# Patient Record
Sex: Male | Born: 1960 | Race: Black or African American | Hispanic: No | Smoking: Former smoker
Health system: Southern US, Community
[De-identification: ages and names within clinical notes are randomized; demographics above are authoritative.]

## PROBLEM LIST (undated history)

## (undated) DIAGNOSIS — Z803 Family history of malignant neoplasm of breast: Secondary | ICD-10-CM

## (undated) DIAGNOSIS — I219 Acute myocardial infarction, unspecified: Secondary | ICD-10-CM

## (undated) DIAGNOSIS — D649 Anemia, unspecified: Secondary | ICD-10-CM

## (undated) DIAGNOSIS — I10 Essential (primary) hypertension: Secondary | ICD-10-CM

## (undated) DIAGNOSIS — C61 Malignant neoplasm of prostate: Secondary | ICD-10-CM

## (undated) DIAGNOSIS — E119 Type 2 diabetes mellitus without complications: Secondary | ICD-10-CM

## (undated) HISTORY — PX: DENTAL SURGERY: SHX609

## (undated) HISTORY — DX: Malignant neoplasm of prostate: C61

## (undated) HISTORY — DX: Family history of malignant neoplasm of breast: Z80.3

---

## 1985-05-02 DIAGNOSIS — E119 Type 2 diabetes mellitus without complications: Secondary | ICD-10-CM

## 1985-05-02 HISTORY — DX: Type 2 diabetes mellitus without complications: E11.9

## 2009-04-19 ENCOUNTER — Emergency Department (HOSPITAL_COMMUNITY): Admission: EM | Admit: 2009-04-19 | Discharge: 2009-04-19 | Payer: Self-pay | Admitting: Emergency Medicine

## 2010-03-30 ENCOUNTER — Emergency Department (HOSPITAL_COMMUNITY): Admission: EM | Admit: 2010-03-30 | Discharge: 2010-03-31 | Payer: Self-pay | Admitting: Emergency Medicine

## 2010-07-14 LAB — POCT I-STAT, CHEM 8
Calcium, Ion: 1.14 mmol/L (ref 1.12–1.32)
Creatinine, Ser: 1.3 mg/dL (ref 0.4–1.5)
Glucose, Bld: 139 mg/dL — ABNORMAL HIGH (ref 70–99)
Hemoglobin: 15.6 g/dL (ref 13.0–17.0)
TCO2: 27 mmol/L (ref 0–100)

## 2020-05-03 ENCOUNTER — Emergency Department (HOSPITAL_COMMUNITY)
Admission: EM | Admit: 2020-05-03 | Discharge: 2020-05-03 | Disposition: A | Discharge status: Home / Self Care | Payer: BC Managed Care – PPO | Attending: Emergency Medicine | Admitting: Emergency Medicine

## 2020-05-03 ENCOUNTER — Other Ambulatory Visit: Payer: Self-pay

## 2020-05-03 ENCOUNTER — Encounter (HOSPITAL_COMMUNITY): Payer: Self-pay | Admitting: Emergency Medicine

## 2020-05-03 DIAGNOSIS — R109 Unspecified abdominal pain: Secondary | ICD-10-CM | POA: Diagnosis not present

## 2020-05-03 DIAGNOSIS — Z20822 Contact with and (suspected) exposure to covid-19: Secondary | ICD-10-CM | POA: Diagnosis not present

## 2020-05-03 DIAGNOSIS — I10 Essential (primary) hypertension: Secondary | ICD-10-CM | POA: Insufficient documentation

## 2020-05-03 DIAGNOSIS — R112 Nausea with vomiting, unspecified: Secondary | ICD-10-CM | POA: Diagnosis not present

## 2020-05-03 DIAGNOSIS — R739 Hyperglycemia, unspecified: Secondary | ICD-10-CM | POA: Insufficient documentation

## 2020-05-03 DIAGNOSIS — R000 Tachycardia, unspecified: Secondary | ICD-10-CM | POA: Diagnosis not present

## 2020-05-03 HISTORY — DX: Essential (primary) hypertension: I10

## 2020-05-03 LAB — URINALYSIS, ROUTINE W REFLEX MICROSCOPIC
Bacteria, UA: NONE SEEN
Bilirubin Urine: NEGATIVE
Glucose, UA: >=500 mg/dL — AB
Hgb urine dipstick: NEGATIVE
Ketones, ur: 80 mg/dL — AB
Leukocytes,Ua: NEGATIVE
Nitrite: NEGATIVE
Protein, ur: NEGATIVE mg/dL
Specific Gravity, Urine: 1.022 (ref 1.005–1.030)
pH: 6 (ref 5.0–8.0)

## 2020-05-03 LAB — COMPREHENSIVE METABOLIC PANEL
ALT: 21 U/L (ref 0–44)
AST: 15 U/L (ref 15–41)
Albumin: 3.1 g/dL — ABNORMAL LOW (ref 3.5–5.0)
Alkaline Phosphatase: 35 U/L — ABNORMAL LOW (ref 38–126)
Anion gap: 10 (ref 5–15)
BUN: 10 mg/dL (ref 6–20)
CO2: 24 mmol/L (ref 22–32)
Calcium: 8.8 mg/dL — ABNORMAL LOW (ref 8.9–10.3)
Chloride: 97 mmol/L — ABNORMAL LOW (ref 98–111)
Creatinine, Ser: 0.86 mg/dL (ref 0.61–1.24)
GFR, Estimated: >60 mL/min (ref >60)
Glucose, Bld: 261 mg/dL — ABNORMAL HIGH (ref 70–99)
Potassium: 4.3 mmol/L (ref 3.5–5.1)
Sodium: 131 mmol/L — ABNORMAL LOW (ref 135–145)
Total Bilirubin: 0.6 mg/dL (ref 0.3–1.2)
Total Protein: 7.4 g/dL (ref 6.5–8.1)

## 2020-05-03 LAB — CBC
HCT: 38.8 % — ABNORMAL LOW (ref 39.0–52.0)
Hemoglobin: 12.9 g/dL — ABNORMAL LOW (ref 13.0–17.0)
MCH: 28.5 pg (ref 26.0–34.0)
MCHC: 33.2 g/dL (ref 30.0–36.0)
MCV: 85.8 fL (ref 80.0–100.0)
Platelets: 434 10*3/uL — ABNORMAL HIGH (ref 150–400)
RBC: 4.52 MIL/uL (ref 4.22–5.81)
RDW: 12.2 % (ref 11.5–15.5)
WBC: 14.1 10*3/uL — ABNORMAL HIGH (ref 4.0–10.5)
nRBC: 0 % (ref 0.0–0.2)

## 2020-05-03 LAB — LIPASE, BLOOD: Lipase: 18 U/L (ref 11–51)

## 2020-05-03 LAB — POC SARS CORONAVIRUS 2 AG -  ED: SARS Coronavirus 2 Ag: NEGATIVE

## 2020-05-03 MED ORDER — PROMETHAZINE HCL 25 MG/ML IJ SOLN
25.0000 mg | Freq: Once | INTRAMUSCULAR | Status: AC
  Administered 2020-05-03: 25 mg via INTRAVENOUS
  Filled 2020-05-03: qty 1

## 2020-05-03 MED ORDER — ONDANSETRON 8 MG PO TBDP: 8.0000 mg | ORAL_TABLET | Freq: Three times a day (TID) | ORAL | 0 refills | Status: DC | PRN

## 2020-05-03 MED ORDER — ONDANSETRON 4 MG PO TBDP
4.0000 mg | ORAL_TABLET | Freq: Once | ORAL | Status: AC | PRN
  Administered 2020-05-03: 4 mg via ORAL

## 2020-05-03 MED ORDER — SODIUM CHLORIDE 0.9 % IV BOLUS
2000.0000 mL | Freq: Once | INTRAVENOUS | Status: AC
  Administered 2020-05-03: 2000 mL via INTRAVENOUS

## 2020-05-03 MED ORDER — METFORMIN HCL 500 MG PO TABS: 500.0000 mg | ORAL_TABLET | Freq: Two times a day (BID) | ORAL | 0 refills | Status: DC

## 2020-05-03 NOTE — ED Provider Notes (Signed)
[facility]-EMERGENCY DEPT Provider Note   CSN: [id number] Arrival date & time: 05/03/20  1125     History Chief Complaint  Patient presents with  . Abdominal Pain  . Emesis    Derek Thompson is a 60 y.o. male.  HPI 60 year old male presents today complaining of nausea and vomiting.  He states he has vomited multiple times since Friday evening.  Every time he attempts to eat he is vomiting.  He denies any dark emesis or bright red blood.  Patient states that on Tuesday and Wednesday he had some nasal congestion, cough, and scratchy throat.  He did not have a fever.  He felt better on Thursday that he continued to feel fatigued on Thursday and Friday.  Vomiting began Friday evening.  He denies any abdominal pain, fever, or diarrhea.  He has not had similar symptoms in the past.  He has not taken any interventions.  Denies any base at worst is eating.  He has not drank alcohol for the past 7 years.    Past Medical History:  Diagnosis Date  . Hypertension     There are no problems to display for this patient.   History reviewed. No pertinent surgical history.     History reviewed. No pertinent family history.  Social History   Tobacco Use  . Smoking status: Never Smoker  . Smokeless tobacco: Never Used  Substance Use Topics  . Alcohol use: Not Currently  . Drug use: Not Currently    Home Medications Prior to Admission medications   Not on File    Allergies    Patient has no known allergies.  Review of Systems   Review of Systems  All other systems reviewed and are negative.   Physical Exam Updated Vital Signs BP (!) 166/99 (BP Location: Left Arm)   Pulse (!) 115   Temp 99.2 F (37.3 C) (Oral)   Resp 16   Ht 1.829 m (6')   Wt 86.2 kg   SpO2 93%   BMI 25.77 kg/m   Physical Exam Constitutional:      Appearance: He is well-developed.  HENT:     Head: Normocephalic and atraumatic.     Mouth/Throat:     Mouth: Mucous membranes are  moist.     Pharynx: Oropharynx is clear.  Eyes:     Extraocular Movements: Extraocular movements intact.  Cardiovascular:     Rate and Rhythm: Regular rhythm. Tachycardia present.     Heart sounds: Normal heart sounds.  Pulmonary:     Effort: Pulmonary effort is normal.  Abdominal:     General: Abdomen is flat. Bowel sounds are normal.     Palpations: Abdomen is soft.     Tenderness: There is no abdominal tenderness.  Skin:    General: Skin is warm and dry.     Capillary Refill: Capillary refill takes less than 2 seconds.  Neurological:     General: No focal deficit present.     Mental Status: He is alert.  Psychiatric:        Mood and Affect: Mood normal.     ED Results / Procedures / Treatments   Labs (all labs ordered are listed, but only abnormal results are displayed) Labs Reviewed  COMPREHENSIVE METABOLIC PANEL - Abnormal; Notable for the following components:      Result Value   Sodium 131 (*)    Chloride 97 (*)    Glucose, Bld 261 (*)    Calcium 8.8 (*)  Albumin 3.1 (*)    Alkaline Phosphatase 35 (*)    All other components within normal limits  CBC - Abnormal; Notable for the following components:   WBC 14.1 (*)    Hemoglobin 12.9 (*)    HCT 38.8 (*)    Platelets 434 (*)    All other components within normal limits  URINALYSIS, ROUTINE W REFLEX MICROSCOPIC - Abnormal; Notable for the following components:   Glucose, UA >=500 (*)    Ketones, ur 80 (*)    All other components within normal limits  LIPASE, BLOOD  POC SARS CORONAVIRUS 2 AG -  ED    EKG None  Radiology No results found.  Procedures Procedures (including critical care time)  Medications Ordered in ED Medications  sodium chloride 0.9 % bolus 2,000 mL (has no administration in time range)  promethazine (PHENERGAN) injection 25 mg (has no administration in time range)  ondansetron (ZOFRAN-ODT) disintegrating tablet 4 mg (4 mg Oral Given 05/03/20 1139)    ED Course  I have reviewed  the triage vital signs and the nursing notes.  Pertinent labs & imaging results that were available during my care of the patient were reviewed by me and considered in my medical decision making (see chart for details).    MDM Rules/Calculators/A&P                          59 yo male with nausea and vomiting presents with tachycardia but no other s/s on exam.  Abdomen soft and nttp.  Labs essentially normal except hyperglycemia  Patient reports he was on oral meds for t2dm and hypertension but has not taken them for some time.  Will d/c with rx for antiemetics, metformin and advised re f/u for nausea and vomiting if any worsening, dm and hypertension.  HR down to 110.  Patient tolerating po.  Discussed return precautions and need for follow-up and patient voiced understanding Final Clinical Impression(s) / ED Diagnoses Final diagnoses:  Nausea and vomiting, intractability of vomiting not specified, unspecified vomiting type  Hyperglycemia  Hypertension, unspecified type    Rx / DC Orders ED Discharge Orders    None       Pattricia Boss, MD 05/03/20 2208

## 2020-05-03 NOTE — ED Triage Notes (Signed)
Patient states he has had abdominal pain, fatigue and emesis x2-3 days. Denies body aches, or fevers. Endorses small loose stools, but decreased oral intake.

## 2020-05-22 ENCOUNTER — Other Ambulatory Visit: Payer: Self-pay

## 2020-05-22 ENCOUNTER — Emergency Department (HOSPITAL_BASED_OUTPATIENT_CLINIC_OR_DEPARTMENT_OTHER): Payer: BC Managed Care – PPO

## 2020-05-22 ENCOUNTER — Encounter (HOSPITAL_BASED_OUTPATIENT_CLINIC_OR_DEPARTMENT_OTHER): Payer: Self-pay

## 2020-05-22 ENCOUNTER — Inpatient Hospital Stay (HOSPITAL_BASED_OUTPATIENT_CLINIC_OR_DEPARTMENT_OTHER)
Admission: EM | Admit: 2020-05-22 | Discharge: 2020-05-26 | DRG: 286 | Disposition: A | Discharge status: Home / Self Care | Payer: BC Managed Care – PPO | Attending: Internal Medicine | Admitting: Internal Medicine

## 2020-05-22 DIAGNOSIS — J90 Pleural effusion, not elsewhere classified: Secondary | ICD-10-CM

## 2020-05-22 DIAGNOSIS — I471 Supraventricular tachycardia: Secondary | ICD-10-CM | POA: Diagnosis present

## 2020-05-22 DIAGNOSIS — R0600 Dyspnea, unspecified: Principal | ICD-10-CM

## 2020-05-22 DIAGNOSIS — I5041 Acute combined systolic (congestive) and diastolic (congestive) heart failure: Secondary | ICD-10-CM | POA: Diagnosis present

## 2020-05-22 DIAGNOSIS — E1159 Type 2 diabetes mellitus with other circulatory complications: Secondary | ICD-10-CM | POA: Diagnosis not present

## 2020-05-22 DIAGNOSIS — Z23 Encounter for immunization: Secondary | ICD-10-CM | POA: Diagnosis not present

## 2020-05-22 DIAGNOSIS — I501 Left ventricular failure: Secondary | ICD-10-CM | POA: Diagnosis present

## 2020-05-22 DIAGNOSIS — I110 Hypertensive heart disease with heart failure: Secondary | ICD-10-CM | POA: Diagnosis present

## 2020-05-22 DIAGNOSIS — R0609 Other forms of dyspnea: Secondary | ICD-10-CM

## 2020-05-22 DIAGNOSIS — I5023 Acute on chronic systolic (congestive) heart failure: Secondary | ICD-10-CM

## 2020-05-22 DIAGNOSIS — Z7984 Long term (current) use of oral hypoglycemic drugs: Secondary | ICD-10-CM

## 2020-05-22 DIAGNOSIS — R0602 Shortness of breath: Secondary | ICD-10-CM

## 2020-05-22 DIAGNOSIS — F1021 Alcohol dependence, in remission: Secondary | ICD-10-CM | POA: Diagnosis present

## 2020-05-22 DIAGNOSIS — E441 Mild protein-calorie malnutrition: Secondary | ICD-10-CM | POA: Diagnosis present

## 2020-05-22 DIAGNOSIS — I5021 Acute systolic (congestive) heart failure: Secondary | ICD-10-CM | POA: Diagnosis not present

## 2020-05-22 DIAGNOSIS — J9601 Acute respiratory failure with hypoxia: Secondary | ICD-10-CM | POA: Diagnosis present

## 2020-05-22 DIAGNOSIS — I509 Heart failure, unspecified: Secondary | ICD-10-CM

## 2020-05-22 DIAGNOSIS — I5022 Chronic systolic (congestive) heart failure: Secondary | ICD-10-CM

## 2020-05-22 DIAGNOSIS — I447 Left bundle-branch block, unspecified: Secondary | ICD-10-CM | POA: Diagnosis present

## 2020-05-22 DIAGNOSIS — Z20822 Contact with and (suspected) exposure to covid-19: Secondary | ICD-10-CM | POA: Diagnosis present

## 2020-05-22 DIAGNOSIS — Z87891 Personal history of nicotine dependence: Secondary | ICD-10-CM | POA: Diagnosis not present

## 2020-05-22 DIAGNOSIS — I2510 Atherosclerotic heart disease of native coronary artery without angina pectoris: Secondary | ICD-10-CM | POA: Diagnosis present

## 2020-05-22 DIAGNOSIS — I5042 Chronic combined systolic (congestive) and diastolic (congestive) heart failure: Secondary | ICD-10-CM

## 2020-05-22 DIAGNOSIS — E861 Hypovolemia: Secondary | ICD-10-CM | POA: Diagnosis not present

## 2020-05-22 DIAGNOSIS — E871 Hypo-osmolality and hyponatremia: Secondary | ICD-10-CM | POA: Diagnosis not present

## 2020-05-22 DIAGNOSIS — E1165 Type 2 diabetes mellitus with hyperglycemia: Secondary | ICD-10-CM

## 2020-05-22 DIAGNOSIS — E1100 Type 2 diabetes mellitus with hyperosmolarity without nonketotic hyperglycemic-hyperosmolar coma (NKHHC): Secondary | ICD-10-CM

## 2020-05-22 DIAGNOSIS — Z6824 Body mass index (BMI) 24.0-24.9, adult: Secondary | ICD-10-CM | POA: Diagnosis not present

## 2020-05-22 DIAGNOSIS — Z800 Family history of malignant neoplasm of digestive organs: Secondary | ICD-10-CM | POA: Diagnosis not present

## 2020-05-22 DIAGNOSIS — I428 Other cardiomyopathies: Secondary | ICD-10-CM | POA: Diagnosis not present

## 2020-05-22 DIAGNOSIS — I5031 Acute diastolic (congestive) heart failure: Secondary | ICD-10-CM

## 2020-05-22 DIAGNOSIS — I10 Essential (primary) hypertension: Secondary | ICD-10-CM | POA: Diagnosis not present

## 2020-05-22 DIAGNOSIS — D638 Anemia in other chronic diseases classified elsewhere: Secondary | ICD-10-CM | POA: Diagnosis present

## 2020-05-22 DIAGNOSIS — I5020 Unspecified systolic (congestive) heart failure: Secondary | ICD-10-CM | POA: Diagnosis not present

## 2020-05-22 DIAGNOSIS — E876 Hypokalemia: Secondary | ICD-10-CM | POA: Diagnosis not present

## 2020-05-22 DIAGNOSIS — E118 Type 2 diabetes mellitus with unspecified complications: Secondary | ICD-10-CM | POA: Diagnosis not present

## 2020-05-22 DIAGNOSIS — R55 Syncope and collapse: Secondary | ICD-10-CM | POA: Diagnosis not present

## 2020-05-22 DIAGNOSIS — I472 Ventricular tachycardia: Secondary | ICD-10-CM | POA: Diagnosis not present

## 2020-05-22 DIAGNOSIS — I420 Dilated cardiomyopathy: Secondary | ICD-10-CM | POA: Diagnosis not present

## 2020-05-22 DIAGNOSIS — R0902 Hypoxemia: Secondary | ICD-10-CM

## 2020-05-22 DIAGNOSIS — E119 Type 2 diabetes mellitus without complications: Secondary | ICD-10-CM | POA: Diagnosis present

## 2020-05-22 DIAGNOSIS — E785 Hyperlipidemia, unspecified: Secondary | ICD-10-CM | POA: Diagnosis not present

## 2020-05-22 DIAGNOSIS — Z803 Family history of malignant neoplasm of breast: Secondary | ICD-10-CM

## 2020-05-22 DIAGNOSIS — I152 Hypertension secondary to endocrine disorders: Secondary | ICD-10-CM | POA: Diagnosis not present

## 2020-05-22 DIAGNOSIS — E1169 Type 2 diabetes mellitus with other specified complication: Secondary | ICD-10-CM | POA: Diagnosis not present

## 2020-05-22 HISTORY — DX: Type 2 diabetes mellitus without complications: E11.9

## 2020-05-22 LAB — COMPREHENSIVE METABOLIC PANEL
ALT: 17 U/L (ref 0–44)
AST: 14 U/L — ABNORMAL LOW (ref 15–41)
Albumin: 3.1 g/dL — ABNORMAL LOW (ref 3.5–5.0)
Alkaline Phosphatase: 34 U/L — ABNORMAL LOW (ref 38–126)
Anion gap: 10 (ref 5–15)
BUN: 13 mg/dL (ref 6–20)
CO2: 25 mmol/L (ref 22–32)
Calcium: 9.1 mg/dL (ref 8.9–10.3)
Chloride: 99 mmol/L (ref 98–111)
Creatinine, Ser: 0.84 mg/dL (ref 0.61–1.24)
GFR, Estimated: >60 mL/min (ref >60)
Glucose, Bld: 169 mg/dL — ABNORMAL HIGH (ref 70–99)
Potassium: 4.3 mmol/L (ref 3.5–5.1)
Sodium: 134 mmol/L — ABNORMAL LOW (ref 135–145)
Total Bilirubin: 0.1 mg/dL — ABNORMAL LOW (ref 0.3–1.2)
Total Protein: 7.2 g/dL (ref 6.5–8.1)

## 2020-05-22 LAB — D-DIMER, QUANTITATIVE: D-Dimer, Quant: 0.86 ug/mL-FEU — ABNORMAL HIGH (ref 0.00–0.50)

## 2020-05-22 LAB — CBC WITH DIFFERENTIAL/PLATELET
Abs Immature Granulocytes: 0.03 10*3/uL (ref 0.00–0.07)
Basophils Absolute: 0 10*3/uL (ref 0.0–0.1)
Basophils Relative: 0 %
Eosinophils Absolute: 0.1 10*3/uL (ref 0.0–0.5)
Eosinophils Relative: 1 %
HCT: 35.8 % — ABNORMAL LOW (ref 39.0–52.0)
Hemoglobin: 11.6 g/dL — ABNORMAL LOW (ref 13.0–17.0)
Immature Granulocytes: 0 %
Lymphocytes Relative: 26 %
Lymphs Abs: 2.5 10*3/uL (ref 0.7–4.0)
MCH: 28.7 pg (ref 26.0–34.0)
MCHC: 32.4 g/dL (ref 30.0–36.0)
MCV: 88.6 fL (ref 80.0–100.0)
Monocytes Absolute: 0.7 10*3/uL (ref 0.1–1.0)
Monocytes Relative: 7 %
Neutro Abs: 6.3 10*3/uL (ref 1.7–7.7)
Neutrophils Relative %: 66 %
Platelets: 355 10*3/uL (ref 150–400)
RBC: 4.04 MIL/uL — ABNORMAL LOW (ref 4.22–5.81)
RDW: 12.7 % (ref 11.5–15.5)
WBC: 9.6 10*3/uL (ref 4.0–10.5)
nRBC: 0 % (ref 0.0–0.2)

## 2020-05-22 LAB — GLUCOSE, CAPILLARY
Glucose-Capillary: 152 mg/dL — ABNORMAL HIGH (ref 70–99)
Glucose-Capillary: 139 mg/dL — ABNORMAL HIGH (ref 70–99)

## 2020-05-22 LAB — TROPONIN I (HIGH SENSITIVITY)
Troponin I (High Sensitivity): 12 ng/L (ref <18)
Troponin I (High Sensitivity): 12 ng/L (ref <18)

## 2020-05-22 LAB — BRAIN NATRIURETIC PEPTIDE: B Natriuretic Peptide: 636.6 pg/mL — ABNORMAL HIGH (ref 0.0–100.0)

## 2020-05-22 LAB — SARS CORONAVIRUS 2 BY RT PCR (HOSPITAL ORDER, PERFORMED IN ~~LOC~~ HOSPITAL LAB): SARS Coronavirus 2: NEGATIVE

## 2020-05-22 LAB — HIV ANTIBODY (ROUTINE TESTING W REFLEX): HIV Screen 4th Generation wRfx: NONREACTIVE

## 2020-05-22 MED ORDER — ACETAMINOPHEN 325 MG PO TABS: 650.0000 mg | ORAL_TABLET | Freq: Four times a day (QID) | ORAL | Status: DC | PRN

## 2020-05-22 MED ORDER — ONDANSETRON HCL 4 MG PO TABS: 4.0000 mg | ORAL_TABLET | Freq: Four times a day (QID) | ORAL | Status: DC | PRN

## 2020-05-22 MED ORDER — FUROSEMIDE 10 MG/ML IJ SOLN
40.0000 mg | Freq: Once | INTRAMUSCULAR | Status: AC
  Administered 2020-05-22: 40 mg via INTRAVENOUS
  Filled 2020-05-22: qty 4

## 2020-05-22 MED ORDER — INSULIN ASPART 100 UNIT/ML ~~LOC~~ SOLN
0.0000 [IU] | Freq: Every day | SUBCUTANEOUS | Status: DC
  Administered 2020-05-23: 2 [IU] via SUBCUTANEOUS

## 2020-05-22 MED ORDER — MORPHINE SULFATE (PF) 2 MG/ML IV SOLN: 2.0000 mg | INTRAVENOUS | Status: DC | PRN

## 2020-05-22 MED ORDER — ONDANSETRON HCL 4 MG/2ML IJ SOLN: 4.0000 mg | Freq: Four times a day (QID) | INTRAMUSCULAR | Status: DC | PRN

## 2020-05-22 MED ORDER — ACETAMINOPHEN 650 MG RE SUPP: 650.0000 mg | Freq: Four times a day (QID) | RECTAL | Status: DC | PRN

## 2020-05-22 MED ORDER — OXYCODONE HCL 5 MG PO TABS: 5.0000 mg | ORAL_TABLET | ORAL | Status: DC | PRN

## 2020-05-22 MED ORDER — HEPARIN SODIUM (PORCINE) 5000 UNIT/ML IJ SOLN
5000.0000 [IU] | Freq: Three times a day (TID) | INTRAMUSCULAR | Status: DC
  Administered 2020-05-22 – 2020-05-25 (×8): 5000 [IU] via SUBCUTANEOUS
  Filled 2020-05-22 (×8): qty 1

## 2020-05-22 MED ORDER — FUROSEMIDE 10 MG/ML IJ SOLN: 40.0000 mg | Freq: Two times a day (BID) | INTRAMUSCULAR | Status: DC

## 2020-05-22 MED ORDER — NITROGLYCERIN 2 % TD OINT
1.0000 [in_us] | TOPICAL_OINTMENT | Freq: Four times a day (QID) | TRANSDERMAL | Status: DC
  Administered 2020-05-23 – 2020-05-26 (×14): 1 [in_us] via TOPICAL
  Filled 2020-05-22 (×2): qty 30

## 2020-05-22 MED ORDER — IOHEXOL 350 MG/ML SOLN
100.0000 mL | Freq: Once | INTRAVENOUS | Status: AC | PRN
  Administered 2020-05-22: 100 mL via INTRAVENOUS

## 2020-05-22 MED ORDER — INSULIN ASPART 100 UNIT/ML ~~LOC~~ SOLN
0.0000 [IU] | Freq: Three times a day (TID) | SUBCUTANEOUS | Status: DC
  Administered 2020-05-23: 13:00:00 5 [IU] via SUBCUTANEOUS
  Administered 2020-05-24: 3 [IU] via SUBCUTANEOUS
  Administered 2020-05-24 (×2): 2 [IU] via SUBCUTANEOUS
  Administered 2020-05-25: 5 [IU] via SUBCUTANEOUS
  Administered 2020-05-25 – 2020-05-26 (×3): 3 [IU] via SUBCUTANEOUS
  Administered 2020-05-26: 5 [IU] via SUBCUTANEOUS

## 2020-05-22 MED ORDER — POLYETHYLENE GLYCOL 3350 17 G PO PACK: 17.0000 g | PACK | Freq: Every day | ORAL | Status: DC | PRN

## 2020-05-22 MED ORDER — NITROGLYCERIN 2 % TD OINT
1.0000 [in_us] | TOPICAL_OINTMENT | Freq: Once | TRANSDERMAL | Status: AC
  Administered 2020-05-22: 1 [in_us] via TOPICAL
  Filled 2020-05-22: qty 1

## 2020-05-22 NOTE — ED Triage Notes (Signed)
Pt reports shortness of breath x1 week. Pt states around christmas he mixed chemicals while cleaning and after inhaling the chemicals he went to the ER. Pt states then he developed shortness of breath and cough. Pt states he had an xray yesterday and his pcp said he had covid PNA. Pt 93% ambulatory to ER room. Pt noted to be 94-96% while resting in bed. Pt reports syncopal episode this past week and had a fall.

## 2020-05-22 NOTE — ED Notes (Signed)
Upon returning from CT pt had increased SOB with O2% 88, dropping lower when coughing. Pt placed on 2L [place], RT and MD made aware.

## 2020-05-22 NOTE — Consult Note (Signed)
Cardiology Consultation:   Patient ID: Derek Thompson MRN: [id number]; DOB: Feb 10, 1961  Admit date: 05/22/2020 Date of Consult: 05/22/2020  Primary Care Provider: Patient, No Pcp Per [place] Cardiologist: No primary care provider on file. new Dr. [facility] Electrophysiologist:  None    Patient Profile:   Derek Thompson is a 60 y.o. male with a hx of DM and HTN but off mds for some time who is being seen today for the evaluation of CHF at the request of Dr. Jamse Arn.  History of Present Illness:   Derek Thompson with HTN and DM but off meds for some time was seen in ER 05/03/20 for abd pain and N/V.  Glucose was up -261and given metformin along with antiemetics. Pulse was 115 BP 169/99.    Today he presented to med center [facility] with SOB for 1 week, he thought related to chemicals he had mixed after Christmas. After exposed to chemicals he coughed a lot.   He also had syncopal episode this past week. He got out of truck to get gas and at end of truck went out - just for seconds but was on ground.  Not dizzy   His PCP did CXR yesterday and told pt he had COVID PNA.  His COVID test here is neg.   WBC 9.6, Hgb 11.6 plts 355.   BNP 636  Hs troponin 12 and 12 DDimer 0.86 Na 134, K+ 4.3 glucose 169 BUN 13 Cr 9.84    EKG:  The EKG was personally reviewed and demonstrates:  ST at 119 with LBBB no old EKGs to compare.  Telemetry:  Telemetry was personally reviewed and demonstrates:  ST  No CXR but CT angio of chest without PE, bilateral large pleural effusion not felt to be COVID PNA , + coronary artery calcification and aortic atherosclerotic calcification.  He has rec' lasix 40 mg IV once and NTG paste applied, on BiPAP with diuresing but on arrival here BiPAp weaned off.  On 5L [place] sp02 90% now in ST.  He is neg 1200.   BP on arrival was 154/94 Resp 27 T 99  Now down to 143/88 resp 18 to 29  On 02 at 5 L now. Can speak   Past Medical History:  Diagnosis Date   Diabetes  mellitus without complication ([place])    Hypertension     History reviewed. No pertinent surgical history.   Home Medications:  Prior to Admission medications   Medication Sig Start Date End Date Taking? Authorizing Provider  metFORMIN (GLUCOPHAGE) 500 MG tablet Take 1 tablet (500 mg total) by mouth 2 (two) times daily with a meal. 05/03/20   Pattricia Boss, MD  ondansetron (ZOFRAN ODT) 8 MG disintegrating tablet Take 1 tablet (8 mg total) by mouth every 8 (eight) hours as needed for nausea or vomiting. 05/03/20   Pattricia Boss, MD    Inpatient Medications: Scheduled Meds:  Continuous Infusions:  PRN Meds:   Allergies:   No Known Allergies  Social History:   Social History   Socioeconomic History   Marital status: Divorced    Spouse name: Not on file   Number of children: Not on file   Years of education: Not on file   Highest education level: Not on file  Occupational History   Not on file  Tobacco Use   Smoking status: Former Smoker    Packs/day: 1.00    Years: 43.00    Pack years: 43.00    Quit date:  05/22/2017    Years since quitting: 3.0   Smokeless tobacco: Never Used  Substance and Sexual Activity   Alcohol use: Not Currently    Comment: last drink 2015   Drug use: Not Currently   Sexual activity: Not on file  Other Topics Concern   Not on file  Social History Narrative   Not on file   Social Determinants of Health   Financial Resource Strain: Not on file  Food Insecurity: Not on file  Transportation Needs: Not on file  Physical Activity: Not on file  Stress: Not on file  Social Connections: Not on file  Intimate Partner Violence: Not on file    Family History:    Family History  Problem Relation Age of Onset   Cancer Mother    Cancer Father      ROS:  Please see the history of present illness.  General:no colds or fevers, no weight changes Skin:no rashes or ulcers HEENT:no blurred vision, no congestion CV:see HPI PUL:see  HPI GI:no diarrhea constipation or melena, no indigestion GU:no hematuria, no dysuria MS:no joint pain, no claudication Neuro:no syncope, no lightheadedness Endo:+ diabetes, no thyroid disease  All other ROS reviewed and negative.     Physical Exam/Data:   Vitals:   05/22/20 1630 05/22/20 1700 05/22/20 1730 05/22/20 1736  BP: 132/77 (!) 139/94 (!) 134/109 (!) 135/93  Pulse: (!) 116 (!) 111 (!) 122 (!) 114  Resp: (!) 22  (!) 31 16  Temp:    98.6 F (37 C)  TempSrc:    Axillary  SpO2: 97% 98% 100% 99%  Weight:      Height:        Intake/Output Summary (Last 24 hours) at 05/22/2020 1850 Last data filed at 05/22/2020 1653 Gross per 24 hour  Intake --  Output 800 ml  Net -800 ml   Last 3 Weights 05/22/2020 05/03/2020  Weight (lbs) 180 lb 190 lb  Weight (kg) 81.647 kg 86.183 kg     Body mass index is 24.41 kg/m.  General:  Well nourished, well developed, in no acute distress mild SOB  HEENT: normal Lymph: no adenopathy Neck: no JVD Endocrine:  No thryomegaly Vascular: No carotid bruits; FA pulses 2+ bilaterally without bruits  Cardiac:  normal S1, S2; RRR; no murmur gallup rub or click Lungs:  diminished bottom third of both bases  to auscultation bilaterally, no wheezing, rhonchi few rales  Abd: soft, nontender, no hepatomegaly  Ext: no edema Musculoskeletal:  No deformities, BUE and BLE strength normal and equal Skin: warm and dry  Neuro:  Alert and oriented X 3 MAE follows commands, answers qustions approp, no focal abnormalities noted Psych:  Normal affect     Relevant CV Studies: none  Laboratory Data:  High Sensitivity Troponin:   Recent Labs  Lab 05/22/20 1208 05/22/20 1426  TROPONINIHS 12 12     Chemistry Recent Labs  Lab 05/22/20 1208  NA 134*  K 4.3  CL 99  CO2 25  GLUCOSE 169*  BUN 13  CREATININE 0.84  CALCIUM 9.1  GFRNONAA >60  ANIONGAP 10    Recent Labs  Lab 05/22/20 1208  PROT 7.2  ALBUMIN 3.1*  AST 14*  ALT 17  ALKPHOS 34*   BILITOT 0.1*   Hematology Recent Labs  Lab 05/22/20 1129  WBC 9.6  RBC 4.04*  HGB 11.6*  HCT 35.8*  MCV 88.6  MCH 28.7  MCHC 32.4  RDW 12.7  PLT 355   BNP Recent Labs  Lab 05/22/20 1129  BNP 636.6*    DDimer  Recent Labs  Lab 05/22/20 1208  DDIMER 0.86*     Radiology/Studies:  CT Angio Chest PE W/Cm &/Or Wo Cm  Result Date: 05/22/2020 CLINICAL DATA:  NEG COVID test today  D Dimer 0.86  Per chart: Pt reports shortness of breath x1 week. Pt states around christmas he mixed chemicals while cleaning. Chemical exposure EXAM: CT ANGIOGRAPHY CHEST WITH CONTRAST TECHNIQUE: Multidetector CT imaging of the chest was performed using the standard protocol during bolus administration of intravenous contrast. Multiplanar CT image reconstructions and MIPs were obtained to evaluate the vascular anatomy. CONTRAST:  121mL OMNIPAQUE IOHEXOL 350 MG/ML SOLN COMPARISON:  None. FINDINGS: Cardiovascular: No filling defects within the pulmonary arteries to suggest acute pulmonary embolism. No pericardial effusion. Coronary artery calcification and aortic atherosclerotic calcification. Mediastinum/Nodes: There are calcified mediastinal lymph nodes. No lymphadenopathy Borderline enlarged paratracheal lymph nodes. Lungs/Pleura: Diffuse perihilar airspace disease with confluent ground-glass densities. Large bilateral layering pleural effusions. Upper Abdomen: Limited view of the liver, kidneys, pancreas are unremarkable. Normal adrenal glands. Musculoskeletal: No aggressive osseous lesion. Review of the MIP images confirms the above findings. IMPRESSION: 1. No evidence acute pulmonary embolism. 2. Diffuse severe perihilar airspace disease. Bilateral large pleural effusion. Typically COVID pneumonia does not have associated pleural effusions. Additionally COVID pneumonia is typically peripherally distributed. This central airspace disease is more favored flash pulmonary edema, inhalation toxicity (given  history), or atypical pneumonia. 3. Calcified mediastinal lymph nodes consistent with prior granulomatous disease. 4. Aortic Atherosclerosis (ICD10-I70.0). Electronically Signed   By: Suzy Bouchard M.D.   On: 05/22/2020 14:50     Assessment and Plan:   1. Acute CHF with large pl effusions. Not felt to be COVID PNA - neg COVID test--? Viral component with N/V/D 05/03/20 and treated in EF HR was 111 at that time. He mixed some chemicals around Christmas.  Continue to IV diuresis 40 mg BID and may need thoracentesis  Keep K+ 4.0 or greater Mg 2.0 and will need Echo. No mention of cardiomegaly on CT of chest.  Does mention CAD.   With HTN and DM he is at high risk for CAD.  2. HTN needs improved control with tachycardia BB would be nice but with CHF would begin with ARB  3. DM  Per IM had been off metformin for some time. 4. D-dimer elevated but neg CTA of chest for PE   Risk Assessment/Risk Scores:        New York Heart Association (NYHA) Functional Class NYHA Class III        For questions or updates, please contact [facility] Please consult [email or web address] for contact info under    Signed, Cecilie Kicks, NP  05/22/2020 6:50 PM

## 2020-05-22 NOTE — ED Notes (Signed)
Trop delayed r/t original draw time

## 2020-05-22 NOTE — ED Notes (Signed)
Pt given TV dinner, crackers, and peanut butter, with drink.

## 2020-05-22 NOTE — Plan of Care (Signed)
Patient excepted for progressive bed from [place].  60 year old man with increasing shortness of breath x1 week.  Work-up suggestive of new onset decompensated heart failure.  No evidence of ischemia by EKG or troponins.  Patient placed on BiPAP while being diuresed.  Discussed with cardiology, they requested [facility] hospitalist to admit while they will follow.

## 2020-05-22 NOTE — ED Provider Notes (Addendum)
[facility] EMERGENCY DEPARTMENT Provider Note   CSN: [id number] Arrival date & time: 05/22/20  4098     History Chief Complaint  Patient presents with  . Shortness of Breath    Derek Thompson is a 60 y.o. male.  Patient reports that he has been experiencing increasing shortness of breath associated with dry cough for close to 1 month. He reports that he feels more SOB with exertion. 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  He states that since then he started to feel increasing fatigue, dry cough, nausea with emesis and shortness of breath.  He denies presence of chest pain or dyspnea.  Patient reports no abdominal pain but did have a period of increased urinary and bowel movement frequency.  He denies any fevers or chills but does report a few episodes of night sweats in the past month.  He reports that he has been vaccinated for COVID-19 and received his booster about 2 weeks ago.  Patient reports that he was feeling well when he had his booster vaccine.  He is unaware of any sick contacts.  Patient states that he was recently diagnosed with diabetes and had a recent negative COVID test on 05/04/2020. Reports that his father passed away from metastatic pancreatic cancer.  His mother had some 3 episodes of breast cancer.  In regards to his personal medical history, he denies any prior diagnoses of COPD or heart disease nor any hypertension or renal disease. 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  Patient is a former smoker and reports smoking 1 to 1.5 packs/day since age 56 up until age 89 (64.5 pack years).        Past Medical History:  Diagnosis Date  . Diabetes mellitus without complication ([place])   . Hypertension      History reviewed. No pertinent surgical  history.     Family History  Problem Relation Age of Onset  . Cancer Mother   . Cancer Father     Social History   Tobacco Use  . Smoking status: Former Smoker    Packs/day: 1.00    Years: 43.00    Pack years: 43.00    Quit date: 05/22/2017    Years since quitting: 3.0  . Smokeless tobacco: Never Used  Substance Use Topics  . Alcohol use: Not Currently    Comment: last drink 2015  . Drug use: Not Currently    Home Medications Prior to Admission medications   Medication Sig Start Date End Date Taking? Authorizing Provider  metFORMIN (GLUCOPHAGE) 500 MG tablet Take 1 tablet (500 mg total) by mouth 2 (two) times daily with a meal. 05/03/20   Pattricia Boss, MD  ondansetron (ZOFRAN ODT) 8 MG disintegrating tablet Take 1 tablet (8 mg total) by mouth every 8 (eight) hours as needed for nausea or vomiting. 05/03/20   Pattricia Boss, MD    Allergies    Patient has no known allergies.  Review of Systems   Review of Systems  Constitutional: Positive for appetite change and fatigue.  HENT: Negative for congestion, sinus pressure, sore throat and trouble swallowing.   Respiratory: Positive for cough and shortness of breath. Negative for choking, chest tightness and wheezing.   Cardiovascular: Negative for chest pain and palpitations.  Gastrointestinal: Positive for nausea and vomiting.  Negative for abdominal pain and diarrhea.  Genitourinary: Negative for frequency, hematuria and urgency.  Musculoskeletal: Negative for myalgias.  Neurological: Negative for headaches.    Physical Exam Updated Vital Signs BP (!) 165/102   Pulse (!) 117   Temp 99 F (37.2 C) (Oral)   Resp 17   Ht 6' (1.829 m)   Wt 81.6 kg   SpO2 94%   BMI 24.41 kg/m   Physical Exam Constitutional:      General: He is not in acute distress.    Appearance: He is normal weight. He is not ill-appearing, toxic-appearing or diaphoretic.  HENT:     Mouth/Throat:     Mouth: Mucous membranes are moist.      Pharynx: No oropharyngeal exudate.  Cardiovascular:     Rate and Rhythm: Regular rhythm. Tachycardia present.     Pulses: Normal pulses.     Heart sounds: No friction rub. Gallop present.   Pulmonary:     Effort: Pulmonary effort is normal. No accessory muscle usage or respiratory distress.     Breath sounds: No stridor. Examination of the right-lower field reveals decreased breath sounds. Examination of the left-lower field reveals decreased breath sounds. Decreased breath sounds present. No wheezing, rhonchi or rales.     Comments: Breathing comfortably on RA Chest:     Chest wall: No tenderness.  Abdominal:     General: Bowel sounds are normal.     Palpations: Abdomen is soft.     Tenderness: There is no abdominal tenderness.  Musculoskeletal:     Right lower leg: No tenderness. Edema present.     Left lower leg: No tenderness. Edema present.  Skin:    General: Skin is warm and dry.  Neurological:     Mental Status: He is alert and oriented to person, place, and time.     Motor: No weakness.     ED Results / Procedures / Treatments   Labs (all labs ordered are listed, but only abnormal results are displayed) Labs Reviewed  CBC WITH DIFFERENTIAL/PLATELET - Abnormal; Notable for the following components:      Result Value   RBC 4.04 (*)    Hemoglobin 11.6 (*)    HCT 35.8 (*)    All other components within normal limits  BRAIN NATRIURETIC PEPTIDE - Abnormal; Notable for the following components:   B Natriuretic Peptide 636.6 (*)    All other components within normal limits  COMPREHENSIVE METABOLIC PANEL - Abnormal; Notable for the following components:   Sodium 134 (*)    Glucose, Bld 169 (*)    Albumin 3.1 (*)    AST 14 (*)    Alkaline Phosphatase 34 (*)    Total Bilirubin 0.1 (*)    All other components within normal limits  D-DIMER, QUANTITATIVE (NOT AT [facility]) - Abnormal; Notable for the following components:   D-Dimer, Quant 0.86 (*)    All other components within  normal limits  SARS CORONAVIRUS 2 BY RT PCR (HOSPITAL ORDER, [facility])  TROPONIN I (HIGH SENSITIVITY)  TROPONIN I (HIGH SENSITIVITY)    EKG EKG Interpretation  Date/Time:  Friday May 22 2020 10:16:05 EST Ventricular Rate:  111 PR Interval:    QRS Duration: 134 QT Interval:  349 QTC Calculation: 475 R Axis:   69 Text Interpretation: Sinus tachycardia Probable left atrial enlargement Left bundle branch block Confirmed by Elnora Morrison [phone]) on 05/22/2020 11:20:48 AM  Radiology CT Angio Chest PE W/Cm &/Or Wo Cm  Result Date: 05/22/2020 CLINICAL DATA:  NEG COVID test today  D Dimer 0.86  Per chart: Pt reports shortness of breath x1 week. Pt states around christmas he mixed chemicals while cleaning. Chemical exposure EXAM: CT ANGIOGRAPHY CHEST WITH CONTRAST TECHNIQUE: Multidetector CT imaging of the chest was performed using the standard protocol during bolus administration of intravenous contrast. Multiplanar CT image reconstructions and MIPs were obtained to evaluate the vascular anatomy. CONTRAST:  170mL OMNIPAQUE IOHEXOL 350 MG/ML SOLN COMPARISON:  None. FINDINGS: Cardiovascular: No filling defects within the pulmonary arteries to suggest acute pulmonary embolism. No pericardial effusion. Coronary artery calcification and aortic atherosclerotic calcification. Mediastinum/Nodes: There are calcified mediastinal lymph nodes. No lymphadenopathy Borderline enlarged paratracheal lymph nodes. Lungs/Pleura: Diffuse perihilar airspace disease with confluent ground-glass densities. Large bilateral layering pleural effusions. Upper Abdomen: Limited view of the liver, kidneys, pancreas are unremarkable. Normal adrenal glands. Musculoskeletal: No aggressive osseous lesion. Review of the MIP images confirms the above findings. IMPRESSION: 1. No evidence acute pulmonary embolism. 2. Diffuse severe perihilar airspace disease. Bilateral large pleural effusion. Typically COVID  pneumonia does not have associated pleural effusions. Additionally COVID pneumonia is typically peripherally distributed. This central airspace disease is more favored flash pulmonary edema, inhalation toxicity (given history), or atypical pneumonia. 3. Calcified mediastinal lymph nodes consistent with prior granulomatous disease. 4. Aortic Atherosclerosis (ICD10-I70.0). Electronically Signed   By: Suzy Bouchard M.D.   On: 05/22/2020 14:50    Procedures Procedures (including critical care time)  Medications Ordered in ED Medications  nitroGLYCERIN (NITROGLYN) 2 % ointment 1 inch (has no administration in time range)  iohexol (OMNIPAQUE) 350 MG/ML injection 100 mL (100 mLs Intravenous Contrast Given 05/22/20 1404)  furosemide (LASIX) injection 40 mg (40 mg Intravenous Given 05/22/20 1513)    ED Course  I have reviewed the triage vital signs and the nursing notes.  Pertinent labs & imaging results that were available during my care of the patient were reviewed by me and considered in my medical decision making (see chart for details).  Clinical Course as of 05/22/20 1543  Fri May 22, 2020  1142 Patient evaluated with EKG showing LVH, LBBB and tachycardia. Persistent tachycardia, oxygen saturation 93-94% at rest. Hypertensive, improved from systolic 409 to 735. D-dimer, CMP, BNP, troponin and CBC ordered. COVID test ordered.  [MS]  3299 CBC with Differential(!) No elevated WBC, slightly low Hgb 11.6 [MS]  1215 Troponin I (High Sensitivity) [MS]  1215 Elevated d-dimer 0.86 with glucose 169,BNP elevated 636. COVID pending, initial trop 12  [MS]  1345 Discussed elevated d-dimer with patient and patient is agreeable to proceeding with CTA PE. Patient ok to eat and drink  [MS]  1505 SARS Coronavirus 2 by RT PCR (hospital order, performed in [place] hospital lab) Nasopharyngeal Nasopharyngeal Swab [MS]  1522 Discussed plan for admission due to acute hypoxic respiratory failure and new lung  findings on CT with bilateral pleural effusions and oxygen requirement, patient more labored breathing on exam, ordered nitroglycern paste and IV lasix 40 [MS]    Clinical Course User Index [MS] Simmons-Robinson, Ronne Stefanski, MD   MDM Rules/Calculators/A&P                          Lavaris Soward is a 60 y.o. male presenting with SOB for ~3 weeks with hx of newly diagnosed DM on metformin. Patient has EKG consistent with sinus tachycardia with likely left bundle branch block and LVH. Patient is hypertensive to 154/94, tachycardic(110) and tachypneic (  24). Recent outpatient CXR was completed and patient was informed that he had XR findings consistent with COVID PNA and was recommended to be tested in the ED by his PCP. Upon my initial evaluation, Derek Thompson is saturating 93-94% on RA and breathing comfortably, however, reports having SOB with exertion as well as dry cough. Most likely on differential is COVID infection, so patient is agreeable with testing today. Given hx of HTN and EKG findings in setting of new onset dry cough and SOB, patient may have undiagnosed HF so will check BNP as well as troponin in case patient is actively experiencing ischemia that is contributing to his symptoms. Due to tachycardia and SOB, will check D-dimer level as patient may have PE and if COVID +, this would raise concern for hypercoagulability.    BNP elevated at 636, D-dimer elevated 0.86, initial troponin level 12, COVID test negative. CTA completed due to elevated d-dimer and negative for acute PE, however notable for bilateral pleural effusions thought to be results of flash pulmonary edema from potential inhalation exposure or atypical PNA, results also also remarkable for granulomatous disease in perihilar space noted to be severe with borderline enlarged paratracheal lymph nodes.   Patient observed to have hypoxia after CT study down to 80s, repeat EKG was similar to initial presentation EKG. Tachycardia persist with  max 121. Will plan to start IV lasix, consult cardiology and consult  for admission to inpatient hospitialist service for diuresis and monitoring respiratory status for acute hypoxic respiratory failure likely secondary to new onset heart failure.  Discussed case with Dr. Gasper Sells from [facility] who has agreed to add patient to consult list to be seen for echocardiogram and further cardiovascular evaluation once admitted to hospitalist service.   Final Clinical Impression(s) / ED Diagnoses Final diagnoses:  Dyspnea on exertion  Shortness of breath  Pleural effusion, bilateral  Hypoxia    Rx / DC Orders ED Discharge Orders    None       Eulis Foster, MD 05/22/20 1533    Eulis Foster, MD 05/22/20 [id number]    Elnora Morrison, MD 05/22/20 1558

## 2020-05-22 NOTE — ED Notes (Signed)
Patient transported to CT 

## 2020-05-22 NOTE — H&P (Signed)
TRH H&P    Patient Demographics:    Derek Thompson, is a 60 y.o. male  MRN: [id number]  DOB - October 13, 1960  Admit Date - 05/22/2020  Referring MD/NP/PA: [facility]  Outpatient Primary MD for the patient is Patient, No Pcp Per  Patient coming from: Home  Chief complaint-dyspnea   HPI:    Derek Thompson  is a 60 y.o. male, with history of diabetes mellitus type 2, presents to the ED with a chief complaint of dyspnea.  Patient has had increasing shortness of breath for 1 week.  The shortness of breath has been worse with exertion, better with rest, and progressively worsening.  Patient reports orthopnea claiming that he wakes up in the night coughing if he lays flat and he has been having to sleep sitting up.  Patient does not admit to any recent weight gain, or peripheral edema.  Patient denies any chest pain, palpitations.  He does report that he recently had some indigestion, nausea, vomiting for which he was seen in the ER 2 weeks ago.  At that time he was diagnosed with diabetes and started on metformin.  Patient reports that he had also been told that he was diabetic about 20 years ago, but he lost weight and his glucose was diet controlled.  Patient denies any fevers, sick contacts.  He does report that he had an episode of dyspnea and coughing in December when he was mixing cleaning products together and the gas reaction caused him to become very short of breath and coughing.  It sounds like that head started to resolve when his new shortness of breath/cough/orthopnea started again.  Patient reports that he has had 1 syncopal episode and one near syncopal episode.  One was while walking from the parking lot to the building.  He felt like he was going to pass out, had to sit down and take a deep breath and then felt better.  The second was getting out of his truck to pump gas and he passed out next to the gas pump.  He reports  he was only out for a few seconds and a bystander said that they thought he just slipped and fell, but he reports he had passed out.  Patient is a former smoker he quit 3 years ago.  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  He does not use illicit drugs.  Patient is vaccinated for COVID with booster.  Patient is full code.  In the ED Patient has been tachycardic, blood pressures have been stable, his oxygen saturation dropped down to 87% and normalized with 2 L nasal cannula but he was then put on BiPAP for comfort.  He was given 40 mg of Lasix.  Upon arrival to [facility] he was weaned off BiPAP and feels comfortable on the 5 L nasal cannula. CT angio as discussed below showed pulmonary edema and pleural effusions. Cardiology was consulted from the ED, they have seen patient at bedside already and plan for echo in the a.m.     Review  of systems:    In addition to the HPI above,  No Fever-chills, No Headache, No changes with Vision or hearing, No problems swallowing food or Liquids, Denies chest pain palpitations, admits to cough and shortness of breath No Abdominal pain, No Nausea or Vomiting, bowel movements are regular, No Blood in stool or Urine, No dysuria, No new skin rashes or bruises, No new joints pains-aches,  No new weakness, tingling, numbness in any extremity, No recent weight gain or loss, Admits to polyphasia, polyuria at the beginning of January No significant Mental Stressors.  All other systems reviewed and are negative.    Past History of the following :    Past Medical History:  Diagnosis Date   Diabetes mellitus without complication ([place])    Hypertension       History reviewed. No pertinent surgical history.    Social History:      Social History   Tobacco Use   Smoking status: Former Smoker    Packs/day: 1.00    Years: 43.00    Pack years: 43.00    Quit date: 05/22/2017    Years since quitting: 3.0   Smokeless tobacco: Never  Used  Substance Use Topics   Alcohol use: Not Currently    Comment: last drink 2015       Family History :     Family History  Problem Relation Age of Onset   Cancer Mother    Cancer Father    Reviewed   Home Medications:   Prior to Admission medications   Medication Sig Start Date End Date Taking? Authorizing Provider  metFORMIN (GLUCOPHAGE) 500 MG tablet Take 1 tablet (500 mg total) by mouth 2 (two) times daily with a meal. 05/03/20   Pattricia Boss, MD  ondansetron (ZOFRAN ODT) 8 MG disintegrating tablet Take 1 tablet (8 mg total) by mouth every 8 (eight) hours as needed for nausea or vomiting. 05/03/20   Pattricia Boss, MD     Allergies:    No Known Allergies   Physical Exam:   Vitals  Blood pressure (!) 143/88, pulse (!) 111, temperature 98.4 F (36.9 C), temperature source Oral, resp. rate 18, height 6' (1.829 m), weight 81.6 kg, SpO2 100 %.  1.  General: Patient is lying supine in bed with head of bed elevated, no acute distress  2. Psychiatric: Mood and behavior normal, poor insight to healthcare, pleasant and cooperative with exam  3. Neurologic: Face is symmetric, speech and language are normal, moves all 4 extremities voluntarily  4. HEENMT:  Head is atraumatic, normocephalic, pupils reactive to light, neck is supple, trachea is midline, mucous membranes are moist  5. Respiratory : Lungs are diminished in the lower lung fields, no crackles, rhonchi, wheezes, no cyanosis  6. Cardiovascular : Heart rate is tachycardic, rhythm is regular, no murmurs rubs or gallops, no peripheral edema, peripheral pulses palpated  7. Gastrointestinal:  Abdomen is soft, nondistended, nontender to palpation, no palpable masses  8. Skin:  Skin is warm dry and intact without acute lesion on limited skin exam  9.Musculoskeletal:  No acute deformity, peripheral pulses palpated, no peripheral edema    Data Review:    CBC Recent Labs  Lab 05/22/20 1129  WBC 9.6  HGB  11.6*  HCT 35.8*  PLT 355  MCV 88.6  MCH 28.7  MCHC 32.4  RDW 12.7  LYMPHSABS 2.5  MONOABS 0.7  EOSABS 0.1  BASOSABS 0.0   ------------------------------------------------------------------------------------------------------------------  Results for orders placed or performed during the  hospital encounter of 05/22/20 (from the past 48 hour(s))  CBC with Differential     Status: Abnormal   Collection Time: 05/22/20 11:29 AM  Result Value Ref Range   WBC 9.6 4.0 - 10.5 K/uL   RBC 4.04 (L) 4.22 - 5.81 MIL/uL   Hemoglobin 11.6 (L) 13.0 - 17.0 g/dL   HCT 44.9 (L) 67.5 - 91.6 %   MCV 88.6 80.0 - 100.0 fL   MCH 28.7 26.0 - 34.0 pg   MCHC 32.4 30.0 - 36.0 g/dL   RDW 38.4 66.5 - 99.3 %   Platelets 355 150 - 400 K/uL   nRBC 0.0 0.0 - 0.2 %   Neutrophils Relative % 66 %   Neutro Abs 6.3 1.7 - 7.7 K/uL   Lymphocytes Relative 26 %   Lymphs Abs 2.5 0.7 - 4.0 K/uL   Monocytes Relative 7 %   Monocytes Absolute 0.7 0.1 - 1.0 K/uL   Eosinophils Relative 1 %   Eosinophils Absolute 0.1 0.0 - 0.5 K/uL   Basophils Relative 0 %   Basophils Absolute 0.0 0.0 - 0.1 K/uL   Immature Granulocytes 0 %   Abs Immature Granulocytes 0.03 0.00 - 0.07 K/uL    Comment: Performed at [facility], [address] [place], [name] [address]  Brain natriuretic peptide     Status: Abnormal   Collection Time: 05/22/20 11:29 AM  Result Value Ref Range   B Natriuretic Peptide 636.6 (H) 0.0 - 100.0 pg/mL    Comment: Performed at [facility], [address] [place], [name] [address]  SARS Coronavirus 2 by RT PCR (hospital order, performed in [facility] hospital lab) Nasopharyngeal Nasopharyngeal Swab     Status: None   Collection Time: 05/22/20 11:29 AM   Specimen: Nasopharyngeal Swab  Result Value Ref Range   SARS Coronavirus 2 NEGATIVE NEGATIVE    Comment: (NOTE) SARS-CoV-2 target nucleic acids are NOT DETECTED.  The SARS-CoV-2 RNA is generally detectable in upper and  lower respiratory specimens during the acute phase of infection. The lowest concentration of SARS-CoV-2 viral copies this assay can detect is 250 copies / mL. A negative result does not preclude SARS-CoV-2 infection and should not be used as the sole basis for treatment or other patient management decisions.  A negative result may occur with improper specimen collection / handling, submission of specimen other than nasopharyngeal swab, presence of viral mutation(s) within the areas targeted by this assay, and inadequate number of viral copies (<250 copies / mL). A negative result must be combined with clinical observations, patient history, and epidemiological information.  Fact Sheet for Patients:   BoilerBrush.com.cy  Fact Sheet for Healthcare Providers: https://pope.com/  This test is not yet approved or  cleared by the [place] FDA and has been authorized for detection and/or diagnosis of SARS-CoV-2 by FDA under an Emergency Use Authorization (EUA).  This EUA will remain in effect (meaning this test can be used) for the duration of the COVID-19 declaration under Section 564(b)(1) of the Act, 21 U.S.C. section 360bbb-3(b)(1), unless the authorization is terminated or revoked sooner.  Performed at [facility], [address] [place], [name] [address]   Comprehensive metabolic panel     Status: Abnormal   Collection Time: 05/22/20 12:08 PM  Result Value Ref Range   Sodium 134 (L) 135 - 145 mmol/L   Potassium 4.3 3.5 - 5.1 mmol/L   Chloride 99 98 - 111 mmol/L  CO2 25 22 - 32 mmol/L   Glucose, Bld 169 (H) 70 - 99 mg/dL    Comment: Glucose reference range applies only to samples taken after fasting for at least 8 hours.   BUN 13 6 - 20 mg/dL   Creatinine, Ser 0.84 0.61 - 1.24 mg/dL   Calcium 9.1 8.9 - 10.3 mg/dL   Total Protein 7.2 6.5 - 8.1 g/dL   Albumin 3.1 (L) 3.5 - 5.0 g/dL   AST 14 (L) 15 - 41 U/L   ALT  17 0 - 44 U/L   Alkaline Phosphatase 34 (L) 38 - 126 U/L   Total Bilirubin 0.1 (L) 0.3 - 1.2 mg/dL   GFR, Estimated >60 >60 mL/min    Comment: (NOTE) Calculated using the CKD-EPI Creatinine Equation (2021)    Anion gap 10 5 - 15    Comment: Performed at [facility], [address] [place], [place] [address]  D-dimer, quantitative (not at [facility])     Status: Abnormal   Collection Time: 05/22/20 12:08 PM  Result Value Ref Range   D-Dimer, Quant 0.86 (H) 0.00 - 0.50 ug/mL-FEU    Comment: (NOTE) At the manufacturer cut-off value of 0.5 g/mL FEU, this assay has a negative predictive value of 95-100%.This assay is intended for use in conjunction with a clinical pretest probability (PTP) assessment model to exclude pulmonary embolism (PE) and deep venous thrombosis (DVT) in outpatients suspected of PE or DVT. Results should be correlated with clinical presentation. Performed at [facility], [address] [place], [place] [address]   Troponin I (High Sensitivity)     Status: None   Collection Time: 05/22/20 12:08 PM  Result Value Ref Range   Troponin I (High Sensitivity) 12 <18 ng/L    Comment: (NOTE) Elevated high sensitivity troponin I (hsTnI) values and significant  changes across serial measurements may suggest ACS but many other  chronic and acute conditions are known to elevate hsTnI results.  Refer to the "Links" section for chest pain algorithms and additional  guidance. Performed at [facility], [address] [place], [place] [address]   Troponin I (High Sensitivity)     Status: None   Collection Time: 05/22/20  2:26 PM  Result Value Ref Range   Troponin I (High Sensitivity) 12 <18 ng/L    Comment: (NOTE) Elevated high sensitivity troponin I (hsTnI) values and significant  changes across serial measurements may suggest ACS but many other  chronic and acute conditions are known to elevate hsTnI results.  Refer to the "Links" section  for chest pain algorithms and additional  guidance. Performed at [facility], [address] [place], [place] [address]   Glucose, capillary     Status: Abnormal   Collection Time: 05/22/20  6:55 PM  Result Value Ref Range   Glucose-Capillary 152 (H) 70 - 99 mg/dL    Comment: Glucose reference range applies only to samples taken after fasting for at least 8 hours.    Chemistries  Recent Labs  Lab 05/22/20 1208  NA 134*  K 4.3  CL 99  CO2 25  GLUCOSE 169*  BUN 13  CREATININE 0.84  CALCIUM 9.1  AST 14*  ALT 17  ALKPHOS 34*  BILITOT 0.1*   ------------------------------------------------------------------------------------------------------------------  ------------------------------------------------------------------------------------------------------------------ GFR: Estimated Creatinine Clearance: 102.6 mL/min (by C-G formula based on SCr of 0.84 mg/dL). Liver Function Tests: Recent Labs  Lab 05/22/20 1208  AST 14*  ALT  17  ALKPHOS 34*  BILITOT 0.1*  PROT 7.2  ALBUMIN 3.1*   No results for input(s): LIPASE, AMYLASE in the last 168 hours. No results for input(s): AMMONIA in the last 168 hours. Coagulation Profile: No results for input(s): INR, PROTIME in the last 168 hours. Cardiac Enzymes: No results for input(s): CKTOTAL, CKMB, CKMBINDEX, TROPONINI in the last 168 hours. BNP (last 3 results) No results for input(s): PROBNP in the last 8760 hours. HbA1C: No results for input(s): HGBA1C in the last 72 hours. CBG: Recent Labs  Lab 05/22/20 1855  GLUCAP 152*   Lipid Profile: No results for input(s): CHOL, HDL, LDLCALC, TRIG, CHOLHDL, LDLDIRECT in the last 72 hours. Thyroid Function Tests: No results for input(s): TSH, T4TOTAL, FREET4, T3FREE, THYROIDAB in the last 72 hours. Anemia Panel: No results for input(s): VITAMINB12, FOLATE, FERRITIN, TIBC, IRON, RETICCTPCT in the last 72  hours.  --------------------------------------------------------------------------------------------------------------- Urine analysis:    Component Value Date/Time   COLORURINE YELLOW 05/03/2020 1303   APPEARANCEUR CLEAR 05/03/2020 1303   LABSPEC 1.022 05/03/2020 1303   PHURINE 6.0 05/03/2020 1303   GLUCOSEU >=500 (A) 05/03/2020 1303   HGBUR NEGATIVE 05/03/2020 1303   BILIRUBINUR NEGATIVE 05/03/2020 1303   KETONESUR 80 (A) 05/03/2020 1303   PROTEINUR NEGATIVE 05/03/2020 1303   NITRITE NEGATIVE 05/03/2020 1303   LEUKOCYTESUR NEGATIVE 05/03/2020 1303      Imaging Results:    CT Angio Chest PE W/Cm &/Or Wo Cm  Result Date: 05/22/2020 CLINICAL DATA:  NEG COVID test today  D Dimer 0.86  Per chart: Pt reports shortness of breath x1 week. Pt states around christmas he mixed chemicals while cleaning. Chemical exposure EXAM: CT ANGIOGRAPHY CHEST WITH CONTRAST TECHNIQUE: Multidetector CT imaging of the chest was performed using the standard protocol during bolus administration of intravenous contrast. Multiplanar CT image reconstructions and MIPs were obtained to evaluate the vascular anatomy. CONTRAST:  148mL OMNIPAQUE IOHEXOL 350 MG/ML SOLN COMPARISON:  None. FINDINGS: Cardiovascular: No filling defects within the pulmonary arteries to suggest acute pulmonary embolism. No pericardial effusion. Coronary artery calcification and aortic atherosclerotic calcification. Mediastinum/Nodes: There are calcified mediastinal lymph nodes. No lymphadenopathy Borderline enlarged paratracheal lymph nodes. Lungs/Pleura: Diffuse perihilar airspace disease with confluent ground-glass densities. Large bilateral layering pleural effusions. Upper Abdomen: Limited view of the liver, kidneys, pancreas are unremarkable. Normal adrenal glands. Musculoskeletal: No aggressive osseous lesion. Review of the MIP images confirms the above findings. IMPRESSION: 1. No evidence acute pulmonary embolism. 2. Diffuse severe perihilar  airspace disease. Bilateral large pleural effusion. Typically COVID pneumonia does not have associated pleural effusions. Additionally COVID pneumonia is typically peripherally distributed. This central airspace disease is more favored flash pulmonary edema, inhalation toxicity (given history), or atypical pneumonia. 3. Calcified mediastinal lymph nodes consistent with prior granulomatous disease. 4. Aortic Atherosclerosis (ICD10-I70.0). Electronically Signed   By: Suzy Bouchard M.D.   On: 05/22/2020 14:50       Assessment & Plan:    Active Problems:   Pulmonary edema cardiac cause ([place])   1. Acute respiratory failure with hypoxia 1. Most likely 2/2 to new onset CHF 2. CT angio chest shows no evidence of acute pulmonary embolism.  Diffuse severe perihilar airspace disease, bilateral large pleural effusions favoring pulmonary edema 3. Troponins flat at 12, 12 4. Initial EKG shows sinus tachycardia with a rate of 116, abnormal EKG, repeat pending 5. Patient had oxygen saturations down to 87%, normalized on 2 L nasal cannula, but patient was placed on BiPAP for comfort.  Upon arrival  to hospital, patient has been weaned off of BiPAP and on 5 L nasal cannula, continue to wean off oxygen as tolerated 6. COVID-negative 7. Monitor on telemetry 2. New onset CHF 1. Cardiology consulted, and notified of patient's arrival-appreciate recommendations 2. Troponins flat at 12 and 12 3. EKG as above, repeat pending 4. CT angio shows pulmonary edema favoring CHF, and bilateral pleural effusions 5. BNP is elevated at 636 6. 40 mg Lasix IV given in the ED, will continue 40 mg Lasix IV twice daily until there is clinical improvement 7. Monitor strict I's and O's 8. Low-sodium diet with fluid restriction 9. Daily weights 10. Echo pending 11. TSH pending 12. Continue to monitor 3. Mild protein cal malnutrition 1. Encourage nutrient dense food choices 2. Holding off on protein shakes at this time as it  would add more fluid in a patient who is on fluid restrictions 3. Continue to monitor 4. DMII 1. Hold metformin 2. Sliding scale coverage 3. Hemoglobin A1c in the a.m. 4. Continue to monitor    DVT Prophylaxis-   Heparin- SCDs   AM Labs Ordered, also please review Full Orders  Family Communication: No family at bedside  Code Status: Full  Admission status: Inpatient :The appropriate admission status for this patient is INPATIENT. Inpatient status is judged to be reasonable and necessary in order to provide the required intensity of service to ensure the patient's safety. The patient's presenting symptoms, physical exam findings, and initial radiographic and laboratory data in the context of their chronic comorbidities is felt to place them at high risk for further clinical deterioration. Furthermore, it is not anticipated that the patient will be medically stable for discharge from the hospital within 2 midnights of admission. The following factors support the admission status of inpatient.     The patient's presenting symptoms include Dyspnea The worrisome physical exam findings include Hypoxia The initial radiographic and laboratory data are worrisome because of pulm edema, elevated BNP The chronic co-morbidities include DMII.       * I certify that at the point of admission it is my clinical judgment that the patient will require inpatient hospital care spanning beyond 2 midnights from the point of admission due to high intensity of service, high risk for further deterioration and high frequency of surveillance required.*  Time spent in minutes : [place]

## 2020-05-22 NOTE — Progress Notes (Signed)
Pt arrived to rm 21 from [facility] via [facility]. Pt was on BiPAP. RT was able to wean pt off from BiPAP. Pt is on 5L O2 currently with o2 sat>90%. Pt refused SOB. CHG wipe and focus assessment done. BG Checked. Elevated HR (120s) will initiated yellow MEWs protocol. Page MD. Awaiting for MD to assess and put the order in. Pt appear pleasant. Call bell within reach. Passed on the night shift nurse to continue to monitor the pt.   Lavenia Atlas, RN

## 2020-05-22 NOTE — ED Notes (Signed)
Pt presents with sob x 1 week. Pt states he had cxr yesterday; results stated covid pneumonia. Pt breathing evenly, no distress noted at this time; O2 sat 93% RA. Pt reports syncopal episode 3 days ago - states he walked around his truck and "woke up on the ground." Pt denies dizziness, lightheadedness today, states he sometimes feels lightheaded if he "moves too fast" or "talks too much." Pt alert & oriented, NAD noted.

## 2020-05-23 ENCOUNTER — Inpatient Hospital Stay (HOSPITAL_COMMUNITY): Payer: BC Managed Care – PPO

## 2020-05-23 DIAGNOSIS — I472 Ventricular tachycardia: Secondary | ICD-10-CM

## 2020-05-23 DIAGNOSIS — I10 Essential (primary) hypertension: Secondary | ICD-10-CM

## 2020-05-23 DIAGNOSIS — I5020 Unspecified systolic (congestive) heart failure: Secondary | ICD-10-CM

## 2020-05-23 DIAGNOSIS — I471 Supraventricular tachycardia: Secondary | ICD-10-CM

## 2020-05-23 DIAGNOSIS — I5021 Acute systolic (congestive) heart failure: Secondary | ICD-10-CM

## 2020-05-23 DIAGNOSIS — E119 Type 2 diabetes mellitus without complications: Secondary | ICD-10-CM

## 2020-05-23 LAB — GLUCOSE, CAPILLARY
Glucose-Capillary: 132 mg/dL — ABNORMAL HIGH (ref 70–99)
Glucose-Capillary: 204 mg/dL — ABNORMAL HIGH (ref 70–99)
Glucose-Capillary: 160 mg/dL — ABNORMAL HIGH (ref 70–99)
Glucose-Capillary: 205 mg/dL — ABNORMAL HIGH (ref 70–99)

## 2020-05-23 LAB — COMPREHENSIVE METABOLIC PANEL
ALT: 16 U/L (ref 0–44)
ALT: 14 U/L (ref 0–44)
AST: 13 U/L — ABNORMAL LOW (ref 15–41)
AST: 12 U/L — ABNORMAL LOW (ref 15–41)
Albumin: 2.5 g/dL — ABNORMAL LOW (ref 3.5–5.0)
Albumin: 2.4 g/dL — ABNORMAL LOW (ref 3.5–5.0)
Alkaline Phosphatase: 28 U/L — ABNORMAL LOW (ref 38–126)
Alkaline Phosphatase: 30 U/L — ABNORMAL LOW (ref 38–126)
Anion gap: 10 (ref 5–15)
Anion gap: 10 (ref 5–15)
BUN: 10 mg/dL (ref 6–20)
BUN: 11 mg/dL (ref 6–20)
CO2: 25 mmol/L (ref 22–32)
CO2: 24 mmol/L (ref 22–32)
Calcium: 8.5 mg/dL — ABNORMAL LOW (ref 8.9–10.3)
Calcium: 8.4 mg/dL — ABNORMAL LOW (ref 8.9–10.3)
Chloride: 100 mmol/L (ref 98–111)
Chloride: 97 mmol/L — ABNORMAL LOW (ref 98–111)
Creatinine, Ser: 0.83 mg/dL (ref 0.61–1.24)
Creatinine, Ser: 0.89 mg/dL (ref 0.61–1.24)
GFR, Estimated: >60 mL/min (ref >60)
GFR, Estimated: >60 mL/min (ref >60)
Glucose, Bld: 156 mg/dL — ABNORMAL HIGH (ref 70–99)
Glucose, Bld: 195 mg/dL — ABNORMAL HIGH (ref 70–99)
Potassium: 4 mmol/L (ref 3.5–5.1)
Potassium: 4.1 mmol/L (ref 3.5–5.1)
Sodium: 135 mmol/L (ref 135–145)
Sodium: 131 mmol/L — ABNORMAL LOW (ref 135–145)
Total Bilirubin: 0.7 mg/dL (ref 0.3–1.2)
Total Bilirubin: 0.4 mg/dL (ref 0.3–1.2)
Total Protein: 6.1 g/dL — ABNORMAL LOW (ref 6.5–8.1)
Total Protein: 6 g/dL — ABNORMAL LOW (ref 6.5–8.1)

## 2020-05-23 LAB — CBC
HCT: 34.3 % — ABNORMAL LOW (ref 39.0–52.0)
HCT: 32.7 % — ABNORMAL LOW (ref 39.0–52.0)
Hemoglobin: 11.1 g/dL — ABNORMAL LOW (ref 13.0–17.0)
Hemoglobin: 10.9 g/dL — ABNORMAL LOW (ref 13.0–17.0)
MCH: 28.8 pg (ref 26.0–34.0)
MCH: 29.4 pg (ref 26.0–34.0)
MCHC: 32.4 g/dL (ref 30.0–36.0)
MCHC: 33.3 g/dL (ref 30.0–36.0)
MCV: 88.9 fL (ref 80.0–100.0)
MCV: 88.1 fL (ref 80.0–100.0)
Platelets: 291 10*3/uL (ref 150–400)
Platelets: 342 10*3/uL (ref 150–400)
RBC: 3.86 MIL/uL — ABNORMAL LOW (ref 4.22–5.81)
RBC: 3.71 MIL/uL — ABNORMAL LOW (ref 4.22–5.81)
RDW: 12.6 % (ref 11.5–15.5)
RDW: 12.7 % (ref 11.5–15.5)
WBC: 10.1 10*3/uL (ref 4.0–10.5)
WBC: 9.3 10*3/uL (ref 4.0–10.5)
nRBC: 0 % (ref 0.0–0.2)
nRBC: 0 % (ref 0.0–0.2)

## 2020-05-23 LAB — ECHOCARDIOGRAM COMPLETE
Area-P 1/2: 3.85 cm2
Calc EF: 34 %
Height: 72 in
S' Lateral: 4.4 cm
Single Plane A2C EF: 38.8 %
Single Plane A4C EF: 32.6 %
Weight: 2680 oz

## 2020-05-23 LAB — HEMOGLOBIN A1C
Hgb A1c MFr Bld: 10.1 % — ABNORMAL HIGH (ref 4.8–5.6)
Mean Plasma Glucose: 243.17 mg/dL

## 2020-05-23 LAB — MAGNESIUM: Magnesium: 1.7 mg/dL (ref 1.7–2.4)

## 2020-05-23 LAB — T4, FREE: Free T4: 1.19 ng/dL — ABNORMAL HIGH (ref 0.61–1.12)

## 2020-05-23 LAB — TSH: TSH: 0.632 u[IU]/mL (ref 0.350–4.500)

## 2020-05-23 MED ORDER — METOPROLOL TARTRATE 25 MG PO TABS
25.0000 mg | ORAL_TABLET | Freq: Two times a day (BID) | ORAL | Status: DC
  Administered 2020-05-23 – 2020-05-25 (×6): 25 mg via ORAL
  Filled 2020-05-23 (×6): qty 1

## 2020-05-23 MED ORDER — INFLUENZA VAC SPLIT QUAD 0.5 ML IM SUSY
0.5000 mL | PREFILLED_SYRINGE | INTRAMUSCULAR | Status: AC
  Administered 2020-05-24: 0.5 mL via INTRAMUSCULAR
  Filled 2020-05-23: qty 0.5

## 2020-05-23 MED ORDER — METOPROLOL TARTRATE 5 MG/5ML IV SOLN
INTRAVENOUS | Status: AC
  Filled 2020-05-23: qty 5

## 2020-05-23 MED ORDER — MAGNESIUM SULFATE 2 GM/50ML IV SOLN
2.0000 g | Freq: Once | INTRAVENOUS | Status: AC
  Administered 2020-05-23: 2 g via INTRAVENOUS
  Filled 2020-05-23: qty 50

## 2020-05-23 MED ORDER — FUROSEMIDE 10 MG/ML IJ SOLN
10.0000 mg/h | INTRAVENOUS | Status: DC
  Filled 2020-05-23: qty 20

## 2020-05-23 MED ORDER — FUROSEMIDE 10 MG/ML IJ SOLN
80.0000 mg | Freq: Two times a day (BID) | INTRAMUSCULAR | Status: DC
  Administered 2020-05-23: 80 mg via INTRAVENOUS
  Filled 2020-05-23: qty 8

## 2020-05-23 MED ORDER — DAPAGLIFLOZIN PROPANEDIOL 10 MG PO TABS
10.0000 mg | ORAL_TABLET | Freq: Every day | ORAL | Status: DC
  Administered 2020-05-23 – 2020-05-26 (×4): 10 mg via ORAL
  Filled 2020-05-23 (×4): qty 1

## 2020-05-23 MED ORDER — METOPROLOL TARTRATE 5 MG/5ML IV SOLN
5.0000 mg | Freq: Once | INTRAVENOUS | Status: AC
  Administered 2020-05-23: 5 mg via INTRAVENOUS

## 2020-05-23 NOTE — Progress Notes (Signed)
Mobility Specialist - Progress Note   05/23/20 1421  Mobility  Activity Contraindicated/medical hold   Instructed by RN to hold mobility today as his HR was sustained in the 160s earlier.   Pricilla Handler Mobility Specialist Mobility Specialist Phone: [phone]

## 2020-05-23 NOTE — Progress Notes (Signed)
PROGRESS NOTE    Derek Thompson  [id number] DOB: 1960/07/16 DOA: 05/22/2020 PCP: Patient, No Pcp Per  Status is: Inpatient  Remains inpatient appropriate because:Ongoing evaluation for newly diagnosed heart failure with reduced ejection fraction.   Dispo:  Patient From: Home  Planned Disposition: To be determined  Expected discharge date: 05/27/2020  Medically stable for discharge: No       Brief Narrative:  60 year old gentleman with history significant for type 2 diabetes who presented to progressive dyspnea for 1 week.  Symptoms were most consistent with heart failure including orthopnea.  In addition to dyspnea the patient had had a syncopal episode for the last week with his symptoms.  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  In the ER the patient was noted to be hypoxic and placed on 2 L nasal cannula then BiPAP for comfort.  CT angio was obtained which did not show any pulmonary embolism but did show pulmonary edema and effusions.  Cardiology was consulted.  1/22-echocardiogram returned demonstrating a marked reduction in ejection fraction of 25% With akinesis and hypokinesis throughout, supraventricular tachycardia on monitor.  Assessment & Plan:   Active Problems:   Pulmonary edema cardiac cause (HCC)   Acute hypoxic respiratory failure due to exacerbation of newly diagnosed acute heart failure with reduced ejection fraction, most suspicious for ischemic disease given his underlying diabetes and risk factors.  Also could consider alcohol induced cardiomyopathy with this to be less likely given he reports he stopped alcohol use.  [place] cardiology care and consultation. -IV Lasix 80 mg twice daily, strict I's and O's -Continue telemetry -Appreciate cardiology consultation and care -Start low-dose beta-blocker as below given his SVT. -SGLT2 inhibitor for both heart failure and diabetes we will monitor kidney function  closely -Replete electrolytes magnesium greater than 2 potassium greater than 4.  Wide-complex tachycardia has baseline left bundle, appreciate cardiology care and consultation continue diuresis and start low-dose beta-blocker.  Syncope this is likely due to low output in setting of new heart failure and possibly an episode of arrhythmia.  May benefit from loop recorder or other monitor on discharge.  Mental status is appropriate and neurologic exam normal if this were to change would recommend head imaging is not on admission.  Type 2 diabetes sliding scale insulin and SGLT2 inhibitor.  Monitor CBGs. Given DM and aortic atherosclerosis on imaging would benefit from statin   Hyponatremia likely due to hypovolemia, continue to monitor  Normocytic anemia suspect due to chronic disease add on ferritin.  DVT prophylaxis: Heparin  Code Status: FULL  Family Communication: Updated patient at bedside Disposition Plan: Pending Cardiology evaluation--likely cath this admission     Consultants:   Cardiolgoy   Procedures:   Echocardiogram  Antimicrobials:   none    Subjective: Patient has no complaints.  He denies chest pain, dyspnea, headache.  He reports he has been up and walking and feeling well.  Denies any presyncopal episodes.  Objective: Vitals:   05/23/20 0115 05/23/20 0200 05/23/20 0300 05/23/20 0400  BP: 123/83 127/83 (!) 131/92   Pulse: (!) 103 (!) 104 (!) 104   Resp: 20 (!) 25 15   Temp: 98.8 F (37.1 C) 98.9 F (37.2 C) 98.9 F (37.2 C)   TempSrc: Oral Oral Oral   SpO2: 97% 97% 98%   Weight:    76 kg  Height:        Intake/Output Summary (Last 24 hours) at 05/23/2020 [id number] Last data filed at  05/22/2020 1900 Gross per 24 hour  Intake -  Output 1200 ml  Net -1200 ml   Filed Weights   05/22/20 1006 05/23/20 0400  Weight: 81.6 kg 76 kg    Examination: Alert and appropriate oriented to person place and date talkative in room. Dentition is  moderate Tachycardic rate with excellent pulses unable to appreciate murmurs given rate at this time.  Lungs are clear 1+ pedal edema. No rashes or ulcers noted.     Data Reviewed: I have personally reviewed following labs and imaging studies  CBC: Recent Labs  Lab 05/22/20 1129 05/23/20 0155  WBC 9.6 10.1  NEUTROABS 6.3  --   HGB 11.6* 11.1*  HCT 35.8* 34.3*  MCV 88.6 88.9  PLT 355 371   Basic Metabolic Panel: Recent Labs  Lab 05/22/20 1208 05/23/20 0155  NA 134* 135  K 4.3 4.0  CL 99 100  CO2 25 25  GLUCOSE 169* 156*  BUN 13 10  CREATININE 0.84 0.83  CALCIUM 9.1 8.5*  MG  --  1.7   GFR: Estimated Creatinine Clearance: 101.7 mL/min (by C-G formula based on SCr of 0.83 mg/dL). Liver Function Tests: Recent Labs  Lab 05/22/20 1208 05/23/20 0155  AST 14* 13*  ALT 17 16  ALKPHOS 34* 28*  BILITOT 0.1* 0.7  PROT 7.2 6.1*  ALBUMIN 3.1* 2.5*   No results for input(s): LIPASE, AMYLASE in the last 168 hours. No results for input(s): AMMONIA in the last 168 hours. Coagulation Profile: No results for input(s): INR, PROTIME in the last 168 hours. Cardiac Enzymes: No results for input(s): CKTOTAL, CKMB, CKMBINDEX, TROPONINI in the last 168 hours. BNP (last 3 results) No results for input(s): PROBNP in the last 8760 hours. HbA1C: Recent Labs    05/23/20 0155  HGBA1C 10.1*   CBG: Recent Labs  Lab 05/22/20 1855 05/22/20 2113 05/23/20 0619  GLUCAP 152* 139* 132*   Lipid Profile: No results for input(s): CHOL, HDL, LDLCALC, TRIG, CHOLHDL, LDLDIRECT in the last 72 hours. Thyroid Function Tests: Recent Labs    05/23/20 0155  TSH 0.632  FREET4 1.19*   Anemia Panel: No results for input(s): VITAMINB12, FOLATE, FERRITIN, TIBC, IRON, RETICCTPCT in the last 72 hours. Sepsis Labs: No results for input(s): PROCALCITON, LATICACIDVEN in the last 168 hours.  Recent Results (from the past 240 hour(s))  SARS Coronavirus 2 by RT PCR (hospital order, performed in  [facility] hospital lab) Nasopharyngeal Nasopharyngeal Swab     Status: None   Collection Time: 05/22/20 11:29 AM   Specimen: Nasopharyngeal Swab  Result Value Ref Range Status   SARS Coronavirus 2 NEGATIVE NEGATIVE Final    Comment: (NOTE) SARS-CoV-2 target nucleic acids are NOT DETECTED.  The SARS-CoV-2 RNA is generally detectable in upper and lower respiratory specimens during the acute phase of infection. The lowest concentration of SARS-CoV-2 viral copies this assay can detect is 250 copies / mL. A negative result does not preclude SARS-CoV-2 infection and should not be used as the sole basis for treatment or other patient management decisions.  A negative result may occur with improper specimen collection / handling, submission of specimen other than nasopharyngeal swab, presence of viral mutation(s) within the areas targeted by this assay, and inadequate number of viral copies (<250 copies / mL). A negative result must be combined with clinical observations, patient history, and epidemiological information.  Fact Sheet for Patients:   StrictlyIdeas.no  Fact Sheet for Healthcare Providers: BankingDealers.co.za  This test is not yet approved  or  cleared by the [place] and has been authorized for detection and/or diagnosis of SARS-CoV-2 by FDA under an Emergency Use Authorization (EUA).  This EUA will remain in effect (meaning this test can be used) for the duration of the COVID-19 declaration under Section 564(b)(1) of the Act, 21 U.S.C. section 360bbb-3(b)(1), unless the authorization is terminated or revoked sooner.  Performed at [facility], [address] [place], [place] [address]          Radiology Studies: DG Chest 2 View  Result Date: 05/23/2020 CLINICAL DATA:  Acute diastolic CHF.  Shortness of breath EXAM: CHEST - 2 VIEW COMPARISON:  Chest CT from yesterday FINDINGS: Perihilar  infiltrates and layering pleural effusions better seen on prior chest CT. Similar degree of generalized interstitial coarsening. Calcified subcarinal lymph node. No visible pneumothorax. Stable heart size. IMPRESSION: Unchanged airspace disease. Pleural effusions which were layering and largely occult by radiography. Electronically Signed   By: Monte Fantasia M.D.   On: 05/23/2020 05:56   CT Angio Chest PE W/Cm &/Or Wo Cm  Result Date: 05/22/2020 CLINICAL DATA:  NEG COVID test today  D Dimer 0.86  Per chart: Pt reports shortness of breath x1 week. Pt states around christmas he mixed chemicals while cleaning. Chemical exposure EXAM: CT ANGIOGRAPHY CHEST WITH CONTRAST TECHNIQUE: Multidetector CT imaging of the chest was performed using the standard protocol during bolus administration of intravenous contrast. Multiplanar CT image reconstructions and MIPs were obtained to evaluate the vascular anatomy. CONTRAST:  139mL OMNIPAQUE IOHEXOL 350 MG/ML SOLN COMPARISON:  None. FINDINGS: Cardiovascular: No filling defects within the pulmonary arteries to suggest acute pulmonary embolism. No pericardial effusion. Coronary artery calcification and aortic atherosclerotic calcification. Mediastinum/Nodes: There are calcified mediastinal lymph nodes. No lymphadenopathy Borderline enlarged paratracheal lymph nodes. Lungs/Pleura: Diffuse perihilar airspace disease with confluent ground-glass densities. Large bilateral layering pleural effusions. Upper Abdomen: Limited view of the liver, kidneys, pancreas are unremarkable. Normal adrenal glands. Musculoskeletal: No aggressive osseous lesion. Review of the MIP images confirms the above findings. IMPRESSION: 1. No evidence acute pulmonary embolism. 2. Diffuse severe perihilar airspace disease. Bilateral large pleural effusion. Typically COVID pneumonia does not have associated pleural effusions. Additionally COVID pneumonia is typically peripherally distributed. This central  airspace disease is more favored flash pulmonary edema, inhalation toxicity (given history), or atypical pneumonia. 3. Calcified mediastinal lymph nodes consistent with prior granulomatous disease. 4. Aortic Atherosclerosis (ICD10-I70.0). Electronically Signed   By: Suzy Bouchard M.D.   On: 05/22/2020 14:50        Scheduled Meds: . furosemide  40 mg Intravenous BID  . heparin  5,000 Units Subcutaneous Q8H  . [START ON 05/24/2020] influenza vac split quadrivalent PF  0.5 mL Intramuscular Tomorrow-1000  . insulin aspart  0-15 Units Subcutaneous TID WC  . insulin aspart  0-5 Units Subcutaneous QHS  . nitroGLYCERIN  1 inch Topical Q6H   Continuous Infusions: . magnesium sulfate bolus IVPB       LOS: 1 day    Time spent: >35 minutes     Dorris Singh, MD   [facility] Pager [phone]  If 7PM-7AM, please contact night-coverage [email or web address] Password [facility] 05/23/2020, 7:34 AM

## 2020-05-23 NOTE — Progress Notes (Signed)
  Echocardiogram 2D Echocardiogram has been performed.  Jennette Dubin 05/23/2020, 10:06 AM

## 2020-05-23 NOTE — Plan of Care (Signed)

## 2020-05-23 NOTE — Progress Notes (Signed)
TR[facility] Brief Note   Paged by nurse for HR of 160s that has been persistent after echocardiogram.  Patient seen immediately after [place].  Patient denies any symptoms.  Has been walking to the bathroom.  Denies dizziness, chest pain, dyspnea.  He reports he feels overall fine. EKG obtained does show wide-complex regular tachycardia, suspicious for SVT or sinus tach. Will obtain CBC and BMP now.  Discussed with cardiology who agrees with metoprolol once.  We will continue to monitor for recurrence.  On reevaluation this p.m. heart rate improved to low 100s.  [place] cardiology care and consultation full note to follow.

## 2020-05-23 NOTE — Progress Notes (Signed)
Progress Note  Patient Name: Derek Thompson Date of Encounter: 05/23/2020  Primary Cardiologist: No primary care provider on file.   Subjective   60 yo M with DM with HTN who had NYE time Nausea and Diaphoresis.  Saw Ed after that time (05/03/20) with relatively benign work up.  Has noted progress SOB, PND, Orthopnea. Since.    Overnight and sudden onset transition to new SVT.  With this had no symptoms. On examining patient and prior to intervention spontaneously converted to SR.  Echo performed while in SR was notable for new WMA and EF ~ 25%.  Patient notes that he feels better.  No palpitations during SVT.  No CP.  Thinks that these chemicals may have started everything.  Inpatient Medications    Scheduled Meds: . furosemide  80 mg Intravenous BID  . heparin  5,000 Units Subcutaneous Q8H  . [START ON 05/24/2020] influenza vac split quadrivalent PF  0.5 mL Intramuscular Tomorrow-1000  . insulin aspart  0-15 Units Subcutaneous TID WC  . insulin aspart  0-5 Units Subcutaneous QHS  . metoprolol tartrate      . metoprolol tartrate  25 mg Oral BID  . nitroGLYCERIN  1 inch Topical Q6H   Continuous Infusions: . magnesium sulfate bolus IVPB 2 g (05/23/20 1033)   PRN Meds: acetaminophen **OR** acetaminophen, morphine injection, ondansetron **OR** ondansetron (ZOFRAN) IV, oxyCODONE, polyethylene glycol   Vital Signs    Vitals:   05/23/20 0300 05/23/20 0400 05/23/20 0900 05/23/20 1013  BP: (!) 131/92  131/85 132/90  Pulse: (!) 104  (!) 103 (!) 160  Resp: 15  (!) 21 (!) 26  Temp: 98.9 F (37.2 C)  98.1 F (36.7 C) 99.1 F (37.3 C)  TempSrc: Oral   Oral  SpO2: 98%  98% 95%  Weight:  76 kg    Height:        Intake/Output Summary (Last 24 hours) at 05/23/2020 1128 Last data filed at 05/22/2020 1900 Gross per 24 hour  Intake -  Output 1200 ml  Net -1200 ml   Filed Weights   05/22/20 1006 05/23/20 0400  Weight: 81.6 kg 76 kg    Telemetry    SR-> SVT-> SR presetnyl -  Personally Reviewed  ECG    WCT with LBBB Morphology rate 157 - Personally Reviewed  Physical Exam   GEN: No acute distress.   Neck:  JVD to mid neck Cardiac: RRR, no murmurs, rubs, or gallops.  Respiratory: Coarse breath sounds in all lung fields, good air movement GI: Soft, nontender, non-distended  MS: No edema; No deformity. Neuro:  Nonfocal  Psych: Normal affect   Labs    Chemistry Recent Labs  Lab 05/22/20 1208 05/23/20 0155  NA 134* 135  K 4.3 4.0  CL 99 100  CO2 25 25  GLUCOSE 169* 156*  BUN 13 10  CREATININE 0.84 0.83  CALCIUM 9.1 8.5*  PROT 7.2 6.1*  ALBUMIN 3.1* 2.5*  AST 14* 13*  ALT 17 16  ALKPHOS 34* 28*  BILITOT 0.1* 0.7  GFRNONAA >60 >60  ANIONGAP 10 10     Hematology Recent Labs  Lab 05/22/20 1129 05/23/20 0155  WBC 9.6 10.1  RBC 4.04* 3.86*  HGB 11.6* 11.1*  HCT 35.8* 34.3*  MCV 88.6 88.9  MCH 28.7 28.8  MCHC 32.4 32.4  RDW 12.7 12.6  PLT 355 291    Cardiac EnzymesNo results for input(s): TROPONINI in the last 168 hours. No results for input(s): TROPIPOC in the  last 168 hours.   BNP Recent Labs  Lab 05/22/20 1129  BNP 636.6*     DDimer  Recent Labs  Lab 05/22/20 1208  DDIMER 0.86*     Radiology    DG Chest 2 View  Result Date: 05/23/2020 CLINICAL DATA:  Acute diastolic CHF.  Shortness of breath EXAM: CHEST - 2 VIEW COMPARISON:  Chest CT from yesterday FINDINGS: Perihilar infiltrates and layering pleural effusions better seen on prior chest CT. Similar degree of generalized interstitial coarsening. Calcified subcarinal lymph node. No visible pneumothorax. Stable heart size. IMPRESSION: Unchanged airspace disease. Pleural effusions which were layering and largely occult by radiography. Electronically Signed   By: Monte Fantasia M.D.   On: 05/23/2020 05:56   CT Angio Chest PE W/Cm &/Or Wo Cm  Result Date: 05/22/2020 CLINICAL DATA:  NEG COVID test today  D Dimer 0.86  Per chart: Pt reports shortness of breath x1 week. Pt  states around christmas he mixed chemicals while cleaning. Chemical exposure EXAM: CT ANGIOGRAPHY CHEST WITH CONTRAST TECHNIQUE: Multidetector CT imaging of the chest was performed using the standard protocol during bolus administration of intravenous contrast. Multiplanar CT image reconstructions and MIPs were obtained to evaluate the vascular anatomy. CONTRAST:  147mL OMNIPAQUE IOHEXOL 350 MG/ML SOLN COMPARISON:  None. FINDINGS: Cardiovascular: No filling defects within the pulmonary arteries to suggest acute pulmonary embolism. No pericardial effusion. Coronary artery calcification and aortic atherosclerotic calcification. Mediastinum/Nodes: There are calcified mediastinal lymph nodes. No lymphadenopathy Borderline enlarged paratracheal lymph nodes. Lungs/Pleura: Diffuse perihilar airspace disease with confluent ground-glass densities. Large bilateral layering pleural effusions. Upper Abdomen: Limited view of the liver, kidneys, pancreas are unremarkable. Normal adrenal glands. Musculoskeletal: No aggressive osseous lesion. Review of the MIP images confirms the above findings. IMPRESSION: 1. No evidence acute pulmonary embolism. 2. Diffuse severe perihilar airspace disease. Bilateral large pleural effusion. Typically COVID pneumonia does not have associated pleural effusions. Additionally COVID pneumonia is typically peripherally distributed. This central airspace disease is more favored flash pulmonary edema, inhalation toxicity (given history), or atypical pneumonia. 3. Calcified mediastinal lymph nodes consistent with prior granulomatous disease. 4. Aortic Atherosclerosis (ICD10-I70.0). Electronically Signed   By: Suzy Bouchard M.D.   On: 05/22/2020 14:50   ECHOCARDIOGRAM COMPLETE  Result Date: 05/23/2020    ECHOCARDIOGRAM REPORT   Patient Name:   Derek Thompson Date of Exam: 05/23/2020 Medical Rec #:  [id number]     Height:       72.0 in Accession #:    [id number]    Weight:       167.5 lb Date of Birth:   Dec 20, 1960     BSA:          1.976 m Patient Age:    30 years      BP:           131/92 mmHg Patient Gender: M             HR:           100 bpm. Exam Location:  Inpatient Procedure: 2D Echo Indications:    Congestive Heart Failure I50.9  History:        Patient has no prior history of Echocardiogram examinations.                 Risk Factors:Hypertension and Diabetes.  Sonographer:    Mikki Santee RDCS (AE) Referring Phys: [id number] ASIA B [place]  1. Left ventricular ejection fraction, by estimation, is 25 to 30%. The left ventricle has severely decreased  function. The left ventricle demonstrates regional wall motion abnormalities (see scoring diagram/findings for description). Left ventricular diastolic parameters are indeterminate. Elevated left ventricular end-diastolic pressure. There is akinesis of the left ventricular, basal-mid inferoseptal wall and anteroseptal wall. There is akinesis of the left ventricular, apical septal wall. There is akinesis of the left ventricular, entire inferior wall. There is severe hypokinesis of the left ventricular, basal-mid inferolateral wall.  2. Right ventricular systolic function is normal. The right ventricular size is normal. Tricuspid regurgitation signal is inadequate for assessing PA pressure.  3. The mitral valve is degenerative. Mild mitral valve regurgitation. No evidence of mitral stenosis.  4. The aortic valve has an indeterminant number of cusps. Aortic valve regurgitation is not visualized. No aortic stenosis is present.  5. The inferior vena cava is normal in size with greater than 50% respiratory variability, suggesting right atrial pressure of 3 mmHg. FINDINGS  Left Ventricle: Left ventricular ejection fraction, by estimation, is 25 to 30%. The left ventricle has severely decreased function. The left ventricle demonstrates regional wall motion abnormalities. Severe hypokinesis of the left ventricular, basal-mid inferolateral wall. The  left ventricular internal cavity size was normal in size. There is no left ventricular hypertrophy. Abnormal (paradoxical) septal motion, consistent with left bundle branch block. Left ventricular diastolic parameters are indeterminate. Elevated left ventricular end-diastolic pressure. Right Ventricle: The right ventricular size is normal. No increase in right ventricular wall thickness. Right ventricular systolic function is normal. Tricuspid regurgitation signal is inadequate for assessing PA pressure. Left Atrium: Left atrial size was normal in size. Right Atrium: Right atrial size was normal in size. Pericardium: There is no evidence of pericardial effusion. Mitral Valve: The mitral valve is degenerative in appearance. There is mild calcification of the anterior mitral valve leaflet(s). Mild mitral valve regurgitation. No evidence of mitral valve stenosis. Tricuspid Valve: The tricuspid valve is normal in structure. Tricuspid valve regurgitation is not demonstrated. No evidence of tricuspid stenosis. Aortic Valve: The aortic valve has an indeterminant number of cusps. Aortic valve regurgitation is not visualized. No aortic stenosis is present. Pulmonic Valve: The pulmonic valve was normal in structure. Pulmonic valve regurgitation is not visualized. No evidence of pulmonic stenosis. Aorta: The aortic root is normal in size and structure. Venous: The inferior vena cava is normal in size with greater than 50% respiratory variability, suggesting right atrial pressure of 3 mmHg. IAS/Shunts: No atrial level shunt detected by color flow Doppler.  LEFT VENTRICLE PLAX 2D LVIDd:         5.20 cm      Diastology LVIDs:         4.40 cm      LV e' medial:    7.29 cm/s LV PW:         1.10 cm      LV E/e' medial:  15.4 LV IVS:        0.90 cm      LV e' lateral:   10.60 cm/s LVOT diam:     2.00 cm      LV E/e' lateral: 10.6 LV SV:         58 LV SV Index:   29 LVOT Area:     3.14 cm  LV Volumes (MOD) LV vol d, MOD A2C: 132.0 ml  LV vol d, MOD A4C: 136.0 ml LV vol s, MOD A2C: 80.8 ml LV vol s, MOD A4C: 91.6 ml LV SV MOD A2C:     51.2 ml LV SV MOD A4C:  136.0 ml LV SV MOD BP:      45.1 ml RIGHT VENTRICLE RV S prime:     9.79 cm/s TAPSE (M-mode): 1.6 cm LEFT ATRIUM             Index       RIGHT ATRIUM           Index LA diam:        4.20 cm 2.13 cm/m  RA Area:     10.60 cm LA Vol (A2C):   48.5 ml 24.55 ml/m RA Volume:   21.60 ml  10.93 ml/m LA Vol (A4C):   57.3 ml 29.00 ml/m LA Biplane Vol: 54.1 ml 27.38 ml/m  AORTIC VALVE LVOT Vmax:   84.20 cm/s LVOT Vmean:  57.000 cm/s LVOT VTI:    0.184 m  AORTA Ao Root diam: 2.90 cm MITRAL VALVE MV Area (PHT): 3.85 cm     SHUNTS MV Decel Time: 197 msec     Systemic VTI:  0.18 m MV E velocity: 112.00 cm/s  Systemic Diam: 2.00 cm MV A velocity: 30.00 cm/s MV E/A ratio:  3.73 Fransico Him MD Electronically signed by Fransico Him MD Signature Date/Time: 05/23/2020/10:39:38 AM    Final     Cardiac Studies   New WMAs on Echo with new EF  Patient Profile     60 y.o. male new HF  Assessment & Plan    Wide complex Tachycardia - SVT with abberancy favored given LBBB on admission and with no HD significant of tachycardia - will start metoprolol 25 mg PO BID given balance of this and HF  Heart Failure reduced Ejection Fraction  DM with HTN - NYHA class III, Stage C, hypervolemic, etiology from possible completed MI (LAD territory) - Diuretic regimen: lasix 80 mg IV BID - Strict I/Os, daily weights, and fluid restriction of < 2 L  - Replace electrolytes PRN and keep K>4 and Mg>2. - low dose BB started given balance with SVT;  - when euvolemic would be appropriate Entresto start - aldactone if persistent hypokalemia - SGLT2i start for 05/24/20 10 - when nearing euvolemia would favor CMR to see if LAD territory is viable (would need to be able to breath hold); may get LHC through this admission    For questions or updates, please contact [facility] Please consult [email or web address]  for contact info under Cardiology/STEMI.      Signed, Werner Lean, MD  05/23/2020, 11:28 AM

## 2020-05-24 LAB — BASIC METABOLIC PANEL
Anion gap: 10 (ref 5–15)
BUN: 14 mg/dL (ref 6–20)
CO2: 26 mmol/L (ref 22–32)
Calcium: 8.5 mg/dL — ABNORMAL LOW (ref 8.9–10.3)
Chloride: 98 mmol/L (ref 98–111)
Creatinine, Ser: 1.04 mg/dL (ref 0.61–1.24)
GFR, Estimated: >60 mL/min (ref >60)
Glucose, Bld: 128 mg/dL — ABNORMAL HIGH (ref 70–99)
Potassium: 3.9 mmol/L (ref 3.5–5.1)
Sodium: 134 mmol/L — ABNORMAL LOW (ref 135–145)

## 2020-05-24 LAB — CBC
HCT: 35.8 % — ABNORMAL LOW (ref 39.0–52.0)
Hemoglobin: 11.3 g/dL — ABNORMAL LOW (ref 13.0–17.0)
MCH: 28 pg (ref 26.0–34.0)
MCHC: 31.6 g/dL (ref 30.0–36.0)
MCV: 88.6 fL (ref 80.0–100.0)
Platelets: 342 10*3/uL (ref 150–400)
RBC: 4.04 MIL/uL — ABNORMAL LOW (ref 4.22–5.81)
RDW: 12.3 % (ref 11.5–15.5)
WBC: 8.9 10*3/uL (ref 4.0–10.5)
nRBC: 0 % (ref 0.0–0.2)

## 2020-05-24 LAB — LIPID PANEL
Cholesterol: 147 mg/dL (ref 0–200)
HDL: 31 mg/dL — ABNORMAL LOW (ref >40)
LDL Cholesterol: 97 mg/dL (ref 0–99)
Total CHOL/HDL Ratio: 4.7 RATIO
Triglycerides: 94 mg/dL (ref <150)
VLDL: 19 mg/dL (ref 0–40)

## 2020-05-24 LAB — GLUCOSE, CAPILLARY
Glucose-Capillary: 126 mg/dL — ABNORMAL HIGH (ref 70–99)
Glucose-Capillary: 191 mg/dL — ABNORMAL HIGH (ref 70–99)
Glucose-Capillary: 138 mg/dL — ABNORMAL HIGH (ref 70–99)
Glucose-Capillary: 191 mg/dL — ABNORMAL HIGH (ref 70–99)

## 2020-05-24 LAB — FERRITIN: Ferritin: 123 ng/mL (ref 24–336)

## 2020-05-24 MED ORDER — FUROSEMIDE 10 MG/ML IJ SOLN
100.0000 mg | Freq: Two times a day (BID) | INTRAVENOUS | Status: DC
  Administered 2020-05-24 – 2020-05-25 (×3): 100 mg via INTRAVENOUS
  Filled 2020-05-24 (×5): qty 10

## 2020-05-24 MED ORDER — FUROSEMIDE 10 MG/ML IJ SOLN: 100.0000 mg | Freq: Two times a day (BID) | INTRAVENOUS | Status: DC

## 2020-05-24 NOTE — Progress Notes (Signed)
PROGRESS NOTE    Derek Thompson  ELF:[id number]  DOB: [birth year]  DOA: 05/22/2020 PCP: Patient, No Pcp Per Outpatient Specialists:   Hospital course:  60 year old gentleman with history significant for type 2 diabetes who presented to progressive dyspnea for 1 week.  Symptoms were most consistent with heart failure including orthopnea.  In addition to dyspnea the patient had had a syncopal episode for the last week with his symptoms.  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  In the ER the patient was noted to be hypoxic and placed on 2 L nasal cannula then BiPAP for comfort.  CT angio was obtained which did not show any pulmonary embolism but did show pulmonary edema and effusions.  Cardiology was consulted.  1/22-echocardiogram returned demonstrating a marked reduction in ejection fraction of 25% With akinesis and hypokinesis throughout, supraventricular tachycardia on monitor.   Subjective:  Patient states that he feels "wonderful".  Has no complaints   Objective: Vitals:   05/24/20 0600 05/24/20 0700 05/24/20 1113 05/24/20 1629  BP: 119/81 126/90 107/69 106/70  Pulse: 82 87 88   Resp: 16 20 16 20   Temp:  98.3 F (36.8 C) 98.6 F (37 C) 98.7 F (37.1 C)  TempSrc:  Oral Oral Oral  SpO2: 99% 99% 95% 95%  Weight:      Height:        Intake/Output Summary (Last 24 hours) at 05/24/2020 1648 Last data filed at 05/24/2020 1503 Gross per 24 hour  Intake 540 ml  Output 4200 ml  Net -3660 ml   Filed Weights   05/22/20 1006 05/23/20 0400 05/24/20 0254  Weight: 81.6 kg 76 kg 75.5 kg     Exam:  General: Patient sitting up and washing himself Listening to music appears to be in good spirits.    Eyes: sclera anicteric, CVS: S1-S2, regular  Respiratory:  decreased air entry bilaterally secondary to decreased inspiratory effort, rales at bases  GI: NABS, soft, NT  LE: No edema.  Neuro: A/O x 3, Moving all extremities equally with  normal strength, CN 3-12 intact, grossly nonfocal.  Psych: patient is logical and coherent, judgement and insight appear normal, mood and affect appropriate to situation.   Assessment & Plan:  60 year old man is admitted with new onset systolic heart failure with akinesis and hypokinesis throughout and marked reduction in EF to 25% found to have wide-complex tachycardia likely SVT.  Acute systolic heart failure Ischemic versus alcoholic Patient has diuresed 4 L overnight Appreciate ongoing cardiology consultation Lasix has been increased to 100 mg twice daily with strict I's and O's Continue metoprolol 25 twice daily [place] has been started They will start Entresto when he is euvolemic Consider Aldactone if persistent hypokalemia Work-up of possible CAD is being considered either as an inpatient or outpatient  Wide-complex tachycardia Thought to be SVT with aberrancy Continue metoprolol 25 twice daily per cardiology Patient presently in sinus rhythm  Syncope Thought to be secondary low output and/or possible tachycardia May need loop monitor as an outpatient  DM2 Blood sugars under excellent control on present regimen with SSI and Farxiga   DVT prophylaxis: Heparin Code Status: Full Family Communication: None Disposition Plan:   Patient is from: Home  Anticipated Discharge Location: Home  Barriers to Discharge: Needs further diuresis and possible cardiac cath this admission  Is patient medically stable for Discharge: No   Consultants:  Cardiology  Procedures:  None  Antimicrobials:  None   Data Reviewed:  Basic Metabolic Panel: Recent Labs  Lab 05/22/20 1208 05/23/20 0155 05/23/20 1200 05/24/20 0144  NA 134* 135 131* 134*  K 4.3 4.0 4.1 3.9  CL 99 100 97* 98  CO2 25 25 24 26   GLUCOSE 169* 156* 195* 128*  BUN 13 10 11 14   CREATININE 0.84 0.83 0.89 1.04  CALCIUM 9.1 8.5* 8.4* 8.5*  MG  --  1.7  --   --    Liver Function Tests: Recent Labs   Lab 05/22/20 1208 05/23/20 0155 05/23/20 1200  AST 14* 13* 12*  ALT 17 16 14   ALKPHOS 34* 28* 30*  BILITOT 0.1* 0.7 0.4  PROT 7.2 6.1* 6.0*  ALBUMIN 3.1* 2.5* 2.4*   No results for input(s): LIPASE, AMYLASE in the last 168 hours. No results for input(s): AMMONIA in the last 168 hours. CBC: Recent Labs  Lab 05/22/20 1129 05/23/20 0155 05/23/20 1200 05/24/20 0144  WBC 9.6 10.1 9.3 8.9  NEUTROABS 6.3  --   --   --   HGB 11.6* 11.1* 10.9* 11.3*  HCT 35.8* 34.3* 32.7* 35.8*  MCV 88.6 88.9 88.1 88.6  PLT 355 291 342 342   Cardiac Enzymes: No results for input(s): CKTOTAL, CKMB, CKMBINDEX, TROPONINI in the last 168 hours. BNP (last 3 results) No results for input(s): PROBNP in the last 8760 hours. CBG: Recent Labs  Lab 05/23/20 1139 05/23/20 1650 05/23/20 2131 05/24/20 0554 05/24/20 1110  GLUCAP 204* 160* 205* 126* 191*    Recent Results (from the past 240 hour(s))  SARS Coronavirus 2 by RT PCR (hospital order, performed in [facility] hospital lab) Nasopharyngeal Nasopharyngeal Swab     Status: None   Collection Time: 05/22/20 11:29 AM   Specimen: Nasopharyngeal Swab  Result Value Ref Range Status   SARS Coronavirus 2 NEGATIVE NEGATIVE Final    Comment: (NOTE) SARS-CoV-2 target nucleic acids are NOT DETECTED.  The SARS-CoV-2 RNA is generally detectable in upper and lower respiratory specimens during the acute phase of infection. The lowest concentration of SARS-CoV-2 viral copies this assay can detect is 250 copies / mL. A negative result does not preclude SARS-CoV-2 infection and should not be used as the sole basis for treatment or other patient management decisions.  A negative result may occur with improper specimen collection / handling, submission of specimen other than nasopharyngeal swab, presence of viral mutation(s) within the areas targeted by this assay, and inadequate number of viral copies (<250 copies / mL). A negative result must be combined  with clinical observations, patient history, and epidemiological information.  Fact Sheet for Patients:   StrictlyIdeas.no  Fact Sheet for Healthcare Providers: BankingDealers.co.za  This test is not yet approved or  cleared by the [place] FDA and has been authorized for detection and/or diagnosis of SARS-CoV-2 by FDA under an Emergency Use Authorization (EUA).  This EUA will remain in effect (meaning this test can be used) for the duration of the COVID-19 declaration under Section 564(b)(1) of the Act, 21 U.S.C. section 360bbb-3(b)(1), unless the authorization is terminated or revoked sooner.  Performed at [facility], [address], [place], [place] [address]       Studies: DG Chest 2 View  Result Date: 05/23/2020 CLINICAL DATA:  Acute diastolic CHF.  Shortness of breath EXAM: CHEST - 2 VIEW COMPARISON:  Chest CT from yesterday FINDINGS: Perihilar infiltrates and layering pleural effusions better seen on prior chest CT. Similar degree of generalized interstitial coarsening. Calcified subcarinal lymph node. No visible  pneumothorax. Stable heart size. IMPRESSION: Unchanged airspace disease. Pleural effusions which were layering and largely occult by radiography. Electronically Signed   By: Monte Fantasia M.D.   On: 05/23/2020 05:56   ECHOCARDIOGRAM COMPLETE  Result Date: 05/23/2020    ECHOCARDIOGRAM REPORT   Patient Name:   Derek Thompson Date of Exam: 05/23/2020 Medical Rec #:  [id number]     Height:       72.0 in Accession #:    [id number]    Weight:       167.5 lb Date of Birth:  Sep 11, 1960     BSA:          1.976 m Patient Age:    74 years      BP:           131/92 mmHg Patient Gender: M             HR:           100 bpm. Exam Location:  Inpatient Procedure: 2D Echo Indications:    Congestive Heart Failure I50.9  History:        Patient has no prior history of Echocardiogram examinations.                 Risk  Factors:Hypertension and Diabetes.  Sonographer:    Mikki Santee RDCS (AE) Referring Phys: [id number] ASIA B [place]  1. Left ventricular ejection fraction, by estimation, is 25 to 30%. The left ventricle has severely decreased function. The left ventricle demonstrates regional wall motion abnormalities (see scoring diagram/findings for description). Left ventricular diastolic parameters are indeterminate. Elevated left ventricular end-diastolic pressure. There is akinesis of the left ventricular, basal-mid inferoseptal wall and anteroseptal wall. There is akinesis of the left ventricular, apical septal wall. There is akinesis of the left ventricular, entire inferior wall. There is severe hypokinesis of the left ventricular, basal-mid inferolateral wall.  2. Right ventricular systolic function is normal. The right ventricular size is normal. Tricuspid regurgitation signal is inadequate for assessing PA pressure.  3. The mitral valve is degenerative. Mild mitral valve regurgitation. No evidence of mitral stenosis.  4. The aortic valve has an indeterminant number of cusps. Aortic valve regurgitation is not visualized. No aortic stenosis is present.  5. The inferior vena cava is normal in size with greater than 50% respiratory variability, suggesting right atrial pressure of 3 mmHg. FINDINGS  Left Ventricle: Left ventricular ejection fraction, by estimation, is 25 to 30%. The left ventricle has severely decreased function. The left ventricle demonstrates regional wall motion abnormalities. Severe hypokinesis of the left ventricular, basal-mid inferolateral wall. The left ventricular internal cavity size was normal in size. There is no left ventricular hypertrophy. Abnormal (paradoxical) septal motion, consistent with left bundle branch block. Left ventricular diastolic parameters are indeterminate. Elevated left ventricular end-diastolic pressure. Right Ventricle: The right ventricular size is  normal. No increase in right ventricular wall thickness. Right ventricular systolic function is normal. Tricuspid regurgitation signal is inadequate for assessing PA pressure. Left Atrium: Left atrial size was normal in size. Right Atrium: Right atrial size was normal in size. Pericardium: There is no evidence of pericardial effusion. Mitral Valve: The mitral valve is degenerative in appearance. There is mild calcification of the anterior mitral valve leaflet(s). Mild mitral valve regurgitation. No evidence of mitral valve stenosis. Tricuspid Valve: The tricuspid valve is normal in structure. Tricuspid valve regurgitation is not demonstrated. No evidence of tricuspid stenosis. Aortic Valve: The aortic valve has an indeterminant number of cusps. Aortic  valve regurgitation is not visualized. No aortic stenosis is present. Pulmonic Valve: The pulmonic valve was normal in structure. Pulmonic valve regurgitation is not visualized. No evidence of pulmonic stenosis. Aorta: The aortic root is normal in size and structure. Venous: The inferior vena cava is normal in size with greater than 50% respiratory variability, suggesting right atrial pressure of 3 mmHg. IAS/Shunts: No atrial level shunt detected by color flow Doppler.  LEFT VENTRICLE PLAX 2D LVIDd:         5.20 cm      Diastology LVIDs:         4.40 cm      LV e' medial:    7.29 cm/s LV PW:         1.10 cm      LV E/e' medial:  15.4 LV IVS:        0.90 cm      LV e' lateral:   10.60 cm/s LVOT diam:     2.00 cm      LV E/e' lateral: 10.6 LV SV:         58 LV SV Index:   29 LVOT Area:     3.14 cm  LV Volumes (MOD) LV vol d, MOD A2C: 132.0 ml LV vol d, MOD A4C: 136.0 ml LV vol s, MOD A2C: 80.8 ml LV vol s, MOD A4C: 91.6 ml LV SV MOD A2C:     51.2 ml LV SV MOD A4C:     136.0 ml LV SV MOD BP:      45.1 ml RIGHT VENTRICLE RV S prime:     9.79 cm/s TAPSE (M-mode): 1.6 cm LEFT ATRIUM             Index       RIGHT ATRIUM           Index LA diam:        4.20 cm 2.13 cm/m  RA  Area:     10.60 cm LA Vol (A2C):   48.5 ml 24.55 ml/m RA Volume:   21.60 ml  10.93 ml/m LA Vol (A4C):   57.3 ml 29.00 ml/m LA Biplane Vol: 54.1 ml 27.38 ml/m  AORTIC VALVE LVOT Vmax:   84.20 cm/s LVOT Vmean:  57.000 cm/s LVOT VTI:    0.184 m  AORTA Ao Root diam: 2.90 cm MITRAL VALVE MV Area (PHT): 3.85 cm     SHUNTS MV Decel Time: 197 msec     Systemic VTI:  0.18 m MV E velocity: 112.00 cm/s  Systemic Diam: 2.00 cm MV A velocity: 30.00 cm/s MV E/A ratio:  3.73 Fransico Him MD Electronically signed by Fransico Him MD Signature Date/Time: 05/23/2020/10:39:38 AM    Final      Scheduled Meds: . dapagliflozin propanediol  10 mg Oral Daily  . heparin  5,000 Units Subcutaneous Q8H  . insulin aspart  0-15 Units Subcutaneous TID WC  . insulin aspart  0-5 Units Subcutaneous QHS  . metoprolol tartrate  25 mg Oral BID  . nitroGLYCERIN  1 inch Topical Q6H   Continuous Infusions: . furosemide 100 mg (05/24/20 1205)    Active Problems:   Pulmonary edema cardiac cause ([place])     [place] Derek Jack, [facility]  If 7PM-7AM, please contact night-coverage [email or web address] Password [facility] 05/24/2020, 4:48 PM    LOS: 2 days

## 2020-05-24 NOTE — Progress Notes (Signed)
Mobility Specialist - Progress Note   05/24/20 1340  Mobility  Activity Ambulated in hall  Level of Assistance Standby assist, set-up cues, supervision of patient - no hands on  Assistive Device  (IV Stand)  Distance Ambulated (ft) 430 ft  Mobility Response Tolerated well  Mobility performed by Mobility specialist  $Mobility charge 1 Mobility    Pre-mobility, 3L O2: 86 HR, 96% SpO2 During mobility, 3L O2: 98 HR, 100% SpO2 Post-mobility, 2L O2: 81 HR, 100 % SpO2  Pt asx throughout ambulation. Pt back in bed after walk and O2 dec to 2L.   Pricilla Handler Mobility Specialist Mobility Specialist Phone: [phone]

## 2020-05-24 NOTE — Progress Notes (Signed)
Progress Note  Patient Name: Derek Thompson Date of Encounter: 05/24/2020  Primary Cardiologist: Hilty- New   Subjective   60 yo M with DM with HTN who had NYE time Nausea and Diaphoresis.  Saw Ed after that time (05/03/20) with relatively benign work up.  Has noted progress SOB, PND, Orthopnea. Since.  05/23/20 AM  new SVT w aberrancy.  With this had no symptoms. Spontaneously converted to SR.  Echo performed while in SR was notable for new WMA and EF ~ 25%.  Breathing is much improved, no chest pain.  O2 has decreased down to 3 L without hypoxia.  Inpatient Medications    Scheduled Meds: . dapagliflozin propanediol  10 mg Oral Daily  . furosemide  80 mg Intravenous BID  . heparin  5,000 Units Subcutaneous Q8H  . influenza vac split quadrivalent PF  0.5 mL Intramuscular Tomorrow-1000  . insulin aspart  0-15 Units Subcutaneous TID WC  . insulin aspart  0-5 Units Subcutaneous QHS  . metoprolol tartrate  25 mg Oral BID  . nitroGLYCERIN  1 inch Topical Q6H   Continuous Infusions:   PRN Meds: acetaminophen **OR** acetaminophen, morphine injection, ondansetron **OR** ondansetron (ZOFRAN) IV, oxyCODONE, polyethylene glycol   Vital Signs    Vitals:   05/24/20 0000 05/24/20 0254 05/24/20 0600 05/24/20 0700  BP: 106/79 111/70 119/81 126/90  Pulse: 85 92 82 87  Resp: 16 18 16 20   Temp:  98.7 F (37.1 C)  98.3 F (36.8 C)  TempSrc:  Oral  Oral  SpO2: 98% 97% 99% 99%  Weight:  75.5 kg    Height:        Intake/Output Summary (Last 24 hours) at 05/24/2020 0825 Last data filed at 05/24/2020 0254 Gross per 24 hour  Intake 600 ml  Output 4200 ml  Net -3600 ml   Filed Weights   05/22/20 1006 05/23/20 0400 05/24/20 0254  Weight: 81.6 kg 76 kg 75.5 kg    Telemetry    One spike of SVT with aberrancy 2015 with quick resolution - Personally Reviewed  ECG    No new- Personally Reviewed  Physical Exam   GEN: No acute distress.   Neck:  JVD to mid neck Cardiac: RRR, no  murmurs, rubs, or gallops.  Respiratory: Coarse breath sounds in all lung fields, good air movement GI: Soft, nontender, non-distended  MS: No edema; No deformity. Neuro:  Nonfocal  Psych: Normal affect   Labs    Chemistry Recent Labs  Lab 05/22/20 1208 05/23/20 0155 05/23/20 1200 05/24/20 0144  NA 134* 135 131* 134*  K 4.3 4.0 4.1 3.9  CL 99 100 97* 98  CO2 25 25 24 26   GLUCOSE 169* 156* 195* 128*  BUN 13 10 11 14   CREATININE 0.84 0.83 0.89 1.04  CALCIUM 9.1 8.5* 8.4* 8.5*  PROT 7.2 6.1* 6.0*  --   ALBUMIN 3.1* 2.5* 2.4*  --   AST 14* 13* 12*  --   ALT 17 16 14   --   ALKPHOS 34* 28* 30*  --   BILITOT 0.1* 0.7 0.4  --   GFRNONAA >60 >60 >60 >60  ANIONGAP 10 10 10 10      Hematology Recent Labs  Lab 05/23/20 0155 05/23/20 1200 05/24/20 0144  WBC 10.1 9.3 8.9  RBC 3.86* 3.71* 4.04*  HGB 11.1* 10.9* 11.3*  HCT 34.3* 32.7* 35.8*  MCV 88.9 88.1 88.6  MCH 28.8 29.4 28.0  MCHC 32.4 33.3 31.6  RDW 12.6 12.7 12.3  PLT 291 342 342    Cardiac EnzymesNo results for input(s): TROPONINI in the last 168 hours. No results for input(s): TROPIPOC in the last 168 hours.   BNP Recent Labs  Lab 05/22/20 1129  BNP 636.6*     DDimer  Recent Labs  Lab 05/22/20 1208  DDIMER 0.86*     Radiology    DG Chest 2 View  Result Date: 05/23/2020 CLINICAL DATA:  Acute diastolic CHF.  Shortness of breath EXAM: CHEST - 2 VIEW COMPARISON:  Chest CT from yesterday FINDINGS: Perihilar infiltrates and layering pleural effusions better seen on prior chest CT. Similar degree of generalized interstitial coarsening. Calcified subcarinal lymph node. No visible pneumothorax. Stable heart size. IMPRESSION: Unchanged airspace disease. Pleural effusions which were layering and largely occult by radiography. Electronically Signed   By: Monte Fantasia M.D.   On: 05/23/2020 05:56   CT Angio Chest PE W/Cm &/Or Wo Cm  Result Date: 05/22/2020 CLINICAL DATA:  NEG COVID test today  D Dimer 0.86  Per  chart: Pt reports shortness of breath x1 week. Pt states around christmas he mixed chemicals while cleaning. Chemical exposure EXAM: CT ANGIOGRAPHY CHEST WITH CONTRAST TECHNIQUE: Multidetector CT imaging of the chest was performed using the standard protocol during bolus administration of intravenous contrast. Multiplanar CT image reconstructions and MIPs were obtained to evaluate the vascular anatomy. CONTRAST:  144mL OMNIPAQUE IOHEXOL 350 MG/ML SOLN COMPARISON:  None. FINDINGS: Cardiovascular: No filling defects within the pulmonary arteries to suggest acute pulmonary embolism. No pericardial effusion. Coronary artery calcification and aortic atherosclerotic calcification. Mediastinum/Nodes: There are calcified mediastinal lymph nodes. No lymphadenopathy Borderline enlarged paratracheal lymph nodes. Lungs/Pleura: Diffuse perihilar airspace disease with confluent ground-glass densities. Large bilateral layering pleural effusions. Upper Abdomen: Limited view of the liver, kidneys, pancreas are unremarkable. Normal adrenal glands. Musculoskeletal: No aggressive osseous lesion. Review of the MIP images confirms the above findings. IMPRESSION: 1. No evidence acute pulmonary embolism. 2. Diffuse severe perihilar airspace disease. Bilateral large pleural effusion. Typically COVID pneumonia does not have associated pleural effusions. Additionally COVID pneumonia is typically peripherally distributed. This central airspace disease is more favored flash pulmonary edema, inhalation toxicity (given history), or atypical pneumonia. 3. Calcified mediastinal lymph nodes consistent with prior granulomatous disease. 4. Aortic Atherosclerosis (ICD10-I70.0). Electronically Signed   By: Suzy Bouchard M.D.   On: 05/22/2020 14:50   ECHOCARDIOGRAM COMPLETE  Result Date: 05/23/2020    ECHOCARDIOGRAM REPORT   Patient Name:   Derek Thompson Date of Exam: 05/23/2020 Medical Rec #:  [id number]     Height:       72.0 in Accession #:     [id number]    Weight:       167.5 lb Date of Birth:  1960/06/17     BSA:          1.976 m Patient Age:    70 years      BP:           131/92 mmHg Patient Gender: M             HR:           100 bpm. Exam Location:  Inpatient Procedure: 2D Echo Indications:    Congestive Heart Failure I50.9  History:        Patient has no prior history of Echocardiogram examinations.                 Risk Factors:Hypertension and Diabetes.  Sonographer:    Mikki Santee RDCS (AE)  Referring Phys: [id number] ASIA B [place]  1. Left ventricular ejection fraction, by estimation, is 25 to 30%. The left ventricle has severely decreased function. The left ventricle demonstrates regional wall motion abnormalities (see scoring diagram/findings for description). Left ventricular diastolic parameters are indeterminate. Elevated left ventricular end-diastolic pressure. There is akinesis of the left ventricular, basal-mid inferoseptal wall and anteroseptal wall. There is akinesis of the left ventricular, apical septal wall. There is akinesis of the left ventricular, entire inferior wall. There is severe hypokinesis of the left ventricular, basal-mid inferolateral wall.  2. Right ventricular systolic function is normal. The right ventricular size is normal. Tricuspid regurgitation signal is inadequate for assessing PA pressure.  3. The mitral valve is degenerative. Mild mitral valve regurgitation. No evidence of mitral stenosis.  4. The aortic valve has an indeterminant number of cusps. Aortic valve regurgitation is not visualized. No aortic stenosis is present.  5. The inferior vena cava is normal in size with greater than 50% respiratory variability, suggesting right atrial pressure of 3 mmHg. FINDINGS  Left Ventricle: Left ventricular ejection fraction, by estimation, is 25 to 30%. The left ventricle has severely decreased function. The left ventricle demonstrates regional wall motion abnormalities. Severe hypokinesis of the  left ventricular, basal-mid inferolateral wall. The left ventricular internal cavity size was normal in size. There is no left ventricular hypertrophy. Abnormal (paradoxical) septal motion, consistent with left bundle branch block. Left ventricular diastolic parameters are indeterminate. Elevated left ventricular end-diastolic pressure. Right Ventricle: The right ventricular size is normal. No increase in right ventricular wall thickness. Right ventricular systolic function is normal. Tricuspid regurgitation signal is inadequate for assessing PA pressure. Left Atrium: Left atrial size was normal in size. Right Atrium: Right atrial size was normal in size. Pericardium: There is no evidence of pericardial effusion. Mitral Valve: The mitral valve is degenerative in appearance. There is mild calcification of the anterior mitral valve leaflet(s). Mild mitral valve regurgitation. No evidence of mitral valve stenosis. Tricuspid Valve: The tricuspid valve is normal in structure. Tricuspid valve regurgitation is not demonstrated. No evidence of tricuspid stenosis. Aortic Valve: The aortic valve has an indeterminant number of cusps. Aortic valve regurgitation is not visualized. No aortic stenosis is present. Pulmonic Valve: The pulmonic valve was normal in structure. Pulmonic valve regurgitation is not visualized. No evidence of pulmonic stenosis. Aorta: The aortic root is normal in size and structure. Venous: The inferior vena cava is normal in size with greater than 50% respiratory variability, suggesting right atrial pressure of 3 mmHg. IAS/Shunts: No atrial level shunt detected by color flow Doppler.  LEFT VENTRICLE PLAX 2D LVIDd:         5.20 cm      Diastology LVIDs:         4.40 cm      LV e' medial:    7.29 cm/s LV PW:         1.10 cm      LV E/e' medial:  15.4 LV IVS:        0.90 cm      LV e' lateral:   10.60 cm/s LVOT diam:     2.00 cm      LV E/e' lateral: 10.6 LV SV:         58 LV SV Index:   29 LVOT Area:      3.14 cm  LV Volumes (MOD) LV vol d, MOD A2C: 132.0 ml LV vol d, MOD A4C: 136.0 ml LV vol s, MOD A2C:  80.8 ml LV vol s, MOD A4C: 91.6 ml LV SV MOD A2C:     51.2 ml LV SV MOD A4C:     136.0 ml LV SV MOD BP:      45.1 ml RIGHT VENTRICLE RV S prime:     9.79 cm/s TAPSE (M-mode): 1.6 cm LEFT ATRIUM             Index       RIGHT ATRIUM           Index LA diam:        4.20 cm 2.13 cm/m  RA Area:     10.60 cm LA Vol (A2C):   48.5 ml 24.55 ml/m RA Volume:   21.60 ml  10.93 ml/m LA Vol (A4C):   57.3 ml 29.00 ml/m LA Biplane Vol: 54.1 ml 27.38 ml/m  AORTIC VALVE LVOT Vmax:   84.20 cm/s LVOT Vmean:  57.000 cm/s LVOT VTI:    0.184 m  AORTA Ao Root diam: 2.90 cm MITRAL VALVE MV Area (PHT): 3.85 cm     SHUNTS MV Decel Time: 197 msec     Systemic VTI:  0.18 m MV E velocity: 112.00 cm/s  Systemic Diam: 2.00 cm MV A velocity: 30.00 cm/s MV E/A ratio:  3.73 Fransico Him MD Electronically signed by Fransico Him MD Signature Date/Time: 05/23/2020/10:39:38 AM    Final     Cardiac Studies   New WMAs on Echo with new EF  Patient Profile     60 y.o. male new HF  Assessment & Plan    Wide complex Tachycardia - SVT with abberancy favored given LBBB on admission and with no HD significant of tachycardia - On metoprolol 25 mg PO BID given balance of this and HF  Heart Failure reduced Ejection Fraction  DM with HTN - NYHA class III, Stage C, hypervolemic, etiology from possible completed MI (LAD territory) - Diuretic regimen: increase to lasix 100 mg IV BI; - 3.6 L negative on 80 mg IV BID - Strict I/Os, daily weights, and fluid restriction of < 2 L  - Replace electrolytes PRN and keep K>4 and Mg>2. - low dose BB started given balance with SVT;  - when euvolemic would be appropriate Entresto start - aldactone if persistent hypokalemia - discussed with patient; unclear if cost will be an issue for SGLT2i; will defer for today and re-assess peridischarge - when nearing euvolemia would favor CMR to see if LAD  territory is viable (would need to be able to breath hold); may get LHC through this admission   Discussed with patient who had no further questions  For questions or updates, please contact [facility] Please consult [email or web address] for contact info under Cardiology/STEMI.      Signed, Werner Lean, MD  05/24/2020, 8:25 AM

## 2020-05-25 ENCOUNTER — Encounter (HOSPITAL_COMMUNITY)
Admission: EM | Disposition: A | Discharge status: Home / Self Care | Payer: Self-pay | Source: Home / Self Care | Attending: Internal Medicine

## 2020-05-25 ENCOUNTER — Encounter (HOSPITAL_COMMUNITY): Payer: Self-pay | Admitting: Cardiology

## 2020-05-25 DIAGNOSIS — I5021 Acute systolic (congestive) heart failure: Secondary | ICD-10-CM

## 2020-05-25 DIAGNOSIS — I5042 Chronic combined systolic (congestive) and diastolic (congestive) heart failure: Secondary | ICD-10-CM

## 2020-05-25 DIAGNOSIS — E876 Hypokalemia: Secondary | ICD-10-CM

## 2020-05-25 DIAGNOSIS — I420 Dilated cardiomyopathy: Secondary | ICD-10-CM

## 2020-05-25 DIAGNOSIS — I5023 Acute on chronic systolic (congestive) heart failure: Secondary | ICD-10-CM

## 2020-05-25 DIAGNOSIS — E118 Type 2 diabetes mellitus with unspecified complications: Secondary | ICD-10-CM

## 2020-05-25 DIAGNOSIS — R55 Syncope and collapse: Secondary | ICD-10-CM

## 2020-05-25 DIAGNOSIS — I5022 Chronic systolic (congestive) heart failure: Secondary | ICD-10-CM

## 2020-05-25 HISTORY — PX: RIGHT/LEFT HEART CATH AND CORONARY ANGIOGRAPHY: CATH118266

## 2020-05-25 LAB — MAGNESIUM: Magnesium: 2.1 mg/dL (ref 1.7–2.4)

## 2020-05-25 LAB — BASIC METABOLIC PANEL
Anion gap: 11 (ref 5–15)
BUN: 16 mg/dL (ref 6–20)
CO2: 31 mmol/L (ref 22–32)
Calcium: 8.7 mg/dL — ABNORMAL LOW (ref 8.9–10.3)
Chloride: 91 mmol/L — ABNORMAL LOW (ref 98–111)
Creatinine, Ser: 1.25 mg/dL — ABNORMAL HIGH (ref 0.61–1.24)
GFR, Estimated: >60 mL/min (ref >60)
Glucose, Bld: 178 mg/dL — ABNORMAL HIGH (ref 70–99)
Potassium: 4.1 mmol/L (ref 3.5–5.1)
Sodium: 133 mmol/L — ABNORMAL LOW (ref 135–145)

## 2020-05-25 LAB — GLUCOSE, CAPILLARY
Glucose-Capillary: 163 mg/dL — ABNORMAL HIGH (ref 70–99)
Glucose-Capillary: 235 mg/dL — ABNORMAL HIGH (ref 70–99)
Glucose-Capillary: 191 mg/dL — ABNORMAL HIGH (ref 70–99)
Glucose-Capillary: 195 mg/dL — ABNORMAL HIGH (ref 70–99)

## 2020-05-25 SURGERY — RIGHT/LEFT HEART CATH AND CORONARY ANGIOGRAPHY
Anesthesia: LOCAL

## 2020-05-25 MED ORDER — HEPARIN (PORCINE) IN NACL 1000-0.9 UT/500ML-% IV SOLN
INTRAVENOUS | Status: DC | PRN
  Administered 2020-05-25 (×2): 500 mL

## 2020-05-25 MED ORDER — HEPARIN (PORCINE) IN NACL 1000-0.9 UT/500ML-% IV SOLN
INTRAVENOUS | Status: AC
  Filled 2020-05-25: qty 1000

## 2020-05-25 MED ORDER — VERAPAMIL HCL 2.5 MG/ML IV SOLN
INTRAVENOUS | Status: AC
  Filled 2020-05-25: qty 2

## 2020-05-25 MED ORDER — LIDOCAINE HCL (PF) 1 % IJ SOLN
INTRAMUSCULAR | Status: DC | PRN
  Administered 2020-05-25: 2 mL

## 2020-05-25 MED ORDER — HEPARIN SODIUM (PORCINE) 1000 UNIT/ML IJ SOLN
INTRAMUSCULAR | Status: DC | PRN
  Administered 2020-05-25: 4000 [IU] via INTRAVENOUS

## 2020-05-25 MED ORDER — SODIUM CHLORIDE 0.9 % IV SOLN: INTRAVENOUS | Status: DC

## 2020-05-25 MED ORDER — SODIUM CHLORIDE 0.9% FLUSH
3.0000 mL | Freq: Two times a day (BID) | INTRAVENOUS | Status: DC
  Administered 2020-05-25 – 2020-05-26 (×2): 3 mL via INTRAVENOUS

## 2020-05-25 MED ORDER — SODIUM CHLORIDE 0.9% FLUSH: 3.0000 mL | INTRAVENOUS | Status: DC | PRN

## 2020-05-25 MED ORDER — SODIUM CHLORIDE 0.9 % IV SOLN: 250.0000 mL | INTRAVENOUS | Status: DC | PRN

## 2020-05-25 MED ORDER — LIDOCAINE HCL (PF) 1 % IJ SOLN
INTRAMUSCULAR | Status: AC
  Filled 2020-05-25: qty 30

## 2020-05-25 MED ORDER — IOHEXOL 350 MG/ML SOLN
INTRAVENOUS | Status: DC | PRN
  Administered 2020-05-25: 50 mL

## 2020-05-25 MED ORDER — SPIRONOLACTONE 12.5 MG HALF TABLET
12.5000 mg | ORAL_TABLET | Freq: Every day | ORAL | Status: DC
  Administered 2020-05-26: 12.5 mg via ORAL
  Filled 2020-05-25: qty 1

## 2020-05-25 MED ORDER — SODIUM CHLORIDE 0.9% FLUSH
3.0000 mL | Freq: Two times a day (BID) | INTRAVENOUS | Status: DC
  Administered 2020-05-25: 3 mL via INTRAVENOUS

## 2020-05-25 MED ORDER — FUROSEMIDE 40 MG PO TABS: 40.0000 mg | ORAL_TABLET | Freq: Every day | ORAL | Status: DC

## 2020-05-25 MED ORDER — HEPARIN SODIUM (PORCINE) 1000 UNIT/ML IJ SOLN
INTRAMUSCULAR | Status: AC
  Filled 2020-05-25: qty 1

## 2020-05-25 MED ORDER — ENOXAPARIN SODIUM 40 MG/0.4ML ~~LOC~~ SOLN
40.0000 mg | SUBCUTANEOUS | Status: DC
  Administered 2020-05-26: 40 mg via SUBCUTANEOUS
  Filled 2020-05-25: qty 0.4

## 2020-05-25 MED ORDER — SODIUM CHLORIDE 0.9 % WEIGHT BASED INFUSION
1.0000 mL/kg/h | INTRAVENOUS | Status: AC
  Administered 2020-05-25: 1 mL/kg/h via INTRAVENOUS

## 2020-05-25 MED ORDER — VERAPAMIL HCL 2.5 MG/ML IV SOLN
INTRAVENOUS | Status: DC | PRN
  Administered 2020-05-25: 10 mL via INTRA_ARTERIAL

## 2020-05-25 SURGICAL SUPPLY — 11 items

## 2020-05-25 NOTE — Progress Notes (Signed)
Pt received from cath lab. TR band level 0. VSS. Will continue to monitor.  Clyde Canterbury, RN

## 2020-05-25 NOTE — TOC Benefit Eligibility Note (Signed)
Transition of Care [facility]) Benefit Eligibility Note    Patient Details  Name: Derek Thompson MRN: [id number] Date of Birth: 02-27-1961   Medication/Dose: Wilder Glade 10mg . daily 30 day supply  Covered?: Yes  Tier:  (?)  Prescription Coverage [facility]  Spoke with Person/Company/Phone Number:: Angela L. W/[facility] [phone]  Co-Pay: $132.58  Prior Approval: No  Deductible: Unmet       [name] Phone Number: 05/25/2020, 10:58 AM

## 2020-05-25 NOTE — Interval H&P Note (Signed)
History and Physical Interval Note:  05/25/2020 1:11 PM  Derek Thompson  has presented today for surgery, with the diagnosis of chf - cp.  The various methods of treatment have been discussed with the patient and family. After consideration of risks, benefits and other options for treatment, the patient has consented to  Procedure(s): RIGHT/LEFT HEART CATH AND CORONARY ANGIOGRAPHY (N/A) as a surgical intervention.  The patient's history has been reviewed, patient examined, no change in status, stable for surgery.  I have reviewed the patient's chart and labs.  Questions were answered to the patient's satisfaction.   Cath Lab Visit (complete for each Cath Lab visit)  Clinical Evaluation Leading to the Procedure:   ACS: No.  Non-ACS:    Anginal Classification: CCS I  Anti-ischemic medical therapy: Maximal Therapy (2 or more classes of medications)  Non-Invasive Test Results: No non-invasive testing performed  Prior CABG: No previous CABG        Collier Salina [facility] 05/25/2020 1:11 PM

## 2020-05-25 NOTE — Progress Notes (Signed)
PROGRESS NOTE  Macky Galik WUJ:[id number] DOB: [birth year] DOA: 05/22/2020 PCP: Patient, No Pcp Per  Brief History   60 year old gentleman with history significant for type 2 diabetes who presented to progressive dyspnea for 1 week. Symptoms were most consistent with heart failure including orthopnea. In addition to dyspnea the patient had had a syncopal episode for the last week with his symptoms. 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 In the ER the patient was noted to be hypoxic and placed on 2 L nasal cannulathen BiPAP for comfort. CT angio was obtained which did not show any pulmonary embolism but did show pulmonary edema and effusions. Cardiology was consulted.  1/22-echocardiogram returneddemonstrating a marked reduction in ejection fraction of 25% With akinesis and hypokinesis throughout, supraventricular tachycardia on monitor.  Cardiology plans to perform LHC later today.  Consultants  . Cardiology  Procedures  . None  Antibiotics   Anti-infectives (From admission, onward)   None    .  Subjective  The patient is resting quietly. No new complaints.   Objective   Vitals:  Vitals:   05/25/20 1421 05/25/20 1609  BP: (!) 143/95 109/70  Pulse: 87 84  Resp: 17 16  Temp: 98.3 F (36.8 C) 99.6 F (37.6 C)  SpO2: 98% 95%    Exam:  Constitutional:  . The patient is awake, alert, and oriented x 3. No acute distress. Respiratory:  . No increased work of breathing. . No wheezes, rales, or rhonchi . No tactile fremitus Cardiovascular:  . Regular rate and rhythm . No murmurs, ectopy, or gallups. . No lateral PMI. No thrills. Abdomen:  . Abdomen is soft, non-tender, non-distended . No hernias, masses, or organomegaly . Normoactive bowel sounds.  Musculoskeletal:  . No cyanosis, clubbing, or edema Skin:  . No rashes, lesions, ulcers . palpation of skin: no induration or nodules Neurologic:  . CN 2-12  intact . Sensation all 4 extremities intact Psychiatric:  . Mental status o Mood, affect appropriate o Orientation to person, place, time  . judgment and insight appear intact   I have personally reviewed the following:   Today's Data  . Vitals, BMP  Imaging  . CTA chest . CXR  Cardiology Data  . EKG . Echocardiogram  Scheduled Meds: . dapagliflozin propanediol  10 mg Oral Daily  . [START ON 05/26/2020] enoxaparin (LOVENOX) injection  40 mg Subcutaneous Q24H  . insulin aspart  0-15 Units Subcutaneous TID WC  . insulin aspart  0-5 Units Subcutaneous QHS  . metoprolol tartrate  25 mg Oral BID  . nitroGLYCERIN  1 inch Topical Q6H  . [START ON 05/26/2020] sodium chloride flush  3 mL Intravenous Q12H  . [START ON 05/26/2020] spironolactone  12.5 mg Oral Daily   Continuous Infusions: . [START ON 05/26/2020] sodium chloride    . sodium chloride 1 mL/kg/hr (05/25/20 1426)    Active Problems:   Pulmonary edema cardiac cause (HCC)   Acute systolic CHF (congestive heart failure), NYHA class 3 (HCC)   LOS: 3 days   A & P  59 year old man is admitted with new onset systolic heart failure with akinesis and hypokinesis throughout and marked reduction in EF to 25% found to have wide-complex tachycardia likely SVT.  Acute systolic heart failure: Ischemic versus alcoholic. Cardiology has been consulted. The patient currently has a 4.2 liter negative fluid balance. Plan is for cardiology to take the patient for LHC later today. Lasix has been increased to 100 mg twice daily  with strict I's and O's. Continue metoprolol 25 twice daily. Wilder Glade has been started. They will start Entresto when he is euvolemic. Consider Aldactone if persistent hypokalemia.  Wide-complex tachycardia: Cardiology consulted. The patient will undergo LHC today. Thought to be SVT with aberrancy. Continue metoprolol 25 twice daily per cardiology. Patient presently in sinus rhythm.  Syncope: Thought to be secondary  low output and/or possible tachycardia. May need loop monitor as an outpatient.  DM2: Blood sugars under excellent control on present regimen with SSI and Farxiga.  I have seen and examined this patient myself. I have spent 34 minutes in his evaluation and care.  DVT prophylaxis: Heparin Code Status: Full Family Communication: None Disposition Plan:   Patient is from: Home  Anticipated Discharge Location: Home  Barriers to Discharge: Needs further diuresis and possible cardiac cath this admission  Is patient medically stable for Discharge: No  Foye Damron, DO [facility] Direct contact: see [email or web address]  7PM-7AM contact night coverage as above 05/25/2020, 4:48 PM  LOS: 3 days

## 2020-05-25 NOTE — Progress Notes (Signed)
Mobility Specialist - Progress Note   05/25/20 1134  Mobility  Activity Ambulated in hall  Level of Assistance Standby assist, set-up cues, supervision of patient - no hands on  Assistive Device None  Distance Ambulated (ft) 430 ft  Mobility Response Tolerated well  Mobility performed by Mobility specialist  $Mobility charge 1 Mobility   Pre-mobility, 2L O2: 80 HR, 100% SpO2 During mobility, 1L O2: 90 HR, 97% SpO2 Post-mobility, RA: 88 HR, 95% SpO2  Pt asx throughout ambulation. His SpO2 was 98% while ambulating on 2L, so he was dec. to 1L, then to RA after ambulation. Pt on BSC after walk, RN in room.   Pricilla Handler Mobility Specialist Mobility Specialist Phone: [phone]

## 2020-05-25 NOTE — H&P (View-Only) (Signed)
Progress Note  Patient Name: Derek Thompson Date of Encounter: 05/25/2020  [facility] Cardiologist: No primary care provider on file.   Subjective   Denies any CP, breathing ok.   Inpatient Medications    Scheduled Meds: . dapagliflozin propanediol  10 mg Oral Daily  . heparin  5,000 Units Subcutaneous Q8H  . insulin aspart  0-15 Units Subcutaneous TID WC  . insulin aspart  0-5 Units Subcutaneous QHS  . metoprolol tartrate  25 mg Oral BID  . nitroGLYCERIN  1 inch Topical Q6H   Continuous Infusions: . furosemide 100 mg (05/25/20 0756)   PRN Meds: acetaminophen **OR** acetaminophen, morphine injection, ondansetron **OR** ondansetron (ZOFRAN) IV, oxyCODONE, polyethylene glycol   Vital Signs    Vitals:   05/24/20 2000 05/25/20 0004 05/25/20 0539 05/25/20 0752  BP: 122/85 109/77 116/73 106/71  Pulse: 92 82 81 85  Resp: 20 20 20 19   Temp:  98.8 F (37.1 C) 98 F (36.7 C) (!) 97.5 F (36.4 C)  TempSrc:  Oral Oral Oral  SpO2: 93% 95% 97% 100%  Weight:   72.5 kg   Height:        Intake/Output Summary (Last 24 hours) at 05/25/2020 0820 Last data filed at 05/25/2020 0753 Gross per 24 hour  Intake 900 ml  Output -  Net 900 ml   Last 3 Weights 05/25/2020 05/24/2020 05/23/2020  Weight (lbs) 159 lb 14.4 oz 166 lb 8 oz 167 lb 8 oz  Weight (kg) 72.53 kg 75.524 kg 75.978 kg      Telemetry    NSR with LBBB, no recurrent SVT- Personally Reviewed  ECG    Wide complex tachycardia on EKG 1/22 - Personally Reviewed  Physical Exam   GEN: No acute distress.   Neck: No JVD Cardiac: RRR, no murmurs, rubs, or gallops.  Respiratory: Clear to auscultation bilaterally. GI: Soft, nontender, non-distended  MS: No edema; No deformity. Neuro:  Nonfocal  Psych: Normal affect   Labs    High Sensitivity Troponin:   Recent Labs  Lab 05/22/20 1208 05/22/20 1426  TROPONINIHS 12 12      Chemistry Recent Labs  Lab 05/22/20 1208 05/23/20 0155 05/23/20 1200 05/24/20 0144  05/25/20 0145  NA 134* 135 131* 134* 133*  K 4.3 4.0 4.1 3.9 4.1  CL 99 100 97* 98 91*  CO2 25 25 24 26 31   GLUCOSE 169* 156* 195* 128* 178*  BUN 13 10 11 14 16   CREATININE 0.84 0.83 0.89 1.04 1.25*  CALCIUM 9.1 8.5* 8.4* 8.5* 8.7*  PROT 7.2 6.1* 6.0*  --   --   ALBUMIN 3.1* 2.5* 2.4*  --   --   AST 14* 13* 12*  --   --   ALT 17 16 14   --   --   ALKPHOS 34* 28* 30*  --   --   BILITOT 0.1* 0.7 0.4  --   --   GFRNONAA >60 >60 >60 >60 >60  ANIONGAP 10 10 10 10 11      Hematology Recent Labs  Lab 05/23/20 0155 05/23/20 1200 05/24/20 0144  WBC 10.1 9.3 8.9  RBC 3.86* 3.71* 4.04*  HGB 11.1* 10.9* 11.3*  HCT 34.3* 32.7* 35.8*  MCV 88.9 88.1 88.6  MCH 28.8 29.4 28.0  MCHC 32.4 33.3 31.6  RDW 12.6 12.7 12.3  PLT 291 342 342    BNP Recent Labs  Lab 05/22/20 1129  BNP 636.6*     DDimer  Recent Labs  Lab 05/22/20  [place] 0.86*     Radiology    ECHOCARDIOGRAM COMPLETE  Result Date: 05/23/2020    ECHOCARDIOGRAM REPORT   Patient Name:   OMAR GAYDEN Date of Exam: 05/23/2020 Medical Rec #:  [id number]     Height:       72.0 in Accession #:    [id number]    Weight:       167.5 lb Date of Birth:  [birth year]     BSA:          1.976 m Patient Age:    60 years      BP:           131/92 mmHg Patient Gender: M             HR:           100 bpm. Exam Location:  Inpatient Procedure: 2D Echo Indications:    Congestive Heart Failure I50.9  History:        Patient has no prior history of Echocardiogram examinations.                 Risk Factors:Hypertension and Diabetes.  Sonographer:    Mikki Santee RDCS (AE) Referring Phys: [id number] ASIA B [place]  1. Left ventricular ejection fraction, by estimation, is 25 to 30%. The left ventricle has severely decreased function. The left ventricle demonstrates regional wall motion abnormalities (see scoring diagram/findings for description). Left ventricular diastolic parameters are indeterminate. Elevated left ventricular  end-diastolic pressure. There is akinesis of the left ventricular, basal-mid inferoseptal wall and anteroseptal wall. There is akinesis of the left ventricular, apical septal wall. There is akinesis of the left ventricular, entire inferior wall. There is severe hypokinesis of the left ventricular, basal-mid inferolateral wall.  2. Right ventricular systolic function is normal. The right ventricular size is normal. Tricuspid regurgitation signal is inadequate for assessing PA pressure.  3. The mitral valve is degenerative. Mild mitral valve regurgitation. No evidence of mitral stenosis.  4. The aortic valve has an indeterminant number of cusps. Aortic valve regurgitation is not visualized. No aortic stenosis is present.  5. The inferior vena cava is normal in size with greater than 50% respiratory variability, suggesting right atrial pressure of 3 mmHg. FINDINGS  Left Ventricle: Left ventricular ejection fraction, by estimation, is 25 to 30%. The left ventricle has severely decreased function. The left ventricle demonstrates regional wall motion abnormalities. Severe hypokinesis of the left ventricular, basal-mid inferolateral wall. The left ventricular internal cavity size was normal in size. There is no left ventricular hypertrophy. Abnormal (paradoxical) septal motion, consistent with left bundle branch block. Left ventricular diastolic parameters are indeterminate. Elevated left ventricular end-diastolic pressure. Right Ventricle: The right ventricular size is normal. No increase in right ventricular wall thickness. Right ventricular systolic function is normal. Tricuspid regurgitation signal is inadequate for assessing PA pressure. Left Atrium: Left atrial size was normal in size. Right Atrium: Right atrial size was normal in size. Pericardium: There is no evidence of pericardial effusion. Mitral Valve: The mitral valve is degenerative in appearance. There is mild calcification of the anterior mitral valve  leaflet(s). Mild mitral valve regurgitation. No evidence of mitral valve stenosis. Tricuspid Valve: The tricuspid valve is normal in structure. Tricuspid valve regurgitation is not demonstrated. No evidence of tricuspid stenosis. Aortic Valve: The aortic valve has an indeterminant number of cusps. Aortic valve regurgitation is not visualized. No aortic stenosis is present. Pulmonic Valve: The pulmonic valve was normal in structure. Pulmonic valve regurgitation  is not visualized. No evidence of pulmonic stenosis. Aorta: The aortic root is normal in size and structure. Venous: The inferior vena cava is normal in size with greater than 50% respiratory variability, suggesting right atrial pressure of 3 mmHg. IAS/Shunts: No atrial level shunt detected by color flow Doppler.  LEFT VENTRICLE PLAX 2D LVIDd:         5.20 cm      Diastology LVIDs:         4.40 cm      LV e' medial:    7.29 cm/s LV PW:         1.10 cm      LV E/e' medial:  15.4 LV IVS:        0.90 cm      LV e' lateral:   10.60 cm/s LVOT diam:     2.00 cm      LV E/e' lateral: 10.6 LV SV:         58 LV SV Index:   29 LVOT Area:     3.14 cm  LV Volumes (MOD) LV vol d, MOD A2C: 132.0 ml LV vol d, MOD A4C: 136.0 ml LV vol s, MOD A2C: 80.8 ml LV vol s, MOD A4C: 91.6 ml LV SV MOD A2C:     51.2 ml LV SV MOD A4C:     136.0 ml LV SV MOD BP:      45.1 ml RIGHT VENTRICLE RV S prime:     9.79 cm/s TAPSE (M-mode): 1.6 cm LEFT ATRIUM             Index       RIGHT ATRIUM           Index LA diam:        4.20 cm 2.13 cm/m  RA Area:     10.60 cm LA Vol (A2C):   48.5 ml 24.55 ml/m RA Volume:   21.60 ml  10.93 ml/m LA Vol (A4C):   57.3 ml 29.00 ml/m LA Biplane Vol: 54.1 ml 27.38 ml/m  AORTIC VALVE LVOT Vmax:   84.20 cm/s LVOT Vmean:  57.000 cm/s LVOT VTI:    0.184 m  AORTA Ao Root diam: 2.90 cm MITRAL VALVE MV Area (PHT): 3.85 cm     SHUNTS MV Decel Time: 197 msec     Systemic VTI:  0.18 m MV E velocity: 112.00 cm/s  Systemic Diam: 2.00 cm MV A velocity: 30.00 cm/s MV  E/A ratio:  3.73 Fransico Him MD Electronically signed by Fransico Him MD Signature Date/Time: 05/23/2020/10:39:38 AM    Final     Cardiac Studies   Echo 05/23/2020 IMPRESSIONS    1. Left ventricular ejection fraction, by estimation, is 25 to 30%. The  left ventricle has severely decreased function. The left ventricle  demonstrates regional wall motion abnormalities (see scoring  diagram/findings for description). Left ventricular  diastolic parameters are indeterminate. Elevated left ventricular  end-diastolic pressure. There is akinesis of the left ventricular,  basal-mid inferoseptal wall and anteroseptal wall. There is akinesis of  the left ventricular, apical septal wall. There  is akinesis of the left ventricular, entire inferior wall. There is severe  hypokinesis of the left ventricular, basal-mid inferolateral wall.  2. Right ventricular systolic function is normal. The right ventricular  size is normal. Tricuspid regurgitation signal is inadequate for assessing  PA pressure.  3. The mitral valve is degenerative. Mild mitral valve regurgitation. No  evidence of mitral stenosis.  4. The aortic valve has  an indeterminant number of cusps. Aortic valve  regurgitation is not visualized. No aortic stenosis is present.  5. The inferior vena cava is normal in size with greater than 50%  respiratory variability, suggesting right atrial pressure of 3 mmHg.   Patient Profile     60 y.o. male with PMH of HTN and DM II but off meds for some time presented with new CHF. Patient was seen earlier this month for abdominal pain and N/V. He was tachycardic and hypertensive at the time. He presented to ED again on 05/22/2020 with increasing dyspnea. Noted to have low EF with wall motion abnormality. Hospital course complicated by wide complex tachycardia felt to be SVT with aberrancy  Assessment & Plan    1. Acute systolic heart failure  - Echo 05/23/2020 with EF 25-30%, akinesis of LV  basal-mid inferoseptal wall and apical septal wall, hypokinesis of LV basal mid inferolateral wall, mild MR  - continue metoprolol tartrate and farxiga, will convert metoprolol tartrate to metoprolol succinate prior to D/C. BP soft, unable to add ACEI or ARB  - will discuss with MD to consider L&RHC. Denies any recent chest pain, however significant wall motion abnormality seen on echo. Made NPO  - will attempt at ARB once he is off nitro patch  - euvolemic on exam, Cr trending up slightly, will switch IV lasix to 40mg  daily of PO lasix and add low dose spironolactone.   2. Bilateral pleural effusion  - although CTA of chest suggested typical COVID pneumonia with severe perihilar airspace disease, his COVID test was negative  - appears to have resolved on exam  3. SVT with aberrancy: no recurrence on metoprolol  4. Wall motion abnormality on Echo: see #1  5. HTN  6. DM II: started on metformin earlier this morning, added farxiga given CHF  7. Elevated d-dimer: CTA negative for PE      For questions or updates, please contact [place] Please consult [email or web address] for contact info under        Signed, Almyra Deforest, [place]  05/25/2020, 8:20 AM    I have personally seen and examined this patient. I agree with the assessment and plan as outlined above.  Agree with current medication regimen.  He will need a right and left heart cath to assess pressures and exclude CAD. We will make him NPO now and plan cardiac cath later today.   Lauree Chandler 05/25/2020 9:36 AM

## 2020-05-25 NOTE — Progress Notes (Addendum)
Progress Note  Patient Name: Derek Thompson Date of Encounter: 05/25/2020  [facility] Cardiologist: No primary care provider on file.   Subjective   Denies any CP, breathing ok.   Inpatient Medications    Scheduled Meds: . dapagliflozin propanediol  10 mg Oral Daily  . heparin  5,000 Units Subcutaneous Q8H  . insulin aspart  0-15 Units Subcutaneous TID WC  . insulin aspart  0-5 Units Subcutaneous QHS  . metoprolol tartrate  25 mg Oral BID  . nitroGLYCERIN  1 inch Topical Q6H   Continuous Infusions: . furosemide 100 mg (05/25/20 0756)   PRN Meds: acetaminophen **OR** acetaminophen, morphine injection, ondansetron **OR** ondansetron (ZOFRAN) IV, oxyCODONE, polyethylene glycol   Vital Signs    Vitals:   05/24/20 2000 05/25/20 0004 05/25/20 0539 05/25/20 0752  BP: 122/85 109/77 116/73 106/71  Pulse: 92 82 81 85  Resp: 20 20 20 19   Temp:  98.8 F (37.1 C) 98 F (36.7 C) (!) 97.5 F (36.4 C)  TempSrc:  Oral Oral Oral  SpO2: 93% 95% 97% 100%  Weight:   72.5 kg   Height:        Intake/Output Summary (Last 24 hours) at 05/25/2020 0820 Last data filed at 05/25/2020 0753 Gross per 24 hour  Intake 900 ml  Output --  Net 900 ml   Last 3 Weights 05/25/2020 05/24/2020 05/23/2020  Weight (lbs) 159 lb 14.4 oz 166 lb 8 oz 167 lb 8 oz  Weight (kg) 72.53 kg 75.524 kg 75.978 kg      Telemetry    NSR with LBBB, no recurrent SVT- Personally Reviewed  ECG    Wide complex tachycardia on EKG 1/22 - Personally Reviewed  Physical Exam   GEN: No acute distress.   Neck: No JVD Cardiac: RRR, no murmurs, rubs, or gallops.  Respiratory: Clear to auscultation bilaterally. GI: Soft, nontender, non-distended  MS: No edema; No deformity. Neuro:  Nonfocal  Psych: Normal affect   Labs    High Sensitivity Troponin:   Recent Labs  Lab 05/22/20 1208 05/22/20 1426  TROPONINIHS 12 12      Chemistry Recent Labs  Lab 05/22/20 1208 05/23/20 0155 05/23/20 1200  05/24/20 0144 05/25/20 0145  NA 134* 135 131* 134* 133*  K 4.3 4.0 4.1 3.9 4.1  CL 99 100 97* 98 91*  CO2 25 25 24 26 31   GLUCOSE 169* 156* 195* 128* 178*  BUN 13 10 11 14 16   CREATININE 0.84 0.83 0.89 1.04 1.25*  CALCIUM 9.1 8.5* 8.4* 8.5* 8.7*  PROT 7.2 6.1* 6.0*  --   --   ALBUMIN 3.1* 2.5* 2.4*  --   --   AST 14* 13* 12*  --   --   ALT 17 16 14   --   --   ALKPHOS 34* 28* 30*  --   --   BILITOT 0.1* 0.7 0.4  --   --   GFRNONAA >60 >60 >60 >60 >60  ANIONGAP 10 10 10 10 11      Hematology Recent Labs  Lab 05/23/20 0155 05/23/20 1200 05/24/20 0144  WBC 10.1 9.3 8.9  RBC 3.86* 3.71* 4.04*  HGB 11.1* 10.9* 11.3*  HCT 34.3* 32.7* 35.8*  MCV 88.9 88.1 88.6  MCH 28.8 29.4 28.0  MCHC 32.4 33.3 31.6  RDW 12.6 12.7 12.3  PLT 291 342 342    BNP Recent Labs  Lab 05/22/20 1129  BNP 636.6*     DDimer  Recent Labs  Lab 05/22/20  [place] 0.86*     Radiology    ECHOCARDIOGRAM COMPLETE  Result Date: 05/23/2020    ECHOCARDIOGRAM REPORT   Patient Name:   Derek Thompson Date of Exam: 05/23/2020 Medical Rec #:  [id number]     Height:       72.0 in Accession #:    [id number]    Weight:       167.5 lb Date of Birth:  1960-07-10     BSA:          1.976 m Patient Age:    60 years      BP:           131/92 mmHg Patient Gender: M             HR:           100 bpm. Exam Location:  Inpatient Procedure: 2D Echo Indications:    Congestive Heart Failure I50.9  History:        Patient has no prior history of Echocardiogram examinations.                 Risk Factors:Hypertension and Diabetes.  Sonographer:    Mikki Santee RDCS (AE) Referring Phys: [id number] ASIA B [place]  1. Left ventricular ejection fraction, by estimation, is 25 to 30%. The left ventricle has severely decreased function. The left ventricle demonstrates regional wall motion abnormalities (see scoring diagram/findings for description). Left ventricular diastolic parameters are indeterminate. Elevated left  ventricular end-diastolic pressure. There is akinesis of the left ventricular, basal-mid inferoseptal wall and anteroseptal wall. There is akinesis of the left ventricular, apical septal wall. There is akinesis of the left ventricular, entire inferior wall. There is severe hypokinesis of the left ventricular, basal-mid inferolateral wall.  2. Right ventricular systolic function is normal. The right ventricular size is normal. Tricuspid regurgitation signal is inadequate for assessing PA pressure.  3. The mitral valve is degenerative. Mild mitral valve regurgitation. No evidence of mitral stenosis.  4. The aortic valve has an indeterminant number of cusps. Aortic valve regurgitation is not visualized. No aortic stenosis is present.  5. The inferior vena cava is normal in size with greater than 50% respiratory variability, suggesting right atrial pressure of 3 mmHg. FINDINGS  Left Ventricle: Left ventricular ejection fraction, by estimation, is 25 to 30%. The left ventricle has severely decreased function. The left ventricle demonstrates regional wall motion abnormalities. Severe hypokinesis of the left ventricular, basal-mid inferolateral wall. The left ventricular internal cavity size was normal in size. There is no left ventricular hypertrophy. Abnormal (paradoxical) septal motion, consistent with left bundle branch block. Left ventricular diastolic parameters are indeterminate. Elevated left ventricular end-diastolic pressure. Right Ventricle: The right ventricular size is normal. No increase in right ventricular wall thickness. Right ventricular systolic function is normal. Tricuspid regurgitation signal is inadequate for assessing PA pressure. Left Atrium: Left atrial size was normal in size. Right Atrium: Right atrial size was normal in size. Pericardium: There is no evidence of pericardial effusion. Mitral Valve: The mitral valve is degenerative in appearance. There is mild calcification of the anterior mitral  valve leaflet(s). Mild mitral valve regurgitation. No evidence of mitral valve stenosis. Tricuspid Valve: The tricuspid valve is normal in structure. Tricuspid valve regurgitation is not demonstrated. No evidence of tricuspid stenosis. Aortic Valve: The aortic valve has an indeterminant number of cusps. Aortic valve regurgitation is not visualized. No aortic stenosis is present. Pulmonic Valve: The pulmonic valve was normal in structure. Pulmonic valve regurgitation  is not visualized. No evidence of pulmonic stenosis. Aorta: The aortic root is normal in size and structure. Venous: The inferior vena cava is normal in size with greater than 50% respiratory variability, suggesting right atrial pressure of 3 mmHg. IAS/Shunts: No atrial level shunt detected by color flow Doppler.  LEFT VENTRICLE PLAX 2D LVIDd:         5.20 cm      Diastology LVIDs:         4.40 cm      LV e' medial:    7.29 cm/s LV PW:         1.10 cm      LV E/e' medial:  15.4 LV IVS:        0.90 cm      LV e' lateral:   10.60 cm/s LVOT diam:     2.00 cm      LV E/e' lateral: 10.6 LV SV:         58 LV SV Index:   29 LVOT Area:     3.14 cm  LV Volumes (MOD) LV vol d, MOD A2C: 132.0 ml LV vol d, MOD A4C: 136.0 ml LV vol s, MOD A2C: 80.8 ml LV vol s, MOD A4C: 91.6 ml LV SV MOD A2C:     51.2 ml LV SV MOD A4C:     136.0 ml LV SV MOD BP:      45.1 ml RIGHT VENTRICLE RV S prime:     9.79 cm/s TAPSE (M-mode): 1.6 cm LEFT ATRIUM             Index       RIGHT ATRIUM           Index LA diam:        4.20 cm 2.13 cm/m  RA Area:     10.60 cm LA Vol (A2C):   48.5 ml 24.55 ml/m RA Volume:   21.60 ml  10.93 ml/m LA Vol (A4C):   57.3 ml 29.00 ml/m LA Biplane Vol: 54.1 ml 27.38 ml/m  AORTIC VALVE LVOT Vmax:   84.20 cm/s LVOT Vmean:  57.000 cm/s LVOT VTI:    0.184 m  AORTA Ao Root diam: 2.90 cm MITRAL VALVE MV Area (PHT): 3.85 cm     SHUNTS MV Decel Time: 197 msec     Systemic VTI:  0.18 m MV E velocity: 112.00 cm/s  Systemic Diam: 2.00 cm MV A velocity: 30.00 cm/s MV  E/A ratio:  3.73 Traci Turner MD Electronically signed by Traci Turner MD Signature Date/Time: 05/23/2020/10:39:38 AM    Final     Cardiac Studies   Echo 05/23/2020 IMPRESSIONS    1. Left ventricular ejection fraction, by estimation, is 25 to 30%. The  left ventricle has severely decreased function. The left ventricle  demonstrates regional wall motion abnormalities (see scoring  diagram/findings for description). Left ventricular  diastolic parameters are indeterminate. Elevated left ventricular  end-diastolic pressure. There is akinesis of the left ventricular,  basal-mid inferoseptal wall and anteroseptal wall. There is akinesis of  the left ventricular, apical septal wall. There  is akinesis of the left ventricular, entire inferior wall. There is severe  hypokinesis of the left ventricular, basal-mid inferolateral wall.  2. Right ventricular systolic function is normal. The right ventricular  size is normal. Tricuspid regurgitation signal is inadequate for assessing  PA pressure.  3. The mitral valve is degenerative. Mild mitral valve regurgitation. No  evidence of mitral stenosis.  4. The aortic valve has   an indeterminant number of cusps. Aortic valve  regurgitation is not visualized. No aortic stenosis is present.  5. The inferior vena cava is normal in size with greater than 50%  respiratory variability, suggesting right atrial pressure of 3 mmHg.   Patient Profile     60 y.o. male with PMH of HTN and DM II but off meds for some time presented with new CHF. Patient was seen earlier this month for abdominal pain and N/V. He was tachycardic and hypertensive at the time. He presented to ED again on 05/22/2020 with increasing dyspnea. Noted to have low EF with wall motion abnormality. Hospital course complicated by wide complex tachycardia felt to be SVT with aberrancy  Assessment & Plan    1. Acute systolic heart failure  - Echo 05/23/2020 with EF 25-30%, akinesis of LV  basal-mid inferoseptal wall and apical septal wall, hypokinesis of LV basal mid inferolateral wall, mild MR  - continue metoprolol tartrate and farxiga, will convert metoprolol tartrate to metoprolol succinate prior to D/C. BP soft, unable to add ACEI or ARB  - will discuss with MD to consider L&RHC. Denies any recent chest pain, however significant wall motion abnormality seen on echo. Made NPO  - will attempt at ARB once he is off nitro patch  - euvolemic on exam, Cr trending up slightly, will switch IV lasix to 40mg  daily of PO lasix and add low dose spironolactone.   2. Bilateral pleural effusion  - although CTA of chest suggested typical COVID pneumonia with severe perihilar airspace disease, his COVID test was negative  - appears to have resolved on exam  3. SVT with aberrancy: no recurrence on metoprolol  4. Wall motion abnormality on Echo: see #1  5. HTN  6. DM II: started on metformin earlier this morning, added farxiga given CHF  7. Elevated d-dimer: CTA negative for PE      For questions or updates, please contact [place] Please consult [email or web address] for contact info under        Signed, Almyra Deforest, [place]  05/25/2020, 8:20 AM    I have personally seen and examined this patient. I agree with the assessment and plan as outlined above.  Agree with current medication regimen.  He will need a right and left heart cath to assess pressures and exclude CAD. We will make him NPO now and plan cardiac cath later today.   Lauree Chandler 05/25/2020 9:36 AM

## 2020-05-25 NOTE — Progress Notes (Signed)
PT Cancellation Note  Patient Details Name: Zhi Geier MRN: [id number] DOB: [birth year]   Cancelled Treatment:    Reason Eval/Treat Not Completed: PT screened, no needs identified, will sign off - pt has been mobilizing great hallway distances with mobility technician Iustin, per Iustin pt is doing well with mobility. PT to sign off, please reconsult if needed.  Stacie Glaze, PT Acute Rehabilitation Services Pager [phone]  Office [phone]    Louis Matte 05/25/2020, 3:26 PM

## 2020-05-25 NOTE — Progress Notes (Signed)
Removed TR band from patients right radial entry site. No bleeding noted. Patient denies pain.No hematoma. Gauze and tegaderm applied. Dressing clean dry and intact.mec Fuller [place], RN

## 2020-05-26 ENCOUNTER — Other Ambulatory Visit (HOSPITAL_COMMUNITY): Payer: Self-pay | Admitting: Internal Medicine

## 2020-05-26 DIAGNOSIS — E1159 Type 2 diabetes mellitus with other circulatory complications: Secondary | ICD-10-CM

## 2020-05-26 DIAGNOSIS — E1165 Type 2 diabetes mellitus with hyperglycemia: Secondary | ICD-10-CM

## 2020-05-26 DIAGNOSIS — I428 Other cardiomyopathies: Secondary | ICD-10-CM

## 2020-05-26 DIAGNOSIS — E1169 Type 2 diabetes mellitus with other specified complication: Secondary | ICD-10-CM

## 2020-05-26 DIAGNOSIS — I152 Hypertension secondary to endocrine disorders: Secondary | ICD-10-CM

## 2020-05-26 DIAGNOSIS — E785 Hyperlipidemia, unspecified: Secondary | ICD-10-CM

## 2020-05-26 DIAGNOSIS — I2510 Atherosclerotic heart disease of native coronary artery without angina pectoris: Secondary | ICD-10-CM

## 2020-05-26 LAB — POCT I-STAT 7, (LYTES, BLD GAS, ICA,H+H)
Acid-Base Excess: 8 mmol/L — ABNORMAL HIGH (ref 0.0–2.0)
Bicarbonate: 31.7 mmol/L — ABNORMAL HIGH (ref 20.0–28.0)
Calcium, Ion: 1.13 mmol/L — ABNORMAL LOW (ref 1.15–1.40)
HCT: 36 % — ABNORMAL LOW (ref 39.0–52.0)
Hemoglobin: 12.2 g/dL — ABNORMAL LOW (ref 13.0–17.0)
O2 Saturation: 99 %
Potassium: 3.8 mmol/L (ref 3.5–5.1)
Sodium: 136 mmol/L (ref 135–145)
TCO2: 33 mmol/L — ABNORMAL HIGH (ref 22–32)
pCO2 arterial: 41.6 mmHg (ref 32.0–48.0)
pH, Arterial: 7.489 — ABNORMAL HIGH (ref 7.350–7.450)
pO2, Arterial: 119 mmHg — ABNORMAL HIGH (ref 83.0–108.0)

## 2020-05-26 LAB — CBC WITH DIFFERENTIAL/PLATELET
Abs Immature Granulocytes: 0.02 10*3/uL (ref 0.00–0.07)
Basophils Absolute: 0 10*3/uL (ref 0.0–0.1)
Basophils Relative: 1 %
Eosinophils Absolute: 0.1 10*3/uL (ref 0.0–0.5)
Eosinophils Relative: 2 %
HCT: 35.8 % — ABNORMAL LOW (ref 39.0–52.0)
Hemoglobin: 12.2 g/dL — ABNORMAL LOW (ref 13.0–17.0)
Immature Granulocytes: 0 %
Lymphocytes Relative: 34 %
Lymphs Abs: 2.3 10*3/uL (ref 0.7–4.0)
MCH: 29.3 pg (ref 26.0–34.0)
MCHC: 34.1 g/dL (ref 30.0–36.0)
MCV: 86.1 fL (ref 80.0–100.0)
Monocytes Absolute: 0.7 10*3/uL (ref 0.1–1.0)
Monocytes Relative: 11 %
Neutro Abs: 3.6 10*3/uL (ref 1.7–7.7)
Neutrophils Relative %: 52 %
Platelets: 360 10*3/uL (ref 150–400)
RBC: 4.16 MIL/uL — ABNORMAL LOW (ref 4.22–5.81)
RDW: 12.4 % (ref 11.5–15.5)
WBC: 6.8 10*3/uL (ref 4.0–10.5)
nRBC: 0 % (ref 0.0–0.2)

## 2020-05-26 LAB — BASIC METABOLIC PANEL
Anion gap: 11 (ref 5–15)
BUN: 21 mg/dL — ABNORMAL HIGH (ref 6–20)
CO2: 26 mmol/L (ref 22–32)
Calcium: 8.7 mg/dL — ABNORMAL LOW (ref 8.9–10.3)
Chloride: 92 mmol/L — ABNORMAL LOW (ref 98–111)
Creatinine, Ser: 1.2 mg/dL (ref 0.61–1.24)
GFR, Estimated: >60 mL/min (ref >60)
Glucose, Bld: 252 mg/dL — ABNORMAL HIGH (ref 70–99)
Potassium: 4.2 mmol/L (ref 3.5–5.1)
Sodium: 129 mmol/L — ABNORMAL LOW (ref 135–145)

## 2020-05-26 LAB — GLUCOSE, CAPILLARY
Glucose-Capillary: 212 mg/dL — ABNORMAL HIGH (ref 70–99)
Glucose-Capillary: 188 mg/dL — ABNORMAL HIGH (ref 70–99)

## 2020-05-26 MED ORDER — ATORVASTATIN CALCIUM 40 MG PO TABS: 40.0000 mg | ORAL_TABLET | Freq: Every day | ORAL | 0 refills | Status: DC

## 2020-05-26 MED ORDER — POLYETHYLENE GLYCOL 3350 17 G PO PACK: 17.0000 g | PACK | Freq: Every day | ORAL | 0 refills | Status: DC

## 2020-05-26 MED ORDER — METOPROLOL SUCCINATE ER 25 MG PO TB24
25.0000 mg | ORAL_TABLET | Freq: Every day | ORAL | Status: DC
  Administered 2020-05-26: 25 mg via ORAL
  Filled 2020-05-26: qty 1

## 2020-05-26 MED ORDER — ATORVASTATIN CALCIUM 40 MG PO TABS
40.0000 mg | ORAL_TABLET | Freq: Every day | ORAL | Status: DC
  Administered 2020-05-26: 40 mg via ORAL
  Filled 2020-05-26: qty 1

## 2020-05-26 MED ORDER — SPIRONOLACTONE 25 MG PO TABS: 12.5000 mg | ORAL_TABLET | Freq: Every day | ORAL | 0 refills | Status: DC

## 2020-05-26 MED ORDER — BLOOD GLUCOSE MONITOR KIT: PACK | 0 refills | Status: DC

## 2020-05-26 MED ORDER — GLIPIZIDE 5 MG PO TABS: 5.0000 mg | ORAL_TABLET | Freq: Two times a day (BID) | ORAL | 0 refills | Status: DC

## 2020-05-26 MED ORDER — ASPIRIN 81 MG PO TBEC: 81.0000 mg | DELAYED_RELEASE_TABLET | Freq: Every day | ORAL | 11 refills | Status: DC

## 2020-05-26 MED ORDER — DAPAGLIFLOZIN PROPANEDIOL 10 MG PO TABS: 10.0000 mg | ORAL_TABLET | Freq: Every day | ORAL | 0 refills | Status: DC

## 2020-05-26 MED ORDER — ASPIRIN EC 81 MG PO TBEC
81.0000 mg | DELAYED_RELEASE_TABLET | Freq: Every day | ORAL | Status: DC
  Administered 2020-05-26: 81 mg via ORAL
  Filled 2020-05-26: qty 1

## 2020-05-26 MED FILL — ATORVASTATIN CALCIUM 40 MG: 40 | 30 days supply | Qty: 30 | Fill #0

## 2020-05-26 MED FILL — POLYETHYLENE GLYCOL 3350 PO: 17 | 30 days supply | Qty: 510 | Fill #0

## 2020-05-26 MED FILL — ASPIRIN LOW DOSE 81 MG TBEC: 81 | 30 days supply | Qty: 30 | Fill #0

## 2020-05-26 MED FILL — SPIRONOLACTONE 25 MG TABLET: 25 | 30 days supply | Qty: 15 | Fill #0

## 2020-05-26 MED FILL — FARXIGA 10 MG TABLET: 10 | 30 days supply | Qty: 30 | Fill #0

## 2020-05-26 MED FILL — glipiZIDE 5 MG TABS: 5 | 30 days supply | Qty: 60 | Fill #0

## 2020-05-26 NOTE — Progress Notes (Signed)
Heart Failure Stewardship Pharmacist Progress Note   PCP: Patient, No Pcp Per PCP-Cardiologist: No primary care provider on file.    HPI:  60 yo M with PMH of HTN and DM. He presented to the ED with increasing dyspnea. An ECHO was done on 05/23/20 and his LVEF was 25-30%.   Current HF Medications: Metoprolol succinate 25 mg daily Spironolactone 12.5 mg daily Farxiga 10 mg daily  Prior to admission HF Medications: None  Pertinent Lab Values: . Serum creatinine 1.20, BUN 21, Potassium 4.2, Sodium 129, BNP 636.6, Magnesium 2.1   Vital Signs: . Weight: 160 lbs (admission weight: 167 lbs) . Blood pressure: 100/60s  . Heart rate: 80s   Medication Assistance / Insurance Benefits Check: Does the patient have prescription insurance?  Yes Type of insurance plan: [employer]  Outpatient Pharmacy:  Prior to admission outpatient pharmacy: [facility] Is the patient willing to use [facility] at discharge? Yes Is the patient willing to transition their outpatient pharmacy to utilize a [facility] outpatient pharmacy?   Pending    Assessment: 1. 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 NYHA class II symptoms. - Continue metoprolol succinate 25 mg daily - BP too soft to add ARB/Entresto at this time - Agree with starting spironolactone 12.5 mg daily - Continue Farxiga 10 mg daily   Plan: 1) Medication changes recommended at this time: - Agree with starting spironolactone as above  2) Patient assistance: - Was successful in obtaining a copay card for [place].  This copay card will make the patients copay $0.  3)  Education  - To be completed prior to discharge  Kerby Nora, PharmD, BCPS Heart Failure Stewardship Pharmacist Phone [phone]

## 2020-05-26 NOTE — Progress Notes (Addendum)
Progress Note  Patient Name: Derek Thompson Date of Encounter: 05/26/2020  [facility] Cardiologist: No primary care provider on file. [place]  Subjective   Feeling well this morning. No complaints  Inpatient Medications    Scheduled Meds: . dapagliflozin propanediol  10 mg Oral Daily  . enoxaparin (LOVENOX) injection  40 mg Subcutaneous Q24H  . insulin aspart  0-15 Units Subcutaneous TID WC  . insulin aspart  0-5 Units Subcutaneous QHS  . metoprolol tartrate  25 mg Oral BID  . sodium chloride flush  3 mL Intravenous Q12H  . spironolactone  12.5 mg Oral Daily   Continuous Infusions: . sodium chloride     PRN Meds: sodium chloride, acetaminophen **OR** acetaminophen, morphine injection, ondansetron **OR** ondansetron (ZOFRAN) IV, oxyCODONE, polyethylene glycol, sodium chloride flush   Vital Signs    Vitals:   05/26/20 0300 05/26/20 0320 05/26/20 0332 05/26/20 0559  BP:  96/66 96/66   Pulse:  78 78   Resp: 16 14 14    Temp:  97.7 F (36.5 C) 97.7 F (36.5 C)   TempSrc:  Oral    SpO2:  97%    Weight:    73 kg  Height:        Intake/Output Summary (Last 24 hours) at 05/26/2020 0924 Last data filed at 05/26/2020 0558 Gross per 24 hour  Intake 593 ml  Output 1175 ml  Net -582 ml   Last 3 Weights 05/26/2020 05/25/2020 05/24/2020  Weight (lbs) 160 lb 14.4 oz 159 lb 14.4 oz 166 lb 8 oz  Weight (kg) 72.984 kg 72.53 kg 75.524 kg      Telemetry    SR with LBBB - Personally Reviewed  ECG    No new tracing.  Physical Exam   GEN: No acute distress.   Neck: No JVD Cardiac: RRR, no murmurs, rubs, or gallops.  Respiratory: Clear to auscultation bilaterally. GI: Soft, nontender, non-distended  MS: No edema; No deformity. Right radial cath site stable.  Neuro:  Nonfocal  Psych: Normal affect   Labs    High Sensitivity Troponin:   Recent Labs  Lab 05/22/20 1208 05/22/20 1426  TROPONINIHS 12 12      Chemistry Recent Labs  Lab 05/22/20 1208 05/23/20 0155  05/23/20 1200 05/24/20 0144 05/25/20 0145 05/26/20 0258  NA 134* 135 131* 134* 133* 129*  K 4.3 4.0 4.1 3.9 4.1 4.2  CL 99 100 97* 98 91* 92*  CO2 25 25 24 26 31 26   GLUCOSE 169* 156* 195* 128* 178* 252*  BUN 13 10 11 14 16  21*  CREATININE 0.84 0.83 0.89 1.04 1.25* 1.20  CALCIUM 9.1 8.5* 8.4* 8.5* 8.7* 8.7*  PROT 7.2 6.1* 6.0*  --   --   --   ALBUMIN 3.1* 2.5* 2.4*  --   --   --   AST 14* 13* 12*  --   --   --   ALT 17 16 14   --   --   --   ALKPHOS 34* 28* 30*  --   --   --   BILITOT 0.1* 0.7 0.4  --   --   --   GFRNONAA >60 >60 >60 >60 >60 >60  ANIONGAP 10 10 10 10 11 11      Hematology Recent Labs  Lab 05/23/20 1200 05/24/20 0144 05/26/20 0258  WBC 9.3 8.9 6.8  RBC 3.71* 4.04* 4.16*  HGB 10.9* 11.3* 12.2*  HCT 32.7* 35.8* 35.8*  MCV 88.1 88.6 86.1  MCH 29.4  28.0 29.3  MCHC 33.3 31.6 34.1  RDW 12.7 12.3 12.4  PLT 342 342 360    BNP Recent Labs  Lab 05/22/20 1129  BNP 636.6*     DDimer  Recent Labs  Lab 05/22/20 1208  DDIMER 0.86*     Radiology    CARDIAC CATHETERIZATION  Result Date: 05/25/2020  Prox RCA to Mid RCA lesion is 60% stenosed.  Dist RCA lesion is 50% stenosed.  Ost LAD to Mid LAD lesion is 35% stenosed.  1st Diag lesion is 30% stenosed.  Dist LAD lesion is 50% stenosed.  Ramus lesion is 80% stenosed.  Ost Cx to Prox Cx lesion is 50% stenosed.  LV end diastolic pressure is normal.  1. Single vessel obstructive CAD involving a ramus intermediate branch 2. Normal right heart pressures. PAP mean 11 mm Hg 3. Low LV filling pressures. PCWP 5 mm Hg. 4. Preserved cardiac output with index 2.79. Plan: LV dysfunction is out of proportion to degree of CAD. Recommend  medical therapy. Optimize CHF therapy. Appears to be volume depleted currently.    Cardiac Studies   Echo: 05/23/20  IMPRESSIONS    1. Left ventricular ejection fraction, by estimation, is 25 to 30%. The  left ventricle has severely decreased function. The left ventricle   demonstrates regional wall motion abnormalities (see scoring  diagram/findings for description). Left ventricular  diastolic parameters are indeterminate. Elevated left ventricular  end-diastolic pressure. There is akinesis of the left ventricular,  basal-mid inferoseptal wall and anteroseptal wall. There is akinesis of  the left ventricular, apical septal wall. There  is akinesis of the left ventricular, entire inferior wall. There is severe  hypokinesis of the left ventricular, basal-mid inferolateral wall.  2. Right ventricular systolic function is normal. The right ventricular  size is normal. Tricuspid regurgitation signal is inadequate for assessing  PA pressure.  3. The mitral valve is degenerative. Mild mitral valve regurgitation. No  evidence of mitral stenosis.  4. The aortic valve has an indeterminant number of cusps. Aortic valve  regurgitation is not visualized. No aortic stenosis is present.  5. The inferior vena cava is normal in size with greater than 50%  respiratory variability, suggesting right atrial pressure of 3 mmHg.   Cath: 05/25/20   Prox RCA to Mid RCA lesion is 60% stenosed.  Dist RCA lesion is 50% stenosed.  Ost LAD to Mid LAD lesion is 35% stenosed.  1st Diag lesion is 30% stenosed.  Dist LAD lesion is 50% stenosed.  Ramus lesion is 80% stenosed.  Ost Cx to Prox Cx lesion is 50% stenosed.  LV end diastolic pressure is normal.   1. Single vessel obstructive CAD involving a ramus intermediate branch 2. Normal right heart pressures. PAP mean 11 mm Hg 3. Low LV filling pressures. PCWP 5 mm Hg.  4. Preserved cardiac output with index 2.79.   Plan: LV dysfunction is out of proportion to degree of CAD. Recommend  medical therapy. Optimize CHF therapy. Appears to be volume depleted currently.  Diagnostic Dominance: Right     Patient Profile     60 y.o. male PMH of HTN and DM II but off meds for some time presented with new CHF. Patient  was seen earlier this month for abdominal pain and N/V. He was tachycardic and hypertensive at the time. He presented to ED again on 05/22/2020 with increasing dyspnea. Noted to have low EF with wall motion abnormality. Hospital course complicated by wide complex tachycardia felt to be SVT  with aberrancy.   Assessment & Plan    1. Acute systolic heart failure: Echo 05/23/2020 with EF 25-30%, akinesis of LV basal-mid inferoseptal wall and apical septal wall, hypokinesis of LV basal mid inferolateral wall, mild MR. Cath yesterday with mild to moderate nonobstructive disease. Recommendation for medical therapy.  -- will switch to Toprol XL 25mg  daily and continue [place]. Unable to add ARB/Entresto given soft blood pressures. To receive first dose of spiro today. -- remains euvolemic on exam, Cr stable at 1.20 today.   2. Bilateral pleural effusion: although CTA of chest suggested typical COVID pneumonia with severe perihilar airspace disease, his COVID test was negative. Now resolved with diuresis. Net - 4.4L  3. SVT with aberrancy: no recurrence on metoprolol  4. HTN: blood pressures have been soft. Will continue with current regimen.   5. DM II: started on metformin earlier this morning, added farxiga this admission given CHF  6. Syncope: does have 2 reported episodes prior to admission. Will review with MD regarding monitoring as an outpatient, Life[employer] Vs cardiac monitor?  7. HLD: LDL 97 with nonobstructive coronary disease on cath.  -- Will add atorvastatin 40mg  daily  For questions or updates, please contact [place] Please consult [email or web address] for contact info under   Signed, Reino Bellis, NP  05/26/2020, 9:24 AM    I have personally seen and examined this patient. I agree with the assessment and plan as outlined above.  He is doing well. Non-obstructive CAD on cath. Filling pressures low. Will plan to continue beta blocker and spironolactone. Unable to add Entresto or  ARB/Ace-inh due to soft BP. Add daily ASA and statin. We will arrange an outpatient event monitor given his syncope prior to admission and SVT noted during admission. OK to discharge home today from cardiac perspective. We will arrange f/u with Dr. Debara Pickett.   Lauree Chandler 05/26/2020 10:19 AM

## 2020-05-26 NOTE — Discharge Summary (Signed)
Physician Discharge Summary  Derek Thompson [id number] DOB: [birth year] DOA: 05/22/2020  PCP: Patient, No Pcp Per  Admit date: 05/22/2020 Discharge date: 05/26/2020  Recommendations for Outpatient Follow-up:  1. Discharge to home 2. Follow up with cardiology as directed regarding event monitor. 3. Follow up with Dr. Debara Pickett as directed by cardiology. 4. No driving until cleared by cardiology 5. Follow up with PCP in 7-10 days. Chemistry to be checked at that visit. 6. Check glucoses twice daily. Once in the morning before breakfast and once in the evening before dinner. Write these down and take in to visit with PCP. 7. Weigh yourself daily. If you gain more than 3 lbs in one day or more than 5 lbs in 1 week call your doctor.  Discharge Diagnoses: Principal diagnosis is #1 1. acute systolic heart failure 2. Non-obstructive CAD - medical management 3. Syncope 4. DM II - poor control with associated CAD/hypertension/hyperlipidemia  Discharge Condition: Fair  Disposition: Home  Diet recommendation: Heart healthy/modified carbohydrates.  Filed Weights   05/24/20 0254 05/25/20 0539 05/26/20 0559  Weight: 75.5 kg 72.5 kg 73 kg    History of present illness: Derek Thompson  is a 60 y.o. male, with history of diabetes mellitus type 2, presents to the ED with a chief complaint of dyspnea.  Patient has had increasing shortness of breath for 1 week.  The shortness of breath has been worse with exertion, better with rest, and progressively worsening.  Patient reports orthopnea claiming that he wakes up in the night coughing if he lays flat and he has been having to sleep sitting up.  Patient does not admit to any recent weight gain, or peripheral edema.  Patient denies any chest pain, palpitations.  He does report that he recently had some indigestion, nausea, vomiting for which he was seen in the ER 2 weeks ago.  At that time he was diagnosed with diabetes and started on metformin.  Patient  reports that he had also been told that he was diabetic about 20 years ago, but he lost weight and his glucose was diet controlled.  Patient denies any fevers, sick contacts.  He does report that he had an episode of dyspnea and coughing in December when he was mixing cleaning products together and the gas reaction caused him to become very short of breath and coughing.  It sounds like that head started to resolve when his new shortness of breath/cough/orthopnea started again.  Patient reports that he has had 1 syncopal episode and one near syncopal episode.  One was while walking from the parking lot to the building.  He felt like he was going to pass out, had to sit down and take a deep breath and then felt better.  The second was getting out of his truck to pump gas and he passed out next to the gas pump.  He reports he was only out for a few seconds and a bystander said that they thought he just slipped and fell, but he reports he had passed out.  Patient is a former smoker he quit 3 years ago.  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  He does not use illicit drugs.  Patient is vaccinated for COVID with booster.  Patient is full code.  In the ED Patient has been tachycardic, blood pressures have been stable, his oxygen saturation dropped down to 87% and normalized with 2 L nasal cannula but he was then put on BiPAP for comfort.  He  was given 40 mg of Lasix.  Upon arrival to [facility] he was weaned off BiPAP and feels comfortable on the 5 L nasal cannula. CT angio as discussed below showed pulmonary edema and pleural effusions. Cardiology was consulted from the ED, they have seen patient at bedside already and plan for echo in the a.m.  Hospital Course: [facility] were consulted to admit the patient for further evaluation and treatment.  60 year old gentleman with history significant for type 2 diabetes who presented to progressive dyspnea for 1 week. Symptoms were most  consistent with heart failure including orthopnea. In addition to dyspnea the patient had had a syncopal episode for the last week with his symptoms. 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 In the ER the patient was noted to be hypoxic and placed on 2 L nasal cannulathen BiPAP for comfort. CT angio was obtained which did not show any pulmonary embolism but did show pulmonary edema and effusions. Cardiology was consulted.  HbA1c was checked and was found to be 10.1. He was placed on a sliding scale. Wilder Glade was added to his diabetic regimen to help with hyperglycemia as well as to lower the patient's cardiovascular risk associated with diabetes.   1/22-echocardiogram returneddemonstrating a marked reduction in ejection fraction of 25%. With akinesis and hypokinesis throughout, supraventricular tachycardia on monitor.  The patient underwent LHC on 05/25/2020. It demonstrated:  Prox RCA to Mid RCA lesion is 60% stenosed.  Dist RCA lesion is 50% stenosed.  Ost LAD to Mid LAD lesion is 35% stenosed.  1st Diag lesion is 30% stenosed.  Dist LAD lesion is 50% stenosed.  Ramus lesion is 80% stenosed.  Ost Cx to Prox Cx lesion is 50% stenosed.  LV end diastolic pressure is normal.  Recommendation was for medical management only. Cardiology will set the patient up for event monitor. An appointment will be made to follow up with Dr. Debara Pickett as outpatient.   The patient will be discharged to home today with glucometer, and instructions to check glucoses twice daily. He will also be weighing himself daily.  Today's assessment: S: Fair O: Vitals:  Vitals:   05/26/20 0933 05/26/20 1200  BP: 108/64 112/60  Pulse: 86 92  Resp:    Temp: 98.6 F (37 C) 98.9 F (37.2 C)  SpO2: 94% 98%   Exam:  Constitutional:   The patient is awake, alert, and oriented x 3. No acute distress. Respiratory:   No increased work of breathing.  No wheezes,  rales, or rhonchi  No tactile fremitus Cardiovascular:   Regular rate and rhythm  No murmurs, ectopy, or gallups.  No lateral PMI. No thrills. Abdomen:   Abdomen is soft, non-tender, non-distended  No hernias, masses, or organomegaly  Normoactive bowel sounds.  Musculoskeletal:   No cyanosis, clubbing, or edema Skin:   No rashes, lesions, ulcers  palpation of skin: no induration or nodules Neurologic:   CN 2-12 intact  Sensation all 4 extremities intact Psychiatric:   Mental status ? Mood, affect appropriate ? Orientation to person, place, time   judgment and insight appear intact  Discharge Instructions  Discharge Instructions    (HEART FAILURE PATIENTS) Call MD:  Anytime you have any of the following symptoms: 1) 3 pound weight gain in 24 hours or 5 pounds in 1 week 2) shortness of breath, with or without a dry hacking cough 3) swelling in the hands, feet or stomach 4) if you have to sleep on extra  pillows at night in order to breathe.   Complete by: As directed    Activity as tolerated - No restrictions   Complete by: As directed    Call MD for:   Complete by: As directed    Further syncope   Call MD for:  difficulty breathing, headache or visual disturbances   Complete by: As directed    Call MD for:  extreme fatigue   Complete by: As directed    Call MD for:  persistant dizziness or light-headedness   Complete by: As directed    Diet - low sodium heart healthy   Complete by: As directed    Diet Carb Modified   Complete by: As directed    Discharge instructions   Complete by: As directed    Discharge to home Follow up with cardiology as directed regarding event monitor. Follow up with Dr. Debara Pickett as directed by cardiology. No driving until cleared by cardiology Follow up with PCP in 7-10 days. Chemistry to be checked at that visit. Check glucoses twice daily. Once in the morning before breakfast and once in the evening before dinner. Write these down  and take in to visit with PCP. Weigh yourself daily. If you gain more than 3 lbs in one day or more than 5 lbs in 1 week call your doctor.   Increase activity slowly   Complete by: As directed      Allergies as of 05/26/2020   No Known Allergies     Medication List    STOP taking these medications   OVER THE COUNTER MEDICATION   OVER THE COUNTER MEDICATION     TAKE these medications   aspirin 81 MG EC tablet Take 1 tablet (81 mg total) by mouth daily. Swallow whole. Start taking on: May 27, 2020 What changed:   medication strength  how much to take  when to take this  additional instructions   atorvastatin 40 MG tablet Commonly known as: LIPITOR Take 1 tablet (40 mg total) by mouth daily. Start taking on: May 27, 2020   dapagliflozin propanediol 10 MG Tabs tablet Commonly known as: FARXIGA Take 1 tablet (10 mg total) by mouth daily. Start taking on: May 27, 2020   FLAX SEED OIL PO Take 1 capsule by mouth daily.   glipiZIDE 5 MG tablet Commonly known as: GLUCOTROL Take 1 tablet (5 mg total) by mouth 2 (two) times daily.   metFORMIN 500 MG tablet Commonly known as: GLUCOPHAGE Take 1 tablet (500 mg total) by mouth 2 (two) times daily with a meal.   ondansetron 8 MG disintegrating tablet Commonly known as: Zofran ODT Take 1 tablet (8 mg total) by mouth every 8 (eight) hours as needed for nausea or vomiting.   polyethylene glycol 17 g packet Commonly known as: MIRALAX / GLYCOLAX Take 17 g by mouth daily.   spironolactone 25 MG tablet Commonly known as: ALDACTONE Take 0.5 tablets (12.5 mg total) by mouth daily. Start taking on: May 27, 2020   Vitamin B Complex Tabs Take 1 tablet by mouth daily.   VITAMIN C PO Take 1 tablet by mouth daily.   VITAMIN E PO Take 1 capsule by mouth daily.      No Known Allergies  The results of significant diagnostics from this hospitalization (including imaging, microbiology, ancillary and  laboratory) are listed below for reference.    Significant Diagnostic Studies: DG Chest 2 View  Result Date: 05/23/2020 CLINICAL DATA:  Acute diastolic CHF.  Shortness of  breath EXAM: CHEST - 2 VIEW COMPARISON:  Chest CT from yesterday FINDINGS: Perihilar infiltrates and layering pleural effusions better seen on prior chest CT. Similar degree of generalized interstitial coarsening. Calcified subcarinal lymph node. No visible pneumothorax. Stable heart size. IMPRESSION: Unchanged airspace disease. Pleural effusions which were layering and largely occult by radiography. Electronically Signed   By: Monte Fantasia M.D.   On: 05/23/2020 05:56   CT Angio Chest PE W/Cm &/Or Wo Cm  Result Date: 05/22/2020 CLINICAL DATA:  NEG COVID test today  D Dimer 0.86  Per chart: Pt reports shortness of breath x1 week. Pt states around christmas he mixed chemicals while cleaning. Chemical exposure EXAM: CT ANGIOGRAPHY CHEST WITH CONTRAST TECHNIQUE: Multidetector CT imaging of the chest was performed using the standard protocol during bolus administration of intravenous contrast. Multiplanar CT image reconstructions and MIPs were obtained to evaluate the vascular anatomy. CONTRAST:  180mL OMNIPAQUE IOHEXOL 350 MG/ML SOLN COMPARISON:  None. FINDINGS: Cardiovascular: No filling defects within the pulmonary arteries to suggest acute pulmonary embolism. No pericardial effusion. Coronary artery calcification and aortic atherosclerotic calcification. Mediastinum/Nodes: There are calcified mediastinal lymph nodes. No lymphadenopathy Borderline enlarged paratracheal lymph nodes. Lungs/Pleura: Diffuse perihilar airspace disease with confluent ground-glass densities. Large bilateral layering pleural effusions. Upper Abdomen: Limited view of the liver, kidneys, pancreas are unremarkable. Normal adrenal glands. Musculoskeletal: No aggressive osseous lesion. Review of the MIP images confirms the above findings. IMPRESSION: 1. No evidence  acute pulmonary embolism. 2. Diffuse severe perihilar airspace disease. Bilateral large pleural effusion. Typically COVID pneumonia does not have associated pleural effusions. Additionally COVID pneumonia is typically peripherally distributed. This central airspace disease is more favored flash pulmonary edema, inhalation toxicity (given history), or atypical pneumonia. 3. Calcified mediastinal lymph nodes consistent with prior granulomatous disease. 4. Aortic Atherosclerosis (ICD10-I70.0). Electronically Signed   By: Suzy Bouchard M.D.   On: 05/22/2020 14:50   CARDIAC CATHETERIZATION  Result Date: 05/25/2020  Prox RCA to Mid RCA lesion is 60% stenosed.  Dist RCA lesion is 50% stenosed.  Ost LAD to Mid LAD lesion is 35% stenosed.  1st Diag lesion is 30% stenosed.  Dist LAD lesion is 50% stenosed.  Ramus lesion is 80% stenosed.  Ost Cx to Prox Cx lesion is 50% stenosed.  LV end diastolic pressure is normal.  1. Single vessel obstructive CAD involving a ramus intermediate branch 2. Normal right heart pressures. PAP mean 11 mm Hg 3. Low LV filling pressures. PCWP 5 mm Hg. 4. Preserved cardiac output with index 2.79. Plan: LV dysfunction is out of proportion to degree of CAD. Recommend  medical therapy. Optimize CHF therapy. Appears to be volume depleted currently.   ECHOCARDIOGRAM COMPLETE  Result Date: 05/23/2020    ECHOCARDIOGRAM REPORT   Patient Name:   Derek Thompson Date of Exam: 05/23/2020 Medical Rec #:  [id number]     Height:       72.0 in Accession #:    [id number]    Weight:       167.5 lb Date of Birth:  March 09, 1961     BSA:          1.976 m Patient Age:    65 years      BP:           131/92 mmHg Patient Gender: M             HR:           100 bpm. Exam Location:  Inpatient Procedure: 2D Echo Indications:  Congestive Heart Failure I50.9  History:        Patient has no prior history of Echocardiogram examinations.                 Risk Factors:Hypertension and Diabetes.  Sonographer:    Thurman Coyer RDCS (AE) Referring Phys: [id number] ASIA B ZIERLE-GHOSH IMPRESSIONS  1. Left ventricular ejection fraction, by estimation, is 25 to 30%. The left ventricle has severely decreased function. The left ventricle demonstrates regional wall motion abnormalities (see scoring diagram/findings for description). Left ventricular diastolic parameters are indeterminate. Elevated left ventricular end-diastolic pressure. There is akinesis of the left ventricular, basal-mid inferoseptal wall and anteroseptal wall. There is akinesis of the left ventricular, apical septal wall. There is akinesis of the left ventricular, entire inferior wall. There is severe hypokinesis of the left ventricular, basal-mid inferolateral wall.  2. Right ventricular systolic function is normal. The right ventricular size is normal. Tricuspid regurgitation signal is inadequate for assessing PA pressure.  3. The mitral valve is degenerative. Mild mitral valve regurgitation. No evidence of mitral stenosis.  4. The aortic valve has an indeterminant number of cusps. Aortic valve regurgitation is not visualized. No aortic stenosis is present.  5. The inferior vena cava is normal in size with greater than 50% respiratory variability, suggesting right atrial pressure of 3 mmHg. FINDINGS  Left Ventricle: Left ventricular ejection fraction, by estimation, is 25 to 30%. The left ventricle has severely decreased function. The left ventricle demonstrates regional wall motion abnormalities. Severe hypokinesis of the left ventricular, basal-mid inferolateral wall. The left ventricular internal cavity size was normal in size. There is no left ventricular hypertrophy. Abnormal (paradoxical) septal motion, consistent with left bundle branch block. Left ventricular diastolic parameters are indeterminate. Elevated left ventricular end-diastolic pressure. Right Ventricle: The right ventricular size is normal. No increase in right ventricular wall thickness. Right  ventricular systolic function is normal. Tricuspid regurgitation signal is inadequate for assessing PA pressure. Left Atrium: Left atrial size was normal in size. Right Atrium: Right atrial size was normal in size. Pericardium: There is no evidence of pericardial effusion. Mitral Valve: The mitral valve is degenerative in appearance. There is mild calcification of the anterior mitral valve leaflet(s). Mild mitral valve regurgitation. No evidence of mitral valve stenosis. Tricuspid Valve: The tricuspid valve is normal in structure. Tricuspid valve regurgitation is not demonstrated. No evidence of tricuspid stenosis. Aortic Valve: The aortic valve has an indeterminant number of cusps. Aortic valve regurgitation is not visualized. No aortic stenosis is present. Pulmonic Valve: The pulmonic valve was normal in structure. Pulmonic valve regurgitation is not visualized. No evidence of pulmonic stenosis. Aorta: The aortic root is normal in size and structure. Venous: The inferior vena cava is normal in size with greater than 50% respiratory variability, suggesting right atrial pressure of 3 mmHg. IAS/Shunts: No atrial level shunt detected by color flow Doppler.  LEFT VENTRICLE PLAX 2D LVIDd:         5.20 cm      Diastology LVIDs:         4.40 cm      LV e' medial:    7.29 cm/s LV PW:         1.10 cm      LV E/e' medial:  15.4 LV IVS:        0.90 cm      LV e' lateral:   10.60 cm/s LVOT diam:     2.00 cm      LV E/e'  lateral: 10.6 LV SV:         58 LV SV Index:   29 LVOT Area:     3.14 cm  LV Volumes (MOD) LV vol d, MOD A2C: 132.0 ml LV vol d, MOD A4C: 136.0 ml LV vol s, MOD A2C: 80.8 ml LV vol s, MOD A4C: 91.6 ml LV SV MOD A2C:     51.2 ml LV SV MOD A4C:     136.0 ml LV SV MOD BP:      45.1 ml RIGHT VENTRICLE RV S prime:     9.79 cm/s TAPSE (M-mode): 1.6 cm LEFT ATRIUM             Index       RIGHT ATRIUM           Index LA diam:        4.20 cm 2.13 cm/m  RA Area:     10.60 cm LA Vol (A2C):   48.5 ml 24.55 ml/m RA  Volume:   21.60 ml  10.93 ml/m LA Vol (A4C):   57.3 ml 29.00 ml/m LA Biplane Vol: 54.1 ml 27.38 ml/m  AORTIC VALVE LVOT Vmax:   84.20 cm/s LVOT Vmean:  57.000 cm/s LVOT VTI:    0.184 m  AORTA Ao Root diam: 2.90 cm MITRAL VALVE MV Area (PHT): 3.85 cm     SHUNTS MV Decel Time: 197 msec     Systemic VTI:  0.18 m MV E velocity: 112.00 cm/s  Systemic Diam: 2.00 cm MV A velocity: 30.00 cm/s MV E/A ratio:  3.73 Fransico Him MD Electronically signed by Fransico Him MD Signature Date/Time: 05/23/2020/10:39:38 AM    Final     Microbiology: Recent Results (from the past 240 hour(s))  SARS Coronavirus 2 by RT PCR (hospital order, performed in [place] hospital lab) Nasopharyngeal Nasopharyngeal Swab     Status: None   Collection Time: 05/22/20 11:29 AM   Specimen: Nasopharyngeal Swab  Result Value Ref Range Status   SARS Coronavirus 2 NEGATIVE NEGATIVE Final    Comment: (NOTE) SARS-CoV-2 target nucleic acids are NOT DETECTED.  The SARS-CoV-2 RNA is generally detectable in upper and lower respiratory specimens during the acute phase of infection. The lowest concentration of SARS-CoV-2 viral copies this assay can detect is 250 copies / mL. A negative result does not preclude SARS-CoV-2 infection and should not be used as the sole basis for treatment or other patient management decisions.  A negative result may occur with improper specimen collection / handling, submission of specimen other than nasopharyngeal swab, presence of viral mutation(s) within the areas targeted by this assay, and inadequate number of viral copies (<250 copies / mL). A negative result must be combined with clinical observations, patient history, and epidemiological information.  Fact Sheet for Patients:   StrictlyIdeas.no  Fact Sheet for Healthcare Providers: BankingDealers.co.za  This test is not yet approved or  cleared by the [place] FDA and has been authorized  for detection and/or diagnosis of SARS-CoV-2 by FDA under an Emergency Use Authorization (EUA).  This EUA will remain in effect (meaning this test can be used) for the duration of the COVID-19 declaration under Section 564(b)(1) of the Act, 21 U.S.C. section 360bbb-3(b)(1), unless the authorization is terminated or revoked sooner.  Performed at [facility], [address] [place], [place] [address]      Labs: Basic Metabolic Panel: Recent Labs  Lab 05/23/20 0155 05/23/20 1200 05/24/20 0144 05/25/20 0145 05/25/20 1345 05/26/20  0258  NA 135 131* 134* 133* 136 129*  K 4.0 4.1 3.9 4.1 3.8 4.2  CL 100 97* 98 91*  --  92*  CO2 25 24 26 31   --  26  GLUCOSE 156* 195* 128* 178*  --  252*  BUN 10 11 14 16   --  21*  CREATININE 0.83 0.89 1.04 1.25*  --  1.20  CALCIUM 8.5* 8.4* 8.5* 8.7*  --  8.7*  MG 1.7  --   --  2.1  --   --    Liver Function Tests: Recent Labs  Lab 05/22/20 1208 05/23/20 0155 05/23/20 1200  AST 14* 13* 12*  ALT 17 16 14   ALKPHOS 34* 28* 30*  BILITOT 0.1* 0.7 0.4  PROT 7.2 6.1* 6.0*  ALBUMIN 3.1* 2.5* 2.4*   No results for input(s): LIPASE, AMYLASE in the last 168 hours. No results for input(s): AMMONIA in the last 168 hours. CBC: Recent Labs  Lab 05/22/20 1129 05/23/20 0155 05/23/20 1200 05/24/20 0144 05/25/20 1345 05/26/20 0258  WBC 9.6 10.1 9.3 8.9  --  6.8  NEUTROABS 6.3  --   --   --   --  3.6  HGB 11.6* 11.1* 10.9* 11.3* 12.2* 12.2*  HCT 35.8* 34.3* 32.7* 35.8* 36.0* 35.8*  MCV 88.6 88.9 88.1 88.6  --  86.1  PLT 355 291 342 342  --  360   Cardiac Enzymes: No results for input(s): CKTOTAL, CKMB, CKMBINDEX, TROPONINI in the last 168 hours. BNP: BNP (last 3 results) Recent Labs    05/22/20 1129  BNP 636.6*    ProBNP (last 3 results) No results for input(s): PROBNP in the last 8760 hours.  CBG: Recent Labs  Lab 05/25/20 1056 05/25/20 1610 05/25/20 2054 05/26/20 0601 05/26/20 1115  GLUCAP 235* 191* 195* 212* 188*     Active Problems:   Pulmonary edema cardiac cause (HCC)   Acute systolic CHF (congestive heart failure), NYHA class 3 (HCC)   Time coordinating discharge: 38 minutes.  Signed:        Teaghan Formica, DO [facility]  05/26/2020, 3:31 PM

## 2020-05-26 NOTE — Progress Notes (Signed)
Mobility Specialist - Progress Note   05/26/20 1345  Mobility  Activity Ambulated in hall  Level of Assistance Standby assist, set-up cues, supervision of patient - no hands on  Assistive Device None  Distance Ambulated (ft) 490 ft  Mobility Response Tolerated well  Mobility performed by Mobility specialist  $Mobility charge 1 Mobility   Pre-mobility: 86 HR During mobility: 97 HR Post-mobility: 88 HR  Asx throughout ambulation. Pt back in bed after walk.   Pricilla Handler Mobility Specialist Mobility Specialist Phone: [phone]

## 2020-05-26 NOTE — Progress Notes (Signed)
RN supervised student nurse ambulating patient on room air while monitoring oxygen saturation. RN confirms assessment and documentation by student nurse is accurate. Fuller Canada, RN

## 2020-05-26 NOTE — Evaluation (Signed)
Occupational Therapy Evaluation Patient Details Name: Derek Thompson MRN: [id number] DOB: [birth year] Today's Date: 05/26/2020    History of Present Illness Pt is a 60 yo male s/p dyspnea and heart failure requiring O2 upon admission. PMhx: CHF   Clinical Impression   Pt PTA: independent. Pt currently requiring no physical assist; pt independent with ADL and mobility. Energy conservation education provided. Pt ambulatory >200' in hallway with no s/s.  Pt does not require continued OT skilled services. OT signing off. Thank you.    Follow Up Recommendations  No OT follow up    Equipment Recommendations  None recommended by OT    Recommendations for Other Services       Precautions / Restrictions Precautions Precautions: None Restrictions Weight Bearing Restrictions: No      Mobility Bed Mobility               General bed mobility comments: in recliner pre and post session    Transfers Overall transfer level: Modified independent                    Balance Overall balance assessment: No apparent balance deficits (not formally assessed)                                         ADL either performed or assessed with clinical judgement   ADL Overall ADL's : Modified independent                                       General ADL Comments: Pt requiring no physical assist; pt independent with ADl and mobility.     Vision Baseline Vision/History: Wears glasses Wears Glasses: At all times Patient Visual Report: No change from baseline Vision Assessment?: No apparent visual deficits     Perception     Praxis      Pertinent Vitals/Pain Pain Assessment: No/denies pain     Hand Dominance Right   Extremity/Trunk Assessment Upper Extremity Assessment Upper Extremity Assessment: Overall WFL for tasks assessed   Lower Extremity Assessment Lower Extremity Assessment: Overall WFL for tasks assessed   Cervical / Trunk  Assessment Cervical / Trunk Assessment: Normal   Communication Communication Communication: No difficulties   Cognition Arousal/Alertness: Awake/alert Behavior During Therapy: WFL for tasks assessed/performed Overall Cognitive Status: Within Functional Limits for tasks assessed                                     General Comments  Ambulatoruy >200' in hallway with no s/s of distress,    Exercises     Shoulder Instructions      Home Living Family/patient expects to be discharged to:: Private residence Living Arrangements: Alone Available Help at Discharge: Family;Available PRN/intermittently Type of Home: House       Home Layout: One level     Bathroom Shower/Tub: Walk-in shower                    Prior Functioning/Environment Level of Independence: Independent                 OT Problem List:        OT Treatment/Interventions:      OT Goals(Current goals can be  found in the care plan section)    OT Frequency:     Barriers to D/Thompson:            Co-evaluation              AM-PAC OT "6 Clicks" Daily Activity     Outcome Measure Help from another person eating meals?: None Help from another person taking care of personal grooming?: None Help from another person toileting, which includes using toliet, bedpan, or urinal?: None Help from another person bathing (including washing, rinsing, drying)?: None Help from another person to put on and taking off regular upper body clothing?: None Help from another person to put on and taking off regular lower body clothing?: None 6 Click Score: 24   End of Session Nurse Communication: Mobility status  Activity Tolerance: Patient tolerated treatment well Patient left: in chair;with call bell/phone within reach  OT Visit Diagnosis: Unsteadiness on feet (R26.81)                Time: 4944-9675 OT Time Calculation (min): 12 min Charges:  OT General Charges $OT Visit: 1 Visit OT  Evaluation $OT Eval Low Complexity: 1 Low  Jefferey Pica, OTR/L Acute Rehabilitation Services Pager: [phone] Office: [phone]   Derek Thompson 05/26/2020, 5:26 PM

## 2020-05-26 NOTE — Progress Notes (Signed)
Patient ambulated with student nurse 480 feet (up on down the hall way) on room air. The SPO2 monitor recorded oxygen saturation between 96-98% and the patient verbalized no difficulty breathing. Patient also reported zero chest pain and no issues ambulating. Patient returned to room and was left sitting up in bed and verbalized no concern at this time.

## 2020-05-26 NOTE — Progress Notes (Signed)
Heart Failure Stewardship Pharmacist Progress Note  Was successful in obtaining a copay card for [place].  This copay card will make the patients copay $0.  The billing information is as follows and has been shared with the patients pharmacy.    RxBin: [id number] PCN: [id number] Member ID: [id number] Group ID: [id number]   Kerby Nora, PharmD, BCPS Heart Failure Stewardship Pharmacist Phone [phone]

## 2020-05-27 ENCOUNTER — Encounter: Payer: Self-pay | Admitting: *Deleted

## 2020-05-27 ENCOUNTER — Other Ambulatory Visit: Payer: Self-pay | Admitting: Cardiology

## 2020-05-27 DIAGNOSIS — R55 Syncope and collapse: Secondary | ICD-10-CM

## 2020-05-27 NOTE — Progress Notes (Signed)
Patient ID: Derek Thompson, male   DOB: [birth year], 60 y.o.   MRN: [id number] Patient enrolled for [employer] to ship a 30 day cardiac event monitor to his home. Letter with instructions mailed to patient.

## 2020-06-07 NOTE — Progress Notes (Signed)
Cardiology Office Note:    Date:  06/10/2020   ID:  [name], DOB 13-Jun-1960, MRN [id number]  PCP:  Patient, No Pcp Per  Cardiologist:  Pixie Casino, MD  Electrophysiologist:  None   Referring MD: No ref. provider found   Chief Complaint: hospital follow-up for CHF  History of Present Illness:    Jaylenn Lust is a 60 y.o. 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 Debara Pickett and presents today for hospital follow-up for CHF.  Patient recently admitted from 05/22/2020 to 05/26/2020 with acute systolic CHF at presenting with increasing shortness of breath and orthopnea as well as one episode of syncope. He was noted to be hypoxic in the ED and briefly required BiPAP. Chest CTA negative for PE but significant bilateral pleural effusions. Echo showed LVEF of 25-30% with akinesis of the asal-mid inferoseptal wall, anteroseptal wall, apical septal wall, and entire inferior wall as well as severe hypokinesis of the basal-mid inferolateral wall. Patient was diuresed and then underwent right/left cardiac catheterization which showed 80% stenosis of a ramus intermediate branch with non-obstructive disease elsewhere. Also showed normal right heart pressures, low LV filling pressures, and preserved cardiac output. LV dysfunction was felt to be out of proportion to degree of CAD. Medical therapy was recommended. Patient was started on Spironolactone and [place]. Cardiology recommended Toprol-XL 66m daily at discharge but unfortunately he was not discharged on this. ARB/ARNI was not added due to soft BP. He was noted to have SVT with aberrancy during admission but had no recurrence of this once beta-blocker was started. Also started on aspirin and statin during admission for new  diagnosis of CAD. Plan was for outpatient monitor for further evaluation of syncope.  Patient presents today for hospital follow-up. Here alone. Doing well since discharge. Weight is up on our scales but stable on home scales. He notes some some shortness of breath if he over exerts himself such as walking up a hill or if he talks for a long time. However, he states his breathing is significantly better from where it was before recent admission. No orthopnea or PND. No lower extremity edema. No chest pain. No significant palpitations. No lightheadedness or dizziness. No recurrent syncopal episodes. He is compliant with all his medications and is tolerating them well.  Past Medical History:  Diagnosis Date  . Diabetes mellitus without complication ([place]   . Hypertension     Past Surgical History:  Procedure Laterality Date  . RIGHT/LEFT HEART CATH AND CORONARY ANGIOGRAPHY N/A 05/25/2020   Procedure: RIGHT/LEFT HEART CATH AND CORONARY ANGIOGRAPHY;  Surgeon: JMartinique Peter M, MD;  Location[facility];  Service: Cardiovascular;  Laterality: N/A;    Current Medications: Current Meds  Medication Sig  . Ascorbic Acid (VITAMIN C PO) Take 1 tablet by mouth daily.  .Marland Kitchenaspirin EC 81 MG EC tablet Take 1 tablet (81 mg total) by mouth daily. Swallow whole.  .Marland Kitchenatorvastatin (LIPITOR) 40 MG tablet Take 1 tablet (40 mg total) by mouth daily.  . B Complex Vitamins (VITAMIN B COMPLEX) TABS Take 1 tablet by mouth daily.  . blood glucose meter kit and supplies KIT Dispense based on patient and insurance preference. Use up to four times daily as directed. (FOR ICD-9 250.00, 250.01).  . dapagliflozin propanediol (FARXIGA) 10 MG TABS  tablet Take 1 tablet (10 mg total) by mouth daily.  . Flaxseed, Linseed, (FLAX SEED OIL PO) Take 1 capsule by mouth daily.  Marland Kitchen glipiZIDE (GLUCOTROL) 5 MG tablet Take 1 tablet (5 mg total) by mouth 2 (two) times daily.  . metFORMIN (GLUCOPHAGE) 500 MG tablet Take 1 tablet (500 mg  total) by mouth 2 (two) times daily with a meal.  . ondansetron (ZOFRAN ODT) 8 MG disintegrating tablet Take 1 tablet (8 mg total) by mouth every 8 (eight) hours as needed for nausea or vomiting.  . polyethylene glycol (MIRALAX / GLYCOLAX) 17 g packet Take 17 g by mouth daily.  Marland Kitchen spironolactone (ALDACTONE) 25 MG tablet Take 0.5 tablets (12.5 mg total) by mouth daily.  Marland Kitchen VITAMIN E PO Take 1 capsule by mouth daily.     Allergies:   Patient has no known allergies.   Social History   Socioeconomic History  . Marital status: Divorced    Spouse name: Not on file  . Number of children: Not on file  . Years of education: Not on file  . Highest education level: Not on file  Occupational History  . Not on file  Tobacco Use  . Smoking status: Former Smoker    Packs/day: 1.00    Years: 43.00    Pack years: 43.00    Quit date: 05/22/2017    Years since quitting: 3.0  . Smokeless tobacco: Never Used  Substance and Sexual Activity  . Alcohol use: Not Currently    Comment: last drink 2015  . Drug use: Not Currently  . Sexual activity: Not on file  Other Topics Concern  . Not on file  Social History Narrative  . Not on file   Social Determinants of Health   Financial Resource Strain: Not on file  Food Insecurity: Not on file  Transportation Needs: Not on file  Physical Activity: Not on file  Stress: Not on file  Social Connections: Not on file     Family History: The patient's family history includes Cancer in his father and mother.  ROS:   Please see the history of present illness.     EKGs/Labs/Other Studies Reviewed:    The following studies were reviewed today:  Echocardiogram 05/23/2020: Impressions: 1. Left ventricular ejection fraction, by estimation, is 25 to 30%. The  left ventricle has severely decreased function. The left ventricle  demonstrates regional wall motion abnormalities (see scoring  diagram/findings for description). Left ventricular  diastolic  parameters are indeterminate. Elevated left ventricular  end-diastolic pressure. There is akinesis of the left ventricular,  basal-mid inferoseptal wall and anteroseptal wall. There is akinesis of  the left ventricular, apical septal wall. There  is akinesis of the left ventricular, entire inferior wall. There is severe  hypokinesis of the left ventricular, basal-mid inferolateral wall.  2. Right ventricular systolic function is normal. The right ventricular  size is normal. Tricuspid regurgitation signal is inadequate for assessing  PA pressure.  3. The mitral valve is degenerative. Mild mitral valve regurgitation. No  evidence of mitral stenosis.  4. The aortic valve has an indeterminant number of cusps. Aortic valve  regurgitation is not visualized. No aortic stenosis is present.  5. The inferior vena cava is normal in size with greater than 50%  respiratory variability, suggesting right atrial pressure of 3 mmHg.  _______________  Right/Left Cardiac Catheterization 05/25/2020:  Prox RCA to Mid RCA lesion is 60% stenosed.  Dist RCA lesion is 50% stenosed.  Ost LAD to Mid LAD  lesion is 35% stenosed.  1st Diag lesion is 30% stenosed.  Dist LAD lesion is 50% stenosed.  Ramus lesion is 80% stenosed.  Ost Cx to Prox Cx lesion is 50% stenosed.  LV end diastolic pressure is normal.   1. Single vessel obstructive CAD involving a ramus intermediate branch 2. Normal right heart pressures. PAP mean 11 mm Hg 3. Low LV filling pressures. PCWP 5 mm Hg.  4. Preserved cardiac output with index 2.79.   Plan: LV dysfunction is out of proportion to degree of CAD. Recommend  medical therapy. Optimize CHF therapy. Appears to be volume depleted currently.  Diagnostic Dominance: Right     EKG:  EKG ordered today. EKG was personally reviewed and demonstrates sinus tachycardia, rate 114, with LBBB. No significant changes compared to prior tracing.  Recent Labs: 05/22/2020: B  Natriuretic Peptide 636.6 05/23/2020: ALT 14; TSH 0.632 05/25/2020: Magnesium 2.1 05/26/2020: BUN 21; Creatinine, Ser 1.20; Hemoglobin 12.2; Platelets 360; Potassium 4.2; Sodium 129  Recent Lipid Panel    Component Value Date/Time   CHOL 147 05/24/2020 0144   TRIG 94 05/24/2020 0144   HDL 31 (L) 05/24/2020 0144   CHOLHDL 4.7 05/24/2020 0144   VLDL 19 05/24/2020 0144   LDLCALC 97 05/24/2020 0144    Physical Exam:    Vital Signs: BP 138/76   Pulse (!) 111   Ht 6' (1.829 m)   Wt 173 lb (78.5 kg)   SpO2 99%   BMI 23.46 kg/m     Wt Readings from Last 3 Encounters:  06/10/20 173 lb (78.5 kg)  05/26/20 160 lb 14.4 oz (73 kg)  05/03/20 190 lb (86.2 kg)     General: 60 y.o. African-American male in no acute distress. HEENT: Normocephalic and atraumatic. Sclera clear.  Neck: Supple. Bilateral carotid bruits. No JVD. Heart: Tachycardic with regularly rhythm. Distinct S1 and S2. No murmurs, gallops, or rubs. Radial pulses 2+ and equal bilaterally. Right radial cath site soft with no signs of hematoma. Lungs: No increased work of breathing. Clear to ausculation bilaterally. No wheezes, rhonchi, or rales.  Abdomen: Soft, non-distended, and non-tender to palpation. Extremities: No lower extremity edema.    Skin: Warm and dry. Neuro: Alert and oriented x3. No focal deficits. Psych: Normal affect. Responds appropriately.  Assessment:    1. Chronic combined systolic and diastolic CHF (congestive heart failure) ([place])   2. Non-ischemic cardiomyopathy ([place])   3. Coronary artery disease involving native coronary artery of native heart without angina pectoris   4. Syncope, unspecified syncope type   5. Primary hypertension   6. Dyslipidemia   7. Type 2 diabetes mellitus with complication, without long-term current use of insulin ([place])   8. Bilateral carotid bruits     Plan:    Chronic Systolic CHF Non-Ischemic Cardiomyopathy - Recently admitted for new onset systolic CHF. LV  dysfunction felt to be out of proportion to degree of CAD. - Echo showed LVEF of 25-30% with multiple wall motion abnormalities.  - Appears euvolemic on exam.  - Will start Entresto 24-32m twice daily. - Was supposed to be discharged on Toprol-XL 260mdaily but it does not look like this was done. He is tachycardic today. Will start Toprol-XL 2570maily. - Continue Spironolactone 12.5mg37mily. - Continue Farxiga 10mg76mly. - Will repeat BMET today and then again in 1 week after starting Entresto. Will have patient follow-up with me or PharmD in 2-3 weeks to further adjust medications if able. - Will need repeat Echo in  3 months after optimization of GDMT.  CAD - LHC during recent admission showed 80% stenosis of a ramus intermediate branch with non-obstructive disease elsewhere. Medical therapy was recommended. - No angina. - Continue aspirin and high-intensity statin.  - Will start Toprol-XL as above.  Syncope - Patient had 2 reported episodes of syncope prior to recent admission.  - Outpatient monitor pending.   Hypertension - BP well controlled today at 138/72. Systolic BP running in the 120's to 140's at home. - Continue Spironolactone 12.64m daily. - Will start Entresto and Toprol as above.  Dyslipidemia - Lipid panel in 05/2020: Total Cholesterol 147, Triglycerides 94, HDL 31, LDL 97. - LDL goal <70 given CAD. - Started on Lipitor 465mdaily during recent admission. - Will check lipid panel and LFTs in 2 months.   Type 2 Diabetes Mellitus - On Metformin and Farxiga at home. - Management per PCP.  Carotid Bruits - Bilateral bruits noted on exam. - Will order carotid ultrasound.  Disposition: Follow up in 2 weeks with me of PharmD.   Medication Adjustments/Labs and Tests Ordered: Current medicines are reviewed at length with the patient today.  Concerns regarding medicines are outlined above.  No orders of the defined types were placed in this encounter.  No orders  of the defined types were placed in this encounter.   There are no Patient Instructions on file for this visit.   Signed, CaDarreld McleanPA-C  06/10/2020 4:20 PM    [facility]

## 2020-06-10 ENCOUNTER — Other Ambulatory Visit: Payer: Self-pay

## 2020-06-10 ENCOUNTER — Encounter: Payer: Self-pay | Admitting: Student

## 2020-06-10 ENCOUNTER — Ambulatory Visit (INDEPENDENT_AMBULATORY_CARE_PROVIDER_SITE_OTHER): Payer: BC Managed Care – PPO | Admitting: Student

## 2020-06-10 VITALS — BP 138/76 | HR 111 | Ht 72.0 in | Wt 173.0 lb

## 2020-06-10 DIAGNOSIS — I428 Other cardiomyopathies: Secondary | ICD-10-CM

## 2020-06-10 DIAGNOSIS — I2510 Atherosclerotic heart disease of native coronary artery without angina pectoris: Secondary | ICD-10-CM | POA: Insufficient documentation

## 2020-06-10 DIAGNOSIS — R55 Syncope and collapse: Secondary | ICD-10-CM | POA: Diagnosis not present

## 2020-06-10 DIAGNOSIS — E119 Type 2 diabetes mellitus without complications: Secondary | ICD-10-CM | POA: Insufficient documentation

## 2020-06-10 DIAGNOSIS — E785 Hyperlipidemia, unspecified: Secondary | ICD-10-CM | POA: Insufficient documentation

## 2020-06-10 DIAGNOSIS — I5042 Chronic combined systolic (congestive) and diastolic (congestive) heart failure: Secondary | ICD-10-CM

## 2020-06-10 DIAGNOSIS — R0989 Other specified symptoms and signs involving the circulatory and respiratory systems: Secondary | ICD-10-CM

## 2020-06-10 DIAGNOSIS — I10 Essential (primary) hypertension: Secondary | ICD-10-CM

## 2020-06-10 DIAGNOSIS — E118 Type 2 diabetes mellitus with unspecified complications: Secondary | ICD-10-CM | POA: Insufficient documentation

## 2020-06-10 MED ORDER — METOPROLOL SUCCINATE ER 25 MG PO TB24: 25.0000 mg | ORAL_TABLET | Freq: Every day | ORAL | 1 refills | Status: DC

## 2020-06-10 MED ORDER — ENTRESTO 24-26 MG PO TABS: 1.0000 | ORAL_TABLET | Freq: Two times a day (BID) | ORAL | 1 refills | Status: DC

## 2020-06-10 NOTE — Patient Instructions (Signed)
Medication Instructions:  Start Entresto 24/26 mg (1 Tablet Twice Daily)Start Toprol XL 25mg  (1  Tablet Daily)  *If you need a refill on your cardiac medications before your next appointment, please call your pharmacy*   Lab Work: BMP If you have labs (blood work) drawn today and your tests are completely normal, you will receive your results only by: Marland Kitchen MyChart Message (if you have MyChart) OR . A paper copy in the mail If you have any lab test that is abnormal or we need to change your treatment, we will call you to review the results.   Testing/Procedures: [place], [place] has requested that you have a carotid duplex. This test is an ultrasound of the carotid arteries in your neck. It looks at blood flow through these arteries that supply the brain with blood. Allow one hour for this exam. There are no restrictions or special instructions.    Follow-Up: At [facility], you and your health needs are our priority.  As part of our continuing mission to provide you with exceptional heart care, we have created designated Provider Care Teams.  These Care Teams include your primary Cardiologist (physician) and Advanced Practice Providers (APPs -  Physician Assistants and Nurse Practitioners) who all work together to provide you with the care you need, when you need it.  We recommend signing up for the patient portal called "MyChart".  Sign up information is provided on this After Visit Summary.  MyChart is used to connect with patients for Virtual Visits (Telemedicine).  Patients are able to view lab/test results, encounter notes, upcoming appointments, etc.  Non-urgent messages can be sent to your provider as well.   To learn more about what you can do with MyChart, go to [email or web address].    Your next appointment:   February 22,2022 1:45pm  The format for your next appointment:   In Person  Provider:   Sande Rives PA-C

## 2020-06-11 LAB — BASIC METABOLIC PANEL
BUN/Creatinine Ratio: 19 (ref 10–24)
BUN: 20 mg/dL (ref 8–27)
CO2: 24 mmol/L (ref 20–29)
Calcium: 9.2 mg/dL (ref 8.6–10.2)
Chloride: 102 mmol/L (ref 96–106)
Creatinine, Ser: 1.05 mg/dL (ref 0.76–1.27)
GFR calc Af Amer: 89 mL/min/{1.73_m2} (ref >59)
GFR calc non Af Amer: 77 mL/min/{1.73_m2} (ref >59)
Glucose: 91 mg/dL (ref 65–99)
Potassium: 4.9 mmol/L (ref 3.5–5.2)
Sodium: 142 mmol/L (ref 134–144)

## 2020-06-20 NOTE — Progress Notes (Deleted)
Cardiology Office Note:    Date:  06/20/2020   ID:  Derek Thompson, DOB [birth year], MRN [id number]  PCP:  Patient, No Pcp Per  Cardiologist:  Pixie Casino, MD  Electrophysiologist:  None   Referring MD: No ref. provider found   Chief Complaint: follow-up of CHF  History of Present Illness:    Derek Thompson is a 60 y.o. 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 Debara Pickett and presents today for follow-up for CHF.  Patient recently admitted from 05/22/2020 to 05/26/2020 with acute systolic CHF at presenting with increasing shortness of breath and orthopnea as well as one episode of syncope. He was noted to be hypoxic in the ED and briefly required BiPAP. Chest CTA negative for PE but significant bilateral pleural effusions. Echo showed LVEF of 25-30% with akinesis of the asal-mid inferoseptal wall, anteroseptal wall, apical septal wall, and entire inferior wall as well as severe hypokinesis of the basal-mid inferolateral wall. Patient was diuresed and then underwent right/left cardiac catheterization which showed 80% stenosis of a ramus intermediate branch with non-obstructive disease elsewhere. Also showed normal right heart pressures, low LV filling pressures, and preserved cardiac output. LV dysfunction was felt to be out of proportion to degree of CAD. Medical therapy was recommended. Patient was started on Spironolactone and [place]. Cardiology recommended Toprol-XL 25mg  daily at discharge but unfortunately he was not discharged on this. ARB/ARNI was not added due to soft BP. He was noted to have SVT with aberrancy during admission but had no recurrence of this once beta-blocker was started. Also started on aspirin and statin during admission for new diagnosis of CAD.  Plan was for outpatient monitor for further evaluation of syncope.  I saw the patient for follow-up on 06/10/2020 at which time he reported some shortness of breath if he over exerts himself but was overall doing much better. He was tachycardic at visit with EKG showing sinus tachycardia with rate of 114 bpm. I started the patient on Entresto 24-26mg  twice daily and Toprol-XL 25mg  daily. Carotid bruit was noted on exam so carotid dopplers were also ordered. He was advised to follow-up in 2 weeks for further medication titration.   Patient presents today for follow-up. ***  Chronic Systolic CHF Non-Ischemic Cardiomyopathy - Recently admitted in 05/2020 for new onset systolic CHF. LV dysfunction felt to be out of proportion to degree of CAD. - Echo showed LVEF of 25-30% with multiple wall motion abnormalities.  - Appears euvolemic on exam.  *** - Continue Entresto 24-26mg  twice daily. *** - Continue Toprol-XL 25mg  daily. *** - Continue Spironolactone 12.5mg  daily. - Continue Farxiga 10mg  daily. - Will check BMET today.  - Will need repeat Echo in 3 months after optimization of GDMT. ***  CAD - LHC during recent admission showed 80% stenosis of a ramus intermediate branch with non-obstructive disease elsewhere. Medical therapy was recommended. - No angina. *** - Continue aspirin, beta-blocker, and high-intensity statin.  Syncope - Patient had 2 reported episodes of syncope prior to recent admission.  - Monitor pending. ***  Hypertension - *** - Continue medications for CHF as above.  Dyslipidemia - Lipid panel in 05/2020: Total Cholesterol 147, Triglycerides 94, HDL 31, LDL 97. - LDL goal <70 given CAD. - Started on Lipitor 40mg  daily during recent admission. - Will check  lipid panel and LFTs in 2 months. ***  Type 2 Diabetes Mellitus - On Metformin and Farxiga at home. - Management per PCP.  Carotid Bruits  - Bilateral bruits noted on exam at last visit - Carotid dopplers  scheduled for 06/29/2020.  Past Medical History:  Diagnosis Date  . Diabetes mellitus without complication ([place])   . Hypertension     Past Surgical History:  Procedure Laterality Date  . RIGHT/LEFT HEART CATH AND CORONARY ANGIOGRAPHY N/A 05/25/2020   Procedure: RIGHT/LEFT HEART CATH AND CORONARY ANGIOGRAPHY;  Surgeon: Thompson, Derek M, MD;  Location: [facility];  Service: Cardiovascular;  Laterality: N/A;    Current Medications: No outpatient medications have been marked as taking for the 06/23/20 encounter (Appointment) with Darreld Mclean, PA-C.     Allergies:   Patient has no known allergies.   Social History   Socioeconomic History  . Marital status: Divorced    Spouse name: Not on file  . Number of children: Not on file  . Years of education: Not on file  . Highest education level: Not on file  Occupational History  . Not on file  Tobacco Use  . Smoking status: Former Smoker    Packs/day: 1.00    Years: 43.00    Pack years: 43.00    Quit date: 05/22/2017    Years since quitting: 3.0  . Smokeless tobacco: Never Used  Substance and Sexual Activity  . Alcohol use: Not Currently    Comment: last drink 2015  . Drug use: Not Currently  . Sexual activity: Not on file  Other Topics Concern  . Not on file  Social History Narrative  . Not on file   Social Determinants of Health   Financial Resource Strain: Not on file  Food Insecurity: Not on file  Transportation Needs: Not on file  Physical Activity: Not on file  Stress: Not on file  Social Connections: Not on file     Family History: The patient's family history includes Cancer in his father and mother.  ROS:   Please see the history of present illness.     EKGs/Labs/Other Studies Reviewed:    The following studies were reviewed today:  Echocardiogram 05/23/2020: Impressions: 1. Left ventricular ejection fraction, by estimation, is 25 to 30%. The  left ventricle has severely decreased  function. The left ventricle  demonstrates regional wall motion abnormalities (see scoring  diagram/findings for description). Left ventricular  diastolic parameters are indeterminate. Elevated left ventricular  end-diastolic pressure. There is akinesis of the left ventricular,  basal-mid inferoseptal wall and anteroseptal wall. There is akinesis of  the left ventricular, apical septal wall. There  is akinesis of the left ventricular, entire inferior wall. There is severe  hypokinesis of the left ventricular, basal-mid inferolateral wall.  2. Right ventricular systolic function is normal. The right ventricular  size is normal. Tricuspid regurgitation signal is inadequate for assessing  PA pressure.  3. The mitral valve is degenerative. Mild mitral valve regurgitation. No  evidence of mitral stenosis.  4. The aortic valve has an indeterminant number of cusps. Aortic valve  regurgitation is not visualized. No aortic stenosis is present.  5. The inferior vena cava is normal in size with greater than 50%  respiratory variability, suggesting right atrial pressure of 3 mmHg.  _______________  Right/Left Cardiac Catheterization 05/25/2020:  Prox RCA to Mid RCA lesion is 60% stenosed.  Dist RCA lesion is 50% stenosed.  Ost LAD to Mid LAD lesion is 35%  stenosed.  1st Diag lesion is 30% stenosed.  Dist LAD lesion is 50% stenosed.  Ramus lesion is 80% stenosed.  Ost Cx to Prox Cx lesion is 50% stenosed.  LV end diastolic pressure is normal.  1. Single vessel obstructive CAD involving a ramus intermediate branch 2. Normal right heart pressures. PAP mean 11 mm Hg 3. Low LV filling pressures. PCWP 5 mm Hg.  4. Preserved cardiac output with index 2.79.   Plan: LV dysfunction is out of proportion to degree of CAD. Recommend medical therapy. Optimize CHF therapy. Appears to be volume depleted currently.  Diagnostic Dominance: Right     _______________  Carotid Dopplers  06/22/2020:  ***  EKG:  EKG not ordered today.   Recent Labs: 05/22/2020: B Natriuretic Peptide 636.6 05/23/2020: ALT 14; TSH 0.632 05/25/2020: Magnesium 2.1 05/26/2020: Hemoglobin 12.2; Platelets 360 06/10/2020: BUN 20; Creatinine, Ser 1.05; Potassium 4.9; Sodium 142  Recent Lipid Panel    Component Value Date/Time   CHOL 147 05/24/2020 0144   TRIG 94 05/24/2020 0144   HDL 31 (L) 05/24/2020 0144   CHOLHDL 4.7 05/24/2020 0144   VLDL 19 05/24/2020 0144   LDLCALC 97 05/24/2020 0144    Physical Exam:    Vital Signs: There were no vitals taken for this visit.    Wt Readings from Last 3 Encounters:  06/10/20 173 lb (78.5 kg)  05/26/20 160 lb 14.4 oz (73 kg)  05/03/20 190 lb (86.2 kg)     General: 60 y.o. male in no acute distress. HEENT: Normocephalic and atraumatic. Sclera clear. EOMs intact. Neck: Supple. No carotid bruits. No JVD. Heart: *** RRR. Distinct S1 and S2. No murmurs, gallops, or rubs. Radial and distal pedal pulses 2+ and equal bilaterally. Lungs: No increased work of breathing. Clear to ausculation bilaterally. No wheezes, rhonchi, or rales.  Abdomen: Soft, non-distended, and non-tender to palpation. Bowel sounds present in all 4 quadrants.  MSK: Normal strength and tone for age. *** Extremities: No lower extremity edema.    Skin: Warm and dry. Neuro: Alert and oriented x3. No focal deficits. Psych: Normal affect. Responds appropriately.   Assessment:    No diagnosis found.  Plan:     Disposition: Follow up in ***   Medication Adjustments/Labs and Tests Ordered: Current medicines are reviewed at length with the patient today.  Concerns regarding medicines are outlined above.  No orders of the defined types were placed in this encounter.  No orders of the defined types were placed in this encounter.   There are no Patient Instructions on file for this visit.   Signed, Darreld Mclean, PA-C  06/20/2020 5:31 PM    [facility]

## 2020-06-22 ENCOUNTER — Ambulatory Visit (HOSPITAL_COMMUNITY)
Admission: RE | Admit: 2020-06-22 | Payer: BC Managed Care – PPO | Source: Ambulatory Visit | Attending: Student | Admitting: Student

## 2020-06-23 ENCOUNTER — Ambulatory Visit: Payer: BC Managed Care – PPO | Admitting: Student

## 2020-06-29 ENCOUNTER — Ambulatory Visit (HOSPITAL_COMMUNITY)
Admission: RE | Admit: 2020-06-29 | Discharge: 2020-06-29 | Disposition: A | Discharge status: Home / Self Care | Payer: BC Managed Care – PPO | Source: Ambulatory Visit | Attending: Internal Medicine | Admitting: Internal Medicine

## 2020-06-29 ENCOUNTER — Other Ambulatory Visit: Payer: Self-pay

## 2020-06-29 ENCOUNTER — Telehealth: Payer: Self-pay | Admitting: Student

## 2020-06-29 DIAGNOSIS — I5042 Chronic combined systolic (congestive) and diastolic (congestive) heart failure: Secondary | ICD-10-CM

## 2020-06-29 DIAGNOSIS — R0989 Other specified symptoms and signs involving the circulatory and respiratory systems: Secondary | ICD-10-CM | POA: Insufficient documentation

## 2020-06-29 DIAGNOSIS — I2510 Atherosclerotic heart disease of native coronary artery without angina pectoris: Secondary | ICD-10-CM | POA: Insufficient documentation

## 2020-06-29 DIAGNOSIS — R55 Syncope and collapse: Secondary | ICD-10-CM | POA: Diagnosis present

## 2020-06-29 DIAGNOSIS — I10 Essential (primary) hypertension: Secondary | ICD-10-CM | POA: Diagnosis present

## 2020-06-29 DIAGNOSIS — I428 Other cardiomyopathies: Secondary | ICD-10-CM | POA: Insufficient documentation

## 2020-06-29 DIAGNOSIS — E118 Type 2 diabetes mellitus with unspecified complications: Secondary | ICD-10-CM | POA: Diagnosis present

## 2020-06-29 DIAGNOSIS — Z79899 Other long term (current) drug therapy: Secondary | ICD-10-CM

## 2020-06-29 DIAGNOSIS — E785 Hyperlipidemia, unspecified: Secondary | ICD-10-CM | POA: Diagnosis present

## 2020-06-29 MED ORDER — DAPAGLIFLOZIN PROPANEDIOL 10 MG PO TABS: 10.0000 mg | ORAL_TABLET | Freq: Every day | ORAL | 11 refills | Status: DC

## 2020-06-29 NOTE — Telephone Encounter (Signed)
  Pt c/o medication issue:  1. Name of Medication: dapagliflozin propanediol (FARXIGA) 10 MG TABS tablet  2. How are you currently taking this medication (dosage and times per day)? Patient stopped taking because he is out  3. Are you having a reaction (difficulty breathing--STAT)?   4. What is your medication issue? Patient wanted to know if he still needs to take this medication or not.   He states he is starting to develop the cough he had the last time he had fluid on his lungs and is not sure what to do. Please advise

## 2020-06-29 NOTE — Telephone Encounter (Signed)
Left message for patient that he is to continue the [place] and refills have been sent to the pharmacy in his chart. He is to call back with continued symptoms after restarting his medications.

## 2020-07-02 ENCOUNTER — Other Ambulatory Visit: Payer: Self-pay

## 2020-07-02 DIAGNOSIS — I2510 Atherosclerotic heart disease of native coronary artery without angina pectoris: Secondary | ICD-10-CM

## 2020-07-02 DIAGNOSIS — Z79899 Other long term (current) drug therapy: Secondary | ICD-10-CM

## 2020-07-02 DIAGNOSIS — I10 Essential (primary) hypertension: Secondary | ICD-10-CM

## 2020-07-02 DIAGNOSIS — I5042 Chronic combined systolic (congestive) and diastolic (congestive) heart failure: Secondary | ICD-10-CM

## 2020-07-02 DIAGNOSIS — E785 Hyperlipidemia, unspecified: Secondary | ICD-10-CM

## 2020-07-02 DIAGNOSIS — E118 Type 2 diabetes mellitus with unspecified complications: Secondary | ICD-10-CM

## 2020-07-02 DIAGNOSIS — R0989 Other specified symptoms and signs involving the circulatory and respiratory systems: Secondary | ICD-10-CM

## 2020-07-02 DIAGNOSIS — R55 Syncope and collapse: Secondary | ICD-10-CM

## 2020-07-02 DIAGNOSIS — I428 Other cardiomyopathies: Secondary | ICD-10-CM

## 2020-07-04 LAB — BASIC METABOLIC PANEL
BUN/Creatinine Ratio: 15 (ref 10–24)
BUN: 17 mg/dL (ref 8–27)
CO2: 23 mmol/L (ref 20–29)
Calcium: 8.9 mg/dL (ref 8.6–10.2)
Chloride: 101 mmol/L (ref 96–106)
Creatinine, Ser: 1.17 mg/dL (ref 0.76–1.27)
Glucose: 245 mg/dL — ABNORMAL HIGH (ref 65–99)
Potassium: 4.8 mmol/L (ref 3.5–5.2)
Sodium: 137 mmol/L (ref 134–144)
eGFR: 71 mL/min/{1.73_m2} (ref >59)

## 2020-08-11 ENCOUNTER — Other Ambulatory Visit: Payer: Self-pay | Admitting: Student

## 2020-08-21 ENCOUNTER — Other Ambulatory Visit: Payer: Self-pay

## 2020-08-21 MED ORDER — ENTRESTO 24-26 MG PO TABS: 1.0000 | ORAL_TABLET | Freq: Two times a day (BID) | ORAL | 1 refills | Status: DC

## 2020-08-26 ENCOUNTER — Other Ambulatory Visit (HOSPITAL_COMMUNITY): Payer: Self-pay | Admitting: Urology

## 2020-08-26 DIAGNOSIS — C61 Malignant neoplasm of prostate: Secondary | ICD-10-CM

## 2020-08-28 ENCOUNTER — Telehealth: Payer: Self-pay | Admitting: Genetic Counselor

## 2020-08-28 NOTE — Telephone Encounter (Signed)
Received a genetic counseling referral from Dr. Abner Greenspan at [facility] for prostate cancer. [name] has been cld and scheduled to see Raquel Sarna on 5/17 at 1pm. Pt aware to arrive 15 minutes early.

## 2020-09-02 ENCOUNTER — Telehealth: Payer: Self-pay | Admitting: Student

## 2020-09-02 MED ORDER — DAPAGLIFLOZIN PROPANEDIOL 10 MG PO TABS: 10.0000 mg | ORAL_TABLET | Freq: Every day | ORAL | 0 refills | Status: DC

## 2020-09-02 NOTE — Telephone Encounter (Signed)
    *  STAT* If patient is at the pharmacy, call can be transferred to refill team.   1. Which medications need to be refilled? (please list name of each medication and dose if known)   dapagliflozin propanediol (FARXIGA) 10 MG TABS tablet    2. Which pharmacy/location (including street and city if local pharmacy) is medication to be sent to? [facility], [address], [place], [place] [address]  3. Do they need a 30 day or 90 day supply? 90 days  Pt said he needs 90 days to get it cheaper from insurance. Also pt is out of medication and need refill as soon as possible. Pt has new pharmacy, please send all refills to [facility], [address], [place], [place] [address]

## 2020-09-02 NOTE — Telephone Encounter (Signed)
Medication sent.

## 2020-09-10 ENCOUNTER — Other Ambulatory Visit: Payer: Self-pay | Admitting: Student

## 2020-09-10 ENCOUNTER — Other Ambulatory Visit: Payer: Self-pay

## 2020-09-10 ENCOUNTER — Ambulatory Visit (HOSPITAL_COMMUNITY)
Admission: RE | Admit: 2020-09-10 | Discharge: 2020-09-10 | Disposition: A | Discharge status: Home / Self Care | Payer: BC Managed Care – PPO | Source: Ambulatory Visit | Attending: Urology | Admitting: Urology

## 2020-09-10 DIAGNOSIS — C61 Malignant neoplasm of prostate: Secondary | ICD-10-CM | POA: Insufficient documentation

## 2020-09-10 MED ORDER — PIFLIFOLASTAT F 18 (PYLARIFY) INJECTION
9.0000 | Freq: Once | INTRAVENOUS | Status: AC
  Administered 2020-09-10: 9.8 via INTRAVENOUS

## 2020-09-10 NOTE — Telephone Encounter (Signed)
This is Dr. Hilty's pt 

## 2020-09-15 ENCOUNTER — Inpatient Hospital Stay: Payer: BC Managed Care – PPO

## 2020-09-15 ENCOUNTER — Encounter: Payer: Self-pay | Admitting: Genetic Counselor

## 2020-09-15 ENCOUNTER — Other Ambulatory Visit: Payer: Self-pay

## 2020-09-15 ENCOUNTER — Inpatient Hospital Stay: Payer: BC Managed Care – PPO | Attending: Genetic Counselor | Admitting: Genetic Counselor

## 2020-09-15 DIAGNOSIS — C61 Malignant neoplasm of prostate: Secondary | ICD-10-CM | POA: Diagnosis not present

## 2020-09-15 DIAGNOSIS — Z803 Family history of malignant neoplasm of breast: Secondary | ICD-10-CM | POA: Diagnosis not present

## 2020-09-15 NOTE — Progress Notes (Signed)
REFERRING PROVIDER: Janith Lima, MD [place],  [place] [address]  PRIMARY PROVIDER:  Benito Mccreedy, MD  PRIMARY REASON FOR VISIT:  1. Prostate cancer ([place])   2. Family history of breast cancer      HISTORY OF PRESENT ILLNESS:   Derek Thompson, a 60 y.o. male, was seen for a [facility] cancer genetics consultation at the request of Dr. Abner Thompson due to a personal and family history of cancer.  Derek Thompson presents to clinic today to discuss the possibility of a hereditary predisposition to cancer, genetic testing, and to further clarify his future cancer risks, as well as potential cancer risks for family members.   In 2022, at the age of 71, Derek Thompson was diagnosed with very high risk prostate cancer.    Past Medical History:  Diagnosis Date  . Diabetes mellitus without complication ([place])   . Family history of breast cancer   . Hypertension   . Prostate cancer [facility])     Past Surgical History:  Procedure Laterality Date  . RIGHT/LEFT HEART CATH AND CORONARY ANGIOGRAPHY N/A 05/25/2020   Procedure: RIGHT/LEFT HEART CATH AND CORONARY ANGIOGRAPHY;  Surgeon: Derek Thompson, Derek M, MD;  Location: [facility];  Service: Cardiovascular;  Laterality: N/A;    Social History   Socioeconomic History  . Marital status: Divorced    Spouse name: Not on file  . Number of children: Not on file  . Years of education: Not on file  . Highest education level: Not on file  Occupational History  . Not on file  Tobacco Use  . Smoking status: Former Smoker    Packs/day: 1.00    Years: 43.00    Pack years: 43.00    Quit date: 05/22/2017    Years since quitting: 3.3  . Smokeless tobacco: Never Used  Substance and Sexual Activity  . Alcohol use: Not Currently    Comment: last drink 2015  . Drug use: Not Currently  . Sexual activity: Not on file  Other Topics Concern  . Not on file  Social History Narrative  . Not on file   Social Determinants of Health   Financial Resource  Strain: Not on file  Food Insecurity: Not on file  Transportation Needs: Not on file  Physical Activity: Not on file  Stress: Not on file  Social Connections: Not on file     FAMILY HISTORY:  We obtained a detailed, 4-generation family history.  Significant diagnoses are listed below: Family History  Problem Relation Age of Onset  . Breast cancer Mother        dx 30s/40s, twice  . Cancer Mother        unknown type  . Cancer Maternal Grandfather        unknown type, d. >50   Derek Thompson has three sons (ages 79-33). He has one maternal half-brother (age 53). None of these relatives have had cancer.  Derek Thompson mother is alive at age 54 and has had breast cancer twice, first diagnosed in her 51s or 44s, as well as a third unknown type of cancer. There were 10 or 11 maternal aunts and 10 or 11 maternal uncles. Derek Thompson has limited information about the health history of these family members. Derek Thompson maternal grandmother died at age 72 without cancer. His maternal grandfather died older than 69 with cancer (unknown type).  Derek Thompson does not have any information about his biological father or paternal side of the family.  Derek Thompson is unaware of previous family history of genetic testing for hereditary cancer risks. Patient's maternal ancestors are of [place] and otherwise unknown descent, and paternal ancestors are of unknown descent. There is no reported Ashkenazi Jewish ancestry. There is no known consanguinity.  GENETIC COUNSELING ASSESSMENT: Derek Thompson is a 60 y.o. male with a personal history of very high risk prostate cancer and a family history of young-onset breast cancer, which is somewhat suggestive of a hereditary cancer syndrome and predisposition to cancer. We, therefore, discussed and recommended the following at today's visit.   DISCUSSION: We discussed that approximately 5-10% of prostate cancer is hereditary, with most cases associated with the BRCA1 and BRCA2  genes. There are other genes that can be associated with hereditary prostate cancer syndromes. These include HOXB13, CHEK2, etc. We discussed that testing is beneficial for several reasons, including knowing about other cancer risks, identifying potential screening and risk-reduction options that may be appropriate, identifying whether targeted treatment options such as PARP inhibitors would be beneficial, and to understand if other family members could be at risk for cancer and allow them to undergo genetic testing.  We reviewed the characteristics, features and inheritance patterns of hereditary cancer syndromes. We also discussed genetic testing, including the appropriate family members to test, the process of testing, insurance coverage and turn-around-time for results. We discussed the implications of a negative, positive and/or variant of uncertain significant result. We recommended Derek Thompson pursue genetic testing of the BRCA1 and BRCA2 genes, with reflex to the Common Hereditary Cancers + Prostate Cancer HRR panels.   The Common Hereditary Cancers Panel offered by [facility] includes sequencing and/or deletion duplication testing of the following 47 genes: APC, ATM, AXIN2, BARD1, BMPR1A, BRCA1, BRCA2, BRIP1, CDH1, CDK4, CDKN2A (p14ARF), CDKN2A (p16INK4a), CHEK2, CTNNA1, DICER1, EPCAM (Deletion/duplication testing only), GREM1 (promoter region deletion/duplication testing only), KIT, MEN1, MLH1, MSH2, MSH3, MSH6, MUTYH, NBN, NF1, NTHL1, PALB2, PDGFRA, PMS2, POLD1, POLE, PTEN, RAD50, RAD51C, RAD51D, SDHB, SDHC, SDHD, SMAD4, SMARCA4. STK11, TP53, TSC1, TSC2, and VHL. The following genes were evaluated for sequence changes only: SDHA and HOXB13 c.251G>A variant only. The Prostate Cancer HRR Panel offered by [facility] includes sequencing and/or deletion duplication testing of the following 10 genes: ATM, BARD1, BRCA1, BRCA2, BRIP1, CHEK2, FANCL, PALB2, RAD51C, RAD51D.   Based on Derek Thompson personal and family  history of cancer, he meets medical criteria for genetic testing. Despite that he meets criteria, there may still be an out of pocket cost. Derek Thompson expressed concern regarding the potential cost of genetic testing. Therefore, we will request a preverfication from the genetic testing laboratory to better estimate his out of pocket cost.  PLAN: After considering the risks, benefits, and limitations, Derek Thompson provided informed consent to pursue genetic testing and the blood sample was sent to [facility] for analysis of the BRCA1 and BRCA2 genes with reflex to the Common Hereditary Cancers panel + Prostate Cancer HRR panel. Results should be available within approximately two-three weeks' time, at which point they will be disclosed by telephone to Derek Thompson, as will any additional recommendations warranted by these results. Derek Thompson will receive a summary of his genetic counseling visit and a copy of his results once available. This information will also be available in Epic.   Derek Thompson questions were answered to his satisfaction today. Our contact information was provided should additional questions or concerns arise. Thank you for the referral and allowing [place] to share in the care of your patient.  Clint Guy, [place], [facility] Licensed, Certified Dispensing [email or web address] Phone: [phone]  The patient was seen for a total of 25 minutes in face-to-face genetic counseling.  This patient was discussed with Drs. Magrinat, Lindi Adie and/or Burr Medico who agrees with the above.    _______________________________________________________________________ For Office Staff:  Number of people involved in session: 1 Was an Intern/ student involved with case: no

## 2020-10-10 ENCOUNTER — Other Ambulatory Visit: Payer: Self-pay | Admitting: Cardiovascular Disease

## 2020-10-12 ENCOUNTER — Other Ambulatory Visit: Payer: Self-pay

## 2020-10-12 DIAGNOSIS — Z1379 Encounter for other screening for genetic and chromosomal anomalies: Secondary | ICD-10-CM | POA: Insufficient documentation

## 2020-10-12 NOTE — Progress Notes (Signed)
GU Location of Tumor / Histology: prostatic adenocarcinoma  If Prostate Cancer, Gleason Score is (5 + 4) and PSA is (33.1). Prostate volume: 69  Derek Thompson was noted to have a psa of 39.9 by his PCP in January 2022.   Biopsies of prostate (if applicable) revealed:    Past/Anticipated interventions by urology, if any: prostate biopsy, PET (nodal met?), referral to Dr. Tammi Klippel to discuss radiation options, ADT  Past/Anticipated interventions by medical oncology, if any: no  Weight changes, if any: Reports he lost 30 lbs but gained it all back  Bowel/Bladder complaints, if any: Denies dysuria, hematuria, urinary leakage or incontinence. Reports he has to wear diapers daily due to bowel incontinence.    Nausea/Vomiting, if any: denies  Pain issues, if any:  Reports low back pain. Reports numbness, tingling and weakness of lower extremities.  SAFETY ISSUES: Prior radiation? no Pacemaker/ICD? no Possible current pregnancy? no, male patient Is the patient on methotrexate? no  Current Complaints / other details:  60 year old male. Single. Resides in [place]. 3 sons.

## 2020-10-13 ENCOUNTER — Telehealth: Payer: Self-pay | Admitting: Genetic Counselor

## 2020-10-13 ENCOUNTER — Telehealth: Payer: Self-pay | Admitting: *Deleted

## 2020-10-13 ENCOUNTER — Ambulatory Visit
Admission: RE | Admit: 2020-10-13 | Discharge: 2020-10-13 | Disposition: A | Discharge status: Home / Self Care | Payer: BC Managed Care – PPO | Source: Ambulatory Visit | Attending: Radiation Oncology | Admitting: Radiation Oncology

## 2020-10-13 ENCOUNTER — Other Ambulatory Visit: Payer: Self-pay

## 2020-10-13 ENCOUNTER — Encounter: Payer: Self-pay | Admitting: Radiation Oncology

## 2020-10-13 DIAGNOSIS — Z7982 Long term (current) use of aspirin: Secondary | ICD-10-CM | POA: Insufficient documentation

## 2020-10-13 DIAGNOSIS — Z803 Family history of malignant neoplasm of breast: Secondary | ICD-10-CM | POA: Diagnosis not present

## 2020-10-13 DIAGNOSIS — Z87891 Personal history of nicotine dependence: Secondary | ICD-10-CM | POA: Diagnosis not present

## 2020-10-13 DIAGNOSIS — C61 Malignant neoplasm of prostate: Secondary | ICD-10-CM | POA: Diagnosis not present

## 2020-10-13 DIAGNOSIS — Z79899 Other long term (current) drug therapy: Secondary | ICD-10-CM | POA: Diagnosis not present

## 2020-10-13 DIAGNOSIS — Z8042 Family history of malignant neoplasm of prostate: Secondary | ICD-10-CM | POA: Diagnosis not present

## 2020-10-13 DIAGNOSIS — I10 Essential (primary) hypertension: Secondary | ICD-10-CM | POA: Diagnosis not present

## 2020-10-13 DIAGNOSIS — E119 Type 2 diabetes mellitus without complications: Secondary | ICD-10-CM | POA: Diagnosis not present

## 2020-10-13 NOTE — Telephone Encounter (Signed)
Called patient to inform of ADT appt. for 10-20-20 - arrival time- 2:15 pm @ Dr. Virgina [place] Office, lvm for a return call

## 2020-10-13 NOTE — Telephone Encounter (Signed)
LVM that his genetic test results are available and requested that he call back to discuss them.  

## 2020-10-13 NOTE — Progress Notes (Signed)
Radiation Oncology         [phone] ________________________________  Initial outpatient Consultation  Name: Derek Thompson MRN: [id number]  Date: 10/13/2020  DOB: Aug 24, 1960  JL:[id number], [name], MD  Janith Lima, MD   REFERRING PHYSICIAN: Janith Lima, MD  DIAGNOSIS: 60 y.o. 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    ICD-10-CM   1. Prostate cancer [facility])  [facility] referral to Social Work      HISTORY OF PRESENT ILLNESS: Derek Thompson is a 60 y.o. male with a diagnosis of prostate cancer. He was noted to have an elevated PSA of 39.9 on 05/21/2020, by his primary care physician, Dr. Orma Render.  He has a family history of prostate cancer in his father.  Accordingly, he was referred for evaluation in urology by Dr. Abner Greenspan on 07/16/20,  digital rectal examination was performed at that time revealing palpable nodules bilaterally.  A repeat PSA at the time of this visit had decreased slightly but remained significantly elevated at 33.1.  Therefore, the patient proceeded to transrectal ultrasound with 12 biopsies of the prostate on 08/18/20.  The prostate volume measured 41.64 cc.  Out of 12 core biopsies, all 12 were positive.  The maximum Gleason score was 5+4, and this was seen in all 12 cores with perineural invasion noted in 5 cores.  For disease staging, he underwent PSMA scan on 09/10/20 which confirmed intense activity within the periphery of the prostate gland extending into the seminal vesicles bilaterally as well as three small foci of fairly intense radiotracer activity in the pelvis involving the presacral space and left perirectal fat without discrete lesions.there was no evidence of metastatic adenopathy outside of the pelvis, and no evidence of visceral or skeletal metastasis.  Of note, he also underwent genetic counseling and testing on 09/15/20. The results are still pending.  The patient reviewed the biopsy  results with his urologist and he has kindly been referred today for discussion of potential radiation treatment options.   PREVIOUS RADIATION THERAPY: No  PAST MEDICAL HISTORY:  Past Medical History:  Diagnosis Date   Diabetes mellitus without complication ([place])    Family history of breast cancer    Hypertension    Prostate cancer ([place])       PAST SURGICAL HISTORY: Past Surgical History:  Procedure Laterality Date   RIGHT/LEFT HEART CATH AND CORONARY ANGIOGRAPHY N/A 05/25/2020   Procedure: RIGHT/LEFT HEART CATH AND CORONARY ANGIOGRAPHY;  Surgeon: Martinique, Peter M, MD;  Location: [place] [facility];  Service: Cardiovascular;  Laterality: N/A;    FAMILY HISTORY:  Family History  Problem Relation Age of Onset   Breast cancer Mother        dx 30s/40s, twice   Cancer Maternal Grandfather        unknown type, d. >50    SOCIAL HISTORY:  Social History   Socioeconomic History   Marital status: Divorced    Spouse name: Not on file   Number of children: 3   Years of education: Not on file   Highest education level: Not on file  Occupational History   Not on file  Tobacco Use   Smoking status: Former    Packs/day: 1.00    Years: 43.00    Pack years: 43.00    Types: Cigarettes    Quit date: 05/22/2017    Years since quitting: 3.3   Smokeless tobacco: Never  Vaping Use   Vaping Use: Never  used  Substance and Sexual Activity   Alcohol use: Not Currently    Comment: last drink 2015   Drug use: Not Currently   Sexual activity: Not Currently  Other Topics Concern   Not on file  Social History Narrative   3 sons   Social Determinants of Health   Financial Resource Strain: Not on file  Food Insecurity: Not on file  Transportation Needs: Not on file  Physical Activity: Not on file  Stress: Not on file  Social Connections: Not on file  Intimate Partner Violence: Not on file    ALLERGIES: Patient has no known allergies.  MEDICATIONS:  Current Outpatient Medications   Medication Sig Dispense Refill   Accu-Chek Softclix Lancets lancets SMARTSIG:Topical 1-4 Times Daily     aspirin 81 MG EC tablet TAKE 1 TABLET (81 MG TOTAL) BY MOUTH DAILY. SWALLOW WHOLE. 30 tablet 11   atorvastatin (LIPITOR) 40 MG tablet TAKE 1 TABLET (40 MG TOTAL) BY MOUTH DAILY. 30 tablet 0   B Complex Vitamins (VITAMIN B COMPLEX) TABS Take 1 tablet by mouth daily.     blood glucose meter kit and supplies KIT Dispense based on patient and insurance preference. Use up to four times daily as directed. (FOR ICD-9 250.00, 250.01). 1 each 0   FARXIGA 10 MG TABS tablet TAKE 1 TABLET(10 MG) BY MOUTH DAILY 30 tablet 0   Flaxseed, Linseed, (FLAX SEED OIL PO) Take 1 capsule by mouth daily.     glipiZIDE (GLUCOTROL) 5 MG tablet TAKE 1 TABLET (5 MG TOTAL) BY MOUTH TWO TIMES DAILY. 60 tablet 0   metFORMIN (GLUCOPHAGE) 500 MG tablet Take 1 tablet (500 mg total) by mouth 2 (two) times daily with a meal. 60 tablet 0   metoprolol succinate (TOPROL-XL) 25 MG 24 hr tablet TAKE 1 TABLET(25 MG) BY MOUTH DAILY 90 tablet 3   sacubitril-valsartan (ENTRESTO) 24-26 MG Take 1 tablet by mouth 2 (two) times daily. 180 tablet 1   spironolactone (ALDACTONE) 25 MG tablet TAKE 1/2 TABLET (12.5 MG TOTAL) BY MOUTH DAILY. 15 tablet 0   VITAMIN E PO Take 1 capsule by mouth daily.     tamsulosin (FLOMAX) 0.4 MG CAPS capsule Take 0.4 mg by mouth daily.     No current facility-administered medications for this encounter.    REVIEW OF SYSTEMS:  On review of systems, the patient reports that he is doing well overall. He denies any chest pain, shortness of breath, cough, fevers, chills, night sweats, unintended weight changes. He denies abdominal pain, nausea or vomiting. He reports chronic bowel incontinence that requires him to wear adult diapers daily. He also reports low back pain, as well as generalized weakness of lower extremities but denies paresthesias or focal weakness.  He does have moderate fatigue/decreased stamina but  reports that he has recently changed over to a vegetarian diet and has cut out all meat out of his diet over the past month and has not been supplementing his diet with any protein.  His IPSS score was 7, indicating mild urinary symptoms.  His SHIM score was 7, indicating he has significant erectile dysfunction.  A complete review of systems is obtained and is otherwise negative.    PHYSICAL EXAM:  Wt Readings from Last 3 Encounters:  10/13/20 176 lb 12.8 oz (80.2 kg)  06/10/20 173 lb (78.5 kg)  05/26/20 160 lb 14.4 oz (73 kg)   Temp Readings from Last 3 Encounters:  10/13/20 98.2 F (36.8 C)  05/26/20 98.9 F (37.2  C) (Oral)  05/03/20 99.2 F (37.3 C) (Oral)   BP Readings from Last 3 Encounters:  10/13/20 (!) 157/65  06/10/20 138/76  05/26/20 112/60   Pulse Readings from Last 3 Encounters:  10/13/20 82  06/10/20 (!) 111  05/26/20 92   Pain Assessment Pain Score: 0-No pain (see progress note)/10  In general this is a well appearing African-American male in no acute distress. He's alert and oriented x4 and appropriate throughout the examination. Cardiopulmonary assessment is negative for acute distress, and he exhibits normal effort.     KPS = 90  100 - Normal; no complaints; no evidence of disease. 90   - Able to carry on normal activity; minor signs or symptoms of disease. 80   - Normal activity with effort; some signs or symptoms of disease. 5   - Cares for self; unable to carry on normal activity or to do active work. 60   - Requires occasional assistance, but is able to care for most of his personal needs. 50   - Requires considerable assistance and frequent medical care. 7   - Disabled; requires special care and assistance. 30   - Severely disabled; hospital admission is indicated although death not imminent. 58   - Very sick; hospital admission necessary; active supportive treatment necessary. 10   - Moribund; fatal processes progressing rapidly. 0     -  Dead  Karnofsky DA, Abelmann [place], Craver LS and Burchenal [facility] [phone]) The use of the nitrogen mustards in the palliative treatment of carcinoma: with particular reference to bronchogenic carcinoma Cancer 1 [phone]  LABORATORY DATA:  Lab Results  Component Value Date   WBC 6.8 05/26/2020   HGB 12.2 (L) 05/26/2020   HCT 35.8 (L) 05/26/2020   MCV 86.1 05/26/2020   PLT 360 05/26/2020   Lab Results  Component Value Date   NA 137 07/03/2020   K 4.8 07/03/2020   CL 101 07/03/2020   CO2 23 07/03/2020   Lab Results  Component Value Date   ALT 14 05/23/2020   AST 12 (L) 05/23/2020   ALKPHOS 30 (L) 05/23/2020   BILITOT 0.4 05/23/2020     RADIOGRAPHY: No results found.    IMPRESSION/PLAN: 1. 60 y.o. 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 We discussed the patient's workup and outlined the nature of prostate cancer in this setting. The patient's T stage, Gleason's score, and PSA put him into the high risk group and his disease staging imaging confirms locally advanced disease involving the pelvic lymph nodes. Accordingly, he is eligible for a variety of potential treatment options including prostatectomy or LT-ADT in combination with either 8 weeks of external radiation or 5 weeks of external radiation with an upfront brachytherapy boost. We discussed the available radiation techniques, and focused on the details and logistics of delivery. We discussed and outlined the risks, benefits, short and long-term effects associated with radiotherapy and compared and contrasted these with prostatectomy. We discussed the role of SpaceOAR gel in reducing the rectal toxicity associated with radiotherapy. We also detailed the role of ADT in the treatment of high risk prostate cancer and outlined the associated side effects that could be expected with this therapy.  He was informed of the rationale behind the intentional delay of starting  radiotherapy for approximately 2 months after initiation of ADT, to allow for the radiosensitizing effects of this therapy.  He was encouraged to ask questions that were answered to  his stated satisfaction.  At the conclusion of our conversation, the patient is interested in moving forward with LT-ADT concurrent with brachytherapy boost and use of SpaceOAR gel followed by a 5 week course of daily external beam radiotherapy. We will share our discussion with Dr. Abner Greenspan in hopes that he will be able to start ADT at the time of his upcoming follow-up visit on Tuesday, 10/20/2020.  His CT SIM Pre-Seed planning appointment will be scheduled in the next 6-8 weeks, in anticipation of proceeding with the seed boost in 11/2020, approximately 2 months after the start of ADT. The patient met briefly with Romie Jumper in our office who will be working closely with him to coordinate OR scheduling and pre and post procedure appointments.  We will contact the pharmaceutical rep to ensure that [employer] is available at the time of procedure.  At the time of his procedure follow-up visit, he will have CT SIM of the prostate in preparation to begin his 5-week course of daily external beam radiotherapy approximately 3 weeks after his seed boost procedure.  He appears to have a good understanding of his disease and our treatment recommendations which are of curative intent and he is in agreement with the stated plan.  We personally spent 80 minutes in this encounter including chart review, reviewing radiological studies, meeting face-to-face with the patient, entering orders and completing documentation.    Nicholos Johns, PA-C    Tyler Pita, MD  [facility] Direct Dial: [phone]  Fax: [phone] [email or web address]  Skype  [email or web address]   This document serves as a record of services personally performed by Tyler Pita, MD and Freeman Caldron, PA-C. It was created on their behalf by Wilburn Mylar, a trained medical scribe. The creation of this record is based on the scribe's personal observations and the provider's statements to them. This document has been checked and approved by the attending provider.

## 2020-10-14 ENCOUNTER — Encounter: Payer: Self-pay | Admitting: Licensed Clinical Social Worker

## 2020-10-14 NOTE — Progress Notes (Signed)
[place] Psychosocial Distress Screening Clinical Social Work  Clinical Social Work was referred by distress screening protocol.  The patient scored a 7 on the Psychosocial Distress Thermometer which indicates moderate distress. Clinical Social Worker  attempted to contact patient by phone  to assess for distress and other psychosocial needs. No answer. Left VM with direct contact information.  ONCBCN DISTRESS SCREENING 10/13/2020  Screening Type Initial Screening  Distress experienced in past week (1-10) 7  Practical problem type Housing;Work/school;Food  Emotional problem type Adjusting to illness;Isolation/feeling alone  Spiritual/Religous concerns type Facing my mortality  Physical Problem type Pain;Breathing;Tingling hands/feet;Skin dry/itchy  Physician notified of physical symptoms Yes  Referral to clinical psychology No  Referral to clinical social work Yes  Referral to dietition No  Referral to financial advocate No  Referral to support programs Yes  Referral to palliative care No       Dana, LCSW

## 2020-10-15 ENCOUNTER — Encounter: Payer: Self-pay | Admitting: Genetic Counselor

## 2020-10-15 ENCOUNTER — Ambulatory Visit: Payer: Self-pay | Admitting: Genetic Counselor

## 2020-10-15 ENCOUNTER — Encounter: Payer: Self-pay | Admitting: Licensed Clinical Social Worker

## 2020-10-15 DIAGNOSIS — Z1379 Encounter for other screening for genetic and chromosomal anomalies: Secondary | ICD-10-CM

## 2020-10-15 NOTE — Progress Notes (Signed)
HPI:  Derek Thompson was previously seen in the [facility] due to a personal and family history of cancer and concerns regarding a hereditary predisposition to cancer. Please refer to our prior cancer genetics clinic note for more information regarding our discussion, assessment and recommendations, at the time. Derek Thompson recent genetic test results were disclosed to him, as were recommendations warranted by these results. These results and recommendations are discussed in more detail below.  CANCER HISTORY:  Oncology History  Malignant neoplasm of prostate ([place])  08/18/2020 Cancer Staging   Staging form: Prostate, AJCC 8th Edition - Clinical stage from 08/18/2020: Stage IVA (cT3b, cN1, cM0, PSA: 33.1, Grade Group: 5) - Signed by Freeman Caldron, PA-C on 10/13/2020  Histopathologic type: Adenocarcinoma, NOS  Stage prefix: Initial diagnosis  Prostate specific antigen (PSA) range: 20 or greater  Gleason primary pattern: 5  Gleason secondary pattern: 4  Gleason score: 9  Histologic grading system: 5 grade system  Number of biopsy cores examined: 12  Number of biopsy cores positive: 12  Location of positive needle core biopsies: Both sides    09/15/2020 Initial Diagnosis   Malignant neoplasm of prostate ([facility])     Genetic Testing   Negative genetic testing:  No pathogenic variants detected on the [facility] Common Hereditary Cancers panel + Prostate Cancer HRR panel. The report date is 10/12/2020.   The Common Hereditary Cancers Panel offered by [facility] includes sequencing and/or deletion duplication testing of the following 47 genes: APC, ATM, AXIN2, BARD1, BMPR1A, BRCA1, BRCA2, BRIP1, CDH1, CDK4, CDKN2A (p14ARF), CDKN2A (p16INK4a), CHEK2, CTNNA1, DICER1, EPCAM (Deletion/duplication testing only), GREM1 (promoter region deletion/duplication testing only), KIT, MEN1, MLH1, MSH2, MSH3, MSH6, MUTYH, NBN, NF1, NTHL1, PALB2, PDGFRA, PMS2, POLD1, POLE, PTEN, RAD50, RAD51C,  RAD51D, SDHB, SDHC, SDHD, SMAD4, SMARCA4. STK11, TP53, TSC1, TSC2, and VHL. The following genes were evaluated for sequence changes only: SDHA and HOXB13 c.251G>A variant only. The Prostate Cancer HRR Panel offered by [facility] includes sequencing and/or deletion duplication testing of the following 10 genes: ATM, BARD1, BRCA1, BRCA2, BRIP1, CHEK2, FANCL, PALB2, RAD51C, RAD51D.      FAMILY HISTORY:  We obtained a detailed, 4-generation family history.  Significant diagnoses are listed below: Family History  Problem Relation Age of Onset   Breast cancer Mother        dx 30s/40s, twice   Cancer Maternal Grandfather        unknown type, d. >50   Derek Thompson has three sons (ages 68-33). He has one maternal half-brother (age 66). None of these relatives have had cancer.   Derek Thompson mother is alive at age 70 and has had breast cancer twice, first diagnosed in her 56s or 60s, as well as a third unknown type of cancer. There were 10 or 11 maternal aunts and 10 or 11 maternal uncles. Derek Thompson has limited information about the health history of these family members. Derek Thompson maternal grandmother died at age 43 without cancer. His maternal grandfather died older than 79 with cancer (unknown type).   Derek Thompson does not have any information about his biological father or paternal side of the family.   Derek Thompson is unaware of previous family history of genetic testing for hereditary cancer risks. Patient's maternal ancestors are of [place] and otherwise unknown descent, and paternal ancestors are of unknown descent. There is no reported Ashkenazi Jewish ancestry. There is no known consanguinity.  GENETIC TEST RESULTS: Genetic testing reported out on 10/12/2020 through the [facility] Common Hereditary  Cancers panel + Prostate Cancer HRR panel. No pathogenic variants were detected.   The Common Hereditary Cancers Panel offered by [facility] includes sequencing and/or deletion duplication testing of the  following 47 genes: APC, ATM, AXIN2, BARD1, BMPR1A, BRCA1, BRCA2, BRIP1, CDH1, CDK4, CDKN2A (p14ARF), CDKN2A (p16INK4a), CHEK2, CTNNA1, DICER1, EPCAM (Deletion/duplication testing only), GREM1 (promoter region deletion/duplication testing only), KIT, MEN1, MLH1, MSH2, MSH3, MSH6, MUTYH, NBN, NF1, NTHL1, PALB2, PDGFRA, PMS2, POLD1, POLE, PTEN, RAD50, RAD51C, RAD51D, SDHB, SDHC, SDHD, SMAD4, SMARCA4. STK11, TP53, TSC1, TSC2, and VHL.  The following genes were evaluated for sequence changes only: SDHA and HOXB13 c.251G>A variant only. The Prostate Cancer HRR Panel offered by [facility] includes sequencing and/or deletion duplication testing of the following 10 genes: ATM, BARD1, BRCA1, BRCA2, BRIP1, CHEK2, FANCL, PALB2, RAD51C, RAD51D. The test report will be scanned into EPIC and located under the Molecular Pathology section of the Results Review tab.  A portion of the result report is included below for reference.     We discussed with Derek Thompson that because current genetic testing is not perfect, it is possible there may be a gene mutation in one of these genes that current testing cannot detect, but that chance is small.  We also discussed that there could be another gene that has not yet been discovered, or that we have not yet tested, that is responsible for the cancer diagnoses in the family. It is also possible there is a hereditary cause for the cancer in the family that Derek Thompson did not inherit and therefore was not identified in his testing.  Therefore, it is important to remain in touch with cancer genetics in the future so that we can continue to offer Derek Thompson the most up to date genetic testing.   CANCER SCREENING RECOMMENDATIONS: Derek Thompson test result is considered negative (normal).  This means that we have not identified a hereditary cause for his personal and family history of cancer at this time. Most cancers happen by chance and this negative test suggests that his personal history of  cancer may fall into this category.    While reassuring, this does not definitively rule out a hereditary predisposition to cancer. It is still possible that there could be genetic mutations that are undetectable by current technology. There could be genetic mutations in genes that have not been tested or identified to increase cancer risk. Therefore, it is recommended he continue to follow the cancer management and screening guidelines provided by his oncology and primary healthcare provider.   An individual's cancer risk and medical management are not determined by genetic test results alone. Overall cancer risk assessment incorporates additional factors, including personal medical history, family history, and any available genetic information that may result in a personalized plan for cancer prevention and surveillance.  RECOMMENDATIONS FOR FAMILY MEMBERS:  Individuals in this family might be at some increased risk of developing cancer, over the general population risk, simply due to the family history of cancer.  We recommended women in this family have a yearly mammogram beginning at age 70, or 71 years younger than the earliest onset of cancer, an annual clinical breast exam, and perform monthly breast self-exams. Women in this family should also have a gynecological exam as recommended by their primary provider. All family members should be referred for colonoscopy starting at age 51. Men in the family should discuss prostate cancer screening with their doctors.  It is also possible there is a hereditary cause for the cancer in Mr.  Hinnant's family that he did not inherit and therefore was not identified in him.  Based on Mr. Zimmerle family history, we recommended his mother, who was diagnosed with breast cancer twice in her 38s or 29s, have genetic counseling and testing. Mr. Law will let us know if we can be of any assistance in coordinating genetic counseling and/or testing for this family  member.   FOLLOW-UP: Lastly, we discussed with Mr. Whidbee that cancer genetics is a rapidly advancing field and it is possible that new genetic tests will be appropriate for him and/or his family members in the future. We encouraged him to remain in contact with cancer genetics on an annual basis so we can update his personal and family histories and let him know of advances in cancer genetics that may benefit this family.   Our contact number was provided. Derek Thompson questions were answered to his satisfaction, and he knows he is welcome to call us at anytime with additional questions or concerns.   Clint Guy, MS, [facility] Genetic Counselor [email or web address] Phone: [phone]

## 2020-10-15 NOTE — Progress Notes (Signed)
[place] Psychosocial Distress Screening Clinical Social Work  [facility] representative made second attempt to contact patient by phone to assess for distress and other psychosocial needs.  No answer. Left VM with direct contact information.    CSW may be reconsulted as needed in the future.    Darcee Dekker E Charmin Aguiniga, LCSW

## 2020-10-15 NOTE — Telephone Encounter (Signed)
LVM that his genetic test results are available and requested that he call back to discuss them.  

## 2020-10-15 NOTE — Telephone Encounter (Signed)
Revealed negative genetic testing. Discussed that we do not know why he has prostate cancer or why there is cancer in the family. There could be a genetic mutation in the family that Derek Thompson did not inherit. There could also be a mutation in a different gene that we are not testing, or our current technology may not be able to detect certain mutations. It will therefore be important for him to stay in contact with genetics to keep up with whether additional testing may be appropriate in the future.

## 2020-10-16 ENCOUNTER — Telehealth: Payer: Self-pay | Admitting: *Deleted

## 2020-10-16 NOTE — Telephone Encounter (Signed)
Called patient to update, lvm for a return call 

## 2020-11-25 ENCOUNTER — Other Ambulatory Visit: Payer: Self-pay | Admitting: Urology

## 2020-11-25 DIAGNOSIS — C61 Malignant neoplasm of prostate: Secondary | ICD-10-CM

## 2020-11-30 ENCOUNTER — Telehealth: Payer: Self-pay | Admitting: *Deleted

## 2020-11-30 NOTE — Telephone Encounter (Signed)
CALLED PATIENT TO INFORM OF PRE-SEED APPTS. AND IMPLANT DATE, LVM FOR A RETURN CALL 

## 2020-12-01 ENCOUNTER — Telehealth: Payer: Self-pay | Admitting: *Deleted

## 2020-12-01 NOTE — Telephone Encounter (Signed)
Returned patient's phone call, spoke with patient 

## 2020-12-01 NOTE — Progress Notes (Addendum)
Left message with connie at [facility] urology pt has ef of 25 % and must be done at [facility] or per surgery center guidelines and pt needs cardiac clearance for 02-01-2021 surgery.

## 2020-12-01 NOTE — Telephone Encounter (Signed)
Called patient to remind of pre-seed appts. for 12-03-20 and his implant date, lvm for a return call

## 2020-12-02 ENCOUNTER — Telehealth: Payer: Self-pay | Admitting: Internal Medicine

## 2020-12-02 NOTE — Telephone Encounter (Signed)
   [facility] Pre-operative Risk Assessment    Patient Name: Derek Thompson  DOB: 01-05-1961 MRN: [id number]  HEARTCARE STAFF:  - IMPORTANT!!!!!! Under Visit Info/Reason for Call, type in Other and utilize the format Clearance MM/DD/YY or Clearance TBD. Do not use dashes or single digits. - Please review there is not already an duplicate clearance open for this procedure. - If request is for dental extraction, please clarify the # of teeth to be extracted. - If the patient is currently at the dentist's office, call Pre-Op Callback Staff (MA/nurse) to input urgent request.  - If the patient is not currently in the dentist office, please route to the Pre-Op pool.  Request for surgical clearance:  What type of surgery is being performed? Radioactive seed implant with space OAR  When is this surgery scheduled? 02-01-21  What type of clearance is required (medical clearance vs. Pharmacy clearance to hold med vs. Both)? both  Are there any medications that need to be held prior to surgery and how long? Aspirin 81 mg need to be held 5 days prior to surgery  Practice name and name of physician performing surgery? Dr Rexene Alberts  What is the office phone number? [phone]   7.   What is the office fax number? [phone]  8.   Anesthesia type (None, local, MAC, general) ? General   Glyn Ade 12/02/2020, 10:53 AM  _________________________________________________________________   (provider comments below)

## 2020-12-02 NOTE — Telephone Encounter (Signed)
   Name: Derek Thompson  DOB: 1960/06/25  MRN: [id number]  Primary Cardiologist: Pixie Casino, MD  Chart reviewed as part of pre-operative protocol coverage. Because of Orvile Obriant's past medical history and time since last visit, he will require a follow-up visit in order to better assess preoperative cardiovascular risk.  Pre-op covering staff: - Please schedule appointment and call patient to inform them. If patient already had an upcoming appointment within acceptable timeframe, please add "pre-op clearance" to the appointment notes so provider is aware. - Please contact requesting surgeon's office via preferred method (i.e, phone, fax) to inform them of need for appointment prior to surgery.  If applicable, this message will also be routed to pharmacy pool and/or primary cardiologist for input on holding anticoagulant/antiplatelet agent as requested below so that this information is available to the clearing provider at time of patient's appointment.   Overall low risk procedure, however patient was admitted earlier this year for CHF, during the last visit, his heart rate was still elevated at 111.  He was supposed to follow-up near the end of February, this never happened.  Overall, I feel the patient will need a reassessment and make sure he is stable from heart failure perspective before he can proceed above urology procedure.  [place], [place]  12/02/2020, 12:19 PM

## 2020-12-02 NOTE — Telephone Encounter (Signed)
Left message for pt to call the office to schedule an appt for pre op clearance.

## 2020-12-03 ENCOUNTER — Encounter (HOSPITAL_COMMUNITY)
Admission: RE | Admit: 2020-12-03 | Discharge: 2020-12-03 | Disposition: A | Discharge status: Home / Self Care | Payer: BC Managed Care – PPO | Source: Ambulatory Visit | Attending: Urology | Admitting: Urology

## 2020-12-03 ENCOUNTER — Ambulatory Visit (HOSPITAL_COMMUNITY)
Admission: RE | Admit: 2020-12-03 | Discharge: 2020-12-03 | Disposition: A | Discharge status: Home / Self Care | Payer: BC Managed Care – PPO | Source: Ambulatory Visit | Attending: Urology | Admitting: Urology

## 2020-12-03 ENCOUNTER — Encounter: Payer: Self-pay | Admitting: Urology

## 2020-12-03 ENCOUNTER — Ambulatory Visit
Admission: RE | Admit: 2020-12-03 | Discharge: 2020-12-03 | Disposition: A | Discharge status: Home / Self Care | Payer: BC Managed Care – PPO | Source: Ambulatory Visit | Attending: Radiation Oncology | Admitting: Radiation Oncology

## 2020-12-03 ENCOUNTER — Other Ambulatory Visit: Payer: Self-pay

## 2020-12-03 ENCOUNTER — Ambulatory Visit
Admission: RE | Admit: 2020-12-03 | Discharge: 2020-12-03 | Disposition: A | Discharge status: Home / Self Care | Payer: BC Managed Care – PPO | Source: Ambulatory Visit | Attending: Urology | Admitting: Urology

## 2020-12-03 DIAGNOSIS — Z510 Encounter for antineoplastic radiation therapy: Secondary | ICD-10-CM | POA: Diagnosis not present

## 2020-12-03 DIAGNOSIS — C61 Malignant neoplasm of prostate: Secondary | ICD-10-CM

## 2020-12-03 NOTE — Telephone Encounter (Signed)
2nd attempt to reach pt regarding surgical clearance and the need for an appointment. Left pt a detailed message to call back.  If pt calls, please schedule him an appointment.

## 2020-12-03 NOTE — Progress Notes (Signed)
  Radiation Oncology         [phone]) [phone] ________________________________  Name: Derek Thompson MRN: [id number]  Date: 12/03/2020  DOB: [birth year]  SIMULATION AND TREATMENT PLANNING NOTE PUBIC ARCH STUDY  KG:[id number], [name], MD  Benito Mccreedy, MD  DIAGNOSIS: 60 y.o. 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  Oncology History  Malignant neoplasm of prostate ([place])  08/18/2020 Cancer Staging   Staging form: Prostate, AJCC 8th Edition - Clinical stage from 08/18/2020: Stage IVA (cT3b, cN1, cM0, PSA: 33.1, Grade Group: 5) - Signed by Freeman Caldron, PA-C on 10/13/2020  Histopathologic type: Adenocarcinoma, NOS  Stage prefix: Initial diagnosis  Prostate specific antigen (PSA) range: 20 or greater  Gleason primary pattern: 5  Gleason secondary pattern: 4  Gleason score: 9  Histologic grading system: 5 grade system  Number of biopsy cores examined: 12  Number of biopsy cores positive: 12  Location of positive needle core biopsies: Both sides    09/15/2020 Initial Diagnosis   Malignant neoplasm of prostate ([facility])     Genetic Testing   Negative genetic testing:  No pathogenic variants detected on the [facility] Common Hereditary Cancers panel + Prostate Cancer HRR panel. The report date is 10/12/2020.   The Common Hereditary Cancers Panel offered by [facility] includes sequencing and/or deletion duplication testing of the following 47 genes: APC, ATM, AXIN2, BARD1, BMPR1A, BRCA1, BRCA2, BRIP1, CDH1, CDK4, CDKN2A (p14ARF), CDKN2A (p16INK4a), CHEK2, CTNNA1, DICER1, EPCAM (Deletion/duplication testing only), GREM1 (promoter region deletion/duplication testing only), KIT, MEN1, MLH1, MSH2, MSH3, MSH6, MUTYH, NBN, NF1, NTHL1, PALB2, PDGFRA, PMS2, POLD1, POLE, PTEN, RAD50, RAD51C, RAD51D, SDHB, SDHC, SDHD, SMAD4, SMARCA4. STK11, TP53, TSC1, TSC2, and VHL. The following genes were evaluated for sequence changes only: SDHA and  HOXB13 c.251G>A variant only. The Prostate Cancer HRR Panel offered by [facility] includes sequencing and/or deletion duplication testing of the following 10 genes: ATM, BARD1, BRCA1, BRCA2, BRIP1, CHEK2, FANCL, PALB2, RAD51C, RAD51D.        ICD-10-CM   1. Malignant neoplasm of prostate ([place])  C61       COMPLEX SIMULATION:  The patient presented today for evaluation for possible prostate seed implant. He was brought to the radiation planning suite and placed supine on the CT couch. A 3-dimensional image study set was obtained in upload to the planning computer. There, on each axial slice, I contoured the prostate gland. Then, using three-dimensional radiation planning tools I reconstructed the prostate in view of the structures from the transperineal needle pathway to assess for possible pubic arch interference. In doing so, I did not appreciate any pubic arch interference. Also, the patient's prostate volume was estimated based on the drawn structure. The volume was 43 cc.  Given the pubic arch appearance and prostate volume, patient remains a good candidate to proceed with prostate seed implant. Today, he freely provided informed written consent to proceed.    PLAN: The patient will undergo prostate seed implant for boost followed by 5.5 weeks of prostate IMRT.   ________________________________  Sheral Apley. Tammi Klippel, M.D.

## 2020-12-04 NOTE — Telephone Encounter (Signed)
3 rd and final attempt to reach pt regarding surgical clearance and the need to make an appointment. Left another detailed message for pt to call back. Will route back to the requesting surgeon's office to make them aware we cannot reach pt in order to schedule and will close this encounter.   If pt calls, please schedule 1st available with Dr. Debara Pickett or APP/NP.

## 2020-12-08 ENCOUNTER — Encounter (HOSPITAL_COMMUNITY): Payer: Self-pay | Admitting: Emergency Medicine

## 2020-12-08 ENCOUNTER — Emergency Department (HOSPITAL_COMMUNITY)
Admission: EM | Admit: 2020-12-08 | Discharge: 2020-12-08 | Disposition: A | Discharge status: Home / Self Care | Payer: No Typology Code available for payment source | Attending: Emergency Medicine | Admitting: Emergency Medicine

## 2020-12-08 ENCOUNTER — Emergency Department (HOSPITAL_COMMUNITY): Payer: No Typology Code available for payment source

## 2020-12-08 ENCOUNTER — Other Ambulatory Visit: Payer: Self-pay

## 2020-12-08 DIAGNOSIS — E119 Type 2 diabetes mellitus without complications: Secondary | ICD-10-CM | POA: Insufficient documentation

## 2020-12-08 DIAGNOSIS — I5021 Acute systolic (congestive) heart failure: Secondary | ICD-10-CM | POA: Insufficient documentation

## 2020-12-08 DIAGNOSIS — Z87891 Personal history of nicotine dependence: Secondary | ICD-10-CM | POA: Diagnosis not present

## 2020-12-08 DIAGNOSIS — S8001XA Contusion of right knee, initial encounter: Secondary | ICD-10-CM | POA: Insufficient documentation

## 2020-12-08 DIAGNOSIS — I2510 Atherosclerotic heart disease of native coronary artery without angina pectoris: Secondary | ICD-10-CM | POA: Insufficient documentation

## 2020-12-08 DIAGNOSIS — Z79899 Other long term (current) drug therapy: Secondary | ICD-10-CM | POA: Insufficient documentation

## 2020-12-08 DIAGNOSIS — S8000XA Contusion of unspecified knee, initial encounter: Secondary | ICD-10-CM

## 2020-12-08 DIAGNOSIS — Z8546 Personal history of malignant neoplasm of prostate: Secondary | ICD-10-CM | POA: Insufficient documentation

## 2020-12-08 DIAGNOSIS — W19XXXA Unspecified fall, initial encounter: Secondary | ICD-10-CM | POA: Insufficient documentation

## 2020-12-08 DIAGNOSIS — Z7984 Long term (current) use of oral hypoglycemic drugs: Secondary | ICD-10-CM | POA: Insufficient documentation

## 2020-12-08 DIAGNOSIS — I110 Hypertensive heart disease with heart failure: Secondary | ICD-10-CM | POA: Insufficient documentation

## 2020-12-08 DIAGNOSIS — Y990 Civilian activity done for income or pay: Secondary | ICD-10-CM | POA: Diagnosis not present

## 2020-12-08 DIAGNOSIS — S8991XA Unspecified injury of right lower leg, initial encounter: Secondary | ICD-10-CM | POA: Diagnosis present

## 2020-12-08 DIAGNOSIS — Z7982 Long term (current) use of aspirin: Secondary | ICD-10-CM | POA: Diagnosis not present

## 2020-12-08 DIAGNOSIS — M25562 Pain in left knee: Secondary | ICD-10-CM | POA: Insufficient documentation

## 2020-12-08 MED ORDER — DICLOFENAC SODIUM 1 % EX GEL: 2.0000 g | Freq: Four times a day (QID) | CUTANEOUS | 0 refills | Status: DC

## 2020-12-08 MED ORDER — IBUPROFEN 200 MG PO TABS
600.0000 mg | ORAL_TABLET | Freq: Once | ORAL | Status: AC
  Administered 2020-12-08: 600 mg via ORAL
  Filled 2020-12-08: qty 3

## 2020-12-08 MED ORDER — ACETAMINOPHEN 325 MG PO TABS
650.0000 mg | ORAL_TABLET | Freq: Once | ORAL | Status: AC
  Administered 2020-12-08: 650 mg via ORAL
  Filled 2020-12-08: qty 2

## 2020-12-08 NOTE — ED Notes (Signed)
Patient in radiology

## 2020-12-08 NOTE — Discharge Instructions (Addendum)
Use of Voltaren gel as needed. Follow-up with your primary care provider. Return to the ER for worsening pain, swelling, additional injuries, numbness.

## 2020-12-08 NOTE — ED Triage Notes (Addendum)
Patient arrives via EMS from his work place- patient was pushing a work cart- became entangled with cart, fell.  Patient co having pain in bilateral knees, reports left knee hurts the worse.  Patient denies hitting his head.  Denies being on blood thinners Currently receiving chemo for prostate cancer.

## 2020-12-08 NOTE — ED Provider Notes (Signed)
[facility] DEPT Provider Note   CSN: [id number] Arrival date & time: 12/08/20  9381     History Chief Complaint  Patient presents with   Derek Thompson is a 60 y.o. male with a past medical history of diabetes, hypertension, prostate cancer, CHF presenting to the ED with a chief complaint of bilateral knee pain.  States that he was pushing a cart at work when his left foot got entangled in something and he fell forward onto both of his knees.  He denies any head injury, loss of consciousness or other injuries.  He has not ambulated since then secondary to pain.  Pain is worse in his left knee.  No prior fracture, dislocations or procedures in the area.  Denies any numbness, vision changes or upper extremity pain.  HPI     Past Medical History:  Diagnosis Date   Diabetes mellitus without complication ([place])    Family history of breast cancer    Hypertension    Prostate cancer [facility])     Patient Active Problem List   Diagnosis Date Noted   Genetic testing 10/12/2020   Malignant neoplasm of prostate ([place])    Family history of breast cancer    CAD (coronary artery disease) 06/10/2020   Hypertension 06/10/2020   Dyslipidemia 06/10/2020   Type 2 diabetes mellitus with complication, without long-term current use of insulin ([place]) 01/75/1025   Acute systolic CHF (congestive heart failure), NYHA class 3 ([place])    Pulmonary edema cardiac cause ([place]) 05/22/2020    Past Surgical History:  Procedure Laterality Date   RIGHT/LEFT HEART CATH AND CORONARY ANGIOGRAPHY N/A 05/25/2020   Procedure: RIGHT/LEFT HEART CATH AND CORONARY ANGIOGRAPHY;  Surgeon: Martinique, Peter M, MD;  Location: [facility];  Service: Cardiovascular;  Laterality: N/A;       Family History  Problem Relation Age of Onset   Breast cancer Mother        dx 30s/40s, twice   Cancer Maternal Grandfather        unknown type, d. >50    Social History   Tobacco Use   Smoking  status: Former    Packs/day: 1.00    Years: 43.00    Pack years: 43.00    Types: Cigarettes    Quit date: 05/22/2017    Years since quitting: 3.5   Smokeless tobacco: Never  Vaping Use   Vaping Use: Never used  Substance Use Topics   Alcohol use: Not Currently    Comment: last drink 2015   Drug use: Not Currently    Home Medications Prior to Admission medications   Medication Sig Start Date End Date Taking? Authorizing Provider  acetaminophen (TYLENOL) 500 MG tablet Take 1,000 mg by mouth every 6 (six) hours as needed for mild pain.   Yes [provider]  aspirin 81 MG EC tablet TAKE 1 TABLET (81 MG TOTAL) BY MOUTH DAILY. SWALLOW WHOLE. Patient taking differently: Take 81 mg by mouth daily. 05/26/20 05/26/21 Yes Swayze, [place], DO  atorvastatin (LIPITOR) 40 MG tablet TAKE 1 TABLET (40 MG TOTAL) BY MOUTH DAILY. Patient taking differently: Take 40 mg by mouth daily. 05/26/20 05/26/21 Yes Swayze, [place], DO  diclofenac Sodium (VOLTAREN) 1 % GEL Apply 2 g topically 4 (four) times daily. 12/08/20  Yes Araeya Lamb, PA-C  etodolac (LODINE) 500 MG tablet Take 500 mg by mouth 2 (two) times daily. 12/04/20  Yes [provider]  FARXIGA 10 MG TABS tablet TAKE  1 TABLET(10 MG) BY MOUTH DAILY Patient taking differently: Take 10 mg by mouth daily. 09/11/20  Yes Hilty, Lisette Abu, MD  glipiZIDE (GLUCOTROL) 5 MG tablet TAKE 1 TABLET (5 MG TOTAL) BY MOUTH TWO TIMES DAILY. Patient taking differently: Take 5 mg by mouth in the morning and at bedtime. 05/26/20 05/26/21 Yes Swayze, [place]va, DO  metFORMIN (GLUCOPHAGE) 500 MG tablet Take 1 tablet (500 mg total) by mouth 2 (two) times daily with a meal. 05/03/20  Yes Ray, Duwayne Heck, MD  metoprolol succinate (TOPROL-XL) 25 MG 24 hr tablet TAKE 1 TABLET(25 MG) BY MOUTH DAILY Patient taking differently: Take 25 mg by mouth daily. 10/12/20  Yes Hilty, Lisette Abu, MD  Multiple Vitamin (MULTIVITAMIN) tablet Take 1 tablet by mouth daily.   Yes [provider]   sacubitril-valsartan (ENTRESTO) 24-26 MG Take 1 tablet by mouth 2 (two) times daily. 08/21/20  Yes Lennette Bihari, MD  spironolactone (ALDACTONE) 25 MG tablet TAKE 1/2 TABLET (12.5 MG TOTAL) BY MOUTH DAILY. Patient taking differently: Take 12.5 mg by mouth daily. 05/26/20 05/26/21 Yes Swayze, [facility]va, DO  tamsulosin (FLOMAX) 0.4 MG CAPS capsule Take 0.4 mg by mouth at bedtime. 10/10/20  Yes [provider]  Accu-Chek Softclix Lancets lancets SMARTSIG:Topical 1-4 Times Daily 05/27/20   [provider]  blood glucose meter kit and supplies KIT Dispense based on patient and insurance preference. Use up to four times daily as directed. (FOR ICD-9 250.00, 250.01). 05/26/20   Swayze, Ava, DO    Allergies    Patient has no known allergies.  Review of Systems   Review of Systems  Constitutional:  Negative for appetite change, chills and fever.  HENT:  Negative for ear pain, rhinorrhea, sneezing and sore throat.   Eyes:  Negative for photophobia and visual disturbance.  Respiratory:  Negative for cough, chest tightness, shortness of breath and wheezing.   Cardiovascular:  Negative for chest pain and palpitations.  Gastrointestinal:  Negative for abdominal pain, blood in stool, constipation, diarrhea, nausea and vomiting.  Genitourinary:  Negative for dysuria, hematuria and urgency.  Musculoskeletal:  Positive for arthralgias. Negative for myalgias.  Skin:  Negative for rash.  Neurological:  Negative for dizziness, weakness and light-headedness.   Physical Exam Updated Vital Signs BP (!) 151/71 (BP Location: Right Arm)   Pulse 78   Temp 98.3 F (36.8 C) (Oral)   Resp 18   Ht 6' (1.829 m)   Wt 82.6 kg   SpO2 100%   BMI 24.68 kg/m   Physical Exam Vitals and nursing note reviewed.  Constitutional:      General: He is not in acute distress.    Appearance: He is well-developed.  HENT:     Head: Normocephalic and atraumatic.     Nose: Nose normal.  Eyes:     General: No  scleral icterus.       Left eye: No discharge.     Conjunctiva/sclera: Conjunctivae normal.  Cardiovascular:     Rate and Rhythm: Normal rate and regular rhythm.     Heart sounds: Normal heart sounds. No murmur heard.   No friction rub. No gallop.  Pulmonary:     Effort: Pulmonary effort is normal. No respiratory distress.     Breath sounds: Normal breath sounds.  Abdominal:     General: Bowel sounds are normal. There is no distension.     Palpations: Abdomen is soft.     Tenderness: There is no abdominal tenderness. There is no guarding.  Musculoskeletal:  General: Tenderness present. Normal range of motion.     Cervical back: Normal range of motion and neck supple.     Comments: Tenderness to palpation of bilateral patella without overlying skin changes or deformities.  2+ DP pulse palpated.  Pain with flexion of the left knee to 45 degrees.  Able to perform range of motion bilaterally.  Skin:    General: Skin is warm and dry.     Findings: No rash.  Neurological:     Mental Status: He is alert.     Motor: No abnormal muscle tone.     Coordination: Coordination normal.    ED Results / Procedures / Treatments   Labs (all labs ordered are listed, but only abnormal results are displayed) Labs Reviewed - No data to display  EKG None  Radiology DG Knee Complete 4 Views Left  Result Date: 12/08/2020 CLINICAL DATA:  Fall, pain bilateral knees EXAM: LEFT KNEE - COMPLETE 4+ VIEW COMPARISON:  Same day contralateral knee radiographs FINDINGS: There is no acute fracture or dislocation. Alignment is normal. There is mild medial and lateral compartment joint space narrowing. There is mild superior patellar enthesopathy. There is no effusion. The soft tissues are unremarkable. IMPRESSION: No acute fracture or dislocation. Electronically Signed   By: Valetta Mole MD   On: 12/08/2020 10:03   DG Knee Complete 4 Views Right  Result Date: 12/08/2020 CLINICAL DATA:  Bilateral knee pain  after fall. EXAM: RIGHT KNEE - COMPLETE 4+ VIEW COMPARISON:  None. FINDINGS: No evidence of fracture, dislocation, or joint effusion. No evidence of arthropathy or other focal bone abnormality. Soft tissues are unremarkable. IMPRESSION: Negative. Electronically Signed   By: Marijo Conception M.D.   On: 12/08/2020 10:03    Procedures Procedures   Medications Ordered in ED Medications  acetaminophen (TYLENOL) tablet 650 mg (has no administration in time range)  ibuprofen (ADVIL) tablet 600 mg (has no administration in time range)    ED Course  I have reviewed the triage vital signs and the nursing notes.  Pertinent labs & imaging results that were available during my care of the patient were reviewed by me and considered in my medical decision making (see chart for details).    MDM Rules/Calculators/A&P                           60 year old male presenting to the ED after mechanical fall at work.  His left foot got entangled in something while he was pushing a cart and he fell forward onto both of his knees.  Has not ambulated since then.  Reports left knee pain is worse than the right.  On exam there is bilateral tenderness.  No deformities or overlying skin changes.  Areas are vastly intact.  He reports pain with left knee flexion but able to perform range of motion.  Will obtain imaging and reassess.  Bilateral knee x-rays are negative for acute abnormality.  Suspect symptoms due to chronic contusion.  Will treat symptomatically.  He is ambulatory here.  Encouraged PCP follow-up. Return precautions given.   Patient is hemodynamically stable, in NAD, and able to ambulate in the ED. Evaluation does not show pathology that would require ongoing emergent intervention or inpatient treatment. I explained the diagnosis to the patient. Pain has been managed and has no complaints prior to discharge. Patient is comfortable with above plan and is stable for discharge at this time. All questions were  answered  prior to disposition. Strict return precautions for returning to the ED were discussed. Encouraged follow up with PCP.   An After Visit Summary was printed and given to the patient.   Portions of this note were generated with [employer]. Dictation errors may occur despite best attempts at proofreading.  Final Clinical Impression(s) / ED Diagnoses Final diagnoses:  Fall, initial encounter  Contusion of knee, unspecified laterality, initial encounter    Rx / DC Orders ED Discharge Orders          Ordered    diclofenac Sodium (VOLTAREN) 1 % GEL  4 times daily        12/08/20 [address], PA-C 12/08/20 1032    Milton Ferguson, MD 12/13/20 1008

## 2020-12-08 NOTE — ED Notes (Signed)
Ambulates without difficulty.

## 2021-01-01 ENCOUNTER — Telehealth: Payer: Self-pay | Admitting: *Deleted

## 2021-01-01 NOTE — Telephone Encounter (Signed)
01/01/2021: Message left regarding [employer] and [employer] Short-term disability forms ready for pick-up today by 4:30 pm or upon Holiday re-open next Tuesday, 01/05/2021.  12/24/2020: Forms received for "D. Kozak without [place] FMLA/Disability Cover Sheet, patient identifier or provider.  12/29/2020: Returned to Radiation to identify for processing. 12/30/2020: Nursing secretary identified patient. 12/30/2020: This nurse connected with patient.  Derek Thompson unexpectedly appeared in main entry lobby to check status of forms.  Completed and signed employee section of [employer] short-term disability form.  At this time reports "Fall/Tripped at work" this month.

## 2021-01-06 NOTE — Progress Notes (Signed)
DUE TO COVID-19 ONLY ONE VISITOR IS ALLOWED TO COME WITH YOU AND STAY IN THE WAITING ROOM ONLY DURING PRE OP AND PROCEDURE DAY OF SURGERY. THE 1 VISITOR  MAY VISIT WITH YOU AFTER SURGERY IN YOUR PRIVATE ROOM DURING VISITING HOURS ONLY!  YOU NEED TO HAVE A COVID 19 TEST ON___9/29/22 ____ '@_______'$ , THIS TEST MUST BE DONE BEFORE SURGERY,  COVID TESTING SITE IS AT [facility]. PLEASE REMAIN IN YOUR CAR THIS IS A DRIVER UP TEST. AFTER YOUR COVID TEST PLEASE WEAR A MASK OUT IN PUBLIC AND SOCIAL DISTANCE AND [place] YOUR HANDS FREQUENTLY. PLEASE ASK ALL YOUR CLOSE CONTACTS TO WEAR A MASK OUT IN PUBLIC AND SOCIAL DISTANCE AND [place] HANDS FREQUENTLY ALSO.               Dang Corpus  01/06/2021   Your procedure is scheduled on:  02/01/2021   Report to [facility] Main  Entrance   Report to admitting at    [phone]     Call this number if you have problems the morning of surgery [phone]    Remember: Do not eat food , candy gum or mints :After Midnight. You may have clear liquids from midnight until __ 0900am   Fleets enema am of surgery  CLEAR LIQUID DIET   Foods Allowed                                                                       Coffee and tea, regular and decaf                              Plain Jell-O any favor except red or purple                                            Fruit ices (not with fruit pulp)                                      Iced Popsicles                                     Carbonated beverages, regular and diet                                    Cranberry, grape and apple juices Sports drinks like Gatorade Lightly seasoned clear broth or consume(fat free) Sugar   _____________________________________________________________________    BRUSH YOUR TEETH MORNING OF SURGERY AND RINSE YOUR MOUTH OUT, NO CHEWING GUM CANDY OR MINTS.     Take these medicines the morning of surgery with A SIP OF WATER:    toprol   DO NOT TAKE ANY  DIABETIC MEDICATIONS DAY OF YOUR SURGERY  You may not have any metal on your body including hair pins and              piercings  Do not wear jewelry, make-up, lotions, powders or perfumes, deodorant             Do not wear nail polish on your fingernails.  Do not shave  48 hours prior to surgery.              Men may shave face and neck.   Do not bring valuables to the hospital. [place].  Contacts, dentures or bridgework may not be worn into surgery.  Leave suitcase in the car. After surgery it may be brought to your room.     Patients discharged the day of surgery will not be allowed to drive home. IF YOU ARE HAVING SURGERY AND GOING HOME THE SAME DAY, YOU MUST HAVE AN ADULT TO DRIVE YOU HOME AND BE WITH YOU FOR 24 HOURS. YOU MAY GO HOME BY TAXI OR UBER OR ORTHERWISE, BUT AN ADULT MUST ACCOMPANY YOU HOME AND STAY WITH YOU FOR 24 HOURS.  Name and phone number of your driver:  Special Instructions: N/A              Please read over the following fact sheets you were given: _____________________________________________________________________  [facility] - Preparing for Surgery Before surgery, you can play an important role.  Because skin is not sterile, your skin needs to be as free of germs as possible.  You can reduce the number of germs on your skin by washing with CHG (chlorahexidine gluconate) soap before surgery.  CHG is an antiseptic cleaner which kills germs and bonds with the skin to continue killing germs even after washing. Please DO NOT use if you have an allergy to CHG or antibacterial soaps.  If your skin becomes reddened/irritated stop using the CHG and inform your nurse when you arrive at Short Stay. Do not shave (including legs and underarms) for at least 48 hours prior to the first CHG shower.  You may shave your face/neck. Please follow these instructions carefully:  1.  Shower with CHG Soap  the night before surgery and the  morning of Surgery.  2.  If you choose to wash your hair, wash your hair first as usual with your  normal  shampoo.  3.  After you shampoo, rinse your hair and body thoroughly to remove the  shampoo.                           4.  Use CHG as you would any other liquid soap.  You can apply chg directly  to the skin and wash                       Gently with a scrungie or clean washcloth.  5.  Apply the CHG Soap to your body ONLY FROM THE NECK DOWN.   Do not use on face/ open                           Wound or open sores. Avoid contact with eyes, ears mouth and genitals (private parts).  Wash face,  Genitals (private parts) with your normal soap.             6.  Wash thoroughly, paying special attention to the area where your surgery  will be performed.  7.  Thoroughly rinse your body with warm water from the neck down.  8.  DO NOT shower/wash with your normal soap after using and rinsing off  the CHG Soap.                9.  Pat yourself dry with a clean towel.            10.  Wear clean pajamas.            11.  Place clean sheets on your bed the night of your first shower and do not  sleep with pets. Day of Surgery : Do not apply any lotions/deodorants the morning of surgery.  Please wear clean clothes to the hospital/surgery center.  FAILURE TO FOLLOW THESE INSTRUCTIONS MAY RESULT IN THE CANCELLATION OF YOUR SURGERY PATIENT SIGNATURE_________________________________  NURSE SIGNATURE__________________________________  ________________________________________________________________________

## 2021-01-07 NOTE — Progress Notes (Signed)
Cardiology Clinic Note   Patient Name: Derek Thompson Date of Encounter: 01/08/2021  Primary Care Provider:  Benito Mccreedy, MD Primary Cardiologist:  Derek Casino, MD  Patient Profile    Derek Thompson 60 year old male presents the clinic today for follow-up evaluation of his acute systolic CHF, CAD, and preoperative cardiac evaluation.  Past Medical History    Past Medical History:  Diagnosis Date   Diabetes mellitus without complication ([place])    Family history of breast cancer    Hypertension    Prostate cancer [facility])    Past Surgical History:  Procedure Laterality Date   RIGHT/LEFT HEART CATH AND CORONARY ANGIOGRAPHY N/A 05/25/2020   Procedure: RIGHT/LEFT HEART CATH AND CORONARY ANGIOGRAPHY;  Surgeon: Thompson, Derek M, MD;  Location: [facility];  Service: Cardiovascular;  Laterality: N/A;    Allergies  No Known Allergies  History of Present Illness    Derek Thompson is a PMH of pulmonary edema, acute systolic CHF with an EF of 25-30%, coronary artery disease, HTN, type 2 diabetes, prostate cancer, HLD, and family history of breast cancer.  He underwent cardiac catheterization 05/25/2020 and was found to have a single-vessel obstructive coronary artery involving a ramus intermediate branch.  He was admitted to the hospital 1/21 - 05/26/2020 with acute systolic CHF.  He presented with increasing short of breath and orthopnea.  He also noted a syncopal episode.  He was noted to be hypoxic in the emergency department and required BiPAP.  His chest CTA was negative for PE but significant for bilateral pleural effusions.  His echocardiogram showed an LVEF of 25-30% with akinesis of the mid inferior septal wall and anterior septal wall.  He received IV diuresis and underwent left and right cardiac catheterization which showed 80% stenosis of his ramus intermediate branch with nonobstructive coronary disease elsewhere.  Medical management was recommended.  He was started on  spironolactone and [place].  He was also started on metoprolol succinate 25 mg daily.  He was unable to be placed on ACE ARB or Arni due to soft blood pressure.  He was noted to have SVT with aberrancy during admission but had no reoccurrence after metoprolol was initiated.  He also was placed on aspirin and statin.  He was seen in follow-up by Derek Thompson on 06/10/2020.  He reported that he had done well since discharge.  His weight remained stable.  He did notice some increased work of breathing with increased physical activity.  He reported that his breathing was significantly better than it was prior to his hospital admission.  He denied orthopnea and PND.  He denied lower extremity swelling and chest discomfort.  He had no significant palpitations lightheadedness or dizziness.  He denied recurrent syncopal episodes.  He reported compliance with his medications and was tolerating them well without side effects.  It was planned that his medication would be uptitrated and he would repeat his echocardiogram after medication optimization.  His echocardiogram was not repeated.  His follow-up lab work 07/03/2020 showed stable renal function and elevated glucose at.  He was instructed to check his blood sugar regularly.  Presents the clinic today for follow-up evaluation states he is taking hormone treatment for his prostate cancer.  He reports hot flashes and fatigue with his therapy.  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  He would like to increase his physical activity outside of work.  He feels he has recovered fairly well since  being in the hospital.  He reports compliance with his Entresto medication but notes that it is expensive.  His blood pressure is well controlled at home and he reports that he continues to try to eat a low-sodium diet he is now vegetarian.  I will order a echocardiogram, maintain his 24-26 dosing of Entresto at this time, have him increase  his physical activity as tolerated, and maintain his diet.  We will have him follow-up in 1-2 months.  Today he denies chest pain, shortness of breath, lower extremity edema, fatigue, palpitations, melena, hematuria, hemoptysis, diaphoresis, weakness, presyncope, syncope, orthopnea, and PND.    Home Medications    Prior to Admission medications   Medication Sig Start Date End Date Taking? Authorizing Provider  Accu-Chek Softclix Lancets lancets SMARTSIG:Topical 1-4 Times Daily 05/27/20   [provider]  acetaminophen (TYLENOL) 500 MG tablet Take 1,000 mg by mouth every 6 (six) hours as needed for mild pain.    [provider]  aspirin 81 MG EC tablet TAKE 1 TABLET (81 MG TOTAL) BY MOUTH DAILY. SWALLOW WHOLE. Patient taking differently: Take 81 mg by mouth daily. 05/26/20 05/26/21  Thompson, Ava, DO  atorvastatin (LIPITOR) 40 MG tablet TAKE 1 TABLET (40 MG TOTAL) BY MOUTH DAILY. Patient taking differently: Take 40 mg by mouth daily. 05/26/20 05/26/21  Thompson, Ava, DO  blood glucose meter kit and supplies KIT Dispense based on patient and insurance preference. Use up to four times daily as directed. (FOR ICD-9 250.00, 250.01). 05/26/20   Thompson, Ava, DO  diclofenac Sodium (VOLTAREN) 1 % GEL Apply 2 g topically 4 (four) times daily. 12/08/20   Thompson, Derek, Thompson  etodolac (LODINE) 500 MG tablet Take 500 mg by mouth 2 (two) times daily. 12/04/20   [provider]  FARXIGA 10 MG TABS tablet TAKE 1 TABLET(10 MG) BY MOUTH DAILY Patient taking differently: Take 10 mg by mouth daily. 09/11/20   Thompson, Derek Corwin, MD  glipiZIDE (GLUCOTROL) 5 MG tablet TAKE 1 TABLET (5 MG TOTAL) BY MOUTH TWO TIMES DAILY. Patient taking differently: Take 5 mg by mouth in the morning and at bedtime. 05/26/20 05/26/21  Thompson, Ava, DO  metFORMIN (GLUCOPHAGE) 500 MG tablet Take 1 tablet (500 mg total) by mouth 2 (two) times daily with a meal. 05/03/20   Derek Boss, MD  metoprolol succinate (TOPROL-XL) 25 MG 24  hr tablet TAKE 1 TABLET(25 MG) BY MOUTH DAILY Patient taking differently: Take 25 mg by mouth daily. 10/12/20   Thompson, Derek Corwin, MD  Multiple Vitamin (MULTIVITAMIN) tablet Take 1 tablet by mouth daily.    [provider]  sacubitril-valsartan (ENTRESTO) 24-26 MG Take 1 tablet by mouth 2 (two) times daily. 08/21/20   Troy Sine, MD  spironolactone (ALDACTONE) 25 MG tablet TAKE 1/2 TABLET (12.5 MG TOTAL) BY MOUTH DAILY. Patient taking differently: Take 12.5 mg by mouth daily. 05/26/20 05/26/21  Thompson, Ava, DO  tamsulosin (FLOMAX) 0.4 MG CAPS capsule Take 0.4 mg by mouth at bedtime. 10/10/20   [provider]    Family History    Family History  Problem Relation Age of Onset   Breast cancer Mother        dx 30s/40s, twice   Cancer Maternal Grandfather        unknown type, d. >50   He indicated that his mother is alive. He indicated that the status of his father is unknown and reported the following: no info. He indicated that his maternal grandmother is deceased.  He indicated that his maternal grandfather is deceased. He indicated that the status of his paternal grandmother is unknown and reported the following: no info. He indicated that the status of his paternal grandfather is unknown and reported the following: no info. He indicated that the status of his maternal aunt is unknown. He indicated that the status of his maternal uncle is unknown. He indicated that his half-brother is alive.  Social History    Social History   Socioeconomic History   Marital status: Divorced    Spouse name: Not on file   Number of children: 3   Years of education: Not on file   Highest education level: Not on file  Occupational History   Not on file  Tobacco Use   Smoking status: Former    Packs/day: 1.00    Years: 43.00    Pack years: 43.00    Types: Cigarettes    Quit date: 05/22/2017    Years since quitting: 3.6   Smokeless tobacco: Never  Vaping Use   Vaping Use: Never  used  Substance and Sexual Activity   Alcohol use: Not Currently    Comment: last drink 2015   Drug use: Not Currently   Sexual activity: Not Currently  Other Topics Concern   Not on file  Social History Narrative   3 sons   Social Determinants of Health   Financial Resource Strain: Not on file  Food Insecurity: Not on file  Transportation Needs: Not on file  Physical Activity: Not on file  Stress: Not on file  Social Connections: Not on file  Intimate Partner Violence: Not on file     Review of Systems    General:  No chills, fever, night sweats or weight changes.  Cardiovascular:  No chest pain, dyspnea on exertion, edema, orthopnea, palpitations, paroxysmal nocturnal dyspnea. Dermatological: No rash, lesions/masses Respiratory: No cough, dyspnea Urologic: No hematuria, dysuria Abdominal:   No nausea, vomiting, diarrhea, bright red blood per rectum, melena, or hematemesis Neurologic:  No visual changes, wkns, changes in mental status. All other systems reviewed and are otherwise negative except as noted above.  Physical Exam    VS:  BP 133/74   Pulse 80   Ht 6' (1.829 Thompson)   Wt 188 lb 6.4 oz (85.5 kg)   SpO2 98%   BMI 25.55 kg/Thompson  , BMI Body mass index is 25.55 kg/Thompson. GEN: Well nourished, well developed, in no acute distress. HEENT: normal. Neck: Supple, no JVD, carotid bruits, or masses. Cardiac: RRR, no murmurs, rubs, or gallops. No clubbing, cyanosis, edema.  Radials/DP/PT 2+ and equal bilaterally.  Respiratory:  Respirations regular and unlabored, clear to auscultation bilaterally. GI: Soft, nontender, nondistended, BS + x 4. MS: no deformity or atrophy. Skin: warm and dry, no rash. Neuro:  Strength and sensation are intact. Psych: Normal affect.  Accessory Clinical Findings    Recent Labs: 05/22/2020: B Natriuretic Peptide 636.6 05/23/2020: ALT 14; TSH 0.632 05/25/2020: Magnesium 2.1 05/26/2020: Hemoglobin 12.2; Platelets 360 07/03/2020: BUN 17; Creatinine,  Ser 1.17; Potassium 4.8; Sodium 137   Recent Lipid Panel    Component Value Date/Time   CHOL 147 05/24/2020 0144   TRIG 94 05/24/2020 0144   HDL 31 (L) 05/24/2020 0144   CHOLHDL 4.7 05/24/2020 0144   VLDL 19 05/24/2020 0144   LDLCALC 97 05/24/2020 0144    ECG personally reviewed by me today-sinus rhythm with premature atrial complexes left bundle branch block 80 bpm  Echocardiogram 05/23/2018 IMPRESSIONS  1. Left ventricular ejection fraction, by estimation, is 25 to 30%. The  left ventricle has severely decreased function. The left ventricle  demonstrates regional wall motion abnormalities (see scoring  diagram/findings for description). Left ventricular  diastolic parameters are indeterminate. Elevated left ventricular  end-diastolic pressure. There is akinesis of the left ventricular,  basal-mid inferoseptal wall and anteroseptal wall. There is akinesis of  the left ventricular, apical septal wall. There  is akinesis of the left ventricular, entire inferior wall. There is severe  hypokinesis of the left ventricular, basal-mid inferolateral wall.   2. Right ventricular systolic function is normal. The right ventricular  size is normal. Tricuspid regurgitation signal is inadequate for assessing  PA pressure.   3. The mitral valve is degenerative. Mild mitral valve regurgitation. No  evidence of mitral stenosis.   4. The aortic valve has an indeterminant number of cusps. Aortic valve  regurgitation is not visualized. No aortic stenosis is present.   5. The inferior vena cava is normal in size with greater than 50%  respiratory variability, suggesting right atrial pressure of 3 mmHg.  Cardiac catheterization 05/25/2020 Prox RCA to Mid RCA lesion is 60% stenosed. Dist RCA lesion is 50% stenosed. Ost LAD to Mid LAD lesion is 35% stenosed. 1st Diag lesion is 30% stenosed. Dist LAD lesion is 50% stenosed. Ramus lesion is 80% stenosed. Ost Cx to Prox Cx lesion is 50%  stenosed. LV end diastolic pressure is normal.   1. Single vessel obstructive CAD involving a ramus intermediate branch 2. Normal right heart pressures. PAP mean 11 mm Hg 3. Low LV filling pressures. PCWP 5 mm Hg.  4. Preserved cardiac output with index 2.79.    Plan: LV dysfunction is out of proportion to degree of CAD. Recommend  medical therapy. Optimize CHF therapy. Appears to be volume depleted currently.  Diagnostic Dominance: Right Intervention   Assessment & Plan   1.  Nonischemic cardiomyopathy/chronic systolic CHF-NYHA class II.  Euvolemic today.  Echocardiogram showed LVEF 25-30%. Continue metoprolol, spironolactone, Marcelline Deist, Entresto Repeat echocardiogram  Heart healthy low-sodium diet Continue to increase physical activity  Coronary artery disease-denies recent episodes of arm neck back or chest discomfort.  Underwent cardiac catheterization 05/25/2020 which showed 80% stenosis of ramus intermediate branch and otherwise nonobstructive CAD.  Medical management was recommended.  Denied anginal symptoms on follow-up visit 06/10/2020. Continue metoprolol, statin, aspirin Heart healthy low-sodium diet-salty 6 given Increase physical activity as tolerated  Hyperlipidemia-05/24/2020: Cholesterol 147; HDL 31; LDL Cholesterol 97; Triglycerides 94; VLDL 19 Continue aspirin, statin Heart healthy low-sodium high-fiber diet Increase physical activity as tolerated  Essential hypertension-BP today 133/74.  Well-controlled at home. Continue metoprolol, Entresto, spironolactone Heart healthy low-sodium diet-salty 6 given Increase physical activity as tolerated   Syncope-no further episodes of syncope.  Recommended outpatient cardiac event monitor.  Monitor was not completed.     Preoperative cardiac evaluation-radioactive seed implant, urology, Dr. Jettie Pagan     Primary Cardiologist: [name], MD  Chart reviewed as part of pre-operative protocol coverage. Given past  medical history and time since last visit, based on ACC/AHA guidelines, Marquin Hinderman would be at acceptable risk for the planned procedure without further cardiovascular testing.   Patient was advised that if he develops new symptoms prior to surgery to contact our office to arrange a follow-up appointment.  He verbalized understanding.  I will route this recommendation to the requesting party via Epic fax function and remove from pre-op pool.  Please call with questions.  Disposition: To follow-up with Dr. Debara Pickett or me in 1-2 months.    Jossie Ng. Landen Breeland NP-C    01/08/2021, 3:23 PM [address] Office [phone] Fax [phone]  Notice: This dictation was prepared with Dragon dictation along with smaller phrase technology. Any transcriptional errors that result from this process are unintentional and may not be corrected upon review.  I spent 14 minutes examining this patient, reviewing medications, and using patient centered shared decision making involving her cardiac care.  Prior to her visit I spent greater than 20 minutes reviewing her past medical history,  medications, and prior cardiac tests.

## 2021-01-08 ENCOUNTER — Encounter: Payer: Self-pay | Admitting: General Practice

## 2021-01-08 ENCOUNTER — Other Ambulatory Visit: Payer: Self-pay

## 2021-01-08 ENCOUNTER — Ambulatory Visit (INDEPENDENT_AMBULATORY_CARE_PROVIDER_SITE_OTHER): Payer: BC Managed Care – PPO | Admitting: General Practice

## 2021-01-08 VITALS — BP 133/74 | HR 80 | Ht 72.0 in | Wt 188.4 lb

## 2021-01-08 DIAGNOSIS — I428 Other cardiomyopathies: Secondary | ICD-10-CM | POA: Diagnosis not present

## 2021-01-08 DIAGNOSIS — I5042 Chronic combined systolic (congestive) and diastolic (congestive) heart failure: Secondary | ICD-10-CM

## 2021-01-08 DIAGNOSIS — I10 Essential (primary) hypertension: Secondary | ICD-10-CM

## 2021-01-08 DIAGNOSIS — E785 Hyperlipidemia, unspecified: Secondary | ICD-10-CM

## 2021-01-08 DIAGNOSIS — I2510 Atherosclerotic heart disease of native coronary artery without angina pectoris: Secondary | ICD-10-CM

## 2021-01-08 DIAGNOSIS — R55 Syncope and collapse: Secondary | ICD-10-CM

## 2021-01-08 NOTE — Patient Instructions (Addendum)
Medication Instructions:  The current medical regimen is effective;  continue present plan and medications as directed. Please refer to the Current Medication list given to you today.  *If you need a refill on your cardiac medications before your next appointment, please call your pharmacy*  Lab Work: NONE  Testing/Procedures: Echocardiogram - Your physician has requested that you have an echocardiogram. Echocardiography is a painless test that uses sound waves to create images of your heart. It provides your doctor with information about the size and shape of your heart and how well your heart's chambers and valves are working. This procedure takes approximately one hour. There are no restrictions for this procedure. This will be performed at our [facility] location - [address].   Special Instructions CLEARED FOR IMPLANT PROCEDURE   Follow-Up: Your next appointment:  A FEW AFTER ECHO In Person with K. Mali Hilty, MD   At [facility], you and your health needs are our priority.  As part of our continuing mission to provide you with exceptional heart care, we have created designated Provider Care Teams.  These Care Teams include your primary Cardiologist (physician) and Advanced Practice Providers (APPs -  Physician Assistants and Nurse Practitioners) who all work together to provide you with the care you need, when you need it.

## 2021-01-12 ENCOUNTER — Encounter (HOSPITAL_COMMUNITY): Payer: Self-pay

## 2021-01-12 ENCOUNTER — Encounter (HOSPITAL_COMMUNITY): Payer: Self-pay | Admitting: Anesthesiology

## 2021-01-12 ENCOUNTER — Other Ambulatory Visit: Payer: Self-pay

## 2021-01-12 ENCOUNTER — Encounter (HOSPITAL_COMMUNITY): Payer: Self-pay | Admitting: Physician Assistant

## 2021-01-12 ENCOUNTER — Encounter (HOSPITAL_COMMUNITY)
Admission: RE | Admit: 2021-01-12 | Discharge: 2021-01-12 | Disposition: A | Discharge status: Home / Self Care | Payer: BC Managed Care – PPO | Source: Ambulatory Visit | Attending: Urology | Admitting: Urology

## 2021-01-12 DIAGNOSIS — I110 Hypertensive heart disease with heart failure: Secondary | ICD-10-CM | POA: Insufficient documentation

## 2021-01-12 DIAGNOSIS — E119 Type 2 diabetes mellitus without complications: Secondary | ICD-10-CM | POA: Diagnosis not present

## 2021-01-12 DIAGNOSIS — Z79899 Other long term (current) drug therapy: Secondary | ICD-10-CM | POA: Insufficient documentation

## 2021-01-12 DIAGNOSIS — I509 Heart failure, unspecified: Secondary | ICD-10-CM | POA: Diagnosis not present

## 2021-01-12 DIAGNOSIS — Z87891 Personal history of nicotine dependence: Secondary | ICD-10-CM | POA: Insufficient documentation

## 2021-01-12 DIAGNOSIS — Z01812 Encounter for preprocedural laboratory examination: Secondary | ICD-10-CM | POA: Diagnosis present

## 2021-01-12 DIAGNOSIS — I2510 Atherosclerotic heart disease of native coronary artery without angina pectoris: Secondary | ICD-10-CM | POA: Insufficient documentation

## 2021-01-12 DIAGNOSIS — Z7982 Long term (current) use of aspirin: Secondary | ICD-10-CM | POA: Diagnosis not present

## 2021-01-12 HISTORY — DX: Acute myocardial infarction, unspecified: I21.9

## 2021-01-12 HISTORY — DX: Anemia, unspecified: D64.9

## 2021-01-12 LAB — COMPREHENSIVE METABOLIC PANEL
ALT: 15 U/L (ref 0–44)
AST: 15 U/L (ref 15–41)
Albumin: 3.8 g/dL (ref 3.5–5.0)
Alkaline Phosphatase: 85 U/L (ref 38–126)
Anion gap: 9 (ref 5–15)
BUN: 17 mg/dL (ref 8–23)
CO2: 22 mmol/L (ref 22–32)
Calcium: 9.3 mg/dL (ref 8.9–10.3)
Chloride: 107 mmol/L (ref 98–111)
Creatinine, Ser: 0.66 mg/dL (ref 0.61–1.24)
GFR, Estimated: >60 mL/min (ref >60)
Glucose, Bld: 302 mg/dL — ABNORMAL HIGH (ref 70–99)
Potassium: 4.3 mmol/L (ref 3.5–5.1)
Sodium: 138 mmol/L (ref 135–145)
Total Bilirubin: 0.6 mg/dL (ref 0.3–1.2)
Total Protein: 7.4 g/dL (ref 6.5–8.1)

## 2021-01-12 LAB — CBC
HCT: 40.5 % (ref 39.0–52.0)
Hemoglobin: 13.6 g/dL (ref 13.0–17.0)
MCH: 30.2 pg (ref 26.0–34.0)
MCHC: 33.6 g/dL (ref 30.0–36.0)
MCV: 89.8 fL (ref 80.0–100.0)
Platelets: 254 10*3/uL (ref 150–400)
RBC: 4.51 MIL/uL (ref 4.22–5.81)
RDW: 12.6 % (ref 11.5–15.5)
WBC: 8.9 10*3/uL (ref 4.0–10.5)
nRBC: 0 % (ref 0.0–0.2)

## 2021-01-12 LAB — APTT: aPTT: 25 seconds (ref 24–36)

## 2021-01-12 LAB — HEMOGLOBIN A1C
Hgb A1c MFr Bld: 8.1 % — ABNORMAL HIGH (ref 4.8–5.6)
Mean Plasma Glucose: 185.77 mg/dL

## 2021-01-12 LAB — PROTIME-INR
INR: 1 (ref 0.8–1.2)
Prothrombin Time: 12.8 seconds (ref 11.4–15.2)

## 2021-01-12 LAB — GLUCOSE, CAPILLARY: Glucose-Capillary: 314 mg/dL — ABNORMAL HIGH (ref 70–99)

## 2021-01-12 NOTE — Progress Notes (Addendum)
Anesthesia Review:  PCP: DR Doristine Section Bonsu  Cardiologist : DR Debara Pickett 01/08/2021- LOV Denyse Amass Cleaver,NP - preop on eval  Chest x-ray : 12/04/2020  EKG : 01/08/2021  Carotids- 07/01/20  Echo : 05/23/20  Stress test: Cardiac Cath :  05/25/20  Activity level:  Sleep Study/ CPAP : Fasting Blood Sugar :      / Checks Blood Sugar -- times a day:   Blood Thinner/ Instructions /Last Dose: ASA / Instructions/ Last Dose :   81 mg Aspirin  DM- type 2 - checks glucose once in a while per pt  Glucose at preop was 314 Hgba1c-01/12/21-  8.1  No covid test required- ambulatory surgery  CMp  and hgba1c done 01/12/21- routed to DR Abner Greenspan.Marland Kitchen

## 2021-01-20 NOTE — Progress Notes (Signed)
Anesthesia Chart Review   Case: [id number] Date/Time: 02/01/21 1145   Procedures:      RADIOACTIVE SEED IMPLANT/BRACHYTHERAPY IMPLANT     SPACE OAR INSTILLATION   Anesthesia type: General   Pre-op diagnosis: PROSTATE CANCER   Location: [facility]   Surgeons: Derek Lima, MD       DISCUSSION:60 y.o. former smoker with h/o HTN, CAD, CHF (EF 25-30%), DM II, prostate cancer scheduled for above procedure 02/01/2021 with Dr. Rexene Thompson.   Pt last seen by cardiology 01/08/2021. Pt euvolemic at that time. Per OV note, "Chart reviewed as part of pre-operative protocol coverage. Given past medical history and time since last visit, based on ACC/AHA guidelines, Derek Thompson would be at acceptable risk for the planned procedure without further cardiovascular testing.    Patient was advised that if he develops new symptoms prior to surgery."  Anticipate pt can proceed with planned procedure barring acute status change.   VS: BP 130/80   Temp 36.8 C (Oral)   Resp 16   Ht 6' (1.829 Thompson)   Wt 85.3 kg   SpO2 100%   BMI 25.50 kg/Thompson   PROVIDERS: Derek Mccreedy, MD is PCP   Derek Bishop, MD is Cardiologist  LABS: Labs reviewed: Acceptable for surgery. (all labs ordered are listed, but only abnormal results are displayed)  Labs Reviewed  COMPREHENSIVE METABOLIC PANEL - Abnormal; Notable for the following components:      Result Value   Glucose, Bld 302 (*)    All other components within normal limits  HEMOGLOBIN A1C - Abnormal; Notable for the following components:   Hgb A1c MFr Bld 8.1 (*)    All other components within normal limits  GLUCOSE, CAPILLARY - Abnormal; Notable for the following components:   Glucose-Capillary 314 (*)    All other components within normal limits  CBC  PROTIME-INR  APTT     IMAGES:   EKG: 01/08/2021 Rate 80 bpm  Sinus rhythm with premature atrial complexes LBBB  CV: Cardiac Cath 05/25/2020 Prox RCA to Mid RCA lesion is 60%  stenosed. Dist RCA lesion is 50% stenosed. Ost LAD to Mid LAD lesion is 35% stenosed. 1st Diag lesion is 30% stenosed. Dist LAD lesion is 50% stenosed. Ramus lesion is 80% stenosed. Ost Cx to Prox Cx lesion is 50% stenosed. LV end diastolic pressure is normal.   1. Single vessel obstructive CAD involving a ramus intermediate branch 2. Normal right heart pressures. PAP mean 11 mm Hg 3. Low LV filling pressures. PCWP 5 mm Hg.  4. Preserved cardiac output with index 2.79.    Plan: LV dysfunction is out of proportion to degree of CAD. Recommend  medical therapy. Optimize CHF therapy. Appears to be volume depleted currently.  Echo 05/23/20   1. Left ventricular ejection fraction, by estimation, is 25 to 30%. The  left ventricle has severely decreased function. The left ventricle  demonstrates regional wall motion abnormalities (see scoring  diagram/findings for description). Left ventricular  diastolic parameters are indeterminate. Elevated left ventricular  end-diastolic pressure. There is akinesis of the left ventricular,  basal-mid inferoseptal wall and anteroseptal wall. There is akinesis of  the left ventricular, apical septal wall. There  is akinesis of the left ventricular, entire inferior wall. There is severe  hypokinesis of the left ventricular, basal-mid inferolateral wall.   2. Right ventricular systolic function is normal. The right ventricular  size is normal. Tricuspid regurgitation signal is inadequate for assessing  PA pressure.  3. The mitral valve is degenerative. Mild mitral valve regurgitation. No  evidence of mitral stenosis.   4. The aortic valve has an indeterminant number of cusps. Aortic valve  regurgitation is not visualized. No aortic stenosis is present.   5. The inferior vena cava is normal in size with greater than 50%  respiratory variability, suggesting right atrial pressure of 3 mmHg. Past Medical History:  Diagnosis Date   Anemia    Diabetes  mellitus without complication ([place])    Family history of breast cancer    Hypertension    Myocardial infarction [facility])    Prostate cancer [facility])     Past Surgical History:  Procedure Laterality Date   DENTAL SURGERY     RIGHT/LEFT HEART CATH AND CORONARY ANGIOGRAPHY N/A 05/25/2020   Procedure: RIGHT/LEFT HEART CATH AND CORONARY ANGIOGRAPHY;  Surgeon: Martinique, Peter M, MD;  Location: [place] [facility];  Service: Cardiovascular;  Laterality: N/A;    MEDICATIONS:  Accu-Chek Softclix Lancets lancets   acetaminophen (TYLENOL) 500 MG tablet   aspirin 81 MG EC tablet   atorvastatin (LIPITOR) 40 MG tablet   blood glucose meter kit and supplies KIT   diclofenac Sodium (VOLTAREN) 1 % GEL   etodolac (LODINE) 500 MG tablet   FARXIGA 10 MG TABS tablet   glipiZIDE (GLUCOTROL) 5 MG tablet   metFORMIN (GLUCOPHAGE) 500 MG tablet   metoprolol succinate (TOPROL-XL) 25 MG 24 hr tablet   Multiple Vitamin (MULTIVITAMIN) tablet   sacubitril-valsartan (ENTRESTO) 24-26 MG   spironolactone (ALDACTONE) 25 MG tablet   tamsulosin (FLOMAX) 0.4 MG CAPS capsule   No current facility-administered medications for this encounter.    Derek Felix Ward, PA-C [facility] [phone]

## 2021-01-20 NOTE — Anesthesia Preprocedure Evaluation (Deleted)
Anesthesia Evaluation  Patient identified by MRN, date of birth, ID band Patient awake    Reviewed: Allergy & Precautions, NPO status , Patient's Chart, lab work & pertinent test results  Airway        Dental   Pulmonary former smoker,           Cardiovascular hypertension, Pt. on medications + CAD and + Past MI    05/25/20 ECHO:  1. Left ventricular ejection fraction, by estimation, is 25 to 30%. The  left ventricle has severely decreased function. The left ventricle  demonstrates regional wall motion abnormalities (see scoring  diagram/findings for description). Left ventricular  diastolic parameters are indeterminate. Elevated left ventricular  end-diastolic pressure. There is akinesis of the left ventricular,  basal-mid inferoseptal wall and anteroseptal wall. There is akinesis of  the left ventricular, apical septal wall. There  is akinesis of the left ventricular, entire inferior wall. There is severe  hypokinesis of the left ventricular, basal-mid inferolateral wall.  2. Right ventricular systolic function is normal. The right ventricular  size is normal. Tricuspid regurgitation signal is inadequate for assessing  PA pressure.  3. The mitral valve is degenerative. Mild mitral valve regurgitation. No  evidence of mitral stenosis.  4. The aortic valve has an indeterminant number of cusps. Aortic valve  regurgitation is not visualized. No aortic stenosis is present.  5. The inferior vena cava is normal in size with greater than 50%  respiratory variability, suggesting right atrial pressure of 3 mmHg.    Neuro/Psych negative neurological ROS  negative psych ROS   GI/Hepatic negative GI ROS, Neg liver ROS,   Endo/Other  diabetes  Renal/GU negative Renal ROS   Prostate cancer    Musculoskeletal negative musculoskeletal ROS (+)   Abdominal   Peds negative pediatric ROS (+)  Hematology  (+) anemia ,    Anesthesia Other Findings   Reproductive/Obstetrics negative OB ROS                            Anesthesia Physical Anesthesia Plan  ASA: 4  Anesthesia Plan: General   Post-op Pain Management:    Induction: Intravenous  PONV Risk Score and Plan:   Airway Management Planned: LMA  Additional Equipment: None  Intra-op Plan:   Post-operative Plan: Extubation in OR  Informed Consent: I have reviewed the patients History and Physical, chart, labs and discussed the procedure including the risks, benefits and alternatives for the proposed anesthesia with the patient or authorized representative who has indicated his/her understanding and acceptance.     Dental advisory given  Plan Discussed with: CRNA, Anesthesiologist and Surgeon  Anesthesia Plan Comments: (Cardiologist preop eval: Primary Cardiologist: Derek Casino, MD  Chart reviewed as part of pre-operative protocol coverage. Given past medical history and time since last visit, based on ACC/AHA guidelines, Derek Thompson would be at acceptable risk for the planned procedure without further cardiovascular testing.   Patient was advised that if he develops new symptoms prior to surgery to contact our office to arrange a follow-up appointment.  He verbalized understanding.  I will route this recommendation to the requesting party via Epic fax function and remove from pre-op pool.  Please call with questions. GA/LMA. Low EF. Phenylephrine avail. Derek Blizzard, MD    Disposition: To follow-up with Dr. Debara Thompson or me in 1-2 months.    Derek Ng. Cleaver NP-C  See PAT note 01/12/2021, Derek Felix Ward, PA-C)  Anesthesia Quick Evaluation  

## 2021-01-29 ENCOUNTER — Telehealth: Payer: Self-pay | Admitting: *Deleted

## 2021-01-29 NOTE — Telephone Encounter (Signed)
CALLED PATIENT TO REMIND OF PROCEDURE FOR 02-01-21, LVM FOR A RETURN CALL

## 2021-02-01 ENCOUNTER — Ambulatory Visit (HOSPITAL_COMMUNITY)
Admission: RE | Admit: 2021-02-01 | Discharge: 2021-02-01 | Disposition: A | Discharge status: Home / Self Care | Payer: BC Managed Care – PPO | Attending: Urology | Admitting: Urology

## 2021-02-01 ENCOUNTER — Encounter (HOSPITAL_COMMUNITY)
Admission: RE | Disposition: A | Discharge status: Home / Self Care | Payer: Self-pay | Source: Home / Self Care | Attending: Urology

## 2021-02-01 DIAGNOSIS — Z539 Procedure and treatment not carried out, unspecified reason: Secondary | ICD-10-CM | POA: Insufficient documentation

## 2021-02-01 DIAGNOSIS — R350 Frequency of micturition: Secondary | ICD-10-CM | POA: Diagnosis not present

## 2021-02-01 DIAGNOSIS — Z79899 Other long term (current) drug therapy: Secondary | ICD-10-CM | POA: Insufficient documentation

## 2021-02-01 DIAGNOSIS — N401 Enlarged prostate with lower urinary tract symptoms: Secondary | ICD-10-CM | POA: Insufficient documentation

## 2021-02-01 DIAGNOSIS — I110 Hypertensive heart disease with heart failure: Secondary | ICD-10-CM | POA: Insufficient documentation

## 2021-02-01 DIAGNOSIS — C61 Malignant neoplasm of prostate: Secondary | ICD-10-CM | POA: Diagnosis present

## 2021-02-01 DIAGNOSIS — Z87891 Personal history of nicotine dependence: Secondary | ICD-10-CM | POA: Insufficient documentation

## 2021-02-01 DIAGNOSIS — Z8546 Personal history of malignant neoplasm of prostate: Secondary | ICD-10-CM | POA: Insufficient documentation

## 2021-02-01 DIAGNOSIS — Z7982 Long term (current) use of aspirin: Secondary | ICD-10-CM | POA: Diagnosis not present

## 2021-02-01 DIAGNOSIS — E119 Type 2 diabetes mellitus without complications: Secondary | ICD-10-CM | POA: Insufficient documentation

## 2021-02-01 DIAGNOSIS — I252 Old myocardial infarction: Secondary | ICD-10-CM | POA: Diagnosis not present

## 2021-02-01 DIAGNOSIS — I509 Heart failure, unspecified: Secondary | ICD-10-CM | POA: Diagnosis not present

## 2021-02-01 LAB — GLUCOSE, CAPILLARY: Glucose-Capillary: 220 mg/dL — ABNORMAL HIGH (ref 70–99)

## 2021-02-01 SURGERY — INSERTION, RADIATION SOURCE, PROSTATE
Anesthesia: General

## 2021-02-01 MED ORDER — DEXAMETHASONE SODIUM PHOSPHATE 10 MG/ML IJ SOLN
INTRAMUSCULAR | Status: AC
  Filled 2021-02-01: qty 1

## 2021-02-01 MED ORDER — LACTATED RINGERS IV SOLN: INTRAVENOUS | Status: DC

## 2021-02-01 MED ORDER — ACETAMINOPHEN 500 MG PO TABS
1000.0000 mg | ORAL_TABLET | Freq: Once | ORAL | Status: AC
  Administered 2021-02-01: 1000 mg via ORAL
  Filled 2021-02-01: qty 2

## 2021-02-01 MED ORDER — FENTANYL CITRATE (PF) 250 MCG/5ML IJ SOLN
INTRAMUSCULAR | Status: AC
  Filled 2021-02-01: qty 5

## 2021-02-01 MED ORDER — FLEET ENEMA 7-19 GM/118ML RE ENEM
1.0000 | ENEMA | Freq: Once | RECTAL | Status: DC
  Filled 2021-02-01: qty 1

## 2021-02-01 MED ORDER — ONDANSETRON HCL 4 MG/2ML IJ SOLN
INTRAMUSCULAR | Status: AC
  Filled 2021-02-01: qty 2

## 2021-02-01 MED ORDER — LIDOCAINE HCL (PF) 2 % IJ SOLN
INTRAMUSCULAR | Status: AC
  Filled 2021-02-01: qty 5

## 2021-02-01 MED ORDER — PROPOFOL 10 MG/ML IV BOLUS
INTRAVENOUS | Status: AC
  Filled 2021-02-01: qty 20

## 2021-02-01 MED ORDER — CIPROFLOXACIN IN D5W 400 MG/200ML IV SOLN
400.0000 mg | INTRAVENOUS | Status: DC
  Filled 2021-02-01: qty 200

## 2021-02-01 NOTE — Progress Notes (Signed)
Power outage causing delay in surgery. Due to staffing, patient rescheduled to Thursday.

## 2021-02-01 NOTE — H&P (Addendum)
Office Visit Report     10/20/2020   --------------------------------------------------------------------------------   Derek Thompson  MRN: [id number]  DOB: 15-Sep-1960, 60 year old Male  SSN:    PRIMARY CARE:  Benito Mccreedy, MD  REFERRING:  Benito Mccreedy, MD  PROVIDER:  Rexene Alberts, M.D.  LOCATION:  [facility] [phone]     --------------------------------------------------------------------------------   CC/HPI: Derek Thompson is a 60 year old male seen in f/u with prostate cancer.   He has met with Dr. Tammi Klippel and has elected to proceed with EBRT plus brachii plus ADT.   He is followed by Dr. Doristine Section Bonsu, PCP, and PSA obtained on 05/21/2020 resulted 39.9 NG/mL. He has noticed some weight loss over the past couple of months. He has stable appetite. He states his father was diagnosed with prostate cancer and underwent radiation therapy.   Patient underwent prostate biopsy on 08/18/2020 for an elevated PSA of 39.9 ng/mL. Biopsy revealed GS 5+ 4 = 9 in 12/12 cores, adenocarcinoma of the prostate with 12/12 cores positive (90-95%), TRUS volume of 41cm3. Denies new or worsening bone or back pain. Good appetite. He has had some weight loss over the past several months, he estimates about 30 pounds however has been gaining this back.   Family history: Father, treated with radiation therapy  Imaging studies: PSMA PET scan 09/10/2020 with activity in the periphery of the prostate gland extending into the seminal vesicles, 3 small foci of intense radiotracer activity in the pelvis involving the presacral space and left perirectal fat without discrete lesions on CT scan concerning for subtle CT occult nodal metastasis   PMH: CHF, HTN, DM  PSH: None   TNM stage: [id number]  PSA: 39.9 on 05/21/2020  Gleason score: GS 5+ 4 = 9  Biopsy: 08/18/2020  Left: All cores 5+4=9  Right: All cores 5+4=9  Prostate volume: 41cm3  PSAD: 0.97   Nomogram  CSS (5 year, 10 year):  97, 93  PFS (5 year, 10 year): 13, 7  EPE: 97  LNI: 71  SVI:57   IPSS: 21, 4. On tamsulosin 0.$RemoveBeforeD'4mg'tQPTKjWrbnwUZb$ .  SHIM: 9     ALLERGIES: NKDA    MEDICATIONS: Aspirin 81 mg tablet,chewable  Metformin Hcl 500 mg tablet  Metoprolol Succinate 25 mg tablet, extended release 24 hr  Tamsulosin Hcl 0.4 mg capsule 1 capsule PO Q HS  Atorvastatin Calcium 40 mg tablet  Blood Glucose Monitoring kit  Entresto 24 mg-26 mg tablet  Farxiga 10 mg tablet  Flax Seed Oil  Glipizide 5 mg tablet  Ondansetron Hcl 8 mg tablet  Polyethylene Glycol  Spironolactone 25 mg tablet  Vitamin B Complex  Vitamin C  Vitamin E     GU PSH: Prostate Needle Biopsy - 08/18/2020     NON-GU PSH: Surgical Pathology, Gross And Microscopic Examination For Prostate Needle - 08/18/2020     GU PMH: BPH w/LUTS - 09/17/2020, - 08/18/2020, - 07/16/2020 Prostate Cancer - 09/17/2020, - 08/25/2020 Urinary Frequency - 09/17/2020, - 08/25/2020, - 08/18/2020, - 07/16/2020 Weak Urinary Stream - 09/17/2020, - 08/25/2020, - 07/16/2020 Elevated PSA - 08/18/2020, - 07/16/2020 Incomplete bladder emptying - 08/18/2020, - 07/16/2020    NON-GU PMH: Diabetes Type 2 Heart disease, unspecified Hypertension    FAMILY HISTORY: 3 Son's - Runs in Family   SOCIAL HISTORY: Marital Status: Single Ethnicity: Not Hispanic Or Latino; Race: Black or African American Current Smoking Status: Patient does not smoke anymore. Has not smoked since 06/30/2017. Smoked for 30 years. Smoked 1 pack  per day.   Tobacco Use Assessment Completed: Used Tobacco in last 30 days? Has never drank.  Drinks 1 caffeinated drink per day.    REVIEW OF SYSTEMS:    GU Review Male:   Patient denies frequent urination, hard to postpone urination, burning/ pain with urination, get up at night to urinate, leakage of urine, stream starts and stops, trouble starting your stream, have to strain to urinate , erection problems, and penile pain.  Gastrointestinal (Upper):   Patient denies nausea,  vomiting, and indigestion/ heartburn.  Gastrointestinal (Lower):   Patient denies diarrhea and constipation.  Constitutional:   Patient denies fever, night sweats, weight loss, and fatigue.  Skin:   Patient denies skin rash/ lesion and itching.  Eyes:   Patient denies blurred vision and double vision.  Ears/ Nose/ Throat:   Patient denies sore throat and sinus problems.  Hematologic/Lymphatic:   Patient denies swollen glands and easy bruising.  Cardiovascular:   Patient denies leg swelling and chest pains.  Respiratory:   Patient denies cough and shortness of breath.  Endocrine:   Patient denies excessive thirst.  Musculoskeletal:   Patient denies back pain and joint pain.  Neurological:   Patient denies headaches and dizziness.  Psychologic:   Patient denies depression and anxiety.   VITAL SIGNS: None   MULTI-SYSTEM PHYSICAL EXAMINATION:    Constitutional: Well-nourished. No physical deformities. Normally developed. Good grooming.  Respiratory: No labored breathing, no use of accessory muscles.   Cardiovascular: Normal temperature, normal extremity pulses, no swelling, no varicosities.  Gastrointestinal: No mass, no tenderness, no rigidity, non obese abdomen.     Complexity of Data:  Source Of History:  Patient, Medical Record Summary  Lab Test Review:   PSA  Records Review:   AUA Symptom Score, Pathology Reports, Previous Doctor Records  Urine Test Review:   Urinalysis   07/16/20  PSA  Total PSA 33.10 ng/mL    PROCEDURES:          Urinalysis - 81003 Dipstick Dipstick Cont'd  Color: Yellow Bilirubin: Neg  Appearance: Clear Ketones: Neg  Specific Gravity: 1.025 Blood: Neg  pH: 6.0 Protein: Neg  Glucose: 2+ Urobilinogen: 0.2    Nitrites: Neg    Leukocyte Esterase: Neg    Notes:            Eligard $Remove'45mg'yviwgzI$ / 6 Month - W9675, [id number] The arm/ abdomen/ hip/ thigh was sterilely prepped with alcohol. Eligard was injected subcutaneously ([place]) using standard technique. The patient  tolerated the procedure well. A band aid was applied (not sure if this is necessary for a [place] injection). The site was dry when the patient left the exam room. The patient will return as scheduled.  After treatment was administered, patient was observed without any adverse reactions noted.    Qty: 45 Adm. By: Rene Paci  Unit: mg Lot No [id number]  Route: SQ Exp. Date 05/02/2022  Freq: None Mfgr.:   Site: Right Buttock   ASSESSMENT:      ICD-10 Details  1 GU:   Prostate Cancer - C61   2   BPH w/LUTS - N40.1   3   Urinary Frequency - R35.0    PLAN:           Schedule Labs: 6 Months - PSA  Return Visit/Planned Activity: 6 Months - Office Visit             Note: Eligard $RemoveBefor'45mg'GPGijasAruox$           Document Letter(s):  Created for  Patient: Clinical Summary         Notes:    1. 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 Tammi Klippel and has elected proceed with EBRT plus brachii plus ADT.  -45 mg Eligard given today. Will be due for another injection in 6 months (04/2021)  -Green sheet has already been submitted for brachytherapy with SpaceOAR. Discussed risk and benefits as below.    Brachytherapy/space OAR consent- The patient was counseled about the natural history of prostate cancer and the standard treatment options that are available for prostate cancer. It was explained to him how his age and life expectancy, clinical stage, Gleason score, and PSA affect his prognosis, the decision to proceed with additional staging studies, as well as how that information influences recommended treatment strategies. We discussed the roles for active surveillance, radiation therapy, surgical therapy, androgen deprivation, as  well as ablative therapy options for the treatment of prostate cancer as appropriate to his individual cancer situation. We discussed the risks and benefits of these options with regard to their impact on cancer control and also in terms of potential adverse events, complications, and impact on quality of life particularly related to urinary and sexual function. The patient was encouraged to ask questions throughout the discussion today and all questions were answered to his stated satisfaction. In addition, the patient was provided with and/or directed to appropriate resources and literature for further education about prostate cancer and treatment options.   The patient has decided to proceed with brachytherapy and SpaceOAR placement as primary treatment of his intermediate risk prostate cancer. The risks, benefits and alternatives of the aforementioned procedures was discussed in detail. Risks include, bur are not limited to worsening LUTS, erectile dysfunction, rectal irritation, urethral stricture formation, fistula formation, cancer recurrence, MI, CVA, PE, DVT and the inherent risk of general anesthesia. He voices understanding and wishes to proceed.    Discussion:  Using NCCN risk stratification criteria, his disease is considered very high risk and even regional risk when considering his PET scan. We discussed his diagnosis in detail, reviewing the significance of gleason score, PSA, DRE, and percentage of cores positive. We discussed the various management options for prostate cancer, including active surveillance, open and robotic radical prostatectomy, EBRT, brachytherapy, proton therapy, and cryotherapy. We discussed the generally indolent course of many prostate cancers, and the generally favorable oncologic control offered by all treatment strategies. However, I emphasized that all treatments offer only potential for cure and that in many cases multimodal therapy may ultimately be utilized for  long term cancer control. We also discussed the impact of treatment on sexual and urinary function. I emphasized that each treatment has unique quality of life impact and recovery profiles, and we reviewed the possible effects of surgery, radiation, and cryotherapy on quality of life outcomes. All questions were answered.   2. LUTS: Severe IPSS score 21. Discussed risk benefits of treatment. Continue tamsulosin 0.4 mg.   CC: Benito Mccreedy, MD        Next Appointment:      Next Appointment: 04/13/2021 01:45 PM    Appointment Type: Laboratory Appointment    Location: [facility] [phone]    Provider: Lab LAB  Reason for Visit: 6 mnths psa-Egon Dittus    Urology Preoperative H&P   Chief Complaint: Prostate cancer  History of Present Illness: Derek Thompson is a 60 y.o. male with prostate cancer here for brachytherapy. He tells me that he last ate an apple last night and not this morning.    Past Medical History:  Diagnosis Date   Anemia    Diabetes mellitus without complication ([place])    Family history of breast cancer    Hypertension    Myocardial infarction [facility])    Prostate cancer [facility])     Past Surgical History:  Procedure Laterality Date   DENTAL SURGERY     RIGHT/LEFT HEART CATH AND CORONARY ANGIOGRAPHY N/A 05/25/2020   Procedure: RIGHT/LEFT HEART CATH AND CORONARY ANGIOGRAPHY;  Surgeon: Martinique, Peter M, MD;  Location: [place] [facility];  Service: Cardiovascular;  Laterality: N/A;    Allergies: No Known Allergies  Family History  Problem Relation Age of Onset   Breast cancer Mother        dx 30s/40s, twice   Cancer Maternal Grandfather        unknown type, d. >50    Social History:  reports that he quit smoking about 3 years ago. His smoking use included cigarettes. He has a 43.00 pack-year smoking history. He has never used smokeless tobacco. He reports that he does not currently use alcohol. He reports that he does not currently use  drugs.  ROS: A complete review of systems was performed.  All systems are negative except for pertinent findings as noted.  Physical Exam:  Vital signs in last 24 hours: Temp:  [98.6 F (37 C)] 98.6 F (37 C) (10/03 1010) Pulse Rate:  [81] 81 (10/03 1010) Resp:  [16] 16 (10/03 1010) BP: (167)/(82) 167/82 (10/03 1010) SpO2:  [100 %] 100 % (10/03 1010) Weight:  [81.6 kg] 81.6 kg (10/03 1010) Constitutional:  Alert and oriented, No acute distress Cardiovascular: Regular rate and rhythm Respiratory: Normal respiratory effort, Lungs clear bilaterally GI: Abdomen is soft, nontender, nondistended, no abdominal masses GU: No CVA tenderness Lymphatic: No lymphadenopathy Neurologic: Grossly intact, no focal deficits Psychiatric: Normal mood and affect  Laboratory Data:  No results for input(s): WBC, HGB, HCT, PLT in the last 72 hours.  No results for input(s): NA, K, CL, GLUCOSE, BUN, CALCIUM, CREATININE in the last 72 hours.  Invalid input(s): CO3   Results for orders placed or performed during the hospital encounter of 02/01/21 (from the past 24 hour(s))  Glucose, capillary     Status: Abnormal   Collection Time: 02/01/21 10:10 AM  Result Value Ref Range   Glucose-Capillary 220 (H) 70 - 99 mg/dL   No results found for this or any previous visit (from the past 240 hour(s)).  Renal Function: No results for input(s): CREATININE in the last 168 hours. Estimated Creatinine Clearance: 106.4 mL/min (by C-G formula based on SCr of 0.66 mg/dL).  Radiologic Imaging: No results found.  I independently reviewed the above imaging studies.  Assessment and Plan Derek Thompson is a 60 y.o. male with prostate cancer here for brachytherapy. Ok to proceed.    Matt R. Gustav Knueppel MD 02/01/2021, 12:21 PM  [facility] Pager: [phone]): [phone]  Due to unexpected OR delays from power outage, there is not enough radiology staff to accommodate case today and case has been rescheduled  for Thursday at [place]. [facility]  Pager: [phone]

## 2021-02-03 ENCOUNTER — Other Ambulatory Visit: Payer: Self-pay | Admitting: Urology

## 2021-02-03 DIAGNOSIS — C61 Malignant neoplasm of prostate: Secondary | ICD-10-CM

## 2021-02-04 ENCOUNTER — Encounter (HOSPITAL_COMMUNITY)
Admission: RE | Disposition: A | Discharge status: Home / Self Care | Payer: Self-pay | Source: Home / Self Care | Attending: Urology

## 2021-02-04 ENCOUNTER — Encounter (HOSPITAL_COMMUNITY): Payer: Self-pay | Admitting: Urology

## 2021-02-04 ENCOUNTER — Ambulatory Visit (HOSPITAL_COMMUNITY): Payer: BC Managed Care – PPO | Admitting: Anesthesiology

## 2021-02-04 ENCOUNTER — Ambulatory Visit (HOSPITAL_COMMUNITY): Payer: BC Managed Care – PPO

## 2021-02-04 ENCOUNTER — Ambulatory Visit (HOSPITAL_COMMUNITY)
Admission: RE | Admit: 2021-02-04 | Discharge: 2021-02-04 | Disposition: A | Discharge status: Home / Self Care | Payer: BC Managed Care – PPO | Attending: Urology | Admitting: Urology

## 2021-02-04 DIAGNOSIS — Z87891 Personal history of nicotine dependence: Secondary | ICD-10-CM | POA: Insufficient documentation

## 2021-02-04 DIAGNOSIS — R350 Frequency of micturition: Secondary | ICD-10-CM | POA: Diagnosis not present

## 2021-02-04 DIAGNOSIS — Z79899 Other long term (current) drug therapy: Secondary | ICD-10-CM | POA: Insufficient documentation

## 2021-02-04 DIAGNOSIS — C61 Malignant neoplasm of prostate: Secondary | ICD-10-CM | POA: Insufficient documentation

## 2021-02-04 DIAGNOSIS — Z7984 Long term (current) use of oral hypoglycemic drugs: Secondary | ICD-10-CM | POA: Diagnosis not present

## 2021-02-04 DIAGNOSIS — E119 Type 2 diabetes mellitus without complications: Secondary | ICD-10-CM | POA: Insufficient documentation

## 2021-02-04 DIAGNOSIS — I509 Heart failure, unspecified: Secondary | ICD-10-CM | POA: Diagnosis not present

## 2021-02-04 DIAGNOSIS — N401 Enlarged prostate with lower urinary tract symptoms: Secondary | ICD-10-CM | POA: Diagnosis not present

## 2021-02-04 DIAGNOSIS — I110 Hypertensive heart disease with heart failure: Secondary | ICD-10-CM | POA: Diagnosis not present

## 2021-02-04 HISTORY — PX: SPACE OAR INSTILLATION: SHX6769

## 2021-02-04 HISTORY — PX: RADIOACTIVE SEED IMPLANT: SHX5150

## 2021-02-04 LAB — GLUCOSE, CAPILLARY
Glucose-Capillary: 152 mg/dL — ABNORMAL HIGH (ref 70–99)
Glucose-Capillary: 133 mg/dL — ABNORMAL HIGH (ref 70–99)

## 2021-02-04 LAB — COMPREHENSIVE METABOLIC PANEL
ALT: 14 U/L (ref 0–44)
AST: 14 U/L — ABNORMAL LOW (ref 15–41)
Albumin: 3.8 g/dL (ref 3.5–5.0)
Alkaline Phosphatase: 50 U/L (ref 38–126)
Anion gap: 6 (ref 5–15)
BUN: 14 mg/dL (ref 8–23)
CO2: 27 mmol/L (ref 22–32)
Calcium: 9.7 mg/dL (ref 8.9–10.3)
Chloride: 109 mmol/L (ref 98–111)
Creatinine, Ser: 0.84 mg/dL (ref 0.61–1.24)
GFR, Estimated: >60 mL/min (ref >60)
Glucose, Bld: 166 mg/dL — ABNORMAL HIGH (ref 70–99)
Potassium: 4.2 mmol/L (ref 3.5–5.1)
Sodium: 142 mmol/L (ref 135–145)
Total Bilirubin: 0.5 mg/dL (ref 0.3–1.2)
Total Protein: 7.5 g/dL (ref 6.5–8.1)

## 2021-02-04 LAB — APTT: aPTT: 28 seconds (ref 24–36)

## 2021-02-04 LAB — CBC WITH DIFFERENTIAL/PLATELET
Abs Immature Granulocytes: 0.02 10*3/uL (ref 0.00–0.07)
Basophils Absolute: 0 10*3/uL (ref 0.0–0.1)
Basophils Relative: 0 %
Eosinophils Absolute: 0 10*3/uL (ref 0.0–0.5)
Eosinophils Relative: 0 %
HCT: 37.8 % — ABNORMAL LOW (ref 39.0–52.0)
Hemoglobin: 13.1 g/dL (ref 13.0–17.0)
Immature Granulocytes: 0 %
Lymphocytes Relative: 27 %
Lymphs Abs: 2.6 10*3/uL (ref 0.7–4.0)
MCH: 30.4 pg (ref 26.0–34.0)
MCHC: 34.7 g/dL (ref 30.0–36.0)
MCV: 87.7 fL (ref 80.0–100.0)
Monocytes Absolute: 0.6 10*3/uL (ref 0.1–1.0)
Monocytes Relative: 7 %
Neutro Abs: 6.3 10*3/uL (ref 1.7–7.7)
Neutrophils Relative %: 66 %
Platelets: 294 10*3/uL (ref 150–400)
RBC: 4.31 MIL/uL (ref 4.22–5.81)
RDW: 12.2 % (ref 11.5–15.5)
WBC: 9.6 10*3/uL (ref 4.0–10.5)
nRBC: 0 % (ref 0.0–0.2)

## 2021-02-04 LAB — PROTIME-INR
INR: 1 (ref 0.8–1.2)
Prothrombin Time: 13.2 seconds (ref 11.4–15.2)

## 2021-02-04 SURGERY — INSERTION, RADIATION SOURCE, PROSTATE
Anesthesia: General | Site: Prostate

## 2021-02-04 MED ORDER — OXYCODONE HCL 5 MG/5ML PO SOLN: 5.0000 mg | Freq: Once | ORAL | Status: DC | PRN

## 2021-02-04 MED ORDER — OXYCODONE-ACETAMINOPHEN 5-325 MG PO TABS: 1.0000 | ORAL_TABLET | ORAL | 0 refills | Status: DC | PRN

## 2021-02-04 MED ORDER — ACETAMINOPHEN 160 MG/5ML PO SOLN: 325.0000 mg | ORAL | Status: DC | PRN

## 2021-02-04 MED ORDER — LIDOCAINE HCL (PF) 2 % IJ SOLN
INTRAMUSCULAR | Status: AC
  Filled 2021-02-04: qty 5

## 2021-02-04 MED ORDER — OXYCODONE HCL 5 MG PO TABS: 5.0000 mg | ORAL_TABLET | Freq: Once | ORAL | Status: DC | PRN

## 2021-02-04 MED ORDER — ONDANSETRON HCL 4 MG/2ML IJ SOLN
INTRAMUSCULAR | Status: DC | PRN
  Administered 2021-02-04: 4 mg via INTRAVENOUS

## 2021-02-04 MED ORDER — FENTANYL CITRATE (PF) 100 MCG/2ML IJ SOLN
INTRAMUSCULAR | Status: AC
  Filled 2021-02-04: qty 2

## 2021-02-04 MED ORDER — FENTANYL CITRATE PF 50 MCG/ML IJ SOSY: 25.0000 ug | PREFILLED_SYRINGE | INTRAMUSCULAR | Status: DC | PRN

## 2021-02-04 MED ORDER — LACTATED RINGERS IV SOLN: INTRAVENOUS | Status: DC

## 2021-02-04 MED ORDER — PHENYLEPHRINE 40 MCG/ML (10ML) SYRINGE FOR IV PUSH (FOR BLOOD PRESSURE SUPPORT)
PREFILLED_SYRINGE | INTRAVENOUS | Status: AC
  Filled 2021-02-04: qty 10

## 2021-02-04 MED ORDER — ONDANSETRON HCL 4 MG/2ML IJ SOLN
INTRAMUSCULAR | Status: AC
  Filled 2021-02-04: qty 2

## 2021-02-04 MED ORDER — DOCUSATE SODIUM 100 MG PO CAPS: 100.0000 mg | ORAL_CAPSULE | Freq: Every day | ORAL | 0 refills | Status: DC | PRN

## 2021-02-04 MED ORDER — CHLORHEXIDINE GLUCONATE 0.12 % MT SOLN
15.0000 mL | Freq: Once | OROMUCOSAL | Status: AC
  Administered 2021-02-04: 15 mL via OROMUCOSAL

## 2021-02-04 MED ORDER — ACETAMINOPHEN 325 MG PO TABS: 325.0000 mg | ORAL_TABLET | ORAL | Status: DC | PRN

## 2021-02-04 MED ORDER — ONDANSETRON HCL 4 MG/2ML IJ SOLN: 4.0000 mg | Freq: Once | INTRAMUSCULAR | Status: DC | PRN

## 2021-02-04 MED ORDER — SODIUM CHLORIDE (PF) 0.9 % IJ SOLN
INTRAMUSCULAR | Status: AC
  Filled 2021-02-04: qty 10

## 2021-02-04 MED ORDER — MEPERIDINE HCL 50 MG/ML IJ SOLN: 6.2500 mg | INTRAMUSCULAR | Status: DC | PRN

## 2021-02-04 MED ORDER — PROPOFOL 10 MG/ML IV BOLUS
INTRAVENOUS | Status: DC | PRN
  Administered 2021-02-04: 150 mg via INTRAVENOUS

## 2021-02-04 MED ORDER — CIPROFLOXACIN IN D5W 400 MG/200ML IV SOLN
400.0000 mg | Freq: Once | INTRAVENOUS | Status: AC
  Administered 2021-02-04: 400 mg via INTRAVENOUS
  Filled 2021-02-04: qty 200

## 2021-02-04 MED ORDER — PROPOFOL 10 MG/ML IV BOLUS
INTRAVENOUS | Status: AC
  Filled 2021-02-04: qty 20

## 2021-02-04 MED ORDER — IOPAMIDOL (ISOVUE-300) INJECTION 61%
INTRAVENOUS | Status: DC | PRN
  Administered 2021-02-04: 10 mL

## 2021-02-04 MED ORDER — OXYBUTYNIN CHLORIDE ER 10 MG PO TB24: 10.0000 mg | ORAL_TABLET | Freq: Every day | ORAL | 0 refills | Status: AC

## 2021-02-04 MED ORDER — PHENYLEPHRINE 40 MCG/ML (10ML) SYRINGE FOR IV PUSH (FOR BLOOD PRESSURE SUPPORT)
PREFILLED_SYRINGE | INTRAVENOUS | Status: DC | PRN
  Administered 2021-02-04: 80 ug via INTRAVENOUS
  Administered 2021-02-04 (×2): 120 ug via INTRAVENOUS
  Administered 2021-02-04: 80 ug via INTRAVENOUS

## 2021-02-04 MED ORDER — FENTANYL CITRATE (PF) 100 MCG/2ML IJ SOLN
INTRAMUSCULAR | Status: DC | PRN
  Administered 2021-02-04 (×2): 25 ug via INTRAVENOUS
  Administered 2021-02-04: 50 ug via INTRAVENOUS
  Administered 2021-02-04: 100 ug via INTRAVENOUS

## 2021-02-04 MED ORDER — LIDOCAINE 2% (20 MG/ML) 5 ML SYRINGE
INTRAMUSCULAR | Status: DC | PRN
  Administered 2021-02-04: 80 mg via INTRAVENOUS

## 2021-02-04 MED ORDER — STERILE WATER FOR IRRIGATION IR SOLN
Status: DC | PRN
  Administered 2021-02-04: 20 mL
  Administered 2021-02-04: 3000 mL

## 2021-02-04 SURGICAL SUPPLY — 27 items
BAG COUNTER SPONGE SURGICOUNT (BAG) IMPLANT
BAG URINE DRAIN 2000ML AR STRL (UROLOGICAL SUPPLIES) ×2 IMPLANT
CATH FOLEY 2WAY SLVR  5CC 16FR (CATHETERS) ×2
CATH FOLEY 2WAY SLVR 5CC 16FR (CATHETERS) ×2 IMPLANT
CATH ROBINSON RED A/P 20FR (CATHETERS) ×2 IMPLANT
CNTNR URN SCR LID CUP LEK RST (MISCELLANEOUS) ×2 IMPLANT
CONT SPEC 4OZ STRL OR WHT (MISCELLANEOUS) ×2
COVER MAYO STAND STRL (DRAPES) ×2 IMPLANT
DRAPE U-SHAPE 47X51 STRL (DRAPES) ×2 IMPLANT
DRSG TEGADERM 4X4.75 (GAUZE/BANDAGES/DRESSINGS) ×4 IMPLANT
DRSG TEGADERM 8X12 (GAUZE/BANDAGES/DRESSINGS) ×4 IMPLANT
GLOVE SURG ENC MOIS LTX SZ7.5 (GLOVE) ×2 IMPLANT
GLOVE SURG ENC TEXT LTX SZ7.5 (GLOVE) ×4 IMPLANT
GLOVE SURG NEOPR MICRO LF SZ8 (GLOVE) ×2 IMPLANT
GOWN STRL REUS W/TWL LRG LVL3 (GOWN DISPOSABLE) ×4 IMPLANT
HOLDER FOLEY CATH W/STRAP (MISCELLANEOUS) ×2 IMPLANT
I-Seed AgX100 ×2 IMPLANT
IMPL SPACEOAR VUE SYSTEM (Spacer) ×1 IMPLANT
IMPLANT SPACEOAR VUE SYSTEM (Spacer) ×2 IMPLANT
KIT TURNOVER KIT A (KITS) ×2 IMPLANT
MARKER SKIN DUAL TIP RULER LAB (MISCELLANEOUS) ×2 IMPLANT
PACK CYSTO (CUSTOM PROCEDURE TRAY) ×2 IMPLANT
PENCIL SMOKE EVACUATOR (MISCELLANEOUS) IMPLANT
SURGILUBE 2OZ TUBE FLIPTOP (MISCELLANEOUS) ×2 IMPLANT
SYR 10ML LL (SYRINGE) ×2 IMPLANT
TOWEL OR 17X26 10 PK STRL BLUE (TOWEL DISPOSABLE) ×2 IMPLANT
UNDERPAD 30X36 HEAVY ABSORB (UNDERPADS AND DIAPERS) ×4 IMPLANT

## 2021-02-04 NOTE — Transfer of Care (Signed)
Immediate Anesthesia Transfer of Care Note  Patient: Derek Thompson  Procedure(s) Performed: RADIOACTIVE SEED IMPLANT/BRACHYTHERAPY IMPLANT (Prostate) SPACE OAR INSTILLATION (Prostate)  Patient Location: PACU  Anesthesia Type:General  Level of Consciousness: drowsy and patient cooperative  Airway & Oxygen Therapy: Patient Spontanous Breathing and Patient connected to face mask oxygen  Post-op Assessment: Report given to RN and Post -op Vital signs reviewed and stable  Post vital signs: Reviewed and stable  Last Vitals:  Vitals Value Taken Time  BP 149/88 02/04/21 1502  Temp    Pulse 80 02/04/21 1504  Resp 6 02/04/21 1504  SpO2 100 % 02/04/21 1504  Vitals shown include unvalidated device data.  Last Pain:  Vitals:   02/04/21 1114  TempSrc:   PainSc: 0-No pain         Complications: No notable events documented.

## 2021-02-04 NOTE — Discharge Instructions (Signed)
You may remove gauze bandage tomorrow

## 2021-02-04 NOTE — Op Note (Signed)
PATIENT:  Derek Thompson  PRE-OPERATIVE DIAGNOSIS:  Adenocarcinoma of the prostate  POST-OPERATIVE DIAGNOSIS:  Same  PROCEDURE:  1. I-125 radioactive seed implantation 2. Cystoscopy  3. Placement of SpaceOAR  SURGEON:  Rexene Alberts, MD  Radiation oncologist: Tyler Pita, MD  ANESTHESIA:  General  EBL:  Minimal  DRAINS: None  INDICATION: [name]  Description of procedure: After informed consent the patient was brought to the major OR, placed on the table and administered general anesthesia. He was then moved to the modified lithotomy position with his perineum perpendicular to the floor. His perineum and genitalia were then sterilely prepped. An official timeout was then performed. A 16 French Foley catheter was then placed in the bladder and filled with dilute contrast, a rectal tube was placed in the rectum and the transrectal ultrasound probe was placed in the rectum and affixed to the stand. He was then sterilely draped.  Real time ultrasonography was used along with the seed planning software. This was used to develop the seed plan including the number of needles as well as number of seeds required for complete and adequate coverage. Real-time ultrasonography was then used along with the previously developed plan and the Nucletron device to implant a total of 59 seeds using 19 needles. This proceeded without difficulty or complication.   I then proceeded with placement of SpaceOAR by introducing a needle with the bevel angled inferiorly approximately 2 cm superior to the anus. This was angled downward and under direct ultrasound was placed within the space between the prostatic capsule and rectum. This was confirmed with a small amount of sterile saline injected and this was performed under direct ultrasound. I then attached the SpaceOAR to the needle and injected this in the space between the prostate and rectum with good placement noted.  A Foley catheter was then removed  as well as the transrectal ultrasound probe and rectal probe. Flexible cystoscopy was then performed using the 16 French flexible scope which revealed a normal urethra throughout its length down to the sphincter which appeared intact. The prostatic urethra revealed bilobar hypertrophy but no evidence of obstruction, seeds, spacers or lesions. The bladder was then entered and fully and systematically inspected. The ureteral orifices were noted to be of normal configuration and position. The mucosa revealed no evidence of tumors. There were also no stones identified within the bladder. I noted no seeds or spacers on the floor of the bladder and retroflexion of the scope revealed no seeds protruding from the base of the prostate.  The cystoscope was then removed and the patient was awakened and taken to recovery room in stable and satisfactory condition. He tolerated procedure well and there were no intraoperative complications.  [facility]  Pager: [phone]

## 2021-02-04 NOTE — Progress Notes (Signed)
Radiation Oncology         [phone] ________________________________   Name: Derek Thompson   MRN: [id number]         Date: 02/01/2021                      DOB: [birth year]        Prostate Seed Implant   SE:[id number], [name], MD  No ref. provider found   DIAGNOSIS: 60 y.o. 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      Oncology History  Malignant neoplasm of prostate ([place])  08/18/2020 Cancer Staging    Staging form: Prostate, AJCC 8th Edition - Clinical stage from 08/18/2020: Stage IVA (cT3b, cN1, cM0, PSA: 33.1, Grade Group: 5) - Signed by Freeman Caldron, PA-C on 10/13/2020 Histopathologic type: Adenocarcinoma, NOS Stage prefix: Initial diagnosis Prostate specific antigen (PSA) range: 20 or greater Gleason primary pattern: 5 Gleason secondary pattern: 4 Gleason score: 9 Histologic grading system: 5 grade system Number of biopsy cores examined: 12 Number of biopsy cores positive: 12 Location of positive needle core biopsies: Both sides    09/15/2020 Initial Diagnosis    Malignant neoplasm of prostate ([facility])      Genetic Testing    Negative genetic testing:  No pathogenic variants detected on the [facility] Common Hereditary Cancers panel + Prostate Cancer HRR panel. The report date is 10/12/2020.    The Common Hereditary Cancers Panel offered by [facility] includes sequencing and/or deletion duplication testing of the following 47 genes: APC, ATM, AXIN2, BARD1, BMPR1A, BRCA1, BRCA2, BRIP1, CDH1, CDK4, CDKN2A (p14ARF), CDKN2A (p16INK4a), CHEK2, CTNNA1, DICER1, EPCAM (Deletion/duplication testing only), GREM1 (promoter region deletion/duplication testing only), KIT, MEN1, MLH1, MSH2, MSH3, MSH6, MUTYH, NBN, NF1, NTHL1, PALB2, PDGFRA, PMS2, POLD1, POLE, PTEN, RAD50, RAD51C, RAD51D, SDHB, SDHC, SDHD, SMAD4, SMARCA4. STK11, TP53, TSC1, TSC2, and VHL. The following genes were evaluated for sequence changes only: SDHA and HOXB13  c.251G>A variant only. The Prostate Cancer HRR Panel offered by [facility] includes sequencing and/or deletion duplication testing of the following 10 genes: ATM, BARD1, BRCA1, BRCA2, BRIP1, CHEK2, FANCL, PALB2, RAD51C, RAD51D.         No diagnosis found.   PROCEDURE: Insertion of radioactive I-125 seeds into the prostate gland.   RADIATION DOSE: 110 Gy, boost therapy.   TECHNIQUE: Coben Godshall was brought to the operating room with the urologist. He was placed in the dorsolithotomy position. He was catheterized and a rectal tube was inserted. The perineum was shaved, prepped and draped. The ultrasound probe was then introduced into the rectum to see the prostate gland.   TREATMENT DEVICE: A needle grid was attached to the ultrasound probe stand and anchor needles were placed.   3D PLANNING: The prostate was imaged in 3D using a sagittal sweep of the prostate probe. These images were transferred to the planning computer. There, the prostate, urethra and rectum were defined on each axial reconstructed image. Then, the software created an optimized 3D plan and a few seed positions were adjusted. The quality of the plan was reviewed using [facility] information for the target and the following two organs at risk:  Urethra and Rectum.  Then the accepted plan was printed and handed off to the radiation therapist.  Under my supervision, the custom loading of the seeds and spacers was carried out and loaded into sealed vicryl sleeves.  These pre-loaded needles were then placed into the needle holder.Marland Kitchen   PROSTATE  VOLUME STUDY:  Using transrectal ultrasound the volume of the prostate was verified to be 34 cc.   SPECIAL TREATMENT PROCEDURE/SUPERVISION AND HANDLING: The pre-loaded needles were then delivered under sagittal guidance. A total of 19 needles were used to deposit 59 seeds in the prostate gland. The individual seed activity was 0.352 mCi.   SpaceOAR:  Yes   COMPLEX SIMULATION: At the end of the  procedure, an anterior radiograph of the pelvis was obtained to document seed positioning and count. Cystoscopy was performed to check the urethra and bladder.   MICRODOSIMETRY: At the end of the procedure, the patient was emitting 0.045 mR/hr at 1 meter. Accordingly, he was considered safe for hospital discharge.   PLAN: The patient will return to the radiation oncology clinic for post implant CT dosimetry in three weeks.     ________________________________ Sheral Apley Tammi Klippel, M.D.

## 2021-02-04 NOTE — H&P (Signed)
Office Visit Report     10/20/2020    --------------------------------------------------------------------------------   Derek Thompson  MRN: [id number]  DOB: 11-20-1960, 60 year old Male  SSN:    PRIMARY CARE:  Benito Mccreedy, MD  REFERRING:  Benito Mccreedy, MD  PROVIDER:  Rexene Alberts, M.D.  LOCATION:  [facility] [phone]      --------------------------------------------------------------------------------   CC/HPI: Derek Thompson is a 60 year old male seen in f/u with prostate cancer.    He has met with Dr. Tammi Klippel and has elected to proceed with EBRT plus brachii plus ADT.    He is followed by Dr. Doristine Section Bonsu, PCP, and PSA obtained on 05/21/2020 resulted 39.9 NG/mL. He has noticed some weight loss over the past couple of months. He has stable appetite. He states his father was diagnosed with prostate cancer and underwent radiation therapy.    Patient underwent prostate biopsy on 08/18/2020 for an elevated PSA of 39.9 ng/mL. Biopsy revealed GS 5+ 4 = 9 in 12/12 cores, adenocarcinoma of the prostate with 12/12 cores positive (90-95%), TRUS volume of 41cm3. Denies new or worsening bone or back pain. Good appetite. He has had some weight loss over the past several months, he estimates about 30 pounds however has been gaining this back.    Family history: Father, treated with radiation therapy  Imaging studies: PSMA PET scan 09/10/2020 with activity in the periphery of the prostate gland extending into the seminal vesicles, 3 small foci of intense radiotracer activity in the pelvis involving the presacral space and left perirectal fat without discrete lesions on CT scan concerning for subtle CT occult nodal metastasis    PMH: CHF, HTN, DM  PSH: None    TNM stage: HO1YY4M2  PSA: 39.9 on 05/21/2020  Gleason score: GS 5+ 4 = 9  Biopsy: 08/18/2020  Left: All cores 5+4=9  Right: All cores 5+4=9  Prostate volume: 41cm3  PSAD: 0.97    Nomogram  CSS (5  year, 10 year): 97, 93  PFS (5 year, 10 year): 13, 7  EPE: 97  LNI: 71  SVI:57    IPSS: 21, 4. On tamsulosin 0.$RemoveBeforeD'4mg'AUrPFZPidtIoXE$ .  SHIM: 9      ALLERGIES: NKDA     MEDICATIONS: Aspirin 81 mg tablet,chewable  Metformin Hcl 500 mg tablet  Metoprolol Succinate 25 mg tablet, extended release 24 hr  Tamsulosin Hcl 0.4 mg capsule 1 capsule PO Q HS  Atorvastatin Calcium 40 mg tablet  Blood Glucose Monitoring kit  Entresto 24 mg-26 mg tablet  Farxiga 10 mg tablet  Flax Seed Oil  Glipizide 5 mg tablet  Ondansetron Hcl 8 mg tablet  Polyethylene Glycol  Spironolactone 25 mg tablet  Vitamin B Complex  Vitamin C  Vitamin E      GU PSH: Prostate Needle Biopsy - 08/18/2020       NON-GU PSH: Surgical Pathology, Gross And Microscopic Examination For Prostate Needle - 08/18/2020       GU PMH: BPH w/LUTS - 09/17/2020, - 08/18/2020, - 07/16/2020 Prostate Cancer - 09/17/2020, - 08/25/2020 Urinary Frequency - 09/17/2020, - 08/25/2020, - 08/18/2020, - 07/16/2020 Weak Urinary Stream - 09/17/2020, - 08/25/2020, - 07/16/2020 Elevated PSA - 08/18/2020, - 07/16/2020 Incomplete bladder emptying - 08/18/2020, - 07/16/2020     NON-GU PMH: Diabetes Type 2 Heart disease, unspecified Hypertension     FAMILY HISTORY: 3 Son's - Runs in Family    SOCIAL HISTORY: Marital Status: Single Ethnicity: Not Hispanic Or Latino; Race: Black or African American  Current Smoking Status: Patient does not smoke anymore. Has not smoked since 06/30/2017. Smoked for 30 years. Smoked 1 pack per day.    Tobacco Use Assessment Completed: Used Tobacco in last 30 days? Has never drank.  Drinks 1 caffeinated drink per day.     REVIEW OF SYSTEMS:    GU Review Male:   Patient denies frequent urination, hard to postpone urination, burning/ pain with urination, get up at night to urinate, leakage of urine, stream starts and stops, trouble starting your stream, have to strain to urinate , erection problems, and penile pain.  Gastrointestinal (Upper):    Patient denies nausea, vomiting, and indigestion/ heartburn.  Gastrointestinal (Lower):   Patient denies diarrhea and constipation.  Constitutional:   Patient denies fever, night sweats, weight loss, and fatigue.  Skin:   Patient denies skin rash/ lesion and itching.  Eyes:   Patient denies blurred vision and double vision.  Ears/ Nose/ Throat:   Patient denies sore throat and sinus problems.  Hematologic/Lymphatic:   Patient denies swollen glands and easy bruising.  Cardiovascular:   Patient denies leg swelling and chest pains.  Respiratory:   Patient denies cough and shortness of breath.  Endocrine:   Patient denies excessive thirst.  Musculoskeletal:   Patient denies back pain and joint pain.  Neurological:   Patient denies headaches and dizziness.  Psychologic:   Patient denies depression and anxiety.    VITAL SIGNS: None    MULTI-SYSTEM PHYSICAL EXAMINATION:    Constitutional: Well-nourished. No physical deformities. Normally developed. Good grooming.  Respiratory: No labored breathing, no use of accessory muscles.   Cardiovascular: Normal temperature, normal extremity pulses, no swelling, no varicosities.  Gastrointestinal: No mass, no tenderness, no rigidity, non obese abdomen.      Complexity of Data:  Source Of History:  Patient, Medical Record Summary  Lab Test Review:   PSA  Records Review:   AUA Symptom Score, Pathology Reports, Previous Doctor Records  Urine Test Review:   Urinalysis   07/16/20  PSA  Total PSA 33.10 ng/mL      PROCEDURES:           Urinalysis - 81003 Dipstick Dipstick Cont'd  Color: Yellow Bilirubin: Neg  Appearance: Clear Ketones: Neg  Specific Gravity: 1.025 Blood: Neg  pH: 6.0 Protein: Neg  Glucose: 2+ Urobilinogen: 0.2    Nitrites: Neg    Leukocyte Esterase: Neg      Notes:               Eligard $Remove'45mg'fVUyztt$ / 6 Month - X9217, [id number] The arm/ abdomen/ hip/ thigh was sterilely prepped with alcohol. Eligard was injected subcutaneously ([place])  using standard technique. The patient tolerated the procedure well. A band aid was applied (not sure if this is necessary for a [place] injection). The site was dry when the patient left the exam room. The patient will return as scheduled.   After treatment was administered, patient was observed without any adverse reactions noted.     Qty: 45 Adm. By: Rene Paci  Unit: mg Lot No [id number]  Route: SQ Exp. Date 05/02/2022  Freq: None Mfgr.:   Site: Right Buttock    ASSESSMENT:      ICD-10 Details  1 GU:   Prostate Cancer - C61   2   BPH w/LUTS - N40.1   3   Urinary Frequency - R35.0     PLAN:            Schedule Labs: 6 Months - PSA  Return Visit/Planned Activity: 6 Months - Office Visit             Note: Eligard 45mg             Document Letter(s):  Created for Patient: Clinical Summary           Notes:    1. 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 11/10/2020 and has elected proceed with EBRT plus brachii plus ADT.  -45 mg Eligard given today. Will be due for another injection in 6 months (04/2021)  -Green sheet has already been submitted for brachytherapy with SpaceOAR. Discussed risk and benefits as below.      Brachytherapy/space OAR consent- The patient was counseled about the natural history of prostate cancer and the standard treatment options that are available for prostate cancer. It was explained to him how his age and life expectancy, clinical stage, Gleason score, and PSA affect his prognosis, the decision to proceed with additional staging studies, as well as how that information influences recommended treatment strategies. We discussed the roles for active surveillance, radiation  therapy, surgical therapy, androgen deprivation, as well as ablative therapy options for the treatment of prostate cancer as appropriate to his individual cancer situation. We discussed the risks and benefits of these options with regard to their impact on cancer control and also in terms of potential adverse events, complications, and impact on quality of life particularly related to urinary and sexual function. The patient was encouraged to ask questions throughout the discussion today and all questions were answered to his stated satisfaction. In addition, the patient was provided with and/or directed to appropriate resources and literature for further education about prostate cancer and treatment options.    The patient has decided to proceed with brachytherapy and SpaceOAR placement as primary treatment of his intermediate risk prostate cancer. The risks, benefits and alternatives of the aforementioned procedures was discussed in detail. Risks include, bur are not limited to worsening LUTS, erectile dysfunction, rectal irritation, urethral stricture formation, fistula formation, cancer recurrence, MI, CVA, PE, DVT and the inherent risk of general anesthesia. He voices understanding and wishes to proceed.      Discussion:  Using NCCN risk stratification criteria, his disease is considered very high risk and even regional risk when considering his PET scan. We discussed his diagnosis in detail, reviewing the significance of gleason score, PSA, DRE, and percentage of cores positive. We discussed the various management options for prostate cancer, including active surveillance, open and robotic radical prostatectomy, EBRT, brachytherapy, proton therapy, and cryotherapy. We discussed the generally indolent course of many prostate cancers, and the generally favorable oncologic control offered by all treatment strategies. However, I emphasized that all treatments offer only potential for cure and that in many  cases multimodal therapy may ultimately be utilized for long term cancer control. We also discussed the impact of treatment on sexual and urinary function. I emphasized that each treatment has unique quality of life impact and recovery profiles, and we reviewed the possible effects of surgery, radiation, and cryotherapy on quality of life outcomes. All questions were answered.    2. LUTS: Severe IPSS score 21. Discussed risk benefits of treatment. Continue tamsulosin 0.4 mg.  CC: Benito Mccreedy, MD         Next Appointment:      Next Appointment: 04/13/2021 01:45 PM    Appointment Type: Laboratory Appointment    Location: [facility] [phone]    Provider: Lab LAB    Reason for Visit: 6 mnths psa-Noa Galvao    Urology Preoperative H&P    Chief Complaint: Prostate cancer   History of Present Illness: Jamesmichael Goetze is a 60 y.o. male with prostate cancer here for brachytherapy. Denies fevers or chills.         Past Medical History:  Diagnosis Date   Anemia     Diabetes mellitus without complication ([place])     Family history of breast cancer     Hypertension     Myocardial infarction [facility])     Prostate cancer [facility])             Past Surgical History:  Procedure Laterality Date   DENTAL SURGERY       RIGHT/LEFT HEART CATH AND CORONARY ANGIOGRAPHY N/A 05/25/2020    Procedure: RIGHT/LEFT HEART CATH AND CORONARY ANGIOGRAPHY;  Surgeon: Martinique, Peter M, MD;  Location: [facility];  Service: Cardiovascular;  Laterality: N/A;      Allergies: No Known Allergies        Family History  Problem Relation Age of Onset   Breast cancer Mother          dx 30s/40s, twice   Cancer Maternal Grandfather          unknown type, d. >50      Social History:  reports that he quit smoking about 3 years ago. His smoking use included cigarettes. He has a 43.00 pack-year smoking history. He has never used smokeless tobacco. He reports that he does not currently use alcohol. He  reports that he does not currently use drugs.   ROS: A complete review of systems was performed.  All systems are negative except for pertinent findings as noted.   Physical Exam:  Vital signs in last 24 hours: Temp:  [98.6 F (37 C)] 98.6 F (37 C) (10/03 1010) Pulse Rate:  [81] 81 (10/03 1010) Resp:  [16] 16 (10/03 1010) BP: (167)/(82) 167/82 (10/03 1010) SpO2:  [100 %] 100 % (10/03 1010) Weight:  [81.6 kg] 81.6 kg (10/03 1010) Constitutional:  Alert and oriented, No acute distress Cardiovascular: Regular rate and rhythm Respiratory: Normal respiratory effort, Lungs clear bilaterally GI: Abdomen is soft, nontender, nondistended, no abdominal masses GU: No CVA tenderness Lymphatic: No lymphadenopathy Neurologic: Grossly intact, no focal deficits Psychiatric: Normal mood and affect   Laboratory Data:  Recent Labs (last 2 labs)   No results for input(s): WBC, HGB, HCT, PLT in the last 72 hours.      Recent Labs (last 2 labs)   No results for input(s): NA, K, CL, GLUCOSE, BUN, CALCIUM, CREATININE in the last 72 hours.   Invalid input(s): CO3       Lab Results Last 24 Hours       Results for orders placed or performed during the hospital encounter of 02/01/21 (from the past 24 hour(s))  Glucose, capillary     Status: Abnormal    Collection Time: 02/01/21 10:10 AM  Result Value Ref Range    Glucose-Capillary 220 (H) 70 - 99 mg/dL      No results found for this or any previous visit (from the past 240 hour(s)).   Renal Function: No results for  input(s): CREATININE in the last 168 hours. Estimated Creatinine Clearance: 106.4 mL/min (by C-G formula based on SCr of 0.66 mg/dL).   Radiologic Imaging: Imaging Results (Last 48 hours)  No results found.     I independently reviewed the above imaging studies.   Assessment and Plan Daymien Pangborn is a 60 y.o. male with prostate cancer here for brachytherapy. Ok to proceed.   Brachytherapy/space OAR consent- The patient  was counseled about the natural history of prostate cancer and the standard treatment options that are available for prostate cancer. It was explained to him how his age and life expectancy, clinical stage, Gleason score, and PSA affect his prognosis, the decision to proceed with additional staging studies, as well as how that information influences recommended treatment strategies. We discussed the roles for active surveillance, radiation therapy, surgical therapy, androgen deprivation, as well as ablative therapy options for the treatment of prostate cancer as appropriate to his individual cancer situation. We discussed the risks and benefits of these options with regard to their impact on cancer control and also in terms of potential adverse events, complications, and impact on quality of life particularly related to urinary and sexual function. The patient was encouraged to ask questions throughout the discussion today and all questions were answered to his stated satisfaction. In addition, the patient was provided with and/or directed to appropriate resources and literature for further education about prostate cancer and treatment options.    The patient has decided to proceed with brachytherapy and SpaceOAR placement as primary treatment of his intermediate risk prostate cancer. The risks, benefits and alternatives of the aforementioned procedures was discussed in detail. Risks include, bur are not limited to worsening LUTS, erectile dysfunction, rectal irritation, urethral stricture formation, fistula formation, cancer recurrence, MI, CVA, PE, DVT and the inherent risk of general anesthesia. He voices understanding and wishes to proceed.        Matt R. Jerra Huckeby MD 02/01/2021, 12:21 PM  [facility] Pager: [phone]): [phone]

## 2021-02-04 NOTE — Anesthesia Procedure Notes (Signed)
Procedure Name: LMA Insertion Date/Time: 02/04/2021 1:13 PM Performed by: [place] Dalessandro Baldyga C, CRNA Pre-anesthesia Checklist: Patient identified, Emergency Drugs available, Suction available and Patient being monitored Patient Re-evaluated:Patient Re-evaluated prior to induction Oxygen Delivery Method: Circle system utilized Preoxygenation: Pre-oxygenation with 100% oxygen Induction Type: IV induction Ventilation: Mask ventilation without difficulty LMA: LMA inserted LMA Size: 5.0 Number of attempts: 1 Airway Equipment and Method: Bite block Placement Confirmation: positive ETCO2 Tube secured with: Tape Dental Injury: Teeth and Oropharynx as per pre-operative assessment

## 2021-02-04 NOTE — Anesthesia Preprocedure Evaluation (Addendum)
Anesthesia Evaluation  Patient identified by MRN, date of birth, ID band Patient awake    Reviewed: Allergy & Precautions, H&P , NPO status , Patient's Chart, lab work & pertinent test results, reviewed documented beta blocker date and time   Airway Mallampati: II  TM Distance: >3 FB Neck ROM: full    Dental no notable dental hx. (+) Teeth Intact, Dental Advisory Given   Pulmonary neg pulmonary ROS, former smoker,    Pulmonary exam normal breath sounds clear to auscultation       Cardiovascular Exercise Tolerance: Good hypertension, Pt. on medications and Pt. on home beta blockers + CAD, + Past MI and +CHF   Rhythm:regular Rate:Normal  CATH 1/22 Prox RCA to Mid RCA lesion is 60% stenosed. Dist RCA lesion is 50% stenosed. Ost LAD to Mid LAD lesion is 35% stenosed. 1st Diag lesion is 30% stenosed. Dist LAD lesion is 50% stenosed. Ramus lesion is 80% stenosed. Ost Cx to Prox Cx lesion is 50% stenosed. LV end diastolic pressure is normal.   1. Single vessel obstructive CAD involving a ramus intermediate branch 2. Normal right heart pressures. PAP mean 11 mm Hg 3. Low LV filling pressures. PCWP 5 mm Hg.  4. Preserved cardiac output with index 2.79.     Neuro/Psych negative neurological ROS  negative psych ROS   GI/Hepatic negative GI ROS, Neg liver ROS,   Endo/Other  diabetes, Type 2  Renal/GU negative Renal ROS  negative genitourinary   Musculoskeletal   Abdominal   Peds  Hematology negative hematology ROS (+) anemia ,   Anesthesia Other Findings   Reproductive/Obstetrics negative OB ROS                            Anesthesia Physical Anesthesia Plan  ASA: 3  Anesthesia Plan: General   Post-op Pain Management:    Induction: Intravenous  PONV Risk Score and Plan: 2 and Ondansetron, Treatment may vary due to age or medical condition and Dexamethasone  Airway Management Planned:  Oral ETT and LMA  Additional Equipment:   Intra-op Plan:   Post-operative Plan:   Informed Consent: I have reviewed the patients History and Physical, chart, labs and discussed the procedure including the risks, benefits and alternatives for the proposed anesthesia with the patient or authorized representative who has indicated his/her understanding and acceptance.     Dental Advisory Given  Plan Discussed with: CRNA and Anesthesiologist  Anesthesia Plan Comments:         Anesthesia Quick Evaluation

## 2021-02-04 NOTE — Anesthesia Postprocedure Evaluation (Signed)
Anesthesia Post Note  Patient: Derek Thompson  Procedure(s) Performed: RADIOACTIVE SEED IMPLANT/BRACHYTHERAPY IMPLANT (Prostate) SPACE OAR INSTILLATION (Prostate)     Patient location during evaluation: PACU Anesthesia Type: General Level of consciousness: awake and alert and oriented Pain management: pain level controlled Vital Signs Assessment: post-procedure vital signs reviewed and stable Respiratory status: spontaneous breathing, nonlabored ventilation and respiratory function stable Cardiovascular status: blood pressure returned to baseline and stable Postop Assessment: no apparent nausea or vomiting Anesthetic complications: no   No notable events documented.  Last Vitals:  Vitals:   02/04/21 1600 02/04/21 1615  BP: (!) 155/69 (!) 147/73  Pulse: 67 67  Resp:  16  Temp:    SpO2: 99% 100%    Last Pain:  Vitals:   02/04/21 1615  TempSrc:   PainSc: 0-No pain                 George Haggart A.

## 2021-02-09 ENCOUNTER — Other Ambulatory Visit: Payer: Self-pay

## 2021-02-09 ENCOUNTER — Ambulatory Visit (HOSPITAL_COMMUNITY): Payer: BC Managed Care – PPO | Attending: Cardiovascular Disease

## 2021-02-09 DIAGNOSIS — I5042 Chronic combined systolic (congestive) and diastolic (congestive) heart failure: Secondary | ICD-10-CM | POA: Diagnosis not present

## 2021-02-09 DIAGNOSIS — I428 Other cardiomyopathies: Secondary | ICD-10-CM | POA: Diagnosis not present

## 2021-02-09 LAB — ECHOCARDIOGRAM COMPLETE
Area-P 1/2: 4.31 cm2
S' Lateral: 4.2 cm

## 2021-02-11 ENCOUNTER — Encounter (HOSPITAL_COMMUNITY): Payer: Self-pay | Admitting: Urology

## 2021-02-17 ENCOUNTER — Encounter (HOSPITAL_COMMUNITY): Payer: Self-pay | Admitting: Urology

## 2021-02-17 ENCOUNTER — Telehealth: Payer: Self-pay | Admitting: *Deleted

## 2021-02-17 NOTE — Telephone Encounter (Signed)
CALLED PATIENT TO REMIND OF SIM APPT. FOR 02-18-21- ARRIVAL TIME- 10:15 AM @ [facility], LVM FOR A RETURN CALL

## 2021-02-18 ENCOUNTER — Other Ambulatory Visit: Payer: Self-pay

## 2021-02-18 ENCOUNTER — Ambulatory Visit
Admission: RE | Admit: 2021-02-18 | Discharge: 2021-02-18 | Disposition: A | Discharge status: Home / Self Care | Payer: BC Managed Care – PPO | Source: Ambulatory Visit | Attending: Radiation Oncology | Admitting: Radiation Oncology

## 2021-02-18 DIAGNOSIS — Z510 Encounter for antineoplastic radiation therapy: Secondary | ICD-10-CM | POA: Diagnosis not present

## 2021-02-18 DIAGNOSIS — C61 Malignant neoplasm of prostate: Secondary | ICD-10-CM | POA: Diagnosis not present

## 2021-02-18 NOTE — Progress Notes (Addendum)
  Radiation Oncology         [phone]) [phone] ________________________________  Name: Derek Thompson MRN: [id number]  Date: 02/18/2021  DOB: 1961-01-10  SIMULATION AND TREATMENT PLANNING NOTE    ICD-10-CM   1. Malignant neoplasm of prostate ([place])  C61       DIAGNOSIS:  60 y.o. 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  NARRATIVE:  The patient was brought to the [facility].  Identity was confirmed.  All relevant records and images related to the planned course of therapy were reviewed.  The patient freely provided informed written consent to proceed with treatment after reviewing the details related to the planned course of therapy. The consent form was witnessed and verified by the simulation staff.  Then, the patient was set-up in a stable reproducible supine position for radiation therapy.  A vacuum lock pillow device was custom fabricated to position his legs in a reproducible immobilized position.  Then, I performed a urethrogram under sterile conditions to identify the prostatic apex.  CT images were obtained.  Surface markings were placed.  The CT images were loaded into the planning software.  Then the prostate target and avoidance structures including the rectum, bladder, bowel and hips were contoured.  Treatment planning then occurred.  The radiation prescription was entered and confirmed.  A total of one complex treatment devices were fabricated. I have requested : Intensity Modulated Radiotherapy (IMRT) is medically necessary for this case for the following reason:  Rectal sparing.Marland Kitchen  PLAN:  The patient will receive 45 Gy in 25 fractions of 1.8 Gy, to supplement an up-front prostate seed implant boost of 110 Gy to achieve a total nominal dose of 155 Gy.  The involved pelvic lymph nodes in the presacral space and left perirectal fat will be treated to 60 Gy in 15  fractions of 2.4 Gy using simultaneous integrated boost (SIB).  ________________________________  Sheral Apley Tammi Klippel, M.D.

## 2021-02-24 DIAGNOSIS — C61 Malignant neoplasm of prostate: Secondary | ICD-10-CM | POA: Diagnosis not present

## 2021-02-24 NOTE — Addendum Note (Signed)
Encounter addended by: Tyler Pita, MD on: 02/24/2021 12:11 PM  Actions taken: Medication List reviewed, Problem List reviewed, Allergies reviewed, Clinical Note Signed

## 2021-02-25 ENCOUNTER — Ambulatory Visit
Admission: RE | Admit: 2021-02-25 | Discharge: 2021-02-25 | Disposition: A | Discharge status: Home / Self Care | Payer: BC Managed Care – PPO | Source: Ambulatory Visit | Attending: Radiation Oncology | Admitting: Radiation Oncology

## 2021-02-25 ENCOUNTER — Other Ambulatory Visit: Payer: Self-pay

## 2021-02-25 DIAGNOSIS — C61 Malignant neoplasm of prostate: Secondary | ICD-10-CM | POA: Diagnosis not present

## 2021-02-26 ENCOUNTER — Ambulatory Visit
Admission: RE | Admit: 2021-02-26 | Discharge: 2021-02-26 | Disposition: A | Discharge status: Home / Self Care | Payer: BC Managed Care – PPO | Source: Ambulatory Visit | Attending: Radiation Oncology | Admitting: Radiation Oncology

## 2021-02-26 DIAGNOSIS — C61 Malignant neoplasm of prostate: Secondary | ICD-10-CM | POA: Diagnosis not present

## 2021-03-01 ENCOUNTER — Other Ambulatory Visit: Payer: Self-pay

## 2021-03-01 ENCOUNTER — Ambulatory Visit
Admission: RE | Admit: 2021-03-01 | Discharge: 2021-03-01 | Disposition: A | Discharge status: Home / Self Care | Payer: BC Managed Care – PPO | Source: Ambulatory Visit | Attending: Radiation Oncology | Admitting: Radiation Oncology

## 2021-03-01 DIAGNOSIS — C61 Malignant neoplasm of prostate: Secondary | ICD-10-CM | POA: Diagnosis not present

## 2021-03-02 ENCOUNTER — Ambulatory Visit
Admission: RE | Admit: 2021-03-02 | Discharge: 2021-03-02 | Disposition: A | Discharge status: Home / Self Care | Payer: BC Managed Care – PPO | Source: Ambulatory Visit | Attending: Radiation Oncology | Admitting: Radiation Oncology

## 2021-03-02 ENCOUNTER — Other Ambulatory Visit: Payer: Self-pay

## 2021-03-02 DIAGNOSIS — C61 Malignant neoplasm of prostate: Secondary | ICD-10-CM | POA: Diagnosis present

## 2021-03-02 DIAGNOSIS — Z510 Encounter for antineoplastic radiation therapy: Secondary | ICD-10-CM | POA: Insufficient documentation

## 2021-03-03 ENCOUNTER — Ambulatory Visit
Admission: RE | Admit: 2021-03-03 | Discharge: 2021-03-03 | Disposition: A | Discharge status: Home / Self Care | Payer: BC Managed Care – PPO | Source: Ambulatory Visit | Attending: Radiation Oncology | Admitting: Radiation Oncology

## 2021-03-03 ENCOUNTER — Other Ambulatory Visit: Payer: Self-pay

## 2021-03-03 DIAGNOSIS — Z510 Encounter for antineoplastic radiation therapy: Secondary | ICD-10-CM | POA: Diagnosis not present

## 2021-03-03 DIAGNOSIS — C61 Malignant neoplasm of prostate: Secondary | ICD-10-CM | POA: Diagnosis not present

## 2021-03-04 ENCOUNTER — Ambulatory Visit
Admission: RE | Admit: 2021-03-04 | Discharge: 2021-03-04 | Disposition: A | Discharge status: Home / Self Care | Payer: BC Managed Care – PPO | Source: Ambulatory Visit | Attending: Radiation Oncology | Admitting: Radiation Oncology

## 2021-03-04 DIAGNOSIS — C61 Malignant neoplasm of prostate: Secondary | ICD-10-CM | POA: Diagnosis not present

## 2021-03-04 DIAGNOSIS — Z510 Encounter for antineoplastic radiation therapy: Secondary | ICD-10-CM | POA: Diagnosis not present

## 2021-03-05 ENCOUNTER — Encounter: Payer: Self-pay | Admitting: Radiation Oncology

## 2021-03-05 ENCOUNTER — Ambulatory Visit
Admission: RE | Admit: 2021-03-05 | Discharge: 2021-03-05 | Disposition: A | Discharge status: Home / Self Care | Payer: BC Managed Care – PPO | Source: Ambulatory Visit | Attending: Radiation Oncology | Admitting: Radiation Oncology

## 2021-03-05 ENCOUNTER — Other Ambulatory Visit: Payer: Self-pay

## 2021-03-05 DIAGNOSIS — Z510 Encounter for antineoplastic radiation therapy: Secondary | ICD-10-CM | POA: Diagnosis not present

## 2021-03-05 DIAGNOSIS — C61 Malignant neoplasm of prostate: Secondary | ICD-10-CM | POA: Diagnosis not present

## 2021-03-08 ENCOUNTER — Other Ambulatory Visit: Payer: Self-pay | Admitting: Cardiovascular Disease

## 2021-03-08 ENCOUNTER — Ambulatory Visit
Admission: RE | Admit: 2021-03-08 | Discharge: 2021-03-08 | Disposition: A | Discharge status: Home / Self Care | Payer: BC Managed Care – PPO | Source: Ambulatory Visit | Attending: Radiation Oncology | Admitting: Radiation Oncology

## 2021-03-08 DIAGNOSIS — Z510 Encounter for antineoplastic radiation therapy: Secondary | ICD-10-CM | POA: Diagnosis not present

## 2021-03-08 DIAGNOSIS — C61 Malignant neoplasm of prostate: Secondary | ICD-10-CM | POA: Diagnosis not present

## 2021-03-09 ENCOUNTER — Other Ambulatory Visit: Payer: Self-pay

## 2021-03-09 ENCOUNTER — Ambulatory Visit
Admission: RE | Admit: 2021-03-09 | Discharge: 2021-03-09 | Disposition: A | Discharge status: Home / Self Care | Payer: BC Managed Care – PPO | Source: Ambulatory Visit | Attending: Radiation Oncology | Admitting: Radiation Oncology

## 2021-03-09 DIAGNOSIS — C61 Malignant neoplasm of prostate: Secondary | ICD-10-CM | POA: Diagnosis not present

## 2021-03-09 DIAGNOSIS — Z510 Encounter for antineoplastic radiation therapy: Secondary | ICD-10-CM | POA: Diagnosis not present

## 2021-03-10 ENCOUNTER — Ambulatory Visit
Admission: RE | Admit: 2021-03-10 | Discharge: 2021-03-10 | Disposition: A | Discharge status: Home / Self Care | Payer: BC Managed Care – PPO | Source: Ambulatory Visit | Attending: Radiation Oncology | Admitting: Radiation Oncology

## 2021-03-10 DIAGNOSIS — Z510 Encounter for antineoplastic radiation therapy: Secondary | ICD-10-CM | POA: Diagnosis not present

## 2021-03-10 DIAGNOSIS — C61 Malignant neoplasm of prostate: Secondary | ICD-10-CM | POA: Diagnosis not present

## 2021-03-11 ENCOUNTER — Other Ambulatory Visit: Payer: Self-pay

## 2021-03-11 ENCOUNTER — Ambulatory Visit
Admission: RE | Admit: 2021-03-11 | Discharge: 2021-03-11 | Disposition: A | Discharge status: Home / Self Care | Payer: BC Managed Care – PPO | Source: Ambulatory Visit | Attending: Radiation Oncology | Admitting: Radiation Oncology

## 2021-03-11 DIAGNOSIS — C61 Malignant neoplasm of prostate: Secondary | ICD-10-CM | POA: Diagnosis not present

## 2021-03-11 DIAGNOSIS — Z510 Encounter for antineoplastic radiation therapy: Secondary | ICD-10-CM | POA: Diagnosis not present

## 2021-03-12 ENCOUNTER — Ambulatory Visit
Admission: RE | Admit: 2021-03-12 | Discharge: 2021-03-12 | Disposition: A | Discharge status: Home / Self Care | Payer: BC Managed Care – PPO | Source: Ambulatory Visit | Attending: Radiation Oncology | Admitting: Radiation Oncology

## 2021-03-12 DIAGNOSIS — Z510 Encounter for antineoplastic radiation therapy: Secondary | ICD-10-CM | POA: Diagnosis not present

## 2021-03-12 DIAGNOSIS — C61 Malignant neoplasm of prostate: Secondary | ICD-10-CM | POA: Diagnosis not present

## 2021-03-15 ENCOUNTER — Ambulatory Visit
Admission: RE | Admit: 2021-03-15 | Discharge: 2021-03-15 | Disposition: A | Discharge status: Home / Self Care | Payer: BC Managed Care – PPO | Source: Ambulatory Visit | Attending: Radiation Oncology | Admitting: Radiation Oncology

## 2021-03-15 ENCOUNTER — Other Ambulatory Visit: Payer: Self-pay

## 2021-03-15 DIAGNOSIS — Z510 Encounter for antineoplastic radiation therapy: Secondary | ICD-10-CM | POA: Diagnosis not present

## 2021-03-15 DIAGNOSIS — C61 Malignant neoplasm of prostate: Secondary | ICD-10-CM | POA: Diagnosis not present

## 2021-03-16 ENCOUNTER — Ambulatory Visit
Admission: RE | Admit: 2021-03-16 | Discharge: 2021-03-16 | Disposition: A | Discharge status: Home / Self Care | Payer: BC Managed Care – PPO | Source: Ambulatory Visit | Attending: Radiation Oncology | Admitting: Radiation Oncology

## 2021-03-16 DIAGNOSIS — Z510 Encounter for antineoplastic radiation therapy: Secondary | ICD-10-CM | POA: Diagnosis not present

## 2021-03-16 DIAGNOSIS — C61 Malignant neoplasm of prostate: Secondary | ICD-10-CM | POA: Diagnosis not present

## 2021-03-17 ENCOUNTER — Other Ambulatory Visit: Payer: Self-pay

## 2021-03-17 ENCOUNTER — Ambulatory Visit
Admission: RE | Admit: 2021-03-17 | Discharge: 2021-03-17 | Disposition: A | Discharge status: Home / Self Care | Payer: BC Managed Care – PPO | Source: Ambulatory Visit | Attending: Radiation Oncology | Admitting: Radiation Oncology

## 2021-03-17 DIAGNOSIS — Z510 Encounter for antineoplastic radiation therapy: Secondary | ICD-10-CM | POA: Diagnosis not present

## 2021-03-17 DIAGNOSIS — C61 Malignant neoplasm of prostate: Secondary | ICD-10-CM | POA: Diagnosis not present

## 2021-03-18 ENCOUNTER — Ambulatory Visit
Admission: RE | Admit: 2021-03-18 | Discharge: 2021-03-18 | Disposition: A | Discharge status: Home / Self Care | Payer: BC Managed Care – PPO | Source: Ambulatory Visit | Attending: Radiation Oncology | Admitting: Radiation Oncology

## 2021-03-18 DIAGNOSIS — C61 Malignant neoplasm of prostate: Secondary | ICD-10-CM | POA: Diagnosis not present

## 2021-03-18 DIAGNOSIS — Z510 Encounter for antineoplastic radiation therapy: Secondary | ICD-10-CM | POA: Diagnosis not present

## 2021-03-19 ENCOUNTER — Other Ambulatory Visit: Payer: Self-pay

## 2021-03-19 ENCOUNTER — Ambulatory Visit
Admission: RE | Admit: 2021-03-19 | Discharge: 2021-03-19 | Disposition: A | Discharge status: Home / Self Care | Payer: BC Managed Care – PPO | Source: Ambulatory Visit | Attending: Radiation Oncology | Admitting: Radiation Oncology

## 2021-03-19 DIAGNOSIS — Z510 Encounter for antineoplastic radiation therapy: Secondary | ICD-10-CM | POA: Diagnosis not present

## 2021-03-19 DIAGNOSIS — C61 Malignant neoplasm of prostate: Secondary | ICD-10-CM | POA: Diagnosis not present

## 2021-03-21 ENCOUNTER — Ambulatory Visit
Admission: RE | Admit: 2021-03-21 | Discharge: 2021-03-21 | Disposition: A | Discharge status: Home / Self Care | Payer: BC Managed Care – PPO | Source: Ambulatory Visit | Attending: Radiation Oncology | Admitting: Radiation Oncology

## 2021-03-21 DIAGNOSIS — C61 Malignant neoplasm of prostate: Secondary | ICD-10-CM | POA: Diagnosis not present

## 2021-03-21 DIAGNOSIS — Z510 Encounter for antineoplastic radiation therapy: Secondary | ICD-10-CM | POA: Diagnosis not present

## 2021-03-22 ENCOUNTER — Ambulatory Visit
Admission: RE | Admit: 2021-03-22 | Discharge: 2021-03-22 | Disposition: A | Discharge status: Home / Self Care | Payer: BC Managed Care – PPO | Source: Ambulatory Visit | Attending: Radiation Oncology | Admitting: Radiation Oncology

## 2021-03-22 ENCOUNTER — Other Ambulatory Visit: Payer: Self-pay

## 2021-03-22 DIAGNOSIS — C61 Malignant neoplasm of prostate: Secondary | ICD-10-CM | POA: Diagnosis not present

## 2021-03-22 DIAGNOSIS — Z510 Encounter for antineoplastic radiation therapy: Secondary | ICD-10-CM | POA: Diagnosis not present

## 2021-03-23 ENCOUNTER — Ambulatory Visit (INDEPENDENT_AMBULATORY_CARE_PROVIDER_SITE_OTHER): Payer: BC Managed Care – PPO | Admitting: Internal Medicine

## 2021-03-23 ENCOUNTER — Ambulatory Visit
Admission: RE | Admit: 2021-03-23 | Discharge: 2021-03-23 | Disposition: A | Discharge status: Home / Self Care | Payer: BC Managed Care – PPO | Source: Ambulatory Visit | Attending: Radiation Oncology | Admitting: Radiation Oncology

## 2021-03-23 ENCOUNTER — Encounter: Payer: Self-pay | Admitting: Internal Medicine

## 2021-03-23 VITALS — BP 130/68 | HR 64 | Ht 72.0 in | Wt 192.0 lb

## 2021-03-23 DIAGNOSIS — I5042 Chronic combined systolic (congestive) and diastolic (congestive) heart failure: Secondary | ICD-10-CM | POA: Diagnosis not present

## 2021-03-23 DIAGNOSIS — E785 Hyperlipidemia, unspecified: Secondary | ICD-10-CM | POA: Diagnosis not present

## 2021-03-23 DIAGNOSIS — I2510 Atherosclerotic heart disease of native coronary artery without angina pectoris: Secondary | ICD-10-CM | POA: Diagnosis not present

## 2021-03-23 DIAGNOSIS — C61 Malignant neoplasm of prostate: Secondary | ICD-10-CM | POA: Diagnosis not present

## 2021-03-23 DIAGNOSIS — Z510 Encounter for antineoplastic radiation therapy: Secondary | ICD-10-CM | POA: Diagnosis not present

## 2021-03-23 DIAGNOSIS — I428 Other cardiomyopathies: Secondary | ICD-10-CM

## 2021-03-23 MED ORDER — SACUBITRIL-VALSARTAN 49-51 MG PO TABS: 1.0000 | ORAL_TABLET | Freq: Two times a day (BID) | ORAL | 3 refills | Status: DC

## 2021-03-23 NOTE — Progress Notes (Signed)
OFFICE NOTE  Chief Complaint:  Follow-up heart failure  Primary Care Physician: Derek Plum, MD  HPI:  Derek Thompson is a 60 y.o. male with a past medial history significant for congestive heart failure and nonischemic cardiomyopathy diagnosed in January 2022.  He was hospitalized with this, diuresed and underwent left and right heart catheterization which showed 80% stenosis of the ramus vessel as well as some mild to moderate nonobstructive coronary disease which was out of proportion to his cardiomyopathy.  LVEF at the time was 25 to 30%.  He was placed on guideline directed medical therapy.  He recently saw Derek Fabian, NP in October who had ordered a repeat echo that showed an improvement in LVEF up to 45 to 50%.  Overall Derek Thompson is feeling better although does feel fatigued.  He was unfortunately diagnosed with a prostate cancer.  He is undergone radiation therapy and has his last radiation treatment on December 1.  Today blood pressure is 130/68.  He denied any chest pain or worsening shortness of breath.  PMHx:  Past Medical History:  Diagnosis Date   Anemia    Diabetes mellitus without complication (HCC)    Family history of breast cancer    Hypertension    Myocardial infarction [facility])    Prostate cancer [facility])     Past Surgical History:  Procedure Laterality Date   DENTAL SURGERY     RADIOACTIVE SEED IMPLANT N/A 02/04/2021   Procedure: RADIOACTIVE SEED IMPLANT/BRACHYTHERAPY IMPLANT;  Surgeon: Jannifer Hick, MD;  Location: [facility];  Service: Urology;  Laterality: N/A;   RIGHT/LEFT HEART CATH AND CORONARY ANGIOGRAPHY N/A 05/25/2020   Procedure: RIGHT/LEFT HEART CATH AND CORONARY ANGIOGRAPHY;  Surgeon: [place], Peter M, MD;  Location: [facility] INVASIVE CV LAB;  Service: Cardiovascular;  Laterality: N/A;   SPACE OAR INSTILLATION N/A 02/04/2021   Procedure: SPACE OAR INSTILLATION;  Surgeon: Jannifer Hick, MD;  Location: [facility];  Service: Urology;  Laterality: N/A;     FAMHx:  Family History  Problem Relation Age of Onset   Breast cancer Mother        dx 30s/40s, twice   Cancer Maternal Grandfather        unknown type, d. >50    SOCHx:   reports that he quit smoking about 3 years ago. His smoking use included cigarettes. He has a 43.00 pack-year smoking history. He has never used smokeless tobacco. He reports that he does not currently use alcohol. He reports that he does not currently use drugs.  ALLERGIES:  No Known Allergies  ROS: Pertinent items noted in HPI and remainder of comprehensive ROS otherwise negative.  HOME MEDS: Current Outpatient Medications on File Prior to Visit  Medication Sig Dispense Refill   Accu-Chek Softclix Lancets lancets SMARTSIG:Topical 1-4 Times Daily     aspirin 81 MG EC tablet TAKE 1 TABLET (81 MG TOTAL) BY MOUTH DAILY. SWALLOW WHOLE. (Patient taking differently: Take 81 mg by mouth daily. SWALLOW WHOLE.) 30 tablet 11   atorvastatin (LIPITOR) 40 MG tablet TAKE 1 TABLET (40 MG TOTAL) BY MOUTH DAILY. 30 tablet 0   blood glucose meter kit and supplies KIT Dispense based on patient and insurance preference. Use up to four times daily as directed. (FOR ICD-9 250.00, 250.01). 1 each 0   FARXIGA 10 MG TABS tablet TAKE 1 TABLET(10 MG) BY MOUTH DAILY (Patient taking differently: Take 10 mg by mouth daily.) 30 tablet 0   gabapentin (NEURONTIN) 300 MG capsule Take 300 mg by  mouth 2 (two) times daily.     metFORMIN (GLUCOPHAGE) 500 MG tablet Take 1 tablet (500 mg total) by mouth 2 (two) times daily with a meal. 60 tablet 0   metoprolol succinate (TOPROL-XL) 25 MG 24 hr tablet TAKE 1 TABLET(25 MG) BY MOUTH DAILY 90 tablet 3   Multiple Vitamin (MULTIVITAMIN) tablet Take 1 tablet by mouth daily.     spironolactone (ALDACTONE) 25 MG tablet TAKE 1/2 TABLET (12.5 MG TOTAL) BY MOUTH DAILY. 15 tablet 0   tamsulosin (FLOMAX) 0.4 MG CAPS capsule Take 0.4 mg by mouth at bedtime.     diclofenac Sodium (VOLTAREN) 1 % GEL Apply 2 g  topically 4 (four) times daily. (Patient not taking: Reported on 03/23/2021) 50 g 0   docusate sodium (COLACE) 100 MG capsule Take 1 capsule (100 mg total) by mouth daily as needed for up to 30 doses. (Patient not taking: Reported on 03/23/2021) 30 capsule 0   glipiZIDE (GLUCOTROL) 5 MG tablet TAKE 1 TABLET (5 MG TOTAL) BY MOUTH TWO TIMES DAILY. (Patient not taking: Reported on 03/23/2021) 60 tablet 0   oxyCODONE-acetaminophen (PERCOCET) 5-325 MG tablet Take 1 tablet by mouth every 4 (four) hours as needed for up to 12 doses for severe pain. (Patient not taking: Reported on 03/23/2021) 12 tablet 0   No current facility-administered medications on file prior to visit.    LABS/IMAGING: No results found for this or any previous visit (from the past 48 hour(s)). No results found.  LIPID PANEL:    Component Value Date/Time   CHOL 147 05/24/2020 0144   TRIG 94 05/24/2020 0144   HDL 31 (L) 05/24/2020 0144   CHOLHDL 4.7 05/24/2020 0144   VLDL 19 05/24/2020 0144   LDLCALC 97 05/24/2020 0144     WEIGHTS: Wt Readings from Last 3 Encounters:  03/23/21 192 lb (87.1 kg)  02/04/21 179 lb 14.3 oz (81.6 kg)  02/01/21 180 lb (81.6 kg)    VITALS: BP 130/68   Pulse 64   Ht 6' (1.829 m)   Wt 192 lb (87.1 kg)   SpO2 98%   BMI 26.04 kg/m   EXAM: General appearance: alert and no distress Neck: no carotid bruit, no JVD, and thyroid not enlarged, symmetric, no tenderness/mass/nodules Lungs: clear to auscultation bilaterally Heart: regular rate and rhythm, S1, S2 normal, no murmur, click, rub or gallop Abdomen: soft, non-tender; bowel sounds normal; no masses,  no organomegaly Extremities: extremities normal, atraumatic, no cyanosis or edema Pulses: 2+ and symmetric Skin: Skin color, texture, turgor normal. No rashes or lesions Neurologic: Grossly normal Psych: Pleasant  EKG: N/A  ASSESSMENT: Nonischemic cardiomyopathy, NYHA class II symptoms, LVEF 25 to 30%  -> improved to 45 to 50%  (01/2021) Mild to moderate nonobstructive coronary disease with significant small ramus branch stenosis, medically managed Dyslipidemia Type 2 diabetes Prostate cancer  PLAN: 1.   Mr. Severt has had improvement in LVEF up to 45 to 50%.  He is on adequate guideline directed medical therapy.  Blood pressure would allow further up titration of his Entresto which I would recommend today from the 24/26 mg twice daily dose to the 49/51 mg twice daily dose.  As he is scheduled to finish his radiation therapy in December, will allow his heart and body more time to recover.  We will plan a repeat echo in about 6 months and he can follow-up with me at that time.  Pixie Casino, MD, [facility], [place]  Director of the [place] of the American Board of Clinical Lipidology Attending Cardiologist  Direct Dial: [phone]  Fax: [phone]  Website:  [email or web address] 03/23/2021, 3:38 PM

## 2021-03-23 NOTE — Patient Instructions (Signed)
Medication Instructions:  INCREASE entresto to 49-51mg  twice daily -- OK to finish out current prescription and then start new prescription   *If you need a refill on your cardiac medications before your next appointment, please call your pharmacy*   Testing/Procedures: Echocardiogram at [facility] in 6 months -- [address] [employer]   Follow-Up: At [facility], you and your health needs are our priority.  As part of our continuing mission to provide you with exceptional heart care, we have created designated Provider Care Teams.  These Care Teams include your primary Cardiologist (physician) and Advanced Practice Providers (APPs -  Physician Assistants and Nurse Practitioners) who all work together to provide you with the care you need, when you need it.  We recommend signing up for the patient portal called "MyChart".  Sign up information is provided on this After Visit Summary.  MyChart is used to connect with patients for Virtual Visits (Telemedicine).  Patients are able to view lab/test results, encounter notes, upcoming appointments, etc.  Non-urgent messages can be sent to your provider as well.   To learn more about what you can do with MyChart, go to [email or web address].    Your next appointment:   6 month(s)  The format for your next appointment:   In Person  Provider:   Pixie Casino, MD {

## 2021-03-24 ENCOUNTER — Ambulatory Visit
Admission: RE | Admit: 2021-03-24 | Discharge: 2021-03-24 | Disposition: A | Discharge status: Home / Self Care | Payer: BC Managed Care – PPO | Source: Ambulatory Visit | Attending: Radiation Oncology | Admitting: Radiation Oncology

## 2021-03-24 ENCOUNTER — Ambulatory Visit: Payer: BC Managed Care – PPO

## 2021-03-24 DIAGNOSIS — Z510 Encounter for antineoplastic radiation therapy: Secondary | ICD-10-CM | POA: Diagnosis not present

## 2021-03-24 DIAGNOSIS — C61 Malignant neoplasm of prostate: Secondary | ICD-10-CM | POA: Diagnosis not present

## 2021-03-28 NOTE — Progress Notes (Signed)
  Radiation Oncology         [phone]) [phone] ________________________________  Name: Dontea Corlew MRN: [id number]  Date: 03/05/2021  DOB: February 13, 1961  3D Planning Note   Prostate Brachytherapy Post-Implant Dosimetry  Diagnosis: 60 y.o. 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  Narrative: On a previous date, Brien Lowe returned following prostate seed implantation for post implant planning. He underwent CT scan complex simulation to delineate the three-dimensional structures of the pelvis and demonstrate the radiation distribution.  Since that time, the seed localization, and complex isodose planning with dose volume histograms have now been completed.  Results:   Prostate Coverage - The dose of radiation delivered to the 90% or more of the prostate gland (D90) was 100.65% of the prescription dose. This exceeds our goal of greater than 90%. Rectal Sparing - The volume of rectal tissue receiving the prescription dose or higher was 0.0 cc. This falls under our thresholds tolerance of 1.0 cc.  Impression: The prostate seed implant appears to show adequate target coverage and appropriate rectal sparing.  Plan:  The patient will continue to follow with urology for ongoing PSA determinations. I would anticipate a high likelihood for local tumor control with minimal risk for rectal morbidity.  ________________________________  Sheral Apley Tammi Klippel, M.D.

## 2021-03-29 ENCOUNTER — Other Ambulatory Visit: Payer: Self-pay

## 2021-03-29 ENCOUNTER — Ambulatory Visit
Admission: RE | Admit: 2021-03-29 | Discharge: 2021-03-29 | Disposition: A | Discharge status: Home / Self Care | Payer: BC Managed Care – PPO | Source: Ambulatory Visit | Attending: Radiation Oncology | Admitting: Radiation Oncology

## 2021-03-29 DIAGNOSIS — C61 Malignant neoplasm of prostate: Secondary | ICD-10-CM | POA: Diagnosis not present

## 2021-03-29 DIAGNOSIS — Z510 Encounter for antineoplastic radiation therapy: Secondary | ICD-10-CM | POA: Diagnosis not present

## 2021-03-30 ENCOUNTER — Ambulatory Visit
Admission: RE | Admit: 2021-03-30 | Discharge: 2021-03-30 | Disposition: A | Discharge status: Home / Self Care | Payer: BC Managed Care – PPO | Source: Ambulatory Visit | Attending: Radiation Oncology | Admitting: Radiation Oncology

## 2021-03-30 DIAGNOSIS — Z510 Encounter for antineoplastic radiation therapy: Secondary | ICD-10-CM | POA: Diagnosis not present

## 2021-03-30 DIAGNOSIS — C61 Malignant neoplasm of prostate: Secondary | ICD-10-CM | POA: Diagnosis not present

## 2021-03-31 ENCOUNTER — Ambulatory Visit
Admission: RE | Admit: 2021-03-31 | Discharge: 2021-03-31 | Disposition: A | Discharge status: Home / Self Care | Payer: BC Managed Care – PPO | Source: Ambulatory Visit | Attending: Radiation Oncology | Admitting: Radiation Oncology

## 2021-03-31 ENCOUNTER — Other Ambulatory Visit: Payer: Self-pay

## 2021-03-31 DIAGNOSIS — C61 Malignant neoplasm of prostate: Secondary | ICD-10-CM | POA: Diagnosis not present

## 2021-03-31 DIAGNOSIS — Z510 Encounter for antineoplastic radiation therapy: Secondary | ICD-10-CM | POA: Diagnosis not present

## 2021-04-01 ENCOUNTER — Ambulatory Visit: Payer: BC Managed Care – PPO

## 2021-04-01 ENCOUNTER — Ambulatory Visit
Admission: RE | Admit: 2021-04-01 | Discharge: 2021-04-01 | Disposition: A | Discharge status: Home / Self Care | Payer: BC Managed Care – PPO | Source: Ambulatory Visit | Attending: Radiation Oncology | Admitting: Radiation Oncology

## 2021-04-01 DIAGNOSIS — Z510 Encounter for antineoplastic radiation therapy: Secondary | ICD-10-CM | POA: Insufficient documentation

## 2021-04-01 DIAGNOSIS — C61 Malignant neoplasm of prostate: Secondary | ICD-10-CM | POA: Insufficient documentation

## 2021-04-02 ENCOUNTER — Ambulatory Visit: Payer: BC Managed Care – PPO

## 2021-04-02 ENCOUNTER — Encounter: Payer: Self-pay | Admitting: Urology

## 2021-04-02 DIAGNOSIS — C61 Malignant neoplasm of prostate: Secondary | ICD-10-CM

## 2021-05-04 ENCOUNTER — Telehealth: Payer: Self-pay

## 2021-05-04 NOTE — Telephone Encounter (Signed)
Called patient X2 and left a friendly reminder of his 10:00am-05/05/21 telephone follow-up appointment w/ Ashlyn Bruning PA-C. I asked that patient return my call prior to this appointment time, and I left my extension [phone], so that we may complete the nursing portion of this appointment.

## 2021-05-05 ENCOUNTER — Encounter: Payer: Self-pay | Admitting: Urology

## 2021-05-05 ENCOUNTER — Telehealth: Payer: Self-pay | Admitting: Urology

## 2021-05-05 ENCOUNTER — Ambulatory Visit
Admission: RE | Admit: 2021-05-05 | Discharge: 2021-05-05 | Disposition: A | Discharge status: Home / Self Care | Payer: BC Managed Care – PPO | Source: Ambulatory Visit | Attending: Urology | Admitting: Urology

## 2021-05-05 DIAGNOSIS — C61 Malignant neoplasm of prostate: Secondary | ICD-10-CM

## 2021-05-05 NOTE — Progress Notes (Signed)
Spoke w/ patient's mom "Derek Thompson", who is cleared to speak on patient's behalf and identified w/ 2 identifiers. She states "Mr Kudrna says he is doing well". No symptoms reported at this time.  Meaningful use complete.  I-PSS score of 2 (mild). Currently on Flomax 0.4mg  daily. Urology follow-up w/ [facility] scheduled for June 2023.  Patient preferred contact # [phone]

## 2021-05-05 NOTE — Progress Notes (Signed)
°  Radiation Oncology         [phone]) [phone] ________________________________  Name: Derek Thompson MRN: [id number]  Date: 04/02/2021  DOB: 06-22-1960  End of Treatment Note  Diagnosis:   61 y.o. 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     Indication for treatment:  Curative, Definitive Radiotherapy       Radiation treatment dates:     02/04/21: Brachytherapy boost 02/25/21 - 04/01/21: IMRT prostate and pelvic nodes  Site/dose:  Insertion of radioactive I-125 seeds into the prostate gland;110 Gy, boost therapy.   2.  The prostate, seminal vesicles, and pelvic lymph    nodes were initially treated to 45 Gy in 25 fractions of 1.8 Gy   Beams/energy:  A total of 19 needles were used to deposit 59 seeds in the prostate gland. The individual seed activity was 0.352 mCi.   2. The prostate, seminal vesicles, and pelvic lymph nodes were initially treated using VMAT intensity modulated radiotherapy delivering 6 megavolt photons. Image guidance was performed with CB-CT studies prior to each fraction. He was immobilized with a body fix lower extremity mold.   Narrative: The patient tolerated radiation treatment relatively well with only minor urinary irritation and modest fatigue.  He reported nocturia x3, hesitancy, frequency and urgency.  He specifically denied dysuria, gross hematuria or incontinence.  He did also experience some diarrhea but did not require medication and had pretty much resolved spontaneously by the end of treatment.  Plan: The patient has completed radiation treatment. He will return to radiation oncology clinic for routine followup in one month. I advised him to call or return sooner if he has any questions or concerns related to his recovery or treatment. ________________________________  Sheral Apley. Tammi Klippel, M.D.

## 2021-05-05 NOTE — Telephone Encounter (Signed)
I attempted to reach the patient by telephone this morning to conduct his routine, 1 month follow-up visit but was forwarded straight to voicemail so I did leave a message, requesting he return my call at his earliest convenience.  Nicholos Johns, MMS, PA-C [facility] [place]: [phone]   Fax: [phone]

## 2021-06-17 ENCOUNTER — Telehealth: Payer: Self-pay

## 2021-06-17 NOTE — Telephone Encounter (Signed)
Called patient to inform him that disability paperwork has been signed by the doctor and turned in to claims.  He was appreciative and thanks everyone.

## 2021-06-18 ENCOUNTER — Telehealth: Payer: Self-pay

## 2021-06-18 NOTE — Telephone Encounter (Signed)
Notified Patient of completion of Short Term Disability Paperwork. Form e-mailed to Derek [email or web address] per Patient request. Copy placed in bin at Registration desk for pick-up as requested by Patient. No other needs voiced at this time.

## 2021-06-29 ENCOUNTER — Ambulatory Visit (HOSPITAL_COMMUNITY)
Admission: RE | Admit: 2021-06-29 | Discharge: 2021-06-29 | Disposition: A | Discharge status: Home / Self Care | Payer: BC Managed Care – PPO | Source: Ambulatory Visit | Attending: Cardiology | Admitting: Cardiology

## 2021-06-29 ENCOUNTER — Other Ambulatory Visit: Payer: Self-pay

## 2021-06-29 DIAGNOSIS — R55 Syncope and collapse: Secondary | ICD-10-CM | POA: Diagnosis present

## 2021-06-29 DIAGNOSIS — I428 Other cardiomyopathies: Secondary | ICD-10-CM | POA: Diagnosis present

## 2021-06-29 DIAGNOSIS — R0989 Other specified symptoms and signs involving the circulatory and respiratory systems: Secondary | ICD-10-CM

## 2021-06-29 DIAGNOSIS — I2510 Atherosclerotic heart disease of native coronary artery without angina pectoris: Secondary | ICD-10-CM

## 2021-06-29 DIAGNOSIS — E785 Hyperlipidemia, unspecified: Secondary | ICD-10-CM | POA: Diagnosis present

## 2021-06-29 DIAGNOSIS — E118 Type 2 diabetes mellitus with unspecified complications: Secondary | ICD-10-CM

## 2021-06-29 DIAGNOSIS — I10 Essential (primary) hypertension: Secondary | ICD-10-CM

## 2021-06-29 DIAGNOSIS — I5042 Chronic combined systolic (congestive) and diastolic (congestive) heart failure: Secondary | ICD-10-CM | POA: Diagnosis not present

## 2021-07-23 ENCOUNTER — Other Ambulatory Visit (HOSPITAL_COMMUNITY): Payer: Self-pay | Admitting: Cardiovascular Disease

## 2021-07-23 DIAGNOSIS — I6523 Occlusion and stenosis of bilateral carotid arteries: Secondary | ICD-10-CM

## 2021-08-17 ENCOUNTER — Telehealth: Payer: Self-pay

## 2021-08-17 ENCOUNTER — Telehealth: Payer: Self-pay | Admitting: *Deleted

## 2021-08-17 NOTE — Telephone Encounter (Signed)
Patient called needing a return to work letter.  Request to pick up tomorrow morning at check-in desk. ?

## 2021-08-17 NOTE — Telephone Encounter (Signed)
1045: Voicemail received from [place] with radiation oncology.  "Derek Thompson [phone])  ? came in to pick up FMLA/Disability paperwork and also has questions." ? ?1227: Derek Thompson called this nurse.  "The doctor has released me.  Given your number to connect to get a release from you to return to work.  No, I do not have or need a form, just need something from the doctor to return to work per employer." ? ?This nurse advised he clarify with employer of "Return to Work" paperwork which ask for abilities, restrictions, and limitations or if a letter or prescription only is needed to allow his return to work.     ?The form staff is glad to assist with completing paperwork allowing 7-10 business days/14 calendar, however we do not create or issue letters or prescriptions for disability, leave of absence or return to work. ? ?12:33: Call transferred to radiation oncologist collaborative nurse.    ?

## 2021-08-23 ENCOUNTER — Encounter (HOSPITAL_COMMUNITY): Payer: Self-pay | Admitting: Internal Medicine

## 2021-09-06 ENCOUNTER — Telehealth (HOSPITAL_COMMUNITY): Payer: Self-pay | Admitting: Internal Medicine

## 2021-09-06 NOTE — Telephone Encounter (Signed)
Just an FYI. ?We have made several attempts to contact this patient including sending a letter to schedule or reschedule their echocardiogram. We will be removing the patient from the echo WQ. ? ?08/23/21 MAILED LETTER [facility]  ?08/19/21 [facility] to schedule x 3 @ 12:49/[facility]W  ?08/16/21 LMOM @ 11:09 x 2 [facility]  ?08/09/21 LMOM @ 12:44/LBW ? ? ? ? ? ?Thank you  ?

## 2021-10-05 ENCOUNTER — Telehealth: Payer: Self-pay | Admitting: Internal Medicine

## 2021-10-05 NOTE — Telephone Encounter (Signed)
Called pt to confirm address due to getting some return mail, and also make appt with pt due to being due to see Dr.Hilty for his F/u Appt.

## 2021-10-12 IMAGING — CR DG KNEE COMPLETE 4+V*L*
4 series · 4 of 4 positions shown · non-contrast
Comparison: Same day contralateral knee radiographs

CLINICAL DATA: Fall, pain bilateral knees

EXAM:
LEFT KNEE - COMPLETE 4+ VIEW

[t knee ap left]
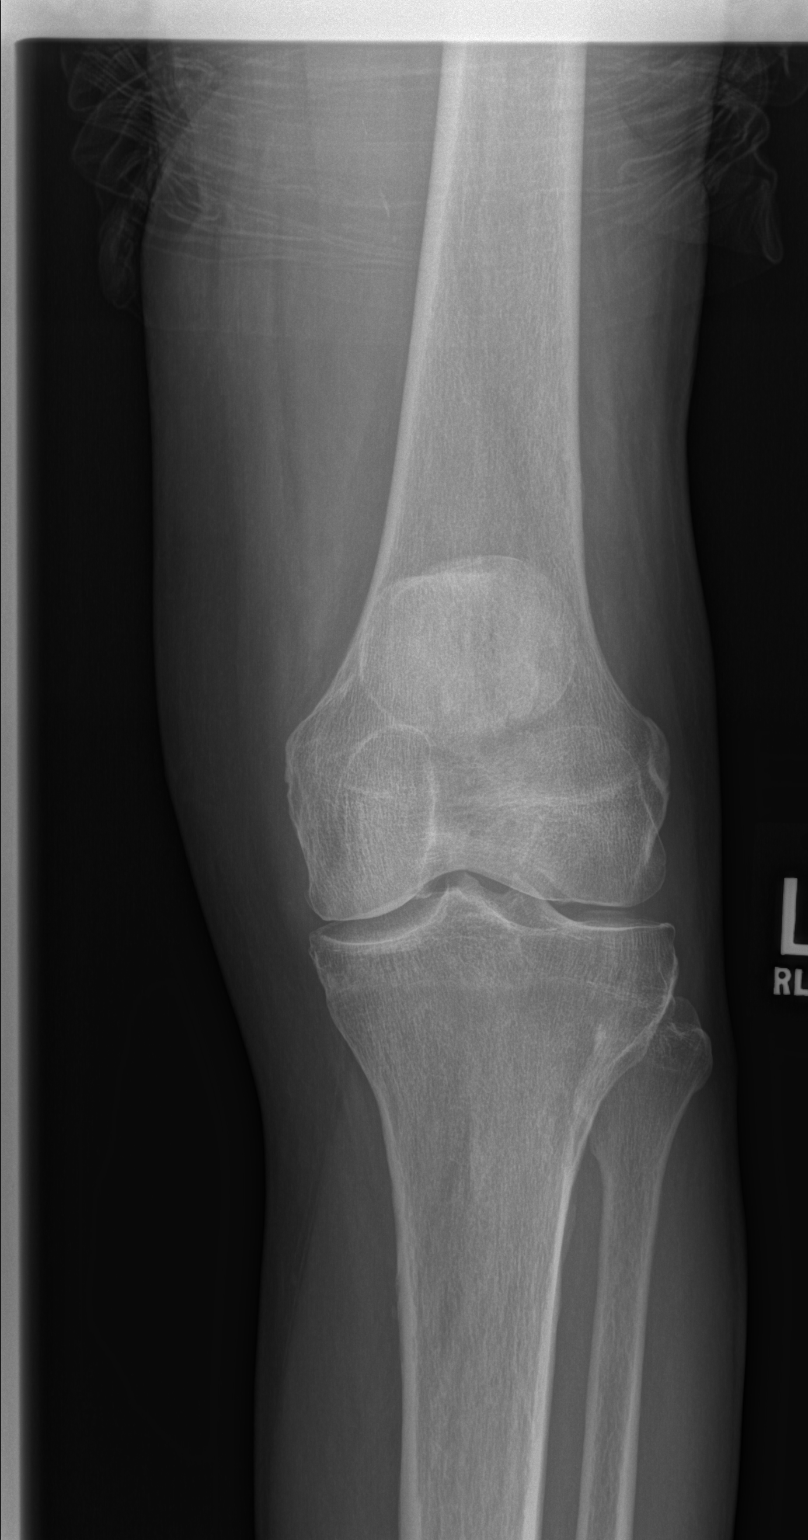

[t knee obl left (1 of 2)]
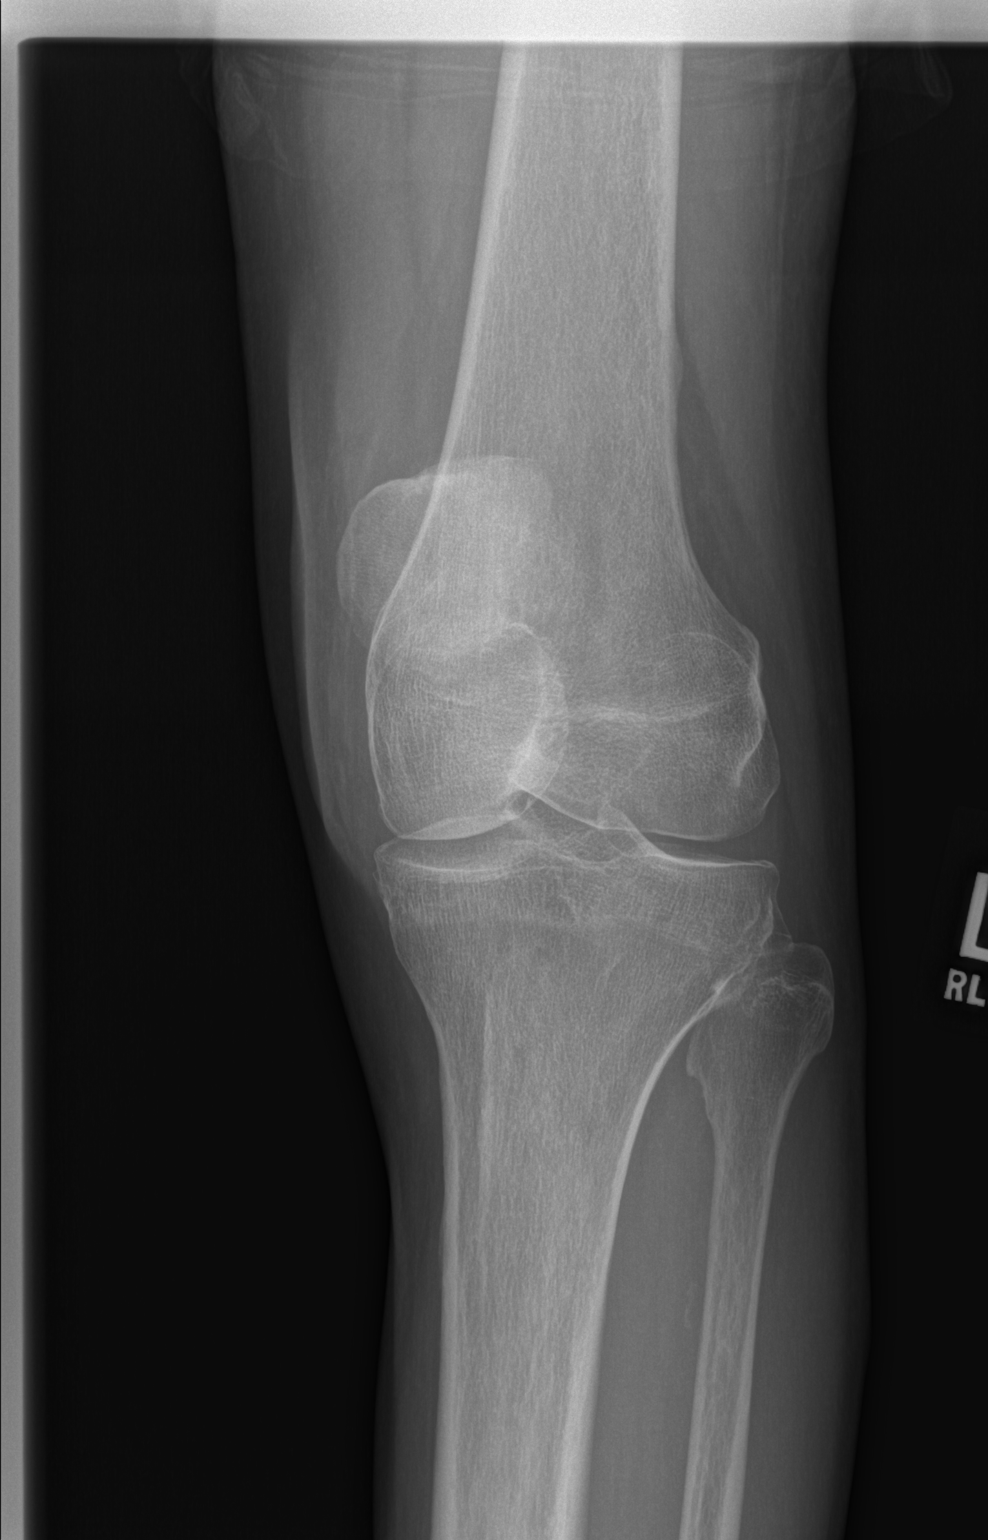

[t knee obl left (2 of 2)]
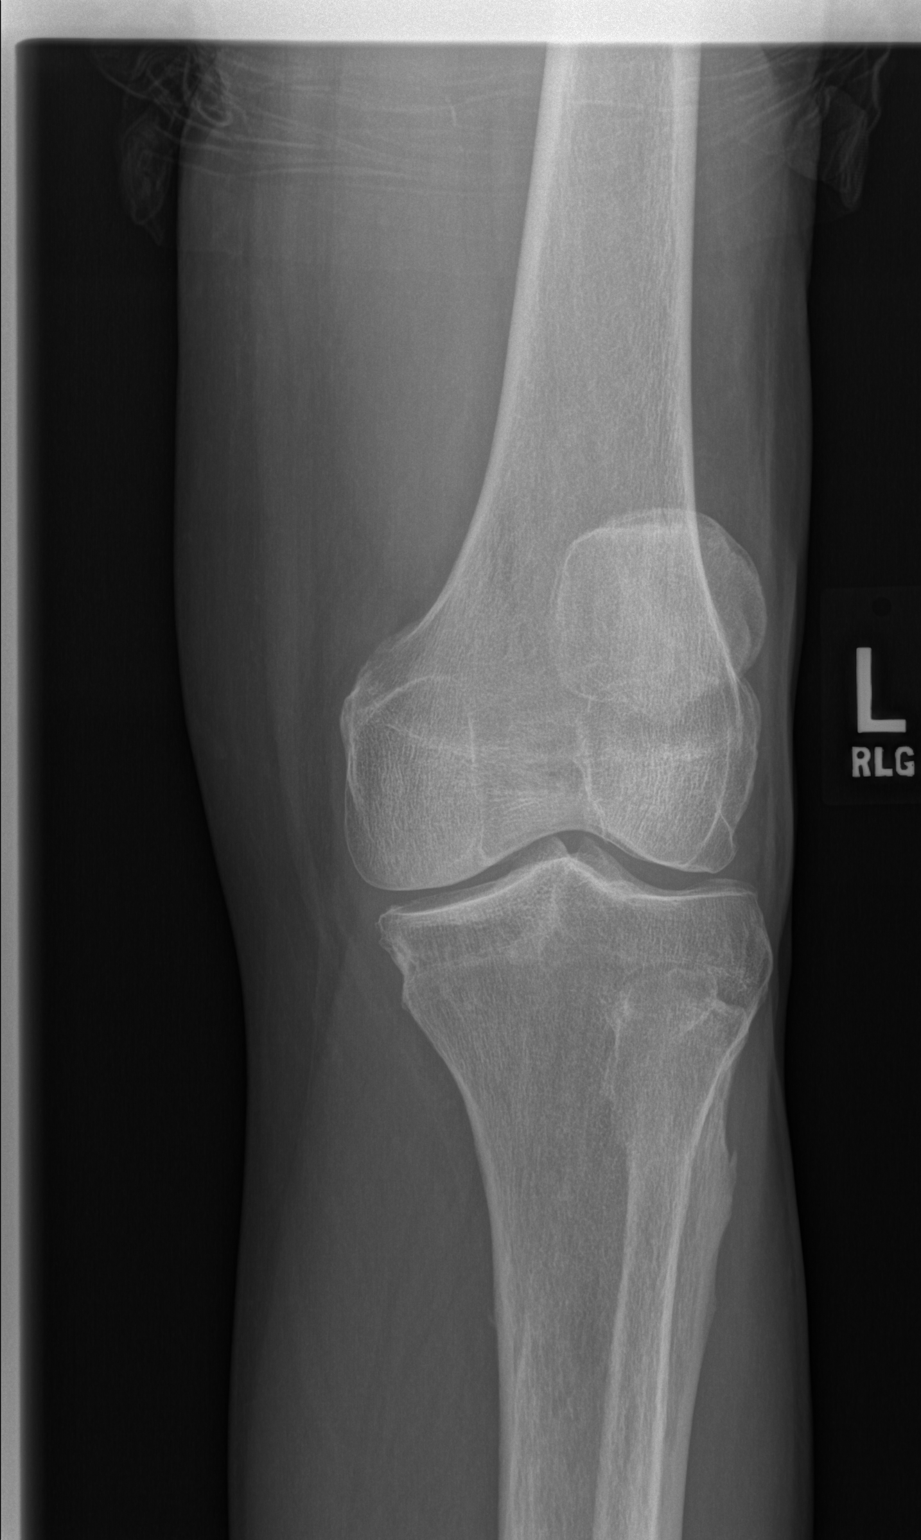

[t knee lat left]
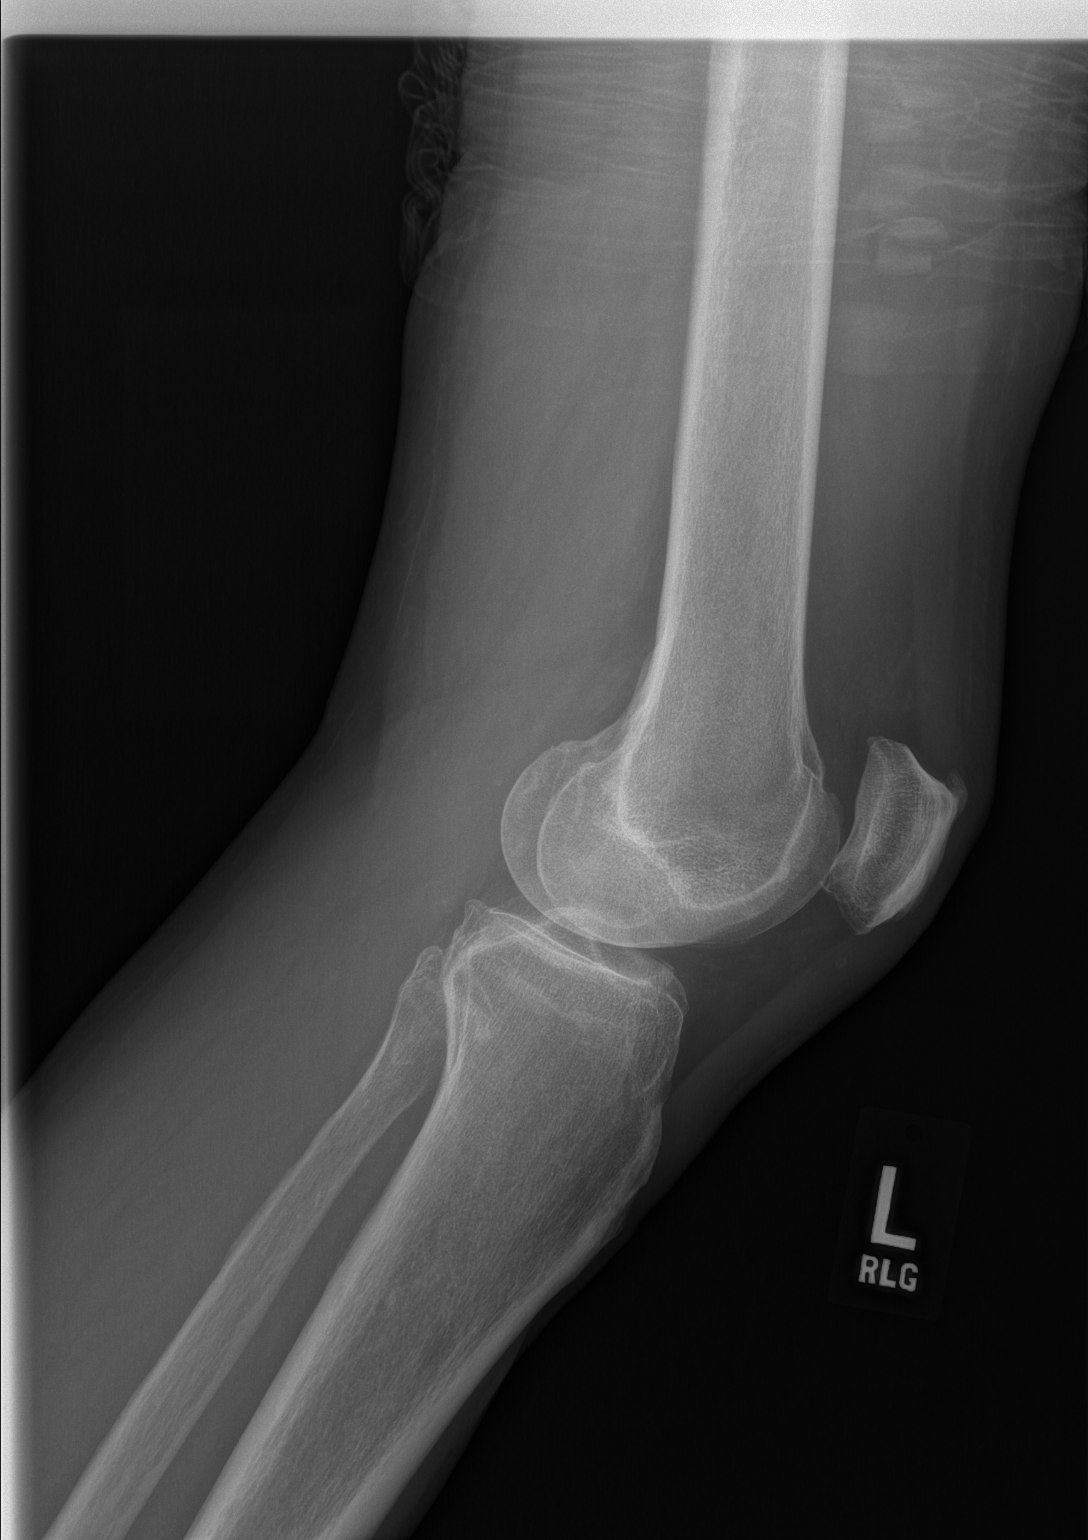

[4 of 4 positions shown; findings below may reference images not displayed]

FINDINGS: There is no acute fracture or dislocation. Alignment is normal.
There is mild medial and lateral compartment joint space narrowing.
There is mild superior patellar enthesopathy. There is no effusion.
The soft tissues are unremarkable.
IMPRESSION: No acute fracture or dislocation.

## 2021-10-18 ENCOUNTER — Other Ambulatory Visit: Payer: Self-pay | Admitting: Internal Medicine

## 2022-07-08 ENCOUNTER — Ambulatory Visit (HOSPITAL_COMMUNITY)
Admission: RE | Admit: 2022-07-08 | Discharge: 2022-07-08 | Disposition: A | Discharge status: Home / Self Care | Payer: BC Managed Care – PPO | Source: Ambulatory Visit | Attending: Cardiovascular Disease | Admitting: Cardiovascular Disease

## 2022-07-08 DIAGNOSIS — I6523 Occlusion and stenosis of bilateral carotid arteries: Secondary | ICD-10-CM | POA: Insufficient documentation

## 2022-07-11 ENCOUNTER — Other Ambulatory Visit: Payer: Self-pay

## 2022-07-11 DIAGNOSIS — I6523 Occlusion and stenosis of bilateral carotid arteries: Secondary | ICD-10-CM

## 2022-08-18 ENCOUNTER — Other Ambulatory Visit: Payer: Self-pay

## 2022-08-18 ENCOUNTER — Emergency Department (HOSPITAL_COMMUNITY): Payer: BC Managed Care – PPO

## 2022-08-18 ENCOUNTER — Observation Stay (HOSPITAL_COMMUNITY)
Admission: EM | Admit: 2022-08-18 | Discharge: 2022-08-20 | Disposition: A | Discharge status: Home / Self Care | Payer: BC Managed Care – PPO | Attending: Internal Medicine | Admitting: Internal Medicine

## 2022-08-18 DIAGNOSIS — I2510 Atherosclerotic heart disease of native coronary artery without angina pectoris: Secondary | ICD-10-CM | POA: Insufficient documentation

## 2022-08-18 DIAGNOSIS — D649 Anemia, unspecified: Secondary | ICD-10-CM | POA: Insufficient documentation

## 2022-08-18 DIAGNOSIS — Z87891 Personal history of nicotine dependence: Secondary | ICD-10-CM | POA: Insufficient documentation

## 2022-08-18 DIAGNOSIS — I5023 Acute on chronic systolic (congestive) heart failure: Secondary | ICD-10-CM | POA: Diagnosis not present

## 2022-08-18 DIAGNOSIS — Z7984 Long term (current) use of oral hypoglycemic drugs: Secondary | ICD-10-CM | POA: Insufficient documentation

## 2022-08-18 DIAGNOSIS — Z1152 Encounter for screening for COVID-19: Secondary | ICD-10-CM | POA: Diagnosis not present

## 2022-08-18 DIAGNOSIS — E871 Hypo-osmolality and hyponatremia: Secondary | ICD-10-CM | POA: Diagnosis not present

## 2022-08-18 DIAGNOSIS — Z79899 Other long term (current) drug therapy: Secondary | ICD-10-CM | POA: Diagnosis not present

## 2022-08-18 DIAGNOSIS — E8809 Other disorders of plasma-protein metabolism, not elsewhere classified: Secondary | ICD-10-CM | POA: Insufficient documentation

## 2022-08-18 DIAGNOSIS — Z8546 Personal history of malignant neoplasm of prostate: Secondary | ICD-10-CM | POA: Diagnosis not present

## 2022-08-18 DIAGNOSIS — I509 Heart failure, unspecified: Secondary | ICD-10-CM

## 2022-08-18 DIAGNOSIS — I110 Hypertensive heart disease with heart failure: Secondary | ICD-10-CM | POA: Insufficient documentation

## 2022-08-18 DIAGNOSIS — R0602 Shortness of breath: Secondary | ICD-10-CM | POA: Diagnosis present

## 2022-08-18 LAB — CBC
HCT: 36 % — ABNORMAL LOW (ref 39.0–52.0)
Hemoglobin: 11.8 g/dL — ABNORMAL LOW (ref 13.0–17.0)
MCH: 28.4 pg (ref 26.0–34.0)
MCHC: 32.8 g/dL (ref 30.0–36.0)
MCV: 86.7 fL (ref 80.0–100.0)
Platelets: 373 10*3/uL (ref 150–400)
RBC: 4.15 MIL/uL — ABNORMAL LOW (ref 4.22–5.81)
RDW: 13.1 % (ref 11.5–15.5)
WBC: 7.1 10*3/uL (ref 4.0–10.5)
nRBC: 0 % (ref 0.0–0.2)

## 2022-08-18 LAB — BASIC METABOLIC PANEL
Anion gap: 11 (ref 5–15)
BUN: 17 mg/dL (ref 8–23)
CO2: 22 mmol/L (ref 22–32)
Calcium: 8.9 mg/dL (ref 8.9–10.3)
Chloride: 99 mmol/L (ref 98–111)
Creatinine, Ser: 1.08 mg/dL (ref 0.61–1.24)
GFR, Estimated: >60 mL/min (ref >60)
Glucose, Bld: 279 mg/dL — ABNORMAL HIGH (ref 70–99)
Potassium: 4.4 mmol/L (ref 3.5–5.1)
Sodium: 132 mmol/L — ABNORMAL LOW (ref 135–145)

## 2022-08-18 LAB — RESPIRATORY PANEL BY PCR

## 2022-08-18 LAB — GLUCOSE, CAPILLARY
Glucose-Capillary: 202 mg/dL — ABNORMAL HIGH (ref 70–99)
Glucose-Capillary: 291 mg/dL — ABNORMAL HIGH (ref 70–99)

## 2022-08-18 LAB — HEPATIC FUNCTION PANEL
ALT: 11 U/L (ref 0–44)
AST: 13 U/L — ABNORMAL LOW (ref 15–41)
Albumin: 2.9 g/dL — ABNORMAL LOW (ref 3.5–5.0)
Alkaline Phosphatase: 45 U/L (ref 38–126)
Bilirubin, Direct: 0.1 mg/dL (ref 0.0–0.2)
Indirect Bilirubin: 0.6 mg/dL (ref 0.3–0.9)
Total Bilirubin: 0.7 mg/dL (ref 0.3–1.2)
Total Protein: 7.2 g/dL (ref 6.5–8.1)

## 2022-08-18 LAB — FERRITIN: Ferritin: 94 ng/mL (ref 24–336)

## 2022-08-18 LAB — LIPID PANEL
Cholesterol: 195 mg/dL (ref 0–200)
HDL: 30 mg/dL — ABNORMAL LOW (ref >40)
LDL Cholesterol: 140 mg/dL — ABNORMAL HIGH (ref 0–99)
Total CHOL/HDL Ratio: 6.5 RATIO
Triglycerides: 127 mg/dL (ref <150)
VLDL: 25 mg/dL (ref 0–40)

## 2022-08-18 LAB — BRAIN NATRIURETIC PEPTIDE: B Natriuretic Peptide: 1311.2 pg/mL — ABNORMAL HIGH (ref 0.0–100.0)

## 2022-08-18 LAB — MAGNESIUM: Magnesium: 1.7 mg/dL (ref 1.7–2.4)

## 2022-08-18 LAB — IRON AND TIBC
Iron: 33 ug/dL — ABNORMAL LOW (ref 45–182)
Saturation Ratios: 12 % — ABNORMAL LOW (ref 17.9–39.5)
TIBC: 273 ug/dL (ref 250–450)
UIBC: 240 ug/dL

## 2022-08-18 LAB — TROPONIN I (HIGH SENSITIVITY)
Troponin I (High Sensitivity): 15 ng/L (ref <18)
Troponin I (High Sensitivity): 15 ng/L (ref <18)

## 2022-08-18 LAB — HEMOGLOBIN A1C
Hgb A1c MFr Bld: 13.6 % — ABNORMAL HIGH (ref 4.8–5.6)
Mean Plasma Glucose: 343.62 mg/dL

## 2022-08-18 LAB — PHOSPHORUS: Phosphorus: 3.2 mg/dL (ref 2.5–4.6)

## 2022-08-18 LAB — SARS CORONAVIRUS 2 BY RT PCR: SARS Coronavirus 2 by RT PCR: NEGATIVE

## 2022-08-18 LAB — MRSA NEXT GEN BY PCR, NASAL: MRSA by PCR Next Gen: NOT DETECTED

## 2022-08-18 MED ORDER — TAMSULOSIN HCL 0.4 MG PO CAPS
0.4000 mg | ORAL_CAPSULE | Freq: Every day | ORAL | Status: DC
  Administered 2022-08-18 – 2022-08-19 (×2): 0.4 mg via ORAL
  Filled 2022-08-18 (×2): qty 1

## 2022-08-18 MED ORDER — METOPROLOL SUCCINATE ER 25 MG PO TB24
25.0000 mg | ORAL_TABLET | Freq: Every day | ORAL | Status: DC
  Administered 2022-08-18: 25 mg via ORAL
  Filled 2022-08-18: qty 1

## 2022-08-18 MED ORDER — ACETAMINOPHEN 325 MG PO TABS: 650.0000 mg | ORAL_TABLET | Freq: Four times a day (QID) | ORAL | Status: DC | PRN

## 2022-08-18 MED ORDER — POLYETHYLENE GLYCOL 3350 17 G PO PACK: 17.0000 g | PACK | Freq: Every day | ORAL | Status: DC | PRN

## 2022-08-18 MED ORDER — ACETAMINOPHEN 650 MG RE SUPP: 650.0000 mg | Freq: Four times a day (QID) | RECTAL | Status: DC | PRN

## 2022-08-18 MED ORDER — ATORVASTATIN CALCIUM 40 MG PO TABS
40.0000 mg | ORAL_TABLET | Freq: Every day | ORAL | Status: DC
  Administered 2022-08-18 – 2022-08-19 (×2): 40 mg via ORAL
  Filled 2022-08-18 (×2): qty 1

## 2022-08-18 MED ORDER — SACUBITRIL-VALSARTAN 49-51 MG PO TABS
1.0000 | ORAL_TABLET | Freq: Two times a day (BID) | ORAL | Status: DC
  Administered 2022-08-18 – 2022-08-20 (×4): 1 via ORAL
  Filled 2022-08-18 (×5): qty 1

## 2022-08-18 MED ORDER — FUROSEMIDE 10 MG/ML IJ SOLN
40.0000 mg | Freq: Once | INTRAMUSCULAR | Status: AC
  Administered 2022-08-18: 40 mg via INTRAVENOUS
  Filled 2022-08-18: qty 4

## 2022-08-18 MED ORDER — ENOXAPARIN SODIUM 40 MG/0.4ML IJ SOSY
40.0000 mg | PREFILLED_SYRINGE | INTRAMUSCULAR | Status: DC
  Administered 2022-08-18 – 2022-08-19 (×2): 40 mg via SUBCUTANEOUS
  Filled 2022-08-18 (×2): qty 0.4

## 2022-08-18 MED ORDER — INSULIN ASPART 100 UNIT/ML IJ SOLN
0.0000 [IU] | Freq: Three times a day (TID) | INTRAMUSCULAR | Status: DC
  Administered 2022-08-19: 8 [IU] via SUBCUTANEOUS
  Administered 2022-08-19: 15 [IU] via SUBCUTANEOUS
  Administered 2022-08-20: 2 [IU] via SUBCUTANEOUS
  Administered 2022-08-20: 8 [IU] via SUBCUTANEOUS

## 2022-08-18 MED ORDER — ORAL CARE MOUTH RINSE: 15.0000 mL | OROMUCOSAL | Status: DC | PRN

## 2022-08-18 NOTE — ED Provider Notes (Signed)
EMERGENCY DEPARTMENT AT [facility] Provider Note   CSN: [id number] Arrival date & time: 08/18/22  4098     History  Chief Complaint  Patient presents with   Shortness of Breath   Weakness   Fatigue   Dizziness    Derek Thompson is a 62 y.o. male.  HPI H/O DM,congestive heart failure grade I diastolic dysfunction and nonischemic cardiomyopathy, CAD. HTN and prostate cancer.  Patient reports for approximately 1 week he has been having trouble sleeping.  He reports that he always sleeps at somewhat of an incline due to his history of congestive heart failure.  However the last week has been more difficult and over the past couple days he started to notice significant shortness of breath at night.  He reports he is also started to have a cough has been productive of frothy white sputum.  Patient reports he has noticed swelling in his legs.  He denies he had chest pain or fever.  Patient denies any significant abdominal pain.  However he does note since his prostate surgery and cancer he has problems with urinary and fecal incontinence.  He denies this is any change from baseline for him since the prostate cancer.  Patient does admit he has been noncompliant with medications for several months.  He reports he has been unable to afford them and could not take any of his regular medications.  He has not been monitoring his blood pressure over this past few months to know how high they have been.  Patient does not smoke.  No alcohol use no drugs of abuse. Home Medications Prior to Admission medications   Medication Sig Start Date End Date Taking? Authorizing Provider  Accu-Chek Softclix Lancets lancets SMARTSIG:Topical 1-4 Times Daily 05/27/20   [provider]  atorvastatin (LIPITOR) 40 MG tablet TAKE 1 TABLET (40 MG TOTAL) BY MOUTH DAILY. 05/26/20 05/26/21  Swayze, Ava, DO  blood glucose meter kit and supplies KIT Dispense based on patient and insurance preference.  Use up to four times daily as directed. (FOR ICD-9 250.00, 250.01). 05/26/20   Swayze, Ava, DO  diclofenac Sodium (VOLTAREN) 1 % GEL Apply 2 g topically 4 (four) times daily. Patient not taking: Reported on 03/23/2021 12/08/20   Dietrich Pates, PA-C  docusate sodium (COLACE) 100 MG capsule Take 1 capsule (100 mg total) by mouth daily as needed for up to 30 doses. Patient not taking: Reported on 03/23/2021 02/04/21   Jannifer Hick, MD  FARXIGA 10 MG TABS tablet TAKE 1 TABLET(10 MG) BY MOUTH DAILY Patient taking differently: Take 10 mg by mouth daily. 09/11/20   Hilty, Lisette Abu, MD  gabapentin (NEURONTIN) 300 MG capsule Take 300 mg by mouth 2 (two) times daily. 01/11/21   [provider]  glipiZIDE (GLUCOTROL) 5 MG tablet TAKE 1 TABLET (5 MG TOTAL) BY MOUTH TWO TIMES DAILY. Patient not taking: Reported on 03/23/2021 05/26/20 05/26/21  Swayze, Ava, DO  metFORMIN (GLUCOPHAGE) 500 MG tablet Take 1 tablet (500 mg total) by mouth 2 (two) times daily with a meal. 05/03/20   Margarita Grizzle, MD  metoprolol succinate (TOPROL-XL) 25 MG 24 hr tablet TAKE 1 TABLET(25 MG) BY MOUTH DAILY 10/18/21   Hilty, Lisette Abu, MD  Multiple Vitamin (MULTIVITAMIN) tablet Take 1 tablet by mouth daily.    [provider]  oxyCODONE-acetaminophen (PERCOCET) 5-325 MG tablet Take 1 tablet by mouth every 4 (four) hours as needed for up to 12 doses for severe pain. Patient not  taking: Reported on 03/23/2021 02/04/21   Jannifer Hick, MD  sacubitril-valsartan (ENTRESTO) 49-51 MG Take 1 tablet by mouth 2 (two) times daily. 03/23/21   Hilty, Lisette Abu, MD  spironolactone (ALDACTONE) 25 MG tablet TAKE 1/2 TABLET (12.5 MG TOTAL) BY MOUTH DAILY. 05/26/20 05/26/21  Swayze, Ava, DO  tamsulosin (FLOMAX) 0.4 MG CAPS capsule Take 0.4 mg by mouth at bedtime. 10/10/20   [provider]      Allergies    Patient has no known allergies.    Review of Systems   Review of Systems  Physical Exam Updated Vital Signs BP (!) 155/88    Pulse (!) 55   Temp 98.4 F (36.9 C)   Resp 20   Ht 6' (1.829 m)   Wt 83.9 kg   SpO2 100%   BMI 25.09 kg/m  Physical Exam Constitutional:      Comments: Alert with clear mental status.  No respiratory distress at rest.  Well-nourished well-developed.  HENT:     Mouth/Throat:     Pharynx: Oropharynx is clear.  Eyes:     Extraocular Movements: Extraocular movements intact.  Cardiovascular:     Comments: Borderline tachycardia. Pulmonary:     Comments: No respiratory distress at rest.  Patient does have crackles in both lung fields to the midlung fields. Abdominal:     General: There is no distension.     Palpations: Abdomen is soft.     Tenderness: There is no abdominal tenderness. There is no guarding.  Musculoskeletal:     Comments: 1-2+ pitting edema bilateral lower extremities.  Skin:    General: Skin is warm and dry.     Coloration: Skin is pale.  Neurological:     General: No focal deficit present.     Mental Status: He is oriented to person, place, and time.     Motor: No weakness.     Coordination: Coordination normal.  Psychiatric:        Mood and Affect: Mood normal.     ED Results / Procedures / Treatments   Labs (all labs ordered are listed, but only abnormal results are displayed) Labs Reviewed  BASIC METABOLIC PANEL - Abnormal; Notable for the following components:      Result Value   Sodium 132 (*)    Glucose, Bld 279 (*)    All other components within normal limits  CBC - Abnormal; Notable for the following components:   RBC 4.15 (*)    Hemoglobin 11.8 (*)    HCT 36.0 (*)    All other components within normal limits  BRAIN NATRIURETIC PEPTIDE  HEPATIC FUNCTION PANEL  PHOSPHORUS  MAGNESIUM  TROPONIN I (HIGH SENSITIVITY)    EKG EKG Interpretation  Date/Time:  Thursday August 18 2022 08:25:25 EDT Ventricular Rate:  121 PR Interval:  128 QRS Duration: 130 QT Interval:  364 QTC Calculation: 516 R Axis:   98 Text Interpretation: Sinus  tachycardia with Premature atrial complexes Rightward axis Left ventricular hypertrophy with QRS widening and repolarization abnormality ( Cornell product ) Abnormal ECG When compared with ECG of 23-May-2020 10:45, PREVIOUS ECG IS PRESENT agree, similar to previous tracings Confirmed by Arby Barrette [phone]) on 08/18/2022 12:04:40 PM  Radiology DG Chest 2 View  Result Date: 08/18/2022 CLINICAL DATA:  Cough, shortness of breath EXAM: CHEST - 2 VIEW COMPARISON:  12/03/2020 FINDINGS: The heart size and mediastinal contours are within normal limits. Aortic atherosclerosis. Bilateral interstitial opacities, most pronounced within the perihilar regions. No pleural effusion  or pneumothorax. The visualized skeletal structures are unremarkable. IMPRESSION: Bilateral interstitial opacities, most pronounced within the perihilar regions. Findings may reflect edema or atypical/viral infection. Electronically Signed   By: Duanne Guess D.O.   On: 08/18/2022 09:16    Procedures Procedures    Medications Ordered in ED Medications  furosemide (LASIX) injection 40 mg (has no administration in time range)    ED Course/ Medical Decision Making/ A&P                             Medical Decision Making Amount and/or Complexity of Data Reviewed Labs: ordered. Radiology: ordered.  Risk Prescription drug management. Decision regarding hospitalization.   Patient presents as outlined.  He has a known history of diastolic dysfunction.  When he was initially diagnosed his EF was 20 to 25%.  Patient recovered to up to 50% ejection fraction.  That was in from echo 10\11\2022.  Patient has been developing incremental symptoms of congestive heart failure.  He reports due to financial constraints he has not been available to afford medications.  At time of examination patient does have crackles on exam and peripheral edema.  Proceed with diagnostic evaluation for ACS\CHF\pneumonia.  Chest x-ray visually reviewed by  myself and interpreted by radiology positive for vascular congestion.  EKG reviewed by myself consistent with previous with LVH and tachycardia.  Will initiate Lasix 40 mg IV.  Labs reviewed.  Troponin normal.  GFR greater than 60, BNP 1311   Consult: Internal medicine teaching service for admission.  Patient will require admission.  He has high risk for complicated medical course with congestive heart failure and previous significant drop in ejection fraction down to 20%.  Patient will need diuresis and likely repeat echocardiogram.  At this time he is not having any active chest pain and EKG is not showing ischemic changes.  Stable for admission to medical service.        Final Clinical Impression(s) / ED Diagnoses Final diagnoses:  Acute on chronic congestive heart failure, unspecified heart failure type    Rx / DC Orders ED Discharge Orders     None         Arby Barrette, MD 08/19/22 [phone]

## 2022-08-18 NOTE — Hospital Course (Addendum)
Derek Thompson is a 62 y/o male with past medical history of HTN, HLD, T2DM, CHF w/ G1DD, nonischemic cardiomyopathy, CAD, prostate cancer that presents for cough, exertional dyspnea, orthopnea and was admitted for acute on chronic heart failure.    #Acute on chronic CHF #HFrEF #Hypertension Patient has history of HFrEF. Echo from 01/2021 w/ EF 45-50% and G1DD.  Previously followed by [facility] cardiology, last appointment appears to be in 03/2021. He was supposed to be taking Farxiga 10 mg daily, Metoprolol 25 mg daily, Sacubitril-Valsartan 49-51 mg BID, and Spironolactone 12.5 mg daily, but has not taken these medications consistently for at least 4 months due to difficulty affording these medications and frustration regarding his health conditions. He initially presented with cough, exertional dyspnea, orthopnea, and minimal lower extremity swelling. Vitals were notable for the patient being hypertensive and tachycardic on admission, but he was saturating well on room air. Troponin WNL.  BNP elevated at 1311 and CXR demonstrated bilateral interstitial opacities most pronounced in the perihilar region, reflecting edema versus atypical/viral infection. EKG without signs of ischemia.  On initial exam, patient has minimal lower extremity edema and mild crackles in the lung bases.  Suspect symptoms are likely secondary to acute CHF exacerbation in the setting of medication nonadherence.  Patient was given IV Lasix 40 mg while in the ED, but urine output was not properly measured due to the patient wearing briefs (hx of urinary incontinence). Will obtain echo to assess cardiac function.  Given lack of leukocytosis or increased oxygen requirements, low suspicion for pneumonia at this time, and RVP was negative. Echo was repeated and showed EF of 25% with G2DD and LV global hypokinesis. Slowly added back GDMT. Patient was discharged with Entresto 49-51 mg BID and Metoprolol 25 mg daily. Consider adding back more GDMT,  as blood pressure tolerates.    #Nonischemic cardiomyopathy #CAD #HLD Troponin WNL.  EKG without signs of ischemia.  Patient is supposed to be taking atorvastatin 40 mg daily at home.  Intermittently compliant with this medication. Lipid panel with HDL 30, LDL 140 and ASCVD risk elevated to 14.5%. Restarted atorvastatin 40 mg daily.   #T2DM A1C was 8.1% 1 year ago.  Patient is supposed to be taking metformin 500 mg BID, glipizide 5 mg BID, and farxiga 10 mg daily at home.  Also takes gabapentin 300 mg BID at home. Has not taken his medications consistently over the last few months and A1c now elevated to 13.6%. Started on lantus 16u qhs and metformin- discharged with this as well, and the patient was taught how to inject insulin. Adjust regimen as needed in the outpatient setting.    #Prostate Cancer Patient underwent prostate biopsy in 07/2020 after found to have elevated PSA.  Biopsy demonstrated adenocarcinoma.  PET scan in 08/2020 demonstrated activity in the periphery of the prostate gland extending into the seminal vesicle. Underwent brachytherapy.  Followed previously by [facility] health radiation oncology as outpatient. Most recent visit appears to be in 04/2021. Completed radiation treatment at this time but did not present to follow up visit afterwards. Also follows with Dr. Cardell Peach (urology) as outpatient. No recent visits on file. Has chronic urinary incontinence at home secondary to prior treatment of prostate cancer.  Taking tamsulosin 0.4 mg daily intermittently at home. Suspect patient would benefit from outpatient follow-up with radiation oncology and urology.   #Normocytic anemia The patient endorses a long history of fatigue and CBC demonstrates normocytic anemia with hemoglobin 11.8.  Appears chronic since at least 2022.  No signs of bleeding at this time. Ferritin WNL at 94, but iron low at 33 and iron sat low at 12%. Start iron supplementation at hospital follow up.    #Hyponatremia Mild  hyponatremia on admission of 132.  Likely in the setting of acute CHF exacerbation and pseudohyponatremia from elevated CBGs.   #Hypoalbuminemia Hepatic function panel with hypoalbuminemia at 2.9.  Potentially in the setting of protein malnutrition.  #SDoH The patient states that he stopped taking all of his medications about 4 months ago due to cost + feeling overwhelmed by his diagnoses. He is interested in talking to a counselor/therapist at discharge and is also interested in establishing care with the [facility]. Will plan to refer patient to integrated behavioral health, Marena Chancy, at the [facility].

## 2022-08-18 NOTE — H&P (Addendum)
Date: 08/18/2022               Patient Name:  Derek Thompson MRN: [id number]  DOB: 09-14-1960 Age / Sex: 62 y.o., male   PCP: [name], MD         Medical Service: Internal Medicine Teaching Service         Attending Physician: Dr. Dickie La, MD    First Contact: Dr. Gaylyn Cheers Aniketh Huberty Pager: [phone]  Second Contact: Dr. Elza Rafter Pager: [phone]       After Hours (After 5p/  First Contact Pager: [phone]  weekends / holidays): Second Contact Pager: [phone]   Chief Complaint: cough   History of Present Illness:  Derek Thompson is a 62 y/o male with past medical history of HTN, HLD, T2DM, CHF w/ G1DD, nonischemic cardiomyopathy, CAD, prostate cancer that presents for cough.   Patient reports cough productive of white sputum over the last week. Patient also reports exertional dyspnea and lightheadedness with standing over this period of time.  Also having difficulty lying flat at night.  Reports trouble breathing when lying flat at night over the last week. Chronically sleeps with wedge pillow behind his back. Patient also reports that his feet have been intermittently swollen over the last few weeks but denies leg swelling.  Patient is unsure of if he is gaining weight  at this time. Patient reports that his current symptoms feel similar to his prior episode of CHF exacerbation.   He reports that he has not taken any of his medications consistently for at least 6 months.  Was taking his medications intermittently over the 2 months following after, however, has not taken many of his medications at all over the last 4 months.  He reports having difficulty affording his medications and feels overwhelmed with his health conditions.  Also reports chronic intermittent night sweats. He attributes this to his "hormone injections" that he receives for his prostate cancer treatment. Also has chronic fatigue and feels as though work has become a lot more difficult due to his fatigue.  Follows with Dr. Cardell Peach (urology) as an outpatient.  Also wears Pampers at baseline due to urinary incontinence resulting from treatment of prostate cancer.  Patient denies fevers, chills, n/v/d, constipation, abdominal pain, chest pain, palpitations, dysuria, headache, syncope.   Review of Systems: A complete ROS was negative except as per HPI.   ED Course:  Interventions: Patient was given IV lasix 40 mg.    Past Medical History:  Diagnosis Date   Anemia    Diabetes mellitus without complication (HCC)    Family history of breast cancer    Hypertension    Myocardial infarction [facility])    Prostate cancer (HCC)     Meds:  -Atorvastatin 40 mg  -Farxiga 10 mg daily -Metformin 500 mg BID -Glipizide 5 mg bid -Metoprolol 25 mg daily -Sacubitril-Valsartan 49-51 mg BID -Spironolactone 12.5 mg daily -Tamsulosin 0.4 mg daily -Voltaren gel -Gabapentin 300 mg BID -Multivitamin daily  Current Meds  Medication Sig   atorvastatin (LIPITOR) 40 MG tablet TAKE 1 TABLET (40 MG TOTAL) BY MOUTH DAILY.   FARXIGA 10 MG TABS tablet TAKE 1 TABLET(10 MG) BY MOUTH DAILY (Patient taking differently: Take 10 mg by mouth daily.)   glipiZIDE (GLUCOTROL) 5 MG tablet TAKE 1 TABLET (5 MG TOTAL) BY MOUTH TWO TIMES DAILY.   metFORMIN (GLUCOPHAGE) 500 MG tablet Take 1 tablet (500 mg total) by mouth 2 (two) times daily with a meal.   Multiple  Vitamin (MULTIVITAMIN) tablet Take 1 tablet by mouth daily.   sacubitril-valsartan (ENTRESTO) 49-51 MG Take 1 tablet by mouth 2 (two) times daily.   tamsulosin (FLOMAX) 0.4 MG CAPS capsule Take 0.4 mg by mouth at bedtime.    Allergies: none.   Surgical History: prostate biopsy  Family History: breast cancer (mother), HTN (unspecified), DM (unspecified). Denies family hx of MI or stroke.   Social History: Currently lives by himself here in [place]. Works as Engineer, water (packages parts, physically demanding). Independent of all ADLs/IADLs. Ambulates w/o  assistance at baseline.  Reports quitting tobacco use in 2019 (prior to this smoked 1 pack/day since his teenage years). Quit drinking alcohol in 2015 (reports heavy daily alcohol use prior to this for several years).  Denies current or prior recreational drug use.  Physical Exam: Blood pressure (!) 151/99, pulse (!) 106, temperature 98.4 F (36.9 C), resp. rate 20, height 6' (1.829 m), weight 83.9 kg, SpO2 100 %. Constitutional: resting in bed, appears uncomfortable, intermittently coughing HENT: Normocephalic and atraumatic. Poor dentition. Moist mucous membranes.  Eyes: EOMI. PERRL.  Neck: Normal range of motion.  Cardiovascular: Tachycardic, regular rhythm. No murmurs, rubs, or gallops. Normal radial and PT pulses bilaterally. Trace bilateral LE edema.  Pulmonary: Normal respiratory effort. No wheezes, minimal crackles at bilateral bases. Abdominal: Soft. Non-distended. No tenderness. Normal bowel sounds.  Musculoskeletal: Normal range of motion.     Neurological: Alert and oriented to person, place, and time. Non-focal. Skin: warm and dry.    DG Chest 2 View  Result Date: 08/18/2022 CLINICAL DATA:  Cough, shortness of breath EXAM: CHEST - 2 VIEW COMPARISON:  12/03/2020 FINDINGS: The heart size and mediastinal contours are within normal limits. Aortic atherosclerosis. Bilateral interstitial opacities, most pronounced within the perihilar regions. No pleural effusion or pneumothorax. The visualized skeletal structures are unremarkable. IMPRESSION: Bilateral interstitial opacities, most pronounced within the perihilar regions. Findings may reflect edema or atypical/viral infection. Electronically Signed   By: Duanne Guess D.O.   On: 08/18/2022 09:16    EKG: personally reviewed my interpretation is sinus tachycardia w/ PACs, RAD, LVH w/ QRS widening.   Assessment & Plan by Problem: Principal Problem:   Acute on chronic heart failure   Derek Thompson is a 62 y/o male with past medical  history of HTN, HLD, T2DM, CHF w/ G1DD, nonischemic cardiomyopathy, CAD, prostate cancer that presents for cough, exertional dyspnea, orthopnea and was admitted for acute on chronic heart failure.   #Acute on chronic CHF #HFrEF Patient has history of HFrEF. Echo from 01/2021 w/ EF 45-50% and G1DD.  Previously followed by [facility] cardiology, last appointment appears to be in 03/2021. Supposed to be taking Farxiga 10 mg daily, Metoprolol 25 mg daily, Sacubitril-Valsartan 49-51 mg BID, and Spironolactone 12.5 mg daily. Has not taken these medications consistently for at least 4 months due to difficulty affording these medications and frustration regarding his health conditions.  Presents with cough, exertional dyspnea, orthopnea, and minimal lower extremity swelling.  Hypertensive and tachycardic on admission. Satting well on room air.  Troponin WNL.  BNP elevated at 1311.  EKG without signs of ischemia. CXR demonstrates bilateral interstitial opacities most pronounced in the perihilar region, reflecting edema versus atypical/viral infection.  On exam, patient has minimal lower extremity edema and mild crackles in the lung bases.  Suspect symptoms are likely secondary to acute CHF exacerbation in the setting of medication nonadherence.  Patient was given IV Lasix 40 mg while in the ED.  Will trend urine  output and reassess volume status.  Will obtain echo to assess cardiac function.  Given lack of leukocytosis or increased oxygen requirements, low suspicion for pneumonia at this time. However, with his cough and CXR findings, will order RVP to assess for respiratory viral infection.  -Pending echo -Daily weights -Telemetry -Strict Is/Os -Pending RVP  #Nonischemic cardiomyopathy #CAD #HLD Troponin WNL.  EKG without signs of ischemia.  Patient is supposed to be taking atorvastatin 40 mg daily at home.  Intermittently compliant with this medication. Lipid panel from 2 years ago WNL with exception of HDL 31, LDL  97. Will repeat lipid panel to assess need for pharmacotherapy. -Pending lipid panel  #T2DM A1C was 8.1% 1 year ago.  Patient is supposed to be taking metformin 500 mg BID, glipizide 5 mg BID, and farxiga 10 mg daily at home.  Also takes gabapentin 300 mg BID at home. Has not taken his medications consistently over the last few months.  CBGs in the 200s on admission.  Will repeat A1c to assess need for pharmacotherapy. -SSI moderate -Trend CBGs -Pending A1c  #HTN Not on any antihypertensive medications at home. Hypertensive with MAP in the 90s/low 100s since admission.  Will continue to trend BP to assess need for pharmacotherapy. -Trend BP  #Prostate Cancer Patient underwent prostate biopsy in 07/2020 after found to have elevated PSA.  Biopsy demonstrated adenocarcinoma.  PET scan in 08/2020 demonstrated activity in the periphery of the prostate gland extending into the seminal vesicle. Underwent brachytherapy.  Followed previously by [facility] health radiation oncology as outpatient. Most recent visit appears to be in 04/2021. Completed radiation treatment at this time but did not present to follow up visit afterwards. Also follows with Dr. Cardell Peach (urology) as outpatient. No recent visits on file. Has chronic urinary incontinence at home secondary to prior treatment of prostate cancer.  Taking tamsulosin 0.4 mg daily intermittently at home. Suspect patient would benefit from outpatient follow-up with radiation oncology and urology. -Outpatient f/u w/ radiation oncology/urology -Tylenol PRN  #Normocytic anemia CBC demonstrates normocytic anemia with hemoglobin 11.8.  Appears chronic since at least 2022.  No signs of bleeding at this time. No recent iron studies on file.  Given his chronic fatigue, will order iron studies. -Pending iron, ferritin, TIBC -Trend CBC  #Hyponatremia Mild hyponatremia on admission of 132.  Likely in the setting of acute CHF exacerbation.  Could consider urine studies if  hyponatremia persist despite treatment for CHF. -Trend BMP  #Hypoalbuminemia Hepatic function panel with hypoalbuminemia at 2.9.  Potentially in the setting of protein malnutrition. -Encourage PO intake   Diet: HH Bowel: miralax VTE: lovenox IVF: none Code: full PT/OT recs: none   Prior to Admission Living Arrangement: home by himself Anticipated Discharge Location: TBD Barriers to Discharge: continued management  Dispo: Admit patient to Observation with expected length of stay less than 2 midnights.  Signed: Karoline Caldwell, MD 08/18/2022, 4:21 PM  Pager: [phone] After 5pm on weekdays and 1pm on weekends: On Call pager: [phone]

## 2022-08-18 NOTE — ED Triage Notes (Signed)
Pt. Stated, last night I started having SOB, Im weak and fatigue and I have hot flashes sometimes.

## 2022-08-19 ENCOUNTER — Observation Stay (HOSPITAL_BASED_OUTPATIENT_CLINIC_OR_DEPARTMENT_OTHER): Payer: BC Managed Care – PPO

## 2022-08-19 ENCOUNTER — Encounter (HOSPITAL_COMMUNITY): Payer: Self-pay | Admitting: Internal Medicine

## 2022-08-19 ENCOUNTER — Other Ambulatory Visit (HOSPITAL_COMMUNITY): Payer: Self-pay

## 2022-08-19 DIAGNOSIS — I5023 Acute on chronic systolic (congestive) heart failure: Secondary | ICD-10-CM | POA: Diagnosis not present

## 2022-08-19 DIAGNOSIS — I110 Hypertensive heart disease with heart failure: Secondary | ICD-10-CM

## 2022-08-19 DIAGNOSIS — I5021 Acute systolic (congestive) heart failure: Secondary | ICD-10-CM | POA: Diagnosis not present

## 2022-08-19 DIAGNOSIS — Z87891 Personal history of nicotine dependence: Secondary | ICD-10-CM

## 2022-08-19 LAB — BASIC METABOLIC PANEL
Anion gap: 11 (ref 5–15)
BUN: 16 mg/dL (ref 8–23)
CO2: 22 mmol/L (ref 22–32)
Calcium: 8.6 mg/dL — ABNORMAL LOW (ref 8.9–10.3)
Chloride: 97 mmol/L — ABNORMAL LOW (ref 98–111)
Creatinine, Ser: 1.2 mg/dL (ref 0.61–1.24)
GFR, Estimated: >60 mL/min (ref >60)
Glucose, Bld: 298 mg/dL — ABNORMAL HIGH (ref 70–99)
Potassium: 4 mmol/L (ref 3.5–5.1)
Sodium: 130 mmol/L — ABNORMAL LOW (ref 135–145)

## 2022-08-19 LAB — GLUCOSE, CAPILLARY
Glucose-Capillary: 258 mg/dL — ABNORMAL HIGH (ref 70–99)
Glucose-Capillary: 156 mg/dL — ABNORMAL HIGH (ref 70–99)
Glucose-Capillary: 352 mg/dL — ABNORMAL HIGH (ref 70–99)
Glucose-Capillary: 176 mg/dL — ABNORMAL HIGH (ref 70–99)

## 2022-08-19 LAB — CBC
HCT: 34.1 % — ABNORMAL LOW (ref 39.0–52.0)
Hemoglobin: 11.4 g/dL — ABNORMAL LOW (ref 13.0–17.0)
MCH: 28.8 pg (ref 26.0–34.0)
MCHC: 33.4 g/dL (ref 30.0–36.0)
MCV: 86.1 fL (ref 80.0–100.0)
Platelets: 346 10*3/uL (ref 150–400)
RBC: 3.96 MIL/uL — ABNORMAL LOW (ref 4.22–5.81)
RDW: 13 % (ref 11.5–15.5)
WBC: 8.3 10*3/uL (ref 4.0–10.5)
nRBC: 0 % (ref 0.0–0.2)

## 2022-08-19 LAB — ECHOCARDIOGRAM COMPLETE
AR max vel: 2.33 cm2
AV Area VTI: 2.25 cm2
AV Area mean vel: 2.23 cm2
AV Mean grad: 2 mmHg
AV Peak grad: 3.2 mmHg
Ao pk vel: 0.9 m/s
Area-P 1/2: 5.14 cm2
Calc EF: 21.9 %
Est EF: 25
Height: 72 in
S' Lateral: 4.5 cm
Single Plane A2C EF: 25.7 %
Single Plane A4C EF: 7.7 %
Weight: 2793.67 oz

## 2022-08-19 LAB — HIV ANTIBODY (ROUTINE TESTING W REFLEX): HIV Screen 4th Generation wRfx: NONREACTIVE

## 2022-08-19 MED ORDER — METOPROLOL SUCCINATE ER 25 MG PO TB24
25.0000 mg | ORAL_TABLET | Freq: Every day | ORAL | Status: DC
  Administered 2022-08-19: 25 mg via ORAL
  Filled 2022-08-19: qty 1

## 2022-08-19 MED ORDER — INSULIN STARTER KIT- PEN NEEDLES (ENGLISH)
1.0000 | Freq: Once | Status: AC
  Administered 2022-08-19: 1
  Filled 2022-08-19: qty 1

## 2022-08-19 MED ORDER — LIVING WELL WITH DIABETES BOOK
Freq: Once | Status: AC
  Filled 2022-08-19: qty 1

## 2022-08-19 MED ORDER — INSULIN GLARGINE-YFGN 100 UNIT/ML ~~LOC~~ SOLN
16.0000 [IU] | Freq: Every day | SUBCUTANEOUS | Status: DC
  Administered 2022-08-19 – 2022-08-20 (×2): 16 [IU] via SUBCUTANEOUS
  Filled 2022-08-19 (×2): qty 0.16

## 2022-08-19 NOTE — TOC Progression Note (Signed)
Transition of Care [facility]) - Progression Note    Patient Details  Name: Derek Thompson MRN: [id number] Date of Birth: [birth year]  Transition of Care [facility]) CM/SW Contact  Leone Haven, RN Phone Number: 08/19/2022, 11:48 AM  Clinical Narrative:    From home alone, CHF, diuresing, new DM2, Diabetic coordinator to see.  TOC following.        Expected Discharge Plan and Services                                               Social Determinants of Health (SDOH) Interventions SDOH Screenings   Tobacco Use: Medium Risk (05/05/2021)    Readmission Risk Interventions     No data to display

## 2022-08-19 NOTE — Progress Notes (Signed)
Nurse requested Mobility Specialist to perform oxygen saturation test with pt which includes removing pt from oxygen both at rest and while ambulating.  Below are the results from that testing.     Patient Saturations on Room Air at Rest = spO2 95%  Patient Saturations on Room Air while Ambulating = sp02 95% .  Rested and performed pursed lip breathing for 1 minute with sp02 at 95%.  Reported results to nurse.

## 2022-08-19 NOTE — Plan of Care (Signed)

## 2022-08-19 NOTE — Progress Notes (Signed)
    Heart Failure Stewardship Pharmacist Progress Note  Was successful in obtaining a copay card for [facility].  This copay card will make the patients copay $10 per 30-90 day supply.  The billing information is as follows and has been shared with the patient and stored in [facility] records.   RxBin: [id number] PCN: [id number] Member ID: [id number] Group ID: [id number]  Sharen Hones, PharmD, BCPS Heart Failure Stewardship Pharmacist Phone [phone]

## 2022-08-19 NOTE — Progress Notes (Signed)
Subjective:  The patient denies any shortness of breath this AM and feels improved compared to yesterday, stating that he has not felt this well in a while. He denies any chest pain, palpitations, n/v.   ON: none  VS: tachycardic (improved to 104), WNL (98.5, 19, 108/71, 96% on RA)   Objective:  Vital signs in last 24 hours: Vitals:   08/18/22 2337 08/19/22 0402 08/19/22 0727 08/19/22 1140  BP: 101/83 108/71 109/67 116/66  Pulse: (!) 102 (!) 104  96  Resp: (!) 23  Temp: 99.9 F (37.7 C) 98.5 F (36.9 C) 98.5 F (36.9 C) 98.5 F (36.9 C)  TempSrc: Oral Oral Oral Oral  SpO2: 97% 96% 96% 99%  Weight:  79.2 kg    Height:        Physical Exam: Constitutional: resting in bed comfortably Cardiovascular: Regular rate, regular rhythm. No murmurs, rubs, or gallops. Normal radial and PT pulses bilaterally. Trace bilateral LE edema, improved.  Pulmonary: Normal respiratory effort. No wheezes, minimal crackles at bilateral bases. Abdominal: Soft. Non-distended. No tenderness. Normal bowel sounds.  Musculoskeletal: Normal range of motion.     Neurological: Alert and oriented to person, place, and time. Non-focal. Skin: warm and dry.  Assessment/Plan:  Principal Problem:   Acute on chronic heart failure   Esau Bolander is a 62 y/o male with past medical history of HTN, HLD, T2DM, CHF w/ G1DD, nonischemic cardiomyopathy, CAD, prostate cancer that presents for cough, exertional dyspnea, orthopnea and was admitted for acute on chronic heart failure.    #Acute on chronic CHF #HFrEF Most recent echo from 01/2021 w/ EF 45-50% and G1DD. Supposed to be taking Farxiga 10 mg daily, Metoprolol 25 mg daily, Entresto 49-51 mg BID, and Spironolactone 12.5 mg daily at home. Has not taken his medications regularly in several months due to cost and frustration from health conditions. Remains HDS today and has no new oxygen requirements. Remains afebrile and without leukocytosis. RVP  negative. Low suspicion for pneumonia at this time. Received IV lasix 40 mg yesterday. No UOP charted. Pending updated echo, as we suspect the patient's symptoms are due to CHF and medication non-adherence. No need for further diuresis today. -Continue Entresto 49-51 mg bid -Pending echo -Will resume metoprolol if EF not significantly reduced and will get back on GDMT -Daily weights -Telemetry -Strict Is/Os   #Nonischemic cardiomyopathy #CAD #HLD Troponin normal. EKG w/o signs of ischemia. Was taking atorvastatin 40 mg daily inconsistently at home. Lipid panel here w/ HDL 30, LDL 140. ASCVD risk is 14.5%. Should be on high intensity statin. -Atorvastatin 40 mg   #T2DM Taking metformin 500 mg BID, glipizide 5 mg BID, and farxiga 10 mg daily inconsistently at home. A1c 13.6% here. CBGs in the 200s since admission. Started on long-acting insulin today and will need to be discharged with this. Also will discontinue glipizide at discharge, given new need for insulin -Lantus 16u daily -SSI moderate -Trend CBGs   #HTN No antihypertensive medications at home. Normotensive this morning.  -Continue Entresto, add GDMT back as able   #Prostate Cancer s/p radiation Adenocarcinoma diagnosed in 2022. Followed w/ [facility] radiation oncology. Completed radiation as of 04/2021 but did not f/u with radiation oncology afterwards. Has chronic urinary incontinence since treatment. Wears pampers at baseline, however, will switch to condom cath to better measure urine output during hospitalization. Follows outpatient w/ urology (Dr. Cardell Peach), but has not been seen since 2022.  -Outpatient f/u w/ radiation oncology/urology -Tamsulosin 0.4  mg daily -Tylenol PRN   #Normocytic anemia Mild normocytic anemia. Hgb stable at 11.4 today. Iron low at 33, saturation ratio low. Ferritin normal at 94.  -Will start iron supplementation at discharge   #Hyponatremia Mild hyponatremia on admission. Na 130 today. Likely 2/2  acute CHF exacerbation and elevated blood glucose.   -Trend BMP   #Hypoalbuminemia Low albumin at 2.9 on admission.  Potentially in the setting of protein malnutrition. -Encourage PO intake     Diet: HH Bowel: miralax VTE: lovenox IVF: none Code: full PT/OT recs: none     Prior to Admission Living Arrangement: home by himself Anticipated Discharge Location: home Barriers to Discharge: continued management Dispo: Anticipated discharge in approximately 1-2 day(s).   Chauncey Mann, DO 08/19/2022, 1:23 PM Pager: [phone] After 5pm on weekdays and 1pm on weekends: On Call pager [phone]

## 2022-08-19 NOTE — Progress Notes (Signed)
Heart Failure Nurse Navigator Progress Note  PCP: [name], MD PCP-Cardiologist: Hilty Admission Diagnosis: Acute on chronic congestive heart failure.  Admitted from: Home  Presentation:   Derek Thompson presented with shortness of breath, weakness, and fatigue, unable to sleep. Reported has been non compliant with medications due to costs. BP 155/88, HR 55, 1-2 + pitting edema, BNP  1,311, IV lasix given, CXR with bilateral interstitial opacities reflecting edema or infection.   Patient was educated on the sign and symptoms of heart failure, daily weights, when to call his doctor or go to the ED. Diet/ fluid restrictions, taking all his mediations as prescribed and attending all medical appointments. Patient verbalized his understanding, a HF TOC appointment was scheduled for 09/07/2022 @ 12 noon.   ECHO/ LVEF: 25 % HFrEF  Clinical Course:  Past Medical History:  Diagnosis Date   Anemia    Diabetes mellitus without complication (HCC)    Family history of breast cancer    Hypertension    Myocardial infarction (HCC)    Prostate cancer (HCC)      Social History   Socioeconomic History   Marital status: Single    Spouse name: Not on file   Number of children: 3   Years of education: Not on file   Highest education level: Not on file  Occupational History   Not on file  Tobacco Use   Smoking status: Former    Packs/day: 1.00    Years: 43.00    Additional pack years: 0.00    Total pack years: 43.00    Types: Cigarettes    Quit date: 05/22/2017    Years since quitting: 5.2   Smokeless tobacco: Never  Vaping Use   Vaping Use: Never used  Substance and Sexual Activity   Alcohol use: Not Currently    Comment: last drink 2015   Drug use: Not Currently    Comment: none in 7 years   Sexual activity: Not Currently  Other Topics Concern   Not on file  Social History Narrative   3 sons   Social Determinants of Health   Financial Resource Strain: Not on file  Food  Insecurity: Not on file  Transportation Needs: Not on file  Physical Activity: Not on file  Stress: Not on file  Social Connections: Not on file   Education Assessment and Provision:  Detailed education and instructions provided on heart failure disease management including the following:  Signs and symptoms of Heart Failure When to call the physician Importance of daily weights Low sodium diet Fluid restriction Medication management Anticipated future follow-up appointments  Patient education given on each of the above topics.  Patient acknowledges understanding via teach back method and acceptance of all instructions.  Education Materials:  "Living Better With Heart Failure" Booklet, HF zone tool, & Daily Weight Tracker Tool.  Patient has scale at home: yes Patient has pill box at home: Yes    High Risk Criteria for Readmission and/or Poor Patient Outcomes: Heart failure hospital admissions (last 6 months): 1  No Show rate: 2 % Difficult social situation: No Demonstrates medication adherence: No, due to costs Primary Language: English Literacy level: Reading, writing, and comprehension  Barriers of Care:   Medication compliance ( costs)  Diet/ fluids restrictions Daily weights  Considerations/Referrals:   Referral made to Heart Failure Pharmacist Stewardship: yes Referral made to Heart Failure CSW/NCM TOC: No Referral made to Heart & Vascular TOC clinic: Yes, 09/07/2022 @ 12 noon.   Items for Follow-up  on DC/TOC: Medication compliance (costs)  Diet/ fluid restrictions Daily weights Continued HF education.    Rhae Hammock, BSN, Scientist, clinical (histocompatibility and immunogenetics) Only

## 2022-08-19 NOTE — Inpatient Diabetes Management (Addendum)
Inpatient Diabetes Program Recommendations  AACE/ADA: New Consensus Statement on Inpatient Glycemic Control (2015)  Target Ranges:  Prepandial:   less than 140 mg/dL      Peak postprandial:   less than 180 mg/dL (1-2 hours)      Critically ill patients:  140 - 180 mg/dL   Lab Results  Component Value Date   GLUCAP 258 (H) 08/19/2022   HGBA1C 13.6 (H) 08/18/2022    Latest Reference Range & Units 08/18/22 18:04 08/18/22 21:37 08/19/22 06:25  Glucose-Capillary 70 - 99 mg/dL 782 (H) 956 (H) 213 (H)  (H): Data is abnormally high  Diabetes history: DM2 Outpatient Diabetes medications: Glucotrol 5 mg bid, Metformin 500 mg bid, Farxiga 10 mg qd Current orders for Inpatient glycemic control: Semglee 16 units qd, Novolog 0-15 units tid correction  Inpatient Diabetes Program Recommendations:   Current A1c 13.6 (average blood glucose 344) Noted A1C was 8.1% 1 year ago and has been taking hormone injections for prostate cancer treatment and not taking his oral diabetes medications regularly. Ordered booklet Living Well With Diabetes and insulin pen starter kit and will plan to teach insulin administration.  Nurses, please allow patient to start giving his own injections and educate on insulin injections while in the hospital to prepare for discharge.  Educated patient on insulin pen use at home. Reviewed contents of insulin flexpen starter kit. Reviewed all steps if insulin pen including attachment of needle, 2-unit air shot, dialing up dose, giving injection, removing needle, disposal of sharps, storage of unused insulin, disposal of insulin etc. Patient able to provide successful return demonstration. Also reviewed troubleshooting with insulin pen. MD to give patient Rxs for insulin pens and insulin pen needles.  Per pharmacy check: Lantus Pens copay is $23.16 Novolog Flex pen copay is $33.56   Discharge needs: -CBG meter and supplies #[id number] -Insulin Pen Needles #[id number] -Lantus Insulin pen  #[id number] -Novolog pen [phone] (if ordered)  Thank you, Darel Hong E. Kamare Caspers, RN, MSN, CDE  Diabetes Coordinator Inpatient Glycemic Control Team Team Pager [phone] (8am-5pm) 08/19/2022 9:57 AM

## 2022-08-19 NOTE — Progress Notes (Signed)
Mobility Specialist: Progress Note   08/19/22 1429  Mobility  Activity Ambulated with assistance in hallway  Level of Assistance Minimal assist, patient does 75% or more  Assistive Device None  Distance Ambulated (ft) 280 ft  Activity Response Tolerated well  Mobility Referral Yes  $Mobility charge 1 Mobility   Pre-Mobility: 93 HR, 95% SpO2 During Mobility: 95% SpO2 Post-Mobility: 92 HR, 100% SpO2  Pt received in the bed and agreeable to mobility. Mod I with bed mobility and contact guard during ambulation. LOB x1 upon standing requiring minA to correct. No c/o throughout. Pt sitting EOB after session with call bell and phone at his side.   Lilac Hoff Mobility Specialist Please contact via SecureChat or Rehab office at [phone]

## 2022-08-19 NOTE — Progress Notes (Signed)
   Heart Failure Stewardship Pharmacist Progress Note   PCP: [name], MD PCP-Cardiologist: [name], MD    HPI:  62 yo M with PMH of CHF, CAD, HTN, prostate cancer with urinary incontinence, and medication noncompliance.  History of heart failure dating back to 05/2020 when he was hospitalized and ECHO showed EF 25-30%. R/LHC that admission showed single vessel CAD involving a ramus intermediate branch with normal filling pressures and preserved cardiac output. LV dysfunction was out of proportion to degree of CAD. Started on spironolactone and Farxiga for NICM. Medications were optimized as outpatient with addition of metoprolol XL and Entresto.  Repeat ECHO in 01/2021 showed improvement in EF to 45-50%.  Presented to the ED on 4/18 with shortness of breath, LE edema, weakness, and fatigue. CXR with bilateral interstitial opacities reflecting edema or infection. ECHO is pending.  Current HF Medications: ACE/ARB/ARNI: Entresto 49/51 mg BID  Prior to admission HF Medications: None - has not taken medications consistently over the last few months  Pertinent Lab Values: Serum creatinine 1.20, BUN 16, Potassium 4.0, Sodium 130, BNP 1311.2, Magnesium 1.7, A1c 13.6   Vital Signs: Weight: 174 lbs (admission weight: 174 lbs) Blood pressure: 110/60s  Heart rate: 90s  I/O: incomplete  Medication Assistance / Insurance Benefits Check: Does the patient have prescription insurance?  Yes Type of insurance plan: Financial risk analyst  Outpatient Pharmacy:  [facility] Is the patient willing to use [facility] TOC pharmacy at discharge? Yes Is the patient willing to transition their outpatient pharmacy to utilize a [facility] outpatient pharmacy?   Yes - transitioning to [facility] mail order    Assessment: 1. Acute on chronic systolic and diastolic HFimpEF due to NICM. Repeat ECHO pending. NYHA class III symptoms. - Minimal LE edema on exam. Off IV  lasix. No shortness of breath at rest or when laying flat. - Consider restarting metoprolol XL 25 mg daily and spironolactone 12.5 mg daily - Continue Entresto 49/51 mg BID - Hold Farxiga with urinary incontinence and elevated A1c   Plan: 1) Medication changes recommended at this time: - Add metoprolol XL 25 mg daily - Add spironolactone 12.5 mg daily  2) Patient assistance: Sherryll Burger copay [phone] - copay card lowers to $10 per fill - Farxiga copay $139 - copay card lowers to $0 per month - Will obtain Entresto copay card and add to [facility] records so he can use for refills - Patient agreeable to switching to [facility] mail order pharmacy given his issues with pharmacy copays in the past and not receiving help when he asked for it  3)  Education  - Patient has been educated on current HF medications and potential additions to HF medication regimen - Patient verbalizes understanding that over the next few months, these medication doses may change and more medications may be added to optimize HF regimen - Patient has been educated on basic disease state pathophysiology and goals of therapy   Sharen Hones, PharmD, BCPS Heart Failure Stewardship Pharmacist Phone [phone]

## 2022-08-20 ENCOUNTER — Other Ambulatory Visit: Payer: Self-pay | Admitting: Internal Medicine

## 2022-08-20 DIAGNOSIS — I5023 Acute on chronic systolic (congestive) heart failure: Secondary | ICD-10-CM | POA: Diagnosis not present

## 2022-08-20 DIAGNOSIS — Z87891 Personal history of nicotine dependence: Secondary | ICD-10-CM | POA: Diagnosis not present

## 2022-08-20 DIAGNOSIS — I110 Hypertensive heart disease with heart failure: Secondary | ICD-10-CM | POA: Diagnosis not present

## 2022-08-20 LAB — BASIC METABOLIC PANEL
Anion gap: 9 (ref 5–15)
BUN: 17 mg/dL (ref 8–23)
CO2: 23 mmol/L (ref 22–32)
Calcium: 8.3 mg/dL — ABNORMAL LOW (ref 8.9–10.3)
Chloride: 98 mmol/L (ref 98–111)
Creatinine, Ser: 1.1 mg/dL (ref 0.61–1.24)
GFR, Estimated: >60 mL/min (ref >60)
Glucose, Bld: 134 mg/dL — ABNORMAL HIGH (ref 70–99)
Potassium: 3.7 mmol/L (ref 3.5–5.1)
Sodium: 130 mmol/L — ABNORMAL LOW (ref 135–145)

## 2022-08-20 LAB — GLUCOSE, CAPILLARY
Glucose-Capillary: 158 mg/dL — ABNORMAL HIGH (ref 70–99)
Glucose-Capillary: 252 mg/dL — ABNORMAL HIGH (ref 70–99)

## 2022-08-20 LAB — CBC
HCT: 34.8 % — ABNORMAL LOW (ref 39.0–52.0)
Hemoglobin: 11.8 g/dL — ABNORMAL LOW (ref 13.0–17.0)
MCH: 29 pg (ref 26.0–34.0)
MCHC: 33.9 g/dL (ref 30.0–36.0)
MCV: 85.5 fL (ref 80.0–100.0)
Platelets: 340 10*3/uL (ref 150–400)
RBC: 4.07 MIL/uL — ABNORMAL LOW (ref 4.22–5.81)
RDW: 12.9 % (ref 11.5–15.5)
WBC: 6.1 10*3/uL (ref 4.0–10.5)
nRBC: 0 % (ref 0.0–0.2)

## 2022-08-20 MED ORDER — GABAPENTIN 300 MG PO CAPS: 300.0000 mg | ORAL_CAPSULE | Freq: Two times a day (BID) | ORAL | 0 refills | Status: DC

## 2022-08-20 MED ORDER — ATORVASTATIN CALCIUM 40 MG PO TABS: ORAL_TABLET | Freq: Every day | ORAL | 0 refills | Status: DC

## 2022-08-20 MED ORDER — TAMSULOSIN HCL 0.4 MG PO CAPS: 0.4000 mg | ORAL_CAPSULE | Freq: Every day | ORAL | 0 refills | Status: DC

## 2022-08-20 MED ORDER — ACCU-CHEK SOFTCLIX LANCETS MISC: 0 refills | Status: DC

## 2022-08-20 MED ORDER — SACUBITRIL-VALSARTAN 49-51 MG PO TABS: 1.0000 | ORAL_TABLET | Freq: Two times a day (BID) | ORAL | 0 refills | Status: DC

## 2022-08-20 MED ORDER — INSULIN GLARGINE 100 UNIT/ML SOLOSTAR PEN: 16.0000 [IU] | PEN_INJECTOR | Freq: Every day | SUBCUTANEOUS | 0 refills | Status: DC

## 2022-08-20 MED ORDER — METFORMIN HCL 500 MG PO TABS: 500.0000 mg | ORAL_TABLET | Freq: Two times a day (BID) | ORAL | 0 refills | Status: DC

## 2022-08-20 MED ORDER — METOPROLOL SUCCINATE ER 25 MG PO TB24: ORAL_TABLET | ORAL | 0 refills | Status: DC

## 2022-08-20 MED ORDER — BLOOD GLUCOSE MONITOR KIT: PACK | 0 refills | Status: DC

## 2022-08-20 NOTE — Plan of Care (Signed)
  Problem: Education: Goal: Ability to describe self-care measures that may prevent or decrease complications (Diabetes Survival Skills Education) will improve Outcome: Adequate for Discharge Goal: Individualized Educational Video(s) Outcome: Adequate for Discharge   Problem: Coping: Goal: Ability to adjust to condition or change in health will improve Outcome: Adequate for Discharge   Problem: Fluid Volume: Goal: Ability to maintain a balanced intake and output will improve Outcome: Adequate for Discharge   Problem: Health Behavior/Discharge Planning: Goal: Ability to identify and utilize available resources and services will improve Outcome: Adequate for Discharge Goal: Ability to manage health-related needs will improve Outcome: Adequate for Discharge   Problem: Nutritional: Goal: Maintenance of adequate nutrition will improve Outcome: Adequate for Discharge Goal: Progress toward achieving an optimal weight will improve Outcome: Adequate for Discharge   Problem: Skin Integrity: Goal: Risk for impaired skin integrity will decrease Outcome: Adequate for Discharge   Problem: Tissue Perfusion: Goal: Adequacy of tissue perfusion will improve Outcome: Adequate for Discharge

## 2022-08-20 NOTE — Progress Notes (Signed)
Nursing Discharge Note   Admit Date: 08/18/2022  Discharge date: 08/20/2022   Derek Thompson is to be discharged home per MD order.  AVS completed. Reviewed with patient at bedside. Highlighted copy provided for patient to take home.  Patient able to verbalize understanding of discharge instructions. PIV removed. Patient stable upon discharge.   Patient utilizing taxi voucher for transportation home. Therapist, nutritional and Release of Liability form signed by patient and placed in chart per SW Reandra request.   Discharge Instructions     (HEART FAILURE PATIENTS) Call MD:  Anytime you have any of the following symptoms: 1) 3 pound weight gain in 24 hours or 5 pounds in 1 week 2) shortness of breath, with or without a dry hacking cough 3) swelling in the hands, feet or stomach 4) if you have to sleep on extra pillows at night in order to breathe.   Complete by: As directed    Call MD for:  persistant dizziness or light-headedness   Complete by: As directed    Diet - low sodium heart healthy   Complete by: As directed    Discharge instructions   Complete by: As directed    Dear Derek Thompson,  You were hospitalized because of heart failure (your heart is not squeezing as well as it used to). You will need to take a lot of medications, in order to help your heart.  Please take: 1. Entresto 49-51 mg twice a day 2. Metoprolol 25 mg once a day. These are medications for your heart and are very important to take everyday!  You will also need to take: 1. Metformin twice a day.  2. We started you on insulin and you should inject 16 units of lantus once a day.  Please check your blood sugars regularly too, and this will help [place] to adjust your insulin when you come see [place] in our clinic.   You should also be taking atorvastatin 40 mg everyday for your cholesterol.   If you have any questions or concerns, call our clinic at [phone] or after hours call [phone] and ask for the internal medicine  resident on call. If your medications are not affordable, please call us to let us know before your hospital follow up appointment. It is very important that you get these medications from [facility] pharmacy [facility] [address]) and take them everyday   You have an appointment in our clinic on 4/26 at 10:45 AM - we are located on the ground floor of the hospital.   Take care! It was a pleasure taking care of you.   Increase activity slowly   Complete by: As directed        Allergies as of 08/20/2022   No Known Allergies      Medication List     STOP taking these medications    diclofenac Sodium 1 % Gel Commonly known as: Voltaren   docusate sodium 100 MG capsule Commonly known as: Colace   Farxiga 10 MG Tabs tablet Generic drug: dapagliflozin propanediol   glipiZIDE 5 MG tablet Commonly known as: GLUCOTROL   oxyCODONE-acetaminophen 5-325 MG tablet Commonly known as: Percocet   spironolactone 25 MG tablet Commonly known as: ALDACTONE       TAKE these medications    Accu-Chek Softclix Lancets lancets SMARTSIG:Topical 1-4 Times Daily What changed:  how to take this additional instructions   atorvastatin 40 MG tablet Commonly known as: LIPITOR TAKE 1 TABLET (40 MG TOTAL) BY MOUTH DAILY.  blood glucose meter kit and supplies Kit Dispense based on patient and insurance preference. Use up to four times daily as directed. (FOR ICD-9 250.00, 250.01).   gabapentin 300 MG capsule Commonly known as: NEURONTIN Take 1 capsule (300 mg total) by mouth 2 (two) times daily.   insulin glargine 100 UNIT/ML Solostar Pen Commonly known as: LANTUS Inject 16 Units into the skin at bedtime.   metFORMIN 500 MG tablet Commonly known as: GLUCOPHAGE Take 1 tablet (500 mg total) by mouth 2 (two) times daily with a meal.   metoprolol succinate 25 MG 24 hr tablet Commonly known as: TOPROL-XL TAKE 1 TABLET(25 MG) BY MOUTH DAILY   multivitamin tablet Take 1 tablet by mouth  daily.   sacubitril-valsartan 49-51 MG Commonly known as: ENTRESTO Take 1 tablet by mouth 2 (two) times daily.   tamsulosin 0.4 MG Caps capsule Commonly known as: FLOMAX Take 1 capsule (0.4 mg total) by mouth at bedtime.         Discharge Instructions/ Education: Discharge instructions given to patient with verbalized understanding. Discharge education completed with patient including: follow up instructions, medication list, discharge activities, and limitations if indicated. Patient instructed to return to Emergency Department, call 911, or call MD for any changes in condition.  Patient escorted via wheelchair to lobby and discharged home via taxi.

## 2022-08-20 NOTE — Discharge Summary (Signed)
Name: Derek Thompson MRN: [id number] DOB: [birth year] 62 y.o. PCP: [name], MD  Date of Admission: 08/18/2022  8:20 AM Date of Discharge:  08/20/2022 Attending Physician: Dr. Heide Spark  DISCHARGE DIAGNOSIS:  Primary Problem: Acute on chronic heart failure   Hospital Problems: Principal Problem:   Acute on chronic heart failure   HFrEF Nonischemic cardiomyopathy Hypertension Uncontrolled type 2 diabetes Hyperlipidemia Prostate Cancer Normocytic anemia Hypoalbuminemia   DISCHARGE MEDICATIONS:   Allergies as of 08/20/2022   No Known Allergies      Medication List     STOP taking these medications    diclofenac Sodium 1 % Gel Commonly known as: Voltaren   docusate sodium 100 MG capsule Commonly known as: Colace   Farxiga 10 MG Tabs tablet Generic drug: dapagliflozin propanediol   glipiZIDE 5 MG tablet Commonly known as: GLUCOTROL   oxyCODONE-acetaminophen 5-325 MG tablet Commonly known as: Percocet   spironolactone 25 MG tablet Commonly known as: ALDACTONE       TAKE these medications    Accu-Chek Softclix Lancets lancets SMARTSIG:Topical 1-4 Times Daily What changed:  how to take this additional instructions   atorvastatin 40 MG tablet Commonly known as: LIPITOR TAKE 1 TABLET (40 MG TOTAL) BY MOUTH DAILY.   blood glucose meter kit and supplies Kit Dispense based on patient and insurance preference. Use up to four times daily as directed. (FOR ICD-9 250.00, 250.01).   gabapentin 300 MG capsule Commonly known as: NEURONTIN Take 1 capsule (300 mg total) by mouth 2 (two) times daily.   insulin glargine 100 UNIT/ML Solostar Pen Commonly known as: LANTUS Inject 16 Units into the skin at bedtime.   metFORMIN 500 MG tablet Commonly known as: GLUCOPHAGE Take 1 tablet (500 mg total) by mouth 2 (two) times daily with a meal.   metoprolol succinate 25 MG 24 hr tablet Commonly known as: TOPROL-XL TAKE 1 TABLET(25 MG) BY MOUTH DAILY    multivitamin tablet Take 1 tablet by mouth daily.   sacubitril-valsartan 49-51 MG Commonly known as: ENTRESTO Take 1 tablet by mouth 2 (two) times daily.   tamsulosin 0.4 MG Caps capsule Commonly known as: FLOMAX Take 1 capsule (0.4 mg total) by mouth at bedtime.        DISPOSITION AND FOLLOW-UP:  Derek Thompson was discharged from [facility] in Stable condition. At the hospital follow up visit please address:  HFrEF: EF 25%, grade II diastolic dysfunction Restarted entresto and metoprolol Held farxiga and spiro at discharge Refer to cardiology in outpatient setting Uncontrolled T2DM: A1c >13% Discharged on lantus 16u daily + metformin Taught how to check CBGs Iron deficiency: Start iron supplementation at [facility] Recommend referral to integrated behavioral health, as patient was interested in counseling/therapy   Follow-up Recommendations: Consults: Cardiology referral recommended Labs:  none Studies: none Medications:  Entresto Metoprolol Lantus 16u daily Atorvastatin 40 mg Flomax 0.4 mg   Follow-up Appointments:  Follow-up Information     [facility]. Go in 16 day(s).   Specialty: Cardiology Why: Hospital follow up 09/07/2022 @ 12 noon PLEASE bring a current medication list to appointment FREE valet parking, Entrance C, off [employer] information: [address] [id number] [place] [address] [phone]        Lyndle Herrlich, MD .   Contact information: [address] [name] [address] [phone]               HFU with Dr. Franciso Bend on 4/26  10:45  HOSPITAL COURSE:  Patient Summary: Derek Thompson is a 62 y/o male with past medical history of HTN, HLD, T2DM, CHF w/ G1DD, nonischemic cardiomyopathy, CAD, prostate cancer that presents for cough, exertional dyspnea, orthopnea and was admitted for acute on chronic heart failure.     #Acute on chronic CHF #HFrEF #Hypertension Patient has history of HFrEF. Echo from 01/2021 w/ EF 45-50% and G1DD.  Previously followed by [facility] cardiology, last appointment appears to be in 03/2021. Derek Thompson was supposed to be taking Farxiga 10 mg daily, Metoprolol 25 mg daily, Sacubitril-Valsartan 49-51 mg BID, and Spironolactone 12.5 mg daily, but has not taken these medications consistently for at least 4 months due to difficulty affording these medications and frustration regarding his health conditions. Derek Thompson initially presented with cough, exertional dyspnea, orthopnea, and minimal lower extremity swelling. Vitals were notable for the patient being hypertensive and tachycardic on admission, but Derek Thompson was saturating well on room air. Troponin WNL.  BNP elevated at 1311 and CXR demonstrated bilateral interstitial opacities most pronounced in the perihilar region, reflecting edema versus atypical/viral infection. EKG without signs of ischemia.  On initial exam, patient has minimal lower extremity edema and mild crackles in the lung bases.  Suspect symptoms are likely secondary to acute CHF exacerbation in the setting of medication nonadherence.  Patient was given IV Lasix 40 mg while in the ED, but urine output was not properly measured due to the patient wearing briefs (hx of urinary incontinence). Will obtain echo to assess cardiac function.  Given lack of leukocytosis or increased oxygen requirements, low suspicion for pneumonia at this time, and RVP was negative. Echo was repeated and showed EF of 25% with G2DD and LV global hypokinesis. Slowly added back GDMT. Patient was discharged with Entresto 49-51 mg BID and Metoprolol 25 mg daily. Consider adding back more GDMT, as blood pressure tolerates.    #Nonischemic cardiomyopathy #CAD #HLD Troponin WNL.  EKG without signs of ischemia.  Patient is supposed to be taking atorvastatin 40 mg daily at home.  Intermittently compliant with this medication. Lipid panel  with HDL 30, LDL 140 and ASCVD risk elevated to 14.5%. Restarted atorvastatin 40 mg daily.   #T2DM A1C was 8.1% 1 year ago.  Patient is supposed to be taking metformin 500 mg BID, glipizide 5 mg BID, and farxiga 10 mg daily at home.  Also takes gabapentin 300 mg BID at home. Has not taken his medications consistently over the last few months and A1c now elevated to 13.6%. Started on lantus 16u qhs and metformin- discharged with this as well, and the patient was taught how to inject insulin. Adjust regimen as needed in the outpatient setting.    #Prostate Cancer Patient underwent prostate biopsy in 07/2020 after found to have elevated PSA.  Biopsy demonstrated adenocarcinoma.  PET scan in 08/2020 demonstrated activity in the periphery of the prostate gland extending into the seminal vesicle. Underwent brachytherapy.  Followed previously by [facility] health radiation oncology as outpatient. Most recent visit appears to be in 04/2021. Completed radiation treatment at this time but did not present to follow up visit afterwards. Also follows with Dr. Cardell Peach (urology) as outpatient. No recent visits on file. Has chronic urinary incontinence at home secondary to prior treatment of prostate cancer.  Taking tamsulosin 0.4 mg daily intermittently at home. Suspect patient would benefit from outpatient follow-up with radiation oncology and urology.   #Normocytic anemia The patient endorses a long history of fatigue and CBC demonstrates normocytic anemia with hemoglobin  11.8.  Appears chronic since at least 2022.  No signs of bleeding at this time. Ferritin WNL at 94, but iron low at 33 and iron sat low at 12%. Start iron supplementation at hospital follow up.    #Hyponatremia Mild hyponatremia on admission of 132.  Likely in the setting of acute CHF exacerbation and pseudohyponatremia from elevated CBGs.   #Hypoalbuminemia Hepatic function panel with hypoalbuminemia at 2.9.  Potentially in the setting of protein  malnutrition.  #SDoH The patient states that Derek Thompson stopped taking all of his medications about 4 months ago due to cost + feeling overwhelmed by his diagnoses. Derek Thompson is interested in talking to a counselor/therapist at discharge and is also interested in establishing care with the [facility]. Will plan to refer patient to integrated behavioral health, Marena Chancy, at the [facility].     DISCHARGE INSTRUCTIONS:   Discharge Instructions     (HEART FAILURE PATIENTS) Call MD:  Anytime you have any of the following symptoms: 1) 3 pound weight gain in 24 hours or 5 pounds in 1 week 2) shortness of breath, with or without a dry hacking cough 3) swelling in the hands, feet or stomach 4) if you have to sleep on extra pillows at night in order to breathe.   Complete by: As directed    Call MD for:  persistant dizziness or light-headedness   Complete by: As directed    Diet - low sodium heart healthy   Complete by: As directed    Discharge instructions   Complete by: As directed    Dear Derek Thompson,  You were hospitalized because of heart failure (your heart is not squeezing as well as it used to). You will need to take a lot of medications, in order to help your heart.  Please take: 1. Entresto 49-51 mg twice a day 2. Metoprolol 25 mg once a day. These are medications for your heart and are very important to take everyday!  You will also need to take: 1. Metformin twice a day.  2. We started you on insulin and you should inject 16 units of lantus once a day.  Please check your blood sugars regularly too, and this will help [place] to adjust your insulin when you come see [place] in our clinic.   You should also be taking atorvastatin 40 mg everyday for your cholesterol.   If you have any questions or concerns, call our clinic at [phone] or after hours call [phone] and ask for the internal medicine resident on call. If your medications are not affordable, please call us to let us know before your hospital follow up  appointment. It is very important that you get these medications from your pharmacy today ([facility] on [address]) and take them everyday   You have an appointment in our clinic on 4/26 at 10:45 AM - we are located on the ground floor of the hospital.   Take care! It was a pleasure taking care of you.   Increase activity slowly   Complete by: As directed        SUBJECTIVE:  Derek Thompson was seen and evaluated on the day of discharge. Derek Thompson states that Derek Thompson has not felt this well in a while. Denies any chest pain or shortness of breath.  Discharge Vitals:   BP 112/72 (BP Location: Left Arm)   Pulse 91   Temp 98.5 F (36.9 C) (Oral)   Resp 19   Ht 6' (1.829 m)   Wt 79.4 kg  SpO2 98%   BMI 23.74 kg/m   OBJECTIVE:  General: Pleasant, well-appearing male ambulating throughout room. No acute distress. CV: RRR. No murmurs. No LE edema Pulmonary: Lungs CTAB. Normal effort. No wheezing or rales. Abdominal: Soft, nontender, nondistended. Extremities: Palpable radial and DP pulses. Normal ROM. Skin: Warm and dry.  Neuro: A&Ox3. Moves all extremities. Normal sensation. No focal deficit. Psych: Normal mood and affect   Pertinent Labs, Studies, and Procedures:     Latest Ref Rng & Units 08/20/2022   12:21 AM 08/19/2022   12:22 AM 08/18/2022    8:52 AM  CBC  WBC 4.0 - 10.5 K/uL 6.1  8.3  7.1   Hemoglobin 13.0 - 17.0 g/dL 16.1  09.6  04.5   Hematocrit 39.0 - 52.0 % 34.8  34.1  36.0   Platelets 150 - 400 K/uL 340  346  373        Latest Ref Rng & Units 08/20/2022   12:21 AM 08/19/2022   12:22 AM 08/18/2022    8:52 AM  CMP  Glucose 70 - 99 mg/dL 409  811  914   BUN 8 - 23 mg/dL Creatinine 0.61 - 1.24 mg/dL 7.82  9.56  2.13   Sodium 135 - 145 mmol/L 130  130  132   Potassium 3.5 - 5.1 mmol/L 3.7  4.0  4.4   Chloride 98 - 111 mmol/L 98  97  99   CO2 22 - 32 mmol/L Calcium 8.9 - 10.3 mg/dL 8.3  8.6  8.9   Total Protein 6.5 - 8.1 g/dL   7.2   Total Bilirubin  0.3 - 1.2 mg/dL   0.7   Alkaline Phos 38 - 126 U/L   45   AST 15 - 41 U/L   13   ALT 0 - 44 U/L   11     ECHOCARDIOGRAM COMPLETE  Result Date: 08/19/2022    ECHOCARDIOGRAM REPORT   Patient Name:   Derek Thompson Date of Exam: 08/19/2022 Medical Rec #:  [id number]     Height:       72.0 in Accession #:    [id number]    Weight:       174.6 lb Date of Birth:  24-Sep-1960     BSA:          2.011 m Patient Age:    62 years      BP:           116/66 mmHg Patient Gender: M             HR:           76 bpm. Exam Location:  Inpatient Procedure: 2D Echo, Cardiac Doppler, Color Doppler, 3D Echo and Strain Analysis Indications:    CHF I50.21  History:        Patient has prior history of Echocardiogram examinations, most                 recent 02/09/2021. CHF, CAD; Risk Factors:Hypertension,                 Diabetes, Dyslipidemia and Former Smoker.  Sonographer:    Dondra Prader RVT RCS Referring Phys: [id number] [facility] PFEIFFER  Sonographer Comments: Heart rate was not reading correctly. HR was 76 bpm IMPRESSIONS  1. Left ventricular ejection fraction, by estimation, is 25%. The left ventricle has severely decreased function. The left ventricle demonstrates global hypokinesis. Left  ventricular diastolic parameters are consistent with Grade II diastolic dysfunction (pseudonormalization).  2. Right ventricular systolic function is mildly reduced. The right ventricular size is normal. Tricuspid regurgitation signal is inadequate for assessing PA pressure.  3. Left atrial size was mildly dilated.  4. The mitral valve is normal in structure. Mild mitral valve regurgitation. No evidence of mitral stenosis.  5. The aortic valve is tricuspid. Aortic valve regurgitation is not visualized. No aortic stenosis is present.  6. The inferior vena cava is normal in size with greater than 50% respiratory variability, suggesting right atrial pressure of 3 mmHg. FINDINGS  Left Ventricle: Left ventricular ejection fraction, by estimation, is 25%.  The left ventricle has severely decreased function. The left ventricle demonstrates global hypokinesis. The left ventricular internal cavity size was normal in size. There is no left ventricular hypertrophy. Left ventricular diastolic parameters are consistent with Grade II diastolic dysfunction (pseudonormalization). Right Ventricle: The right ventricular size is normal. No increase in right ventricular wall thickness. Right ventricular systolic function is mildly reduced. Tricuspid regurgitation signal is inadequate for assessing PA pressure. Left Atrium: Left atrial size was mildly dilated. Right Atrium: Right atrial size was normal in size. Pericardium: Trivial pericardial effusion is present. Mitral Valve: The mitral valve is normal in structure. Mild mitral valve regurgitation. No evidence of mitral valve stenosis. Tricuspid Valve: The tricuspid valve is normal in structure. Tricuspid valve regurgitation is not demonstrated. Aortic Valve: The aortic valve is tricuspid. Aortic valve regurgitation is not visualized. No aortic stenosis is present. Aortic valve mean gradient measures 2.0 mmHg. Aortic valve peak gradient measures 3.2 mmHg. Aortic valve area, by VTI measures 2.25 cm. Pulmonic Valve: The pulmonic valve was normal in structure. Pulmonic valve regurgitation is not visualized. Aorta: The aortic root is normal in size and structure. Venous: The inferior vena cava is normal in size with greater than 50% respiratory variability, suggesting right atrial pressure of 3 mmHg. IAS/Shunts: No atrial level shunt detected by color flow Doppler.  LEFT VENTRICLE PLAX 2D LVIDd:         5.50 cm LVIDs:         4.50 cm LV PW:         1.00 cm LV IVS:        1.00 cm LVOT diam:     1.80 cm      3D Volume EF: LV SV:         35           3D EF:        38 % LV SV Index:   17           LV EDV:       203 ml LVOT Area:     2.54 cm     LV ESV:       126 ml                             LV SV:        77 ml  LV Volumes (MOD) LV vol  d, MOD A2C: 202.0 ml LV vol d, MOD A4C: 91.6 ml LV vol s, MOD A2C: 150.0 ml LV vol s, MOD A4C: 84.5 ml LV SV MOD A2C:     52.0 ml LV SV MOD A4C:     91.6 ml LV SV MOD BP:      32.3 ml RIGHT VENTRICLE  IVC RV Basal diam:  3.60 cm     IVC diam: 1.50 cm RV Mid diam:    2.90 cm RV S prime:     10.60 cm/s TAPSE (M-mode): 2.0 cm LEFT ATRIUM           Index        RIGHT ATRIUM           Index LA diam:      4.30 cm 2.14 cm/m   RA Area:     14.20 cm LA Vol (A2C): 44.9 ml 22.33 ml/m  RA Volume:   33.60 ml  16.71 ml/m LA Vol (A4C): 44.3 ml 22.03 ml/m  AORTIC VALVE                    PULMONIC VALVE AV Area (Vmax):    2.33 cm     PV Vmax:       0.83 m/s AV Area (Vmean):   2.23 cm     PV Peak grad:  2.8 mmHg AV Area (VTI):     2.25 cm AV Vmax:           89.80 cm/s AV Vmean:          62.200 cm/s AV VTI:            0.155 m AV Peak Grad:      3.2 mmHg AV Mean Grad:      2.0 mmHg LVOT Vmax:         82.10 cm/s LVOT Vmean:        54.500 cm/s LVOT VTI:          0.137 m LVOT/AV VTI ratio: 0.88  AORTA Ao Root diam: 3.10 cm Ao Asc diam:  3.00 cm Ao Arch diam: 2.3 cm MITRAL VALVE MV Area (PHT): 5.14 cm    SHUNTS MV Decel Time: 148 msec    Systemic VTI:  0.14 m MV E velocity: 92.15 cm/s  Systemic Diam: 1.80 cm MV A velocity: 66.35 cm/s MV E/A ratio:  1.39 Dalton McleanMD Electronically signed by Wilfred Lacy Signature Date/Time: 08/19/2022/3:18:06 PM    Final    DG Chest 2 View  Result Date: 08/18/2022 CLINICAL DATA:  Cough, shortness of breath EXAM: CHEST - 2 VIEW COMPARISON:  12/03/2020 FINDINGS: The heart size and mediastinal contours are within normal limits. Aortic atherosclerosis. Bilateral interstitial opacities, most pronounced within the perihilar regions. No pleural effusion or pneumothorax. The visualized skeletal structures are unremarkable. IMPRESSION: Bilateral interstitial opacities, most pronounced within the perihilar regions. Findings may reflect edema or atypical/viral infection. Electronically  Signed   By: Duanne Guess D.O.   On: 08/18/2022 09:16     Signed: Elza Rafter, D.O.  Internal Medicine Resident, PGY-2 [facility]  Pager: [phone] 11:55 AM, 08/20/2022    \

## 2022-08-20 NOTE — Progress Notes (Signed)
Morning medications administered as ordered at this time. Patient able to self inject insulin dose for this morning independently and appropriately at this time. Reinforced diabetic education.

## 2022-08-26 ENCOUNTER — Ambulatory Visit: Payer: BC Managed Care – PPO

## 2022-08-26 VITALS — BP 139/86 | HR 101 | Ht 72.0 in | Wt 186.6 lb

## 2022-08-26 DIAGNOSIS — C61 Malignant neoplasm of prostate: Secondary | ICD-10-CM

## 2022-08-26 DIAGNOSIS — I2510 Atherosclerotic heart disease of native coronary artery without angina pectoris: Secondary | ICD-10-CM

## 2022-08-26 DIAGNOSIS — R5381 Other malaise: Secondary | ICD-10-CM | POA: Diagnosis not present

## 2022-08-26 DIAGNOSIS — I5021 Acute systolic (congestive) heart failure: Secondary | ICD-10-CM

## 2022-08-26 DIAGNOSIS — Z794 Long term (current) use of insulin: Secondary | ICD-10-CM

## 2022-08-26 DIAGNOSIS — E785 Hyperlipidemia, unspecified: Secondary | ICD-10-CM

## 2022-08-26 DIAGNOSIS — Z7984 Long term (current) use of oral hypoglycemic drugs: Secondary | ICD-10-CM

## 2022-08-26 DIAGNOSIS — E118 Type 2 diabetes mellitus with unspecified complications: Secondary | ICD-10-CM

## 2022-08-26 DIAGNOSIS — D508 Other iron deficiency anemias: Secondary | ICD-10-CM

## 2022-08-26 DIAGNOSIS — R4589 Other symptoms and signs involving emotional state: Secondary | ICD-10-CM

## 2022-08-26 DIAGNOSIS — Z0000 Encounter for general adult medical examination without abnormal findings: Secondary | ICD-10-CM

## 2022-08-26 DIAGNOSIS — F329 Major depressive disorder, single episode, unspecified: Secondary | ICD-10-CM

## 2022-08-26 MED ORDER — FERROUS SULFATE 325 (65 FE) MG PO TABS: 325.0000 mg | ORAL_TABLET | Freq: Every day | ORAL | 1 refills | Status: DC

## 2022-08-26 NOTE — Patient Instructions (Signed)
Derek Thompson, it was a pleasure seeing you today! You endorsed feeling well today. Below are some of the things we talked about this visit. We look forward to seeing you in the follow up appointment!  Today we discussed: Heart failure: Please continue taking your entresto and metoprolol. I will call with lab results and put in a referral for you to follow up with your cardiologist. If your labs look good we will try to get your back on your prior meds. I will look into cost  Diabetes: continue your current regimen of lantus and metformin. I put in for you to meet with our diabetes coordinator donna who will try and get you a glucose monitor. In the meantime, I put in for supplies for your meter. Please check your sugar every monring before you eat  I put in a physical therapy referral  I sent a script for iron supplements to your pharmacy.  I have ordered the following labs today:   Lab Orders         BMP8+Anion Gap         Microalbumin / Creatinine Urine Ratio       Referrals ordered today:    Referral Orders         Ambulatory referral to Integrated Behavioral Health         Ambulatory referral to Cardiology      I have ordered the following medication/changed the following medications:   Stop the following medications: There are no discontinued medications.   Start the following medications: Meds ordered this encounter  Medications   ferrous sulfate 325 (65 FE) MG tablet    Sig: Take 1 tablet (325 mg total) by mouth daily.    Dispense:  30 tablet    Refill:  1     Follow-up:  4 weeks CHF, glucose, try to meet with donna    Please make sure to arrive 15 minutes prior to your next appointment. If you arrive late, you may be asked to reschedule.   We look forward to seeing you next time. Please call our clinic at [phone] if you have any questions or concerns. The best time to call is Monday-Friday from 9am-4pm, but there is someone available 24/7. If after hours  or the weekend, call the main hospital number and ask for the Internal Medicine Resident On-Call. If you need medication refills, please notify your pharmacy one week in advance and they will send [place] a request.  Thank you for letting us take part in your care. Wishing you the best!  Thank you, Lyndle Herrlich MD

## 2022-08-27 DIAGNOSIS — F329 Major depressive disorder, single episode, unspecified: Secondary | ICD-10-CM | POA: Insufficient documentation

## 2022-08-27 DIAGNOSIS — Z0000 Encounter for general adult medical examination without abnormal findings: Secondary | ICD-10-CM | POA: Insufficient documentation

## 2022-08-27 DIAGNOSIS — D509 Iron deficiency anemia, unspecified: Secondary | ICD-10-CM | POA: Insufficient documentation

## 2022-08-27 LAB — MICROALBUMIN / CREATININE URINE RATIO
Creatinine, Urine: 87.4 mg/dL
Microalb/Creat Ratio: 666 mg/g creat — ABNORMAL HIGH (ref 0–29)
Microalbumin, Urine: 582.5 ug/mL

## 2022-08-27 MED ORDER — DEXCOM G7 RECEIVER DEVI: 0 refills | Status: DC

## 2022-08-27 MED ORDER — DEXCOM G7 SENSOR MISC: 3 refills | Status: DC

## 2022-08-27 NOTE — Assessment & Plan Note (Signed)
Starting iron supplementation today, will need to get colonoscopy, will discuss at next visit

## 2022-08-27 NOTE — Assessment & Plan Note (Signed)
History of HFrEF. Echo from 01/2021 w/ EF 45-50% and G1DD. Previously followed by [facility] cardiology, last appointment appears to be in 03/2021. He was supposed to be taking Farxiga 10 mg daily, Metoprolol 25 mg daily, Sacubitril-Valsartan 49-51 mg BID, and Spironolactone 12.5 mg daily, but has not taken these medications consistently for at least 4 months due to difficulty affording these medications and frustration regarding his health conditions. Presented to ED and admitted for Chf exacerbation. Echo was repeated and showed EF of 25% with G2DD and LV global hypokinesis. Diuresed and GDMT slowly restarted with entresto 49-51 and toprol 25mg . He has been doing well since d/c with no increase in orthopnea, PND, or BLE edema. Volume exam unremarkable. Has concerns about affordability of meds, specifically entresto.  -will reach out to [facility] about medication assistance, also recommend checking eligbility for medicaid -continue current GDMT. Will recheck labs and start jardiance if kidney function is stable. Consider adding on MRA too down the line.  -cardiology referral to follow up -PT for deconditioning, also eligible for cardiac rehab

## 2022-08-27 NOTE — Progress Notes (Signed)
   CC: hospital follow up  HPI:  [name] is a 62 y.o.-year-old male with past medical history as below presenting for hospital follow up.  Please see encounters tab for problem-based charting.  Past Medical History:  Diagnosis Date   Anemia    Diabetes mellitus without complication (HCC)    Family history of breast cancer    Hypertension    Myocardial infarction [facility])    Prostate cancer (HCC)    Review of Systems: As in HPI.  Please see encounters tab for problem based charting.  Physical Exam:  Vitals:   08/26/22 1122  BP: 139/86  Pulse: (!) 101  SpO2: 98%  Weight: 186 lb 9.6 oz (84.6 kg)  Height: 6' (1.829 m)   General:Well-appearing, pleasant, In NAD Cardiac: RRR, no murmurs rubs or gallops. Trace BLE pitting edema up to ankles. No JVD Respiratory: Normal work of breathing on room air, CTAB Abdominal: Soft, nontender, nondistended   Assessment & Plan:   Acute systolic CHF (congestive heart failure), NYHA class 3 (HCC) History of HFrEF. Echo from 01/2021 w/ EF 45-50% and G1DD. Previously followed by [facility] cardiology, last appointment appears to be in 03/2021. He was supposed to be taking Farxiga 10 mg daily, Metoprolol 25 mg daily, Sacubitril-Valsartan 49-51 mg BID, and Spironolactone 12.5 mg daily, but has not taken these medications consistently for at least 4 months due to difficulty affording these medications and frustration regarding his health conditions. Presented to ED and admitted for Chf exacerbation. Echo was repeated and showed EF of 25% with G2DD and LV global hypokinesis. Diuresed and GDMT slowly restarted with entresto 49-51 and toprol 25mg . He has been doing well since d/c with no increase in orthopnea, PND, or BLE edema. Volume exam unremarkable. Has concerns about affordability of meds, specifically entresto.  -will reach out to [facility] about medication assistance, also recommend checking eligbility for medicaid -continue current GDMT. Will recheck  labs and start jardiance if kidney function is stable. Consider adding on MRA too down the line.  -cardiology referral to follow up -PT for deconditioning, also eligible for cardiac rehab  Type 2 diabetes mellitus with complication, without long-term current use of insulin (HCC) Significant increase in A1c with not taking previously prescribed meds for a few months due to cost. Gabapentin 300 BID for neuropathy. No vision changes, other symptoms. -lantus 16 units QHS + metformin 500 BID -refer to Lupita Leash for education on insulin + coaching on glucose checks, will try to get CGM as well. Check at least fasting daily with meter until then -gabapentin 300 BID.  -Follows with opththalmology already, need to get records -microalbumin/Cr today   Malignant neoplasm of prostate [facility]) Follows with urology  Dyslipidemia On lipitor 40  Iron deficiency anemia Starting iron supplementation today, will need to get colonoscopy, will discuss at next visit  MDD (major depressive disorder) Requested to speak to therapist -integrated behavioral health referral  Healthcare maintenance Please address screening emasures at [facility]   Patient discussed with Dr. Mayford Knife

## 2022-08-27 NOTE — Assessment & Plan Note (Addendum)
Significant increase in A1c with not taking previously prescribed meds for a few months due to cost. Gabapentin 300 BID for neuropathy. No vision changes, other symptoms. -lantus 16 units QHS + metformin 500 BID -refer to Lupita Leash for education on insulin + coaching on glucose checks, will try to get CGM as well. Check at least fasting daily with meter until then -gabapentin 300 BID.  -Follows with opththalmology already, need to get records -microalbumin/Cr today

## 2022-08-27 NOTE — Assessment & Plan Note (Signed)
On lipitor 40

## 2022-08-27 NOTE — Assessment & Plan Note (Signed)
Follows with urology. 

## 2022-08-27 NOTE — Assessment & Plan Note (Signed)
Requested to speak to therapist -integrated behavioral health referral

## 2022-08-27 NOTE — Assessment & Plan Note (Signed)
Please address screening emasures at [facility]

## 2022-08-29 LAB — BMP8+ANION GAP
Anion Gap: 14 mmol/L (ref 10.0–18.0)
BUN/Creatinine Ratio: 13 (ref 10–24)
BUN: 15 mg/dL (ref 8–27)
CO2: 20 mmol/L (ref 20–29)
Calcium: 9.5 mg/dL (ref 8.6–10.2)
Chloride: 99 mmol/L (ref 96–106)
Creatinine, Ser: 1.12 mg/dL (ref 0.76–1.27)
Glucose: 238 mg/dL — ABNORMAL HIGH (ref 70–99)
Potassium: 4.9 mmol/L (ref 3.5–5.2)
Sodium: 133 mmol/L — ABNORMAL LOW (ref 134–144)
eGFR: 74 mL/min/{1.73_m2} (ref >59)

## 2022-08-30 ENCOUNTER — Telehealth: Payer: Self-pay

## 2022-08-30 ENCOUNTER — Other Ambulatory Visit (HOSPITAL_COMMUNITY): Payer: Self-pay

## 2022-08-30 MED ORDER — EMPAGLIFLOZIN 10 MG PO TABS
10.0000 mg | ORAL_TABLET | Freq: Every day | ORAL | 2 refills | Status: DC
Start: 2022-08-30 — End: 2022-09-07

## 2022-08-30 NOTE — Telephone Encounter (Signed)
-----   Message from Lyndle Herrlich, MD sent at 08/26/2022  4:00 PM EDT ----- Jolene Schimke,  I saw this patient earlier today in clinic and he was wondering if there was anything we could do to help with the cost of his medicines. Specifically, his entresto. He has private insurance and I told him to look into medicaid but I'm not sure if he'd be eligible. Does he qualify for medication assistance?  Thanks,  [place]

## 2022-08-30 NOTE — Telephone Encounter (Signed)
Pt not eligible for patient assistance - commerical coverage.  Attemped to call pharmacy to provide [facility] copay card, but pharmacy says patient will need to bring in the card himself due to unsecure phone lines (not able to give over the phone).   Will call patient with the following info:

## 2022-08-30 NOTE — Addendum Note (Signed)
Addended byLyndle Herrlich on: 08/30/2022 10:59 AM   Modules accepted: Orders

## 2022-08-30 NOTE — Telephone Encounter (Signed)
Left message for patient regarding copay card information.   Informed the patient I will send a copy of the card via MyChart, and that he is welcome to call me back to get the copy card information over the phone.

## 2022-08-31 ENCOUNTER — Other Ambulatory Visit (HOSPITAL_COMMUNITY): Payer: Self-pay

## 2022-09-01 ENCOUNTER — Other Ambulatory Visit (HOSPITAL_COMMUNITY): Payer: Self-pay

## 2022-09-01 NOTE — Progress Notes (Signed)
Hospital follow-up and new patient visit: Derek Thompson was admitted to the internal medicine service from 4/18 until 08/20/2022 for management of acute on chronic heart failure (known nonischemic cardiomyopathy; EF had further worsened to 25%) in setting of medication nonadherence due to cost and frustration with medical conditions.  T2DM quite uncontrolled as well-he was resumed on metformin 500 twice daily, and Lantus 16 units was added-insulin is a new process for him, and is likely to further compound his frustration.  Derek Thompson is being referred to cardiology for outpatient monitoring of his severely reduced EF.

## 2022-09-02 NOTE — Telephone Encounter (Signed)
Spoke with pharmacy regarding patients copay on jardiance.   $146.69 for 30 day supply  Called in copay card:    Pharmacy was able to place both the Jardiance copay AND the [facility] card of file for patient to use.

## 2022-09-06 ENCOUNTER — Telehealth (HOSPITAL_COMMUNITY): Payer: Self-pay

## 2022-09-06 NOTE — Telephone Encounter (Signed)
Called to confirm Heart & Vascular Transitions of Care appointment at 09/07/22. Patient reminded to bring all medications and pill box organizer with them. Left message to confirm appointment.

## 2022-09-07 ENCOUNTER — Ambulatory Visit (HOSPITAL_COMMUNITY)
Admit: 2022-09-07 | Discharge: 2022-09-07 | Disposition: A | Discharge status: Home / Self Care | Payer: BC Managed Care – PPO | Source: Ambulatory Visit | Attending: Cardiology | Admitting: Cardiology

## 2022-09-07 ENCOUNTER — Encounter (HOSPITAL_COMMUNITY): Payer: Self-pay

## 2022-09-07 ENCOUNTER — Other Ambulatory Visit (HOSPITAL_COMMUNITY): Payer: Self-pay

## 2022-09-07 VITALS — BP 122/84 | HR 130 | Wt 188.2 lb

## 2022-09-07 DIAGNOSIS — Z8546 Personal history of malignant neoplasm of prostate: Secondary | ICD-10-CM | POA: Diagnosis not present

## 2022-09-07 DIAGNOSIS — E1165 Type 2 diabetes mellitus with hyperglycemia: Secondary | ICD-10-CM | POA: Insufficient documentation

## 2022-09-07 DIAGNOSIS — I110 Hypertensive heart disease with heart failure: Secondary | ICD-10-CM | POA: Diagnosis not present

## 2022-09-07 DIAGNOSIS — Z7982 Long term (current) use of aspirin: Secondary | ICD-10-CM | POA: Diagnosis not present

## 2022-09-07 DIAGNOSIS — Z7984 Long term (current) use of oral hypoglycemic drugs: Secondary | ICD-10-CM | POA: Insufficient documentation

## 2022-09-07 DIAGNOSIS — I6523 Occlusion and stenosis of bilateral carotid arteries: Secondary | ICD-10-CM | POA: Diagnosis not present

## 2022-09-07 DIAGNOSIS — E785 Hyperlipidemia, unspecified: Secondary | ICD-10-CM

## 2022-09-07 DIAGNOSIS — Z91141 Patient's other noncompliance with medication regimen due to financial hardship: Secondary | ICD-10-CM | POA: Diagnosis not present

## 2022-09-07 DIAGNOSIS — Z923 Personal history of irradiation: Secondary | ICD-10-CM | POA: Diagnosis not present

## 2022-09-07 DIAGNOSIS — I2510 Atherosclerotic heart disease of native coronary artery without angina pectoris: Secondary | ICD-10-CM | POA: Diagnosis not present

## 2022-09-07 DIAGNOSIS — Z79899 Other long term (current) drug therapy: Secondary | ICD-10-CM | POA: Insufficient documentation

## 2022-09-07 DIAGNOSIS — I447 Left bundle-branch block, unspecified: Secondary | ICD-10-CM | POA: Diagnosis not present

## 2022-09-07 DIAGNOSIS — Z794 Long term (current) use of insulin: Secondary | ICD-10-CM | POA: Insufficient documentation

## 2022-09-07 DIAGNOSIS — E118 Type 2 diabetes mellitus with unspecified complications: Secondary | ICD-10-CM | POA: Diagnosis not present

## 2022-09-07 DIAGNOSIS — I5042 Chronic combined systolic (congestive) and diastolic (congestive) heart failure: Secondary | ICD-10-CM | POA: Diagnosis not present

## 2022-09-07 DIAGNOSIS — R000 Tachycardia, unspecified: Secondary | ICD-10-CM | POA: Insufficient documentation

## 2022-09-07 LAB — COMPREHENSIVE METABOLIC PANEL
ALT: 10 U/L (ref 0–44)
AST: 11 U/L — ABNORMAL LOW (ref 15–41)
Albumin: 2.6 g/dL — ABNORMAL LOW (ref 3.5–5.0)
Alkaline Phosphatase: 37 U/L — ABNORMAL LOW (ref 38–126)
Anion gap: 7 (ref 5–15)
BUN: 9 mg/dL (ref 8–23)
CO2: 25 mmol/L (ref 22–32)
Calcium: 8.6 mg/dL — ABNORMAL LOW (ref 8.9–10.3)
Chloride: 105 mmol/L (ref 98–111)
Creatinine, Ser: 1.2 mg/dL (ref 0.61–1.24)
GFR, Estimated: >60 mL/min (ref >60)
Glucose, Bld: 207 mg/dL — ABNORMAL HIGH (ref 70–99)
Potassium: 4.2 mmol/L (ref 3.5–5.1)
Sodium: 137 mmol/L (ref 135–145)
Total Bilirubin: 0.3 mg/dL (ref 0.3–1.2)
Total Protein: 6.5 g/dL (ref 6.5–8.1)

## 2022-09-07 LAB — BRAIN NATRIURETIC PEPTIDE: B Natriuretic Peptide: 1891.4 pg/mL — ABNORMAL HIGH (ref 0.0–100.0)

## 2022-09-07 MED ORDER — FUROSEMIDE 20 MG PO TABS: 20.0000 mg | ORAL_TABLET | ORAL | 11 refills | Status: DC | PRN

## 2022-09-07 MED ORDER — SPIRONOLACTONE 25 MG PO TABS: 12.5000 mg | ORAL_TABLET | Freq: Every day | ORAL | 3 refills | Status: DC

## 2022-09-07 MED ORDER — ASPIRIN 81 MG PO TBEC: 81.0000 mg | DELAYED_RELEASE_TABLET | Freq: Every day | ORAL | 3 refills | Status: DC

## 2022-09-07 MED ORDER — DIGOXIN 125 MCG PO TABS: 0.1250 mg | ORAL_TABLET | Freq: Every day | ORAL | 3 refills | Status: DC

## 2022-09-07 NOTE — Progress Notes (Signed)
ReDS Vest / Clip - 09/07/22 1100       ReDS Vest / Clip   Station Marker C    Ruler Value 29    ReDS Value Range High volume overload    ReDS Actual Value 41

## 2022-09-07 NOTE — Progress Notes (Addendum)
[name]     Referring Physician: Dr. Ned Thompson Primary Care: Derek Plum, MD Primary Cardiologist: Derek Nose, MD   HPI: Referred to clinic by Dr. Ned Thompson for heart failure consultation.   62 y/o AAM w/ h/o chronic systolic heart failure, felt primarily due to NICM, CAD, Prostate Cancer treated w/ radiation in 2022, urinary incontinence, HTN, Type 2DM and h/o poor compliance, citing previous inability to afford meds.   Echo 1/22 EF 25-30%, RV normal. Subsequent cath showed single vessel obstructive CAD involving a ramus intermediate branch that was 80% stenosed, 35% ostial-mid LAD, 50% distal LAD, 60% prox-mid RCA, 1st diag, 50% ost-prox LCx 50% distal RCA, 30%. LV dysfunction was out of proportion to the degree of CAD. Medical management of CAD was recommended. RHC demonstrated normal filling pressures and preserved output (mRA 2, PAP 16/4, mPCW 5, CO 5.42, CI 2.79).  Repeat echo 10/22, EF improved to 45-50%, RV normal.   Also w/ carotid artery stenosis. Dopplers followed annually by Dr. Allyson Thompson. Most recent study 3/24 showed 40-59% Rt ICA stenosis, 1-30% Lt ICA stenosis.   Recently admitted 4/24 for a/c CHF. Reported being off all HF meds x 4 months, citing inability to afford refills. He was hypertensive on admission. Echo showed EF back down again, 25%, RV mildly reduced, mild MR. He was diuresed w/ IV lasix and restarted on GDMT. Labs were also c/w IDA. Tsat 12%, Ferritin 94, advised to start PO iron at d/c. Referred to [facility] clinic.   He presents today for f/u. Breathing has improved. No resting dyspnea. No longer w/ orthopnea and PND but c/w exertional dyspnea and fatigue, NYHA Class II-early III. ReDs 41%. Compliant w/ all medications except for insulin. Felt nauseated after taking the first 2 doses. Never started [place] as he reports it was too expensive. Has been able to get [facility] w/ copay Thompson. Able to afford all other meds.  Tolerating Entresto ok. No orthostatic symptoms. BP  122/84 bpm. EKG shows sinus tachycardia 126 bpm. LBBB, QRS 136 bpm.   Denies tobacco and ETOH use.   Diabetes poorly controlled, Hgb A1c 13.6   LDL 140 mg/dL   HIV NR  Cardiac Testing   Echo 1/22  EF 25-30%, RV normal   R/LHC 1/22  Prox RCA to Mid RCA lesion is 60% stenosed. Dist RCA lesion is 50% stenosed. Ost LAD to Mid LAD lesion is 35% stenosed. 1st Diag lesion is 30% stenosed. Dist LAD lesion is 50% stenosed. Ramus lesion is 80% stenosed. Ost Cx to Prox Cx lesion is 50% stenosed. LV end diastolic pressure is normal.   1. Single vessel obstructive CAD involving a ramus intermediate branch 2. Normal right heart pressures. PAP mean 11 mm Hg 3. Low LV filling pressures. PCWP 5 mm Hg.  4. Preserved cardiac output with index 2.79.    Plan: LV dysfunction is out of proportion to degree of CAD. Recommend  medical therapy. Optimize CHF therapy.   Echo 4/24 1. Left ventricular ejection fraction, by estimation, is 25%. The left  ventricle has severely decreased function. The left ventricle demonstrates  global hypokinesis. Left ventricular diastolic parameters are consistent  with Grade II diastolic  dysfunction (pseudonormalization).   2. Right ventricular systolic function is mildly reduced. The right  ventricular size is normal. Tricuspid regurgitation signal is inadequate  for assessing PA pressure.   3. Left atrial size was mildly dilated.   4. The mitral valve is normal in structure.  Mild mitral valve  regurgitation. No evidence of mitral stenosis.   5. The aortic valve is tricuspid. Aortic valve regurgitation is not  visualized. No aortic stenosis is present.   6. The inferior vena cava is normal in size with greater than 50%  respiratory variability, suggesting right atrial pressure of 3 mmHg.     Review of Systems: [y] = yes, [ ]  = no   General: Weight gain [ ] ; Weight loss [ ] ; Anorexia [ ] ; Fatigue [ Y];  Fever [ ] ; Chills [ ] ; Weakness [ ]   Cardiac: Chest pain/pressure [ ] ; Resting SOB [ ] ; Exertional SOB [ Y]; Orthopnea [ ] ; Pedal Edema [ ] ; Palpitations [ ] ; Syncope [ ] ; Presyncope [ ] ; Paroxysmal nocturnal dyspnea[ ]   Pulmonary: Cough [ ] ; Wheezing[ ] ; Hemoptysis[ ] ; Sputum [ ] ; Snoring [ ]   GI: Vomiting[ ] ; Dysphagia[ ] ; Melena[ ] ; Hematochezia [ ] ; Heartburn[ ] ; Abdominal pain [ ] ; Constipation [ ] ; Diarrhea [ ] ; BRBPR [ ]   GU: Hematuria[ ] ; Dysuria [ ] ; Nocturia[ ]   Vascular: Pain in legs with walking [ ] ; Pain in feet with lying flat [ ] ; Non-healing sores [ ] ; Stroke [ ] ; TIA [ ] ; Slurred speech [ ] ;  Neuro: Headaches[ ] ; Vertigo[ ] ; Seizures[ ] ; Paresthesias[ ] ;Blurred vision [ ] ; Diplopia [ ] ; Vision changes [ ]   Ortho/Skin: Arthritis [ ] ; Joint pain [ ] ; Muscle pain [ ] ; Joint swelling [ ] ; Back Pain [ ] ; Rash [ ]   Psych: Depression[ ] ; Anxiety[ ]   Heme: Bleeding problems [ ] ; Clotting disorders [ ] ; Anemia [ ]   Endocrine: Diabetes [ Y]; Thyroid dysfunction[ ]    Past Medical History:  Diagnosis Date   Anemia    Diabetes mellitus without complication (HCC)    Family history of breast cancer    Hypertension    Myocardial infarction [facility])    Prostate cancer (HCC)     Current Outpatient Medications  Medication Sig Dispense Refill   Accu-Chek Softclix Lancets lancets SMARTSIG:Topical 1-4 Times Daily 100 each 0   atorvastatin (LIPITOR) 40 MG tablet TAKE 1 TABLET (40 MG TOTAL) BY MOUTH DAILY. 30 tablet 0   blood glucose meter kit and supplies KIT Dispense based on patient and insurance preference. Use up to four times daily as directed. (FOR ICD-9 250.00, 250.01). 1 each 0   Continuous Glucose Receiver (DEXCOM G7 RECEIVER) DEVI Use as directed 1 each 0   Continuous Glucose Sensor (DEXCOM G7 SENSOR) MISC Replace every 14 days as instructed 1 each 3   digoxin (LANOXIN) 0.125 MG tablet Take 1 tablet (0.125 mg total) by mouth daily. 90 tablet 3   ferrous sulfate 325 (65 FE) MG tablet  Take 1 tablet (325 mg total) by mouth daily. 30 tablet 1   gabapentin (NEURONTIN) 300 MG capsule Take 1 capsule (300 mg total) by mouth 2 (two) times daily. 60 capsule 0   insulin glargine (LANTUS) 100 UNIT/ML Solostar Pen Inject 16 Units into the skin at bedtime. 15 mL 0   metFORMIN (GLUCOPHAGE) 500 MG tablet Take 1 tablet (500 mg total) by mouth 2 (two) times daily with a meal. 60 tablet 0   metoprolol succinate (TOPROL-XL) 25 MG 24 hr tablet TAKE 1 TABLET(25 MG) BY MOUTH DAILY 30 tablet 0   Multiple Vitamin (MULTIVITAMIN) tablet Take 1 tablet by mouth daily.     sacubitril-valsartan (ENTRESTO) 49-51 MG Take 1 tablet by mouth 2 (two) times daily. 60 tablet 0   spironolactone (ALDACTONE) 25 MG tablet  Take 0.5 tablets (12.5 mg total) by mouth daily. 45 tablet 3   tamsulosin (FLOMAX) 0.4 MG CAPS capsule Take 1 capsule (0.4 mg total) by mouth at bedtime. 30 capsule 0   No current facility-administered medications for this encounter.    No Known Allergies    Social History   Socioeconomic History   Marital status: Divorced    Spouse name: Not on file   Number of children: 3   Years of education: Not on file   Highest education level: Associate degree: academic program  Occupational History   Occupation: [employer]  Tobacco Use   Smoking status: Former    Packs/day: 1.00    Years: 43.00    Additional pack years: 0.00    Total pack years: 43.00    Types: Cigarettes    Quit date: 05/22/2017    Years since quitting: 5.2   Smokeless tobacco: Never  Vaping Use   Vaping Use: Never used  Substance and Sexual Activity   Alcohol use: Not Currently    Comment: last drink 2015   Drug use: Not Currently    Comment: none in 7 years   Sexual activity: Not Currently  Other Topics Concern   Not on file  Social History Narrative   3 sons   Social Determinants of Health   Financial Resource Strain: Low Risk  (08/19/2022)   Overall Financial Resource Strain (CARDIA)    Difficulty of Paying  Living Expenses: Not very hard  Food Insecurity: No Food Insecurity (08/19/2022)   Hunger Vital Sign    Worried About Running Out of Food in the Last Year: Never true    Ran Out of Food in the Last Year: Never true  Transportation Needs: No Transportation Needs (08/19/2022)   PRAPARE - Administrator, Civil Service (Medical): No    Lack of Transportation (Non-Medical): No  Physical Activity: Not on file  Stress: Not on file  Social Connections: Not on file  Intimate Partner Violence: Not on file      Family History  Problem Relation Age of Onset   Breast cancer Mother        dx 30s/40s, twice   Cancer Maternal Grandfather        unknown type, d. >50    Vitals:   09/07/22 1141  BP: 122/84  Pulse: (!) 130  SpO2: 96%  Weight: 85.4 kg (188 lb 3.2 oz)    PHYSICAL EXAM: ReDs 41%  General:  mildly fatigued appearing, pale. No respiratory difficulty HEENT: normal Neck: supple. JVD 7 cm. Carotids 2+ bilat; no bruits. No lymphadenopathy or thryomegaly appreciated. Cor: PMI nondisplaced. Regular rhythm and tachy rate. No rubs, gallops or murmurs. Lungs: decreased BS at the bases  Abdomen: obese, soft, nontender, nondistended. No hepatosplenomegaly. No bruits or masses. Good bowel sounds. Extremities: no cyanosis, clubbing, rash, trace-1+ b/l LE edema Neuro: alert & oriented x 3, cranial nerves grossly intact. moves all 4 extremities w/o difficulty. Affect pleasant.  ECG: Sinus tach w/ LBBB, 126 bpm, QRS 136 ms    ASSESSMENT & PLAN:  1. Chronic Systolic Heart Failure - Diagnosed 2022. Echo 1/22 EF 25-30%, RV normal. Subsequent cath showed single vessel obstructive CAD involving a ramus intermediate branch that was 80% stenosed, 35% ostial-mid LAD, 50% distal LAD, 60% prox-mid RCA, 1st diag, 50% ost-prox LCx 50% distal RCA, 30%. LV dysfunction was out of proportion to the degree of CAD. RHC demonstrated normal filling pressures and preserved output (mRA 2, PAP 16/4, mPCW  5, CO 5.42, CI 2.79). - Repeat Echo 10/22 EF improved to 45-50%, RV normal. - Echo 4/24 EF 25%, RV mildly reduced, mild MR (also off meds x 4 months)  - NYHA Class II-early III. Mild volume overload on exam. ReDs 41%. EKG ST w/ LBBB  - Add Spiro 12.5 mg daily  - Add digoxin 0.125 mg daily  - Continue Entresto 49-51 mg bid  - Holding SGLT2i initiation given poorly controlled DM, Hgb A1c 13.6 - Lasix 20 mg PRN  - Continue Toprol XL 25 mg daily  - Check BMP and BNP today  - concern for CAD progression. Plan repeat [facility]  - refer to [facility]. Plan GDMT titration q2 wks until fully optimized. Plan repeat echo ~3 months. Would benefit from CRT device w/ LBBB/dyssynchrony   2. CAD - mod CAD on cath in 2022 - denies CP, however w/ worsening HF, poorly controlled DM and HLD, there is concern for disease progression  - recommend repeat R/LHC. Explained indication to patient and he agrees to cath  - continue statin + ? blocker - start ASA 81 mg daily   3. LBBB - suspect contributory to HF - will need to be evaluated for CRT-D but needs improved glycemic control first   4. Type 2DM  - poorly controlled, Hgb A1c 13.6 - needs to improve compliance w/ insulin regimen - discussed importance glycemic control. Needs f/u w/ PCP   5. HLD, LDL Goal < 55  - recent lipid panel  LDL elevated at 140 mg/dL (had been off statin) - recently restarted on Lipitor 40 mg  - needs f/u FLP and HFT in 6 wks  6. IDA - Tsat 12%, Ferritin 94 - refer for IV iron   7. Prostate Cancer - completed radiation in 2022 but lost to f/u - has been referred back to radiation oncology and urology   8. Carotid Artery Stenosis  - dopplers followed annually by Dr. Allyson Thompson  - last dopplers 3/24: 40-59% Rt ICA stenosis, 1-30% Lt ICA stenosis - asa + statin   Referred to HFSW (PCP, Medications, Transportation, ETOH Abuse, Drug Abuse, Insurance, Financial ): No Refer to Pharmacy: Yes  Refer to Home Health: No Refer to  Advanced Heart Failure Clinic: Yes (assign to Dr. Gala Romney)  Refer to General Cardiology: No   Refer to the [facility]. Follow up in 2 wks w/ pharmD for med titration. Will need titration q2 wks until fully optimized. Plan [facility] w/ Dr. Gala Romney.   Robbie Lis, PA  Patient seen and examined with the above-signed Advanced Practice Provider and/or Housestaff. I personally reviewed laboratory data, imaging studies and relevant notes. I independently examined the patient and formulated the important aspects of the plan. I have edited the note to reflect any of my changes or salient points. I have personally discussed the plan with the patient and/or family.  62 y/o male with HTN, poorly-controlled DM2 (hgba1c 13), LBBB and CAD. Referred for further evaluation of acute systolic HF EF 20-25%   Has poor health literacy. Reports NYHA II-III symptoms. Volume status elevated.   General:  Sitting in chair. No resp difficulty HEENT: normal Neck: supple. JVP 8-9. Carotids 2+ bilat; no bruits. No lymphadenopathy or thryomegaly appreciated. Cor: PMI laterally displaced. Regular tachy +s3 Lungs: clear Abdomen: soft, nontender, nondistended. No hepatosplenomegaly. No bruits or masses. Good bowel sounds. Extremities: no cyanosis, clubbing, rash, 1+ edema Neuro: alert & orientedx3, cranial nerves grossly intact. moves all 4 extremities w/o difficulty. Affect pleasant  He  is quite tenuous. I am concerned for progressive CAD and low output HF. LBBB cardiomyopathy may also be contributing. Adjust meds as above. Proceed with R/L cath in near future. Suspect he will need CRT and cMRI.   Arvilla Meres, MD  12:27 AM

## 2022-09-07 NOTE — Patient Instructions (Addendum)
EKG done today.  RedsClip done today.  Labs done today. We will contact you only if your labs are abnormal.  START Digoxin 0.125mg  (1 tablet) by mouth daily.   START Spironolactone 12.5mg  (1/2 tablet) by mouth daily.   START Lasix 20mg  (1 tablet) by mouth daily as needed for swelling or a weight gain of 3 pounds or more in 24 hours.   START Aspirin 81mg  (1 tablet) by mouth daily.   No other medication changes were made. Please continue all current medications as prescribed.  We will contact you at a later date to schedule your iron infusion.   Your physician recommends that you schedule a follow-up appointment in: 2 weeks with our pharmacy clinic.   If you have any questions or concerns before your next appointment please send [place] a message through [place] or call our office at [phone].    TO LEAVE A MESSAGE FOR THE NURSE SELECT OPTION 2, PLEASE LEAVE A MESSAGE INCLUDING: YOUR NAME DATE OF BIRTH CALL BACK NUMBER REASON FOR CALL**this is important as we prioritize the call backs  YOU WILL RECEIVE A CALL BACK THE SAME DAY AS LONG AS YOU CALL BEFORE 4:00 PM   Do the following things EVERYDAY: Weigh yourself in the morning before breakfast. Write it down and keep it in a log. Take your medicines as prescribed Eat low salt foods--Limit salt (sodium) to 2000 mg per day.  Stay as active as you can everyday Limit all fluids for the day to less than 2 liters   At the [facility], you and your health needs are our priority. As part of our continuing mission to provide you with exceptional heart care, we have created designated Provider Care Teams. These Care Teams include your primary Cardiologist (physician) and Advanced Practice Providers (APPs- Physician Assistants and Nurse Practitioners) who all work together to provide you with the care you need, when you need it.   You may see any of the following providers on your designated Care Team at your next follow  up: Dr Arvilla Meres Dr Marca Ancona Dr. Marcos Eke, NP Robbie Lis, [facility] [place], [place] Brynda Peon, NP Karle Plumber, PharmD   Please be sure to bring in all your medications bottles to every appointment.    Thank you for choosing [facility]     You are scheduled for a Cardiac Catheterization on Friday, May 24 with Dr. Arvilla Meres.  1. Please arrive at the [facility] (Main Entrance A) at [facility]: [address] [place], [name] [address] at 5:30 AM (This time is 2 hour(s) before your procedure to ensure your preparation). Free valet parking service is available. You will check in at ADMITTING. The support person will be asked to wait in the waiting room.  It is OK to have someone drop you off and come back when you are ready to be discharged.    Special note: Every effort is made to have your procedure done on time. Please understand that emergencies sometimes delay scheduled procedures.  2. Diet: Do not eat solid foods after midnight.  The patient may have clear liquids until 5am upon the day of the procedure.   3. Medication instructions in preparation for your procedure:  DO NOT take Lasix the day of your procedure.   Take only 8 units of insulin the night before your procedure. Do not take any insulin on the day of the procedure.  Do not take Diabetes Med Glucophage (Metformin) on the day of the procedure and HOLD 48 HOURS AFTER THE PROCEDURE.  On the morning of your procedure, take your Aspirin and any morning medicines NOT listed above.  You may use sips of water.  4. Plan to go home the same day, you will only stay overnight if medically necessary. 5. Bring a current list of your medications and current insurance cards. 6. You MUST have a responsible person to drive you home. 7. Someone MUST be with you the first 24 hours after you arrive home or your discharge will  be delayed. 8. Please wear clothes that are easy to get on and off and wear slip-on shoes.  Thank you for allowing [place] to care for you!   -- [place] [facility]

## 2022-09-11 ENCOUNTER — Other Ambulatory Visit: Payer: Self-pay | Admitting: Internal Medicine

## 2022-09-15 ENCOUNTER — Encounter (HOSPITAL_COMMUNITY): Payer: Self-pay | Admitting: Cardiology

## 2022-09-18 ENCOUNTER — Other Ambulatory Visit: Payer: Self-pay

## 2022-09-20 ENCOUNTER — Encounter (HOSPITAL_COMMUNITY): Payer: Self-pay | Admitting: *Deleted

## 2022-09-20 ENCOUNTER — Ambulatory Visit (HOSPITAL_COMMUNITY)
Admission: RE | Admit: 2022-09-20 | Discharge: 2022-09-20 | Disposition: A | Discharge status: Home / Self Care | Payer: BC Managed Care – PPO | Source: Ambulatory Visit | Attending: Cardiology | Admitting: Cardiology

## 2022-09-20 VITALS — BP 118/70 | HR 79 | Wt 178.2 lb

## 2022-09-20 DIAGNOSIS — I5042 Chronic combined systolic (congestive) and diastolic (congestive) heart failure: Secondary | ICD-10-CM | POA: Diagnosis present

## 2022-09-20 DIAGNOSIS — I5022 Chronic systolic (congestive) heart failure: Secondary | ICD-10-CM | POA: Diagnosis not present

## 2022-09-20 LAB — CBC
HCT: 34.2 % — ABNORMAL LOW (ref 39.0–52.0)
Hemoglobin: 11 g/dL — ABNORMAL LOW (ref 13.0–17.0)
MCH: 28.3 pg (ref 26.0–34.0)
MCHC: 32.2 g/dL (ref 30.0–36.0)
MCV: 87.9 fL (ref 80.0–100.0)
Platelets: 331 10*3/uL (ref 150–400)
RBC: 3.89 MIL/uL — ABNORMAL LOW (ref 4.22–5.81)
RDW: 13.8 % (ref 11.5–15.5)
WBC: 5.4 10*3/uL (ref 4.0–10.5)
nRBC: 0 % (ref 0.0–0.2)

## 2022-09-20 LAB — BRAIN NATRIURETIC PEPTIDE: B Natriuretic Peptide: 1809.6 pg/mL — ABNORMAL HIGH (ref 0.0–100.0)

## 2022-09-20 LAB — BASIC METABOLIC PANEL
Anion gap: 10 (ref 5–15)
BUN: 16 mg/dL (ref 8–23)
CO2: 25 mmol/L (ref 22–32)
Calcium: 9 mg/dL (ref 8.9–10.3)
Chloride: 105 mmol/L (ref 98–111)
Creatinine, Ser: 1.13 mg/dL (ref 0.61–1.24)
GFR, Estimated: >60 mL/min (ref >60)
Glucose, Bld: 206 mg/dL — ABNORMAL HIGH (ref 70–99)
Potassium: 4.6 mmol/L (ref 3.5–5.1)
Sodium: 140 mmol/L (ref 135–145)

## 2022-09-20 LAB — DIGOXIN LEVEL: Digoxin Level: 0.6 ng/mL — ABNORMAL LOW (ref 0.8–2.0)

## 2022-09-20 MED ORDER — SACUBITRIL-VALSARTAN 97-103 MG PO TABS: 1.0000 | ORAL_TABLET | Freq: Two times a day (BID) | ORAL | 3 refills | Status: DC

## 2022-09-20 NOTE — Patient Instructions (Signed)
It was a pleasure seeing you today!  MEDICATIONS: -We are changing your medications today --Increase Entresto to 97/103 mg (1 tablet) twice daily. You may take 2 tablets of the 49/51 mg strength twice daily until you receive the new strength.  -Take Lasix 20 mg (1 tablet) for 2 days, then resume taking as needed only. -Call if you have questions about your medications.  LABS: -We will call you if your labs need attention.  NEXT APPOINTMENT: Return to clinic in 2 weeks with Pharmacy Clinic.  In general, to take care of your heart failure: -Limit your fluid intake to 2 Liters (half-gallon) per day.   -Limit your salt intake to ideally 2-3 grams (2000-3000 mg) per day. -Weigh yourself daily and record, and bring that "weight diary" to your next appointment.  (Weight gain of 2-3 pounds in 1 day typically means fluid weight.) -The medications for your heart are to help your heart and help you live longer.   -Please contact us before stopping any of your heart medications.  Call the clinic at [phone] with questions or to reschedule future appointments.   You are scheduled for a Cardiac Catheterization on Friday, May 24 with Dr. Arvilla Meres.   1. Please arrive at the [facility] (Main Entrance A) at [facility]: [address] [place], [name] [address] at 5:30 AM (This time is 2 hour(s) before your procedure to ensure your preparation). Free valet parking service is available. You will check in at ADMITTING. The support person will be asked to wait in the waiting room.  It is OK to have someone drop you off and come back when you are ready to be discharged.     Special note: Every effort is made to have your procedure done on time. Please understand that emergencies sometimes delay scheduled procedures.   2. Diet: Do not eat solid foods after midnight.  The patient may have clear liquids until 5am upon the day of the procedure.     3. Medication instructions in preparation  for your procedure:   DO NOT take Lasix the day of your procedure.    Take only 8 units of insulin the night before your procedure. Do not take any insulin on the day of the procedure.   Do not take Diabetes Med Glucophage (Metformin) on the day of the procedure and HOLD 48 HOURS AFTER THE PROCEDURE.   On the morning of your procedure, take your Aspirin and any morning medicines NOT listed above.  You may use sips of water.   4. Plan to go home the same day, you will only stay overnight if medically necessary. 5. Bring a current list of your medications and current insurance cards. 6. You MUST have a responsible person to drive you home. 7. Someone MUST be with you the first 24 hours after you arrive home or your discharge will be delayed. 8. Please wear clothes that are easy to get on and off and wear slip-on shoes.   Thank you for allowing [place] to care for you!   -- [place] [facility]

## 2022-09-20 NOTE — Progress Notes (Cosign Needed Addendum)
Advanced Heart Failure Clinic Note    Referring Physician: Dr. Ned Card Primary Care: [name], MD Primary Cardiologist: [name], MD HF Cardiologist: Dr. Gala Romney   HPI:  62 y/o AAM w/ h/o chronic systolic heart failure, felt primarily due to NICM, CAD, Prostate Cancer treated w/ radiation in 2022, urinary incontinence, HTN, Type 2DM and h/o poor compliance, citing previous inability to afford meds.    Echo 05/2020 EF 25-30%, RV normal. Subsequent cath showed single vessel obstructive CAD involving a ramus intermediate branch that was 80% stenosed, 35% ostial-mid LAD, 50% distal LAD, 60% prox-mid RCA, 1st diag, 50% ost-prox LCx 50% distal RCA, 30%. LV dysfunction was out of proportion to the degree of CAD. Medical management of CAD was recommended. RHC demonstrated normal filling pressures and preserved output (mRA 2, PAP 16/4, mPCW 5, CO 5.42, CI 2.79).  Repeat echo 01/2021, EF improved to 45-50%, RV normal.    Also w/ carotid artery stenosis. Dopplers followed annually by Dr. Allyson Sabal. Most recent study 07/2022 showed 40-59% Rt ICA stenosis, 1-30% Lt ICA stenosis.    Recently admitted 08/2022 for a/c CHF. Reported being off all HF meds x 4 months, citing inability to afford refills. He was hypertensive on admission. Echo showed EF back down again, 25%, RV mildly reduced, mild MR. He was diuresed w/ IV Lasix and restarted on GDMT. Labs were also c/w IDA. Tsat 12%, Ferritin 94, advised to start PO iron at d/c. Referred to [facility] clinic.    On 09/07/2022 he presented for f/u. Breathing had improved. No resting dyspnea. No longer w/ orthopnea and PND but c/w exertional dyspnea and fatigue, NYHA Class II-early III. ReDs 41%. Compliant w/ all medications except for insulin. Felt nauseated after taking the first 2 doses. Never started [place] as he reports it was too expensive. Had been able to get [facility] w/ copay card. Was able to afford all other meds. Was tolerating Entresto ok. No  orthostatic symptoms. BP was 122/84 bpm. EKG showed sinus tachycardia 126 bpm. LBBB, QRS 136 bpm.   Today he returns to HF clinic for pharmacist medication titration. At last visit with MD, spironolactone 12.5 mg daily, digoxin 0.125 mg daily, and Lasix 20 mg PRN were started. Labs on 09/07/2022 showed increased BNP and Lasix was switched to 20 mg daily, however office was unable to reach patient and change Lasix instructions. Overall feeling better since recent medication changes. His sleep has improved. Before medication changes he was sleeping for a few hours before waking up SOB and now he is sleeping for 5-6 hours. Wants to walk around more since his symptoms have improved. No dizziness, lightheadedness, CP, or palpitations reported. Patient reports daily fatigue which is limiting his ability to work. Patient states his breathing has been pretty good and has improved since medication changes. He reports he gets winded on long walks, where he has to stop to catch his breath. This is worse when walking quickly. At work, he notices he has to stop less often on long walks. Patient does not check his weight at home but wants to start checking his weight at work. He has not taken any PRN Lasix. On exam today patient has 1+ bilateral LEE. Patient reported having PND in past however it has improved.  He uses wedge pillow for incline to sleep and states he can sleep flat if needed but has not in awhile. His appetite is normal, he eats breakfast and lunch at work then some fruit at home for  dinner. Patient states he is trying to avoid processed foods.   HF Medications: - metoprolol 25 mg daily  - Entresto 49/51 mg BID  - spironolactone 12.5 mg daily  - digoxin 0.125 mg daily  - Lasix 20 mg PRN  Has the patient been experiencing any side effects to the medications prescribed?  No  Does the patient have any problems obtaining medications due to transportation or finances?   No, Blue Cross, [employer], Using [employer] of regimen: good Understanding of indications: good Potential of compliance: fair Patient understands to avoid NSAIDs. Patient understands to avoid decongestants.    Pertinent Lab Values: Serum creatinine 1.2, BUN 9, Potassium 4.2, Sodium 137, BNP 1891.4,   Vital Signs: Weight: 178.2 lbs (last clinic weight: 188.2 lbs) Blood pressure: 118/70 mmHg  Heart rate: 79 bpm   Assessment/Plan:  1. Chronic Systolic Heart Failure - Diagnosed 2022. Echo 05/2020 EF 25-30%, RV normal. Subsequent cath showed single vessel obstructive CAD involving a ramus intermediate branch that was 80% stenosed, 35% ostial-mid LAD, 50% distal LAD, 60% prox-mid RCA, 1st diag, 50% ost-prox LCx 50% distal RCA, 30%. LV dysfunction was out of proportion to the degree of CAD. RHC demonstrated normal filling pressures and preserved output (mRA 2, PAP 16/4, mPCW 5, CO 5.42, CI 2.79). - Repeat Echo 01/2021 EF improved to 45-50%, RV normal. - Echo 08/2022 EF 25%, RV mildly reduced, mild MR (also off meds x 4 months)  - NYHA Class II-early III. Noted 1+ bilateral LEE on exam today. However, weight is down 10 lbs from last visit and SOB and PND/orthopnea have improved.  - BMET, CBC, BNP and digoxin level pending. Plan for RHC/LHC on Friday.  - Take Lasix 20 mg x 2 days then resume 20 mg PRN only. - Continue metoprolol 25 mg daily  - Increase Entresto 97/103 mg BID. Labs today and in 2 weeks.   - Continue spironolactone 12.5 mg daily  - Continue digoxin 0.125 mg daily  - Holding SGLT2i initiation given poorly controlled DM, Hgb A1c 13.6  - Concern for CAD progression. Plan repeat [facility] Friday.  - Repeat echo ~3 months. Would benefit from CRT device w/ LBBB/dyssynchrony    2. CAD - mod CAD on cath in 2022 - denies CP, however w/ worsening HF, poorly controlled DM and HLD, there is concern for disease progression  - recommend repeat R/LHC. Scheduled for Friday.  - continue  statin + ? blocker - continue ASA 81 mg daily    3. LBBB - suspect contributory to HF - will need to be evaluated for CRT-D but needs improved glycemic control first    4. Type 2DM  - poorly controlled, Hgb A1c 13.6 - needs to improve compliance w/ insulin regimen - discussed importance glycemic control. Needs f/u w/ PCP  - continue metformin 500 mg BID   5. HLD, LDL Goal < 55  - recent lipid panel  LDL elevated at 140 mg/dL (had been off statin) - continue Lipitor 40 mg daily   6. IDA - Tsat 12%, Ferritin 94 - refer for IV iron    7. Prostate Cancer - completed radiation in 2022 but lost to f/u - has been referred back to radiation oncology and urology    8. Carotid Artery Stenosis  - dopplers followed annually by Dr. Allyson Sabal  - last dopplers 07/2022: 40-59% Rt ICA stenosis, 1-30% Lt ICA stenosis - continue asa + statin   Follow up on 10/05/2022 with  pharmacy clinic to titrate medications and repeat labs. Letter providing work restriction information was provided to patient today.    Karle Plumber, PharmD, BCPS, BCCP, CPP Heart Failure Clinic Pharmacist [phone]

## 2022-09-22 ENCOUNTER — Telehealth (HOSPITAL_COMMUNITY): Payer: Self-pay | Admitting: Cardiology

## 2022-09-22 NOTE — Telephone Encounter (Signed)
Patient called to cancel cath on 09/23/22 Reports he does not have transportation  -offered assistance with transportation pre/post procedure  Pt declined would like to cancel at this time and will set up at a later date   Message to provider as FYI First case 5/24 @ 730

## 2022-09-23 ENCOUNTER — Ambulatory Visit (HOSPITAL_COMMUNITY)
Admission: RE | Admit: 2022-09-23 | Payer: BC Managed Care – PPO | Source: Home / Self Care | Admitting: Internal Medicine

## 2022-09-23 ENCOUNTER — Encounter (HOSPITAL_COMMUNITY): Admission: RE | Payer: Self-pay | Source: Home / Self Care

## 2022-09-23 SURGERY — RIGHT/LEFT HEART CATH AND CORONARY ANGIOGRAPHY
Anesthesia: LOCAL

## 2022-09-23 NOTE — Progress Notes (Signed)
Advanced Heart Failure Clinic Note    Referring Physician: Dr. Ned Card Primary Care: [name], MD Primary Cardiologist: [name], MD HF Cardiologist: Dr. Gala Romney   HPI:  62 y/o AAM w/ h/o chronic systolic heart failure, felt primarily due to NICM, CAD, Prostate Cancer treated w/ radiation in 2022, urinary incontinence, HTN, Type 2DM and h/o poor compliance, citing previous inability to afford meds.    Echo 05/2020 EF 25-30%, RV normal. Subsequent cath showed single vessel obstructive CAD involving a ramus intermediate branch that was 80% stenosed, 35% ostial-mid LAD, 50% distal LAD, 60% prox-mid RCA, 1st diag, 50% ost-prox LCx 50% distal RCA, 30%. LV dysfunction was out of proportion to the degree of CAD. Medical management of CAD was recommended. RHC demonstrated normal filling pressures and preserved output (mRA 2, PAP 16/4, mPCW 5, CO 5.42, CI 2.79).  Repeat echo 01/2021, EF improved to 45-50%, RV normal.    Also w/ carotid artery stenosis. Dopplers followed annually by Dr. Allyson Sabal. Most recent study 07/2022 showed 40-59% Rt ICA stenosis, 1-30% Lt ICA stenosis.    Recently admitted 08/2022 for a/c CHF. Reported being off all HF meds x 4 months, citing inability to afford refills. He was hypertensive on admission. Echo showed EF back down again, 25%, RV mildly reduced, mild MR. He was diuresed w/ IV Lasix and restarted on GDMT. Labs were also c/w IDA. Tsat 12%, Ferritin 94, advised to start PO iron at d/c. Referred to [facility] clinic.    On 09/07/2022 he presented for f/u. Breathing had improved. No resting dyspnea. No longer w/ orthopnea and PND but c/w exertional dyspnea and fatigue, NYHA Class II-early III. ReDs 41%. Compliant w/ all medications except for insulin. Felt nauseated after taking the first 2 doses. Never started [place] as he reports it was too expensive. Had been able to get [facility] w/ copay card. Was able to afford all other meds. Was tolerating Entresto ok. No  orthostatic symptoms. BP was 122/84 bpm. EKG showed sinus tachycardia 126 bpm. LBBB, QRS 136 bpm.   Presented to [facility] for pharmacist medication titration on 09/20/22. Was overall feeling better since recent medication changes. Reported his sleep had improved. Before medication changes he was sleeping for a few hours before waking up SOB and now he is sleeping for 5-6 hours. No dizziness, lightheadedness, CP, or palpitations reported. Patient reported daily fatigue which was limiting his ability to work. He reported he gets winded on long walks, where he has to stop to catch his breath. This was notably worse when walking quickly. At work, he notices he has to stop less often on long walks. Patient reported that he does not check his weight at home but wanted to start checking his weight at work. He had not taken any PRN Lasix. On exam in clinic, patient had 1+ bilateral LEE. Patient reported having PND in past; however, it had improved.  He reported he uses wedge pillow for incline to sleep and states he can sleep flat if needed but has not in awhile. His appetite was normal, he eats breakfast and lunch at work then some fruit at home for dinner. Patient stated he is trying to avoid processed foods.   Today he returns to HF clinic for pharmacist medication titration. At recent visits to clinic, spironolactone 12.5 mg daily, digoxin 0.125 mg daily, and Lasix 20 mg PRN were started. Additionally, Entresto was increased to 97/103 mg BID at previous visit. He canceled his RHC/LHC on 09/23/22 as he did not  have any transportation (noted friends/family not in town for holiday weekend). Overall he is feeling well today. Says he has been feeling a lot better since last visit. Has more energy and work is not as hard. No dizziness or lightheadedness. No CP or palpitations. Only get SOB when walking too quickly. Weight has been stable at ~177-178 lbs, has been checking on scale at work. Has been taking Lasix 20 mg daily  instead of PRN because it was helping get rid of his leg swelling. On exam, still has trace bilateral LEE but this is improved from when seen in pharmacy clinic 2 weeks ago. No PND or orthopnea. States he has been sleeping better and so he feels more rested. Taking and tolerating all medications. BP in clinic 130/72. BP at home has also been 125-130/70s. He does not have a PCP and has not been taking insulin because it made him nauseous.    HF Medications: - metoprolol 25 mg daily  - Entresto 97/103 mg BID  - spironolactone 12.5 mg daily  - digoxin 0.125 mg daily  - Lasix 20 mg PRN  Has the patient been experiencing any side effects to the medications prescribed?  No  Does the patient have any problems obtaining medications due to transportation or finances?   No, Blue Cross, [employer], Using [employer] of regimen: good Understanding of indications: good Potential of compliance: fair Patient understands to avoid NSAIDs. Patient understands to avoid decongestants.    Pertinent Lab Values: 09/20/22: Serum creatinine 1.13, BUN 16, Potassium 4.6, Sodium 140, BNP 1809.6,  BMET today pending  Vital Signs: Weight: 177.2 lbs (last clinic weight: 178.2 lbs) Blood pressure: 130/72 mmHg  Heart rate: 93 bpm   Assessment/Plan:  1. Chronic Systolic Heart Failure - Diagnosed 2022. Echo 05/2020 EF 25-30%, RV normal. Subsequent cath showed single vessel obstructive CAD involving a ramus intermediate branch that was 80% stenosed, 35% ostial-mid LAD, 50% distal LAD, 60% prox-mid RCA, 1st diag, 50% ost-prox LCx 50% distal RCA, 30%. LV dysfunction was out of proportion to the degree of CAD. RHC demonstrated normal filling pressures and preserved output (mRA 2, PAP 16/4, mPCW 5, CO 5.42, CI 2.79). - Repeat Echo 01/2021 EF improved to 45-50%, RV normal. - Echo 08/2022 EF 25%, RV mildly reduced, mild MR (also off meds x 4 months)  - NYHA Class II-early III. Volume status  improved. Weight down 1 lb and LEE improved. Does not appear volume overloaded.  - Continue Lasix 20 mg daily. Will update prescription to reflect how he is currently taking medication.  - Continue metoprolol 25 mg daily  - Continue Entresto 97/103 mg BID.  - Increase spironolactone to 25 mg daily. Repeat BMET in 10-14 days.   - Continue digoxin 0.125 mg daily. Needs digoxin level next visit.   - Holding SGLT2i initiation given poorly controlled DM, Hgb A1c 13.6  - Concern for CAD progression.  - Repeat echo ~3 months. Would benefit from CRT device w/ LBBB/dyssynchrony    2. CAD - mod CAD on cath in 2022 - denies CP, however w/ worsening HF, poorly controlled DM and HLD, there is concern for disease progression  - Plan repeat R/LHC. Rescheduled for 10/14/22.  - continue statin + ? blocker - continue ASA 81 mg daily    3. LBBB - suspect contributory to HF - will need to be evaluated for CRT-D but needs improved glycemic control first    4. Type 2DM  - poorly controlled,  Hgb A1c 13.6 - discussed importance of glycemic control.  - I strongly encouraged him to find a new PCP who can manage his diabetes. Provided packet in clinic with local PCP offices to choose from. Controlling his DM is paramount to his care.  - He is not taking insulin as it made him nauseous.  - Increase metformin to 1000 mg BID. This office will not continue to manage his DM, will need to establish care with PCP for further refills. Suspect more oral antidiabetic agents and/or insulin will be required.    5. HLD, LDL Goal < 55  - recent lipid panel  LDL elevated at 140 mg/dL (had been off statin) - continue Lipitor 40 mg daily - Will need repeat lipids at follow up.     6. IDA - Tsat 12%, Ferritin 94   7. Prostate Cancer - completed radiation in 2022 but lost to f/u - has been referred back to radiation oncology and urology    8. Carotid Artery Stenosis  - dopplers followed annually by Dr. Allyson Sabal  - last  dopplers 07/2022: 40-59% Rt ICA stenosis, 1-30% Lt ICA stenosis - continue asa + statin   Follow up 2 weeks with Dr. Gala Romney.    Karle Plumber, PharmD, BCPS, BCCP, CPP Heart Failure Clinic Pharmacist [phone]

## 2022-09-29 ENCOUNTER — Other Ambulatory Visit: Payer: Self-pay | Admitting: Internal Medicine

## 2022-10-04 ENCOUNTER — Ambulatory Visit (INDEPENDENT_AMBULATORY_CARE_PROVIDER_SITE_OTHER): Payer: BC Managed Care – PPO | Admitting: Licensed Clinical Social Worker

## 2022-10-04 DIAGNOSIS — R4589 Other symptoms and signs involving emotional state: Secondary | ICD-10-CM

## 2022-10-04 NOTE — BH Specialist Note (Signed)
Patient no-showed today's appointment; appointment was for Telephone visit at 2:30 Pm  Behavioral Health Clinician [facility] from here on out)  attempted patient twice via Telephone number [phone]    [facility] contacted patient from telephone number [phone]. [facility] left a  VM.   Patient will need to reschedule appointment by calling Internal medicine center [phone].  Christen Butter, MSW, LCSW-A She/Her Behavioral Health Clinician [facility]  Internal Medicine Center Direct Dial:[phone]  Fax [phone] Main Office Phone: [phone] [address] [place] [place], [name] [address] Website: [facility] Internal Medicine [facility]  [place], [name]  [place]

## 2022-10-05 ENCOUNTER — Ambulatory Visit (HOSPITAL_COMMUNITY)
Admission: RE | Admit: 2022-10-05 | Discharge: 2022-10-05 | Disposition: A | Discharge status: Home / Self Care | Payer: BC Managed Care – PPO | Source: Ambulatory Visit | Attending: Cardiology | Admitting: Cardiology

## 2022-10-05 VITALS — BP 130/72 | HR 93 | Wt 177.2 lb

## 2022-10-05 DIAGNOSIS — I5022 Chronic systolic (congestive) heart failure: Secondary | ICD-10-CM | POA: Insufficient documentation

## 2022-10-05 DIAGNOSIS — I447 Left bundle-branch block, unspecified: Secondary | ICD-10-CM | POA: Insufficient documentation

## 2022-10-05 DIAGNOSIS — E785 Hyperlipidemia, unspecified: Secondary | ICD-10-CM | POA: Insufficient documentation

## 2022-10-05 DIAGNOSIS — I2510 Atherosclerotic heart disease of native coronary artery without angina pectoris: Secondary | ICD-10-CM | POA: Insufficient documentation

## 2022-10-05 DIAGNOSIS — Z7982 Long term (current) use of aspirin: Secondary | ICD-10-CM | POA: Diagnosis not present

## 2022-10-05 DIAGNOSIS — I428 Other cardiomyopathies: Secondary | ICD-10-CM | POA: Diagnosis not present

## 2022-10-05 DIAGNOSIS — Z8546 Personal history of malignant neoplasm of prostate: Secondary | ICD-10-CM | POA: Insufficient documentation

## 2022-10-05 DIAGNOSIS — E1165 Type 2 diabetes mellitus with hyperglycemia: Secondary | ICD-10-CM | POA: Insufficient documentation

## 2022-10-05 LAB — BASIC METABOLIC PANEL
Anion gap: 8 (ref 5–15)
BUN: 17 mg/dL (ref 8–23)
CO2: 25 mmol/L (ref 22–32)
Calcium: 9.2 mg/dL (ref 8.9–10.3)
Chloride: 103 mmol/L (ref 98–111)
Creatinine, Ser: 1.19 mg/dL (ref 0.61–1.24)
GFR, Estimated: >60 mL/min (ref >60)
Glucose, Bld: 224 mg/dL — ABNORMAL HIGH (ref 70–99)
Potassium: 4.7 mmol/L (ref 3.5–5.1)
Sodium: 136 mmol/L (ref 135–145)

## 2022-10-05 MED ORDER — SPIRONOLACTONE 25 MG PO TABS: 25.0000 mg | ORAL_TABLET | Freq: Every day | ORAL | 3 refills | Status: DC

## 2022-10-05 MED ORDER — METFORMIN HCL 1000 MG PO TABS: 1000.0000 mg | ORAL_TABLET | Freq: Two times a day (BID) | ORAL | 1 refills | Status: DC

## 2022-10-05 MED ORDER — FUROSEMIDE 20 MG PO TABS: 20.0000 mg | ORAL_TABLET | Freq: Every day | ORAL | 11 refills | Status: DC

## 2022-10-05 NOTE — Patient Instructions (Addendum)
It was a pleasure seeing you today!  MEDICATIONS: -We are changing your medications today -Increase spironolactone to 25 mg (1 tablet) daily -Increase metformin to 1000 mg (1 tablet) twice daily. You may take 500 mg every morning and 1000 mg at night for 1-2 weeks before increasing again to help with stomach upset. -Call if you have questions about your medications.  LABS: -We will call you if your labs need attention.  NEXT APPOINTMENT: Return to clinic in 2 weeks with Dr. Gala Romney.   In general, to take care of your heart failure: -Limit your fluid intake to 2 Liters (half-gallon) per day.   -Limit your salt intake to ideally 2-3 grams (2000-3000 mg) per day. -Weigh yourself daily and record, and bring that "weight diary" to your next appointment.  (Weight gain of 2-3 pounds in 1 day typically means fluid weight.) -The medications for your heart are to help your heart and help you live longer.   -Please contact us before stopping any of your heart medications.  Call the clinic at [phone] with questions or to reschedule future appointments.      You are scheduled for a Cardiac Catheterization on Friday, June 14 with Dr. Arvilla Meres.  1. Please arrive at the [facility] (Main Entrance A) at [facility]: [address] [place], [name] [address] at 5:30 AM (This time is 2 hour(s) before your procedure to ensure your preparation). Free valet parking service is available. You will check in at ADMITTING. The support person will be asked to wait in the waiting room.  It is OK to have someone drop you off and come back when you are ready to be discharged.    Special note: Every effort is made to have your procedure done on time. Please understand that emergencies sometimes delay scheduled procedures.  2. Diet: Do not eat solid foods after midnight.  The patient may have clear liquids until 5am upon the day of the procedure.  3. Labs: pre procedure labs are not  needed (Labs done 09/20/22 and 10/05/22)  4. Medication instructions in preparation for your procedure:   Contrast Allergy: No      Stop taking, Lasix (Furosemide)  Friday, June 14,  Take only 8 units of insulin the night before your procedure. Do not take any insulin on the day of the procedure.  Do not take Diabetes Med Glucophage (Metformin) on the day of the procedure and HOLD 48 HOURS AFTER THE PROCEDURE.  On the morning of your procedure, take your Aspirin 81 mg and any morning medicines NOT listed above.  You may use sips of water.  5. Plan to go home the same day, you will only stay overnight if medically necessary. 6. Bring a current list of your medications and current insurance cards. 7. You MUST have a responsible person to drive you home. 8. Someone MUST be with you the first 24 hours after you arrive home or your discharge will be delayed. 9. Please wear clothes that are easy to get on and off and wear slip-on shoes.  Thank you for allowing [place] to care for you!   -- [facility]

## 2022-10-06 ENCOUNTER — Other Ambulatory Visit: Payer: Self-pay | Admitting: Internal Medicine

## 2022-10-10 NOTE — Telephone Encounter (Signed)
The comment on the note taking glargine off his medication list reads "patient not taking" and "has not taken in the past 30 days". Message left for patient to call the office to confirm or negate taking glargine 16 units daily.  No appointments scheduled with our office. He was due in May for a follow up.

## 2022-10-14 ENCOUNTER — Ambulatory Visit (HOSPITAL_COMMUNITY)
Admission: RE | Admit: 2022-10-14 | Discharge: 2022-10-14 | Disposition: A | Discharge status: Home / Self Care | Payer: BC Managed Care – PPO | Attending: Internal Medicine | Admitting: Internal Medicine

## 2022-10-14 ENCOUNTER — Other Ambulatory Visit: Payer: Self-pay

## 2022-10-14 ENCOUNTER — Ambulatory Visit (HOSPITAL_COMMUNITY)
Admission: RE | Disposition: A | Discharge status: Home / Self Care | Payer: Self-pay | Source: Home / Self Care | Attending: Internal Medicine

## 2022-10-14 DIAGNOSIS — I6523 Occlusion and stenosis of bilateral carotid arteries: Secondary | ICD-10-CM | POA: Insufficient documentation

## 2022-10-14 DIAGNOSIS — I2510 Atherosclerotic heart disease of native coronary artery without angina pectoris: Secondary | ICD-10-CM

## 2022-10-14 DIAGNOSIS — I5022 Chronic systolic (congestive) heart failure: Secondary | ICD-10-CM

## 2022-10-14 DIAGNOSIS — Z8546 Personal history of malignant neoplasm of prostate: Secondary | ICD-10-CM | POA: Diagnosis not present

## 2022-10-14 DIAGNOSIS — Z79899 Other long term (current) drug therapy: Secondary | ICD-10-CM | POA: Diagnosis not present

## 2022-10-14 DIAGNOSIS — E785 Hyperlipidemia, unspecified: Secondary | ICD-10-CM | POA: Diagnosis not present

## 2022-10-14 DIAGNOSIS — I428 Other cardiomyopathies: Secondary | ICD-10-CM | POA: Insufficient documentation

## 2022-10-14 DIAGNOSIS — I110 Hypertensive heart disease with heart failure: Secondary | ICD-10-CM | POA: Diagnosis present

## 2022-10-14 DIAGNOSIS — I447 Left bundle-branch block, unspecified: Secondary | ICD-10-CM | POA: Diagnosis not present

## 2022-10-14 DIAGNOSIS — E1165 Type 2 diabetes mellitus with hyperglycemia: Secondary | ICD-10-CM | POA: Insufficient documentation

## 2022-10-14 DIAGNOSIS — Z923 Personal history of irradiation: Secondary | ICD-10-CM | POA: Diagnosis not present

## 2022-10-14 DIAGNOSIS — Z87891 Personal history of nicotine dependence: Secondary | ICD-10-CM | POA: Insufficient documentation

## 2022-10-14 HISTORY — PX: RIGHT/LEFT HEART CATH AND CORONARY ANGIOGRAPHY: CATH118266

## 2022-10-14 LAB — POCT I-STAT 7, (LYTES, BLD GAS, ICA,H+H)
Acid-base deficit: 4 mmol/L — ABNORMAL HIGH (ref 0.0–2.0)
Bicarbonate: 20.6 mmol/L (ref 20.0–28.0)
Calcium, Ion: 1.14 mmol/L — ABNORMAL LOW (ref 1.15–1.40)
HCT: 31 % — ABNORMAL LOW (ref 39.0–52.0)
Hemoglobin: 10.5 g/dL — ABNORMAL LOW (ref 13.0–17.0)
O2 Saturation: 97 %
Potassium: 4.4 mmol/L (ref 3.5–5.1)
Sodium: 140 mmol/L (ref 135–145)
TCO2: 22 mmol/L (ref 22–32)
pCO2 arterial: 33.8 mmHg (ref 32–48)
pH, Arterial: 7.392 (ref 7.35–7.45)
pO2, Arterial: 96 mmHg (ref 83–108)

## 2022-10-14 LAB — POCT I-STAT EG7
Acid-base deficit: 1 mmol/L (ref 0.0–2.0)
Acid-base deficit: 2 mmol/L (ref 0.0–2.0)
Bicarbonate: 22.7 mmol/L (ref 20.0–28.0)
Bicarbonate: 23.3 mmol/L (ref 20.0–28.0)
Calcium, Ion: 1.25 mmol/L (ref 1.15–1.40)
Calcium, Ion: 1.26 mmol/L (ref 1.15–1.40)
HCT: 32 % — ABNORMAL LOW (ref 39.0–52.0)
HCT: 33 % — ABNORMAL LOW (ref 39.0–52.0)
Hemoglobin: 10.9 g/dL — ABNORMAL LOW (ref 13.0–17.0)
Hemoglobin: 11.2 g/dL — ABNORMAL LOW (ref 13.0–17.0)
O2 Saturation: 65 %
O2 Saturation: 66 %
Potassium: 4.6 mmol/L (ref 3.5–5.1)
Potassium: 4.8 mmol/L (ref 3.5–5.1)
Sodium: 137 mmol/L (ref 135–145)
Sodium: 139 mmol/L (ref 135–145)
TCO2: 24 mmol/L (ref 22–32)
TCO2: 24 mmol/L (ref 22–32)
pCO2, Ven: 38.4 mmHg — ABNORMAL LOW (ref 44–60)
pCO2, Ven: 38.5 mmHg — ABNORMAL LOW (ref 44–60)
pH, Ven: 7.379 (ref 7.25–7.43)
pH, Ven: 7.391 (ref 7.25–7.43)
pO2, Ven: 34 mmHg (ref 32–45)
pO2, Ven: 35 mmHg (ref 32–45)

## 2022-10-14 LAB — GLUCOSE, CAPILLARY
Glucose-Capillary: 239 mg/dL — ABNORMAL HIGH (ref 70–99)
Glucose-Capillary: 213 mg/dL — ABNORMAL HIGH (ref 70–99)

## 2022-10-14 SURGERY — RIGHT/LEFT HEART CATH AND CORONARY ANGIOGRAPHY
Anesthesia: LOCAL

## 2022-10-14 MED ORDER — SODIUM CHLORIDE 0.9 % IV SOLN: 250.0000 mL | INTRAVENOUS | Status: DC | PRN

## 2022-10-14 MED ORDER — LIDOCAINE HCL (PF) 1 % IJ SOLN
INTRAMUSCULAR | Status: DC | PRN
  Administered 2022-10-14: 2 mL via INTRADERMAL
  Administered 2022-10-14: 5 mL via INTRADERMAL

## 2022-10-14 MED ORDER — ACETAMINOPHEN 325 MG PO TABS: 650.0000 mg | ORAL_TABLET | ORAL | Status: DC | PRN

## 2022-10-14 MED ORDER — LIDOCAINE HCL (PF) 1 % IJ SOLN
INTRAMUSCULAR | Status: AC
  Filled 2022-10-14: qty 30

## 2022-10-14 MED ORDER — HEPARIN SODIUM (PORCINE) 1000 UNIT/ML IJ SOLN
INTRAMUSCULAR | Status: AC
  Filled 2022-10-14: qty 10

## 2022-10-14 MED ORDER — VERAPAMIL HCL 2.5 MG/ML IV SOLN
INTRAVENOUS | Status: AC
  Filled 2022-10-14: qty 2

## 2022-10-14 MED ORDER — MIDAZOLAM HCL 2 MG/2ML IJ SOLN
INTRAMUSCULAR | Status: AC
  Filled 2022-10-14: qty 2

## 2022-10-14 MED ORDER — SODIUM CHLORIDE 0.9% FLUSH: 3.0000 mL | Freq: Two times a day (BID) | INTRAVENOUS | Status: DC

## 2022-10-14 MED ORDER — VERAPAMIL HCL 2.5 MG/ML IV SOLN
INTRAVENOUS | Status: DC | PRN
  Administered 2022-10-14: 10 mL via INTRA_ARTERIAL

## 2022-10-14 MED ORDER — SODIUM CHLORIDE 0.9 % IV SOLN: INTRAVENOUS | Status: DC

## 2022-10-14 MED ORDER — SODIUM CHLORIDE 0.9% FLUSH: 3.0000 mL | INTRAVENOUS | Status: DC | PRN

## 2022-10-14 MED ORDER — HYDRALAZINE HCL 20 MG/ML IJ SOLN: 10.0000 mg | INTRAMUSCULAR | Status: DC | PRN

## 2022-10-14 MED ORDER — IOHEXOL 350 MG/ML SOLN
INTRAVENOUS | Status: DC | PRN
  Administered 2022-10-14: 45 mL

## 2022-10-14 MED ORDER — FENTANYL CITRATE (PF) 100 MCG/2ML IJ SOLN
INTRAMUSCULAR | Status: AC
  Filled 2022-10-14: qty 2

## 2022-10-14 MED ORDER — HEPARIN (PORCINE) IN NACL 1000-0.9 UT/500ML-% IV SOLN
INTRAVENOUS | Status: DC | PRN
  Administered 2022-10-14 (×3): 500 mL

## 2022-10-14 MED ORDER — ONDANSETRON HCL 4 MG/2ML IJ SOLN: 4.0000 mg | Freq: Four times a day (QID) | INTRAMUSCULAR | Status: DC | PRN

## 2022-10-14 MED ORDER — HEPARIN SODIUM (PORCINE) 1000 UNIT/ML IJ SOLN
INTRAMUSCULAR | Status: DC | PRN
  Administered 2022-10-14: 4000 [IU] via INTRAVENOUS

## 2022-10-14 MED ORDER — LABETALOL HCL 5 MG/ML IV SOLN: 10.0000 mg | INTRAVENOUS | Status: DC | PRN

## 2022-10-14 MED ORDER — ASPIRIN 81 MG PO CHEW: 81.0000 mg | CHEWABLE_TABLET | ORAL | Status: DC

## 2022-10-14 SURGICAL SUPPLY — 10 items
CATH 5FR JL3.5 JR4 ANG PIG MP (CATHETERS) IMPLANT
CATH BALLN WEDGE 5F 110CM (CATHETERS) IMPLANT
DEVICE RAD COMP TR BAND LRG (VASCULAR PRODUCTS) IMPLANT
GLIDESHEATH SLEND SS 6F .021 (SHEATH) IMPLANT
GUIDEWIRE .025 260CM (WIRE) IMPLANT
GUIDEWIRE INQWIRE 1.5J.035X260 (WIRE) IMPLANT
INQWIRE 1.5J .035X260CM (WIRE) ×1
PACK CARDIAC CATHETERIZATION (CUSTOM PROCEDURE TRAY) ×1 IMPLANT
SHEATH GLIDE SLENDER 4/5FR (SHEATH) IMPLANT
TRANSDUCER W/STOPCOCK (MISCELLANEOUS) ×1 IMPLANT

## 2022-10-14 NOTE — Progress Notes (Signed)
Dr aware pt has no one to stay with him post LHC. No sedation was given, pt has a ride home, neighbor will check on him.

## 2022-10-14 NOTE — H&P (Signed)
[name]     Referring Physician: Dr. Ned Card Primary Care: Rana Snare, DO Primary Cardiologist: [name], MD   HPI: Referred to clinic by Dr. Ned Card for heart failure consultation.   62 y/o AAM w/ h/o chronic systolic heart failure, felt primarily due to NICM, CAD, Prostate Cancer treated w/ radiation in 2022, urinary incontinence, HTN, Type 2DM and h/o poor compliance, citing previous inability to afford meds.   Echo 1/22 EF 25-30%, RV normal. Subsequent cath showed single vessel obstructive CAD involving a ramus intermediate branch that was 80% stenosed, 35% ostial-mid LAD, 50% distal LAD, 60% prox-mid RCA, 1st diag, 50% ost-prox LCx 50% distal RCA, 30%. LV dysfunction was out of proportion to the degree of CAD. Medical management of CAD was recommended. RHC demonstrated normal filling pressures and preserved output (mRA 2, PAP 16/4, mPCW 5, CO 5.42, CI 2.79).  Repeat echo 10/22, EF improved to 45-50%, RV normal.   Also w/ carotid artery stenosis. Dopplers followed annually by Dr. Allyson Sabal. Most recent study 3/24 showed 40-59% Rt ICA stenosis, 1-30% Lt ICA stenosis.   Admitted 4/24 for a/c CHF. Reported being off all HF meds x 4 months, citing inability to afford refills. He was hypertensive on admission. Echo showed EF back down again, 25%, RV mildly reduced, mild MR. He was diuresed w/ IV lasix and restarted on GDMT. Labs were also c/w IDA. Tsat 12%, Ferritin 94, advised to start PO iron at d/c.   Seen in [facility] and concern that persistent LV dysfunction may be due to progressive CAD. Plan for R/L cath  Presents today for R/L cath. Stable NYHA II-III symptoms. No significant angina.   Cardiac Testing   Echo 1/22  EF 25-30%, RV normal   R/LHC 1/22  Prox RCA to Mid RCA lesion is 60% stenosed. Dist RCA lesion is 50% stenosed. Ost LAD to Mid LAD lesion is 35% stenosed. 1st Diag lesion is 30% stenosed. Dist LAD lesion is 50%  stenosed. Ramus lesion is 80% stenosed. Ost Cx to Prox Cx lesion is 50% stenosed. LV end diastolic pressure is normal.   1. Single vessel obstructive CAD involving a ramus intermediate branch 2. Normal right heart pressures. PAP mean 11 mm Hg 3. Low LV filling pressures. PCWP 5 mm Hg.  4. Preserved cardiac output with index 2.79.    Plan: LV dysfunction is out of proportion to degree of CAD. Recommend  medical therapy. Optimize CHF therapy.   Echo 4/24 1. Left ventricular ejection fraction, by estimation, is 25%. The left  ventricle has severely decreased function. The left ventricle demonstrates  global hypokinesis. Left ventricular diastolic parameters are consistent  with Grade II diastolic  dysfunction (pseudonormalization).   2. Right ventricular systolic function is mildly reduced. The right  ventricular size is normal. Tricuspid regurgitation signal is inadequate  for assessing PA pressure.   3. Left atrial size was mildly dilated.   4. The mitral valve is normal in structure. Mild mitral valve  regurgitation. No evidence of mitral stenosis.   5. The aortic valve is tricuspid. Aortic valve regurgitation is not  visualized. No aortic stenosis is present.   6. The inferior vena cava is normal in size with greater than 50%  respiratory variability, suggesting right atrial pressure of 3 mmHg.     Review of Systems: [y] = yes, [ ]  = no   General: Weight gain [ ] ; Weight loss [ ] ; Anorexia [ ] ; Fatigue [  Y]; Fever [ ] ; Chills [ ] ; Weakness [ ]   Cardiac: Chest pain/pressure [ ] ; Resting SOB [ ] ; Exertional SOB [ Y]; Orthopnea [ ] ; Pedal Edema [ ] ; Palpitations [ ] ; Syncope [ ] ; Presyncope [ ] ; Paroxysmal nocturnal dyspnea[ ]   Pulmonary: Cough [ ] ; Wheezing[ ] ; Hemoptysis[ ] ; Sputum [ ] ; Snoring [ ]   GI: Vomiting[ ] ; Dysphagia[ ] ; Melena[ ] ; Hematochezia [ ] ; Heartburn[ ] ; Abdominal pain [ ] ; Constipation [ ] ; Diarrhea [ ] ; BRBPR [ ]   GU: Hematuria[ ] ; Dysuria [ ] ; Nocturia[ ]    Vascular: Pain in legs with walking [ ] ; Pain in feet with lying flat [ ] ; Non-healing sores [ ] ; Stroke [ ] ; TIA [ ] ; Slurred speech [ ] ;  Neuro: Headaches[ ] ; Vertigo[ ] ; Seizures[ ] ; Paresthesias[ ] ;Blurred vision [ ] ; Diplopia [ ] ; Vision changes [ ]   Ortho/Skin: Arthritis Cove.Etienne ]; Joint pain Cove.Etienne ]; Muscle pain [ ] ; Joint swelling [ ] ; Back Pain [ ] ; Rash [ ]   Psych: Depression[ ] ; Anxiety[ ]   Heme: Bleeding problems [ ] ; Clotting disorders [ ] ; Anemia [ ]   Endocrine: Diabetes [ Y]; Thyroid dysfunction[ ]    Past Medical History:  Diagnosis Date   Anemia    Diabetes mellitus without complication (HCC)    Family history of breast cancer    Hypertension    Myocardial infarction [facility])    Prostate cancer (HCC)     Current Facility-Administered Medications  Medication Dose Route Frequency Provider Last Rate Last Admin   0.9 %  sodium chloride infusion   Intravenous Continuous Camden Knotek, Bevelyn Buckles, MD 10 mL/hr at 10/14/22 0545 New Bag at 10/14/22 0545   aspirin chewable tablet 81 mg  81 mg Oral Pre-Cath Nylani Michetti, Bevelyn Buckles, MD        No Known Allergies    Social History   Socioeconomic History   Marital status: Divorced    Spouse name: Not on file   Number of children: 3   Years of education: Not on file   Highest education level: Associate degree: academic program  Occupational History   Occupation: [employer]  Tobacco Use   Smoking status: Former    Packs/day: 1.00    Years: 43.00    Additional pack years: 0.00    Total pack years: 43.00    Types: Cigarettes    Quit date: 05/22/2017    Years since quitting: 5.4   Smokeless tobacco: Never  Vaping Use   Vaping Use: Never used  Substance and Sexual Activity   Alcohol use: Not Currently    Comment: last drink 2015   Drug use: Not Currently    Comment: none in 7 years   Sexual activity: Not Currently  Other Topics Concern   Not on file  Social History Narrative   3 sons   Social Determinants of Health   Financial  Resource Strain: Low Risk  (08/19/2022)   Overall Financial Resource Strain (CARDIA)    Difficulty of Paying Living Expenses: Not very hard  Food Insecurity: No Food Insecurity (08/19/2022)   Hunger Vital Sign    Worried About Running Out of Food in the Last Year: Never true    Ran Out of Food in the Last Year: Never true  Transportation Needs: No Transportation Needs (08/19/2022)   PRAPARE - Administrator, Civil Service (Medical): No    Lack of Transportation (Non-Medical): No  Physical Activity: Not on file  Stress: Not on file  Social Connections: Not on file  Intimate Partner Violence: Not on file      Family History  Problem Relation Age of Onset   Breast cancer Mother        dx 30s/40s, twice   Cancer Maternal Grandfather        unknown type, d. >50    Vitals:   10/14/22 0523 10/14/22 0737  BP: (!) 154/85   Pulse: 85   Resp: 18   Temp: (!) 97.2 F (36.2 C)   TempSrc: Temporal   SpO2: 100% 99%  Weight: 81.6 kg   Height: 6' (1.829 m)     PHYSICAL EXAM: General:  No resp difficulty HEENT: normal Neck: supple. JVP 7-8. Carotids 2+ bilat; no bruits. No lymphadenopathy or thryomegaly appreciated. Cor: PMI nondisplaced. Regular rate & rhythm. No rubs, gallops or murmurs. Lungs: clear Abdomen: soft, nontender, nondistended. No hepatosplenomegaly. No bruits or masses. Good bowel sounds. Extremities: no cyanosis, clubbing, rash, edema Neuro: alert & orientedx3, cranial nerves grossly intact. moves all 4 extremities w/o difficulty. Affect pleasant    ASSESSMENT & PLAN:  1. Chronic Systolic Heart Failure - Diagnosed 2022. Echo 1/22 EF 25-30%, RV normal. Subsequent cath showed single vessel obstructive CAD involving a ramus intermediate branch that was 80% stenosed, 35% ostial-mid LAD, 50% distal LAD, 60% prox-mid RCA, 1st diag, 50% ost-prox LCx 50% distal RCA, 30%. LV dysfunction was out of proportion to the degree of CAD. RHC demonstrated normal filling  pressures and preserved output (mRA 2, PAP 16/4, mPCW 5, CO 5.42, CI 2.79). - Repeat Echo 10/22 EF improved to 45-50%, RV normal. - Echo 4/24 EF 25%, RV mildly reduced, mild MR (also off meds x 4 months)  - NYHA Class II-early III. Mild volume overload on exam. ReDs 41%. EKG ST w/ LBBB  - Continue Spiro 12.5 mg daily  - Continue digoxin 0.125 mg daily  - Continue Entresto 49-51 mg bid  - Holding SGLT2i initiation given poorly controlled DM, Hgb A1c 13.6 - Lasix 20 mg PRN  - Continue Toprol XL 25 mg daily  - concern for CAD progression. Plan repeat R/LHC  - For R/L cath today. If CAD stable consider CRT-D in setting of low EF and LBBB>   2. CAD - mod CAD on cath in 2022 - denies CP, however w/ worsening HF, poorly controlled DM and HLD, there is concern for disease progression  - Plan repeat R/LHC today - continue statin + ? blocker - start ASA 81 mg daily   3. LBBB - suspect contributory to HF - will need to be evaluated for CRT-D but needs improved glycemic control first   4. Type 2DM  - poorly controlled, Hgb A1c 13.6 - needs to improve compliance w/ insulin regimen - Needs f/u w/ PCP   5. HLD, LDL Goal < 55  - recent lipid panel  LDL elevated at 140 mg/dL (had been off statin) - recently restarted on Lipitor 40 mg  - needs f/u FLP and HFT in 6 wks  6. IDA - Tsat 12%, Ferritin 94 - refer for IV iron   7. Prostate Cancer - completed radiation in 2022 but lost to f/u - has been referred back to radiation oncology and urology   8. Carotid Artery Stenosis  - dopplers followed annually by Dr. Allyson Sabal  - last dopplers 3/24: 40-59% Rt ICA stenosis, 1-30% Lt ICA stenosis - asa + statin   Arvilla Meres, MD  7:38 AM

## 2022-10-17 ENCOUNTER — Encounter (HOSPITAL_COMMUNITY): Payer: Self-pay | Admitting: Internal Medicine

## 2022-10-17 MED FILL — Midazolam HCl Inj 2 MG/2ML (Base Equivalent): INTRAMUSCULAR | Qty: 2 | Status: AC

## 2022-10-17 MED FILL — Fentanyl Citrate Preservative Free (PF) Inj 100 MCG/2ML: INTRAMUSCULAR | Qty: 2 | Status: AC

## 2022-10-19 ENCOUNTER — Encounter (HOSPITAL_COMMUNITY): Payer: Self-pay | Admitting: Internal Medicine

## 2022-10-19 ENCOUNTER — Ambulatory Visit (HOSPITAL_COMMUNITY)
Admission: RE | Admit: 2022-10-19 | Discharge: 2022-10-19 | Disposition: A | Discharge status: Home / Self Care | Payer: BC Managed Care – PPO | Source: Ambulatory Visit | Attending: Internal Medicine | Admitting: Internal Medicine

## 2022-10-19 VITALS — BP 90/50 | HR 78 | Wt 173.4 lb

## 2022-10-19 DIAGNOSIS — R0602 Shortness of breath: Secondary | ICD-10-CM | POA: Diagnosis not present

## 2022-10-19 DIAGNOSIS — I2510 Atherosclerotic heart disease of native coronary artery without angina pectoris: Secondary | ICD-10-CM | POA: Insufficient documentation

## 2022-10-19 DIAGNOSIS — I5022 Chronic systolic (congestive) heart failure: Secondary | ICD-10-CM | POA: Insufficient documentation

## 2022-10-19 DIAGNOSIS — I6523 Occlusion and stenosis of bilateral carotid arteries: Secondary | ICD-10-CM | POA: Diagnosis not present

## 2022-10-19 DIAGNOSIS — E785 Hyperlipidemia, unspecified: Secondary | ICD-10-CM | POA: Insufficient documentation

## 2022-10-19 DIAGNOSIS — I447 Left bundle-branch block, unspecified: Secondary | ICD-10-CM | POA: Insufficient documentation

## 2022-10-19 DIAGNOSIS — E1165 Type 2 diabetes mellitus with hyperglycemia: Secondary | ICD-10-CM | POA: Insufficient documentation

## 2022-10-19 DIAGNOSIS — Z79899 Other long term (current) drug therapy: Secondary | ICD-10-CM | POA: Diagnosis not present

## 2022-10-19 DIAGNOSIS — I110 Hypertensive heart disease with heart failure: Secondary | ICD-10-CM | POA: Diagnosis not present

## 2022-10-19 DIAGNOSIS — Z794 Long term (current) use of insulin: Secondary | ICD-10-CM | POA: Insufficient documentation

## 2022-10-19 DIAGNOSIS — Z8546 Personal history of malignant neoplasm of prostate: Secondary | ICD-10-CM | POA: Insufficient documentation

## 2022-10-19 DIAGNOSIS — Z7982 Long term (current) use of aspirin: Secondary | ICD-10-CM | POA: Diagnosis not present

## 2022-10-19 DIAGNOSIS — Z7984 Long term (current) use of oral hypoglycemic drugs: Secondary | ICD-10-CM | POA: Diagnosis not present

## 2022-10-19 LAB — BASIC METABOLIC PANEL
Anion gap: 9 (ref 5–15)
BUN: 29 mg/dL — ABNORMAL HIGH (ref 8–23)
CO2: 24 mmol/L (ref 22–32)
Calcium: 9 mg/dL (ref 8.9–10.3)
Chloride: 101 mmol/L (ref 98–111)
Creatinine, Ser: 1.39 mg/dL — ABNORMAL HIGH (ref 0.61–1.24)
GFR, Estimated: 57 mL/min — ABNORMAL LOW (ref >60)
Glucose, Bld: 271 mg/dL — ABNORMAL HIGH (ref 70–99)
Potassium: 5.4 mmol/L — ABNORMAL HIGH (ref 3.5–5.1)
Sodium: 134 mmol/L — ABNORMAL LOW (ref 135–145)

## 2022-10-19 NOTE — Progress Notes (Signed)
ADVANCED HF CLINIC NOTE     Referring Physician: Dr. Ned Card Primary Care: Rana Snare, DO Primary Cardiologist: [name], MD   HPI: 62 y/o AAM w/ h/o chronic systolic heart failure, felt primarily due to NICM, CAD, Prostate Cancer treated w/ radiation in 2022, urinary incontinence, HTN, Type 2DM and h/o poor compliance, citing previous inability to afford meds.   Echo 1/22 EF 25-30%, RV normal. Subsequent cath showed single vessel obstructive CAD involving a ramus intermediate branch that was 80% stenosed, 35% ostial-mid LAD, 50% distal LAD, 60% prox-mid RCA, 1st diag, 50% ost-prox LCx 50% distal RCA, 30%. LV dysfunction was out of proportion to the degree of CAD. Medical management of CAD was recommended. RHC demonstrated normal filling pressures and preserved output (mRA 2, PAP 16/4, mPCW 5, CO 5.42, CI 2.79).  Repeat echo 10/22, EF improved to 45-50%, RV normal.   Also w/ carotid artery stenosis. Dopplers followed annually by Dr. Allyson Sabal. Most recent study 3/24 showed 40-59% Rt ICA stenosis, 1-30% Lt ICA stenosis.   Admitted 4/24 for a/c CHF. Reported being off all HF meds x 4 months, citing inability to afford refills. He was hypertensive on admission. Echo showed EF back down again, 25%, RV mildly reduced, mild MR. He was diuresed w/ IV lasix and restarted on GDMT. Labs were also c/w IDA. Tsat 12%, Ferritin 94, advised to start PO iron at d/c.   Seen in [facility] and concern that persistent LV dysfunction may be due to progressive CAD. Set up for repeat R/L cath  Today he returns for HF follow up.Overall feeling fine. He avoids steps because he gets short of breath.  Denies PND/Orthopnea. Denies chest pain. Appetite ok. No fever or chills. Weight at home  170-175 pounds. Taking all medications. He continues to work full time at [employer]. He  uses FMLA  1-2 days a week.    R/L cath 10/14/22 Stable 3v non-obstructive CAD. There is 70% lesion in proximal to mid Lcx which does  not appear flow-limiting. LVEF appears reduced out of proportion to CAD  Ao = 144/69 (101) LV = 142/12 RA = 1 RV = 29/5 PA = 30/7 (19) PCW = 15 Fick cardiac output/index = 5.9 PVR = 0.7 WU FA sat = 97% PA sat = 68%, 68%   Echo 4/24 1. Left ventricular ejection fraction, by estimation, is 25%. The left  ventricle has severely decreased function. The left ventricle demonstrates  global hypokinesis. Left ventricular diastolic parameters are consistent  with Grade II diastolic  dysfunction (pseudonormalization).   2. Right ventricular systolic function is mildly reduced. The right  ventricular size is normal. Tricuspid regurgitation signal is inadequate  for assessing PA pressure.   3. Left atrial size was mildly dilated.   4. The mitral valve is normal in structure. Mild mitral valve  regurgitation. No evidence of mitral stenosis.   5. The aortic valve is tricuspid. Aortic valve regurgitation is not  visualized. No aortic stenosis is present.   6. The inferior vena cava is normal in size with greater than 50%  respiratory variability, suggesting right atrial pressure of 3 mmHg.      Past Medical History:  Diagnosis Date   Anemia    Diabetes mellitus without complication (HCC)    Family history of breast cancer    Hypertension    Myocardial infarction [facility])    Prostate cancer [facility])     Current Outpatient Medications  Medication Sig Dispense Refill   Accu-Chek Softclix  Lancets lancets SMARTSIG:Topical 1-4 Times Daily 100 each 0   aspirin EC 81 MG tablet Take 1 tablet (81 mg total) by mouth daily. Swallow whole. 90 tablet 3   atorvastatin (LIPITOR) 40 MG tablet TAKE 1 TABLET BY MOUTH EVERY DAY 90 tablet 1   blood glucose meter kit and supplies KIT Dispense based on patient and insurance preference. Use up to four times daily as directed. (FOR ICD-9 250.00, 250.01). 1 each 0   Continuous Glucose Receiver (DEXCOM G7 RECEIVER) DEVI Use as directed 1 each 0   digoxin  (LANOXIN) 0.125 MG tablet Take 1 tablet (0.125 mg total) by mouth daily. 90 tablet 3   ferrous sulfate 325 (65 FE) MG tablet TAKE 1 TABLET BY MOUTH EVERY DAY 90 tablet 1   furosemide (LASIX) 20 MG tablet Take 1 tablet (20 mg total) by mouth daily. 30 tablet 11   gabapentin (NEURONTIN) 300 MG capsule TAKE 1 CAPSULE BY MOUTH TWICE A DAY 60 capsule 0   insulin glargine (LANTUS SOLOSTAR) 100 UNIT/ML Solostar Pen INJECT 16 UNITS INTO THE SKIN AT BEDTIME. 15 mL 0   metFORMIN (GLUCOPHAGE) 1000 MG tablet Take 1 tablet (1,000 mg total) by mouth 2 (two) times daily with a meal. 60 tablet 1   metoprolol succinate (TOPROL-XL) 25 MG 24 hr tablet TAKE 1 TABLET BY MOUTH DAILY 90 tablet 1   Multiple Vitamin (MULTIVITAMIN) tablet Take 1 tablet by mouth daily.     sacubitril-valsartan (ENTRESTO) 97-103 MG Take 1 tablet by mouth 2 (two) times daily. 180 tablet 3   spironolactone (ALDACTONE) 25 MG tablet Take 1 tablet (25 mg total) by mouth daily. 90 tablet 3   tamsulosin (FLOMAX) 0.4 MG CAPS capsule TAKE 1 CAPSULE BY MOUTH EVERYDAY AT BEDTIME 90 capsule 1   No current facility-administered medications for this encounter.    No Known Allergies    Social History   Socioeconomic History   Marital status: Divorced    Spouse name: Not on file   Number of children: 3   Years of education: Not on file   Highest education level: Associate degree: academic program  Occupational History   Occupation: [employer]  Tobacco Use   Smoking status: Former    Packs/day: 1.00    Years: 43.00    Additional pack years: 0.00    Total pack years: 43.00    Types: Cigarettes    Quit date: 05/22/2017    Years since quitting: 5.4   Smokeless tobacco: Never  Vaping Use   Vaping Use: Never used  Substance and Sexual Activity   Alcohol use: Not Currently    Comment: last drink 2015   Drug use: Not Currently    Comment: none in 7 years   Sexual activity: Not Currently  Other Topics Concern   Not on file  Social History  Narrative   3 sons   Social Determinants of Health   Financial Resource Strain: Low Risk  (08/19/2022)   Overall Financial Resource Strain (CARDIA)    Difficulty of Paying Living Expenses: Not very hard  Food Insecurity: No Food Insecurity (08/19/2022)   Hunger Vital Sign    Worried About Running Out of Food in the Last Year: Never true    Ran Out of Food in the Last Year: Never true  Transportation Needs: No Transportation Needs (08/19/2022)   PRAPARE - Administrator, Civil Service (Medical): No    Lack of Transportation (Non-Medical): No  Physical Activity: Not on file  Stress:  Not on file  Social Connections: Not on file  Intimate Partner Violence: Not on file      Family History  Problem Relation Age of Onset   Breast cancer Mother        dx 30s/40s, twice   Cancer Maternal Grandfather        unknown type, d. >50    Vitals:   10/19/22 1520  BP: (!) 90/50  Pulse: 78  SpO2: 98%  Weight: 78.7 kg (173 lb 6.4 oz)   Wt Readings from Last 3 Encounters:  10/19/22 78.7 kg (173 lb 6.4 oz)  10/14/22 81.6 kg (180 lb)  10/05/22 80.4 kg (177 lb 3.2 oz)    PHYSICAL EXAM: General:  Well appearing. No resp difficulty HEENT: normal Neck: supple. no JVD. Carotids 2+ bilat; no bruits. No lymphadenopathy or thryomegaly appreciated. Cor: PMI nondisplaced. Regular rate & rhythm. No rubs, gallops or murmurs. Lungs: clear Abdomen: soft, nontender, nondistended. No hepatosplenomegaly. No bruits or masses. Good bowel sounds. Extremities: no cyanosis, clubbing, rash, edema Neuro: alert & orientedx3, cranial nerves grossly intact. moves all 4 extremities w/o difficulty. Affect pleasant  ASSESSMENT & PLAN:  1. Chronic Systolic Heart Failure - Diagnosed 2022. Echo 1/22 EF 25-30%, RV normal. Subsequent cath showed single vessel obstructive CAD involving a ramus intermediate branch that was 80% stenosed, 35% ostial-mid LAD, 50% distal LAD, 60% prox-mid RCA, 1st diag, 50% ost-prox  LCx 50% distal RCA, 30%. LV dysfunction was out of proportion to the degree of CAD. RHC demonstrated normal filling pressures and preserved output (mRA 2, PAP 16/4, mPCW 5, CO 5.42, CI 2.79). - Repeat Echo 10/22 EF improved to 45-50%, RV normal. - Echo 4/24 EF 25%, RV mildly reduced, mild MR (also off meds x 4 months)  - R/l Cath 6/24: stable non-obstructive CAD. Well-compensated hemodynamics - NYHA Class II - Continue Spiro 25 mg daily.   - Continue digoxin 0.125 mg daily  - Continue Entresto 97-103  mg bid  - Holding SGLT2i initiation given poorly controlled DM, Hgb A1c 11.2 - Lasix 20 mg PRN  - Continue Toprol XL 25 mg daily  - Refer for CRT-D when DM2 improved - Set up CMRI to further assess EF and etiology of CM - Labs today   2. CAD - mod CAD on cath in 2022 and 6/24 - continue ASA + statin + ? blocker - no s/s angina  3. LBBB - suspect contributory to HF - will need to be evaluated for CRT-D but needs improved glycemic control first  - As above plan cMRI.   4. DM2 - poorly controlled, Hgb A1c 11.2  - needs to improve compliance w/ insulin regimen - Needs f/u w/ PCP . He was given a packet to select PCP. He has not followed up.   5. Hyperlipidemia - recent lipid panel  LDL elevated at 140 mg/dL (had been off statin) - recently restarted on Lipitor 40 mg  - needs f/u FLP and HFT in 6 wk  6. Prostate Cancer - completed radiation in 2022 but lost to f/u - has been referred back to radiation oncology and urology   7. Carotid Artery Stenosis  - dopplers followed annually by Dr. Allyson Sabal  - last dopplers 3/24: 40-59% Rt ICA stenosis, 1-30% Lt ICA stenosis - asa + statin    Arvilla Meres, MD  3:33 PM

## 2022-10-19 NOTE — Patient Instructions (Addendum)
Medication Changes:  No Changes In Medications at this time.   Lab Work:  Labs done today, your results will be available in MyChart, we will contact you for abnormal readings.   Testing/Procedures:  Your physician has requested that you have a cardiac MRI. Cardiac MRI uses a computer to create images of your heart as its beating, producing both still and moving pictures of your heart and major blood vessels. For further information please visit [email or web address]. Please follow the instruction sheet given to you today for more information.  ONCE APPROVED WITH INSURANCE SOMEONE WILL CALL TO GET YOU SCHEDULED FOR THIS  Follow-Up in: 3 MONTHS WITH Dr. Gala Romney- PLEASE CALL OUR OFFICE TO GET THIS SCHEDULED AROUND AUGUST 2024 NUMBER IS [phone] OPTION 2   At the [facility], you and your health needs are our priority. We have a designated team specialized in the treatment of Heart Failure. This Care Team includes your primary Heart Failure Specialized Cardiologist (physician), Advanced Practice Providers (APPs- Physician Assistants and Nurse Practitioners), and Pharmacist who all work together to provide you with the care you need, when you need it.   You may see any of the following providers on your designated Care Team at your next follow up:  Dr. Arvilla Meres Dr. Marca Ancona Dr. Marcos Eke, NP Robbie Lis, [place] [facility] [place], [place] Brynda Peon, NP Karle Plumber, PharmD   Please be sure to bring in all your medications bottles to every appointment.   Need to Contact us:  If you have any questions or concerns before your next appointment please send [place] a message through Mier or call our office at [phone].    TO LEAVE A MESSAGE FOR THE NURSE SELECT OPTION 2, PLEASE LEAVE A MESSAGE INCLUDING: YOUR NAME DATE OF BIRTH CALL BACK NUMBER REASON FOR CALL**this is important as we prioritize the call backs  YOU WILL  RECEIVE A CALL BACK THE SAME DAY AS LONG AS YOU CALL BEFORE 4:00 PM

## 2022-10-28 ENCOUNTER — Other Ambulatory Visit (HOSPITAL_COMMUNITY): Payer: Self-pay | Admitting: Internal Medicine

## 2022-11-22 ENCOUNTER — Encounter: Payer: Self-pay | Admitting: Student

## 2022-11-22 ENCOUNTER — Other Ambulatory Visit: Payer: Self-pay | Admitting: Dietician

## 2022-11-22 ENCOUNTER — Ambulatory Visit (INDEPENDENT_AMBULATORY_CARE_PROVIDER_SITE_OTHER): Payer: BC Managed Care – PPO | Admitting: Dietician

## 2022-11-22 ENCOUNTER — Encounter: Payer: Self-pay | Admitting: Dietician

## 2022-11-22 ENCOUNTER — Ambulatory Visit: Payer: BC Managed Care – PPO | Admitting: Student

## 2022-11-22 VITALS — BP 135/66 | HR 92 | Temp 98.8°F | Ht 72.0 in | Wt 178.8 lb

## 2022-11-22 DIAGNOSIS — Z7984 Long term (current) use of oral hypoglycemic drugs: Secondary | ICD-10-CM

## 2022-11-22 DIAGNOSIS — E118 Type 2 diabetes mellitus with unspecified complications: Secondary | ICD-10-CM

## 2022-11-22 DIAGNOSIS — I2510 Atherosclerotic heart disease of native coronary artery without angina pectoris: Secondary | ICD-10-CM | POA: Diagnosis not present

## 2022-11-22 DIAGNOSIS — Z1211 Encounter for screening for malignant neoplasm of colon: Secondary | ICD-10-CM

## 2022-11-22 DIAGNOSIS — E785 Hyperlipidemia, unspecified: Secondary | ICD-10-CM

## 2022-11-22 DIAGNOSIS — I10 Essential (primary) hypertension: Secondary | ICD-10-CM

## 2022-11-22 DIAGNOSIS — E875 Hyperkalemia: Secondary | ICD-10-CM | POA: Insufficient documentation

## 2022-11-22 DIAGNOSIS — Z0000 Encounter for general adult medical examination without abnormal findings: Secondary | ICD-10-CM

## 2022-11-22 DIAGNOSIS — I5022 Chronic systolic (congestive) heart failure: Secondary | ICD-10-CM | POA: Diagnosis not present

## 2022-11-22 DIAGNOSIS — I110 Hypertensive heart disease with heart failure: Secondary | ICD-10-CM | POA: Diagnosis not present

## 2022-11-22 LAB — GLUCOSE, CAPILLARY: Glucose-Capillary: 328 mg/dL — ABNORMAL HIGH (ref 70–99)

## 2022-11-22 LAB — POCT GLYCOSYLATED HEMOGLOBIN (HGB A1C): Hemoglobin A1C: 11.4 % — AB (ref 4.0–5.6)

## 2022-11-22 MED ORDER — CONTOUR NEXT TEST VI STRP
ORAL_STRIP | 11 refills | Status: DC
Start: 2022-11-22 — End: 2023-11-02

## 2022-11-22 MED ORDER — EMPAGLIFLOZIN 10 MG PO TABS: 10.0000 mg | ORAL_TABLET | Freq: Every day | ORAL | 2 refills | Status: DC

## 2022-11-22 NOTE — Assessment & Plan Note (Signed)
Agrees to referral to GI for screening colonoscopy.

## 2022-11-22 NOTE — Assessment & Plan Note (Addendum)
Denies any chest pain and no other acute concerns. Last LDL above goal at 140 due to being off medications. Taking atorvastatin 40 mg daily and ASA 81 mg daily. Reports daily adherence with his medications.   Plan -Continue atorvastatin and ASA -Lipid panel today

## 2022-11-22 NOTE — Assessment & Plan Note (Addendum)
A1c improved to 11.4 from 13.6. Taking metformin 1000 mg BID, tolerating well and reports adherence. Not taking Lantus due to side effects, reports fatigue, generalized pain and hunger with initial few doses. Discussed starting SGLT-2 which patient agrees to, Rx for [employer] sent pending insurance. Previously prescribed Marcelline Deist but unable to obtain due to costs/insurance. No hx of DKA. Denies any acute concerns today.   Plan -Stop Lantus -Continue metformin -Start Jardiance 10 mg daily  -Repeat A1c in 3 months -F/u with Lupita Leash

## 2022-11-22 NOTE — Patient Instructions (Addendum)
Nice to meet you today!  I will request a Contour test strips for you..   I hope the lower carb ideas help.  Can you bring your meter with you to our next visit.   If you have time to download the Dexcom Clarity App- go to settings, under authorize sharing. Type in our access code [facility] [phone]

## 2022-11-22 NOTE — Progress Notes (Signed)
Diabetes Self-Management Education  Visit Type: First/Initial  Appt. Start Time: 1515 Appt. End Time: 1600  11/22/2022  [name], identified by name and date of birth, is a 62 y.o. male with a diagnosis of Diabetes: Type 2.   ASSESSMENT  There were no vitals taken for this visit. There is no height or weight on file to calculate BMI.   Diabetes Self-Management Education - 11/22/22 1600       Visit Information   Visit Type First/Initial      Initial Visit   Diabetes Type Type 2    Date Diagnosed 1980s   he stated that his diabetes went away , then came back later   Are you currently following a meal plan? No    Are you taking your medications as prescribed? No   he stated that he had stomach pain, increased hunger after taking insulin for the first time.  He states metformin is not helping to lwoer his blood sugar.     Health Coping   How would you rate your overall health? --   need to assess at future visit     Psychosocial Assessment   Patient Belief/Attitude about Diabetes Motivated to manage diabetes    What is the hardest part about your diabetes right now, causing you the most concern, or is the most worrisome to you about your diabetes?   Taking/obtaining medications;Checking blood sugar;Making healty food and beverage choices;Other (comment)   the cost of medicine, food and diabetes supplies is concerning for him   Self-care barriers Lack of material resources    Self-management support Doctor's office;CDE visits      Pre-Education Assessment   Patient understands the diabetes disease and treatment process. Needs Instruction    Patient understands incorporating nutritional management into lifestyle. Needs Instruction    Patient undertands incorporating physical activity into lifestyle. --   need to assess at future visit   Patient understands using medications safely. Needs Review    Patient understands monitoring blood glucose, interpreting and using results  Needs Review    Patient understands prevention, detection, and treatment of acute complications. Needs Review    Patient understands prevention, detection, and treatment of chronic complications. --   need to assess at future visit   Patient understands how to develop strategies to address psychosocial issues. Comprehends key points    Patient understands how to develop strategies to promote health/change behavior. Comprehends key points      Complications   Last HgB A1C per patient/outside source 11.4 %    How often do you check your blood sugar? 0 times/day (not testing)   needs a lancet   Fasting Blood glucose range (mg/dL) 96-045    Postprandial Blood glucose range (mg/dL) --   need to assess at future visit   Number of hypoglycemic episodes per month 0    Number of hyperglycemic episodes ( >200mg /dL): Daily    Can you tell when your blood sugar is high? Yes    What do you do if your blood sugar is high? eats,drinks water    Have you had a dilated eye exam in the past 12 months? No    Have you had a dental exam in the past 12 months? --   need to assess at future visit   Are you checking your feet? --   need to assess at future visit     Dietary Intake   Breakfast not done in detail today; need to assess at  future visit    Snack (afternoon) fruits    Dinner meat. starch, vegetables    Beverage(s) water, diet drinks      Activity / Exercise   Activity / Exercise Type ADL's;Light (walking / raking leaves)   need to assess at future visit     Patient Education   Previous Diabetes Education Yes (please comment)   hospital and oher diabetes centers   Disease Pathophysiology Definition of diabetes, type 1 and 2, and the diagnosis of diabetes;Factors that contribute to the development of diabetes    Healthy Eating Role of diet in the treatment of diabetes and the relationship between the three main macronutrients and blood glucose level;Plate Method;Reviewed blood glucose goals for pre  and post meals and how to evaluate the patients' food intake on their blood glucose level.    Medications Reviewed patients medication for diabetes, action, purpose, timing of dose and side effects.   dislikes and refuses insulin becaue he states it caused stomach pain and hunger. assisted with copay card for Jardiance   Monitoring Taught/evaluated SMBG meter.;Taught/evaluated CGM (comment)      Individualized Goals (developed by patient)   Nutrition Follow meal plan discussed   discussed lower carb meal plan right now with no insulin and hyperglycemia per patient request   Monitoring  Send in my blood glucose log as discussed      Post-Education Assessment   Patient understands incorporating nutritional management into lifestyle. Comprehends key points      Outcomes   Expected Outcomes Demonstrated interest in learning but significant barriers to change    Future DMSE 4-6 wks    Program Status Not Completed             Individualized Plan for Diabetes Self-Management Training:   Learning Objective:  Patient will have a greater understanding of diabetes self-management. Patient education plan is to attend individual and/or group sessions per assessed needs and concerns.   Plan:   Patient Instructions  Nice to meet you today!  I will request a Contour test strips for you..   I hope the lower carb ideas help.  Can you bring your meter with you to our next visit.   If you have time to download the Dexcom Clarity App- go to settings, under authorize sharing. Type in our access code [facility] [phone]   Expected Outcomes:  Demonstrated interest in learning but significant barriers to change  Education material provided: Meal plan card, My Plate, and Diabetes Resources  If problems or questions, patient to contact team via:  Phone and Email  Future DSME appointment: 3 weeks Norm Parcel, RD 11/22/2022 4:42 PM.

## 2022-11-22 NOTE — Assessment & Plan Note (Addendum)
EF 25% with G2DD and LV global hypokinesis. Denies any SOB at rest, chest pain or leg swelling. Denies any acute concerns. Taking Entresto 97-103 mg BID, Toprol 25 mg daily, spironolactone 25 mg daily, digoxin 0.125 mg daily and Lasix 20 mg PRN. Reports daily adherence. Follows with cardiology with last OV on 6/19. Heart cath on 6/14 showed stable non-obstructive CAD. Exam today showed no signs of volume overload. Mild fluctuations with weight but down from prior OV.   Plan -Continue current regimen above of Entresto, Toprol, spiro, digoxin and Lasix -Will start SGLT-2 (sent Jardiance 10 mg) -Continue cardiology f/u

## 2022-11-22 NOTE — Assessment & Plan Note (Addendum)
BMP done in 6/19 showed potassium of 5.4. Normal potassium prior to that. Was supposed to repeat BMP in a week per cardiology office but did not complete. He is on medications that could increase risk of hyperkalemia including spironolactone and valsartan. Will recheck BMP today.

## 2022-11-22 NOTE — Patient Instructions (Addendum)
Thank you, Mr.Ladamien Bahe for allowing [place] to provide your care today. Today we discussed:  -A1c improved to 11.4% today. You still have room to improve and reach goal. Continue taking metformin 1000 mg twice a day.  -I will check about Jardiance 10 mg once a day to help with your diabetes and heart failure.  -Continue working with Ms. Lupita Leash regarding your diabetes. -Blood pressure good today and I am glad you are feeling well. -Continue with your heart failure medications and following with your cardiologist.  -Will check blood work today for electrolytes like your potassium, kidney function and cholesterol.  -Referral sent for screening colonoscopy.   I have ordered the following labs for you:  Lab Orders         Lipid Profile         BMP8+Anion Gap         POC Hbg A1C      Referrals ordered today:   Referral Orders         Ambulatory referral to Gastroenterology      I have ordered the following medication/changed the following medications:   Stop the following medications: There are no discontinued medications.   Start the following medications: Meds ordered this encounter  Medications   empagliflozin (JARDIANCE) 10 MG TABS tablet    Sig: Take 1 tablet (10 mg total) by mouth daily before breakfast.    Dispense:  30 tablet    Refill:  2     Follow up: 3 months   Should you have any questions or concerns please call the internal medicine clinic at [phone].    Rana Snare, D.O. [facility]

## 2022-11-22 NOTE — Assessment & Plan Note (Addendum)
BP controlled today at 135/66 with Entresto 97-103 mg BID, Toprol 25 mg, spironolactone 25 mg and Lasix 20 mg PRN. No acute concerns today. Plan to add on SGLT-2 for HFrEF and T2DM.   Plan -Continue current regimen -Start Jardiance 10 mg daily

## 2022-11-22 NOTE — Progress Notes (Signed)
CC: T2DM f/u  HPI:  [name] is a 62 y.o. male living with a history stated below and presents today for T2DM f/u. Please see problem based assessment and plan for additional details.  Past Medical History:  Diagnosis Date   Anemia    Diabetes mellitus without complication (HCC) 1987   Family history of breast cancer    Hypertension    Myocardial infarction [facility])    Prostate cancer [facility])     Current Outpatient Medications on File Prior to Visit  Medication Sig Dispense Refill   Accu-Chek Softclix Lancets lancets SMARTSIG:Topical 1-4 Times Daily 100 each 0   aspirin EC 81 MG tablet Take 1 tablet (81 mg total) by mouth daily. Swallow whole. 90 tablet 3   atorvastatin (LIPITOR) 40 MG tablet TAKE 1 TABLET BY MOUTH EVERY DAY 90 tablet 1   blood glucose meter kit and supplies KIT Dispense based on patient and insurance preference. Use up to four times daily as directed. (FOR ICD-9 250.00, 250.01). 1 each 0   Continuous Glucose Receiver (DEXCOM G7 RECEIVER) DEVI Use as directed 1 each 0   digoxin (LANOXIN) 0.125 MG tablet Take 1 tablet (0.125 mg total) by mouth daily. 90 tablet 3   ferrous sulfate 325 (65 FE) MG tablet TAKE 1 TABLET BY MOUTH EVERY DAY 90 tablet 1   furosemide (LASIX) 20 MG tablet Take 1 tablet (20 mg total) by mouth daily. 30 tablet 11   gabapentin (NEURONTIN) 300 MG capsule TAKE 1 CAPSULE BY MOUTH TWICE A DAY 60 capsule 0   insulin glargine (LANTUS SOLOSTAR) 100 UNIT/ML Solostar Pen INJECT 16 UNITS INTO THE SKIN AT BEDTIME. 15 mL 0   metFORMIN (GLUCOPHAGE) 1000 MG tablet TAKE 1 TABLET (1,000 MG TOTAL) BY MOUTH TWICE A DAY WITH FOOD 180 tablet 1   metoprolol succinate (TOPROL-XL) 25 MG 24 hr tablet TAKE 1 TABLET BY MOUTH DAILY 90 tablet 1   Multiple Vitamin (MULTIVITAMIN) tablet Take 1 tablet by mouth daily.     sacubitril-valsartan (ENTRESTO) 97-103 MG Take 1 tablet by mouth 2 (two) times daily. 180 tablet 3   spironolactone (ALDACTONE) 25 MG tablet Take 1  tablet (25 mg total) by mouth daily. 90 tablet 3   tamsulosin (FLOMAX) 0.4 MG CAPS capsule TAKE 1 CAPSULE BY MOUTH EVERYDAY AT BEDTIME 90 capsule 1   No current facility-administered medications on file prior to visit.   Review of Systems: ROS negative except for what is noted on the assessment and plan.  Vitals:   11/22/22 1327  BP: 135/66  Pulse: 92  Temp: 98.8 F (37.1 C)  TempSrc: Oral  SpO2: 100%  Weight: 178 lb 12.8 oz (81.1 kg)  Height: 6' (1.829 m)   Physical Exam: Constitutional: well-appearing male sitting in chair comfortably, in no acute distress Cardiovascular: regular rate and rhythm, palpable DP pulse bilaterally Pulmonary/Chest: non-labored, on room air, lungs clear to auscultation bilaterally  MSK: no LE edema Neurological: alert & oriented x 3 Skin: warm and dry, no open wounds over feet Psych: pleasant mood  Assessment & Plan:   Chronic systolic congestive heart failure (HCC) EF 25% with G2DD and LV global hypokinesis. Denies any SOB at rest, chest pain or leg swelling. Denies any acute concerns. Taking Entresto 97-103 mg BID, Toprol 25 mg daily, spironolactone 25 mg daily, digoxin 0.125 mg daily and Lasix 20 mg PRN. Reports daily adherence. Follows with cardiology with last OV on 6/19. Heart cath on 6/14 showed stable non-obstructive CAD. Exam today  showed no signs of volume overload. Mild fluctuations with weight but down from prior OV.   Plan -Continue current regimen above of Entresto, Toprol, spiro, digoxin and Lasix -Will start SGLT-2 (sent Jardiance 10 mg) -Continue cardiology f/u   Hypertension BP controlled today at 135/66 with Entresto 97-103 mg BID, Toprol 25 mg, spironolactone 25 mg and Lasix 20 mg PRN. No acute concerns today. Plan to add on SGLT-2 for HFrEF and T2DM.   Plan -Continue current regimen -Start Jardiance 10 mg daily   CAD (coronary artery disease) Denies any chest pain and no other acute concerns. Last LDL above goal at 140  due to being off medications. Taking atorvastatin 40 mg daily and ASA 81 mg daily. Reports daily adherence with his medications.   Plan -Continue atorvastatin and ASA -Lipid panel today  Type 2 diabetes mellitus with complication, without long-term current use of insulin (HCC) A1c improved to 11.4 from 13.6. Taking metformin 1000 mg BID, tolerating well and reports adherence. Not taking Lantus due to side effects, reports fatigue, generalized pain and hunger with initial few doses. Discussed starting SGLT-2 which patient agrees to, Rx for [employer] sent pending insurance. Previously prescribed Marcelline Deist but unable to obtain due to costs/insurance. No hx of DKA. Denies any acute concerns today.   Plan -Stop Lantus -Continue metformin -Start Jardiance 10 mg daily  -Repeat A1c in 3 months -F/u with Lupita Leash   Healthcare maintenance Agrees to referral to GI for screening colonoscopy.   Hyperkalemia BMP done in 6/19 showed potassium of 5.4. Normal potassium prior to that. Was supposed to repeat BMP in a week per cardiology office but did not complete. He is on medications that could increase risk of hyperkalemia including spironolactone and valsartan. Will recheck BMP today.    Patient discussed with Dr. Halina Andreas, D.O. [facility], PGY-2 Phone: [phone] Date 11/22/2022 Time 6:23 PM

## 2022-11-22 NOTE — Telephone Encounter (Signed)
Request test strips for new meter provided to patient today.

## 2022-11-23 LAB — BMP8+ANION GAP
Anion Gap: 16 mmol/L (ref 10.0–18.0)
BUN/Creatinine Ratio: 24 (ref 10–24)
BUN: 24 mg/dL (ref 8–27)
CO2: 22 mmol/L (ref 20–29)
Calcium: 9.5 mg/dL (ref 8.6–10.2)
Chloride: 97 mmol/L (ref 96–106)
Creatinine, Ser: 0.99 mg/dL (ref 0.76–1.27)
Glucose: 419 mg/dL — ABNORMAL HIGH (ref 70–99)
Potassium: 5.1 mmol/L (ref 3.5–5.2)
Sodium: 135 mmol/L (ref 134–144)
eGFR: 86 mL/min/{1.73_m2} (ref >59)

## 2022-11-23 LAB — LIPID PANEL
Chol/HDL Ratio: 5 ratio (ref 0.0–5.0)
Cholesterol, Total: 200 mg/dL — ABNORMAL HIGH (ref 100–199)
HDL: 40 mg/dL (ref >39)
LDL Chol Calc (NIH): 109 mg/dL — ABNORMAL HIGH (ref 0–99)
Triglycerides: 299 mg/dL — ABNORMAL HIGH (ref 0–149)
VLDL Cholesterol Cal: 51 mg/dL — ABNORMAL HIGH (ref 5–40)

## 2022-11-24 NOTE — Progress Notes (Signed)
Internal Medicine Clinic Attending  Case discussed with the resident at the time of the visit.  We reviewed the resident's history and exam and pertinent patient test results.  I agree with the assessment, diagnosis, and plan of care documented in the resident's note.  

## 2022-11-25 MED ORDER — EZETIMIBE 10 MG PO TABS: 10.0000 mg | ORAL_TABLET | Freq: Every day | ORAL | 11 refills | Status: DC

## 2022-11-25 NOTE — Addendum Note (Signed)
Addended by: Rana Snare on: 11/25/2022 12:11 PM   Modules accepted: Orders

## 2022-12-13 ENCOUNTER — Encounter: Payer: Self-pay | Admitting: Dietician

## 2022-12-30 ENCOUNTER — Telehealth (HOSPITAL_COMMUNITY): Payer: Self-pay | Admitting: *Deleted

## 2022-12-30 NOTE — Telephone Encounter (Signed)
 Attempted to call patient regarding upcoming cardiac MRI appointment. Left message on voicemail with name and callback number Johney Frame RN Navigator Cardiac Imaging [facility] [phone] Office

## 2023-01-03 ENCOUNTER — Ambulatory Visit (HOSPITAL_COMMUNITY): Admission: RE | Admit: 2023-01-03 | Payer: BC Managed Care – PPO | Source: Ambulatory Visit

## 2023-06-13 ENCOUNTER — Inpatient Hospital Stay (HOSPITAL_COMMUNITY)
Admission: EM | Admit: 2023-06-13 | Discharge: 2023-06-29 | DRG: 853 | Disposition: A | Discharge status: Skilled Nursing Facility | Payer: BC Managed Care – PPO | Attending: Internal Medicine | Admitting: Internal Medicine

## 2023-06-13 ENCOUNTER — Other Ambulatory Visit: Payer: Self-pay

## 2023-06-13 ENCOUNTER — Encounter (HOSPITAL_COMMUNITY): Payer: Self-pay

## 2023-06-13 ENCOUNTER — Emergency Department (HOSPITAL_COMMUNITY): Payer: BC Managed Care – PPO

## 2023-06-13 DIAGNOSIS — R931 Abnormal findings on diagnostic imaging of heart and coronary circulation: Secondary | ICD-10-CM

## 2023-06-13 DIAGNOSIS — Z91199 Patient's noncompliance with other medical treatment and regimen due to unspecified reason: Secondary | ICD-10-CM | POA: Diagnosis not present

## 2023-06-13 DIAGNOSIS — L97519 Non-pressure chronic ulcer of other part of right foot with unspecified severity: Secondary | ICD-10-CM | POA: Diagnosis present

## 2023-06-13 DIAGNOSIS — I110 Hypertensive heart disease with heart failure: Secondary | ICD-10-CM | POA: Diagnosis present

## 2023-06-13 DIAGNOSIS — I5042 Chronic combined systolic (congestive) and diastolic (congestive) heart failure: Secondary | ICD-10-CM | POA: Diagnosis not present

## 2023-06-13 DIAGNOSIS — Z79899 Other long term (current) drug therapy: Secondary | ICD-10-CM

## 2023-06-13 DIAGNOSIS — D638 Anemia in other chronic diseases classified elsewhere: Secondary | ICD-10-CM | POA: Diagnosis present

## 2023-06-13 DIAGNOSIS — E46 Unspecified protein-calorie malnutrition: Secondary | ICD-10-CM | POA: Diagnosis present

## 2023-06-13 DIAGNOSIS — E11621 Type 2 diabetes mellitus with foot ulcer: Secondary | ICD-10-CM | POA: Diagnosis present

## 2023-06-13 DIAGNOSIS — T383X6A Underdosing of insulin and oral hypoglycemic [antidiabetic] drugs, initial encounter: Secondary | ICD-10-CM | POA: Diagnosis present

## 2023-06-13 DIAGNOSIS — I2510 Atherosclerotic heart disease of native coronary artery without angina pectoris: Secondary | ICD-10-CM | POA: Diagnosis present

## 2023-06-13 DIAGNOSIS — M86671 Other chronic osteomyelitis, right ankle and foot: Secondary | ICD-10-CM | POA: Diagnosis present

## 2023-06-13 DIAGNOSIS — E782 Mixed hyperlipidemia: Secondary | ICD-10-CM | POA: Diagnosis not present

## 2023-06-13 DIAGNOSIS — E11628 Type 2 diabetes mellitus with other skin complications: Secondary | ICD-10-CM | POA: Diagnosis present

## 2023-06-13 DIAGNOSIS — E860 Dehydration: Secondary | ICD-10-CM | POA: Diagnosis present

## 2023-06-13 DIAGNOSIS — L97509 Non-pressure chronic ulcer of other part of unspecified foot with unspecified severity: Secondary | ICD-10-CM | POA: Diagnosis not present

## 2023-06-13 DIAGNOSIS — G9341 Metabolic encephalopathy: Secondary | ICD-10-CM | POA: Diagnosis present

## 2023-06-13 DIAGNOSIS — R1310 Dysphagia, unspecified: Secondary | ICD-10-CM | POA: Diagnosis present

## 2023-06-13 DIAGNOSIS — R6510 Systemic inflammatory response syndrome (SIRS) of non-infectious origin without acute organ dysfunction: Secondary | ICD-10-CM

## 2023-06-13 DIAGNOSIS — I513 Intracardiac thrombosis, not elsewhere classified: Secondary | ICD-10-CM | POA: Diagnosis present

## 2023-06-13 DIAGNOSIS — E1169 Type 2 diabetes mellitus with other specified complication: Secondary | ICD-10-CM | POA: Diagnosis present

## 2023-06-13 DIAGNOSIS — Z01810 Encounter for preprocedural cardiovascular examination: Secondary | ICD-10-CM | POA: Diagnosis not present

## 2023-06-13 DIAGNOSIS — I340 Nonrheumatic mitral (valve) insufficiency: Secondary | ICD-10-CM | POA: Diagnosis present

## 2023-06-13 DIAGNOSIS — I70261 Atherosclerosis of native arteries of extremities with gangrene, right leg: Secondary | ICD-10-CM | POA: Diagnosis present

## 2023-06-13 DIAGNOSIS — I5023 Acute on chronic systolic (congestive) heart failure: Secondary | ICD-10-CM | POA: Diagnosis present

## 2023-06-13 DIAGNOSIS — E1100 Type 2 diabetes mellitus with hyperosmolarity without nonketotic hyperglycemic-hyperosmolar coma (NKHHC): Secondary | ICD-10-CM | POA: Diagnosis present

## 2023-06-13 DIAGNOSIS — I5022 Chronic systolic (congestive) heart failure: Secondary | ICD-10-CM | POA: Diagnosis present

## 2023-06-13 DIAGNOSIS — A4101 Sepsis due to Methicillin susceptible Staphylococcus aureus: Secondary | ICD-10-CM | POA: Diagnosis present

## 2023-06-13 DIAGNOSIS — Z923 Personal history of irradiation: Secondary | ICD-10-CM

## 2023-06-13 DIAGNOSIS — E1152 Type 2 diabetes mellitus with diabetic peripheral angiopathy with gangrene: Secondary | ICD-10-CM | POA: Diagnosis present

## 2023-06-13 DIAGNOSIS — I5021 Acute systolic (congestive) heart failure: Principal | ICD-10-CM

## 2023-06-13 DIAGNOSIS — E119 Type 2 diabetes mellitus without complications: Secondary | ICD-10-CM | POA: Diagnosis not present

## 2023-06-13 DIAGNOSIS — R3589 Other polyuria: Secondary | ICD-10-CM | POA: Diagnosis present

## 2023-06-13 DIAGNOSIS — R29703 NIHSS score 3: Secondary | ICD-10-CM | POA: Diagnosis present

## 2023-06-13 DIAGNOSIS — R570 Cardiogenic shock: Secondary | ICD-10-CM | POA: Diagnosis present

## 2023-06-13 DIAGNOSIS — I4729 Other ventricular tachycardia: Secondary | ICD-10-CM | POA: Diagnosis not present

## 2023-06-13 DIAGNOSIS — R0989 Other specified symptoms and signs involving the circulatory and respiratory systems: Secondary | ICD-10-CM | POA: Diagnosis not present

## 2023-06-13 DIAGNOSIS — I6340 Cerebral infarction due to embolism of unspecified cerebral artery: Secondary | ICD-10-CM | POA: Diagnosis present

## 2023-06-13 DIAGNOSIS — I447 Left bundle-branch block, unspecified: Secondary | ICD-10-CM | POA: Diagnosis present

## 2023-06-13 DIAGNOSIS — I471 Supraventricular tachycardia, unspecified: Secondary | ICD-10-CM | POA: Diagnosis not present

## 2023-06-13 DIAGNOSIS — E8721 Acute metabolic acidosis: Secondary | ICD-10-CM | POA: Diagnosis not present

## 2023-06-13 DIAGNOSIS — L02611 Cutaneous abscess of right foot: Secondary | ICD-10-CM | POA: Diagnosis present

## 2023-06-13 DIAGNOSIS — R197 Diarrhea, unspecified: Secondary | ICD-10-CM | POA: Diagnosis not present

## 2023-06-13 DIAGNOSIS — R531 Weakness: Secondary | ICD-10-CM

## 2023-06-13 DIAGNOSIS — N179 Acute kidney failure, unspecified: Secondary | ICD-10-CM | POA: Diagnosis present

## 2023-06-13 DIAGNOSIS — I240 Acute coronary thrombosis not resulting in myocardial infarction: Secondary | ICD-10-CM

## 2023-06-13 DIAGNOSIS — Z794 Long term (current) use of insulin: Secondary | ICD-10-CM | POA: Diagnosis not present

## 2023-06-13 DIAGNOSIS — Z1152 Encounter for screening for COVID-19: Secondary | ICD-10-CM | POA: Diagnosis not present

## 2023-06-13 DIAGNOSIS — Z7982 Long term (current) use of aspirin: Secondary | ICD-10-CM

## 2023-06-13 DIAGNOSIS — D6959 Other secondary thrombocytopenia: Secondary | ICD-10-CM | POA: Diagnosis not present

## 2023-06-13 DIAGNOSIS — M869 Osteomyelitis, unspecified: Secondary | ICD-10-CM

## 2023-06-13 DIAGNOSIS — E872 Acidosis, unspecified: Secondary | ICD-10-CM

## 2023-06-13 DIAGNOSIS — I10 Essential (primary) hypertension: Secondary | ICD-10-CM

## 2023-06-13 DIAGNOSIS — E874 Mixed disorder of acid-base balance: Secondary | ICD-10-CM | POA: Diagnosis present

## 2023-06-13 DIAGNOSIS — R64 Cachexia: Secondary | ICD-10-CM | POA: Diagnosis present

## 2023-06-13 DIAGNOSIS — E876 Hypokalemia: Secondary | ICD-10-CM | POA: Diagnosis not present

## 2023-06-13 DIAGNOSIS — Z6823 Body mass index (BMI) 23.0-23.9, adult: Secondary | ICD-10-CM

## 2023-06-13 DIAGNOSIS — Z87891 Personal history of nicotine dependence: Secondary | ICD-10-CM

## 2023-06-13 DIAGNOSIS — D649 Anemia, unspecified: Secondary | ICD-10-CM | POA: Diagnosis not present

## 2023-06-13 DIAGNOSIS — J00 Acute nasopharyngitis [common cold]: Secondary | ICD-10-CM | POA: Diagnosis present

## 2023-06-13 DIAGNOSIS — N400 Enlarged prostate without lower urinary tract symptoms: Secondary | ICD-10-CM | POA: Diagnosis present

## 2023-06-13 DIAGNOSIS — I472 Ventricular tachycardia, unspecified: Secondary | ICD-10-CM | POA: Diagnosis not present

## 2023-06-13 DIAGNOSIS — I63421 Cerebral infarction due to embolism of right anterior cerebral artery: Secondary | ICD-10-CM | POA: Diagnosis not present

## 2023-06-13 DIAGNOSIS — I96 Gangrene, not elsewhere classified: Secondary | ICD-10-CM | POA: Diagnosis not present

## 2023-06-13 DIAGNOSIS — E0781 Sick-euthyroid syndrome: Secondary | ICD-10-CM | POA: Diagnosis present

## 2023-06-13 DIAGNOSIS — I429 Cardiomyopathy, unspecified: Secondary | ICD-10-CM | POA: Diagnosis present

## 2023-06-13 DIAGNOSIS — I6389 Other cerebral infarction: Secondary | ICD-10-CM | POA: Diagnosis not present

## 2023-06-13 DIAGNOSIS — E871 Hypo-osmolality and hyponatremia: Secondary | ICD-10-CM | POA: Diagnosis not present

## 2023-06-13 DIAGNOSIS — Z7984 Long term (current) use of oral hypoglycemic drugs: Secondary | ICD-10-CM

## 2023-06-13 DIAGNOSIS — I5082 Biventricular heart failure: Secondary | ICD-10-CM | POA: Diagnosis present

## 2023-06-13 DIAGNOSIS — E8729 Other acidosis: Secondary | ICD-10-CM

## 2023-06-13 DIAGNOSIS — Z803 Family history of malignant neoplasm of breast: Secondary | ICD-10-CM

## 2023-06-13 DIAGNOSIS — Z8546 Personal history of malignant neoplasm of prostate: Secondary | ICD-10-CM

## 2023-06-13 DIAGNOSIS — R6521 Severe sepsis with septic shock: Secondary | ICD-10-CM | POA: Diagnosis present

## 2023-06-13 DIAGNOSIS — D6489 Other specified anemias: Secondary | ICD-10-CM | POA: Diagnosis present

## 2023-06-13 DIAGNOSIS — E1165 Type 2 diabetes mellitus with hyperglycemia: Secondary | ICD-10-CM | POA: Diagnosis present

## 2023-06-13 DIAGNOSIS — A419 Sepsis, unspecified organism: Secondary | ICD-10-CM

## 2023-06-13 DIAGNOSIS — I639 Cerebral infarction, unspecified: Secondary | ICD-10-CM | POA: Diagnosis not present

## 2023-06-13 DIAGNOSIS — I5043 Acute on chronic combined systolic (congestive) and diastolic (congestive) heart failure: Secondary | ICD-10-CM | POA: Diagnosis present

## 2023-06-13 DIAGNOSIS — I252 Old myocardial infarction: Secondary | ICD-10-CM

## 2023-06-13 DIAGNOSIS — Z8042 Family history of malignant neoplasm of prostate: Secondary | ICD-10-CM

## 2023-06-13 DIAGNOSIS — Z7901 Long term (current) use of anticoagulants: Secondary | ICD-10-CM

## 2023-06-13 DIAGNOSIS — Z8249 Family history of ischemic heart disease and other diseases of the circulatory system: Secondary | ICD-10-CM

## 2023-06-13 DIAGNOSIS — R739 Hyperglycemia, unspecified: Secondary | ICD-10-CM

## 2023-06-13 DIAGNOSIS — R4182 Altered mental status, unspecified: Secondary | ICD-10-CM

## 2023-06-13 LAB — I-STAT CHEM 8, ED
BUN: 21 mg/dL (ref 8–23)
Calcium, Ion: 0.97 mmol/L — ABNORMAL LOW (ref 1.15–1.40)
Chloride: 95 mmol/L — ABNORMAL LOW (ref 98–111)
Creatinine, Ser: 1.9 mg/dL — ABNORMAL HIGH (ref 0.61–1.24)
Glucose, Bld: 564 mg/dL (ref 70–99)
HCT: 30 % — ABNORMAL LOW (ref 39.0–52.0)
Hemoglobin: 10.2 g/dL — ABNORMAL LOW (ref 13.0–17.0)
Potassium: 4.1 mmol/L (ref 3.5–5.1)
Sodium: 127 mmol/L — ABNORMAL LOW (ref 135–145)
TCO2: 20 mmol/L — ABNORMAL LOW (ref 22–32)

## 2023-06-13 LAB — I-STAT CG4 LACTIC ACID, ED
Lactic Acid, Venous: 2.6 mmol/L (ref 0.5–1.9)
Lactic Acid, Venous: 2.3 mmol/L (ref 0.5–1.9)

## 2023-06-13 LAB — BASIC METABOLIC PANEL
Anion gap: 11 (ref 5–15)
BUN: 20 mg/dL (ref 8–23)
CO2: 19 mmol/L — ABNORMAL LOW (ref 22–32)
Calcium: 7.7 mg/dL — ABNORMAL LOW (ref 8.9–10.3)
Chloride: 97 mmol/L — ABNORMAL LOW (ref 98–111)
Creatinine, Ser: 1.8 mg/dL — ABNORMAL HIGH (ref 0.61–1.24)
GFR, Estimated: 42 mL/min — ABNORMAL LOW (ref >60)
Glucose, Bld: 479 mg/dL — ABNORMAL HIGH (ref 70–99)
Potassium: 3.8 mmol/L (ref 3.5–5.1)
Sodium: 127 mmol/L — ABNORMAL LOW (ref 135–145)

## 2023-06-13 LAB — COMPREHENSIVE METABOLIC PANEL
ALT: 27 U/L (ref 0–44)
AST: 29 U/L (ref 15–41)
Albumin: 2.1 g/dL — ABNORMAL LOW (ref 3.5–5.0)
Alkaline Phosphatase: 49 U/L (ref 38–126)
Anion gap: 16 — ABNORMAL HIGH (ref 5–15)
BUN: 19 mg/dL (ref 8–23)
CO2: 19 mmol/L — ABNORMAL LOW (ref 22–32)
Calcium: 8.5 mg/dL — ABNORMAL LOW (ref 8.9–10.3)
Chloride: 91 mmol/L — ABNORMAL LOW (ref 98–111)
Creatinine, Ser: 1.98 mg/dL — ABNORMAL HIGH (ref 0.61–1.24)
GFR, Estimated: 37 mL/min — ABNORMAL LOW (ref >60)
Glucose, Bld: 545 mg/dL (ref 70–99)
Potassium: 4.1 mmol/L (ref 3.5–5.1)
Sodium: 126 mmol/L — ABNORMAL LOW (ref 135–145)
Total Bilirubin: 0.7 mg/dL (ref 0.0–1.2)
Total Protein: 6.7 g/dL (ref 6.5–8.1)

## 2023-06-13 LAB — I-STAT VENOUS BLOOD GAS, ED
Acid-base deficit: 2 mmol/L (ref 0.0–2.0)
Bicarbonate: 21.1 mmol/L (ref 20.0–28.0)
Calcium, Ion: 0.98 mmol/L — ABNORMAL LOW (ref 1.15–1.40)
HCT: 30 % — ABNORMAL LOW (ref 39.0–52.0)
Hemoglobin: 10.2 g/dL — ABNORMAL LOW (ref 13.0–17.0)
O2 Saturation: 84 %
Potassium: 4 mmol/L (ref 3.5–5.1)
Sodium: 128 mmol/L — ABNORMAL LOW (ref 135–145)
TCO2: 22 mmol/L (ref 22–32)
pCO2, Ven: 27.9 mm[Hg] — ABNORMAL LOW (ref 44–60)
pH, Ven: 7.486 — ABNORMAL HIGH (ref 7.25–7.43)
pO2, Ven: 44 mm[Hg] (ref 32–45)

## 2023-06-13 LAB — CBC WITH DIFFERENTIAL/PLATELET
Abs Immature Granulocytes: 0.24 10*3/uL — ABNORMAL HIGH (ref 0.00–0.07)
Basophils Absolute: 0 10*3/uL (ref 0.0–0.1)
Basophils Relative: 0 %
Eosinophils Absolute: 0 10*3/uL (ref 0.0–0.5)
Eosinophils Relative: 0 %
HCT: 27.9 % — ABNORMAL LOW (ref 39.0–52.0)
Hemoglobin: 8.9 g/dL — ABNORMAL LOW (ref 13.0–17.0)
Immature Granulocytes: 1 %
Lymphocytes Relative: 3 %
Lymphs Abs: 0.6 10*3/uL — ABNORMAL LOW (ref 0.7–4.0)
MCH: 27.9 pg (ref 26.0–34.0)
MCHC: 31.9 g/dL (ref 30.0–36.0)
MCV: 87.5 fL (ref 80.0–100.0)
Monocytes Absolute: 0.5 10*3/uL (ref 0.1–1.0)
Monocytes Relative: 2 %
Neutro Abs: 20.5 10*3/uL — ABNORMAL HIGH (ref 1.7–7.7)
Neutrophils Relative %: 94 %
Platelets: 195 10*3/uL (ref 150–400)
RBC: 3.19 MIL/uL — ABNORMAL LOW (ref 4.22–5.81)
RDW: 16.8 % — ABNORMAL HIGH (ref 11.5–15.5)
WBC: 21.8 10*3/uL — ABNORMAL HIGH (ref 4.0–10.5)
nRBC: 0 % (ref 0.0–0.2)

## 2023-06-13 LAB — CBG MONITORING, ED
Glucose-Capillary: 548 mg/dL (ref 70–99)
Glucose-Capillary: 462 mg/dL — ABNORMAL HIGH (ref 70–99)
Glucose-Capillary: 347 mg/dL — ABNORMAL HIGH (ref 70–99)
Glucose-Capillary: 409 mg/dL — ABNORMAL HIGH (ref 70–99)

## 2023-06-13 LAB — TYPE AND SCREEN
ABO/RH(D): B POS
Antibody Screen: NEGATIVE

## 2023-06-13 LAB — TSH: TSH: 5.642 u[IU]/mL — ABNORMAL HIGH (ref 0.350–4.500)

## 2023-06-13 LAB — AMMONIA: Ammonia: 23 umol/L (ref 9–35)

## 2023-06-13 LAB — MAGNESIUM: Magnesium: 1.5 mg/dL — ABNORMAL LOW (ref 1.7–2.4)

## 2023-06-13 LAB — ABO/RH: ABO/RH(D): B POS

## 2023-06-13 LAB — OSMOLALITY: Osmolality: 302 mosm/kg — ABNORMAL HIGH (ref 275–295)

## 2023-06-13 LAB — RESP PANEL BY RT-PCR (RSV, FLU A&B, COVID)  RVPGX2
Influenza A by PCR: NEGATIVE
Influenza B by PCR: NEGATIVE
Resp Syncytial Virus by PCR: NEGATIVE
SARS Coronavirus 2 by RT PCR: NEGATIVE

## 2023-06-13 LAB — BRAIN NATRIURETIC PEPTIDE: B Natriuretic Peptide: >4500 pg/mL — ABNORMAL HIGH (ref 0.0–100.0)

## 2023-06-13 LAB — LIPASE, BLOOD: Lipase: 18 U/L (ref 11–51)

## 2023-06-13 LAB — BETA-HYDROXYBUTYRIC ACID: Beta-Hydroxybutyric Acid: 1.04 mmol/L — ABNORMAL HIGH (ref 0.05–0.27)

## 2023-06-13 LAB — HEMOGLOBIN A1C
Hgb A1c MFr Bld: 13.3 % — ABNORMAL HIGH (ref 4.8–5.6)
Mean Plasma Glucose: 335.01 mg/dL

## 2023-06-13 LAB — ETHANOL: Alcohol, Ethyl (B): <10 mg/dL (ref <10)

## 2023-06-13 MED ORDER — ACETAMINOPHEN 650 MG RE SUPP: 650.0000 mg | Freq: Four times a day (QID) | RECTAL | Status: DC | PRN

## 2023-06-13 MED ORDER — POTASSIUM CHLORIDE 10 MEQ/100ML IV SOLN
10.0000 meq | INTRAVENOUS | Status: AC
  Administered 2023-06-13: 10 meq via INTRAVENOUS
  Filled 2023-06-13 (×2): qty 100

## 2023-06-13 MED ORDER — ACETAMINOPHEN 325 MG PO TABS
650.0000 mg | ORAL_TABLET | Freq: Four times a day (QID) | ORAL | Status: DC | PRN
  Administered 2023-06-16 – 2023-06-17 (×3): 650 mg via ORAL
  Filled 2023-06-13 (×3): qty 2

## 2023-06-13 MED ORDER — LACTATED RINGERS IV SOLN: INTRAVENOUS | Status: DC

## 2023-06-13 MED ORDER — LACTATED RINGERS IV BOLUS
20.0000 mL/kg | Freq: Once | INTRAVENOUS | Status: AC
  Administered 2023-06-13: 1614 mL via INTRAVENOUS

## 2023-06-13 MED ORDER — SODIUM CHLORIDE 0.9 % IV BOLUS
1000.0000 mL | Freq: Once | INTRAVENOUS | Status: AC
  Administered 2023-06-13: 1000 mL via INTRAVENOUS

## 2023-06-13 MED ORDER — MELATONIN 3 MG PO TABS: 3.0000 mg | ORAL_TABLET | Freq: Every evening | ORAL | Status: DC | PRN

## 2023-06-13 MED ORDER — INSULIN REGULAR(HUMAN) IN NACL 100-0.9 UT/100ML-% IV SOLN
INTRAVENOUS | Status: DC
  Administered 2023-06-13: 12 [IU]/h via INTRAVENOUS
  Filled 2023-06-13: qty 100

## 2023-06-13 MED ORDER — ONDANSETRON HCL 4 MG/2ML IJ SOLN
4.0000 mg | Freq: Four times a day (QID) | INTRAMUSCULAR | Status: DC | PRN
  Administered 2023-06-23: 4 mg via INTRAVENOUS
  Filled 2023-06-13 (×2): qty 2

## 2023-06-13 MED ORDER — DEXTROSE IN LACTATED RINGERS 5 % IV SOLN: INTRAVENOUS | Status: DC

## 2023-06-13 MED ORDER — DEXTROSE 50 % IV SOLN: 0.0000 mL | INTRAVENOUS | Status: DC | PRN

## 2023-06-13 NOTE — ED Provider Notes (Signed)
[place] [facility]ERGENCY DEPARTMENT AT [facility] Provider Note   CSN: [id number] Arrival date & time: 06/13/23  1842     History  Chief Complaint  Patient presents with   Hyperglycemia   Weakness    Derek Thompson is a 63 y.o. male.  The history is provided by the patient and medical records.  Hyperglycemia Blood sugar level PTA:  >500 Severity:  Severe Onset quality:  Unable to specify Timing:  Constant Progression:  Unchanged Diabetes status:  Unable to specify Associated symptoms: altered mental status, confusion, dizziness, fatigue, fever, increased thirst, nausea and weakness   Associated symptoms: no abdominal pain, no chest pain, no diaphoresis, no dysuria and no shortness of breath   Weakness Associated symptoms: cough, diarrhea, dizziness, fever and nausea   Associated symptoms: no abdominal pain, no chest pain, no dysuria, no headaches, no seizures and no shortness of breath        Home Medications Prior to Admission medications   Medication Sig Start Date End Date Taking? Authorizing Provider  Accu-Chek Softclix Lancets lancets SMARTSIG:Topical 1-4 Times Daily 08/20/22   Atway, Rayann N, DO  aspirin EC 81 MG tablet Take 1 tablet (81 mg total) by mouth daily. Swallow whole. 09/07/22 09/07/23  Robbie Lis M, PA-C  atorvastatin (LIPITOR) 40 MG tablet TAKE 1 TABLET BY MOUTH EVERY DAY 09/12/22   Lyndle Herrlich, MD  blood glucose meter kit and supplies KIT Dispense based on patient and insurance preference. Use up to four times daily as directed. (FOR ICD-9 250.00, 250.01). 08/20/22   Atway, Rayann N, DO  Continuous Glucose Receiver (DEXCOM G7 RECEIVER) DEVI Use as directed 08/27/22   Lyndle Herrlich, MD  digoxin (LANOXIN) 0.125 MG tablet Take 1 tablet (0.125 mg total) by mouth daily. 09/07/22   Robbie Lis M, PA-C  empagliflozin (JARDIANCE) 10 MG TABS tablet Take 1 tablet (10 mg total) by mouth daily before breakfast. 11/22/22   Rana Snare, DO  ezetimibe (ZETIA) 10 MG tablet Take 1 tablet (10 mg total) by mouth daily. 11/25/22   Rana Snare, DO  ferrous sulfate 325 (65 FE) MG tablet TAKE 1 TABLET BY MOUTH EVERY DAY 09/20/22   Lyndle Herrlich, MD  furosemide (LASIX) 20 MG tablet Take 1 tablet (20 mg total) by mouth daily. 10/05/22 10/05/23  Bensimhon, Bevelyn Buckles, MD  gabapentin (NEURONTIN) 300 MG capsule TAKE 1 CAPSULE BY MOUTH TWICE A DAY 09/29/22   Lyndle Herrlich, MD  glucose blood (CONTOUR NEXT TEST) test strip Use to check blood sugar up to 3 times daily as instructed 11/22/22   Rana Snare, DO  insulin glargine (LANTUS SOLOSTAR) 100 UNIT/ML Solostar Pen INJECT 16 UNITS INTO THE SKIN AT BEDTIME. 10/10/22   Lyndle Herrlich, MD  metFORMIN (GLUCOPHAGE) 1000 MG tablet TAKE 1 TABLET (1,000 MG TOTAL) BY MOUTH TWICE A DAY WITH FOOD 10/28/22   Bensimhon, Bevelyn Buckles, MD  metoprolol succinate (TOPROL-XL) 25 MG 24 hr tablet TAKE 1 TABLET BY MOUTH DAILY 09/12/22   Lyndle Herrlich, MD  Multiple Vitamin (MULTIVITAMIN) tablet Take 1 tablet by mouth daily.    [provider]  sacubitril-valsartan (ENTRESTO) 97-103 MG Take 1 tablet by mouth 2 (two) times daily. 09/20/22   Bensimhon, Bevelyn Buckles, MD  spironolactone (ALDACTONE) 25 MG tablet Take 1 tablet (25 mg total) by mouth daily. 10/05/22   Bensimhon, Bevelyn Buckles, MD  tamsulosin (FLOMAX) 0.4 MG CAPS capsule TAKE 1 CAPSULE BY MOUTH EVERYDAY AT BEDTIME 09/12/22   Lyndle Herrlich, MD  Allergies    Patient has no known allergies.    Review of Systems   Review of Systems  Constitutional:  Positive for chills, fatigue and fever. Negative for diaphoresis.  HENT:  Negative for congestion.   Respiratory:  Positive for cough. Negative for chest tightness, shortness of breath and wheezing.   Cardiovascular:  Negative for chest pain and palpitations.  Gastrointestinal:  Positive for diarrhea and nausea. Negative for abdominal pain and constipation.  Endocrine:  Positive for polydipsia.  Genitourinary:  Negative for dysuria and flank pain.  Musculoskeletal:  Negative for back pain and neck pain.  Neurological:  Positive for dizziness, weakness and light-headedness. Negative for seizures, numbness and headaches.  Psychiatric/Behavioral:  Positive for confusion.   All other systems reviewed and are negative.   Physical Exam Updated Vital Signs BP 104/62 (BP Location: Right Arm)   Pulse 95   Temp (!) 97.5 F (36.4 C) (Oral)   Resp 14   Ht 6' (1.829 m)   Wt 80.7 kg   SpO2 100%   BMI 24.14 kg/m  Physical Exam Vitals and nursing note reviewed.  Constitutional:      General: He is not in acute distress.    Appearance: He is well-developed. He is ill-appearing. He is not toxic-appearing or diaphoretic.  HENT:     Head: Normocephalic and atraumatic.     Nose: Rhinorrhea present. No congestion.     Mouth/Throat:     Mouth: Mucous membranes are dry.     Pharynx: No oropharyngeal exudate or posterior oropharyngeal erythema.  Eyes:     Extraocular Movements: Extraocular movements intact.     Conjunctiva/sclera: Conjunctivae normal.     Pupils: Pupils are equal, round, and reactive to light.  Cardiovascular:     Rate and Rhythm: Normal rate and regular rhythm.     Heart sounds: No murmur heard. Pulmonary:     Effort: Pulmonary effort is normal. No respiratory distress.     Breath sounds: Normal breath sounds. No wheezing, rhonchi or rales.  Chest:     Chest wall: No tenderness.  Abdominal:     Palpations: Abdomen is soft.     Tenderness: There is no abdominal tenderness. There is no guarding or rebound.  Musculoskeletal:        General: No swelling or tenderness.     Cervical back: Neck supple. No tenderness.  Skin:    General: Skin is warm and dry.     Capillary Refill: Capillary refill takes less than 2 seconds.  Neurological:     General: No focal deficit present.     Mental Status: He is alert.     Sensory: No sensory deficit.      Motor: No weakness.     ED Results / Procedures / Treatments   Labs (all labs ordered are listed, but only abnormal results are displayed) Labs Reviewed  BETA-HYDROXYBUTYRIC ACID - Abnormal; Notable for the following components:      Result Value   Beta-Hydroxybutyric Acid 1.04 (*)    All other components within normal limits  CBC WITH DIFFERENTIAL/PLATELET - Abnormal; Notable for the following components:   WBC 21.8 (*)    RBC 3.19 (*)    Hemoglobin 8.9 (*)    HCT 27.9 (*)    RDW 16.8 (*)    Neutro Abs 20.5 (*)    Lymphs Abs 0.6 (*)    Abs Immature Granulocytes 0.24 (*)    All other components within normal limits  BRAIN NATRIURETIC PEPTIDE -  Abnormal; Notable for the following components:   B Natriuretic Peptide >4,500.0 (*)    All other components within normal limits  TSH - Abnormal; Notable for the following components:   TSH 5.642 (*)    All other components within normal limits  COMPREHENSIVE METABOLIC PANEL - Abnormal; Notable for the following components:   Sodium 126 (*)    Chloride 91 (*)    CO2 19 (*)    Glucose, Bld 545 (*)    Creatinine, Ser 1.98 (*)    Calcium 8.5 (*)    Albumin 2.1 (*)    GFR, Estimated 37 (*)    Anion gap 16 (*)    All other components within normal limits  BASIC METABOLIC PANEL - Abnormal; Notable for the following components:   Sodium 127 (*)    Chloride 97 (*)    CO2 19 (*)    Glucose, Bld 479 (*)    Creatinine, Ser 1.80 (*)    Calcium 7.7 (*)    GFR, Estimated 42 (*)    All other components within normal limits  OSMOLALITY - Abnormal; Notable for the following components:   Osmolality 302 (*)    All other components within normal limits  HEMOGLOBIN A1C - Abnormal; Notable for the following components:   Hgb A1c MFr Bld 13.3 (*)    All other components within normal limits  MAGNESIUM - Abnormal; Notable for the following components:   Magnesium 1.5 (*)    All other components within normal limits  CBG MONITORING, ED -  Abnormal; Notable for the following components:   Glucose-Capillary 548 (*)    All other components within normal limits  I-STAT VENOUS BLOOD GAS, ED - Abnormal; Notable for the following components:   pH, Ven 7.486 (*)    pCO2, Ven 27.9 (*)    Sodium 128 (*)    Calcium, Ion 0.98 (*)    HCT 30.0 (*)    Hemoglobin 10.2 (*)    All other components within normal limits  I-STAT CG4 LACTIC ACID, ED - Abnormal; Notable for the following components:   Lactic Acid, Venous 2.6 (*)    All other components within normal limits  I-STAT CHEM 8, ED - Abnormal; Notable for the following components:   Sodium 127 (*)    Chloride 95 (*)    Creatinine, Ser 1.90 (*)    Glucose, Bld 564 (*)    Calcium, Ion 0.97 (*)    TCO2 20 (*)    Hemoglobin 10.2 (*)    HCT 30.0 (*)    All other components within normal limits  I-STAT CG4 LACTIC ACID, ED - Abnormal; Notable for the following components:   Lactic Acid, Venous 2.3 (*)    All other components within normal limits  CBG MONITORING, ED - Abnormal; Notable for the following components:   Glucose-Capillary 462 (*)    All other components within normal limits  CBG MONITORING, ED - Abnormal; Notable for the following components:   Glucose-Capillary 409 (*)    All other components within normal limits  RESP PANEL BY RT-PCR (RSV, FLU A&B, COVID)  [id number]  AMMONIA  LIPASE, BLOOD  ETHANOL  URINALYSIS, ROUTINE W REFLEX MICROSCOPIC  RAPID URINE DRUG SCREEN, HOSP PERFORMED  BASIC METABOLIC PANEL  BASIC METABOLIC PANEL  COMPREHENSIVE METABOLIC PANEL  MAGNESIUM  PHOSPHORUS  TYPE AND SCREEN  ABO/RH    EKG EKG Interpretation Date/Time:  Tuesday June 13 2023 18:52:30 EST Ventricular Rate:  95 PR Interval:  152 QRS  Duration:  148 QT Interval:  418 QTC Calculation: 525 R Axis:   72  Text Interpretation: Normal sinus rhythm Left bundle branch block Abnormal ECG When compared with ECG of 14-Oct-2022 05:40, PREVIOUS ECG IS PRESENT when compared to  prior, faster rate. No STEMI Confirmed by Theda Belfast ([id number]) on 06/13/2023 7:55:27 PM  Radiology DG Chest Portable 1 View Result Date: 06/13/2023 CLINICAL DATA:  Cough, altered mental status EXAM: PORTABLE CHEST 1 VIEW COMPARISON:  08/18/2022 FINDINGS: Stable cardiomegaly. Aortic atherosclerotic calcification. Diffuse interstitial coarsening greatest in the mid and lower lungs bilaterally. No focal pneumonia. No pleural effusion or pneumothorax. IMPRESSION: Cardiomegaly with interstitial coarsening which may be due to edema or atypical infection. Electronically Signed   By: Minerva Fester M.D.   On: 06/13/2023 21:10   CT HEAD WO CONTRAST ( ) Result Date: 06/13/2023 CLINICAL DATA:  Neuro deficit, acute, stroke suspected Dizziness, altered mental status EXAM: CT HEAD WITHOUT CONTRAST TECHNIQUE: Contiguous axial images were obtained from the base of the skull through the vertex without intravenous contrast. RADIATION DOSE REDUCTION: This exam was performed according to the departmental dose-optimization program which includes automated exposure control, adjustment of the mA and/or kV according to patient size and/or use of iterative reconstruction technique. COMPARISON:  None Available. FINDINGS: Brain: Probably acute or subacute infarcts in the posterior right frontal lobe. Possible slight petechial hemorrhage without mass occupying acute hemorrhage. No significant mass effect. Midline shift. No hydrocephalus. No visible mass lesion. Remote bilateral cerebellar infarcts. Vascular: No hyperdense vessel.  Calcific atherosclerosis. Skull: No acute fracture. Sinuses/Orbits: Clear sinuses.  No acute orbital findings. Other: No mastoid effusions. IMPRESSION: 1. Probably acute or subacute infarcts in the posterior right frontal lobe. Recommend MRI. 2. Possible slight petechial hemorrhage without mass occupying acute hemorrhage. 3. No significant mass effect. Electronically Signed   By: Feliberto Harts M.D.    On: 06/13/2023 20:51    Procedures Procedures    CRITICAL CARE Performed by: Canary Brim Tysheena Ginzburg Total critical care time: 35 minutes Critical care time was exclusive of separately billable procedures and treating other patients. Critical care was necessary to treat or prevent imminent or life-threatening deterioration. Critical care was time spent personally by me on the following activities: development of treatment plan with patient and/or surrogate as well as nursing, discussions with consultants, evaluation of patient's response to treatment, examination of patient, obtaining history from patient or surrogate, ordering and performing treatments and interventions, ordering and review of laboratory studies, ordering and review of radiographic studies, pulse oximetry and re-evaluation of patient's condition.  Medications Ordered in ED Medications  insulin regular, human (MYXREDLIN) 100 units/ 100 mL infusion (12 Units/hr Intravenous Rate/Dose Change 06/13/23 2242)  lactated ringers infusion (has no administration in time range)  dextrose 5 % in lactated ringers infusion (has no administration in time range)  dextrose 50 % solution 0-50 mL (has no administration in time range)  potassium chloride 10 mEq in 100 mL IVPB (10 mEq Intravenous New Bag/Given 06/13/23 2147)  acetaminophen (TYLENOL) tablet 650 mg (has no administration in time range)    Or  acetaminophen (TYLENOL) suppository 650 mg (has no administration in time range)  melatonin tablet 3 mg (has no administration in time range)  ondansetron (ZOFRAN) injection 4 mg (has no administration in time range)  sodium chloride 0.9 % bolus 1,000 mL (0 mLs Intravenous Stopped 06/13/23 2103)  lactated ringers bolus 1,614 mL (1,614 mLs Intravenous New Bag/Given 06/13/23 2147)    ED Course/ Medical Decision Making/ A&P  Medical Decision Making Amount and/or Complexity of Data Reviewed Labs:  ordered. Radiology: ordered.  Risk Prescription drug management. Decision regarding hospitalization.     Derek Thompson is a 63 y.o. male with a past medical history significant for hypertension, diabetes, previous prostate cancer, CAD with prior MI, CHF, pulmonary edema, and depression who presents for fatigue, chills, nausea, cough, urinary frequency, malaise, dizziness, and fatigue.  According to patient, he is having symptoms about a week and is felt off.  He reports that he has had some chills and some cough but denied any chest pain or shortness of breath.  Reports some urinary frequency but did not report significant constipation or diarrhea.  He reports feeling very tired and weak all over but no focal neurological plaints aside from some lightheadedness and dizziness.  Was not reporting any speech or vision changes.  Reports his hands feel like they are not acting coordinated bilaterally and symmetrically.  He does not report any fall or traumas.  Denies any abdominal pain or chest pain.  Does not report any neck stiffness or neck pain.  Reports that he feels dehydrated.  On exam, lungs clear.  Chest and abdomen nontender.  Good bowel sounds.  Patient had intact sensation and strength in extremities.  Symmetric smile.  Speech was slow but otherwise clear.  Pupils are symmetric and reactive with normal extraocular movements.  Sugar was found to be over 500 in triage.  Patient is unsure if he is ever had DKA.  Patient will have workup to look for different source of altered mental status, infection, and help rule out DKA versus HHS versus other abnormalities.      Workup began to return.  Glucose found to be 564 on the i-STAT Chem-8.  Patient's creatinine elevated at 1.9.  Lactic acid elevated as well.  Given the cough will check for COVID/flu/RSV as well as a chest x-ray.  Will get a head CT given the altered mental status and his report of intermittent lightheadedness and dizziness.  Will  order some fluids for his dry mucous membranes and hyperglycemia with suspected dehydration.  Does have some mild edema in his legs so we will check a BNP however he does not clinically look fluid overloaded.  Lactic acid is elevated slightly, will trend.  His gas does not show evidence of acidosis so more concerned about HHS than DKA.  Will wait for other labs and imaging however he will need admission for hyperglycemia and altered mental status when workup is completed.  Given lack of headache or neck pain have low suspicion for meningitis.  Anticipate admission after workup.   9:31 PM Workup continues to return.  Patient does have elevated lactic acid and elevated white blood cell count and anemia present.  Patient's labs show he does have hyperglycemia and elevated anion gap.  Given his altered mental status I suspect is more HHS as he is not acidotic.  Beta hydroxy uric acid is elevated.  Likely pseudohyponatremia present.  BNP is over 4500 which is concerning given the edema on exam.  He is not hypoxic and denying any shortness of breath.  He was given some fluids earlier and we will order glucose stabilizer in the order set for elevated glucose however he will likely need diuresis as well during his admission.  CT scan showed possible acute to subacute stroke in the frontal lobe.  Neurology was called and they recommended MRI.  If MRI does show acute stroke they would see the patient  in consultation.  Otherwise suspect this is combination of also mental status related to glucose crisis.  X-ray of the chest showed edema versus atypical infection.  Still waiting on his viral swab to return but given the elevated white blood cell count and lactic acid, will discuss with admitting team if they would like antibiotics now or wait for viral testing to return.  Patient will need admission.         Final Clinical Impression(s) / ED Diagnoses Final diagnoses:  Dehydration  Hyperglycemia  Altered  mental status, unspecified altered mental status type   Clinical Impression: 1. Dehydration   2. Hyperglycemia   3. Altered mental status, unspecified altered mental status type     Disposition: Admit  This note was prepared with assistance of Dragon voice recognition software. Occasional wrong-word or sound-a-like substitutions may have occurred due to the inherent limitations of voice recognition software.     Eri Mcevers, Canary Brim, MD 06/13/23 [phone]

## 2023-06-13 NOTE — ED Notes (Signed)
Patient transported to admitting floor. All belongings with patient. Patient without questions at this time.

## 2023-06-13 NOTE — H&P (Incomplete)
History and Physical      Derek Thompson ZOX:[id number] DOB: [birth year] DOA: 06/13/2023; DOS: 06/13/2023  PCP: Rana Snare, DO *** Patient coming from: home ***  I have personally briefly reviewed patient's old medical records in [facility]  Chief Complaint: ***  HPI: Derek Thompson is a 63 y.o. male with medical history significant for *** who is admitted to [facility] on 06/13/2023 with *** after presenting from home*** to [facility] ED complaining of ***.    ***       ***   ED Course:  Vital signs in the ED were notable for the following: ***  Labs were notable for the following: ***  Per my interpretation, EKG in ED demonstrated the following:  ***  Imaging in the ED, per corresponding formal radiology read, was notable for the following:  ***  While in the ED, the following were administered: ***  Subsequently, the patient was admitted  ***  ***red    Review of Systems: As per HPI otherwise 10 point review of systems negative.   Past Medical History:  Diagnosis Date   Anemia    Diabetes mellitus without complication (HCC) 1987   Family history of breast cancer    Hypertension    Myocardial infarction [facility])    Prostate cancer [facility])     Past Surgical History:  Procedure Laterality Date   DENTAL SURGERY     RADIOACTIVE SEED IMPLANT N/A 02/04/2021   Procedure: RADIOACTIVE SEED IMPLANT/BRACHYTHERAPY IMPLANT;  Surgeon: Jannifer Hick, MD;  Location: [facility] ORS;  Service: Urology;  Laterality: N/A;   RIGHT/LEFT HEART CATH AND CORONARY ANGIOGRAPHY N/A 05/25/2020   Procedure: RIGHT/LEFT HEART CATH AND CORONARY ANGIOGRAPHY;  Surgeon: Swaziland, Peter M, MD;  Location: [facility] INVASIVE CV LAB;  Service: Cardiovascular;  Laterality: N/A;   RIGHT/LEFT HEART CATH AND CORONARY ANGIOGRAPHY N/A 10/14/2022   Procedure: RIGHT/LEFT HEART CATH AND CORONARY ANGIOGRAPHY;  Surgeon: Dolores Patty, MD;  Location: [facility] INVASIVE CV LAB;  Service: Cardiovascular;  Laterality: N/A;    SPACE OAR INSTILLATION N/A 02/04/2021   Procedure: SPACE OAR INSTILLATION;  Surgeon: Jannifer Hick, MD;  Location: [facility] ORS;  Service: Urology;  Laterality: N/A;    Social History:  reports that he quit smoking about 6 years ago. His smoking use included cigarettes. He started smoking about 49 years ago. He has a 43 pack-year smoking history. He has never used smokeless tobacco. He reports that he does not currently use alcohol. He reports that he does not currently use drugs.   No Known Allergies  Family History  Problem Relation Age of Onset   Breast cancer Mother        dx 30s/40s, twice   Cancer Maternal Grandfather        unknown type, d. >64    Family history reviewed and not pertinent ***   Prior to Admission medications   Medication Sig Start Date End Date Taking? Authorizing Provider  Accu-Chek Softclix Lancets lancets SMARTSIG:Topical 1-4 Times Daily 08/20/22   Atway, Rayann N, DO  aspirin EC 81 MG tablet Take 1 tablet (81 mg total) by mouth daily. Swallow whole. 09/07/22 09/07/23  Robbie Lis M, PA-C  atorvastatin (LIPITOR) 40 MG tablet TAKE 1 TABLET BY MOUTH EVERY DAY 09/12/22   Lyndle Herrlich, MD  blood glucose meter kit and supplies KIT Dispense based on patient and insurance preference. Use up to four times daily as directed. (FOR ICD-9 250.00, 250.01). 08/20/22   Atway, Rayann N, DO  Continuous Glucose Receiver (DEXCOM G7 RECEIVER) DEVI Use as directed 08/27/22   Lyndle Herrlich, MD  digoxin (LANOXIN) 0.125 MG tablet Take 1 tablet (0.125 mg total) by mouth daily. 09/07/22   Robbie Lis M, PA-C  empagliflozin (JARDIANCE) 10 MG TABS tablet Take 1 tablet (10 mg total) by mouth daily before breakfast. 11/22/22   Rana Snare, DO  ezetimibe (ZETIA) 10 MG tablet Take 1 tablet (10 mg total) by mouth daily. 11/25/22   Rana Snare, DO  ferrous sulfate 325 (65 FE) MG tablet TAKE 1 TABLET BY MOUTH EVERY DAY 09/20/22   Lyndle Herrlich, MD  furosemide  (LASIX) 20 MG tablet Take 1 tablet (20 mg total) by mouth daily. 10/05/22 10/05/23  Bensimhon, Bevelyn Buckles, MD  gabapentin (NEURONTIN) 300 MG capsule TAKE 1 CAPSULE BY MOUTH TWICE A DAY 09/29/22   Lyndle Herrlich, MD  glucose blood (CONTOUR NEXT TEST) test strip Use to check blood sugar up to 3 times daily as instructed 11/22/22   Rana Snare, DO  insulin glargine (LANTUS SOLOSTAR) 100 UNIT/ML Solostar Pen INJECT 16 UNITS INTO THE SKIN AT BEDTIME. 10/10/22   Lyndle Herrlich, MD  metFORMIN (GLUCOPHAGE) 1000 MG tablet TAKE 1 TABLET (1,000 MG TOTAL) BY MOUTH TWICE A DAY WITH FOOD 10/28/22   Bensimhon, Bevelyn Buckles, MD  metoprolol succinate (TOPROL-XL) 25 MG 24 hr tablet TAKE 1 TABLET BY MOUTH DAILY 09/12/22   Lyndle Herrlich, MD  Multiple Vitamin (MULTIVITAMIN) tablet Take 1 tablet by mouth daily.    [provider]  sacubitril-valsartan (ENTRESTO) 97-103 MG Take 1 tablet by mouth 2 (two) times daily. 09/20/22   Bensimhon, Bevelyn Buckles, MD  spironolactone (ALDACTONE) 25 MG tablet Take 1 tablet (25 mg total) by mouth daily. 10/05/22   Bensimhon, Bevelyn Buckles, MD  tamsulosin (FLOMAX) 0.4 MG CAPS capsule TAKE 1 CAPSULE BY MOUTH EVERYDAY AT BEDTIME 09/12/22   Lyndle Herrlich, MD     Objective    Physical Exam: Vitals:   06/13/23 1850 06/13/23 1850 06/13/23 1851  BP:  104/62   Pulse:  95   Resp:  14   Temp: (!) 97.5 F (36.4 C)    TempSrc: Oral    SpO2:  100%   Weight:   80.7 kg  Height:   6' (1.829 m)    General: appears to be stated age; alert, oriented Skin: warm, dry, no rash Head:  AT/Keyes Mouth:  Oral mucosa membranes appear moist, normal dentition Neck: supple; trachea midline Heart:  RRR; did not appreciate any M/R/G Lungs: CTAB, did not appreciate any wheezes, rales, or rhonchi Abdomen: + BS; soft, ND, NT Vascular: 2+ pedal pulses b/l; 2+ radial pulses b/l Extremities: no peripheral edema, no muscle wasting Neuro: strength and sensation intact in upper and lower  extremities b/l ***   *** Neuro: 5/5 strength of the proximal and distal flexors and extensors of the upper and lower extremities bilaterally; sensation intact in upper and lower extremities b/l; cranial nerves II through XII grossly intact; no pronator drift; no evidence suggestive of slurred speech, dysarthria, or facial droop; Normal muscle tone. No tremors.  *** Neuro: In the setting of the patient's current mental status and associated inability to follow instructions, unable to perform full neurologic exam at this time.  As such, assessment of strength, sensation, and cranial nerves is limited at this time. Patient noted to spontaneously move all 4 extremities. No tremors.  ***    Labs on Admission: I have personally reviewed following labs and imaging studies  CBC:  Recent Labs  Lab 06/13/23 1940 06/13/23 1958 06/13/23 1959  WBC 21.8*  --   --   NEUTROABS 20.5*  --   --   HGB 8.9* 10.2* 10.2*  HCT 27.9* 30.0* 30.0*  MCV 87.5  --   --   PLT 195  --   --    Basic Metabolic Panel: Recent Labs  Lab 06/13/23 1939 06/13/23 1958 06/13/23 1959  NA 126* 128* 127*  K 4.1 4.0 4.1  CL 91*  --  95*  CO2 19*  --   --   GLUCOSE 545*  --  564*  BUN 19  --  21  CREATININE 1.98*  --  1.90*  CALCIUM 8.5*  --   --    GFR: Estimated Creatinine Clearance: 43.7 mL/min (A) (by C-G formula based on SCr of 1.9 mg/dL (H)). Liver Function Tests: Recent Labs  Lab 06/13/23 1939  AST 29  ALT 27  ALKPHOS 49  BILITOT 0.7  PROT 6.7  ALBUMIN 2.1*   Recent Labs  Lab 06/13/23 1939  LIPASE 18   Recent Labs  Lab 06/13/23 2031  AMMONIA 23   Coagulation Profile: No results for input(s): "INR", "PROTIME" in the last 168 hours. Cardiac Enzymes: No results for input(s): "CKTOTAL", "CKMB", "CKMBINDEX", "TROPONINI" in the last 168 hours. BNP (last 3 results) No results for input(s): "PROBNP" in the last 8760 hours. HbA1C: No results for input(s): "HGBA1C" in the last 72  hours. CBG: Recent Labs  Lab 06/13/23 1847 06/13/23 2146  GLUCAP 548* 462*   Lipid Profile: No results for input(s): "CHOL", "HDL", "LDLCALC", "TRIG", "CHOLHDL", "LDLDIRECT" in the last 72 hours. Thyroid Function Tests: Recent Labs    06/13/23 1939  TSH 5.642*   Anemia Panel: No results for input(s): "VITAMINB12", "FOLATE", "FERRITIN", "TIBC", "IRON", "RETICCTPCT" in the last 72 hours. Urine analysis:    Component Value Date/Time   COLORURINE YELLOW 05/03/2020 1303   APPEARANCEUR CLEAR 05/03/2020 1303   LABSPEC 1.022 05/03/2020 1303   PHURINE 6.0 05/03/2020 1303   GLUCOSEU >=500 (A) 05/03/2020 1303   HGBUR NEGATIVE 05/03/2020 1303   BILIRUBINUR NEGATIVE 05/03/2020 1303   KETONESUR 80 (A) 05/03/2020 1303   PROTEINUR NEGATIVE 05/03/2020 1303   NITRITE NEGATIVE 05/03/2020 1303   LEUKOCYTESUR NEGATIVE 05/03/2020 1303    Radiological Exams on Admission: DG Chest Portable 1 View Result Date: 06/13/2023 CLINICAL DATA:  Cough, altered mental status EXAM: PORTABLE CHEST 1 VIEW COMPARISON:  08/18/2022 FINDINGS: Stable cardiomegaly. Aortic atherosclerotic calcification. Diffuse interstitial coarsening greatest in the mid and lower lungs bilaterally. No focal pneumonia. No pleural effusion or pneumothorax. IMPRESSION: Cardiomegaly with interstitial coarsening which may be due to edema or atypical infection. Electronically Signed   By: Minerva Fester M.D.   On: 06/13/2023 21:10   CT HEAD WO CONTRAST ( ) Result Date: 06/13/2023 CLINICAL DATA:  Neuro deficit, acute, stroke suspected Dizziness, altered mental status EXAM: CT HEAD WITHOUT CONTRAST TECHNIQUE: Contiguous axial images were obtained from the base of the skull through the vertex without intravenous contrast. RADIATION DOSE REDUCTION: This exam was performed according to the departmental dose-optimization program which includes automated exposure control, adjustment of the mA and/or kV according to patient size and/or use of  iterative reconstruction technique. COMPARISON:  None Available. FINDINGS: Brain: Probably acute or subacute infarcts in the posterior right frontal lobe. Possible slight petechial hemorrhage without mass occupying acute hemorrhage. No significant mass effect. Midline shift. No hydrocephalus. No visible mass lesion. Remote bilateral cerebellar infarcts. Vascular: No hyperdense vessel.  Calcific atherosclerosis. Skull: No acute fracture. Sinuses/Orbits: Clear sinuses.  No acute orbital findings. Other: No mastoid effusions. IMPRESSION: 1. Probably acute or subacute infarcts in the posterior right frontal lobe. Recommend MRI. 2. Possible slight petechial hemorrhage without mass occupying acute hemorrhage. 3. No significant mass effect. Electronically Signed   By: Feliberto Harts M.D.   On: 06/13/2023 20:51      Assessment/Plan   Principal Problem:   Hyperosmolar hyperglycemic state (HHS) (HCC)   ***            ***                ***                 ***               ***               ***               ***                ***               ***               ***               ***               ***              ***          ***  DVT prophylaxis: SCD's ***  Code Status: Full code*** Family Communication: none*** Disposition Plan: Per Rounding Team Consults called: none***;  Admission status: ***     I SPENT GREATER THAN 75 *** MINUTES IN CLINICAL CARE TIME/MEDICAL DECISION-MAKING IN COMPLETING THIS ADMISSION.      Chaney Born Alfred Harrel DO [facility]  From 7PM - 7AM   06/13/2023, 10:07 PM   ***

## 2023-06-13 NOTE — ED Provider Triage Note (Signed)
Emergency Medicine Provider Triage Evaluation Note  Derek Thompson , a 63 y.o. male  was evaluated in triage.  Pt complains of generalized weakness. Patient appears very weak, he is getting a room right now. Denies fever or chills. Denies focal weakness. Glucose 400's has not been checking at home. Patient denies frequent urination.   Review of Systems  Positive: Generalizes weakness getting worse, cough  Negative: CP, SOB  Physical Exam  BP 104/62 (BP Location: Right Arm)   Pulse 95   Temp (!) 97.5 F (36.4 C) (Oral)   Resp 14   Ht 6' (1.829 m)   Wt 80.7 kg   SpO2 100%   BMI 24.14 kg/m  Gen:   Awake, no distress   Resp:  Normal effort  MSK:   Moves extremities without difficulty  Other:  Appears pale and frail, no focal weakness   Medical Decision Making  Medically screening exam initiated at 7:02 PM.  Appropriate orders placed.  Derek Thompson was informed that the remainder of the evaluation will be completed by another provider, this initial triage assessment does not replace that evaluation, and the importance of remaining in the ED until their evaluation is complete.  Reports that he has been too weak to get up and go to the grocery store.  Patient reports he has not been eating because of this.  He is not sure for how long he has not been eating but also reports decreased appetite.    Reinaldo Raddle 06/13/23 1905

## 2023-06-13 NOTE — ED Triage Notes (Signed)
Patient drove to [facility] [employer] and complained of stroke like symptoms but patient has generalized weakness all over and blood sugar upon arrival is 548.  Patient appears pale and weak.  BP is soft and patient is clammy.

## 2023-06-14 ENCOUNTER — Inpatient Hospital Stay (HOSPITAL_COMMUNITY): Payer: BC Managed Care – PPO

## 2023-06-14 ENCOUNTER — Encounter (HOSPITAL_COMMUNITY): Payer: Self-pay | Admitting: Internal Medicine

## 2023-06-14 DIAGNOSIS — E8721 Acute metabolic acidosis: Secondary | ICD-10-CM

## 2023-06-14 DIAGNOSIS — L97509 Non-pressure chronic ulcer of other part of unspecified foot with unspecified severity: Secondary | ICD-10-CM

## 2023-06-14 DIAGNOSIS — R0989 Other specified symptoms and signs involving the circulatory and respiratory systems: Secondary | ICD-10-CM | POA: Diagnosis not present

## 2023-06-14 DIAGNOSIS — E872 Acidosis, unspecified: Secondary | ICD-10-CM | POA: Diagnosis present

## 2023-06-14 DIAGNOSIS — I63421 Cerebral infarction due to embolism of right anterior cerebral artery: Secondary | ICD-10-CM | POA: Diagnosis not present

## 2023-06-14 DIAGNOSIS — A419 Sepsis, unspecified organism: Secondary | ICD-10-CM | POA: Diagnosis not present

## 2023-06-14 DIAGNOSIS — E119 Type 2 diabetes mellitus without complications: Secondary | ICD-10-CM | POA: Diagnosis not present

## 2023-06-14 DIAGNOSIS — I96 Gangrene, not elsewhere classified: Secondary | ICD-10-CM

## 2023-06-14 DIAGNOSIS — G9341 Metabolic encephalopathy: Secondary | ICD-10-CM | POA: Diagnosis present

## 2023-06-14 DIAGNOSIS — D649 Anemia, unspecified: Secondary | ICD-10-CM | POA: Diagnosis present

## 2023-06-14 DIAGNOSIS — E8729 Other acidosis: Secondary | ICD-10-CM | POA: Diagnosis present

## 2023-06-14 DIAGNOSIS — E1100 Type 2 diabetes mellitus with hyperosmolarity without nonketotic hyperglycemic-hyperosmolar coma (NKHHC): Secondary | ICD-10-CM | POA: Diagnosis not present

## 2023-06-14 DIAGNOSIS — R6521 Severe sepsis with septic shock: Secondary | ICD-10-CM

## 2023-06-14 DIAGNOSIS — R531 Weakness: Secondary | ICD-10-CM

## 2023-06-14 DIAGNOSIS — I639 Cerebral infarction, unspecified: Secondary | ICD-10-CM | POA: Diagnosis not present

## 2023-06-14 DIAGNOSIS — I6389 Other cerebral infarction: Secondary | ICD-10-CM

## 2023-06-14 DIAGNOSIS — N179 Acute kidney failure, unspecified: Secondary | ICD-10-CM | POA: Diagnosis present

## 2023-06-14 DIAGNOSIS — I70261 Atherosclerosis of native arteries of extremities with gangrene, right leg: Secondary | ICD-10-CM | POA: Diagnosis not present

## 2023-06-14 DIAGNOSIS — R739 Hyperglycemia, unspecified: Secondary | ICD-10-CM

## 2023-06-14 DIAGNOSIS — R6510 Systemic inflammatory response syndrome (SIRS) of non-infectious origin without acute organ dysfunction: Secondary | ICD-10-CM | POA: Diagnosis present

## 2023-06-14 DIAGNOSIS — D638 Anemia in other chronic diseases classified elsewhere: Secondary | ICD-10-CM

## 2023-06-14 DIAGNOSIS — R931 Abnormal findings on diagnostic imaging of heart and coronary circulation: Secondary | ICD-10-CM

## 2023-06-14 LAB — ECHOCARDIOGRAM COMPLETE
AR max vel: 2.03 cm2
AV Area VTI: 2.01 cm2
AV Area mean vel: 1.54 cm2
AV Mean grad: 2 mm[Hg]
AV Peak grad: 3.3 mm[Hg]
Ao pk vel: 0.91 m/s
Area-P 1/2: 5.2 cm2
Calc EF: 21.2 %
Height: 72 in
MV M vel: 4.03 m/s
MV Peak grad: 65 mm[Hg]
MV VTI: 1.4 cm2
Radius: 0.5 cm
S' Lateral: 4.8 cm
Single Plane A2C EF: 32.7 %
Single Plane A4C EF: 10 %
Weight: 2733.7 [oz_av]

## 2023-06-14 LAB — GLUCOSE, CAPILLARY
Glucose-Capillary: 113 mg/dL — ABNORMAL HIGH (ref 70–99)
Glucose-Capillary: 133 mg/dL — ABNORMAL HIGH (ref 70–99)
Glucose-Capillary: 135 mg/dL — ABNORMAL HIGH (ref 70–99)
Glucose-Capillary: 139 mg/dL — ABNORMAL HIGH (ref 70–99)
Glucose-Capillary: 162 mg/dL — ABNORMAL HIGH (ref 70–99)
Glucose-Capillary: 170 mg/dL — ABNORMAL HIGH (ref 70–99)
Glucose-Capillary: 181 mg/dL — ABNORMAL HIGH (ref 70–99)
Glucose-Capillary: 203 mg/dL — ABNORMAL HIGH (ref 70–99)
Glucose-Capillary: 203 mg/dL — ABNORMAL HIGH (ref 70–99)
Glucose-Capillary: 204 mg/dL — ABNORMAL HIGH (ref 70–99)
Glucose-Capillary: 228 mg/dL — ABNORMAL HIGH (ref 70–99)
Glucose-Capillary: 269 mg/dL — ABNORMAL HIGH (ref 70–99)

## 2023-06-14 LAB — COMPREHENSIVE METABOLIC PANEL
ALT: 22 U/L (ref 0–44)
AST: 26 U/L (ref 15–41)
Albumin: 1.8 g/dL — ABNORMAL LOW (ref 3.5–5.0)
Alkaline Phosphatase: 41 U/L (ref 38–126)
Anion gap: 18 — ABNORMAL HIGH (ref 5–15)
BUN: 20 mg/dL (ref 8–23)
CO2: 13 mmol/L — ABNORMAL LOW (ref 22–32)
Calcium: 8 mg/dL — ABNORMAL LOW (ref 8.9–10.3)
Chloride: 99 mmol/L (ref 98–111)
Creatinine, Ser: 1.84 mg/dL — ABNORMAL HIGH (ref 0.61–1.24)
GFR, Estimated: 41 mL/min — ABNORMAL LOW (ref >60)
Glucose, Bld: 195 mg/dL — ABNORMAL HIGH (ref 70–99)
Potassium: 3.9 mmol/L (ref 3.5–5.1)
Sodium: 130 mmol/L — ABNORMAL LOW (ref 135–145)
Total Bilirubin: 0.6 mg/dL (ref 0.0–1.2)
Total Protein: 6 g/dL — ABNORMAL LOW (ref 6.5–8.1)

## 2023-06-14 LAB — PROTIME-INR
INR: 1.7 — ABNORMAL HIGH (ref 0.8–1.2)
Prothrombin Time: 20.2 s — ABNORMAL HIGH (ref 11.4–15.2)

## 2023-06-14 LAB — MRSA NEXT GEN BY PCR, NASAL: MRSA by PCR Next Gen: NOT DETECTED

## 2023-06-14 LAB — MAGNESIUM: Magnesium: 2.3 mg/dL (ref 1.7–2.4)

## 2023-06-14 LAB — PROCALCITONIN: Procalcitonin: 23.25 ng/mL

## 2023-06-14 LAB — BASIC METABOLIC PANEL
Anion gap: 9 (ref 5–15)
BUN: 20 mg/dL (ref 8–23)
CO2: 19 mmol/L — ABNORMAL LOW (ref 22–32)
Calcium: 7.7 mg/dL — ABNORMAL LOW (ref 8.9–10.3)
Chloride: 102 mmol/L (ref 98–111)
Creatinine, Ser: 1.51 mg/dL — ABNORMAL HIGH (ref 0.61–1.24)
GFR, Estimated: 52 mL/min — ABNORMAL LOW (ref >60)
Glucose, Bld: 229 mg/dL — ABNORMAL HIGH (ref 70–99)
Potassium: 4 mmol/L (ref 3.5–5.1)
Sodium: 130 mmol/L — ABNORMAL LOW (ref 135–145)

## 2023-06-14 LAB — COOXEMETRY PANEL
Carboxyhemoglobin: 2.2 % — ABNORMAL HIGH (ref 0.5–1.5)
Methemoglobin: <0.7 % (ref 0.0–1.5)
O2 Saturation: 66.2 %
Total hemoglobin: 8.5 g/dL — ABNORMAL LOW (ref 12.0–16.0)

## 2023-06-14 LAB — APTT: aPTT: 32 s (ref 24–36)

## 2023-06-14 LAB — PHOSPHORUS
Phosphorus: 1.6 mg/dL — ABNORMAL LOW (ref 2.5–4.6)
Phosphorus: 2.3 mg/dL — ABNORMAL LOW (ref 2.5–4.6)

## 2023-06-14 LAB — TROPONIN I (HIGH SENSITIVITY): Troponin I (High Sensitivity): 66 ng/L — ABNORMAL HIGH (ref <18)

## 2023-06-14 LAB — VITAMIN B12: Vitamin B-12: 908 pg/mL (ref 180–914)

## 2023-06-14 LAB — HEPARIN LEVEL (UNFRACTIONATED): Heparin Unfractionated: 0.11 [IU]/mL — ABNORMAL LOW (ref 0.30–0.70)

## 2023-06-14 LAB — FERRITIN: Ferritin: 264 ng/mL (ref 24–336)

## 2023-06-14 LAB — IRON AND TIBC
Iron: 22 ug/dL — ABNORMAL LOW (ref 45–182)
Saturation Ratios: 14 % — ABNORMAL LOW (ref 17.9–39.5)
TIBC: 161 ug/dL — ABNORMAL LOW (ref 250–450)
UIBC: 139 ug/dL

## 2023-06-14 LAB — LACTIC ACID, PLASMA: Lactic Acid, Venous: 8.7 mmol/L (ref 0.5–1.9)

## 2023-06-14 LAB — HIV ANTIBODY (ROUTINE TESTING W REFLEX): HIV Screen 4th Generation wRfx: NONREACTIVE

## 2023-06-14 MED ORDER — SODIUM CHLORIDE 0.9 % IV SOLN
500.0000 mg | Freq: Every day | INTRAVENOUS | Status: DC
  Administered 2023-06-14: 500 mg via INTRAVENOUS
  Filled 2023-06-14: qty 5

## 2023-06-14 MED ORDER — POTASSIUM & SODIUM PHOSPHATES 280-160-250 MG PO PACK
2.0000 | PACK | ORAL | Status: DC
  Filled 2023-06-14 (×4): qty 2

## 2023-06-14 MED ORDER — MAGNESIUM SULFATE 2 GM/50ML IV SOLN
2.0000 g | Freq: Once | INTRAVENOUS | Status: AC
Start: 2023-06-14 — End: 2023-06-14
  Administered 2023-06-14: 2 g via INTRAVENOUS
  Filled 2023-06-14: qty 50

## 2023-06-14 MED ORDER — METRONIDAZOLE 500 MG PO TABS
500.0000 mg | ORAL_TABLET | Freq: Two times a day (BID) | ORAL | Status: DC
Start: 2023-06-14 — End: 2023-06-21
  Administered 2023-06-14 – 2023-06-21 (×15): 500 mg via ORAL
  Filled 2023-06-14 (×15): qty 1

## 2023-06-14 MED ORDER — VANCOMYCIN VARIABLE DOSE PER UNSTABLE RENAL FUNCTION (PHARMACIST DOSING): Status: DC

## 2023-06-14 MED ORDER — INSULIN GLARGINE-YFGN 100 UNIT/ML ~~LOC~~ SOLN
20.0000 [IU] | Freq: Every day | SUBCUTANEOUS | Status: DC
  Administered 2023-06-14: 20 [IU] via SUBCUTANEOUS
  Filled 2023-06-14: qty 0.2

## 2023-06-14 MED ORDER — CHLORHEXIDINE GLUCONATE CLOTH 2 % EX PADS
6.0000 | MEDICATED_PAD | Freq: Every day | CUTANEOUS | Status: DC
  Administered 2023-06-14 – 2023-06-27 (×14): 6 via TOPICAL

## 2023-06-14 MED ORDER — MAGNESIUM SULFATE 2 GM/50ML IV SOLN
2.0000 g | Freq: Once | INTRAVENOUS | Status: AC
  Administered 2023-06-14: 2 g via INTRAVENOUS
  Filled 2023-06-14: qty 50

## 2023-06-14 MED ORDER — INSULIN ASPART 100 UNIT/ML IJ SOLN: 0.0000 [IU] | Freq: Every day | INTRAMUSCULAR | Status: DC

## 2023-06-14 MED ORDER — VANCOMYCIN HCL 1250 MG/250ML IV SOLN
1250.0000 mg | INTRAVENOUS | Status: DC
  Administered 2023-06-14: 1250 mg via INTRAVENOUS
  Filled 2023-06-14: qty 250

## 2023-06-14 MED ORDER — K PHOS MONO-SOD PHOS DI & MONO 155-852-130 MG PO TABS
500.0000 mg | ORAL_TABLET | ORAL | Status: AC
  Administered 2023-06-14 (×3): 500 mg via ORAL
  Filled 2023-06-14 (×5): qty 2

## 2023-06-14 MED ORDER — SODIUM CHLORIDE 0.9 % IV SOLN: 2.0000 g | Freq: Three times a day (TID) | INTRAVENOUS | Status: DC

## 2023-06-14 MED ORDER — INSULIN GLARGINE-YFGN 100 UNIT/ML ~~LOC~~ SOLN
12.0000 [IU] | Freq: Every day | SUBCUTANEOUS | Status: DC
  Administered 2023-06-15 – 2023-06-19 (×5): 12 [IU] via SUBCUTANEOUS
  Filled 2023-06-14 (×5): qty 0.12

## 2023-06-14 MED ORDER — PHENYLEPHRINE HCL-NACL 20-0.9 MG/250ML-% IV SOLN: 0.0000 ug/min | INTRAVENOUS | Status: DC

## 2023-06-14 MED ORDER — VANCOMYCIN HCL 1500 MG/300ML IV SOLN
1500.0000 mg | Freq: Once | INTRAVENOUS | Status: AC
  Administered 2023-06-14: 1500 mg via INTRAVENOUS
  Filled 2023-06-14: qty 300

## 2023-06-14 MED ORDER — STROKE: EARLY STAGES OF RECOVERY BOOK
Freq: Once | Status: AC
  Filled 2023-06-14: qty 1

## 2023-06-14 MED ORDER — NOREPINEPHRINE 4 MG/250ML-% IV SOLN
2.0000 ug/min | INTRAVENOUS | Status: DC
  Administered 2023-06-14: 2 ug/min via INTRAVENOUS
  Administered 2023-06-14: 3 ug/min via INTRAVENOUS
  Filled 2023-06-14: qty 250

## 2023-06-14 MED ORDER — SODIUM CHLORIDE 0.9% FLUSH: 10.0000 mL | INTRAVENOUS | Status: DC | PRN

## 2023-06-14 MED ORDER — SODIUM CHLORIDE 0.9 % IV SOLN
1.0000 g | Freq: Every day | INTRAVENOUS | Status: DC
  Administered 2023-06-14: 1 g via INTRAVENOUS
  Filled 2023-06-14: qty 10

## 2023-06-14 MED ORDER — PANTOPRAZOLE SODIUM 40 MG IV SOLR
40.0000 mg | Freq: Every day | INTRAVENOUS | Status: DC
  Administered 2023-06-14: 40 mg via INTRAVENOUS
  Filled 2023-06-14: qty 10

## 2023-06-14 MED ORDER — PANTOPRAZOLE SODIUM 40 MG PO TBEC
40.0000 mg | DELAYED_RELEASE_TABLET | Freq: Every day | ORAL | Status: DC
  Administered 2023-06-15 – 2023-06-29 (×15): 40 mg via ORAL
  Filled 2023-06-14 (×15): qty 1

## 2023-06-14 MED ORDER — ORAL CARE MOUTH RINSE: 15.0000 mL | OROMUCOSAL | Status: DC | PRN

## 2023-06-14 MED ORDER — HEPARIN (PORCINE) 25000 UT/250ML-% IV SOLN
1400.0000 [IU]/h | INTRAVENOUS | Status: AC
  Filled 2023-06-14: qty 250

## 2023-06-14 MED ORDER — HEPARIN SODIUM (PORCINE) 5000 UNIT/ML IJ SOLN: 5000.0000 [IU] | Freq: Three times a day (TID) | INTRAMUSCULAR | Status: DC

## 2023-06-14 MED ORDER — HEPARIN (PORCINE) 25000 UT/250ML-% IV SOLN
16.0000 [IU]/kg/h | INTRAVENOUS | Status: DC
  Administered 2023-06-14: 16 [IU]/kg/h via INTRAVENOUS
  Filled 2023-06-14: qty 250

## 2023-06-14 MED ORDER — ATORVASTATIN CALCIUM 80 MG PO TABS
80.0000 mg | ORAL_TABLET | Freq: Every day | ORAL | Status: DC
Start: 2023-06-14 — End: 2023-06-29
  Administered 2023-06-14 – 2023-06-29 (×15): 80 mg via ORAL
  Filled 2023-06-14 (×2): qty 1
  Filled 2023-06-14 (×2): qty 2
  Filled 2023-06-14 (×4): qty 1
  Filled 2023-06-14: qty 2
  Filled 2023-06-14: qty 1
  Filled 2023-06-14: qty 2
  Filled 2023-06-14: qty 1
  Filled 2023-06-14: qty 2
  Filled 2023-06-14 (×2): qty 1
  Filled 2023-06-14: qty 2

## 2023-06-14 MED ORDER — PERFLUTREN LIPID MICROSPHERE
1.0000 mL | INTRAVENOUS | Status: AC | PRN
  Administered 2023-06-14: 2 mL via INTRAVENOUS

## 2023-06-14 MED ORDER — CHLORHEXIDINE GLUCONATE CLOTH 2 % EX PADS: 6.0000 | MEDICATED_PAD | Freq: Every day | CUTANEOUS | Status: DC

## 2023-06-14 MED ORDER — SODIUM CHLORIDE 0.9% FLUSH
10.0000 mL | Freq: Two times a day (BID) | INTRAVENOUS | Status: DC
  Administered 2023-06-14 (×2): 10 mL
  Administered 2023-06-15: 20 mL
  Administered 2023-06-15: 10 mL
  Administered 2023-06-16: 30 mL
  Administered 2023-06-16 – 2023-06-18 (×5): 10 mL
  Administered 2023-06-19: 20 mL
  Administered 2023-06-19 – 2023-06-22 (×5): 10 mL
  Administered 2023-06-23: 20 mL
  Administered 2023-06-23: 10 mL
  Administered 2023-06-24: 20 mL
  Administered 2023-06-24 – 2023-06-25 (×3): 10 mL
  Administered 2023-06-26: 3 mL
  Administered 2023-06-26 – 2023-06-27 (×2): 10 mL
  Administered 2023-06-27 – 2023-06-28 (×2): 3 mL
  Administered 2023-06-28 – 2023-06-29 (×2): 10 mL

## 2023-06-14 MED ORDER — SODIUM CHLORIDE 0.9 % IV SOLN
250.0000 mL | INTRAVENOUS | Status: AC
  Administered 2023-06-14: 250 mL via INTRAVENOUS

## 2023-06-14 MED ORDER — POTASSIUM CHLORIDE 10 MEQ/100ML IV SOLN
10.0000 meq | Freq: Once | INTRAVENOUS | Status: AC
  Administered 2023-06-14: 10 meq via INTRAVENOUS
  Filled 2023-06-14: qty 100

## 2023-06-14 MED ORDER — INSULIN ASPART 100 UNIT/ML IJ SOLN: 0.0000 [IU] | Freq: Three times a day (TID) | INTRAMUSCULAR | Status: DC

## 2023-06-14 MED ORDER — INSULIN ASPART 100 UNIT/ML IJ SOLN
0.0000 [IU] | INTRAMUSCULAR | Status: DC
  Administered 2023-06-14 (×4): 3 [IU] via SUBCUTANEOUS
  Administered 2023-06-14: 2 [IU] via SUBCUTANEOUS
  Administered 2023-06-15: 1 [IU] via SUBCUTANEOUS
  Administered 2023-06-15: 2 [IU] via SUBCUTANEOUS
  Administered 2023-06-15: 1 [IU] via SUBCUTANEOUS
  Administered 2023-06-15 (×3): 3 [IU] via SUBCUTANEOUS
  Administered 2023-06-15: 2 [IU] via SUBCUTANEOUS
  Administered 2023-06-16: 1 [IU] via SUBCUTANEOUS
  Administered 2023-06-16 (×2): 2 [IU] via SUBCUTANEOUS
  Administered 2023-06-16 (×2): 1 [IU] via SUBCUTANEOUS
  Administered 2023-06-17: 2 [IU] via SUBCUTANEOUS
  Administered 2023-06-17: 1 [IU] via SUBCUTANEOUS
  Administered 2023-06-17: 2 [IU] via SUBCUTANEOUS
  Administered 2023-06-17: 1 [IU] via SUBCUTANEOUS

## 2023-06-14 MED ORDER — ASPIRIN 81 MG PO TBEC
81.0000 mg | DELAYED_RELEASE_TABLET | Freq: Every day | ORAL | Status: DC
  Administered 2023-06-14 – 2023-06-18 (×5): 81 mg via ORAL
  Filled 2023-06-14 (×6): qty 1

## 2023-06-14 MED ORDER — SODIUM CHLORIDE 0.9 % IV SOLN
2.0000 g | Freq: Two times a day (BID) | INTRAVENOUS | Status: DC
  Administered 2023-06-14 – 2023-06-15 (×3): 2 g via INTRAVENOUS
  Filled 2023-06-14 (×3): qty 12.5

## 2023-06-14 NOTE — TOC Initial Note (Signed)
Transition of Care [facility]) - Initial/Assessment Note    Patient Details  Name: Derek Thompson MRN: [id number] Date of Birth: 1960-11-17  [facility] CM/SW Contact:    Marliss Coots, LCSW Phone Number: 06/14/2023, 3:04 PM  Clinical Narrative:                  3:04 PM Per progressions and chart review, patient has therapy recommendation of discharge to SNF but patient is awaiting scheduled toe amputation.  Expected Discharge Plan: Skilled Nursing Facility Barriers to Discharge: Continued Medical Work up   Patient Goals and CMS Choice            Expected Discharge Plan and Services In-house Referral: Clinical Social Work     Living arrangements for the past 2 months: Hotel/Motel                                      Prior Living Arrangements/Services Living arrangements for the past 2 months: Hotel/Motel Lives with:: Self Patient language and need for interpreter reviewed:: Yes        Need for Family Participation in Patient Care: Yes (Comment) Care giver support system in place?: Yes (comment)   Criminal Activity/Legal Involvement Pertinent to Current Situation/Hospitalization: No - Comment as needed  Activities of Daily Living   ADL Screening (condition at time of admission) Independently performs ADLs?: Yes (appropriate for developmental age) Is the patient deaf or have difficulty hearing?: No Does the patient have difficulty seeing, even when wearing glasses/contacts?: Yes Does the patient have difficulty concentrating, remembering, or making decisions?: Yes  Permission Sought/Granted Permission sought to share information with : Family Supports Permission granted to share information with : No (Contact information on chart)  Share Information with NAME: Lafayette Dunlevy     Permission granted to share info w Relationship: Mother  Permission granted to share info w Contact Information: [phone]  Emotional Assessment        Orientation: : Oriented to Self, Oriented to Place, Oriented to Situation Alcohol / Substance Use: Not Applicable Psych Involvement: No (comment)  Admission diagnosis:  Dehydration [E86.0] Hyperglycemia [R73.9] Altered mental status, unspecified altered mental status type [R41.82] Hyperosmolar hyperglycemic state (HHS) (HCC) [E11.00] Patient Active Problem List   Diagnosis Date Noted   Generalized weakness 06/14/2023   Acute metabolic encephalopathy 06/14/2023   SIRS (systemic inflammatory response syndrome) (HCC) 06/14/2023   High anion gap metabolic acidosis 06/14/2023   Lactic acidosis 06/14/2023   AKI (acute kidney injury) (HCC) 06/14/2023   Acute on chronic anemia 06/14/2023   Hypomagnesemia 06/14/2023   Hyperglycemia 06/14/2023   Hyperosmolar hyperglycemic state (HHS) (HCC) 06/13/2023   Hyperkalemia 11/22/2022   Iron deficiency anemia 08/27/2022   MDD (major depressive disorder) 08/27/2022   Healthcare maintenance 08/27/2022   Genetic testing 10/12/2020   Malignant neoplasm of prostate (HCC)    Family history of breast cancer    CAD (coronary artery disease) 06/10/2020   Hypertension 06/10/2020   Dyslipidemia 06/10/2020   DM2 (diabetes mellitus, type 2) (HCC) 06/10/2020   Chronic combined systolic and diastolic heart failure (HCC)    Pulmonary edema cardiac cause (HCC) 05/22/2020   PCP:  Rana Snare, DO Pharmacy:   [facility] [address] [name] [address] Phone: [phone] Fax: [phone]  [facility] [address] [name] - [address] [name] [address] Phone: [phone] Fax:  [phone]     Social Drivers of Health (SDOH) Social History: SDOH Screenings   Food Insecurity: No Food Insecurity (06/14/2023)  Housing: Low Risk  (06/14/2023)  Transportation Needs: No Transportation Needs (06/14/2023)  Utilities: Not At Risk (06/14/2023)  Alcohol Screen: Low Risk  (08/19/2022)   Depression (PHQ2-9): Medium Risk (11/22/2022)  Financial Resource Strain: Low Risk  (08/19/2022)  Tobacco Use: Medium Risk (06/14/2023)   SDOH Interventions:     Readmission Risk Interventions     No data to display

## 2023-06-14 NOTE — Consult Note (Addendum)
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 He has right foot infection for 2 months without Rx. Denied f/c/r, N/V/D, SOB, abd pain, dysuria, or chest pain.  He independently stopped insulin and his oral hypoglycemic agents.   Pertinent  Medical History  Poorly controlled DM-2 (HbA1c 13.3%), CAD, CHF (systolic and diastolic, EF 25%, G2DD), anemia of chronic illness illness, HTN  Significant Hospital Events: Including procedures, antibiotic start and stop dates in addition to other pertinent events   Given Rocephin, Zithromax, 2.6 L (NS 0.9% and LR), insulin drip. Started on Levophed for hypotension 4:44 am today, 2 mcg/min now  Interim History / Subjective:    Objective   Blood pressure (!) 89/60, pulse 99, temperature 98.2 F (36.8 C), temperature source Oral, resp. rate 19, height 6' (1.829 m), weight 77.5 kg, SpO2 100%.        Intake/Output Summary (Last 24 hours) at 06/14/2023 0618 Last data filed at 06/14/2023 0530 Gross per 24 hour  Intake 1057.48 ml  Output 150 ml  Net 907.48 ml   Filed Weights   06/13/23 1851 06/14/23 0520  Weight: 80.7 kg 77.5 kg    Examination: General: alert, oriented x4, and comfortable. On RA. SpO2 100%  HENT: PERL, dry oral mucosa. No LNE or thyromegaly. No JVD Lungs: symmetrical air entry bilaterally. No crackles or wheezing Cardiovascular: NL S1/S2. No m/g/r Abdomen: no distension or  tenderness Extremities: distal edema. Symmetrical. Right second toe wet gangrene with foul smell. Peripheral pulses are present.   Neuro: LUE weakness +4/5.     Resolved Hospital Problem list     Assessment & Plan:  Septic shock due to right foot wet gangrene -Vanco and Zosyn -MRI foot -Cx -Surgery consult -X-ray: gas noted but osteomyelitis  Lactic acidosis/AGMA -IVF -I/O chart -Serial LA and trop   AKI -IVF -Renal U/S -Am labs HypoMg -Replace -Am labs Metabolic encephalopathy due to AKI, sepsis, strokes, and acidosis  Poorly controlled DM-2 (HbA1c 13.3%) -Glycemic control ISS -s/p insulin drip CAD, CHF (systolic and diastolic, EF 25%, G2DD), HTN -Hold BP meds -Echo -Statis, ASA -FT4, Lipid profile  Anemia of chronic illness illness -Monitor -Anemia w/u  Acute stroke (ischemic infracts, embolic) -Seen by neurology -ASA -Statin -Echo -CTA neck and brain when Cr is better   Best Practice (right click and "Reselect all SmartList Selections" daily)   Diet/type: NPO w/ oral meds DVT prophylaxis prophylactic heparin  Pressure ulcer(s): N/A GI prophylaxis: PPI Lines: Central line Foley:  Yes, and it is still needed Code Status:  full code Last date of multidisciplinary goals of care discussion []   Labs   CBC: Recent Labs  Lab 06/13/23 1940 06/13/23 1958 06/13/23 1959  WBC 21.8*  --   --   NEUTROABS 20.5*  --   --   HGB 8.9* 10.2* 10.2*  HCT 27.9* 30.0* 30.0*  MCV 87.5  --   --   PLT 195  --   --  Basic Metabolic Panel: Recent Labs  Lab 06/13/23 1939 06/13/23 1958 06/13/23 1959 06/13/23 2228  NA 126* 128* 127* 127*  K 4.1 4.0 4.1 3.8  CL 91*  --  95* 97*  CO2 19*  --   --  19*  GLUCOSE 545*  --  564* 479*  BUN 19  --  21 20  CREATININE 1.98*  --  1.90* 1.80*  CALCIUM 8.5*  --   --  7.7*  MG  --   --   --  1.5*   GFR: Estimated Creatinine Clearance: 46 mL/min (A) (by C-G formula based on SCr of 1.8 mg/dL (H)). Recent Labs  Lab  06/13/23 1940 06/13/23 1959 06/13/23 2232 06/14/23 0452  WBC 21.8*  --   --   --   LATICACIDVEN  --  2.6* 2.3* 8.7*    Liver Function Tests: Recent Labs  Lab 06/13/23 1939  AST 29  ALT 27  ALKPHOS 49  BILITOT 0.7  PROT 6.7  ALBUMIN 2.1*   Recent Labs  Lab 06/13/23 1939  LIPASE 18   Recent Labs  Lab 06/13/23 2031  AMMONIA 23    ABG    Component Value Date/Time   PHART 7.392 10/14/2022 0804   PCO2ART 33.8 10/14/2022 0804   PO2ART 96 10/14/2022 0804   HCO3 21.1 06/13/2023 1958   TCO2 20 (L) 06/13/2023 1959   ACIDBASEDEF 2.0 06/13/2023 1958   O2SAT 84 06/13/2023 1958     Coagulation Profile: No results for input(s): "INR", "PROTIME" in the last 168 hours.  Cardiac Enzymes: No results for input(s): "CKTOTAL", "CKMB", "CKMBINDEX", "TROPONINI" in the last 168 hours.  HbA1C: Hemoglobin A1C  Date/Time Value Ref Range Status  11/22/2022 01:32 PM 11.4 (A) 4.0 - 5.6 % Final   Hgb A1c MFr Bld  Date/Time Value Ref Range Status  06/13/2023 07:40 PM 13.3 (H) 4.8 - 5.6 % Final    Comment:    (NOTE) Pre diabetes:          5.7%-6.4%  Diabetes:              >6.4%  Glycemic control for   <7.0% adults with diabetes   08/18/2022 06:09 PM 13.6 (H) 4.8 - 5.6 % Final    Comment:    (NOTE) Pre diabetes:          5.7%-6.4%  Diabetes:              >6.4%  Glycemic control for   <7.0% adults with diabetes     CBG: Recent Labs  Lab 06/14/23 0229 06/14/23 0325 06/14/23 0417 06/14/23 0455 06/14/23 0516  GLUCAP 135* 113* 133* 139* 162*    Review of Systems:   AMS  Past Medical History:  He,  has a past medical history of Anemia, Diabetes mellitus without complication (HCC) (1987), Family history of breast cancer, Hypertension, Myocardial infarction (HCC), and Prostate cancer (HCC).   Surgical History:   Past Surgical History:  Procedure Laterality Date   DENTAL SURGERY     RADIOACTIVE SEED IMPLANT N/A 02/04/2021   Procedure: RADIOACTIVE SEED  IMPLANT/BRACHYTHERAPY IMPLANT;  Surgeon: Jannifer Hick, MD;  Location: [facility] ORS;  Service: Urology;  Laterality: N/A;   RIGHT/LEFT HEART CATH AND CORONARY ANGIOGRAPHY N/A 05/25/2020   Procedure: RIGHT/LEFT HEART CATH AND CORONARY ANGIOGRAPHY;  Surgeon: Swazil[place], Peter M, MD;  Location: [facility] INVASIVE CV LAB;  Service: Cardiovascular;  Laterality: N/A;   RIGHT/LEFT HEART CATH AND CORONARY ANGIOGRAPHY N/A 10/14/2022   Procedure: RIGHT/LEFT HEART CATH  AND CORONARY ANGIOGRAPHY;  Surgeon: Dolores Patty, MD;  Location: [facility] INVASIVE CV LAB;  Service: Cardiovascular;  Laterality: N/A;   SPACE OAR INSTILLATION N/A 02/04/2021   Procedure: SPACE OAR INSTILLATION;  Surgeon: Jannifer Hick, MD;  Location: [facility] ORS;  Service: Urology;  Laterality: N/A;     Social History:   reports that he quit smoking about 6 years ago. His smoking use included cigarettes. He started smoking about 49 years ago. He has a 43 pack-year smoking history. He has never used smokeless tobacco. He reports that he does not currently use alcohol. He reports that he does not currently use drugs.   Family History:  His family history includes Breast cancer in his mother; Cancer in his maternal grandfather.   Allergies No Known Allergies   Home Medications  Prior to Admission medications   Medication Sig Start Date End Date Taking? Authorizing Provider  Accu-Chek Softclix Lancets lancets SMARTSIG:Topical 1-4 Times Daily 08/20/22  Yes Atway, Rayann N, DO  aspirin EC 81 MG tablet Take 1 tablet (81 mg total) by mouth daily. Swallow whole. 09/07/22 09/07/23 Yes Simmons, Brittainy M, PA-C  blood glucose meter kit and supplies KIT Dispense based on patient and insurance preference. Use up to four times daily as directed. (FOR ICD-9 250.00, 250.01). 08/20/22  Yes Atway, Rayann N, DO  digoxin (LANOXIN) 0.125 MG tablet Take 1 tablet (0.125 mg total) by mouth daily. 09/07/22  Yes Robbie Lis M, PA-C  ezetimibe (ZETIA) 10 MG tablet Take 1 tablet (10  mg total) by mouth daily. 11/25/22  Yes Rana Snare, DO  furosemide (LASIX) 20 MG tablet Take 1 tablet (20 mg total) by mouth daily. 10/05/22 10/05/23 Yes Bensimhon, Bevelyn Buckles, MD  glucose blood (CONTOUR NEXT TEST) test strip Use to check blood sugar up to 3 times daily as instructed 11/22/22  Yes Rana Snare, DO  ibuprofen (ADVIL) 200 MG tablet Take 400 mg by mouth every 6 (six) hours as needed for headache or mild pain (pain score 1-3).   Yes [provider]  metoprolol succinate (TOPROL-XL) 25 MG 24 hr tablet TAKE 1 TABLET BY MOUTH DAILY 09/12/22  Yes Lyndle Herrlich, MD  spironolactone (ALDACTONE) 25 MG tablet Take 1 tablet (25 mg total) by mouth daily. 10/05/22  Yes Bensimhon, Bevelyn Buckles, MD  atorvastatin (LIPITOR) 40 MG tablet TAKE 1 TABLET BY MOUTH EVERY DAY Patient not taking: Reported on 06/13/2023 09/12/22   Lyndle Herrlich, MD  empagliflozin (JARDIANCE) 10 MG TABS tablet Take 1 tablet (10 mg total) by mouth daily before breakfast. Patient not taking: Reported on 06/13/2023 11/22/22   Rana Snare, DO  ferrous sulfate 325 (65 FE) MG tablet TAKE 1 TABLET BY MOUTH EVERY DAY Patient not taking: Reported on 06/13/2023 09/20/22   Lyndle Herrlich, MD  gabapentin (NEURONTIN) 300 MG capsule TAKE 1 CAPSULE BY MOUTH TWICE A DAY Patient not taking: Reported on 06/13/2023 09/29/22   Lyndle Herrlich, MD  insulin glargine (LANTUS SOLOSTAR) 100 UNIT/ML Solostar Pen INJECT 16 UNITS INTO THE SKIN AT BEDTIME. Patient not taking: Reported on 06/13/2023 10/10/22   Lyndle Herrlich, MD  metFORMIN (GLUCOPHAGE) 1000 MG tablet TAKE 1 TABLET (1,000 MG TOTAL) BY MOUTH TWICE A DAY WITH FOOD Patient not taking: Reported on 06/13/2023 10/28/22   Bensimhon, Bevelyn Buckles, MD  Multiple Vitamin (MULTIVITAMIN) tablet Take 1 tablet by mouth daily. Patient not taking: Reported on 06/13/2023    [provider]  sacubitril-valsartan (ENTRESTO) 97-103 MG Take 1 tablet by mouth 2 (two) times  daily. Patient not taking: Reported on 06/13/2023 09/20/22   Bensimhon, Bevelyn Buckles, MD  tamsulosin (FLOMAX) 0.4 MG CAPS capsule TAKE 1 CAPSULE BY MOUTH EVERYDAY AT BEDTIME Patient not taking: Reported on 06/13/2023 09/12/22   Lyndle Herrlich, MD     Critical care time: 58 min

## 2023-06-14 NOTE — Evaluation (Signed)
Occupational Therapy Evaluation Patient Details Name: Derek Thompson MRN: [id number] DOB: July 03, 1960 Today's Date: 06/14/2023   History of Present Illness   Pt is a 63 y/o M presenting to [facility] on 2/11 with 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 MRI with multifocal infarcts, septic fom gangrenous toes. PMH includes poorly controlled DM2, chronic diastolic CHF, chronic R sided heart failure, anemia of chronic disease, L bundle branch block     Clinical Impressions Pt reports ind at baseline with ADLs and functional mobility, was living in a hotel PTA and is unsure if he will have help at d/c. Pt currently with flat affect overall, but follows commands and is oriented. Pt needs up to max A for ADL, min A for bed mobility and mod+2 for standing and taking side steps to L. Pt with soft BP during session (see below). Pt presenting with impairments listed below, will follow acutely. Patient will benefit from continued inpatient follow up therapy, <3 hours/day to maximize safety/ind with ADL/functional mobility.  BP supine 102/64 (76) BP seated EOB 97/74 (83) BP post transfer 97/58 (69)      If plan is discharge home, recommend the following:   A lot of help with walking and/or transfers;A lot of help with bathing/dressing/bathroom;Assistance with cooking/housework;Direct supervision/assist for medications management;Direct supervision/assist for financial management;Assist for transportation;Help with stairs or ramp for entrance     Functional Status Assessment   Patient has had a recent decline in their functional status and demonstrates the ability to make significant improvements in function in a reasonable and predictable amount of time.     Equipment Recommendations   Other (comment) (defer)     Recommendations for Other Services   PT consult     Precautions/Restrictions   Precautions Precautions: Fall Restrictions Weight Bearing Restrictions Per Provider Order: No      Mobility Bed Mobility Overal bed mobility: Needs Assistance Bed Mobility: Supine to Sit, Sit to Supine     Supine to sit: Min assist Sit to supine: Min assist   General bed mobility comments: assist for BLEs    Transfers Overall transfer level: Needs assistance Equipment used: 2 person hand held assist Transfers: Sit to/from Stand Sit to Stand: Mod assist, +2 physical assistance                  Balance Overall balance assessment: Needs assistance Sitting-balance support: Feet supported Sitting balance-Leahy Scale: Good     Standing balance support: Reliant on assistive device for balance, During functional activity Standing balance-Leahy Scale: Poor Standing balance comment: relaint on external support                           ADL either performed or assessed with clinical judgement   ADL Overall ADL's : Needs assistance/impaired Eating/Feeding: Set up   Grooming: Set up   Upper Body Bathing: Minimal assistance   Lower Body Bathing: Maximal assistance   Upper Body Dressing : Minimal assistance   Lower Body Dressing: Maximal assistance   Toilet Transfer: +2 for physical assistance;Moderate assistance   Toileting- Clothing Manipulation and Hygiene: Minimal assistance       Functional mobility during ADLs: Minimal assistance;+2 for physical assistance       Vision Baseline Vision/History: 1 Wears glasses Additional Comments: appearing WFL     Perception Perception: Not tested       Praxis Praxis: Not tested       Pertinent Vitals/Pain Pain Assessment Pain Assessment: No/denies pain  Extremity/Trunk Assessment Upper Extremity Assessment Upper Extremity Assessment: Overall WFL for tasks assessed;Left hand dominant   Lower Extremity Assessment Lower Extremity Assessment: Defer to PT evaluation   Cervical / Trunk Assessment Cervical / Trunk Assessment: Normal   Communication Communication Communication: No apparent  difficulties   Cognition Arousal: Alert Behavior During Therapy: Flat affect Cognition: Cognition impaired   Orientation impairments: Place, Time, Person Awareness: Intellectual awareness intact     Executive functioning impairment (select all impairments): Initiation OT - Cognition Comments: pt oriented to self, place, time, following commands approrpiately but overall flat affect throughout session.                 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    OT Problem List: Decreased strength;Decreased range of motion;Decreased activity tolerance;Impaired balance (sitting and/or standing);Decreased cognition;Decreased coordination;Decreased safety awareness   OT Treatment/Interventions: Self-care/ADL training;Therapeutic exercise;Energy conservation;DME and/or AE instruction;Therapeutic activities;Balance training;Patient/family education      OT Goals(Current goals can be found in the care plan section)   Acute Rehab OT Goals Patient Stated Goal: none stated OT Goal Formulation: With patient Time For Goal Achievement: 06/28/23 Potential to Achieve Goals: Good ADL Goals Pt Will Perform Upper Body Dressing: (P) with supervision;sitting Pt Will Perform Lower Body Dressing: (P) with min assist;sitting/lateral leans;sit to/from stand Pt Will Transfer to Toilet: (P) with min  assist;ambulating;regular height toilet Pt Will Perform Tub/Shower Transfer: (P) Tub transfer;Shower transfer;with supervision;ambulating;shower seat   OT Frequency:  Min 1X/week    Co-evaluation PT/OT/SLP Co-Evaluation/Treatment: Yes Reason for Co-Treatment: Complexity of the patient's impairments (multi-system involvement);For patient/therapist safety;To address functional/ADL transfers   OT goals addressed during session: ADL's and self-care      AM-PAC OT "6 Clicks" Daily Activity     Outcome Measure Help from another person eating meals?: A Little Help from another person taking care of personal grooming?: A Little Help from another person toileting, which includes using toliet, bedpan, or urinal?: A Lot Help from another person bathing (including washing, rinsing, drying)?: A Lot Help from another person to put on and taking off regular upper body clothing?: A Little Help from another person to put on and taking off regular lower body clothing?: A Lot 6 Click Score: 15   End of Session Equipment Utilized During Treatment: Gait belt Nurse Communication: Mobility status  Activity Tolerance: Patient tolerated treatment well Patient left: in bed;with call bell/phone within reach;with bed alarm set;with nursing/sitter in room  OT Visit Diagnosis: Unsteadiness on feet (R26.81);Other abnormalities of gait and mobility (R26.89);Muscle weakness (generalized) (M62.81)                Time: 9562-1308 OT Time Calculation (min): 18 min Charges:  OT General Charges $OT Visit:  1 Visit OT Evaluation $OT Eval Moderate Complexity: 1 Mod  Alis Sawchuk K, OTD, OTR/L SecureChat Preferred Acute Rehab [phone]   Dalphine Handing 06/14/2023, 12:47 PM

## 2023-06-14 NOTE — Consult Note (Addendum)
Cardiology Consultation   Patient ID: Derek Thompson MRN: [id number]; DOB: 07/13/1960  Admit date: 06/13/2023 Date of Consult: 06/14/2023  PCP:  Rana Snare, DO   [facility] Providers Cardiologist:  [name], MD       Patient Profile:   Derek Thompson is a 63 y.o. 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 Francine Graven.  History of Present Illness:   Derek Thompson is a 63yo male with a past medical history of poorly controlled type 2 diabetes,chronic combined CHF (EF 20-25%, grade II diastolic dysfunction), CAD, HTN, history of prostate cancer. He presented to the [facility] ED on 06/13/2023 complaining of generalized weakness. He appeared weak and pale. He had a blood glucose of 548 on admission.   Patient reports progressive generalized weakness over the course of 1 week. Per chart review, the patient's family reported that they believe patient may have been slightly confused over the last two days when compared to his baseline mental status. Denied any history of recent trauma. It was reported that he recently decided to stop his Lantus 16 units at night and oral hypoglycemic agents on his own. At the same time he decided to stop his outpatient Lasix. Reporting that he was managing his diabetes with lifestyle modifications. He has a right foot infection that has been present for 2 months without any treatment.   Relevant workup in the ED includes: initial blood glucose 548, beta-hydroxybutyrate acid 1.04, BNP > 4,500, CBC showed WBC 21.8k, 94% neutrophils, hemoglobin 8.9, negative respiratory panel. VBG showed evidence of respiratory alkalosis with metabolic compensation. ED EKG showed sinus rhythm with prior LBBB, HR 95. CXR showed interstitial coarsening, potentially  representing atypical infection or edema. CT head showed potential acute or subacute infarcts in the posterior right frontal lobe, neurology recommended brain MRI.   Brain MRI showed acute/early subacute infarct in right frontal lobe with additional smaller infarcts in right cerebellum, left frontal, parietal and occipital lobes. Thought to be likely due to embolic etiology. This warranted an echocardiogram to be completed which identified large 3 x 3 cm mass at LV apex suggestive of thrombus. Echo also showed: LVEF 20-25%, global hypokinesis, grade II diastolic dysfunction, elevated LVEDP, mildly reduced RV function, mildly elevated pulmonary artery pressure, moderate MR, dilated IVC.   He was admitted for HHS, chronic combined heart failure, diabetes, encephalopathy, SIRS, CVAs, AKI, septic shock and has been consulted by various services.   Patient is likely going to need right first and second toe amputations on Friday  per ortho. Planning to have right LE angiogram tomorrow per vascular.   Patient was last seen in advanced heart failure clinic by Dr. Gala Romney for follow up. He reported feeling okay at this visit, dry weight 170-175, reported no issues with medication compliance. At this time his treatment plan included: spironolactone 25 mg daily, digoxin 0.125 mg daily, Entresto 97-103 mg BID, Lasix 20 mg PRN, Toprol 25 mg daily, Lipitor 40 mg daily. At this visit they discussed stopping SGLT2i due to poorly controlled diabetes, as well as considering CRT-D in future when diabetes has shown improvement.   After speaking with the patient, he confirms the history as stated above. He is lethargic but still able to answer my questions. He says that he believes he was always taking  Lasix and digoxin but he cannot be sure. This past autumn is when he began to stop some medications for his CHF and DM. Right now he denies any chest pain or shortness of breath.  Past Medical History:  Diagnosis Date    Anemia    Diabetes mellitus without complication (HCC) 1987   Family history of breast cancer    Hypertension    Myocardial infarction [facility])    Prostate cancer [facility])    Past Surgical History:  Procedure Laterality Date   DENTAL SURGERY     RADIOACTIVE SEED IMPLANT N/A 02/04/2021   Procedure: RADIOACTIVE SEED IMPLANT/BRACHYTHERAPY IMPLANT;  Surgeon: Jannifer Hick, MD;  Location: [facility] ORS;  Service: Urology;  Laterality: N/A;   RIGHT/LEFT HEART CATH AND CORONARY ANGIOGRAPHY N/A 05/25/2020   Procedure: RIGHT/LEFT HEART CATH AND CORONARY ANGIOGRAPHY;  Surgeon: [place], Peter M, MD;  Location: [facility] INVASIVE CV LAB;  Service: Cardiovascular;  Laterality: N/A;   RIGHT/LEFT HEART CATH AND CORONARY ANGIOGRAPHY N/A 10/14/2022   Procedure: RIGHT/LEFT HEART CATH AND CORONARY ANGIOGRAPHY;  Surgeon: Dolores Patty, MD;  Location: [facility] INVASIVE CV LAB;  Service: Cardiovascular;  Laterality: N/A;   SPACE OAR INSTILLATION N/A 02/04/2021   Procedure: SPACE OAR INSTILLATION;  Surgeon: Jannifer Hick, MD;  Location: [facility] ORS;  Service: Urology;  Laterality: N/A;    Home Medications:  Prior to Admission medications   Medication Sig Start Date End Date Taking? Authorizing Provider  Accu-Chek Softclix Lancets lancets SMARTSIG:Topical 1-4 Times Daily 08/20/22  Yes Atway, Rayann N, DO  aspirin EC 81 MG tablet Take 1 tablet (81 mg total) by mouth daily. Swallow whole. 09/07/22 09/07/23 Yes Simmons, Brittainy M, PA-C  blood glucose meter kit and supplies KIT Dispense based on patient and insurance preference. Use up to four times daily as directed. (FOR ICD-9 250.00, 250.01). 08/20/22  Yes Atway, Rayann N, DO  digoxin (LANOXIN) 0.125 MG tablet Take 1 tablet (0.125 mg total) by mouth daily. 09/07/22  Yes Robbie Lis M, PA-C  ezetimibe (ZETIA) 10 MG tablet Take 1 tablet (10 mg total) by mouth daily. 11/25/22  Yes Rana Snare, DO  furosemide (LASIX) 20 MG tablet Take 1 tablet (20 mg total) by mouth daily. 10/05/22 10/05/23 Yes  Bensimhon, Bevelyn Buckles, MD  glucose blood (CONTOUR NEXT TEST) test strip Use to check blood sugar up to 3 times daily as instructed 11/22/22  Yes Rana Snare, DO  ibuprofen (ADVIL) 200 MG tablet Take 400 mg by mouth every 6 (six) hours as needed for headache or mild pain (pain score 1-3).   Yes [provider]  metoprolol succinate (TOPROL-XL) 25 MG 24 hr tablet TAKE 1 TABLET BY MOUTH DAILY 09/12/22  Yes Lyndle Herrlich, MD  spironolactone (ALDACTONE) 25 MG tablet Take 1 tablet (25 mg total) by mouth daily. 10/05/22  Yes Bensimhon, Bevelyn Buckles, MD  atorvastatin (LIPITOR) 40 MG tablet TAKE 1 TABLET BY MOUTH EVERY DAY Patient not taking: Reported on 06/13/2023 09/12/22   Lyndle Herrlich, MD  empagliflozin (JARDIANCE) 10 MG TABS tablet Take 1 tablet (10 mg total) by mouth daily before breakfast. Patient not taking: Reported on 06/13/2023 11/22/22   Rana Snare, DO  ferrous sulfate 325 (65 FE) MG tablet TAKE 1 TABLET BY MOUTH EVERY DAY Patient not taking: Reported on 06/13/2023 09/20/22   Lyndle Herrlich, MD  gabapentin (NEURONTIN) 300 MG capsule TAKE 1 CAPSULE BY MOUTH TWICE A DAY Patient not taking: Reported on 06/13/2023 09/29/22   Lyndle Herrlich, MD  insulin glargine (LANTUS  SOLOSTAR) 100 UNIT/ML Solostar Pen INJECT 16 UNITS INTO THE SKIN AT BEDTIME. Patient not taking: Reported on 06/13/2023 10/10/22   Lyndle Herrlich, MD  metFORMIN (GLUCOPHAGE) 1000 MG tablet TAKE 1 TABLET (1,000 MG TOTAL) BY MOUTH TWICE A DAY WITH FOOD Patient not taking: Reported on 06/13/2023 10/28/22   Bensimhon, Bevelyn Buckles, MD  Multiple Vitamin (MULTIVITAMIN) tablet Take 1 tablet by mouth daily. Patient not taking: Reported on 06/13/2023    [provider]  sacubitril-valsartan (ENTRESTO) 97-103 MG Take 1 tablet by mouth 2 (two) times daily. Patient not taking: Reported on 06/13/2023 09/20/22   Bensimhon, Bevelyn Buckles, MD  tamsulosin (FLOMAX) 0.4 MG CAPS capsule TAKE 1 CAPSULE BY MOUTH  EVERYDAY AT BEDTIME Patient not taking: Reported on 06/13/2023 09/12/22   Lyndle Herrlich, MD   Inpatient Medications: Scheduled Meds:  [START ON 06/15/2023]  stroke: early stages of recovery book   Does not apply Once   aspirin EC  81 mg Oral Daily   atorvastatin  80 mg Oral Daily   Chlorhexidine Gluconate Cloth  6 each Topical Daily   insulin aspart  0-9 Units Subcutaneous Q4H   [START ON 06/15/2023] insulin glargine-yfgn  12 Units Subcutaneous Daily   metroNIDAZOLE  500 mg Oral Q12H   [START ON 06/15/2023] pantoprazole  40 mg Oral Daily   phosphorus  500 mg Oral Q4H   sodium chloride flush  10-40 mL Intracatheter Q12H   Continuous Infusions:  sodium chloride 250 mL (06/14/23 0540)   ceFEPime (MAXIPIME) IV 2 g (06/14/23 1049)   heparin 16 Units/kg/hr (06/14/23 1510)   norepinephrine (LEVOPHED) Adult infusion 3 mcg/min (06/14/23 0826)   vancomycin 1,250 mg (06/14/23 1040)   PRN Meds: acetaminophen **OR** acetaminophen, dextrose, ondansetron (ZOFRAN) IV, mouth rinse, sodium chloride flush  Allergies:   No Known Allergies  Social History:   Social History   Socioeconomic History   Marital status: Divorced    Spouse name: Not on file   Number of children: 3   Years of education: Not on file   Highest education level: Associate degree: academic program  Occupational History   Occupation: Training and development officer  Tobacco Use   Smoking status: Former    Current packs/day: 0.00    Average packs/day: 1 pack/day for 43.0 years (43.0 ttl pk-yrs)    Types: Cigarettes    Start date: 05/22/1974    Quit date: 05/22/2017    Years since quitting: 6.0   Smokeless tobacco: Never   Tobacco comments:    Quit 2019 about 1PPD  Vaping Use   Vaping status: Never Used  Substance and Sexual Activity   Alcohol use: Not Currently    Comment: last drink 2015   Drug use: Not Currently    Comment: none in 7 years   Sexual activity: Not Currently  Other Topics Concern   Not on file  Social History  Narrative   3 sons   Social Drivers of Corporate investment banker Strain: Low Risk  (08/19/2022)   Overall Financial Resource Strain (CARDIA)    Difficulty of Paying Living Expenses: Not very hard  Food Insecurity: No Food Insecurity (06/14/2023)   Hunger Vital Sign    Worried About Running Out of Food in the Last Year: Never true    Ran Out of Food in the Last Year: Never true  Transportation Needs: No Transportation Needs (06/14/2023)   PRAPARE - Administrator, Civil Service (Medical): No    Lack of Transportation (Non-Medical): No  Physical Activity:  Not on file  Stress: Not on file  Social Connections: Not on file  Intimate Partner Violence: Not At Risk (06/14/2023)   Humiliation, Afraid, Rape, and Kick questionnaire    Fear of Current or Ex-Partner: No    Emotionally Abused: No    Physically Abused: No    Sexually Abused: No    Family History:   Family History  Problem Relation Age of Onset   Breast cancer Mother        dx 30s/40s, twice   Cancer Maternal Grandfather        unknown type, d. >50    ROS:  Please see the history of present illness.  All other ROS reviewed and negative.     Physical Exam/Data:   Vitals:   06/14/23 1330 06/14/23 1345 06/14/23 1400 06/14/23 1518  BP: 102/67 97/64 98/67    Pulse: 87 85 85   Resp:      Temp:    97.8 F (36.6 C)  TempSrc:    Oral  SpO2: 100% 99% 99%   Weight:      Height:        Intake/Output Summary (Last 24 hours) at 06/14/2023 1540 Last data filed at 06/14/2023 0600 Gross per 24 hour  Intake 1609.94 ml  Output 150 ml  Net 1459.94 ml      06/14/2023    5:20 AM 06/13/2023    6:51 PM 11/22/2022    1:27 PM  Last 3 Weights  Weight (lbs) 170 lb 13.7 oz 178 lb 178 lb 12.8 oz  Weight (kg) 77.5 kg 80.74 kg 81.103 kg     Body mass index is 23.17 kg/m.  General:  somnolent, in no acute distress HEENT: normal Neck: no JVD Vascular: 2+ femoral pulses  Cardiac:  normal S1, S2; RRR; no murmur  Lungs:   clear to auscultation bilaterally Abd: soft, nontender Ext: no edema, wet gangrene of right second toe Musculoskeletal:  generalized weakness  Skin: warm and dry  Neuro:  no focal abnormalities noted, patient appears lethargic  Psych:  Normal affect   EKG:  The EKG was personally reviewed and demonstrates: sinus rhythm, HR 95, known LBBB  Telemetry:  Telemetry was personally reviewed and demonstrates:  sinus rhythm, HR 80s, frequent PVCs, episodes of NSVT  Relevant CV Studies: Echocardiogram 06/14/2023 IMPRESSIONS   1. Large 3 x 3 cm round heterogenous mass at the LV apex which is  suggestive of thrombus given severe LV dysfunction and less likely myxoma  - the mass did not take up Definity contrast (appears avascular). Left  ventricular ejection fraction, by  estimation, is 20 to 25%. Left ventricular ejection fraction by 2D MOD  biplane is 21.2 %. The left ventricle has severely decreased function. The  left ventricle demonstrates global hypokinesis. The left ventricular  internal cavity size was mildly  dilated. Left ventricular diastolic parameters are consistent with Grade  II diastolic dysfunction (pseudonormalization). Elevated left ventricular  end-diastolic pressure.   2. Right ventricular systolic function is mildly reduced. The right  ventricular size is normal. There is mildly elevated pulmonary artery  systolic pressure. The estimated right ventricular systolic pressure is  39.0 mmHg.   3. The mitral valve is abnormal. Moderate mitral valve regurgitation.   4. The aortic valve is tricuspid. Aortic valve regurgitation is not  visualized.   5. The inferior vena cava is dilated in size with <50% respiratory  variability, suggesting right atrial pressure of 15 mmHg.   Comparison(s): Changes from prior study are  noted. 08/16/2022: LVEF 25%,  global hypokinesis.   Laboratory Data:  High Sensitivity Troponin:   Recent Labs  Lab 06/14/23 1405  TROPONINIHS 66*      Chemistry Recent Labs  Lab 06/13/23 1939 06/13/23 1958 06/13/23 1959 06/13/23 2228 06/14/23 0452  NA 126*   < > 127* 127* 130*  K 4.1   < > 4.1 3.8 3.9  CL 91*  --  95* 97* 99  CO2 19*  --   --  19* 13*  GLUCOSE 545*  --  564* 479* 195*  BUN 19  --  21 20 20   CREATININE 1.98*  --  1.90* 1.80* 1.84*  CALCIUM 8.5*  --   --  7.7* 8.0*  MG  --   --   --  1.5* 2.3  GFRNONAA 37*  --   --  42* 41*  ANIONGAP 16*  --   --  11 18*   < > = values in this interval not displayed.    Recent Labs  Lab 06/13/23 1939 06/14/23 0452  PROT 6.7 6.0*  ALBUMIN 2.1* 1.8*  AST 29 26  ALT 27 22  ALKPHOS 49 41  BILITOT 0.7 0.6   Lipids No results for input(s): "CHOL", "TRIG", "HDL", "LABVLDL", "LDLCALC", "CHOLHDL" in the last 168 hours.  Hematology Recent Labs  Lab 06/13/23 1940 06/13/23 1958 06/13/23 1959  WBC 21.8*  --   --   RBC 3.19*  --   --   HGB 8.9* 10.2* 10.2*  HCT 27.9* 30.0* 30.0*  MCV 87.5  --   --   MCH 27.9  --   --   MCHC 31.9  --   --   RDW 16.8*  --   --   PLT 195  --   --    Thyroid  Recent Labs  Lab 06/13/23 1939  TSH 5.642*    BNP Recent Labs  Lab 06/13/23 1937  BNP >4,500.0*    DDimer No results for input(s): "DDIMER" in the last 168 hours.  Radiology/Studies:  US RENAL Result Date: 06/14/2023 CLINICAL DATA:  [id number] Encephalopathy acute [id number] EXAM: RENAL / URINARY TRACT ULTRASOUND COMPLETE COMPARISON:  09/10/2020 FINDINGS: Right Kidney: Renal measurements: 11.8 x 4.9 x 4.8 cm = volume: 143 mL. Echogenicity within normal limits. No mass or hydronephrosis visualized. Left Kidney: Renal measurements: 10.6 x 4.4 x 4.3 cm = volume: 104 mL. Echogenicity within normal limits. No mass or hydronephrosis visualized. Bladder: Abnormally thickened appearance of the urinary bladder wall. Linear area of increased echogenicity anteriorly within the bladder with dirty posterior acoustic shadowing. Other: Small bilateral pleural effusions. IMPRESSION: 1. Abnormally  thickened appearance of the urinary bladder wall. Linear area of increased echogenicity anteriorly within the bladder with dirty posterior acoustic shadowing. This may represent air within the bladder lumen, which could be secondary to recent instrumentation or infection. Air within the bladder wall (emphysematous cystitis) is not excluded. Correlation with urinalysis is recommended. 2. Unremarkable sonographic appearance of the kidneys. 3. Small bilateral pleural effusions. Electronically Signed   By: Duanne Guess D.O.   On: 06/14/2023 13:40   ECHOCARDIOGRAM COMPLETE Result Date: 06/14/2023    ECHOCARDIOGRAM REPORT   Patient Name:   Derek Thompson Date of Exam: 06/14/2023 Medical Rec #:  [id number]     Height:       72.0 in Accession #:    [id number]    Weight:       170.9 lb Date of Birth:  Oct 24, 1960  BSA:          1.993 m Patient Age:    62 years      BP:           100/66 mmHg Patient Gender: M             HR:           89 bpm. Exam Location:  Inpatient Procedure: 2D Echo, Cardiac Doppler, Color Doppler and Intracardiac            Opacification Agent (Both Spectral and Color Flow Doppler were            utilized during procedure). Indications:    Stroke, shock  History:        Patient has prior history of Echocardiogram examinations, most                 recent 08/19/2022. CHF, CAD; Risk Factors:Hypertension, Diabetes,                 Dyslipidemia and Former Smoker.  Sonographer:    Vern Claude Referring Phys: Lavone Neri OPYD IMPRESSIONS  1. Large 3 x 3 cm round heterogenous mass at the LV apex which is suggestive of thrombus given severe LV dysfunction and less likely myxoma - the mass did not take up Definity contrast (appears avascular). Left ventricular ejection fraction, by estimation, is 20 to 25%. Left ventricular ejection fraction by 2D MOD biplane is 21.2 %. The left ventricle has severely decreased function. The left ventricle demonstrates global hypokinesis. The left ventricular internal cavity  size was mildly dilated. Left ventricular diastolic parameters are consistent with Grade II diastolic dysfunction (pseudonormalization). Elevated left ventricular end-diastolic pressure.  2. Right ventricular systolic function is mildly reduced. The right ventricular size is normal. There is mildly elevated pulmonary artery systolic pressure. The estimated right ventricular systolic pressure is 39.0 mmHg.  3. The mitral valve is abnormal. Moderate mitral valve regurgitation.  4. The aortic valve is tricuspid. Aortic valve regurgitation is not visualized.  5. The inferior vena cava is dilated in size with <50% respiratory variability, suggesting right atrial pressure of 15 mmHg. Comparison(s): Changes from prior study are noted. 08/16/2022: LVEF 25%, global hypokinesis. Conclusion(s)/Recommendation(s): Critical findings reported to Dr. Francine Graven and Elmer Picker, NP and acknowledged at 12:23 pm on 06/14/23. FINDINGS  Left Ventricle: Large 3 x 3 cm round heterogenous mass at the LV apex which is suggestive of thrombus given severe LV dysfunction and less likely myxoma - the mass did not take up Definity contrast (appears avascular). Left ventricular ejection fraction, by estimation, is 20 to 25%. Left ventricular ejection fraction by 2D MOD biplane is 21.2 %. The left ventricle has severely decreased function. The left ventricle demonstrates global hypokinesis. Definity contrast agent was given IV to delineate the left ventricular endocardial borders. Strain imaging was not performed. The left ventricular internal cavity size was mildly dilated. There is no left ventricular hypertrophy. Left ventricular diastolic parameters are consistent with Grade II diastolic dysfunction (pseudonormalization). Elevated left ventricular end-diastolic pressure. Right Ventricle: The right ventricular size is normal. No increase in right ventricular wall thickness. Right ventricular systolic function is mildly reduced. There is mildly  elevated pulmonary artery systolic pressure. The tricuspid regurgitant velocity  is 2.45 m/s, and with an assumed right atrial pressure of 15 mmHg, the estimated right ventricular systolic pressure is 39.0 mmHg. Left Atrium: Left atrial size was normal in size. Right Atrium: Right atrial size was normal in size. Pericardium: There is  no evidence of pericardial effusion. Mitral Valve: The mitral valve is abnormal. There is mild thickening of the anterior and posterior mitral valve leaflet(s). Moderate mitral valve regurgitation. MV peak gradient, 4.0 mmHg. The mean mitral valve gradient is 2.0 mmHg. Tricuspid Valve: The tricuspid valve is grossly normal. Tricuspid valve regurgitation is mild. Aortic Valve: The aortic valve is tricuspid. Aortic valve regurgitation is not visualized. Aortic valve mean gradient measures 2.0 mmHg. Aortic valve peak gradient measures 3.3 mmHg. Aortic valve area, by VTI measures 2.01 cm. Pulmonic Valve: The pulmonic valve was grossly normal. Pulmonic valve regurgitation is trivial. Aorta: The aortic root and ascending aorta are structurally normal, with no evidence of dilitation. Venous: The inferior vena cava is dilated in size with less than 50% respiratory variability, suggesting right atrial pressure of 15 mmHg. IAS/Shunts: No atrial level shunt detected by color flow Doppler. Additional Comments: 3D imaging was not performed.  LEFT VENTRICLE PLAX 2D                        Biplane EF (MOD) LVIDd:         5.50 cm         LV Biplane EF:   Left LVIDs:         4.80 cm                          ventricular LV PW:         0.70 cm                          ejection LV IVS:        0.60 cm                          fraction by LVOT diam:     1.80 cm                          2D MOD LV SV:         29                               biplane is LV SV Index:   14                               21.2 %. LVOT Area:     2.54 cm                                Diastology                                LV e'  medial:    5.98 cm/s LV Volumes (MOD)               LV E/e' medial:  16.1 LV vol d, MOD    226.1 ml      LV e' lateral:   9.03 cm/s A2C:                           LV E/e' lateral: 10.7 LV vol d, MOD  185.9 ml A4C: LV vol s, MOD    152.1 ml A2C: LV vol s, MOD    167.2 ml A4C: LV SV MOD A2C:   74.0 ml LV SV MOD A4C:   185.9 ml LV SV MOD BP:    47.0 ml RIGHT VENTRICLE            IVC RV Basal diam:  4.90 cm    IVC diam: 2.50 cm RV Mid diam:    3.50 cm RV S prime:     9.14 cm/s LEFT ATRIUM             Index        RIGHT ATRIUM           Index LA diam:        3.90 cm 1.96 cm/m   RA Area:     18.60 cm LA Vol (A2C):   70.7 ml 35.48 ml/m  RA Volume:   55.90 ml  28.05 ml/m LA Vol (A4C):   57.7 ml 28.96 ml/m LA Biplane Vol: 67.4 ml 33.82 ml/m  AORTIC VALVE                    PULMONIC VALVE AV Area (Vmax):    2.03 cm     PV Vmax:          0.79 m/s AV Area (Vmean):   1.54 cm     PV Peak grad:     2.5 mmHg AV Area (VTI):     2.01 cm     PR End Diast Vel: 8.12 msec AV Vmax:           90.60 cm/s AV Vmean:          68.900 cm/s AV VTI:            0.142 m AV Peak Grad:      3.3 mmHg AV Mean Grad:      2.0 mmHg LVOT Vmax:         72.40 cm/s LVOT Vmean:        41.800 cm/s LVOT VTI:          0.112 m LVOT/AV VTI ratio: 0.79  AORTA Ao Root diam: 3.00 cm Ao Asc diam:  2.50 cm MITRAL VALVE                 TRICUSPID VALVE MV Area (PHT): 5.20 cm      TR Peak grad:   24.0 mmHg MV Area VTI:   1.40 cm      TR Vmax:        245.00 cm/s MV Peak grad:  4.0 mmHg MV Mean grad:  2.0 mmHg      SHUNTS MV Vmax:       1.00 m/s      Systemic VTI:  0.11 m MV Vmean:      56.4 cm/s     Systemic Diam: 1.80 cm MV Decel Time: 146 msec MR Peak grad:   65.0 mmHg MR Mean grad:   42.5 mmHg MR Vmax:        403.00 cm/s MR Vmean:       307.5 cm/s MR PISA:        1.57 cm MR PISA Radius: 0.50 cm MV E velocity: 96.50 cm/s MV A velocity: 44.60 cm/s MV E/A ratio:  2.16 Zoila Shutter MD Electronically signed by Zoila Shutter MD Signature Date/Time:  06/14/2023/12:32:48 PM    Final    VAS [place] ABI WITH/WO TBI  Result Date: 06/14/2023  LOWER EXTREMITY DOPPLER STUDY Patient Name:  Derek Thompson  Date of Exam:   06/14/2023 Medical Rec #: [id number]      Accession #:    [id number] Date of Birth: [birth year]      Patient Gender: M Patient Age:   25 years Exam Location:  [facility] Procedure:      VAS [place] ABI WITH/WO TBI Referring Phys: MICHAEL JEFFERY --------------------------------------------------------------------------------  Indications: Ulceration, and gangrene. High Risk Factors: Diabetes, past history of smoking.  Comparison Study: No prior exam. Performing Technologist: Fernande Bras  Examination Guidelines: A complete evaluation includes at minimum, Doppler waveform signals and systolic blood pressure reading at the level of bilateral brachial, anterior tibial, and posterior tibial arteries, when vessel segments are accessible. Bilateral testing is considered an integral part of a complete examination. Photoelectric Plethysmograph (PPG) waveforms and toe systolic pressure readings are included as required and additional duplex testing as needed. Limited examinations for reoccurring indications may be performed as noted.  ABI Findings: +---------+------------------+-----+----------+--------+ Right    Rt Pressure (mmHg)IndexWaveform  Comment  +---------+------------------+-----+----------+--------+ Brachial 110                    triphasic          +---------+------------------+-----+----------+--------+ PTA      111               1.01 monophasic         +---------+------------------+-----+----------+--------+ DP       118               1.07 biphasic           +---------+------------------+-----+----------+--------+ Great Toe203               1.85 Abnormal           +---------+------------------+-----+----------+--------+ +---------+------------------+-----+---------+-------+ Left     Lt Pressure  (mmHg)IndexWaveform Comment +---------+------------------+-----+---------+-------+ Brachial 110                    triphasic        +---------+------------------+-----+---------+-------+ PTA      116               1.05 triphasic        +---------+------------------+-----+---------+-------+ DP       109               0.99 biphasic         +---------+------------------+-----+---------+-------+ Great Toe71                0.65 Abnormal         +---------+------------------+-----+---------+-------+ +-------+-----------+-----------+------------+------------+ ABI/TBIToday's ABIToday's TBIPrevious ABIPrevious TBI +-------+-----------+-----------+------------+------------+ Right  1.07       1.85                                +-------+-----------+-----------+------------+------------+ Left   1.05       0.65                                +-------+-----------+-----------+------------+------------+  Summary: Right: Resting right ankle-brachial index is within normal range. The right toe-brachial index is abnormal. Left: Resting left ankle-brachial index is within normal range. The left toe-brachial index is abnormal. *See table(s) above for measurements and observations.     Preliminary    DG CHEST PORT 1 VIEW Result Date: 06/14/2023 CLINICAL DATA:  Encounter for central line placement EXAM: PORTABLE CHEST 1 VIEW COMPARISON:  Yesterday FINDINGS: Left IJ line with tip at the upper cavoatrial junction. No pneumothorax or new mediastinal widening. There is stable cardiomegaly and bilateral hazy opacification of the chest. Layering pleural fluid is possible. Artifact from EKG leads. IMPRESSION: 1. New central line without complicating feature. 2. Stable aeration Electronically Signed   By: Tiburcio Pea M.D.   On: 06/14/2023 07:28   DG Foot 2 Views Right Result Date: 06/14/2023 CLINICAL DATA:  63 year old male with history of infection of the first and second toes of the right  foot. EXAM: RIGHT FOOT - 2 VIEW COMPARISON:  No priors. FINDINGS: Two views of the right foot demonstrate lucent areas of osseous destruction involving the tuft of the distal phalanx of the great toe, as well as in the second toe involving both the distal and mid phalanges centered around the D IP joint. Overlying soft tissues are irregular with probable soft tissue gas. No other definite osseous lesions are noted. No acute displaced fracture or dislocation. IMPRESSION: 1. 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 Electronically Signed   By: Trudie Reed M.D.   On: 06/14/2023 06:42   MR BRAIN WO CONTRAST Result Date: 06/14/2023 CLINICAL DATA:  Mental status change, unknown cause EXAM: MRI HEAD WITHOUT CONTRAST TECHNIQUE: Multiplanar, multiecho pulse sequences of the brain and surrounding structures were obtained without intravenous contrast. COMPARISON:  CT head from today. FINDINGS: Brain: Acute right cerebellar infarct. Many small acute infarcts throughout the left frontal, parietal and occipital lobes. More confluent/larger acute or early subacute infarct in the posterior right frontal lobe. No mass occupying acute hemorrhage or midline shift. Remote cerebellar infarcts. No hydrocephalus. Vascular: Major arterial flow voids are maintained at the skull base. Skull and upper cervical spine: Normal marrow signal. Sinuses/Orbits: Clear sinuses.  No acute orbital findings. IMPRESSION: Acute or early subacute confluent infarct in the right frontal lobe. Additional smaller acute infarcts in the right cerebellum, left frontal, parietal and occipital lobes. Given involvement of multiple vascular territories, consider an embolic etiology. Electronically Signed   By: Feliberto Harts M.D.   On: 06/14/2023 02:17   DG Chest Portable 1 View Result Date: 06/13/2023 CLINICAL DATA:  Cough,  altered mental status EXAM: PORTABLE CHEST 1 VIEW COMPARISON:  08/18/2022 FINDINGS: Stable cardiomegaly. Aortic atherosclerotic calcification. Diffuse interstitial coarsening greatest in the mid and lower lungs bilaterally. No focal pneumonia. No pleural effusion or pneumothorax. IMPRESSION: Cardiomegaly with interstitial coarsening which may be due to edema or atypical infection. Electronically Signed   By: Minerva Fester M.D.   On: 06/13/2023 21:10   CT HEAD WO CONTRAST ( ) Result Date: 06/13/2023 CLINICAL DATA:  Neuro deficit, acute, stroke suspected Dizziness, altered mental status EXAM: CT HEAD WITHOUT CONTRAST TECHNIQUE: Contiguous axial images were obtained from the base of the skull through the vertex without intravenous contrast. RADIATION DOSE REDUCTION: This exam was performed according to the departmental dose-optimization program which includes automated exposure control, adjustment of the mA and/or kV according to patient size and/or use of iterative reconstruction technique. COMPARISON:  None Available. FINDINGS: Brain: Probably acute or subacute infarcts in the posterior right frontal lobe. Possible slight petechial hemorrhage without mass occupying acute hemorrhage. No significant mass effect. Midline shift. No hydrocephalus. No visible mass lesion. Remote bilateral cerebellar infarcts. Vascular: No hyperdense vessel.  Calcific atherosclerosis. Skull: No acute  fracture. Sinuses/Orbits: Clear sinuses.  No acute orbital findings. Other: No mastoid effusions. IMPRESSION: 1. Probably acute or subacute infarcts in the posterior right frontal lobe. Recommend MRI. 2. Possible slight petechial hemorrhage without mass occupying acute hemorrhage. 3. No significant mass effect. Electronically Signed   By: Feliberto Harts M.D.   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 Echo this admission showed: EF 20-25%, global hypokinesis, grade II  diastolic dysfunction, mildly reduced RV function, mildly elevated pulmonary artery pressure, elevated LVEDP, moderate MR, dilated IVC with 3x3 cm mass at LV apex suggestive of thrombus   Patient has been hypotensive, most recent 98/67 Patient undergoing multiple interventions with ortho and vascular in near future; right first and second toe amputations on Friday per ortho and right LE angiogram tomorrow per vascular GDMT mostly held due to hypotension  Consider adding back on home GDMT once BP stabilizes  Patient started on IV heparin for thrombus   Hypertension Nonobstructive CAD  Patient has been hypotensive, most recent 98/67 GDMT mostly held due to hypotension  Continue ASA 81 mg daily Continue Lipitor 80 mg daily  Per primary Septic shock Right LE gangrene Leukocytosis Acute/early subacute CVAs HHS Poorly controlled T2DM Lactic acidosis  AKI Anemia  Electrolyte disturbances   Risk Assessment/Risk Scores:  New [place] Heart Association (NYHA) Functional Class NYHA Class II For questions or updates, please contact [facility] Please consult [email or web address] for contact info under  Signed, Olena Leatherwood, PA-C  06/14/2023 3:40 PM  Patient seen and examined, note reviewed with the signed Advanced Practice Provider. I personally reviewed laboratory data, imaging studies and relevant notes. I independently examined the patient and formulated the important aspects of the plan. I have personally discussed the plan with the patient and/or family. Comments or changes to the note/plan are indicated below.  Patient seen and examined at his bedside.    63 y.o. male with a hx of poorly controlled type 2 diabetes,chronic combined CHF (EF 20-25%, grade II diastolic dysfunction), CAD, HTN, history of prostate cancer who is being seen 06/14/2023 for the evaluation of large LV apex thrombus   GEN:  Well nourished, well developed in no acute distress HEENT: Mucous membranes moist,  good dentition NECK: No JVD; No carotid bruits LYMPHATICS: No lymphadenopathy CARDIAC: S1S2 noted, RRR, no murmurs, rubs, gallops RESPIRATORY:  Clear to auscultation without rales, wheezing or rhonchi  ABDOMEN: Soft, non-tender, non-distended, bowel sounds noted, no guarding EXTREMITIES:No cyanosis, no cyanosis, no clubbing MUSCULOSKELETAL: No deformity  SKIN: Warm and dry NEUROLOGIC:  Alert and oriented x 3, nonfocal PSYCHIATRIC:  Normal affect, good insight  Assessment and plan:  Septic shock Right left gangrene History of CVA HHS/uncontrolled diabetes Chronic heart failure with reduced ejection fraction Left ventricular apical thrombus  He is currently on Levophed, for septic shock and is on broad-spectrum antibiotics per the ICU team for vancomycin and cefepime.  For his infection.  Pending echo intervention in the upcoming days.  In terms of his cardiovascular conditions clinically he does not appear to be in heart failure.  Known significantly depressed ejection fraction giving back unfortunately cannot start patient back on his guideline directed medical therapy.  Agree with heparin drip in the setting of his left ventricular thrombus. Thankfully no anginal symptom.  The patient does not have any unstable cardiac conditions.  Upon evaluation today, he can achieve 4 METs or greater without anginal symptoms.  According to [facility] and AHA guidelines,  he requires no further cardiac workup prior to his noncardiac surgery and should be at acceptable risk. Our service is available as necessary in the perioperative period.   Thomasene Ripple DO, MS [facility] Attending Cardiologist [facility]  [address] [place], [name] [address] [phone] Website: [email or web address]

## 2023-06-14 NOTE — Procedures (Signed)
Central Venous Catheter Insertion Procedure Note  Derek Thompson  [id number]  08/19/1960  Date:06/14/23  Time:6:54 AM   Provider Performing:Jakobie Henslee Judie Petit Lonzo Candy   Procedure: Insertion of Non-tunneled Central Venous [phone]) with US guidance ([id number])   Indication(s) Medication administration  Consent Risks of the procedure as well as the alternatives and risks of each were explained to the patient and/or caregiver.  Consent for the procedure was obtained and is signed in the bedside chart  Anesthesia Topical only with 1% lidocaine   Timeout Verified patient identification, verified procedure, site/side was marked, verified correct patient position, special equipment/implants available, medications/allergies/relevant history reviewed, required imaging and test results available.  Sterile Technique Maximal sterile technique including full sterile barrier drape, hand hygiene, sterile gown, sterile gloves, mask, hair covering, sterile ultrasound probe cover (if used).  Procedure Description Area of catheter insertion was cleaned with chlorhexidine and draped in sterile fashion.  With real-time ultrasound guidance a central venous catheter was placed into the left internal jugular vein. Nonpulsatile blood flow and easy flushing noted in all ports.  The catheter was sutured in place and sterile dressing applied.  Complications/Tolerance None; patient tolerated the procedure well. Chest X-ray is ordered to verify placement for internal jugular or subclavian cannulation.   Chest x-ray is not ordered for femoral cannulation.  EBL Minimal  Specimen(s) None

## 2023-06-14 NOTE — Progress Notes (Addendum)
NAMEBruce Thompson, MRN:  [id number], DOB:  Dec 30, 1960, LOS: 1 ADMISSION DATE:  06/13/2023, CONSULTATION DATE:  06/14/2023 REFERRING MD:  Derek Thompson, CHIEF COMPLAINT:  Shock    History of Present Illness:  Derek Thompson is a 63 y.o M w/ PMHx of poorly controlled T2DM, HFrEF (LVEF 25%), LBBB, G2DD and LV global hypokinesis, HTN, CAD, Prostate Cancer treated w/ radiation 2022 who presented to ED for 1 week of progressive generalized weakness, 2 days AMS, admitted for suspected HHS.   Initial labs showed CBG 548, beta-hydroxybutyrate acid 1.04. BNP >4,500, WBC 21.8 w/ Abs Neut 20.5, Hgb A1c 13.3, lactic acid 2.6, Cr 1.98, Serum osmolality 302. VBG: 7.48/27.9/21.1.   Rapid Response called for symptomatic hypotension. Started to Levophed gtt with fluids. PCCM consulted.   Pertinent  Medical History  Poorly controlled DM-2 (HbA1c 13.3%), CAD, CHF (systolic and diastolic, EF 25%, G2DD), anemia of chronic illness illness, HTN   Significant Hospital Events: Including procedures, antibiotic start and stop dates in addition to other pertinent events   Given Rocephin, Zithromax, 2.6 L (NS 0.9% and LR), insulin drip. Started on Levophed for hypotension 4:44 am today, 2 mcg/min now R foot xray showed osteomyelitis great toe and 2nd toe, orthopedics consulted. Vancomycin started with addition of Cefepime and Flagyl  ECHO showed large LV apical thrombus, cardiology consulted.  ABIs abnormal, VSS consulted   Interim History / Subjective:  Laying in bed, comfortably. AO x 4. Denies any chest pain or shortness of breath. Denies any weakness. Denies any abdominal pain. Reports right foot gangrene started about two months ago.   Objective   Blood pressure 104/67, pulse 89, temperature 98 F (36.7 C), temperature source Oral, resp. rate 13, height 6' (1.829 m), weight 77.5 kg, SpO2 100%.        Intake/Output Summary (Last 24 hours) at 06/14/2023 0948 Last data filed at 06/14/2023 0600 Gross per 24  hour  Intake 1609.94 ml  Output 150 ml  Net 1459.94 ml   Filed Weights   06/13/23 1851 06/14/23 0520  Weight: 80.7 kg 77.5 kg    Examination: General: Laying in bed, on room air.  HENT: Atraumatic, normocephalic.  Lungs: clear to auscultation bilaterally  Cardiovascular: normal rate, no murmur.  Abdomen: soft, NT, ND.  Extremities: +R second toe wet gangrene with malodorous scent. +Discoloration of the distal RLE Neuro: Alert and oriented to self, DOB, month, location and event. No focal neuro deficits.  GU: not assessed.   CTH: probably acute or subacute infarcts in the posterior right frontal lobe. Possible slight petechial hemorrhage without mass occupying acute Hemorrhage MR brain: Acute or early subacute confluent infarct in the right frontal lobe. Additional smaller acute infarcts in the right cerebellum, left frontal, parietal and occipital lobes. Given involvement of multiple vascular territories, consider an embolic etiology.  CXR: Cardiomegaly with interstitial coarsening which may be due to edema or atypical infection.  R foot X ray showed osseous destruction involving the tuft of the distal phalanx of the great toe, as well as in the second toe involving both the distal and mid phalanges centered around the DIP joint. Overlying soft tissues are irregular with probable soft tissue gas  ABIs showed Abnormal R and L toe-brachial index   ECHO 2/12: Large 3 x 3 cm round heterogenous mass at the LV apex which is suggestive of thrombus given severe LV dysfunction. EF 20-25%, global hypokinesis, grade II diastolic dysfunction, mildly reduced RV function, mildly elevated pulmonary artery pressure, elevated LVEDP,  moderate MR.   Resolved Hospital Problem list   N/A  Assessment & Plan:  Septic shock, cardiogenic vs distributive   R foot wet gangrene, suspected osteomyelitis  Leukocytosis  Presented with 1 week of generalized weakness, 2 days of AMS, hypotensive admitted for  suspected HHS. Lactic acid 2.6 >>2.3>>8.7. Remained hypotensive, did not respond to fluid resuscitation. Started on levophed to keep MAP > 60. R foot x ray concerning for probable osteomyelitis. Orthopedics on board with plans for possible amputation on Friday.      - ABIs showed Abnormal R and L toe-brachial index, VVS consulted, plan for RLE angiogram 2/13. Stop IV heparin ~0900 - Follow up on Cooxmetry panel  - Continue IV Vancomycin with addition of IV cefepime and PO flagyl 500 BID  - Continue Levophed with goal MAP > 60 - S/p IV NS 10-20 mL/hr infusion   Acute to early Subacute CVAs  MR brain findings multiple infarcts concerning for embolic etiology. Neurology on board. Per exam, no acute focal deficits. AO x 4, no dysarthria. ECHO findings concerning for large 3 x 3 cm mass at the LV apex suggestive for thrombus. Cardiology consulted.  - Pharmacy dosed heparin  - If any acute neurological change, get STAT CT head  - Continue ASA and Statin - Discontinue CTA neck and brain; plan for RLE angiogram tomorrow, monitor for Cr function     - PT/OT/SLP  - NIHSS   HHS Poorly controlled DM-2 (HbA1c 13.3%), not on insulin  AGMA  Lactic Acidosis Presented with CBG 548, beta-hydroxybutyrate acid 1.04, and  Serum osmolality 302. Initially started on endotool/IV insulin in the ED, transitioned to SSI and semglee 12 units daily.  - Will continue to follow up on daily BMETs - Glycemic control  - Semglee 12 units daily and SSI  AKI  Initial Cr 1.98, baseline around 0.99, GFR 37. Likely due to above. Currently, Cr 1.84, improving.  - Follow up on Renal US  - S/p IV NS 10-20 mL/hr infusion  - continue to trend Cr  CHF (systolic and diastolic) LV apical thrombus CAD HTN - ECHO EF 20-25%, global hypokinesis, grade II diastolic dysfunction, dilated IVC with 3x3 cm mass at LV apex suggestive of thrombus.  - Hold home GDMT medications in the setting of hypotension  - ASA and Statin     Normocytic Anemia  Presented with Hgb 8.9, baseline ~10-11. Labs showed Iron 22, TIBC 161, saturation 14. Ferritin 264. Vit B12 908. IDA can be likely due to the current infectious/inflammatory process.  - Consider Iron repletion when sepsis resolves.    HypoMag Presented with Mg 1.5, replete and recheck 2.3.  - Continue to monitor Mg   Hypophostamenia  Phosphorus 1.6, replete and recheck  - continue to monitor   Best Practice (right click and "Reselect all SmartList Selections" daily)   Diet/type: NPO until SLP eval  DVT prophylaxis prophylactic heparin  Pressure ulcer(s): assessment by RN GI prophylaxis: PPI Lines: Central line Foley:  External urinary catheter  Code Status:  full code Last date of multidisciplinary goals of care discussion [--]  Labs   CBC: Recent Labs  Lab 06/13/23 1940 06/13/23 1958 06/13/23 1959  WBC 21.8*  --   --   NEUTROABS 20.5*  --   --   HGB 8.9* 10.2* 10.2*  HCT 27.9* 30.0* 30.0*  MCV 87.5  --   --   PLT 195  --   --     Basic Metabolic Panel: Recent Labs  Lab 06/13/23 1939 06/13/23 1958 06/13/23 1959 06/13/23 2228 06/14/23 0452  NA 126* 128* 127* 127* 130*  K 4.1 4.0 4.1 3.8 3.9  CL 91*  --  95* 97* 99  CO2 19*  --   --  19* 13*  GLUCOSE 545*  --  564* 479* 195*  BUN 19  --  21 20 20   CREATININE 1.98*  --  1.90* 1.80* 1.84*  CALCIUM 8.5*  --   --  7.7* 8.0*  MG  --   --   --  1.5* 2.3  PHOS  --   --   --   --  1.6*   GFR: Estimated Creatinine Clearance: 45 mL/min (A) (by C-G formula based on SCr of 1.84 mg/dL (H)). Recent Labs  Lab 06/13/23 1940 06/13/23 1959 06/13/23 2232 06/14/23 0452  PROCALCITON  --   --   --  23.25  WBC 21.8*  --   --   --   LATICACIDVEN  --  2.6* 2.3* 8.7*    Liver Function Tests: Recent Labs  Lab 06/13/23 1939 06/14/23 0452  AST 29 26  ALT 27 22  ALKPHOS 49 41  BILITOT 0.7 0.6  PROT 6.7 6.0*  ALBUMIN 2.1* 1.8*   Recent Labs  Lab 06/13/23 1939  LIPASE 18   Recent Labs  Lab  06/13/23 2031  AMMONIA 23    ABG    Component Value Date/Time   PHART 7.392 10/14/2022 0804   PCO2ART 33.8 10/14/2022 0804   PO2ART 96 10/14/2022 0804   HCO3 21.1 06/13/2023 1958   TCO2 20 (L) 06/13/2023 1959   ACIDBASEDEF 2.0 06/13/2023 1958   O2SAT 84 06/13/2023 1958     Coagulation Profile: Recent Labs  Lab 06/14/23 0452  INR 1.7*    Cardiac Enzymes: No results for input(s): "CKTOTAL", "CKMB", "CKMBINDEX", "TROPONINI" in the last 168 hours.  HbA1C: Hemoglobin A1C  Date/Time Value Ref Range Status  11/22/2022 01:32 PM 11.4 (A) 4.0 - 5.6 % Final   Hgb A1c MFr Bld  Date/Time Value Ref Range Status  06/13/2023 07:40 PM 13.3 (H) 4.8 - 5.6 % Final    Comment:    (NOTE) Pre diabetes:          5.7%-6.4%  Diabetes:              >6.4%  Glycemic control for   <7.0% adults with diabetes   08/18/2022 06:09 PM 13.6 (H) 4.8 - 5.6 % Final    Comment:    (NOTE) Pre diabetes:          5.7%-6.4%  Diabetes:              >6.4%  Glycemic control for   <7.0% adults with diabetes     CBG: Recent Labs  Lab 06/14/23 0325 06/14/23 0417 06/14/23 0455 06/14/23 0516 06/14/23 0741  GLUCAP 113* 133* 139* 162* 203*    Review of Systems:   None.   Past Medical History:  He,  has a past medical history of Anemia, Diabetes mellitus without complication (HCC) (1987), Family history of breast cancer, Hypertension, Myocardial infarction [facility]), and Prostate cancer (HCC).   Surgical History:   Past Surgical History:  Procedure Laterality Date   DENTAL SURGERY     RADIOACTIVE SEED IMPLANT N/A 02/04/2021   Procedure: RADIOACTIVE SEED IMPLANT/BRACHYTHERAPY IMPLANT;  Surgeon: Jannifer Hick, MD;  Location: [facility] ORS;  Service: Urology;  Laterality: N/A;   RIGHT/LEFT HEART CATH AND CORONARY ANGIOGRAPHY N/A 05/25/2020   Procedure:  RIGHT/LEFT HEART CATH AND CORONARY ANGIOGRAPHY;  Surgeon: [place], Peter M, MD;  Location: [facility] INVASIVE CV LAB;  Service: Cardiovascular;  Laterality: N/A;    RIGHT/LEFT HEART CATH AND CORONARY ANGIOGRAPHY N/A 10/14/2022   Procedure: RIGHT/LEFT HEART CATH AND CORONARY ANGIOGRAPHY;  Surgeon: Dolores Patty, MD;  Location: [facility] INVASIVE CV LAB;  Service: Cardiovascular;  Laterality: N/A;   SPACE OAR INSTILLATION N/A 02/04/2021   Procedure: SPACE OAR INSTILLATION;  Surgeon: Jannifer Hick, MD;  Location: [facility] ORS;  Service: Urology;  Laterality: N/A;     Social History:   reports that he quit smoking about 6 years ago. His smoking use included cigarettes. He started smoking about 49 years ago. He has a 43 pack-year smoking history. He has never used smokeless tobacco. He reports that he does not currently use alcohol. He reports that he does not currently use drugs.   Family History:  His family history includes Breast cancer in his mother; Cancer in his maternal grandfather.   Allergies No Known Allergies   Home Medications  Prior to Admission medications   Medication Sig Start Date End Date Taking? Authorizing Provider  Accu-Chek Softclix Lancets lancets SMARTSIG:Topical 1-4 Times Daily 08/20/22  Yes Atway, Rayann N, DO  aspirin EC 81 MG tablet Take 1 tablet (81 mg total) by mouth daily. Swallow whole. 09/07/22 09/07/23 Yes Simmons, Brittainy M, PA-C  blood glucose meter kit and supplies KIT Dispense based on patient and insurance preference. Use up to four times daily as directed. (FOR ICD-9 250.00, 250.01). 08/20/22  Yes Atway, Rayann N, DO  digoxin (LANOXIN) 0.125 MG tablet Take 1 tablet (0.125 mg total) by mouth daily. 09/07/22  Yes Robbie Lis M, PA-C  ezetimibe (ZETIA) 10 MG tablet Take 1 tablet (10 mg total) by mouth daily. 11/25/22  Yes Rana Snare, DO  furosemide (LASIX) 20 MG tablet Take 1 tablet (20 mg total) by mouth daily. 10/05/22 10/05/23 Yes Bensimhon, Bevelyn Buckles, MD  glucose blood (CONTOUR NEXT TEST) test strip Use to check blood sugar up to 3 times daily as instructed 11/22/22  Yes Rana Snare, DO  ibuprofen (ADVIL) 200 MG tablet  Take 400 mg by mouth every 6 (six) hours as needed for headache or mild pain (pain score 1-3).   Yes [provider]  metoprolol succinate (TOPROL-XL) 25 MG 24 hr tablet TAKE 1 TABLET BY MOUTH DAILY 09/12/22  Yes Lyndle Herrlich, MD  spironolactone (ALDACTONE) 25 MG tablet Take 1 tablet (25 mg total) by mouth daily. 10/05/22  Yes Bensimhon, Bevelyn Buckles, MD  atorvastatin (LIPITOR) 40 MG tablet TAKE 1 TABLET BY MOUTH EVERY DAY Patient not taking: Reported on 06/13/2023 09/12/22   Lyndle Herrlich, MD  empagliflozin (JARDIANCE) 10 MG TABS tablet Take 1 tablet (10 mg total) by mouth daily before breakfast. Patient not taking: Reported on 06/13/2023 11/22/22   Rana Snare, DO  ferrous sulfate 325 (65 FE) MG tablet TAKE 1 TABLET BY MOUTH EVERY DAY Patient not taking: Reported on 06/13/2023 09/20/22   Lyndle Herrlich, MD  gabapentin (NEURONTIN) 300 MG capsule TAKE 1 CAPSULE BY MOUTH TWICE A DAY Patient not taking: Reported on 06/13/2023 09/29/22   Lyndle Herrlich, MD  insulin glargine (LANTUS SOLOSTAR) 100 UNIT/ML Solostar Pen INJECT 16 UNITS INTO THE SKIN AT BEDTIME. Patient not taking: Reported on 06/13/2023 10/10/22   Lyndle Herrlich, MD  metFORMIN (GLUCOPHAGE) 1000 MG tablet TAKE 1 TABLET (1,000 MG TOTAL) BY MOUTH TWICE A DAY WITH FOOD Patient not taking: Reported on 06/13/2023 10/28/22  Bensimhon, Bevelyn Buckles, MD  Multiple Vitamin (MULTIVITAMIN) tablet Take 1 tablet by mouth daily. Patient not taking: Reported on 06/13/2023    [provider]  sacubitril-valsartan (ENTRESTO) 97-103 MG Take 1 tablet by mouth 2 (two) times daily. Patient not taking: Reported on 06/13/2023 09/20/22   Bensimhon, Bevelyn Buckles, MD  tamsulosin (FLOMAX) 0.4 MG CAPS capsule TAKE 1 CAPSULE BY MOUTH EVERYDAY AT BEDTIME Patient not taking: Reported on 06/13/2023 09/12/22   Lyndle Herrlich, MD     Critical care time:

## 2023-06-14 NOTE — Progress Notes (Addendum)
Pharmacy Antibiotic Note  Derek Thompson is a 63 y.o. male admitted on 06/13/2023 with  right foot infection .  Pharmacy has been consulted for Vancomycin dosing. WBC elevated, noted renal dysfunction.   Diabetic foot infection, plans for vascular intervention tomorrow, ortho intervention for Friday. Reviewed vancomycin doses per MAR. Patient has received load and initial maintenance dose totaling 2750 mg (~35 mg/kg) of vancomycin this morning in error. UOP decreased this shift. (Intended to receive 1500 mg x1, then 1250 mg IV q24h). Only 150 mL documented output today.   Plan: Hold vancomycin Continue cefepime/metronidazole per MD Follow up renal function tomorrow Plan for level, timing based on renal function  Height: 6' (182.9 cm) Weight: 77.5 kg (170 lb 13.7 oz) IBW/kg (Calculated) : 77.6  Temp (24hrs), Avg:97.9 F (36.6 C), Min:97.5 F (36.4 C), Max:98.2 F (36.8 C)  Recent Labs  Lab 06/13/23 1939 06/13/23 1940 06/13/23 1959 06/13/23 2228 06/13/23 2232 06/14/23 0452  WBC  --  21.8*  --   --   --   --   CREATININE 1.98*  --  1.90* 1.80*  --  1.84*  LATICACIDVEN  --   --  2.6*  --  2.3* 8.7*    Estimated Creatinine Clearance: 45 mL/min (A) (by C-G formula based on SCr of 1.84 mg/dL (H)).    No Known Allergies  Lora Paula, PharmD PGY-2 Infectious Diseases Pharmacy Resident [facility] 06/14/2023 3:47 PM

## 2023-06-14 NOTE — Progress Notes (Addendum)
STROKE TEAM PROGRESS NOTE   SIGNIFICANT HOSPITAL EVENTS 2/12 -admit to ICU  INTERIM HISTORY/SUBJECTIVE Seen in ICU, echo shows apex thrombus, EF 20-25%. Cardiology consulted. VVS consulted due to abnormal ABIs. He will likely need amputation of right first and second toe on Friday. Currently on ASA 81mg , hold plavix until surgical plan is confirmed.   OBJECTIVE  CBC    Component Value Date/Time   WBC 21.8 (H) 06/13/2023 1940   RBC 3.19 (L) 06/13/2023 1940   HGB 10.2 (L) 06/13/2023 1959   HCT 30.0 (L) 06/13/2023 1959   PLT 195 06/13/2023 1940   MCV 87.5 06/13/2023 1940   MCH 27.9 06/13/2023 1940   MCHC 31.9 06/13/2023 1940   RDW 16.8 (H) 06/13/2023 1940   LYMPHSABS 0.6 (L) 06/13/2023 1940   MONOABS 0.5 06/13/2023 1940   EOSABS 0.0 06/13/2023 1940   BASOSABS 0.0 06/13/2023 1940    BMET    Component Value Date/Time   NA 130 (L) 06/14/2023 0452   NA 135 11/22/2022 1445   K 3.9 06/14/2023 0452   CL 99 06/14/2023 0452   CO2 13 (L) 06/14/2023 0452   GLUCOSE 195 (H) 06/14/2023 0452   BUN 20 06/14/2023 0452   BUN 24 11/22/2022 1445   CREATININE 1.84 (H) 06/14/2023 0452   CALCIUM 8.0 (L) 06/14/2023 0452   EGFR 86 11/22/2022 1445   GFRNONAA 41 (L) 06/14/2023 0452    IMAGING past 24 hours DG CHEST PORT 1 VIEW Result Date: 06/14/2023 CLINICAL DATA:  Encounter for central line placement EXAM: PORTABLE CHEST 1 VIEW COMPARISON:  Yesterday FINDINGS: Left IJ line with tip at the upper cavoatrial junction. No pneumothorax or new mediastinal widening. There is stable cardiomegaly and bilateral hazy opacification of the chest. Layering pleural fluid is possible. Artifact from EKG leads. IMPRESSION: 1. New central line without complicating feature. 2. Stable aeration Electronically Signed   By: Tiburcio Pea M.D.   On: 06/14/2023 07:28   DG Foot 2 Views Right Result Date: 06/14/2023 CLINICAL DATA:  63 year old male with history of infection of the first and second toes of the right  foot. EXAM: RIGHT FOOT - 2 VIEW COMPARISON:  No priors. FINDINGS: Two views of the right foot demonstrate lucent areas of osseous destruction involving the tuft of the distal phalanx of the great toe, as well as in the second toe involving both the distal and mid phalanges centered around the D IP joint. Overlying soft tissues are irregular with probable soft tissue gas. No other definite osseous lesions are noted. No acute displaced fracture or dislocation. IMPRESSION: 1. 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 Electronically Signed   By: Trudie Reed M.D.   On: 06/14/2023 06:42   MR BRAIN WO CONTRAST Result Date: 06/14/2023 CLINICAL DATA:  Mental status change, unknown cause EXAM: MRI HEAD WITHOUT CONTRAST TECHNIQUE: Multiplanar, multiecho pulse sequences of the brain and surrounding structures were obtained without intravenous contrast. COMPARISON:  CT head from today. FINDINGS: Brain: Acute right cerebellar infarct. Many small acute infarcts throughout the left frontal, parietal and occipital lobes. More confluent/larger acute or early subacute infarct in the posterior right frontal lobe. No mass occupying acute hemorrhage or midline shift. Remote cerebellar infarcts. No hydrocephalus. Vascular: Major arterial flow voids are maintained at the skull base. Skull and upper cervical spine: Normal marrow signal. Sinuses/Orbits: Clear sinuses.  No acute orbital findings. IMPRESSION: Acute  or early subacute confluent infarct in the right frontal lobe. Additional smaller acute infarcts in the right cerebellum, left frontal, parietal and occipital lobes. Given involvement of multiple vascular territories, consider an embolic etiology. Electronically Signed   By: Feliberto Harts M.D.   On: 06/14/2023 02:17   DG Chest Portable 1 View Result Date: 06/13/2023 CLINICAL DATA:  Cough,  altered mental status EXAM: PORTABLE CHEST 1 VIEW COMPARISON:  08/18/2022 FINDINGS: Stable cardiomegaly. Aortic atherosclerotic calcification. Diffuse interstitial coarsening greatest in the mid and lower lungs bilaterally. No focal pneumonia. No pleural effusion or pneumothorax. IMPRESSION: Cardiomegaly with interstitial coarsening which may be due to edema or atypical infection. Electronically Signed   By: Minerva Fester M.D.   On: 06/13/2023 21:10   CT HEAD WO CONTRAST ( ) Result Date: 06/13/2023 CLINICAL DATA:  Neuro deficit, acute, stroke suspected Dizziness, altered mental status EXAM: CT HEAD WITHOUT CONTRAST TECHNIQUE: Contiguous axial images were obtained from the base of the skull through the vertex without intravenous contrast. RADIATION DOSE REDUCTION: This exam was performed according to the departmental dose-optimization program which includes automated exposure control, adjustment of the mA and/or kV according to patient size and/or use of iterative reconstruction technique. COMPARISON:  None Available. FINDINGS: Brain: Probably acute or subacute infarcts in the posterior right frontal lobe. Possible slight petechial hemorrhage without mass occupying acute hemorrhage. No significant mass effect. Midline shift. No hydrocephalus. No visible mass lesion. Remote bilateral cerebellar infarcts. Vascular: No hyperdense vessel.  Calcific atherosclerosis. Skull: No acute fracture. Sinuses/Orbits: Clear sinuses.  No acute orbital findings. Other: No mastoid effusions. IMPRESSION: 1. Probably acute or subacute infarcts in the posterior right frontal lobe. Recommend MRI. 2. Possible slight petechial hemorrhage without mass occupying acute hemorrhage. 3. No significant mass effect. Electronically Signed   By: Feliberto Harts M.D.   On: 06/13/2023 20:51    Vitals:   06/14/23 0630 06/14/23 0645 06/14/23 0700 06/14/23 0739  BP: 95/81 (!) 84/61 104/77   Pulse: 100 (!) 132 (!) 128   Resp: 19 (!) 22 15    Temp:    98 F (36.7 C)  TempSrc:    Oral  SpO2: 94% 100% 96%   Weight:      Height:         PHYSICAL EXAM General:  Alert, malnourished looking middle-aged African-American male patient in no acute distress Psych:  Mood and affect appropriate for situation CV: Regular rate and rhythm on monitor Respiratory:  Regular, unlabored respirations on room air GI: Abdomen soft and nontender Gangrene right foot toes  NEURO:  Mental Status: AA&Ox3, patient is able to give clear and coherent history Speech/Language: speech is without dysarthria or aphasia.  Naming, repetition, fluency, and comprehension intact.  Cranial Nerves:  II: PERRL.  Left field cut III, IV, VI: EOMI. Eyelids elevate symmetrically.  V: Sensation is intact to light touch and symmetrical to face.  VII: Face is symmetrical resting and smiling VIII: hearing intact to voice. IX, X: Palate elevates symmetrically. Phonation is normal.  ZO:[id number] shrug 5/5. XII: tongue is midline without fasciculations. Motor: Left grip is slightly weaker. Slowed RAM on the left.  Tone: is normal and bulk is normal Sensation- Intact to light touch bilaterally. Extinction absent to light touch to DSS.   Coordination: FTN intact bilaterally, HKS: no ataxia in BLE.No drift.  Gait- deferred  Most Recent NIH 3     ASSESSMENT/PLAN  Derek Thompson is a 63 y.o. male with history of HTN, DM, CHF admitted for 1  week of altered mental status.  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  Additionally he has gangrene and osteo in his right lower extremity (first and second toe on the right foot) with further workup underway.  He will likely require an amputation on Friday with the orthopedic team.  Vascular surgery is additionally planning for a right lower extremity angiogram tomorrow.  NIH on Admission 4  Acute Ischemic Infarct:  right frontal infarct  Etiology:  embolic with apical mass likely  thrombus and low EF on echo CTA head & neck pending MRI  Acute or early subacute confluent infarct in the right frontal lobe. Additional smaller acute infarcts in the right cerebellum, left frontal, parietal and occipital lobes.  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 VVS and Ortho consulted Right lower extremity angiogram planned for 2/13 Possible amputation Friday Abx per CCM  Hyperlipidemia Home meds: Atorvastatin 40 mg, resumed in hospital LDL 109, goal < 70 Continue statin at discharge  Diabetes type II Uncontrolled Home meds: Metoprolol, insulin HgbA1c 13.3, goal < 7.0 CBGs SSI Recommend close follow-up with PCP for better DM control  Dysphagia Patient has post-stroke dysphagia, SLP consulted    Diet   Diet NPO time specified   Advance diet as tolerated  Other Stroke Risk Factors Obesity, Body mass index is 23.17 kg/m., BMI >/= 30 associated with increased stroke risk, recommend weight loss, diet and exercise as appropriate    Hospital day # 1  Patient seen and examined by NP/APP with MD. MD to update note as needed.   Elmer Picker, DNP, FNP-BC [facility] Pager: [phone]  STROKE MD NOTE :  I have personally obtained history,examined this patient, reviewed notes, independently viewed imaging studies, participated in medical decision making and plan of care.ROS completed by me personally and pertinent positives fully documented  I have made any additions or clarifications directly to  the above note. Agree with note above.  Patient admitted with generalized weakness, gangrenous changes in the right second toe and hypotension and hyperglycemia and acidosis.  MRI scan of the brain showed bilateral embolic infarcts likely from cardiogenic etiology from LV thrombus and cardiomyopathy.  Recommend anticoagulation with IV heparin and continue ongoing stroke workup and aggressive risk factor modification.  Check CT angiogram of the brain and neck tomorrow if renal function improves.  Maintain aggressive hydration if cardiac situation allows..  Treatment of gangrene with antibiotics and amputation as per CCM and vascular surgery.  Discussed with Dr. Rosaria Ferries critical care medicine.This patient is critically ill and at significant risk of neurological worsening, death and care requires constant monitoring of vital signs, hemodynamics,respiratory and cardiac monitoring, extensive review of multiple databases, frequent neurological assessment, discussion with family, other specialists and medical decision making of high complexity.I have made any additions or clarifications directly to the above note.This critical care time does not reflect procedure time, or teaching time or supervisory time of PA/NP/Med Resident etc but could involve care discussion time.  I spent 30 minutes of neurocritical care  time  in the care of  this patient.      Delia Heady, MD Medical Director [facility] Pager: [phone] 06/14/2023 5:17 PM   To contact Stroke Continuity provider, please refer to [email or web address]. After hours, contact General Neurology

## 2023-06-14 NOTE — Progress Notes (Signed)
PHARMACY - ANTICOAGULATION CONSULT NOTE  Pharmacy Consult for IV heparin Indication:  Apex thrombus  No Known Allergies  Patient Measurements: Height: 6' (182.9 cm) Weight: 77.5 kg (170 lb 13.7 oz) IBW/kg (Calculated) : 77.6 Heparin Dosing Weight: Actual body weight  Vital Signs: Temp: 97.7 F (36.5 C) (02/12 1952) Temp Source: Axillary (02/12 1952) BP: 110/69 (02/12 1800) Pulse Rate: 88 (02/12 1800)  Labs: Recent Labs    06/13/23 1940 06/13/23 1958 06/13/23 1959 06/13/23 2228 06/14/23 0452 06/14/23 1255 06/14/23 1405 06/14/23 2004  HGB 8.9* 10.2* 10.2*  --   --   --   --   --   HCT 27.9* 30.0* 30.0*  --   --   --   --   --   PLT 195  --   --   --   --   --   --   --   APTT  --   --   --   --  32  --   --   --   LABPROT  --   --   --   --  20.2*  --   --   --   INR  --   --   --   --  1.7*  --   --   --   HEPARINUNFRC  --   --   --   --   --   --   --  0.11*  CREATININE  --   --  1.90* 1.80* 1.84* 1.51*  --   --   TROPONINIHS  --   --   --   --   --   --  66*  --     Estimated Creatinine Clearance: 54.9 mL/min (A) (by C-G formula based on SCr of 1.51 mg/dL (H)).   Medical History: Past Medical History:  Diagnosis Date   Anemia    Diabetes mellitus without complication (HCC) 1987   Family history of breast cancer    Hypertension    Myocardial infarction (HCC)    Prostate cancer (HCC)     Medications:  Scheduled:   [START ON 06/15/2023]  stroke: early stages of recovery book   Does not apply Once   aspirin EC  81 mg Oral Daily   atorvastatin  80 mg Oral Daily   Chlorhexidine Gluconate Cloth  6 each Topical Daily   insulin aspart  0-9 Units Subcutaneous Q4H   [START ON 06/15/2023] insulin glargine-yfgn  12 Units Subcutaneous Daily   metroNIDAZOLE  500 mg Oral Q12H   [START ON 06/15/2023] pantoprazole  40 mg Oral Daily   phosphorus  500 mg Oral Q4H   sodium chloride flush  10-40 mL Intracatheter Q12H   vancomycin variable dose per unstable renal function  (pharmacist dosing)   Does not apply See admin instructions   Infusions:   sodium chloride 250 mL (06/14/23 0540)   ceFEPime (MAXIPIME) IV 2 g (06/14/23 1049)   heparin     norepinephrine (LEVOPHED) Adult infusion Stopped (06/14/23 1150)    Assessment: Derek Thompson is a 63 y.o M w/ PMHx of poorly controlled T2DM, HFrEF (LVEF 25%), LBBB, G2DD and LV global hypokinesis, HTN, CAD, Prostate Cancer treated w/ radiation 2022 who presented to ED for 1 week of progressive generalized weakness, 2 days AMS, admitted for suspected HHS. 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 Note patient also with acute/subacute infarct in R frontal lobe, with  suspicion for embolic etiology.  PM heparin level 0.11  Goal of Therapy:  Heparin level 0.3-0.5 units/ml  (targeting low end for concominant CVA/ w/ petechial hemorrhage Monitor platelets by anticoagulation protocol: Yes   Plan:  Increase heparin to 1400 units / hr Heparin off at 9 am for vascular procedure  Thank you. Okey Regal, PharmD 06/14/2023,8:54 PM

## 2023-06-14 NOTE — Significant Event (Signed)
Rapid Response Event Note   Reason for Call :  Symptomatic hypotension  Initial Focused Assessment:  Pt lying in bed in trendelenburg position, in no visible distress. His eyes are closed. He will open eyes with continuous stimulation, follow commands, and move all extremities. He c/o dizziness and some SOB. With no stimulation, pt goes back to sleep immediately.  Pupils 4, equal, and sluggish. Lungs diminished t/o. ABD soft/nontender. Skin pale, cold, clammy.   HR-67, BP-68/55, RR-22, SpO2-100% on 4L [place](B[place] placed by 5W RN d/t pt c/o of SOB), CBG-133  Interventions:  1L NS, 1.6L LR given PTA RRT Levophed gtt  PCCM consult Plan of Care:  Pt tx to 2M06.   Event Summary:   MD Notified: Dr. Antionette Char notified and came to bedside. Call [id number] Arrival [id number] End GNFA:[id number]  Terrilyn Saver, RN

## 2023-06-14 NOTE — Progress Notes (Signed)
This RN was assessing the patient at 4am. Patient was restless, pale, cool to touch and agitated. RN took BP and patient was hypotensive. Pt was repositioned, charge RN and nocturnist notified. Rapid response called. Rapid RN at bedside to assess patient. Pt SBP in the 80s while supine at the highest. DR. Opyd at bedside stating to start levophed gtt and transfer patient to ICU. Drip was managed by rapid RN. Patient belongings were taken to ICU and bedside report was done.

## 2023-06-14 NOTE — Progress Notes (Signed)
PHARMACY - ANTICOAGULATION CONSULT NOTE  Pharmacy Consult for IV heparin Indication:  Apex thrombus  No Known Allergies  Patient Measurements: Height: 6' (182.9 cm) Weight: 77.5 kg (170 lb 13.7 oz) IBW/kg (Calculated) : 77.6 Heparin Dosing Weight: Actual body weight  Vital Signs: Temp: 97.9 F (36.6 C) (02/12 1120) Temp Source: Oral (02/12 1120) BP: 104/67 (02/12 0930) Pulse Rate: 89 (02/12 0930)  Labs: Recent Labs    06/13/23 1940 06/13/23 1958 06/13/23 1959 06/13/23 2228 06/14/23 0452  HGB 8.9* 10.2* 10.2*  --   --   HCT 27.9* 30.0* 30.0*  --   --   PLT 195  --   --   --   --   APTT  --   --   --   --  32  LABPROT  --   --   --   --  20.2*  INR  --   --   --   --  1.7*  CREATININE  --   --  1.90* 1.80* 1.84*    Estimated Creatinine Clearance: 45 mL/min (A) (by C-G formula based on SCr of 1.84 mg/dL (H)).   Medical History: Past Medical History:  Diagnosis Date   Anemia    Diabetes mellitus without complication (HCC) 1987   Family history of breast cancer    Hypertension    Myocardial infarction [facility])    Prostate cancer (HCC)     Medications:  Scheduled:   [START ON 06/15/2023]  stroke: early stages of recovery book   Does not apply Once   aspirin EC  81 mg Oral Daily   atorvastatin  80 mg Oral Daily   Chlorhexidine Gluconate Cloth  6 each Topical Daily   insulin aspart  0-9 Units Subcutaneous Q4H   [START ON 06/15/2023] insulin glargine-yfgn  12 Units Subcutaneous Daily   metroNIDAZOLE  500 mg Oral Q12H   pantoprazole (PROTONIX) IV  40 mg Intravenous Daily   potassium & sodium phosphates  2 packet Oral Q4H   sodium chloride flush  10-40 mL Intracatheter Q12H   Infusions:   sodium chloride 250 mL (06/14/23 0540)   ceFEPime (MAXIPIME) IV 2 g (06/14/23 1049)   heparin     norepinephrine (LEVOPHED) Adult infusion 3 mcg/min (06/14/23 0826)   vancomycin 1,250 mg (06/14/23 1040)    Assessment: Derek Thompson is a 63 y.o M w/ PMHx of poorly controlled  T2DM, HFrEF (LVEF 25%), LBBB, G2DD and LV global hypokinesis, HTN, CAD, Prostate Cancer treated w/ radiation 2022 who presented to ED for 1 week of progressive generalized weakness, 2 days AMS, admitted for suspected HHS. 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 Note patient also with acute/subacute infarct in R frontal lobe, with suspicion for embolic etiology.  Goal of Therapy:  Heparin level 0.3-0.5 units/ml  (targeting low end for concominant CVA/ w/ petechial hemorrhage Monitor platelets by anticoagulation protocol: Yes   Plan:  Start heparin infusion at 1250 units/hr Heparin level in 8 hours Monitor CBC, signs and symptoms of bleeding, and transition from heparin  Louie Casa Akshay Spang 06/14/2023,1:05 PM

## 2023-06-14 NOTE — Consult Note (Addendum)
Hospital Consult    Reason for Consult:  RLE gangrene Requesting Physician:  Melody Comas MD MRN #:  [id number]  History of Present Illness: Derek Thompson is a 63 y.o. male with a PMH of HTN, MI, prostate cancer, and DMII who was admitted to [facility] yesterday with generalized weakness for 1 week.  He was suspected to have HHNK with AMS.   Upon admission he was found to meet SIRS criteria with gangrenous changes to the right second toe.  He was started on broad-spectrum antibiotics.  He was also found to have acute infarcts on MRI consistent with embolic etiology.  Echo demonstrated a large apical LV thrombus.  He was started on a heparin drip today.  We were consulted for the patient's abnormal ABIs in the setting of right lower extremity gangrene.  The patient states he has had skin changes to his right first and second toes for about 2 months.  This has worsened over time.  He has not sought care for these wounds.  He denies any fevers or chills.  He endorses occasional drainage from his wounds.  He denies any previous tissue loss.  He denies any rest pain.  He endorses bilateral calf claudication between 20 to 50 yards of walking.  He is a former smoker and quit in 2019. Prior to this he smoked about 1ppd for 43 years.  Past Medical History:  Diagnosis Date   Anemia    Diabetes mellitus without complication (HCC) 1987   Family history of breast cancer    Hypertension    Myocardial infarction [facility])    Prostate cancer [facility])     Past Surgical History:  Procedure Laterality Date   DENTAL SURGERY     RADIOACTIVE SEED IMPLANT N/A 02/04/2021   Procedure: RADIOACTIVE SEED IMPLANT/BRACHYTHERAPY IMPLANT;  Surgeon: Jannifer Hick, MD;  Location: [facility];  Service: Urology;  Laterality: N/A;   RIGHT/LEFT HEART CATH AND CORONARY ANGIOGRAPHY N/A 05/25/2020   Procedure: RIGHT/LEFT HEART CATH AND CORONARY ANGIOGRAPHY;  Surgeon: Swaziland, Peter M, MD;  Location: [facility] INVASIVE CV LAB;  Service:  Cardiovascular;  Laterality: N/A;   RIGHT/LEFT HEART CATH AND CORONARY ANGIOGRAPHY N/A 10/14/2022   Procedure: RIGHT/LEFT HEART CATH AND CORONARY ANGIOGRAPHY;  Surgeon: Dolores Patty, MD;  Location: [facility] INVASIVE CV LAB;  Service: Cardiovascular;  Laterality: N/A;   SPACE OAR INSTILLATION N/A 02/04/2021   Procedure: SPACE OAR INSTILLATION;  Surgeon: Jannifer Hick, MD;  Location: [facility];  Service: Urology;  Laterality: N/A;    No Known Allergies  Prior to Admission medications   Medication Sig Start Date End Date Taking? Authorizing Provider  Accu-Chek Softclix Lancets lancets SMARTSIG:Topical 1-4 Times Daily 08/20/22  Yes Atway, Rayann N, DO  aspirin EC 81 MG tablet Take 1 tablet (81 mg total) by mouth daily. Swallow whole. 09/07/22 09/07/23 Yes Simmons, Brittainy M, PA-C  blood glucose meter kit and supplies KIT Dispense based on patient and insurance preference. Use up to four times daily as directed. (FOR ICD-9 250.00, 250.01). 08/20/22  Yes Atway, Rayann N, DO  digoxin (LANOXIN) 0.125 MG tablet Take 1 tablet (0.125 mg total) by mouth daily. 09/07/22  Yes Robbie Lis M, PA-C  ezetimibe (ZETIA) 10 MG tablet Take 1 tablet (10 mg total) by mouth daily. 11/25/22  Yes Rana Snare, DO  furosemide (LASIX) 20 MG tablet Take 1 tablet (20 mg total) by mouth daily. 10/05/22 10/05/23 Yes Bensimhon, Bevelyn Buckles, MD  glucose blood (CONTOUR NEXT TEST) test strip Use to  check blood sugar up to 3 times daily as instructed 11/22/22  Yes Rana Snare, DO  ibuprofen (ADVIL) 200 MG tablet Take 400 mg by mouth every 6 (six) hours as needed for headache or mild pain (pain score 1-3).   Yes [provider]  metoprolol succinate (TOPROL-XL) 25 MG 24 hr tablet TAKE 1 TABLET BY MOUTH DAILY 09/12/22  Yes Lyndle Herrlich, MD  spironolactone (ALDACTONE) 25 MG tablet Take 1 tablet (25 mg total) by mouth daily. 10/05/22  Yes Bensimhon, Bevelyn Buckles, MD  atorvastatin (LIPITOR) 40 MG tablet TAKE 1 TABLET BY MOUTH EVERY  DAY Patient not taking: Reported on 06/13/2023 09/12/22   Lyndle Herrlich, MD  empagliflozin (JARDIANCE) 10 MG TABS tablet Take 1 tablet (10 mg total) by mouth daily before breakfast. Patient not taking: Reported on 06/13/2023 11/22/22   Rana Snare, DO  ferrous sulfate 325 (65 FE) MG tablet TAKE 1 TABLET BY MOUTH EVERY DAY Patient not taking: Reported on 06/13/2023 09/20/22   Lyndle Herrlich, MD  gabapentin (NEURONTIN) 300 MG capsule TAKE 1 CAPSULE BY MOUTH TWICE A DAY Patient not taking: Reported on 06/13/2023 09/29/22   Lyndle Herrlich, MD  insulin glargine (LANTUS SOLOSTAR) 100 UNIT/ML Solostar Pen INJECT 16 UNITS INTO THE SKIN AT BEDTIME. Patient not taking: Reported on 06/13/2023 10/10/22   Lyndle Herrlich, MD  metFORMIN (GLUCOPHAGE) 1000 MG tablet TAKE 1 TABLET (1,000 MG TOTAL) BY MOUTH TWICE A DAY WITH FOOD Patient not taking: Reported on 06/13/2023 10/28/22   Bensimhon, Bevelyn Buckles, MD  Multiple Vitamin (MULTIVITAMIN) tablet Take 1 tablet by mouth daily. Patient not taking: Reported on 06/13/2023    [provider]  sacubitril-valsartan (ENTRESTO) 97-103 MG Take 1 tablet by mouth 2 (two) times daily. Patient not taking: Reported on 06/13/2023 09/20/22   Bensimhon, Bevelyn Buckles, MD  tamsulosin (FLOMAX) 0.4 MG CAPS capsule TAKE 1 CAPSULE BY MOUTH EVERYDAY AT BEDTIME Patient not taking: Reported on 06/13/2023 09/12/22   Lyndle Herrlich, MD    Social History   Socioeconomic History   Marital status: Divorced    Spouse name: Not on file   Number of children: 3   Years of education: Not on file   Highest education level: Associate degree: academic program  Occupational History   Occupation: [employer]  Tobacco Use   Smoking status: Former    Current packs/day: 0.00    Average packs/day: 1 pack/day for 43.0 years (43.0 ttl pk-yrs)    Types: Cigarettes    Start date: 05/22/1974    Quit date: 05/22/2017    Years since quitting: 6.0   Smokeless tobacco: Never    Tobacco comments:    Quit 2019 about 1PPD  Vaping Use   Vaping status: Never Used  Substance and Sexual Activity   Alcohol use: Not Currently    Comment: last drink 2015   Drug use: Not Currently    Comment: none in 7 years   Sexual activity: Not Currently  Other Topics Concern   Not on file  Social History Narrative   3 sons   Social Drivers of Corporate investment banker Strain: Low Risk  (08/19/2022)   Overall Financial Resource Strain (CARDIA)    Difficulty of Paying Living Expenses: Not very hard  Food Insecurity: No Food Insecurity (06/14/2023)   Hunger Vital Sign    Worried About Running Out of Food in the Last Year: Never true    Ran Out of Food in the Last Year: Never true  Transportation Needs: No Transportation Needs (  06/14/2023)   PRAPARE - Administrator, Civil Service (Medical): No    Lack of Transportation (Non-Medical): No  Physical Activity: Not on file  Stress: Not on file  Social Connections: Not on file  Intimate Partner Violence: Not At Risk (06/14/2023)   Humiliation, Afraid, Rape, and Kick questionnaire    Fear of Current or Ex-Partner: No    Emotionally Abused: No    Physically Abused: No    Sexually Abused: No     Family History  Problem Relation Age of Onset   Breast cancer Mother        dx 30s/40s, twice   Cancer Maternal Grandfather        unknown type, d. >50    ROS: Otherwise negative unless mentioned in HPI  Physical Examination  Vitals:   06/14/23 1345 06/14/23 1400  BP: 97/64 98/67  Pulse: 85 85  Resp:    Temp:    SpO2: 99% 99%   Body mass index is 23.17 kg/m.  General:  somnolent appearing, NAD Gait: Not observed HENT: WNL, normocephalic Pulmonary: normal non-labored breathing Cardiac: regular Abdomen:  soft, NT/ND, no masses Skin: without rashes Vascular Exam/Pulses: 2+ femoral pulses bilaterally. 1+ right DP pulse Extremities: ulceration to right GT. Wet gangrene of right 2nd toe with foul  smell Musculoskeletal: generalized weakness Neurologic: A&O X 3;  No focal weakness or paresthesias are detected; speech is fluent/normal Psychiatric:  The pt has Normal affect. Lymph:  Unremarkable    CBC    Component Value Date/Time   WBC 21.8 (H) 06/13/2023 1940   RBC 3.19 (L) 06/13/2023 1940   HGB 10.2 (L) 06/13/2023 1959   HCT 30.0 (L) 06/13/2023 1959   PLT 195 06/13/2023 1940   MCV 87.5 06/13/2023 1940   MCH 27.9 06/13/2023 1940   MCHC 31.9 06/13/2023 1940   RDW 16.8 (H) 06/13/2023 1940   LYMPHSABS 0.6 (L) 06/13/2023 1940   MONOABS 0.5 06/13/2023 1940   EOSABS 0.0 06/13/2023 1940   BASOSABS 0.0 06/13/2023 1940    BMET    Component Value Date/Time   NA 130 (L) 06/14/2023 0452   NA 135 11/22/2022 1445   K 3.9 06/14/2023 0452   CL 99 06/14/2023 0452   CO2 13 (L) 06/14/2023 0452   GLUCOSE 195 (H) 06/14/2023 0452   BUN 20 06/14/2023 0452   BUN 24 11/22/2022 1445   CREATININE 1.84 (H) 06/14/2023 0452   CALCIUM 8.0 (L) 06/14/2023 0452   GFRNONAA 41 (L) 06/14/2023 0452   GFRAA 89 06/10/2020 1645    COAGS: Lab Results  Component Value Date   INR 1.7 (H) 06/14/2023   INR 1.0 02/04/2021   INR 1.0 01/12/2021     Non-Invasive Vascular Imaging:   ABIs (06/14/2023) +---------+------------------+-----+----------+--------+  Right   Rt Pressure (mmHg)IndexWaveform  Comment   +---------+------------------+-----+----------+--------+  Brachial 110                    triphasic           +---------+------------------+-----+----------+--------+  PTA     111               1.01 monophasic          +---------+------------------+-----+----------+--------+  DP      118               1.07 biphasic            +---------+------------------+-----+----------+--------+  Great Toe203  1.85 Abnormal            +---------+------------------+-----+----------+--------+   +---------+------------------+-----+---------+-------+  Left    Lt  Pressure (mmHg)IndexWaveform Comment  +---------+------------------+-----+---------+-------+  Brachial 110                    triphasic         +---------+------------------+-----+---------+-------+  PTA     116               1.05 triphasic         +---------+------------------+-----+---------+-------+  DP      109               0.99 biphasic          +---------+------------------+-----+---------+-------+  Great Toe71                0.65 Abnormal          +---------+------------------+-----+---------+-------+   +-------+-----------+-----------+------------+------------+  ABI/TBIToday's ABIToday's TBIPrevious ABIPrevious TBI  +-------+-----------+-----------+------------+------------+  Right 1.07       1.85                                 +-------+-----------+-----------+------------+------------+  Left  1.05       0.65                                 +-------+-----------+-----------+------------+------------+   ASSESSMENT/PLAN: This is a 63 y.o. male admitted with HHS and sepsis   -The patient was admitted to the ICU yesterday with suspected HHNK and AMS.  Upon admission he met SIRS criteria with wet gangrene of the right foot.  MRI of the brain also demonstrated several acute infarcts suspicious for embolic etiology.  Echo was positive for a large apical LV thrombus -He was found to have ulcerations to the right first and second toe upon admission.  The patient states that these have been present and worsening for the past 2 months.  He denies any recent fevers or chills.  He endorses drainage from these wounds.  He denies any pain -X-ray of the right foot demonstrates acute osteo in the right first and second toes. -On exam he has an ulceration to the right great toe.  He has wet gangrene of the right second toe.  He has palpable femoral pulses bilaterally.  He has a 1+ right DP pulse -ABIs are abnormal and falsely elevated in the right foot.   He has a falsely elevated great toe pressure of 203. -I have explained to the patient that he will likely require abdominal aortogram with right lower extremity runoff in the setting of critical limb ischemia with tissue loss.  I have explained that we will perform this procedure to better define and hopefully improve his blood flow in the right leg.  Hopefully we can improve his blood flow enough to heal amputation on the right.  Orthopedics may be performing right first and second toe amputations on Friday -The patient is agreeable for angiogram.  We can likely schedule this tomorrow in the Cath Lab with Dr. Chestine Spore.  He has been started on a heparin drip for his LV thrombus.  His heparin can be held on-call to the Cath Lab.  The patient also has an AKI with serum creatinine of 1.84.  We can attempt to perform most of his angiogram with CO2 to reduce further kidney injury -Likely will  plan for right lower extremity angiogram tomorrow.  Will make n.p.o. at midnight.  Will place consent orders.  Dr. Karin Lieu to see the patient later this afternoon   Loel Dubonnet PA-C [facility] [phone]   VASCULAR STAFF ADDENDUM: I have independently interviewed and examined the patient. I agree with the above.   Patient is a 63 year old male with right foot critical limb ischemia with tissue loss at the toes.  He is scheduled for amputation on Friday.  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  He has a palpable posterior tibial pulse in the right foot, palpable dorsalis pedis in the left foot.  He has a longstanding history of diabetes, and I am concerned for small vessel disease.  ABI was reviewed, demonstrating likely falsely elevated ABIs.  With history of longstanding  diabetes, poor ejection fraction, and likely medial calcinosis of the vessels, Derek Thompson would benefit from right lower extremity angiogram with possible intervention in an effort to define and improve distal perfusion for wound healing.  After discussing risks and benefits, Derek Thompson elected to proceed.  If creatinine remains elevated tomorrow, we will cancel, and rescheduled for Friday versus early next week.  Derek Sparrow MD [facility] Phone Number: [phone] 06/14/2023 3:25 PM

## 2023-06-14 NOTE — Consult Note (Signed)
NEUROLOGY CONSULT NOTE   Date of service: June 14, 2023 Patient Name: Derek Thompson MRN:  [id number] DOB:  06-12-1960 Chief Complaint: "Confusion" Requesting Provider: Leroy Sea, MD  History of Present Illness  Saamir Hoeffner is a 63 y.o. male with hx of hypertension and diabetes who presents with confusion that is been going on for about a week.  He states that he just felt like he was "off" and that it may be got a little bit worse over the course of the week.  He did not notice any focal weakness or numbness but due to the symptoms he presented to the emergency department.  In the ER, he had a CT which demonstrated an area of focal hypodensity of unclear etiology, but an MRI is confirmed multifocal infarcts.  LKW: A week ago IV Thrombolysis: Outside of window EVT: Outside of window   NIHSS components Score: Comment  1a Level of Conscious 0[x]  1[]  2[]  3[]      1b LOC Questions 0[]  1[x]  2[]       1c LOC Commands 0[x]  1[]  2[]       2 Best Gaze 0[x]  1[]  2[]       3 Visual 0[x]  1[]  2[]  3[]      4 Facial Palsy 0[]  1[x]  2[]  3[]      5a Motor Arm - left 0[]  1[x]  2[]  3[]  4[]  UN[]    5b Motor Arm - Right 0[x]  1[]  2[]  3[]  4[]  UN[]    6a Motor Leg - Left 0[]  1[x]  2[]  3[]  4[]  UN[]    6b Motor Leg - Right 0[x]  1[]  2[]  3[]  4[]  UN[]    7 Limb Ataxia 0[x]  1[]  2[]  3[]  UN[]     8 Sensory 0[x]  1[]  2[]  UN[]      9 Best Language 0[x]  1[]  2[]  3[]      10 Dysarthria 0[x]  1[]  2[]  UN[]      11 Extinct. and Inattention 0[x]  1[]  2[]       TOTAL: 4      Past History   Past Medical History:  Diagnosis Date   Anemia    Diabetes mellitus without complication (HCC) 1987   Family history of breast cancer    Hypertension    Myocardial infarction [facility])    Prostate cancer [facility])     Past Surgical History:  Procedure Laterality Date   DENTAL SURGERY     RADIOACTIVE SEED IMPLANT N/A 02/04/2021   Procedure: RADIOACTIVE SEED IMPLANT/BRACHYTHERAPY IMPLANT;  Surgeon: Jannifer Hick, MD;  Location: [facility];   Service: Urology;  Laterality: N/A;   RIGHT/LEFT HEART CATH AND CORONARY ANGIOGRAPHY N/A 05/25/2020   Procedure: RIGHT/LEFT HEART CATH AND CORONARY ANGIOGRAPHY;  Surgeon: Swaziland, Peter M, MD;  Location: [facility] INVASIVE CV LAB;  Service: Cardiovascular;  Laterality: N/A;   RIGHT/LEFT HEART CATH AND CORONARY ANGIOGRAPHY N/A 10/14/2022   Procedure: RIGHT/LEFT HEART CATH AND CORONARY ANGIOGRAPHY;  Surgeon: Dolores Patty, MD;  Location: [facility] INVASIVE CV LAB;  Service: Cardiovascular;  Laterality: N/A;   SPACE OAR INSTILLATION N/A 02/04/2021   Procedure: SPACE OAR INSTILLATION;  Surgeon: Jannifer Hick, MD;  Location: [facility];  Service: Urology;  Laterality: N/A;    Family History: Family History  Problem Relation Age of Onset   Breast cancer Mother        dx 30s/40s, twice   Cancer Maternal Grandfather        unknown type, d. >50    Social History  reports that he quit smoking about 6 years ago. His smoking use included cigarettes. He started smoking about 49  years ago. He has a 43 pack-year smoking history. He has never used smokeless tobacco. He reports that he does not currently use alcohol. He reports that he does not currently use drugs.  No Known Allergies  Medications   Current Facility-Administered Medications:    [START ON 06/15/2023]  stroke: early stages of recovery book, , Does not apply, Once, Opyd, Timothy S, MD   0.9 %  sodium chloride infusion, 250 mL, Intravenous, Continuous, Opyd, Lavone Neri, MD, Last Rate: 10 mL/hr at 06/14/23 0540, 250 mL at 06/14/23 0540   acetaminophen (TYLENOL) tablet 650 mg, 650 mg, Oral, Q6H PRN **OR** acetaminophen (TYLENOL) suppository 650 mg, 650 mg, Rectal, Q6H PRN, Howerter, Justin B, DO   aspirin EC tablet 81 mg, 81 mg, Oral, Daily, Howerter, Justin B, DO   atorvastatin (LIPITOR) tablet 80 mg, 80 mg, Oral, Daily, Albustami, Flonnie Hailstone, MD   dextrose 50 % solution 0-50 mL, 0-50 mL, Intravenous, PRN, Tegeler, Canary Brim, MD   insulin aspart (novoLOG)  injection 0-9 Units, 0-9 Units, Subcutaneous, Q4H, Opyd, Lavone Neri, MD, 2 Units at 06/14/23 0524   [START ON 06/15/2023] insulin glargine-yfgn (SEMGLEE) injection 12 Units, 12 Units, Subcutaneous, Daily, Opyd, Timothy S, MD   magnesium sulfate IVPB 2 g 50 mL, 2 g, Intravenous, Once, Albustami, Flonnie Hailstone, MD   norepinephrine (LEVOPHED) 4mg  in (0.016 mg/mL) premix infusion, 2-10 mcg/min, Intravenous, Titrated, Opyd, Lavone Neri, MD, Last Rate: 26.3 mL/hr at 06/14/23 0459, 7 mcg/min at 06/14/23 0459   ondansetron (ZOFRAN) injection 4 mg, 4 mg, Intravenous, Q6H PRN, Howerter, Justin B, DO   Oral care mouth rinse, 15 mL, Mouth Rinse, PRN, Lorin Glass, MD   pantoprazole (PROTONIX) injection 40 mg, 40 mg, Intravenous, Daily, Opyd, Lavone Neri, MD, 40 mg at 06/14/23 0522   vancomycin (VANCOREADY) IVPB 1500 mg/300 mL, 1,500 mg, Intravenous, Once, Patrici Ranks, MD  Vitals   Vitals:   06/14/23 0458 06/14/23 0500 06/14/23 0520 06/14/23 0554  BP: 97/61 99/63 (!) 114/33 (!) 89/60  Pulse:   (!) 102 99  Resp: (!) 25 (!) 28 (!) 26 19  Temp:    98.2 F (36.8 C)  TempSrc:    Oral  SpO2:   99% 100%  Weight:   77.5 kg   Height:        Body mass index is 23.17 kg/m.  Physical Exam   Constitutional: Appears well-developed and well-nourished.  Neurologic Examination    Neuro: Mental Status: Patient is awake, alert, oriented to person, place, month, gives age as 53 No signs of aphasia or neglect Cranial Nerves: II: Visual Fields are full. Pupils are equal, round, and reactive to light.   III,IV, VI: EOMI without ptosis or diploplia.  V: Facial sensation is symmetric to temperature VII:  VIII: hearing is intact to voice X: Uvula elevates symmetrically XII: tongue is midline without atrophy or fasciculations.  Motor: Tone is normal. Bulk is normal. 5/5 strength was present on the right, he has mild 4/5 weakness of the left Sensory: Sensation is symmetric to light touch and temperature in  the arms and legs. Cerebellar: No clear ataxia       Labs/Imaging/Neurodiagnostic studies   CBC:  Recent Labs  Lab Jun 27, 2023 1940 06/27/2023 1958 Jun 27, 2023 1959  WBC 21.8*  --   --   NEUTROABS 20.5*  --   --   HGB 8.9* 10.2* 10.2*  HCT 27.9* 30.0* 30.0*  MCV 87.5  --   --   PLT 195  --   --  Basic Metabolic Panel:  Lab Results  Component Value Date   NA 127 (L) 06/13/2023   K 3.8 06/13/2023   CO2 19 (L) 06/13/2023   GLUCOSE 479 (H) 06/13/2023   BUN 20 06/13/2023   CREATININE 1.80 (H) 06/13/2023   CALCIUM 7.7 (L) 06/13/2023   GFRNONAA 42 (L) 06/13/2023   GFRAA 89 06/10/2020   Lipid Panel:  Lab Results  Component Value Date   LDLCALC 109 (H) 11/22/2022   HgbA1c:  Lab Results  Component Value Date   HGBA1C 13.3 (H) 06/13/2023   Urine Drug Screen: No results found for: "LABOPIA", "COCAINSCRNUR", "LABBENZ", "AMPHETMU", "THCU", "LABBARB"  Alcohol Level     Component Value Date/Time   ETH <10 06/13/2023 1939   INR  Lab Results  Component Value Date   INR 1.0 02/04/2021   APTT  Lab Results  Component Value Date   APTT 28 02/04/2021   AED levels: No results found for: "PHENYTOIN", "ZONISAMIDE", "LAMOTRIGINE", "LEVETIRACETA"   MRI Brain(Personally reviewed): Multifocal embolic appearing infarcts A1c 13.3  ASSESSMENT   Newt Kynard is a 63 y.o. male with multifocal infarcts, suspect embolic from a central source in nature.  He will need further workup with echocardiogram and telemetry.  He does appear to be septic from a gangrenous toe, and therefore endocarditis may need to be considered.  RECOMMENDATIONS  Aspirin 81 mg daily Echo, telemetry Lipids PT, OT, ST Management of sepsis, DKA per CCM ______________________________________________________________________    Signed, Ritta Slot, MD [facility]

## 2023-06-14 NOTE — Progress Notes (Signed)
  Echocardiogram 2D Echocardiogram has been performed.  Ocie Doyne RDCS 06/14/2023, 12:04 PM

## 2023-06-14 NOTE — Evaluation (Signed)
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 PMH: poorly controlled DM2, chronic diastolic CHF, chronic R sided heart failure, anemia of chronic disease, L bundle branch block   Clinical Impression  Pt admitted with above. PTA pt indep, living in a hotel, and working full time for [employer]. Pt presenting with flat affect, weakness, deconditioning, R foot edema with gangrene at 2nd toe. Per chart, pt most likely to go for amputation on R foot pending Dr. Audrie Lia consult. Pt currently requiring modA to stand and take side steps along EOB. Pt with noted impaired balance as well. Suspect pt will need inpatient rehab post d/c however will continue to reassess post surgery. Acute PT to cont to follow.        If plan is discharge home, recommend the following: A lot of help with walking and/or transfers;A lot of help with bathing/dressing/bathroom   Can travel by private vehicle   No    Equipment Recommendations  (TBD, most like a walker)  Recommendations for Other Services       Functional Status Assessment Patient has had a recent decline in their functional status and demonstrates the ability to make significant improvements in function in a reasonable and predictable amount of time.     Precautions / Restrictions Precautions Precautions: Fall Restrictions Weight Bearing Restrictions Per Provider Order: No      Mobility  Bed Mobility Overal bed mobility: Needs Assistance Bed Mobility: Supine to Sit, Sit to Supine     Supine to sit: Min assist Sit to supine: Min assist   General bed mobility comments: assist for BLEs    Transfers Overall transfer level: Needs assistance Equipment used: 2 person hand held  assist Transfers: Sit to/from Stand Sit to Stand: Mod assist, +2 physical assistance           General transfer comment: modA to power up, pt with posterior bias, pt stated "oh my balance" pt trying to pull up on PT/OTs hands requiring verbal cues to push down into hands of PT/OT    Ambulation/Gait               General Gait Details: completed 5 side steps to [facility], pt with decreased R LE WBing tolerance despite denying pain. noted instability and core weakness  Stairs            Wheelchair Mobility     Tilt Bed    Modified Rankin (Stroke Patients Only)       Balance Overall balance assessment: Needs assistance Sitting-balance support: Feet supported Sitting balance-Leahy Scale: Good     Standing balance support: Reliant on assistive device for balance, During functional activity Standing balance-Leahy Scale: Poor Standing balance comment: relaint on external support                             Pertinent Vitals/Pain Pain Assessment Pain Assessment: No/denies pain    Home Living Family/patient expects to be discharged to:: Private residence Living Arrangements: Alone [facility]) Available Help at Discharge:  (reports "possibly" when asked if he has people who could help him) Type of Home: Other(Comment) (hotel)         Home Layout: One level Home Equipment: None Additional Comments:  reports he may need to go to a different hotel at d/c    Prior Function Prior Level of Function : Needs assist             Mobility Comments: was not using AD ADLs Comments: ind, was working for a [employer]     Extremity/Trunk Assessment   Upper Extremity Assessment Upper Extremity Assessment: Defer to OT evaluation    Lower Extremity Assessment Lower Extremity Assessment: Generalized weakness;RLE deficits/detail RLE Deficits / Details: 2nd toe gangrene, swelling, erythema    Cervical / Trunk Assessment Cervical / Trunk  Assessment: Normal  Communication   Communication Communication: No apparent difficulties    Cognition Arousal: Alert Behavior During Therapy: Flat affect                           PT - Cognition Comments: pt able to follow commands, A&Ox4, Following commands: Intact       Cueing Cueing Techniques: Verbal cues     General Comments General comments (skin integrity, edema, etc.): VSS on RA, BP 96 systolic t/o EOB session    Exercises     Assessment/Plan    PT Assessment Patient needs continued PT services  PT Problem List Decreased strength;Decreased activity tolerance;Decreased balance;Decreased mobility;Decreased coordination       PT Treatment Interventions DME instruction;Gait training;Stair training;Functional mobility training;Therapeutic activities;Therapeutic exercise;Balance training    PT Goals (Current goals can be found in the Care Plan section)  Acute Rehab PT Goals Patient Stated Goal: didn't state PT Goal Formulation: With patient Time For Goal Achievement: 06/28/23 Potential to Achieve Goals: Good    Frequency Min 1X/week     Co-evaluation PT/OT/SLP Co-Evaluation/Treatment: Yes Reason for Co-Treatment: Complexity of the patient's impairments (multi-system involvement);For patient/therapist safety;To address functional/ADL transfers PT goals addressed during session: Mobility/safety with mobility OT goals addressed during session: ADL's and self-care       AM-PAC PT "6 Clicks" Mobility  Outcome Measure Help needed turning from your back to your side while in a flat bed without using bedrails?: A Little Help needed moving from lying on your back to sitting on the side of a flat bed without using bedrails?: A Little Help needed moving to and from a bed to a chair (including a wheelchair)?: A Little Help needed standing up from a chair using your arms (e.g., wheelchair or bedside chair)?: A Lot Help needed to walk in hospital room?:  Total Help needed climbing 3-5 steps with a railing? : Total 6 Click Score: 13    End of Session Equipment Utilized During Treatment: Gait belt Activity Tolerance: Patient tolerated treatment well Patient left: in bed;with call bell/phone within reach;with bed alarm set;with nursing/sitter in room Nurse Communication: Mobility status (RN present during session) PT Visit Diagnosis: Unsteadiness on feet (R26.81);Muscle weakness (generalized) (M62.81);Difficulty in walking, not elsewhere classified (R26.2)    Time: 1200-1220 PT Time Calculation (min) (ACUTE ONLY): 20 min   Charges:   PT Evaluation $PT Eval Moderate Complexity: 1 Mod   PT General Charges $$ ACUTE PT VISIT: 1 Visit         Lewis Shock, PT, DPT Acute Rehabilitation Services Secure chat preferred Office #: [phone]   Iona Hansen 06/14/2023, 1:48 PM

## 2023-06-14 NOTE — Progress Notes (Signed)
Pharmacy Antibiotic Note  Derek Thompson is a 63 y.o. male admitted on 06/13/2023 with  right foot infection .  Pharmacy has been consulted for Vancomycin dosing. WBC elevated, noted renal dysfunction.   Plan: Vancomycin 1500 mg IV x 1, then 1250 mg IV q24h >>>Estimated AUC: 528 Trend WBC, temp, renal function  F/U infectious work-up Drug levels as indicated   Height: 6' (182.9 cm) Weight: 77.5 kg (170 lb 13.7 oz) IBW/kg (Calculated) : 77.6  Temp (24hrs), Avg:97.9 F (36.6 C), Min:97.5 F (36.4 C), Max:98.2 F (36.8 C)  Recent Labs  Lab 06/13/23 1939 06/13/23 1940 06/13/23 1959 06/13/23 2228 06/13/23 2232 06/14/23 0452  WBC  --  21.8*  --   --   --   --   CREATININE 1.98*  --  1.90* 1.80*  --   --   LATICACIDVEN  --   --  2.6*  --  2.3* 8.7*    Estimated Creatinine Clearance: 46 mL/min (A) (by C-G formula based on SCr of 1.8 mg/dL (H)).    No Known Allergies  Abran Duke, PharmD, BCPS Clinical Pharmacist Phone: [phone]

## 2023-06-14 NOTE — Consult Note (Signed)
Reason for Consult:Right foot gangrene Referring Physician: Melody Comas Time called: 1610 Time at bedside: 0859   Lindwood Gitto is an 63 y.o. male.  HPI: Niraj presented to the ED yesterday with a 1w hx/o generalized weakness and was admitted. He was noted to have gangrene of the right foot and orthopedic surgery was consulted the next day. He notes it has been there for at least a month and he's just been treating it at home. He denies fevers, chills, sweats, N/V, pain, or prior foot ulcers.  Past Medical History:  Diagnosis Date   Anemia    Diabetes mellitus without complication (HCC) 1987   Family history of breast cancer    Hypertension    Myocardial infarction [facility])    Prostate cancer [facility])     Past Surgical History:  Procedure Laterality Date   DENTAL SURGERY     RADIOACTIVE SEED IMPLANT N/A 02/04/2021   Procedure: RADIOACTIVE SEED IMPLANT/BRACHYTHERAPY IMPLANT;  Surgeon: Jannifer Hick, MD;  Location: [facility];  Service: Urology;  Laterality: N/A;   RIGHT/LEFT HEART CATH AND CORONARY ANGIOGRAPHY N/A 05/25/2020   Procedure: RIGHT/LEFT HEART CATH AND CORONARY ANGIOGRAPHY;  Surgeon: Swaziland, Peter M, MD;  Location: [facility] INVASIVE CV LAB;  Service: Cardiovascular;  Laterality: N/A;   RIGHT/LEFT HEART CATH AND CORONARY ANGIOGRAPHY N/A 10/14/2022   Procedure: RIGHT/LEFT HEART CATH AND CORONARY ANGIOGRAPHY;  Surgeon: Dolores Patty, MD;  Location: [facility] INVASIVE CV LAB;  Service: Cardiovascular;  Laterality: N/A;   SPACE OAR INSTILLATION N/A 02/04/2021   Procedure: SPACE OAR INSTILLATION;  Surgeon: Jannifer Hick, MD;  Location: [facility];  Service: Urology;  Laterality: N/A;    Family History  Problem Relation Age of Onset   Breast cancer Mother        dx 30s/40s, twice   Cancer Maternal Grandfather        unknown type, d. >50    Social History:  reports that he quit smoking about 6 years ago. His smoking use included cigarettes. He started smoking about 49 years ago. He has a 43  pack-year smoking history. He has never used smokeless tobacco. He reports that he does not currently use alcohol. He reports that he does not currently use drugs.  Allergies: No Known Allergies  Medications: I have reviewed the patient's current medications.  Results for orders placed or performed during the hospital encounter of 06/13/23 (from the past 48 hours)  CBG monitoring, ED     Status: Abnormal   Collection Time: 06/13/23  6:47 PM  Result Value Ref Range   Glucose-Capillary 548 (HH) 70 - 99 mg/dL    Comment: Glucose reference range applies only to samples taken after fasting for at least 8 hours.  Beta-hydroxybutyric acid     Status: Abnormal   Collection Time: 06/13/23  7:27 PM  Result Value Ref Range   Beta-Hydroxybutyric Acid 1.04 (H) 0.05 - 0.27 mmol/L    Comment: Performed at [facility] Lab, [address], [place], [name] [address]  Brain natriuretic peptide     Status: Abnormal   Collection Time: 06/13/23  7:37 PM  Result Value Ref Range   B Natriuretic Peptide >4,500.0 (H) 0.0 - 100.0 pg/mL    Comment: Performed at [facility] Lab, [address], [place], [name] [address]  Type and screen [facility]     Status: None   Collection Time: 06/13/23  7:39 PM  Result Value Ref Range   ABO/RH(D) B POS  Antibody Screen NEG    Sample Expiration      06/16/2023,2359 Performed at [facility] Lab, [address], [place], [name] [address]   Lipase, blood     Status: None   Collection Time: 06/13/23  7:39 PM  Result Value Ref Range   Lipase 18 11 - 51 U/L    Comment: Performed at [facility] Lab, [address], [place], [name] [address]  TSH     Status: Abnormal   Collection Time: 06/13/23  7:39 PM  Result Value Ref Range   TSH 5.642 (H) 0.350 - 4.500 uIU/mL    Comment: Performed by a 3rd Generation assay with a functional sensitivity of <=0.01 uIU/mL. Performed at [facility] Lab, [address] [place], [name] [address]    Ethanol     Status: None   Collection Time: 06/13/23  7:39 PM  Result Value Ref Range   Alcohol, Ethyl (B) <10 <10 mg/dL    Comment: (NOTE) Lowest detectable limit for serum alcohol is 10 mg/dL.  For medical purposes only. Performed at [facility] Lab, [address], [place], [name] [address]   Comprehensive metabolic panel     Status: Abnormal   Collection Time: 06/13/23  7:39 PM  Result Value Ref Range   Sodium 126 (L) 135 - 145 mmol/L   Potassium 4.1 3.5 - 5.1 mmol/L   Chloride 91 (L) 98 - 111 mmol/L   CO2 19 (L) 22 - 32 mmol/L   Glucose, Bld 545 (HH) 70 - 99 mg/dL    Comment: CRITICAL RESULT CALLED TO, READ BACK BY AND VERIFIED WITH J.PERRY RN 2103 06/13/2023 BY G.GANADEN Glucose reference range applies only to samples taken after fasting for at least 8 hours.    BUN 19 8 - 23 mg/dL   Creatinine, Ser 2.95 (H) 0.61 - 1.24 mg/dL   Calcium 8.5 (L) 8.9 - 10.3 mg/dL   Total Protein 6.7 6.5 - 8.1 g/dL   Albumin 2.1 (L) 3.5 - 5.0 g/dL   AST 29 15 - 41 U/L   ALT 27 0 - 44 U/L   Alkaline Phosphatase 49 38 - 126 U/L   Total Bilirubin 0.7 0.0 - 1.2 mg/dL   GFR, Estimated 37 (L) >60 mL/min    Comment: (NOTE) Calculated using the CKD-EPI Creatinine Equation (2021)    Anion gap 16 (H) 5 - 15    Comment: Performed at [facility] Lab, [address] [place], [name] [address]  CBC with Differential     Status: Abnormal   Collection Time: 06/13/23  7:40 PM  Result Value Ref Range   WBC 21.8 (H) 4.0 - 10.5 K/uL   RBC 3.19 (L) 4.22 - 5.81 MIL/uL   Hemoglobin 8.9 (L) 13.0 - 17.0 g/dL   HCT 86.5 (L) 78.4 - 69.6 %   MCV 87.5 80.0 - 100.0 fL   MCH 27.9 26.0 - 34.0 pg   MCHC 31.9 30.0 - 36.0 g/dL   RDW 29.5 (H) 28.4 - 13.2 %   Platelets 195 150 - 400 K/uL   nRBC 0.0 0.0 - 0.2 %   Neutrophils Relative % 94 %   Neutro Abs 20.5 (H) 1.7 - 7.7 K/uL   Lymphocytes Relative 3 %   Lymphs Abs 0.6 (L) 0.7 - 4.0 K/uL   Monocytes Relative 2 %   Monocytes Absolute 0.5 0.1 - 1.0 K/uL    Eosinophils Relative 0 %   Eosinophils Absolute 0.0 0.0 - 0.5  K/uL   Basophils Relative 0 %   Basophils Absolute 0.0 0.0 - 0.1 K/uL   Immature Granulocytes 1 %   Abs Immature Granulocytes 0.24 (H) 0.00 - 0.07 K/uL    Comment: Performed at [facility] Lab, [address], [place], [name] [address]  Hemoglobin A1c     Status: Abnormal   Collection Time: 06/13/23  7:40 PM  Result Value Ref Range   Hgb A1c MFr Bld 13.3 (H) 4.8 - 5.6 %    Comment: (NOTE) Pre diabetes:          5.7%-6.4%  Diabetes:              >6.4%  Glycemic control for   <7.0% adults with diabetes    Mean Plasma Glucose 335.01 mg/dL    Comment: Performed at [facility] Lab, [address], [place], [name] [address]  ABO/Rh     Status: None   Collection Time: 06/13/23  7:49 PM  Result Value Ref Range   ABO/RH(D)      B POS Performed at [name] Lab, [address], [place], [name] [address]   I-Stat venous blood gas, [facility] ED, [facility], [facility])     Status: Abnormal   Collection Time: 06/13/23  7:58 PM  Result Value Ref Range   pH, Ven 7.486 (H) 7.25 - 7.43   pCO2, Ven 27.9 (L) 44 - 60 mmHg   pO2, Ven 44 32 - 45 mmHg   Bicarbonate 21.1 20.0 - 28.0 mmol/L   TCO2 22 22 - 32 mmol/L   O2 Saturation 84 %   Acid-base deficit 2.0 0.0 - 2.0 mmol/L   Sodium 128 (L) 135 - 145 mmol/L   Potassium 4.0 3.5 - 5.1 mmol/L   Calcium, Ion 0.98 (L) 1.15 - 1.40 mmol/L   HCT 30.0 (L) 39.0 - 52.0 %   Hemoglobin 10.2 (L) 13.0 - 17.0 g/dL   Sample type VENOUS   I-Stat CG4 Lactic Acid     Status: Abnormal   Collection Time: 06/13/23  7:59 PM  Result Value Ref Range   Lactic Acid, Venous 2.6 (HH) 0.5 - 1.9 mmol/L   Comment NOTIFIED PHYSICIAN   I-stat chem 8, ed     Status: Abnormal   Collection Time: 06/13/23  7:59 PM  Result Value Ref Range   Sodium 127 (L) 135 - 145 mmol/L   Potassium 4.1 3.5 - 5.1 mmol/L   Chloride 95 (L) 98 - 111 mmol/L   BUN 21 8 - 23 mg/dL   Creatinine, Ser 8.29 (H) 0.61 - 1.24 mg/dL   Glucose,  Bld 562 (HH) 70 - 99 mg/dL    Comment: Glucose reference range applies only to samples taken after fasting for at least 8 hours.   Calcium, Ion 0.97 (L) 1.15 - 1.40 mmol/L   TCO2 20 (L) 22 - 32 mmol/L   Hemoglobin 10.2 (L) 13.0 - 17.0 g/dL   HCT 13.0 (L) 86.5 - 78.4 %   Comment NOTIFIED PHYSICIAN   Resp panel by RT-PCR (RSV, Flu A&B, Covid) Anterior Nasal Swab     Status: None   Collection Time: 06/13/23  8:14 PM   Specimen: Anterior Nasal Swab  Result Value Ref Range   SARS Coronavirus 2 by RT PCR NEGATIVE NEGATIVE   Influenza A by PCR NEGATIVE NEGATIVE   Influenza B by PCR NEGATIVE NEGATIVE    Comment: (NOTE) The Xpert Xpress SARS-CoV-2/FLU/RSV plus assay is intended as an aid in the diagnosis of  influenza from Nasopharyngeal swab specimens and should not be used as a sole basis for treatment. Nasal washings and aspirates are unacceptable for Xpert Xpress SARS-CoV-2/FLU/RSV testing.  Fact Sheet for Patients: BloggerCourse.com  Fact Sheet for Healthcare Providers: SeriousBroker.it  This test is not yet approved or cleared by the [place] FDA and has been authorized for detection and/or diagnosis of SARS-CoV-2 by FDA under an Emergency Use Authorization (EUA). This EUA will remain in effect (meaning this test can be used) for the duration of the COVID-19 declaration under Section 564(b)(1) of the Act, 21 U.S.C. section 360bbb-3(b)(1), unless the authorization is terminated or revoked.     Resp Syncytial Virus by PCR NEGATIVE NEGATIVE    Comment: (NOTE) Fact Sheet for Patients: BloggerCourse.com  Fact Sheet for Healthcare Providers: SeriousBroker.it  This test is not yet approved or cleared by the [place] FDA and has been authorized for detection and/or diagnosis of SARS-CoV-2 by FDA under an Emergency Use Authorization (EUA). This EUA will remain in effect  (meaning this test can be used) for the duration of the COVID-19 declaration under Section 564(b)(1) of the Act, 21 U.S.C. section 360bbb-3(b)(1), unless the authorization is terminated or revoked.  Performed at [facility] Lab, [address] [place], [name] [address]   Ammonia     Status: None   Collection Time: 06/13/23  8:31 PM  Result Value Ref Range   Ammonia 23 9 - 35 umol/L    Comment: Performed at [facility] Lab, [address] [place], [name] [address]  CBG monitoring, ED     Status: Abnormal   Collection Time: 06/13/23  9:46 PM  Result Value Ref Range   Glucose-Capillary 462 (H) 70 - 99 mg/dL    Comment: Glucose reference range applies only to samples taken after fasting for at least 8 hours.  Basic metabolic panel     Status: Abnormal   Collection Time: 06/13/23 10:28 PM  Result Value Ref Range   Sodium 127 (L) 135 - 145 mmol/L   Potassium 3.8 3.5 - 5.1 mmol/L   Chloride 97 (L) 98 - 111 mmol/L   CO2 19 (L) 22 - 32 mmol/L   Glucose, Bld 479 (H) 70 - 99 mg/dL    Comment: Glucose reference range applies only to samples taken after fasting for at least 8 hours.   BUN 20 8 - 23 mg/dL   Creatinine, Ser 3.08 (H) 0.61 - 1.24 mg/dL   Calcium 7.7 (L) 8.9 - 10.3 mg/dL   GFR, Estimated 42 (L) >60 mL/min    Comment: (NOTE) Calculated using the CKD-EPI Creatinine Equation (2021)    Anion gap 11 5 - 15    Comment: Performed at [facility] Lab, [address], [place], [name] [address]  Osmolality     Status: Abnormal   Collection Time: 06/13/23 10:28 PM  Result Value Ref Range   Osmolality 302 (H) 275 - 295 mOsm/kg    Comment: Performed at [facility] Lab, [address], [place], [name] [address]  Magnesium     Status: Abnormal   Collection Time: 06/13/23 10:28 PM  Result Value Ref Range   Magnesium 1.5 (L) 1.7 - 2.4 mg/dL    Comment: Performed at [facility] Lab, [address], [place], [name] [address]  I-Stat CG4 Lactic Acid     Status: Abnormal    Collection Time: 06/13/23 10:32 PM  Result Value Ref Range   Lactic Acid, Venous 2.3 (HH) 0.5 -  1.9 mmol/L   Comment NOTIFIED PHYSICIAN   CBG monitoring, ED     Status: Abnormal   Collection Time: 06/13/23 10:40 PM  Result Value Ref Range   Glucose-Capillary 409 (H) 70 - 99 mg/dL    Comment: Glucose reference range applies only to samples taken after fasting for at least 8 hours.  CBG monitoring, ED     Status: Abnormal   Collection Time: 06/13/23 11:15 PM  Result Value Ref Range   Glucose-Capillary 347 (H) 70 - 99 mg/dL    Comment: Glucose reference range applies only to samples taken after fasting for at least 8 hours.  Glucose, capillary     Status: Abnormal   Collection Time: 06/14/23 12:11 AM  Result Value Ref Range   Glucose-Capillary 269 (H) 70 - 99 mg/dL    Comment: Glucose reference range applies only to samples taken after fasting for at least 8 hours.  Glucose, capillary     Status: Abnormal   Collection Time: 06/14/23  1:13 AM  Result Value Ref Range   Glucose-Capillary 181 (H) 70 - 99 mg/dL    Comment: Glucose reference range applies only to samples taken after fasting for at least 8 hours.  Glucose, capillary     Status: Abnormal   Collection Time: 06/14/23  2:29 AM  Result Value Ref Range   Glucose-Capillary 135 (H) 70 - 99 mg/dL    Comment: Glucose reference range applies only to samples taken after fasting for at least 8 hours.  Glucose, capillary     Status: Abnormal   Collection Time: 06/14/23  3:25 AM  Result Value Ref Range   Glucose-Capillary 113 (H) 70 - 99 mg/dL    Comment: Glucose reference range applies only to samples taken after fasting for at least 8 hours.  Glucose, capillary     Status: Abnormal   Collection Time: 06/14/23  4:17 AM  Result Value Ref Range   Glucose-Capillary 133 (H) 70 - 99 mg/dL    Comment: Glucose reference range applies only to samples taken after fasting for at least 8 hours.  Vitamin B12     Status: None   Collection  Time: 06/14/23  4:52 AM  Result Value Ref Range   Vitamin B-12 908 180 - 914 pg/mL    Comment: (NOTE) This assay is not validated for testing neonatal or myeloproliferative syndrome specimens for Vitamin B12 levels. Performed at [facility] Lab, [address], [place], [name] [address]   Culture, blood (Routine X 2) w Reflex to ID Panel     Status: None (Preliminary result)   Collection Time: 06/14/23  4:52 AM   Specimen: BLOOD  Result Value Ref Range   Specimen Description BLOOD RIGHT ANTECUBITAL    Special Requests      BOTTLES DRAWN AEROBIC AND ANAEROBIC Blood Culture results may not be optimal due to an inadequate volume of blood received in culture bottles   Culture      NO GROWTH < 12 HOURS Performed at [facility] Lab, [address], [place], [name] [address]    Report Status PENDING   Procalcitonin     Status: None   Collection Time: 06/14/23  4:52 AM  Result Value Ref Range   Procalcitonin 23.25 ng/mL    Comment:        Interpretation: PCT >= 10 ng/mL: Important systemic inflammatory response, almost exclusively due to severe bacterial sepsis or septic shock. (NOTE)       Sepsis PCT Algorithm  Lower Respiratory Tract                                      Infection PCT Algorithm    ----------------------------     ----------------------------         PCT < 0.25 ng/mL                PCT < 0.10 ng/mL          Strongly encourage             Strongly discourage   discontinuation of antibiotics    initiation of antibiotics    ----------------------------     -----------------------------       PCT 0.25 - 0.50 ng/mL            PCT 0.10 - 0.25 ng/mL               OR       >80% decrease in PCT            Discourage initiation of                                            antibiotics      Encourage discontinuation           of antibiotics    ----------------------------     -----------------------------         PCT >= 0.50 ng/mL              PCT 0.26 -  0.50 ng/mL                AND       <80% decrease in PCT             Encourage initiation of                                             antibiotics       Encourage continuation           of antibiotics    ----------------------------     -----------------------------        PCT >= 0.50 ng/mL                  PCT > 0.50 ng/mL               AND         increase in PCT                  Strongly encourage                                      initiation of antibiotics    Strongly encourage escalation           of antibiotics                                     -----------------------------  PCT <= 0.25 ng/mL                                                 OR                                        > 80% decrease in PCT                                      Discontinue / Do not initiate                                             antibiotics  Performed at [facility] Lab, [address] [place], [name] [address]   Lactic acid, plasma     Status: Abnormal   Collection Time: 06/14/23  4:52 AM  Result Value Ref Range   Lactic Acid, Venous 8.7 (HH) 0.5 - 1.9 mmol/L    Comment: CRITICAL RESULT CALLED TO, READ BACK BY AND VERIFIED WITH C.TERRY RN [phone] 06/14/2023 BY G.GANADEN Performed at [facility] Lab, [address] [place], [name] [address]   APTT     Status: None   Collection Time: 06/14/23  4:52 AM  Result Value Ref Range   aPTT 32 24 - 36 seconds    Comment: Performed at [facility] Lab, [address], [place], [name] [address]  Protime-INR     Status: Abnormal   Collection Time: 06/14/23  4:52 AM  Result Value Ref Range   Prothrombin Time 20.2 (H) 11.4 - 15.2 seconds   INR 1.7 (H) 0.8 - 1.2    Comment: Performed at [facility] Lab, [address] [place], [name] [address]  Ferritin     Status: None   Collection Time: 06/14/23  4:52 AM  Result Value Ref Range   Ferritin 264 24 - 336 ng/mL    Comment: Performed at  [facility] Lab, [address], [place], [name] [address]  Iron and TIBC     Status: Abnormal   Collection Time: 06/14/23  4:52 AM  Result Value Ref Range   Iron 22 (L) 45 - 182 ug/dL   TIBC 324 (L) 401 - 027 ug/dL   Saturation Ratios 14 (L) 17.9 - 39.5 %   UIBC 139 ug/dL    Comment: Performed at [facility] Lab, [address], [place], [name] [address]  Comprehensive metabolic panel     Status: Abnormal   Collection Time: 06/14/23  4:52 AM  Result Value Ref Range   Sodium 130 (L) 135 - 145 mmol/L   Potassium 3.9 3.5 - 5.1 mmol/L   Chloride 99 98 - 111 mmol/L   CO2 13 (L) 22 - 32 mmol/L   Glucose, Bld 195 (H) 70 - 99 mg/dL    Comment: Glucose reference range applies only to samples taken after fasting for at least 8 hours.   BUN 20 8 - 23 mg/dL   Creatinine, Ser 4.40 (H) 0.61 - 1.24 mg/dL   Calcium 8.0 (L) 8.9 -  10.3 mg/dL   Total Protein 6.0 (L) 6.5 - 8.1 g/dL   Albumin 1.8 (L) 3.5 - 5.0 g/dL   AST 26 15 - 41 U/L   ALT 22 0 - 44 U/L   Alkaline Phosphatase 41 38 - 126 U/L   Total Bilirubin 0.6 0.0 - 1.2 mg/dL   GFR, Estimated 41 (L) >60 mL/min    Comment: (NOTE) Calculated using the CKD-EPI Creatinine Equation (2021)    Anion gap 18 (H) 5 - 15    Comment: Performed at [facility] Lab, [address], [place], [name] [address]  Magnesium     Status: None   Collection Time: 06/14/23  4:52 AM  Result Value Ref Range   Magnesium 2.3 1.7 - 2.4 mg/dL    Comment: Performed at [facility] Lab, [address] [place], [name] [address]  Phosphorus     Status: Abnormal   Collection Time: 06/14/23  4:52 AM  Result Value Ref Range   Phosphorus 1.6 (L) 2.5 - 4.6 mg/dL    Comment: Performed at [facility] Lab, [address] [place], [name] [address]  Glucose, capillary     Status: Abnormal   Collection Time: 06/14/23  4:55 AM  Result Value Ref Range   Glucose-Capillary 139 (H) 70 - 99 mg/dL    Comment: Glucose reference range applies only to samples taken  after fasting for at least 8 hours.  Culture, blood (Routine X 2) w Reflex to ID Panel     Status: None (Preliminary result)   Collection Time: 06/14/23  5:00 AM   Specimen: BLOOD  Result Value Ref Range   Specimen Description BLOOD RIGHT ANTECUBITAL    Special Requests      BOTTLES DRAWN AEROBIC AND ANAEROBIC Blood Culture results may not be optimal due to an inadequate volume of blood received in culture bottles   Culture      NO GROWTH < 12 HOURS Performed at [facility] Lab, [address], [place], [name] [address]    Report Status PENDING   MRSA Next Gen by PCR, Nasal     Status: None   Collection Time: 06/14/23  5:15 AM   Specimen: Nasal Mucosa; Nasal Swab  Result Value Ref Range   MRSA by PCR Next Gen NOT DETECTED NOT DETECTED    Comment: (NOTE) The GeneXpert MRSA Assay (FDA approved for NASAL specimens only), is one component of a comprehensive MRSA colonization surveillance program. It is not intended to diagnose MRSA infection nor to guide or monitor treatment for MRSA infections. Test performance is not FDA approved in patients less than 60 years old. Performed at [facility] Lab, [address], [place], [name] [address]   Glucose, capillary     Status: Abnormal   Collection Time: 06/14/23  5:16 AM  Result Value Ref Range   Glucose-Capillary 162 (H) 70 - 99 mg/dL    Comment: Glucose reference range applies only to samples taken after fasting for at least 8 hours.  Glucose, capillary     Status: Abnormal   Collection Time: 06/14/23  7:41 AM  Result Value Ref Range   Glucose-Capillary 203 (H) 70 - 99 mg/dL    Comment: Glucose reference range applies only to samples taken after fasting for at least 8 hours.    DG CHEST PORT 1 VIEW Result Date: 06/14/2023 CLINICAL DATA:  Encounter for central line placement EXAM: PORTABLE CHEST 1 VIEW COMPARISON:  Yesterday FINDINGS: Left IJ line with  tip at the upper cavoatrial junction. No pneumothorax or new mediastinal  widening. There is stable cardiomegaly and bilateral hazy opacification of the chest. Layering pleural fluid is possible. Artifact from EKG leads. IMPRESSION: 1. New central line without complicating feature. 2. Stable aeration Electronically Signed   By: Tiburcio Pea M.D.   On: 06/14/2023 07:28   DG Foot 2 Views Right Result Date: 06/14/2023 CLINICAL DATA:  63 year old male with history of infection of the first and second toes of the right foot. EXAM: RIGHT FOOT - 2 VIEW COMPARISON:  No priors. FINDINGS: Two views of the right foot demonstrate lucent areas of osseous destruction involving the tuft of the distal phalanx of the great toe, as well as in the second toe involving both the distal and mid phalanges centered around the D IP joint. Overlying soft tissues are irregular with probable soft tissue gas. No other definite osseous lesions are noted. No acute displaced fracture or dislocation. IMPRESSION: 1. 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 Electronically Signed   By: Trudie Reed M.D.   On: 06/14/2023 06:42   MR BRAIN WO CONTRAST Result Date: 06/14/2023 CLINICAL DATA:  Mental status change, unknown cause EXAM: MRI HEAD WITHOUT CONTRAST TECHNIQUE: Multiplanar, multiecho pulse sequences of the brain and surrounding structures were obtained without intravenous contrast. COMPARISON:  CT head from today. FINDINGS: Brain: Acute right cerebellar infarct. Many small acute infarcts throughout the left frontal, parietal and occipital lobes. More confluent/larger acute or early subacute infarct in the posterior right frontal lobe. No mass occupying acute hemorrhage or midline shift. Remote cerebellar infarcts. No hydrocephalus. Vascular: Major arterial flow voids are maintained at the skull base. Skull and upper cervical spine: Normal marrow signal. Sinuses/Orbits: Clear  sinuses.  No acute orbital findings. IMPRESSION: Acute or early subacute confluent infarct in the right frontal lobe. Additional smaller acute infarcts in the right cerebellum, left frontal, parietal and occipital lobes. Given involvement of multiple vascular territories, consider an embolic etiology. Electronically Signed   By: Feliberto Harts M.D.   On: 06/14/2023 02:17   DG Chest Portable 1 View Result Date: 06/13/2023 CLINICAL DATA:  Cough, altered mental status EXAM: PORTABLE CHEST 1 VIEW COMPARISON:  08/18/2022 FINDINGS: Stable cardiomegaly. Aortic atherosclerotic calcification. Diffuse interstitial coarsening greatest in the mid and lower lungs bilaterally. No focal pneumonia. No pleural effusion or pneumothorax. IMPRESSION: Cardiomegaly with interstitial coarsening which may be due to edema or atypical infection. Electronically Signed   By: Minerva Fester M.D.   On: 06/13/2023 21:10   CT HEAD WO CONTRAST ( ) Result Date: 06/13/2023 CLINICAL DATA:  Neuro deficit, acute, stroke suspected Dizziness, altered mental status EXAM: CT HEAD WITHOUT CONTRAST TECHNIQUE: Contiguous axial images were obtained from the base of the skull through the vertex without intravenous contrast. RADIATION DOSE REDUCTION: This exam was performed according to the departmental dose-optimization program which includes automated exposure control, adjustment of the mA and/or kV according to patient size and/or use of iterative reconstruction technique. COMPARISON:  None Available. FINDINGS: Brain: Probably acute or subacute infarcts in the posterior right frontal lobe. Possible slight petechial hemorrhage without mass occupying acute hemorrhage. No significant mass effect. Midline shift. No hydrocephalus. No visible mass lesion. Remote bilateral cerebellar infarcts. Vascular: No hyperdense vessel.  Calcific atherosclerosis. Skull: No acute fracture. Sinuses/Orbits: Clear sinuses.  No acute orbital findings. Other: No mastoid  effusions. IMPRESSION: 1. Probably acute  or subacute infarcts in the posterior right frontal lobe. Recommend MRI. 2. Possible slight petechial hemorrhage without mass occupying acute hemorrhage. 3. No significant mass effect. Electronically Signed   By: Feliberto Harts M.D.   On: 06/13/2023 20:51    Review of Systems  HENT:  Negative for ear discharge, ear pain, hearing loss and tinnitus.   Eyes:  Negative for photophobia and pain.  Respiratory:  Negative for cough and shortness of breath.   Cardiovascular:  Negative for chest pain.  Gastrointestinal:  Negative for abdominal pain, nausea and vomiting.  Genitourinary:  Negative for dysuria, flank pain, frequency and urgency.  Musculoskeletal:  Negative for back pain, myalgias and neck pain.  Skin:  Positive for wound (Right foot).  Neurological:  Positive for weakness. Negative for dizziness and headaches.  Hematological:  Does not bruise/bleed easily.  Psychiatric/Behavioral:  The patient is not nervous/anxious.    Blood pressure 100/64, pulse 91, temperature 98 F (36.7 C), temperature source Oral, resp. rate 13, height 6' (1.829 m), weight 77.5 kg, SpO2 96%. Physical Exam Constitutional:      General: He is not in acute distress.    Appearance: He is well-developed. He is not diaphoretic.  HENT:     Head: Normocephalic and atraumatic.  Eyes:     General: No scleral icterus.       Right eye: No discharge.        Left eye: No discharge.     Conjunctiva/sclera: Conjunctivae normal.  Cardiovascular:     Rate and Rhythm: Normal rate and regular rhythm.  Pulmonary:     Effort: Pulmonary effort is normal. No respiratory distress.  Musculoskeletal:     Cervical back: Normal range of motion.  Feet:     Comments: RLE No traumatic wounds, ecchymosis, or rash  Edema great and 2nd toes, wet gangrene 2nd toe, foul odor  No ankle effusion  Sens SPN, TN intact, DPN absent  Motor EHL, ext, flex, evers 5/5  DP 1+, PT 0, No significant  edema Skin:    General: Skin is warm and dry.  Neurological:     Mental Status: He is alert.  Psychiatric:        Mood and Affect: Mood normal.        Behavior: Behavior normal.     Assessment/Plan: Right foot gangrene -- Will obtain ABI; if abnormal will need vascular consult. Dr. Lajoyce Corners to evaluate later today, likely amputation Friday.    Freeman Caldron, PA-C Orthopedic Surgery [phone] 06/14/2023, 9:06 AM

## 2023-06-14 NOTE — Progress Notes (Addendum)
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 Labs significant for glucose >500 and BHA 1.04. LA 2.6. BNP elevated 4500. WBC 21K. Per note, no focal neuro deficits.  Early this am, rapid response contacted for hypotension. Patient had received 1L NS and 1.6 LR. Started levophed.  Arrived in ICU on peripheral levophed with improved SBP 110s. On room air. No gross neuro deficits on video   eICU Interventions  Peripheral levophed orders placed Ground team contacted  Hypotension, undifferentiated cannot r/o sepsis - s/p IVF resuscitation. Caution with heart failure. Started on levophed. Echo ordered. On CAP coverage. Trend LA. F/u cultures  HHNK - elevated glucose, low BHA, AG 16 on admission. Insulin gtt, IVF, trending labs  Stroke - Hospitalist team had c/s Neuro and recommended MRI. If stroke confirmed, advised to consult Neuro formally   0620 AM Ground team documented gangrenous malodorous toe on right foot. Will need surgical evaluation. Currently weaned to levophed 2. Will sign out to day team at shift change for orthopedics consult.   Intervention Category Evaluation Type: New Patient Evaluation  Ariella Voit Mechele Collin 06/14/2023, 5:25 AM

## 2023-06-14 NOTE — Inpatient Diabetes Management (Signed)
Inpatient Diabetes Program Recommendations  AACE/ADA: New Consensus Statement on Inpatient Glycemic Control (2015)  Target Ranges:  Prepandial:   less than 140 mg/dL      Peak postprandial:   less than 180 mg/dL (1-2 hours)      Critically ill patients:  140 - 180 mg/dL   Lab Results  Component Value Date   GLUCAP 228 (H) 06/14/2023   HGBA1C 13.3 (H) 06/13/2023    Review of Glycemic Control  Latest Reference Range & Units 06/14/23 04:17 06/14/23 04:55 06/14/23 05:16 06/14/23 07:41 06/14/23 11:21  Glucose-Capillary 70 - 99 mg/dL 161 (H) 096 (H) 045 (H) 203 (H) 228 (H)   Diabetes history: DM 2 Outpatient Diabetes medications:  Metformin 1000 mg bid, Jardiance 10 mg with breakfast- Patient not taking any of his DM medications prior to admit Current orders for Inpatient glycemic control:  Novolog 0-9 units q 4 hours Semglee 12 units daily  Inpatient Diabetes Program Recommendations:    Referral received for A1C>10%.  Per medication rec., patient stopped taking DM medications prior to admit.  Will need education and follow-up when appropriate.   Agree with current orders.  DM coordinator to follow-up when condition improves.  It appears that insulin was stopped by MD in July of 2024 as patient reported side effects from medication. A1C indicates that patient need to resume insulin at home to control blood sugars.    Thanks,  Lorenza Cambridge, RN, BC-ADM Inpatient Diabetes Coordinator Pager [phone]  (8a-5p)

## 2023-06-14 NOTE — Progress Notes (Addendum)
Patient was seen for hypotension.    Brief HPI: 63 yr old male with EF 25%, CAD, and T2DM presents to ED evening of 06/13/23 with weakness and fatigue.    ED workup:   Labs: Glucose 545, SCr 1.98 (0.99 in July '24), BNP >4,5000, WBC 21,800, lactate 2.6, negative COVID/RSV/flu.   CXR: Cardiomegaly and interstitial coarsening.   EKG: Chronic LBBB, prolonged QT.   MRI brain: Acute or subacute infarcts in multiple vascular territories.   Treatments: 1 liter NS, 1.6 liter LR, IV insulin infusion (now transitioned to sq), Rocephin, and azithromycin.    Subjective: Weakness. No chest pain.    Objective: BP 66/53, HR 60s, RR 20s, SpO2 100% on nasal canula  Pale, cool extremities, no diaphoresis. Fine rales b/l. RRR. Abdomen soft and non-tender. Trace pretibial edema.    Assessment & Plan:  1. Shock  - Suspected cardiogenic, possibly septic  - Did not respond to fluid-resuscitation  - Start Levophed via peripheral IV for now to keep MAP >65 - PCCM paged   2. Subacute CVA - Multiple vascular territories involved   - NPO pending swallow screen, frequent neuro checks, echocardiogram, lipid panel, therapy assessments    3. Chronic HFrEF   - EF 25% in April 2024  - Restrict fluids, follow daily wt and strict I/Os  - Echo o   4. HHNK; T2DM  - A1c 13.3% in ED   - In process of transitioning off IV insulin to sq

## 2023-06-15 DIAGNOSIS — I513 Intracardiac thrombosis, not elsewhere classified: Secondary | ICD-10-CM | POA: Diagnosis not present

## 2023-06-15 DIAGNOSIS — E1100 Type 2 diabetes mellitus with hyperosmolarity without nonketotic hyperglycemic-hyperosmolar coma (NKHHC): Secondary | ICD-10-CM | POA: Diagnosis not present

## 2023-06-15 DIAGNOSIS — E860 Dehydration: Secondary | ICD-10-CM

## 2023-06-15 DIAGNOSIS — L02611 Cutaneous abscess of right foot: Secondary | ICD-10-CM | POA: Diagnosis not present

## 2023-06-15 DIAGNOSIS — M86671 Other chronic osteomyelitis, right ankle and foot: Secondary | ICD-10-CM

## 2023-06-15 DIAGNOSIS — A419 Sepsis, unspecified organism: Secondary | ICD-10-CM | POA: Diagnosis not present

## 2023-06-15 DIAGNOSIS — I70261 Atherosclerosis of native arteries of extremities with gangrene, right leg: Secondary | ICD-10-CM | POA: Diagnosis not present

## 2023-06-15 DIAGNOSIS — I63421 Cerebral infarction due to embolism of right anterior cerebral artery: Secondary | ICD-10-CM | POA: Diagnosis not present

## 2023-06-15 DIAGNOSIS — M869 Osteomyelitis, unspecified: Secondary | ICD-10-CM

## 2023-06-15 DIAGNOSIS — R6521 Severe sepsis with septic shock: Secondary | ICD-10-CM | POA: Diagnosis not present

## 2023-06-15 DIAGNOSIS — E1152 Type 2 diabetes mellitus with diabetic peripheral angiopathy with gangrene: Secondary | ICD-10-CM | POA: Diagnosis not present

## 2023-06-15 DIAGNOSIS — Z794 Long term (current) use of insulin: Secondary | ICD-10-CM | POA: Diagnosis not present

## 2023-06-15 LAB — BASIC METABOLIC PANEL
Anion gap: 10 (ref 5–15)
BUN: 17 mg/dL (ref 8–23)
CO2: 20 mmol/L — ABNORMAL LOW (ref 22–32)
Calcium: 7.7 mg/dL — ABNORMAL LOW (ref 8.9–10.3)
Chloride: 105 mmol/L (ref 98–111)
Creatinine, Ser: 1.25 mg/dL — ABNORMAL HIGH (ref 0.61–1.24)
GFR, Estimated: >60 mL/min (ref >60)
Glucose, Bld: 162 mg/dL — ABNORMAL HIGH (ref 70–99)
Potassium: 3.4 mmol/L — ABNORMAL LOW (ref 3.5–5.1)
Sodium: 135 mmol/L (ref 135–145)

## 2023-06-15 LAB — CBC
HCT: 25.1 % — ABNORMAL LOW (ref 39.0–52.0)
Hemoglobin: 8.1 g/dL — ABNORMAL LOW (ref 13.0–17.0)
MCH: 27.6 pg (ref 26.0–34.0)
MCHC: 32.3 g/dL (ref 30.0–36.0)
MCV: 85.4 fL (ref 80.0–100.0)
Platelets: 127 10*3/uL — ABNORMAL LOW (ref 150–400)
RBC: 2.94 MIL/uL — ABNORMAL LOW (ref 4.22–5.81)
RDW: 17 % — ABNORMAL HIGH (ref 11.5–15.5)
WBC: 15.2 10*3/uL — ABNORMAL HIGH (ref 4.0–10.5)
nRBC: 0 % (ref 0.0–0.2)

## 2023-06-15 LAB — GLUCOSE, CAPILLARY
Glucose-Capillary: 169 mg/dL — ABNORMAL HIGH (ref 70–99)
Glucose-Capillary: 143 mg/dL — ABNORMAL HIGH (ref 70–99)
Glucose-Capillary: 146 mg/dL — ABNORMAL HIGH (ref 70–99)
Glucose-Capillary: 224 mg/dL — ABNORMAL HIGH (ref 70–99)
Glucose-Capillary: 214 mg/dL — ABNORMAL HIGH (ref 70–99)
Glucose-Capillary: 228 mg/dL — ABNORMAL HIGH (ref 70–99)
Glucose-Capillary: 219 mg/dL — ABNORMAL HIGH (ref 70–99)

## 2023-06-15 LAB — T4, FREE: Free T4: 1.38 ng/dL — ABNORMAL HIGH (ref 0.61–1.12)

## 2023-06-15 LAB — MAGNESIUM: Magnesium: 2.1 mg/dL (ref 1.7–2.4)

## 2023-06-15 LAB — IRON AND TIBC
Iron: 21 ug/dL — ABNORMAL LOW (ref 45–182)
Saturation Ratios: 14 % — ABNORMAL LOW (ref 17.9–39.5)
TIBC: 150 ug/dL — ABNORMAL LOW (ref 250–450)
UIBC: 129 ug/dL

## 2023-06-15 LAB — HEPARIN LEVEL (UNFRACTIONATED)
Heparin Unfractionated: <0.1 [IU]/mL — ABNORMAL LOW (ref 0.30–0.70)
Heparin Unfractionated: 0.19 [IU]/mL — ABNORMAL LOW (ref 0.30–0.70)

## 2023-06-15 LAB — LIPID PANEL
Cholesterol: 85 mg/dL (ref 0–200)
HDL: 10 mg/dL — ABNORMAL LOW (ref >40)
LDL Cholesterol: 49 mg/dL (ref 0–99)
Total CHOL/HDL Ratio: 8.5 {ratio}
Triglycerides: 131 mg/dL (ref <150)
VLDL: 26 mg/dL (ref 0–40)

## 2023-06-15 LAB — FOLATE: Folate: 7.2 ng/mL (ref >5.9)

## 2023-06-15 LAB — PHOSPHORUS: Phosphorus: 2.8 mg/dL (ref 2.5–4.6)

## 2023-06-15 LAB — VANCOMYCIN, RANDOM: Vancomycin Rm: 18 ug/mL

## 2023-06-15 LAB — FERRITIN: Ferritin: 222 ng/mL (ref 24–336)

## 2023-06-15 LAB — VITAMIN B12: Vitamin B-12: 871 pg/mL (ref 180–914)

## 2023-06-15 MED ORDER — POTASSIUM CHLORIDE CRYS ER 20 MEQ PO TBCR: 40.0000 meq | EXTENDED_RELEASE_TABLET | Freq: Once | ORAL | Status: DC

## 2023-06-15 MED ORDER — HEPARIN (PORCINE) 25000 UT/250ML-% IV SOLN: 1850.0000 [IU]/h | INTRAVENOUS | Status: DC

## 2023-06-15 MED ORDER — POTASSIUM CHLORIDE 10 MEQ/50ML IV SOLN
10.0000 meq | INTRAVENOUS | Status: DC
  Administered 2023-06-15: 10 meq via INTRAVENOUS
  Filled 2023-06-15 (×3): qty 50

## 2023-06-15 MED ORDER — METOPROLOL TARTRATE 12.5 MG HALF TABLET
12.5000 mg | ORAL_TABLET | ORAL | Status: AC
  Administered 2023-06-15: 12.5 mg via ORAL
  Filled 2023-06-15: qty 1

## 2023-06-15 MED ORDER — DIGOXIN 0.25 MG/ML IJ SOLN
0.1250 mg | Freq: Once | INTRAMUSCULAR | Status: AC
  Administered 2023-06-15: 0.125 mg via INTRAVENOUS
  Filled 2023-06-15: qty 0.5

## 2023-06-15 MED ORDER — HEPARIN BOLUS VIA INFUSION
2000.0000 [IU] | Freq: Once | INTRAVENOUS | Status: AC
  Administered 2023-06-15: 2000 [IU] via INTRAVENOUS
  Filled 2023-06-15: qty 2000

## 2023-06-15 MED ORDER — DIGOXIN 125 MCG PO TABS
0.1250 mg | ORAL_TABLET | Freq: Every day | ORAL | Status: DC
  Administered 2023-06-15 – 2023-06-25 (×11): 0.125 mg via ORAL
  Filled 2023-06-15 (×11): qty 1

## 2023-06-15 MED ORDER — METOPROLOL TARTRATE 12.5 MG HALF TABLET
12.5000 mg | ORAL_TABLET | Freq: Two times a day (BID) | ORAL | Status: DC
  Administered 2023-06-15: 12.5 mg via ORAL
  Filled 2023-06-15: qty 1

## 2023-06-15 MED ORDER — DIGOXIN 0.25 MG/ML IJ SOLN
0.1250 mg | INTRAMUSCULAR | Status: AC
  Administered 2023-06-15: 0.125 mg via INTRAVENOUS
  Filled 2023-06-15: qty 0.5

## 2023-06-15 MED ORDER — METOPROLOL TARTRATE 12.5 MG HALF TABLET
12.5000 mg | ORAL_TABLET | Freq: Once | ORAL | Status: AC
  Administered 2023-06-16: 12.5 mg via ORAL
  Filled 2023-06-15: qty 1

## 2023-06-15 MED ORDER — HEPARIN (PORCINE) 25000 UT/250ML-% IV SOLN
2000.0000 [IU]/h | INTRAVENOUS | Status: AC
  Administered 2023-06-15: 1850 [IU]/h via INTRAVENOUS

## 2023-06-15 MED ORDER — SODIUM CHLORIDE 0.9 % IV SOLN
2.0000 g | Freq: Three times a day (TID) | INTRAVENOUS | Status: DC
  Administered 2023-06-15 – 2023-06-22 (×20): 2 g via INTRAVENOUS
  Filled 2023-06-15 (×21): qty 12.5

## 2023-06-15 MED ORDER — HEPARIN (PORCINE) 25000 UT/250ML-% IV SOLN
1850.0000 [IU]/h | INTRAVENOUS | Status: AC
  Administered 2023-06-15: 1400 [IU]/h via INTRAVENOUS
  Administered 2023-06-15: 1850 [IU]/h via INTRAVENOUS
  Filled 2023-06-15: qty 250

## 2023-06-15 MED ORDER — VANCOMYCIN HCL 1500 MG/300ML IV SOLN
1500.0000 mg | INTRAVENOUS | Status: DC
Start: 2023-06-15 — End: 2023-06-19
  Administered 2023-06-15 – 2023-06-18 (×4): 1500 mg via INTRAVENOUS
  Filled 2023-06-15 (×4): qty 300

## 2023-06-15 MED ORDER — POTASSIUM CHLORIDE CRYS ER 20 MEQ PO TBCR
40.0000 meq | EXTENDED_RELEASE_TABLET | Freq: Once | ORAL | Status: AC
  Administered 2023-06-15: 40 meq via ORAL
  Filled 2023-06-15: qty 2

## 2023-06-15 MED ORDER — METOPROLOL TARTRATE 25 MG PO TABS
25.0000 mg | ORAL_TABLET | Freq: Two times a day (BID) | ORAL | Status: DC
  Administered 2023-06-16 – 2023-06-20 (×6): 25 mg via ORAL
  Filled 2023-06-15 (×6): qty 1

## 2023-06-15 NOTE — Inpatient Diabetes Management (Signed)
Inpatient Diabetes Program Recommendations  AACE/ADA: New Consensus Statement on Inpatient Glycemic Control (2015)  Target Ranges:  Prepandial:   less than 140 mg/dL      Peak postprandial:   less than 180 mg/dL (1-2 hours)      Critically ill patients:  140 - 180 mg/dL   Lab Results  Component Value Date   GLUCAP 146 (H) 06/15/2023   HGBA1C 13.3 (H) 06/13/2023    Review of Glycemic Control  Latest Reference Range & Units 06/15/23 03:49 06/15/23 07:53 06/15/23 11:22  Glucose-Capillary 70 - 99 mg/dL 161 (H) 096 (H) 045 (H)  (H): Data is abnormally high Diabetes history: Type 2 DM Outpatient Diabetes medications: Metformin 1000 mg BID, Jardiance 10 mg every day, Lantus (NT) Current orders for Inpatient glycemic control: Semglee 12 units every day, Novolog 0-9 units Q4H  Inpatient Diabetes Program Recommendations:    Spoke with patient regarding outpatient diabetes management. Patient stopped taking Lantus after "I got sick". When asked to about symptoms and time frame patient unable to further elaborate. Based on discussion, question if patient experienced hypoglycemia?  Reviewed patient's current A1c of 13.3%. Explained what a A1c is and what it measures. Also reviewed goal A1c with patient, importance of good glucose control @ home, and blood sugar goals. Reviewed patho of DM, DKA, need for insulin, role of pancreas, survival skills, interventions, hypoglycemia, vascular changes and commorbidities.  Patient has a meter and testing supplies. Has not used since administering insulin. Reviewed recommendations for checking CBGs and when to call MD. Patient is not interested in CGM at this time. Admits to drinking juices recently and knows that he could improve. Reviewed alternatives and importance of being mindful of CHo intake.   Thanks, Lujean Rave, MSN, RNC-OB Diabetes Coordinator [phone] (8a-5p)

## 2023-06-15 NOTE — Progress Notes (Signed)
STROKE TEAM PROGRESS NOTE   SIGNIFICANT HOSPITAL EVENTS 2/12 -admit to ICU  INTERIM HISTORY/SUBJECTIVE Patient is doing better.  Septic shock is resolved lactic acid is still high.  He is off vasopressors.  He is on antibiotics and plan for to amputation in the next few days.  Remains on IV heparin for LV thrombus. Neurological exam is unchanged.  CT head and neck on hold due to mild renal insufficiency.  Serum creatinine improved today to 1.25 OBJECTIVE  CBC    Component Value Date/Time   WBC 15.2 (H) 06/15/2023 0414   RBC 2.94 (L) 06/15/2023 0414   HGB 8.1 (L) 06/15/2023 0414   HCT 25.1 (L) 06/15/2023 0414   PLT 127 (L) 06/15/2023 0414   MCV 85.4 06/15/2023 0414   MCH 27.6 06/15/2023 0414   MCHC 32.3 06/15/2023 0414   RDW 17.0 (H) 06/15/2023 0414   LYMPHSABS 0.6 (L) 06/13/2023 1940   MONOABS 0.5 06/13/2023 1940   EOSABS 0.0 06/13/2023 1940   BASOSABS 0.0 06/13/2023 1940    BMET    Component Value Date/Time   NA 135 06/15/2023 0414   NA 135 11/22/2022 1445   K 3.4 (L) 06/15/2023 0414   CL 105 06/15/2023 0414   CO2 20 (L) 06/15/2023 0414   GLUCOSE 162 (H) 06/15/2023 0414   BUN 17 06/15/2023 0414   BUN 24 11/22/2022 1445   CREATININE 1.25 (H) 06/15/2023 0414   CALCIUM 7.7 (L) 06/15/2023 0414   EGFR 86 11/22/2022 1445   GFRNONAA >60 06/15/2023 0414    IMAGING past 24 hours No results found.   Vitals:   06/15/23 1119 06/15/23 1128 06/15/23 1132 06/15/23 1200  BP:   (!) 85/62 (!) 78/60  Pulse:  (!) 157 (!) 155 (!) 157  Resp:    (!) 22  Temp: 98 F (36.7 C)     TempSrc: Oral     SpO2:    98%  Weight:      Height:         PHYSICAL EXAM General:  Alert, malnourished looking middle-aged African-American male patient in no acute distress Psych:  Mood and affect appropriate for situation CV: Regular rate and rhythm on monitor Respiratory:  Regular, unlabored respirations on room air GI: Abdomen soft and nontender Gangrene right foot toes  NEURO:  Mental  Status: AA&Ox3, patient is able to give clear and coherent history Speech/Language: speech is without dysarthria or aphasia.  Naming, repetition, fluency, and comprehension intact.  Cranial Nerves:  II: PERRL.  Left field cut III, IV, VI: EOMI. Eyelids elevate symmetrically.  V: Sensation is intact to light touch and symmetrical to face.  VII: Face is symmetrical resting and smiling VIII: hearing intact to voice. IX, X: Palate elevates symmetrically. Phonation is normal.  ZO:[id number] shrug 5/5. XII: tongue is midline without fasciculations. Motor: Left grip is slightly weaker. Slowed RAM on the left.  Tone: is normal and bulk is normal Sensation- Intact to light touch bilaterally. Extinction absent to light touch to DSS.   Coordination: FTN intact bilaterally, HKS: no ataxia in BLE.No drift.  Gait- deferred  Most Recent NIH 3     ASSESSMENT/PLAN  [name] is a 63 y.o. male with history of HTN, DM, CHF admitted for 1 week of altered mental status.  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  Additionally he has gangrene and osteo in his right lower extremity (first and second toe on the right foot) with  further workup underway.  He will likely require an amputation on Friday with the orthopedic team.  Vascular surgery is additionally planning for a right lower extremity angiogram tomorrow.  NIH on Admission 4  Acute Ischemic Infarct:  right frontal infarct  Etiology:  embolic with apical mass likely thrombus and low EF on echo CTA head & neck pending MRI  Acute or early subacute confluent infarct in the right frontal lobe. Additional smaller acute infarcts in the right cerebellum, left frontal, parietal and occipital lobes.  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 Yes; Fluid consistency: Thin   Diet NPO time specified   Advance diet as tolerated  Other Stroke Risk Factors Obesity, Body mass index is 23.17 kg/m., BMI >/= 30 associated with increased stroke risk, recommend weight loss, diet and exercise as appropriate    Hospital day # 2  Patient admitted with generalized weakness, gangrenous changes in the right second toe and hypotension and hyperglycemia and acidosis.  MRI scan of the brain showed bilateral embolic infarcts likely from cardiogenic etiology from LV thrombus and cardiomyopathy.  Recommend anticoagulation with IV heparin and continue ongoing stroke workup and aggressive risk factor modification.  Okay to postpone CT angiogram due to borderline renal function and findings are unlikely to impact treatment decision at this time.  Maintain aggressive hydration if cardiac situation allows..  Treatment of gangrene with antibiotics and amputation as per CCM and vascular surgery.   D/w Dr Francine Graven  This patient is critically ill and at significant risk of neurological worsening, death and care requires constant monitoring of vital signs,  hemodynamics,respiratory and cardiac monitoring, extensive review of multiple databases, frequent neurological assessment, discussion with family, other specialists and medical decision making of high complexity.I have made any additions or clarifications directly to the above note.This critical care time does not reflect procedure time, or teaching time or supervisory time of PA/NP/Med Resident etc but could involve care discussion time.  I spent 30 minutes of neurocritical care time  in the care of  this patient.       Delia Heady, MD Medical Director [facility] Pager: [phone] 06/15/2023 1:44 PM   To contact Stroke Continuity provider, please refer to [email or web address]. After hours, contact General Neurology

## 2023-06-15 NOTE — Progress Notes (Signed)
Progress Note  Patient Name: Derek Thompson Date of Encounter: 06/15/2023  Primary Cardiologist: [name], MD   Subjective   Patient seen and examined at his bedside. He was awake when I arrived. No longer on levophed gtt  Inpatient Medications    Scheduled Meds:   stroke: early stages of recovery book   Does not apply Once   aspirin EC  81 mg Oral Daily   atorvastatin  80 mg Oral Daily   Chlorhexidine Gluconate Cloth  6 each Topical Daily   insulin aspart  0-9 Units Subcutaneous Q4H   insulin glargine-yfgn  12 Units Subcutaneous Daily   metroNIDAZOLE  500 mg Oral Q12H   pantoprazole  40 mg Oral Daily   phosphorus  500 mg Oral Q4H   potassium chloride  40 mEq Oral Once   sodium chloride flush  10-40 mL Intracatheter Q12H   vancomycin variable dose per unstable renal function (pharmacist dosing)   Does not apply See admin instructions   Continuous Infusions:  ceFEPime (MAXIPIME) IV 2 g (06/14/23 2222)   heparin 1,400 Units/hr (06/14/23 2115)   norepinephrine (LEVOPHED) Adult infusion Stopped (06/14/23 1150)   PRN Meds: acetaminophen **OR** acetaminophen, ondansetron (ZOFRAN) IV, mouth rinse, sodium chloride flush   Vital Signs    Vitals:   06/15/23 0400 06/15/23 0500 06/15/23 0600 06/15/23 0751  BP: 109/65 113/67 109/69   Pulse: 97 91 90   Resp: 19 19 20    Temp:    98.4 F (36.9 C)  TempSrc:    Oral  SpO2: 97% 96% 95%   Weight:  77.5 kg    Height:        Intake/Output Summary (Last 24 hours) at 06/15/2023 0813 Last data filed at 06/15/2023 0600 Gross per 24 hour  Intake 512.77 ml  Output 1050 ml  Net -537.23 ml   Filed Weights   06/13/23 1851 06/14/23 0520 06/15/23 0500  Weight: 80.7 kg 77.5 kg 77.5 kg    Telemetry    Sinus rhythm  - Personally Reviewed  ECG    - Personally Reviewed  Physical Exam   General: Comfortable Head: Atraumatic, normal size  Eyes: PEERLA, EOMI  Neck: Supple, normal JVD Cardiac: Normal S1, S2; RRR; no murmurs,  rubs, or gallops Lungs: Clear to auscultation bilaterally Abd: Soft, nontender, no hepatomegaly  Ext: warm,left feet/toes gangrenous  Musculoskeletal: No deformities, BUE and BLE strength normal and equal Skin: Warm and dry, no rashes   Neuro: Alert and oriented to person, place, time, and situation, CNII-XII grossly intact, no focal deficits  Psych: Normal mood and affect   Labs    Chemistry Recent Labs  Lab 06/13/23 1939 06/13/23 1958 06/14/23 0452 06/14/23 1255 06/15/23 0414  NA 126*   < > 130* 130* 135  K 4.1   < > 3.9 4.0 3.4*  CL 91*   < > 99 102 105  CO2 19*   < > 13* 19* 20*  GLUCOSE 545*   < > 195* 229* 162*  BUN 19   < > 20 20 17   CREATININE 1.98*   < > 1.84* 1.51* 1.25*  CALCIUM 8.5*   < > 8.0* 7.7* 7.7*  PROT 6.7  --  6.0*  --   --   ALBUMIN 2.1*  --  1.8*  --   --   AST 29  --  26  --   --   ALT 27  --  22  --   --   [facility]  49  --  41  --   --   BILITOT 0.7  --  0.6  --   --   GFRNONAA 37*   < > 41* 52* >60  ANIONGAP 16*   < > 18* 9 10   < > = values in this interval not displayed.     Hematology Recent Labs  Lab 06/13/23 1940 06/13/23 1958 06/13/23 1959 06/15/23 0414  WBC 21.8*  --   --  15.2*  RBC 3.19*  --   --  2.94*  HGB 8.9* 10.2* 10.2* 8.1*  HCT 27.9* 30.0* 30.0* 25.1*  MCV 87.5  --   --  85.4  MCH 27.9  --   --  27.6  MCHC 31.9  --   --  32.3  RDW 16.8*  --   --  17.0*  PLT 195  --   --  127*    Cardiac EnzymesNo results for input(s): "TROPONINI" in the last 168 hours. No results for input(s): "TROPIPOC" in the last 168 hours.   BNP Recent Labs  Lab 06/13/23 1937  BNP >4,500.0*     DDimer No results for input(s): "DDIMER" in the last 168 hours.   Radiology    VAS [place] ABI WITH/WO TBI Result Date: 06/14/2023  LOWER EXTREMITY DOPPLER STUDY Patient Name:  CORRY IHNEN  Date of Exam:   06/14/2023 Medical Rec #: [id number]      Accession #:    [id number] Date of Birth: 01-13-1961      Patient Gender: M Patient Age:   56 years Exam  Location:  [facility] Procedure:      VAS [place] ABI WITH/WO TBI Referring Phys: MICHAEL JEFFERY --------------------------------------------------------------------------------  Indications: Ulceration, and gangrene. High Risk Factors: Diabetes, past history of smoking.  Comparison Study: No prior exam. Performing Technologist: Fernande Bras  Examination Guidelines: A complete evaluation includes at minimum, Doppler waveform signals and systolic blood pressure reading at the level of bilateral brachial, anterior tibial, and posterior tibial arteries, when vessel segments are accessible. Bilateral testing is considered an integral part of a complete examination. Photoelectric Plethysmograph (PPG) waveforms and toe systolic pressure readings are included as required and additional duplex testing as needed. Limited examinations for reoccurring indications may be performed as noted.  ABI Findings: +---------+------------------+-----+----------+--------+ Right    Rt Pressure (mmHg)IndexWaveform  Comment  +---------+------------------+-----+----------+--------+ Brachial 110                    triphasic          +---------+------------------+-----+----------+--------+ PTA      111               1.01 monophasic         +---------+------------------+-----+----------+--------+ DP       118               1.07 biphasic           +---------+------------------+-----+----------+--------+ Great Toe203               1.85 Abnormal           +---------+------------------+-----+----------+--------+ +---------+------------------+-----+---------+-------+ Left     Lt Pressure (mmHg)IndexWaveform Comment +---------+------------------+-----+---------+-------+ Brachial 110                    triphasic        +---------+------------------+-----+---------+-------+ PTA      116               1.05 triphasic        +---------+------------------+-----+---------+-------+  DP       109                0.99 biphasic         +---------+------------------+-----+---------+-------+ Great Toe71                0.65 Abnormal         +---------+------------------+-----+---------+-------+ +-------+-----------+-----------+------------+------------+ ABI/TBIToday's ABIToday's TBIPrevious ABIPrevious TBI +-------+-----------+-----------+------------+------------+ Right  1.07       1.85                                +-------+-----------+-----------+------------+------------+ Left   1.05       0.65                                +-------+-----------+-----------+------------+------------+  Summary: Right: Resting right ankle-brachial index is within normal range. The right toe-brachial index is abnormal. Left: Resting left ankle-brachial index is within normal range. The left toe-brachial index is abnormal. *See table(s) above for measurements and observations.  Electronically signed by Gerarda Fraction on 06/14/2023 at 4:06:40 PM.    Final    US RENAL Result Date: 06/14/2023 CLINICAL DATA:  [id number] Encephalopathy acute [id number] EXAM: RENAL / URINARY TRACT ULTRASOUND COMPLETE COMPARISON:  09/10/2020 FINDINGS: Right Kidney: Renal measurements: 11.8 x 4.9 x 4.8 cm = volume: 143 mL. Echogenicity within normal limits. No mass or hydronephrosis visualized. Left Kidney: Renal measurements: 10.6 x 4.4 x 4.3 cm = volume: 104 mL. Echogenicity within normal limits. No mass or hydronephrosis visualized. Bladder: Abnormally thickened appearance of the urinary bladder wall. Linear area of increased echogenicity anteriorly within the bladder with dirty posterior acoustic shadowing. Other: Small bilateral pleural effusions. IMPRESSION: 1. Abnormally thickened appearance of the urinary bladder wall. Linear area of increased echogenicity anteriorly within the bladder with dirty posterior acoustic shadowing. This may represent air within the bladder lumen, which could be secondary to recent instrumentation or  infection. Air within the bladder wall (emphysematous cystitis) is not excluded. Correlation with urinalysis is recommended. 2. Unremarkable sonographic appearance of the kidneys. 3. Small bilateral pleural effusions. Electronically Signed   By: Duanne Guess D.O.   On: 06/14/2023 13:40   ECHOCARDIOGRAM COMPLETE Result Date: 06/14/2023    ECHOCARDIOGRAM REPORT   Patient Name:   Jaxen Samples Date of Exam: 06/14/2023 Medical Rec #:  [id number]     Height:       72.0 in Accession #:    [id number]    Weight:       170.9 lb Date of Birth:  [birth year]     BSA:          1.993 m Patient Age:    62 years      BP:           100/66 mmHg Patient Gender: M             HR:           89 bpm. Exam Location:  Inpatient Procedure: 2D Echo, Cardiac Doppler, Color Doppler and Intracardiac            Opacification Agent (Both Spectral and Color Flow Doppler were            utilized during procedure). Indications:    Stroke, shock  History:        Patient has prior history of Echocardiogram examinations, most  recent 08/19/2022. CHF, CAD; Risk Factors:Hypertension, Diabetes,                 Dyslipidemia and Former Smoker.  Sonographer:    Vern Claude Referring Phys: Lavone Neri OPYD IMPRESSIONS  1. Large 3 x 3 cm round heterogenous mass at the LV apex which is suggestive of thrombus given severe LV dysfunction and less likely myxoma - the mass did not take up Definity contrast (appears avascular). Left ventricular ejection fraction, by estimation, is 20 to 25%. Left ventricular ejection fraction by 2D MOD biplane is 21.2 %. The left ventricle has severely decreased function. The left ventricle demonstrates global hypokinesis. The left ventricular internal cavity size was mildly dilated. Left ventricular diastolic parameters are consistent with Grade II diastolic dysfunction (pseudonormalization). Elevated left ventricular end-diastolic pressure.  2. Right ventricular systolic function is mildly reduced. The right  ventricular size is normal. There is mildly elevated pulmonary artery systolic pressure. The estimated right ventricular systolic pressure is 39.0 mmHg.  3. The mitral valve is abnormal. Moderate mitral valve regurgitation.  4. The aortic valve is tricuspid. Aortic valve regurgitation is not visualized.  5. The inferior vena cava is dilated in size with <50% respiratory variability, suggesting right atrial pressure of 15 mmHg. Comparison(s): Changes from prior study are noted. 08/16/2022: LVEF 25%, global hypokinesis. Conclusion(s)/Recommendation(s): Critical findings reported to Dr. Francine Graven and Elmer Picker, NP and acknowledged at 12:23 pm on 06/14/23. FINDINGS  Left Ventricle: Large 3 x 3 cm round heterogenous mass at the LV apex which is suggestive of thrombus given severe LV dysfunction and less likely myxoma - the mass did not take up Definity contrast (appears avascular). Left ventricular ejection fraction, by estimation, is 20 to 25%. Left ventricular ejection fraction by 2D MOD biplane is 21.2 %. The left ventricle has severely decreased function. The left ventricle demonstrates global hypokinesis. Definity contrast agent was given IV to delineate the left ventricular endocardial borders. Strain imaging was not performed. The left ventricular internal cavity size was mildly dilated. There is no left ventricular hypertrophy. Left ventricular diastolic parameters are consistent with Grade II diastolic dysfunction (pseudonormalization). Elevated left ventricular end-diastolic pressure. Right Ventricle: The right ventricular size is normal. No increase in right ventricular wall thickness. Right ventricular systolic function is mildly reduced. There is mildly elevated pulmonary artery systolic pressure. The tricuspid regurgitant velocity  is 2.45 m/s, and with an assumed right atrial pressure of 15 mmHg, the estimated right ventricular systolic pressure is 39.0 mmHg. Left Atrium: Left atrial size was normal in size.  Right Atrium: Right atrial size was normal in size. Pericardium: There is no evidence of pericardial effusion. Mitral Valve: The mitral valve is abnormal. There is mild thickening of the anterior and posterior mitral valve leaflet(s). Moderate mitral valve regurgitation. MV peak gradient, 4.0 mmHg. The mean mitral valve gradient is 2.0 mmHg. Tricuspid Valve: The tricuspid valve is grossly normal. Tricuspid valve regurgitation is mild. Aortic Valve: The aortic valve is tricuspid. Aortic valve regurgitation is not visualized. Aortic valve mean gradient measures 2.0 mmHg. Aortic valve peak gradient measures 3.3 mmHg. Aortic valve area, by VTI measures 2.01 cm. Pulmonic Valve: The pulmonic valve was grossly normal. Pulmonic valve regurgitation is trivial. Aorta: The aortic root and ascending aorta are structurally normal, with no evidence of dilitation. Venous: The inferior vena cava is dilated in size with less than 50% respiratory variability, suggesting right atrial pressure of 15 mmHg. IAS/Shunts: No atrial level shunt detected by color flow Doppler. Additional Comments:  3D imaging was not performed.  LEFT VENTRICLE PLAX 2D                        Biplane EF (MOD) LVIDd:         5.50 cm         LV Biplane EF:   Left LVIDs:         4.80 cm                          ventricular LV PW:         0.70 cm                          ejection LV IVS:        0.60 cm                          fraction by LVOT diam:     1.80 cm                          2D MOD LV SV:         29                               biplane is LV SV Index:   14                               21.2 %. LVOT Area:     2.54 cm                                Diastology                                LV e' medial:    5.98 cm/s LV Volumes (MOD)               LV E/e' medial:  16.1 LV vol d, MOD    226.1 ml      LV e' lateral:   9.03 cm/s A2C:                           LV E/e' lateral: 10.7 LV vol d, MOD    185.9 ml A4C: LV vol s, MOD    152.1 ml A2C: LV vol s, MOD     167.2 ml A4C: LV SV MOD A2C:   74.0 ml LV SV MOD A4C:   185.9 ml LV SV MOD BP:    47.0 ml RIGHT VENTRICLE            IVC RV Basal diam:  4.90 cm    IVC diam: 2.50 cm RV Mid diam:    3.50 cm RV S prime:     9.14 cm/s LEFT ATRIUM             Index        RIGHT ATRIUM           Index LA diam:        3.90 cm 1.96 cm/m   RA Area:     18.60  cm LA Vol (A2C):   70.7 ml 35.48 ml/m  RA Volume:   55.90 ml  28.05 ml/m LA Vol (A4C):   57.7 ml 28.96 ml/m LA Biplane Vol: 67.4 ml 33.82 ml/m  AORTIC VALVE                    PULMONIC VALVE AV Area (Vmax):    2.03 cm     PV Vmax:          0.79 m/s AV Area (Vmean):   1.54 cm     PV Peak grad:     2.5 mmHg AV Area (VTI):     2.01 cm     PR End Diast Vel: 8.12 msec AV Vmax:           90.60 cm/s AV Vmean:          68.900 cm/s AV VTI:            0.142 m AV Peak Grad:      3.3 mmHg AV Mean Grad:      2.0 mmHg LVOT Vmax:         72.40 cm/s LVOT Vmean:        41.800 cm/s LVOT VTI:          0.112 m LVOT/AV VTI ratio: 0.79  AORTA Ao Root diam: 3.00 cm Ao Asc diam:  2.50 cm MITRAL VALVE                 TRICUSPID VALVE MV Area (PHT): 5.20 cm      TR Peak grad:   24.0 mmHg MV Area VTI:   1.40 cm      TR Vmax:        245.00 cm/s MV Peak grad:  4.0 mmHg MV Mean grad:  2.0 mmHg      SHUNTS MV Vmax:       1.00 m/s      Systemic VTI:  0.11 m MV Vmean:      56.4 cm/s     Systemic Diam: 1.80 cm MV Decel Time: 146 msec MR Peak grad:   65.0 mmHg MR Mean grad:   42.5 mmHg MR Vmax:        403.00 cm/s MR Vmean:       307.5 cm/s MR PISA:        1.57 cm MR PISA Radius: 0.50 cm MV E velocity: 96.50 cm/s MV A velocity: 44.60 cm/s MV E/A ratio:  2.16 Zoila Shutter MD Electronically signed by Zoila Shutter MD Signature Date/Time: 06/14/2023/12:32:48 PM    Final    DG CHEST PORT 1 VIEW Result Date: 06/14/2023 CLINICAL DATA:  Encounter for central line placement EXAM: PORTABLE CHEST 1 VIEW COMPARISON:  Yesterday FINDINGS: Left IJ line with tip at the upper cavoatrial junction. No pneumothorax or new  mediastinal widening. There is stable cardiomegaly and bilateral hazy opacification of the chest. Layering pleural fluid is possible. Artifact from EKG leads. IMPRESSION: 1. New central line without complicating feature. 2. Stable aeration Electronically Signed   By: Tiburcio Pea M.D.   On: 06/14/2023 07:28   DG Foot 2 Views Right Result Date: 06/14/2023 CLINICAL DATA:  63 year old male with history of infection of the first and second toes of the right foot. EXAM: RIGHT FOOT - 2 VIEW COMPARISON:  No priors. FINDINGS: Two views of the right foot demonstrate lucent areas of osseous destruction involving the tuft of the distal phalanx of the great toe, as well as in the  second toe involving both the distal and mid phalanges centered around the D IP joint. Overlying soft tissues are irregular with probable soft tissue gas. No other definite osseous lesions are noted. No acute displaced fracture or dislocation. IMPRESSION: 1. 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 Electronically Signed   By: Trudie Reed M.D.   On: 06/14/2023 06:42   MR BRAIN WO CONTRAST Result Date: 06/14/2023 CLINICAL DATA:  Mental status change, unknown cause EXAM: MRI HEAD WITHOUT CONTRAST TECHNIQUE: Multiplanar, multiecho pulse sequences of the brain and surrounding structures were obtained without intravenous contrast. COMPARISON:  CT head from today. FINDINGS: Brain: Acute right cerebellar infarct. Many small acute infarcts throughout the left frontal, parietal and occipital lobes. More confluent/larger acute or early subacute infarct in the posterior right frontal lobe. No mass occupying acute hemorrhage or midline shift. Remote cerebellar infarcts. No hydrocephalus. Vascular: Major arterial flow voids are maintained at the skull base. Skull and upper cervical spine: Normal marrow signal.  Sinuses/Orbits: Clear sinuses.  No acute orbital findings. IMPRESSION: Acute or early subacute confluent infarct in the right frontal lobe. Additional smaller acute infarcts in the right cerebellum, left frontal, parietal and occipital lobes. Given involvement of multiple vascular territories, consider an embolic etiology. Electronically Signed   By: Feliberto Harts M.D.   On: 06/14/2023 02:17   DG Chest Portable 1 View Result Date: 06/13/2023 CLINICAL DATA:  Cough, altered mental status EXAM: PORTABLE CHEST 1 VIEW COMPARISON:  08/18/2022 FINDINGS: Stable cardiomegaly. Aortic atherosclerotic calcification. Diffuse interstitial coarsening greatest in the mid and lower lungs bilaterally. No focal pneumonia. No pleural effusion or pneumothorax. IMPRESSION: Cardiomegaly with interstitial coarsening which may be due to edema or atypical infection. Electronically Signed   By: Minerva Fester M.D.   On: 06/13/2023 21:10   CT HEAD WO CONTRAST ( ) Result Date: 06/13/2023 CLINICAL DATA:  Neuro deficit, acute, stroke suspected Dizziness, altered mental status EXAM: CT HEAD WITHOUT CONTRAST TECHNIQUE: Contiguous axial images were obtained from the base of the skull through the vertex without intravenous contrast. RADIATION DOSE REDUCTION: This exam was performed according to the departmental dose-optimization program which includes automated exposure control, adjustment of the mA and/or kV according to patient size and/or use of iterative reconstruction technique. COMPARISON:  None Available. FINDINGS: Brain: Probably acute or subacute infarcts in the posterior right frontal lobe. Possible slight petechial hemorrhage without mass occupying acute hemorrhage. No significant mass effect. Midline shift. No hydrocephalus. No visible mass lesion. Remote bilateral cerebellar infarcts. Vascular: No hyperdense vessel.  Calcific atherosclerosis. Skull: No acute fracture. Sinuses/Orbits: Clear sinuses.  No acute orbital  findings. Other: No mastoid effusions. IMPRESSION: 1. Probably acute or subacute infarcts in the posterior right frontal lobe. Recommend MRI. 2. Possible slight petechial hemorrhage without mass occupying acute hemorrhage. 3. No significant mass effect. Electronically Signed   By: Feliberto Harts M.D.   On: 06/13/2023 20:51    Cardiac Studies   Reviewed echo  Patient Profile     63 y.o. male   Assessment & Plan    Septic shock Right toe/foot gangrene Subacute CVA CHF  LV thrombus  CAD  Hypertension    Off Levophed, septic shock has resolved. No still on  broad-spectrum antibiotics per the ICU team for vancomycin and cefepime.  For his infection.   In terms of his cardiovascular conditions clinically he does not appear to be in heart  failure.  Known significantly depressed ejection fraction giving back unfortunately cannot start patient back on his guideline directed medical therapy.   Agree with heparin drip in the setting of his left ventricular thrombus.  Thankfully no anginal symptom.  Procedures are planned for tomorrow.  The patient does not have any unstable cardiac conditions.  Upon evaluation today, he can achieve 4 METs or greater without anginal symptoms.  According to [facility] and AHA guidelines, he requires no further cardiac workup prior to his noncardiac surgery and should be at acceptable risk. Our service is available as necessary in the perioperative period.        For questions or updates, please contact [facility] Please consult [email or web address] for contact info under Cardiology/STEMI.      Signed, Thomasene Ripple, DO  06/15/2023, 8:13 AM

## 2023-06-15 NOTE — Progress Notes (Signed)
eLink Physician-Brief Progress Note Patient Name: Derek Thompson DOB: 1960-08-21 MRN: [id number]   Date of Service  06/15/2023  HPI/Events of Note  63 year old with a history of acute kidney injury, heart failure, and right lower extremity developed septic shock due to osteomyelitis of the right lower extremity.  Now is having copious diarrhea, requesting Imodium.  eICU Interventions  Concern for C. difficile-will order C. difficile testing as he meets criteria.  Currently on vancomycin and cefepime.  If C. difficile testing is negative, can initiate  Imodium     Intervention Category Minor Interventions: Routine modifications to care plan (e.g. PRN medications for pain, fever)  Maydell Knoebel 06/15/2023, 5:03 AM

## 2023-06-15 NOTE — Progress Notes (Addendum)
PHARMACY - ANTICOAGULATION CONSULT NOTE  Pharmacy Consult for IV heparin Indication:  Apex thrombus  No Known Allergies  Patient Measurements: Height: 6' (182.9 cm) Weight: 77.5 kg (170 lb 13.7 oz) IBW/kg (Calculated) : 77.6 Heparin Dosing Weight: Actual body weight  Vital Signs: Temp: 98.4 F (36.9 C) (02/13 0751) Temp Source: Oral (02/13 0751) BP: 100/72 (02/13 1100) Pulse Rate: 150 (02/13 1115)  Labs: Recent Labs    06/13/23 1940 06/13/23 1958 06/13/23 1959 06/13/23 2228 06/14/23 0452 06/14/23 1255 06/14/23 1405 06/14/23 2004 06/15/23 0414 06/15/23 1018  HGB 8.9* 10.2* 10.2*  --   --   --   --   --  8.1*  --   HCT 27.9* 30.0* 30.0*  --   --   --   --   --  25.1*  --   PLT 195  --   --   --   --   --   --   --  127*  --   APTT  --   --   --   --  32  --   --   --   --   --   LABPROT  --   --   --   --  20.2*  --   --   --   --   --   INR  --   --   --   --  1.7*  --   --   --   --   --   HEPARINUNFRC  --   --   --   --   --   --   --  0.11*  --  <0.10*  CREATININE  --   --  1.90*   < > 1.84* 1.51*  --   --  1.25*  --   TROPONINIHS  --   --   --   --   --   --  66*  --   --   --    < > = values in this interval not displayed.    Estimated Creatinine Clearance: 66.3 mL/min (A) (by C-G formula based on SCr of 1.25 mg/dL (H)).   Medical History: Past Medical History:  Diagnosis Date   Anemia    Diabetes mellitus without complication (HCC) 1987   Family history of breast cancer    Hypertension    Myocardial infarction (HCC)    Prostate cancer (HCC)     Medications:  Scheduled:   aspirin EC  81 mg Oral Daily   atorvastatin  80 mg Oral Daily   Chlorhexidine Gluconate Cloth  6 each Topical Daily   digoxin  0.125 mg Oral Daily   heparin  2,000 Units Intravenous Once   insulin aspart  0-9 Units Subcutaneous Q4H   insulin glargine-yfgn  12 Units Subcutaneous Daily   metroNIDAZOLE  500 mg Oral Q12H   pantoprazole  40 mg Oral Daily   sodium chloride flush   10-40 mL Intracatheter Q12H   vancomycin variable dose per unstable renal function (pharmacist dosing)   Does not apply See admin instructions   Infusions:   ceFEPime (MAXIPIME) IV 2 g (06/15/23 1014)   heparin 1,400 Units/hr (06/15/23 1007)   norepinephrine (LEVOPHED) Adult infusion Stopped (06/14/23 1150)    Assessment: Derek Thompson is a 63 y.o M w/ PMHx of poorly controlled T2DM, HFrEF (LVEF 25%), LBBB, G2DD and LV global hypokinesis, HTN, CAD, Prostate Cancer treated w/ radiation 2022 who presented to ED for 1  week of progressive generalized weakness, 2 days AMS, admitted for suspected HHS. 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 Note patient also with acute/subacute infarct in R frontal lobe, with suspicion for embolic etiology.  Vascular intervention moved to tomorrow. AM HL <0.1, and was not interrupted. Will bolus + increase dose, with plans to hold 1 hour prior to vascular intervention tomorrow.  Goal of Therapy:  Heparin level 0.3-0.5 units/ml  (targeting low end for concominant CVA/ w/ petechial hemorrhage Monitor platelets by anticoagulation protocol: Yes   Plan:  2000 units heparin bolus Increase heparin to 1700 units / hr 8 hr heparin level Follow up procedure time, heparin off 1 hour prior  Lora Paula, PharmD PGY-2 Infectious Diseases Pharmacy Resident [facility] 06/15/2023 11:26 AM

## 2023-06-15 NOTE — Progress Notes (Addendum)
  Progress Note    06/15/2023 7:35 AM  Subjective:  no complaints   Vitals:   06/15/23 0500 06/15/23 0600  BP: 113/67 109/69  Pulse: 91 90  Resp: 19 20  Temp:    SpO2: 96% 95%   Physical Exam: Lungs:  non labored Extremities:  dressing left in place R foot; palpable R PT Neurologic: A&O  CBC    Component Value Date/Time   WBC 15.2 (H) 06/15/2023 0414   RBC 2.94 (L) 06/15/2023 0414   HGB 8.1 (L) 06/15/2023 0414   HCT 25.1 (L) 06/15/2023 0414   PLT 127 (L) 06/15/2023 0414   MCV 85.4 06/15/2023 0414   MCH 27.6 06/15/2023 0414   MCHC 32.3 06/15/2023 0414   RDW 17.0 (H) 06/15/2023 0414   LYMPHSABS 0.6 (L) 06/13/2023 1940   MONOABS 0.5 06/13/2023 1940   EOSABS 0.0 06/13/2023 1940   BASOSABS 0.0 06/13/2023 1940    BMET    Component Value Date/Time   NA 135 06/15/2023 0414   NA 135 11/22/2022 1445   K 3.4 (L) 06/15/2023 0414   CL 105 06/15/2023 0414   CO2 20 (L) 06/15/2023 0414   GLUCOSE 162 (H) 06/15/2023 0414   BUN 17 06/15/2023 0414   BUN 24 11/22/2022 1445   CREATININE 1.25 (H) 06/15/2023 0414   CALCIUM 7.7 (L) 06/15/2023 0414   GFRNONAA >60 06/15/2023 0414   GFRAA 89 06/10/2020 1645    INR    Component Value Date/Time   INR 1.7 (H) 06/14/2023 0452     Intake/Output Summary (Last 24 hours) at 06/15/2023 0735 Last data filed at 06/15/2023 0600 Gross per 24 hour  Intake 539.07 ml  Output 1050 ml  Net -510.93 ml     Assessment/Plan:  63 y.o. male with gangrene R foot  Continue current wound care.  Can paint toes with betadine.  Plan is for Aortogram with right leg runoff and possible intervention.  This will be scheduled for tomorrow 2/14.  Cr improved today.  Continue aspirin and statin.  Ok to have a diet today.   Derek Rutter, PA-C [facility] [phone] 06/15/2023 7:35 AM  VASCULAR STAFF ADDENDUM: I agree with the above.  Unfortunately due to scheduling conflicts, Derek Thompson has been moved to tomorrow. Please make  n.p.o. at midnight.  Derek Sparrow MD [facility] Phone Number: [phone] 06/15/2023 8:51 AM

## 2023-06-15 NOTE — Progress Notes (Signed)
PHARMACY - ANTICOAGULATION CONSULT NOTE  Pharmacy Consult for IV heparin Indication:  Apex thrombus  No Known Allergies  Patient Measurements: Height: 6' (182.9 cm) Weight: 77.5 kg (170 lb 13.7 oz) IBW/kg (Calculated) : 77.6 Heparin Dosing Weight: Actual body weight  Vital Signs: Temp: 98.4 F (36.9 C) (02/13 1522) Temp Source: Oral (02/13 1522) BP: 108/73 (02/13 1745) Pulse Rate: 93 (02/13 1745)  Labs: Recent Labs    06/13/23 1940 06/13/23 1958 06/13/23 1959 06/13/23 2228 06/14/23 0452 06/14/23 1255 06/14/23 1405 06/14/23 2004 06/15/23 0414 06/15/23 1018 06/15/23 1844  HGB 8.9* 10.2* 10.2*  --   --   --   --   --  8.1*  --   --   HCT 27.9* 30.0* 30.0*  --   --   --   --   --  25.1*  --   --   PLT 195  --   --   --   --   --   --   --  127*  --   --   APTT  --   --   --   --  32  --   --   --   --   --   --   LABPROT  --   --   --   --  20.2*  --   --   --   --   --   --   INR  --   --   --   --  1.7*  --   --   --   --   --   --   HEPARINUNFRC  --   --   --   --   --   --   --  0.11*  --  <0.10* 0.19*  CREATININE  --   --  1.90*   < > 1.84* 1.51*  --   --  1.25*  --   --   TROPONINIHS  --   --   --   --   --   --  66*  --   --   --   --    < > = values in this interval not displayed.    Estimated Creatinine Clearance: 66.3 mL/min (A) (by C-G formula based on SCr of 1.25 mg/dL (H)).   Medical History: Past Medical History:  Diagnosis Date   Anemia    Diabetes mellitus without complication (HCC) 1987   Family history of breast cancer    Hypertension    Myocardial infarction (HCC)    Prostate cancer (HCC)     Medications:  Scheduled:   aspirin EC  81 mg Oral Daily   atorvastatin  80 mg Oral Daily   Chlorhexidine Gluconate Cloth  6 each Topical Daily   digoxin  0.125 mg Oral Daily   insulin aspart  0-9 Units Subcutaneous Q4H   insulin glargine-yfgn  12 Units Subcutaneous Daily   metoprolol tartrate  12.5 mg Oral BID   metroNIDAZOLE  500 mg Oral Q12H    pantoprazole  40 mg Oral Daily   sodium chloride flush  10-40 mL Intracatheter Q12H   vancomycin variable dose per unstable renal function (pharmacist dosing)   Does not apply See admin instructions   Infusions:   ceFEPime (MAXIPIME) IV 2 g (06/15/23 1844)   heparin 1,700 Units/hr (06/15/23 1132)   vancomycin      Assessment: Derek Thompson is a 63 y.o M w/ PMHx of poorly controlled  T2DM, HFrEF (LVEF 25%), LBBB, G2DD and LV global hypokinesis, HTN, CAD, Prostate Cancer treated w/ radiation 2022 who presented to ED for 1 week of progressive generalized weakness, 2 days AMS, admitted for suspected HHS. 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 Note patient also with acute/subacute infarct in R frontal lobe, with suspicion for embolic etiology.  Vascular intervention listed for tomorrow. Heparin level this evening came back low at 0.19, on heparin infusion at 1700 units/hr. No s/sx of bleeding or infusion issues per RN.   Goal of Therapy:  Heparin level 0.3-0.5 units/ml  (targeting low end for concominant CVA/ w/ petechial hemorrhage Monitor platelets by anticoagulation protocol: Yes   Plan:  Increase heparin infusion to 1850 units/hr  8 hr heparin level Follow up procedure time, heparin off 1 hour prior  Thank you for allowing pharmacy to participate in this patient's care,  Sherron Monday, PharmD, BCCCP Clinical Pharmacist  Phone: [phone] 06/15/2023 7:38 PM  Please check AMION for all [facility] Pharmacy phone numbers After 10:00 PM, call Main Pharmacy [phone]

## 2023-06-15 NOTE — Progress Notes (Signed)
NAMEBrandy Thompson, MRN:  [id number], DOB:  08-26-1960, LOS: 2 ADMISSION DATE:  06/13/2023, CONSULTATION DATE:  06/14/2023 REFERRING MD:  Briscoe Deutscher , CHIEF COMPLAINT:  Shock    History of Present Illness:  Derek Thompson is a 62 y.o M w/ PMHx of poorly controlled T2DM, HFrEF (LVEF 25%), LBBB, G2DD and LV global hypokinesis, HTN, CAD, Prostate Cancer treated w/ radiation 2022 who presented to ED for 1 week of progressive generalized weakness, 2 days AMS, admitted for suspected HHS.    Initial labs showed CBG 548, beta-hydroxybutyrate acid 1.04. BNP >4,500, WBC 21.8 w/ Abs Neut 20.5, Hgb A1c 13.3, lactic acid 2.6, Cr 1.98, Serum osmolality 302. VBG: 7.48/27.9/21.1.    Rapid Response 2/12 called for symptomatic hypotension. Started to Levophed gtt with fluids. PCCM consulted.   Pertinent  Medical History  Poorly controlled DM-2 (HbA1c 13.3%), CAD, CHF (systolic and diastolic, EF 25%, G2DD), anemia of chronic illness illness, HTN   Significant Hospital Events: Including procedures, antibiotic start and stop dates in addition to other pertinent events   2/12: Given Rocephin, Zithromax, 2.6 L (NS 0.9% and LR), insulin drip, started on Levophed for hypotension, stopped later in the day.  2/12: R foot xray showed osteomyelitis great toe and 2nd toe, orthopedics consulted. Vancomycin started with addition of Cefepime and Flagyl  2/12: ECHO showed large LV apical thrombus, cardiology consulted. IV heparin.  2/12: ABIs abnormal, VSS consulted.  2/13: Sustained wide QRS tachycardia HR 150s. Given IV digoxin x 2, Po digoxin and po metoprolol. HR stable 90s. Restarted home medication Digoxin 0.125 mg daily   Interim History / Subjective:  Laying in bed, sleeping comfortably. Awakens easily. AO x 4. Denies any CP, SOB, Abdominal pain. Denies any current episodes of diarrhea. Denies any nausea/vomiting. Denies any new weakness.   Objective   Blood pressure 109/69, pulse 90, temperature 98.4 F (36.9  C), temperature source Oral, resp. rate 20, height 6' (1.829 m), weight 77.5 kg, SpO2 95%.        Intake/Output Summary (Last 24 hours) at 06/15/2023 0913 Last data filed at 06/15/2023 0600 Gross per 24 hour  Intake 495 ml  Output 1050 ml  Net -555 ml   Filed Weights   06/13/23 1851 06/14/23 0520 06/15/23 0500  Weight: 80.7 kg 77.5 kg 77.5 kg    Examination: General: Appears ill, Laying in bed, no acute distress, on room air.  HENT: Atraumatic, normocephalic.  Lungs: clear to auscultation bilaterally  Cardiovascular: normal rate, no murmur.  Abdomen: soft, NT, ND.  Extremities: 2+ pitting edema distal LLE and 1+ distal RLE. R distal foot is dressed, though malodorous scent is still present.  Neuro: Alert and oriented to self, DOB, month, location and event. No focal neuro deficits.  GU: not assessed.   Pertinent Imaging:  CTH: probably acute or subacute infarcts in the posterior right frontal lobe. Possible slight petechial hemorrhage without mass occupying acute Hemorrhage  MR brain: Acute or early subacute confluent infarct in the right frontal lobe. Additional smaller acute infarcts in the right cerebellum, left frontal, parietal and occipital lobes. Given involvement of multiple vascular territories, consider an embolic etiology.   CXR: Cardiomegaly with interstitial coarsening which may be due to edema or atypical infection.   R foot X ray showed osseous destruction involving the tuft of the distal phalanx of the great toe, as well as in the second toe involving both the distal and mid phalanges centered around the DIP joint. Overlying soft tissues are  irregular with probable soft tissue gas   ABIs showed Abnormal R and L toe-brachial index    ECHO 2/12: Large 3 x 3 cm round heterogenous mass at the LV apex which is suggestive of thrombus given severe LV dysfunction. EF 20-25%, global hypokinesis, grade II diastolic dysfunction, mildly reduced RV function, mildly elevated  pulmonary artery pressure, elevated LVEDP, moderate MR.     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 Lactic acid 2.6 >>2.3>>8.7. Remained hypotensive, did not respond to fluid resuscitation. Started on levophed 2/12, stopped same day as patient is able keep MAP > 60. R foot x ray concerning for probable osteomyelitis. ABIs showed Abnormal R and L toe-brachial index.  - Appreciate orthopedics and vascular surgery in their assistance with this patient - Plan for RLE angiogram by VSS tomorrow 2/14 - Plan for R great toe and 2nd toe amputation tomorrow 2/14 by orthopedics  - Continue IV Vancomycin with addition of IV cefepime and PO flagyl 500 BID  - Discontinued Levophed 2/12, able to maintain MAP >60   Acute to early Subacute CVAs  MR brain findings multiple infarcts concerning for embolic etiology. Neurology on board. Per exam, no acute focal deficits. AO x 4, no dysarthria. ECHO findings concerning for large 3 x 3 cm mass at the LV apex suggestive for thrombus. Cardiology consulted.  - Pharmacy dosed IV heparin  - If any acute neurological change, get STAT CT head  - Continue ASA and Statin - Will plan for CTA head and neck as Cr tolerates    - PT/OT/SLP  - NIHSS   CHF (systolic and diastolic), EF 20-25% LV apical thrombus 06/14/2023 Hx CAD HTN Wide-QRS tachycardia ECHO EF 20-25%, global hypokinesis, grade II diastolic dysfunction, dilated IVC with 3x3 cm mass at LV apex suggestive of thrombus. EKG 2/13 showed HR 156, wide QRS 140 ms and QTC 541 ms. LBBB. Per chart review, home medications consisted of digoxin 0.125 mg daily.   Given PO & IV digoxin 0.125 mg and PO metoprolol 12.5 mg. Still remained tachycardiac with HR 160s. Cardiology recommended another dose of  IV digoxin 0.125 mg, given. HR stable 80s-90s.   - Digoxin 0.125 mg daily - ASA and Statin - Hold home GDMT (spiro 25 mg daily, Entresto 97-103 mg BID, Toprol XL 25 mg daily) in the setting of hypotension    HHS Poorly controlled DM-2 (HbA1c 13.3%), not on insulin  AGMA  Lactic Acidosis Presented with CBG 548, beta-hydroxybutyrate acid 1.04, and  Serum osmolality 302. Initially started on endotool/IV insulin in the ED, transitioned to SSI and semglee 12 units daily.  - Will continue to follow up on daily BMETs - Glycemic control  - Semglee 12 units daily and SSI   AKI  Cr 1.25, baseline around 0.99. Creatinine is improving.  - S/p IV NS 10-20 mL/hr infusion  - continue to trend Cr   Normocytic Anemia  Thrombocytopenia Presented with Hgb 8.9, baseline ~10-11. Labs showed Iron 22, TIBC 161, saturation 14. Ferritin 264. Vit B12 908. IDA can be likely due to the current infectious/inflammatory process. Today, Hgb 8.1 and PLT 127  - Consider Iron repletion when sepsis resolves.   Elevated TSH and T4 TSH 5.6 and T4 1.38, does not have a history of thyroid disease. Likely elevated  in the setting of acute processes as above. - Recommend recheck when patient is overall stable in the outpatient setting   HypoMag Resolved. Mg 2.1 - Continue to monitor Mg    Hypophostamenia  Resolved, P 2.8  - continue to monitor   Hypokalemia  K 3.4, replete and recheck     Best Practice (right click and "Reselect all SmartList Selections" daily)   Diet/type: NPO DVT prophylaxis systemic heparin Pressure ulcer(s): Assessed by RN GI prophylaxis: PPI Lines: Central line Foley:   External urinary catheter  Code Status:  full code Last date of multidisciplinary goals of care discussion [--]  Labs   CBC: Recent Labs  Lab 06/13/23 1940 06/13/23 1958 06/13/23 1959 06/15/23 0414  WBC 21.8*  --   --  15.2*  NEUTROABS 20.5*  --   --   --   HGB 8.9* 10.2* 10.2* 8.1*  HCT 27.9* 30.0* 30.0*  25.1*  MCV 87.5  --   --  85.4  PLT 195  --   --  127*    Basic Metabolic Panel: Recent Labs  Lab 06/13/23 1939 06/13/23 1958 06/13/23 1959 06/13/23 2228 06/14/23 0452 06/14/23 1255 06/14/23 2004 06/15/23 0414  NA 126*   < > 127* 127* 130* 130*  --  135  K 4.1   < > 4.1 3.8 3.9 4.0  --  3.4*  CL 91*  --  95* 97* 99 102  --  105  CO2 19*  --   --  19* 13* 19*  --  20*  GLUCOSE 545*  --  564* 479* 195* 229*  --  162*  BUN 19  --  21 20 20 20   --  17  CREATININE 1.98*  --  1.90* 1.80* 1.84* 1.51*  --  1.25*  CALCIUM 8.5*  --   --  7.7* 8.0* 7.7*  --  7.7*  MG  --   --   --  1.5* 2.3  --   --  2.1  PHOS  --   --   --   --  1.6*  --  2.3* 2.8   < > = values in this interval not displayed.   GFR: Estimated Creatinine Clearance: 66.3 mL/min (A) (by C-G formula based on SCr of 1.25 mg/dL (H)). Recent Labs  Lab 06/13/23 1940 06/13/23 1959 06/13/23 2232 06/14/23 0452 06/15/23 0414  PROCALCITON  --   --   --  23.25  --   WBC 21.8*  --   --   --  15.2*  LATICACIDVEN  --  2.6* 2.3* 8.7*  --     Liver Function Tests: Recent Labs  Lab 06/13/23 1939 06/14/23 0452  AST 29 26  ALT 27 22  ALKPHOS 49 41  BILITOT 0.7 0.6  PROT 6.7 6.0*  ALBUMIN 2.1* 1.8*   Recent Labs  Lab 06/13/23 1939  LIPASE 18   Recent Labs  Lab 06/13/23 2031  AMMONIA 23    ABG    Component Value Date/Time   PHART 7.392 10/14/2022 0804   PCO2ART 33.8 10/14/2022 0804   PO2ART 96 10/14/2022 0804   HCO3 21.1 06/13/2023 1958   TCO2 20 (L) 06/13/2023 1959   ACIDBASEDEF 2.0 06/13/2023 1958   O2SAT 66.2 06/14/2023 1250     Coagulation Profile: Recent Labs  Lab 06/14/23 0452  INR 1.7*    Cardiac Enzymes: No results for input(s): "CKTOTAL", "CKMB", "CKMBINDEX", "TROPONINI" in the last 168 hours.  HbA1C: Hemoglobin A1C  Date/Time Value Ref  Range Status  11/22/2022 01:32 PM 11.4 (A) 4.0 - 5.6 % Final   Hgb A1c MFr Bld  Date/Time Value Ref Range Status  06/13/2023 07:40 PM 13.3 (H) 4.8  - 5.6 % Final    Comment:    (NOTE) Pre diabetes:          5.7%-6.4%  Diabetes:              >6.4%  Glycemic control for   <7.0% adults with diabetes   08/18/2022 06:09 PM 13.6 (H) 4.8 - 5.6 % Final    Comment:    (NOTE) Pre diabetes:          5.7%-6.4%  Diabetes:              >6.4%  Glycemic control for   <7.0% adults with diabetes     CBG: Recent Labs  Lab 06/14/23 1520 06/14/23 1950 06/14/23 2346 06/15/23 0349 06/15/23 0753  GLUCAP 204* 203* 170* 169* 143*    Review of Systems:     Past Medical History:  He,  has a past medical history of Anemia, Diabetes mellitus without complication (HCC) (1987), Family history of breast cancer, Hypertension, Myocardial infarction (HCC), and Prostate cancer (HCC).   Surgical History:   Past Surgical History:  Procedure Laterality Date   DENTAL SURGERY     RADIOACTIVE SEED IMPLANT N/A 02/04/2021   Procedure: RADIOACTIVE SEED IMPLANT/BRACHYTHERAPY IMPLANT;  Surgeon: Jannifer Hick, MD;  Location: [facility];  Service: Urology;  Laterality: N/A;   RIGHT/LEFT HEART CATH AND CORONARY ANGIOGRAPHY N/A 05/25/2020   Procedure: RIGHT/LEFT HEART CATH AND CORONARY ANGIOGRAPHY;  Surgeon: [place], Peter M, MD;  Location: [facility] INVASIVE CV LAB;  Service: Cardiovascular;  Laterality: N/A;   RIGHT/LEFT HEART CATH AND CORONARY ANGIOGRAPHY N/A 10/14/2022   Procedure: RIGHT/LEFT HEART CATH AND CORONARY ANGIOGRAPHY;  Surgeon: Dolores Patty, MD;  Location: [facility] INVASIVE CV LAB;  Service: Cardiovascular;  Laterality: N/A;   SPACE OAR INSTILLATION N/A 02/04/2021   Procedure: SPACE OAR INSTILLATION;  Surgeon: Jannifer Hick, MD;  Location: [facility];  Service: Urology;  Laterality: N/A;     Social History:   reports that he quit smoking about 6 years ago. His smoking use included cigarettes. He started smoking about 49 years ago. He has a 43 pack-year smoking history. He has never used smokeless tobacco. He reports that he does not currently use alcohol. He  reports that he does not currently use drugs.   Family History:  His family history includes Breast cancer in his mother; Cancer in his maternal grandfather.   Allergies No Known Allergies   Home Medications  Prior to Admission medications   Medication Sig Start Date End Date Taking? Authorizing Provider  Accu-Chek Softclix Lancets lancets SMARTSIG:Topical 1-4 Times Daily 08/20/22  Yes Atway, Rayann N, DO  aspirin EC 81 MG tablet Take 1 tablet (81 mg total) by mouth daily. Swallow whole. 09/07/22 09/07/23 Yes Simmons, Brittainy M, PA-C  blood glucose meter kit and supplies KIT Dispense based on patient and insurance preference. Use up to four times daily as directed. (FOR ICD-9 250.00, 250.01). 08/20/22  Yes Atway, Rayann N, DO  digoxin (LANOXIN) 0.125 MG tablet Take 1 tablet (0.125 mg total) by mouth daily. 09/07/22  Yes Robbie Lis M, PA-C  ezetimibe (ZETIA) 10 MG tablet Take 1 tablet (10 mg total) by mouth daily. 11/25/22  Yes Rana Snare, DO  furosemide (LASIX) 20 MG tablet Take 1 tablet (20 mg total) by mouth daily. 10/05/22  10/05/23 Yes Bensimhon, Bevelyn Buckles, MD  glucose blood (CONTOUR NEXT TEST) test strip Use to check blood sugar up to 3 times daily as instructed 11/22/22  Yes Rana Snare, DO  ibuprofen (ADVIL) 200 MG tablet Take 400 mg by mouth every 6 (six) hours as needed for headache or mild pain (pain score 1-3).   Yes [provider]  metoprolol succinate (TOPROL-XL) 25 MG 24 hr tablet TAKE 1 TABLET BY MOUTH DAILY 09/12/22  Yes Lyndle Herrlich, MD  spironolactone (ALDACTONE) 25 MG tablet Take 1 tablet (25 mg total) by mouth daily. 10/05/22  Yes Bensimhon, Bevelyn Buckles, MD  atorvastatin (LIPITOR) 40 MG tablet TAKE 1 TABLET BY MOUTH EVERY DAY Patient not taking: Reported on 06/13/2023 09/12/22   Lyndle Herrlich, MD  empagliflozin (JARDIANCE) 10 MG TABS tablet Take 1 tablet (10 mg total) by mouth daily before breakfast. Patient not taking: Reported on 06/13/2023 11/22/22    Rana Snare, DO  ferrous sulfate 325 (65 FE) MG tablet TAKE 1 TABLET BY MOUTH EVERY DAY Patient not taking: Reported on 06/13/2023 09/20/22   Lyndle Herrlich, MD  gabapentin (NEURONTIN) 300 MG capsule TAKE 1 CAPSULE BY MOUTH TWICE A DAY Patient not taking: Reported on 06/13/2023 09/29/22   Lyndle Herrlich, MD  insulin glargine (LANTUS SOLOSTAR) 100 UNIT/ML Solostar Pen INJECT 16 UNITS INTO THE SKIN AT BEDTIME. Patient not taking: Reported on 06/13/2023 10/10/22   Lyndle Herrlich, MD  metFORMIN (GLUCOPHAGE) 1000 MG tablet TAKE 1 TABLET (1,000 MG TOTAL) BY MOUTH TWICE A DAY WITH FOOD Patient not taking: Reported on 06/13/2023 10/28/22   Bensimhon, Bevelyn Buckles, MD  Multiple Vitamin (MULTIVITAMIN) tablet Take 1 tablet by mouth daily. Patient not taking: Reported on 06/13/2023    [provider]  sacubitril-valsartan (ENTRESTO) 97-103 MG Take 1 tablet by mouth 2 (two) times daily. Patient not taking: Reported on 06/13/2023 09/20/22   Bensimhon, Bevelyn Buckles, MD  tamsulosin (FLOMAX) 0.4 MG CAPS capsule TAKE 1 CAPSULE BY MOUTH EVERYDAY AT BEDTIME Patient not taking: Reported on 06/13/2023 09/12/22   Lyndle Herrlich, MD     Critical care time:

## 2023-06-15 NOTE — Evaluation (Signed)
Speech Language Pathology Evaluation Patient Details Name: Derek Thompson MRN: [id number] DOB: [birth year] Today's Date: 06/15/2023 Time: 8416-6063 SLP Time Calculation (min) (ACUTE ONLY): 18 min  Problem List:  Patient Active Problem List   Diagnosis Date Noted   Chronic osteomyelitis of toe, right (HCC) 06/15/2023   Cutaneous abscess of right foot 06/15/2023   Generalized weakness 06/14/2023   Acute metabolic encephalopathy 06/14/2023   SIRS (systemic inflammatory response syndrome) (HCC) 06/14/2023   High anion gap metabolic acidosis 06/14/2023   Lactic acidosis 06/14/2023   AKI (acute kidney injury) (HCC) 06/14/2023   Acute on chronic anemia 06/14/2023   Hypomagnesemia 06/14/2023   Hyperglycemia 06/14/2023   Septic shock (HCC) 06/14/2023   Depressed left ventricular ejection fraction 06/14/2023   Hyperosmolar hyperglycemic state (HHS) (HCC) 06/13/2023   Hyperkalemia 11/22/2022   Iron deficiency anemia 08/27/2022   MDD (major depressive disorder) 08/27/2022   Healthcare maintenance 08/27/2022   Genetic testing 10/12/2020   Malignant neoplasm of prostate (HCC)    Family history of breast cancer    CAD (coronary artery disease) 06/10/2020   Hypertension 06/10/2020   Dyslipidemia 06/10/2020   DM2 (diabetes mellitus, type 2) (HCC) 06/10/2020   Chronic combined systolic and diastolic heart failure (HCC)    Pulmonary edema cardiac cause (HCC) 05/22/2020   Past Medical History:  Past Medical History:  Diagnosis Date   Anemia    Diabetes mellitus without complication (HCC) 1987   Family history of breast cancer    Hypertension    Myocardial infarction (HCC)    Prostate cancer (HCC)    Past Surgical History:  Past Surgical History:  Procedure Laterality Date   DENTAL SURGERY     RADIOACTIVE SEED IMPLANT N/A 02/04/2021   Procedure: RADIOACTIVE SEED IMPLANT/BRACHYTHERAPY IMPLANT;  Surgeon: Jannifer Hick, MD;  Location: [facility];  Service: Urology;  Laterality: N/A;    RIGHT/LEFT HEART CATH AND CORONARY ANGIOGRAPHY N/A 05/25/2020   Procedure: RIGHT/LEFT HEART CATH AND CORONARY ANGIOGRAPHY;  Surgeon: [place], Peter M, MD;  Location: [facility] INVASIVE CV LAB;  Service: Cardiovascular;  Laterality: N/A;   RIGHT/LEFT HEART CATH AND CORONARY ANGIOGRAPHY N/A 10/14/2022   Procedure: RIGHT/LEFT HEART CATH AND CORONARY ANGIOGRAPHY;  Surgeon: Dolores Patty, MD;  Location: [facility] INVASIVE CV LAB;  Service: Cardiovascular;  Laterality: N/A;   SPACE OAR INSTILLATION N/A 02/04/2021   Procedure: SPACE OAR INSTILLATION;  Surgeon: Jannifer Hick, MD;  Location: [facility];  Service: Urology;  Laterality: N/A;   HPI:  Pt is a 63 y/o M presenting to [facility] on 2/11 with 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 PMH: poorly controlled DM2, chronic diastolic CHF, chronic R sided heart failure, anemia of chronic disease, L bundle branch block. MRI: Acute or early subacute confluent infarct in the right frontal lobe.  Additional smaller acute infarcts in the right cerebellum, left  frontal, parietal and occipital lobes. Given involvement of multiple  vascular territories, consider an embolic etiology.   Assessment / Plan / Recommendation Clinical Impression  Patient presents with mild cognitive deficits characterized by deficits in the areas of short term memory and pragmatics. Affect flat and processing time appearing mildly delayed however unable to determine new vs baseline status. Patient reports he feels "slow" in general. Given deficits above, patient will benefit from SLP f/u with focus on functional cognitive-communication abilities for return to independent living.    SLP Assessment  SLP Recommendation/Assessment: Patient needs continued Speech Lanaguage Pathology Services SLP Visit  Diagnosis: Cognitive communication deficit (R41.841)    Recommendations for follow up therapy are one component of a multi-disciplinary discharge  planning process, led by the attending physician.  Recommendations may be updated based on patient status, additional functional criteria and insurance authorization.    Follow Up Recommendations   (TBD)    Assistance Recommended at Discharge  Intermittent Supervision/Assistance  Functional Status Assessment Patient has had a recent decline in their functional status and demonstrates the ability to make significant improvements in function in a reasonable and predictable amount of time.  Frequency and Duration min 2x/week  2 weeks      SLP Evaluation Cognition  Overall Cognitive Status: No family/caregiver present to determine baseline cognitive functioning Arousal/Alertness: Awake/alert Orientation Level: Oriented X4 Memory: Impaired Memory Impairment: Retrieval deficit Awareness:  (TBD) Safety/Judgment: Appears intact       Comprehension  Auditory Comprehension Overall Auditory Comprehension: Appears within functional limits for tasks assessed Visual Recognition/Discrimination Discrimination: Within Function Limits Reading Comprehension Reading Status: Within funtional limits    Expression Expression Primary Mode of Expression: Verbal Verbal Expression Overall Verbal Expression: Appears within functional limits for tasks assessed   Oral / Motor  Oral Motor/Sensory Function Overall Oral Motor/Sensory Function: Within functional limits Motor Speech Overall Motor Speech: Other (comment) (low vocal intensity, appearing to mumble without deficit dysarthria)           Ferdinand Lango MA, CCC-SLP  Derek Thompson 06/15/2023, 11:35 AM

## 2023-06-15 NOTE — Progress Notes (Signed)
eLink Physician-Brief Progress Note Patient Name: Derek Thompson DOB: 16-Jul-1960 MRN: [id number]   Date of Service  06/15/2023  HPI/Events of Note  63 year old with a history of acute kidney injury, heart failure, and right lower extremity developed septic shock due to osteomyelitis of the right lower extremity.   Had a bout of SVT earlier today, started on metoprolol and digoxin with improvement.  Patient received 12.5 mg of scheduled metoprolol tartrate but remains tachycardic 120s.  eICU Interventions  Increase dose of metoprolol to tartrate to 25 mg.  Additional 12.5 mg dose now.     Intervention Category Minor Interventions: Routine modifications to care plan (e.g. PRN medications for pain, fever)  Derek Thompson 06/15/2023, 11:56 PM

## 2023-06-15 NOTE — Consult Note (Signed)
ORTHOPAEDIC CONSULTATION  REQUESTING PHYSICIAN: Martina Sinner, MD  Chief Complaint: Necrotic ulceration second toe right foot.  HPI: Derek Thompson is a 63 y.o. male who presents with peripheral vascular disease with wet gangrene of the right second toe with chronic ischemic changes to the great toe.  Patient has a history of uncontrolled diabetes, hypertension and peripheral vascular disease with cardiac disease.  Past Medical History:  Diagnosis Date   Anemia    Diabetes mellitus without complication (HCC) 1987   Family history of breast cancer    Hypertension    Myocardial infarction [facility])    Prostate cancer [facility])    Past Surgical History:  Procedure Laterality Date   DENTAL SURGERY     RADIOACTIVE SEED IMPLANT N/A 02/04/2021   Procedure: RADIOACTIVE SEED IMPLANT/BRACHYTHERAPY IMPLANT;  Surgeon: Jannifer Hick, MD;  Location: [facility] ORS;  Service: Urology;  Laterality: N/A;   RIGHT/LEFT HEART CATH AND CORONARY ANGIOGRAPHY N/A 05/25/2020   Procedure: RIGHT/LEFT HEART CATH AND CORONARY ANGIOGRAPHY;  Surgeon: [place]az[place], Peter M, MD;  Location: [facility] INVASIVE CV LAB;  Service: Cardiovascular;  Laterality: N/A;   RIGHT/LEFT HEART CATH AND CORONARY ANGIOGRAPHY N/A 10/14/2022   Procedure: RIGHT/LEFT HEART CATH AND CORONARY ANGIOGRAPHY;  Surgeon: Dolores Patty, MD;  Location: [facility] INVASIVE CV LAB;  Service: Cardiovascular;  Laterality: N/A;   SPACE OAR INSTILLATION N/A 02/04/2021   Procedure: SPACE OAR INSTILLATION;  Surgeon: Jannifer Hick, MD;  Location: [facility];  Service: Urology;  Laterality: N/A;   Social History   Socioeconomic History   Marital status: Divorced    Spouse name: Not on file   Number of children: 3   Years of education: Not on file   Highest education level: Associate degree: academic program  Occupational History   Occupation: [employer]  Tobacco Use   Smoking status: Former    Current packs/day: 0.00    Average packs/day: 1 pack/day for 43.0 years (43.0 ttl  pk-yrs)    Types: Cigarettes    Start date: 05/22/1974    Quit date: 05/22/2017    Years since quitting: 6.0   Smokeless tobacco: Never   Tobacco comments:    Quit 2019 about 1PPD  Vaping Use   Vaping status: Never Used  Substance and Sexual Activity   Alcohol use: Not Currently    Comment: last drink 2015   Drug use: Not Currently    Comment: none in 7 years   Sexual activity: Not Currently  Other Topics Concern   Not on file  Social History Narrative   3 sons   Social Drivers of Corporate investment banker Strain: Low Risk  (08/19/2022)   Overall Financial Resource Strain (CARDIA)    Difficulty of Paying Living Expenses: Not very hard  Food Insecurity: No Food Insecurity (06/14/2023)   Hunger Vital Sign    Worried About Running Out of Food in the Last Year: Never true    Ran Out of Food in the Last Year: Never true  Transportation Needs: No Transportation Needs (06/14/2023)   PRAPARE - Administrator, Civil Service (Medical): No    Lack of Transportation (Non-Medical): No  Physical Activity: Not on file  Stress: Not on file  Social Connections: Not on file   Family History  Problem Relation Age of Onset   Breast cancer Mother        dx 30s/40s, twice   Cancer Maternal Grandfather        unknown type, d. >50   -  negative except otherwise stated in the family history section No Known Allergies Prior to Admission medications   Medication Sig Start Date End Date Taking? Authorizing Provider  Accu-Chek Softclix Lancets lancets SMARTSIG:Topical 1-4 Times Daily 08/20/22  Yes Atway, Rayann N, DO  aspirin EC 81 MG tablet Take 1 tablet (81 mg total) by mouth daily. Swallow whole. 09/07/22 09/07/23 Yes Simmons, Brittainy M, PA-C  blood glucose meter kit and supplies KIT Dispense based on patient and insurance preference. Use up to four times daily as directed. (FOR ICD-9 250.00, 250.01). 08/20/22  Yes Atway, Rayann N, DO  digoxin (LANOXIN) 0.125 MG tablet Take 1 tablet  (0.125 mg total) by mouth daily. 09/07/22  Yes Robbie Lis M, PA-C  ezetimibe (ZETIA) 10 MG tablet Take 1 tablet (10 mg total) by mouth daily. 11/25/22  Yes Rana Snare, DO  furosemide (LASIX) 20 MG tablet Take 1 tablet (20 mg total) by mouth daily. 10/05/22 10/05/23 Yes Bensimhon, Bevelyn Buckles, MD  glucose blood (CONTOUR NEXT TEST) test strip Use to check blood sugar up to 3 times daily as instructed 11/22/22  Yes Rana Snare, DO  ibuprofen (ADVIL) 200 MG tablet Take 400 mg by mouth every 6 (six) hours as needed for headache or mild pain (pain score 1-3).   Yes [provider]  metoprolol succinate (TOPROL-XL) 25 MG 24 hr tablet TAKE 1 TABLET BY MOUTH DAILY 09/12/22  Yes Lyndle Herrlich, MD  spironolactone (ALDACTONE) 25 MG tablet Take 1 tablet (25 mg total) by mouth daily. 10/05/22  Yes Bensimhon, Bevelyn Buckles, MD  atorvastatin (LIPITOR) 40 MG tablet TAKE 1 TABLET BY MOUTH EVERY DAY Patient not taking: Reported on 06/13/2023 09/12/22   Lyndle Herrlich, MD  empagliflozin (JARDIANCE) 10 MG TABS tablet Take 1 tablet (10 mg total) by mouth daily before breakfast. Patient not taking: Reported on 06/13/2023 11/22/22   Rana Snare, DO  ferrous sulfate 325 (65 FE) MG tablet TAKE 1 TABLET BY MOUTH EVERY DAY Patient not taking: Reported on 06/13/2023 09/20/22   Lyndle Herrlich, MD  gabapentin (NEURONTIN) 300 MG capsule TAKE 1 CAPSULE BY MOUTH TWICE A DAY Patient not taking: Reported on 06/13/2023 09/29/22   Lyndle Herrlich, MD  insulin glargine (LANTUS SOLOSTAR) 100 UNIT/ML Solostar Pen INJECT 16 UNITS INTO THE SKIN AT BEDTIME. Patient not taking: Reported on 06/13/2023 10/10/22   Lyndle Herrlich, MD  metFORMIN (GLUCOPHAGE) 1000 MG tablet TAKE 1 TABLET (1,000 MG TOTAL) BY MOUTH TWICE A DAY WITH FOOD Patient not taking: Reported on 06/13/2023 10/28/22   Bensimhon, Bevelyn Buckles, MD  Multiple Vitamin (MULTIVITAMIN) tablet Take 1 tablet by mouth daily. Patient not taking: Reported on  06/13/2023    [provider]  sacubitril-valsartan (ENTRESTO) 97-103 MG Take 1 tablet by mouth 2 (two) times daily. Patient not taking: Reported on 06/13/2023 09/20/22   Bensimhon, Bevelyn Buckles, MD  tamsulosin (FLOMAX) 0.4 MG CAPS capsule TAKE 1 CAPSULE BY MOUTH EVERYDAY AT BEDTIME Patient not taking: Reported on 06/13/2023 09/12/22   Lyndle Herrlich, MD   VAS [place] ABI WITH/WO TBI Result Date: 06/14/2023  LOWER EXTREMITY DOPPLER STUDY Patient Name:  SLATE DEBROUX  Date of Exam:   06/14/2023 Medical Rec #: [id number]      Accession #:    [id number] Date of Birth: 1961/01/03      Patient Gender: M Patient Age:   74 years Exam Location:  [facility] Procedure:      VAS [place] ABI WITH/WO TBI Referring Phys: MICHAEL JEFFERY --------------------------------------------------------------------------------  Indications: Ulceration, and  gangrene. High Risk Factors: Diabetes, past history of smoking.  Comparison Study: No prior exam. Performing Technologist: Fernande Bras  Examination Guidelines: A complete evaluation includes at minimum, Doppler waveform signals and systolic blood pressure reading at the level of bilateral brachial, anterior tibial, and posterior tibial arteries, when vessel segments are accessible. Bilateral testing is considered an integral part of a complete examination. Photoelectric Plethysmograph (PPG) waveforms and toe systolic pressure readings are included as required and additional duplex testing as needed. Limited examinations for reoccurring indications may be performed as noted.  ABI Findings: +---------+------------------+-----+----------+--------+ Right    Rt Pressure (mmHg)IndexWaveform  Comment  +---------+------------------+-----+----------+--------+ Brachial 110                    triphasic          +---------+------------------+-----+----------+--------+ PTA      111               1.01 monophasic          +---------+------------------+-----+----------+--------+ DP       118               1.07 biphasic           +---------+------------------+-----+----------+--------+ Great Toe203               1.85 Abnormal           +---------+------------------+-----+----------+--------+ +---------+------------------+-----+---------+-------+ Left     Lt Pressure (mmHg)IndexWaveform Comment +---------+------------------+-----+---------+-------+ Brachial 110                    triphasic        +---------+------------------+-----+---------+-------+ PTA      116               1.05 triphasic        +---------+------------------+-----+---------+-------+ DP       109               0.99 biphasic         +---------+------------------+-----+---------+-------+ Great Toe71                0.65 Abnormal         +---------+------------------+-----+---------+-------+ +-------+-----------+-----------+------------+------------+ ABI/TBIToday's ABIToday's TBIPrevious ABIPrevious TBI +-------+-----------+-----------+------------+------------+ Right  1.07       1.85                                +-------+-----------+-----------+------------+------------+ Left   1.05       0.65                                +-------+-----------+-----------+------------+------------+  Summary: Right: Resting right ankle-brachial index is within normal range. The right toe-brachial index is abnormal. Left: Resting left ankle-brachial index is within normal range. The left toe-brachial index is abnormal. *See table(s) above for measurements and observations.  Electronically signed by Gerarda Fraction on 06/14/2023 at 4:06:40 PM.    Final    US RENAL Result Date: 06/14/2023 CLINICAL DATA:  [id number] Encephalopathy acute [id number] EXAM: RENAL / URINARY TRACT ULTRASOUND COMPLETE COMPARISON:  09/10/2020 FINDINGS: Right Kidney: Renal measurements: 11.8 x 4.9 x 4.8 cm = volume: 143 mL. Echogenicity within normal limits. No  mass or hydronephrosis visualized. Left Kidney: Renal measurements: 10.6 x 4.4 x 4.3 cm = volume: 104 mL. Echogenicity within normal limits. No mass or hydronephrosis visualized. Bladder: Abnormally thickened appearance of the urinary bladder wall. Linear  area of increased echogenicity anteriorly within the bladder with dirty posterior acoustic shadowing. Other: Small bilateral pleural effusions. IMPRESSION: 1. Abnormally thickened appearance of the urinary bladder wall. Linear area of increased echogenicity anteriorly within the bladder with dirty posterior acoustic shadowing. This may represent air within the bladder lumen, which could be secondary to recent instrumentation or infection. Air within the bladder wall (emphysematous cystitis) is not excluded. Correlation with urinalysis is recommended. 2. Unremarkable sonographic appearance of the kidneys. 3. Small bilateral pleural effusions. Electronically Signed   By: Duanne Guess D.O.   On: 06/14/2023 13:40   ECHOCARDIOGRAM COMPLETE Result Date: 06/14/2023    ECHOCARDIOGRAM REPORT   Patient Name:   Zymere Patlan Date of Exam: 06/14/2023 Medical Rec #:  [id number]     Height:       72.0 in Accession #:    [id number]    Weight:       170.9 lb Date of Birth:  17-Jun-1960     BSA:          1.993 m Patient Age:    62 years      BP:           100/66 mmHg Patient Gender: M             HR:           89 bpm. Exam Location:  Inpatient Procedure: 2D Echo, Cardiac Doppler, Color Doppler and Intracardiac            Opacification Agent (Both Spectral and Color Flow Doppler were            utilized during procedure). Indications:    Stroke, shock  History:        Patient has prior history of Echocardiogram examinations, most                 recent 08/19/2022. CHF, CAD; Risk Factors:Hypertension, Diabetes,                 Dyslipidemia and Former Smoker.  Sonographer:    Vern Claude Referring Phys: Lavone Neri OPYD IMPRESSIONS  1. Large 3 x 3 cm round heterogenous mass at the  LV apex which is suggestive of thrombus given severe LV dysfunction and less likely myxoma - the mass did not take up Definity contrast (appears avascular). Left ventricular ejection fraction, by estimation, is 20 to 25%. Left ventricular ejection fraction by 2D MOD biplane is 21.2 %. The left ventricle has severely decreased function. The left ventricle demonstrates global hypokinesis. The left ventricular internal cavity size was mildly dilated. Left ventricular diastolic parameters are consistent with Grade II diastolic dysfunction (pseudonormalization). Elevated left ventricular end-diastolic pressure.  2. Right ventricular systolic function is mildly reduced. The right ventricular size is normal. There is mildly elevated pulmonary artery systolic pressure. The estimated right ventricular systolic pressure is 39.0 mmHg.  3. The mitral valve is abnormal. Moderate mitral valve regurgitation.  4. The aortic valve is tricuspid. Aortic valve regurgitation is not visualized.  5. The inferior vena cava is dilated in size with <50% respiratory variability, suggesting right atrial pressure of 15 mmHg. Comparison(s): Changes from prior study are noted. 08/16/2022: LVEF 25%, global hypokinesis. Conclusion(s)/Recommendation(s): Critical findings reported to Dr. Francine Graven and Elmer Picker, NP and acknowledged at 12:23 pm on 06/14/23. FINDINGS  Left Ventricle: Large 3 x 3 cm round heterogenous mass at the LV apex which is suggestive of thrombus given severe LV dysfunction and less likely myxoma - the mass did not  take up Definity contrast (appears avascular). Left ventricular ejection fraction, by estimation, is 20 to 25%. Left ventricular ejection fraction by 2D MOD biplane is 21.2 %. The left ventricle has severely decreased function. The left ventricle demonstrates global hypokinesis. Definity contrast agent was given IV to delineate the left ventricular endocardial borders. Strain imaging was not performed. The left  ventricular internal cavity size was mildly dilated. There is no left ventricular hypertrophy. Left ventricular diastolic parameters are consistent with Grade II diastolic dysfunction (pseudonormalization). Elevated left ventricular end-diastolic pressure. Right Ventricle: The right ventricular size is normal. No increase in right ventricular wall thickness. Right ventricular systolic function is mildly reduced. There is mildly elevated pulmonary artery systolic pressure. The tricuspid regurgitant velocity  is 2.45 m/s, and with an assumed right atrial pressure of 15 mmHg, the estimated right ventricular systolic pressure is 39.0 mmHg. Left Atrium: Left atrial size was normal in size. Right Atrium: Right atrial size was normal in size. Pericardium: There is no evidence of pericardial effusion. Mitral Valve: The mitral valve is abnormal. There is mild thickening of the anterior and posterior mitral valve leaflet(s). Moderate mitral valve regurgitation. MV peak gradient, 4.0 mmHg. The mean mitral valve gradient is 2.0 mmHg. Tricuspid Valve: The tricuspid valve is grossly normal. Tricuspid valve regurgitation is mild. Aortic Valve: The aortic valve is tricuspid. Aortic valve regurgitation is not visualized. Aortic valve mean gradient measures 2.0 mmHg. Aortic valve peak gradient measures 3.3 mmHg. Aortic valve area, by VTI measures 2.01 cm. Pulmonic Valve: The pulmonic valve was grossly normal. Pulmonic valve regurgitation is trivial. Aorta: The aortic root and ascending aorta are structurally normal, with no evidence of dilitation. Venous: The inferior vena cava is dilated in size with less than 50% respiratory variability, suggesting right atrial pressure of 15 mmHg. IAS/Shunts: No atrial level shunt detected by color flow Doppler. Additional Comments: 3D imaging was not performed.  LEFT VENTRICLE PLAX 2D                        Biplane EF (MOD) LVIDd:         5.50 cm         LV Biplane EF:   Left LVIDs:         4.80  cm                          ventricular LV PW:         0.70 cm                          ejection LV IVS:        0.60 cm                          fraction by LVOT diam:     1.80 cm                          2D MOD LV SV:         29                               biplane is LV SV Index:   14  21.2 %. LVOT Area:     2.54 cm                                Diastology                                LV e' medial:    5.98 cm/s LV Volumes (MOD)               LV E/e' medial:  16.1 LV vol d, MOD    226.1 ml      LV e' lateral:   9.03 cm/s A2C:                           LV E/e' lateral: 10.7 LV vol d, MOD    185.9 ml A4C: LV vol s, MOD    152.1 ml A2C: LV vol s, MOD    167.2 ml A4C: LV SV MOD A2C:   74.0 ml LV SV MOD A4C:   185.9 ml LV SV MOD BP:    47.0 ml RIGHT VENTRICLE            IVC RV Basal diam:  4.90 cm    IVC diam: 2.50 cm RV Mid diam:    3.50 cm RV S prime:     9.14 cm/s LEFT ATRIUM             Index        RIGHT ATRIUM           Index LA diam:        3.90 cm 1.96 cm/m   RA Area:     18.60 cm LA Vol (A2C):   70.7 ml 35.48 ml/m  RA Volume:   55.90 ml  28.05 ml/m LA Vol (A4C):   57.7 ml 28.96 ml/m LA Biplane Vol: 67.4 ml 33.82 ml/m  AORTIC VALVE                    PULMONIC VALVE AV Area (Vmax):    2.03 cm     PV Vmax:          0.79 m/s AV Area (Vmean):   1.54 cm     PV Peak grad:     2.5 mmHg AV Area (VTI):     2.01 cm     PR End Diast Vel: 8.12 msec AV Vmax:           90.60 cm/s AV Vmean:          68.900 cm/s AV VTI:            0.142 m AV Peak Grad:      3.3 mmHg AV Mean Grad:      2.0 mmHg LVOT Vmax:         72.40 cm/s LVOT Vmean:        41.800 cm/s LVOT VTI:          0.112 m LVOT/AV VTI ratio: 0.79  AORTA Ao Root diam: 3.00 cm Ao Asc diam:  2.50 cm MITRAL VALVE                 TRICUSPID VALVE MV Area (PHT): 5.20 cm      TR Peak grad:   24.0 mmHg MV Area VTI:   1.40 cm      TR  Vmax:        245.00 cm/s MV Peak grad:  4.0 mmHg MV Mean grad:  2.0 mmHg      SHUNTS MV Vmax:       1.00  m/s      Systemic VTI:  0.11 m MV Vmean:      56.4 cm/s     Systemic Diam: 1.80 cm MV Decel Time: 146 msec MR Peak grad:   65.0 mmHg MR Mean grad:   42.5 mmHg MR Vmax:        403.00 cm/s MR Vmean:       307.5 cm/s MR PISA:        1.57 cm MR PISA Radius: 0.50 cm MV E velocity: 96.50 cm/s MV A velocity: 44.60 cm/s MV E/A ratio:  2.16 Zoila Shutter MD Electronically signed by Zoila Shutter MD Signature Date/Time: 06/14/2023/12:32:48 PM    Final    DG CHEST PORT 1 VIEW Result Date: 06/14/2023 CLINICAL DATA:  Encounter for central line placement EXAM: PORTABLE CHEST 1 VIEW COMPARISON:  Yesterday FINDINGS: Left IJ line with tip at the upper cavoatrial junction. No pneumothorax or new mediastinal widening. There is stable cardiomegaly and bilateral hazy opacification of the chest. Layering pleural fluid is possible. Artifact from EKG leads. IMPRESSION: 1. New central line without complicating feature. 2. Stable aeration Electronically Signed   By: Tiburcio Pea M.D.   On: 06/14/2023 07:28   DG Foot 2 Views Right Result Date: 06/14/2023 CLINICAL DATA:  63 year old male with history of infection of the first and second toes of the right foot. EXAM: RIGHT FOOT - 2 VIEW COMPARISON:  No priors. FINDINGS: Two views of the right foot demonstrate lucent areas of osseous destruction involving the tuft of the distal phalanx of the great toe, as well as in the second toe involving both the distal and mid phalanges centered around the D IP joint. Overlying soft tissues are irregular with probable soft tissue gas. No other definite osseous lesions are noted. No acute displaced fracture or dislocation. IMPRESSION: 1. 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 Electronically Signed   By: Trudie Reed M.D.   On: 06/14/2023 06:42   MR BRAIN WO CONTRAST Result Date: 06/14/2023 CLINICAL DATA:  Mental  status change, unknown cause EXAM: MRI HEAD WITHOUT CONTRAST TECHNIQUE: Multiplanar, multiecho pulse sequences of the brain and surrounding structures were obtained without intravenous contrast. COMPARISON:  CT head from today. FINDINGS: Brain: Acute right cerebellar infarct. Many small acute infarcts throughout the left frontal, parietal and occipital lobes. More confluent/larger acute or early subacute infarct in the posterior right frontal lobe. No mass occupying acute hemorrhage or midline shift. Remote cerebellar infarcts. No hydrocephalus. Vascular: Major arterial flow voids are maintained at the skull base. Skull and upper cervical spine: Normal marrow signal. Sinuses/Orbits: Clear sinuses.  No acute orbital findings. IMPRESSION: Acute or early subacute confluent infarct in the right frontal lobe. Additional smaller acute infarcts in the right cerebellum, left frontal, parietal and occipital lobes. Given involvement of multiple vascular territories, consider an embolic etiology. Electronically Signed   By: Feliberto Harts M.D.   On: 06/14/2023 02:17   DG Chest Portable 1 View Result Date: 06/13/2023 CLINICAL DATA:  Cough, altered mental status EXAM: PORTABLE CHEST 1 VIEW COMPARISON:  08/18/2022 FINDINGS: Stable cardiomegaly. Aortic atherosclerotic calcification. Diffuse interstitial coarsening greatest in the mid and lower lungs bilaterally.  No focal pneumonia. No pleural effusion or pneumothorax. IMPRESSION: Cardiomegaly with interstitial coarsening which may be due to edema or atypical infection. Electronically Signed   By: Minerva Fester M.D.   On: 06/13/2023 21:10   CT HEAD WO CONTRAST ( ) Result Date: 06/13/2023 CLINICAL DATA:  Neuro deficit, acute, stroke suspected Dizziness, altered mental status EXAM: CT HEAD WITHOUT CONTRAST TECHNIQUE: Contiguous axial images were obtained from the base of the skull through the vertex without intravenous contrast. RADIATION DOSE REDUCTION: This exam was  performed according to the departmental dose-optimization program which includes automated exposure control, adjustment of the mA and/or kV according to patient size and/or use of iterative reconstruction technique. COMPARISON:  None Available. FINDINGS: Brain: Probably acute or subacute infarcts in the posterior right frontal lobe. Possible slight petechial hemorrhage without mass occupying acute hemorrhage. No significant mass effect. Midline shift. No hydrocephalus. No visible mass lesion. Remote bilateral cerebellar infarcts. Vascular: No hyperdense vessel.  Calcific atherosclerosis. Skull: No acute fracture. Sinuses/Orbits: Clear sinuses.  No acute orbital findings. Other: No mastoid effusions. IMPRESSION: 1. Probably acute or subacute infarcts in the posterior right frontal lobe. Recommend MRI. 2. Possible slight petechial hemorrhage without mass occupying acute hemorrhage. 3. No significant mass effect. Electronically Signed   By: Feliberto Harts M.D.   On: 06/13/2023 20:51   - pertinent xrays, CT, MRI studies were reviewed and independently interpreted  Positive ROS: All other systems have been reviewed and were otherwise negative with the exception of those mentioned in the HPI and as above.  Physical Exam: General: Alert, no acute distress Psychiatric: Patient is competent for consent with normal mood and affect Lymphatic: No axillary or cervical lymphadenopathy Cardiovascular: No pedal edema Respiratory: No cyanosis, no use of accessory musculature GI: No organomegaly, abdomen is soft and non-tender    Images:  @ENCIMAGES @  Labs:  Lab Results  Component Value Date   HGBA1C 13.3 (H) 06/13/2023   HGBA1C 11.4 (A) 11/22/2022   HGBA1C 13.6 (H) 08/18/2022   REPTSTATUS PENDING 06/14/2023   CULT  06/14/2023    NO GROWTH < 12 HOURS Performed at [facility], [address] [place], [name] [address]     Lab Results  Component Value Date   ALBUMIN 1.8 (L) 06/14/2023    ALBUMIN 2.1 (L) 06/13/2023   ALBUMIN 2.6 (L) 09/07/2022        Latest Ref Rng & Units 06/15/2023    4:14 AM 06/13/2023    7:59 PM 06/13/2023    7:58 PM  CBC EXTENDED  WBC 4.0 - 10.5 K/uL 15.2     RBC 4.22 - 5.81 MIL/uL 2.94     Hemoglobin 13.0 - 17.0 g/dL 8.1  60.4  54.0   HCT 98.1 - 52.0 % 25.1  30.0  30.0   Platelets 150 - 400 K/uL 127       Neurologic: Patient does not have protective sensation bilateral lower extremities.   MUSCULOSKELETAL:   Skin: Examination there is a wet necrotic ulcer of the second toe right foot with foul-smelling drainage.  There is chronic ischemic changes to the great toe with ulceration to the great toe.  Patient has a palpable dorsalis pedis pulse.  Albumin 1.8 with a hemoglobin A1c of 13.3.  Radiographs of the right foot shows chronic destructive changes of the tuft of the great toe and distal phalanx of the second toe consistent with chronic osteomyelitis.  Assessment: Assessment: Uncontrolled type 2 diabetes with necrotic ulceration of the right second toe with  chronic osteomyelitis of the right great toe and second toe.  Plan: Plan: Possible endovascular evaluation today.  I will plan for right great toe and second toe amputation tomorrow.    Thank you for the consult and the opportunity to see Mr. Antwine Agosto, MD [facility] [phone] 8:11 AM

## 2023-06-15 NOTE — Progress Notes (Signed)
Heart Failure Navigator Progress Note  Assessed for Heart & Vascular TOC clinic readiness.  Patient does not meet criteria due to Advanced Heart Failure team patient of Dr. Gala Romney.   Navigator will sign off at this time.    Rhae Hammock, BSN, Scientist, clinical (histocompatibility and immunogenetics) Only

## 2023-06-15 NOTE — Progress Notes (Addendum)
Pharmacy Antibiotic Note  Valeriano Wandel is a 63 y.o. male admitted on 06/13/2023 with  right foot infection .  Pharmacy has been consulted for Vancomycin dosing. WBC elevated, noted renal dysfunction.   Diabetic foot infection, plans for vascular and ortho intervention on Friday. Reviewed vancomycin doses per MAR. Patient has received load and initial maintenance dose totaling 2750 mg (~35 mg/kg) of vancomycin yesterday in error. (Intended to receive 1500 mg x1, then 1250 mg IV q24h). UOP improved today, Cr down to 1.25. ~24h vancomycin level at 18, indicating good clearance.   Plan: Vancomycin 1500 mg q24h (eAUC 451.8, Cr 1.25) > Start at 2200, 36h after last dose (predicted Cmin ~12) Continue cefepime/metronidazole per MD > Dose adjusted cefepime q12h>q8h per  Monitor renal function, levels as indicated Follow up intraoperative cultures  Height: 6' (182.9 cm) Weight: 77.5 kg (170 lb 13.7 oz) IBW/kg (Calculated) : 77.6  Temp (24hrs), Avg:98 F (36.7 C), Min:97.7 F (36.5 C), Max:98.4 F (36.9 C)  Recent Labs  Lab 06/13/23 1940 06/13/23 1959 06/13/23 2228 06/13/23 2232 06/14/23 0452 06/14/23 1255 06/15/23 0414 06/15/23 1018  WBC 21.8*  --   --   --   --   --  15.2*  --   CREATININE  --  1.90* 1.80*  --  1.84* 1.51* 1.25*  --   LATICACIDVEN  --  2.6*  --  2.3* 8.7*  --   --   --   VANCORANDOM  --   --   --   --   --   --   --  18    Estimated Creatinine Clearance: 66.3 mL/min (A) (by C-G formula based on SCr of 1.25 mg/dL (H)).    No Known Allergies  Lora Paula, PharmD PGY-2 Infectious Diseases Pharmacy Resident [facility] 06/15/2023 2:15 PM

## 2023-06-16 ENCOUNTER — Encounter (HOSPITAL_COMMUNITY): Payer: Self-pay | Admitting: Vascular Surgery

## 2023-06-16 ENCOUNTER — Encounter (HOSPITAL_COMMUNITY)
Admission: EM | Disposition: A | Discharge status: Skilled Nursing Facility | Payer: Self-pay | Source: Home / Self Care | Attending: Internal Medicine

## 2023-06-16 DIAGNOSIS — I70261 Atherosclerosis of native arteries of extremities with gangrene, right leg: Secondary | ICD-10-CM | POA: Diagnosis not present

## 2023-06-16 DIAGNOSIS — R6521 Severe sepsis with septic shock: Secondary | ICD-10-CM | POA: Diagnosis not present

## 2023-06-16 DIAGNOSIS — A419 Sepsis, unspecified organism: Secondary | ICD-10-CM | POA: Diagnosis not present

## 2023-06-16 DIAGNOSIS — I63421 Cerebral infarction due to embolism of right anterior cerebral artery: Secondary | ICD-10-CM | POA: Diagnosis not present

## 2023-06-16 DIAGNOSIS — I513 Intracardiac thrombosis, not elsewhere classified: Secondary | ICD-10-CM | POA: Diagnosis not present

## 2023-06-16 DIAGNOSIS — E1100 Type 2 diabetes mellitus with hyperosmolarity without nonketotic hyperglycemic-hyperosmolar coma (NKHHC): Secondary | ICD-10-CM | POA: Diagnosis not present

## 2023-06-16 HISTORY — PX: ABDOMINAL AORTOGRAM W/LOWER EXTREMITY: CATH118223

## 2023-06-16 LAB — MAGNESIUM: Magnesium: 1.7 mg/dL (ref 1.7–2.4)

## 2023-06-16 LAB — CBC
HCT: 26.1 % — ABNORMAL LOW (ref 39.0–52.0)
Hemoglobin: 8.4 g/dL — ABNORMAL LOW (ref 13.0–17.0)
MCH: 27.5 pg (ref 26.0–34.0)
MCHC: 32.2 g/dL (ref 30.0–36.0)
MCV: 85.3 fL (ref 80.0–100.0)
Platelets: 118 10*3/uL — ABNORMAL LOW (ref 150–400)
RBC: 3.06 MIL/uL — ABNORMAL LOW (ref 4.22–5.81)
RDW: 17.3 % — ABNORMAL HIGH (ref 11.5–15.5)
WBC: 12.6 10*3/uL — ABNORMAL HIGH (ref 4.0–10.5)
nRBC: 0 % (ref 0.0–0.2)

## 2023-06-16 LAB — BASIC METABOLIC PANEL
Anion gap: 8 (ref 5–15)
BUN: 16 mg/dL (ref 8–23)
CO2: 19 mmol/L — ABNORMAL LOW (ref 22–32)
Calcium: 7.6 mg/dL — ABNORMAL LOW (ref 8.9–10.3)
Chloride: 104 mmol/L (ref 98–111)
Creatinine, Ser: 1.26 mg/dL — ABNORMAL HIGH (ref 0.61–1.24)
GFR, Estimated: >60 mL/min (ref >60)
Glucose, Bld: 176 mg/dL — ABNORMAL HIGH (ref 70–99)
Potassium: 4.5 mmol/L (ref 3.5–5.1)
Sodium: 131 mmol/L — ABNORMAL LOW (ref 135–145)

## 2023-06-16 LAB — GLUCOSE, CAPILLARY
Glucose-Capillary: 169 mg/dL — ABNORMAL HIGH (ref 70–99)
Glucose-Capillary: 148 mg/dL — ABNORMAL HIGH (ref 70–99)
Glucose-Capillary: 132 mg/dL — ABNORMAL HIGH (ref 70–99)
Glucose-Capillary: 138 mg/dL — ABNORMAL HIGH (ref 70–99)
Glucose-Capillary: 168 mg/dL — ABNORMAL HIGH (ref 70–99)
Glucose-Capillary: 190 mg/dL — ABNORMAL HIGH (ref 70–99)

## 2023-06-16 LAB — HEPARIN LEVEL (UNFRACTIONATED): Heparin Unfractionated: 0.27 [IU]/mL — ABNORMAL LOW (ref 0.30–0.70)

## 2023-06-16 LAB — VAS US ABI WITH/WO TBI
Left ABI: 1.05
Right ABI: 1.07

## 2023-06-16 LAB — PHOSPHORUS
Phosphorus: 1.6 mg/dL — ABNORMAL LOW (ref 2.5–4.6)
Phosphorus: 3.5 mg/dL (ref 2.5–4.6)

## 2023-06-16 SURGERY — ABDOMINAL AORTOGRAM W/LOWER EXTREMITY
Anesthesia: LOCAL

## 2023-06-16 MED ORDER — HEPARIN (PORCINE) IN NACL 1000-0.9 UT/500ML-% IV SOLN
INTRAVENOUS | Status: DC | PRN
  Administered 2023-06-16 (×2): 500 mL

## 2023-06-16 MED ORDER — LIDOCAINE HCL (PF) 1 % IJ SOLN
INTRAMUSCULAR | Status: DC | PRN
  Administered 2023-06-16: 15 mL via INTRADERMAL

## 2023-06-16 MED ORDER — IODIXANOL 320 MG/ML IV SOLN
INTRAVENOUS | Status: DC | PRN
  Administered 2023-06-16: 30 mL

## 2023-06-16 MED ORDER — MAGNESIUM SULFATE 2 GM/50ML IV SOLN
2.0000 g | Freq: Once | INTRAVENOUS | Status: AC
  Administered 2023-06-16: 2 g via INTRAVENOUS
  Filled 2023-06-16: qty 50

## 2023-06-16 MED ORDER — LIDOCAINE HCL (PF) 1 % IJ SOLN
INTRAMUSCULAR | Status: AC
  Filled 2023-06-16: qty 30

## 2023-06-16 MED ORDER — SODIUM PHOSPHATES 45 MMOLE/15ML IV SOLN
45.0000 mmol | Freq: Once | INTRAVENOUS | Status: AC
  Administered 2023-06-16: 45 mmol via INTRAVENOUS
  Filled 2023-06-16: qty 15

## 2023-06-16 MED ORDER — HEPARIN (PORCINE) 25000 UT/250ML-% IV SOLN
1700.0000 [IU]/h | INTRAVENOUS | Status: DC
  Administered 2023-06-16 – 2023-06-17 (×3): 1900 [IU]/h via INTRAVENOUS
  Administered 2023-06-18: 1850 [IU]/h via INTRAVENOUS
  Administered 2023-06-19: 1800 [IU]/h via INTRAVENOUS
  Administered 2023-06-20: 1700 [IU]/h via INTRAVENOUS
  Administered 2023-06-20: 1800 [IU]/h via INTRAVENOUS
  Administered 2023-06-21: 1700 [IU]/h via INTRAVENOUS
  Filled 2023-06-16 (×8): qty 250

## 2023-06-16 MED ORDER — METOPROLOL TARTRATE 5 MG/5ML IV SOLN
5.0000 mg | Freq: Once | INTRAVENOUS | Status: AC
  Administered 2023-06-16: 5 mg via INTRAVENOUS
  Filled 2023-06-16: qty 5

## 2023-06-16 SURGICAL SUPPLY — 9 items
CATH OMNI FLUSH 5F 65CM (CATHETERS) IMPLANT
DEVICE CLOSURE MYNXGRIP 5F (Vascular Products) IMPLANT
GUIDEWIRE ANGLED .035X150CM (WIRE) IMPLANT
KIT MICROPUNCTURE NIT STIFF (SHEATH) IMPLANT
SET ATX-X65L (MISCELLANEOUS) IMPLANT
SHEATH PINNACLE 5F 10CM (SHEATH) IMPLANT
SHEATH PROBE COVER 6X72 (BAG) IMPLANT
TRAY PV CATH (CUSTOM PROCEDURE TRAY) ×1 IMPLANT
WIRE BENTSON .035X145CM (WIRE) IMPLANT

## 2023-06-16 NOTE — Progress Notes (Addendum)
  Progress Note    06/16/2023 8:42 AM * No surgery found *  Subjective:  no complaints this morning   Vitals:   06/16/23 0600 06/16/23 0736  BP: (!) 92/57   Pulse: 96   Resp: 18   Temp:  (!) 101.2 F (38.4 C)  SpO2: 96%    Physical Exam: Lungs:  non labored Extremities:  R 2nd toe gangrene; malodorous Neurologic: A&O  CBC    Component Value Date/Time   WBC 12.6 (H) 06/16/2023 0404   RBC 3.06 (L) 06/16/2023 0404   HGB 8.4 (L) 06/16/2023 0404   HCT 26.1 (L) 06/16/2023 0404   PLT 118 (L) 06/16/2023 0404   MCV 85.3 06/16/2023 0404   MCH 27.5 06/16/2023 0404   MCHC 32.2 06/16/2023 0404   RDW 17.3 (H) 06/16/2023 0404   LYMPHSABS 0.6 (L) 06/13/2023 1940   MONOABS 0.5 06/13/2023 1940   EOSABS 0.0 06/13/2023 1940   BASOSABS 0.0 06/13/2023 1940    BMET    Component Value Date/Time   NA 131 (L) 06/16/2023 0404   NA 135 11/22/2022 1445   K 4.5 06/16/2023 0404   CL 104 06/16/2023 0404   CO2 19 (L) 06/16/2023 0404   GLUCOSE 176 (H) 06/16/2023 0404   BUN 16 06/16/2023 0404   BUN 24 11/22/2022 1445   CREATININE 1.26 (H) 06/16/2023 0404   CALCIUM 7.6 (L) 06/16/2023 0404   GFRNONAA >60 06/16/2023 0404   GFRAA 89 06/10/2020 1645    INR    Component Value Date/Time   INR 1.7 (H) 06/14/2023 0452     Intake/Output Summary (Last 24 hours) at 06/16/2023 0842 Last data filed at 06/16/2023 0648 Gross per 24 hour  Intake 730.63 ml  Output 1450 ml  Net -719.37 ml     Assessment/Plan:  63 y.o. male with critical limb ischemia R LE  Clinically improving.  VS stable.  Considered acceptable risk for procedures today per Cardiology.  Plan is for Aortogram with R leg runoff and possible intervention.  Despite palpable PT, he is at a high risk for non healing toe amputation given long standing diabetes and low EF.  Case was discussed again with the patient and he is agreeable to proceed.  Continue npo.   [name], PA-C [facility] [phone] 06/16/2023 8:42 AM   VASCULAR STAFF ADDENDUM: I have independently interviewed and examined the patient. I agree with the above.  Patient with abnormal ABI, longstanding history of diabetes, EF of 25%.  I am concerned that with his current level of perfusion, he will not be able to heal a simple toe amputation.  While this is likely going to be due to microvascular disease, any optimization we can do to improve inflow to the foot would help his chances. I discussed this at length with Shaymus.  After discussing risks and benefits of right lower extremity angiogram in an effort to define and improve distal perfusion, Blake elected to proceed.  [name] MD [facility] Phone Number: [phone] 06/16/2023 8:50 AM

## 2023-06-16 NOTE — Progress Notes (Addendum)
NAMEBrekken Thompson, MRN:  [id number], DOB:  08-13-1960, LOS: 3 ADMISSION DATE:  06/13/2023, CONSULTATION DATE:  06/14/2023 REFERRING MD:  Briscoe Deutscher , CHIEF COMPLAINT:  Shock    History of Present Illness:  Derek Thompson is a 63 y.o M w/ PMHx of poorly controlled T2DM, HFrEF (LVEF 25%), LBBB, G2DD and LV global hypokinesis, HTN, CAD, Prostate Cancer treated w/ radiation 2022 who presented to ED for 1 week of progressive generalized weakness, 2 days AMS, admitted for suspected HHS.    Initial labs showed CBG 548, beta-hydroxybutyrate acid 1.04. BNP >4,500, WBC 21.8 w/ Abs Neut 20.5, Hgb A1c 13.3, lactic acid 2.6, Cr 1.98, Serum osmolality 302. VBG: 7.48/27.9/21.1.    Rapid Response 2/12 called for symptomatic hypotension. Started to Levophed gtt with fluids. PCCM consulted.   Pertinent  Medical History  Poorly controlled DM-2 (HbA1c 13.3%), CAD, CHF (systolic and diastolic, EF 25%, G2DD), anemia of chronic illness illness, HTN   Significant Hospital Events: Including procedures, antibiotic start and stop dates in addition to other pertinent events   2/12: Given Rocephin, Zithromax, 2.6 L (NS 0.9% and LR), insulin drip, started on Levophed for hypotension, stopped later in the day.  2/12: R foot xray showed osteomyelitis great toe and 2nd toe, orthopedics consulted. Vancomycin started with addition of Cefepime and Flagyl  2/12: ECHO showed large LV apical thrombus, cardiology consulted. IV heparin.  2/12: ABIs abnormal, VSS consulted.  2/13: Sustained wide QRS tachycardia HR 150s. Given IV digoxin x 2, Po digoxin and po metoprolol. HR stable 90s. Restarted home medication Digoxin 0.125 mg daily  2/14: RLE Angiogram by VVS.   Interim History / Subjective:  Laying in bed, initially sleeping comfortably. Awakens easily. AO x 4. Denies any CP, SOB, Abdominal pain. Denies any N/V. Denies any new weakness.   Objective   Blood pressure (!) 92/57, pulse 96, temperature (!) 101.2 F (38.4 C),  temperature source Oral, resp. rate 18, height 6' (1.829 m), weight 77.5 kg, SpO2 96%.        Intake/Output Summary (Last 24 hours) at 06/16/2023 0917 Last data filed at 06/16/2023 0648 Gross per 24 hour  Intake 716.63 ml  Output 1450 ml  Net -733.37 ml   Filed Weights   06/14/23 0520 06/15/23 0500 06/16/23 0500  Weight: 77.5 kg 77.5 kg 77.5 kg    Examination: General: Appears acutely ill, but no acute distress, on room air.    HENT: Atraumatic and normocephalic  Lungs: clear to auscultation bilaterally, no wheezing and crackles.  Cardiovascular: NSR, normal rate.  Abdomen: soft, NT, ND, bowel sounds present  Extremities: 2+ pitting edema bilateral LE, R foot with wet gangrenous 2nd toe  Neuro: AO x 4  GU: not assessed.   Pertinent Imaging:   CTH: probably acute or subacute infarcts in the posterior right frontal lobe. Possible slight petechial hemorrhage without mass occupying acute Hemorrhage   MR brain: Acute or early subacute confluent infarct in the right frontal lobe. Additional smaller acute infarcts in the right cerebellum, left frontal, parietal and occipital lobes. Given involvement of multiple vascular territories, consider an embolic etiology.   CXR: Cardiomegaly with interstitial coarsening which may be due to edema or atypical infection.   R foot X ray showed osseous destruction involving the tuft of the distal phalanx of the great toe, as well as in the second toe involving both the distal and mid phalanges centered around the DIP joint. Overlying soft tissues are irregular with probable soft tissue gas  ABIs showed Abnormal R and L toe-brachial index    ECHO 2/12: Large 3 x 3 cm round heterogenous mass at the LV apex which is suggestive of thrombus given severe LV dysfunction. EF 20-25%, global hypokinesis, grade II diastolic dysfunction, mildly reduced RV function, mildly elevated pulmonary artery pressure, elevated LVEDP, moderate MR.   Resolved Hospital  Problem list   N/A   Assessment & Plan:  Septic shock, cardiogenic vs distributive   R foot wet gangrene, suspected osteomyelitis  Leukocytosis  Presented with 1 week of generalized weakness, 2 days of AMS, hypotensive admitted for suspected HHS. Lactic acid 2.6 >>2.3>>8.7. Remained hypotensive, did not respond to fluid resuscitation. Started on levophed 2/12, stopped same day as patient is able keep MAP > 60. R foot x ray concerning for probable osteomyelitis. ABIs showed Abnormal R and L toe-brachial index.  Spiked a temperature 101.2 AM, is on appropriate Abx coverage with plans for procedures today.    - S/p RLE angiogram by VVS today 2/14, findings of small vessel disease with no opportunities to reduce major amputation risk.  - R great toe and 2nd toe amputation by orthopedics pending.  - Continue IV Vancomycin, IV cefepime and PO flagyl 500 BID  - Discontinued Levophed 2/12, able to maintain MAP >65   Acute to early Subacute CVAs  MR brain findings multiple infarcts concerning for embolic etiology. Neurology on board. Per exam, no acute focal deficits. AO x 4, no dysarthria. ECHO findings concerning for large 3 x 3 cm mass at the LV apex suggestive for thrombus. Cardiology consulted.  - Start IV heparin post RLE angiogram   - If any acute neurological change, get STAT CT head  - Continue ASA and Statin - Will plan for CTA head and neck as Cr tolerates    - PT/OT/SLP  - NIHSS    CHF (systolic and diastolic), EF 20-25% LV apical thrombus 06/14/2023 Hx CAD HTN Wide-QRS tachycardia ECHO EF 20-25%, global hypokinesis, grade II diastolic dysfunction, dilated IVC with 3x3 cm mass at LV apex suggestive of thrombus. EKG 2/13 showed HR 156, wide QRS 140 ms and QTC 541 ms. LBBB. Given PO & IV digoxin 0.125 mg x2 and metoprolol 12.5 mg, HR stabilized. Overnight, tachycardiac episode, increased metoprolol 25 mg daily.    - Digoxin 0.125 mg daily - Metoprolol 25 mg BID  - ASA and Statin -  Hold home GDMT (spiro 25 mg daily, Entresto 97-103 mg BID) in the setting of hypotension    HHS Poorly controlled DM-2 (HbA1c 13.3%), not on insulin  AGMA  Lactic Acidosis Presented with CBG 548, beta-hydroxybutyrate acid 1.04, and  Serum osmolality 302. Initially started on endotool/IV insulin in the ED, transitioned to SSI and semglee 12 units daily.  - Will continue to follow up on daily BMETs - Glycemic control  - Semglee 12 units daily and SSI   AKI  Cr 1.26, baseline around 0.99. Creatinine is improving.  - S/p IV NS 10-20 mL/hr infusion  - continue to trend Cr   Normocytic Anemia  Thrombocytopenia Presented with Hgb 8.9, baseline ~10-11. Labs showed Iron 22, TIBC 161, saturation 14. Ferritin 264. Vit B12 908. IDA can be likely due to the current infectious/inflammatory process. Today, Hgb 8.4 and PLT 118 - Consider Iron repletion when sepsis resolves.    Elevated TSH and T4 TSH 5.6 and T4 1.38, does not have a history of thyroid disease. Likely elevated in the setting of acute processes as above. - Recommend recheck when  patient is overall stable in the outpatient setting    HypoMag Replete and recheck - Continue to monitor Mg    Hypophostamenia  Replete and recheck  - continue to monitor    Hypokalemia  Resolved. Monitor.   Best Practice (right click and "Reselect all SmartList Selections" daily)   Diet/type: Heart Healthy  DVT prophylaxis systemic heparin Pressure ulcer(s): assess by RN GI prophylaxis: PPI Lines: Central line Foley:  N/A Code Status:  full code Last date of multidisciplinary goals of care discussion [--]  Labs   CBC: Recent Labs  Lab 06/13/23 1940 06/13/23 1958 06/13/23 1959 06/15/23 0414 06/16/23 0404  WBC 21.8*  --   --  15.2* 12.6*  NEUTROABS 20.5*  --   --   --   --   HGB 8.9* 10.2* 10.2* 8.1* 8.4*  HCT 27.9* 30.0* 30.0* 25.1* 26.1*  MCV 87.5  --   --  85.4 85.3  PLT 195  --   --  127* 118*    Basic Metabolic Panel: Recent  Labs  Lab 06/13/23 2228 06/14/23 0452 06/14/23 1255 06/14/23 2004 06/15/23 0414 06/16/23 0404  NA 127* 130* 130*  --  135 131*  K 3.8 3.9 4.0  --  3.4* 4.5  CL 97* 99 102  --  105 104  CO2 19* 13* 19*  --  20* 19*  GLUCOSE 479* 195* 229*  --  162* 176*  BUN 20 20 20   --  17 16  CREATININE 1.80* 1.84* 1.51*  --  1.25* 1.26*  CALCIUM 7.7* 8.0* 7.7*  --  7.7* 7.6*  MG 1.5* 2.3  --   --  2.1 1.7  PHOS  --  1.6*  --  2.3* 2.8 1.6*   GFR: Estimated Creatinine Clearance: 65.8 mL/min (A) (by C-G formula based on SCr of 1.26 mg/dL (H)). Recent Labs  Lab 06/13/23 1940 06/13/23 1959 06/13/23 2232 06/14/23 0452 06/15/23 0414 06/16/23 0404  PROCALCITON  --   --   --  23.25  --   --   WBC 21.8*  --   --   --  15.2* 12.6*  LATICACIDVEN  --  2.6* 2.3* 8.7*  --   --     Liver Function Tests: Recent Labs  Lab 06/13/23 1939 06/14/23 0452  AST 29 26  ALT 27 22  ALKPHOS 49 41  BILITOT 0.7 0.6  PROT 6.7 6.0*  ALBUMIN 2.1* 1.8*   Recent Labs  Lab 06/13/23 1939  LIPASE 18   Recent Labs  Lab 06/13/23 2031  AMMONIA 23    ABG    Component Value Date/Time   PHART 7.392 10/14/2022 0804   PCO2ART 33.8 10/14/2022 0804   PO2ART 96 10/14/2022 0804   HCO3 21.1 06/13/2023 1958   TCO2 20 (L) 06/13/2023 1959   ACIDBASEDEF 2.0 06/13/2023 1958   O2SAT 66.2 06/14/2023 1250     Coagulation Profile: Recent Labs  Lab 06/14/23 0452  INR 1.7*    Cardiac Enzymes: No results for input(s): "CKTOTAL", "CKMB", "CKMBINDEX", "TROPONINI" in the last 168 hours.  HbA1C: Hemoglobin A1C  Date/Time Value Ref Range Status  11/22/2022 01:32 PM 11.4 (A) 4.0 - 5.6 % Final   Hgb A1c MFr Bld  Date/Time Value Ref Range Status  06/13/2023 07:40 PM 13.3 (H) 4.8 - 5.6 % Final    Comment:    (NOTE) Pre diabetes:          5.7%-6.4%  Diabetes:              >  6.4%  Glycemic control for   <7.0% adults with diabetes   08/18/2022 06:09 PM 13.6 (H) 4.8 - 5.6 % Final    Comment:    (NOTE) Pre  diabetes:          5.7%-6.4%  Diabetes:              >6.4%  Glycemic control for   <7.0% adults with diabetes     CBG: Recent Labs  Lab 06/15/23 1732 06/15/23 1929 06/15/23 2310 06/16/23 0320 06/16/23 0739  GLUCAP 214* 228* 219* 169* 148*    Review of Systems:   Denies any CP, SOB, Abdominal pain. Denies any N/V. Denies any new weakness.   Past Medical History:  He,  has a past medical history of Anemia, Diabetes mellitus without complication (HCC) (1987), Family history of breast cancer, Hypertension, Myocardial infarction [facility]), and Prostate cancer (HCC).   Surgical History:   Past Surgical History:  Procedure Laterality Date   DENTAL SURGERY     RADIOACTIVE SEED IMPLANT N/A 02/04/2021   Procedure: RADIOACTIVE SEED IMPLANT/BRACHYTHERAPY IMPLANT;  Surgeon: Jannifer Hick, MD;  Location: [facility] ORS;  Service: Urology;  Laterality: N/A;   RIGHT/LEFT HEART CATH AND CORONARY ANGIOGRAPHY N/A 05/25/2020   Procedure: RIGHT/LEFT HEART CATH AND CORONARY ANGIOGRAPHY;  Surgeon: Swazil[place], Peter M, MD;  Location: [facility] INVASIVE CV LAB;  Service: Cardiovascular;  Laterality: N/A;   RIGHT/LEFT HEART CATH AND CORONARY ANGIOGRAPHY N/A 10/14/2022   Procedure: RIGHT/LEFT HEART CATH AND CORONARY ANGIOGRAPHY;  Surgeon: Dolores Patty, MD;  Location: [facility] INVASIVE CV LAB;  Service: Cardiovascular;  Laterality: N/A;   SPACE OAR INSTILLATION N/A 02/04/2021   Procedure: SPACE OAR INSTILLATION;  Surgeon: Jannifer Hick, MD;  Location: [facility] ORS;  Service: Urology;  Laterality: N/A;     Social History:   reports that he quit smoking about 6 years ago. His smoking use included cigarettes. He started smoking about 49 years ago. He has a 43 pack-year smoking history. He has never used smokeless tobacco. He reports that he does not currently use alcohol. He reports that he does not currently use drugs.   Family History:  His family history includes Breast cancer in his mother; Cancer in his maternal grandfather.    Allergies No Known Allergies   Home Medications  Prior to Admission medications   Medication Sig Start Date End Date Taking? Authorizing Provider  Accu-Chek Softclix Lancets lancets SMARTSIG:Topical 1-4 Times Daily 08/20/22  Yes Atway, Rayann N, DO  aspirin EC 81 MG tablet Take 1 tablet (81 mg total) by mouth daily. Swallow whole. 09/07/22 09/07/23 Yes Simmons, Brittainy M, PA-C  blood glucose meter kit and supplies KIT Dispense based on patient and insurance preference. Use up to four times daily as directed. (FOR ICD-9 250.00, 250.01). 08/20/22  Yes Atway, Rayann N, DO  digoxin (LANOXIN) 0.125 MG tablet Take 1 tablet (0.125 mg total) by mouth daily. 09/07/22  Yes Robbie Lis M, PA-C  ezetimibe (ZETIA) 10 MG tablet Take 1 tablet (10 mg total) by mouth daily. 11/25/22  Yes Rana Snare, DO  furosemide (LASIX) 20 MG tablet Take 1 tablet (20 mg total) by mouth daily. 10/05/22 10/05/23 Yes Bensimhon, Bevelyn Buckles, MD  glucose blood (CONTOUR NEXT TEST) test strip Use to check blood sugar up to 3 times daily as instructed 11/22/22  Yes Rana Snare, DO  ibuprofen (ADVIL) 200 MG tablet Take 400 mg by mouth every 6 (six) hours as needed for headache or mild pain (pain score 1-3).  Yes [provider]  metoprolol succinate (TOPROL-XL) 25 MG 24 hr tablet TAKE 1 TABLET BY MOUTH DAILY 09/12/22  Yes Lyndle Herrlich, MD  spironolactone (ALDACTONE) 25 MG tablet Take 1 tablet (25 mg total) by mouth daily. 10/05/22  Yes Bensimhon, Bevelyn Buckles, MD  atorvastatin (LIPITOR) 40 MG tablet TAKE 1 TABLET BY MOUTH EVERY DAY Patient not taking: Reported on 06/13/2023 09/12/22   Lyndle Herrlich, MD  empagliflozin (JARDIANCE) 10 MG TABS tablet Take 1 tablet (10 mg total) by mouth daily before breakfast. Patient not taking: Reported on 06/13/2023 11/22/22   Rana Snare, DO  ferrous sulfate 325 (65 FE) MG tablet TAKE 1 TABLET BY MOUTH EVERY DAY Patient not taking: Reported on 06/13/2023 09/20/22   Lyndle Herrlich, MD  gabapentin (NEURONTIN) 300 MG capsule TAKE 1 CAPSULE BY MOUTH TWICE A DAY Patient not taking: Reported on 06/13/2023 09/29/22   Lyndle Herrlich, MD  insulin glargine (LANTUS SOLOSTAR) 100 UNIT/ML Solostar Pen INJECT 16 UNITS INTO THE SKIN AT BEDTIME. Patient not taking: Reported on 06/13/2023 10/10/22   Lyndle Herrlich, MD  metFORMIN (GLUCOPHAGE) 1000 MG tablet TAKE 1 TABLET (1,000 MG TOTAL) BY MOUTH TWICE A DAY WITH FOOD Patient not taking: Reported on 06/13/2023 10/28/22   Bensimhon, Bevelyn Buckles, MD  Multiple Vitamin (MULTIVITAMIN) tablet Take 1 tablet by mouth daily. Patient not taking: Reported on 06/13/2023    [provider]  sacubitril-valsartan (ENTRESTO) 97-103 MG Take 1 tablet by mouth 2 (two) times daily. Patient not taking: Reported on 06/13/2023 09/20/22   Bensimhon, Bevelyn Buckles, MD  tamsulosin (FLOMAX) 0.4 MG CAPS capsule TAKE 1 CAPSULE BY MOUTH EVERYDAY AT BEDTIME Patient not taking: Reported on 06/13/2023 09/12/22   Lyndle Herrlich, MD     Critical care time:

## 2023-06-16 NOTE — Progress Notes (Signed)
[facility] ADULT ICU REPLACEMENT PROTOCOL   The patient does apply for the [facility] Adult ICU Electrolyte Replacment Protocol based on the criteria listed below:   1.Exclusion criteria: TCTS, ECMO, Dialysis, and Myasthenia Gravis patients 2. Is GFR >/= 30 ml/min? Yes.    Patient's GFR today is >60 3. Is SCr </= 2? Yes.   Patient's SCr is 1.26 mg/dL 4. Did SCr increase >/= 0.5 in 24 hours? No. 5.Pt's weight >40kg  Yes.   6. Abnormal electrolyte(s): Magnesium, Phosphorus  7. Electrolytes replaced per protocol 8.  Call MD STAT for K+ </= 2.5, Phos </= 1, or Mag </= 1 Physician:  Dr. Faye Ramsay Derek Thompson 06/16/2023 4:54 AM

## 2023-06-16 NOTE — Progress Notes (Signed)
Confirmed with MD Lajoyce Corners that diet can be ordered for patient post vascular procedure.  MD Lajoyce Corners advised he will sill see how patient's foot progresses after revascularization to determine surgical plan.

## 2023-06-16 NOTE — Progress Notes (Signed)
PHARMACY - ANTICOAGULATION CONSULT NOTE  Pharmacy Consult for heparin Indication:  apex thrombus  Labs: Recent Labs    06/13/23 1940 06/13/23 1958 06/13/23 1959 06/13/23 2228 06/14/23 0452 06/14/23 1255 06/14/23 1405 06/14/23 2004 06/15/23 0414 06/15/23 1018 06/15/23 1844 06/16/23 0404  HGB 8.9*   < > 10.2*  --   --   --   --   --  8.1*  --   --  8.4*  HCT 27.9*   < > 30.0*  --   --   --   --   --  25.1*  --   --  26.1*  PLT 195  --   --   --   --   --   --   --  127*  --   --  118*  APTT  --   --   --   --  32  --   --   --   --   --   --   --   LABPROT  --   --   --   --  20.2*  --   --   --   --   --   --   --   INR  --   --   --   --  1.7*  --   --   --   --   --   --   --   HEPARINUNFRC  --   --   --   --   --   --   --    < >  --  <0.10* 0.19* 0.27*  CREATININE  --   --  1.90*   < > 1.84* 1.51*  --   --  1.25*  --   --   --   TROPONINIHS  --   --   --   --   --   --  66*  --   --   --   --   --    < > = values in this interval not displayed.   Assessment: 63yo male remains subtherapeutic on heparin after rate change but approaching goal; no infusion issues or signs of bleeding per RN.  Goal of Therapy:  Heparin level 0.3-0.5 units/ml   Plan:  Increase heparin infusion by 10% to 2000 units/hr. F/u after procedure.   Vernard Gambles, PharmD, BCPS 06/16/2023 4:38 AM

## 2023-06-16 NOTE — Progress Notes (Signed)
STROKE TEAM PROGRESS NOTE   SIGNIFICANT HOSPITAL EVENTS 2/12 -admit to ICU  INTERIM HISTORY/SUBJECTIVE Patient is lying comfortably in bed and is doing better.   Remains on IV heparin for LV thrombus.  Vascular surgery plan aortogram today with right leg runoff and possible intervention. Neurological exam is unchanged.  CT head and neck on hold due to mild renal insufficiency.   OBJECTIVE  CBC    Component Value Date/Time   WBC 12.6 (H) 06/16/2023 0404   RBC 3.06 (L) 06/16/2023 0404   HGB 8.4 (L) 06/16/2023 0404   HCT 26.1 (L) 06/16/2023 0404   PLT 118 (L) 06/16/2023 0404   MCV 85.3 06/16/2023 0404   MCH 27.5 06/16/2023 0404   MCHC 32.2 06/16/2023 0404   RDW 17.3 (H) 06/16/2023 0404   LYMPHSABS 0.6 (L) 06/13/2023 1940   MONOABS 0.5 06/13/2023 1940   EOSABS 0.0 06/13/2023 1940   BASOSABS 0.0 06/13/2023 1940    BMET    Component Value Date/Time   NA 131 (L) 06/16/2023 0404   NA 135 11/22/2022 1445   K 4.5 06/16/2023 0404   CL 104 06/16/2023 0404   CO2 19 (L) 06/16/2023 0404   GLUCOSE 176 (H) 06/16/2023 0404   BUN 16 06/16/2023 0404   BUN 24 11/22/2022 1445   CREATININE 1.26 (H) 06/16/2023 0404   CALCIUM 7.6 (L) 06/16/2023 0404   EGFR 86 11/22/2022 1445   GFRNONAA >60 06/16/2023 0404    IMAGING past 24 hours No results found.   Vitals:   06/16/23 1100 06/16/23 1114 06/16/23 1115 06/16/23 1118  BP: (!) 94/58  (!) 92/55   Pulse: (!) 119  (!) 117 88  Resp: (!) 24  19 20   Temp:  98.7 F (37.1 C)    TempSrc:  Oral    SpO2: 93%  98% 98%  Weight:      Height:         PHYSICAL EXAM General:  Alert, malnourished looking middle-aged African-American male patient in no acute distress Psych:  Mood and affect appropriate for situation CV: Regular rate and rhythm on monitor Respiratory:  Regular, unlabored respirations on room air GI: Abdomen soft and nontender Gangrene right foot toes  NEURO:  Mental Status: AA&Ox3, patient is able to give clear and coherent  history Speech/Language: speech is without dysarthria or aphasia.  Naming, repetition, fluency, and comprehension intact.  Cranial Nerves:  II: PERRL.  Left field cut III, IV, VI: EOMI. Eyelids elevate symmetrically.  V: Sensation is intact to light touch and symmetrical to face.  VII: Face is symmetrical resting and smiling VIII: hearing intact to voice. IX, X: Palate elevates symmetrically. Phonation is normal.  UJ:WJXBJYNW shrug 5/5. XII: tongue is midline without fasciculations. Motor: Left grip is slightly weaker. Slowed RAM on the left.  Tone: is normal and bulk is normal Sensation- Intact to light touch bilaterally. Extinction absent to light touch to DSS.   Coordination: FTN intact bilaterally, HKS: no ataxia in BLE.No drift.  Gait- deferred  Most Recent NIH 3     ASSESSMENT/PLAN  [name] is a 63 y.o. male with history of HTN, DM, CHF admitted for 1 week of altered mental status.  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  Additionally he has gangrene and osteo in his right lower extremity (first and second toe on the right foot) with further workup underway.  He will likely require an amputation on Friday with the orthopedic team.  Vascular surgery is additionally planning for a right lower extremity angiogram tomorrow.  NIH on Admission 4  Acute Ischemic Infarct:  right frontal infarct  Etiology:  embolic with apical mass likely thrombus and low EF on echo CTA head & neck pending MRI  Acute or early subacute confluent infarct in the right frontal lobe. Additional smaller acute infarcts in the right cerebellum, left frontal, parietal and occipital lobes.  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 VVS and Ortho consulted Right lower extremity angiogram planned for 2/13 Possible amputation Friday Abx per CCM  Hyperlipidemia Home meds: Atorvastatin 40 mg, resumed in hospital LDL 109, goal < 70 Continue statin at discharge  Diabetes type II Uncontrolled Home meds: Metoprolol, insulin HgbA1c 13.3, goal < 7.0 CBGs SSI Recommend close follow-up with PCP for better DM control  Dysphagia Patient has post-stroke dysphagia, SLP consulted    Diet   Diet NPO time specified   Advance diet as tolerated  Other Stroke Risk Factors Obesity, Body mass index is 23.17 kg/m., BMI >/= 30 associated with increased stroke risk, recommend weight loss, diet and exercise as appropriate    Hospital day # 3  Patient admitted with generalized weakness, gangrenous changes in the right second toe and hypotension and hyperglycemia and acidosis.  MRI scan of the brain showed bilateral embolic infarcts likely from cardiogenic etiology from LV thrombus and cardiomyopathy.  Recommend anticoagulation with IV heparin and and switch to Eliquis after vascular surgery procedure and amputation is done.  Continue aggressive risk factor modification.  Okay to postpone CT angiogram due to borderline renal function and findings are unlikely to impact treatment decision at this time.  Maintain aggressive hydration if cardiac situation allows..  Treatment of gangrene with antibiotics and amputation as per CCM and vascular surgery.   D/w Dr Francine Graven Stroke team will sign off.  Kindly call for questions. Greater than 50% time during this 35-minute visit was spent in counseling and coordination of care about his embolic stroke and discussion with patient and care team and answering  questions.   Delia Heady, MD Medical Director [facility] Pager: [phone] 06/16/2023 12:10 PM   To contact Stroke Continuity provider, please refer to [email or web address]. After hours, contact General Neurology

## 2023-06-16 NOTE — TOC Progression Note (Signed)
Transition of Care [facility]) - Progression Note    Patient Details  Name: Derek Thompson MRN: [id number] Date of Birth: 1961/02/04  Transition of Care [facility]) CM/SW Contact  Marliss Coots, LCSW Phone Number: 06/16/2023, 2:37 PM  Clinical Narrative:     2:38 PM Per progressions, patient has planned angiogram and toe amputation procedure scheduled for today.  Expected Discharge Plan: Skilled Nursing Facility Barriers to Discharge: Continued Medical Work up  Expected Discharge Plan and Services In-house Referral: Clinical Social Work     Living arrangements for the past 2 months: Hotel/Motel                                       Social Determinants of Health (SDOH) Interventions SDOH Screenings   Food Insecurity: No Food Insecurity (06/14/2023)  Housing: Low Risk  (06/14/2023)  Transportation Needs: No Transportation Needs (06/14/2023)  Utilities: Not At Risk (06/14/2023)  Alcohol Screen: Low Risk  (08/19/2022)  Depression (PHQ2-9): Medium Risk (11/22/2022)  Financial Resource Strain: Low Risk  (08/19/2022)  Tobacco Use: Medium Risk (06/14/2023)    Readmission Risk Interventions     No data to display

## 2023-06-16 NOTE — Progress Notes (Signed)
PHARMACY - ANTICOAGULATION CONSULT NOTE  Pharmacy Consult for IV heparin Indication: LV thrombus + thromboembolic vs ischemic stroke   No Known Allergies  Patient Measurements: Height: 6' (182.9 cm) Weight: 77.5 kg (170 lb 13.7 oz) IBW/kg (Calculated) : 77.6 Heparin Dosing Weight: Actual body weight  Vital Signs: Temp: 97.9 F (36.6 C) (02/14 1552) Temp Source: Oral (02/14 1552) BP: 99/66 (02/14 1545) Pulse Rate: 92 (02/14 1545)  Labs: Recent Labs    06/13/23 1940 06/13/23 1958 06/13/23 1959 06/13/23 2228 06/14/23 0452 06/14/23 1255 06/14/23 1405 06/14/23 2004 06/15/23 0414 06/15/23 1018 06/15/23 1844 06/16/23 0404  HGB 8.9*   < > 10.2*  --   --   --   --   --  8.1*  --   --  8.4*  HCT 27.9*   < > 30.0*  --   --   --   --   --  25.1*  --   --  26.1*  PLT 195  --   --   --   --   --   --   --  127*  --   --  118*  APTT  --   --   --   --  32  --   --   --   --   --   --   --   LABPROT  --   --   --   --  20.2*  --   --   --   --   --   --   --   INR  --   --   --   --  1.7*  --   --   --   --   --   --   --   HEPARINUNFRC  --   --   --   --   --   --   --    < >  --  <0.10* 0.19* 0.27*  CREATININE  --   --  1.90*   < > 1.84* 1.51*  --   --  1.25*  --   --  1.26*  TROPONINIHS  --   --   --   --   --   --  66*  --   --   --   --   --    < > = values in this interval not displayed.    Estimated Creatinine Clearance: 65.8 mL/min (A) (by C-G formula based on SCr of 1.26 mg/dL (H)).  Assessment: Dejour Vos is a 63 y.o M w/ PMHx of poorly controlled T2DM, HFrEF (LVEF 25%), LBBB, G2DD and LV global hypokinesis, HTN, CAD, Prostate Cancer treated w/ radiation 2022 who presented to ED for 1 week of progressive generalized weakness, 2 days AMS, admitted for suspected HHS. 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 Note patient also with acute/subacute infarct in R frontal lobe, with suspicion for embolic  etiology.  Heparin level 0.27 (just slightly subthereapeutic) this a.m. on 1850 units/hr. S/p vascular procedure and ok per vascular to restart heparin at 6pm.  Goal of Therapy:  Heparin level 0.3-0.5 units/ml  (targeting low end for concominant CVA/ w/ petechial hemorrhage) Monitor platelets by anticoagulation protocol: Yes   Plan:  At 1800, start heparin infusion at 1900 units/hr  8 hr heparin level  Thank you for allowing pharmacy to participate in this patient's care,  Christoper Fabian, PharmD, BCPS Please  see amion for complete clinical pharmacist phone list 06/16/2023 3:57 PM

## 2023-06-16 NOTE — Progress Notes (Signed)
OT Cancellation Note  Patient Details Name: Derek Thompson MRN: [id number] DOB: 03-Sep-1960   Cancelled Treatment:    Reason Eval/Treat Not Completed: Patient at procedure or test/ unavailable Patient off floor for aortogram and assessment of RLE. OT will follow back as time permits.   Pollyann Glen E. Nalah Macioce, OTR/L Acute Rehabilitation Services [phone]   Cherlyn Cushing 06/16/2023, 1:33 PM

## 2023-06-16 NOTE — Progress Notes (Signed)
PT Cancellation Note  Patient Details Name: Derek Thompson MRN: [id number] DOB: Aug 12, 1960   Cancelled Treatment:    Reason Eval/Treat Not Completed: Patient at procedure or test/unavailable  Off unit to cath lab. Will follow-up as schedule permits.   Kathlyn Sacramento, PT, DPT [facility]  Rehabilitation Services Physical Therapist Office: [phone] Website: [email or web address]  Berton Mount 06/16/2023, 1:44 PM

## 2023-06-16 NOTE — Op Note (Signed)
DATE OF SERVICE: 06/16/2023  PATIENT:  Derek Thompson  63 y.o. male  PRE-OPERATIVE DIAGNOSIS:  Atherosclerosis of native arteries of right lower extremity causing ulceration; diabetic foot infection  POST-OPERATIVE DIAGNOSIS:  Same  PROCEDURE:   1) Ultrasound guided left common femoral artery access 2) Aortogram 3) Right lower extremity angiogram with second order cannulation   SURGEON:  Rande Brunt. Lenell Antu, MD  ASSISTANT: none  ANESTHESIA:   local  ESTIMATED BLOOD LOSS: minimal  LOCAL MEDICATIONS USED:  LIDOCAINE   COUNTS: confirmed correct.  PATIENT DISPOSITION:  PACU - hemodynamically stable.   Delay start of Pharmacological VTE agent (>24hrs) due to surgical blood loss or risk of bleeding: no  INDICATION FOR PROCEDURE: Derek Thompson is a 62 y.o. male with right great toe ulceration and diabetic foot infection. After careful discussion of risks, benefits, and alternatives the patient was offered angiogram. The patient understood and wished to proceed.  OPERATIVE FINDINGS:   Left renal artery: patent Right renal artery: patent  Infrarenal aorta: patent  Left common iliac artery: patent Right common iliac artery: patent  Left internal iliac artery: patent Right internal iliac artery: patent  Left external iliac artery: patent Right external iliac artery: patent  Left common femoral artery: not studied Right common femoral artery: patent  Left profunda femoris artery: not studied Right profunda femoris artery: patent  Left superficial femoral artery: not studied Right superficial femoral artery: patent; mild <50% stenosis in mid section  Left popliteal artery: not studied Right popliteal artery: patent  Left anterior tibial artery: not studied Right anterior tibial artery: patent  Left tibioperoneal trunk: not studied Right tibioperoneal trunk: patent  Left peroneal artery: not studied Right peroneal artery: patent  Left posterior tibial artery: not studied Right posterior  tibial artery: patent  Left pedal circulation: not studied Right pedal circulation: disadvantaged   GLASS score. N/A.  WIfI score. 2 / 0 / 2. Stage III.  DESCRIPTION OF PROCEDURE: After identification of the patient in the pre-operative holding area, the patient was transferred to the operating room. The patient was positioned supine on the operating room table. The groins was prepped and draped in standard fashion. A surgical pause was performed confirming correct patient, procedure, and operative location.  The left groin was anesthetized with subcutaneous injection of 1% lidocaine. Using ultrasound guidance, the left common femoral artery was accessed with micropuncture technique.  Fluoroscopy was used to confirm cannulation over the femoral head. The 34F micropuncture sheath was upsized to 77F.   A Benson wire was advanced into the distal aorta. Over the wire an omni flush catheter was advanced to the level of L2. Aortogram was performed - see above for details.   The right common iliac artery was selected with an omniflush catheter and benson guidewire. The wire was advanced into the common femoral artery. Over the wire the omni flush catheter was advanced into the external iliac artery. Selective angiography was performed - see above for details.   A mynx was used to close the arteriotomy. Hemostasis was excellent upon completion.   Upon completion of the case instrument and sharps counts were confirmed correct. The patient was transferred to the PACU in good condition. I was present for all portions of the procedure.  PLAN: small vessel disease in foot is likely culprit for foot infection. No opportunities for reducing major amputation risk.   Rande Brunt. Lenell Antu, MD [facility] Phone Number: [phone] 06/16/2023 2:13 PM

## 2023-06-16 NOTE — Progress Notes (Signed)
Progress Note  Patient Name: Derek Thompson Date of Encounter: 06/16/2023  Primary Cardiologist: [name], MD   Subjective   Patient seen and examined at his bedside. He was awake when I arrived.  Episodes of fast heart rate overnight.  His metoprolol was increased.  Inpatient Medications    Scheduled Meds:  aspirin EC  81 mg Oral Daily   atorvastatin  80 mg Oral Daily   Chlorhexidine Gluconate Cloth  6 each Topical Daily   digoxin  0.125 mg Oral Daily   insulin aspart  0-9 Units Subcutaneous Q4H   insulin glargine-yfgn  12 Units Subcutaneous Daily   metoprolol tartrate  25 mg Oral BID   metroNIDAZOLE  500 mg Oral Q12H   pantoprazole  40 mg Oral Daily   sodium chloride flush  10-40 mL Intracatheter Q12H   Continuous Infusions:  ceFEPime (MAXIPIME) IV 2 g (06/16/23 0353)   heparin 2,000 Units/hr (06/16/23 0441)   sodium phosphate 45 mmol in sodium chloride 0.9 % 250 mL infusion 45 mmol (06/16/23 0635)   vancomycin 1,500 mg (06/15/23 2048)   PRN Meds: acetaminophen **OR** acetaminophen, ondansetron (ZOFRAN) IV, mouth rinse, sodium chloride flush   Vital Signs    Vitals:   06/16/23 0500 06/16/23 0557 06/16/23 0600 06/16/23 0736  BP: (!) 94/54  (!) 92/57   Pulse: (!) 102 97 96   Resp: (!) 24 20 18    Temp:  98 F (36.7 C)  (!) 101.2 F (38.4 C)  TempSrc:  Oral  Oral  SpO2: 91% 96% 96%   Weight: 77.5 kg     Height:        Intake/Output Summary (Last 24 hours) at 06/16/2023 0758 Last data filed at 06/16/2023 1478 Gross per 24 hour  Intake 744.63 ml  Output 1450 ml  Net -705.37 ml   Filed Weights   06/14/23 0520 06/15/23 0500 06/16/23 0500  Weight: 77.5 kg 77.5 kg 77.5 kg    Telemetry    Sinus rhythm  - Personally Reviewed  ECG    - Personally Reviewed  Physical Exam   General: Comfortable Head: Atraumatic, normal size  Eyes: PEERLA, EOMI  Neck: Supple, normal JVD Cardiac: Normal S1, S2; RRR; no murmurs, rubs, or gallops Lungs: Clear to  auscultation bilaterally Abd: Soft, nontender, no hepatomegaly  Ext: warm,left feet/toes gangrenous  Musculoskeletal: No deformities, BUE and BLE strength normal and equal Skin: Warm and dry, no rashes   Neuro: Alert and oriented to person, place, time, and situation, CNII-XII grossly intact, no focal deficits  Psych: Normal mood and affect   Labs    Chemistry Recent Labs  Lab 06/13/23 1939 06/13/23 1958 06/14/23 0452 06/14/23 1255 06/15/23 0414 06/16/23 0404  NA 126*   < > 130* 130* 135 131*  K 4.1   < > 3.9 4.0 3.4* 4.5  CL 91*   < > 99 102 105 104  CO2 19*   < > 13* 19* 20* 19*  GLUCOSE 545*   < > 195* 229* 162* 176*  BUN 19   < > 20 20 17 16   CREATININE 1.98*   < > 1.84* 1.51* 1.25* 1.26*  CALCIUM 8.5*   < > 8.0* 7.7* 7.7* 7.6*  PROT 6.7  --  6.0*  --   --   --   ALBUMIN 2.1*  --  1.8*  --   --   --   AST 29  --  26  --   --   --  ALT 27  --  22  --   --   --   ALKPHOS 49  --  41  --   --   --   BILITOT 0.7  --  0.6  --   --   --   GFRNONAA 37*   < > 41* 52* >60 >60  ANIONGAP 16*   < > 18* 9 10 8    < > = values in this interval not displayed.     Hematology Recent Labs  Lab 06/13/23 1940 06/13/23 1958 06/13/23 1959 06/15/23 0414 06/16/23 0404  WBC 21.8*  --   --  15.2* 12.6*  RBC 3.19*  --   --  2.94* 3.06*  HGB 8.9*   < > 10.2* 8.1* 8.4*  HCT 27.9*   < > 30.0* 25.1* 26.1*  MCV 87.5  --   --  85.4 85.3  MCH 27.9  --   --  27.6 27.5  MCHC 31.9  --   --  32.3 32.2  RDW 16.8*  --   --  17.0* 17.3*  PLT 195  --   --  127* 118*   < > = values in this interval not displayed.    Cardiac EnzymesNo results for input(s): "TROPONINI" in the last 168 hours. No results for input(s): "TROPIPOC" in the last 168 hours.   BNP Recent Labs  Lab 06/13/23 1937  BNP >4,500.0*     DDimer No results for input(s): "DDIMER" in the last 168 hours.   Radiology    VAS [place] ABI WITH/WO TBI Result Date: 06/14/2023  LOWER EXTREMITY DOPPLER STUDY Patient Name:  Derek Thompson   Date of Exam:   06/14/2023 Medical Rec #: [id number]      Accession #:    [id number] Date of Birth: 02/03/1961      Patient Gender: M Patient Age:   63 years Exam Location:  [facility] Procedure:      VAS [place] ABI WITH/WO TBI Referring Phys: MICHAEL JEFFERY --------------------------------------------------------------------------------  Indications: Ulceration, and gangrene. High Risk Factors: Diabetes, past history of smoking.  Comparison Study: No prior exam. Performing Technologist: Fernande Bras  Examination Guidelines: A complete evaluation includes at minimum, Doppler waveform signals and systolic blood pressure reading at the level of bilateral brachial, anterior tibial, and posterior tibial arteries, when vessel segments are accessible. Bilateral testing is considered an integral part of a complete examination. Photoelectric Plethysmograph (PPG) waveforms and toe systolic pressure readings are included as required and additional duplex testing as needed. Limited examinations for reoccurring indications may be performed as noted.  ABI Findings: +---------+------------------+-----+----------+--------+ Right    Rt Pressure (mmHg)IndexWaveform  Comment  +---------+------------------+-----+----------+--------+ Brachial 110                    triphasic          +---------+------------------+-----+----------+--------+ PTA      111               1.01 monophasic         +---------+------------------+-----+----------+--------+ DP       118               1.07 biphasic           +---------+------------------+-----+----------+--------+ Great Toe203               1.85 Abnormal           +---------+------------------+-----+----------+--------+ +---------+------------------+-----+---------+-------+ Left     Lt Pressure (mmHg)IndexWaveform Comment +---------+------------------+-----+---------+-------+ Brachial 110  triphasic         +---------+------------------+-----+---------+-------+ PTA      116               1.05 triphasic        +---------+------------------+-----+---------+-------+ DP       109               0.99 biphasic         +---------+------------------+-----+---------+-------+ Great Toe71                0.65 Abnormal         +---------+------------------+-----+---------+-------+ +-------+-----------+-----------+------------+------------+ ABI/TBIToday's ABIToday's TBIPrevious ABIPrevious TBI +-------+-----------+-----------+------------+------------+ Right  1.07       1.85                                +-------+-----------+-----------+------------+------------+ Left   1.05       0.65                                +-------+-----------+-----------+------------+------------+  Summary: Right: Resting right ankle-brachial index is within normal range. The right toe-brachial index is abnormal. Left: Resting left ankle-brachial index is within normal range. The left toe-brachial index is abnormal. *See table(s) above for measurements and observations.  Electronically signed by Gerarda Fraction on 06/14/2023 at 4:06:40 PM.    Final    US RENAL Result Date: 06/14/2023 CLINICAL DATA:  [id number] Encephalopathy acute [id number] EXAM: RENAL / URINARY TRACT ULTRASOUND COMPLETE COMPARISON:  09/10/2020 FINDINGS: Right Kidney: Renal measurements: 11.8 x 4.9 x 4.8 cm = volume: 143 mL. Echogenicity within normal limits. No mass or hydronephrosis visualized. Left Kidney: Renal measurements: 10.6 x 4.4 x 4.3 cm = volume: 104 mL. Echogenicity within normal limits. No mass or hydronephrosis visualized. Bladder: Abnormally thickened appearance of the urinary bladder wall. Linear area of increased echogenicity anteriorly within the bladder with dirty posterior acoustic shadowing. Other: Small bilateral pleural effusions. IMPRESSION: 1. Abnormally thickened appearance of the urinary bladder wall. Linear area of increased  echogenicity anteriorly within the bladder with dirty posterior acoustic shadowing. This may represent air within the bladder lumen, which could be secondary to recent instrumentation or infection. Air within the bladder wall (emphysematous cystitis) is not excluded. Correlation with urinalysis is recommended. 2. Unremarkable sonographic appearance of the kidneys. 3. Small bilateral pleural effusions. Electronically Signed   By: Duanne Guess D.O.   On: 06/14/2023 13:40   ECHOCARDIOGRAM COMPLETE Result Date: 06/14/2023    ECHOCARDIOGRAM REPORT   Patient Name:   Derek Thompson Date of Exam: 06/14/2023 Medical Rec #:  [id number]     Height:       72.0 in Accession #:    [id number]    Weight:       170.9 lb Date of Birth:  1960/06/06     BSA:          1.993 m Patient Age:    62 years      BP:           100/66 mmHg Patient Gender: M             HR:           89 bpm. Exam Location:  Inpatient Procedure: 2D Echo, Cardiac Doppler, Color Doppler and Intracardiac            Opacification Agent (Both Spectral and Color Flow Doppler were  utilized during procedure). Indications:    Stroke, shock  History:        Patient has prior history of Echocardiogram examinations, most                 recent 08/19/2022. CHF, CAD; Risk Factors:Hypertension, Diabetes,                 Dyslipidemia and Former Smoker.  Sonographer:    Vern Claude Referring Phys: Lavone Neri OPYD IMPRESSIONS  1. Large 3 x 3 cm round heterogenous mass at the LV apex which is suggestive of thrombus given severe LV dysfunction and less likely myxoma - the mass did not take up Definity contrast (appears avascular). Left ventricular ejection fraction, by estimation, is 20 to 25%. Left ventricular ejection fraction by 2D MOD biplane is 21.2 %. The left ventricle has severely decreased function. The left ventricle demonstrates global hypokinesis. The left ventricular internal cavity size was mildly dilated. Left ventricular diastolic parameters are  consistent with Grade II diastolic dysfunction (pseudonormalization). Elevated left ventricular end-diastolic pressure.  2. Right ventricular systolic function is mildly reduced. The right ventricular size is normal. There is mildly elevated pulmonary artery systolic pressure. The estimated right ventricular systolic pressure is 39.0 mmHg.  3. The mitral valve is abnormal. Moderate mitral valve regurgitation.  4. The aortic valve is tricuspid. Aortic valve regurgitation is not visualized.  5. The inferior vena cava is dilated in size with <50% respiratory variability, suggesting right atrial pressure of 15 mmHg. Comparison(s): Changes from prior study are noted. 08/16/2022: LVEF 25%, global hypokinesis. Conclusion(s)/Recommendation(s): Critical findings reported to Dr. Francine Graven and Elmer Picker, NP and acknowledged at 12:23 pm on 06/14/23. FINDINGS  Left Ventricle: Large 3 x 3 cm round heterogenous mass at the LV apex which is suggestive of thrombus given severe LV dysfunction and less likely myxoma - the mass did not take up Definity contrast (appears avascular). Left ventricular ejection fraction, by estimation, is 20 to 25%. Left ventricular ejection fraction by 2D MOD biplane is 21.2 %. The left ventricle has severely decreased function. The left ventricle demonstrates global hypokinesis. Definity contrast agent was given IV to delineate the left ventricular endocardial borders. Strain imaging was not performed. The left ventricular internal cavity size was mildly dilated. There is no left ventricular hypertrophy. Left ventricular diastolic parameters are consistent with Grade II diastolic dysfunction (pseudonormalization). Elevated left ventricular end-diastolic pressure. Right Ventricle: The right ventricular size is normal. No increase in right ventricular wall thickness. Right ventricular systolic function is mildly reduced. There is mildly elevated pulmonary artery systolic pressure. The tricuspid regurgitant  velocity  is 2.45 m/s, and with an assumed right atrial pressure of 15 mmHg, the estimated right ventricular systolic pressure is 39.0 mmHg. Left Atrium: Left atrial size was normal in size. Right Atrium: Right atrial size was normal in size. Pericardium: There is no evidence of pericardial effusion. Mitral Valve: The mitral valve is abnormal. There is mild thickening of the anterior and posterior mitral valve leaflet(s). Moderate mitral valve regurgitation. MV peak gradient, 4.0 mmHg. The mean mitral valve gradient is 2.0 mmHg. Tricuspid Valve: The tricuspid valve is grossly normal. Tricuspid valve regurgitation is mild. Aortic Valve: The aortic valve is tricuspid. Aortic valve regurgitation is not visualized. Aortic valve mean gradient measures 2.0 mmHg. Aortic valve peak gradient measures 3.3 mmHg. Aortic valve area, by VTI measures 2.01 cm. Pulmonic Valve: The pulmonic valve was grossly normal. Pulmonic valve regurgitation is trivial. Aorta: The aortic root and ascending aorta  are structurally normal, with no evidence of dilitation. Venous: The inferior vena cava is dilated in size with less than 50% respiratory variability, suggesting right atrial pressure of 15 mmHg. IAS/Shunts: No atrial level shunt detected by color flow Doppler. Additional Comments: 3D imaging was not performed.  LEFT VENTRICLE PLAX 2D                        Biplane EF (MOD) LVIDd:         5.50 cm         LV Biplane EF:   Left LVIDs:         4.80 cm                          ventricular LV PW:         0.70 cm                          ejection LV IVS:        0.60 cm                          fraction by LVOT diam:     1.80 cm                          2D MOD LV SV:         29                               biplane is LV SV Index:   14                               21.2 %. LVOT Area:     2.54 cm                                Diastology                                LV e' medial:    5.98 cm/s LV Volumes (MOD)               LV E/e' medial:   16.1 LV vol d, MOD    226.1 ml      LV e' lateral:   9.03 cm/s A2C:                           LV E/e' lateral: 10.7 LV vol d, MOD    185.9 ml A4C: LV vol s, MOD    152.1 ml A2C: LV vol s, MOD    167.2 ml A4C: LV SV MOD A2C:   74.0 ml LV SV MOD A4C:   185.9 ml LV SV MOD BP:    47.0 ml RIGHT VENTRICLE            IVC RV Basal diam:  4.90 cm    IVC diam: 2.50 cm RV Mid diam:    3.50 cm RV S prime:     9.14 cm/s LEFT ATRIUM             Index  RIGHT ATRIUM           Index LA diam:        3.90 cm 1.96 cm/m   RA Area:     18.60 cm LA Vol (A2C):   70.7 ml 35.48 ml/m  RA Volume:   55.90 ml  28.05 ml/m LA Vol (A4C):   57.7 ml 28.96 ml/m LA Biplane Vol: 67.4 ml 33.82 ml/m  AORTIC VALVE                    PULMONIC VALVE AV Area (Vmax):    2.03 cm     PV Vmax:          0.79 m/s AV Area (Vmean):   1.54 cm     PV Peak grad:     2.5 mmHg AV Area (VTI):     2.01 cm     PR End Diast Vel: 8.12 msec AV Vmax:           90.60 cm/s AV Vmean:          68.900 cm/s AV VTI:            0.142 m AV Peak Grad:      3.3 mmHg AV Mean Grad:      2.0 mmHg LVOT Vmax:         72.40 cm/s LVOT Vmean:        41.800 cm/s LVOT VTI:          0.112 m LVOT/AV VTI ratio: 0.79  AORTA Ao Root diam: 3.00 cm Ao Asc diam:  2.50 cm MITRAL VALVE                 TRICUSPID VALVE MV Area (PHT): 5.20 cm      TR Peak grad:   24.0 mmHg MV Area VTI:   1.40 cm      TR Vmax:        245.00 cm/s MV Peak grad:  4.0 mmHg MV Mean grad:  2.0 mmHg      SHUNTS MV Vmax:       1.00 m/s      Systemic VTI:  0.11 m MV Vmean:      56.4 cm/s     Systemic Diam: 1.80 cm MV Decel Time: 146 msec MR Peak grad:   65.0 mmHg MR Mean grad:   42.5 mmHg MR Vmax:        403.00 cm/s MR Vmean:       307.5 cm/s MR PISA:        1.57 cm MR PISA Radius: 0.50 cm MV E velocity: 96.50 cm/s MV A velocity: 44.60 cm/s MV E/A ratio:  2.16 Zoila Shutter MD Electronically signed by Zoila Shutter MD Signature Date/Time: 06/14/2023/12:32:48 PM    Final     Cardiac Studies   Reviewed echo  Patient  Profile     63 y.o. male   Assessment & Plan    Septic shock Right toe/foot gangrene Subacute CVA CHF  LV thrombus  CAD  Hypertension    Off Levophed, septic shock has resolved. No still on  broad-spectrum antibiotics per the ICU team for vancomycin and cefepime.  For his infection.  Yesterday had episodes of tachyarrhythmia, his digoxin was restarted.  One-time dose of IV 0.125 mg of digoxin was given.  Resolved but overnight had some increased heart rate his beta-blocker has been increased.  Agree with this.  Will monitor his blood pressure as well.   In terms of  his cardiovascular conditions clinically he does not appear to be in heart failure.  Known significantly depressed ejection fraction giving back unfortunately cannot start patient back on his guideline directed medical therapy.   Agree with heparin drip in the setting of his left ventricular thrombus.  Thankfully no anginal symptom.  Procedures are planned for tomorrow.  The patient does not have any unstable cardiac conditions.  Upon evaluation today, he can achieve 4 METs or greater without anginal symptoms.  According to [facility] and AHA guidelines, he requires no further cardiac workup prior to his noncardiac surgery and should be at acceptable risk. Our service is available as necessary in the perioperative period.        For questions or updates, please contact [facility] Please consult [email or web address] for contact info under Cardiology/STEMI.      Signed, Thomasene Ripple, DO  06/16/2023, 7:58 AM

## 2023-06-17 DIAGNOSIS — I5022 Chronic systolic (congestive) heart failure: Secondary | ICD-10-CM | POA: Diagnosis not present

## 2023-06-17 LAB — GLUCOSE, CAPILLARY
Glucose-Capillary: 180 mg/dL — ABNORMAL HIGH (ref 70–99)
Glucose-Capillary: 176 mg/dL — ABNORMAL HIGH (ref 70–99)
Glucose-Capillary: 137 mg/dL — ABNORMAL HIGH (ref 70–99)
Glucose-Capillary: 120 mg/dL — ABNORMAL HIGH (ref 70–99)
Glucose-Capillary: 137 mg/dL — ABNORMAL HIGH (ref 70–99)
Glucose-Capillary: 163 mg/dL — ABNORMAL HIGH (ref 70–99)
Glucose-Capillary: 111 mg/dL — ABNORMAL HIGH (ref 70–99)

## 2023-06-17 LAB — BASIC METABOLIC PANEL
Anion gap: 6 (ref 5–15)
BUN: 20 mg/dL (ref 8–23)
CO2: 18 mmol/L — ABNORMAL LOW (ref 22–32)
Calcium: 7.7 mg/dL — ABNORMAL LOW (ref 8.9–10.3)
Chloride: 107 mmol/L (ref 98–111)
Creatinine, Ser: 1.36 mg/dL — ABNORMAL HIGH (ref 0.61–1.24)
GFR, Estimated: 58 mL/min — ABNORMAL LOW (ref >60)
Glucose, Bld: 149 mg/dL — ABNORMAL HIGH (ref 70–99)
Potassium: 4.5 mmol/L (ref 3.5–5.1)
Sodium: 131 mmol/L — ABNORMAL LOW (ref 135–145)

## 2023-06-17 LAB — CBC
HCT: 24.7 % — ABNORMAL LOW (ref 39.0–52.0)
Hemoglobin: 7.8 g/dL — ABNORMAL LOW (ref 13.0–17.0)
MCH: 27.3 pg (ref 26.0–34.0)
MCHC: 31.6 g/dL (ref 30.0–36.0)
MCV: 86.4 fL (ref 80.0–100.0)
Platelets: 122 10*3/uL — ABNORMAL LOW (ref 150–400)
RBC: 2.86 MIL/uL — ABNORMAL LOW (ref 4.22–5.81)
RDW: 17.5 % — ABNORMAL HIGH (ref 11.5–15.5)
WBC: 11.1 10*3/uL — ABNORMAL HIGH (ref 4.0–10.5)
nRBC: 0 % (ref 0.0–0.2)

## 2023-06-17 LAB — MAGNESIUM: Magnesium: 2 mg/dL (ref 1.7–2.4)

## 2023-06-17 LAB — PHOSPHORUS: Phosphorus: 2.8 mg/dL (ref 2.5–4.6)

## 2023-06-17 LAB — HEPARIN LEVEL (UNFRACTIONATED)
Heparin Unfractionated: 0.32 [IU]/mL (ref 0.30–0.70)
Heparin Unfractionated: 0.37 [IU]/mL (ref 0.30–0.70)

## 2023-06-17 NOTE — Progress Notes (Signed)
Patient ID: Derek Thompson, male   DOB: [birth year], 63 y.o.   MRN: [id number] Patient is status post endovascular evaluation for the right lower extremity.  Patient has microvascular disease.  With persistent gangrenous changes to the second toe will plan for a second ray amputation on Wednesday.

## 2023-06-17 NOTE — Progress Notes (Signed)
PT Cancellation Note  Patient Details Name: Derek Thompson MRN: [id number] DOB: 1960/08/19   Cancelled Treatment:    Reason Eval/Treat Not Completed: Medical issues which prohibited therapy  Evaluated by PT Wednesday, underwent aortogram 2/14. MAP 61 at this time. Will hold per dept guidelines with MAP <65. Spoke with RN. Plan to follow-up Monday for continued PT as tolerated.  Please secure chat myself of [facility] PT if more urgent visit needed.   Kathlyn Sacramento, PT, DPT [facility]  Rehabilitation Services Physical Therapist Office: [phone] Website: [email or web address]  Berton Mount 06/17/2023, 2:58 PM

## 2023-06-17 NOTE — TOC Progression Note (Signed)
Transition of Care [facility]) - Progression Note    Patient Details  Name: Derek Thompson MRN: [id number] Date of Birth: 1961/03/23  [facility] CM/SW Contact  Michaela Corner, [place] Phone Number: 06/17/2023, 10:38 AM  Clinical Narrative:   CSW spoke with pt about PT recs for STR. Pt is agreeable and gave CSW permission to fax out referrals. Pending [facility] at this time (requested clinicals have been uploaded to [facility]).   TOC will continue to follow.   Expected Discharge Plan: Skilled Nursing Facility Barriers to Discharge: Continued Medical Work up  Expected Discharge Plan and Services In-house Referral: Clinical Social Work     Living arrangements for the past 2 months: Hotel/Motel                                       Social Determinants of Health (SDOH) Interventions SDOH Screenings   Food Insecurity: No Food Insecurity (06/14/2023)  Housing: Low Risk  (06/14/2023)  Transportation Needs: No Transportation Needs (06/14/2023)  Utilities: Not At Risk (06/14/2023)  Alcohol Screen: Low Risk  (08/19/2022)  Depression (PHQ2-9): Medium Risk (11/22/2022)  Financial Resource Strain: Low Risk  (08/19/2022)  Tobacco Use: Medium Risk (06/14/2023)    Readmission Risk Interventions     No data to display

## 2023-06-17 NOTE — Plan of Care (Signed)
  Problem: Clinical Measurements: Goal: Diagnostic test results will improve Outcome: Progressing Goal: Respiratory complications will improve Outcome: Progressing Goal: Cardiovascular complication will be avoided Outcome: Progressing   Problem: Nutrition: Goal: Adequate nutrition will be maintained Outcome: Progressing   Problem: Coping: Goal: Level of anxiety will decrease Outcome: Progressing

## 2023-06-17 NOTE — [place] FL2 (Signed)
[place] MEDICAID FL2 LEVEL OF CARE FORM     IDENTIFICATION  Patient Name: Derek Thompson Birthdate: 1961-04-08 Sex: male Admission Date (Current Location): 06/13/2023  [facility] and [place] Number:  Producer, television/film/video and Address:  [facility] [place], [address], [place], [name] [address]      Provider Number: [id number]  Attending Physician Name and Address:  Martina Sinner, MD  Relative Name and Phone Number:       Current Level of Care: Hospital Recommended Level of Care: Skilled Nursing Facility Prior Approval Number:    Date Approved/Denied:   PASRR Number: Pending  Discharge Plan: SNF    Current Diagnoses: Patient Active Problem List   Diagnosis Date Noted   Chronic osteomyelitis of toe, right (HCC) 06/15/2023   Cutaneous abscess of right foot 06/15/2023   Generalized weakness 06/14/2023   Acute metabolic encephalopathy 06/14/2023   SIRS (systemic inflammatory response syndrome) (HCC) 06/14/2023   High anion gap metabolic acidosis 06/14/2023   Lactic acidosis 06/14/2023   AKI (acute kidney injury) (HCC) 06/14/2023   Acute on chronic anemia 06/14/2023   Hypomagnesemia 06/14/2023   Hyperglycemia 06/14/2023   Septic shock (HCC) 06/14/2023   Depressed left ventricular ejection fraction 06/14/2023   Hyperosmolar hyperglycemic state (HHS) (HCC) 06/13/2023   Hyperkalemia 11/22/2022   Iron deficiency anemia 08/27/2022   MDD (major depressive disorder) 08/27/2022   Healthcare maintenance 08/27/2022   Genetic testing 10/12/2020   Malignant neoplasm of prostate (HCC)    Family history of breast cancer    CAD (coronary artery disease) 06/10/2020   Hypertension 06/10/2020   Dyslipidemia 06/10/2020   DM2 (diabetes mellitus, type 2) (HCC) 06/10/2020   Chronic combined systolic and diastolic heart failure (HCC)    Pulmonary edema cardiac cause (HCC) 05/22/2020    Orientation RESPIRATION BLADDER Height & Weight     Self, Situation   Normal Incontinent, External catheter Weight: 170 lb 13.7 oz (77.5 kg) Height:  6' (182.9 cm)  BEHAVIORAL SYMPTOMS/MOOD NEUROLOGICAL BOWEL NUTRITION STATUS      Incontinent Diet (See dc summary)  AMBULATORY STATUS COMMUNICATION OF NEEDS Skin   Extensive Assist Verbally Other (Comment) (Wound / Incision - Non-pressure wound Toe; Anterior;Left)                       Personal Care Assistance Level of Assistance  Bathing, Feeding, Dressing Bathing Assistance: Maximum assistance Feeding assistance: Limited assistance Dressing Assistance: Maximum assistance     Functional Limitations Info  Sight, Hearing, Speech Sight Info: Impaired Financial trader) Hearing Info: Adequate Speech Info: Adequate    SPECIAL CARE FACTORS FREQUENCY  PT (By licensed PT), OT (By licensed OT), Speech therapy     PT Frequency: 5x week OT Frequency: 5x week     Speech Therapy Frequency: 5x week      Contractures Contractures Info: Not present    Additional Factors Info  Code Status, Insulin Sliding Scale Code Status Info: Full     Insulin Sliding Scale Info: See dc summary       Current Medications (06/17/2023):  This is the current hospital active medication list Current Facility-Administered Medications  Medication Dose Route Frequency Provider Last Rate Last Admin   acetaminophen (TYLENOL) tablet 650 mg  650 mg Oral Q6H PRN Howerter, Justin B, DO   650 mg at 06/16/23 3086   Or   acetaminophen (TYLENOL) suppository 650 mg  650 mg Rectal Q6H PRN Howerter, Justin B, DO  aspirin EC tablet 81 mg  81 mg Oral Daily Howerter, Justin B, DO   81 mg at 06/17/23 0950   atorvastatin (LIPITOR) tablet 80 mg  80 mg Oral Daily Patrici Ranks, MD   80 mg at 06/17/23 0950   ceFEPIme (MAXIPIME) 2 g in sodium chloride 0.9 % 100 mL IVPB  2 g Intravenous Q8H Martina Sinner, MD 200 mL/hr at 06/17/23 0954 2 g at 06/17/23 0954   Chlorhexidine Gluconate Cloth 2 % PADS 6 each  6 each Topical Daily Martina Sinner, MD   6 each at 06/16/23 0953   digoxin (LANOXIN) tablet 0.125 mg  0.125 mg Oral Daily Tawkaliyar, Roya, DO   0.125 mg at 06/17/23 0950   heparin ADULT infusion 100 units/mL (25000 units/283mL)  1,900 Units/hr Intravenous Continuous Titus Mould, RPH 19 mL/hr at 06/17/23 0800 1,900 Units/hr at 06/17/23 0800   insulin aspart (novoLOG) injection 0-9 Units  0-9 Units Subcutaneous Q4H Briscoe Deutscher, MD   1 Units at 06/17/23 0755   insulin glargine-yfgn (SEMGLEE) injection 12 Units  12 Units Subcutaneous Daily Opyd, Lavone Neri, MD   12 Units at 06/17/23 0950   metoprolol tartrate (LOPRESSOR) tablet 25 mg  25 mg Oral BID Paliwal, Eliezer Lofts, MD   25 mg at 06/16/23 0952   metroNIDAZOLE (FLAGYL) tablet 500 mg  500 mg Oral Q12H Tawkaliyar, Roya, DO   500 mg at 06/17/23 0950   ondansetron (ZOFRAN) injection 4 mg  4 mg Intravenous Q6H PRN Howerter, Justin B, DO       Oral care mouth rinse  15 mL Mouth Rinse PRN Lorin Glass, MD       pantoprazole (PROTONIX) EC tablet 40 mg  40 mg Oral Daily Tawkaliyar, Roya, DO   40 mg at 06/17/23 0950   sodium chloride flush (NS) 0.9 % injection 10-40 mL  10-40 mL Intracatheter Q12H Melody Comas B, MD   10 mL at 06/17/23 1002   sodium chloride flush (NS) 0.9 % injection 10-40 mL  10-40 mL Intracatheter PRN Martina Sinner, MD       vancomycin (VANCOREADY) IVPB 1500 mg/300 mL  1,500 mg Intravenous Q24H Martina Sinner, MD   Stopped at 06/17/23 0006     Discharge Medications: Please see discharge summary for a list of discharge medications.  Relevant Imaging Results:  Relevant Lab Results:   Additional Information SSN [id number] [place]

## 2023-06-17 NOTE — Progress Notes (Signed)
  Progress Note    06/17/2023 9:35 AM 1 Day Post-Op  Subjective:  no complaints this morning   Vitals:   06/17/23 0751 06/17/23 0800  BP:  90/74  Pulse:  (!) 101  Resp:  (!) 28  Temp: 99 F (37.2 C)   SpO2:  96%   Physical Exam: Lungs:  non labored Extremities:  R 2nd toe gangrene; malodorous, left groin site without hematoma Neurologic: A&O  CBC    Component Value Date/Time   WBC 11.1 (H) 06/17/2023 0200   RBC 2.86 (L) 06/17/2023 0200   HGB 7.8 (L) 06/17/2023 0200   HCT 24.7 (L) 06/17/2023 0200   PLT 122 (L) 06/17/2023 0200   MCV 86.4 06/17/2023 0200   MCH 27.3 06/17/2023 0200   MCHC 31.6 06/17/2023 0200   RDW 17.5 (H) 06/17/2023 0200   LYMPHSABS 0.6 (L) 06/13/2023 1940   MONOABS 0.5 06/13/2023 1940   EOSABS 0.0 06/13/2023 1940   BASOSABS 0.0 06/13/2023 1940    BMET    Component Value Date/Time   NA 131 (L) 06/17/2023 0654   NA 135 11/22/2022 1445   K 4.5 06/17/2023 0654   CL 107 06/17/2023 0654   CO2 18 (L) 06/17/2023 0654   GLUCOSE 149 (H) 06/17/2023 0654   BUN 20 06/17/2023 0654   BUN 24 11/22/2022 1445   CREATININE 1.36 (H) 06/17/2023 0654   CALCIUM 7.7 (L) 06/17/2023 0654   GFRNONAA 58 (L) 06/17/2023 0654   GFRAA 89 06/10/2020 1645    INR    Component Value Date/Time   INR 1.7 (H) 06/14/2023 0452     Intake/Output Summary (Last 24 hours) at 06/17/2023 0935 Last data filed at 06/17/2023 0800 Gross per 24 hour  Intake 1817.15 ml  Output 690 ml  Net 1127.15 ml     Assessment/Plan:  63 y.o. male diabetic right second toe infection.  Angiogram yesterday demonstrated inline flow to the foot without significant stenosis.  Okay to proceed with toe amputation when appropriate. Please call back vascular surgery with any further questions or concerns.  Daria Pastures MD [facility] Phone Number: [phone] 06/17/2023 9:35 AM

## 2023-06-17 NOTE — Progress Notes (Signed)
PHARMACY - ANTICOAGULATION CONSULT NOTE  Pharmacy Consult for IV heparin Indication: LV thrombus + thromboembolic vs ischemic stroke   No Known Allergies  Patient Measurements: Height: 6' (182.9 cm) Weight: 77.5 kg (170 lb 13.7 oz) IBW/kg (Calculated) : 77.6 Heparin Dosing Weight: Actual body weight  Vital Signs: Temp: 98.9 F (37.2 C) (02/14 2319) Temp Source: Oral (02/14 2319) BP: 100/63 (02/15 0130) Pulse Rate: 90 (02/15 0130)  Labs: Recent Labs    06/14/23 0452 06/14/23 1255 06/14/23 1405 06/14/23 2004 06/15/23 0414 06/15/23 1018 06/15/23 1844 06/16/23 0404 06/17/23 0200  HGB  --   --   --   --  8.1*  --   --  8.4*  --   HCT  --   --   --   --  25.1*  --   --  26.1*  --   PLT  --   --   --   --  127*  --   --  118*  --   APTT 32  --   --   --   --   --   --   --   --   LABPROT 20.2*  --   --   --   --   --   --   --   --   INR 1.7*  --   --   --   --   --   --   --   --   HEPARINUNFRC  --   --   --    < >  --    < > 0.19* 0.27* 0.32  CREATININE 1.84* 1.51*  --   --  1.25*  --   --  1.26*  --   TROPONINIHS  --   --  66*  --   --   --   --   --   --    < > = values in this interval not displayed.    Estimated Creatinine Clearance: 65.8 mL/min (A) (by C-G formula based on SCr of 1.26 mg/dL (H)).  Assessment: Derek Thompson is a 63 y.o M w/ PMHx of poorly controlled T2DM, HFrEF (LVEF 25%), LBBB, G2DD and LV global hypokinesis, HTN, CAD, Prostate Cancer treated w/ radiation 2022 who presented to ED for 1 week of progressive generalized weakness, 2 days AMS, admitted for suspected HHS. 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 Note patient also with acute/subacute infarct in R frontal lobe, with suspicion for embolic etiology.  Heparin level 0.32 (therapeutic) this on 1900 units/hr after restart s/p vascular procedure. No signs/symptoms of bleeding or issues with the heparin infusion noted.  Goal of Therapy:   Heparin level 0.3-0.5 units/ml  (targeting low end for concominant CVA/ w/ petechial hemorrhage) Monitor platelets by anticoagulation protocol: Yes   Plan:  Continue heparin infusion at 1900 units/hr  8 hr confirmatory heparin level CBC and heparin level daily  Thank you for allowing pharmacy to participate in this patient's care,  Arabella Merles, PharmD. Clinical Pharmacist 06/17/2023 2:35 AM

## 2023-06-17 NOTE — Progress Notes (Signed)
Rounding Note    Patient Name: Derek Thompson Date of Encounter: 06/17/2023  [facility] Cardiologist: [name], MD   Subjective   No complaints  Inpatient Medications    Scheduled Meds:  aspirin EC  81 mg Oral Daily   atorvastatin  80 mg Oral Daily   Chlorhexidine Gluconate Cloth  6 each Topical Daily   digoxin  0.125 mg Oral Daily   insulin aspart  0-9 Units Subcutaneous Q4H   insulin glargine-yfgn  12 Units Subcutaneous Daily   metoprolol tartrate  25 mg Oral BID   metroNIDAZOLE  500 mg Oral Q12H   pantoprazole  40 mg Oral Daily   sodium chloride flush  10-40 mL Intracatheter Q12H   Continuous Infusions:  ceFEPime (MAXIPIME) IV Stopped (06/17/23 1025)   heparin 1,900 Units/hr (06/17/23 1300)   vancomycin Stopped (06/17/23 0006)   PRN Meds: acetaminophen **OR** acetaminophen, ondansetron (ZOFRAN) IV, mouth rinse, sodium chloride flush   Vital Signs    Vitals:   06/17/23 1200 06/17/23 1230 06/17/23 1300 06/17/23 1330  BP: (!) 98/55 97/64 118/81 (!) 91/51  Pulse: 88 89 89 88  Resp: 19 (!) 26 19 17   Temp:      TempSrc:      SpO2: 98% 93% 98% 100%  Weight:      Height:        Intake/Output Summary (Last 24 hours) at 06/17/2023 1407 Last data filed at 06/17/2023 1300 Gross per 24 hour  Intake 1673.42 ml  Output 1290 ml  Net 383.42 ml      06/16/2023    5:00 AM 06/15/2023    5:00 AM 06/14/2023    5:20 AM  Last 3 Weights  Weight (lbs) 170 lb 13.7 oz 170 lb 13.7 oz 170 lb 13.7 oz  Weight (kg) 77.5 kg 77.5 kg 77.5 kg      Telemetry    SR - Personally Reviewed  ECG    N/a - Personally Reviewed  Physical Exam   GEN: No acute distress.   Neck: No JVD Cardiac: RRR, no murmurs, rubs, or gallops.  Respiratory: Clear to auscultation bilaterally. GI: Soft, nontender, non-distended  MS: No edema; No deformity. Neuro:  Nonfocal  Psych: Normal affect   Labs    High Sensitivity Troponin:   Recent Labs  Lab 06/14/23 1405   TROPONINIHS 66*     Chemistry Recent Labs  Lab 06/13/23 1939 06/13/23 1958 06/14/23 0452 06/14/23 1255 06/15/23 0414 06/16/23 0404 06/17/23 0654  NA 126*   < > 130*   < > 135 131* 131*  K 4.1   < > 3.9   < > 3.4* 4.5 4.5  CL 91*   < > 99   < > 105 104 107  CO2 19*   < > 13*   < > 20* 19* 18*  GLUCOSE 545*   < > 195*   < > 162* 176* 149*  BUN 19   < > 20   < > 17 16 20   CREATININE 1.98*   < > 1.84*   < > 1.25* 1.26* 1.36*  CALCIUM 8.5*   < > 8.0*   < > 7.7* 7.6* 7.7*  MG  --    < > 2.3  --  2.1 1.7 2.0  PROT 6.7  --  6.0*  --   --   --   --   ALBUMIN 2.1*  --  1.8*  --   --   --   --  AST 29  --  26  --   --   --   --   ALT 27  --  22  --   --   --   --   ALKPHOS 49  --  41  --   --   --   --   BILITOT 0.7  --  0.6  --   --   --   --   GFRNONAA 37*   < > 41*   < > >60 >60 58*  ANIONGAP 16*   < > 18*   < > 10 8 6    < > = values in this interval not displayed.    Lipids  Recent Labs  Lab 06/15/23 0414  CHOL 85  TRIG 131  HDL 10*  LDLCALC 49  CHOLHDL 8.5    Hematology Recent Labs  Lab 06/15/23 0414 06/16/23 0404 06/17/23 0200  WBC 15.2* 12.6* 11.1*  RBC 2.94* 3.06* 2.86*  HGB 8.1* 8.4* 7.8*  HCT 25.1* 26.1* 24.7*  MCV 85.4 85.3 86.4  MCH 27.6 27.5 27.3  MCHC 32.3 32.2 31.6  RDW 17.0* 17.3* 17.5*  PLT 127* 118* 122*   Thyroid  Recent Labs  Lab 06/13/23 1939 06/15/23 0414  TSH 5.642*  --   FREET4  --  1.38*    BNP Recent Labs  Lab 06/13/23 1937  BNP >4,500.0*    DDimer No results for input(s): "DDIMER" in the last 168 hours.   Radiology    PERIPHERAL VASCULAR CATHETERIZATION Result Date: 06/16/2023 Table formatting from the original result was not included. DATE OF SERVICE: 06/16/2023  PATIENT:  Derek Thompson  63 y.o. male  PRE-OPERATIVE DIAGNOSIS:  Atherosclerosis of native arteries of right lower extremity causing ulceration; diabetic foot infection  POST-OPERATIVE DIAGNOSIS:  Same  PROCEDURE:  1) Ultrasound guided left common femoral artery  access 2) Aortogram 3) Right lower extremity angiogram with second order cannulation  SURGEON:  Rande Brunt. Lenell Antu, MD  ASSISTANT: none  ANESTHESIA:   local  ESTIMATED BLOOD LOSS: minimal  LOCAL MEDICATIONS USED:  LIDOCAINE  COUNTS: confirmed correct.  PATIENT DISPOSITION:  PACU - hemodynamically stable.  Delay start of Pharmacological VTE agent (>24hrs) due to surgical blood loss or risk of bleeding: no  INDICATION FOR PROCEDURE: Derek Thompson is a 63 y.o. male with right great toe ulceration and diabetic foot infection. After careful discussion of risks, benefits, and alternatives the patient was offered angiogram. The patient understood and wished to proceed.  OPERATIVE FINDINGS:  Left renal artery: patent Right renal artery: patent Infrarenal aorta: patent Left common iliac artery: patent Right common iliac artery: patent Left internal iliac artery: patent Right internal iliac artery: patent Left external iliac artery: patent Right external iliac artery: patent Left common femoral artery: not studied Right common femoral artery: patent Left profunda femoris artery: not studied Right profunda femoris artery: patent Left superficial femoral artery: not studied Right superficial femoral artery: patent; mild <50% stenosis in mid section Left popliteal artery: not studied Right popliteal artery: patent Left anterior tibial artery: not studied Right anterior tibial artery: patent Left tibioperoneal trunk: not studied Right tibioperoneal trunk: patent Left peroneal artery: not studied Right peroneal artery: patent Left posterior tibial artery: not studied Right posterior tibial artery: patent Left pedal circulation: not studied Right pedal circulation: disadvantaged  GLASS score. N/A.  WIfI score. 2 / 0 / 2. Stage III.  DESCRIPTION OF PROCEDURE: After identification of the patient in the pre-operative holding area, the patient was  transferred to the operating room. The patient was positioned supine on the operating room  table. The groins was prepped and draped in standard fashion. A surgical pause was performed confirming correct patient, procedure, and operative location.  The left groin was anesthetized with subcutaneous injection of 1% lidocaine. Using ultrasound guidance, the left common femoral artery was accessed with micropuncture technique.  Fluoroscopy was used to confirm cannulation over the femoral head. The 39F micropuncture sheath was upsized to 63F.  A Benson wire was advanced into the distal aorta. Over the wire an omni flush catheter was advanced to the level of L2. Aortogram was performed - see above for details.  The right common iliac artery was selected with an omniflush catheter and benson guidewire. The wire was advanced into the common femoral artery. Over the wire the omni flush catheter was advanced into the external iliac artery. Selective angiography was performed - see above for details.  A mynx was used to close the arteriotomy. Hemostasis was excellent upon completion.  Upon completion of the case instrument and sharps counts were confirmed correct. The patient was transferred to the PACU in good condition. I was present for all portions of the procedure.  PLAN: small vessel disease in foot is likely culprit for foot infection. No opportunities for reducing major amputation risk.  Rande Brunt. Lenell Antu, MD FACS [facility] Phone Number: [phone] 06/16/2023 2:13 PM    Cardiac Studies     Patient Profile     Derek Thompson is a 63 y.o. 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 Francine Graven.   Assessment & Plan    1.Chronic HFrEF - 08/2022 echo: LVEF 25% - 10/2022 cath: nonobstrutive CAD, CI 2.87, PCWP 15, PA mean 19 - 06/2023 echo: LVEF 20-25%, grade II dd,  mild RV dysfunction, mod MR  -medical therapy limited due to low bp's this admission, septic shock this admissoin - he is on lopressor 25mg  bid. With soft bp's stay on short acting beta blocker. Other home meds on hold - has not appeared volume overloaded.   2. LV apical thrombus - noted on echo this admissoin - he is on hep gtt  3. Diabetic right second toe infection - plans are for amputatoin    Will follow patient peripherally tomorrow  For questions or updates, please contact  [facility] Please consult [email or web address] for contact info under        Signed, Dina Rich, MD  06/17/2023, 2:07 PM

## 2023-06-17 NOTE — Progress Notes (Signed)
 Please be advised that the above-named patient will require a short-term nursing home stay-anticipated 30 days or less for rehabilitation and strengthening. The plan is for return home.

## 2023-06-17 NOTE — Progress Notes (Signed)
PHARMACY - ANTICOAGULATION CONSULT NOTE  Pharmacy Consult for IV heparin Indication: LV thrombus + thromboembolic vs ischemic stroke   No Known Allergies  Patient Measurements: Height: 6' (182.9 cm) Weight: 77.5 kg (170 lb 13.7 oz) IBW/kg (Calculated) : 77.6 Heparin Dosing Weight: Actual body weight  Vital Signs: Temp: 99 F (37.2 C) (02/15 0751) Temp Source: Oral (02/15 0751) BP: 95/60 (02/15 1100) Pulse Rate: 95 (02/15 1100)  Labs: Recent Labs    06/14/23 1405 06/14/23 2004 06/15/23 0414 06/15/23 1018 06/16/23 0404 06/17/23 0200 06/17/23 0654 06/17/23 1002  HGB  --    < > 8.1*  --  8.4* 7.8*  --   --   HCT  --   --  25.1*  --  26.1* 24.7*  --   --   PLT  --   --  127*  --  118* 122*  --   --   HEPARINUNFRC  --    < >  --    < > 0.27* 0.32  --  0.37  CREATININE  --   --  1.25*  --  1.26*  --  1.36*  --   TROPONINIHS 66*  --   --   --   --   --   --   --    < > = values in this interval not displayed.    Estimated Creatinine Clearance: 60.9 mL/min (A) (by C-G formula based on SCr of 1.36 mg/dL (H)).  Assessment: Derek Thompson is a 63 y.o M w/ PMHx of poorly controlled T2DM, HFrEF (LVEF 25%), LBBB, G2DD and LV global hypokinesis, HTN, CAD, Prostate Cancer treated w/ radiation 2022 who presented to ED for 1 week of progressive generalized weakness, 2 days AMS, admitted for suspected HHS. 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 Note patient also with acute/subacute infarct in R frontal lobe, with suspicion for embolic etiology.  Confirmatory heparin level 0.37 after vascular procedure. No signs/symptoms of bleeding or issues with the heparin infusion noted.  Goal of Therapy:  Heparin level 0.3-0.5 units/ml  (targeting low end for concominant CVA/ w/ petechial hemorrhage) Monitor platelets by anticoagulation protocol: Yes   Plan:  Continue heparin infusion at 1900 units/hr  Daily and PRN heparin  level Monitor daily CBC, s/sx bleeding F/u long term plan  Thank you for allowing pharmacy to participate in this patient's care,  Rutherford Nail, PharmD PGY2 Critical Care Pharmacy Resident 06/17/2023 11:06 AM

## 2023-06-17 NOTE — H&P (View-Only) (Signed)
Patient ID: Derek Thompson, male   DOB: [birth year], 63 y.o.   MRN: [id number] Patient is status post endovascular evaluation for the right lower extremity.  Patient has microvascular disease.  With persistent gangrenous changes to the second toe will plan for a second ray amputation on Wednesday.

## 2023-06-17 NOTE — Progress Notes (Signed)
NAMEDenym Rahimi, MRN:  [id number], DOB:  09-10-1960, LOS: 4 ADMISSION DATE:  06/13/2023, CONSULTATION DATE:  06/14/2023 REFERRING MD:  Briscoe Deutscher , CHIEF COMPLAINT:  Shock    History of Present Illness:  Derek Thompson is a 63 y.o M w/ PMHx of poorly controlled T2DM, HFrEF (LVEF 25%), LBBB, G2DD and LV global hypokinesis, HTN, CAD, Prostate Cancer treated w/ radiation 2022 who presented to ED for 1 week of progressive generalized weakness, 2 days AMS, admitted for suspected HHS.    Initial labs showed CBG 548, beta-hydroxybutyrate acid 1.04. BNP >4,500, WBC 21.8 w/ Abs Neut 20.5, Hgb A1c 13.3, lactic acid 2.6, Cr 1.98, Serum osmolality 302. VBG: 7.48/27.9/21.1.    Rapid Response 2/12 called for symptomatic hypotension. Started to Levophed gtt with fluids. PCCM consulted.   Pertinent  Medical History  Poorly controlled DM-2 (HbA1c 13.3%), CAD, CHF (systolic and diastolic, EF 25%, G2DD), anemia of chronic illness illness, HTN   Significant Hospital Events: Including procedures, antibiotic start and stop dates in addition to other pertinent events   2/12: Given Rocephin, Zithromax, 2.6 L (NS 0.9% and LR), insulin drip, started on Levophed for hypotension, stopped later in the day.  2/12: R foot xray showed osteomyelitis great toe and 2nd toe, orthopedics consulted. Vancomycin started with addition of Cefepime and Flagyl  2/12: ECHO showed large LV apical thrombus, cardiology consulted. IV heparin.  2/12: ABIs abnormal, VSS consulted.  2/13: Sustained wide QRS tachycardia HR 150s. Given IV digoxin x 2, Po digoxin and po metoprolol. HR stable 90s. Restarted home medication Digoxin 0.125 mg daily  2/14: RLE Angiogram by [facility].   Interim History / Subjective:   No acute events overnight No SVT episodes today   Objective   Blood pressure (!) 100/57, pulse 94, temperature 98.4 F (36.9 C), temperature source Oral, resp. rate 13, height 6' (1.829 m), weight 77.5 kg, SpO2 99%.         Intake/Output Summary (Last 24 hours) at 06/17/2023 0740 Last data filed at 06/17/2023 0141 Gross per 24 hour  Intake 1725.14 ml  Output 690 ml  Net 1035.14 ml   Filed Weights   06/14/23 0520 06/15/23 0500 06/16/23 0500  Weight: 77.5 kg 77.5 kg 77.5 kg    Examination: General: Appears acutely ill, but no acute distress, on room air.    HENT: Atraumatic and normocephalic  Lungs: clear to auscultation bilaterally, no wheezing and crackles.  Cardiovascular: NSR, normal rate.  Abdomen: soft, NT, ND, bowel sounds present  Extremities: 2+ pitting edema bilateral LE, R foot with wet gangrenous 2nd toe  Neuro: AO x 4  GU: not assessed   Pertinent Imaging:   CTH: probably acute or subacute infarcts in the posterior right frontal lobe. Possible slight petechial hemorrhage without mass occupying acute Hemorrhage   MR brain: Acute or early subacute confluent infarct in the right frontal lobe. Additional smaller acute infarcts in the right cerebellum, left frontal, parietal and occipital lobes. Given involvement of multiple vascular territories, consider an embolic etiology.   CXR: Cardiomegaly with interstitial coarsening which may be due to edema or atypical infection.   R foot X ray showed osseous destruction involving the tuft of the distal phalanx of the great toe, as well as in the second toe involving both the distal and mid phalanges centered around the DIP joint. Overlying soft tissues are irregular with probable soft tissue gas   ABIs showed Abnormal R and L toe-brachial index    ECHO 2/12: Large  3 x 3 cm round heterogenous mass at the LV apex which is suggestive of thrombus given severe LV dysfunction. EF 20-25%, global hypokinesis, grade II diastolic dysfunction, mildly reduced RV function, mildly elevated pulmonary artery pressure, elevated LVEDP, moderate MR.   Resolved Hospital Problem list   Shock  Assessment & Plan:  R foot wet gangrene, suspected osteomyelitis   Presented with 1 week of generalized weakness, 2 days of AMS, hypotensive admitted for suspected HHS. Lactic acid 2.6 >>2.3>>8.7. Remained hypotensive, did not respond to fluid resuscitation. Started on levophed 2/12, stopped same day as patient is able keep MAP > 60. R foot x ray concerning for probable osteomyelitis. ABIs showed Abnormal R and L toe-brachial index.  Spiked a temperature 101.2 AM, is on appropriate Abx coverage with plans for procedures today.    - S/p RLE angiogram by [facility] today 2/14, findings of small vessel disease with no opportunities to reduce major amputation risk.  - R great toe and 2nd toe amputation by orthopedics planned for Wednesday - Continue IV Vancomycin, IV cefepime and PO flagyl 500 BID    Acute to early Subacute CVAs  MR brain findings multiple infarcts concerning for embolic etiology. Neurology on board. Per exam, no acute focal deficits. AO x 4, no dysarthria. ECHO findings concerning for large 3 x 3 cm mass at the LV apex suggestive for thrombus. Cardiology consulted.  - Start IV heparin post RLE angiogram   - If any acute neurological change, get STAT CT head  - Continue ASA and Statin - Will plan for CTA head and neck as Cr tolerates    - PT/OT/SLP  - NIHSS    CHF (systolic and diastolic), EF 20-25% LV apical thrombus 06/14/2023 Hx CAD HTN Wide-QRS tachycardia ECHO EF 20-25%, global hypokinesis, grade II diastolic dysfunction, dilated IVC with 3x3 cm mass at LV apex suggestive of thrombus. EKG 2/13 showed HR 156, wide QRS 140 ms and QTC 541 ms. LBBB. Given PO & IV digoxin 0.125 mg x2 and metoprolol 12.5 mg, HR stabilized. Overnight, tachycardiac episode, increased metoprolol 25 mg daily.    - Digoxin 0.125 mg daily - Metoprolol 25 mg BID  - ASA and Statin - Hold home GDMT (spiro 25 mg daily, Entresto 97-103 mg BID) in the setting of hypotension    HHS Poorly controlled DM-2 (HbA1c 13.3%), not on insulin  AGMA  Lactic Acidosis Presented with  CBG 548, beta-hydroxybutyrate acid 1.04, and  Serum osmolality 302. Initially started on endotool/IV insulin in the ED, transitioned to SSI and semglee 12 units daily.  - Will continue to follow up on daily BMETs - Glycemic control  - Semglee 12 units daily and SSI   AKI  Cr 1.36, baseline around 0.99. Creatinine is improving.  - continue to trend Cr   Normocytic Anemia  Thrombocytopenia Presented with Hgb 8.9, baseline ~10-11. Labs showed Iron 22, TIBC 161, saturation 14. Ferritin 264. Vit B12 908. IDA can be likely due to the current infectious/inflammatory process. Today, Hgb 8.4 and PLT 118 - Consider Iron repletion when sepsis resolves.    Elevated TSH and T4 TSH 5.6 and T4 1.38, does not have a history of thyroid disease. Likely elevated in the setting of acute processes as above. - Recommend recheck when patient is overall stable in the outpatient setting    Best Practice (right click and "Reselect all SmartList Selections" daily)   Diet/type: Heart Healthy  DVT prophylaxis systemic heparin Pressure ulcer(s): assess by RN GI prophylaxis: PPI Lines:  Central line Foley:  N/A Code Status:  full code Last date of multidisciplinary goals of care discussion [--]  Labs   CBC: Recent Labs  Lab 06/13/23 1940 06/13/23 1958 06/13/23 1959 06/15/23 0414 06/16/23 0404 06/17/23 0200  WBC 21.8*  --   --  15.2* 12.6* 11.1*  NEUTROABS 20.5*  --   --   --   --   --   HGB 8.9* 10.2* 10.2* 8.1* 8.4* 7.8*  HCT 27.9* 30.0* 30.0* 25.1* 26.1* 24.7*  MCV 87.5  --   --  85.4 85.3 86.4  PLT 195  --   --  127* 118* 122*    Basic Metabolic Panel: Recent Labs  Lab 06/13/23 2228 06/13/23 2228 06/14/23 0452 06/14/23 1255 06/14/23 2004 06/15/23 0414 06/16/23 0404 06/16/23 2031 06/17/23 0654  NA 127*  --  130* 130*  --  135 131*  --  131*  K 3.8  --  3.9 4.0  --  3.4* 4.5  --  4.5  CL 97*  --  99 102  --  105 104  --  107  CO2 19*  --  13* 19*  --  20* 19*  --  18*  GLUCOSE 479*   --  195* 229*  --  162* 176*  --  149*  BUN 20  --  20 20  --  17 16  --  20  CREATININE 1.80*  --  1.84* 1.51*  --  1.25* 1.26*  --  1.36*  CALCIUM 7.7*  --  8.0* 7.7*  --  7.7* 7.6*  --  7.7*  MG 1.5*  --  2.3  --   --  2.1 1.7  --  2.0  PHOS  --    < > 1.6*  --  2.3* 2.8 1.6* 3.5 2.8   < > = values in this interval not displayed.   GFR: Estimated Creatinine Clearance: 60.9 mL/min (A) (by C-G formula based on SCr of 1.36 mg/dL (H)). Recent Labs  Lab 06/13/23 1940 06/13/23 1959 06/13/23 2232 06/14/23 0452 06/15/23 0414 06/16/23 0404 06/17/23 0200  PROCALCITON  --   --   --  23.25  --   --   --   WBC 21.8*  --   --   --  15.2* 12.6* 11.1*  LATICACIDVEN  --  2.6* 2.3* 8.7*  --   --   --     Liver Function Tests: Recent Labs  Lab 06/13/23 1939 06/14/23 0452  AST 29 26  ALT 27 22  ALKPHOS 49 41  BILITOT 0.7 0.6  PROT 6.7 6.0*  ALBUMIN 2.1* 1.8*   Recent Labs  Lab 06/13/23 1939  LIPASE 18   Recent Labs  Lab 06/13/23 2031  AMMONIA 23    ABG    Component Value Date/Time   PHART 7.392 10/14/2022 0804   PCO2ART 33.8 10/14/2022 0804   PO2ART 96 10/14/2022 0804   HCO3 21.1 06/13/2023 1958   TCO2 20 (L) 06/13/2023 1959   ACIDBASEDEF 2.0 06/13/2023 1958   O2SAT 66.2 06/14/2023 1250     Coagulation Profile: Recent Labs  Lab 06/14/23 0452  INR 1.7*    Cardiac Enzymes: No results for input(s): "CKTOTAL", "CKMB", "CKMBINDEX", "TROPONINI" in the last 168 hours.  HbA1C: Hemoglobin A1C  Date/Time Value Ref Range Status  11/22/2022 01:32 PM 11.4 (A) 4.0 - 5.6 % Final   Hgb A1c MFr Bld  Date/Time Value Ref Range Status  06/13/2023 07:40 PM 13.3 (H) 4.8 -  5.6 % Final    Comment:    (NOTE) Pre diabetes:          5.7%-6.4%  Diabetes:              >6.4%  Glycemic control for   <7.0% adults with diabetes   08/18/2022 06:09 PM 13.6 (H) 4.8 - 5.6 % Final    Comment:    (NOTE) Pre diabetes:          5.7%-6.4%  Diabetes:              >6.4%  Glycemic  control for   <7.0% adults with diabetes     CBG: Recent Labs  Lab 06/16/23 1919 06/16/23 2319 06/17/23 0257 06/17/23 0516 06/17/23 0714  GLUCAP 168* 190* 180* 176* 137*      Critical care time:     Melody Comas, MD [facility] Office: [phone]   See Amion for personal pager PCCM on call pager [phone] until 7pm. Please call Elink 7p-7a. [phone]

## 2023-06-17 NOTE — Plan of Care (Signed)
   Problem: Education: Goal: Knowledge of General Education information will improve Description: Including pain rating scale, medication(s)/side effects and non-pharmacologic comfort measures Outcome: Not Progressing

## 2023-06-18 DIAGNOSIS — I5022 Chronic systolic (congestive) heart failure: Secondary | ICD-10-CM

## 2023-06-18 DIAGNOSIS — E1100 Type 2 diabetes mellitus with hyperosmolarity without nonketotic hyperglycemic-hyperosmolar coma (NKHHC): Secondary | ICD-10-CM | POA: Diagnosis not present

## 2023-06-18 DIAGNOSIS — E1152 Type 2 diabetes mellitus with diabetic peripheral angiopathy with gangrene: Secondary | ICD-10-CM

## 2023-06-18 DIAGNOSIS — D649 Anemia, unspecified: Secondary | ICD-10-CM | POA: Diagnosis not present

## 2023-06-18 DIAGNOSIS — R6510 Systemic inflammatory response syndrome (SIRS) of non-infectious origin without acute organ dysfunction: Secondary | ICD-10-CM | POA: Diagnosis not present

## 2023-06-18 DIAGNOSIS — M86671 Other chronic osteomyelitis, right ankle and foot: Secondary | ICD-10-CM

## 2023-06-18 DIAGNOSIS — G9341 Metabolic encephalopathy: Secondary | ICD-10-CM | POA: Diagnosis not present

## 2023-06-18 LAB — VANCOMYCIN, TROUGH: Vancomycin Tr: 22 ug/mL (ref 15–20)

## 2023-06-18 LAB — CBC
HCT: 24.1 % — ABNORMAL LOW (ref 39.0–52.0)
Hemoglobin: 7.8 g/dL — ABNORMAL LOW (ref 13.0–17.0)
MCH: 27.9 pg (ref 26.0–34.0)
MCHC: 32.4 g/dL (ref 30.0–36.0)
MCV: 86.1 fL (ref 80.0–100.0)
Platelets: 114 10*3/uL — ABNORMAL LOW (ref 150–400)
RBC: 2.8 MIL/uL — ABNORMAL LOW (ref 4.22–5.81)
RDW: 17.9 % — ABNORMAL HIGH (ref 11.5–15.5)
WBC: 12 10*3/uL — ABNORMAL HIGH (ref 4.0–10.5)
nRBC: 0 % (ref 0.0–0.2)

## 2023-06-18 LAB — BASIC METABOLIC PANEL
Anion gap: 10 (ref 5–15)
BUN: 20 mg/dL (ref 8–23)
CO2: 15 mmol/L — ABNORMAL LOW (ref 22–32)
Calcium: 7.9 mg/dL — ABNORMAL LOW (ref 8.9–10.3)
Chloride: 108 mmol/L (ref 98–111)
Creatinine, Ser: 1.22 mg/dL (ref 0.61–1.24)
GFR, Estimated: >60 mL/min (ref >60)
Glucose, Bld: 91 mg/dL (ref 70–99)
Potassium: 3.9 mmol/L (ref 3.5–5.1)
Sodium: 133 mmol/L — ABNORMAL LOW (ref 135–145)

## 2023-06-18 LAB — GLUCOSE, CAPILLARY
Glucose-Capillary: 90 mg/dL (ref 70–99)
Glucose-Capillary: 143 mg/dL — ABNORMAL HIGH (ref 70–99)
Glucose-Capillary: 159 mg/dL — ABNORMAL HIGH (ref 70–99)
Glucose-Capillary: 299 mg/dL — ABNORMAL HIGH (ref 70–99)
Glucose-Capillary: 237 mg/dL — ABNORMAL HIGH (ref 70–99)

## 2023-06-18 LAB — HEPARIN LEVEL (UNFRACTIONATED)
Heparin Unfractionated: 0.56 [IU]/mL (ref 0.30–0.70)
Heparin Unfractionated: 0.55 [IU]/mL (ref 0.30–0.70)

## 2023-06-18 MED ORDER — INSULIN ASPART 100 UNIT/ML IJ SOLN
0.0000 [IU] | Freq: Three times a day (TID) | INTRAMUSCULAR | Status: DC
  Administered 2023-06-18 (×3): 3 [IU] via SUBCUTANEOUS
  Administered 2023-06-19: 2 [IU] via SUBCUTANEOUS

## 2023-06-18 MED ORDER — SODIUM BICARBONATE 650 MG PO TABS
650.0000 mg | ORAL_TABLET | Freq: Three times a day (TID) | ORAL | Status: DC
  Administered 2023-06-18 – 2023-06-29 (×35): 650 mg via ORAL
  Filled 2023-06-18 (×36): qty 1

## 2023-06-18 MED ORDER — INSULIN ASPART 100 UNIT/ML IJ SOLN
0.0000 [IU] | Freq: Every day | INTRAMUSCULAR | Status: DC
  Administered 2023-06-18: 2 [IU] via SUBCUTANEOUS
  Administered 2023-06-20 – 2023-06-22 (×2): 3 [IU] via SUBCUTANEOUS
  Administered 2023-06-25: 2 [IU] via SUBCUTANEOUS

## 2023-06-18 NOTE — Progress Notes (Signed)
PROGRESS NOTE  Derek Thompson UEA:[id number] DOB: 03-06-1961   PCP: Rana Snare, DO  Patient is from: Home.  Lives with wife.  DOA: 06/13/2023 LOS: 5  Chief complaints Chief Complaint  Patient presents with   Hyperglycemia   Weakness     Brief Narrative / Interim history:  63 y.o M w/ PMHx of poorly controlled T2DM, HFrEF (LVEF 25%), LBBB, G2DD and LV global hypokinesis, HTN, CAD, Prostate Cancer treated w/ radiation 2022 who presented to ED for 1 week of progressive generalized weakness, 2 days AMS, admitted for suspected HHS, encephalopathy and generalized weakness on 06/13/2023. Initial labs showed CBG 548, BHA 1.04. BNP >4,500, WBC 21.8 w/ Abs Neut 20.5, Hgb A1c 13.3, lactic acid 2.6, Cr 1.98, Serum osmolality 302. VBG: 7.48/27.9/21.1.    Rapid Response 2/12 called for symptomatic hypotension. Started to Levophed gtt and transferred to ICU.  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  Right foot x-ray also showed osteomyelitis of great toe and second toes.  Neurology and orthopedic surgery consulted.  TTE showed large LV apical thrombus.  Cardiology consulted.  Patient was started on anticoagulation for CVA.  He was started on broad-spectrum antibiotics for osteomyelitis/septic shock.  There was also concern about PAD.  Vascular surgery consulted.  He underwent angiogram on 2/14 that demonstrated inline flow to the foot without significant stenosis.  Patient is transferred to hospitalist service on 06/18/2023.  He remains on IV heparin for anticoagulation and broad-spectrum antibiotics for osteomyelitis pending orthopedic surgical intervention on 06/21/2023.   CTH: probably acute or subacute infarcts in the posterior right frontal lobe. Possible slight petechial hemorrhage without mass occupying acute Hemorrhage    MR brain: Acute or early subacute confluent infarct in the right frontal lobe. Additional smaller acute infarcts in the right cerebellum, left frontal, parietal and occipital lobes. Given involvement of multiple vascular territories, consider an embolic etiology.   CXR: Cardiomegaly with interstitial coarsening which may be due to edema or atypical infection.   R foot X ray showed osseous destruction involving the tuft of the distal phalanx of the great toe, as well as in the second toe involving both the distal and mid phalanges centered around the DIP joint. Overlying soft tissues are irregular with probable soft tissue gas   ABIs showed Abnormal R and L toe-brachial index    ECHO 2/12: Large 3 x 3 cm round heterogenous mass at the LV apex which is suggestive of thrombus given severe LV dysfunction. EF 20-25%, global hypokinesis, grade II diastolic dysfunction, mildly reduced RV function, mildly elevated pulmonary artery pressure, elevated LVEDP, moderate MR.   Subjective: Seen and examined earlier this morning.  No major events overnight or this morning.  No complaints but not a great historian.  He is awake and alert and oriented x 4 but not a great historian.  Objective: Vitals:   06/18/23 0400 06/18/23 0500 06/18/23 0600 06/18/23 0714  BP: (!) 90/54 113/64 (!) 98/54   Pulse: 76 78 78   Resp: 15 18 16    Temp:    97.9 F (36.6 C)  TempSrc:    Oral  SpO2: 94% (!) 89% 99%   Weight:      Height:        Examination:  GENERAL: No apparent distress.  Nontoxic. HEENT: MMM.  Vision and hearing grossly intact.  NECK: Supple.  No apparent JVD.  RESP:  No  IWOB.  Fair aeration bilaterally. CVS:  RRR. Heart sounds normal.  ABD/GI/GU: BS+. Abd soft, NTND.  MSK/EXT:  Moves extremities.  Right great toe and second toe ulceration with foul smells.  No apparent discharge.  Seen recurrent discoloration of right second toe. SKIN: As above. NEURO: Awake, alert and oriented appropriately.   Follows commands.  No apparent focal neuro deficit. PSYCH: Calm. Normal affect.   Procedures:  2/14-RLE angiogram by vascular surgery  Microbiology summarized: COVID-19, influenza and RSV PCR nonreactive Blood cultures NGTD  Assessment and plan: Septic shock likely due to osteomyelitis Right foot wet gangrene, suspected osteomyelitis  -X-ray concerning for osteomyelitis in right great toe and second toe. 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  Patient did not have acute focal neurodeficit.  TTE shows large 3 x 3 cm mass at the LV apex -Continue IV heparin for anticoagulation -Also on aspirin and statin. -Consider CT angio head and neck once renal function back to baseline. -Neurology signed off.   Chronic combined CHF: EF 20-25%, GH, G2DD, dilated IVC with 3x3 cm mass at LV apex suggestive of thrombus.  Appears euvolemic on exam. -Diuretics and cardiac meds held due to hypotension/shock. 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  No anginal symptoms. -Cardiac meds on hold due to hypotension -Continue metoprolol, Lipitor and aspirin.  Wide-QRS tachycardia: EKG 2/13 showed HR 156, wide QRS 140 ms and QTC 541 ms. LBBB.  -Cardiology on board -Continue digoxin 0.125 mg daily -Continue metoprolol 25 mg BID   Hypotension/history of hypertension: Soft BP but within normal -Cardiac meds as above.  Poorly controlled NIDDM-2 with HHS: A1c 13.3%.  Likely due to noncompliance. Recent Labs  Lab 06/17/23 1532 06/17/23 1908 06/17/23 2323 06/18/23 0352 06/18/23 0713  GLUCAP 137* 163*  111* 90 143*  -Continue current insulin regimen -Further adjustment as appropriate.  AKI: Baseline Cr about 1.0.  Resolving. Recent Labs    11/22/22 1445 06/13/23 1939 06/13/23 1959 06/13/23 2228 06/14/23 0452 06/14/23 1255 06/15/23 0414 06/16/23 0404 06/17/23 0654 06/18/23 0321  BUN 24 19 21 20 20 20 17 16 20 20   CREATININE 0.99 1.98* 1.90* 1.80* 1.84* 1.51* 1.25* 1.26* 1.36* 1.22  -Avoid nephrotoxic meds -Continue holding cardiac meds -Avoid hypotension  AGMA/lactic acidosis: -Start p.o. sodium bicarbonate -Glycemic control as above Cr 1.36, baseline around 0.99. Creatinine is improving.  - continue to trend Cr   Normocytic Anemia: H&H stable after initial drop.  No nutritional deficiency on anemia panel. Recent Labs    10/14/22 0804 10/14/22 0808 10/14/22 0809 06/13/23 1940 06/13/23 1958 06/13/23 1959 06/15/23 0414 06/16/23 0404 06/17/23 0200 06/18/23 0321  HGB 10.5* 10.9* 11.2* 8.9* 10.2* 10.2* 8.1* 8.4* 7.8* 7.8*  -Continue monitoring -He might benefit from 1 unit  Thrombocytopenia: Likely due to sepsis.  Relatively stable now -Continue monitoring   Elevated TSH and T4: Unreliable in acute setting. -Recommend rechecking thyroid panel in 3 to 4 weeks  Hyponatremia: Mild -Monitor  Physical deconditioning -PT/OT Body mass index is 23.17 kg/m.           DVT prophylaxis:  SCDs Start: 06/13/23 2205 On full dose anticoagulation Code Status: Full code Family Communication: None at  bedside Level of care: Progressive Status is: Inpatient Remains inpatient appropriate because: Septic shock, osteomyelitis, AKI   Final disposition: SNF Consultants:  Pulmonology Neurology Cardiology Orthopedic surgery  55 minutes with more than 50% spent in reviewing records, counseling patient/family and coordinating care.   Sch Meds:  Scheduled Meds:  aspirin EC  81 mg Oral Daily   atorvastatin  80 mg Oral Daily   Chlorhexidine Gluconate Cloth  6 each  Topical Daily   digoxin  0.125 mg Oral Daily   insulin aspart  0-5 Units Subcutaneous QHS   insulin aspart  0-9 Units Subcutaneous TID WC   insulin glargine-yfgn  12 Units Subcutaneous Daily   metoprolol tartrate  25 mg Oral BID   metroNIDAZOLE  500 mg Oral Q12H   pantoprazole  40 mg Oral Daily   sodium chloride flush  10-40 mL Intracatheter Q12H   Continuous Infusions:  ceFEPime (MAXIPIME) IV Stopped (06/18/23 0414)   heparin 1,900 Units/hr (06/18/23 0600)   vancomycin Stopped (06/17/23 2352)   PRN Meds:.acetaminophen **OR** acetaminophen, ondansetron (ZOFRAN) IV, mouth rinse, sodium chloride flush  Antimicrobials: Anti-infectives (From admission, onward)    Start     Dose/Rate Route Frequency Ordered Stop   06/15/23 2200  vancomycin (VANCOREADY) IVPB 1500 mg/300 mL        1,500 mg 150 mL/hr over 120 Minutes Intravenous Every 24 hours 06/15/23 1417     06/15/23 1845  ceFEPIme (MAXIPIME) 2 g in sodium chloride 0.9 % 100 mL IVPB        2 g 200 mL/hr over 30 Minutes Intravenous Every 8 hours 06/15/23 1445     06/14/23 1543  vancomycin variable dose per unstable renal function (pharmacist dosing)  Status:  Discontinued         Does not apply See admin instructions 06/14/23 1543 06/16/23 0709   06/14/23 1045  ceFEPIme (MAXIPIME) 2 g in sodium chloride 0.9 % 100 mL IVPB  Status:  Discontinued        2 g 200 mL/hr over 30 Minutes Intravenous Every 8 hours 06/14/23 0955 06/14/23 0956   06/14/23 1045  metroNIDAZOLE (FLAGYL) tablet 500 mg        500 mg Oral Every 12 hours 06/14/23 0955     06/14/23 1045  ceFEPIme (MAXIPIME) 2 g in sodium chloride 0.9 % 100 mL IVPB  Status:  Discontinued        2 g 200 mL/hr over 30 Minutes Intravenous Every 12 hours 06/14/23 0956 06/15/23 1445   06/14/23 1000  vancomycin (VANCOREADY) IVPB 1250 mg/250 mL  Status:  Discontinued        1,250 mg 166.7 mL/hr over 90 Minutes Intravenous Every 24 hours 06/14/23 0638 06/14/23 1545   06/14/23 0700  vancomycin  (VANCOREADY) IVPB 1500 mg/300 mL        1,500 mg 150 mL/hr over 120 Minutes Intravenous  Once 06/14/23 0611 06/14/23 1938   06/14/23 0130  azithromycin (ZITHROMAX) 500 mg in sodium chloride 0.9 % 250 mL IVPB  Status:  Discontinued        500 mg 250 mL/hr over 60 Minutes Intravenous Daily at bedtime 06/14/23 0100 06/14/23 0611   06/14/23 0130  cefTRIAXone (ROCEPHIN) 1 g in sodium chloride 0.9 % 100 mL IVPB  Status:  Discontinued        1 g 200 mL/hr over 30 Minutes Intravenous Daily at bedtime 06/14/23 0100 06/14/23 1610        I have personally reviewed the following labs and images: CBC:  Recent Labs  Lab 06/13/23 1940 06/13/23 1958 06/13/23 1959 06/15/23 0414 06/16/23 0404 06/17/23 0200 06/18/23 0321  WBC 21.8*  --   --  15.2* 12.6* 11.1* 12.0*  NEUTROABS 20.5*  --   --   --   --   --   --   HGB 8.9*   < > 10.2* 8.1* 8.4* 7.8* 7.8*  HCT 27.9*   < > 30.0* 25.1* 26.1* 24.7* 24.1*  MCV 87.5  --   --  85.4 85.3 86.4 86.1  PLT 195  --   --  127* 118* 122* 114*   < > = values in this interval not displayed.   BMP &GFR Recent Labs  Lab 06/13/23 2228 06/13/23 2228 06/14/23 0452 06/14/23 1255 06/14/23 2004 06/15/23 0414 06/16/23 0404 06/16/23 2031 06/17/23 0654 06/18/23 0321  NA 127*  --  130* 130*  --  135 131*  --  131* 133*  K 3.8  --  3.9 4.0  --  3.4* 4.5  --  4.5 3.9  CL 97*  --  99 102  --  105 104  --  107 108  CO2 19*  --  13* 19*  --  20* 19*  --  18* 15*  GLUCOSE 479*  --  195* 229*  --  162* 176*  --  149* 91  BUN 20  --  20 20  --  17 16  --  20 20  CREATININE 1.80*  --  1.84* 1.51*  --  1.25* 1.26*  --  1.36* 1.22  CALCIUM 7.7*  --  8.0* 7.7*  --  7.7* 7.6*  --  7.7* 7.9*  MG 1.5*  --  2.3  --   --  2.1 1.7  --  2.0  --   PHOS  --    < > 1.6*  --  2.3* 2.8 1.6* 3.5 2.8  --    < > = values in this interval not displayed.   Estimated Creatinine Clearance: 67.9 mL/min (by C-G formula based on SCr of 1.22 mg/dL). Liver & Pancreas: Recent Labs  Lab  06/13/23 1939 06/14/23 0452  AST 29 26  ALT 27 22  ALKPHOS 49 41  BILITOT 0.7 0.6  PROT 6.7 6.0*  ALBUMIN 2.1* 1.8*   Recent Labs  Lab 06/13/23 1939  LIPASE 18   Recent Labs  Lab 06/13/23 2031  AMMONIA 23   Diabetic: No results for input(s): "HGBA1C" in the last 72 hours. Recent Labs  Lab 06/17/23 1532 06/17/23 1908 06/17/23 2323 06/18/23 0352 06/18/23 0713  GLUCAP 137* 163* 111* 90 143*   Cardiac Enzymes: No results for input(s): "CKTOTAL", "CKMB", "CKMBINDEX", "TROPONINI" in the last 168 hours. No results for input(s): "PROBNP" in the last 8760 hours. Coagulation Profile: Recent Labs  Lab 06/14/23 0452  INR 1.7*   Thyroid Function Tests: No results for input(s): "TSH", "T4TOTAL", "FREET4", "T3FREE", "THYROIDAB" in the last 72 hours. Lipid Profile: No results for input(s): "CHOL", "HDL", "LDLCALC", "TRIG", "CHOLHDL", "LDLDIRECT" in the last 72 hours. Anemia Panel: No results for input(s): "VITAMINB12", "FOLATE", "FERRITIN", "TIBC", "IRON", "RETICCTPCT" in the last 72 hours. Urine analysis:    Component Value Date/Time   COLORURINE YELLOW 05/03/2020 1303   APPEARANCEUR CLEAR 05/03/2020 1303   LABSPEC 1.022 05/03/2020 1303   PHURINE 6.0 05/03/2020 1303   GLUCOSEU >=500 (A) 05/03/2020 1303   HGBUR NEGATIVE 05/03/2020 1303   BILIRUBINUR NEGATIVE 05/03/2020 1303   KETONESUR 80 (A) 05/03/2020 1303   PROTEINUR NEGATIVE  05/03/2020 1303   NITRITE NEGATIVE 05/03/2020 1303   LEUKOCYTESUR NEGATIVE 05/03/2020 1303   Sepsis Labs: Invalid input(s): "PROCALCITONIN", "LACTICIDVEN"  Microbiology: Recent Results (from the past 240 hours)  Resp panel by RT-PCR (RSV, Flu A&B, Covid) Anterior Nasal Swab     Status: None   Collection Time: 06/13/23  8:14 PM   Specimen: Anterior Nasal Swab  Result Value Ref Range Status   SARS Coronavirus 2 by RT PCR NEGATIVE NEGATIVE Final   Influenza A by PCR NEGATIVE NEGATIVE Final   Influenza B by PCR NEGATIVE NEGATIVE Final     Comment: (NOTE) The Xpert Xpress SARS-CoV-2/FLU/RSV plus assay is intended as an aid in the diagnosis of influenza from Nasopharyngeal swab specimens and should not be used as a sole basis for treatment. Nasal washings and aspirates are unacceptable for Xpert Xpress SARS-CoV-2/FLU/RSV testing.  Fact Sheet for Patients: BloggerCourse.com  Fact Sheet for Healthcare Providers: SeriousBroker.it  This test is not yet approved or cleared by the [place] FDA and has been authorized for detection and/or diagnosis of SARS-CoV-2 by FDA under an Emergency Use Authorization (EUA). This EUA will remain in effect (meaning this test can be used) for the duration of the COVID-19 declaration under Section 564(b)(1) of the Act, 21 U.S.C. section 360bbb-3(b)(1), unless the authorization is terminated or revoked.     Resp Syncytial Virus by PCR NEGATIVE NEGATIVE Final    Comment: (NOTE) Fact Sheet for Patients: BloggerCourse.com  Fact Sheet for Healthcare Providers: SeriousBroker.it  This test is not yet approved or cleared by the [place] FDA and has been authorized for detection and/or diagnosis of SARS-CoV-2 by FDA under an Emergency Use Authorization (EUA). This EUA will remain in effect (meaning this test can be used) for the duration of the COVID-19 declaration under Section 564(b)(1) of the Act, 21 U.S.C. section 360bbb-3(b)(1), unless the authorization is terminated or revoked.  Performed at [facility], [address] [place], [name] [address]   Culture, blood (Routine X 2) w Reflex to ID Panel     Status: None (Preliminary result)   Collection Time: 06/14/23  4:52 AM   Specimen: BLOOD  Result Value Ref Range Status   Specimen Description BLOOD RIGHT ANTECUBITAL  Final   Special Requests   Final    BOTTLES DRAWN AEROBIC AND ANAEROBIC Blood Culture results may not be  optimal due to an inadequate volume of blood received in culture bottles   Culture   Final    NO GROWTH 4 DAYS Performed at [facility], [address], [place], [name] [address]    Report Status PENDING  Incomplete  Culture, blood (Routine X 2) w Reflex to ID Panel     Status: None (Preliminary result)   Collection Time: 06/14/23  5:00 AM   Specimen: BLOOD  Result Value Ref Range Status   Specimen Description BLOOD RIGHT ANTECUBITAL  Final   Special Requests   Final    BOTTLES DRAWN AEROBIC AND ANAEROBIC Blood Culture results may not be optimal due to an inadequate volume of blood received in culture bottles   Culture   Final    NO GROWTH 4 DAYS Performed at [facility], [address], [place], [name] [address]    Report Status PENDING  Incomplete  MRSA Next Gen by PCR, Nasal     Status: None   Collection Time: 06/14/23  5:15 AM   Specimen: Nasal Mucosa; Nasal Swab  Result Value Ref Range Status  MRSA by PCR Next Gen NOT DETECTED NOT DETECTED Final    Comment: (NOTE) The GeneXpert MRSA Assay (FDA approved for NASAL specimens only), is one component of a comprehensive MRSA colonization surveillance program. It is not intended to diagnose MRSA infection nor to guide or monitor treatment for MRSA infections. Test performance is not FDA approved in patients less than 38 years old. Performed at [facility], [address], [place], [name] [address]     Radiology Studies: No results found.    Cledith Abdou T. Tyland Klemens [facility]  If 7PM-7AM, please contact night-coverage [email or web address] 06/18/2023, 10:07 AM

## 2023-06-18 NOTE — Progress Notes (Signed)
PHARMACY - ANTICOAGULATION CONSULT NOTE  Pharmacy Consult for IV heparin Indication: LV thrombus + thromboembolic vs ischemic stroke   No Known Allergies  Patient Measurements: Height: 6' (182.9 cm) Weight: 77.5 kg (170 lb 13.7 oz) IBW/kg (Calculated) : 77.6 Heparin Dosing Weight: Actual body weight  Vital Signs: Temp: 97.9 F (36.6 C) (02/16 0714) Temp Source: Oral (02/16 0714) BP: 98/54 (02/16 0600) Pulse Rate: 78 (02/16 0600)  Labs: Recent Labs    06/16/23 0404 06/17/23 0200 06/17/23 0654 06/17/23 1002 06/18/23 0321  HGB 8.4* 7.8*  --   --  7.8*  HCT 26.1* 24.7*  --   --  24.1*  PLT 118* 122*  --   --  114*  HEPARINUNFRC 0.27* 0.32  --  0.37 0.56  CREATININE 1.26*  --  1.36*  --  1.22    Estimated Creatinine Clearance: 67.9 mL/min (by C-G formula based on SCr of 1.22 mg/dL).  Assessment: Derek Thompson is a 63 y.o M w/ PMHx of poorly controlled T2DM, HFrEF (LVEF 25%), LBBB, G2DD and LV global hypokinesis, HTN, CAD, Prostate Cancer treated w/ radiation 2022 who presented to ED for 1 week of progressive generalized weakness, 2 days AMS, admitted for suspected HHS. 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 Note patient also with acute/subacute infarct in R frontal lobe, with suspicion for embolic etiology.  Daily heparin slightly supratherapeutic for low goal and uptrending. No signs/symptoms of bleeding or issues with the heparin infusion noted.  Goal of Therapy:  Heparin level 0.3-0.5 units/ml  (targeting low end for concominant CVA/ w/ petechial hemorrhage) Monitor platelets by anticoagulation protocol: Yes   Plan:  Slight decrease to 1850 units/h HL in 6 hours Daily and PRN heparin level Monitor daily CBC, s/sx bleeding F/u long term plan  Thank you for allowing pharmacy to participate in this patient's care,  Rutherford Nail, PharmD PGY2 Critical Care Pharmacy Resident 06/18/2023 7:34 AM

## 2023-06-19 DIAGNOSIS — I240 Acute coronary thrombosis not resulting in myocardial infarction: Secondary | ICD-10-CM | POA: Diagnosis not present

## 2023-06-19 DIAGNOSIS — I4729 Other ventricular tachycardia: Secondary | ICD-10-CM

## 2023-06-19 DIAGNOSIS — I5022 Chronic systolic (congestive) heart failure: Secondary | ICD-10-CM | POA: Diagnosis not present

## 2023-06-19 DIAGNOSIS — I10 Essential (primary) hypertension: Secondary | ICD-10-CM

## 2023-06-19 DIAGNOSIS — Z91199 Patient's noncompliance with other medical treatment and regimen due to unspecified reason: Secondary | ICD-10-CM

## 2023-06-19 DIAGNOSIS — G9341 Metabolic encephalopathy: Secondary | ICD-10-CM | POA: Diagnosis not present

## 2023-06-19 DIAGNOSIS — E782 Mixed hyperlipidemia: Secondary | ICD-10-CM

## 2023-06-19 DIAGNOSIS — Z01810 Encounter for preprocedural cardiovascular examination: Secondary | ICD-10-CM

## 2023-06-19 DIAGNOSIS — A419 Sepsis, unspecified organism: Secondary | ICD-10-CM | POA: Diagnosis not present

## 2023-06-19 DIAGNOSIS — I639 Cerebral infarction, unspecified: Secondary | ICD-10-CM

## 2023-06-19 DIAGNOSIS — R6510 Systemic inflammatory response syndrome (SIRS) of non-infectious origin without acute organ dysfunction: Secondary | ICD-10-CM | POA: Diagnosis not present

## 2023-06-19 DIAGNOSIS — E1100 Type 2 diabetes mellitus with hyperosmolarity without nonketotic hyperglycemic-hyperosmolar coma (NKHHC): Secondary | ICD-10-CM | POA: Diagnosis not present

## 2023-06-19 DIAGNOSIS — D649 Anemia, unspecified: Secondary | ICD-10-CM | POA: Diagnosis not present

## 2023-06-19 LAB — CULTURE, BLOOD (ROUTINE X 2)
Culture: NO GROWTH
Culture: NO GROWTH

## 2023-06-19 LAB — GLUCOSE, CAPILLARY
Glucose-Capillary: 219 mg/dL — ABNORMAL HIGH (ref 70–99)
Glucose-Capillary: 233 mg/dL — ABNORMAL HIGH (ref 70–99)
Glucose-Capillary: 233 mg/dL — ABNORMAL HIGH (ref 70–99)
Glucose-Capillary: 167 mg/dL — ABNORMAL HIGH (ref 70–99)

## 2023-06-19 LAB — MAGNESIUM: Magnesium: 1.8 mg/dL (ref 1.7–2.4)

## 2023-06-19 LAB — CBC
HCT: 23.4 % — ABNORMAL LOW (ref 39.0–52.0)
Hemoglobin: 7.3 g/dL — ABNORMAL LOW (ref 13.0–17.0)
MCH: 27.1 pg (ref 26.0–34.0)
MCHC: 31.2 g/dL (ref 30.0–36.0)
MCV: 87 fL (ref 80.0–100.0)
Platelets: 110 10*3/uL — ABNORMAL LOW (ref 150–400)
RBC: 2.69 MIL/uL — ABNORMAL LOW (ref 4.22–5.81)
RDW: 18 % — ABNORMAL HIGH (ref 11.5–15.5)
WBC: 8 10*3/uL (ref 4.0–10.5)
nRBC: 0 % (ref 0.0–0.2)

## 2023-06-19 LAB — COMPREHENSIVE METABOLIC PANEL
ALT: 13 U/L (ref 0–44)
AST: 12 U/L — ABNORMAL LOW (ref 15–41)
Albumin: <1.5 g/dL — ABNORMAL LOW (ref 3.5–5.0)
Alkaline Phosphatase: 37 U/L — ABNORMAL LOW (ref 38–126)
Anion gap: 6 (ref 5–15)
BUN: 23 mg/dL (ref 8–23)
CO2: 17 mmol/L — ABNORMAL LOW (ref 22–32)
Calcium: 7.6 mg/dL — ABNORMAL LOW (ref 8.9–10.3)
Chloride: 110 mmol/L (ref 98–111)
Creatinine, Ser: 1.28 mg/dL — ABNORMAL HIGH (ref 0.61–1.24)
GFR, Estimated: >60 mL/min (ref >60)
Glucose, Bld: 241 mg/dL — ABNORMAL HIGH (ref 70–99)
Potassium: 4.4 mmol/L (ref 3.5–5.1)
Sodium: 133 mmol/L — ABNORMAL LOW (ref 135–145)
Total Bilirubin: 0.6 mg/dL (ref 0.0–1.2)
Total Protein: 5.6 g/dL — ABNORMAL LOW (ref 6.5–8.1)

## 2023-06-19 LAB — BRAIN NATRIURETIC PEPTIDE: B Natriuretic Peptide: 2164.5 pg/mL — ABNORMAL HIGH (ref 0.0–100.0)

## 2023-06-19 LAB — HEMOGLOBIN AND HEMATOCRIT, BLOOD
HCT: 29.2 % — ABNORMAL LOW (ref 39.0–52.0)
Hemoglobin: 9.4 g/dL — ABNORMAL LOW (ref 13.0–17.0)

## 2023-06-19 LAB — HEPARIN LEVEL (UNFRACTIONATED): Heparin Unfractionated: 0.5 [IU]/mL (ref 0.30–0.70)

## 2023-06-19 LAB — VANCOMYCIN, PEAK: Vancomycin Pk: 49 ug/mL — ABNORMAL HIGH (ref 30–40)

## 2023-06-19 LAB — PREPARE RBC (CROSSMATCH)

## 2023-06-19 LAB — PHOSPHORUS: Phosphorus: 2.1 mg/dL — ABNORMAL LOW (ref 2.5–4.6)

## 2023-06-19 MED ORDER — SODIUM CHLORIDE 0.9% IV SOLUTION: Freq: Once | INTRAVENOUS | Status: AC

## 2023-06-19 MED ORDER — POTASSIUM & SODIUM PHOSPHATES 280-160-250 MG PO PACK
2.0000 | PACK | ORAL | Status: AC
  Administered 2023-06-19 (×3): 2 via ORAL
  Filled 2023-06-19 (×3): qty 2

## 2023-06-19 MED ORDER — INSULIN ASPART 100 UNIT/ML IJ SOLN
0.0000 [IU] | Freq: Three times a day (TID) | INTRAMUSCULAR | Status: DC
  Administered 2023-06-19 – 2023-06-20 (×3): 5 [IU] via SUBCUTANEOUS
  Administered 2023-06-20: 3 [IU] via SUBCUTANEOUS
  Administered 2023-06-21: 8 [IU] via SUBCUTANEOUS
  Administered 2023-06-21: 5 [IU] via SUBCUTANEOUS
  Administered 2023-06-21: 3 [IU] via SUBCUTANEOUS
  Administered 2023-06-22: 8 [IU] via SUBCUTANEOUS
  Administered 2023-06-23: 5 [IU] via SUBCUTANEOUS
  Administered 2023-06-23 – 2023-06-28 (×11): 3 [IU] via SUBCUTANEOUS
  Administered 2023-06-28 – 2023-06-29 (×2): 2 [IU] via SUBCUTANEOUS
  Administered 2023-06-29: 3 [IU] via SUBCUTANEOUS

## 2023-06-19 MED ORDER — INSULIN GLARGINE-YFGN 100 UNIT/ML ~~LOC~~ SOLN
15.0000 [IU] | Freq: Every day | SUBCUTANEOUS | Status: DC
  Administered 2023-06-20 – 2023-06-26 (×7): 15 [IU] via SUBCUTANEOUS
  Filled 2023-06-19 (×8): qty 0.15

## 2023-06-19 MED ORDER — VANCOMYCIN HCL 750 MG/150ML IV SOLN
750.0000 mg | INTRAVENOUS | Status: DC
  Administered 2023-06-19 – 2023-06-21 (×3): 750 mg via INTRAVENOUS
  Filled 2023-06-19 (×3): qty 150

## 2023-06-19 MED ORDER — FUROSEMIDE 10 MG/ML IJ SOLN
20.0000 mg | Freq: Once | INTRAMUSCULAR | Status: AC
  Administered 2023-06-19: 20 mg via INTRAVENOUS
  Filled 2023-06-19: qty 2

## 2023-06-19 NOTE — Progress Notes (Addendum)
Pharmacy Antibiotic Note  Derek Thompson is a 63 y.o. male admitted on 06/13/2023 with  osteomyelitis .  Pharmacy has been consulted for vancomycin dosing. 2/17 vancomycin peak 49, 2/16 vancomycin trough 22, Scr stable at ~1.2, baseline appears around 1.1-1.2. Planning for surgical intervention on 2/19.   Plan: Cefepime 2g q8h and flagyl 500mg  q12h per MD.  Vancomycin 1500mg  q24h AUC: 848, reduce to vancomycin 750mg  q24h for eAUC: 424.  F/u source control on 2/19 for DOT.   Height: 6' (182.9 cm) Weight: 83.7 kg (184 lb 8.4 oz) IBW/kg (Calculated) : 77.6  Temp (24hrs), Avg:97.7 F (36.5 C), Min:97.4 F (36.3 C), Max:98 F (36.7 C)  Recent Labs  Lab 06/13/23 1959 06/13/23 2228 06/13/23 2232 06/14/23 0452 06/14/23 1255 06/15/23 0414 06/15/23 1018 06/16/23 0404 06/17/23 0200 06/17/23 0654 06/18/23 0321 06/18/23 2130 06/19/23 0135 06/19/23 0351  WBC  --   --   --   --   --  15.2*  --  12.6* 11.1*  --  12.0*  --   --  8.0  CREATININE 1.90*   < >  --  1.84*   < > 1.25*  --  1.26*  --  1.36* 1.22  --   --  1.28*  LATICACIDVEN 2.6*  --  2.3* 8.7*  --   --   --   --   --   --   --   --   --   --   VANCOTROUGH  --   --   --   --   --   --   --   --   --   --   --  22*  --   --   VANCOPEAK  --   --   --   --   --   --   --   --   --   --   --   --  3*  --   VANCORANDOM  --   --   --   --   --   --  18  --   --   --   --   --   --   --    < > = values in this interval not displayed.    Estimated Creatinine Clearance: 64.8 mL/min (A) (by C-G formula based on SCr of 1.28 mg/dL (H)).    No Known Allergies  Antimicrobials this admission: Cefepime 2/12 >>  Vancomycin 2/12 >>  Metronidazole 2/12 >>   Dose adjustments this admission: Vancomycin 1500mg  q24h AUC: 848 >> Vancomycin 750mg  q24h   Microbiology results: 2/12 BCx: NGTD x 5d  2/12 MRSA PCR: negative  Thank you for allowing pharmacy to be a part of this patient's care.  Estill Batten, PharmD, BCCCP  06/19/2023 10:03  AM

## 2023-06-19 NOTE — TOC Progression Note (Signed)
Transition of Care [facility]) - Progression Note    Patient Details  Name: Derek Thompson MRN: [id number] Date of Birth: January 12, 1961  [facility] of [facility]) CM/SW Contact  Mearl Latin, LCSW Phone Number: 06/19/2023, 2:50 PM  Clinical Narrative:    CSW submitted 30 day note to [facility] as requested.    Expected Discharge Plan: Skilled Nursing Facility Barriers to Discharge: Continued Medical Work up  Expected Discharge Plan and Services In-house Referral: Clinical Social Work     Living arrangements for the past 2 months: Hotel/Motel                                       Social Determinants of Health (SDOH) Interventions SDOH Screenings   Food Insecurity: No Food Insecurity (06/14/2023)  Housing: Low Risk  (06/14/2023)  Transportation Needs: No Transportation Needs (06/14/2023)  Utilities: Not At Risk (06/14/2023)  Alcohol Screen: Low Risk  (08/19/2022)  Depression (PHQ2-9): Medium Risk (11/22/2022)  Financial Resource Strain: Low Risk  (08/19/2022)  Tobacco Use: Medium Risk (06/14/2023)    Readmission Risk Interventions     No data to display

## 2023-06-19 NOTE — Plan of Care (Signed)
  Problem: Education: Goal: Knowledge of General Education information will improve Description: Including pain rating scale, medication(s)/side effects and non-pharmacologic comfort measures Outcome: Progressing   Problem: Health Behavior/Discharge Planning: Goal: Ability to manage health-related needs will improve Outcome: Progressing   Problem: Clinical Measurements: Goal: Ability to maintain clinical measurements within normal limits will improve Outcome: Progressing   Problem: Ischemic Stroke/TIA Tissue Perfusion: Goal: Complications of ischemic stroke/TIA will be minimized Outcome: Progressing   Problem: Coping: Goal: Will verbalize positive feelings about self Outcome: Progressing

## 2023-06-19 NOTE — Progress Notes (Signed)
Physical Therapy Treatment Patient Details Name: Derek Thompson MRN: [id number] DOB: 08-25-1960 Today's Date: 06/19/2023   History of Present Illness Pt is a 63 y/o M presenting to Ed on 2/11 with 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 PMH: poorly controlled DM2, chronic diastolic CHF, chronic R sided heart failure, anemia of chronic disease, L bundle branch block    PT Comments  Pt is limited by fatigue during session but able to progress to gait training for short distances. Pt also demonstrates improvement in transfer quality and LE strength. Pt is encouraged to mobilize frequently with staff assistance, PT also provides education on LE exercise for further strengthening. Patient will benefit from continued inpatient follow up therapy, <3 hours/day.    If plan is discharge home, recommend the following: A little help with walking and/or transfers;A lot of help with bathing/dressing/bathroom;Assistance with cooking/housework;Help with stairs or ramp for entrance;Assist for transportation   Can travel by private vehicle     Yes  Equipment Recommendations   (defer to post-acute setting)    Recommendations for Other Services       Precautions / Restrictions Precautions Precautions: Fall Restrictions Weight Bearing Restrictions Per Provider Order: No     Mobility  Bed Mobility Overal bed mobility: Needs Assistance Bed Mobility: Sit to Supine       Sit to supine: Min assist        Transfers Overall transfer level: Needs assistance Equipment used: Rolling walker (2 wheels) Transfers: Sit to/from Stand Sit to Stand: Min assist           General transfer comment: posterior lean    Ambulation/Gait Ambulation/Gait assistance: Contact guard assist Gait Distance (Feet): 15 Feet (2 brief standing breaks due to fatigue) Assistive device: Rolling walker (2 wheels) Gait Pattern/deviations: Step-to  pattern Gait velocity: reduced Gait velocity interpretation: <1.31 ft/sec, indicative of household ambulator   General Gait Details: slowed step-to gait, bilateral LE external rotation when ambulating which pt reports is baseline   Optometrist     Tilt Bed    Modified Rankin (Stroke Patients Only)       Balance Overall balance assessment: Needs assistance Sitting-balance support: No upper extremity supported, Feet supported Sitting balance-Leahy Scale: Fair     Standing balance support: Bilateral upper extremity supported, Reliant on assistive device for balance Standing balance-Leahy Scale: Poor                              Communication Communication Communication: No apparent difficulties  Cognition Arousal: Alert Behavior During Therapy: Flat affect   PT - Cognitive impairments: Problem solving                         Following commands: Intact      Cueing Cueing Techniques: Verbal cues  Exercises General Exercises - Lower Extremity Heel Slides: AROM, Both Straight Leg Raises: AROM, Both, 5 reps Other Exercises Other Exercises: bridging exercise in supine for hip extensor strengthening x 5 reps    General Comments General comments (skin integrity, edema, etc.): VSS on RA      Pertinent Vitals/Pain Pain Assessment Pain Assessment: No/denies pain    Home Living  Prior Function            PT Goals (current goals can now be found in the care plan section) Acute Rehab PT Goals Patient Stated Goal: to get better Progress towards PT goals: Progressing toward goals    Frequency    Min 1X/week      PT Plan      Co-evaluation PT/OT/SLP Co-Evaluation/Treatment: Yes Reason for Co-Treatment: For patient/therapist safety;To address functional/ADL transfers PT goals addressed during session: Mobility/safety with mobility;Balance;Proper use of  DME;Strengthening/ROM        AM-PAC PT "6 Clicks" Mobility   Outcome Measure  Help needed turning from your back to your side while in a flat bed without using bedrails?: A Little Help needed moving from lying on your back to sitting on the side of a flat bed without using bedrails?: A Little Help needed moving to and from a bed to a chair (including a wheelchair)?: A Little Help needed standing up from a chair using your arms (e.g., wheelchair or bedside chair)?: A Little Help needed to walk in hospital room?: A Lot Help needed climbing 3-5 steps with a railing? : Total 6 Click Score: 15    End of Session Equipment Utilized During Treatment: Gait belt Activity Tolerance: Patient limited by fatigue Patient left: in bed;with call bell/phone within reach;with bed alarm set Nurse Communication: Mobility status PT Visit Diagnosis: Unsteadiness on feet (R26.81);Muscle weakness (generalized) (M62.81);Difficulty in walking, not elsewhere classified (R26.2)     Time: 9562-1308 PT Time Calculation (min) (ACUTE ONLY): 23 min  Charges:    $Gait Training: 8-22 mins PT General Charges $$ ACUTE PT VISIT: 1 Visit                     Arlyss Gandy, PT, DPT Acute Rehabilitation Office [phone]    Arlyss Gandy 06/19/2023, 1:44 PM

## 2023-06-19 NOTE — Progress Notes (Addendum)
PROGRESS NOTE  Derek Thompson WUJ:[id number] DOB: 06/07/1960   PCP: Rana Snare, DO  Patient is from: Home.  Lives with wife.  DOA: 06/13/2023 LOS: 6  Chief complaints Chief Complaint  Patient presents with   Hyperglycemia   Weakness     Brief Narrative / Interim history:  63 y.o M w/ PMHx of poorly controlled T2DM, HFrEF (LVEF 25%), LBBB, G2DD and LV global hypokinesis, HTN, CAD, Prostate Cancer treated w/ radiation 2022 who presented to ED for 1 week of progressive generalized weakness, 2 days AMS, admitted for suspected HHS, encephalopathy and generalized weakness on 06/13/2023. Initial labs showed CBG 548, BHA 1.04. BNP >4,500, WBC 21.8 w/ Abs Neut 20.5, Hgb A1c 13.3, lactic acid 2.6, Cr 1.98, Serum osmolality 302. VBG: 7.48/27.9/21.1.    Rapid Response 2/12 called for symptomatic hypotension. Started to Levophed gtt and transferred to ICU.  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  Right foot x-ray also showed osteomyelitis of great toe and second toes.  Neurology and orthopedic surgery consulted.  TTE showed large LV apical thrombus.  Cardiology consulted.  Patient was started on anticoagulation for CVA.  He was started on broad-spectrum antibiotics for osteomyelitis/septic shock.  There was also concern about PAD.  Vascular surgery consulted.  He underwent angiogram on 2/14 that demonstrated inline flow to the foot without significant stenosis.  Patient is transferred to hospitalist service on 06/18/2023.  He remains on IV heparin for anticoagulation and broad-spectrum antibiotics for osteomyelitis pending orthopedic surgical intervention on 06/21/2023.   Subjective: Seen and examined earlier this morning.  No major events overnight of this morning.  No specific complaints other than feeling tired.  Reports  having bowel movements.  Unsure about melena or hematochezia.  No report of this from staff.  Objective: Vitals:   06/19/23 0600 06/19/23 0700 06/19/23 0723 06/19/23 0800  BP: 106/62 107/61  118/70  Pulse: 73 71 75 77  Resp: 17 16 (!) 21 (!) 21  Temp:   97.7 F (36.5 C)   TempSrc:   Oral   SpO2: 100% 100% 100% 100%  Weight:      Height:        Examination:  GENERAL: No apparent distress.  Nontoxic. HEENT: MMM.  Vision and hearing grossly intact.  NECK: Supple.  No apparent JVD.  RESP:  No IWOB.  Fair aeration bilaterally. CVS:  RRR. Heart sounds normal.  ABD/GI/GU: BS+. Abd soft, NTND.  MSK/EXT:  Moves extremities.  Right great toe and second toe ulceration with foul smells.  No apparent discharge.  Seen recurrent discoloration of right second toe. SKIN: As above. NEURO: Awake, alert and oriented appropriately.  Follows commands.  No apparent focal neuro deficit. PSYCH: Calm. Normal affect.   Procedures:  2/14-RLE angiogram by vascular surgery  Microbiology summarized: COVID-19, influenza and RSV PCR nonreactive Blood cultures NGTD  Assessment and plan: Septic shock likely due to osteomyelitis Right foot wet gangrene, suspected osteomyelitis  -X-ray concerning for osteomyelitis in right great toe and second toe. 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  Patient did not have acute focal neurodeficit.  TTE shows large 3 x 3 cm mass at the LV apex -  Continue IV heparin for anticoagulation -Neurology recommended anticoagulation with heparin and transitioning to Eliquis after surgery -Per neurology, CT angio head and neck would not change treatment plan. -Discontinue aspirin given risk of  bleeding/anemia -Neurology signed off.   Chronic combined CHF: EF 20-25%, GH, G2DD, dilated IVC with 3x3 cm mass at LV apex suggestive of thrombus.  Appears euvolemic on exam except for lower extremity edema. -Diuretics and cardiac meds held due to hypotension/shock. -Transfused 2 units.  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  No anginal symptoms. -Cardiac meds on hold due to hypotension -Continue metoprolol, Lipitor and aspirin.  Wide-QRS tachycardia: EKG 2/13 showed HR 156, wide QRS 140 ms and QTC 541 ms. LBBB.  -Cardiology on board -Continue digoxin 0.125 mg daily -Continue metoprolol 25 mg BID   Hypotension/history of hypertension: Soft BP but within normal -Cardiac meds as above.  Poorly controlled NIDDM-2 with HHS: A1c 13.3%.  Likely due to noncompliance. Recent Labs  Lab 06/18/23 0713 06/18/23 1110 06/18/23 1505 06/18/23 2145 06/19/23 0722  GLUCAP 143* 159* 299* 237* 219*  -Increase Semglee from 12 to 15 units daily starting 2/18.  Has already received 12 units today -Increase SSI from sensitive to moderate scale -Further adjustment as appropriate.  AKI: Baseline Cr about 1.0.  Resolving. Recent Labs    06/13/23 1939 06/13/23 1959 06/13/23 2228 06/14/23 0452 06/14/23 1255 06/15/23 0414 06/16/23 0404 06/17/23 0654 06/18/23 0321 06/19/23 0351  BUN 19 21 20 20 20 17 16 20 20 23   CREATININE 1.98* 1.90* 1.80* 1.84* 1.51* 1.25* 1.26* 1.36* 1.22 1.28*  -Avoid nephrotoxic meds -Continue holding cardiac meds -Avoid hypotension   Normocytic Anemia: H&H slowly trending down.  No report of melena or hematochezia.  He also have thrombocytopenia.  Patient was on aspirin and heparin.  No nutritional deficiency on anemia panel. Recent Labs    10/14/22 0808 10/14/22 0809 06/13/23 1940  06/13/23 1958 06/13/23 1959 06/15/23 0414 06/16/23 0404 06/17/23 0200 06/18/23 0321 06/19/23 0351  HGB 10.9* 11.2* 8.9* 10.2* 10.2* 8.1* 8.4* 7.8* 7.8* 7.3*  -Discontinue aspirin -Transfused 2 units.  Verbally consented for transfusion -Monitor H&H.  Goal Hgb > 8.0 given history of CAD  AGMA/lactic acidosis: -Continue p.o. sodium bicarbonate.  Thrombocytopenia: Likely due to sepsis.  Relatively stable now -Continue monitoring -Discontinue low-dose aspirin   Elevated TSH and T4: Unreliable in acute setting. -Recommend rechecking thyroid panel in 3 to 4 weeks  Hyponatremia: Mild -Monitor  Physical deconditioning -PT/OT Body mass index is 25.03 kg/m.           DVT prophylaxis:  SCDs Start: 06/13/23 2205 On full dose anticoagulation Code Status: Full code Family Communication: None at bedside Level of care: Progressive Status is: Inpatient Remains inpatient appropriate because: Septic shock, osteomyelitis, AKI and anemia   Final disposition: SNF Consultants:  Pulmonology Neurology Cardiology Orthopedic surgery  55 minutes with more than 50% spent in reviewing records, counseling patient/family and coordinating care.   Sch Meds:  Scheduled Meds:  sodium chloride   Intravenous Once   atorvastatin  80 mg Oral Daily   Chlorhexidine Gluconate Cloth  6 each Topical Daily   digoxin  0.125 mg Oral Daily   furosemide  20 mg Intravenous Once   insulin aspart  0-15 Units Subcutaneous TID WC   insulin aspart  0-5 Units Subcutaneous QHS   [  START ON 06/20/2023] insulin glargine-yfgn  15 Units Subcutaneous Daily   metoprolol tartrate  25 mg Oral BID   metroNIDAZOLE  500 mg Oral Q12H   pantoprazole  40 mg Oral Daily   sodium bicarbonate  650 mg Oral TID   sodium chloride flush  10-40 mL Intracatheter Q12H   Continuous Infusions:  ceFEPime (MAXIPIME) IV Stopped (06/19/23 0216)   heparin 1,850 Units/hr (06/19/23 0800)   vancomycin Stopped (06/19/23 0002)   PRN  Meds:.acetaminophen **OR** acetaminophen, ondansetron (ZOFRAN) IV, mouth rinse, sodium chloride flush  Antimicrobials: Anti-infectives (From admission, onward)    Start     Dose/Rate Route Frequency Ordered Stop   06/15/23 2200  vancomycin (VANCOREADY) IVPB 1500 mg/300 mL        1,500 mg 150 mL/hr over 120 Minutes Intravenous Every 24 hours 06/15/23 1417     06/15/23 1845  ceFEPIme (MAXIPIME) 2 g in sodium chloride 0.9 % 100 mL IVPB        2 g 200 mL/hr over 30 Minutes Intravenous Every 8 hours 06/15/23 1445     06/14/23 1543  vancomycin variable dose per unstable renal function (pharmacist dosing)  Status:  Discontinued         Does not apply See admin instructions 06/14/23 1543 06/16/23 0709   06/14/23 1045  ceFEPIme (MAXIPIME) 2 g in sodium chloride 0.9 % 100 mL IVPB  Status:  Discontinued        2 g 200 mL/hr over 30 Minutes Intravenous Every 8 hours 06/14/23 0955 06/14/23 0956   06/14/23 1045  metroNIDAZOLE (FLAGYL) tablet 500 mg        500 mg Oral Every 12 hours 06/14/23 0955     06/14/23 1045  ceFEPIme (MAXIPIME) 2 g in sodium chloride 0.9 % 100 mL IVPB  Status:  Discontinued        2 g 200 mL/hr over 30 Minutes Intravenous Every 12 hours 06/14/23 0956 06/15/23 1445   06/14/23 1000  vancomycin (VANCOREADY) IVPB 1250 mg/250 mL  Status:  Discontinued        1,250 mg 166.7 mL/hr over 90 Minutes Intravenous Every 24 hours 06/14/23 0638 06/14/23 1545   06/14/23 0700  vancomycin (VANCOREADY) IVPB 1500 mg/300 mL        1,500 mg 150 mL/hr over 120 Minutes Intravenous  Once 06/14/23 0611 06/14/23 1938   06/14/23 0130  azithromycin (ZITHROMAX) 500 mg in sodium chloride 0.9 % 250 mL IVPB  Status:  Discontinued        500 mg 250 mL/hr over 60 Minutes Intravenous Daily at bedtime 06/14/23 0100 06/14/23 0611   06/14/23 0130  cefTRIAXone (ROCEPHIN) 1 g in sodium chloride 0.9 % 100 mL IVPB  Status:  Discontinued        1 g 200 mL/hr over 30 Minutes Intravenous Daily at bedtime 06/14/23 0100  06/14/23 0611        I have personally reviewed the following labs and images: CBC: Recent Labs  Lab 06/13/23 1940 06/13/23 1958 06/15/23 0414 06/16/23 0404 06/17/23 0200 06/18/23 0321 06/19/23 0351  WBC 21.8*  --  15.2* 12.6* 11.1* 12.0* 8.0  NEUTROABS 20.5*  --   --   --   --   --   --   HGB 8.9*   < > 8.1* 8.4* 7.8* 7.8* 7.3*  HCT 27.9*   < > 25.1* 26.1* 24.7* 24.1* 23.4*  MCV 87.5  --  85.4 85.3 86.4 86.1 87.0  PLT 195  --  127* 118* 122*  114* 110*   < > = values in this interval not displayed.   BMP &GFR Recent Labs  Lab 06/14/23 0452 06/14/23 1255 06/15/23 0414 06/16/23 0404 06/16/23 2031 06/17/23 0654 06/18/23 0321 06/19/23 0351  NA 130*   < > 135 131*  --  131* 133* 133*  K 3.9   < > 3.4* 4.5  --  4.5 3.9 4.4  CL 99   < > 105 104  --  107 108 110  CO2 13*   < > 20* 19*  --  18* 15* 17*  GLUCOSE 195*   < > 162* 176*  --  149* 91 241*  BUN 20   < > 17 16  --  20 20 23   CREATININE 1.84*   < > 1.25* 1.26*  --  1.36* 1.22 1.28*  CALCIUM 8.0*   < > 7.7* 7.6*  --  7.7* 7.9* 7.6*  MG 2.3  --  2.1 1.7  --  2.0  --  1.8  PHOS 1.6*   < > 2.8 1.6* 3.5 2.8  --  2.1*   < > = values in this interval not displayed.   Estimated Creatinine Clearance: 64.8 mL/min (A) (by C-G formula based on SCr of 1.28 mg/dL (H)). Liver & Pancreas: Recent Labs  Lab 06/13/23 1939 06/14/23 0452 06/19/23 0351  AST 29 26 12*  ALT 27 22 13   ALKPHOS 49 41 37*  BILITOT 0.7 0.6 0.6  PROT 6.7 6.0* 5.6*  ALBUMIN 2.1* 1.8* <1.5*   Recent Labs  Lab 06/13/23 1939  LIPASE 18   Recent Labs  Lab 06/13/23 2031  AMMONIA 23   Diabetic: No results for input(s): "HGBA1C" in the last 72 hours. Recent Labs  Lab 06/18/23 0713 06/18/23 1110 06/18/23 1505 06/18/23 2145 06/19/23 0722  GLUCAP 143* 159* 299* 237* 219*   Cardiac Enzymes: No results for input(s): "CKTOTAL", "CKMB", "CKMBINDEX", "TROPONINI" in the last 168 hours. No results for input(s): "PROBNP" in the last 8760  hours. Coagulation Profile: Recent Labs  Lab 06/14/23 0452  INR 1.7*   Thyroid Function Tests: No results for input(s): "TSH", "T4TOTAL", "FREET4", "T3FREE", "THYROIDAB" in the last 72 hours. Lipid Profile: No results for input(s): "CHOL", "HDL", "LDLCALC", "TRIG", "CHOLHDL", "LDLDIRECT" in the last 72 hours. Anemia Panel: No results for input(s): "VITAMINB12", "FOLATE", "FERRITIN", "TIBC", "IRON", "RETICCTPCT" in the last 72 hours. Urine analysis:    Component Value Date/Time   COLORURINE YELLOW 05/03/2020 1303   APPEARANCEUR CLEAR 05/03/2020 1303   LABSPEC 1.022 05/03/2020 1303   PHURINE 6.0 05/03/2020 1303   GLUCOSEU >=500 (A) 05/03/2020 1303   HGBUR NEGATIVE 05/03/2020 1303   BILIRUBINUR NEGATIVE 05/03/2020 1303   KETONESUR 80 (A) 05/03/2020 1303   PROTEINUR NEGATIVE 05/03/2020 1303   NITRITE NEGATIVE 05/03/2020 1303   LEUKOCYTESUR NEGATIVE 05/03/2020 1303   Sepsis Labs: Invalid input(s): "PROCALCITONIN", "LACTICIDVEN"  Microbiology: Recent Results (from the past 240 hours)  Resp panel by RT-PCR (RSV, Flu A&B, Covid) Anterior Nasal Swab     Status: None   Collection Time: 06/13/23  8:14 PM   Specimen: Anterior Nasal Swab  Result Value Ref Range Status   SARS Coronavirus 2 by RT PCR NEGATIVE NEGATIVE Final   Influenza A by PCR NEGATIVE NEGATIVE Final   Influenza B by PCR NEGATIVE NEGATIVE Final    Comment: (NOTE) The Xpert Xpress SARS-CoV-2/FLU/RSV plus assay is intended as an aid in the diagnosis of influenza from Nasopharyngeal swab specimens and should not be used as a  sole basis for treatment. Nasal washings and aspirates are unacceptable for Xpert Xpress SARS-CoV-2/FLU/RSV testing.  Fact Sheet for Patients: BloggerCourse.com  Fact Sheet for Healthcare Providers: SeriousBroker.it  This test is not yet approved or cleared by the [place] FDA and has been authorized for detection and/or diagnosis of  SARS-CoV-2 by FDA under an Emergency Use Authorization (EUA). This EUA will remain in effect (meaning this test can be used) for the duration of the COVID-19 declaration under Section 564(b)(1) of the Act, 21 U.S.C. section 360bbb-3(b)(1), unless the authorization is terminated or revoked.     Resp Syncytial Virus by PCR NEGATIVE NEGATIVE Final    Comment: (NOTE) Fact Sheet for Patients: BloggerCourse.com  Fact Sheet for Healthcare Providers: SeriousBroker.it  This test is not yet approved or cleared by the [place] FDA and has been authorized for detection and/or diagnosis of SARS-CoV-2 by FDA under an Emergency Use Authorization (EUA). This EUA will remain in effect (meaning this test can be used) for the duration of the COVID-19 declaration under Section 564(b)(1) of the Act, 21 U.S.C. section 360bbb-3(b)(1), unless the authorization is terminated or revoked.  Performed at [facility], [address] [place], [name] [address]   Culture, blood (Routine X 2) w Reflex to ID Panel     Status: None   Collection Time: 06/14/23  4:52 AM   Specimen: BLOOD  Result Value Ref Range Status   Specimen Description BLOOD RIGHT ANTECUBITAL  Final   Special Requests   Final    BOTTLES DRAWN AEROBIC AND ANAEROBIC Blood Culture results may not be optimal due to an inadequate volume of blood received in culture bottles   Culture   Final    NO GROWTH 5 DAYS Performed at [facility], [address], [place], [name] [address]    Report Status 06/19/2023 FINAL  Final  Culture, blood (Routine X 2) w Reflex to ID Panel     Status: None   Collection Time: 06/14/23  5:00 AM   Specimen: BLOOD  Result Value Ref Range Status   Specimen Description BLOOD RIGHT ANTECUBITAL  Final   Special Requests   Final    BOTTLES DRAWN AEROBIC AND ANAEROBIC Blood Culture results may not be optimal due to an inadequate volume of blood received  in culture bottles   Culture   Final    NO GROWTH 5 DAYS Performed at [facility], [address], [place], [name] [address]    Report Status 06/19/2023 FINAL  Final  MRSA Next Gen by PCR, Nasal     Status: None   Collection Time: 06/14/23  5:15 AM   Specimen: Nasal Mucosa; Nasal Swab  Result Value Ref Range Status   MRSA by PCR Next Gen NOT DETECTED NOT DETECTED Final    Comment: (NOTE) The GeneXpert MRSA Assay (FDA approved for NASAL specimens only), is one component of a comprehensive MRSA colonization surveillance program. It is not intended to diagnose MRSA infection nor to guide or monitor treatment for MRSA infections. Test performance is not FDA approved in patients less than 55 years old. Performed at [facility], [address] [place], [name] [address]     Radiology Studies: No results found.    Kohei Antonellis T. Somaly Marteney [facility]  If 7PM-7AM, please contact night-coverage [email or web address] 06/19/2023, 9:07 AM

## 2023-06-19 NOTE — Progress Notes (Signed)
Occupational Therapy Treatment Patient Details Name: Derek Thompson MRN: [id number] DOB: May 15, 1960 Today's Date: 06/19/2023   History of present illness Pt is a 63 y/o M presenting to Ed on 2/11 with 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 PMH: poorly controlled DM2, chronic diastolic CHF, chronic R sided heart failure, anemia of chronic disease, L bundle branch block   OT comments  Patient with marked improvement since evaluation, though remains limited due to activity tolerance. Patient able to ambulate around the bed with min A, and taking 2 standing rest breaks to complete. Patient also noting bilateral tingling in hands, and difficulty grasping utensils. OT to provide red foam handles next session to promote increased independence. Surgery pending for 2/19 for R foot, with OT reassessing after to ensure continued progress. OT recommendation remains appropriate, however may be able to transition to home health services if patient continues to progress.       If plan is discharge home, recommend the following:  A lot of help with walking and/or transfers;A lot of help with bathing/dressing/bathroom;Assistance with cooking/housework;Direct supervision/assist for medications management;Direct supervision/assist for financial management;Assist for transportation;Help with stairs or ramp for entrance   Equipment Recommendations  Other (comment) (defer to next venue)    Recommendations for Other Services      Precautions / Restrictions Precautions Precautions: Fall Restrictions Weight Bearing Restrictions Per Provider Order: No       Mobility Bed Mobility Overal bed mobility: Needs Assistance Bed Mobility: Sit to Supine       Sit to supine: Min assist        Transfers Overall transfer level: Needs assistance Equipment used: Rolling walker (2 wheels) Transfers: Sit to/from Stand Sit to Stand: Min assist            General transfer comment: posterior lean     Balance Overall balance assessment: Needs assistance Sitting-balance support: No upper extremity supported, Feet supported Sitting balance-Leahy Scale: Fair     Standing balance support: Bilateral upper extremity supported, Reliant on assistive device for balance Standing balance-Leahy Scale: Poor                             ADL either performed or assessed with clinical judgement   ADL Overall ADL's : Needs assistance/impaired     Grooming: Set up               Lower Body Dressing: Maximal assistance   Toilet Transfer: Minimal assistance;Ambulation;Rolling walker (2 wheels)   Toileting- Clothing Manipulation and Hygiene: Total assistance;Sit to/from stand;Sitting/lateral lean Toileting - Clothing Manipulation Details (indicate cue type and reason): unaware of BM, requiring total care in standing     Functional mobility during ADLs: Minimal assistance;Cueing for sequencing;Cueing for safety;Rolling walker (2 wheels) General ADL Comments: Patient with marked improvement since evaluation, though remains limited due to activity tolerance. Patient able to ambulate around the bed with min A, and taking 2 standing rest breaks to complete. Patient also noting bilateral tingling in hands, and difficulty grasping utensils. OT to provide red foam handles next session to promote increased independence. Surgery pending for 2/19 for R foot, with OT reassessing after to ensure continued progress. OT recommendation remains appropriate, however may be able to transition to home health services if patient continues to progress.    Extremity/Trunk Assessment              Vision  Perception     Praxis     Communication Communication Communication: No apparent difficulties   Cognition Arousal: Alert Behavior During Therapy: Flat affect                                 Following commands: Intact         Cueing   Cueing Techniques: Verbal cues  Exercises      Shoulder Instructions       General Comments VSS on RA    Pertinent Vitals/ Pain       Pain Assessment Pain Assessment: No/denies pain  Home Living                                          Prior Functioning/Environment              Frequency  Min 1X/week        Progress Toward Goals  OT Goals(current goals can now be found in the care plan section)     Acute Rehab OT Goals Patient Stated Goal: to get better OT Goal Formulation: With patient Time For Goal Achievement: 06/28/23 Potential to Achieve Goals: Good  Plan      Co-evaluation      Reason for Co-Treatment: For patient/therapist safety;To address functional/ADL transfers PT goals addressed during session: Mobility/safety with mobility;Balance;Proper use of DME;Strengthening/ROM        AM-PAC OT "6 Clicks" Daily Activity     Outcome Measure   Help from another person eating meals?: A Little Help from another person taking care of personal grooming?: A Little Help from another person toileting, which includes using toliet, bedpan, or urinal?: A Lot Help from another person bathing (including washing, rinsing, drying)?: A Lot Help from another person to put on and taking off regular upper body clothing?: A Little Help from another person to put on and taking off regular lower body clothing?: A Lot 6 Click Score: 15    End of Session Equipment Utilized During Treatment: Gait belt;Rolling walker (2 wheels)  OT Visit Diagnosis: Unsteadiness on feet (R26.81);Other abnormalities of gait and mobility (R26.89);Muscle weakness (generalized) (M62.81)   Activity Tolerance Patient tolerated treatment well   Patient Left in bed;with call bell/phone within reach;with bed alarm set   Nurse Communication Mobility status        Time: [id number] OT Time Calculation (min): 23 min  Charges: OT General Charges $OT Visit: 1  Visit OT Treatments $Self Care/Home Management : 8-22 mins  Pollyann Glen E. Allessandra Bernardi, OTR/L Acute Rehabilitation Services [phone]   Cherlyn Cushing 06/19/2023, 2:27 PM

## 2023-06-19 NOTE — Progress Notes (Signed)
PHARMACY - ANTICOAGULATION CONSULT NOTE  Pharmacy Consult for IV heparin Indication: LV thrombus + thromboembolic vs ischemic stroke   No Known Allergies  Patient Measurements: Height: 6' (182.9 cm) Weight: 83.7 kg (184 lb 8.4 oz) IBW/kg (Calculated) : 77.6 Heparin Dosing Weight: Actual body weight  Vital Signs: Temp: 97.7 F (36.5 C) (02/17 0723) Temp Source: Oral (02/17 0723) BP: 118/70 (02/17 0800) Pulse Rate: 77 (02/17 0800)  Labs: Recent Labs    06/17/23 0200 06/17/23 0654 06/17/23 1002 06/18/23 0321 06/18/23 1849 06/19/23 0351  HGB 7.8*  --   --  7.8*  --  7.3*  HCT 24.7*  --   --  24.1*  --  23.4*  PLT 122*  --   --  114*  --  110*  HEPARINUNFRC 0.32  --    < > 0.56 0.55 0.50  CREATININE  --  1.36*  --  1.22  --  1.28*   < > = values in this interval not displayed.    Estimated Creatinine Clearance: 64.8 mL/min (A) (by C-G formula based on SCr of 1.28 mg/dL (H)).  Assessment: Derek Thompson is a 63 y.o M w/ PMHx of poorly controlled T2DM, HFrEF (LVEF 25%), LBBB, G2DD and LV global hypokinesis, HTN, CAD, Prostate Cancer treated w/ radiation 2022 who presented to ED for 1 week of progressive generalized weakness, 2 days AMS, admitted for suspected HHS. 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 Note patient also with acute/subacute infarct in R frontal lobe, with suspicion for embolic etiology.  AM heparin level therapeutic at 0.50 while on heparin 1850u/hr. HgB down to 7.3 and PLTS 110 suspected due to anemia, no bleeding noted.   Goal of Therapy:  Heparin level 0.3-0.5 units/ml  (targeting low end for concominant CVA/ w/ petechial hemorrhage) Monitor platelets by anticoagulation protocol: Yes   Plan:  Slight decrease to 1800 units/h Daily and PRN heparin level Monitor daily CBC, s/sx bleeding F/u transition to Eliquis after surgical intervention with ortho on 2/19.   Thank you for allowing pharmacy  to participate in this patient's care,  Estill Batten, PharmD, [facility]  06/19/2023 9:11 AM

## 2023-06-19 NOTE — Progress Notes (Signed)
Cellphone glasses and clothing and shoes sent with patient to [phone] and patient informed family of transfer

## 2023-06-19 NOTE — Progress Notes (Signed)
Rounding Note    Patient Name: Derek Thompson Date of Encounter: 06/19/2023  [facility] Cardiologist: [name], MD  Chief Complaint: Weakness Reason of consult: Evaluation of large LV apex thrombus  Subjective   Sitting in bed upright. Denies anginal chest pain or heart failure symptoms. In the process of being transferred to medical floor from ICU  Inpatient Medications    Scheduled Meds:  sodium chloride   Intravenous Once   atorvastatin  80 mg Oral Daily   Chlorhexidine Gluconate Cloth  6 each Topical Daily   digoxin  0.125 mg Oral Daily   furosemide  20 mg Intravenous Once   insulin aspart  0-15 Units Subcutaneous TID WC   insulin aspart  0-5 Units Subcutaneous QHS   [START ON 06/20/2023] insulin glargine-yfgn  15 Units Subcutaneous Daily   metoprolol tartrate  25 mg Oral BID   metroNIDAZOLE  500 mg Oral Q12H   pantoprazole  40 mg Oral Daily   sodium bicarbonate  650 mg Oral TID   sodium chloride flush  10-40 mL Intracatheter Q12H   Continuous Infusions:  ceFEPime (MAXIPIME) IV 2 g (06/19/23 0916)   heparin 1,800 Units/hr (06/19/23 0931)   vancomycin Stopped (06/19/23 0002)   PRN Meds: acetaminophen **OR** acetaminophen, ondansetron (ZOFRAN) IV, mouth rinse, sodium chloride flush   Vital Signs    Vitals:   06/19/23 0600 06/19/23 0700 06/19/23 0723 06/19/23 0800  BP: 106/62 107/61  118/70  Pulse: 73 71 75 77  Resp: 17 16 (!) 21 (!) 21  Temp:   97.7 F (36.5 C)   TempSrc:   Oral   SpO2: 100% 100% 100% 100%  Weight:      Height:        Intake/Output Summary (Last 24 hours) at 06/19/2023 0935 Last data filed at 06/19/2023 0857 Gross per 24 hour  Intake 1628.69 ml  Output 1600 ml  Net 28.69 ml   Net IO Since Admission: 1,401.52 mL [06/19/23 0935]     06/19/2023    4:07 AM 06/16/2023    5:00 AM 06/15/2023    5:00 AM  Last 3 Weights  Weight (lbs) 184 lb 8.4 oz 170 lb 13.7 oz 170 lb 13.7 oz  Weight (kg) 83.7 kg 77.5 kg 77.5 kg       Telemetry    Sinus rhythm, rare PVCs, 2 episodes of NSVT (nocturnal events)- Personally Reviewed  ECG    No new EKGs- Personally Reviewed  Physical Exam   Physical Exam  Constitutional:  Age appropriate, hemodynamically stable, no acute distress.   Neck: No JVD present.  Left IJ central line  Cardiovascular: Normal rate, regular rhythm, S1 normal and S2 normal. Exam reveals decreased pulses. Exam reveals no gallop and no friction rub.  Murmur heard. Holosystolic murmur is present with a grade of 3/6 at the apex. Pulmonary/Chest: Breath sounds normal. He has no wheezes. He has no rales. He exhibits no tenderness.  Abdominal: Soft. Bowel sounds are normal. He exhibits no distension. There is no abdominal tenderness.  Musculoskeletal:        General: Edema (Trace bilateral) present. No tenderness.     Comments: Right foot, dressing recently applied.  Skin: Skin is warm and dry.    Labs    High Sensitivity Troponin:   Recent Labs  Lab 06/14/23 1405  TROPONINIHS 66*     Chemistry Recent Labs  Lab 06/13/23 1939 06/13/23 1958 06/14/23 4098 06/14/23 1255 06/16/23 0404 06/17/23 0654 06/18/23 0321 06/19/23 0351  NA 126*   < > 130*   < > 131* 131* 133* 133*  K 4.1   < > 3.9   < > 4.5 4.5 3.9 4.4  CL 91*   < > 99   < > 104 107 108 110  CO2 19*   < > 13*   < > 19* 18* 15* 17*  GLUCOSE 545*   < > 195*   < > 176* 149* 91 241*  BUN 19   < > 20   < > 16 20 20 23   CREATININE 1.98*   < > 1.84*   < > 1.26* 1.36* 1.22 1.28*  CALCIUM 8.5*   < > 8.0*   < > 7.6* 7.7* 7.9* 7.6*  MG  --    < > 2.3   < > 1.7 2.0  --  1.8  PROT 6.7  --  6.0*  --   --   --   --  5.6*  ALBUMIN 2.1*  --  1.8*  --   --   --   --  <1.5*  AST 29  --  26  --   --   --   --  12*  ALT 27  --  22  --   --   --   --  13  ALKPHOS 49  --  41  --   --   --   --  37*  BILITOT 0.7  --  0.6  --   --   --   --  0.6  GFRNONAA 37*   < > 41*   < > >60 58* >60 >60  ANIONGAP 16*   < > 18*   < > 8 6 10 6    < > = values  in this interval not displayed.    Lipids  Recent Labs  Lab 06/15/23 0414  CHOL 85  TRIG 131  HDL 10*  LDLCALC 49  CHOLHDL 8.5    Hematology Recent Labs  Lab 06/17/23 0200 06/18/23 0321 06/19/23 0351  WBC 11.1* 12.0* 8.0  RBC 2.86* 2.80* 2.69*  HGB 7.8* 7.8* 7.3*  HCT 24.7* 24.1* 23.4*  MCV 86.4 86.1 87.0  MCH 27.3 27.9 27.1  MCHC 31.6 32.4 31.2  RDW 17.5* 17.9* 18.0*  PLT 122* 114* 110*   Thyroid  Recent Labs  Lab 06/13/23 1939 06/15/23 0414  TSH 5.642*  --   FREET4  --  1.38*    BNP Recent Labs  Lab 06/13/23 1937  BNP >4,500.0*    DDimer No results for input(s): "DDIMER" in the last 168 hours.   Radiology    NA  Cardiac Studies   Echo 06/14/2023  1. Large 3 x 3 cm round heterogenous mass at the LV apex which is  suggestive of thrombus given severe LV dysfunction and less likely myxoma  - the mass did not take up Definity contrast (appears avascular). Left  ventricular ejection fraction, by  estimation, is 20 to 25%. Left ventricular ejection fraction by 2D MOD  biplane is 21.2 %. The left ventricle has severely decreased function. The  left ventricle demonstrates global hypokinesis. The left ventricular  internal cavity size was mildly  dilated. Left ventricular diastolic parameters are consistent with Grade  II diastolic dysfunction (pseudonormalization). Elevated left ventricular  end-diastolic pressure.   2. Right ventricular systolic function is mildly reduced. The right  ventricular size is normal. There is mildly elevated pulmonary artery  systolic pressure. The estimated right ventricular systolic  pressure is  39.0 mmHg.   3. The mitral valve is abnormal. Moderate mitral valve regurgitation.   4. The aortic valve is tricuspid. Aortic valve regurgitation is not  visualized.   5. The inferior vena cava is dilated in size with <50% respiratory  variability, suggesting right atrial pressure of 15 mmHg.   Comparison(s): Changes from prior  study are noted. 08/16/2022: LVEF 25%,  global hypokinesis.   Conclusion(s)/Recommendation(s): Critical findings reported to Dr. Francine Graven  and Elmer Picker, NP and acknowledged at 12:23 pm on 06/14/23.   Patient Profile     63 y.o. 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 Francine Graven.   Assessment & Plan    Chronic HFrEF - 08/2022 echo: LVEF 25% - 10/2022 cath: nonobstrutive CAD, CI 2.87, PCWP 15, PA mean 19 - 06/2023 echo: LVEF 20-25%, grade II dd, mild RV dysfunction, mod MR Net IO Since Admission: 1,401.52 mL [06/19/23 0935] Check BNP, add on Continue aspirin 81 mg p.o. daily. Continue atorvastatin 80 mg p.o. daily. Continue digoxin 125 mcg p.o. daily. Continue Lasix 20 mg IV push x 1 between blood transfusions. Continue Lopressor 25 mg p.o. twice daily Postsurgery would like to transition him from Lopressor to Toprol-XL, add low-dose ARB/Arni, and SGLT2 inhibitors.  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 Currently on IV heparin drip.  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  NSVT: Asymptomatic, noted on telemetry, currently on AV nodal blocking agents, has undergone ischemic workup in the recent past.  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 Cardio embolic secondary to LV thrombus. Currently on IV heparin gtt. Seen by neurology.  Primary following.   Preoperative risk stratification: Is tentatively being scheduled for a second ray amputation.  Overall optimized from a cardiovascular standpoint.  But remains at elevated risk given his HFrEF, newly discovered LV thrombus, recent septic shock due to osteomyelitis/wet gangrene, acute/early subacute CVA, insulin-dependent diabetes poorly controlled.  Patient understands his risk profile.  Surgery is not prohibitive.  IDDM: Poorly controlled diabetic, with hemoglobin A1c 13.3, currently on insulin  HLD: LDL at goal. Continue Lipitor 80 mg po at bedtime  HTN: BP stable. Medications as noted above.   Diabetic right second toe infection.  Seen by vascular and orthopedic surgery  Tentatively planning OR on Wednesday  Non compliance w/ medical therapy.  Decided to stop insulin tx prior to arrival - per EMR.      For questions or updates, please contact [facility] Please consult [email or web address] for contact info under     Signed, Tessa Lerner, DO, [facility]  [facility]  [address] [place], [name] [address] Pager: [phone] Office: [phone] 06/19/2023, 9:35 AM

## 2023-06-19 NOTE — Plan of Care (Signed)

## 2023-06-20 ENCOUNTER — Telehealth (HOSPITAL_COMMUNITY): Payer: Self-pay | Admitting: Pharmacy Technician

## 2023-06-20 ENCOUNTER — Other Ambulatory Visit (HOSPITAL_COMMUNITY): Payer: Self-pay

## 2023-06-20 DIAGNOSIS — I240 Acute coronary thrombosis not resulting in myocardial infarction: Secondary | ICD-10-CM

## 2023-06-20 DIAGNOSIS — Z91199 Patient's noncompliance with other medical treatment and regimen due to unspecified reason: Secondary | ICD-10-CM

## 2023-06-20 DIAGNOSIS — R6521 Severe sepsis with septic shock: Secondary | ICD-10-CM

## 2023-06-20 DIAGNOSIS — I4729 Other ventricular tachycardia: Secondary | ICD-10-CM | POA: Diagnosis not present

## 2023-06-20 DIAGNOSIS — E1165 Type 2 diabetes mellitus with hyperglycemia: Secondary | ICD-10-CM

## 2023-06-20 DIAGNOSIS — E782 Mixed hyperlipidemia: Secondary | ICD-10-CM

## 2023-06-20 DIAGNOSIS — Z01810 Encounter for preprocedural cardiovascular examination: Secondary | ICD-10-CM

## 2023-06-20 DIAGNOSIS — I10 Essential (primary) hypertension: Secondary | ICD-10-CM

## 2023-06-20 DIAGNOSIS — A419 Sepsis, unspecified organism: Secondary | ICD-10-CM

## 2023-06-20 DIAGNOSIS — I5022 Chronic systolic (congestive) heart failure: Secondary | ICD-10-CM | POA: Diagnosis not present

## 2023-06-20 LAB — CBC
HCT: 30.5 % — ABNORMAL LOW (ref 39.0–52.0)
Hemoglobin: 9.6 g/dL — ABNORMAL LOW (ref 13.0–17.0)
MCH: 26.6 pg (ref 26.0–34.0)
MCHC: 31.5 g/dL (ref 30.0–36.0)
MCV: 84.5 fL (ref 80.0–100.0)
Platelets: 124 10*3/uL — ABNORMAL LOW (ref 150–400)
RBC: 3.61 MIL/uL — ABNORMAL LOW (ref 4.22–5.81)
RDW: 20.1 % — ABNORMAL HIGH (ref 11.5–15.5)
WBC: 7.4 10*3/uL (ref 4.0–10.5)
nRBC: 0 % (ref 0.0–0.2)

## 2023-06-20 LAB — GLUCOSE, CAPILLARY
Glucose-Capillary: 151 mg/dL — ABNORMAL HIGH (ref 70–99)
Glucose-Capillary: 117 mg/dL — ABNORMAL HIGH (ref 70–99)
Glucose-Capillary: 230 mg/dL — ABNORMAL HIGH (ref 70–99)
Glucose-Capillary: 281 mg/dL — ABNORMAL HIGH (ref 70–99)
Glucose-Capillary: 150 mg/dL — ABNORMAL HIGH (ref 70–99)

## 2023-06-20 LAB — BPAM RBC
Blood Product Expiration Date: 202503122359
Blood Product Expiration Date: 202503142359
ISSUE DATE / TIME: 202502171113
ISSUE DATE / TIME: 202502171424
Unit Type and Rh: 7300
Unit Type and Rh: 7300

## 2023-06-20 LAB — RENAL FUNCTION PANEL
Albumin: 1.6 g/dL — ABNORMAL LOW (ref 3.5–5.0)
Anion gap: 9 (ref 5–15)
BUN: 20 mg/dL (ref 8–23)
CO2: 16 mmol/L — ABNORMAL LOW (ref 22–32)
Calcium: 7.8 mg/dL — ABNORMAL LOW (ref 8.9–10.3)
Chloride: 110 mmol/L (ref 98–111)
Creatinine, Ser: 1.24 mg/dL (ref 0.61–1.24)
GFR, Estimated: >60 mL/min (ref >60)
Glucose, Bld: 154 mg/dL — ABNORMAL HIGH (ref 70–99)
Phosphorus: 2.1 mg/dL — ABNORMAL LOW (ref 2.5–4.6)
Potassium: 4.2 mmol/L (ref 3.5–5.1)
Sodium: 135 mmol/L (ref 135–145)

## 2023-06-20 LAB — MAGNESIUM: Magnesium: 1.7 mg/dL (ref 1.7–2.4)

## 2023-06-20 LAB — TYPE AND SCREEN
ABO/RH(D): B POS
Antibody Screen: NEGATIVE
Unit division: 0
Unit division: 0

## 2023-06-20 LAB — HEPARIN LEVEL (UNFRACTIONATED)
Heparin Unfractionated: 0.62 [IU]/mL (ref 0.30–0.70)
Heparin Unfractionated: 0.48 [IU]/mL (ref 0.30–0.70)

## 2023-06-20 MED ORDER — INSULIN STARTER KIT- PEN NEEDLES (ENGLISH)
1.0000 | Freq: Once | Status: DC
  Filled 2023-06-20: qty 1

## 2023-06-20 MED ORDER — LIVING WELL WITH DIABETES BOOK
Freq: Once | Status: DC
  Filled 2023-06-20: qty 1

## 2023-06-20 MED ORDER — ENSURE ENLIVE PO LIQD
237.0000 mL | Freq: Two times a day (BID) | ORAL | Status: DC
  Administered 2023-06-20 – 2023-06-26 (×11): 237 mL via ORAL

## 2023-06-20 MED ORDER — TAMSULOSIN HCL 0.4 MG PO CAPS
0.4000 mg | ORAL_CAPSULE | Freq: Every day | ORAL | Status: DC
  Administered 2023-06-20 – 2023-06-29 (×9): 0.4 mg via ORAL
  Filled 2023-06-20 (×10): qty 1

## 2023-06-20 MED ORDER — ADULT MULTIVITAMIN W/MINERALS CH
1.0000 | ORAL_TABLET | Freq: Every day | ORAL | Status: DC
  Administered 2023-06-20 – 2023-06-29 (×9): 1 via ORAL
  Filled 2023-06-20 (×10): qty 1

## 2023-06-20 MED ORDER — SODIUM PHOSPHATES 45 MMOLE/15ML IV SOLN
30.0000 mmol | Freq: Once | INTRAVENOUS | Status: AC
  Administered 2023-06-20: 30 mmol via INTRAVENOUS
  Filled 2023-06-20: qty 10

## 2023-06-20 MED ORDER — MAGNESIUM SULFATE 2 GM/50ML IV SOLN
2.0000 g | Freq: Once | INTRAVENOUS | Status: AC
  Administered 2023-06-20: 2 g via INTRAVENOUS
  Filled 2023-06-20: qty 50

## 2023-06-20 MED ORDER — METOPROLOL TARTRATE 25 MG PO TABS
25.0000 mg | ORAL_TABLET | Freq: Three times a day (TID) | ORAL | Status: DC
  Administered 2023-06-20 – 2023-06-21 (×4): 25 mg via ORAL
  Filled 2023-06-20 (×6): qty 1

## 2023-06-20 MED ORDER — FUROSEMIDE 10 MG/ML IJ SOLN
40.0000 mg | Freq: Every day | INTRAMUSCULAR | Status: DC
  Administered 2023-06-20: 40 mg via INTRAVENOUS
  Filled 2023-06-20 (×3): qty 4

## 2023-06-20 NOTE — Progress Notes (Signed)
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 Denies chest pain, shortness of breath, orthopnea, PND  Inpatient Medications    Scheduled Meds:  atorvastatin  80 mg Oral Daily   Chlorhexidine Gluconate Cloth  6 each Topical Daily   digoxin  0.125 mg Oral Daily   furosemide  40 mg Intravenous Daily   insulin aspart  0-15 Units Subcutaneous TID WC   insulin aspart  0-5 Units Subcutaneous QHS   insulin glargine-yfgn  15 Units Subcutaneous Daily   insulin starter kit- pen needles  1 kit Other Once   living well with diabetes book   Does not apply Once   metoprolol tartrate  25 mg Oral TID   metroNIDAZOLE  500 mg Oral Q12H   pantoprazole  40 mg Oral Daily   sodium bicarbonate  650 mg Oral TID   sodium chloride flush  10-40 mL Intracatheter Q12H   Continuous Infusions:  ceFEPime (MAXIPIME) IV 2 g (06/20/23 0943)   heparin 1,700 Units/hr (06/20/23 0940)   sodium PHOSPHATE IVPB (in mmol) 30 mmol (06/20/23 0949)   vancomycin Stopped (06/19/23 2230)   PRN Meds: acetaminophen **OR** acetaminophen, ondansetron (ZOFRAN) IV, mouth rinse, sodium chloride flush   Vital Signs    Vitals:   06/19/23 2320 06/20/23 0310 06/20/23 0313 06/20/23 0745  BP: 109/67 108/69  121/72  Pulse: 74 77  80  Resp: 16 18  19   Temp: 98.4 F (36.9 C) 98.1 F (36.7 C)  98 F (36.7 C)  TempSrc: Axillary Axillary  Oral  SpO2: 97% 96%    Weight:   87.1 kg   Height:        Intake/Output Summary (Last 24 hours) at 06/20/2023 1048 Last data filed at 06/20/2023 0346 Gross per 24 hour  Intake 346 ml  Output 1150 ml  Net -804 ml   Net IO Since Admission: 835.96 mL [06/20/23 1048]     06/20/2023    3:13 AM 06/19/2023    4:07 AM 06/16/2023    5:00 AM  Last 3 Weights  Weight (lbs) 192 lb  0.3 oz 184 lb 8.4 oz 170 lb 13.7 oz  Weight (kg) 87.1 kg 83.7 kg 77.5 kg      Telemetry    Sinus rhythm with rare episodes of NSVT (asymptomatic, longest episode 4 beats)- Personally Reviewed  ECG    No new EKGs- Personally Reviewed  Physical Exam   Physical Exam  Constitutional:  Age appropriate, hemodynamically stable, no acute distress.   Neck: No JVD present.  Left IJ central line  Cardiovascular: Normal rate, regular rhythm, S1 normal and S2 normal. Exam reveals decreased pulses. Exam reveals no gallop and no friction rub.  Murmur heard. Holosystolic murmur is present with a grade of 3/6 at the apex. Pulmonary/Chest: Breath sounds normal. He has no wheezes. He has no rales. He exhibits no tenderness.  Abdominal: Soft. Bowel sounds are normal. He exhibits no distension. There is no abdominal tenderness.  Musculoskeletal:        General: Edema (Trace bilateral) present. No tenderness.     Comments: Bilateral lower extremity wounds on the phalanges.  Right lower extremity wounds are open, draining, foul-smelling.  Skin: Skin is warm and dry.    Labs    High Sensitivity Troponin:   Recent Labs  Lab 06/14/23 1405  TROPONINIHS 66*     Chemistry Recent Labs  Lab 06/13/23 1939 06/13/23 1958 06/14/23 0452 06/14/23 1255 06/17/23 0654 06/18/23 0321 06/19/23 0351 06/20/23 0355  NA 126*   < > 130*   < > 131* 133* 133* 135  K 4.1   < > 3.9   < > 4.5 3.9 4.4 4.2  CL 91*   < > 99   < > 107 108 110 110  CO2 19*   < > 13*   < > 18* 15* 17* 16*  GLUCOSE 545*   < > 195*   < > 149* 91 241* 154*  BUN 19   < > 20   < > 20 20 23 20   CREATININE 1.98*   < > 1.84*   < > 1.36* 1.22 1.28* 1.24  CALCIUM 8.5*   < > 8.0*   < > 7.7* 7.9* 7.6* 7.8*  MG  --    < > 2.3   < > 2.0  --  1.8 1.7  PROT 6.7  --  6.0*  --   --   --  5.6*  --   ALBUMIN 2.1*  --  1.8*  --   --   --  <1.5* 1.6*  AST 29  --  26  --   --   --  12*  --   ALT 27  --  22  --   --   --  13  --   ALKPHOS 49  --  41  --    --   --  37*  --   BILITOT 0.7  --  0.6  --   --   --  0.6  --   GFRNONAA 37*   < > 41*   < > 58* >60 >60 >60  ANIONGAP 16*   < > 18*   < > 6 10 6 9    < > = values in this interval not displayed.    Lipids  Recent Labs  Lab 06/15/23 0414  CHOL 85  TRIG 131  HDL 10*  LDLCALC 49  CHOLHDL 8.5    Hematology Recent Labs  Lab 06/18/23 0321 06/19/23 0351 06/19/23 2206 06/20/23 0355  WBC 12.0* 8.0  --  7.4  RBC 2.80* 2.69*  --  3.61*  HGB 7.8* 7.3* 9.4* 9.6*  HCT 24.1* 23.4* 29.2* 30.5*  MCV 86.1 87.0  --  84.5  MCH 27.9 27.1  --  26.6  MCHC 32.4 31.2  --  31.5  RDW 17.9* 18.0*  --  20.1*  PLT 114* 110*  --  124*   Thyroid  Recent Labs  Lab 06/13/23 1939 06/15/23 0414  TSH 5.642*  --   FREET4  --  1.38*    BNP Recent Labs  Lab 06/13/23 1937 06/19/23 0351  BNP >4,500.0* 2,164.5*    DDimer No results for input(s): "DDIMER" in the last 168 hours.   Radiology    NA  Cardiac Studies   Echo 06/14/2023  1. Large 3 x 3 cm round heterogenous mass at the LV apex which is  suggestive of thrombus given severe LV dysfunction and less likely myxoma  - the mass did not take up Definity contrast (appears avascular). Left  ventricular ejection fraction, by  estimation, is 20 to 25%. Left ventricular ejection fraction by 2D MOD  biplane is 21.2 %. The left ventricle has severely decreased function. The  left ventricle demonstrates global hypokinesis. The left ventricular  internal cavity size was mildly  dilated. Left ventricular diastolic parameters are consistent with Grade  II diastolic dysfunction (pseudonormalization). Elevated left ventricular  end-diastolic pressure.   2. Right ventricular systolic function is mildly reduced. The right  ventricular size is normal. There is mildly elevated pulmonary artery  systolic pressure. The estimated right ventricular systolic pressure is  39.0 mmHg.   3. The mitral valve is abnormal. Moderate mitral valve regurgitation.    4. The aortic valve is tricuspid. Aortic valve regurgitation is not  visualized.   5. The inferior vena cava is dilated in size with <50% respiratory  variability, suggesting right atrial pressure of 15 mmHg.   Comparison(s): Changes from prior study are noted. 08/16/2022: LVEF 25%,  global hypokinesis.   Conclusion(s)/Recommendation(s): Critical findings reported to Dr. Francine Graven  and Elmer Picker, NP and acknowledged at 12:23 pm on 06/14/23.   Patient Profile     63 y.o. 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 Francine Graven.   Assessment & Plan    Chronic HFrEF - 08/2022 echo: LVEF 25% - 10/2022 cath: nonobstrutive CAD, CI 2.87, PCWP 15, PA mean 19 - 06/2023 echo: LVEF 20-25%, grade II dd, mild RV dysfunction, mod MR Net IO Since Admission: 835.96 mL [06/20/23 1048] Repeat BNP - improving Continue aspirin 81 mg p.o. daily. Continue atorvastatin 80 mg p.o. daily. Continue digoxin 125 mcg p.o. daily. Start Lasix 40 mg IV push daily Increase Lopressor to 25 mg p.o. 3 times daily Postsurgery would like to transition him from Lopressor to Toprol-XL, add low-dose ARB/Arni, and SGLT2 inhibitors.  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 Currently on IV heparin drip. Transition to DOAC post surgery.   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  NSVT: Asymptomatic, noted on telemetry, currently on AV nodal blocking agents, has undergone ischemic workup in the recent past.  Will increase beta-blocker  therapy as discussed above.  Continue telemetry.  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 Cardio embolic secondary to LV thrombus. Currently on IV heparin gtt. Seen by neurology. Primary following.   Preoperative risk stratification: Is tentatively being scheduled for a second ray amputation.  Overall optimized from a cardiovascular standpoint.  But remains at elevated risk given his HFrEF, newly discovered LV thrombus, recent septic shock due to osteomyelitis/wet gangrene, acute/early subacute CVA, insulin-dependent diabetes poorly controlled.  Patient understands his risk profile.  Surgery is not prohibitive.  IDDM: Poorly controlled diabetic, with hemoglobin A1c 13.3, currently on insulin  HLD: LDL at goal. Continue Lipitor 80 mg po at bedtime  HTN: BP stable. Medications as noted above.   Diabetic right second toe infection.  Seen by vascular and orthopedic surgery  Tentatively planning OR on Wednesday  Non compliance w/ medical therapy.  Decided to stop insulin tx prior to arrival - per EMR.      For questions or updates, please contact [facility] Please consult [email or web address] for contact info under     Signed, Tessa Lerner, DO, [facility]  [facility]  [address] [place], [name] [address] Pager: [phone] Office: [phone] 06/20/2023, 10:48 AM

## 2023-06-20 NOTE — Progress Notes (Addendum)
PROGRESS NOTE        PATIENT DETAILS Name: Derek Thompson Age: 63 y.o. Sex: male Date of Birth: 1961/01/21 Admit Date: 06/13/2023 Admitting Physician Angie Fava, DO BMW:UXLKG, Casimiro Needle, DO  Brief Summary: Patient is a 63 y.o.  male with history of chronic HFrEF, DM-2, HTN, prostate cancer-s/p radiation 2022-who presented with generalized weakness/acute metabolic encephalopathy-he was found to have HHS secondary to right diabetic foot (great/second toe) with gangrene/osteomyelitis, LV thrombus and acute CVA.  He was stabilized in the ICU-and subsequently transferred to [facility] on 2/16.  Significant events: 2/12>> admit to [facility]-HHS/encephalopathy/diabetic foot-developed hypotension-started on Levophed-transfer to ICU. 2/14>> RLE angiogram-small vessel disease. 2/16>> transferred to [facility].  Significant studies: 2/11>> A1c: 13.3 2/12>> MRI brain: Acute infarct right frontal lobe, additional small infarct right cerebellum, left frontal/parietal/occipital lobe. 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 2/12>> renal ultrasound: No hydronephrosis 2/12>> echo: EF 20-25%, LV thrombus.  Grade 2 diastolic dysfunction.  Moderate mitral valve regurgitation 2/13>> LDL: 49.  Significant microbiology data: 2/11>> COVID/influenza/RSV PCR: Negative 2/12>> blood culture: Negative.  Procedures: 2/14>> RLE angiogram by vascular surgery-small vessel disease.  Consults: PCCM Orthopedics Cardiology Neurology  Subjective: Lying comfortably in bed-denies any chest pain or shortness of breath.  Objective: Vitals: Blood pressure 121/72, pulse 80, temperature 98 F (36.7 C), temperature source Oral, resp. rate 19, height 6' (1.829 m), weight 87.1 kg, SpO2 96%.   Exam: Gen Exam:Alert awake-not in any distress HEENT:atraumatic, normocephalic Chest: B/L clear to auscultation anteriorly CVS:S1S2  regular Abdomen:soft non tender, non distended Extremities:no edema-foul-smelling/purulent appearing discharge from/second toe-bandage in place. Neurology: Non focal Skin: no rash  Pertinent Labs/Radiology:    Latest Ref Rng & Units 06/20/2023    3:55 AM 06/19/2023   10:06 PM 06/19/2023    3:51 AM  CBC  WBC 4.0 - 10.5 K/uL 7.4   8.0   Hemoglobin 13.0 - 17.0 g/dL 9.6  9.4  7.3   Hematocrit 39.0 - 52.0 % 30.5  29.2  23.4   Platelets 150 - 400 K/uL 124   110     Lab Results  Component Value Date   NA 135 06/20/2023   K 4.2 06/20/2023   CL 110 06/20/2023   CO2 16 (L) 06/20/2023    Assessment/Plan: Septic shock secondary to right diabetic foot with wet gangrene/osteomyelitis involving great/second toe. Sepsis physiology has resolved Afebrile No leukocytosis Continue broad-spectrum antibiotics RLE angiogram with small vessel disease Amputation scheduled for 2/19-Dr. Lajoyce Corners following.  Acute metabolic encephalopathy Suspect this is mostly from sepsis-with some component from CVA Mentation has improved with treatment of underlying issues.  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  Chronic HFrEF Volume status stable Continue beta-blocker/furosemide/digoxin Cardiology planning to add ARNI/SGLT2 inhibitors post surgery  LV thrombus IV heparin with plans to transition to DOAC post surgery  HHS Resolved-treated with IV fluids/IV insulin  AKI Likely hemodynamically mediated Resolved  DM-2 with poor control/uncontrolled hyperglycemia (A1c 13.3 on 2/11) CBGs are to be stable Semglee 15 units+ SSI Follow/optimize.  Recent Labs    06/19/23 1622 06/19/23 2044 06/20/23 0746  GLUCAP 233* 167* 151*    HTN BP relatively stable Continue metoprolol/diuretics Follow/optimize  Normocytic anemia  Secondary to  critical illness No evidence of blood loss S/p 1 unit of PRBC on 2/17-Hb currently stable.  Thrombocytopenia Likely due to sepsis physiology Slowly improving Follow.  Sick euthyroid syndrome TSH/free T4 mildly elevated Repeat thyroid function test in 4 to 6 weeks when he has recovered from acute illness.  BMI: Estimated body mass index is 26.04 kg/m as calculated from the following:   Height as of this encounter: 6' (1.829 m).   Weight as of this encounter: 87.1 kg.   Code status:   Code Status: Full Code   DVT Prophylaxis: SCDs Start: 06/13/23 2205   Family Communication: None at bedside   Disposition Plan: Status is: Inpatient Remains inpatient appropriate because: Severity of illness   Planned Discharge Destination:Skilled nursing facility   Diet: Diet Order             Diet heart healthy/carb modified Room service appropriate? Yes; Fluid consistency: Thin  Diet effective now                     Antimicrobial agents: Anti-infectives (From admission, onward)    Start     Dose/Rate Route Frequency Ordered Stop   06/19/23 2200  vancomycin (VANCOREADY) IVPB 750 mg/150 mL        750 mg 150 mL/hr over 60 Minutes Intravenous Every 24 hours 06/19/23 1008     06/15/23 2200  vancomycin (VANCOREADY) IVPB 1500 mg/300 mL  Status:  Discontinued        1,500 mg 150 mL/hr over 120 Minutes Intravenous Every 24 hours 06/15/23 1417 06/19/23 1008   06/15/23 1845  ceFEPIme (MAXIPIME) 2 g in sodium chloride 0.9 % 100 mL IVPB        2 g 200 mL/hr over 30 Minutes Intravenous Every 8 hours 06/15/23 1445     06/14/23 1543  vancomycin variable dose per unstable renal function (pharmacist dosing)  Status:  Discontinued         Does not apply See admin instructions 06/14/23 1543 06/16/23 0709   06/14/23 1045  ceFEPIme (MAXIPIME) 2 g in sodium chloride 0.9 % 100 mL IVPB  Status:  Discontinued        2 g 200 mL/hr over 30 Minutes Intravenous Every 8 hours 06/14/23 0955 06/14/23  0956   06/14/23 1045  metroNIDAZOLE (FLAGYL) tablet 500 mg        500 mg Oral Every 12 hours 06/14/23 0955     06/14/23 1045  ceFEPIme (MAXIPIME) 2 g in sodium chloride 0.9 % 100 mL IVPB  Status:  Discontinued        2 g 200 mL/hr over 30 Minutes Intravenous Every 12 hours 06/14/23 0956 06/15/23 1445   06/14/23 1000  vancomycin (VANCOREADY) IVPB 1250 mg/250 mL  Status:  Discontinued        1,250 mg 166.7 mL/hr over 90 Minutes Intravenous Every 24 hours 06/14/23 0638 06/14/23 1545   06/14/23 0700  vancomycin (VANCOREADY) IVPB 1500 mg/300 mL        1,500 mg 150 mL/hr over 120 Minutes Intravenous  Once 06/14/23 0611 06/14/23 1938   06/14/23 0130  azithromycin (ZITHROMAX) 500 mg in sodium chloride 0.9 % 250 mL IVPB  Status:  Discontinued        500 mg 250 mL/hr over 60 Minutes Intravenous Daily at bedtime 06/14/23 0100 06/14/23 0611   06/14/23 0130  cefTRIAXone (ROCEPHIN) 1 g in sodium chloride 0.9 % 100 mL IVPB  Status:  Discontinued  1 g 200 mL/hr over 30 Minutes Intravenous Daily at bedtime 06/14/23 0100 06/14/23 1610        MEDICATIONS: Scheduled Meds:  atorvastatin  80 mg Oral Daily   Chlorhexidine Gluconate Cloth  6 each Topical Daily   digoxin  0.125 mg Oral Daily   furosemide  40 mg Intravenous Daily   insulin aspart  0-15 Units Subcutaneous TID WC   insulin aspart  0-5 Units Subcutaneous QHS   insulin glargine-yfgn  15 Units Subcutaneous Daily   insulin starter kit- pen needles  1 kit Other Once   living well with diabetes book   Does not apply Once   metoprolol tartrate  25 mg Oral TID   metroNIDAZOLE  500 mg Oral Q12H   pantoprazole  40 mg Oral Daily   sodium bicarbonate  650 mg Oral TID   sodium chloride flush  10-40 mL Intracatheter Q12H   tamsulosin  0.4 mg Oral Daily   Continuous Infusions:  ceFEPime (MAXIPIME) IV 2 g (06/20/23 0943)   heparin 1,700 Units/hr (06/20/23 0940)   sodium PHOSPHATE IVPB (in mmol) 30 mmol (06/20/23 0949)   vancomycin Stopped  (06/19/23 2230)   PRN Meds:.acetaminophen **OR** acetaminophen, ondansetron (ZOFRAN) IV, mouth rinse, sodium chloride flush   I have personally reviewed following labs and imaging studies  LABORATORY DATA: CBC: Recent Labs  Lab 06/13/23 1940 06/13/23 1958 06/16/23 0404 06/17/23 0200 06/18/23 0321 06/19/23 0351 06/19/23 2206 06/20/23 0355  WBC 21.8*   < > 12.6* 11.1* 12.0* 8.0  --  7.4  NEUTROABS 20.5*  --   --   --   --   --   --   --   HGB 8.9*   < > 8.4* 7.8* 7.8* 7.3* 9.4* 9.6*  HCT 27.9*   < > 26.1* 24.7* 24.1* 23.4* 29.2* 30.5*  MCV 87.5   < > 85.3 86.4 86.1 87.0  --  84.5  PLT 195   < > 118* 122* 114* 110*  --  124*   < > = values in this interval not displayed.    Basic Metabolic Panel: Recent Labs  Lab 06/15/23 0414 06/16/23 0404 06/16/23 2031 06/17/23 0654 06/18/23 0321 06/19/23 0351 06/20/23 0355  NA 135 131*  --  131* 133* 133* 135  K 3.4* 4.5  --  4.5 3.9 4.4 4.2  CL 105 104  --  107 108 110 110  CO2 20* 19*  --  18* 15* 17* 16*  GLUCOSE 162* 176*  --  149* 91 241* 154*  BUN 17 16  --  20 20 23 20   CREATININE 1.25* 1.26*  --  1.36* 1.22 1.28* 1.24  CALCIUM 7.7* 7.6*  --  7.7* 7.9* 7.6* 7.8*  MG 2.1 1.7  --  2.0  --  1.8 1.7  PHOS 2.8 1.6* 3.5 2.8  --  2.1* 2.1*    GFR: Estimated Creatinine Clearance: 66.9 mL/min (by C-G formula based on SCr of 1.24 mg/dL).  Liver Function Tests: Recent Labs  Lab 06/13/23 1939 06/14/23 0452 06/19/23 0351 06/20/23 0355  AST 29 26 12*  --   ALT 27 22 13   --   ALKPHOS 49 41 37*  --   BILITOT 0.7 0.6 0.6  --   PROT 6.7 6.0* 5.6*  --   ALBUMIN 2.1* 1.8* <1.5* 1.6*   Recent Labs  Lab 06/13/23 1939  LIPASE 18   Recent Labs  Lab 06/13/23 2031  AMMONIA 23    Coagulation Profile: Recent Labs  Lab 06/14/23 0452  INR 1.7*    Cardiac Enzymes: No results for input(s): "CKTOTAL", "CKMB", "CKMBINDEX", "TROPONINI" in the last 168 hours.  BNP (last 3 results) No results for input(s): "PROBNP" in the  last 8760 hours.  Lipid Profile: No results for input(s): "CHOL", "HDL", "LDLCALC", "TRIG", "CHOLHDL", "LDLDIRECT" in the last 72 hours.  Thyroid Function Tests: No results for input(s): "TSH", "T4TOTAL", "FREET4", "T3FREE", "THYROIDAB" in the last 72 hours.  Anemia Panel: No results for input(s): "VITAMINB12", "FOLATE", "FERRITIN", "TIBC", "IRON", "RETICCTPCT" in the last 72 hours.  Urine analysis:    Component Value Date/Time   COLORURINE YELLOW 05/03/2020 1303   APPEARANCEUR CLEAR 05/03/2020 1303   LABSPEC 1.022 05/03/2020 1303   PHURINE 6.0 05/03/2020 1303   GLUCOSEU >=500 (A) 05/03/2020 1303   HGBUR NEGATIVE 05/03/2020 1303   BILIRUBINUR NEGATIVE 05/03/2020 1303   KETONESUR 80 (A) 05/03/2020 1303   PROTEINUR NEGATIVE 05/03/2020 1303   NITRITE NEGATIVE 05/03/2020 1303   LEUKOCYTESUR NEGATIVE 05/03/2020 1303    Sepsis Labs: Lactic Acid, Venous    Component Value Date/Time   LATICACIDVEN 8.7 (HH) 06/14/2023 0452    MICROBIOLOGY: Recent Results (from the past 240 hours)  Resp panel by RT-PCR (RSV, Flu A&B, Covid) Anterior Nasal Swab     Status: None   Collection Time: 06/13/23  8:14 PM   Specimen: Anterior Nasal Swab  Result Value Ref Range Status   SARS Coronavirus 2 by RT PCR NEGATIVE NEGATIVE Final   Influenza A by PCR NEGATIVE NEGATIVE Final   Influenza B by PCR NEGATIVE NEGATIVE Final    Comment: (NOTE) The Xpert Xpress SARS-CoV-2/FLU/RSV plus assay is intended as an aid in the diagnosis of influenza from Nasopharyngeal swab specimens and should not be used as a sole basis for treatment. Nasal washings and aspirates are unacceptable for Xpert Xpress SARS-CoV-2/FLU/RSV testing.  Fact Sheet for Patients: BloggerCourse.com  Fact Sheet for Healthcare Providers: SeriousBroker.it  This test is not yet approved or cleared by the [place] FDA and has been authorized for detection and/or diagnosis of  SARS-CoV-2 by FDA under an Emergency Use Authorization (EUA). This EUA will remain in effect (meaning this test can be used) for the duration of the COVID-19 declaration under Section 564(b)(1) of the Act, 21 U.S.C. section 360bbb-3(b)(1), unless the authorization is terminated or revoked.     Resp Syncytial Virus by PCR NEGATIVE NEGATIVE Final    Comment: (NOTE) Fact Sheet for Patients: BloggerCourse.com  Fact Sheet for Healthcare Providers: SeriousBroker.it  This test is not yet approved or cleared by the [place] FDA and has been authorized for detection and/or diagnosis of SARS-CoV-2 by FDA under an Emergency Use Authorization (EUA). This EUA will remain in effect (meaning this test can be used) for the duration of the COVID-19 declaration under Section 564(b)(1) of the Act, 21 U.S.C. section 360bbb-3(b)(1), unless the authorization is terminated or revoked.  Performed at [facility], [address] [place], [name] [address]   Culture, blood (Routine X 2) w Reflex to ID Panel     Status: None   Collection Time: 06/14/23  4:52 AM   Specimen: BLOOD  Result Value Ref Range Status   Specimen Description BLOOD RIGHT ANTECUBITAL  Final   Special Requests   Final    BOTTLES DRAWN AEROBIC AND ANAEROBIC Blood Culture results may not be optimal due to an inadequate volume of blood received in culture bottles   Culture   Final    NO GROWTH 5  DAYS Performed at [facility], [address] [place], [name] [address]    Report Status 06/19/2023 FINAL  Final  Culture, blood (Routine X 2) w Reflex to ID Panel     Status: None   Collection Time: 06/14/23  5:00 AM   Specimen: BLOOD  Result Value Ref Range Status   Specimen Description BLOOD RIGHT ANTECUBITAL  Final   Special Requests   Final    BOTTLES DRAWN AEROBIC AND ANAEROBIC Blood Culture results may not be optimal due to an inadequate volume of blood received  in culture bottles   Culture   Final    NO GROWTH 5 DAYS Performed at [facility], [address], [place], [name] [address]    Report Status 06/19/2023 FINAL  Final  MRSA Next Gen by PCR, Nasal     Status: None   Collection Time: 06/14/23  5:15 AM   Specimen: Nasal Mucosa; Nasal Swab  Result Value Ref Range Status   MRSA by PCR Next Gen NOT DETECTED NOT DETECTED Final    Comment: (NOTE) The GeneXpert MRSA Assay (FDA approved for NASAL specimens only), is one component of a comprehensive MRSA colonization surveillance program. It is not intended to diagnose MRSA infection nor to guide or monitor treatment for MRSA infections. Test performance is not FDA approved in patients less than 73 years old. Performed at [facility], [address] [place], [name] [address]     RADIOLOGY STUDIES/RESULTS: No results found.   LOS: 7 days   Jeoffrey Massed, MD  [facility]    To contact the attending provider between 7A-7P or the covering provider during after hours 7P-7A, please log into the web site [email or web address] and access using universal [place] password for that web site. If you do not have the password, please call the hospital operator.  06/20/2023, 11:36 AM

## 2023-06-20 NOTE — Progress Notes (Addendum)
   06/20/23 1433  Mobility  Activity Ambulated with assistance to bathroom  Level of Assistance Minimal assist, patient does 75% or more (ModA+2 STS)  Assistive Device Front wheel walker  Distance Ambulated (ft) 20 ft  Activity Response Tolerated fair  Mobility Referral Yes  Mobility visit 1 Mobility  Mobility Specialist Start Time (ACUTE ONLY) 1405  Mobility Specialist Stop Time (ACUTE ONLY) 1433  Mobility Specialist Time Calculation (min) (ACUTE ONLY) 28 min   Mobility Specialist: Progress Note  Pre-Mobility:      HR 77 Post-Mobility:    HR 84, SpO2 95% RA  Pt agreeable to mobility session - received sitting EOB. C/o BM urgency, pericare completed IND. Pt required ModA+2 for STS from BR toilet. Returned to sitting EOB with all needs met - call bell within reach. Pt R toe with drainage and flimsy looking. Son present.   Barnie Mort, BS Mobility Specialist Please contact via SecureChat or  Rehab office at [phone].

## 2023-06-20 NOTE — Telephone Encounter (Signed)
Patient Product/process development scientist completed.    The patient is insured through [facility]. Patient has [employer], may use a copay card, and/or apply for patient assistance if available.    Ran test claim for Eliquis 5 mg and the current 30 day co-pay is $144.63 due to a deductible.   This test claim was processed through [facility]- copay amounts may vary at other pharmacies due to pharmacy/plan contracts, or as the patient moves through the different stages of their insurance plan.     Roland Earl, CPHT Pharmacy Technician III Certified Patient Advocate [facility] Patient Advocate Team Direct Number: [phone]  Fax: [phone]

## 2023-06-20 NOTE — TOC Progression Note (Signed)
Transition of Care [facility]) - Progression Note    Patient Details  Name: Derek Thompson MRN: [id number] Date of Birth: 1960/12/04  Transition of Care [facility]) CM/SW Contact  Mearl Latin, LCSW Phone Number: 06/20/2023, 12:55 PM  Clinical Narrative:    CSW met with patient, mother, brother, and son at bedside to discuss discharge options. CSW explained that LTACH has been suggested as an option and brother would like for [facility] to attempt insurance authorization as he is familiar with them. CSW provided SNF bed offers and Medicare ratings as an alternative option if LTACH is not approved. CSW received FMLA paperwork that family requests be completed. CSW will discuss with MD during rounds tomorrow.   [facility] LT[facility] will begin insurance process.    Expected Discharge Plan: Long Term Acute Care (LTAC) Barriers to Discharge: Continued Medical Work up, Conservator, museum/gallery and Services In-house Referral: Clinical Social Work   Post Acute Care Choice: Long Term Acute Care (LTAC), Skilled Nursing Facility Living arrangements for the past 2 months: Hotel/Motel                                       Social Determinants of Health (SDOH) Interventions SDOH Screenings   Food Insecurity: No Food Insecurity (06/14/2023)  Housing: Low Risk  (06/14/2023)  Transportation Needs: No Transportation Needs (06/14/2023)  Utilities: Not At Risk (06/14/2023)  Alcohol Screen: Low Risk  (08/19/2022)  Depression (PHQ2-9): Medium Risk (11/22/2022)  Financial Resource Strain: Low Risk  (08/19/2022)  Tobacco Use: Medium Risk (06/14/2023)    Readmission Risk Interventions     No data to display

## 2023-06-20 NOTE — Plan of Care (Signed)
  Problem: Clinical Measurements: Goal: Ability to maintain clinical measurements within normal limits will improve Outcome: Progressing   Problem: Activity: Goal: Risk for activity intolerance will decrease Outcome: Progressing   Problem: Safety: Goal: Ability to remain free from injury will improve Outcome: Progressing   Problem: Education: Goal: Knowledge of disease or condition will improve Outcome: Progressing   Problem: Ischemic Stroke/TIA Tissue Perfusion: Goal: Complications of ischemic stroke/TIA will be minimized Outcome: Progressing

## 2023-06-20 NOTE — Progress Notes (Signed)
Initial Nutrition Assessment  DOCUMENTATION CODES:   Not applicable  INTERVENTION:  Ensure Plus High Protein po BID, each supplement provides 350 kcal and 20 grams of protein. Multivitamin/minerals   NUTRITION DIAGNOSIS:   Increased nutrient needs related to acute illness as evidenced by estimated needs.    GOAL:   Patient will meet greater than or equal to 90% of their needs    MONITOR:   PO intake  REASON FOR ASSESSMENT:   Consult Assessment of nutrition requirement/status  ASSESSMENT:   63 y.o. M, presented to ED from home with complaints of, generalized weakness. Patient reports 1 week of progressive weakness, stuffy/runny nose,and cough. Admitted with primary problem of hyperosmolar hyperglycemic. PMH: HTN, poorly controlled T2DM, Prostate cancer, MI, CAD, G2DD, LBBB, and LV global hypokinesis. Discussed pt in rounds this morning.  MD reported  found to have HHS secondary to right diabetic foot (great/second toe) with gangrene/osteomyelitis, LV thrombus and acute CVA.  He was stabilized in the ICU-and subsequently transferred to [facility] on 2/16.  Possible amputation of toe.  No dietary concerns noted.  Patient reports no weight loss or appetite.. Able to feed self.  Patient stated no dietary concerns.  Significant events: 2/12>> admit to [facility]-HHS/encephalopathy/diabetic foot-developed hypotension-started on Levophed-transfer to ICU. 2/14>> RLE angiogram-small vessel disease. 2/16>> transferred to [facility].   Significant studies: 2/11>> A1c: 13.3 2/12>> MRI brain: Acute infarct right frontal lobe, additional small infarct right cerebellum, left frontal/parietal/occipital lobe. 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 2/12>> renal ultrasound: No hydronephrosis 2/12>> echo: EF 20-25%, LV thrombus.  Grade 2 diastolic dysfunction.  Moderate mitral valve regurgitation 2/13>> LDL: 49.   Significant  microbiology data: 2/11>> COVID/influenza/RSV PCR: Negative 2/12>> blood culture: Negative.   Procedures: 2/14>> RLE angiogram by vascular surgery-small vessel disease.   Weight history: 06/20/23 87.1 kg  11/22/22 81.1 kg  10/19/22 78.7 kg  10/14/22 81.6 kg  10/05/22 80.4 kg  09/20/22 80.8 kg  09/07/22 85.4 kg  08/26/22 84.6 kg  08/20/22 79.4 kg  03/23/21 87.1 kg   Hospital weight history:  06/20/23 0313 87.1 kg 192.02 lbs  06/19/23 0407 83.7 kg 184.53 lbs  06/16/23 0500 77.5 kg 170.86 lbs  06/15/23 0500 77.5 kg 170.86 lbs  06/14/23 0520 77.5 kg 170.86 lbs  06/13/23 1851 80.7 kg 178 lbs      Average Meal Intake: 50-75% average intake.   Nutritionally Relevant Medications: Scheduled Meds:  metoprolol tartrate  25 mg Oral BID   sodium bicarbonate  650 mg Oral TID    Labs Reviewed:  CBG ranges from 154-241 mg/dL over the last 24 hours HgbA1c 13.3    NUTRITION - FOCUSED PHYSICAL EXAM:  Flowsheet Row Most Recent Value  Orbital Region No depletion  Upper Arm Region No depletion  Thoracic and Lumbar Region No depletion  Buccal Region No depletion  Temple Region No depletion  Clavicle Bone Region No depletion  Clavicle and Acromion Bone Region No depletion  Scapular Bone Region No depletion  Patellar Region No depletion  Anterior Thigh Region No depletion  Posterior Calf Region No depletion  Edema (RD Assessment) Mild  Hair Reviewed  Eyes Reviewed  Mouth Reviewed  Skin Reviewed  Nails Reviewed       Diet Order:   Diet Order             Diet heart healthy/carb modified Room service appropriate? Yes; Fluid consistency: Thin  Diet effective now  EDUCATION NEEDS:   Education needs have been addressed  Skin:  Skin Assessment: Skin Integrity Issues: Skin Integrity Issues:: Other (Comment) Other: Non pressure wound Toe  Last BM:  06/20/23 (type 6)  Height:   Ht Readings from Last 1 Encounters:  06/13/23 6' (1.829 m)     Weight:   Wt Readings from Last 1 Encounters:  06/20/23 87.1 kg    Ideal Body Weight:     BMI:  Body mass index is 26.04 kg/m.  Estimated Nutritional Needs:   Kcal:  9147-8295 kcal  Protein:  100-110 g  Fluid:  69ml/kcal    Jamelle Haring RDN, LDN Clinical Dietitian   If unable to reach, please contact "RD Inpatient" secure chat group between 8 am-4 pm daily"

## 2023-06-20 NOTE — Progress Notes (Signed)
PHARMACY - ANTICOAGULATION CONSULT NOTE  Pharmacy Consult for Heparin Indication:  LV thrombus + thromboembolic vs ischemic stroke  No Known Allergies  Patient Measurements: Height: 6' (182.9 cm) Weight: 87.1 kg (192 lb 0.3 oz) IBW/kg (Calculated) : 77.6 Heparin Dosing Weight: 87 kg  Vital Signs: Temp: 98 F (36.7 C) (02/18 0745) Temp Source: Oral (02/18 0745) BP: 121/72 (02/18 0745) Pulse Rate: 80 (02/18 0745)  Labs: Recent Labs    06/18/23 0321 06/18/23 1849 06/19/23 0351 06/19/23 2206 06/20/23 0355  HGB 7.8*  --  7.3* 9.4* 9.6*  HCT 24.1*  --  23.4* 29.2* 30.5*  PLT 114*  --  110*  --  124*  HEPARINUNFRC 0.56 0.55 0.50  --  0.62  CREATININE 1.22  --  1.28*  --  1.24    Estimated Creatinine Clearance: 66.9 mL/min (by C-G formula based on SCr of 1.24 mg/dL).  Assessment: 63 y.o M w/ PMHx of poorly controlled T2DM, HFrEF (LVEF 25%), LBBB, G2DD and LV global hypokinesis, HTN, CAD, Prostate Cancer treated w/ radiation 2022 who presented to ED for 1 week of progressive generalized weakness, 2 days AMS, admitted for suspected HHS. 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 Note patient also with acute/subacute infarct in R frontal lobe, with suspicion for embolic etiology.   Heparin level is upper-range therapeutic (0.62) on 1800 units/hr. Targeting low-therapeutic heparin levels.  Hgb improved after 2 units PRBCs on 2/17; platelet count low stable. No bleeding reported.  Goal of Therapy:  Heparin level 0.3-0.5 units/ml (targeting low end for concomitant CVA w/ petechial hemorrhage) Monitor platelets by anticoagulation protocol: Yes   Plan:  Decrease heparin drip to 1700 units/hr Heparin level ~8 hours after rate change. Daily heparin level and CBC while on heparin. For OR on 2/19; will follow up for when to hold heparin pre-op. Follow up for transition to oral anticoagulation (expect Eliquis) post-op when  able.  Dennie Fetters, RPh 06/20/2023,9:28 AM

## 2023-06-20 NOTE — NC FL2 (Signed)
[place] MEDICAID FL2 LEVEL OF CARE FORM     IDENTIFICATION  Patient Name: Derek Thompson Birthdate: 1961-02-14 Sex: male Admission Date (Current Location): 06/13/2023  [facility] and [place] Number:  Producer, television/film/video and Address:  [facility] [place], [address], [place], [name] [address]      Provider Number: [id number]  Attending Physician Name and Address:  Maretta Bees, MD  Relative Name and Phone Number:       Current Level of Care: Hospital Recommended Level of Care: Skilled Nursing Facility Prior Approval Number:    Date Approved/Denied:   PASRR Number: [id number] E Expired 07/19/2023  Discharge Plan: SNF    Current Diagnoses: Patient Active Problem List   Diagnosis Date Noted   LV (left ventricular) mural thrombus without MI (HCC) 06/19/2023   NSVT (nonsustained ventricular tachycardia) (HCC) 06/19/2023   Acute CVA (cerebrovascular accident) (HCC) 06/19/2023   Mixed hyperlipidemia 06/19/2023   Noncompliance 06/19/2023   Benign hypertension 06/19/2023   Preop cardiovascular exam 06/19/2023   Chronic osteomyelitis of toe, right (HCC) 06/15/2023   Cutaneous abscess of right foot 06/15/2023   Generalized weakness 06/14/2023   Acute metabolic encephalopathy 06/14/2023   SIRS (systemic inflammatory response syndrome) (HCC) 06/14/2023   High anion gap metabolic acidosis 06/14/2023   Lactic acidosis 06/14/2023   AKI (acute kidney injury) (HCC) 06/14/2023   Acute on chronic anemia 06/14/2023   Hypomagnesemia 06/14/2023   Hyperglycemia 06/14/2023   Septic shock (HCC) 06/14/2023   Depressed left ventricular ejection fraction 06/14/2023   Hyperosmolar hyperglycemic state (HHS) (HCC) 06/13/2023   Hyperkalemia 11/22/2022   Iron deficiency anemia 08/27/2022   MDD (major depressive disorder) 08/27/2022   Healthcare maintenance 08/27/2022   Genetic testing 10/12/2020   Malignant neoplasm of prostate (HCC)    Family history of breast  cancer    CAD (coronary artery disease) 06/10/2020   Hypertension 06/10/2020   Dyslipidemia 06/10/2020   DM2 (diabetes mellitus, type 2) (HCC) 06/10/2020   Chronic HFrEF (heart failure with reduced ejection fraction) (HCC)    Pulmonary edema cardiac cause (HCC) 05/22/2020    Orientation RESPIRATION BLADDER Height & Weight     Self, Situation, Time, Place  Normal Incontinent, External catheter Weight: 192 lb 0.3 oz (87.1 kg) Height:  6' (182.9 cm)  BEHAVIORAL SYMPTOMS/MOOD NEUROLOGICAL BOWEL NUTRITION STATUS      Incontinent Diet (See dc summary)  AMBULATORY STATUS COMMUNICATION OF NEEDS Skin   Limited Assist Verbally Other (Comment) (Wound / Incision - Non-pressure wound Toe; Anterior;Left)                       Personal Care Assistance Level of Assistance  Bathing, Feeding, Dressing Bathing Assistance: Limited assistance Feeding assistance: Independent Dressing Assistance: Limited assistance     Functional Limitations Info  Sight, Hearing, Speech Sight Info: Impaired Financial trader) Hearing Info: Adequate Speech Info: Adequate    SPECIAL CARE FACTORS FREQUENCY  PT (By licensed PT), OT (By licensed OT), Speech therapy     PT Frequency: 5x week OT Frequency: 5x week     Speech Therapy Frequency: 5x week      Contractures Contractures Info: Not present    Additional Factors Info  Code Status, Insulin Sliding Scale Code Status Info: Full     Insulin Sliding Scale Info: See dc summary       Current Medications (06/20/2023):  This is the current hospital active medication list Current Facility-Administered Medications  Medication Dose Route Frequency Provider Last  Rate Last Admin   acetaminophen (TYLENOL) tablet 650 mg  650 mg Oral Q6H PRN Howerter, Justin B, DO   650 mg at 06/17/23 2310   Or   acetaminophen (TYLENOL) suppository 650 mg  650 mg Rectal Q6H PRN Howerter, Justin B, DO       atorvastatin (LIPITOR) tablet 80 mg  80 mg Oral Daily Patrici Ranks,  MD   80 mg at 06/19/23 1610   ceFEPIme (MAXIPIME) 2 g in sodium chloride 0.9 % 100 mL IVPB  2 g Intravenous Q8H Martina Sinner, MD   Stopped at 06/20/23 0400   Chlorhexidine Gluconate Cloth 2 % PADS 6 each  6 each Topical Daily Martina Sinner, MD   6 each at 06/19/23 2040   digoxin (LANOXIN) tablet 0.125 mg  0.125 mg Oral Daily Tawkaliyar, Roya, DO   0.125 mg at 06/19/23 0906   heparin ADULT infusion 100 units/mL (25000 units/218mL)  1,800 Units/hr Intravenous Continuous Francena Hanly, RPH 18 mL/hr at 06/20/23 0107 1,800 Units/hr at 06/20/23 0107   insulin aspart (novoLOG) injection 0-15 Units  0-15 Units Subcutaneous TID WC Candelaria Stagers T, MD   5 Units at 06/19/23 1656   insulin aspart (novoLOG) injection 0-5 Units  0-5 Units Subcutaneous QHS Candelaria Stagers T, MD   2 Units at 06/18/23 2150   insulin glargine-yfgn (SEMGLEE) injection 15 Units  15 Units Subcutaneous Daily Almon Hercules, MD       metoprolol tartrate (LOPRESSOR) tablet 25 mg  25 mg Oral BID Paliwal, Aditya, MD   25 mg at 06/19/23 2040   metroNIDAZOLE (FLAGYL) tablet 500 mg  500 mg Oral Q12H Tawkaliyar, Roya, DO   500 mg at 06/19/23 2038   ondansetron (ZOFRAN) injection 4 mg  4 mg Intravenous Q6H PRN Howerter, Justin B, DO       Oral care mouth rinse  15 mL Mouth Rinse PRN Lorin Glass, MD       pantoprazole (PROTONIX) EC tablet 40 mg  40 mg Oral Daily Tawkaliyar, Roya, DO   40 mg at 06/19/23 9604   sodium bicarbonate tablet 650 mg  650 mg Oral TID Candelaria Stagers T, MD   650 mg at 06/19/23 2038   sodium chloride flush (NS) 0.9 % injection 10-40 mL  10-40 mL Intracatheter Q12H Martina Sinner, MD   10 mL at 06/19/23 2045   sodium chloride flush (NS) 0.9 % injection 10-40 mL  10-40 mL Intracatheter PRN Martina Sinner, MD       sodium phosphate 30 mmol in sodium chloride 0.9 % 250 mL infusion  30 mmol Intravenous Once Maretta Bees, MD       vancomycin Luna Kitchens) IVPB 750 mg/150 mL  750 mg Intravenous Q24H Francena Hanly, [employer]   Stopped at 06/19/23 2230     Discharge Medications: Please see discharge summary for a list of discharge medications.  Relevant Imaging Results:  Relevant Lab Results:   Additional Information SSN [id number] [place], [name]

## 2023-06-20 NOTE — Progress Notes (Signed)
PHARMACY - ANTICOAGULATION CONSULT NOTE  Pharmacy Consult for Heparin Indication:  LV thrombus + thromboembolic vs ischemic stroke  No Known Allergies  Patient Measurements: Height: 6' (182.9 cm) Weight: 87.1 kg (192 lb 0.3 oz) IBW/kg (Calculated) : 77.6 Heparin Dosing Weight: 87 kg  Vital Signs: Temp: 97.4 F (36.3 C) (02/18 1634) Temp Source: Oral (02/18 1634) BP: 97/52 (02/18 1634) Pulse Rate: 75 (02/18 1634)  Labs: Recent Labs    06/18/23 0321 06/18/23 1849 06/19/23 0351 06/19/23 2206 06/20/23 0355 06/20/23 1810  HGB 7.8*  --  7.3* 9.4* 9.6*  --   HCT 24.1*  --  23.4* 29.2* 30.5*  --   PLT 114*  --  110*  --  124*  --   HEPARINUNFRC 0.56   < > 0.50  --  0.62 0.48  CREATININE 1.22  --  1.28*  --  1.24  --    < > = values in this interval not displayed.    Estimated Creatinine Clearance: 66.9 mL/min (by C-G formula based on SCr of 1.24 mg/dL).  Assessment: 63 y.o M w/ PMHx of poorly controlled T2DM, HFrEF (LVEF 25%), LBBB, G2DD and LV global hypokinesis, HTN, CAD, Prostate Cancer treated w/ radiation 2022 who presented to ED for 1 week of progressive generalized weakness, 2 days AMS, admitted for suspected HHS. 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 Note patient also with acute/subacute infarct in R frontal lobe, with suspicion for embolic etiology.   Heparin level therapeutic (0.48) on infusion at 1700 units/hr No bleeding noted.  Goal of Therapy:  Heparin level 0.3-0.5 units/ml (targeting low end for concomitant CVA w/ petechial hemorrhage) Monitor platelets by anticoagulation protocol: Yes   Plan:  Continue heparin infusion at 1700 units/hr Will f/u a.m. heparin level  Christoper Fabian, PharmD, BCPS Please see amion for complete clinical pharmacist phone list 06/20/2023,7:11 PM

## 2023-06-20 NOTE — Plan of Care (Signed)
  Problem: Clinical Measurements: Goal: Ability to maintain clinical measurements within normal limits will improve Outcome: Progressing   Problem: Activity: Goal: Risk for activity intolerance will decrease Outcome: Progressing   Problem: Safety: Goal: Ability to remain free from injury will improve Outcome: Progressing   Problem: Education: Goal: Knowledge of disease or condition will improve Outcome: Progressing Goal: Knowledge of secondary prevention will improve (MUST DOCUMENT ALL) Outcome: Progressing   Problem: Ischemic Stroke/TIA Tissue Perfusion: Goal: Complications of ischemic stroke/TIA will be minimized Outcome: Progressing

## 2023-06-21 ENCOUNTER — Other Ambulatory Visit: Payer: Self-pay

## 2023-06-21 ENCOUNTER — Encounter (HOSPITAL_COMMUNITY): Payer: Self-pay | Admitting: Internal Medicine

## 2023-06-21 ENCOUNTER — Inpatient Hospital Stay (HOSPITAL_COMMUNITY): Payer: BC Managed Care – PPO | Admitting: Anesthesiology

## 2023-06-21 ENCOUNTER — Encounter (HOSPITAL_COMMUNITY)
Admission: EM | Disposition: A | Discharge status: Skilled Nursing Facility | Payer: Self-pay | Source: Home / Self Care | Attending: Internal Medicine

## 2023-06-21 DIAGNOSIS — E860 Dehydration: Secondary | ICD-10-CM

## 2023-06-21 DIAGNOSIS — I240 Acute coronary thrombosis not resulting in myocardial infarction: Secondary | ICD-10-CM | POA: Diagnosis not present

## 2023-06-21 DIAGNOSIS — I4729 Other ventricular tachycardia: Secondary | ICD-10-CM | POA: Diagnosis not present

## 2023-06-21 DIAGNOSIS — I5022 Chronic systolic (congestive) heart failure: Secondary | ICD-10-CM | POA: Diagnosis not present

## 2023-06-21 DIAGNOSIS — L02611 Cutaneous abscess of right foot: Secondary | ICD-10-CM | POA: Diagnosis not present

## 2023-06-21 DIAGNOSIS — M86671 Other chronic osteomyelitis, right ankle and foot: Secondary | ICD-10-CM | POA: Diagnosis not present

## 2023-06-21 DIAGNOSIS — A419 Sepsis, unspecified organism: Secondary | ICD-10-CM | POA: Diagnosis not present

## 2023-06-21 HISTORY — PX: AMPUTATION: SHX166

## 2023-06-21 LAB — CBC
HCT: 31.3 % — ABNORMAL LOW (ref 39.0–52.0)
Hemoglobin: 10 g/dL — ABNORMAL LOW (ref 13.0–17.0)
MCH: 26.6 pg (ref 26.0–34.0)
MCHC: 31.9 g/dL (ref 30.0–36.0)
MCV: 83.2 fL (ref 80.0–100.0)
Platelets: 129 10*3/uL — ABNORMAL LOW (ref 150–400)
RBC: 3.76 MIL/uL — ABNORMAL LOW (ref 4.22–5.81)
RDW: 20 % — ABNORMAL HIGH (ref 11.5–15.5)
WBC: 6.7 10*3/uL (ref 4.0–10.5)
nRBC: 0 % (ref 0.0–0.2)

## 2023-06-21 LAB — GLUCOSE, CAPILLARY
Glucose-Capillary: 111 mg/dL — ABNORMAL HIGH (ref 70–99)
Glucose-Capillary: 114 mg/dL — ABNORMAL HIGH (ref 70–99)
Glucose-Capillary: 181 mg/dL — ABNORMAL HIGH (ref 70–99)
Glucose-Capillary: 189 mg/dL — ABNORMAL HIGH (ref 70–99)
Glucose-Capillary: 203 mg/dL — ABNORMAL HIGH (ref 70–99)
Glucose-Capillary: 249 mg/dL — ABNORMAL HIGH (ref 70–99)
Glucose-Capillary: 296 mg/dL — ABNORMAL HIGH (ref 70–99)

## 2023-06-21 LAB — BASIC METABOLIC PANEL
Anion gap: 6 (ref 5–15)
BUN: 20 mg/dL (ref 8–23)
CO2: 18 mmol/L — ABNORMAL LOW (ref 22–32)
Calcium: 7.9 mg/dL — ABNORMAL LOW (ref 8.9–10.3)
Chloride: 109 mmol/L (ref 98–111)
Creatinine, Ser: 1.44 mg/dL — ABNORMAL HIGH (ref 0.61–1.24)
GFR, Estimated: 55 mL/min — ABNORMAL LOW (ref >60)
Glucose, Bld: 160 mg/dL — ABNORMAL HIGH (ref 70–99)
Potassium: 4 mmol/L (ref 3.5–5.1)
Sodium: 133 mmol/L — ABNORMAL LOW (ref 135–145)

## 2023-06-21 LAB — HEPARIN LEVEL (UNFRACTIONATED): Heparin Unfractionated: 0.45 [IU]/mL (ref 0.30–0.70)

## 2023-06-21 LAB — MAGNESIUM: Magnesium: 1.9 mg/dL (ref 1.7–2.4)

## 2023-06-21 LAB — PHOSPHORUS: Phosphorus: 2.7 mg/dL (ref 2.5–4.6)

## 2023-06-21 SURGERY — AMPUTATION, FOOT, RAY
Anesthesia: General | Site: Foot | Laterality: Right

## 2023-06-21 MED ORDER — 0.9 % SODIUM CHLORIDE (POUR BTL) OPTIME
TOPICAL | Status: DC | PRN
  Administered 2023-06-21: 1000 mL

## 2023-06-21 MED ORDER — JUVEN PO PACK
1.0000 | PACK | Freq: Two times a day (BID) | ORAL | Status: DC
  Administered 2023-06-21 – 2023-06-29 (×15): 1 via ORAL
  Filled 2023-06-21 (×16): qty 1

## 2023-06-21 MED ORDER — ONDANSETRON HCL 4 MG/2ML IJ SOLN
INTRAMUSCULAR | Status: DC | PRN
  Administered 2023-06-21: 4 mg via INTRAVENOUS

## 2023-06-21 MED ORDER — PHENYLEPHRINE 80 MCG/ML (10ML) SYRINGE FOR IV PUSH (FOR BLOOD PRESSURE SUPPORT)
PREFILLED_SYRINGE | INTRAVENOUS | Status: DC | PRN
Start: 2023-06-21 — End: 2023-06-21
  Administered 2023-06-21: 80 ug via INTRAVENOUS
  Administered 2023-06-21 (×2): 160 ug via INTRAVENOUS
  Administered 2023-06-21 (×3): 80 ug via INTRAVENOUS
  Administered 2023-06-21: 160 ug via INTRAVENOUS
  Administered 2023-06-21 (×2): 80 ug via INTRAVENOUS

## 2023-06-21 MED ORDER — LIDOCAINE 2% (20 MG/ML) 5 ML SYRINGE
INTRAMUSCULAR | Status: AC
  Filled 2023-06-21: qty 5

## 2023-06-21 MED ORDER — ONDANSETRON HCL 4 MG/2ML IJ SOLN: 4.0000 mg | Freq: Once | INTRAMUSCULAR | Status: DC | PRN

## 2023-06-21 MED ORDER — ENSURE PRE-SURGERY PO LIQD
296.0000 mL | Freq: Once | ORAL | Status: AC
  Administered 2023-06-21: 296 mL via ORAL
  Filled 2023-06-21: qty 296

## 2023-06-21 MED ORDER — CEFAZOLIN SODIUM-DEXTROSE 1-4 GM/50ML-% IV SOLN: 1.0000 g | Freq: Three times a day (TID) | INTRAVENOUS | Status: DC

## 2023-06-21 MED ORDER — VANCOMYCIN HCL 1000 MG IV SOLR
INTRAVENOUS | Status: AC
  Filled 2023-06-21: qty 20

## 2023-06-21 MED ORDER — CHLORHEXIDINE GLUCONATE 0.12 % MT SOLN: 15.0000 mL | Freq: Once | OROMUCOSAL | Status: AC

## 2023-06-21 MED ORDER — VANCOMYCIN HCL 1000 MG IV SOLR
INTRAVENOUS | Status: DC | PRN
  Administered 2023-06-21: 1000 mg

## 2023-06-21 MED ORDER — ORAL CARE MOUTH RINSE: 15.0000 mL | Freq: Once | OROMUCOSAL | Status: AC

## 2023-06-21 MED ORDER — VITAMIN C 500 MG PO TABS
1000.0000 mg | ORAL_TABLET | Freq: Every day | ORAL | Status: DC
  Administered 2023-06-21 – 2023-06-29 (×9): 1000 mg via ORAL
  Filled 2023-06-21 (×9): qty 2

## 2023-06-21 MED ORDER — LIDOCAINE 2% (20 MG/ML) 5 ML SYRINGE
INTRAMUSCULAR | Status: DC | PRN
  Administered 2023-06-21: 40 mg via INTRAVENOUS

## 2023-06-21 MED ORDER — CHLORHEXIDINE GLUCONATE 0.12 % MT SOLN
OROMUCOSAL | Status: AC
Start: 2023-06-21 — End: 2023-06-21
  Administered 2023-06-21: 15 mL via OROMUCOSAL
  Filled 2023-06-21: qty 15

## 2023-06-21 MED ORDER — LACTATED RINGERS IV SOLN: INTRAVENOUS | Status: DC

## 2023-06-21 MED ORDER — MIDAZOLAM HCL 2 MG/2ML IJ SOLN
INTRAMUSCULAR | Status: DC | PRN
  Administered 2023-06-21: 1 mg via INTRAVENOUS

## 2023-06-21 MED ORDER — POVIDONE-IODINE 10 % EX SWAB
2.0000 | Freq: Once | CUTANEOUS | Status: AC
  Administered 2023-06-21: 2 via TOPICAL

## 2023-06-21 MED ORDER — PHENYLEPHRINE 80 MCG/ML (10ML) SYRINGE FOR IV PUSH (FOR BLOOD PRESSURE SUPPORT)
PREFILLED_SYRINGE | INTRAVENOUS | Status: AC
Start: 2023-06-21 — End: ?
  Filled 2023-06-21: qty 10

## 2023-06-21 MED ORDER — ONDANSETRON HCL 4 MG/2ML IJ SOLN
INTRAMUSCULAR | Status: AC
  Filled 2023-06-21: qty 2

## 2023-06-21 MED ORDER — FENTANYL CITRATE (PF) 250 MCG/5ML IJ SOLN
INTRAMUSCULAR | Status: AC
  Filled 2023-06-21: qty 5

## 2023-06-21 MED ORDER — EPHEDRINE 5 MG/ML INJ
INTRAVENOUS | Status: AC
  Filled 2023-06-21: qty 5

## 2023-06-21 MED ORDER — EPHEDRINE SULFATE-NACL 50-0.9 MG/10ML-% IV SOSY
PREFILLED_SYRINGE | INTRAVENOUS | Status: DC | PRN
  Administered 2023-06-21: 5 mg via INTRAVENOUS
  Administered 2023-06-21 (×2): 10 mg via INTRAVENOUS

## 2023-06-21 MED ORDER — PROPOFOL 10 MG/ML IV BOLUS
INTRAVENOUS | Status: AC
  Filled 2023-06-21: qty 20

## 2023-06-21 MED ORDER — FENTANYL CITRATE (PF) 250 MCG/5ML IJ SOLN
INTRAMUSCULAR | Status: DC | PRN
  Administered 2023-06-21 (×2): 25 ug via INTRAVENOUS

## 2023-06-21 MED ORDER — MIDAZOLAM HCL 2 MG/2ML IJ SOLN
INTRAMUSCULAR | Status: AC
  Filled 2023-06-21: qty 2

## 2023-06-21 MED ORDER — VASHE WOUND IRRIGATION OPTIME
TOPICAL | Status: DC | PRN
  Administered 2023-06-21: 34 [oz_av]

## 2023-06-21 MED ORDER — OXYCODONE HCL 5 MG/5ML PO SOLN: 5.0000 mg | Freq: Once | ORAL | Status: DC | PRN

## 2023-06-21 MED ORDER — PROPOFOL 10 MG/ML IV BOLUS
INTRAVENOUS | Status: DC | PRN
  Administered 2023-06-21: 100 mg via INTRAVENOUS

## 2023-06-21 MED ORDER — SODIUM CHLORIDE 0.9 % IV SOLN: INTRAVENOUS | Status: DC

## 2023-06-21 MED ORDER — CHLORHEXIDINE GLUCONATE 4 % EX SOLN
60.0000 mL | Freq: Once | CUTANEOUS | Status: AC
  Administered 2023-06-21: 4 via TOPICAL
  Filled 2023-06-21: qty 60

## 2023-06-21 MED ORDER — CEFAZOLIN SODIUM-DEXTROSE 2-4 GM/100ML-% IV SOLN
2.0000 g | INTRAVENOUS | Status: AC
Start: 2023-06-21 — End: 2023-06-21
  Administered 2023-06-21: 2 g via INTRAVENOUS
  Filled 2023-06-21: qty 100

## 2023-06-21 MED ORDER — OXYCODONE HCL 5 MG PO TABS: 5.0000 mg | ORAL_TABLET | Freq: Once | ORAL | Status: DC | PRN

## 2023-06-21 MED ORDER — HYDROMORPHONE HCL 1 MG/ML IJ SOLN: 0.2500 mg | INTRAMUSCULAR | Status: DC | PRN

## 2023-06-21 MED ORDER — ZINC SULFATE 220 (50 ZN) MG PO CAPS
220.0000 mg | ORAL_CAPSULE | Freq: Every day | ORAL | Status: DC
  Administered 2023-06-21 – 2023-06-29 (×9): 220 mg via ORAL
  Filled 2023-06-21 (×9): qty 1

## 2023-06-21 MED ORDER — HEPARIN (PORCINE) 25000 UT/250ML-% IV SOLN
1750.0000 [IU]/h | INTRAVENOUS | Status: AC
  Administered 2023-06-21 – 2023-06-22 (×2): 1700 [IU]/h via INTRAVENOUS
  Filled 2023-06-21: qty 250

## 2023-06-21 SURGICAL SUPPLY — 27 items
BAG COUNTER SPONGE SURGICOUNT (BAG) ×1 IMPLANT
BLADE SAW SGTL MED 73X18.5 STR (BLADE) IMPLANT
BLADE SURG 21 STRL SS (BLADE) ×1 IMPLANT
BNDG COHESIVE 6X5 TAN NS LF (GAUZE/BANDAGES/DRESSINGS) IMPLANT
BNDG GAUZE DERMACEA FLUFF 4 (GAUZE/BANDAGES/DRESSINGS) ×1 IMPLANT
COVER SURGICAL LIGHT HANDLE (MISCELLANEOUS) ×2 IMPLANT
DRAPE U-SHAPE 47X51 STRL (DRAPES) ×2 IMPLANT
DRSG ADAPTIC 3X8 NADH LF (GAUZE/BANDAGES/DRESSINGS) ×1 IMPLANT
DURAPREP 26ML APPLICATOR (WOUND CARE) ×1 IMPLANT
ELECT REM PT RETURN 9FT ADLT (ELECTROSURGICAL) ×1 IMPLANT
ELECTRODE REM PT RTRN 9FT ADLT (ELECTROSURGICAL) ×1 IMPLANT
GAUZE PAD ABD 8X10 STRL (GAUZE/BANDAGES/DRESSINGS) ×2 IMPLANT
GAUZE SPONGE 4X4 12PLY STRL (GAUZE/BANDAGES/DRESSINGS) ×1 IMPLANT
GLOVE BIOGEL PI IND STRL 9 (GLOVE) ×1 IMPLANT
GLOVE SURG ORTHO 9.0 STRL STRW (GLOVE) ×1 IMPLANT
GOWN STRL REUS W/ TWL XL LVL3 (GOWN DISPOSABLE) ×2 IMPLANT
GRAFT SKIN WND MICRO 38 (Tissue) IMPLANT
KIT BASIN OR (CUSTOM PROCEDURE TRAY) ×1 IMPLANT
KIT TURNOVER KIT B (KITS) ×1 IMPLANT
NS IRRIG 1000ML POUR BTL (IV SOLUTION) ×1 IMPLANT
PACK ORTHO EXTREMITY (CUSTOM PROCEDURE TRAY) ×1 IMPLANT
PAD ARMBOARD 7.5X6 YLW CONV (MISCELLANEOUS) ×2 IMPLANT
STOCKINETTE IMPERVIOUS LG (DRAPES) IMPLANT
SUT ETHILON 2 0 PSLX (SUTURE) ×1 IMPLANT
TOWEL GREEN STERILE (TOWEL DISPOSABLE) ×1 IMPLANT
TUBE CONNECTING 12X1/4 (SUCTIONS) ×1 IMPLANT
YANKAUER SUCT BULB TIP NO VENT (SUCTIONS) ×1 IMPLANT

## 2023-06-21 NOTE — Anesthesia Procedure Notes (Signed)
Procedure Name: LMA Insertion Date/Time: 06/21/2023 10:20 AM  Performed by: Ayesha Rumpf, CRNAPre-anesthesia Checklist: Patient identified, Emergency Drugs available, Suction available and Patient being monitored Patient Re-evaluated:Patient Re-evaluated prior to induction Oxygen Delivery Method: Circle System Utilized Preoxygenation: Pre-oxygenation with 100% oxygen Induction Type: IV induction Ventilation: Mask ventilation without difficulty LMA: LMA inserted LMA Size: 5.0 Number of attempts: 1 Airway Equipment and Method: Bite block Placement Confirmation: positive ETCO2 Tube secured with: Tape Dental Injury: Teeth and Oropharynx as per pre-operative assessment

## 2023-06-21 NOTE — Progress Notes (Signed)
   06/21/23 1228  Spiritual Encounters  Type of Visit Initial  Care provided to: Family  Referral source Family  Reason for visit Routine spiritual support  OnCall Visit No   Patient's brother visited spiritual care office inquiring about assistance for patient who is a veteran and possibly facing homelessness due to stroke. Additionally family inquiring about physician completing disability form for patient's employer. Brother will discuss needs with CWS and is requesting a family meeting.  Brother also asked that chaplains pray for and visit patient to supply spiritual support. Will follow-up with patient and family.

## 2023-06-21 NOTE — Progress Notes (Signed)
PROGRESS NOTE        PATIENT DETAILS Name: Derek Thompson Age: 63 y.o. Sex: male Date of Birth: 27-Nov-1960 Admit Date: 06/13/2023 Admitting Physician Angie Fava, DO WGN:FAOZH, Casimiro Needle, DO  Brief Summary: Patient is a 63 y.o.  male with history of chronic HFrEF, DM-2, HTN, prostate cancer-s/p radiation 2022-who presented with generalized weakness/acute metabolic encephalopathy-he was found to have HHS secondary to right diabetic foot (great/second toe) with gangrene/osteomyelitis, LV thrombus and acute CVA.  He was stabilized in the ICU-and subsequently transferred to [facility] on 2/16.  Significant events: 2/12>> admit to [facility]-HHS/encephalopathy/diabetic foot-developed hypotension-started on Levophed-transfer to ICU. 2/14>> RLE angiogram-small vessel disease. 2/16>> transferred to [facility].  Significant studies: 2/11>> A1c: 13.3 2/12>> MRI brain: Acute infarct right frontal lobe, additional small infarct right cerebellum, left frontal/parietal/occipital lobe. 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 2/12>> renal ultrasound: No hydronephrosis 2/12>> echo: EF 20-25%, LV thrombus.  Grade 2 diastolic dysfunction.  Moderate mitral valve regurgitation 2/13>> LDL: 49.  Significant microbiology data: 2/11>> COVID/influenza/RSV PCR: Negative 2/12>> blood culture: Negative.  Procedures: 2/14>> RLE angiogram by vascular surgery-small vessel disease. 2/19>> right first and second ray amputation.  Consults: PCCM Orthopedics Cardiology Neurology  Subjective: No major issues overnight-lying comfortably in bed.  Seen before and after his amputation.  Second time-numerous family members at bedside.  Objective: Vitals: Blood pressure 112/70, pulse 69, temperature 98 F (36.7 C), temperature source Oral, resp. rate 15, height 6' (1.829 m), weight 85.7 kg, SpO2 95%.   Exam: Gen Exam:Alert  awake-not in any distress HEENT:atraumatic, normocephalic Chest: B/L clear to auscultation anteriorly CVS:S1S2 regular Abdomen:soft non tender, non distended Extremities:+ edema-right leg covered in bandage-did not open. Neurology: Non focal Skin: no rash  Pertinent Labs/Radiology:    Latest Ref Rng & Units 06/21/2023    4:28 AM 06/20/2023    3:55 AM 06/19/2023   10:06 PM  CBC  WBC 4.0 - 10.5 K/uL 6.7  7.4    Hemoglobin 13.0 - 17.0 g/dL 08.6  9.6  9.4   Hematocrit 39.0 - 52.0 % 31.3  30.5  29.2   Platelets 150 - 400 K/uL 129  124      Lab Results  Component Value Date   NA 133 (L) 06/21/2023   K 4.0 06/21/2023   CL 109 06/21/2023   CO2 18 (L) 06/21/2023    Assessment/Plan: Septic shock secondary to right diabetic foot with wet gangrene/osteomyelitis involving great/second toe-s/p first and second ray amputation on 2/19 Sepsis physiology has resolved Afebrile No leukocytosis Continue broad-spectrum antibiotics-will discuss with Dr. Salome Arnt 2/20 regarding duration of antibiotics postoperatively.  Acute metabolic encephalopathy Suspect this is mostly from sepsis-with some component from CVA Mentation has improved with treatment of underlying issues.  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  Chronic HFrEF Volume status stable Continue beta-blocker/furosemide/digoxin Cardiology planning to add ARNI/SGLT2 inhibitors post surgery  LV thrombus IV heparin with plans to transition to DOAC post surgery  HHS Resolved-treated with IV fluids/IV insulin  AKI Likely hemodynamically mediated Slight bump in creatinine today-will recheck electrolytes tomorrow-watch closely for now.  DM-2 with poor control/uncontrolled hyperglycemia (A1c 13.3 on 2/11) CBGs are to be stable Semglee 15  units+  SSI Follow/optimize.  Recent Labs    06/21/23 0807 06/21/23 1101 06/21/23 1157  GLUCAP 203* 181* 189*    HTN BP relatively stable Continue metoprolol/diuretics Follow/optimize  Normocytic anemia Secondary to critical illness No evidence of blood loss S/p 1 unit of PRBC on 2/17-Hb currently stable.  Thrombocytopenia Likely due to sepsis physiology Slowly improving Follow.  Sick euthyroid syndrome TSH/free T4 mildly elevated Repeat thyroid function test in 4 to 6 weeks when he has recovered from acute illness.  BMI: Estimated body mass index is 25.62 kg/m as calculated from the following:   Height as of this encounter: 6' (1.829 m).   Weight as of this encounter: 85.7 kg.   Code status:   Code Status: Full Code   DVT Prophylaxis: SCD's Start: 06/21/23 1157 SCDs Start: 06/13/23 2205   Family Communication: Mother and 2 sons on 2/19   Disposition Plan: Status is: Inpatient Remains inpatient appropriate because: Severity of illness   Planned Discharge Destination:Skilled nursing facility   Diet: Diet Order             Diet Carb Modified Fluid consistency: Thin; Room service appropriate? No  Diet effective now                     Antimicrobial agents: Anti-infectives (From admission, onward)    Start     Dose/Rate Route Frequency Ordered Stop   06/21/23 1400  ceFAZolin (ANCEF) IVPB 1 g/50 mL premix  Status:  Discontinued        1 g 100 mL/hr over 30 Minutes Intravenous Every 8 hours 06/21/23 1156 06/21/23 1242   06/21/23 1048  vancomycin (VANCOCIN) powder  Status:  Discontinued          As needed 06/21/23 1048 06/21/23 1113   06/21/23 1000  ceFAZolin (ANCEF) IVPB 2g/100 mL premix        2 g 200 mL/hr over 30 Minutes Intravenous On call to O.R. 06/21/23 0602 06/21/23 1021   06/19/23 2200  vancomycin (VANCOREADY) IVPB 750 mg/150 mL        750 mg 150 mL/hr over 60 Minutes Intravenous Every 24 hours 06/19/23 1008     06/15/23 2200  vancomycin  (VANCOREADY) IVPB 1500 mg/300 mL  Status:  Discontinued        1,500 mg 150 mL/hr over 120 Minutes Intravenous Every 24 hours 06/15/23 1417 06/19/23 1008   06/15/23 1845  ceFEPIme (MAXIPIME) 2 g in sodium chloride 0.9 % 100 mL IVPB        2 g 200 mL/hr over 30 Minutes Intravenous Every 8 hours 06/15/23 1445     06/14/23 1543  vancomycin variable dose per unstable renal function (pharmacist dosing)  Status:  Discontinued         Does not apply See admin instructions 06/14/23 1543 06/16/23 0709   06/14/23 1045  ceFEPIme (MAXIPIME) 2 g in sodium chloride 0.9 % 100 mL IVPB  Status:  Discontinued        2 g 200 mL/hr over 30 Minutes Intravenous Every 8 hours 06/14/23 0955 06/14/23 0956   06/14/23 1045  metroNIDAZOLE (FLAGYL) tablet 500 mg  Status:  Discontinued        500 mg Oral Every 12 hours 06/14/23 0955 06/21/23 1242   06/14/23 1045  ceFEPIme (MAXIPIME) 2 g in sodium chloride 0.9 % 100 mL IVPB  Status:  Discontinued        2 g 200 mL/hr over 30 Minutes Intravenous Every 12 hours  06/14/23 0956 06/15/23 1445   06/14/23 1000  vancomycin (VANCOREADY) IVPB 1250 mg/250 mL  Status:  Discontinued        1,250 mg 166.7 mL/hr over 90 Minutes Intravenous Every 24 hours 06/14/23 0638 06/14/23 1545   06/14/23 0700  vancomycin (VANCOREADY) IVPB 1500 mg/300 mL        1,500 mg 150 mL/hr over 120 Minutes Intravenous  Once 06/14/23 0611 06/14/23 1938   06/14/23 0130  azithromycin (ZITHROMAX) 500 mg in sodium chloride 0.9 % 250 mL IVPB  Status:  Discontinued        500 mg 250 mL/hr over 60 Minutes Intravenous Daily at bedtime 06/14/23 0100 06/14/23 0611   06/14/23 0130  cefTRIAXone (ROCEPHIN) 1 g in sodium chloride 0.9 % 100 mL IVPB  Status:  Discontinued        1 g 200 mL/hr over 30 Minutes Intravenous Daily at bedtime 06/14/23 0100 06/14/23 1610        MEDICATIONS: Scheduled Meds:  vitamin C  1,000 mg Oral Daily   atorvastatin  80 mg Oral Daily   Chlorhexidine Gluconate Cloth  6 each Topical  Daily   digoxin  0.125 mg Oral Daily   feeding supplement  237 mL Oral BID BM   furosemide  40 mg Intravenous Daily   insulin aspart  0-15 Units Subcutaneous TID WC   insulin aspart  0-5 Units Subcutaneous QHS   insulin glargine-yfgn  15 Units Subcutaneous Daily   insulin starter kit- pen needles  1 kit Other Once   living well with diabetes book   Does not apply Once   metoprolol tartrate  25 mg Oral TID   multivitamin with minerals  1 tablet Oral Daily   nutrition supplement (JUVEN)  1 packet Oral BID BM   pantoprazole  40 mg Oral Daily   sodium bicarbonate  650 mg Oral TID   sodium chloride flush  10-40 mL Intracatheter Q12H   tamsulosin  0.4 mg Oral Daily   zinc sulfate (50mg  elemental zinc)  220 mg Oral Daily   Continuous Infusions:  sodium chloride     ceFEPime (MAXIPIME) IV Stopped (06/21/23 0214)   heparin Stopped (06/21/23 9604)   vancomycin Stopped (06/20/23 2327)   PRN Meds:.acetaminophen **OR** acetaminophen, ondansetron (ZOFRAN) IV, mouth rinse, sodium chloride flush   I have personally reviewed following labs and imaging studies  LABORATORY DATA: CBC: Recent Labs  Lab 06/17/23 0200 06/18/23 0321 06/19/23 0351 06/19/23 2206 06/20/23 0355 06/21/23 0428  WBC 11.1* 12.0* 8.0  --  7.4 6.7  HGB 7.8* 7.8* 7.3* 9.4* 9.6* 10.0*  HCT 24.7* 24.1* 23.4* 29.2* 30.5* 31.3*  MCV 86.4 86.1 87.0  --  84.5 83.2  PLT 122* 114* 110*  --  124* 129*    Basic Metabolic Panel: Recent Labs  Lab 06/16/23 0404 06/16/23 2031 06/17/23 0654 06/18/23 0321 06/19/23 0351 06/20/23 0355 06/21/23 0428  NA 131*  --  131* 133* 133* 135 133*  K 4.5  --  4.5 3.9 4.4 4.2 4.0  CL 104  --  107 108 110 110 109  CO2 19*  --  18* 15* 17* 16* 18*  GLUCOSE 176*  --  149* 91 241* 154* 160*  BUN 16  --  20 20 23 20 20   CREATININE 1.26*  --  1.36* 1.22 1.28* 1.24 1.44*  CALCIUM 7.6*  --  7.7* 7.9* 7.6* 7.8* 7.9*  MG 1.7  --  2.0  --  1.8 1.7 1.9  PHOS 1.6*  3.5 2.8  --  2.1* 2.1* 2.7     GFR: Estimated Creatinine Clearance: 57.6 mL/min (A) (by C-G formula based on SCr of 1.44 mg/dL (H)).  Liver Function Tests: Recent Labs  Lab 06/19/23 0351 06/20/23 0355  AST 12*  --   ALT 13  --   ALKPHOS 37*  --   BILITOT 0.6  --   PROT 5.6*  --   ALBUMIN <1.5* 1.6*   No results for input(s): "LIPASE", "AMYLASE" in the last 168 hours.  No results for input(s): "AMMONIA" in the last 168 hours.   Coagulation Profile: No results for input(s): "INR", "PROTIME" in the last 168 hours.   Cardiac Enzymes: No results for input(s): "CKTOTAL", "CKMB", "CKMBINDEX", "TROPONINI" in the last 168 hours.  BNP (last 3 results) No results for input(s): "PROBNP" in the last 8760 hours.  Lipid Profile: No results for input(s): "CHOL", "HDL", "LDLCALC", "TRIG", "CHOLHDL", "LDLDIRECT" in the last 72 hours.  Thyroid Function Tests: No results for input(s): "TSH", "T4TOTAL", "FREET4", "T3FREE", "THYROIDAB" in the last 72 hours.  Anemia Panel: No results for input(s): "VITAMINB12", "FOLATE", "FERRITIN", "TIBC", "IRON", "RETICCTPCT" in the last 72 hours.  Urine analysis:    Component Value Date/Time   COLORURINE YELLOW 05/03/2020 1303   APPEARANCEUR CLEAR 05/03/2020 1303   LABSPEC 1.022 05/03/2020 1303   PHURINE 6.0 05/03/2020 1303   GLUCOSEU >=500 (A) 05/03/2020 1303   HGBUR NEGATIVE 05/03/2020 1303   BILIRUBINUR NEGATIVE 05/03/2020 1303   KETONESUR 80 (A) 05/03/2020 1303   PROTEINUR NEGATIVE 05/03/2020 1303   NITRITE NEGATIVE 05/03/2020 1303   LEUKOCYTESUR NEGATIVE 05/03/2020 1303    Sepsis Labs: Lactic Acid, Venous    Component Value Date/Time   LATICACIDVEN 8.7 (HH) 06/14/2023 0452    MICROBIOLOGY: Recent Results (from the past 240 hours)  Resp panel by RT-PCR (RSV, Flu A&B, Covid) Anterior Nasal Swab     Status: None   Collection Time: 06/13/23  8:14 PM   Specimen: Anterior Nasal Swab  Result Value Ref Range Status   SARS Coronavirus 2 by RT PCR NEGATIVE NEGATIVE  Final   Influenza A by PCR NEGATIVE NEGATIVE Final   Influenza B by PCR NEGATIVE NEGATIVE Final    Comment: (NOTE) The Xpert Xpress SARS-CoV-2/FLU/RSV plus assay is intended as an aid in the diagnosis of influenza from Nasopharyngeal swab specimens and should not be used as a sole basis for treatment. Nasal washings and aspirates are unacceptable for Xpert Xpress SARS-CoV-2/FLU/RSV testing.  Fact Sheet for Patients: BloggerCourse.com  Fact Sheet for Healthcare Providers: SeriousBroker.it  This test is not yet approved or cleared by the [place] FDA and has been authorized for detection and/or diagnosis of SARS-CoV-2 by FDA under an Emergency Use Authorization (EUA). This EUA will remain in effect (meaning this test can be used) for the duration of the COVID-19 declaration under Section 564(b)(1) of the Act, 21 U.S.C. section 360bbb-3(b)(1), unless the authorization is terminated or revoked.     Resp Syncytial Virus by PCR NEGATIVE NEGATIVE Final    Comment: (NOTE) Fact Sheet for Patients: BloggerCourse.com  Fact Sheet for Healthcare Providers: SeriousBroker.it  This test is not yet approved or cleared by the [place] FDA and has been authorized for detection and/or diagnosis of SARS-CoV-2 by FDA under an Emergency Use Authorization (EUA). This EUA will remain in effect (meaning this test can be used) for the duration of the COVID-19 declaration under Section 564(b)(1) of the Act, 21 U.S.C. section 360bbb-3(b)(1), unless the authorization is terminated  or revoked.  Performed at [facility], [address] [place], [name] [address]   Culture, blood (Routine X 2) w Reflex to ID Panel     Status: None   Collection Time: 06/14/23  4:52 AM   Specimen: BLOOD  Result Value Ref Range Status   Specimen Description BLOOD RIGHT ANTECUBITAL  Final   Special Requests    Final    BOTTLES DRAWN AEROBIC AND ANAEROBIC Blood Culture results may not be optimal due to an inadequate volume of blood received in culture bottles   Culture   Final    NO GROWTH 5 DAYS Performed at [facility], [address], [place], [name] [address]    Report Status 06/19/2023 FINAL  Final  Culture, blood (Routine X 2) w Reflex to ID Panel     Status: None   Collection Time: 06/14/23  5:00 AM   Specimen: BLOOD  Result Value Ref Range Status   Specimen Description BLOOD RIGHT ANTECUBITAL  Final   Special Requests   Final    BOTTLES DRAWN AEROBIC AND ANAEROBIC Blood Culture results may not be optimal due to an inadequate volume of blood received in culture bottles   Culture   Final    NO GROWTH 5 DAYS Performed at [facility], [address], [place], [name] [address]    Report Status 06/19/2023 FINAL  Final  MRSA Next Gen by PCR, Nasal     Status: None   Collection Time: 06/14/23  5:15 AM   Specimen: Nasal Mucosa; Nasal Swab  Result Value Ref Range Status   MRSA by PCR Next Gen NOT DETECTED NOT DETECTED Final    Comment: (NOTE) The GeneXpert MRSA Assay (FDA approved for NASAL specimens only), is one component of a comprehensive MRSA colonization surveillance program. It is not intended to diagnose MRSA infection nor to guide or monitor treatment for MRSA infections. Test performance is not FDA approved in patients less than 69 years old. Performed at [facility], [address] [place], [name] [address]   Aerobic/Anaerobic Culture w Gram Stain (surgical/deep wound)     Status: None (Preliminary result)   Collection Time: 06/21/23 10:32 AM   Specimen: Soft Tissue, Other  Result Value Ref Range Status   Specimen Description TISSUE  Final   Special Requests   Final    SOFT TISSUE Performed at [facility], [address], [place], [name] [address]    Gram Stain PENDING  Incomplete   Culture PENDING  Incomplete   Report Status PENDING   Incomplete    RADIOLOGY STUDIES/RESULTS: No results found.   LOS: 8 days   Jeoffrey Massed, MD  [facility]    To contact the attending provider between 7A-7P or the covering provider during after hours 7P-7A, please log into the web site [email or web address] and access using universal [place] password for that web site. If you do not have the password, please call the hospital operator.  06/21/2023, 2:01 PM

## 2023-06-21 NOTE — Anesthesia Preprocedure Evaluation (Addendum)
Anesthesia Evaluation  Patient identified by MRN, date of birth, ID band Patient awake    Reviewed: Allergy & Precautions, NPO status , Patient's Chart, lab work & pertinent test results, reviewed documented beta blocker date and time   Airway Mallampati: II  TM Distance: >3 FB     Dental no notable dental hx. (+) Dental Advisory Given, Teeth Intact   Pulmonary former smoker   Pulmonary exam normal breath sounds clear to auscultation       Cardiovascular hypertension, Pt. on medications + CAD, + Past MI and +CHF   Rhythm:Regular Rate:Tachycardia  Echo 06/14/23 1. Large 3 x 3 cm round heterogenous mass at the LV apex which is  suggestive of thrombus given severe LV dysfunction and less likely myxoma  - the mass did not take up Definity contrast (appears avascular). Left  ventricular ejection fraction, by  estimation, is 20 to 25%. Left ventricular ejection fraction by 2D MOD  biplane is 21.2 %. The left ventricle has severely decreased function. The  left ventricle demonstrates global hypokinesis. The left ventricular  internal cavity size was mildly  dilated. Left ventricular diastolic parameters are consistent with Grade  II diastolic dysfunction (pseudonormalization). Elevated left ventricular  end-diastolic pressure.   2. Right ventricular systolic function is mildly reduced. The right  ventricular size is normal. There is mildly elevated pulmonary artery  systolic pressure. The estimated right ventricular systolic pressure is  39.0 mmHg.   3. The mitral valve is abnormal. Moderate mitral valve regurgitation.   4. The aortic valve is tricuspid. Aortic valve regurgitation is not  visualized.   5. The inferior vena cava is dilated in size with <50% respiratory  variability, suggesting right atrial pressure of 15 mmHg.   EKG 06/16/23 ST 131/min, LBBB pattern    Neuro/Psych  PSYCHIATRIC DISORDERS  Depression    Right hand  weakness CVA, Residual Symptoms    GI/Hepatic negative GI ROS, Neg liver ROS,,,  Endo/Other  diabetes, Well Controlled, Type 2, Oral Hypoglycemic Agents  Hyperlipidemia  Renal/GU Renal InsufficiencyRenal disease   Hx/o Prostate Ca S/P RT 2022 negative genitourinary   Musculoskeletal  (+) Arthritis , Osteoarthritis,  Osteomyelitis right foot   Abdominal   Peds  Hematology  (+) Blood dyscrasia, anemia Thrombocytopenia Sepsis due to osteomyelitis    Anesthesia Other Findings   Reproductive/Obstetrics                               Anesthesia Physical Anesthesia Plan  ASA: 3  Anesthesia Plan: General   Post-op Pain Management: Minimal or no pain anticipated and Dilaudid IV   Induction: Intravenous  PONV Risk Score and Plan: 3 and Treatment may vary due to age or medical condition, Ondansetron, Propofol infusion and TIVA  Airway Management Planned: LMA  Additional Equipment: None  Intra-op Plan:   Post-operative Plan: Extubation in OR  Informed Consent: I have reviewed the patients History and Physical, chart, labs and discussed the procedure including the risks, benefits and alternatives for the proposed anesthesia with the patient or authorized representative who has indicated his/her understanding and acceptance.     Dental advisory given  Plan Discussed with: CRNA and Anesthesiologist  Anesthesia Plan Comments:         Anesthesia Quick Evaluation

## 2023-06-21 NOTE — Anesthesia Postprocedure Evaluation (Signed)
Anesthesia Post Note  Patient: Derek Thompson  Procedure(s) Performed: RIGHT 2ND RAY AMPUTATION (Right: Foot)     Patient location during evaluation: PACU Anesthesia Type: General Level of consciousness: awake and alert and oriented Pain management: pain level controlled Vital Signs Assessment: post-procedure vital signs reviewed and stable Respiratory status: spontaneous breathing, nonlabored ventilation and respiratory function stable Cardiovascular status: blood pressure returned to baseline and stable Postop Assessment: no apparent nausea or vomiting Anesthetic complications: no   No notable events documented.  Last Vitals:  Vitals:   06/21/23 0918 06/21/23 0935  BP: 125/70   Pulse: 82 77  Resp: 18 17  Temp: 36.6 C   SpO2: 95% 95%    Last Pain:  Vitals:   06/21/23 0918  TempSrc: Oral  PainSc: 0-No pain                 Selina Tapper A.

## 2023-06-21 NOTE — Progress Notes (Addendum)
PHARMACY - ANTICOAGULATION CONSULT NOTE  Pharmacy Consult for Heparin Indication:  LV thrombus + thromboembolic vs ischemic stroke  No Known Allergies  Patient Measurements: Height: 6' (182.9 cm) Weight: 85.7 kg (188 lb 15 oz) IBW/kg (Calculated) : 77.6 Heparin Dosing Weight: 85.7 kg  Vital Signs: Temp: 97.6 F (36.4 C) (02/19 0326) Temp Source: Oral (02/19 0326) BP: 109/65 (02/19 0326) Pulse Rate: 74 (02/19 0326)  Labs: Recent Labs    06/19/23 0351 06/19/23 2206 06/20/23 0355 06/20/23 1810 06/21/23 0428  HGB 7.3* 9.4* 9.6*  --  10.0*  HCT 23.4* 29.2* 30.5*  --  31.3*  PLT 110*  --  124*  --  129*  HEPARINUNFRC 0.50  --  0.62 0.48 0.45  CREATININE 1.28*  --  1.24  --  1.44*    Estimated Creatinine Clearance: 57.6 mL/min (A) (by C-G formula based on SCr of 1.44 mg/dL (H)).  Assessment: 63 y.o M w/ PMHx of poorly controlled T2DM, HFrEF (LVEF 25%), LBBB, G2DD and LV global hypokinesis, HTN, CAD, Prostate Cancer treated w/ radiation 2022 who presented to ED for 1 week of progressive generalized weakness, 2 days AMS, admitted for suspected HHS. 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 Note patient also with acute/subacute infarct in R frontal lobe, with suspicion for embolic etiology.   Heparin level is therapeutic (0.45) on 1700 units/hr. Targeting low-therapeutic heparin levels.  Hgb improved after 2 units PRBCs on 2/17; platelet count low stable. No bleeding reported.  Goal of Therapy:  Heparin level 0.3-0.5 units/ml (targeting low end for concomitant CVA w/ petechial hemorrhage) Monitor platelets by anticoagulation protocol: Yes   Plan:  Continue heparin drip at 1700 units/hr Daily heparin level and CBC while on heparin. For OR today at 11:30; anticipate heparin drip will be held on call to OR Follow up for resuming anticoagulation post-op. Follow up for transition to oral anticoagulation (expect Eliquis)  post-op when able.  Addendum: ~2:15pm.  May resume IV heparin drip now per Dr. Lajoyce Corners.  Next labs in am.  Dennie Fetters, RPh 06/21/2023,8:05 AM Addendum: 2:17 PM

## 2023-06-21 NOTE — TOC Progression Note (Signed)
Transition of Care [facility]) - Progression Note    Patient Details  Name: Derek Thompson MRN: [id number] Date of Birth: November 23, 1960  Transition of Care [facility]) CM/SW Contact  Mearl Latin, LCSW Phone Number: 06/21/2023, 3:05 PM  Clinical Narrative:    CSW faxed short term disability form to [facility] office as directed by MD.   Expected Discharge Plan: Long Term Acute Care (LTAC) Barriers to Discharge: Continued Medical Work up, English as a second language teacher  Expected Discharge Plan and Services In-house Referral: Clinical Social Work   Post Acute Care Choice: Long Term Acute Care (LTAC), Skilled Nursing Facility Living arrangements for the past 2 months: Hotel/Motel                                       Social Determinants of Health (SDOH) Interventions SDOH Screenings   Food Insecurity: No Food Insecurity (06/14/2023)  Housing: Low Risk  (06/14/2023)  Transportation Needs: No Transportation Needs (06/14/2023)  Utilities: Not At Risk (06/14/2023)  Alcohol Screen: Low Risk  (08/19/2022)  Depression (PHQ2-9): Medium Risk (11/22/2022)  Financial Resource Strain: Low Risk  (08/19/2022)  Tobacco Use: Medium Risk (06/21/2023)    Readmission Risk Interventions     No data to display

## 2023-06-21 NOTE — Interval H&P Note (Signed)
History and Physical Interval Note:  06/21/2023 6:42 AM  Derek Thompson  has presented today for surgery, with the diagnosis of Osteomyelitis Right 2nd Toe.  The various methods of treatment have been discussed with the patient and family. After consideration of risks, benefits and other options for treatment, the patient has consented to  Procedure(s): RIGHT 2ND RAY AMPUTATION (Right) as a surgical intervention.  The patient's history has been reviewed, patient examined, no change in status, stable for surgery.  I have reviewed the patient's chart and labs.  Questions were answered to the patient's satisfaction.     Nadara Mustard

## 2023-06-21 NOTE — Progress Notes (Signed)
At bedside for L CVC (internal jugular) dressing change. It is noted the line is not being used. Pt  has 2 functioning PIV sites infusing without issue. Per Dr. Jerral Ralph, ok to remove CVC.

## 2023-06-21 NOTE — Op Note (Signed)
06/21/2023  10:57 AM  PATIENT:  Derek Thompson    PRE-OPERATIVE DIAGNOSIS:  Osteomyelitis Right 2nd Toe and great toe.  POST-OPERATIVE DIAGNOSIS: Osteomyelitis right great toe and right second toe with large abscess and necrotic soft tissue.  PROCEDURE:  RIGHT FIRST AND 2ND RAY AMPUTATION Local tissue transfer for wound closure 10 x 3 cm. Application Kerecis micro graft 38 cm. Application vancomycin powder 1 g.  SURGEON:  Nadara Mustard, MD  PHYSICIAN ASSISTANT:None ANESTHESIA:   General  PREOPERATIVE INDICATIONS:  Derek Thompson is a  63 y.o. male with a diagnosis of Osteomyelitis Right 2nd Toe who failed conservative measures and elected for surgical management.    The risks benefits and alternatives were discussed with the patient preoperatively including but not limited to the risks of infection, bleeding, nerve injury, cardiopulmonary complications, the need for revision surgery, among others, and the patient was willing to proceed.  OPERATIVE IMPLANTS:   Implant Name Type Inv. Item Serial No. Manufacturer Lot No. LRB No. Used Action  GRAFT SKIN WND MICRO 38 - [id number] Tissue GRAFT SKIN WND MICRO 38  [employer] [id number] Right 1 Implanted    @ENCIMAGES @  OPERATIVE FINDINGS: Patient had a large deep abscess with necrotic tissue necessitating excision of excessive soft tissue leaving a large soft tissue defect as well as resecting of the first and second rays.  Tissue was sent for cultures.  Patient will need 3 weeks of oral antibiotics depending on culture sensitivities.  OPERATIVE PROCEDURE: Patient brought the operating room underwent a general anesthetic.  After adequate levels anesthesia were obtained patient's right lower extremity was prepped using DuraPrep draped into a sterile field a timeout was called.  Elliptical incision was made around the first and second rays.  There is a large necrotic abscess surrounding the second metatarsal head and neck with necrotic  tissue plantarly.  The amputation was extended proximally to the metatarsal necks of the first and second metatarsal.  The necrotic soft tissue was sent for cultures.  Tissue margins were debrided back to healthy viable tissue.  The wound was 10 x 3 cm after debridement.  Tissue margins were undermined to allow for local tissue transfer.  The wound was irrigated with Vashe irrigation.  There was good petechial bleeding.  The wound bed was filled with 38 cm of Kerecis micro graft and 1 g vancomycin powder.  Biologic tissue graft to help fill the large soft tissue defect.  Antibiotics to provide coverage until sensitivities are obtained.  Local tissue transfer was used to rearrange the local soft tissue for wound closure.  Wound was closed with 2-0 nylon.  Sterile dressing was applied patient was extubated taken to PACU in stable condition.   DISCHARGE PLANNING:  Antibiotic duration: Continue vancomycin and order placed for Kefzol for 3 days.  Weightbearing: Ideally touchdown weightbearing on the right.  Patient may weight-bear if it is necessary balance.  Pain medication: Continue current pain medication  Dressing care/ Wound VAC: Dry dressing reinforce as needed  Ambulatory devices: Walker  Discharge to: Discharge planning based on therapy recommendations.  Follow-up: In the office 1 week post operative.

## 2023-06-21 NOTE — Progress Notes (Signed)
Orthopedic Tech Progress Note Patient Details:  Derek Thompson 08-15-1960 [id number]  Ortho Devices Type of Ortho Device: Postop shoe/boot Ortho Device/Splint Location: rle Ortho Device/Splint Interventions: Ordered, Other (comment)   Post Interventions Patient Tolerated: Well Instructions Provided: Care of device  Donald Pore 06/21/2023, 2:13 PM

## 2023-06-21 NOTE — Plan of Care (Signed)
   Problem: Coping: Goal: Level of anxiety will decrease Outcome: Progressing

## 2023-06-21 NOTE — Transfer of Care (Signed)
Immediate Anesthesia Transfer of Care Note  Patient: Derek Thompson  Procedure(s) Performed: RIGHT 2ND RAY AMPUTATION (Right: Foot)  Patient Location: PACU  Anesthesia Type:General  Level of Consciousness: drowsy, patient cooperative, and responds to stimulation  Airway & Oxygen Therapy: Patient Spontanous Breathing and Patient connected to face mask oxygen  Post-op Assessment: Report given to RN and Post -op Vital signs reviewed and stable  Post vital signs: Reviewed and stable  Last Vitals:  Vitals Value Taken Time  BP 105/62 06/21/23 1057  Temp    Pulse 67 06/21/23 1100  Resp 15 06/21/23 1100  SpO2 97 % 06/21/23 1100  Vitals shown include unfiled device data.  Last Pain:  Vitals:   06/21/23 0918  TempSrc: Oral  PainSc: 0-No pain         Complications: No notable events documented.

## 2023-06-21 NOTE — Progress Notes (Signed)
PT Cancellation Note  Patient Details Name: Claire Bridge MRN: [id number] DOB: 1960/06/15   Cancelled Treatment:    Reason Eval/Treat Not Completed: Patient at procedure or test/unavailable (Pt off the floor for procedure. Will follow up tomorrow.)   [name] 06/21/2023, 9:22 AM

## 2023-06-22 ENCOUNTER — Encounter (HOSPITAL_COMMUNITY): Payer: Self-pay | Admitting: Orthopedic Surgery

## 2023-06-22 DIAGNOSIS — I4729 Other ventricular tachycardia: Secondary | ICD-10-CM | POA: Diagnosis not present

## 2023-06-22 DIAGNOSIS — I5022 Chronic systolic (congestive) heart failure: Secondary | ICD-10-CM | POA: Diagnosis not present

## 2023-06-22 DIAGNOSIS — A419 Sepsis, unspecified organism: Secondary | ICD-10-CM | POA: Diagnosis not present

## 2023-06-22 DIAGNOSIS — I240 Acute coronary thrombosis not resulting in myocardial infarction: Secondary | ICD-10-CM | POA: Diagnosis not present

## 2023-06-22 LAB — GLUCOSE, CAPILLARY
Glucose-Capillary: 145 mg/dL — ABNORMAL HIGH (ref 70–99)
Glucose-Capillary: 256 mg/dL — ABNORMAL HIGH (ref 70–99)
Glucose-Capillary: 269 mg/dL — ABNORMAL HIGH (ref 70–99)
Glucose-Capillary: 75 mg/dL (ref 70–99)

## 2023-06-22 LAB — BASIC METABOLIC PANEL
Anion gap: 6 (ref 5–15)
BUN: 18 mg/dL (ref 8–23)
CO2: 17 mmol/L — ABNORMAL LOW (ref 22–32)
Calcium: 8.1 mg/dL — ABNORMAL LOW (ref 8.9–10.3)
Chloride: 112 mmol/L — ABNORMAL HIGH (ref 98–111)
Creatinine, Ser: 1.42 mg/dL — ABNORMAL HIGH (ref 0.61–1.24)
GFR, Estimated: 56 mL/min — ABNORMAL LOW (ref >60)
Glucose, Bld: 81 mg/dL (ref 70–99)
Potassium: 4.5 mmol/L (ref 3.5–5.1)
Sodium: 135 mmol/L (ref 135–145)

## 2023-06-22 LAB — CBC
HCT: 32.7 % — ABNORMAL LOW (ref 39.0–52.0)
Hemoglobin: 10.5 g/dL — ABNORMAL LOW (ref 13.0–17.0)
MCH: 26.9 pg (ref 26.0–34.0)
MCHC: 32.1 g/dL (ref 30.0–36.0)
MCV: 83.8 fL (ref 80.0–100.0)
Platelets: 151 10*3/uL (ref 150–400)
RBC: 3.9 MIL/uL — ABNORMAL LOW (ref 4.22–5.81)
RDW: 20.2 % — ABNORMAL HIGH (ref 11.5–15.5)
WBC: 10.6 10*3/uL — ABNORMAL HIGH (ref 4.0–10.5)
nRBC: 0 % (ref 0.0–0.2)

## 2023-06-22 LAB — HEPARIN LEVEL (UNFRACTIONATED): Heparin Unfractionated: 0.26 [IU]/mL — ABNORMAL LOW (ref 0.30–0.70)

## 2023-06-22 MED ORDER — SPIRONOLACTONE 12.5 MG HALF TABLET
12.5000 mg | ORAL_TABLET | Freq: Every day | ORAL | Status: DC
  Administered 2023-06-22: 12.5 mg via ORAL
  Filled 2023-06-22: qty 1

## 2023-06-22 MED ORDER — FUROSEMIDE 20 MG PO TABS
20.0000 mg | ORAL_TABLET | Freq: Every day | ORAL | Status: DC
  Administered 2023-06-22 – 2023-06-23 (×2): 20 mg via ORAL
  Filled 2023-06-22 (×2): qty 1

## 2023-06-22 MED ORDER — LINEZOLID 600 MG PO TABS
600.0000 mg | ORAL_TABLET | Freq: Two times a day (BID) | ORAL | Status: DC
Start: 2023-06-22 — End: 2023-06-26
  Administered 2023-06-22 – 2023-06-25 (×8): 600 mg via ORAL
  Filled 2023-06-22 (×8): qty 1

## 2023-06-22 MED ORDER — APIXABAN 5 MG PO TABS
5.0000 mg | ORAL_TABLET | Freq: Two times a day (BID) | ORAL | Status: DC
  Administered 2023-06-22 – 2023-06-29 (×15): 5 mg via ORAL
  Filled 2023-06-22 (×15): qty 1

## 2023-06-22 MED ORDER — SODIUM CHLORIDE 0.9 % IV SOLN
3.0000 g | Freq: Four times a day (QID) | INTRAVENOUS | Status: DC
  Administered 2023-06-22 – 2023-06-25 (×11): 3 g via INTRAVENOUS
  Filled 2023-06-22 (×11): qty 8

## 2023-06-22 MED ORDER — SACUBITRIL-VALSARTAN 24-26 MG PO TABS
1.0000 | ORAL_TABLET | Freq: Two times a day (BID) | ORAL | Status: DC
  Administered 2023-06-22 (×2): 1 via ORAL
  Filled 2023-06-22 (×3): qty 1

## 2023-06-22 MED ORDER — METOPROLOL TARTRATE 25 MG PO TABS
25.0000 mg | ORAL_TABLET | Freq: Two times a day (BID) | ORAL | Status: DC
  Administered 2023-06-22 – 2023-06-24 (×5): 25 mg via ORAL
  Filled 2023-06-22 (×6): qty 1

## 2023-06-22 NOTE — TOC Progression Note (Addendum)
Transition of Care [facility]) - Progression Note    Patient Details  Name: Derek Thompson MRN: [id number] Date of Birth: [birth year]  [facility]) CM/SW Contact  Mearl Latin, LCSW Phone Number: 06/22/2023, 12:41 PM  Clinical Narrative:    CSW and RNCM met with patient and family  (mother, brother, son [place] Calix [phone], another relative) at bedside. [facility] has received denial. CSW provided update and Medicaid resource. They will review SNF options and let CSW know decision tomorrow morning.    Expected Discharge Plan: Skilled Nursing Facility Barriers to Discharge: Continued Medical Work up, English as a second language teacher  Expected Discharge Plan and Services In-house Referral: Clinical Social Work   Post Acute Care Choice: Long Term Acute Care (LTAC), Skilled Nursing Facility Living arrangements for the past 2 months: Hotel/Motel                                       Social Determinants of Health (SDOH) Interventions SDOH Screenings   Food Insecurity: No Food Insecurity (06/14/2023)  Housing: Low Risk  (06/14/2023)  Transportation Needs: No Transportation Needs (06/14/2023)  Utilities: Not At Risk (06/14/2023)  Alcohol Screen: Low Risk  (08/19/2022)  Depression (PHQ2-9): Medium Risk (11/22/2022)  Financial Resource Strain: Low Risk  (08/19/2022)  Tobacco Use: Medium Risk (06/21/2023)    Readmission Risk Interventions     No data to display

## 2023-06-22 NOTE — TOC Progression Note (Addendum)
Transition of Care [facility]) - Progression Note    Patient Details  Name: Derek Thompson MRN: [id number] Date of Birth: 1960-07-10  Transition of Care [facility]) CM/SW Contact  Mearl Latin, LCSW Phone Number: 06/22/2023, 9:05 AM  Clinical Narrative:    [facility] awaiting insurance determination.   CSW sent request to Financial Counseling to assess patient for Medicaid and Disability applications.    Expected Discharge Plan: Long Term Acute Care (LTAC) Barriers to Discharge: Continued Medical Work up, Conservator, museum/gallery and Services In-house Referral: Clinical Social Work   Post Acute Care Choice: Long Term Acute Care (LTAC), Skilled Nursing Facility Living arrangements for the past 2 months: Hotel/Motel                                       Social Determinants of Health (SDOH) Interventions SDOH Screenings   Food Insecurity: No Food Insecurity (06/14/2023)  Housing: Low Risk  (06/14/2023)  Transportation Needs: No Transportation Needs (06/14/2023)  Utilities: Not At Risk (06/14/2023)  Alcohol Screen: Low Risk  (08/19/2022)  Depression (PHQ2-9): Medium Risk (11/22/2022)  Financial Resource Strain: Low Risk  (08/19/2022)  Tobacco Use: Medium Risk (06/21/2023)    Readmission Risk Interventions     No data to display

## 2023-06-22 NOTE — Progress Notes (Signed)
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  Inpatient Medications    Scheduled Meds:  vitamin C  1,000 mg Oral Daily   atorvastatin  80 mg Oral Daily   Chlorhexidine Gluconate Cloth  6 each Topical Daily   digoxin  0.125 mg Oral Daily   feeding supplement  237 mL Oral BID BM   furosemide  20 mg Oral Daily   insulin aspart  0-15 Units Subcutaneous TID WC   insulin aspart  0-5 Units Subcutaneous QHS   insulin glargine-yfgn  15 Units Subcutaneous Daily   insulin starter kit- pen needles  1 kit Other Once   living well with diabetes book   Does not apply Once   metoprolol tartrate  25 mg Oral BID   multivitamin with minerals  1 tablet Oral Daily   nutrition supplement (JUVEN)  1 packet Oral BID BM   pantoprazole  40 mg Oral Daily   sacubitril-valsartan  1 tablet Oral BID   sodium bicarbonate  650 mg Oral TID   sodium chloride flush  10-40 mL Intracatheter Q12H   spironolactone  12.5 mg Oral Q2200   tamsulosin  0.4 mg Oral Daily   zinc sulfate (50mg  elemental zinc)  220 mg Oral Daily   Continuous Infusions:  sodium chloride Stopped (06/21/23 1531)   ceFEPime (MAXIPIME) IV 2 g (06/22/23 0904)   heparin 1,750 Units/hr (06/22/23 0858)   vancomycin Stopped (06/21/23 2322)   PRN Meds: acetaminophen **OR** acetaminophen, ondansetron (ZOFRAN) IV, mouth rinse, sodium chloride flush   Vital Signs    Vitals:   06/21/23 1913 06/22/23 0000 06/22/23 0400 06/22/23 0500  BP:      Pulse:      Resp:      Temp: 97.9 F (36.6 C) 97.6 F (36.4 C) 98.2 F (36.8 C)   TempSrc: Oral Oral Oral   SpO2:      Weight:    88.6 kg  Height:        Intake/Output Summary (Last 24 hours) at 06/22/2023  9604 Last data filed at 06/22/2023 0600 Gross per 24 hour  Intake 1344.24 ml  Output 820 ml  Net 524.24 ml   Net IO Since Admission: -1,958.07 mL [06/22/23 0927]     06/22/2023    5:00 AM 06/21/2023    3:28 AM 06/20/2023    3:13 AM  Last 3 Weights  Weight (lbs) 195 lb 5.2 oz 188 lb 15 oz 192 lb 0.3 oz  Weight (kg) 88.6 kg 85.7 kg 87.1 kg      Telemetry    Sinus rhythm without NSVT (asymptomatic, longest episode 4 beats)- Personally Reviewed  ECG    No new EKGs- Personally Reviewed  Physical Exam   Physical Exam  Constitutional:  Age appropriate, hemodynamically stable, no acute distress.   Neck: No JVD present.  Left IJ central line  Cardiovascular: Normal rate, regular rhythm, S1 normal and S2 normal. Exam reveals decreased pulses. Exam reveals no gallop and no friction rub.  Murmur heard. Holosystolic murmur is present with a grade of 3/6 at the apex. Pulmonary/Chest: Breath sounds normal. He has no wheezes. He has no rales. He exhibits no tenderness.  Abdominal: Soft. Bowel sounds are normal. He exhibits no distension.  There is no abdominal tenderness.  Musculoskeletal:        General: Edema (Trace bilateral) present. No tenderness.     Comments: Bilateral lower extremity wounds on the phalanges.  Right lower extremity warm to touch postoperative dressing present.  Skin: Skin is warm and dry.    Labs    High Sensitivity Troponin:   Recent Labs  Lab 06/14/23 1405  TROPONINIHS 66*     Chemistry Recent Labs  Lab 06/19/23 0351 06/20/23 0355 06/21/23 0428 06/22/23 0520  NA 133* 135 133* 135  K 4.4 4.2 4.0 4.5  CL 110 110 109 112*  CO2 17* 16* 18* 17*  GLUCOSE 241* 154* 160* 81  BUN 23 20 20 18   CREATININE 1.28* 1.24 1.44* 1.42*  CALCIUM 7.6* 7.8* 7.9* 8.1*  MG 1.8 1.7 1.9  --   PROT 5.6*  --   --   --   ALBUMIN <1.5* 1.6*  --   --   AST 12*  --   --   --   ALT 13  --   --   --   ALKPHOS 37*  --   --   --   BILITOT 0.6  --   --   --   GFRNONAA >60  >60 55* 56*  ANIONGAP 6 9 6 6     Lipids  No results for input(s): "CHOL", "TRIG", "HDL", "LABVLDL", "LDLCALC", "CHOLHDL" in the last 168 hours.   Hematology Recent Labs  Lab 06/20/23 0355 06/21/23 0428 06/22/23 0520  WBC 7.4 6.7 10.6*  RBC 3.61* 3.76* 3.90*  HGB 9.6* 10.0* 10.5*  HCT 30.5* 31.3* 32.7*  MCV 84.5 83.2 83.8  MCH 26.6 26.6 26.9  MCHC 31.5 31.9 32.1  RDW 20.1* 20.0* 20.2*  PLT 124* 129* 151   Thyroid  No results for input(s): "TSH", "FREET4" in the last 168 hours.   BNP Recent Labs  Lab 06/19/23 0351  BNP 2,164.5*    DDimer No results for input(s): "DDIMER" in the last 168 hours.   Radiology    NA  Cardiac Studies   Echo 06/14/2023  1. Large 3 x 3 cm round heterogenous mass at the LV apex which is  suggestive of thrombus given severe LV dysfunction and less likely myxoma  - the mass did not take up Definity contrast (appears avascular). Left  ventricular ejection fraction, by  estimation, is 20 to 25%. Left ventricular ejection fraction by 2D MOD  biplane is 21.2 %. The left ventricle has severely decreased function. The  left ventricle demonstrates global hypokinesis. The left ventricular  internal cavity size was mildly  dilated. Left ventricular diastolic parameters are consistent with Grade  II diastolic dysfunction (pseudonormalization). Elevated left ventricular  end-diastolic pressure.   2. Right ventricular systolic function is mildly reduced. The right  ventricular size is normal. There is mildly elevated pulmonary artery  systolic pressure. The estimated right ventricular systolic pressure is  39.0 mmHg.   3. The mitral valve is abnormal. Moderate mitral valve regurgitation.   4. The aortic valve is tricuspid. Aortic valve regurgitation is not  visualized.   5. The inferior vena cava is dilated in size with <50% respiratory  variability, suggesting right atrial pressure of 15 mmHg.   Comparison(s): Changes from prior study are noted.  08/16/2022: LVEF 25%,  global hypokinesis.   Conclusion(s)/Recommendation(s): Critical findings reported to Dr. Francine Graven  and Elmer Picker, NP and acknowledged at 12:23 pm on 06/14/23.   Patient Profile     63  y.o. 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 Francine Graven.   Assessment & Plan    Chronic HFrEF - 08/2022 echo: LVEF 25% - 10/2022 cath: nonobstrutive CAD, CI 2.87, PCWP 15, PA mean 19 - 06/2023 echo: LVEF 20-25%, grade II dd, mild RV dysfunction, mod MR Net IO Since Admission: -1,958.07 mL [06/22/23 0927] Repeat BNP - improving Continue aspirin 81 mg p.o. daily. Continue atorvastatin 80 mg p.o. daily. Continue digoxin 125 mcg p.o. daily. Will change IV Lasix 40 mg p.o. daily to Lasix 20 mg p.o. daily  Continue Lopressor to 25 mg p.o. 3 times daily Start Entresto 24/26 mg p.o. twice daily with holding parameters parameters. Start spironolactone 12.5 mg p.o. 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 Currently on IV heparin drip -May transition to oral anticoagulation once cleared by surgical team for no additional surgical interventions. Recommend repeating either an echocardiogram with contrast or cardiac MRI in 3 months after being on anticoagulation to make sure that the LV thrombus has resolved.  Septic shock secondary to osteomyelitis and right foot wet gangrene: Currently on broad-spectrum IV antibiotics and patient underwent first and second ray amputation on Wednesday, 06/21/2023.  NSVT:  Asymptomatic, noted on telemetry. With up titration of AV  nodal blocking agents no reoccurrence on telemetry. Has undergone ischemic workup in the recent past-2024.  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 Cardio embolic secondary to LV thrombus. Currently on IV heparin gtt. Seen by neurology. Primary following.   IDDM: Poorly controlled diabetic, with hemoglobin A1c 13.3, currently on insulin  HLD: LDL at goal. Continue Lipitor 80 mg po at bedtime  HTN: BP stable. Medications as noted above.   Diabetic right second toe infection.  Seen by vascular and orthopedic surgery  Underwent amputation on 06/21/2023  Non compliance w/ medical therapy.  Decided to stop insulin tx prior to arrival - per EMR.      For questions or updates, please contact [facility] Please consult [email or web address] for contact info under     Signed, Tessa Lerner, DO, [facility]  [facility]  [name] [place], [name] [address] Pager: [phone] Office: [phone] 06/22/2023, 9:27 AM

## 2023-06-22 NOTE — Progress Notes (Signed)
Patient ID: Derek Thompson, male   DOB: 1961/01/05, 63 y.o.   MRN: [id number] Patient is postoperative day 1 first and second ray amputation.  Patient had a large necrotic soft tissue wound adjacent to the first and second metatarsal heads.  Tissue cultures are showing gram-positive cocci.  The wound was cleansed with Vashe and vancomycin powder and Kerecis micro graft were applied within the wound.  Anticipate patient will need 3 weeks of oral antibiotics pending culture sensitivities.  Recommended minimizing weightbearing right foot.

## 2023-06-22 NOTE — Progress Notes (Signed)
Physical Therapy Treatment Patient Details Name: Derek Thompson MRN: [id number] DOB: [birth year] Today's Date: 06/22/2023   History of Present Illness Pt is a 63 y/o M presenting to Ed on 2/11 with 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 PMH: poorly controlled DM2, chronic diastolic CHF, chronic R sided heart failure, anemia of chronic disease, L bundle branch block    PT Comments  Pt with fair tolerance to treatment today. Pt is now s/p R 1st and 2nd Ray amputation yesterday and is now TDWB RLE. Pt today was able to transfer from chair to bed with +2 Mod A however demonstrated very poor adherence to WB precautions. When asked to repeat his precautions pt would respond "zero" while simultaneously placing full weight through foot. Patient will benefit from continued inpatient follow up therapy, <3 hours/day. PT will continue to follow.      If plan is discharge home, recommend the following: A little help with walking and/or transfers;A lot of help with bathing/dressing/bathroom;Assistance with cooking/housework;Help with stairs or ramp for entrance;Assist for transportation   Can travel by private vehicle     No  Equipment Recommendations  Rolling walker (2 wheels);Wheelchair (measurements PT);Wheelchair cushion (measurements PT);Hospital bed    Recommendations for Other Services       Precautions / Restrictions Precautions Precautions: Fall Recall of Precautions/Restrictions: Impaired Restrictions Weight Bearing Restrictions Per Provider Order: Yes RLE Weight Bearing Per Provider Order: Touchdown weight bearing Other Position/Activity Restrictions: wear post op shoe/boot when performing functional mobility     Mobility  Bed Mobility Overal bed mobility: Needs Assistance Bed Mobility: Sit to Supine       Sit to supine: +2 for physical assistance, Max assist    General bed mobility comments: increased time to initiate task. verbal cues for sequencing. +2 Max A via helicopter method.    Transfers Overall transfer level: Needs assistance Equipment used: Rolling walker (2 wheels) Transfers: Sit to/from Stand, Bed to chair/wheelchair/BSC Sit to Stand: +2 physical assistance, From elevated surface, Mod assist   Step pivot transfers: +2 physical assistance, Mod assist       General transfer comment: unable to adhere to RLE WB precautions. Impaired coordination and awareness of LUE.    Ambulation/Gait               General Gait Details: Deferred due to poor adherence of WB precautions.   Stairs             Wheelchair Mobility     Tilt Bed    Modified Rankin (Stroke Patients Only)       Balance Overall balance assessment: Needs assistance Sitting-balance support: Bilateral upper extremity supported, Feet supported Sitting balance-Leahy Scale: Fair Sitting balance - Comments: static sitting EOB   Standing balance support: Bilateral upper extremity supported, During functional activity, Reliant on assistive device for balance Standing balance-Leahy Scale: Poor Standing balance comment: Reliant on RW for support. Difficulty maintaining balance when adhering to RLE WB precautions                            Communication Communication Communication: Impaired Factors Affecting Communication: Reduced clarity of speech  Cognition Arousal: Alert Behavior During Therapy: Flat affect   PT - Cognitive impairments: Problem solving                       PT -  Cognition Comments: pt able to follow commands however noted with slow processing. Following commands: Impaired Following commands impaired: Follows one step commands inconsistently    Cueing Cueing Techniques: Verbal cues, Tactile cues  Exercises General Exercises - Upper Extremity Chair Push Up: AROM, Strengthening, Both, Other reps (comment)  (Pt only able to perform 1 rep after max cueing for sequencing.)    General Comments General comments (skin integrity, edema, etc.): VSS on RA, family present      Pertinent Vitals/Pain Pain Assessment Pain Assessment: Faces Faces Pain Scale: Hurts little more Pain Location: Discomfort with positioning in bed Pain Descriptors / Indicators: Discomfort Pain Intervention(s): Monitored during session    Home Living                          Prior Function            PT Goals (current goals can now be found in the care plan section) Progress towards PT goals: Progressing toward goals    Frequency    Min 1X/week      PT Plan      Co-evaluation              AM-PAC PT "6 Clicks" Mobility   Outcome Measure  Help needed turning from your back to your side while in a flat bed without using bedrails?: A Little Help needed moving from lying on your back to sitting on the side of a flat bed without using bedrails?: A Lot Help needed moving to and from a bed to a chair (including a wheelchair)?: A Lot Help needed standing up from a chair using your arms (e.g., wheelchair or bedside chair)?: A Lot Help needed to walk in hospital room?: Total Help needed climbing 3-5 steps with a railing? : Total 6 Click Score: 11    End of Session Equipment Utilized During Treatment: Gait belt Activity Tolerance: Patient limited by fatigue Patient left: in bed;with call bell/phone within reach;with bed alarm set;with family/visitor present Nurse Communication: Mobility status PT Visit Diagnosis: Unsteadiness on feet (R26.81);Muscle weakness (generalized) (M62.81);Difficulty in walking, not elsewhere classified (R26.2)     Time: 6213-0865 PT Time Calculation (min) (ACUTE ONLY): 24 min  Charges:    $Therapeutic Activity: 8-22 mins PT General Charges $$ ACUTE PT VISIT: 1 Visit                     Shela Nevin, PT, DPT Acute Rehab Services [phone]    [name] 06/22/2023, 3:14 PM

## 2023-06-22 NOTE — Progress Notes (Signed)
Occupational Therapy Treatment Patient Details Name: Derek Thompson MRN: [id number] DOB: 1961-02-10 Today's Date: 06/22/2023   History of present illness Pt is a 63 y/o M presenting to [facility] on 2/11 with 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 PMH: poorly controlled DM2, chronic diastolic CHF, chronic R sided heart failure, anemia of chronic disease, L bundle branch block   OT comments  Pt is not making continued progress towards acute OT goals due to new WB status. Pt continues to be limited due to deficits listed below. Pt seen s/p right first and second ray amputation and now has touchdown weight bearing precautions through RLE. WB precautions reviewed with pt, pt verbalized understanding. Pt completed bed mobility task with MIN A with increased time to initiate task. Pt required up to MAX A+2 for functional mobility tasks using RW. Pt unable to maintain WB precautions despite MAX verbal/tactile cues throughout. Pt with impaired recall of precautions and required cueing to recall at the end of therapy session. Pt observed with impaired spatial awareness, coordination of LUE and reported impaired sensation in LUE. RN made aware of apparent neurological symptoms. OT to continue following pt acutely to address functional needs with d/c recommendations of follow up OT services <3hrs/day to maximize functional independence.       If plan is discharge home, recommend the following:  A lot of help with walking and/or transfers;A lot of help with bathing/dressing/bathroom;Assistance with cooking/housework;Direct supervision/assist for medications management;Direct supervision/assist for financial management;Assist for transportation;Help with stairs or ramp for entrance   Equipment Recommendations  Other (comment) (defer)    Recommendations for Other Services PT consult    Precautions /  Restrictions Precautions Precautions: Fall Recall of Precautions/Restrictions: Impaired Restrictions Weight Bearing Restrictions Per Provider Order: Yes RLE Weight Bearing Per Provider Order: Touchdown weight bearing Other Position/Activity Restrictions: wear post op shoe/boot when performing functional mobility       Mobility Bed Mobility Overal bed mobility: Needs Assistance Bed Mobility: Sit to Supine     Supine to sit: Min assist     General bed mobility comments: increased time to initiate task. verbal cues for sequencing. assistance provided with bringing LLE OOB    Transfers Overall transfer level: Needs assistance Equipment used: Rolling walker (2 wheels) Transfers: Sit to/from Stand Sit to Stand: Max assist, +2 physical assistance, From elevated surface           General transfer comment: unable to adhere to RLE WB precautions. Impaired coordination and awareness of LUE.     Balance Overall balance assessment: Needs assistance Sitting-balance support: Bilateral upper extremity supported, Feet supported Sitting balance-Leahy Scale: Fair Sitting balance - Comments: static sitting EOB   Standing balance support: Bilateral upper extremity supported, During functional activity, Reliant on assistive device for balance Standing balance-Leahy Scale: Poor Standing balance comment: Reliant on RW for support. Difficulty maintaining balance when adhering to RLE WB precautions                           ADL either performed or assessed with clinical judgement   ADL Overall ADL's : Needs assistance/impaired                             Toileting- Clothing Manipulation and Hygiene: Total assistance;+2 for physical assistance;Sit to/from stand       Functional  mobility during ADLs: Maximal assistance;+2 for physical assistance;Rolling walker (2 wheels) General ADL Comments: s/p amputation of 1st and 2nd pt has RLE WB restrictions. Pt unable to  maintain WB precautions when performing functional mobility with MAX verbal/tactiel cues. Pt with impaired coordination and spatial awareness of LUE.    Extremity/Trunk Assessment Upper Extremity Assessment Upper Extremity Assessment: Left hand dominant;LUE deficits/detail LUE Deficits / Details: Impaired proprioception, FM, and gross motor coodination LUE Sensation: decreased light touch;decreased proprioception LUE Coordination: decreased fine motor;decreased gross motor   Lower Extremity Assessment Lower Extremity Assessment: Defer to PT evaluation        Vision Baseline Vision/History: 1 Wears glasses Ability to See in Adequate Light: 0 Adequate Patient Visual Report: No change from baseline Vision Assessment?: Wears glasses for reading;Wears glasses for driving   Perception Perception Perception: Impaired Preception Impairment Details: Spatial orientation Perception-Other Comments: LUE   Praxis Praxis Praxis: Not tested   Communication Communication Communication: Impaired Factors Affecting Communication: Reduced clarity of speech   Cognition Arousal: Alert Behavior During Therapy: Flat affect Cognition: Cognition impaired   Orientation impairments: Situation Awareness: Intellectual awareness intact, Online awareness impaired Memory impairment (select all impairments): Short-term memory, Working memory Attention impairment (select first level of impairment): Sustained attention Executive functioning impairment (select all impairments): Initiation, Organization, Sequencing, Problem solving OT - Cognition Comments: Required cues to recall WB precautions. MAX verbal/tactile cues required throughout transfer to adhere functionally                 Following commands: Impaired Following commands impaired: Follows one step commands inconsistently      Cueing   Cueing Techniques: Verbal cues, Tactile cues  Exercises      Shoulder Instructions       General  Comments VSS on RA, family present    Pertinent Vitals/ Pain       Pain Assessment Pain Assessment: Faces Faces Pain Scale: Hurts little more Pain Location: Discomfort with positioning in bed Pain Descriptors / Indicators: Discomfort Pain Intervention(s): Monitored during session, Repositioned  Home Living                                          Prior Functioning/Environment              Frequency  Min 1X/week        Progress Toward Goals  OT Goals(current goals can now be found in the care plan section)  Progress towards OT goals: Progressing toward goals  Acute Rehab OT Goals Patient Stated Goal: none stated OT Goal Formulation: With patient Time For Goal Achievement: 06/28/23 Potential to Achieve Goals: Good ADL Goals Pt Will Perform Upper Body Dressing: with supervision;sitting Pt Will Perform Lower Body Dressing: with min assist;sitting/lateral leans;sit to/from stand Pt Will Transfer to Toilet: with min assist;ambulating;regular height toilet Pt Will Perform Tub/Shower Transfer: Tub transfer;Shower transfer;with supervision;ambulating;shower seat  Plan      Co-evaluation                 AM-PAC OT "6 Clicks" Daily Activity     Outcome Measure   Help from another person eating meals?: A Little Help from another person taking care of personal grooming?: A Little Help from another person toileting, which includes using toliet, bedpan, or urinal?: A Lot Help from another person bathing (including washing, rinsing, drying)?: A Lot Help from another person to put on and  taking off regular upper body clothing?: A Little Help from another person to put on and taking off regular lower body clothing?: A Lot 6 Click Score: 15    End of Session Equipment Utilized During Treatment: Gait belt;Rolling walker (2 wheels)  OT Visit Diagnosis: Unsteadiness on feet (R26.81);Other abnormalities of gait and mobility (R26.89);Muscle weakness  (generalized) (M62.81)   Activity Tolerance Patient tolerated treatment well   Patient Left in chair;with call bell/phone within reach;with family/visitor present   Nurse Communication Mobility status        Time: 4098-1191 OT Time Calculation (min): 29 min  Charges: OT General Charges $OT Visit: 1 Visit OT Treatments $Self Care/Home Management : 8-22 mins $Therapeutic Activity: 8-22 mins  [address] [place]  Derek Thompson 06/22/2023, 1:29 PM

## 2023-06-22 NOTE — Progress Notes (Signed)
PROGRESS NOTE        PATIENT DETAILS Name: Derek Thompson Age: 63 y.o. Sex: male Date of Birth: 1960-05-27 Admit Date: 06/13/2023 Admitting Physician Angie Fava, DO EAV:[id number], Casimiro Needle, DO  Brief Summary: Patient is a 63 y.o.  male with history of chronic HFrEF, DM-2, HTN, prostate cancer-s/p radiation 2022-who presented with generalized weakness/acute metabolic encephalopathy-he was found to have HHS secondary to right diabetic foot (great/second toe) with gangrene/osteomyelitis, LV thrombus and acute CVA.  He was stabilized in the ICU-and subsequently transferred to [facility] on 2/16.  Significant events: 2/12>> admit to [facility]-HHS/encephalopathy/diabetic foot-developed hypotension-started on Levophed-transfer to ICU. 2/14>> RLE angiogram-small vessel disease. 2/16>> transferred to [facility].  Significant studies: 2/11>> A1c: 13.3 2/12>> MRI brain: Acute infarct right frontal lobe, additional small infarct right cerebellum, left frontal/parietal/occipital lobe. 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 2/12>> renal ultrasound: No hydronephrosis 2/12>> echo: EF 20-25%, LV thrombus.  Grade 2 diastolic dysfunction.  Moderate mitral valve regurgitation 2/13>> LDL: 49.  Significant microbiology data: 2/11>> COVID/influenza/RSV PCR: Negative 2/12>> blood culture: Negative. 2/19>> intraoperative cultures/right foot: Rare gram-positive cocci in pairs.  Procedures: 2/14>> RLE angiogram by vascular surgery-small vessel disease. 2/19>> right first and second ray amputation.  Consults: PCCM Orthopedics Cardiology Neurology  Subjective: Lying comfortably in bed-no major issues.  Objective: Vitals: Blood pressure 121/62, pulse 76, temperature 98.2 F (36.8 C), temperature source Oral, resp. rate 19, height 6' (1.829 m), weight 88.6 kg, SpO2 95%.   Exam: Gen Exam:Alert awake-not in any  distress HEENT:atraumatic, normocephalic Chest: B/L clear to auscultation anteriorly CVS:S1S2 regular Abdomen:soft non tender, non distended Extremities:no edema-right foot in bandage did not open. Neurology: Non focal Skin: no rash  Pertinent Labs/Radiology:    Latest Ref Rng & Units 06/22/2023    5:20 AM 06/21/2023    4:28 AM 06/20/2023    3:55 AM  CBC  WBC 4.0 - 10.5 K/uL 10.6  6.7  7.4   Hemoglobin 13.0 - 17.0 g/dL 19.1  47.8  9.6   Hematocrit 39.0 - 52.0 % 32.7  31.3  30.5   Platelets 150 - 400 K/uL 151  129  124     Lab Results  Component Value Date   NA 135 06/22/2023   K 4.5 06/22/2023   CL 112 (H) 06/22/2023   CO2 17 (L) 06/22/2023    Assessment/Plan: Septic shock secondary to right diabetic foot with wet gangrene/osteomyelitis involving great/second toe-s/p first and second ray amputation on 2/19 Sepsis physiology has resolved Intraoperative cultures pending Will change antibiotics to Zyvox/Unasyn-and narrow further based on culture data. Follow final cultures-Dr. Lajoyce Corners recommending antibiotics x 3 weeks.  Acute metabolic encephalopathy Suspect this is mostly from sepsis-with some component from CVA Mentation has improved with treatment of underlying issues.  Acute embolic CVA Likely secondary to LV thrombus in the setting of severely reduced LVEF Switch IV heparin to Eliquis today-has no further surgical procedures planned. Nonfocal exam  Chronic HFrEF Volume status stable Continue beta-blocker/furosemide/digoxin Cardiology starting low-dose Entresto/Aldactone today. Follow closely.  LV thrombus Was on IV heparin-will switch to Eliquis today.  HHS Resolved-treated with IV fluids/IV insulin  AKI Likely hemodynamically mediated Creatinine levels have plateaued-watch closely as patient being started on ARNI/Aldactone.  DM-2 with poor control/uncontrolled hyperglycemia (A1c 13.3 on 2/11) CBGs stable Semglee 15 units+ SSI Follow/optimize.  Recent  Labs    06/21/23 1646 06/21/23 2331 06/22/23 0403  GLUCAP 296* 111* 75    HTN BP relatively stable Continue metoprolol/diuretics/ARNI. Follow/optimize  Normocytic anemia Secondary to critical illness No evidence of blood loss S/p 1 unit of PRBC on 2/17-Hb currently stable.  Thrombocytopenia Likely due to sepsis physiology Slowly improving Follow.  Sick euthyroid syndrome TSH/free T4 mildly elevated Repeat thyroid function test in 4 to 6 weeks when he has recovered from acute illness.  BMI: Estimated body mass index is 26.49 kg/m as calculated from the following:   Height as of this encounter: 6' (1.829 m).   Weight as of this encounter: 88.6 kg.   Code status:   Code Status: Full Code   DVT Prophylaxis: SCD's Start: 06/21/23 1157 SCDs Start: 06/13/23 2205   Family Communication: Mother and 2 sons on 2/20   Disposition Plan: Status is: Inpatient Remains inpatient appropriate because: Severity of illness   Planned Discharge Destination:Skilled nursing facility   Diet: Diet Order             Diet Carb Modified Fluid consistency: Thin; Room service appropriate? No  Diet effective now                     Antimicrobial agents: Anti-infectives (From admission, onward)    Start     Dose/Rate Route Frequency Ordered Stop   06/21/23 1400  ceFAZolin (ANCEF) IVPB 1 g/50 mL premix  Status:  Discontinued        1 g 100 mL/hr over 30 Minutes Intravenous Every 8 hours 06/21/23 1156 06/21/23 1242   06/21/23 1048  vancomycin (VANCOCIN) powder  Status:  Discontinued          As needed 06/21/23 1048 06/21/23 1113   06/21/23 1000  ceFAZolin (ANCEF) IVPB 2g/100 mL premix        2 g 200 mL/hr over 30 Minutes Intravenous On call to O.R. 06/21/23 0602 06/21/23 1021   06/19/23 2200  vancomycin (VANCOREADY) IVPB 750 mg/150 mL        750 mg 150 mL/hr over 60 Minutes Intravenous Every 24 hours 06/19/23 1008     06/15/23 2200  vancomycin (VANCOREADY) IVPB 1500 mg/300  mL  Status:  Discontinued        1,500 mg 150 mL/hr over 120 Minutes Intravenous Every 24 hours 06/15/23 1417 06/19/23 1008   06/15/23 1845  ceFEPIme (MAXIPIME) 2 g in sodium chloride 0.9 % 100 mL IVPB        2 g 200 mL/hr over 30 Minutes Intravenous Every 8 hours 06/15/23 1445     06/14/23 1543  vancomycin variable dose per unstable renal function (pharmacist dosing)  Status:  Discontinued         Does not apply See admin instructions 06/14/23 1543 06/16/23 0709   06/14/23 1045  ceFEPIme (MAXIPIME) 2 g in sodium chloride 0.9 % 100 mL IVPB  Status:  Discontinued        2 g 200 mL/hr over 30 Minutes Intravenous Every 8 hours 06/14/23 0955 06/14/23 0956   06/14/23 1045  metroNIDAZOLE (FLAGYL) tablet 500 mg  Status:  Discontinued        500 mg Oral Every 12 hours 06/14/23 0955 06/21/23 1242   06/14/23 1045  ceFEPIme (MAXIPIME) 2 g in sodium chloride 0.9 % 100 mL IVPB  Status:  Discontinued        2 g 200 mL/hr over 30 Minutes Intravenous Every 12 hours 06/14/23 0956 06/15/23 1445  06/14/23 1000  vancomycin (VANCOREADY) IVPB 1250 mg/250 mL  Status:  Discontinued        1,250 mg 166.7 mL/hr over 90 Minutes Intravenous Every 24 hours 06/14/23 0638 06/14/23 1545   06/14/23 0700  vancomycin (VANCOREADY) IVPB 1500 mg/300 mL        1,500 mg 150 mL/hr over 120 Minutes Intravenous  Once 06/14/23 0611 06/14/23 1938   06/14/23 0130  azithromycin (ZITHROMAX) 500 mg in sodium chloride 0.9 % 250 mL IVPB  Status:  Discontinued        500 mg 250 mL/hr over 60 Minutes Intravenous Daily at bedtime 06/14/23 0100 06/14/23 0611   06/14/23 0130  cefTRIAXone (ROCEPHIN) 1 g in sodium chloride 0.9 % 100 mL IVPB  Status:  Discontinued        1 g 200 mL/hr over 30 Minutes Intravenous Daily at bedtime 06/14/23 0100 06/14/23 8295        MEDICATIONS: Scheduled Meds:  vitamin C  1,000 mg Oral Daily   atorvastatin  80 mg Oral Daily   Chlorhexidine Gluconate Cloth  6 each Topical Daily   digoxin  0.125 mg Oral  Daily   feeding supplement  237 mL Oral BID BM   furosemide  20 mg Oral Daily   insulin aspart  0-15 Units Subcutaneous TID WC   insulin aspart  0-5 Units Subcutaneous QHS   insulin glargine-yfgn  15 Units Subcutaneous Daily   insulin starter kit- pen needles  1 kit Other Once   living well with diabetes book   Does not apply Once   metoprolol tartrate  25 mg Oral BID   multivitamin with minerals  1 tablet Oral Daily   nutrition supplement (JUVEN)  1 packet Oral BID BM   pantoprazole  40 mg Oral Daily   sacubitril-valsartan  1 tablet Oral BID   sodium bicarbonate  650 mg Oral TID   sodium chloride flush  10-40 mL Intracatheter Q12H   spironolactone  12.5 mg Oral Q2200   tamsulosin  0.4 mg Oral Daily   zinc sulfate (50mg  elemental zinc)  220 mg Oral Daily   Continuous Infusions:  sodium chloride Stopped (06/21/23 1531)   ceFEPime (MAXIPIME) IV 2 g (06/22/23 0904)   heparin 1,750 Units/hr (06/22/23 0858)   vancomycin Stopped (06/21/23 2322)   PRN Meds:.acetaminophen **OR** acetaminophen, ondansetron (ZOFRAN) IV, mouth rinse, sodium chloride flush   I have personally reviewed following labs and imaging studies  LABORATORY DATA: CBC: Recent Labs  Lab 06/18/23 0321 06/19/23 0351 06/19/23 2206 06/20/23 0355 06/21/23 0428 06/22/23 0520  WBC 12.0* 8.0  --  7.4 6.7 10.6*  HGB 7.8* 7.3* 9.4* 9.6* 10.0* 10.5*  HCT 24.1* 23.4* 29.2* 30.5* 31.3* 32.7*  MCV 86.1 87.0  --  84.5 83.2 83.8  PLT 114* 110*  --  124* 129* 151    Basic Metabolic Panel: Recent Labs  Lab 06/16/23 0404 06/16/23 2031 06/17/23 0654 06/18/23 0321 06/19/23 0351 06/20/23 0355 06/21/23 0428 06/22/23 0520  NA 131*  --  131* 133* 133* 135 133* 135  K 4.5  --  4.5 3.9 4.4 4.2 4.0 4.5  CL 104  --  107 108 110 110 109 112*  CO2 19*  --  18* 15* 17* 16* 18* 17*  GLUCOSE 176*  --  149* 91 241* 154* 160* 81  BUN 16  --  20 20 23 20 20 18   CREATININE 1.26*  --  1.36* 1.22 1.28* 1.24 1.44* 1.42*  CALCIUM  7.6*  --  7.7* 7.9* 7.6* 7.8* 7.9* 8.1*  MG 1.7  --  2.0  --  1.8 1.7 1.9  --   PHOS 1.6* 3.5 2.8  --  2.1* 2.1* 2.7  --     GFR: Estimated Creatinine Clearance: 58.4 mL/min (A) (by C-G formula based on SCr of 1.42 mg/dL (H)).  Liver Function Tests: Recent Labs  Lab 06/19/23 0351 06/20/23 0355  AST 12*  --   ALT 13  --   ALKPHOS 37*  --   BILITOT 0.6  --   PROT 5.6*  --   ALBUMIN <1.5* 1.6*   No results for input(s): "LIPASE", "AMYLASE" in the last 168 hours.  No results for input(s): "AMMONIA" in the last 168 hours.   Coagulation Profile: No results for input(s): "INR", "PROTIME" in the last 168 hours.   Cardiac Enzymes: No results for input(s): "CKTOTAL", "CKMB", "CKMBINDEX", "TROPONINI" in the last 168 hours.  BNP (last 3 results) No results for input(s): "PROBNP" in the last 8760 hours.  Lipid Profile: No results for input(s): "CHOL", "HDL", "LDLCALC", "TRIG", "CHOLHDL", "LDLDIRECT" in the last 72 hours.  Thyroid Function Tests: No results for input(s): "TSH", "T4TOTAL", "FREET4", "T3FREE", "THYROIDAB" in the last 72 hours.  Anemia Panel: No results for input(s): "VITAMINB12", "FOLATE", "FERRITIN", "TIBC", "IRON", "RETICCTPCT" in the last 72 hours.  Urine analysis:    Component Value Date/Time   COLORURINE YELLOW 05/03/2020 1303   APPEARANCEUR CLEAR 05/03/2020 1303   LABSPEC 1.022 05/03/2020 1303   PHURINE 6.0 05/03/2020 1303   GLUCOSEU >=500 (A) 05/03/2020 1303   HGBUR NEGATIVE 05/03/2020 1303   BILIRUBINUR NEGATIVE 05/03/2020 1303   KETONESUR 80 (A) 05/03/2020 1303   PROTEINUR NEGATIVE 05/03/2020 1303   NITRITE NEGATIVE 05/03/2020 1303   LEUKOCYTESUR NEGATIVE 05/03/2020 1303    Sepsis Labs: Lactic Acid, Venous    Component Value Date/Time   LATICACIDVEN 8.7 (HH) 06/14/2023 0452    MICROBIOLOGY: Recent Results (from the past 240 hours)  Resp panel by RT-PCR (RSV, Flu A&B, Covid) Anterior Nasal Swab     Status: None   Collection Time: 06/13/23   8:14 PM   Specimen: Anterior Nasal Swab  Result Value Ref Range Status   SARS Coronavirus 2 by RT PCR NEGATIVE NEGATIVE Final   Influenza A by PCR NEGATIVE NEGATIVE Final   Influenza B by PCR NEGATIVE NEGATIVE Final    Comment: (NOTE) The Xpert Xpress SARS-CoV-2/FLU/RSV plus assay is intended as an aid in the diagnosis of influenza from Nasopharyngeal swab specimens and should not be used as a sole basis for treatment. Nasal washings and aspirates are unacceptable for Xpert Xpress SARS-CoV-2/FLU/RSV testing.  Fact Sheet for Patients: BloggerCourse.com  Fact Sheet for Healthcare Providers: SeriousBroker.it  This test is not yet approved or cleared by the [place] FDA and has been authorized for detection and/or diagnosis of SARS-CoV-2 by FDA under an Emergency Use Authorization (EUA). This EUA will remain in effect (meaning this test can be used) for the duration of the COVID-19 declaration under Section 564(b)(1) of the Act, 21 U.S.C. section 360bbb-3(b)(1), unless the authorization is terminated or revoked.     Resp Syncytial Virus by PCR NEGATIVE NEGATIVE Final    Comment: (NOTE) Fact Sheet for Patients: BloggerCourse.com  Fact Sheet for Healthcare Providers: SeriousBroker.it  This test is not yet approved or cleared by the [place] FDA and has been authorized for detection and/or diagnosis of SARS-CoV-2 by FDA under an Emergency Use Authorization (EUA). This EUA will remain in effect (meaning  this test can be used) for the duration of the COVID-19 declaration under Section 564(b)(1) of the Act, 21 U.S.C. section 360bbb-3(b)(1), unless the authorization is terminated or revoked.  Performed at [facility], [address], [place], [name] [address]   Culture, blood (Routine X 2) w Reflex to ID Panel     Status: None   Collection Time: 06/14/23  4:52 AM    Specimen: BLOOD  Result Value Ref Range Status   Specimen Description BLOOD RIGHT ANTECUBITAL  Final   Special Requests   Final    BOTTLES DRAWN AEROBIC AND ANAEROBIC Blood Culture results may not be optimal due to an inadequate volume of blood received in culture bottles   Culture   Final    NO GROWTH 5 DAYS Performed at [facility], [address] [place], [name] [address]    Report Status 06/19/2023 FINAL  Final  Culture, blood (Routine X 2) w Reflex to ID Panel     Status: None   Collection Time: 06/14/23  5:00 AM   Specimen: BLOOD  Result Value Ref Range Status   Specimen Description BLOOD RIGHT ANTECUBITAL  Final   Special Requests   Final    BOTTLES DRAWN AEROBIC AND ANAEROBIC Blood Culture results may not be optimal due to an inadequate volume of blood received in culture bottles   Culture   Final    NO GROWTH 5 DAYS Performed at [facility], [address] [place], [name] [address]    Report Status 06/19/2023 FINAL  Final  MRSA Next Gen by PCR, Nasal     Status: None   Collection Time: 06/14/23  5:15 AM   Specimen: Nasal Mucosa; Nasal Swab  Result Value Ref Range Status   MRSA by PCR Next Gen NOT DETECTED NOT DETECTED Final    Comment: (NOTE) The GeneXpert MRSA Assay (FDA approved for NASAL specimens only), is one component of a comprehensive MRSA colonization surveillance program. It is not intended to diagnose MRSA infection nor to guide or monitor treatment for MRSA infections. Test performance is not FDA approved in patients less than 29 years old. Performed at [facility], [address] [place], [name] [address]   Aerobic/Anaerobic Culture w Gram Stain (surgical/deep wound)     Status: None (Preliminary result)   Collection Time: 06/21/23 10:32 AM   Specimen: Soft Tissue, Other  Result Value Ref Range Status   Specimen Description TISSUE  Final   Special Requests SOFT TISSUE  Final   Gram Stain   Final    RARE WBC PRESENT,  PREDOMINANTLY PMN RARE GRAM POSITIVE COCCI IN PAIRS    Culture   Final    CULTURE REINCUBATED FOR BETTER GROWTH Performed at [facility], [address], [place], [name] [address]    Report Status PENDING  Incomplete    RADIOLOGY STUDIES/RESULTS: No results found.   LOS: 9 days   Jeoffrey Massed, MD  [facility]    To contact the attending provider between 7A-7P or the covering provider during after hours 7P-7A, please log into the web site [email or web address] and access using universal [place] password for that web site. If you do not have the password, please call the hospital operator.  06/22/2023, 11:44 AM

## 2023-06-22 NOTE — Plan of Care (Signed)

## 2023-06-22 NOTE — Plan of Care (Signed)
   Problem: Education: Goal: Knowledge of General Education information will improve Description: Including pain rating scale, medication(s)/side effects and non-pharmacologic comfort measures Outcome: Not Progressing   Problem: Health Behavior/Discharge Planning: Goal: Ability to manage health-related needs will improve Outcome: Not Progressing

## 2023-06-22 NOTE — Progress Notes (Signed)
Speech Language Pathology Treatment: Cognitive-Linquistic  Patient Details Name: Derek Thompson MRN: [id number] DOB: 1960/11/15 Today's Date: 06/22/2023 Time: [id number] SLP Time Calculation (min) (ACUTE ONLY): 19 min  Assessment / Plan / Recommendation Clinical Impression  Pt seen for skilled SLP intervention to address cognitive goals. He is sitting upright in chair with multiple family members at bedside. We discussed family concerns and pt's cognitive needs. Family reported pt needs help with memory strategies. Pt demonstrated limited - no awareness of deficits. He required min cues throughout session to maintain attention to tasks and benefited from redirection in noisy environment (family present, TV on). He was able to complete immediate and delayed recall of unfamiliar name/address without cues. SLP reviewed compensatory memory strategies including visual reminders, keeping a calendar, and phone reminders/alarms. Understanding verbalized by pt and family.   Continue SLP PoC.    HPI HPI: Pt is a 63 y/o M presenting to Ed on 2/11 with 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 PMH: poorly controlled DM2, chronic diastolic CHF, chronic R sided heart failure, anemia of chronic disease, L bundle branch block. MRI: Acute or early subacute confluent infarct in the right frontal lobe.  Additional smaller acute infarcts in the right cerebellum, left  frontal, parietal and occipital lobes. Given involvement of multiple  vascular territories, consider an embolic etiology.      SLP Plan  Continue with current plan of care      Recommendations for follow up therapy are one component of a multi-disciplinary discharge planning process, led by the attending physician.  Recommendations may be updated based on patient status, additional functional criteria and insurance authorization.    Recommendations                          Intermittent Supervision/Assistance Cognitive communication deficit (Y78.295)     Continue with current plan of care     Ellery Plunk  06/22/2023, 3:39 PM

## 2023-06-22 NOTE — Progress Notes (Addendum)
PHARMACY - ANTICOAGULATION + ANTIBIOTIC CONSULT NOTE  Pharmacy Consult for Heparin > to Eliquis;  Cefepime to Unasyn Indication:  LV thrombus + thromboembolic vs ischemic stroke ;   No Known Allergies  Patient Measurements: Height: 6' (182.9 cm) Weight: 88.6 kg (195 lb 5.2 oz) IBW/kg (Calculated) : 77.6 Heparin Dosing Weight: 85.7 kg  Vital Signs: Temp: 98.2 F (36.8 C) (02/20 0400) Temp Source: Oral (02/20 0400)  Labs: Recent Labs    06/20/23 0355 06/20/23 1810 06/21/23 0428 06/22/23 0520  HGB 9.6*  --  10.0* 10.5*  HCT 30.5*  --  31.3* 32.7*  PLT 124*  --  129* 151  HEPARINUNFRC 0.62 0.48 0.45 0.26*  CREATININE 1.24  --  1.44* 1.42*    Estimated Creatinine Clearance: 58.4 mL/min (A) (by C-G formula based on SCr of 1.42 mg/dL (H)).  Assessment: 63 y.o M w/ PMHx of poorly controlled T2DM, HFrEF (LVEF 25%), LBBB, G2DD and LV global hypokinesis, HTN, CAD, Prostate Cancer treated w/ radiation 2022 who presented to ED for 1 week of progressive generalized weakness, 2 days AMS, admitted for suspected HHS. 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 Note patient also with acute/subacute infarct in R frontal lobe, with suspicion for embolic etiology.   Heparin level is just below therapeutic (0.26) on 1700 units/hr. Had been at goal x 2 on same rate, and a little high on 1800 units/hr.  Targeting low-therapeutic heparin levels.  Hgb improved after 2 units PRBCs on 2/17; platelet count low stable. No infusion problems or bleeding reported.  Addendum: 12:20pm: To transition to Eliquis. Not anticipating further procedures. - also changing Vancomycin and Cefepime to PO Zyvox and Unasyn, pending final cultures.  Goal of Therapy:  Heparin level 0.3-0.5 units/ml (targeting low end for concomitant CVA w/ petechial hemorrhage) Monitor platelets by anticoagulation protocol: Yes Appropriate Unasyn regimen for indication and renal  function   Plan:  Increase heparin drip to 1750 units/hr Heparin level ~8 hours after rate change Daily heparin level and CBC while on heparin. Follow up for transition to oral anticoagulation (expect Eliquis) post-op when able.  Addendum:  Transition to Eliquis 5 mg BID.  IV heparin to stop when giving first dose of Eliquis. Unasyn 3gm IV q6hrs Will follow renal function for any need to adjust regimen; follow up final cultures.  Dennie Fetters, RPh 06/22/2023,8:01 AM Addendum: 12:21 PM

## 2023-06-23 ENCOUNTER — Ambulatory Visit: Payer: BC Managed Care – PPO | Admitting: Family

## 2023-06-23 ENCOUNTER — Inpatient Hospital Stay (HOSPITAL_COMMUNITY): Payer: BC Managed Care – PPO

## 2023-06-23 DIAGNOSIS — I5022 Chronic systolic (congestive) heart failure: Secondary | ICD-10-CM | POA: Diagnosis not present

## 2023-06-23 DIAGNOSIS — I4729 Other ventricular tachycardia: Secondary | ICD-10-CM | POA: Diagnosis not present

## 2023-06-23 DIAGNOSIS — I240 Acute coronary thrombosis not resulting in myocardial infarction: Secondary | ICD-10-CM | POA: Diagnosis not present

## 2023-06-23 DIAGNOSIS — A419 Sepsis, unspecified organism: Secondary | ICD-10-CM | POA: Diagnosis not present

## 2023-06-23 LAB — BRAIN NATRIURETIC PEPTIDE: B Natriuretic Peptide: 4255.9 pg/mL — ABNORMAL HIGH (ref 0.0–100.0)

## 2023-06-23 LAB — GLUCOSE, CAPILLARY
Glucose-Capillary: 111 mg/dL — ABNORMAL HIGH (ref 70–99)
Glucose-Capillary: 142 mg/dL — ABNORMAL HIGH (ref 70–99)
Glucose-Capillary: 146 mg/dL — ABNORMAL HIGH (ref 70–99)
Glucose-Capillary: 149 mg/dL — ABNORMAL HIGH (ref 70–99)
Glucose-Capillary: 151 mg/dL — ABNORMAL HIGH (ref 70–99)
Glucose-Capillary: 157 mg/dL — ABNORMAL HIGH (ref 70–99)
Glucose-Capillary: 217 mg/dL — ABNORMAL HIGH (ref 70–99)

## 2023-06-23 LAB — BASIC METABOLIC PANEL
Anion gap: 7 (ref 5–15)
BUN: 19 mg/dL (ref 8–23)
CO2: 16 mmol/L — ABNORMAL LOW (ref 22–32)
Calcium: 7.8 mg/dL — ABNORMAL LOW (ref 8.9–10.3)
Chloride: 112 mmol/L — ABNORMAL HIGH (ref 98–111)
Creatinine, Ser: 1.48 mg/dL — ABNORMAL HIGH (ref 0.61–1.24)
GFR, Estimated: 53 mL/min — ABNORMAL LOW (ref >60)
Glucose, Bld: 118 mg/dL — ABNORMAL HIGH (ref 70–99)
Potassium: 3.9 mmol/L (ref 3.5–5.1)
Sodium: 135 mmol/L (ref 135–145)

## 2023-06-23 LAB — CBC
HCT: 28.6 % — ABNORMAL LOW (ref 39.0–52.0)
Hemoglobin: 9.3 g/dL — ABNORMAL LOW (ref 13.0–17.0)
MCH: 27.2 pg (ref 26.0–34.0)
MCHC: 32.5 g/dL (ref 30.0–36.0)
MCV: 83.6 fL (ref 80.0–100.0)
Platelets: 145 10*3/uL — ABNORMAL LOW (ref 150–400)
RBC: 3.42 MIL/uL — ABNORMAL LOW (ref 4.22–5.81)
RDW: 19.9 % — ABNORMAL HIGH (ref 11.5–15.5)
WBC: 14.1 10*3/uL — ABNORMAL HIGH (ref 4.0–10.5)
nRBC: 0 % (ref 0.0–0.2)

## 2023-06-23 LAB — MAGNESIUM: Magnesium: 1.5 mg/dL — ABNORMAL LOW (ref 1.7–2.4)

## 2023-06-23 MED ORDER — MAGNESIUM SULFATE 4 GM/100ML IV SOLN
4.0000 g | Freq: Once | INTRAVENOUS | Status: AC
  Administered 2023-06-23: 4 g via INTRAVENOUS
  Filled 2023-06-23: qty 100

## 2023-06-23 MED ORDER — FUROSEMIDE 10 MG/ML IJ SOLN
40.0000 mg | Freq: Once | INTRAMUSCULAR | Status: DC
Start: 2023-06-23 — End: 2023-06-23

## 2023-06-23 MED ORDER — FUROSEMIDE 10 MG/ML IJ SOLN
40.0000 mg | Freq: Once | INTRAMUSCULAR | Status: AC
  Administered 2023-06-23: 40 mg via INTRAVENOUS
  Filled 2023-06-23: qty 4

## 2023-06-23 MED ORDER — GUAIFENESIN 100 MG/5ML PO LIQD
5.0000 mL | ORAL | Status: DC | PRN
  Administered 2023-06-23: 5 mL via ORAL
  Filled 2023-06-23: qty 5

## 2023-06-23 NOTE — Care Management Note (Signed)
Late Entry: [phone]- Patient noted to be coughing, also vomited. Given prn zofran, however, vomiting seemed more related to strong cough. Patient would not answer questions. Heart rate also increast ST 130's, physician notified. EKG also obtained.  0457:Oxygen saturation decreased to 90% on RA, patient placed on 2L [place]. 1000 am dose of metoprolol given early per MD order. 8295: Patient had an 11 beat run of V-tach, physician notified.

## 2023-06-23 NOTE — Progress Notes (Signed)
PROGRESS NOTE        PATIENT DETAILS Name: Derek Thompson Age: 63 y.o. Sex: male Date of Birth: 04/04/1961 Admit Date: 06/13/2023 Admitting Physician Angie Fava, DO UJW:[id number], Casimiro Needle, DO  Brief Summary: Patient is a 63 y.o.  male with history of chronic HFrEF, DM-2, HTN, prostate cancer-s/p radiation 2022-who presented with generalized weakness/acute metabolic encephalopathy-he was found to have HHS secondary to right diabetic foot (great/second toe) with gangrene/osteomyelitis, LV thrombus and acute CVA.  He was stabilized in the ICU-and subsequently transferred to [facility] on 2/16.  Significant events: 2/12>> admit to [facility]-HHS/encephalopathy/diabetic foot-developed hypotension-started on Levophed-transfer to ICU. 2/14>> RLE angiogram-small vessel disease. 2/16>> transferred to [facility].  Significant studies: 2/11>> A1c: 13.3 2/12>> MRI brain: Acute infarct right frontal lobe, additional small infarct right cerebellum, left frontal/parietal/occipital lobe. 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 2/12>> renal ultrasound: No hydronephrosis 2/12>> echo: EF 20-25%, LV thrombus.  Grade 2 diastolic dysfunction.  Moderate mitral valve regurgitation 2/13>> LDL: 49.  Significant microbiology data: 2/11>> COVID/influenza/RSV PCR: Negative 2/12>> blood culture: Negative. 2/19>> intraoperative cultures/right foot: Rare Staph aureus  Procedures: 2/14>> RLE angiogram by vascular surgery-small vessel disease. 2/19>> right first and second ray amputation.  Consults: PCCM Orthopedics Cardiology Neurology  Subjective: Slightly more confused-more short of breath.  Not in any distress  Objective: Vitals: Blood pressure (!) 96/52, pulse 95, temperature 98.3 F (36.8 C), temperature source Oral, resp. rate (!) 22, height 6' (1.829 m), weight 84.7 kg, SpO2 93%.   Exam: Gen Exam:Alert awake-not  in any distress HEENT:atraumatic, normocephalic Chest: Few bibasilar rales. CVS:S1S2 regular Abdomen:soft non tender, non distended Extremities:++ Edema-right foot dressing in place-not soaked. Neurology: Non focal Skin: no rash  Pertinent Labs/Radiology:    Latest Ref Rng & Units 06/23/2023    7:42 AM 06/22/2023    5:20 AM 06/21/2023    4:28 AM  CBC  WBC 4.0 - 10.5 K/uL 14.1  10.6  6.7   Hemoglobin 13.0 - 17.0 g/dL 9.3  78.2  95.6   Hematocrit 39.0 - 52.0 % 28.6  32.7  31.3   Platelets 150 - 400 K/uL 145  151  129     Lab Results  Component Value Date   NA 135 06/23/2023   K 3.9 06/23/2023   CL 112 (H) 06/23/2023   CO2 16 (L) 06/23/2023    Assessment/Plan: Septic shock secondary to right diabetic foot with wet gangrene/osteomyelitis involving great/second toe-s/p first and second ray amputation on 2/19 Sepsis physiology has improved-however slight worsening leukocytosis today Remains on Zyvox-Unasyn Intraoperative findings-preliminary Staph aureus Dr.Duda-recommending antibiotics x 3 weeks based on culture/sensitivity results.  Acute metabolic encephalopathy Suspect this is mostly from sepsis-with some component from CVA Slightly more confused today-unclear delirium Continue supportive care  Maintain delirium precautions.  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 Nonfocal exam  Acute on chronic HFrEF Slightly more short of breath today-has worsening bilateral lower extremity edema-blood pressure soft Discussed with cardiology-IV Lasix today Hold Entresto/Aldactone Continue beta-blocker Follow closely.  LV thrombus Was on IV heparin-has been switched to Eliquis.  HHS Resolved-treated with IV fluids/IV insulin  AKI Likely hemodynamically mediated Creatinine levels have plateaued-watch closely as patient being started on ARNI/Aldactone.  DM-2 with poor  control/uncontrolled hyperglycemia (A1c 13.3 on 2/11) CBGs stable Semglee 15 units+ SSI Follow/optimize.  Recent Labs    06/22/23 2348 06/23/23 0336 06/23/23 0759  GLUCAP 145* 151* 111*    HTN BP soft Holding ARNI/Aldactone-continue low-dose metoprolol with holding parameters.  Normocytic anemia Secondary to critical illness No evidence of blood loss S/p 1 unit of PRBC on 2/17-Hb currently stable.  Thrombocytopenia Likely due to sepsis physiology Slowly improving Follow.  Sick euthyroid syndrome TSH/free T4 mildly elevated Repeat thyroid function test in 4 to 6 weeks when he has recovered from acute illness.  BMI: Estimated body mass index is 25.33 kg/m as calculated from the following:   Height as of this encounter: 6' (1.829 m).   Weight as of this encounter: 84.7 kg.   Code status:   Code Status: Full Code   DVT Prophylaxis: SCD's Start: 06/21/23 1157 SCDs Start: 06/13/23 2205 apixaban (ELIQUIS) tablet 5 mg    Family Communication: Mother and 2 sons on 2/20   Disposition Plan: Status is: Inpatient Remains inpatient appropriate because: Severity of illness   Planned Discharge Destination:Skilled nursing facility   Diet: Diet Order             Diet Carb Modified Fluid consistency: Thin; Room service appropriate? No  Diet effective now                     Antimicrobial agents: Anti-infectives (From admission, onward)    Start     Dose/Rate Route Frequency Ordered Stop   06/22/23 1400  Ampicillin-Sulbactam (UNASYN) 3 g in sodium chloride 0.9 % 100 mL IVPB        3 g 200 mL/hr over 30 Minutes Intravenous Every 6 hours 06/22/23 1154     06/22/23 1245  linezolid (ZYVOX) tablet 600 mg        600 mg Oral Every 12 hours 06/22/23 1147     06/21/23 1400  ceFAZolin (ANCEF) IVPB 1 g/50 mL premix  Status:  Discontinued        1 g 100 mL/hr over 30 Minutes Intravenous Every 8 hours 06/21/23 1156 06/21/23 1242   06/21/23 1048  vancomycin (VANCOCIN)  powder  Status:  Discontinued          As needed 06/21/23 1048 06/21/23 1113   06/21/23 1000  ceFAZolin (ANCEF) IVPB 2g/100 mL premix        2 g 200 mL/hr over 30 Minutes Intravenous On call to O.R. 06/21/23 0602 06/21/23 1021   06/19/23 2200  vancomycin (VANCOREADY) IVPB 750 mg/150 mL  Status:  Discontinued        750 mg 150 mL/hr over 60 Minutes Intravenous Every 24 hours 06/19/23 1008 06/22/23 1147   06/15/23 2200  vancomycin (VANCOREADY) IVPB 1500 mg/300 mL  Status:  Discontinued        1,500 mg 150 mL/hr over 120 Minutes Intravenous Every 24 hours 06/15/23 1417 06/19/23 1008   06/15/23 1845  ceFEPIme (MAXIPIME) 2 g in sodium chloride 0.9 % 100 mL IVPB  Status:  Discontinued        2 g 200 mL/hr over 30 Minutes Intravenous Every 8 hours 06/15/23 1445 06/22/23 1154   06/14/23 1543  vancomycin variable dose per unstable renal function (pharmacist dosing)  Status:  Discontinued         Does not apply See admin instructions 06/14/23 1543 06/16/23 0709   06/14/23 1045  ceFEPIme (MAXIPIME) 2 g in sodium chloride 0.9 % 100 mL IVPB  Status:  Discontinued  2 g 200 mL/hr over 30 Minutes Intravenous Every 8 hours 06/14/23 0955 06/14/23 0956   06/14/23 1045  metroNIDAZOLE (FLAGYL) tablet 500 mg  Status:  Discontinued        500 mg Oral Every 12 hours 06/14/23 0955 06/21/23 1242   06/14/23 1045  ceFEPIme (MAXIPIME) 2 g in sodium chloride 0.9 % 100 mL IVPB  Status:  Discontinued        2 g 200 mL/hr over 30 Minutes Intravenous Every 12 hours 06/14/23 0956 06/15/23 1445   06/14/23 1000  vancomycin (VANCOREADY) IVPB 1250 mg/250 mL  Status:  Discontinued        1,250 mg 166.7 mL/hr over 90 Minutes Intravenous Every 24 hours 06/14/23 0638 06/14/23 1545   06/14/23 0700  vancomycin (VANCOREADY) IVPB 1500 mg/300 mL        1,500 mg 150 mL/hr over 120 Minutes Intravenous  Once 06/14/23 0611 06/14/23 1938   06/14/23 0130  azithromycin (ZITHROMAX) 500 mg in sodium chloride 0.9 % 250 mL IVPB  Status:   Discontinued        500 mg 250 mL/hr over 60 Minutes Intravenous Daily at bedtime 06/14/23 0100 06/14/23 0611   06/14/23 0130  cefTRIAXone (ROCEPHIN) 1 g in sodium chloride 0.9 % 100 mL IVPB  Status:  Discontinued        1 g 200 mL/hr over 30 Minutes Intravenous Daily at bedtime 06/14/23 0100 06/14/23 1610        MEDICATIONS: Scheduled Meds:  apixaban  5 mg Oral BID   vitamin C  1,000 mg Oral Daily   atorvastatin  80 mg Oral Daily   Chlorhexidine Gluconate Cloth  6 each Topical Daily   digoxin  0.125 mg Oral Daily   feeding supplement  237 mL Oral BID BM   furosemide  20 mg Oral Daily   insulin aspart  0-15 Units Subcutaneous TID WC   insulin aspart  0-5 Units Subcutaneous QHS   insulin glargine-yfgn  15 Units Subcutaneous Daily   insulin starter kit- pen needles  1 kit Other Once   linezolid  600 mg Oral Q12H   living well with diabetes book   Does not apply Once   metoprolol tartrate  25 mg Oral BID   multivitamin with minerals  1 tablet Oral Daily   nutrition supplement (JUVEN)  1 packet Oral BID BM   pantoprazole  40 mg Oral Daily   sodium bicarbonate  650 mg Oral TID   sodium chloride flush  10-40 mL Intracatheter Q12H   tamsulosin  0.4 mg Oral Daily   zinc sulfate (50mg  elemental zinc)  220 mg Oral Daily   Continuous Infusions:  sodium chloride Stopped (06/21/23 1531)   ampicillin-sulbactam (UNASYN) IV 3 g (06/23/23 0813)   PRN Meds:.acetaminophen **OR** acetaminophen, guaiFENesin, ondansetron (ZOFRAN) IV, mouth rinse, sodium chloride flush   I have personally reviewed following labs and imaging studies  LABORATORY DATA: CBC: Recent Labs  Lab 06/19/23 0351 06/19/23 2206 06/20/23 0355 06/21/23 0428 06/22/23 0520 06/23/23 0742  WBC 8.0  --  7.4 6.7 10.6* 14.1*  HGB 7.3* 9.4* 9.6* 10.0* 10.5* 9.3*  HCT 23.4* 29.2* 30.5* 31.3* 32.7* 28.6*  MCV 87.0  --  84.5 83.2 83.8 83.6  PLT 110*  --  124* 129* 151 145*    Basic Metabolic Panel: Recent Labs  Lab  06/16/23 2031 06/17/23 0654 06/18/23 0321 06/19/23 0351 06/20/23 0355 06/21/23 0428 06/22/23 0520 06/23/23 0742  NA  --  131*   < >  133* 135 133* 135 135  K  --  4.5   < > 4.4 4.2 4.0 4.5 3.9  CL  --  107   < > 110 110 109 112* 112*  CO2  --  18*   < > 17* 16* 18* 17* 16*  GLUCOSE  --  149*   < > 241* 154* 160* 81 118*  BUN  --  20   < > 23 20 20 18 19   CREATININE  --  1.36*   < > 1.28* 1.24 1.44* 1.42* 1.48*  CALCIUM  --  7.7*   < > 7.6* 7.8* 7.9* 8.1* 7.8*  MG  --  2.0  --  1.8 1.7 1.9  --  1.5*  PHOS 3.5 2.8  --  2.1* 2.1* 2.7  --   --    < > = values in this interval not displayed.    GFR: Estimated Creatinine Clearance: 56.1 mL/min (A) (by C-G formula based on SCr of 1.48 mg/dL (H)).  Liver Function Tests: Recent Labs  Lab 06/19/23 0351 06/20/23 0355  AST 12*  --   ALT 13  --   ALKPHOS 37*  --   BILITOT 0.6  --   PROT 5.6*  --   ALBUMIN <1.5* 1.6*   No results for input(s): "LIPASE", "AMYLASE" in the last 168 hours.  No results for input(s): "AMMONIA" in the last 168 hours.   Coagulation Profile: No results for input(s): "INR", "PROTIME" in the last 168 hours.   Cardiac Enzymes: No results for input(s): "CKTOTAL", "CKMB", "CKMBINDEX", "TROPONINI" in the last 168 hours.  BNP (last 3 results) No results for input(s): "PROBNP" in the last 8760 hours.  Lipid Profile: No results for input(s): "CHOL", "HDL", "LDLCALC", "TRIG", "CHOLHDL", "LDLDIRECT" in the last 72 hours.  Thyroid Function Tests: No results for input(s): "TSH", "T4TOTAL", "FREET4", "T3FREE", "THYROIDAB" in the last 72 hours.  Anemia Panel: No results for input(s): "VITAMINB12", "FOLATE", "FERRITIN", "TIBC", "IRON", "RETICCTPCT" in the last 72 hours.  Urine analysis:    Component Value Date/Time   COLORURINE YELLOW 05/03/2020 1303   APPEARANCEUR CLEAR 05/03/2020 1303   LABSPEC 1.022 05/03/2020 1303   PHURINE 6.0 05/03/2020 1303   GLUCOSEU >=500 (A) 05/03/2020 1303   HGBUR NEGATIVE  05/03/2020 1303   BILIRUBINUR NEGATIVE 05/03/2020 1303   KETONESUR 80 (A) 05/03/2020 1303   PROTEINUR NEGATIVE 05/03/2020 1303   NITRITE NEGATIVE 05/03/2020 1303   LEUKOCYTESUR NEGATIVE 05/03/2020 1303    Sepsis Labs: Lactic Acid, Venous    Component Value Date/Time   LATICACIDVEN 8.7 (HH) 06/14/2023 0452    MICROBIOLOGY: Recent Results (from the past 240 hours)  Resp panel by RT-PCR (RSV, Flu A&B, Covid) Anterior Nasal Swab     Status: None   Collection Time: 06/13/23  8:14 PM   Specimen: Anterior Nasal Swab  Result Value Ref Range Status   SARS Coronavirus 2 by RT PCR NEGATIVE NEGATIVE Final   Influenza A by PCR NEGATIVE NEGATIVE Final   Influenza B by PCR NEGATIVE NEGATIVE Final    Comment: (NOTE) The Xpert Xpress SARS-CoV-2/FLU/RSV plus assay is intended as an aid in the diagnosis of influenza from Nasopharyngeal swab specimens and should not be used as a sole basis for treatment. Nasal washings and aspirates are unacceptable for Xpert Xpress SARS-CoV-2/FLU/RSV testing.  Fact Sheet for Patients: BloggerCourse.com  Fact Sheet for Healthcare Providers: SeriousBroker.it  This test is not yet approved or cleared by the [place] and has been authorized for  detection and/or diagnosis of SARS-CoV-2 by FDA under an Emergency Use Authorization (EUA). This EUA will remain in effect (meaning this test can be used) for the duration of the COVID-19 declaration under Section 564(b)(1) of the Act, 21 U.S.C. section 360bbb-3(b)(1), unless the authorization is terminated or revoked.     Resp Syncytial Virus by PCR NEGATIVE NEGATIVE Final    Comment: (NOTE) Fact Sheet for Patients: BloggerCourse.com  Fact Sheet for Healthcare Providers: SeriousBroker.it  This test is not yet approved or cleared by the [place] FDA and has been authorized for detection and/or  diagnosis of SARS-CoV-2 by FDA under an Emergency Use Authorization (EUA). This EUA will remain in effect (meaning this test can be used) for the duration of the COVID-19 declaration under Section 564(b)(1) of the Act, 21 U.S.C. section 360bbb-3(b)(1), unless the authorization is terminated or revoked.  Performed at [facility], [address] [place], [name] [address]   Culture, blood (Routine X 2) w Reflex to ID Panel     Status: None   Collection Time: 06/14/23  4:52 AM   Specimen: BLOOD  Result Value Ref Range Status   Specimen Description BLOOD RIGHT ANTECUBITAL  Final   Special Requests   Final    BOTTLES DRAWN AEROBIC AND ANAEROBIC Blood Culture results may not be optimal due to an inadequate volume of blood received in culture bottles   Culture   Final    NO GROWTH 5 DAYS Performed at [facility], [address] [place], [name] [address]    Report Status 06/19/2023 FINAL  Final  Culture, blood (Routine X 2) w Reflex to ID Panel     Status: None   Collection Time: 06/14/23  5:00 AM   Specimen: BLOOD  Result Value Ref Range Status   Specimen Description BLOOD RIGHT ANTECUBITAL  Final   Special Requests   Final    BOTTLES DRAWN AEROBIC AND ANAEROBIC Blood Culture results may not be optimal due to an inadequate volume of blood received in culture bottles   Culture   Final    NO GROWTH 5 DAYS Performed at [facility], [address] [place], [name] [address]    Report Status 06/19/2023 FINAL  Final  MRSA Next Gen by PCR, Nasal     Status: None   Collection Time: 06/14/23  5:15 AM   Specimen: Nasal Mucosa; Nasal Swab  Result Value Ref Range Status   MRSA by PCR Next Gen NOT DETECTED NOT DETECTED Final    Comment: (NOTE) The GeneXpert MRSA Assay (FDA approved for NASAL specimens only), is one component of a comprehensive MRSA colonization surveillance program. It is not intended to diagnose MRSA infection nor to guide or monitor treatment for  MRSA infections. Test performance is not FDA approved in patients less than 17 years old. Performed at [facility], [address] [place], [name] [address]   Aerobic/Anaerobic Culture w Gram Stain (surgical/deep wound)     Status: None (Preliminary result)   Collection Time: 06/21/23 10:32 AM   Specimen: Soft Tissue, Other  Result Value Ref Range Status   Specimen Description TISSUE  Final   Special Requests SOFT TISSUE  Final   Gram Stain   Final    RARE WBC PRESENT, PREDOMINANTLY PMN RARE GRAM POSITIVE COCCI IN PAIRS    Culture   Final    RARE STAPHYLOCOCCUS AUREUS CULTURE REINCUBATED FOR BETTER GROWTH SUSCEPTIBILITIES TO FOLLOW Performed at [facility],  [address], [place], [name] [address]    Report Status PENDING  Incomplete    RADIOLOGY STUDIES/RESULTS: DG CHEST PORT 1 VIEW Result Date: 06/23/2023 CLINICAL DATA:  63 year old male with congestive heart failure. EXAM: PORTABLE CHEST 1 VIEW COMPARISON:  Portable chest 06/14/2023 and earlier. FINDINGS: Portable AP semi upright view at 0616 hours. Left IJ approach central line removed. Stable lung volumes. Stable mild cardiomegaly. Visualized tracheal air column is within normal limits. Unresolved pulmonary vascular congestion with basilar predominance bilaterally. No pneumothorax. No consolidation. Trace fluid in the right minor fissure. No layering effusions are evident. Ventilation has not significantly changed. No acute osseous abnormality identified. Negative visible bowel gas. IMPRESSION: Pulmonary edema without significant change since 06/14/2023. Electronically Signed   By: Odessa Fleming M.D.   On: 06/23/2023 06:38     LOS: 10 days   Jeoffrey Massed, MD  [facility]    To contact the attending provider between 7A-7P or the covering provider during after hours 7P-7A, please log into the web site [email or web address] and access using universal  password for that web site. If you do not have the  password, please call the hospital operator.  06/23/2023, 10:59 AM

## 2023-06-23 NOTE — Plan of Care (Signed)
   Problem: Education: Goal: Knowledge of General Education information will improve Description: Including pain rating scale, medication(s)/side effects and non-pharmacologic comfort measures Outcome: Not Progressing

## 2023-06-23 NOTE — Care Management (Signed)
Patient coughing very loudly in room, assessed patient and noted vomit in the floor. Patient currently not answering any direct questions. PRN Zofran given.

## 2023-06-23 NOTE — TOC Progression Note (Addendum)
Transition of Care [facility]) - Progression Note    Patient Details  Name: Derek Thompson MRN: [id number] Date of Birth: May 04, 1960  Transition of Care [facility]) CM/SW Contact  Mearl Latin, LCSW Phone Number: 06/23/2023, 9:54 AM  Clinical Narrative:    9:45-CSW received call from patient's son D-Andrew [phone]. He and his uncle have returned to their home out of state but they have selected 1-[facility], 2-[facility]. CSW updated him that [place] with [employer] will be following up with family regarding Medicaid and Disability.   CSW updated [employer] of selection and will request they begin insurance authorization once stable.   11:00 AM-Per MD, ok to start auth process. CSW requested [facility] initiate.    Expected Discharge Plan: Skilled Nursing Facility Barriers to Discharge: Continued Medical Work up, English as a second language teacher  Expected Discharge Plan and Services In-house Referral: Clinical Social Work   Post Acute Care Choice: Skilled Nursing Facility Living arrangements for the past 2 months: Hotel/Motel                                       Social Determinants of Health (SDOH) Interventions SDOH Screenings   Food Insecurity: No Food Insecurity (06/14/2023)  Housing: Low Risk  (06/14/2023)  Transportation Needs: No Transportation Needs (06/14/2023)  Utilities: Not At Risk (06/14/2023)  Alcohol Screen: Low Risk  (08/19/2022)  Depression (PHQ2-9): Medium Risk (11/22/2022)  Financial Resource Strain: Low Risk  (08/19/2022)  Tobacco Use: Medium Risk (06/21/2023)    Readmission Risk Interventions     No data to display

## 2023-06-23 NOTE — Progress Notes (Signed)
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 Less awake and conversing today  Inpatient Medications    Scheduled Meds:  apixaban  5 mg Oral BID   vitamin C  1,000 mg Oral Daily   atorvastatin  80 mg Oral Daily   Chlorhexidine Gluconate Cloth  6 each Topical Daily   digoxin  0.125 mg Oral Daily   feeding supplement  237 mL Oral BID BM   furosemide  20 mg Oral Daily   insulin aspart  0-15 Units Subcutaneous TID WC   insulin aspart  0-5 Units Subcutaneous QHS   insulin glargine-yfgn  15 Units Subcutaneous Daily   insulin starter kit- pen needles  1 kit Other Once   linezolid  600 mg Oral Q12H   living well with diabetes book   Does not apply Once   metoprolol tartrate  25 mg Oral BID   multivitamin with minerals  1 tablet Oral Daily   nutrition supplement (JUVEN)  1 packet Oral BID BM   pantoprazole  40 mg Oral Daily   sodium bicarbonate  650 mg Oral TID   sodium chloride flush  10-40 mL Intracatheter Q12H   tamsulosin  0.4 mg Oral Daily   zinc sulfate (50mg  elemental zinc)  220 mg Oral Daily   Continuous Infusions:  sodium chloride Stopped (06/21/23 1531)   ampicillin-sulbactam (UNASYN) IV 3 g (06/23/23 0813)   PRN Meds: acetaminophen **OR** acetaminophen, guaiFENesin, ondansetron (ZOFRAN) IV, mouth rinse, sodium chloride flush   Vital Signs    Vitals:   06/23/23 0333 06/23/23 0500 06/23/23 0800 06/23/23 0900  BP: 114/64  (!) 88/53 (!) 96/52  Pulse:   (!) 101 95  Resp: 18  (!) 24 (!) 22  Temp: 98.3 F (36.8 C)     TempSrc: Oral     SpO2: 91%  95% 93%  Weight:  84.7 kg    Height:        Intake/Output Summary (Last 24 hours) at 06/23/2023  1056 Last data filed at 06/23/2023 0600 Gross per 24 hour  Intake 645.65 ml  Output 1600 ml  Net -954.35 ml   Net IO Since Admission: -2,912.42 mL [06/23/23 1056]     06/23/2023    5:00 AM 06/22/2023    5:00 AM 06/21/2023    3:28 AM  Last 3 Weights  Weight (lbs) 186 lb 11.7 oz 195 lb 5.2 oz 188 lb 15 oz  Weight (kg) 84.7 kg 88.6 kg 85.7 kg      Telemetry    Sinus rhythm with NSVT (asymptomatic)- Personally Reviewed  ECG    No new EKGs- Personally Reviewed  Physical Exam   Physical Exam  Constitutional:  Age appropriate, hemodynamically stable, no acute distress.   Neck: No JVD present.  Left IJ central line  Cardiovascular: Normal rate, regular rhythm, S1 normal and S2 normal. Exam reveals decreased pulses. Exam reveals no gallop and no friction rub.  Murmur heard. Holosystolic murmur is present with a grade of 3/6 at the apex. Pulses:      Carotid pulses are  on the right side with bruit. Pulmonary/Chest: He has no wheezes. He has bibasilar rales. He exhibits no tenderness.  Abdominal: Soft. Bowel sounds  are normal. He exhibits no distension. There is no abdominal tenderness.  Musculoskeletal:        General: Edema (bilateral +1) present. No tenderness.     Comments: Bilateral lower extremity wounds on the phalanges.  Right lower extremity warm to touch postoperative dressing present.  Skin: Skin is warm and dry.    Labs    High Sensitivity Troponin:   Recent Labs  Lab 06/14/23 1405  TROPONINIHS 66*     Chemistry Recent Labs  Lab 06/19/23 0351 06/20/23 0355 06/21/23 0428 06/22/23 0520 06/23/23 0742  NA 133* 135 133* 135 135  K 4.4 4.2 4.0 4.5 3.9  CL 110 110 109 112* 112*  CO2 17* 16* 18* 17* 16*  GLUCOSE 241* 154* 160* 81 118*  BUN 23 20 20 18 19   CREATININE 1.28* 1.24 1.44* 1.42* 1.48*  CALCIUM 7.6* 7.8* 7.9* 8.1* 7.8*  MG 1.8 1.7 1.9  --  1.5*  PROT 5.6*  --   --   --   --   ALBUMIN <1.5* 1.6*  --   --   --   AST 12*  --   --   --   --   ALT  13  --   --   --   --   ALKPHOS 37*  --   --   --   --   BILITOT 0.6  --   --   --   --   GFRNONAA >60 >60 55* 56* 53*  ANIONGAP 6 9 6 6 7     Lipids  No results for input(s): "CHOL", "TRIG", "HDL", "LABVLDL", "LDLCALC", "CHOLHDL" in the last 168 hours.   Hematology Recent Labs  Lab 06/21/23 0428 06/22/23 0520 06/23/23 0742  WBC 6.7 10.6* 14.1*  RBC 3.76* 3.90* 3.42*  HGB 10.0* 10.5* 9.3*  HCT 31.3* 32.7* 28.6*  MCV 83.2 83.8 83.6  MCH 26.6 26.9 27.2  MCHC 31.9 32.1 32.5  RDW 20.0* 20.2* 19.9*  PLT 129* 151 145*   Thyroid  No results for input(s): "TSH", "FREET4" in the last 168 hours.   BNP Recent Labs  Lab 06/19/23 0351 06/23/23 0742  BNP 2,164.5* 4,255.9*    DDimer No results for input(s): "DDIMER" in the last 168 hours.   Radiology    NA  Cardiac Studies   Echo 06/14/2023  1. Large 3 x 3 cm round heterogenous mass at the LV apex which is  suggestive of thrombus given severe LV dysfunction and less likely myxoma  - the mass did not take up Definity contrast (appears avascular). Left  ventricular ejection fraction, by  estimation, is 20 to 25%. Left ventricular ejection fraction by 2D MOD  biplane is 21.2 %. The left ventricle has severely decreased function. The  left ventricle demonstrates global hypokinesis. The left ventricular  internal cavity size was mildly  dilated. Left ventricular diastolic parameters are consistent with Grade  II diastolic dysfunction (pseudonormalization). Elevated left ventricular  end-diastolic pressure.   2. Right ventricular systolic function is mildly reduced. The right  ventricular size is normal. There is mildly elevated pulmonary artery  systolic pressure. The estimated right ventricular systolic pressure is  39.0 mmHg.   3. The mitral valve is abnormal. Moderate mitral valve regurgitation.   4. The aortic valve is tricuspid. Aortic valve regurgitation is not  visualized.   5. The inferior vena cava is dilated in size  with <50% respiratory  variability, suggesting right atrial pressure of 15 mmHg.   Comparison(s): Changes from  prior study are noted. 08/16/2022: LVEF 25%,  global hypokinesis.   Conclusion(s)/Recommendation(s): Critical findings reported to Dr. Francine Graven  and Elmer Picker, NP and acknowledged at 12:23 pm on 06/14/23.   Patient Profile     63 y.o. 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 Francine Graven.   Assessment & Plan    Chronic HFrEF - 08/2022 echo: LVEF 25% - 10/2022 cath: nonobstrutive CAD, CI 2.87, PCWP 15, PA mean 19 - 06/2023 echo: LVEF 20-25%, grade II dd, mild RV dysfunction, mod MR Net IO Since Admission: -2,912.42 mL [06/23/23 1056] Repeat BNP - trends up, CXR note congestion, primary team gave one dose IV lasix  Continue aspirin 81 mg p.o. daily. Continue atorvastatin 80 mg p.o. daily. Continue digoxin 125 mcg p.o. daily. Continue Lasix 20 mg p.o. daily  Continue Lopressor to 25 mg p.o. 3 times daily Started Entresto 24/26 mg p.o. twice daily and Aldactone 06/22/2023 with holding parameters parameters however, he is hypotensive therefore will hold these meds for now. Will focus on diuresis.  WBC trending up - currently on AxB, s/p surgery for osteomyelitis, consider checking lactic acid and monitor to Sepsis / SIRS as well. Discussed plan w/ primary team as well.  Will continue to follow.   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 Transitioned to Eliquis 06/22/2023  Recommend repeating either an echocardiogram with contrast or cardiac MRI in 3 months after being on anticoagulation to make  sure that the LV thrombus has resolved.  Septic shock secondary to osteomyelitis and right foot wet gangrene: Currently on broad-spectrum IV antibiotics and patient underwent first and second ray amputation on Wednesday, 06/21/2023.  NSVT:  Asymptomatic, noted on telemetry. Continue BB  Continue telemetry  Has undergone ischemic workup in the recent past-2024.  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 Cardio embolic secondary to LV thrombus. Primary following.   IDDM: Poorly controlled diabetic, with hemoglobin A1c 13.3, currently on insulin  HLD: LDL at goal. Continue Lipitor 80 mg po at bedtime  HTN: Soft BP - holding Entresto and Aldactone.   Diabetic right second toe infection.  Seen by vascular and orthopedic surgery  Underwent amputation on 06/21/2023  Non compliance w/ medical therapy.  Decided to stop insulin tx prior to arrival - per EMR.      For questions or updates, please contact [facility] Please consult [email or web address] for contact info under     Signed, Tessa Lerner, DO, [facility]  [facility]  [address] [place], [name] [address] Pager: [phone] Office: [phone] 06/23/2023, 10:56 AM

## 2023-06-23 NOTE — Plan of Care (Signed)
  Problem: Coping: Goal: Ability to adjust to condition or change in health will improve Outcome: Progressing   Problem: Fluid Volume: Goal: Ability to maintain a balanced intake and output will improve Outcome: Progressing   Problem: Metabolic: Goal: Ability to maintain appropriate glucose levels will improve Outcome: Progressing   Problem: Tissue Perfusion: Goal: Adequacy of tissue perfusion will improve Outcome: Progressing   Problem: Education: Goal: Knowledge of disease or condition will improve Outcome: Progressing   Problem: Self-Care: Goal: Ability to communicate needs accurately will improve Outcome: Progressing   Problem: Nutrition: Goal: Risk of aspiration will decrease Outcome: Progressing

## 2023-06-23 NOTE — Discharge Instructions (Addendum)
 Information on my medicine - ELIQUIS (apixaban)  This medication education was reviewed with me or my healthcare representative as part of my discharge preparation.    Why was Eliquis prescribed for you? Eliquis was prescribed for you to reduce the risk of a blood clot forming that can cause a stroke if you a medical condition called LV thrombus, which is a blood clot in one of the chambers of the heart.   What do You need to know about Eliquis ? Take your Eliquis TWICE DAILY - one tablet in the morning and one tablet in the evening with or without food. If you have difficulty swallowing the tablet whole please discuss with your pharmacist how to take the medication safely.  Take Eliquis exactly as prescribed by your doctor and DO NOT stop taking Eliquis without talking to the doctor who prescribed the medication.  Stopping may increase your risk of developing a stroke.  Refill your prescription before you run out.  After discharge, you should have regular check-up appointments with your healthcare provider that is prescribing your Eliquis.  In the future your dose may need to be changed if your kidney function or weight changes by a significant amount or as you get older.  What do you do if you miss a dose? If you miss a dose, take it as soon as you remember on the same day and resume taking twice daily.  Do not take more than one dose of ELIQUIS at the same time to make up a missed dose.  Important Safety Information A possible side effect of Eliquis is bleeding. You should call your healthcare provider right away if you experience any of the following: Bleeding from an injury or your nose that does not stop. Unusual colored urine (red or dark brown) or unusual colored stools (red or black). Unusual bruising for unknown reasons. A serious fall or if you hit your head (even if there is no bleeding).  Some medicines may interact with Eliquis and might increase your risk of bleeding  or clotting while on Eliquis. To help avoid this, consult your healthcare provider or pharmacist prior to using any new prescription or non-prescription medications, including herbals, vitamins, non-steroidal anti-inflammatory drugs (NSAIDs) and supplements.  This website has more information on Eliquis (apixaban): http://www.eliquis.com/eliquis/home     Follow with Primary MD Rana Snare, DO in 7 days must follow-up with Dr. Lajoyce Corners orthopedics and Dr. Rennis Golden cardiology within the next 1 to 2 weeks of discharge.  Get CBC, CMP, Magnesium, 2 view Chest X ray -  checked next visit with your primary MD or SNF MD    Activity: As tolerated with Full fall precautions use walker/cane & assistance as needed  Disposition SNF  Diet: Heart Healthy low carbohydrate diet with strict 1.5 L fluid restriction.  Check CBGs q. [facility].  Special Instructions: If you have smoked or chewed Tobacco  in the last 2 yrs please stop smoking, stop any regular Alcohol  and or any Recreational drug use.  On your next visit with your primary care physician please Get Medicines reviewed and adjusted.  Please request your Prim.MD to go over all Hospital Tests and Procedure/Radiological results at the follow up, please get all Hospital records sent to your Prim MD by signing hospital release before you go home.  If you experience worsening of your admission symptoms, develop shortness of breath, life threatening emergency, suicidal or homicidal thoughts you must seek medical attention immediately by calling 911 or calling your  MD immediately  if symptoms less severe.  You Must read complete instructions/literature along with all the possible adverse reactions/side effects for all the Medicines you take and that have been prescribed to you. Take any new Medicines after you have completely understood and accpet all the possible adverse reactions/side effects.   Do not drive when taking Pain medications.  Do not take more than  prescribed Pain, Sleep and Anxiety Medications

## 2023-06-24 DIAGNOSIS — I240 Acute coronary thrombosis not resulting in myocardial infarction: Secondary | ICD-10-CM | POA: Diagnosis not present

## 2023-06-24 DIAGNOSIS — I5021 Acute systolic (congestive) heart failure: Secondary | ICD-10-CM | POA: Diagnosis not present

## 2023-06-24 DIAGNOSIS — E1100 Type 2 diabetes mellitus with hyperosmolarity without nonketotic hyperglycemic-hyperosmolar coma (NKHHC): Secondary | ICD-10-CM | POA: Diagnosis not present

## 2023-06-24 DIAGNOSIS — I639 Cerebral infarction, unspecified: Secondary | ICD-10-CM | POA: Diagnosis not present

## 2023-06-24 DIAGNOSIS — I4729 Other ventricular tachycardia: Secondary | ICD-10-CM | POA: Diagnosis not present

## 2023-06-24 LAB — COMPREHENSIVE METABOLIC PANEL
ALT: 10 U/L (ref 0–44)
AST: 17 U/L (ref 15–41)
Albumin: <1.5 g/dL — ABNORMAL LOW (ref 3.5–5.0)
Alkaline Phosphatase: 34 U/L — ABNORMAL LOW (ref 38–126)
Anion gap: 7 (ref 5–15)
BUN: 23 mg/dL (ref 8–23)
CO2: 19 mmol/L — ABNORMAL LOW (ref 22–32)
Calcium: 7.9 mg/dL — ABNORMAL LOW (ref 8.9–10.3)
Chloride: 108 mmol/L (ref 98–111)
Creatinine, Ser: 1.47 mg/dL — ABNORMAL HIGH (ref 0.61–1.24)
GFR, Estimated: 53 mL/min — ABNORMAL LOW (ref >60)
Glucose, Bld: 142 mg/dL — ABNORMAL HIGH (ref 70–99)
Potassium: 4.1 mmol/L (ref 3.5–5.1)
Sodium: 134 mmol/L — ABNORMAL LOW (ref 135–145)
Total Bilirubin: 0.6 mg/dL (ref 0.0–1.2)
Total Protein: 5.6 g/dL — ABNORMAL LOW (ref 6.5–8.1)

## 2023-06-24 LAB — GLUCOSE, CAPILLARY
Glucose-Capillary: 143 mg/dL — ABNORMAL HIGH (ref 70–99)
Glucose-Capillary: 198 mg/dL — ABNORMAL HIGH (ref 70–99)
Glucose-Capillary: 187 mg/dL — ABNORMAL HIGH (ref 70–99)
Glucose-Capillary: 141 mg/dL — ABNORMAL HIGH (ref 70–99)

## 2023-06-24 LAB — AEROBIC/ANAEROBIC CULTURE W GRAM STAIN (SURGICAL/DEEP WOUND)

## 2023-06-24 LAB — CBC WITH DIFFERENTIAL/PLATELET
Abs Immature Granulocytes: 0.04 10*3/uL (ref 0.00–0.07)
Basophils Absolute: 0 10*3/uL (ref 0.0–0.1)
Basophils Relative: 0 %
Eosinophils Absolute: 0.1 10*3/uL (ref 0.0–0.5)
Eosinophils Relative: 0 %
HCT: 30 % — ABNORMAL LOW (ref 39.0–52.0)
Hemoglobin: 9.5 g/dL — ABNORMAL LOW (ref 13.0–17.0)
Immature Granulocytes: 0 %
Lymphocytes Relative: 9 %
Lymphs Abs: 1 10*3/uL (ref 0.7–4.0)
MCH: 27.2 pg (ref 26.0–34.0)
MCHC: 31.7 g/dL (ref 30.0–36.0)
MCV: 86 fL (ref 80.0–100.0)
Monocytes Absolute: 0.9 10*3/uL (ref 0.1–1.0)
Monocytes Relative: 7 %
Neutro Abs: 10 10*3/uL — ABNORMAL HIGH (ref 1.7–7.7)
Neutrophils Relative %: 84 %
Platelets: 142 10*3/uL — ABNORMAL LOW (ref 150–400)
RBC: 3.49 MIL/uL — ABNORMAL LOW (ref 4.22–5.81)
RDW: 20.2 % — ABNORMAL HIGH (ref 11.5–15.5)
WBC: 12 10*3/uL — ABNORMAL HIGH (ref 4.0–10.5)
nRBC: 0 % (ref 0.0–0.2)

## 2023-06-24 LAB — MAGNESIUM: Magnesium: 2.2 mg/dL (ref 1.7–2.4)

## 2023-06-24 LAB — DIGOXIN LEVEL: Digoxin Level: 0.4 ng/mL — ABNORMAL LOW (ref 0.8–2.0)

## 2023-06-24 LAB — PROCALCITONIN: Procalcitonin: 35.94 ng/mL

## 2023-06-24 MED ORDER — METOPROLOL SUCCINATE ER 50 MG PO TB24
50.0000 mg | ORAL_TABLET | Freq: Every day | ORAL | Status: DC
  Administered 2023-06-25: 50 mg via ORAL
  Filled 2023-06-24: qty 1

## 2023-06-24 MED ORDER — FUROSEMIDE 20 MG PO TABS
20.0000 mg | ORAL_TABLET | Freq: Every day | ORAL | Status: DC
Start: 2023-06-26 — End: 2023-06-26

## 2023-06-24 MED ORDER — METOPROLOL TARTRATE 25 MG PO TABS
25.0000 mg | ORAL_TABLET | Freq: Two times a day (BID) | ORAL | Status: AC
  Administered 2023-06-24: 25 mg via ORAL
  Filled 2023-06-24: qty 1

## 2023-06-24 NOTE — Progress Notes (Signed)
 PROGRESS NOTE        PATIENT DETAILS Name: Derek Thompson Age: 63 y.o. Sex: male Date of Birth: 1960/08/11 Admit Date: 06/13/2023 Admitting Physician Angie Fava, DO UJW:[id number], Casimiro Needle, DO  Brief Summary: Patient is a 63 y.o.  male with history of chronic HFrEF, DM-2, HTN, prostate cancer-s/p radiation 2022-who presented with generalized weakness/acute metabolic encephalopathy-he was found to have HHS secondary to right diabetic foot (great/second toe) with gangrene/osteomyelitis, LV thrombus and acute CVA.  He was stabilized in the ICU-and subsequently transferred to [facility] on 2/16.  Significant events: 2/12>> admit to [facility]-HHS/encephalopathy/diabetic foot-developed hypotension-started on Levophed-transfer to ICU. 2/14>> RLE angiogram-small vessel disease. 2/16>> transferred to [facility].  Significant studies: 2/11>> A1c: 13.3 2/12>> MRI brain: Acute infarct right frontal lobe, additional small infarct right cerebellum, left frontal/parietal/occipital lobe. 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 2/12>> renal ultrasound: No hydronephrosis 2/12>> echo: EF 20-25%, LV thrombus.  Grade 2 diastolic dysfunction.  Moderate mitral valve regurgitation 2/13>> LDL: 49.  Significant microbiology data: 2/11>> COVID/influenza/RSV PCR: Negative 2/12>> blood culture: Negative. 2/19>> intraoperative cultures/right foot: Rare Staph aureus  Procedures: 2/14>> RLE angiogram by vascular surgery-small vessel disease. 2/19>> right first and second ray amputation.  Consults: PCCM Orthopedics Cardiology Neurology  Subjective:  Patient in bed, appears comfortable, denies any headache, no fever, no chest pain or pressure, no shortness of breath , no abdominal pain. No new focal weakness.   Objective: Vitals: Blood pressure 112/73, pulse 77, temperature (!) 97.1 F (36.2 C), temperature source Oral,  resp. rate 13, height 6' (1.829 m), weight 83.4 kg, SpO2 98%.   Exam:  Awake Alert, No new F.N deficits, Normal affect [place].AT,PERRAL Supple Neck, No JVD,   Symmetrical Chest wall movement, Good air movement bilaterally, CTAB RRR,No Gallops, Rubs or new Murmurs,  +ve B.Sounds, Abd Soft, No tenderness,   Right foot under bandage s/p ray amputation  Assessment/Plan:  Septic shock secondary to right diabetic foot with wet gangrene/osteomyelitis involving great/second toe-s/p first and second ray amputation on 2/19 Sepsis physiology has improved-however slight worsening leukocytosis today Remains on Zyvox-Unasyn Final culture results from the intraoperative specimen, will require 3 weeks of antibiotics based on the results.  Acute metabolic encephalopathy Suspect this is mostly from sepsis-with some component from CVA Improving, currently no headache or focal deficits, monitor with supportive care, minimize benzodiazepines and narcotics  Acute embolic CVA Likely secondary to LV thrombus in the setting of severely reduced LVEF Initially on IV heparin-has been switched to Eliquis. Nonfocal exam  Acute on chronic HFrEF Slightly more short of breath today-has worsening bilateral lower extremity edema-blood pressure soft Holding Lasix for a day due to AKI on 06/24/2023 Hold Entresto/Aldactone Continue beta-blocker Follow closely.  LV thrombus Was on IV heparin-has been switched to Eliquis.  HHS Resolved-treated with IV fluids/IV insulin  AKI Likely hemodynamically mediated Hold Entresto and Aldactone, Lasix held on 06/24/2023, avoid drop in blood pressure, monitor bladder scans to rule out obstruction, continue alpha-blocker for underlying BPH  HTN BP soft Holding ARNI/Aldactone-continue low-dose metoprolol with holding parameters.  Normocytic anemia Secondary to critical illness No evidence of blood loss S/p 1 unit of PRBC on 2/17-Hb currently  stable.  Thrombocytopenia Likely due to sepsis physiology Slowly improving Follow.  Sick euthyroid syndrome TSH/free T4 mildly elevated Repeat thyroid function test in 4 to  6 weeks when he has recovered from acute illness.  DM-2 with poor control/uncontrolled hyperglycemia (A1c 13.3 on 2/11) CBGs stable Semglee 15 units+ SSI Follow/optimize.  Recent Labs    06/23/23 2016 06/23/23 2349 06/24/23 0336  GLUCAP 149* 142* 143*      BMI: Estimated body mass index is 24.94 kg/m as calculated from the following:   Height as of this encounter: 6' (1.829 m).   Weight as of this encounter: 83.4 kg.   Code status:   Code Status: Full Code   DVT Prophylaxis: SCD's Start: 06/21/23 1157 SCDs Start: 06/13/23 2205 apixaban (ELIQUIS) tablet 5 mg    Family Communication: Mother and 2 sons on 2/20   Disposition Plan: Status is: Inpatient Remains inpatient appropriate because: Severity of illness   Planned Discharge Destination:Skilled nursing facility   Diet: Diet Order             Diet Carb Modified Fluid consistency: Thin; Room service appropriate? No  Diet effective now                    MEDICATIONS: Scheduled Meds:  apixaban  5 mg Oral BID   vitamin C  1,000 mg Oral Daily   atorvastatin  80 mg Oral Daily   Chlorhexidine Gluconate Cloth  6 each Topical Daily   digoxin  0.125 mg Oral Daily   feeding supplement  237 mL Oral BID BM   [START ON 06/26/2023] furosemide  20 mg Oral Daily   insulin aspart  0-15 Units Subcutaneous TID WC   insulin aspart  0-5 Units Subcutaneous QHS   insulin glargine-yfgn  15 Units Subcutaneous Daily   insulin starter kit- pen needles  1 kit Other Once   linezolid  600 mg Oral Q12H   living well with diabetes book   Does not apply Once   metoprolol tartrate  25 mg Oral BID   multivitamin with minerals  1 tablet Oral Daily   nutrition supplement (JUVEN)  1 packet Oral BID BM   pantoprazole  40 mg Oral Daily   sodium bicarbonate   650 mg Oral TID   sodium chloride flush  10-40 mL Intracatheter Q12H   tamsulosin  0.4 mg Oral Daily   zinc sulfate (50mg  elemental zinc)  220 mg Oral Daily   Continuous Infusions:  sodium chloride 10 mL/hr at 06/23/23 1504   ampicillin-sulbactam (UNASYN) IV 3 g (06/24/23 0514)   PRN Meds:.acetaminophen **OR** acetaminophen, guaiFENesin, ondansetron (ZOFRAN) IV, mouth rinse   I have personally reviewed following labs and imaging studies  LABORATORY DATA:   Data Review:   Inpatient Medications  Scheduled Meds:  apixaban  5 mg Oral BID   vitamin C  1,000 mg Oral Daily   atorvastatin  80 mg Oral Daily   Chlorhexidine Gluconate Cloth  6 each Topical Daily   digoxin  0.125 mg Oral Daily   feeding supplement  237 mL Oral BID BM   [START ON 06/26/2023] furosemide  20 mg Oral Daily   insulin aspart  0-15 Units Subcutaneous TID WC   insulin aspart  0-5 Units Subcutaneous QHS   insulin glargine-yfgn  15 Units Subcutaneous Daily   insulin starter kit- pen needles  1 kit Other Once   linezolid  600 mg Oral Q12H   living well with diabetes book   Does not apply Once   metoprolol tartrate  25 mg Oral BID   multivitamin with minerals  1 tablet Oral Daily   nutrition supplement (JUVEN)  1 packet Oral BID BM   pantoprazole  40 mg Oral Daily   sodium bicarbonate  650 mg Oral TID   sodium chloride flush  10-40 mL Intracatheter Q12H   tamsulosin  0.4 mg Oral Daily   zinc sulfate (50mg  elemental zinc)  220 mg Oral Daily   Continuous Infusions:  sodium chloride 10 mL/hr at 06/23/23 1504   ampicillin-sulbactam (UNASYN) IV 3 g (06/24/23 0514)   PRN Meds:.acetaminophen **OR** acetaminophen, guaiFENesin, ondansetron (ZOFRAN) IV, mouth rinse  DVT Prophylaxis  SCD's Start: 06/21/23 1157 SCDs Start: 06/13/23 2205 apixaban (ELIQUIS) tablet 5 mg   Recent Labs  Lab 06/20/23 0355 06/21/23 0428 06/22/23 0520 06/23/23 0742 06/24/23 0621  WBC 7.4 6.7 10.6* 14.1* 12.0*  HGB 9.6* 10.0* 10.5*  9.3* 9.5*  HCT 30.5* 31.3* 32.7* 28.6* 30.0*  PLT 124* 129* 151 145* 142*  MCV 84.5 83.2 83.8 83.6 86.0  MCH 26.6 26.6 26.9 27.2 27.2  MCHC 31.5 31.9 32.1 32.5 31.7  RDW 20.1* 20.0* 20.2* 19.9* 20.2*  LYMPHSABS  --   --   --   --  1.0  MONOABS  --   --   --   --  0.9  EOSABS  --   --   --   --  0.1  BASOSABS  --   --   --   --  0.0    Recent Labs  Lab 06/19/23 0351 06/20/23 0355 06/21/23 0428 06/22/23 0520 06/23/23 0742 06/24/23 0621  NA 133* 135 133* 135 135 134*  K 4.4 4.2 4.0 4.5 3.9 4.1  CL 110 110 109 112* 112* 108  CO2 17* 16* 18* 17* 16* 19*  ANIONGAP 6 9 6 6 7 7   GLUCOSE 241* 154* 160* 81 118* 142*  BUN 23 20 20 18 19 23   CREATININE 1.28* 1.24 1.44* 1.42* 1.48* 1.47*  AST 12*  --   --   --   --  17  ALT 13  --   --   --   --  10  ALKPHOS 37*  --   --   --   --  34*  BILITOT 0.6  --   --   --   --  0.6  ALBUMIN <1.5* 1.6*  --   --   --  <1.5*  BNP 2,164.5*  --   --   --  4,255.9*  --   MG 1.8 1.7 1.9  --  1.5* 2.2  PHOS 2.1* 2.1* 2.7  --   --   --   CALCIUM 7.6* 7.8* 7.9* 8.1* 7.8* 7.9*      Recent Labs  Lab 06/19/23 0351 06/20/23 0355 06/21/23 0428 06/22/23 0520 06/23/23 0742 06/24/23 0621  BNP 2,164.5*  --   --   --  4,255.9*  --   MG 1.8 1.7 1.9  --  1.5* 2.2  CALCIUM 7.6* 7.8* 7.9* 8.1* 7.8* 7.9*    --------------------------------------------------------------------------------------------------------------- Lab Results  Component Value Date   CHOL 85 06/15/2023   HDL 10 (L) 06/15/2023   LDLCALC 49 06/15/2023   TRIG 131 06/15/2023   CHOLHDL 8.5 06/15/2023    Lab Results  Component Value Date   HGBA1C 13.3 (H) 06/13/2023   No results for input(s): "TSH", "T4TOTAL", "FREET4", "T3FREE", "THYROIDAB" in the last 72 hours. No results for input(s): "VITAMINB12", "FOLATE", "FERRITIN", "TIBC", "IRON", "RETICCTPCT" in the last 72  hours. ------------------------------------------------------------------------------------------------------------------ Cardiac Enzymes No results for input(s): "CKMB", "TROPONINI", "MYOGLOBIN" in the last 168 hours.  Invalid input(s): "  CK"  Micro Results Recent Results (from the past 240 hours)  Aerobic/Anaerobic Culture w Gram Stain (surgical/deep wound)     Status: None (Preliminary result)   Collection Time: 06/21/23 10:32 AM   Specimen: Soft Tissue, Other  Result Value Ref Range Status   Specimen Description TISSUE  Final   Special Requests SOFT TISSUE  Final   Gram Stain   Final    RARE WBC PRESENT, PREDOMINANTLY PMN RARE GRAM POSITIVE COCCI IN PAIRS    Culture   Final    RARE STAPHYLOCOCCUS AUREUS FEW STREPTOCOCCUS ANGINOSIS SUSCEPTIBILITIES TO FOLLOW HOLDING FOR POSSIBLE ANAEROBE Performed at [facility], [address] [place], [name] [address]    Report Status PENDING  Incomplete    Radiology Reports  DG CHEST PORT 1 VIEW Result Date: 06/23/2023 CLINICAL DATA:  63 year old male with congestive heart failure. EXAM: PORTABLE CHEST 1 VIEW COMPARISON:  Portable chest 06/14/2023 and earlier. FINDINGS: Portable AP semi upright view at 0616 hours. Left IJ approach central line removed. Stable lung volumes. Stable mild cardiomegaly. Visualized tracheal air column is within normal limits. Unresolved pulmonary vascular congestion with basilar predominance bilaterally. No pneumothorax. No consolidation. Trace fluid in the right minor fissure. No layering effusions are evident. Ventilation has not significantly changed. No acute osseous abnormality identified. Negative visible bowel gas. IMPRESSION: Pulmonary edema without significant change since 06/14/2023. Electronically Signed   By: Odessa Fleming M.D.   On: 06/23/2023 06:38      Signature  -   Susa Raring M.D on 06/24/2023 at 9:13 AM   -  To page go to [email or web address]

## 2023-06-24 NOTE — Progress Notes (Signed)
 Rounding Note    Patient Name: Derek Thompson Date of Encounter: 06/24/2023  [facility] Cardiologist: [name], MD  Chief Complaint: Weakness Reason of consult: Evaluation of large LV apex thrombus  Subjective   Denies anginal chest pain, orthopnea, PND Much more awake and alert compared to yesterday  Inpatient Medications    Scheduled Meds:  apixaban  5 mg Oral BID   vitamin C  1,000 mg Oral Daily   atorvastatin  80 mg Oral Daily   Chlorhexidine Gluconate Cloth  6 each Topical Daily   digoxin  0.125 mg Oral Daily   feeding supplement  237 mL Oral BID BM   [START ON 06/26/2023] furosemide  20 mg Oral Daily   insulin aspart  0-15 Units Subcutaneous TID WC   insulin aspart  0-5 Units Subcutaneous QHS   insulin glargine-yfgn  15 Units Subcutaneous Daily   insulin starter kit- pen needles  1 kit Other Once   linezolid  600 mg Oral Q12H   living well with diabetes book   Does not apply Once   [START ON 06/25/2023] metoprolol succinate  50 mg Oral Daily   metoprolol tartrate  25 mg Oral BID   multivitamin with minerals  1 tablet Oral Daily   nutrition supplement (JUVEN)  1 packet Oral BID BM   pantoprazole  40 mg Oral Daily   sodium bicarbonate  650 mg Oral TID   sodium chloride flush  10-40 mL Intracatheter Q12H   tamsulosin  0.4 mg Oral Daily   zinc sulfate (50mg  elemental zinc)  220 mg Oral Daily   Continuous Infusions:  sodium chloride 10 mL/hr at 06/23/23 1504   ampicillin-sulbactam (UNASYN) IV 3 g (06/24/23 0514)   PRN Meds: acetaminophen **OR** acetaminophen, guaiFENesin, ondansetron (ZOFRAN) IV, mouth rinse   Vital Signs    Vitals:   06/23/23 2348 06/24/23 0334 06/24/23 0459 06/24/23 0800  BP: 111/60 112/73  104/62  Pulse:    80  Resp:    17  Temp: 97.7 F (36.5 C) (!) 97.1 F (36.2 C)  97.9 F (36.6 C)  TempSrc: Oral Oral  Oral  SpO2:    100%  Weight:   83.4 kg   Height:        Intake/Output Summary (Last 24 hours) at 06/24/2023  1052 Last data filed at 06/24/2023 1610 Gross per 24 hour  Intake 209.33 ml  Output 1300 ml  Net -1090.67 ml   Net IO Since Admission: -4,903.09 mL [06/24/23 1052]     06/24/2023    4:59 AM 06/23/2023    5:00 AM 06/22/2023    5:00 AM  Last 3 Weights  Weight (lbs) 183 lb 13.8 oz 186 lb 11.7 oz 195 lb 5.2 oz  Weight (kg) 83.4 kg 84.7 kg 88.6 kg      Telemetry    Sinus rhythm without ectopy- Personally Reviewed  ECG    06/23/2023: Sinus tachycardia, 133 bpm, left bundle branch block ST-T changes in inferolateral leads cannot rule out ischemia.  Similar findings on prior EKG dated 06/13/2023.- Personally Reviewed  Physical Exam   Physical Exam  Constitutional:  Age appropriate, hemodynamically stable, no acute distress.   Neck: No JVD present.  Left IJ central line  Cardiovascular: Normal rate, regular rhythm, S1 normal and S2 normal. Exam reveals decreased pulses. Exam reveals no gallop and no friction rub.  Murmur heard. Holosystolic murmur is present with a grade of 3/6 at the apex. Pulses:  Carotid pulses are  on the right side with bruit. Pulmonary/Chest: He has no wheezes. He has bibasilar rales. He exhibits no tenderness.  Abdominal: Soft. Bowel sounds are normal. He exhibits no distension. There is no abdominal tenderness.  Musculoskeletal:        General: Edema (bilateral +1) present. No tenderness.     Comments: Bilateral lower extremity wounds on the phalanges.  Right lower extremity warm to touch postoperative dressing present.  Skin: Skin is warm and dry.    Labs    High Sensitivity Troponin:   Recent Labs  Lab 06/14/23 1405  TROPONINIHS 66*     Chemistry Recent Labs  Lab 06/19/23 0351 06/20/23 0355 06/21/23 0428 06/22/23 0520 06/23/23 0742 06/24/23 0621  NA 133* 135 133* 135 135 134*  K 4.4 4.2 4.0 4.5 3.9 4.1  CL 110 110 109 112* 112* 108  CO2 17* 16* 18* 17* 16* 19*  GLUCOSE 241* 154* 160* 81 118* 142*  BUN 23 20 20 18 19 23   CREATININE  1.28* 1.24 1.44* 1.42* 1.48* 1.47*  CALCIUM 7.6* 7.8* 7.9* 8.1* 7.8* 7.9*  MG 1.8 1.7 1.9  --  1.5* 2.2  PROT 5.6*  --   --   --   --  5.6*  ALBUMIN <1.5* 1.6*  --   --   --  <1.5*  AST 12*  --   --   --   --  17  ALT 13  --   --   --   --  10  ALKPHOS 37*  --   --   --   --  34*  BILITOT 0.6  --   --   --   --  0.6  GFRNONAA >60 >60 55* 56* 53* 53*  ANIONGAP 6 9 6 6 7 7     Lipids  No results for input(s): "CHOL", "TRIG", "HDL", "LABVLDL", "LDLCALC", "CHOLHDL" in the last 168 hours.   Hematology Recent Labs  Lab 06/22/23 0520 06/23/23 0742 06/24/23 0621  WBC 10.6* 14.1* 12.0*  RBC 3.90* 3.42* 3.49*  HGB 10.5* 9.3* 9.5*  HCT 32.7* 28.6* 30.0*  MCV 83.8 83.6 86.0  MCH 26.9 27.2 27.2  MCHC 32.1 32.5 31.7  RDW 20.2* 19.9* 20.2*  PLT 151 145* 142*   Thyroid  No results for input(s): "TSH", "FREET4" in the last 168 hours.   BNP Recent Labs  Lab 06/19/23 0351 06/23/23 0742  BNP 2,164.5* 4,255.9*    DDimer No results for input(s): "DDIMER" in the last 168 hours.   Radiology    NA  Cardiac Studies   Echo 06/14/2023  1. Large 3 x 3 cm round heterogenous mass at the LV apex which is  suggestive of thrombus given severe LV dysfunction and less likely myxoma  - the mass did not take up Definity contrast (appears avascular). Left  ventricular ejection fraction, by  estimation, is 20 to 25%. Left ventricular ejection fraction by 2D MOD  biplane is 21.2 %. The left ventricle has severely decreased function. The  left ventricle demonstrates global hypokinesis. The left ventricular  internal cavity size was mildly  dilated. Left ventricular diastolic parameters are consistent with Grade  II diastolic dysfunction (pseudonormalization). Elevated left ventricular  end-diastolic pressure.   2. Right ventricular systolic function is mildly reduced. The right  ventricular size is normal. There is mildly elevated pulmonary artery  systolic pressure. The estimated right  ventricular systolic pressure is  39.0 mmHg.   3. The mitral valve is abnormal.  Moderate mitral valve regurgitation.   4. The aortic valve is tricuspid. Aortic valve regurgitation is not  visualized.   5. The inferior vena cava is dilated in size with <50% respiratory  variability, suggesting right atrial pressure of 15 mmHg.   Comparison(s): Changes from prior study are noted. 08/16/2022: LVEF 25%,  global hypokinesis.   Conclusion(s)/Recommendation(s): Critical findings reported to Dr. Francine Graven  and Elmer Picker, NP and acknowledged at 12:23 pm on 06/14/23.   Patient Profile     63 y.o. 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 Francine Graven.   Assessment & Plan    Acute on Chronic HFrEF - 08/2022 echo: LVEF 25% - 10/2022 cath: nonobstrutive CAD, CI 2.87, PCWP 15, PA mean 19 - 06/2023 echo: LVEF 20-25%, grade II dd, mild RV dysfunction, mod MR Net IO Since Admission: -4,903.09 mL [06/24/23 1052] Repeat BNP - trends up, CXR note congestion, treatment transitioning IV Lasix to p.o. Continue aspirin 81 mg p.o. daily. Continue atorvastatin 80 mg p.o. daily. Continue digoxin 125 mcg p.o. daily. Continue Lasix 20 mg p.o. daily  Transition Lopressor to 25 mg p.o. twice daily to Toprol-XL 50 mg p.o. daily starting tomorrow Started Entresto 24/26 mg p.o. twice daily and Aldactone 06/22/2023 with holding parameters parameters however, he became hypotensive therefore will hold these meds for now. Will focus on diuresis as BMP started to trend up.  Once renal function back to baseline we will restart uptitration of GDMT Will check digoxin level in the morning.  LV apical thrombus 06/2023: Large 3 x 3 cm round heterogenous mass at the LV apex which is  suggestive of thrombus given severe LV dysfunction  and less likely myxoma - the mass did not take up Definity contrast (appears avascular). Transitioned to Eliquis 06/22/2023  Recommend repeating either an echocardiogram with contrast or cardiac MRI in 3 months after being on anticoagulation to make sure that the LV thrombus has resolved.  Septic shock secondary to osteomyelitis and right foot wet gangrene: Currently on broad-spectrum IV antibiotics and patient underwent first and second ray amputation on Wednesday, 06/21/2023.  NSVT:  No recurrence of NSVT on telemetry Transitioning Lopressor to Toprol-XL as discussed above  Continue to monitor  Has undergone ischemic workup in the recent past-2024.  Given the fact that he is chest pain-free and recently had an acute/early subacute CVA no additional invasive cardiovascular testing warranted at this time.  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 Cardio embolic secondary to LV thrombus. Primary following.   IDDM: Poorly controlled diabetic, with hemoglobin A1c 13.3, currently on insulin  HLD: LDL at goal. Continue Lipitor 80 mg po at bedtime  HTN: Blood pressures are improving compared to yesterday.  Holding off on Entresto and spironolactone for now until renal function improves.    Diabetic right second toe infection.  Seen by vascular and orthopedic surgery  Underwent amputation on 06/21/2023  Non compliance w/ medical therapy.  Decided to stop insulin tx prior to arrival - per EMR.      For questions or updates, please contact [facility] Please consult [email or web address] for contact info under     Signed, Tessa Lerner, DO, [facility]  [facility]  [address] [place], [name] [address] Pager: [phone] Office: [phone] 06/24/2023, 10:52 AM

## 2023-06-24 NOTE — Plan of Care (Signed)

## 2023-06-24 NOTE — Progress Notes (Signed)
 Patient ID: Derek Thompson, male   DOB: Feb 18, 1961, 63 y.o.   MRN: [id number] Patient is status post first and second ray amputation with purulent abscess.  Cultures are showing staph and strep with sensitivities pending.  Patient may discharge to skilled nursing once the cultures are finalized and oral antibiotic is determined.  I will place an order to have the dressing changed at time of discharge.

## 2023-06-25 DIAGNOSIS — I4729 Other ventricular tachycardia: Secondary | ICD-10-CM | POA: Diagnosis not present

## 2023-06-25 DIAGNOSIS — I5021 Acute systolic (congestive) heart failure: Secondary | ICD-10-CM | POA: Diagnosis not present

## 2023-06-25 DIAGNOSIS — I240 Acute coronary thrombosis not resulting in myocardial infarction: Secondary | ICD-10-CM | POA: Diagnosis not present

## 2023-06-25 DIAGNOSIS — I639 Cerebral infarction, unspecified: Secondary | ICD-10-CM | POA: Diagnosis not present

## 2023-06-25 LAB — GLUCOSE, CAPILLARY
Glucose-Capillary: 112 mg/dL — ABNORMAL HIGH (ref 70–99)
Glucose-Capillary: 165 mg/dL — ABNORMAL HIGH (ref 70–99)
Glucose-Capillary: 198 mg/dL — ABNORMAL HIGH (ref 70–99)
Glucose-Capillary: 288 mg/dL — ABNORMAL HIGH (ref 70–99)
Glucose-Capillary: 226 mg/dL — ABNORMAL HIGH (ref 70–99)

## 2023-06-25 MED ORDER — AMOXICILLIN-POT CLAVULANATE 875-125 MG PO TABS
1.0000 | ORAL_TABLET | Freq: Two times a day (BID) | ORAL | Status: DC
  Administered 2023-06-25 – 2023-06-29 (×9): 1 via ORAL
  Filled 2023-06-25 (×9): qty 1

## 2023-06-25 MED ORDER — DIGOXIN 125 MCG PO TABS
0.2500 mg | ORAL_TABLET | Freq: Every day | ORAL | Status: DC
  Administered 2023-06-26: 0.25 mg via ORAL
  Filled 2023-06-25: qty 2

## 2023-06-25 NOTE — Progress Notes (Signed)
 PROGRESS NOTE        PATIENT DETAILS Name: Derek Thompson Age: 63 y.o. Sex: male Date of Birth: 01-28-1961 Admit Date: 06/13/2023 Admitting Physician Angie Fava, DO BJY:NWGNF, Casimiro Needle, DO  Brief Summary: Patient is a 63 y.o.  male with history of chronic HFrEF, DM-2, HTN, prostate cancer-s/p radiation 2022-who presented with generalized weakness/acute metabolic encephalopathy-he was found to have HHS secondary to right diabetic foot (great/second toe) with gangrene/osteomyelitis, LV thrombus and acute CVA.  He was stabilized in the ICU-and subsequently transferred to [facility] on 2/16.  Significant events: 2/12>> admit to [facility]-HHS/encephalopathy/diabetic foot-developed hypotension-started on Levophed-transfer to ICU. 2/14>> RLE angiogram-small vessel disease. 2/16>> transferred to [facility].  Significant studies: 2/11>> A1c: 13.3 2/12>> MRI brain: Acute infarct right frontal lobe, additional small infarct right cerebellum, left frontal/parietal/occipital lobe. 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 2/12>> renal ultrasound: No hydronephrosis 2/12>> echo: EF 20-25%, LV thrombus.  Grade 2 diastolic dysfunction.  Moderate mitral valve regurgitation 2/13>> LDL: 49.  Significant microbiology data: 2/11>> COVID/influenza/RSV PCR: Negative 2/12>> blood culture: Negative. 2/19>> intraoperative cultures/right foot: Rare Staph aureus  Procedures: 2/14>> RLE angiogram by vascular surgery-small vessel disease. 2/19>> right first and second ray amputation.  Consults: PCCM Orthopedics Cardiology Neurology  Subjective:  Patient in bed, appears comfortable, denies any headache, no fever, no chest pain or pressure, no shortness of breath , no abdominal pain. No new focal weakness.  Objective: Vitals: Blood pressure (!) 107/59, pulse 79, temperature (!) 97.5 F (36.4 C), temperature source Oral,  resp. rate 12, height 6' (1.829 m), weight 84.3 kg, SpO2 98%.   Exam:  Awake Alert, No new F.N deficits, Normal affect [place].AT,PERRAL Supple Neck, No JVD,   Symmetrical Chest wall movement, Good air movement bilaterally, CTAB RRR,No Gallops, Rubs or new Murmurs,  +ve B.Sounds, Abd Soft, No tenderness,   Right foot under bandage s/p ray amputation  Assessment/Plan:  Septic shock secondary to right diabetic foot with wet gangrene/osteomyelitis involving great/second toe-s/p first and second ray amputation on 2/19 Sepsis physiology has improved-however slight worsening leukocytosis today Remains on Zyvox-Unasyn Final culture results from the intraoperative specimen, will require 3 weeks of antibiotics based on the results.  Acute metabolic encephalopathy Suspect this is mostly from sepsis-with some component from CVA Improving, currently no headache or focal deficits, monitor with supportive care, minimize benzodiazepines and narcotics  Acute embolic CVA Likely secondary to LV thrombus in the setting of severely reduced LVEF Initially on IV heparin-has been switched to Eliquis. Nonfocal exam  Acute on chronic HFrEF Slightly more short of breath today-has worsening bilateral lower extremity edema-blood pressure soft Holding Lasix for a day due to AKI on 06/24/2023 Hold Entresto/Aldactone Continue beta-blocker Follow closely.  LV thrombus Was on IV heparin-has been switched to Eliquis.  HHS Resolved-treated with IV fluids/IV insulin  AKI Likely hemodynamically mediated Hold Entresto and Aldactone, Lasix held on 06/24/2023, avoid drop in blood pressure, monitor bladder scans to rule out obstruction, continue alpha-blocker for underlying BPH  HTN BP soft Holding ARNI/Aldactone-continue low-dose metoprolol with holding parameters.  Normocytic anemia Secondary to critical illness No evidence of blood loss S/p 1 unit of PRBC on 2/17-Hb currently  stable.  Thrombocytopenia Likely due to sepsis physiology Slowly improving Follow.  Sick euthyroid syndrome TSH/free T4 mildly elevated Repeat thyroid function test in 4 to  6 weeks when he has recovered from acute illness.  DM-2 with poor control/uncontrolled hyperglycemia (A1c 13.3 on 2/11) CBGs stable Semglee 15 units+ SSI Follow/optimize.  Recent Labs    06/24/23 1222 06/24/23 2043 06/25/23 0736  GLUCAP 187* 141* 112*      BMI: Estimated body mass index is 25.21 kg/m as calculated from the following:   Height as of this encounter: 6' (1.829 m).   Weight as of this encounter: 84.3 kg.   Code status:   Code Status: Full Code   DVT Prophylaxis: SCD's Start: 06/21/23 1157 SCDs Start: 06/13/23 2205 apixaban (ELIQUIS) tablet 5 mg    Family Communication: Mother and 2 sons on 2/20   Disposition Plan: Status is: Inpatient Remains inpatient appropriate because: Severity of illness   Planned Discharge Destination:Skilled nursing facility   Diet: Diet Order             Diet Carb Modified Fluid consistency: Thin; Room service appropriate? No  Diet effective now                    MEDICATIONS: Scheduled Meds:  apixaban  5 mg Oral BID   vitamin C  1,000 mg Oral Daily   atorvastatin  80 mg Oral Daily   Chlorhexidine Gluconate Cloth  6 each Topical Daily   digoxin  0.125 mg Oral Daily   feeding supplement  237 mL Oral BID BM   [START ON 06/26/2023] furosemide  20 mg Oral Daily   insulin aspart  0-15 Units Subcutaneous TID WC   insulin aspart  0-5 Units Subcutaneous QHS   insulin glargine-yfgn  15 Units Subcutaneous Daily   insulin starter kit- pen needles  1 kit Other Once   linezolid  600 mg Oral Q12H   living well with diabetes book   Does not apply Once   metoprolol succinate  50 mg Oral Daily   multivitamin with minerals  1 tablet Oral Daily   nutrition supplement (JUVEN)  1 packet Oral BID BM   pantoprazole  40 mg Oral Daily   sodium bicarbonate   650 mg Oral TID   sodium chloride flush  10-40 mL Intracatheter Q12H   tamsulosin  0.4 mg Oral Daily   zinc sulfate (50mg  elemental zinc)  220 mg Oral Daily   Continuous Infusions:  sodium chloride 10 mL/hr at 06/23/23 1504   ampicillin-sulbactam (UNASYN) IV 3 g (06/25/23 0151)   PRN Meds:.acetaminophen **OR** acetaminophen, guaiFENesin, ondansetron (ZOFRAN) IV, mouth rinse   I have personally reviewed following labs and imaging studies  LABORATORY DATA:   Data Review:   Inpatient Medications  Scheduled Meds:  apixaban  5 mg Oral BID   vitamin C  1,000 mg Oral Daily   atorvastatin  80 mg Oral Daily   Chlorhexidine Gluconate Cloth  6 each Topical Daily   digoxin  0.125 mg Oral Daily   feeding supplement  237 mL Oral BID BM   [START ON 06/26/2023] furosemide  20 mg Oral Daily   insulin aspart  0-15 Units Subcutaneous TID WC   insulin aspart  0-5 Units Subcutaneous QHS   insulin glargine-yfgn  15 Units Subcutaneous Daily   insulin starter kit- pen needles  1 kit Other Once   linezolid  600 mg Oral Q12H   living well with diabetes book   Does not apply Once   metoprolol succinate  50 mg Oral Daily   multivitamin with minerals  1 tablet Oral Daily   nutrition supplement (JUVEN)  1 packet Oral BID BM   pantoprazole  40 mg Oral Daily   sodium bicarbonate  650 mg Oral TID   sodium chloride flush  10-40 mL Intracatheter Q12H   tamsulosin  0.4 mg Oral Daily   zinc sulfate (50mg  elemental zinc)  220 mg Oral Daily   Continuous Infusions:  sodium chloride 10 mL/hr at 06/23/23 1504   ampicillin-sulbactam (UNASYN) IV 3 g (06/25/23 0151)   PRN Meds:.acetaminophen **OR** acetaminophen, guaiFENesin, ondansetron (ZOFRAN) IV, mouth rinse  DVT Prophylaxis  SCD's Start: 06/21/23 1157 SCDs Start: 06/13/23 2205 apixaban (ELIQUIS) tablet 5 mg   Recent Labs  Lab 06/20/23 0355 06/21/23 0428 06/22/23 0520 06/23/23 0742 06/24/23 0621  WBC 7.4 6.7 10.6* 14.1* 12.0*  HGB 9.6* 10.0*  10.5* 9.3* 9.5*  HCT 30.5* 31.3* 32.7* 28.6* 30.0*  PLT 124* 129* 151 145* 142*  MCV 84.5 83.2 83.8 83.6 86.0  MCH 26.6 26.6 26.9 27.2 27.2  MCHC 31.5 31.9 32.1 32.5 31.7  RDW 20.1* 20.0* 20.2* 19.9* 20.2*  LYMPHSABS  --   --   --   --  1.0  MONOABS  --   --   --   --  0.9  EOSABS  --   --   --   --  0.1  BASOSABS  --   --   --   --  0.0    Recent Labs  Lab 06/19/23 0351 06/20/23 0355 06/21/23 0428 06/22/23 0520 06/23/23 0742 06/24/23 0621  NA 133* 135 133* 135 135 134*  K 4.4 4.2 4.0 4.5 3.9 4.1  CL 110 110 109 112* 112* 108  CO2 17* 16* 18* 17* 16* 19*  ANIONGAP 6 9 6 6 7 7   GLUCOSE 241* 154* 160* 81 118* 142*  BUN 23 20 20 18 19 23   CREATININE 1.28* 1.24 1.44* 1.42* 1.48* 1.47*  AST 12*  --   --   --   --  17  ALT 13  --   --   --   --  10  ALKPHOS 37*  --   --   --   --  34*  BILITOT 0.6  --   --   --   --  0.6  ALBUMIN <1.5* 1.6*  --   --   --  <1.5*  PROCALCITON  --   --   --   --   --  35.94  BNP 2,164.5*  --   --   --  4,255.9*  --   MG 1.8 1.7 1.9  --  1.5* 2.2  PHOS 2.1* 2.1* 2.7  --   --   --   CALCIUM 7.6* 7.8* 7.9* 8.1* 7.8* 7.9*      Recent Labs  Lab 06/19/23 0351 06/20/23 0355 06/21/23 0428 06/22/23 0520 06/23/23 0742 06/24/23 0621  PROCALCITON  --   --   --   --   --  35.94  BNP 2,164.5*  --   --   --  4,255.9*  --   MG 1.8 1.7 1.9  --  1.5* 2.2  CALCIUM 7.6* 7.8* 7.9* 8.1* 7.8* 7.9*    --------------------------------------------------------------------------------------------------------------- Lab Results  Component Value Date   CHOL 85 06/15/2023   HDL 10 (L) 06/15/2023   LDLCALC 49 06/15/2023   TRIG 131 06/15/2023   CHOLHDL 8.5 06/15/2023    Lab Results  Component Value Date   HGBA1C 13.3 (H) 06/13/2023   No results for input(s): "TSH", "T4TOTAL", "FREET4", "T3FREE", "THYROIDAB" in the last  72 hours. No results for input(s): "VITAMINB12", "FOLATE", "FERRITIN", "TIBC", "IRON", "RETICCTPCT" in the last 72  hours. ------------------------------------------------------------------------------------------------------------------ Cardiac Enzymes No results for input(s): "CKMB", "TROPONINI", "MYOGLOBIN" in the last 168 hours.  Invalid input(s): "CK"  Micro Results Recent Results (from the past 240 hours)  Aerobic/Anaerobic Culture w Gram Stain (surgical/deep wound)     Status: None   Collection Time: 06/21/23 10:32 AM   Specimen: Soft Tissue, Other  Result Value Ref Range Status   Specimen Description TISSUE  Final   Special Requests SOFT TISSUE  Final   Gram Stain   Final    RARE WBC PRESENT, PREDOMINANTLY PMN RARE GRAM POSITIVE COCCI IN PAIRS    Culture   Final    RARE STAPHYLOCOCCUS AUREUS FEW STREPTOCOCCUS ANGINOSIS RARE BACTEROIDES FRAGILIS RARE BACTEROIDES THETAIOTAOMICRON BETA LACTAMASE POSITIVE Performed at [facility], [address], [place], [name] [address]    Report Status 06/24/2023 FINAL  Final   Organism ID, Bacteria STAPHYLOCOCCUS AUREUS  Final   Organism ID, Bacteria STREPTOCOCCUS ANGINOSIS  Final      Susceptibility   Staphylococcus aureus - MIC*    CIPROFLOXACIN <=0.5 SENSITIVE Sensitive     ERYTHROMYCIN >=8 RESISTANT Resistant     GENTAMICIN <=0.5 SENSITIVE Sensitive     OXACILLIN <=0.25 SENSITIVE Sensitive     TETRACYCLINE <=1 SENSITIVE Sensitive     VANCOMYCIN 1 SENSITIVE Sensitive     TRIMETH/SULFA <=10 SENSITIVE Sensitive     CLINDAMYCIN RESISTANT Resistant     RIFAMPIN <=0.5 SENSITIVE Sensitive     Inducible Clindamycin POSITIVE Resistant     LINEZOLID 2 SENSITIVE Sensitive     * RARE STAPHYLOCOCCUS AUREUS   Streptococcus anginosis - MIC*    PENICILLIN <=0.06 SENSITIVE Sensitive     CEFTRIAXONE <=0.12 SENSITIVE Sensitive     ERYTHROMYCIN >=8 RESISTANT Resistant     LEVOFLOXACIN <=0.25 SENSITIVE Sensitive     VANCOMYCIN 0.5 SENSITIVE Sensitive     * FEW STREPTOCOCCUS ANGINOSIS    Radiology Reports  No results found.     Signature  -    Susa Raring M.D on 06/25/2023 at 9:06 AM   -  To page go to [email or web address]

## 2023-06-25 NOTE — Plan of Care (Signed)

## 2023-06-25 NOTE — Progress Notes (Signed)
 Rounding Note    Patient Name: Derek Thompson Date of Encounter: 06/25/2023  [facility] Cardiologist: [name], MD  Chief Complaint: Weakness Reason of consult: Evaluation of large LV apex thrombus  Subjective   NEOT No chest pain, Shortness of breath, orthopnea, PND.  +LE swelling  Inpatient Medications    Scheduled Meds:  amoxicillin-clavulanate  1 tablet Oral Q12H   apixaban  5 mg Oral BID   vitamin C  1,000 mg Oral Daily   atorvastatin  80 mg Oral Daily   Chlorhexidine Gluconate Cloth  6 each Topical Daily   digoxin  0.125 mg Oral Daily   feeding supplement  237 mL Oral BID BM   [START ON 06/26/2023] furosemide  20 mg Oral Daily   insulin aspart  0-15 Units Subcutaneous TID WC   insulin aspart  0-5 Units Subcutaneous QHS   insulin glargine-yfgn  15 Units Subcutaneous Daily   insulin starter kit- pen needles  1 kit Other Once   linezolid  600 mg Oral Q12H   living well with diabetes book   Does not apply Once   metoprolol succinate  50 mg Oral Daily   multivitamin with minerals  1 tablet Oral Daily   nutrition supplement (JUVEN)  1 packet Oral BID BM   pantoprazole  40 mg Oral Daily   sodium bicarbonate  650 mg Oral TID   sodium chloride flush  10-40 mL Intracatheter Q12H   tamsulosin  0.4 mg Oral Daily   zinc sulfate (50mg  elemental zinc)  220 mg Oral Daily   Continuous Infusions:  sodium chloride 10 mL/hr at 06/23/23 1504   PRN Meds: acetaminophen **OR** acetaminophen, guaiFENesin, ondansetron (ZOFRAN) IV, mouth rinse   Vital Signs    Vitals:   06/25/23 0529 06/25/23 0735 06/25/23 1221 06/25/23 1615  BP: (!) 107/59 108/64 118/70 118/71  Pulse: 79 86 91 91  Resp: 12 18 18 14   Temp: (!) 97.5 F (36.4 C) 97.7 F (36.5 C) 97.9 F (36.6 C) 97.8 F (36.6 C)  TempSrc: Oral Oral Oral Oral  SpO2: 98% 97% 99% 100%  Weight: 84.3 kg     Height:        Intake/Output Summary (Last 24 hours) at 06/25/2023 1853 Last data filed at 06/25/2023  1630 Gross per 24 hour  Intake --  Output 1400 ml  Net -1400 ml   Net IO Since Admission: -6,303.09 mL [06/25/23 1853]     06/25/2023    5:29 AM 06/24/2023    4:59 AM 06/23/2023    5:00 AM  Last 3 Weights  Weight (lbs) 185 lb 13.6 oz 183 lb 13.8 oz 186 lb 11.7 oz  Weight (kg) 84.3 kg 83.4 kg 84.7 kg      Telemetry    Sinus rhythm without ectopy- Personally Reviewed  ECG    06/23/2023: Sinus tachycardia, 133 bpm, left bundle branch block ST-T changes in inferolateral leads cannot rule out ischemia.  Similar findings on prior EKG dated 06/13/2023.- Personally Reviewed  Physical Exam   Physical Exam  Constitutional:  Age appropriate, hemodynamically stable, no acute distress.   Neck: No JVD present.  Left IJ central line  Cardiovascular: Normal rate, regular rhythm, S1 normal and S2 normal. Exam reveals decreased pulses. Exam reveals no gallop and no friction rub.  Murmur heard. Holosystolic murmur is present with a grade of 3/6 at the apex. Pulses:      Carotid pulses are  on the right side with bruit. Pulmonary/Chest: He  has no wheezes. He has bibasilar rales. He exhibits no tenderness.  Abdominal: Soft. Bowel sounds are normal. He exhibits no distension. There is no abdominal tenderness.  Musculoskeletal:        General: Edema (bilateral +1) present. No tenderness.     Comments: Bilateral lower extremity wounds on the phalanges.  Right lower extremity warm to touch postoperative dressing present.  Skin: Skin is warm and dry.    Labs    High Sensitivity Troponin:   Recent Labs  Lab 06/14/23 1405  TROPONINIHS 66*     Chemistry Recent Labs  Lab 06/19/23 0351 06/20/23 0355 06/21/23 0428 06/22/23 0520 06/23/23 0742 06/24/23 0621  NA 133* 135 133* 135 135 134*  K 4.4 4.2 4.0 4.5 3.9 4.1  CL 110 110 109 112* 112* 108  CO2 17* 16* 18* 17* 16* 19*  GLUCOSE 241* 154* 160* 81 118* 142*  BUN 23 20 20 18 19 23   CREATININE 1.28* 1.24 1.44* 1.42* 1.48* 1.47*  CALCIUM  7.6* 7.8* 7.9* 8.1* 7.8* 7.9*  MG 1.8 1.7 1.9  --  1.5* 2.2  PROT 5.6*  --   --   --   --  5.6*  ALBUMIN <1.5* 1.6*  --   --   --  <1.5*  AST 12*  --   --   --   --  17  ALT 13  --   --   --   --  10  ALKPHOS 37*  --   --   --   --  34*  BILITOT 0.6  --   --   --   --  0.6  GFRNONAA >60 >60 55* 56* 53* 53*  ANIONGAP 6 9 6 6 7 7     Lipids  No results for input(s): "CHOL", "TRIG", "HDL", "LABVLDL", "LDLCALC", "CHOLHDL" in the last 168 hours.   Hematology Recent Labs  Lab 06/22/23 0520 06/23/23 0742 06/24/23 0621  WBC 10.6* 14.1* 12.0*  RBC 3.90* 3.42* 3.49*  HGB 10.5* 9.3* 9.5*  HCT 32.7* 28.6* 30.0*  MCV 83.8 83.6 86.0  MCH 26.9 27.2 27.2  MCHC 32.1 32.5 31.7  RDW 20.2* 19.9* 20.2*  PLT 151 145* 142*   Thyroid  No results for input(s): "TSH", "FREET4" in the last 168 hours.   BNP Recent Labs  Lab 06/19/23 0351 06/23/23 0742  BNP 2,164.5* 4,255.9*    DDimer No results for input(s): "DDIMER" in the last 168 hours.   Radiology    NA  Cardiac Studies   Echo 06/14/2023  1. Large 3 x 3 cm round heterogenous mass at the LV apex which is  suggestive of thrombus given severe LV dysfunction and less likely myxoma  - the mass did not take up Definity contrast (appears avascular). Left  ventricular ejection fraction, by  estimation, is 20 to 25%. Left ventricular ejection fraction by 2D MOD  biplane is 21.2 %. The left ventricle has severely decreased function. The  left ventricle demonstrates global hypokinesis. The left ventricular  internal cavity size was mildly  dilated. Left ventricular diastolic parameters are consistent with Grade  II diastolic dysfunction (pseudonormalization). Elevated left ventricular  end-diastolic pressure.   2. Right ventricular systolic function is mildly reduced. The right  ventricular size is normal. There is mildly elevated pulmonary artery  systolic pressure. The estimated right ventricular systolic pressure is  39.0 mmHg.   3.  The mitral valve is abnormal. Moderate mitral valve regurgitation.   4. The aortic valve is tricuspid.  Aortic valve regurgitation is not  visualized.   5. The inferior vena cava is dilated in size with <50% respiratory  variability, suggesting right atrial pressure of 15 mmHg.   Comparison(s): Changes from prior study are noted. 08/16/2022: LVEF 25%,  global hypokinesis.   Conclusion(s)/Recommendation(s): Critical findings reported to Dr. Francine Graven  and Elmer Picker, NP and acknowledged at 12:23 pm on 06/14/23.   Patient Profile     63 y.o. 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 Francine Graven.   Assessment & Plan    Acute on Chronic HFrEF - 08/2022 echo: LVEF 25% - 10/2022 cath: nonobstrutive CAD, CI 2.87, PCWP 15, PA mean 19 - 06/2023 echo: LVEF 20-25%, grade II dd, mild RV dysfunction, mod MR Net IO Since Admission: -6,303.09 mL [06/25/23 1853] Repeat BNP - trends up, CXR note congestion, treatment transitioning IV Lasix to p.o. Continue aspirin 81 mg p.o. daily. Continue atorvastatin 80 mg p.o. daily. Digoxin levels are low - will increase digoxin 250 mcg p.o. daily. Recheck Dig levels in 3 days.  Continue Lasix 20 mg p.o. daily  Continue Toprol-XL 50 mg p.o. daily  Once renal function improves consider adding back ARB, MRA in stepwise fashion. Attempted to start Entresto 24/26 mg p.o. twice daily and Aldactone on 06/22/2023 with holding parameters parameters however, he became hypotensive and renal function worsened.   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 Transitioned to Eliquis 06/22/2023   Recommend repeating either an echocardiogram with contrast or cardiac MRI in 3 months after being on anticoagulation to make sure that the LV thrombus has resolved.  Septic shock secondary to osteomyelitis and right foot wet gangrene: Currently on antibiotics and patient underwent first and second ray amputation on Wednesday, 06/21/2023.  NSVT:  No recurrence of NSVT on telemetry - personally reviewed.  Transitioned to Toprol-XL as discussed above  Continue to monitor  Has undergone ischemic workup in the recent past-2024.  Given the fact that he is chest pain-free and recently had an acute/early subacute CVA no additional invasive cardiovascular testing warranted at this time.  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 Cardio embolic secondary to LV thrombus. Primary following.   IDDM: Poorly controlled diabetic, with hemoglobin A1c 13.3, currently on insulin  HLD: LDL at goal. Continue Lipitor 80 mg po at bedtime  HTN: Blood pressures are stable.  See recommendation above.   Diabetic right second toe infection.  Seen by vascular and orthopedic surgery  Underwent amputation on 06/21/2023  Non compliance w/ medical therapy.  Decided to stop insulin tx prior to arrival - per EMR.      For questions or updates, please contact [name] Please consult [email or web address] for contact info under     Signed, Tessa Lerner, DO, [facility]  [facility]  [address] [place], [name] [address] Pager: [phone] Office: [phone] 06/25/2023, 6:53 PM

## 2023-06-26 DIAGNOSIS — I5022 Chronic systolic (congestive) heart failure: Secondary | ICD-10-CM | POA: Diagnosis not present

## 2023-06-26 DIAGNOSIS — I240 Acute coronary thrombosis not resulting in myocardial infarction: Secondary | ICD-10-CM | POA: Diagnosis not present

## 2023-06-26 DIAGNOSIS — I5021 Acute systolic (congestive) heart failure: Secondary | ICD-10-CM | POA: Diagnosis not present

## 2023-06-26 DIAGNOSIS — I4729 Other ventricular tachycardia: Secondary | ICD-10-CM | POA: Diagnosis not present

## 2023-06-26 DIAGNOSIS — I639 Cerebral infarction, unspecified: Secondary | ICD-10-CM | POA: Diagnosis not present

## 2023-06-26 LAB — BASIC METABOLIC PANEL
Anion gap: 7 (ref 5–15)
BUN: 18 mg/dL (ref 8–23)
CO2: 23 mmol/L (ref 22–32)
Calcium: 8.3 mg/dL — ABNORMAL LOW (ref 8.9–10.3)
Chloride: 107 mmol/L (ref 98–111)
Creatinine, Ser: 1.31 mg/dL — ABNORMAL HIGH (ref 0.61–1.24)
GFR, Estimated: >60 mL/min (ref >60)
Glucose, Bld: 124 mg/dL — ABNORMAL HIGH (ref 70–99)
Potassium: 4.3 mmol/L (ref 3.5–5.1)
Sodium: 137 mmol/L (ref 135–145)

## 2023-06-26 LAB — CBC WITH DIFFERENTIAL/PLATELET
Abs Immature Granulocytes: 0.03 10*3/uL (ref 0.00–0.07)
Basophils Absolute: 0 10*3/uL (ref 0.0–0.1)
Basophils Relative: 1 %
Eosinophils Absolute: 0 10*3/uL (ref 0.0–0.5)
Eosinophils Relative: 1 %
HCT: 32.4 % — ABNORMAL LOW (ref 39.0–52.0)
Hemoglobin: 10.3 g/dL — ABNORMAL LOW (ref 13.0–17.0)
Immature Granulocytes: 0 %
Lymphocytes Relative: 20 %
Lymphs Abs: 1.5 10*3/uL (ref 0.7–4.0)
MCH: 27.6 pg (ref 26.0–34.0)
MCHC: 31.8 g/dL (ref 30.0–36.0)
MCV: 86.9 fL (ref 80.0–100.0)
Monocytes Absolute: 0.6 10*3/uL (ref 0.1–1.0)
Monocytes Relative: 7 %
Neutro Abs: 5.4 10*3/uL (ref 1.7–7.7)
Neutrophils Relative %: 71 %
Platelets: 218 10*3/uL (ref 150–400)
RBC: 3.73 MIL/uL — ABNORMAL LOW (ref 4.22–5.81)
RDW: 20.6 % — ABNORMAL HIGH (ref 11.5–15.5)
WBC: 7.5 10*3/uL (ref 4.0–10.5)
nRBC: 0 % (ref 0.0–0.2)

## 2023-06-26 LAB — BRAIN NATRIURETIC PEPTIDE: B Natriuretic Peptide: 3071.4 pg/mL — ABNORMAL HIGH (ref 0.0–100.0)

## 2023-06-26 LAB — PROCALCITONIN: Procalcitonin: 13.18 ng/mL

## 2023-06-26 LAB — GLUCOSE, CAPILLARY
Glucose-Capillary: 152 mg/dL — ABNORMAL HIGH (ref 70–99)
Glucose-Capillary: 197 mg/dL — ABNORMAL HIGH (ref 70–99)
Glucose-Capillary: 168 mg/dL — ABNORMAL HIGH (ref 70–99)
Glucose-Capillary: 178 mg/dL — ABNORMAL HIGH (ref 70–99)

## 2023-06-26 LAB — C-REACTIVE PROTEIN: CRP: 2.6 mg/dL — ABNORMAL HIGH (ref <1.0)

## 2023-06-26 LAB — MAGNESIUM: Magnesium: 1.9 mg/dL (ref 1.7–2.4)

## 2023-06-26 MED ORDER — INSULIN GLARGINE 100 UNIT/ML ~~LOC~~ SOLN
15.0000 [IU] | Freq: Every day | SUBCUTANEOUS | Status: DC
  Administered 2023-06-27 – 2023-06-28 (×2): 15 [IU] via SUBCUTANEOUS
  Filled 2023-06-26 (×2): qty 0.15

## 2023-06-26 MED ORDER — INSULIN STARTER KIT- PEN NEEDLES (ENGLISH)
1.0000 | Freq: Once | Status: DC
  Filled 2023-06-26: qty 1

## 2023-06-26 MED ORDER — METOPROLOL SUCCINATE ER 25 MG PO TB24
25.0000 mg | ORAL_TABLET | Freq: Every day | ORAL | Status: DC
  Administered 2023-06-26 – 2023-06-28 (×3): 25 mg via ORAL
  Filled 2023-06-26 (×3): qty 1

## 2023-06-26 MED ORDER — LOPERAMIDE HCL 2 MG PO CAPS
4.0000 mg | ORAL_CAPSULE | Freq: Four times a day (QID) | ORAL | Status: DC | PRN
  Administered 2023-06-27: 4 mg via ORAL
  Filled 2023-06-26: qty 2

## 2023-06-26 MED ORDER — FUROSEMIDE 20 MG PO TABS
20.0000 mg | ORAL_TABLET | Freq: Every day | ORAL | Status: DC
  Administered 2023-06-27 – 2023-06-28 (×2): 20 mg via ORAL
  Filled 2023-06-26 (×2): qty 1

## 2023-06-26 MED ORDER — LIVING WELL WITH DIABETES BOOK
Freq: Once | Status: DC
  Filled 2023-06-26: qty 1

## 2023-06-26 NOTE — Plan of Care (Signed)
 Plan of care is reviewed. Pt has been progressing overall. He is afebrile, stable hemodynamically, no complaints, no acute distress overnight.  Problem: Clinical Measurements: Goal: Ability to maintain clinical measurements within normal limits will improve Outcome: Progressing Goal: Will remain free from infection Outcome: Progressing Goal: Diagnostic test results will improve Outcome: Progressing Goal: Respiratory complications will improve Outcome: Progressing Goal: Cardiovascular complication will be avoided Outcome: Progressing   Problem: Health Behavior/Discharge Planning: Goal: Ability to manage health-related needs will improve Outcome: Progressing   Problem: Activity: Goal: Risk for activity intolerance will decrease Outcome: Progressing   Problem: Pain Managment: Goal: General experience of comfort will improve and/or be controlled Outcome: Progressing   Problem: Skin Integrity: Goal: Risk for impaired skin integrity will decrease Outcome: Progressing   Problem: Metabolic: Goal: Ability to maintain appropriate glucose levels will improve Outcome: Progressing   Problem: Tissue Perfusion: Goal: Adequacy of tissue perfusion will improve Outcome: Progressing   Problem: Ischemic Stroke/TIA Tissue Perfusion: Goal: Complications of ischemic stroke/TIA will be minimized Outcome: Progressing   Problem: Health Behavior/Discharge Planning: Goal: Ability to manage health-related needs will improve Outcome: Progressing Goal: Goals will be collaboratively established with patient/family Outcome: Progressing   Problem: Self-Care: Goal: Ability to participate in self-care as condition permits will improve Outcome: Progressing Goal: Verbalization of feelings and concerns over difficulty with self-care will improve Outcome: Progressing Goal: Ability to communicate needs accurately will improve Outcome: Progressing   Radene Knee, RN

## 2023-06-26 NOTE — Progress Notes (Signed)
 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 PMH: poorly controlled DM2, chronic diastolic CHF, chronic R sided heart failure, anemia of chronic disease, L bundle branch block    PT Comments  Pt with fair tolerance to treatment today. Pt once again unable to adhere to RLE WB precautions. Once standing pt had large BM on the floor requiring total A for pericare. Pt transferred to [facility] then to chair with +2 Min A. No change in DC/DME recs at this time. PT will continue to follow.      If plan is discharge home, recommend the following: A little help with walking and/or transfers;A lot of help with bathing/dressing/bathroom;Assistance with cooking/housework;Help with stairs or ramp for entrance;Assist for transportation   Can travel by private vehicle     No  Equipment Recommendations  Rolling walker (2 wheels);Wheelchair (measurements PT);Wheelchair cushion (measurements PT);Hospital bed    Recommendations for Other Services       Precautions / Restrictions Precautions Precautions: Fall Recall of Precautions/Restrictions: Impaired Precaution/Restrictions Comments: Incontinent of bowels Restrictions Weight Bearing Restrictions Per Provider Order: Yes RLE Weight Bearing Per Provider Order: Touchdown weight bearing Other Position/Activity Restrictions: wear post op shoe/boot when performing functional mobility     Mobility  Bed Mobility Overal bed mobility: Needs Assistance Bed Mobility: Supine to Sit     Supine to sit: Min assist     General bed mobility comments: increased time to initiate task. verbal cues for sequencing. Min A to scoot hips forward.    Transfers Overall  transfer level: Needs assistance Equipment used: Rolling walker (2 wheels) Transfers: Sit to/from Stand, Bed to chair/wheelchair/BSC Sit to Stand: Min assist, +2 safety/equipment   Step pivot transfers: +2 safety/equipment, Min assist       General transfer comment: Once again unable to adhere to RLE WB precautions. Once standing pt had large BM on the floor requiring total A for pericare. Pt transferred to [facility] then to chair with +2 Min A.    Ambulation/Gait               General Gait Details: Deferred due to poor adherence of WB precautions and bowel incontinence.   Stairs             Wheelchair Mobility     Tilt Bed    Modified Rankin (Stroke Patients Only)       Balance Overall balance assessment: Needs assistance Sitting-balance support: Bilateral upper extremity supported, Feet supported Sitting balance-Leahy Scale: Fair Sitting balance - Comments: static sitting EOB   Standing balance support: Bilateral upper extremity supported, During functional activity, Reliant on assistive device for balance Standing balance-Leahy Scale: Poor Standing balance comment: Reliant on RW for support. Difficulty maintaining balance when adhering to RLE WB precautions                            Communication Communication Communication: Impaired Factors Affecting Communication: Reduced clarity of speech  Cognition Arousal: Alert Behavior During Therapy: Flat affect   PT - Cognitive impairments: Problem solving                       PT - Cognition Comments:  pt able to follow commands however noted with slow processing. Very poor recall and adherence to WB precautions. Following commands: Impaired Following commands impaired: Follows one step commands inconsistently    Cueing Cueing Techniques: Verbal cues, Tactile cues  Exercises      General Comments General comments (skin integrity, edema, etc.): VSS on RA.      Pertinent Vitals/Pain  Pain Assessment Pain Assessment: No/denies pain    Home Living                          Prior Function            PT Goals (current goals can now be found in the care plan section) Progress towards PT goals: Progressing toward goals    Frequency    Min 1X/week      PT Plan      Co-evaluation              AM-PAC PT "6 Clicks" Mobility   Outcome Measure  Help needed turning from your back to your side while in a flat bed without using bedrails?: A Little Help needed moving from lying on your back to sitting on the side of a flat bed without using bedrails?: A Lot Help needed moving to and from a bed to a chair (including a wheelchair)?: A Lot Help needed standing up from a chair using your arms (e.g., wheelchair or bedside chair)?: A Lot Help needed to walk in hospital room?: Total Help needed climbing 3-5 steps with a railing? : Total 6 Click Score: 11    End of Session Equipment Utilized During Treatment: Gait belt Activity Tolerance: Other (comment) (limited by bowel incontinence) Patient left: in bed;with call bell/phone within reach;with bed alarm set;with family/visitor present Nurse Communication: Mobility status PT Visit Diagnosis: Unsteadiness on feet (R26.81);Muscle weakness (generalized) (M62.81);Difficulty in walking, not elsewhere classified (R26.2)     Time: 4401-0272 PT Time Calculation (min) (ACUTE ONLY): 28 min  Charges:    $Therapeutic Activity: 23-37 mins PT General Charges $$ ACUTE PT VISIT: 1 Visit                     Shela Nevin, PT, DPT Acute Rehab Services [phone]    [name] 06/26/2023, 3:53 PM

## 2023-06-26 NOTE — Progress Notes (Signed)
 Rounding Note    Patient Name: Derek Thompson Date of Encounter: 06/26/2023  [facility] Cardiologist: [name], MD   Subjective   He says he feels okay.  Family is by the bedside and hoping he can be discharged soon  Inpatient Medications    Scheduled Meds:  amoxicillin-clavulanate  1 tablet Oral Q12H   apixaban  5 mg Oral BID   vitamin C  1,000 mg Oral Daily   atorvastatin  80 mg Oral Daily   Chlorhexidine Gluconate Cloth  6 each Topical Daily   digoxin  0.25 mg Oral Daily   feeding supplement  237 mL Oral BID BM   [START ON 06/27/2023] furosemide  20 mg Oral Daily   insulin aspart  0-15 Units Subcutaneous TID WC   insulin aspart  0-5 Units Subcutaneous QHS   insulin glargine-yfgn  15 Units Subcutaneous Daily   insulin starter kit- pen needles  1 kit Other Once   living well with diabetes book   Does not apply Once   metoprolol succinate  25 mg Oral Daily   multivitamin with minerals  1 tablet Oral Daily   nutrition supplement (JUVEN)  1 packet Oral BID BM   pantoprazole  40 mg Oral Daily   sodium bicarbonate  650 mg Oral TID   sodium chloride flush  10-40 mL Intracatheter Q12H   tamsulosin  0.4 mg Oral Daily   zinc sulfate (50mg  elemental zinc)  220 mg Oral Daily   Continuous Infusions:  sodium chloride 10 mL/hr at 06/23/23 1504   PRN Meds: acetaminophen **OR** acetaminophen, guaiFENesin, ondansetron (ZOFRAN) IV, mouth rinse   Vital Signs    Vitals:   06/26/23 0358 06/26/23 0400 06/26/23 0500 06/26/23 0818  BP: 108/70 108/70  118/70  Pulse: 90 92  97  Resp: 14 16  15   Temp:  (!) 97.5 F (36.4 C)  98.2 F (36.8 C)  TempSrc:  Oral  Oral  SpO2: 96% 96%  96%  Weight:   80.2 kg   Height:        Intake/Output Summary (Last 24 hours) at 06/26/2023 1016 Last data filed at 06/26/2023 0800 Gross per 24 hour  Intake 490 ml  Output 1000 ml  Net -510 ml      06/26/2023    5:00 AM 06/25/2023    5:29 AM 06/24/2023    4:59 AM  Last 3 Weights   Weight (lbs) 176 lb 12.9 oz 185 lb 13.6 oz 183 lb 13.8 oz  Weight (kg) 80.2 kg 84.3 kg 83.4 kg      Telemetry    Sinus rhythm with infrequent PVCs- Personally Reviewed  ECG    No new- Personally Reviewed  Physical Exam   Vitals:   06/26/23 0400 06/26/23 0818  BP: 108/70 118/70  Pulse: 92 97  Resp: 16 15  Temp: (!) 97.5 F (36.4 C) 98.2 F (36.8 C)  SpO2: 96% 96%    GEN: No acute distress.   Neck: No JVD Cardiac: RRR, no murmurs, rubs, or gallops.  Respiratory: Clear to auscultation bilaterally. GI: Soft, nontender, non-distended  MS: No edema; No deformity. Neuro:  Nonfocal  Psych: Normal affect   Labs    High Sensitivity Troponin:   Recent Labs  Lab 06/14/23 1405  TROPONINIHS 66*     Chemistry Recent Labs  Lab 06/20/23 0355 06/21/23 0428 06/23/23 0742 06/24/23 0621 06/26/23 0521  NA 135   < > 135 134* 137  K 4.2   < >  3.9 4.1 4.3  CL 110   < > 112* 108 107  CO2 16*   < > 16* 19* 23  GLUCOSE 154*   < > 118* 142* 124*  BUN 20   < > 19 23 18   CREATININE 1.24   < > 1.48* 1.47* 1.31*  CALCIUM 7.8*   < > 7.8* 7.9* 8.3*  MG 1.7   < > 1.5* 2.2 1.9  PROT  --   --   --  5.6*  --   ALBUMIN 1.6*  --   --  <1.5*  --   AST  --   --   --  17  --   ALT  --   --   --  10  --   ALKPHOS  --   --   --  34*  --   BILITOT  --   --   --  0.6  --   GFRNONAA >60   < > 53* 53* >60  ANIONGAP 9   < > 7 7 7    < > = values in this interval not displayed.    Lipids No results for input(s): "CHOL", "TRIG", "HDL", "LABVLDL", "LDLCALC", "CHOLHDL" in the last 168 hours.  Hematology Recent Labs  Lab 06/23/23 0742 06/24/23 0621 06/26/23 0521  WBC 14.1* 12.0* 7.5  RBC 3.42* 3.49* 3.73*  HGB 9.3* 9.5* 10.3*  HCT 28.6* 30.0* 32.4*  MCV 83.6 86.0 86.9  MCH 27.2 27.2 27.6  MCHC 32.5 31.7 31.8  RDW 19.9* 20.2* 20.6*  PLT 145* 142* 218   Thyroid No results for input(s): "TSH", "FREET4" in the last 168 hours.  BNP Recent Labs  Lab 06/23/23 0742 06/26/23 0521  BNP  4,255.9* 3,071.4*    DDimer No results for input(s): "DDIMER" in the last 168 hours.   Radiology    No results found.  Cardiac Studies   Echo 06/14/2023  1. Large 3 x 3 cm round heterogenous mass at the LV apex which is  suggestive of thrombus given severe LV dysfunction and less likely myxoma  - the mass did not take up Definity contrast (appears avascular). Left  ventricular ejection fraction, by  estimation, is 20 to 25%. Left ventricular ejection fraction by 2D MOD  biplane is 21.2 %. The left ventricle has severely decreased function. The  left ventricle demonstrates global hypokinesis. The left ventricular  internal cavity size was mildly  dilated. Left ventricular diastolic parameters are consistent with Grade  II diastolic dysfunction (pseudonormalization). Elevated left ventricular  end-diastolic pressure.   2. Right ventricular systolic function is mildly reduced. The right  ventricular size is normal. There is mildly elevated pulmonary artery  systolic pressure. The estimated right ventricular systolic pressure is  39.0 mmHg.   3. The mitral valve is abnormal. Moderate mitral valve regurgitation.   4. The aortic valve is tricuspid. Aortic valve regurgitation is not  visualized.   5. The inferior vena cava is dilated in size with <50% respiratory  variability, suggesting right atrial pressure of 15 mmHg.   Comparison(s): Changes from prior study are noted. 08/16/2022: LVEF 25%,  global hypokinesis.   Patient Profile     63 y.o. male hx of poorly controlled type 2 diabetes,chronic combined CHF (EF 20-25%, grade II diastolic dysfunction), CAD, HTN, history of prostate cancer who is being seen 06/14/2023 for the evaluation of large LV apex thrombus at the request of Dr. Francine Graven.  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  -Euvolemic -Continue Lasix 20 mg daily -He was placed on digoxin however with no arrhythmia or RV failure; will plan to DC this considering difficulty with compliance per below -Continue metoprolol XL 25 mg daily -Will hold off on uptitrating GDMT in the setting of recent sepsis.  No plans for SGLT2 with ongoing infection  Large LV thrombus; 3 x 3 cm Identified in this admission in the setting of apical akinesis -Continue Eliquis -As an outpatient can repeat echo or CMR in 3 months to assess for resolution  HLD -Continue Lipitor 80 mg daily  Poorly controlled DM2 A1c 13 -Per primary team  He is on all oral medication from a cardiology perspective.  From a cardiology perspective he does not need to remain inpatient.  For questions or updates, please contact [facility] Please consult [email or web address] for contact info under        Signed, Maisie Fus, MD  06/26/2023, 10:16 AM

## 2023-06-26 NOTE — Plan of Care (Signed)
 Problem: Education: Goal: Knowledge of General Education information will improve Description: Including pain rating scale, medication(s)/side effects and non-pharmacologic comfort measures Outcome: Progressing   Problem: Health Behavior/Discharge Planning: Goal: Ability to manage health-related needs will improve Outcome: Progressing   Problem: Clinical Measurements: Goal: Ability to maintain clinical measurements within normal limits will improve Outcome: Progressing Goal: Will remain free from infection Outcome: Progressing Goal: Diagnostic test results will improve Outcome: Progressing Goal: Respiratory complications will improve Outcome: Progressing Goal: Cardiovascular complication will be avoided Outcome: Progressing   Problem: Activity: Goal: Risk for activity intolerance will decrease Outcome: Progressing   Problem: Nutrition: Goal: Adequate nutrition will be maintained Outcome: Progressing   Problem: Coping: Goal: Level of anxiety will decrease Outcome: Progressing   Problem: Elimination: Goal: Will not experience complications related to bowel motility Outcome: Progressing Goal: Will not experience complications related to urinary retention Outcome: Progressing   Problem: Pain Managment: Goal: General experience of comfort will improve and/or be controlled Outcome: Progressing   Problem: Safety: Goal: Ability to remain free from injury will improve Outcome: Progressing   Problem: Skin Integrity: Goal: Risk for impaired skin integrity will decrease Outcome: Progressing   Problem: Education: Goal: Ability to describe self-care measures that may prevent or decrease complications (Diabetes Survival Skills Education) will improve Outcome: Progressing Goal: Individualized Educational Video(s) Outcome: Progressing   Problem: Coping: Goal: Ability to adjust to condition or change in health will improve Outcome: Progressing   Problem: Fluid  Volume: Goal: Ability to maintain a balanced intake and output will improve Outcome: Progressing   Problem: Health Behavior/Discharge Planning: Goal: Ability to identify and utilize available resources and services will improve Outcome: Progressing Goal: Ability to manage health-related needs will improve Outcome: Progressing   Problem: Metabolic: Goal: Ability to maintain appropriate glucose levels will improve Outcome: Progressing   Problem: Nutritional: Goal: Maintenance of adequate nutrition will improve Outcome: Progressing Goal: Progress toward achieving an optimal weight will improve Outcome: Progressing   Problem: Skin Integrity: Goal: Risk for impaired skin integrity will decrease Outcome: Progressing   Problem: Tissue Perfusion: Goal: Adequacy of tissue perfusion will improve Outcome: Progressing   Problem: Education: Goal: Knowledge of disease or condition will improve Outcome: Progressing Goal: Knowledge of secondary prevention will improve (MUST DOCUMENT ALL) Outcome: Progressing Goal: Knowledge of patient specific risk factors will improve (DELETE if not current risk factor) Outcome: Progressing   Problem: Ischemic Stroke/TIA Tissue Perfusion: Goal: Complications of ischemic stroke/TIA will be minimized Outcome: Progressing   Problem: Coping: Goal: Will verbalize positive feelings about self Outcome: Progressing Goal: Will identify appropriate support needs Outcome: Progressing   Problem: Health Behavior/Discharge Planning: Goal: Ability to manage health-related needs will improve Outcome: Progressing Goal: Goals will be collaboratively established with patient/family Outcome: Progressing   Problem: Self-Care: Goal: Ability to participate in self-care as condition permits will improve Outcome: Progressing Goal: Verbalization of feelings and concerns over difficulty with self-care will improve Outcome: Progressing Goal: Ability to communicate  needs accurately will improve Outcome: Progressing   Problem: Nutrition: Goal: Risk of aspiration will decrease Outcome: Progressing Goal: Dietary intake will improve Outcome: Progressing   Problem: Clinical Measurements: Goal: Ability to avoid or minimize complications of infection will improve Outcome: Progressing   Problem: Skin Integrity: Goal: Skin integrity will improve Outcome: Progressing   Problem: Education: Goal: Knowledge of the prescribed therapeutic regimen will improve Outcome: Progressing Goal: Ability to verbalize activity precautions or restrictions will improve Outcome: Progressing Goal: Understanding of discharge needs will improve Outcome: Progressing   Problem:  Activity: Goal: Ability to perform//tolerate increased activity and mobilize with assistive devices will improve Outcome: Progressing   Problem: Clinical Measurements: Goal: Postoperative complications will be avoided or minimized Outcome: Progressing

## 2023-06-26 NOTE — Progress Notes (Signed)
 PROGRESS NOTE        PATIENT DETAILS Name: Derek Thompson Age: 63 y.o. Sex: male Date of Birth: 03/31/1961 Admit Date: 06/13/2023 Admitting Physician Angie Fava, DO [id number], Casimiro Needle, DO  Brief Summary: Patient is a 63 y.o.  male with history of chronic HFrEF, DM-2, HTN, prostate cancer-s/p radiation 2022-who presented with generalized weakness/acute metabolic encephalopathy-he was found to have HHS secondary to right diabetic foot (great/second toe) with gangrene/osteomyelitis, LV thrombus and acute CVA.  He was stabilized in the ICU-and subsequently transferred to [facility] on 2/16.  Significant events: 2/12>> admit to [facility]-HHS/encephalopathy/diabetic foot-developed hypotension-started on Levophed-transfer to ICU. 2/14>> RLE angiogram-small vessel disease. 2/16>> transferred to [facility].  Significant studies: 2/11>> A1c: 13.3 2/12>> MRI brain: Acute infarct right frontal lobe, additional small infarct right cerebellum, left frontal/parietal/occipital lobe. 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 2/12>> renal ultrasound: No hydronephrosis 2/12>> echo: EF 20-25%, LV thrombus.  Grade 2 diastolic dysfunction.  Moderate mitral valve regurgitation 2/13>> LDL: 49.  Significant microbiology data: 2/11>> COVID/influenza/RSV PCR: Negative 2/12>> blood culture: Negative. 2/19>> intraoperative cultures/right foot: Rare Staph aureus  Procedures: 2/14>> RLE angiogram by vascular surgery-small vessel disease. 2/19>> right first and second ray amputation.  Consults: PCCM Orthopedics Cardiology Neurology  Subjective: Patient in bed, appears comfortable, denies any headache, no fever, no chest pain or pressure, no shortness of breath , no abdominal pain. No focal weakness.  Objective: Vitals: Blood pressure 108/70, pulse 92, temperature (!) 97.5 F (36.4 C), temperature source Oral, resp. rate  16, height 6' (1.829 m), weight 80.2 kg, SpO2 96%.   Exam:  Awake Alert, No new F.N deficits, Normal affect [place].AT,PERRAL Supple Neck, No JVD,   Symmetrical Chest wall movement, Good air movement bilaterally, CTAB RRR,No Gallops, Rubs or new Murmurs,  +ve B.Sounds, Abd Soft, No tenderness,   Right foot under bandage s/p ray amputation  Assessment/Plan:  Septic shock secondary to right diabetic foot with wet gangrene/osteomyelitis involving great/second toe-s/p first and second ray amputation on 2/19 Sepsis physiology has improved-however slight worsening leukocytosis today Remains on Zyvox-Augmentin, stop date 07/10/2023. Final culture results from the intraoperative specimen, will require 3 weeks of antibiotics based on the results.  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  Acute on chronic HFrEF Slightly more short of breath today-has worsening bilateral lower extremity edema-blood pressure soft Holding Lasix for a day due to AKI on 06/24/2023 Hold Entresto/Aldactone as blood pressure is low and has renal dysfunction Blood pressure soft on low-dose beta-blocker and digoxin for now  LV thrombus Initially on heparin drip now on Eliquis.  Seen by cardiology  AKI Likely hemodynamically mediated Hold Entresto and Aldactone, low dose Lasix with caution, monitor bladder scans to rule out obstruction, continue alpha-blocker for underlying BPH  HTN BP soft Holding ARNI/Aldactone-continue low-dose metoprolol with holding parameters.  Normocytic anemia Secondary to critical illness No evidence of blood loss S/p 1 unit of PRBC on 2/17-Hb currently  stable.  Thrombocytopenia Likely due to sepsis physiology Slowly improving Follow.  Sick euthyroid syndrome TSH/free T4  mildly elevated Repeat thyroid function test in 4 to 6 weeks when he has recovered from acute illness.  DM-2 with poor control/uncontrolled hyperglycemia (A1c 13.3 on 2/11) CBGs stable Semglee 15 units+ SSI Follow/optimize.  Recent Labs    06/25/23 1639 06/25/23 1933 06/25/23 2141  GLUCAP 198* 288* 226*      BMI: Estimated body mass index is 23.98 kg/m as calculated from the following:   Height as of this encounter: 6' (1.829 m).   Weight as of this encounter: 80.2 kg.   Code status:   Code Status: Full Code   DVT Prophylaxis: SCD's Start: 06/21/23 1157 SCDs Start: 06/13/23 2205 apixaban (ELIQUIS) tablet 5 mg    Family Communication: Mother and 2 sons on 2/20   Disposition Plan: Status is: Inpatient Remains inpatient appropriate because: Severity of illness   Planned Discharge Destination:Skilled nursing facility   Diet: Diet Order             Diet Carb Modified Fluid consistency: Thin; Room service appropriate? No  Diet effective now                    MEDICATIONS: Scheduled Meds:  amoxicillin-clavulanate  1 tablet Oral Q12H   apixaban  5 mg Oral BID   vitamin C  1,000 mg Oral Daily   atorvastatin  80 mg Oral Daily   Chlorhexidine Gluconate Cloth  6 each Topical Daily   digoxin  0.25 mg Oral Daily   feeding supplement  237 mL Oral BID BM   furosemide  20 mg Oral Daily   insulin aspart  0-15 Units Subcutaneous TID WC   insulin aspart  0-5 Units Subcutaneous QHS   insulin glargine-yfgn  15 Units Subcutaneous Daily   insulin starter kit- pen needles  1 kit Other Once   linezolid  600 mg Oral Q12H   living well with diabetes book   Does not apply Once   metoprolol succinate  50 mg Oral Daily   multivitamin with minerals  1 tablet Oral Daily   nutrition supplement (JUVEN)  1 packet Oral BID BM   pantoprazole  40 mg Oral  Daily   sodium bicarbonate  650 mg Oral TID   sodium chloride flush  10-40 mL Intracatheter Q12H   tamsulosin  0.4 mg Oral Daily   zinc sulfate (50mg  elemental zinc)  220 mg Oral Daily   Continuous Infusions:  sodium chloride 10 mL/hr at 06/23/23 1504   PRN Meds:.acetaminophen **OR** acetaminophen, guaiFENesin, ondansetron (ZOFRAN) IV, mouth rinse   I have personally reviewed following labs and imaging studies  LABORATORY DATA:   Data Review:   Inpatient Medications  Scheduled Meds:  amoxicillin-clavulanate  1 tablet Oral Q12H   apixaban  5 mg Oral BID   vitamin C  1,000 mg Oral Daily   atorvastatin  80 mg Oral Daily   Chlorhexidine Gluconate Cloth  6 each Topical Daily   digoxin  0.25 mg Oral Daily   feeding supplement  237 mL Oral BID BM   furosemide  20 mg Oral Daily   insulin aspart  0-15 Units Subcutaneous TID WC   insulin aspart  0-5 Units Subcutaneous QHS   insulin glargine-yfgn  15 Units Subcutaneous Daily   insulin starter kit- pen needles  1 kit Other Once   linezolid  600 mg Oral Q12H   living well with diabetes book   Does not apply Once   metoprolol succinate  50 mg Oral Daily   multivitamin with minerals  1 tablet Oral Daily   nutrition supplement (JUVEN)  1 packet Oral BID BM   pantoprazole  40 mg Oral Daily   sodium bicarbonate  650 mg Oral TID   sodium chloride flush  10-40 mL Intracatheter Q12H   tamsulosin  0.4 mg Oral Daily   zinc sulfate (50mg  elemental zinc)  220 mg Oral Daily   Continuous Infusions:  sodium chloride 10 mL/hr at 06/23/23 1504   PRN Meds:.acetaminophen **OR** acetaminophen, guaiFENesin, ondansetron (ZOFRAN) IV, mouth rinse  DVT Prophylaxis  SCD's Start: 06/21/23 1157 SCDs Start: 06/13/23 2205 apixaban (ELIQUIS) tablet 5 mg   Recent Labs  Lab 06/21/23 0428 06/22/23 0520 06/23/23 0742 06/24/23 0621 06/26/23 0521  WBC 6.7 10.6* 14.1* 12.0* 7.5  HGB 10.0* 10.5* 9.3* 9.5* 10.3*  HCT 31.3* 32.7* 28.6* 30.0* 32.4*  PLT  129* 151 145* 142* 218  MCV 83.2 83.8 83.6 86.0 86.9  MCH 26.6 26.9 27.2 27.2 27.6  MCHC 31.9 32.1 32.5 31.7 31.8  RDW 20.0* 20.2* 19.9* 20.2* 20.6*  LYMPHSABS  --   --   --  1.0 1.5  MONOABS  --   --   --  0.9 0.6  EOSABS  --   --   --  0.1 0.0  BASOSABS  --   --   --  0.0 0.0    Recent Labs  Lab 06/20/23 0355 06/21/23 0428 06/22/23 0520 06/23/23 0742 06/24/23 0621 06/26/23 0521  NA 135 133* 135 135 134* 137  K 4.2 4.0 4.5 3.9 4.1 4.3  CL 110 109 112* 112* 108 107  CO2 16* 18* 17* 16* 19* 23  ANIONGAP 9 6 6 7 7 7   GLUCOSE 154* 160* 81 118* 142* 124*  BUN 20 20 18 19 23 18   CREATININE 1.24 1.44* 1.42* 1.48* 1.47* 1.31*  AST  --   --   --   --  17  --   ALT  --   --   --   --  10  --   ALKPHOS  --   --   --   --  34*  --   BILITOT  --   --   --   --  0.6  --   ALBUMIN 1.6*  --   --   --  <1.5*  --   CRP  --   --   --   --   --  2.6*  PROCALCITON  --   --   --   --  35.94 13.18  BNP  --   --   --  4,255.9*  --  3,071.4*  MG 1.7 1.9  --  1.5* 2.2 1.9  PHOS 2.1* 2.7  --   --   --   --   CALCIUM 7.8* 7.9* 8.1* 7.8* 7.9* 8.3*      Recent Labs  Lab 06/20/23 0355 06/21/23 0428 06/22/23 0520 06/23/23 0742 06/24/23 0621 06/26/23 0521  CRP  --   --   --   --   --  2.6*  PROCALCITON  --   --   --   --  35.94 13.18  BNP  --   --   --  4,255.9*  --  3,071.4*  MG 1.7 1.9  --  1.5* 2.2 1.9  CALCIUM 7.8* 7.9* 8.1* 7.8* 7.9* 8.3*    --------------------------------------------------------------------------------------------------------------- Lab Results  Component Value Date   CHOL 85 06/15/2023   HDL 10 (L) 06/15/2023   LDLCALC 49 06/15/2023  TRIG 131 06/15/2023   CHOLHDL 8.5 06/15/2023    Lab Results  Component Value Date   HGBA1C 13.3 (H) 06/13/2023   No results for input(s): "TSH", "T4TOTAL", "FREET4", "T3FREE", "THYROIDAB" in the last 72 hours. No results for input(s): "VITAMINB12", "FOLATE", "FERRITIN", "TIBC", "IRON", "RETICCTPCT" in the last 72  hours. ------------------------------------------------------------------------------------------------------------------ Cardiac Enzymes No results for input(s): "CKMB", "TROPONINI", "MYOGLOBIN" in the last 168 hours.  Invalid input(s): "CK"  Micro Results Recent Results (from the past 240 hours)  Aerobic/Anaerobic Culture w Gram Stain (surgical/deep wound)     Status: None   Collection Time: 06/21/23 10:32 AM   Specimen: Soft Tissue, Other  Result Value Ref Range Status   Specimen Description TISSUE  Final   Special Requests SOFT TISSUE  Final   Gram Stain   Final    RARE WBC PRESENT, PREDOMINANTLY PMN RARE GRAM POSITIVE COCCI IN PAIRS    Culture   Final    RARE STAPHYLOCOCCUS AUREUS FEW STREPTOCOCCUS ANGINOSIS RARE BACTEROIDES FRAGILIS RARE BACTEROIDES THETAIOTAOMICRON BETA LACTAMASE POSITIVE Performed at [facility], [address] [place], [name] [address]    Report Status 06/24/2023 FINAL  Final   Organism ID, Bacteria STAPHYLOCOCCUS AUREUS  Final   Organism ID, Bacteria STREPTOCOCCUS ANGINOSIS  Final      Susceptibility   Staphylococcus aureus - MIC*    CIPROFLOXACIN <=0.5 SENSITIVE Sensitive     ERYTHROMYCIN >=8 RESISTANT Resistant     GENTAMICIN <=0.5 SENSITIVE Sensitive     OXACILLIN <=0.25 SENSITIVE Sensitive     TETRACYCLINE <=1 SENSITIVE Sensitive     VANCOMYCIN 1 SENSITIVE Sensitive     TRIMETH/SULFA <=10 SENSITIVE Sensitive     CLINDAMYCIN RESISTANT Resistant     RIFAMPIN <=0.5 SENSITIVE Sensitive     Inducible Clindamycin POSITIVE Resistant     LINEZOLID 2 SENSITIVE Sensitive     * RARE STAPHYLOCOCCUS AUREUS   Streptococcus anginosis - MIC*    PENICILLIN <=0.06 SENSITIVE Sensitive     CEFTRIAXONE <=0.12 SENSITIVE Sensitive     ERYTHROMYCIN >=8 RESISTANT Resistant     LEVOFLOXACIN <=0.25 SENSITIVE Sensitive     VANCOMYCIN 0.5 SENSITIVE Sensitive     * FEW STREPTOCOCCUS ANGINOSIS    Radiology Reports  No results found.     Signature  -    Susa Raring M.D on 06/26/2023 at 8:07 AM   -  To page go to [email or web address]

## 2023-06-26 NOTE — TOC Progression Note (Signed)
 Transition of Care [facility]) - Progression Note    Patient Details  Name: Derek Thompson MRN: [id number] Date of Birth: Sep 05, 1960  Transition of Care [facility] CM/SW Contact  Mearl Latin, LCSW Phone Number: 06/26/2023, 8:35 AM  Clinical Narrative:    8:35 AM-Awaiting insurance approval from [facility].    Expected Discharge Plan: Skilled Nursing Facility Barriers to Discharge: Continued Medical Work up, English as a second language teacher  Expected Discharge Plan and Services In-house Referral: Clinical Social Work   Post Acute Care Choice: Skilled Nursing Facility Living arrangements for the past 2 months: Hotel/Motel                                       Social Determinants of Health (SDOH) Interventions SDOH Screenings   Food Insecurity: No Food Insecurity (06/14/2023)  Housing: Low Risk  (06/14/2023)  Transportation Needs: No Transportation Needs (06/14/2023)  Utilities: Not At Risk (06/14/2023)  Alcohol Screen: Low Risk  (08/19/2022)  Depression (PHQ2-9): Medium Risk (11/22/2022)  Financial Resource Strain: Low Risk  (08/19/2022)  Tobacco Use: Medium Risk (06/21/2023)    Readmission Risk Interventions     No data to display

## 2023-06-27 ENCOUNTER — Inpatient Hospital Stay (HOSPITAL_COMMUNITY): Payer: BC Managed Care – PPO

## 2023-06-27 DIAGNOSIS — E1100 Type 2 diabetes mellitus with hyperosmolarity without nonketotic hyperglycemic-hyperosmolar coma (NKHHC): Secondary | ICD-10-CM | POA: Diagnosis not present

## 2023-06-27 LAB — BASIC METABOLIC PANEL
Anion gap: 4 — ABNORMAL LOW (ref 5–15)
BUN: 19 mg/dL (ref 8–23)
CO2: 22 mmol/L (ref 22–32)
Calcium: 8.1 mg/dL — ABNORMAL LOW (ref 8.9–10.3)
Chloride: 110 mmol/L (ref 98–111)
Creatinine, Ser: 1.1 mg/dL (ref 0.61–1.24)
GFR, Estimated: >60 mL/min (ref >60)
Glucose, Bld: 124 mg/dL — ABNORMAL HIGH (ref 70–99)
Potassium: 4.3 mmol/L (ref 3.5–5.1)
Sodium: 136 mmol/L (ref 135–145)

## 2023-06-27 LAB — CBC WITH DIFFERENTIAL/PLATELET
Abs Immature Granulocytes: 0.03 10*3/uL (ref 0.00–0.07)
Basophils Absolute: 0 10*3/uL (ref 0.0–0.1)
Basophils Relative: 1 %
Eosinophils Absolute: 0 10*3/uL (ref 0.0–0.5)
Eosinophils Relative: 0 %
HCT: 28.2 % — ABNORMAL LOW (ref 39.0–52.0)
Hemoglobin: 9.1 g/dL — ABNORMAL LOW (ref 13.0–17.0)
Immature Granulocytes: 1 %
Lymphocytes Relative: 18 %
Lymphs Abs: 1.2 10*3/uL (ref 0.7–4.0)
MCH: 27.4 pg (ref 26.0–34.0)
MCHC: 32.3 g/dL (ref 30.0–36.0)
MCV: 84.9 fL (ref 80.0–100.0)
Monocytes Absolute: 0.5 10*3/uL (ref 0.1–1.0)
Monocytes Relative: 7 %
Neutro Abs: 4.8 10*3/uL (ref 1.7–7.7)
Neutrophils Relative %: 73 %
Platelets: 190 10*3/uL (ref 150–400)
RBC: 3.32 MIL/uL — ABNORMAL LOW (ref 4.22–5.81)
RDW: 20.1 % — ABNORMAL HIGH (ref 11.5–15.5)
WBC: 6.5 10*3/uL (ref 4.0–10.5)
nRBC: 0 % (ref 0.0–0.2)

## 2023-06-27 LAB — GLUCOSE, CAPILLARY
Glucose-Capillary: 112 mg/dL — ABNORMAL HIGH (ref 70–99)
Glucose-Capillary: 123 mg/dL — ABNORMAL HIGH (ref 70–99)
Glucose-Capillary: 147 mg/dL — ABNORMAL HIGH (ref 70–99)
Glucose-Capillary: 177 mg/dL — ABNORMAL HIGH (ref 70–99)
Glucose-Capillary: 182 mg/dL — ABNORMAL HIGH (ref 70–99)

## 2023-06-27 LAB — BRAIN NATRIURETIC PEPTIDE: B Natriuretic Peptide: 3618.2 pg/mL — ABNORMAL HIGH (ref 0.0–100.0)

## 2023-06-27 LAB — PROCALCITONIN: Procalcitonin: 5.26 ng/mL

## 2023-06-27 LAB — MAGNESIUM: Magnesium: 1.7 mg/dL (ref 1.7–2.4)

## 2023-06-27 LAB — C-REACTIVE PROTEIN: CRP: 1.4 mg/dL — ABNORMAL HIGH (ref <1.0)

## 2023-06-27 MED ORDER — METOPROLOL TARTRATE 5 MG/5ML IV SOLN
5.0000 mg | Freq: Once | INTRAVENOUS | Status: AC
  Administered 2023-06-27: 5 mg via INTRAVENOUS
  Filled 2023-06-27: qty 5

## 2023-06-27 NOTE — Progress Notes (Signed)
 PROGRESS NOTE        PATIENT DETAILS Name: Derek Thompson Age: 63 y.o. Sex: male Date of Birth: 1961-03-14 Admit Date: 06/13/2023 Admitting Physician Angie Fava, DO [id number], Casimiro Needle, DO  Brief Summary: Patient is a 63 y.o.  male with history of chronic HFrEF, DM-2, HTN, prostate cancer-s/p radiation 2022-who presented with generalized weakness/acute metabolic encephalopathy-he was found to have HHS secondary to right diabetic foot (great/second toe) with gangrene/osteomyelitis, LV thrombus and acute CVA.  He was stabilized in the ICU-and subsequently transferred to [facility] on 2/16.  Significant events: 2/12>> admit to [facility]-HHS/encephalopathy/diabetic foot-developed hypotension-started on Levophed-transfer to ICU. 2/14>> RLE angiogram-small vessel disease. 2/16>> transferred to [facility].  Significant studies: 2/11>> A1c: 13.3 2/12>> MRI brain: Acute infarct right frontal lobe, additional small infarct right cerebellum, left frontal/parietal/occipital lobe. 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 2/12>> renal ultrasound: No hydronephrosis 2/12>> echo: EF 20-25%, LV thrombus.  Grade 2 diastolic dysfunction.  Moderate mitral valve regurgitation 2/13>> LDL: 49.  Significant microbiology data: 2/11>> COVID/influenza/RSV PCR: Negative 2/12>> blood culture: Negative. 2/19>> intraoperative cultures/right foot: Rare Staph aureus  Procedures: 2/14>> RLE angiogram by vascular surgery-small vessel disease. 2/19>> right first and second ray amputation.  Consults: PCCM Orthopedics Cardiology Neurology  Subjective: Patient in bed, appears comfortable, denies any headache, no fever, no chest pain or pressure, no shortness of breath , no abdominal pain. No focal weakness.  Objective: Vitals: Blood pressure 131/76, pulse 96, temperature 98.3 F (36.8 C), temperature source Oral, resp. rate (!)  9, height 6' (1.829 m), weight 83 kg, SpO2 96%.   Exam:  Awake Alert, No new F.N deficits, Normal affect [place].AT,PERRAL Supple Neck, No JVD,   Symmetrical Chest wall movement, Good air movement bilaterally, CTAB RRR,No Gallops, Rubs or new Murmurs,  +ve B.Sounds, Abd Soft, No tenderness,   Right foot under bandage s/p ray amputation  Assessment/Plan:  Septic shock secondary to right diabetic foot with wet gangrene/osteomyelitis involving great/second toe-s/p first and second ray amputation on 2/19 Sepsis physiology has improved-however slight worsening leukocytosis today Remains on Zyvox-Augmentin initially now only on Augmentin after discussions with ID Dr. Ilsa Iha on 06/26/2023, stop date 07/10/2023. Final culture results from the intraoperative specimen, will require 3 weeks of antibiotics based on the results.  Also discussed with Dr. Lajoyce Corners on 06/26/2023.  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  Acute on chronic HFrEF Slightly more short of breath today-has worsening bilateral lower extremity edema-blood pressure soft Holding Lasix for a day due to AKI on 06/24/2023 Hold Entresto/Aldactone as blood pressure is low and has renal dysfunction Blood pressure soft on low-dose beta-blocker and digoxin for now  LV thrombus Initially on heparin drip now on Eliquis.  Seen by cardiology  AKI Likely hemodynamically mediated Hold Entresto and Aldactone, low dose Lasix with caution, monitor bladder scans to rule out obstruction, continue alpha-blocker for underlying BPH  HTN BP soft Holding ARNI/Aldactone-continue low-dose metoprolol with holding  parameters.  Normocytic anemia Secondary to critical illness No evidence of blood loss S/p 1 unit of  PRBC on 2/17-Hb currently stable.  Thrombocytopenia Likely due to sepsis physiology Slowly improving Follow.  Sick euthyroid syndrome TSH/free T4 mildly elevated Repeat thyroid function test in 4 to 6 weeks when he has recovered from acute illness.  DM-2 with poor control/uncontrolled hyperglycemia (A1c 13.3 on 2/11) CBGs stable Semglee 15 units+ SSI Follow/optimize.  Recent Labs    06/26/23 1958 06/27/23 0004 06/27/23 0801  GLUCAP 178* 147* 112*      BMI: Estimated body mass index is 24.82 kg/m as calculated from the following:   Height as of this encounter: 6' (1.829 m).   Weight as of this encounter: 83 kg.   Code status:   Code Status: Full Code   DVT Prophylaxis: SCD's Start: 06/21/23 1157 SCDs Start: 06/13/23 2205 apixaban (ELIQUIS) tablet 5 mg    Family Communication: Mother and 2 sons on 2/20   Disposition Plan: Status is: Inpatient Remains inpatient appropriate because: Severity of illness   Planned Discharge Destination:Skilled nursing facility   Diet: Diet Order             Diet Carb Modified Fluid consistency: Thin; Room service appropriate? No  Diet effective now                    MEDICATIONS: Scheduled Meds:  amoxicillin-clavulanate  1 tablet Oral Q12H   apixaban  5 mg Oral BID   vitamin C  1,000 mg Oral Daily   atorvastatin  80 mg Oral Daily   Chlorhexidine Gluconate Cloth  6 each Topical Daily   feeding supplement  237 mL Oral BID BM   furosemide  20 mg Oral Daily   insulin aspart  0-15 Units Subcutaneous TID WC   insulin aspart  0-5 Units Subcutaneous QHS   insulin glargine  15 Units Subcutaneous Daily   insulin starter kit- pen needles  1 kit Other Once   living well with diabetes book   Does not apply Once   metoprolol succinate  25 mg Oral Daily   multivitamin with minerals  1 tablet Oral Daily   nutrition  supplement (JUVEN)  1 packet Oral BID BM   pantoprazole  40 mg Oral Daily   sodium bicarbonate  650 mg Oral TID   sodium chloride flush  10-40 mL Intracatheter Q12H   tamsulosin  0.4 mg Oral Daily   zinc sulfate (50mg  elemental zinc)  220 mg Oral Daily   Continuous Infusions:  sodium chloride 10 mL/hr at 06/23/23 1504   PRN Meds:.acetaminophen **OR** acetaminophen, guaiFENesin, loperamide, ondansetron (ZOFRAN) IV, mouth rinse   I have personally reviewed following labs and imaging studies  LABORATORY DATA:   Data Review:   Inpatient Medications  Scheduled Meds:  amoxicillin-clavulanate  1 tablet Oral Q12H   apixaban  5 mg Oral BID   vitamin C  1,000 mg Oral Daily   atorvastatin  80 mg Oral Daily   Chlorhexidine Gluconate Cloth  6 each Topical Daily   feeding supplement  237 mL Oral BID BM   furosemide  20 mg Oral Daily   insulin aspart  0-15 Units Subcutaneous TID WC   insulin aspart  0-5 Units Subcutaneous QHS   insulin glargine  15 Units Subcutaneous Daily   insulin starter kit- pen needles  1 kit Other Once   living well with diabetes book   Does not apply Once   metoprolol succinate  25 mg Oral Daily   multivitamin with minerals  1 tablet Oral Daily   nutrition supplement (JUVEN)  1 packet Oral BID BM   pantoprazole  40 mg Oral Daily   sodium bicarbonate  650 mg Oral TID   sodium chloride flush  10-40 mL Intracatheter Q12H   tamsulosin  0.4 mg Oral Daily   zinc sulfate (50mg  elemental zinc)  220 mg Oral Daily   Continuous Infusions:  sodium chloride 10 mL/hr at 06/23/23 1504   PRN Meds:.acetaminophen **OR** acetaminophen, guaiFENesin, loperamide, ondansetron (ZOFRAN) IV, mouth rinse  DVT Prophylaxis  SCD's Start: 06/21/23 1157 SCDs Start: 06/13/23 2205 apixaban (ELIQUIS) tablet 5 mg   Recent Labs  Lab 06/22/23 0520 06/23/23 0742 06/24/23 0621 06/26/23 0521 06/27/23 0422  WBC 10.6* 14.1* 12.0* 7.5 6.5  HGB 10.5* 9.3* 9.5* 10.3* 9.1*  HCT 32.7* 28.6*  30.0* 32.4* 28.2*  PLT 151 145* 142* 218 190  MCV 83.8 83.6 86.0 86.9 84.9  MCH 26.9 27.2 27.2 27.6 27.4  MCHC 32.1 32.5 31.7 31.8 32.3  RDW 20.2* 19.9* 20.2* 20.6* 20.1*  LYMPHSABS  --   --  1.0 1.5 1.2  MONOABS  --   --  0.9 0.6 0.5  EOSABS  --   --  0.1 0.0 0.0  BASOSABS  --   --  0.0 0.0 0.0    Recent Labs  Lab 06/21/23 0428 06/22/23 0520 06/23/23 0742 06/24/23 0621 06/26/23 0521 06/27/23 0422  NA 133* 135 135 134* 137 136  K 4.0 4.5 3.9 4.1 4.3 4.3  CL 109 112* 112* 108 107 110  CO2 18* 17* 16* 19* 23 22  ANIONGAP 6 6 7 7 7  4*  GLUCOSE 160* 81 118* 142* 124* 124*  BUN 20 18 19 23 18 19   CREATININE 1.44* 1.42* 1.48* 1.47* 1.31* 1.10  AST  --   --   --  17  --   --   ALT  --   --   --  10  --   --   ALKPHOS  --   --   --  34*  --   --   BILITOT  --   --   --  0.6  --   --   ALBUMIN  --   --   --  <1.5*  --   --   CRP  --   --   --   --  2.6* 1.4*  PROCALCITON  --   --   --  35.94 13.18 5.26  BNP  --   --  4,255.9*  --  3,071.4* 3,618.2*  MG 1.9  --  1.5* 2.2 1.9 1.7  PHOS 2.7  --   --   --   --   --   CALCIUM 7.9* 8.1* 7.8* 7.9* 8.3* 8.1*      Recent Labs  Lab 06/21/23 0428 06/22/23 0520 06/23/23 0742 06/24/23 0621 06/26/23 0521 06/27/23 0422  CRP  --   --   --   --  2.6* 1.4*  PROCALCITON  --   --   --  35.94 13.18 5.26  BNP  --   --  4,255.9*  --  3,071.4* 3,618.2*  MG 1.9  --  1.5* 2.2 1.9 1.7  CALCIUM 7.9* 8.1* 7.8* 7.9* 8.3* 8.1*    --------------------------------------------------------------------------------------------------------------- Lab Results  Component Value Date   CHOL 85 06/15/2023   HDL 10 (L) 06/15/2023   LDLCALC 49 06/15/2023   TRIG 131 06/15/2023   CHOLHDL 8.5 06/15/2023    Lab Results  Component Value Date   HGBA1C 13.3 (H) 06/13/2023  Radiology Reports  DG Chest Port 1 View Result Date: 06/27/2023 CLINICAL DATA:  Shortness of breath. EXAM: PORTABLE CHEST 1 VIEW COMPARISON:  06/23/2023. FINDINGS: The heart is  borderline enlarged and mediastinal contours are within normal limits. The pulmonary vasculature is mildly distended. There is atherosclerotic calcification of the aorta. Mild hazy airspace opacities are noted bilaterally with a basilar predominance, slightly improved from the prior exam. There is a small left pleural effusion. No pneumothorax is seen. No acute osseous abnormality. IMPRESSION: 1. Hazy airspace opacities bilaterally, slightly improved from the prior exam. 2. Small left pleural effusion. Electronically Signed   By: Thornell Sartorius M.D.   On: 06/27/2023 07:10    Signature  -   Susa Raring M.D on 06/27/2023 at 8:04 AM   -  To page go to [email or web address]

## 2023-06-27 NOTE — Plan of Care (Signed)

## 2023-06-27 NOTE — Progress Notes (Signed)
 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 PMH: poorly controlled DM2, chronic diastolic CHF, chronic R sided heart failure, anemia of chronic disease, L bundle branch block   OT comments  Pt progressing toward goals, overall needs min +2 for transfers with RW. Pt needs mod cues to follow WB precautions throughout, therapist's foot placed behind pt's foot to promote WB precautions during session,pt performing standing ADL at sink with minA. Pt presenting with impairments listed below, will follow acutely. Patient will benefit from continued inpatient follow up therapy, <3 hours/day to maximize safety/ind with ADL/functional mobility.       If plan is discharge home, recommend the following:  A lot of help with walking and/or transfers;A lot of help with bathing/dressing/bathroom;Assistance with cooking/housework;Direct supervision/assist for medications management;Direct supervision/assist for financial management;Assist for transportation;Help with stairs or ramp for entrance   Equipment Recommendations  Other (comment) (defer)    Recommendations for Other Services PT consult    Precautions / Restrictions Precautions Precautions: Fall Recall of Precautions/Restrictions: Impaired Precaution/Restrictions Comments: Incontinent of bowels Restrictions Weight Bearing Restrictions Per Provider Order: Yes RLE Weight Bearing Per Provider Order: Touchdown weight bearing Other Position/Activity Restrictions: wear post op shoe/boot when performing functional mobility       Mobility Bed Mobility               General bed mobility comments: OOB in chair    Transfers Overall  transfer level: Needs assistance Equipment used: Rolling walker (2 wheels) Transfers: Sit to/from Stand, Bed to chair/wheelchair/BSC Sit to Stand: Min assist, +2 safety/equipment                 Balance Overall balance assessment: Needs assistance Sitting-balance support: Bilateral upper extremity supported, Feet supported Sitting balance-Leahy Scale: Fair Sitting balance - Comments: static sitting EOB   Standing balance support: Bilateral upper extremity supported, During functional activity, Reliant on assistive device for balance Standing balance-Leahy Scale: Poor Standing balance comment: Reliant on RW for support. Difficulty maintaining balance when adhering to RLE WB precautions                           ADL either performed or assessed with clinical judgement   ADL Overall ADL's : Needs assistance/impaired     Grooming: Minimal assistance;Standing;Wash/dry face Grooming Details (indicate cue type and reason): at sink                                    Extremity/Trunk Assessment Upper Extremity Assessment Upper Extremity Assessment: Left hand dominant LUE Deficits / Details: Impaired proprioception, FM, and gross motor coodination LUE Sensation: decreased light touch;decreased proprioception LUE Coordination: decreased fine motor;decreased gross motor   Lower Extremity Assessment Lower Extremity Assessment: Defer to PT evaluation        Vision   Vision Assessment?: Wears glasses for reading;Wears glasses for driving   Perception     Praxis Praxis Praxis: Not tested   Communication Communication Communication: Impaired Factors Affecting Communication: Reduced clarity of speech   Cognition Arousal: Alert Behavior During Therapy: Flat affect Cognition: Cognition impaired     Awareness: Intellectual  awareness intact, Online awareness impaired Memory impairment (select all impairments): Short-term memory, Working  memory Attention impairment (select first level of impairment): Sustained attention   OT - Cognition Comments: Required cues to recall WB precautions. MAX verbal/tactile cues required throughout transfer to adhere functionally                 Following commands: Impaired Following commands impaired: Follows one step commands inconsistently      Cueing   Cueing Techniques: Verbal cues, Tactile cues  Exercises      Shoulder Instructions       General Comments VSS    Pertinent Vitals/ Pain       Pain Assessment Pain Assessment: No/denies pain  Home Living                                          Prior Functioning/Environment              Frequency  Min 1X/week        Progress Toward Goals  OT Goals(current goals can now be found in the care plan section)  Progress towards OT goals: Progressing toward goals  Acute Rehab OT Goals Patient Stated Goal: none stated OT Goal Formulation: With patient Time For Goal Achievement: 07/11/23 Potential to Achieve Goals: Good ADL Goals Additional ADL Goal #1: pt will perform x3 standing ADLs with min cues for WB precautions  Plan      Co-evaluation                 AM-PAC OT "6 Clicks" Daily Activity     Outcome Measure   Help from another person eating meals?: A Little Help from another person taking care of personal grooming?: A Little Help from another person toileting, which includes using toliet, bedpan, or urinal?: A Lot Help from another person bathing (including washing, rinsing, drying)?: A Lot Help from another person to put on and taking off regular upper body clothing?: A Little Help from another person to put on and taking off regular lower body clothing?: A Lot 6 Click Score: 15    End of Session Equipment Utilized During Treatment: Gait belt;Rolling walker (2 wheels)  OT Visit Diagnosis: Unsteadiness on feet (R26.81);Other abnormalities of gait and mobility  (R26.89);Muscle weakness (generalized) (M62.81)   Activity Tolerance Patient tolerated treatment well   Patient Left in chair;with call bell/phone within reach;with chair alarm set   Nurse Communication Mobility status        Time: 1610-9604 OT Time Calculation (min): 16 min  Charges: OT General Charges $OT Visit: 1 Visit OT Treatments $Self Care/Home Management : 8-22 mins  Carver Fila, OTD, OTR/L SecureChat Preferred Acute Rehab [phone]   Dorota Heinrichs K Koonce 06/27/2023, 12:00 PM

## 2023-06-27 NOTE — TOC Progression Note (Signed)
 Transition of Care [facility]) - Progression Note    Patient Details  Name: Derek Thompson MRN: [id number] Date of Birth: [birth year]  Transition of Care [facility]) CM/SW Contact  Mearl Latin, LCSW Phone Number: 06/27/2023, 8:34 AM  Clinical Narrative:    [facility] awaiting insurance approval.    Expected Discharge Plan: Skilled Nursing Facility Barriers to Discharge: Continued Medical Work up, English as a second language teacher  Expected Discharge Plan and Services In-house Referral: Clinical Social Work   Post Acute Care Choice: Skilled Nursing Facility Living arrangements for the past 2 months: Hotel/Motel                                       Social Determinants of Health (SDOH) Interventions SDOH Screenings   Food Insecurity: No Food Insecurity (06/14/2023)  Housing: Low Risk  (06/14/2023)  Transportation Needs: No Transportation Needs (06/14/2023)  Utilities: Not At Risk (06/14/2023)  Alcohol Screen: Low Risk  (08/19/2022)  Depression (PHQ2-9): Medium Risk (11/22/2022)  Financial Resource Strain: Low Risk  (08/19/2022)  Tobacco Use: Medium Risk (06/21/2023)    Readmission Risk Interventions     No data to display

## 2023-06-27 NOTE — Progress Notes (Signed)
 Nutrition Follow-up  DOCUMENTATION CODES:   Not applicable  INTERVENTION:  Continue with: Ensure Plus High Protein po BID, each supplement provides 350 kcal and 20 grams of protein. Multivitamin/minerals CM diet   NUTRITION DIAGNOSIS:   Increased nutrient needs related to acute illness as evidenced by estimated needs.    GOAL:   Patient will meet greater than or equal to 90% of their needs    MONITOR:   PO intake  REASON FOR ASSESSMENT:   Consult Assessment of nutrition requirement/status  ASSESSMENT:   63 y.o. M, presented to ED from home with complaints of, generalized weakness. Patient reports 1 week of progressive weakness, stuffy/runny nose,and cough. Admitted with primary problem of hyperosmolar hyperglycemic. PMH: HTN, poorly controlled T2DM, Prostate cancer, MI, CAD, G2DD, LBBB, and LV global hypokinesis. Patient had no concerns at time of visit.  Continues to have good appetite with 75-100% of most meals being consumed.  Independent feeding ability.  Labs, meds reviewed. Patient pending SNF placement.   Significant events: 2/12>> admit to [facility]/encephalopathy/diabetic foot-developed hypotension-started on Levophed-transfer to ICU. 2/14>> RLE angiogram-small vessel disease. 2/16>> transferred to [facility].   Significant studies: 2/11>> A1c: 13.3 2/12>> MRI brain: Acute infarct right frontal lobe, additional small infarct right cerebellum, left frontal/parietal/occipital lobe. 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 2/12>> renal ultrasound: No hydronephrosis 2/12>> echo: EF 20-25%, LV thrombus.  Grade 2 diastolic dysfunction.  Moderate mitral valve regurgitation 2/13>> LDL: 49.   Significant microbiology data: 2/11>> COVID/influenza/RSV PCR: Negative 2/12>> blood culture: Negative. 2/19>> intraoperative cultures/right foot: Rare Staph aureus   Procedures: 2/14>> RLE angiogram by  vascular surgery-small vessel disease. 2/19>> right first and second ray amputation.    Hospital weight history: 06/27/23 0500 83 kg 182.98 lbs  06/26/23 0500 80.2 kg 176.81 lbs  06/25/23 0529 84.3 kg 185.85 lbs  06/24/23 0459 83.4 kg 183.86 lbs  06/23/23 0500 84.7 kg 186.73 lbs  06/22/23 0500 88.6 kg 195.33 lbs  06/21/23 0328 85.7 kg 188.93 lbs  06/20/23 0313 87.1 kg 192.02 lbs  06/19/23 0407 83.7 kg 184.53 lbs  06/16/23 0500 77.5 kg 170.86 lbs  06/15/23 0500 77.5 kg 170.86 lbs  06/14/23 0520 77.5 kg 170.86 lbs  06/13/23 1851 80.7 kg 178 lbs   NUTRITION - FOCUSED PHYSICAL EXAM:  Flowsheet Row Most Recent Value  Orbital Region No depletion  Upper Arm Region No depletion  Thoracic and Lumbar Region No depletion  Buccal Region No depletion  Temple Region No depletion  Clavicle Bone Region No depletion  Clavicle and Acromion Bone Region No depletion  Scapular Bone Region No depletion  Patellar Region No depletion  Anterior Thigh Region No depletion  Posterior Calf Region No depletion  Edema (RD Assessment) Mild  Hair Reviewed  Eyes Reviewed  Mouth Reviewed  Skin Reviewed  Nails Reviewed       Diet Order:   Diet Order             Diet Carb Modified Fluid consistency: Thin; Room service appropriate? No  Diet effective now                   EDUCATION NEEDS:   Education needs have been addressed  Skin:  Skin Assessment: Reviewed RN Assessment Skin Integrity Issues:: Other (Comment) Other: Non pressure wound Toe  Last BM:  06/27/23 (type 2)  Height:   Ht Readings from Last 1 Encounters:  06/13/23 6' (1.829 m)    Weight:  Wt Readings from Last 1 Encounters:  06/27/23 83 kg    Ideal Body Weight:     BMI:  Body mass index is 24.82 kg/m.  Estimated Nutritional Needs:   Kcal:  0981-1914 kcal  Protein:  100-110 g  Fluid:  57ml/kcal    Jamelle Haring RDN, LDN Clinical Dietitian   If unable to reach, please contact "RD Inpatient" secure  chat group between 8 am-4 pm daily"

## 2023-06-28 DIAGNOSIS — E1100 Type 2 diabetes mellitus with hyperosmolarity without nonketotic hyperglycemic-hyperosmolar coma (NKHHC): Secondary | ICD-10-CM | POA: Diagnosis not present

## 2023-06-28 LAB — CBC WITH DIFFERENTIAL/PLATELET
Abs Immature Granulocytes: 0.04 10*3/uL (ref 0.00–0.07)
Basophils Absolute: 0 10*3/uL (ref 0.0–0.1)
Basophils Relative: 0 %
Eosinophils Absolute: 0 10*3/uL (ref 0.0–0.5)
Eosinophils Relative: 0 %
HCT: 28.5 % — ABNORMAL LOW (ref 39.0–52.0)
Hemoglobin: 9 g/dL — ABNORMAL LOW (ref 13.0–17.0)
Immature Granulocytes: 1 %
Lymphocytes Relative: 15 %
Lymphs Abs: 1.3 10*3/uL (ref 0.7–4.0)
MCH: 27.3 pg (ref 26.0–34.0)
MCHC: 31.6 g/dL (ref 30.0–36.0)
MCV: 86.4 fL (ref 80.0–100.0)
Monocytes Absolute: 0.6 10*3/uL (ref 0.1–1.0)
Monocytes Relative: 7 %
Neutro Abs: 6.6 10*3/uL (ref 1.7–7.7)
Neutrophils Relative %: 77 %
Platelets: 193 10*3/uL (ref 150–400)
RBC: 3.3 MIL/uL — ABNORMAL LOW (ref 4.22–5.81)
RDW: 20.2 % — ABNORMAL HIGH (ref 11.5–15.5)
WBC: 8.5 10*3/uL (ref 4.0–10.5)
nRBC: 0 % (ref 0.0–0.2)

## 2023-06-28 LAB — GLUCOSE, CAPILLARY
Glucose-Capillary: 87 mg/dL (ref 70–99)
Glucose-Capillary: 132 mg/dL — ABNORMAL HIGH (ref 70–99)
Glucose-Capillary: 153 mg/dL — ABNORMAL HIGH (ref 70–99)
Glucose-Capillary: 168 mg/dL — ABNORMAL HIGH (ref 70–99)

## 2023-06-28 LAB — BASIC METABOLIC PANEL
Anion gap: 8 (ref 5–15)
BUN: 20 mg/dL (ref 8–23)
CO2: 22 mmol/L (ref 22–32)
Calcium: 7.7 mg/dL — ABNORMAL LOW (ref 8.9–10.3)
Chloride: 108 mmol/L (ref 98–111)
Creatinine, Ser: 1.11 mg/dL (ref 0.61–1.24)
GFR, Estimated: >60 mL/min (ref >60)
Glucose, Bld: 84 mg/dL (ref 70–99)
Potassium: 4.5 mmol/L (ref 3.5–5.1)
Sodium: 138 mmol/L (ref 135–145)

## 2023-06-28 LAB — C-REACTIVE PROTEIN: CRP: 1.2 mg/dL — ABNORMAL HIGH (ref <1.0)

## 2023-06-28 LAB — MAGNESIUM: Magnesium: 1.5 mg/dL — ABNORMAL LOW (ref 1.7–2.4)

## 2023-06-28 LAB — BRAIN NATRIURETIC PEPTIDE: B Natriuretic Peptide: 2724.9 pg/mL — ABNORMAL HIGH (ref 0.0–100.0)

## 2023-06-28 LAB — PROCALCITONIN: Procalcitonin: 2.29 ng/mL

## 2023-06-28 MED ORDER — DIGOXIN 0.25 MG/ML IJ SOLN
0.2500 mg | Freq: Four times a day (QID) | INTRAMUSCULAR | Status: AC
  Administered 2023-06-28 (×2): 0.25 mg via INTRAVENOUS
  Filled 2023-06-28 (×2): qty 1

## 2023-06-28 MED ORDER — MAGNESIUM SULFATE 4 GM/100ML IV SOLN
4.0000 g | Freq: Once | INTRAVENOUS | Status: AC
  Administered 2023-06-28: 4 g via INTRAVENOUS
  Filled 2023-06-28: qty 100

## 2023-06-28 MED ORDER — INSULIN GLARGINE 100 UNIT/ML ~~LOC~~ SOLN
12.0000 [IU] | Freq: Every day | SUBCUTANEOUS | Status: DC
  Administered 2023-06-29: 12 [IU] via SUBCUTANEOUS
  Filled 2023-06-28: qty 0.12

## 2023-06-28 NOTE — Progress Notes (Signed)
 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 PMH: poorly controlled DM2, chronic diastolic CHF, chronic R sided heart failure, anemia of chronic disease, L bundle branch block    PT Comments  Pt tolerated treatment well today. Pt was able to ambulate in room with RW CGA/Min A however continues to require constant cues for WB precautions. Pt declined hallway ambulation today. No change in DC/DME recs at this time. PT will continue to follow.     If plan is discharge home, recommend the following: A little help with walking and/or transfers;A lot of help with bathing/dressing/bathroom;Assistance with cooking/housework;Help with stairs or ramp for entrance;Assist for transportation   Can travel by private vehicle     No  Equipment Recommendations  Rolling walker (2 wheels);Wheelchair (measurements PT);Wheelchair cushion (measurements PT);Hospital bed    Recommendations for Other Services       Precautions / Restrictions Precautions Precautions: Fall Recall of Precautions/Restrictions: Impaired Restrictions Weight Bearing Restrictions Per Provider Order: Yes RLE Weight Bearing Per Provider Order: Touchdown weight bearing     Mobility  Bed Mobility Overal bed mobility: Needs Assistance Bed Mobility: Supine to Sit, Sit to Supine     Supine to sit: Contact guard Sit to supine: Contact guard assist        Transfers Overall transfer level: Needs assistance Equipment used: Rolling walker (2 wheels) Transfers: Sit to/from Stand Sit to Stand: Min assist, +2 safety/equipment           General transfer comment: Cues for hand placement and WB precautions.     Ambulation/Gait Ambulation/Gait assistance: Contact guard assist, +2 safety/equipment, Min assist Gait Distance (Feet): 15 Feet Assistive device: Rolling walker (2 wheels) Gait Pattern/deviations: Step-to pattern Gait velocity: reduced     General Gait Details: Pt continues to require constant cues for WB precautions however overall steady with RW. Pt declined hallway ambulation today.   Stairs             Wheelchair Mobility     Tilt Bed    Modified Rankin (Stroke Patients Only)       Balance Overall balance assessment: Needs assistance Sitting-balance support: Bilateral upper extremity supported, Feet supported Sitting balance-Leahy Scale: Fair     Standing balance support: Bilateral upper extremity supported, During functional activity, Reliant on assistive device for balance Standing balance-Leahy Scale: Poor Standing balance comment: Reliant on RW for support. Difficulty maintaining balance when adhering to RLE WB precautions                            Communication Communication Communication: Impaired Factors Affecting Communication: Reduced clarity of speech  Cognition Arousal: Alert Behavior During Therapy: Flat affect   PT - Cognitive impairments: Problem solving                       PT - Cognition Comments: pt able to follow commands however noted with slow processing. Very poor recall and adherence to WB precautions. Following commands: Impaired Following commands impaired: Follows one step commands inconsistently    Cueing Cueing Techniques: Verbal cues, Tactile cues  Exercises      General Comments General comments (skin integrity, edema, etc.):  VSS on RA      Pertinent Vitals/Pain Pain Assessment Pain Assessment: No/denies pain    Home Living                          Prior Function            PT Goals (current goals can now be found in the care plan section) Progress towards PT goals:  Progressing toward goals    Frequency    Min 1X/week      PT Plan      Co-evaluation              AM-PAC PT "6 Clicks" Mobility   Outcome Measure  Help needed turning from your back to your side while in a flat bed without using bedrails?: A Little Help needed moving from lying on your back to sitting on the side of a flat bed without using bedrails?: A Lot Help needed moving to and from a bed to a chair (including a wheelchair)?: A Lot Help needed standing up from a chair using your arms (e.g., wheelchair or bedside chair)?: A Lot Help needed to walk in hospital room?: Total Help needed climbing 3-5 steps with a railing? : Total 6 Click Score: 11    End of Session Equipment Utilized During Treatment: Gait belt Activity Tolerance: Patient tolerated treatment well Patient left: in bed;with call bell/phone within reach;with bed alarm set;with family/visitor present Nurse Communication: Mobility status PT Visit Diagnosis: Unsteadiness on feet (R26.81);Muscle weakness (generalized) (M62.81);Difficulty in walking, not elsewhere classified (R26.2)     Time: 1610-9604 PT Time Calculation (min) (ACUTE ONLY): 13 min  Charges:    $Gait Training: 8-22 mins PT General Charges $$ ACUTE PT VISIT: 1 Visit                     Shela Nevin, PT, DPT Acute Rehab Services [phone]    [name] 06/28/2023, 3:38 PM

## 2023-06-28 NOTE — TOC Progression Note (Signed)
 Transition of Care [facility]) - Progression Note    Patient Details  Name: Derek Thompson Reason MRN: [id number] Date of Birth: 17-May-1960  Transition of [facility]) CM/SW Contact  Mearl Latin, LCSW Phone Number: 06/28/2023, 1:41 PM  Clinical Narrative:    [facility] has received insurance approval for patient. Continuing to follow for medical workup.    Expected Discharge Plan: Skilled Nursing Facility Barriers to Discharge: Continued Medical Work up  Expected Discharge Plan and Services In-house Referral: Clinical Social Work   Post Acute Care Choice: Skilled Nursing Facility Living arrangements for the past 2 months: Hotel/Motel                                       Social Determinants of Health (SDOH) Interventions SDOH Screenings   Food Insecurity: No Food Insecurity (06/14/2023)  Housing: Low Risk  (06/14/2023)  Transportation Needs: No Transportation Needs (06/14/2023)  Utilities: Not At Risk (06/14/2023)  Alcohol Screen: Low Risk  (08/19/2022)  Depression (PHQ2-9): Medium Risk (11/22/2022)  Financial Resource Strain: Low Risk  (08/19/2022)  Tobacco Use: Medium Risk (06/21/2023)    Readmission Risk Interventions     No data to display

## 2023-06-28 NOTE — Progress Notes (Signed)
 PROGRESS NOTE        PATIENT DETAILS Name: Derek Thompson Age: 63 y.o. Sex: male Date of Birth: Aug 09, 1960 Admit Date: 06/13/2023 Admitting Physician Angie Fava, DO WUJ:[id number], Casimiro Needle, DO  Brief Summary: Patient is a 63 y.o.  male with history of chronic HFrEF, DM-2, HTN, prostate cancer-s/p radiation 2022-who presented with generalized weakness/acute metabolic encephalopathy-he was found to have HHS secondary to right diabetic foot (great/second toe) with gangrene/osteomyelitis, LV thrombus and acute CVA.  He was stabilized in the ICU-and subsequently transferred to [facility] on 2/16.  Significant events: 2/12>> admit to [facility]-HHS/encephalopathy/diabetic foot-developed hypotension-started on Levophed-transfer to ICU. 2/14>> RLE angiogram-small vessel disease. 2/16>> transferred to [facility]  Significant studies: 2/11>> A1c: 13.3 2/12>> MRI brain: Acute infarct right frontal lobe, additional small infarct right cerebellum, left frontal/parietal/occipital lobe. 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 2/12>> renal ultrasound: No hydronephrosis 2/12>> echo: EF 20-25%, LV thrombus.  Grade 2 diastolic dysfunction.  Moderate mitral valve regurgitation 2/13>> LDL: 49.  Significant microbiology data: 2/11>> COVID/influenza/RSV PCR: Negative 2/12>> blood culture: Negative. 2/19>> intraoperative cultures/right foot: Rare Staph aureus  Procedures: 2/14>> RLE angiogram by vascular surgery-small vessel disease. 2/19>> right first and second ray amputation.  Consults: PCCM Orthopedics Cardiology Neurology  Subjective: Patient in bed, appears comfortable, denies any headache, no fever, no chest pain or pressure, no shortness of breath , no abdominal pain. No focal weakness.  Objective: Vitals: Blood pressure 130/77, pulse (!) 104, temperature 98.7 F (37.1 C), temperature source Oral, resp.  rate 18, height 6' (1.829 m), weight 82.5 kg, SpO2 96%.   Exam:  Awake Alert, No new F.N deficits, Normal affect Jeannette.AT,PERRAL Supple Neck, No JVD,   Symmetrical Chest wall movement, Good air movement bilaterally, CTAB RRR,No Gallops, Rubs or new Murmurs,  +ve B.Sounds, Abd Soft, No tenderness,   Right foot under bandage s/p ray amputation  Assessment/Plan:  Septic shock secondary to right diabetic foot with wet gangrene/osteomyelitis involving great/second toe-s/p first and second ray amputation on 2/19 Sepsis physiology has improved-however slight worsening leukocytosis today He was initially on Zyvox-Augmentin initially now only on Augmentin after discussions with ID Dr. Ilsa Iha on 06/26/2023, stop date 07/10/2023. Final culture results from the intraoperative specimen, will require 3 weeks of antibiotics based on the results.  Also discussed with Dr. Lajoyce Corners on 06/26/2023.  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  Acute on chronic HFrEF Slightly more short of breath today-has worsening bilateral lower extremity edema-blood pressure soft Holding Lasix for a day due to AKI on 06/24/2023 Hold Entresto/Aldactone as blood pressure is low and has renal dysfunction Blood pressure soft on low-dose beta-blocker and digoxin for now  LV thrombus Initially on heparin drip now on Eliquis.  Seen by cardiology  AKI Likely hemodynamically mediated Hold Entresto and Aldactone, low dose Lasix with caution, monitor bladder scans to rule out obstruction, continue alpha-blocker for underlying BPH  Hypomagnesemia.  Replaced.    HTN BP soft Holding  ARNI/Aldactone-continue low-dose metoprolol with holding parameters.  Normocytic anemia Secondary to critical illness No  evidence of blood loss S/p 1 unit of PRBC on 2/17-Hb currently stable.  Thrombocytopenia Likely due to sepsis physiology Slowly improving Follow.  Sick euthyroid syndrome TSH/free T4 mildly elevated Repeat thyroid function test in 4 to 6 weeks when he has recovered from acute illness.  DM-2 with poor control/uncontrolled hyperglycemia (A1c 13.3 on 2/11) CBGs stable Semglee 15 units+ SSI Follow/optimize.  Recent Labs    06/27/23 1547 06/27/23 2043 06/28/23 0750  GLUCAP 177* 123* 87      BMI: Estimated body mass index is 24.67 kg/m as calculated from the following:   Height as of this encounter: 6' (1.829 m).   Weight as of this encounter: 82.5 kg.   Code status:   Code Status: Full Code   DVT Prophylaxis: SCD's Start: 06/21/23 1157 SCDs Start: 06/13/23 2205 apixaban (ELIQUIS) tablet 5 mg    Family Communication: Mother and 2 sons on 2/20   Disposition Plan: Status is: Inpatient Remains inpatient appropriate because: Severity of illness   Planned Discharge Destination:Skilled nursing facility   Diet: Diet Order             Diet Carb Modified Fluid consistency: Thin; Room service appropriate? No  Diet effective now                    MEDICATIONS: Scheduled Meds:  amoxicillin-clavulanate  1 tablet Oral Q12H   apixaban  5 mg Oral BID   vitamin C  1,000 mg Oral Daily   atorvastatin  80 mg Oral Daily   Chlorhexidine Gluconate Cloth  6 each Topical Daily   digoxin  0.25 mg Intravenous Q6H   feeding supplement  237 mL Oral BID BM   furosemide  20 mg Oral Daily   insulin aspart  0-15 Units Subcutaneous TID WC   insulin aspart  0-5 Units Subcutaneous QHS   insulin glargine  15 Units Subcutaneous Daily   insulin starter kit- pen needles  1 kit Other Once   living well with diabetes book   Does not apply Once   metoprolol  succinate  25 mg Oral Daily   multivitamin with minerals  1 tablet Oral Daily   nutrition supplement (JUVEN)  1 packet Oral BID BM   pantoprazole  40 mg Oral Daily   sodium bicarbonate  650 mg Oral TID   sodium chloride flush  10-40 mL Intracatheter Q12H   tamsulosin  0.4 mg Oral Daily   zinc sulfate (50mg  elemental zinc)  220 mg Oral Daily   Continuous Infusions:  sodium chloride 10 mL/hr at 06/23/23 1504   magnesium sulfate bolus IVPB     PRN Meds:.acetaminophen **OR** acetaminophen, guaiFENesin, loperamide, ondansetron (ZOFRAN) IV, mouth rinse   I have personally reviewed following labs and imaging studies  LABORATORY DATA:   Data Review:   Inpatient Medications  Scheduled Meds:  amoxicillin-clavulanate  1 tablet Oral Q12H   apixaban  5 mg Oral BID   vitamin C  1,000 mg Oral Daily   atorvastatin  80 mg Oral Daily   Chlorhexidine Gluconate Cloth  6 each Topical Daily   digoxin  0.25 mg Intravenous Q6H   feeding supplement  237 mL Oral BID BM   furosemide  20 mg Oral Daily   insulin aspart  0-15 Units Subcutaneous TID WC   insulin aspart  0-5 Units Subcutaneous QHS   insulin glargine  15 Units Subcutaneous Daily   insulin starter kit- pen needles  1 kit Other Once   living well with  diabetes book   Does not apply Once   metoprolol succinate  25 mg Oral Daily   multivitamin with minerals  1 tablet Oral Daily   nutrition supplement (JUVEN)  1 packet Oral BID BM   pantoprazole  40 mg Oral Daily   sodium bicarbonate  650 mg Oral TID   sodium chloride flush  10-40 mL Intracatheter Q12H   tamsulosin  0.4 mg Oral Daily   zinc sulfate (50mg  elemental zinc)  220 mg Oral Daily   Continuous Infusions:  sodium chloride 10 mL/hr at 06/23/23 1504   magnesium sulfate bolus IVPB     PRN Meds:.acetaminophen **OR** acetaminophen, guaiFENesin, loperamide, ondansetron (ZOFRAN) IV, mouth rinse  DVT Prophylaxis  SCD's Start: 06/21/23 1157 SCDs Start: 06/13/23 2205 apixaban  (ELIQUIS) tablet 5 mg   Recent Labs  Lab 06/23/23 0742 06/24/23 0621 06/26/23 0521 06/27/23 0422 06/28/23 0439  WBC 14.1* 12.0* 7.5 6.5 8.5  HGB 9.3* 9.5* 10.3* 9.1* 9.0*  HCT 28.6* 30.0* 32.4* 28.2* 28.5*  PLT 145* 142* 218 190 193  MCV 83.6 86.0 86.9 84.9 86.4  MCH 27.2 27.2 27.6 27.4 27.3  MCHC 32.5 31.7 31.8 32.3 31.6  RDW 19.9* 20.2* 20.6* 20.1* 20.2*  LYMPHSABS  --  1.0 1.5 1.2 1.3  MONOABS  --  0.9 0.6 0.5 0.6  EOSABS  --  0.1 0.0 0.0 0.0  BASOSABS  --  0.0 0.0 0.0 0.0    Recent Labs  Lab 06/23/23 0742 06/24/23 0621 06/26/23 0521 06/27/23 0422 06/28/23 0439  NA 135 134* 137 136 138  K 3.9 4.1 4.3 4.3 4.5  CL 112* 108 107 110 108  CO2 16* 19* 23 22 22   ANIONGAP 7 7 7  4* 8  GLUCOSE 118* 142* 124* 124* 84  BUN 19 23 18 19 20   CREATININE 1.48* 1.47* 1.31* 1.10 1.11  AST  --  17  --   --   --   ALT  --  10  --   --   --   ALKPHOS  --  34*  --   --   --   BILITOT  --  0.6  --   --   --   ALBUMIN  --  <1.5*  --   --   --   CRP  --   --  2.6* 1.4* 1.2*  PROCALCITON  --  35.94 13.18 5.26 2.29  BNP 4,255.9*  --  3,071.4* 3,618.2* 2,724.9*  MG 1.5* 2.2 1.9 1.7 1.5*  CALCIUM 7.8* 7.9* 8.3* 8.1* 7.7*      Recent Labs  Lab 06/23/23 0742 06/24/23 0621 06/26/23 0521 06/27/23 0422 06/28/23 0439  CRP  --   --  2.6* 1.4* 1.2*  PROCALCITON  --  35.94 13.18 5.26 2.29  BNP 4,255.9*  --  3,071.4* 3,618.2* 2,724.9*  MG 1.5* 2.2 1.9 1.7 1.5*  CALCIUM 7.8* 7.9* 8.3* 8.1* 7.7*    --------------------------------------------------------------------------------------------------------------- Lab Results  Component Value Date   CHOL 85 06/15/2023   HDL 10 (L) 06/15/2023   LDLCALC 49 06/15/2023   TRIG 131 06/15/2023   CHOLHDL 8.5 06/15/2023    Lab Results  Component Value Date   HGBA1C 13.3 (H) 06/13/2023   Radiology Reports  DG Chest Port 1 View Result Date: 06/27/2023 CLINICAL DATA:  Shortness of breath. EXAM: PORTABLE CHEST 1 VIEW COMPARISON:  06/23/2023.  FINDINGS: The heart is borderline enlarged and mediastinal contours are within normal limits. The pulmonary vasculature is mildly distended. There is atherosclerotic calcification of  the aorta. Mild hazy airspace opacities are noted bilaterally with a basilar predominance, slightly improved from the prior exam. There is a small left pleural effusion. No pneumothorax is seen. No acute osseous abnormality. IMPRESSION: 1. Hazy airspace opacities bilaterally, slightly improved from the prior exam. 2. Small left pleural effusion. Electronically Signed   By: Thornell Sartorius M.D.   On: 06/27/2023 07:10    Signature  -   Susa Raring M.D on 06/28/2023 at 9:36 AM   -  To page go to [email or web address]

## 2023-06-28 NOTE — Plan of Care (Signed)

## 2023-06-28 NOTE — Plan of Care (Signed)
 Problem: Education: Goal: Knowledge of General Education information will improve Description: Including pain rating scale, medication(s)/side effects and non-pharmacologic comfort measures Outcome: Progressing   Problem: Health Behavior/Discharge Planning: Goal: Ability to manage health-related needs will improve Outcome: Progressing   Problem: Clinical Measurements: Goal: Ability to maintain clinical measurements within normal limits will improve Outcome: Progressing Goal: Will remain free from infection Outcome: Progressing Goal: Diagnostic test results will improve Outcome: Progressing Goal: Respiratory complications will improve Outcome: Progressing Goal: Cardiovascular complication will be avoided Outcome: Progressing   Problem: Activity: Goal: Risk for activity intolerance will decrease Outcome: Progressing   Problem: Nutrition: Goal: Adequate nutrition will be maintained Outcome: Progressing   Problem: Coping: Goal: Level of anxiety will decrease Outcome: Progressing   Problem: Elimination: Goal: Will not experience complications related to bowel motility Outcome: Progressing Goal: Will not experience complications related to urinary retention Outcome: Progressing   Problem: Pain Managment: Goal: General experience of comfort will improve and/or be controlled Outcome: Progressing   Problem: Safety: Goal: Ability to remain free from injury will improve Outcome: Progressing   Problem: Skin Integrity: Goal: Risk for impaired skin integrity will decrease Outcome: Progressing   Problem: Education: Goal: Ability to describe self-care measures that may prevent or decrease complications (Diabetes Survival Skills Education) will improve Outcome: Progressing Goal: Individualized Educational Video(s) Outcome: Progressing   Problem: Coping: Goal: Ability to adjust to condition or change in health will improve Outcome: Progressing   Problem: Fluid  Volume: Goal: Ability to maintain a balanced intake and output will improve Outcome: Progressing   Problem: Health Behavior/Discharge Planning: Goal: Ability to identify and utilize available resources and services will improve Outcome: Progressing Goal: Ability to manage health-related needs will improve Outcome: Progressing   Problem: Metabolic: Goal: Ability to maintain appropriate glucose levels will improve Outcome: Progressing   Problem: Metabolic: Goal: Ability to maintain appropriate glucose levels will improve Outcome: Progressing   Problem: Nutritional: Goal: Maintenance of adequate nutrition will improve Outcome: Progressing Goal: Progress toward achieving an optimal weight will improve Outcome: Progressing   Problem: Skin Integrity: Goal: Risk for impaired skin integrity will decrease Outcome: Progressing   Problem: Tissue Perfusion: Goal: Adequacy of tissue perfusion will improve Outcome: Progressing   Problem: Education: Goal: Knowledge of disease or condition will improve Outcome: Progressing Goal: Knowledge of secondary prevention will improve (MUST DOCUMENT ALL) Outcome: Progressing Goal: Knowledge of patient specific risk factors will improve (DELETE if not current risk factor) Outcome: Progressing   Problem: Ischemic Stroke/TIA Tissue Perfusion: Goal: Complications of ischemic stroke/TIA will be minimized Outcome: Progressing   Problem: Coping: Goal: Will verbalize positive feelings about self Outcome: Progressing Goal: Will identify appropriate support needs Outcome: Progressing   Problem: Health Behavior/Discharge Planning: Goal: Ability to manage health-related needs will improve Outcome: Progressing Goal: Goals will be collaboratively established with patient/family Outcome: Progressing   Problem: Self-Care: Goal: Ability to participate in self-care as condition permits will improve Outcome: Progressing Goal: Verbalization of feelings  and concerns over difficulty with self-care will improve Outcome: Progressing Goal: Ability to communicate needs accurately will improve Outcome: Progressing   Problem: Nutrition: Goal: Risk of aspiration will decrease Outcome: Progressing Goal: Dietary intake will improve Outcome: Progressing   Problem: Clinical Measurements: Goal: Ability to avoid or minimize complications of infection will improve Outcome: Progressing   Problem: Skin Integrity: Goal: Skin integrity will improve Outcome: Progressing   Problem: Education: Goal: Knowledge of the prescribed therapeutic regimen will improve Outcome: Progressing Goal: Ability to verbalize activity precautions or restrictions will  improve Outcome: Progressing Goal: Understanding of discharge needs will improve Outcome: Progressing   Problem: Activity: Goal: Ability to perform//tolerate increased activity and mobilize with assistive devices will improve Outcome: Progressing   Problem: Clinical Measurements: Goal: Postoperative complications will be avoided or minimized Outcome: Progressing

## 2023-06-29 DIAGNOSIS — E1100 Type 2 diabetes mellitus with hyperosmolarity without nonketotic hyperglycemic-hyperosmolar coma (NKHHC): Secondary | ICD-10-CM | POA: Diagnosis not present

## 2023-06-29 LAB — BASIC METABOLIC PANEL
Anion gap: 7 (ref 5–15)
BUN: 20 mg/dL (ref 8–23)
CO2: 23 mmol/L (ref 22–32)
Calcium: 8.1 mg/dL — ABNORMAL LOW (ref 8.9–10.3)
Chloride: 106 mmol/L (ref 98–111)
Creatinine, Ser: 1.07 mg/dL (ref 0.61–1.24)
GFR, Estimated: >60 mL/min (ref >60)
Glucose, Bld: 187 mg/dL — ABNORMAL HIGH (ref 70–99)
Potassium: 4.5 mmol/L (ref 3.5–5.1)
Sodium: 136 mmol/L (ref 135–145)

## 2023-06-29 LAB — CBC WITH DIFFERENTIAL/PLATELET
Abs Immature Granulocytes: 0.04 10*3/uL (ref 0.00–0.07)
Basophils Absolute: 0 10*3/uL (ref 0.0–0.1)
Basophils Relative: 1 %
Eosinophils Absolute: 0 10*3/uL (ref 0.0–0.5)
Eosinophils Relative: 1 %
HCT: 28.1 % — ABNORMAL LOW (ref 39.0–52.0)
Hemoglobin: 9.1 g/dL — ABNORMAL LOW (ref 13.0–17.0)
Immature Granulocytes: 1 %
Lymphocytes Relative: 12 %
Lymphs Abs: 1.1 10*3/uL (ref 0.7–4.0)
MCH: 27.6 pg (ref 26.0–34.0)
MCHC: 32.4 g/dL (ref 30.0–36.0)
MCV: 85.2 fL (ref 80.0–100.0)
Monocytes Absolute: 0.6 10*3/uL (ref 0.1–1.0)
Monocytes Relative: 7 %
Neutro Abs: 6.8 10*3/uL (ref 1.7–7.7)
Neutrophils Relative %: 78 %
Platelets: 213 10*3/uL (ref 150–400)
RBC: 3.3 MIL/uL — ABNORMAL LOW (ref 4.22–5.81)
RDW: 20 % — ABNORMAL HIGH (ref 11.5–15.5)
WBC: 8.6 10*3/uL (ref 4.0–10.5)
nRBC: 0 % (ref 0.0–0.2)

## 2023-06-29 LAB — BRAIN NATRIURETIC PEPTIDE: B Natriuretic Peptide: 3373.6 pg/mL — ABNORMAL HIGH (ref 0.0–100.0)

## 2023-06-29 LAB — C-REACTIVE PROTEIN: CRP: 1.3 mg/dL — ABNORMAL HIGH (ref <1.0)

## 2023-06-29 LAB — GLUCOSE, CAPILLARY
Glucose-Capillary: 170 mg/dL — ABNORMAL HIGH (ref 70–99)
Glucose-Capillary: 145 mg/dL — ABNORMAL HIGH (ref 70–99)

## 2023-06-29 LAB — DIGOXIN LEVEL: Digoxin Level: 0.8 ng/mL (ref 0.8–2.0)

## 2023-06-29 LAB — MAGNESIUM: Magnesium: 1.8 mg/dL (ref 1.7–2.4)

## 2023-06-29 LAB — PROCALCITONIN: Procalcitonin: 1.19 ng/mL

## 2023-06-29 MED ORDER — METOPROLOL SUCCINATE ER 50 MG PO TB24
50.0000 mg | ORAL_TABLET | Freq: Every day | ORAL | Status: DC
  Administered 2023-06-29: 50 mg via ORAL
  Filled 2023-06-29: qty 1

## 2023-06-29 MED ORDER — FUROSEMIDE 20 MG PO TABS: 20.0000 mg | ORAL_TABLET | Freq: Every day | ORAL | Status: DC

## 2023-06-29 MED ORDER — FUROSEMIDE 10 MG/ML IJ SOLN: 40.0000 mg | Freq: Once | INTRAMUSCULAR | Status: DC

## 2023-06-29 MED ORDER — APIXABAN 5 MG PO TABS
5.0000 mg | ORAL_TABLET | Freq: Two times a day (BID) | ORAL | Status: DC
Start: 2023-06-29 — End: 2023-09-11

## 2023-06-29 MED ORDER — AMOXICILLIN-POT CLAVULANATE 875-125 MG PO TABS: 1.0000 | ORAL_TABLET | Freq: Two times a day (BID) | ORAL | Status: DC

## 2023-06-29 MED ORDER — INSULIN ASPART 100 UNIT/ML FLEXPEN: PEN_INJECTOR | SUBCUTANEOUS | Status: DC

## 2023-06-29 MED ORDER — FUROSEMIDE 10 MG/ML IJ SOLN
60.0000 mg | Freq: Once | INTRAMUSCULAR | Status: AC
  Administered 2023-06-29: 60 mg via INTRAVENOUS
  Filled 2023-06-29: qty 6

## 2023-06-29 MED ORDER — DIGOXIN 0.25 MG/ML IJ SOLN
0.2500 mg | Freq: Once | INTRAMUSCULAR | Status: AC
  Administered 2023-06-29: 0.25 mg via INTRAVENOUS
  Filled 2023-06-29: qty 1

## 2023-06-29 MED ORDER — MAGNESIUM SULFATE 2 GM/50ML IV SOLN
2.0000 g | Freq: Once | INTRAVENOUS | Status: AC
  Administered 2023-06-29: 2 g via INTRAVENOUS
  Filled 2023-06-29: qty 50

## 2023-06-29 MED ORDER — LOPERAMIDE HCL 2 MG PO CAPS: 4.0000 mg | ORAL_CAPSULE | Freq: Four times a day (QID) | ORAL | Status: DC | PRN

## 2023-06-29 MED ORDER — ATORVASTATIN CALCIUM 40 MG PO TABS: 80.0000 mg | ORAL_TABLET | Freq: Every day | ORAL | Status: DC

## 2023-06-29 NOTE — Plan of Care (Signed)

## 2023-06-29 NOTE — Plan of Care (Cosign Needed)
 I assumed care of Mr. Dearis Danis at around 0800 today with Joline Maxcy, RN. Patient is AAO x4 and follows commands well. He is able to tolerate physical activity with assistance and uses a front wheel walker. He has clear lung sounds and normal respirations. He had diarrhea and had three bowel movements in his bed, the first time being around 1000, the second time around 1130, and the third time around 1400. Bowel movement characteristics presented as diarrhea and were brown. I changed his sheets and performed peri care with the help of Nya Ward who is a Theatre stage manager. RN made aware of the accidents. I administered his 1000 medications. Those medications were Amoxicillin 125 mg, apixaban 5 mg, vitamin C 1000 mg, atorvastatin 80 mg, insulin glargine injection 12 units, multivitamin 1 tablet, juven powder supplement 1 packet, protonix EC 40 mg, sodium bicarbonate 650 mg, flomax 0.4 mg, and zinc sulfate 220 mg. Before the patient was discharged, I removed his IV and his purewick urinary catheter. Patient was discharged to [facility] around 1345.

## 2023-06-29 NOTE — Plan of Care (Signed)
 Problem: Education: Goal: Knowledge of General Education information will improve Description: Including pain rating scale, medication(s)/side effects and non-pharmacologic comfort measures Outcome: Completed/Met   Problem: Health Behavior/Discharge Planning: Goal: Ability to manage health-related needs will improve Outcome: Completed/Met   Problem: Clinical Measurements: Goal: Ability to maintain clinical measurements within normal limits will improve Outcome: Completed/Met Goal: Will remain free from infection Outcome: Completed/Met Goal: Diagnostic test results will improve Outcome: Completed/Met Goal: Respiratory complications will improve Outcome: Completed/Met Goal: Cardiovascular complication will be avoided Outcome: Completed/Met   Problem: Activity: Goal: Risk for activity intolerance will decrease Outcome: Completed/Met   Problem: Nutrition: Goal: Adequate nutrition will be maintained Outcome: Completed/Met   Problem: Coping: Goal: Level of anxiety will decrease Outcome: Completed/Met   Problem: Elimination: Goal: Will not experience complications related to bowel motility Outcome: Completed/Met Goal: Will not experience complications related to urinary retention Outcome: Completed/Met   Problem: Pain Managment: Goal: General experience of comfort will improve and/or be controlled Outcome: Completed/Met   Problem: Safety: Goal: Ability to remain free from injury will improve Outcome: Completed/Met   Problem: Skin Integrity: Goal: Risk for impaired skin integrity will decrease Outcome: Completed/Met   Problem: Education: Goal: Ability to describe self-care measures that may prevent or decrease complications (Diabetes Survival Skills Education) will improve Outcome: Completed/Met Goal: Individualized Educational Video(s) Outcome: Completed/Met   Problem: Coping: Goal: Ability to adjust to condition or change in health will improve Outcome:  Completed/Met   Problem: Fluid Volume: Goal: Ability to maintain a balanced intake and output will improve Outcome: Completed/Met   Problem: Health Behavior/Discharge Planning: Goal: Ability to identify and utilize available resources and services will improve Outcome: Completed/Met Goal: Ability to manage health-related needs will improve Outcome: Completed/Met   Problem: Metabolic: Goal: Ability to maintain appropriate glucose levels will improve Outcome: Completed/Met   Problem: Nutritional: Goal: Maintenance of adequate nutrition will improve Outcome: Completed/Met Goal: Progress toward achieving an optimal weight will improve Outcome: Completed/Met   Problem: Skin Integrity: Goal: Risk for impaired skin integrity will decrease Outcome: Completed/Met   Problem: Tissue Perfusion: Goal: Adequacy of tissue perfusion will improve Outcome: Completed/Met   Problem: Education: Goal: Knowledge of disease or condition will improve Outcome: Completed/Met Goal: Knowledge of secondary prevention will improve (MUST DOCUMENT ALL) Outcome: Completed/Met Goal: Knowledge of patient specific risk factors will improve (DELETE if not current risk factor) Outcome: Completed/Met   Problem: Ischemic Stroke/TIA Tissue Perfusion: Goal: Complications of ischemic stroke/TIA will be minimized Outcome: Completed/Met   Problem: Coping: Goal: Will verbalize positive feelings about self Outcome: Completed/Met Goal: Will identify appropriate support needs Outcome: Completed/Met   Problem: Health Behavior/Discharge Planning: Goal: Ability to manage health-related needs will improve Outcome: Completed/Met Goal: Goals will be collaboratively established with patient/family Outcome: Completed/Met   Problem: Self-Care: Goal: Ability to participate in self-care as condition permits will improve Outcome: Completed/Met Goal: Verbalization of feelings and concerns over difficulty with self-care  will improve Outcome: Completed/Met Goal: Ability to communicate needs accurately will improve Outcome: Completed/Met   Problem: Nutrition: Goal: Risk of aspiration will decrease Outcome: Completed/Met Goal: Dietary intake will improve Outcome: Completed/Met   Problem: Clinical Measurements: Goal: Ability to avoid or minimize complications of infection will improve Outcome: Completed/Met   Problem: Skin Integrity: Goal: Skin integrity will improve Outcome: Completed/Met   Problem: Education: Goal: Knowledge of the prescribed therapeutic regimen will improve Outcome: Completed/Met Goal: Ability to verbalize activity precautions or restrictions will improve Outcome: Completed/Met Goal: Understanding of discharge needs will improve Outcome: Completed/Met   Problem:  Activity: Goal: Ability to perform//tolerate increased activity and mobilize with assistive devices will improve Outcome: Completed/Met   Problem: Clinical Measurements: Goal: Postoperative complications will be avoided or minimized Outcome: Completed/Met

## 2023-06-29 NOTE — Progress Notes (Signed)
 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 PMH: poorly controlled DM2, chronic diastolic CHF, chronic R sided heart failure, anemia of chronic disease, L bundle branch block   OT comments  Pt presented in bed and agreeable to session but was limited due loose BMS. As when pt attempted to complete transfer at EOB started to have BM and was unaware. He required min x2  with assist with transfers to [facility] and mod assist with RLE to follow WB precautions as pt will attempt to place LE onto the floor to support self. Pt needs CGA with UE dressing and max assist with LB dressing. Patient will benefit from continued inpatient follow up therapy, <3 hours/day.      If plan is discharge home, recommend the following:  A lot of help with walking and/or transfers;A lot of help with bathing/dressing/bathroom;Assistance with cooking/housework;Direct supervision/assist for medications management;Direct supervision/assist for financial management;Assist for transportation;Help with stairs or ramp for entrance   Equipment Recommendations  Other (comment) (TBD next level of care)    Recommendations for Other Services      Precautions / Restrictions Precautions Precautions: Fall Recall of Precautions/Restrictions: Impaired Precaution/Restrictions Comments: Incontinent of bowels Restrictions Weight Bearing Restrictions Per Provider Order: Yes RLE Weight Bearing Per Provider Order: Touchdown weight bearing Other Position/Activity Restrictions: wear post op shoe/boot when performing functional mobility       Mobility Bed Mobility Overal bed mobility: Needs Assistance Bed Mobility: Sit to Supine      Supine to sit: Contact guard          Transfers Overall transfer level: Needs assistance Equipment used: Rolling walker (2 wheels) Transfers: Sit to/from Stand Sit to Stand: Min assist, +2 physical assistance, +2 safety/equipment     Step pivot transfers: +2 physical assistance, +2 safety/equipment, Min assist     General transfer comment: pt needs x2 as does not follow WB precautions unless physically assisting LE with transfers     Balance Overall balance assessment: Needs assistance Sitting-balance support: Bilateral upper extremity supported, Feet supported Sitting balance-Leahy Scale: Fair Sitting balance - Comments: static sitting EOB   Standing balance support: Bilateral upper extremity supported Standing balance-Leahy Scale: Poor Standing balance comment: Reliant on RW for support. Difficulty maintaining balance when adhering to RLE WB precautions                           ADL either performed or assessed with clinical judgement   ADL Overall ADL's : Needs assistance/impaired Eating/Feeding: Set up   Grooming: Minimal assistance;Standing;Wash/dry face Grooming Details (indicate cue type and reason): while sitting Upper Body Bathing: Contact guard assist;Sitting Upper Body Bathing Details (indicate cue type and reason): needs assist with intiation Lower Body Bathing: Maximal assistance;Cueing for safety;Cueing for sequencing;Sit to/from stand   Upper Body Dressing : Contact guard assist;Cueing for safety;Cueing for sequencing;Sitting   Lower Body Dressing: Maximal assistance;Cueing for safety;Cueing for sequencing   Toilet Transfer: Minimal assistance;+2 for physical assistance;+2 for safety/equipment;Adhering to hip precautions;Cueing for safety;Cueing for sequencing;Rolling walker (2 wheels);BSC/3in1   Toileting- Clothing Manipulation and Hygiene: Total assistance;Sit to/from stand       Functional mobility during ADLs: Minimal assistance;+2  for physical assistance;+2  for safety/equipment;Rolling walker (2 wheels);Cueing for sequencing;Cueing for safety      Extremity/Trunk Assessment Upper Extremity Assessment Upper Extremity Assessment: Left hand dominant LUE Deficits / Details: Impaired proprioception, FM, and gross motor coodination LUE Sensation: decreased light touch;decreased proprioception LUE Coordination: decreased fine motor;decreased gross motor   Lower Extremity Assessment Lower Extremity Assessment: Defer to PT evaluation        Vision   Vision Assessment?: Wears glasses for reading;Wears glasses for driving   Perception Perception Perception: Impaired Preception Impairment Details: Spatial orientation   Praxis     Communication Communication Communication: Impaired Factors Affecting Communication: Reduced clarity of speech   Cognition Arousal: Alert Behavior During Therapy: Flat affect Cognition: Cognition impaired   Orientation impairments: Situation Awareness: Intellectual awareness intact, Online awareness impaired Memory impairment (select all impairments): Short-term memory, Working memory Attention impairment (select first level of impairment): Sustained attention Executive functioning impairment (select all impairments): Initiation, Organization, Sequencing, Problem solving OT - Cognition Comments: Pt can not recall WB precaution unless keeping LE up/tactile cue                 Following commands: Impaired Following commands impaired: Follows one step commands inconsistently      Cueing   Cueing Techniques: Verbal cues, Tactile cues  Exercises      Shoulder Instructions       General Comments      Pertinent Vitals/ Pain       Pain Assessment Pain Assessment: No/denies pain  Home Living                                          Prior Functioning/Environment              Frequency  Min 1X/week        Progress Toward Goals  OT  Goals(current goals can now be found in the care plan section)  Progress towards OT goals: Progressing toward goals  Acute Rehab OT Goals Patient Stated Goal: none OT Goal Formulation: With patient Time For Goal Achievement: 07/11/23 Potential to Achieve Goals: Good ADL Goals Pt Will Perform Upper Body Dressing: with supervision;sitting Pt Will Perform Lower Body Dressing: with min assist;sitting/lateral leans;sit to/from stand Pt Will Transfer to Toilet: with min assist;ambulating;regular height toilet Pt Will Perform Tub/Shower Transfer: Tub transfer;Shower transfer;with supervision;ambulating;shower seat Additional ADL Goal #1: pt will perform x3 standing ADLs with min cues for WB precautions  Plan      Co-evaluation                 AM-PAC OT "6 Clicks" Daily Activity     Outcome Measure   Help from another person eating meals?: A Little Help from another person taking care of personal grooming?: A Little Help from another person toileting, which includes using toliet, bedpan, or urinal?: A Lot Help from another person bathing (including washing, rinsing, drying)?: A Lot Help from another person to put on and taking off regular upper body clothing?: A Little Help from another person to put on and taking off regular lower body clothing?: A Lot 6 Click Score: 15    End of Session Equipment Utilized During Treatment: Gait belt;Rolling walker (2 wheels)  OT Visit Diagnosis: Unsteadiness on feet (R26.81);Other abnormalities of gait and mobility (R26.89);Muscle weakness (generalized) (M62.81)   Activity Tolerance Patient tolerated treatment well   Patient Left in chair;with call bell/phone within  reach;with chair alarm set   Nurse Communication Mobility status        Time: 4098-1191 OT Time Calculation (min): 32 min  Charges: OT General Charges $OT Visit: 1 Visit OT Treatments $Self Care/Home Management : 23-37 mins  Presley Raddle OTR/L  Acute Rehab Services   [phone] office number   Alphia Moh 06/29/2023, 11:06 AM

## 2023-06-29 NOTE — Progress Notes (Signed)
 Pt went to bathroom with 1 assist using a walker. Advised to set-up in the recliner ready for his meal in a while but opted to go back to bed for the meantime.

## 2023-06-29 NOTE — TOC Progression Note (Signed)
 Transition of Care [facility]) - Progression Note    Patient Details  Name: Derek Thompson MRN: [id number] Date of Birth: Dec 30, 1960  [facility] CM/SW Contact  Mearl Latin, LCSW Phone Number: 06/29/2023, 8:50 AM  Clinical Narrative:    8:50 AM-Per [facility], they gave patient's bed away and are seeing what patients they can move around.    Expected Discharge Plan: Skilled Nursing Facility Barriers to Discharge: Other (must enter comment) (SNF gave bed away)  Expected Discharge Plan and Services In-house Referral: Clinical Social Work   Post Acute Care Choice: Skilled Nursing Facility Living arrangements for the past 2 months: Hotel/Motel Expected Discharge Date: 06/29/23                                     Social Determinants of Health (SDOH) Interventions SDOH Screenings   Food Insecurity: No Food Insecurity (06/14/2023)  Housing: Low Risk  (06/14/2023)  Transportation Needs: No Transportation Needs (06/14/2023)  Utilities: Not At Risk (06/14/2023)  Alcohol Screen: Low Risk  (08/19/2022)  Depression (PHQ2-9): Medium Risk (11/22/2022)  Financial Resource Strain: Low Risk  (08/19/2022)  Tobacco Use: Medium Risk (06/21/2023)    Readmission Risk Interventions     No data to display

## 2023-06-29 NOTE — Discharge Summary (Addendum)
 Derek Thompson LOV:[id number] DOB: 13-Aug-1960 DOA: 06/13/2023  PCP: Rana Snare, DO  Admit date: 06/13/2023  Discharge date: 06/29/2023  Admitted From: Home   Disposition:  SNF   Recommendations for Outpatient Follow-up:   Follow up with PCP in 1-2 weeks  PCP Please obtain BMP/CBC, 2 view CXR in 1week,  (see Discharge instructions)   PCP Please follow up on the following pending results: Monitor BMP closely, must follow-up with his cardiologist and orthopedic surgeon within 7 to 10 days.  Check CBC, TSH and free T4 in 2 to 3 weeks, check BMP and magnesium in 5 to 7 days    Home Health: None   Equipment/Devices: None  Consultations: Cards, Ortho, neurology, PCCM Discharge Condition: Stable    CODE STATUS: Full    Diet Recommendation: Heart healthy-low carbohydrate diet, 1.5 L fluid restriction per day, check CBGs q. [facility] S    Chief Complaint  Patient presents with   Hyperglycemia   Weakness     Brief history of present illness from the day of admission and additional interim summary    63 y.o.  male with history of chronic HFrEF, DM-2, HTN, prostate cancer-s/p radiation 2022-who presented with generalized weakness/acute metabolic encephalopathy-he was found to have HHS secondary to right diabetic foot (great/second toe) with gangrene/osteomyelitis, LV thrombus and acute CVA.  He was stabilized in the ICU-and subsequently transferred to [facility] on 2/16.   Significant events: 2/12>> admit to [facility]-HHS/encephalopathy/diabetic foot-developed hypotension-started on Levophed-transfer to ICU. 2/14>> RLE angiogram-small vessel disease. 2/16>> transferred to [facility].   Significant studies: 2/11>> A1c: 13.3 2/12>> MRI brain: Acute infarct right frontal lobe, additional small infarct right cerebellum, left  frontal/parietal/occipital lobe. 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 2/12>> renal ultrasound: No hydronephrosis 2/12>> echo: EF 20-25%, LV thrombus.  Grade 2 diastolic dysfunction.  Moderate mitral valve regurgitation 2/13>> LDL: 49.   Significant microbiology data: 2/11>> COVID/influenza/RSV PCR: Negative 2/12>> blood culture: Negative. 2/19>> intraoperative cultures/right foot: Rare Staph aureus   Procedures: 2/14>> RLE angiogram by vascular surgery-small vessel disease. 2/19>> right first and second ray amputation.                                                                 Hospital Course   Septic shock secondary to right diabetic foot with wet gangrene/osteomyelitis involving great/second toe-s/p first and second ray amputation on 2/19 Sepsis physiology has improved-however slight worsening leukocytosis today He was initially on Zyvox-Augmentin initially now only on Augmentin after discussions with ID Dr. Ilsa Iha on 06/26/2023, stop date 07/14/2023.  Case also discussed with Dr. Lajoyce Corners.  Daily dry dressing to his right foot amputation site, follow-up with Dr. Lajoyce Corners within 5 to 7 days of discharge.     Acute metabolic encephalopathy Suspect this  is mostly from sepsis-with some component from CVA, completely resolved back to baseline   Acute embolic CVA Likely secondary to LV thrombus in the setting of severely reduced LVEF Now on Eliquis, close to his baseline Nonfocal exam, seen by neurology.   Acute on chronic HFrEF EF 20 to 25%, seen by cardiology here, currently on low-dose beta-blocker, low-dose Lasix along with digoxin currently compensated after diuresis, blood pressure too low to add ACE/ARB/Entresto also had recent AKI.  He was initially noncompliant with digoxin but now says he will take it.  Postdischarge he must follow-up with his cardiologist within 1 to 2 weeks.   LV thrombus Initially on  heparin drip now on Eliquis.  Seen by cardiology, needs to follow-up with his cardiologist within 1 to 2 weeks for adjustment of his CHF medications, Eliquis adjustment for dose and duration.  Possible repeat echocardiogram.   AKI Likely hemodynamically mediated Hold Entresto and Aldactone, Lasix dose was adjusted.  Renal function is back to baseline   Hypomagnesemia.  Replaced.     HTN BP soft Low-dose beta-blocker and Lasix as tolerated   Normocytic anemia Secondary to critical illness No evidence of blood loss S/p 1 unit of PRBC on 2/17-Hb currently stable.  Repeat CBC at SNF in 1 to 2 weeks   Thrombocytopenia Likely due to sepsis physiology Slowly improving Repeat CBC at SNF and 1 to 2 weeks   Sick euthyroid syndrome TSH/free T4 mildly elevated Repeat thyroid function test in 4 to 6 weeks when he has recovered from acute illness.   DM-2 with poor control/uncontrolled hyperglycemia (A1c 13.3 on 2/11) On long-acting insulin along with sliding scale, check CBGs q. [facility] S    Discharge diagnosis     Principal Problem:   Hyperosmolar hyperglycemic state (HHS) (HCC) Active Problems:   Chronic HFrEF (heart failure with reduced ejection fraction) (HCC)   DM2 (diabetes mellitus, type 2) (HCC)   Generalized weakness   Acute metabolic encephalopathy   SIRS (systemic inflammatory response syndrome) (HCC)   High anion gap metabolic acidosis   Lactic acidosis   AKI (acute kidney injury) (HCC)   Acute on chronic anemia   Hypomagnesemia   Hyperglycemia   Septic shock (HCC)   Depressed left ventricular ejection fraction   Chronic osteomyelitis of toe, right (HCC)   Cutaneous abscess of right foot   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 Assuming due to hypoglycemia so discontinued use. A1C 13%   Expected date of contact: Emergent - 3 Days   Service: Pharmacy   Pharmacy Service For: Disease Management   Disease states to manage: Diabetes   Ambulatory Referral for Lung Cancer Scre   Complete by: As directed    Change dressing   Complete by: As directed    Remove the current dressing and apply a dry dressing at time of discharge.   Discharge instructions   Complete by: As directed    Follow with Primary MD Rana Snare, DO in 7 days must follow-up with Dr. Lajoyce Corners orthopedics and Dr. Rennis Golden cardiology within  the next 1 to 2 weeks of discharge.  Get CBC, CMP, Magnesium, 2 view Chest X ray -  checked next visit with your primary MD or SNF MD    Activity: As tolerated with Full fall precautions use walker/cane & assistance as needed  Disposition SNF  Diet: Heart Healthy low carbohydrate diet with strict 1.5 L fluid restriction.  Check CBGs q. [facility].  Special Instructions: If you have smoked or chewed Tobacco  in the last 2 yrs please stop smoking, stop any regular Alcohol  and or any Recreational drug use.  On your next visit with your primary care physician please Get Medicines reviewed and adjusted.  Please request your Prim.MD to go over all Hospital Tests and Procedure/Radiological results at the follow up, please get all Hospital records sent to your Prim MD by signing hospital release before you go home.  If you experience worsening of your admission symptoms, develop shortness of breath, life threatening emergency, suicidal or homicidal thoughts you must seek medical attention immediately by calling 911 or calling your MD immediately  if symptoms less severe.  You Must read complete instructions/literature along with all  the possible adverse reactions/side effects for all the Medicines you take and that have been prescribed to you. Take any new Medicines after you have completely understood and accpet all the possible adverse reactions/side effects.   Do not drive when taking Pain medications.  Do not take more than prescribed Pain, Sleep and Anxiety Medications   Discharge wound care:   Complete by: As directed    Daily dry dressing of the foot postop site, TED stockings thigh-high to both legs.  Follow-up with Dr. Lajoyce Corners within 5 to 7 days.   Increase activity slowly   Complete by: As directed        Discharge Medications   Allergies as of 06/29/2023   No Known Allergies      Medication List     STOP taking these medications    aspirin EC 81 MG tablet       TAKE these medications    Accu-Chek Softclix Lancets lancets SMARTSIG:Topical 1-4 Times Daily   amoxicillin-clavulanate 875-125 MG tablet Commonly known as: AUGMENTIN Take 1 tablet by mouth every 12 (twelve) hours.   apixaban 5 MG Tabs tablet Commonly known as: ELIQUIS Take 1 tablet (5 mg total) by mouth 2 (two) times daily.   atorvastatin 40 MG tablet Commonly known as: LIPITOR Take 2 tablets (80 mg total) by mouth daily. What changed: how much to take   blood glucose meter kit and supplies Kit Dispense based on patient and insurance preference. Use up to four times daily as directed. (FOR ICD-9 250.00, 250.01).   Contour Next Test test strip Generic drug: glucose blood Use to check blood sugar up to 3 times daily as instructed   digoxin 0.125 MG tablet Commonly known as: LANOXIN Take 1 tablet (0.125 mg total) by mouth daily.   ezetimibe 10 MG tablet Commonly known as: ZETIA Take 1 tablet (10 mg total) by mouth daily.   ferrous sulfate 325 (65 FE) MG tablet TAKE 1 TABLET BY MOUTH EVERY DAY   furosemide 20 MG tablet Commonly known as: Lasix Take 1 tablet (20 mg total) by mouth daily.   gabapentin 300 MG  capsule Commonly known as: NEURONTIN TAKE 1 CAPSULE BY MOUTH TWICE A DAY   insulin aspart 100 UNIT/ML FlexPen Commonly known as: NOVOLOG Before each meal 3 times a day, 140-199 - 2 units, 200-250 -  4 units, 251-299 - 6 units,  300-349 - 8 units,  350 or above 10 units.   Lantus SoloStar 100 UNIT/ML Solostar Pen Generic drug: insulin glargine INJECT 16 UNITS INTO THE SKIN AT BEDTIME.   loperamide 2 MG capsule Commonly known as: IMODIUM Take 2 capsules (4 mg total) by mouth every 6 (six) hours as needed for diarrhea or loose stools.   metoprolol succinate 25 MG 24 hr tablet Commonly known as: TOPROL-XL TAKE 1 TABLET BY MOUTH DAILY   multivitamin tablet Take 1 tablet by mouth daily.   spironolactone 25 MG tablet Commonly known as: ALDACTONE Take 1 tablet (25 mg total) by mouth daily.   tamsulosin 0.4 MG Caps capsule Commonly known as: FLOMAX TAKE 1 CAPSULE BY MOUTH EVERYDAY AT BEDTIME               Discharge Care Instructions  (From admission, onward)           Start     Ordered   06/29/23 0000  Discharge wound care:       Comments: Daily dry dressing of the foot postop site, TED stockings thigh-high to both legs.  Follow-up with Dr. Lajoyce Corners within 5 to 7 days.   06/29/23 0804   06/24/23 0000  Change dressing       Comments: Remove the current dressing and apply a dry dressing at time of discharge.   06/24/23 0900             Contact information for follow-up providers     Nadara Mustard, MD Follow up in 1 week(s).   Specialty: Orthopedic Surgery Contact information: [address] [name] [address] [phone]         Rana Snare, DO. Schedule an appointment as soon as possible for a visit in 1 week(s).   Specialty: Internal Medicine Contact information: [address] [name] [address] [phone]         [name], MD. Schedule an appointment as soon as possible for a visit in 1 week(s).   Specialty:  Cardiology Contact information: [address] [place] [address] [name] [address] [phone]              Contact information for after-discharge care     Destination     [facility] Preferred SNF .   Service: Skilled Nursing Contact information: [address] [place] [address] [phone]                     Major procedures and Radiology Reports - PLEASE review detailed and final reports thoroughly  -     DG Chest [facility] 1 View Result Date: 06/27/2023 CLINICAL DATA:  Shortness of breath. EXAM: PORTABLE CHEST 1 VIEW COMPARISON:  06/23/2023. FINDINGS: The heart is borderline enlarged and mediastinal contours are within normal limits. The pulmonary vasculature is mildly distended. There is atherosclerotic calcification of the aorta. Mild hazy airspace opacities are noted bilaterally with a basilar predominance, slightly improved from the prior exam. There is a small left pleural effusion. No pneumothorax is seen. No acute osseous abnormality. IMPRESSION: 1. Hazy airspace opacities bilaterally, slightly improved from the prior exam. 2. Small left pleural effusion. Electronically Signed   By: Thornell Sartorius M.D.   On: 06/27/2023 07:10   DG CHEST PORT 1 VIEW Result Date: 06/23/2023 CLINICAL DATA:  63 year old male with congestive heart failure. EXAM: PORTABLE CHEST 1 VIEW COMPARISON:  Portable chest 06/14/2023 and earlier. FINDINGS: Portable AP semi  upright view at 0616 hours. Left IJ approach central line removed. Stable lung volumes. Stable mild cardiomegaly. Visualized tracheal air column is within normal limits. Unresolved pulmonary vascular congestion with basilar predominance bilaterally. No pneumothorax. No consolidation. Trace fluid in the right minor fissure. No layering effusions are evident. Ventilation has not significantly changed. No acute osseous abnormality identified. Negative visible bowel gas. IMPRESSION: Pulmonary edema without  significant change since 06/14/2023. Electronically Signed   By: Odessa Fleming M.D.   On: 06/23/2023 06:38   PERIPHERAL VASCULAR CATHETERIZATION Result Date: 06/16/2023 Table formatting from the original result was not included. DATE OF SERVICE: 06/16/2023  PATIENT:  Derek Thompson  63 y.o. male  PRE-OPERATIVE DIAGNOSIS:  Atherosclerosis of native arteries of right lower extremity causing ulceration; diabetic foot infection  POST-OPERATIVE DIAGNOSIS:  Same  PROCEDURE:  1) Ultrasound guided left common femoral artery access 2) Aortogram 3) Right lower extremity angiogram with second order cannulation  SURGEON:  Rande Brunt. Lenell Antu, MD  ASSISTANT: none  ANESTHESIA:   local  ESTIMATED BLOOD LOSS: minimal  LOCAL MEDICATIONS USED:  LIDOCAINE  COUNTS: confirmed correct.  PATIENT DISPOSITION:  PACU - hemodynamically stable.  Delay start of Pharmacological VTE agent (>24hrs) due to surgical blood loss or risk of bleeding: no  INDICATION FOR PROCEDURE: Derek Thompson is a 63 y.o. male with right great toe ulceration and diabetic foot infection. After careful discussion of risks, benefits, and alternatives the patient was offered angiogram. The patient understood and wished to proceed.  OPERATIVE FINDINGS:  Left renal artery: patent Right renal artery: patent Infrarenal aorta: patent Left common iliac artery: patent Right common iliac artery: patent Left internal iliac artery: patent Right internal iliac artery: patent Left external iliac artery: patent Right external iliac artery: patent Left common femoral artery: not studied Right common femoral artery: patent Left profunda femoris artery: not studied Right profunda femoris artery: patent Left superficial femoral artery: not studied Right superficial femoral artery: patent; mild <50% stenosis in mid section Left popliteal artery: not studied Right popliteal artery: patent Left anterior tibial artery: not studied Right anterior tibial artery: patent Left tibioperoneal trunk: not  studied Right tibioperoneal trunk: patent Left peroneal artery: not studied Right peroneal artery: patent Left posterior tibial artery: not studied Right posterior tibial artery: patent Left pedal circulation: not studied Right pedal circulation: disadvantaged  GLASS score. N/A.  WIfI score. 2 / 0 / 2. Stage III.  DESCRIPTION OF PROCEDURE: After identification of the patient in the pre-operative holding area, the patient was transferred to the operating room. The patient was positioned supine on the operating room table. The groins was prepped and draped in standard fashion. A surgical pause was performed confirming correct patient, procedure, and operative location.  The left groin was anesthetized with subcutaneous injection of 1% lidocaine. Using ultrasound guidance, the left common femoral artery was accessed with micropuncture technique.  Fluoroscopy was used to confirm cannulation over the femoral head. The 61F micropuncture sheath was upsized to 86F.  A Benson wire was advanced into the distal aorta. Over the wire an omni flush catheter was advanced to the level of L2. Aortogram was performed - see above for details.  The right common iliac artery was selected with an omniflush catheter and benson guidewire. The wire was advanced into the common femoral artery. Over the wire the omni flush catheter was advanced into the external iliac artery. Selective angiography was performed - see above for details.  A mynx was used to close the arteriotomy. Hemostasis was  excellent upon completion.  Upon completion of the case instrument and sharps counts were confirmed correct. The patient was transferred to the PACU in good condition. I was present for all portions of the procedure.  PLAN: small vessel disease in foot is likely culprit for foot infection. No opportunities for reducing major amputation risk.  Rande Brunt. Lenell Antu, MD [facility] Phone Number: [phone] 06/16/2023  2:13 PM   VAS [place] ABI WITH/WO TBI Result Date: 06/16/2023  LOWER EXTREMITY DOPPLER STUDY Patient Name:  Derek Thompson  Date of Exam:   06/14/2023 Medical Rec #: [id number]      Accession #:    [id number] Date of Birth: 1960-08-19      Patient Gender: M Patient Age:   80 years Exam Location:  [facility] Procedure:      VAS [place] ABI WITH/WO TBI Referring Phys: MICHAEL JEFFERY --------------------------------------------------------------------------------  Indications: Ulceration, and gangrene. High Risk Factors: Diabetes, past history of smoking.  Comparison Study: No prior exam. Performing Technologist: Fernande Bras  Examination Guidelines: A complete evaluation includes at minimum, Doppler waveform signals and systolic blood pressure reading at the level of bilateral brachial, anterior tibial, and posterior tibial arteries, when vessel segments are accessible. Bilateral testing is considered an integral part of a complete examination. Photoelectric Plethysmograph (PPG) waveforms and toe systolic pressure readings are included as required and additional duplex testing as needed. Limited examinations for reoccurring indications may be performed as noted.  ABI Findings: +---------+------------------+-----+----------+----------------+ Right    Rt Pressure (mmHg)IndexWaveform  Comment          +---------+------------------+-----+----------+----------------+ Brachial 110                    triphasic                  +---------+------------------+-----+----------+----------------+ PTA      111               1.01 monophasic                 +---------+------------------+-----+----------+----------------+ DP       118               1.07 biphasic                   +---------+------------------+-----+----------+----------------+ Great Toe203               1.85 Abnormal  falsely elevated +---------+------------------+-----+----------+----------------+  +---------+------------------+-----+----------+-------+ Left     Lt Pressure (mmHg)IndexWaveform  Comment +---------+------------------+-----+----------+-------+ Brachial 110                    triphasic         +---------+------------------+-----+----------+-------+ PTA      116               1.05 biphasic          +---------+------------------+-----+----------+-------+ DP       109               0.99 monophasic        +---------+------------------+-----+----------+-------+ Great Toe71                0.65 Abnormal          +---------+------------------+-----+----------+-------+ +-------+-----------+-----------+------------+------------+ ABI/TBIToday's ABIToday's TBIPrevious ABIPrevious TBI +-------+-----------+-----------+------------+------------+ Right  1.07       1.85                                +-------+-----------+-----------+------------+------------+  Left   1.05       0.65                                +-------+-----------+-----------+------------+------------+  Arterial wall calcification precludes accurate ankle pressures and ABIs.  Summary: Waveforms are abnormal demonstrating some level of peripheral arterial disease. Likely falsely elevated. Right: Resting right ankle-brachial index is within normal range. The right toe-brachial index is abnormal. Left: Resting left ankle-brachial index is within normal range. The left toe-brachial index is abnormal. *See table(s) above for measurements and observations.  Electronically signed by Gerarda Fraction on 06/14/2023 at 4:06:40 PM.    Final (Updating)    US RENAL Result Date: 06/14/2023 CLINICAL DATA:  [id number] Encephalopathy acute [id number] EXAM: RENAL / URINARY TRACT ULTRASOUND COMPLETE COMPARISON:  09/10/2020 FINDINGS: Right Kidney: Renal measurements: 11.8 x 4.9 x 4.8 cm = volume: 143 mL. Echogenicity within normal limits. No mass or hydronephrosis visualized. Left Kidney: Renal measurements: 10.6 x 4.4 x 4.3  cm = volume: 104 mL. Echogenicity within normal limits. No mass or hydronephrosis visualized. Bladder: Abnormally thickened appearance of the urinary bladder wall. Linear area of increased echogenicity anteriorly within the bladder with dirty posterior acoustic shadowing. Other: Small bilateral pleural effusions. IMPRESSION: 1. Abnormally thickened appearance of the urinary bladder wall. Linear area of increased echogenicity anteriorly within the bladder with dirty posterior acoustic shadowing. This may represent air within the bladder lumen, which could be secondary to recent instrumentation or infection. Air within the bladder wall (emphysematous cystitis) is not excluded. Correlation with urinalysis is recommended. 2. Unremarkable sonographic appearance of the kidneys. 3. Small bilateral pleural effusions. Electronically Signed   By: Duanne Guess D.O.   On: 06/14/2023 13:40   ECHOCARDIOGRAM COMPLETE Result Date: 06/14/2023    ECHOCARDIOGRAM REPORT   Patient Name:   Derek Thompson Date of Exam: 06/14/2023 Medical Rec #:  [id number]     Height:       72.0 in Accession #:    [id number]    Weight:       170.9 lb Date of Birth:  Feb 01, 1961     BSA:          1.993 m Patient Age:    62 years      BP:           100/66 mmHg Patient Gender: M             HR:           89 bpm. Exam Location:  Inpatient Procedure: 2D Echo, Cardiac Doppler, Color Doppler and Intracardiac            Opacification Agent (Both Spectral and Color Flow Doppler were            utilized during procedure). Indications:    Stroke, shock  History:        Patient has prior history of Echocardiogram examinations, most                 recent 08/19/2022. CHF, CAD; Risk Factors:Hypertension, Diabetes,                 Dyslipidemia and Former Smoker.  Sonographer:    Vern Claude Referring Phys: Lavone Neri OPYD IMPRESSIONS  1. Large 3 x 3 cm round heterogenous mass at the LV apex which is suggestive of thrombus given severe LV dysfunction and less likely  myxoma - the mass did not take up Definity  contrast (appears avascular). Left ventricular ejection fraction, by estimation, is 20 to 25%. Left ventricular ejection fraction by 2D MOD biplane is 21.2 %. The left ventricle has severely decreased function. The left ventricle demonstrates global hypokinesis. The left ventricular internal cavity size was mildly dilated. Left ventricular diastolic parameters are consistent with Grade II diastolic dysfunction (pseudonormalization). Elevated left ventricular end-diastolic pressure.  2. Right ventricular systolic function is mildly reduced. The right ventricular size is normal. There is mildly elevated pulmonary artery systolic pressure. The estimated right ventricular systolic pressure is 39.0 mmHg.  3. The mitral valve is abnormal. Moderate mitral valve regurgitation.  4. The aortic valve is tricuspid. Aortic valve regurgitation is not visualized.  5. The inferior vena cava is dilated in size with <50% respiratory variability, suggesting right atrial pressure of 15 mmHg. Comparison(s): Changes from prior study are noted. 08/16/2022: LVEF 25%, global hypokinesis. Conclusion(s)/Recommendation(s): Critical findings reported to Dr. Francine Graven and Elmer Picker, NP and acknowledged at 12:23 pm on 06/14/23. FINDINGS  Left Ventricle: Large 3 x 3 cm round heterogenous mass at the LV apex which is suggestive of thrombus given severe LV dysfunction and less likely myxoma - the mass did not take up Definity contrast (appears avascular). Left ventricular ejection fraction, by estimation, is 20 to 25%. Left ventricular ejection fraction by 2D MOD biplane is 21.2 %. The left ventricle has severely decreased function. The left ventricle demonstrates global hypokinesis. Definity contrast agent was given IV to delineate the left ventricular endocardial borders. Strain imaging was not performed. The left ventricular internal cavity size was mildly dilated. There is no left ventricular hypertrophy.  Left ventricular diastolic parameters are consistent with Grade II diastolic dysfunction (pseudonormalization). Elevated left ventricular end-diastolic pressure. Right Ventricle: The right ventricular size is normal. No increase in right ventricular wall thickness. Right ventricular systolic function is mildly reduced. There is mildly elevated pulmonary artery systolic pressure. The tricuspid regurgitant velocity  is 2.45 m/s, and with an assumed right atrial pressure of 15 mmHg, the estimated right ventricular systolic pressure is 39.0 mmHg. Left Atrium: Left atrial size was normal in size. Right Atrium: Right atrial size was normal in size. Pericardium: There is no evidence of pericardial effusion. Mitral Valve: The mitral valve is abnormal. There is mild thickening of the anterior and posterior mitral valve leaflet(s). Moderate mitral valve regurgitation. MV peak gradient, 4.0 mmHg. The mean mitral valve gradient is 2.0 mmHg. Tricuspid Valve: The tricuspid valve is grossly normal. Tricuspid valve regurgitation is mild. Aortic Valve: The aortic valve is tricuspid. Aortic valve regurgitation is not visualized. Aortic valve mean gradient measures 2.0 mmHg. Aortic valve peak gradient measures 3.3 mmHg. Aortic valve area, by VTI measures 2.01 cm. Pulmonic Valve: The pulmonic valve was grossly normal. Pulmonic valve regurgitation is trivial. Aorta: The aortic root and ascending aorta are structurally normal, with no evidence of dilitation. Venous: The inferior vena cava is dilated in size with less than 50% respiratory variability, suggesting right atrial pressure of 15 mmHg. IAS/Shunts: No atrial level shunt detected by color flow Doppler. Additional Comments: 3D imaging was not performed.  LEFT VENTRICLE PLAX 2D                        Biplane EF (MOD) LVIDd:         5.50 cm         LV Biplane EF:   Left LVIDs:         4.80 cm  ventricular LV PW:         0.70 cm                           ejection LV IVS:        0.60 cm                          fraction by LVOT diam:     1.80 cm                          2D MOD LV SV:         29                               biplane is LV SV Index:   14                               21.2 %. LVOT Area:     2.54 cm                                Diastology                                LV e' medial:    5.98 cm/s LV Volumes (MOD)               LV E/e' medial:  16.1 LV vol d, MOD    226.1 ml      LV e' lateral:   9.03 cm/s A2C:                           LV E/e' lateral: 10.7 LV vol d, MOD    185.9 ml A4C: LV vol s, MOD    152.1 ml A2C: LV vol s, MOD    167.2 ml A4C: LV SV MOD A2C:   74.0 ml LV SV MOD A4C:   185.9 ml LV SV MOD BP:    47.0 ml RIGHT VENTRICLE            IVC RV Basal diam:  4.90 cm    IVC diam: 2.50 cm RV Mid diam:    3.50 cm RV S prime:     9.14 cm/s LEFT ATRIUM             Index        RIGHT ATRIUM           Index LA diam:        3.90 cm 1.96 cm/m   RA Area:     18.60 cm LA Vol (A2C):   70.7 ml 35.48 ml/m  RA Volume:   55.90 ml  28.05 ml/m LA Vol (A4C):   57.7 ml 28.96 ml/m LA Biplane Vol: 67.4 ml 33.82 ml/m  AORTIC VALVE                    PULMONIC VALVE AV Area (Vmax):    2.03 cm     PV Vmax:          0.79 m/s AV Area (Vmean):   1.54 cm     PV  Peak grad:     2.5 mmHg AV Area (VTI):     2.01 cm     PR End Diast Vel: 8.12 msec AV Vmax:           90.60 cm/s AV Vmean:          68.900 cm/s AV VTI:            0.142 m AV Peak Grad:      3.3 mmHg AV Mean Grad:      2.0 mmHg LVOT Vmax:         72.40 cm/s LVOT Vmean:        41.800 cm/s LVOT VTI:          0.112 m LVOT/AV VTI ratio: 0.79  AORTA Ao Root diam: 3.00 cm Ao Asc diam:  2.50 cm MITRAL VALVE                 TRICUSPID VALVE MV Area (PHT): 5.20 cm      TR Peak grad:   24.0 mmHg MV Area VTI:   1.40 cm      TR Vmax:        245.00 cm/s MV Peak grad:  4.0 mmHg MV Mean grad:  2.0 mmHg      SHUNTS MV Vmax:       1.00 m/s      Systemic VTI:  0.11 m MV Vmean:      56.4 cm/s     Systemic Diam: 1.80 cm MV  Decel Time: 146 msec MR Peak grad:   65.0 mmHg MR Mean grad:   42.5 mmHg MR Vmax:        403.00 cm/s MR Vmean:       307.5 cm/s MR PISA:        1.57 cm MR PISA Radius: 0.50 cm MV E velocity: 96.50 cm/s MV A velocity: 44.60 cm/s MV E/A ratio:  2.16 Zoila Shutter MD Electronically signed by Zoila Shutter MD Signature Date/Time: 06/14/2023/12:32:48 PM    Final    DG CHEST PORT 1 VIEW Result Date: 06/14/2023 CLINICAL DATA:  Encounter for central line placement EXAM: PORTABLE CHEST 1 VIEW COMPARISON:  Yesterday FINDINGS: Left IJ line with tip at the upper cavoatrial junction. No pneumothorax or new mediastinal widening. There is stable cardiomegaly and bilateral hazy opacification of the chest. Layering pleural fluid is possible. Artifact from EKG leads. IMPRESSION: 1. New central line without complicating feature. 2. Stable aeration Electronically Signed   By: Tiburcio Pea M.D.   On: 06/14/2023 07:28   DG Foot 2 Views Right Result Date: 06/14/2023 CLINICAL DATA:  64 year old male with history of infection of the first and second toes of the right foot. EXAM: RIGHT FOOT - 2 VIEW COMPARISON:  No priors. FINDINGS: Two views of the right foot demonstrate lucent areas of osseous destruction involving the tuft of the distal phalanx of the great toe, as well as in the second toe involving both the distal and mid phalanges centered around the D IP joint. Overlying soft tissues are irregular with probable soft tissue gas. No other definite osseous lesions are noted. No acute displaced fracture or dislocation. IMPRESSION: 1. 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 Electronically Signed   By: Trudie Reed M.D.   On: 06/14/2023 06:42   MR BRAIN WO CONTRAST Result Date: 06/14/2023 CLINICAL DATA:  Mental status change, unknown  cause EXAM: MRI HEAD WITHOUT CONTRAST TECHNIQUE: Multiplanar,  multiecho pulse sequences of the brain and surrounding structures were obtained without intravenous contrast. COMPARISON:  CT head from today. FINDINGS: Brain: Acute right cerebellar infarct. Many small acute infarcts throughout the left frontal, parietal and occipital lobes. More confluent/larger acute or early subacute infarct in the posterior right frontal lobe. No mass occupying acute hemorrhage or midline shift. Remote cerebellar infarcts. No hydrocephalus. Vascular: Major arterial flow voids are maintained at the skull base. Skull and upper cervical spine: Normal marrow signal. Sinuses/Orbits: Clear sinuses.  No acute orbital findings. IMPRESSION: Acute or early subacute confluent infarct in the right frontal lobe. Additional smaller acute infarcts in the right cerebellum, left frontal, parietal and occipital lobes. Given involvement of multiple vascular territories, consider an embolic etiology. Electronically Signed   By: Feliberto Harts M.D.   On: 06/14/2023 02:17   DG Chest Portable 1 View Result Date: 06/13/2023 CLINICAL DATA:  Cough, altered mental status EXAM: PORTABLE CHEST 1 VIEW COMPARISON:  08/18/2022 FINDINGS: Stable cardiomegaly. Aortic atherosclerotic calcification. Diffuse interstitial coarsening greatest in the mid and lower lungs bilaterally. No focal pneumonia. No pleural effusion or pneumothorax. IMPRESSION: Cardiomegaly with interstitial coarsening which may be due to edema or atypical infection. Electronically Signed   By: Minerva Fester M.D.   On: 06/13/2023 21:10   CT HEAD WO CONTRAST ( ) Result Date: 06/13/2023 CLINICAL DATA:  Neuro deficit, acute, stroke suspected Dizziness, altered mental status EXAM: CT HEAD WITHOUT CONTRAST TECHNIQUE: Contiguous axial images were obtained from the base of the skull through the vertex without intravenous contrast. RADIATION DOSE REDUCTION: This exam was performed according to the departmental dose-optimization program which includes  automated exposure control, adjustment of the mA and/or kV according to patient size and/or use of iterative reconstruction technique. COMPARISON:  None Available. FINDINGS: Brain: Probably acute or subacute infarcts in the posterior right frontal lobe. Possible slight petechial hemorrhage without mass occupying acute hemorrhage. No significant mass effect. Midline shift. No hydrocephalus. No visible mass lesion. Remote bilateral cerebellar infarcts. Vascular: No hyperdense vessel.  Calcific atherosclerosis. Skull: No acute fracture. Sinuses/Orbits: Clear sinuses.  No acute orbital findings. Other: No mastoid effusions. IMPRESSION: 1. Probably acute or subacute infarcts in the posterior right frontal lobe. Recommend MRI. 2. Possible slight petechial hemorrhage without mass occupying acute hemorrhage. 3. No significant mass effect. Electronically Signed   By: Feliberto Harts M.D.   On: 06/13/2023 20:51    Micro Results    Recent Results (from the past 240 hours)  Aerobic/Anaerobic Culture w Gram Stain (surgical/deep wound)     Status: None   Collection Time: 06/21/23 10:32 AM   Specimen: Soft Tissue, Other  Result Value Ref Range Status   Specimen Description TISSUE  Final   Special Requests SOFT TISSUE  Final   Gram Stain   Final    RARE WBC PRESENT, PREDOMINANTLY PMN RARE GRAM POSITIVE COCCI IN PAIRS    Culture   Final    RARE STAPHYLOCOCCUS AUREUS FEW STREPTOCOCCUS ANGINOSIS RARE BACTEROIDES FRAGILIS RARE BACTEROIDES THETAIOTAOMICRON BETA LACTAMASE POSITIVE Performed at [facility], [address], [place], [name] [address]    Report Status 06/24/2023 FINAL  Final   Organism ID, Bacteria STAPHYLOCOCCUS AUREUS  Final   Organism ID, Bacteria STREPTOCOCCUS ANGINOSIS  Final      Susceptibility   Staphylococcus aureus - MIC*    CIPROFLOXACIN <=0.5 SENSITIVE Sensitive     ERYTHROMYCIN >=8 RESISTANT Resistant     GENTAMICIN <=0.5 SENSITIVE  Sensitive     OXACILLIN <=0.25  SENSITIVE Sensitive     TETRACYCLINE <=1 SENSITIVE Sensitive     VANCOMYCIN 1 SENSITIVE Sensitive     TRIMETH/SULFA <=10 SENSITIVE Sensitive     CLINDAMYCIN RESISTANT Resistant     RIFAMPIN <=0.5 SENSITIVE Sensitive     Inducible Clindamycin POSITIVE Resistant     LINEZOLID 2 SENSITIVE Sensitive     * RARE STAPHYLOCOCCUS AUREUS   Streptococcus anginosis - MIC*    PENICILLIN <=0.06 SENSITIVE Sensitive     CEFTRIAXONE <=0.12 SENSITIVE Sensitive     ERYTHROMYCIN >=8 RESISTANT Resistant     LEVOFLOXACIN <=0.25 SENSITIVE Sensitive     VANCOMYCIN 0.5 SENSITIVE Sensitive     * FEW STREPTOCOCCUS ANGINOSIS    Today   Subjective    Derek Thompson today has no headache,no chest abdominal pain,no new weakness tingling or numbness, feels much better wants to go home today.    Objective   Blood pressure 127/77, pulse 90, temperature 98.4 F (36.9 C), temperature source Oral, resp. rate 17, height 6' (1.829 m), weight 80.1 kg, SpO2 94%.   Intake/Output Summary (Last 24 hours) at 06/29/2023 0814 Last data filed at 06/29/2023 0509 Gross per 24 hour  Intake 363 ml  Output 900 ml  Net -537 ml    Exam  Awake Alert, No new F.N deficits,    [place].AT,PERRAL Supple Neck,   Symmetrical Chest wall movement, Good air movement bilaterally, CTAB RRR,No Gallops,   +ve B.Sounds, Abd Soft, Non tender,  Foot under bandage, trace bipedal edema   Data Review   Recent Labs  Lab 06/24/23 0621 06/26/23 0521 06/27/23 0422 06/28/23 0439 06/29/23 0447  WBC 12.0* 7.5 6.5 8.5 8.6  HGB 9.5* 10.3* 9.1* 9.0* 9.1*  HCT 30.0* 32.4* 28.2* 28.5* 28.1*  PLT 142* 218 190 193 213  MCV 86.0 86.9 84.9 86.4 85.2  MCH 27.2 27.6 27.4 27.3 27.6  MCHC 31.7 31.8 32.3 31.6 32.4  RDW 20.2* 20.6* 20.1* 20.2* 20.0*  LYMPHSABS 1.0 1.5 1.2 1.3 1.1  MONOABS 0.9 0.6 0.5 0.6 0.6  EOSABS 0.1 0.0 0.0 0.0 0.0  BASOSABS 0.0 0.0 0.0 0.0 0.0    Recent Labs  Lab 06/23/23 0742 06/24/23 0621 06/26/23 0521 06/27/23 0422  06/28/23 0439 06/29/23 0447  NA 135 134* 137 136 138 136  K 3.9 4.1 4.3 4.3 4.5 4.5  CL 112* 108 107 110 108 106  CO2 16* 19* 23 22 22 23   ANIONGAP 7 7 7  4* 8 7  GLUCOSE 118* 142* 124* 124* 84 187*  BUN 19 23 18 19 20 20   CREATININE 1.48* 1.47* 1.31* 1.10 1.11 1.07  AST  --  17  --   --   --   --   ALT  --  10  --   --   --   --   ALKPHOS  --  34*  --   --   --   --   BILITOT  --  0.6  --   --   --   --   ALBUMIN  --  <1.5*  --   --   --   --   CRP  --   --  2.6* 1.4* 1.2* 1.3*  PROCALCITON  --  35.94 13.18 5.26 2.29 1.19  BNP 4,255.9*  --  3,071.4* 3,618.2* 2,724.9* 3,373.6*  MG 1.5* 2.2 1.9 1.7 1.5* 1.8  CALCIUM 7.8* 7.9* 8.3* 8.1* 7.7* 8.1*    Total Time in preparing paper  work, Patent examiner and todays exam - 35 minutes  Signature  -    Susa Raring M.D on 06/29/2023 at 8:14 AM   -  To page go to [email or web address]

## 2023-06-29 NOTE — TOC Transition Note (Addendum)
 Transition of Care [facility]) - Discharge Note   Patient Details  Name: Derek Thompson MRN: [id number] Date of Birth: November 12, 1960  Transition [facility]) CM/SW Contact:  Jessie Foot, RN Phone Number: 06/29/2023, 8:58 AM   Clinical Narrative:    Patient will DC to: [facility] Anticipated DC date: 06/29/2023 Family notified: Patient and Greig Castilla (son) Transport by: Sharin Mons   Per MD patient ready for DC to . RN to call report prior to discharge [phone] Rm# [phone]). RN, patient, patient's family Liz Malady) and facility notified of DC. Discharge Summary and FL2 sent to facility. DC packet on chart. Ambulance transport requested for patient.   Case Manager  will sign off for now as no other interventions are needed. Please consult [place] again if new needs arise.     Final next level of care: Skilled Nursing Facility Barriers to Discharge: Barriers Resolved   Patient Goals and CMS Choice Patient states their goals for this hospitalization and ongoing recovery are:: Rehab CMS Medicare.gov Compare Post Acute Care list provided to:: Patient Choice offered to / list presented to : Patient [place] ownership interest in [facility].provided to:: Patient    Discharge Placement   Existing PASRR number confirmed : 06/29/23          Patient chooses bed at: [facility] Patient to be transferred to facility by: [facility] Name of family member notified: Patient Patient and family notified of of transfer: 06/29/23  Discharge Plan and Services Additional resources added to the After Visit Summary for   In-house Referral: Clinical Social Work   Post Acute Care Choice: Skilled Nursing Facility          DME Arranged: N/A                    Social Drivers of Health (SDOH) Interventions SDOH Screenings   Food Insecurity: No Food Insecurity (06/14/2023)  Housing: Low Risk  (06/14/2023)  Transportation Needs: No Transportation Needs (06/14/2023)  Utilities: Not  At Risk (06/14/2023)  Alcohol Screen: Low Risk  (08/19/2022)  Depression (PHQ2-9): Medium Risk (11/22/2022)  Financial Resource Strain: Low Risk  (08/19/2022)  Tobacco Use: Medium Risk (06/21/2023)     Readmission Risk Interventions     No data to display

## 2023-07-11 ENCOUNTER — Encounter: Payer: Self-pay | Admitting: Family

## 2023-07-11 ENCOUNTER — Ambulatory Visit (INDEPENDENT_AMBULATORY_CARE_PROVIDER_SITE_OTHER): Admitting: Family

## 2023-07-11 DIAGNOSIS — Z89439 Acquired absence of unspecified foot: Secondary | ICD-10-CM

## 2023-07-11 NOTE — Progress Notes (Signed)
 Post-Op Visit Note   Patient: Derek Thompson           Date of Birth: [birth year]           MRN: [id number] Visit Date: 07/11/2023 PCP: Rana Snare, DO  Chief Complaint:  Chief Complaint  Patient presents with   Right Foot - Routine Post Op    06/21/23 right 2nd ray amputation    HPI:  HPI The patient is a 63 year old gentleman who is seen status post right foot second ray amputation Ortho Exam On examination right foot the incisions well-approximated sutures there is no gaping or drainage no erythema no sign of infection  Visit Diagnoses: No diagnosis found.  Plan: Sutures harvested today without incident.  Begin daily dose of cleansing.  Dry dressings.  May advance to 50% weightbearing.  Follow-up in 2 weeks.  Follow-Up Instructions: Return in about 2 weeks (around 07/25/2023).   Imaging: No results found.  Orders:  No orders of the defined types were placed in this encounter.  No orders of the defined types were placed in this encounter.    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) 06/19/2023   NSVT (nonsustained ventricular tachycardia) (HCC) 06/19/2023   Acute CVA (cerebrovascular accident) (HCC) 06/19/2023   Mixed hyperlipidemia 06/19/2023   Noncompliance 06/19/2023   Benign hypertension 06/19/2023   Preop cardiovascular exam 06/19/2023   Chronic osteomyelitis of toe, right (HCC) 06/15/2023   Cutaneous abscess of right foot 06/15/2023   Generalized weakness 06/14/2023   Acute metabolic encephalopathy 06/14/2023   SIRS (systemic inflammatory response syndrome) (HCC) 06/14/2023   High anion gap metabolic acidosis 06/14/2023   Lactic acidosis 06/14/2023   AKI (acute kidney injury) (HCC) 06/14/2023   Acute on chronic anemia 06/14/2023   Hypomagnesemia 06/14/2023   Hyperglycemia 06/14/2023    Septic shock (HCC) 06/14/2023   Depressed left ventricular ejection fraction 06/14/2023   Hyperosmolar hyperglycemic state (HHS) (HCC) 06/13/2023   Hyperkalemia 11/22/2022   Iron deficiency anemia 08/27/2022   MDD (major depressive disorder) 08/27/2022   Healthcare maintenance 08/27/2022   Genetic testing 10/12/2020   Malignant neoplasm of prostate (HCC)    Family history of breast cancer    CAD (coronary artery disease) 06/10/2020   Hypertension 06/10/2020   Dyslipidemia 06/10/2020   DM2 (diabetes mellitus, type 2) (HCC) 06/10/2020   Chronic HFrEF (heart failure with reduced ejection fraction) (HCC)    Pulmonary edema cardiac cause (HCC) 05/22/2020   Past Medical History:  Diagnosis Date   Anemia    Diabetes mellitus without complication (HCC) 1987   Family history of breast cancer    Hypertension    Myocardial infarction (HCC)    Prostate cancer (HCC)     Family History  Problem Relation Age of Onset   Breast cancer Mother        dx 30s/40s, twice   Cancer Maternal Grandfather        unknown type, d. >50    Past Surgical History:  Procedure Laterality Date   ABDOMINAL AORTOGRAM W/LOWER EXTREMITY N/A 06/16/2023   Procedure: ABDOMINAL AORTOGRAM W/LOWER EXTREMITY;  Surgeon: Leonie Douglas, MD;  Location: [facility] INVASIVE CV LAB;  Service: Cardiovascular;  Laterality: N/A;   AMPUTATION Right 06/21/2023   Procedure: RIGHT 2ND RAY AMPUTATION;  Surgeon: Nadara Mustard, MD;  Location[facility] OR;  Service: Orthopedics;  Laterality: Right;   DENTAL SURGERY     RADIOACTIVE SEED IMPLANT N/A 02/04/2021   Procedure: RADIOACTIVE SEED IMPLANT/BRACHYTHERAPY IMPLANT;  Surgeon: Jannifer Hick, MD;  Location: [facility] ORS;  Service: Urology;  Laterality: N/A;   RIGHT/LEFT HEART CATH AND CORONARY ANGIOGRAPHY N/A 05/25/2020   Procedure: RIGHT/LEFT HEART CATH AND CORONARY ANGIOGRAPHY;  Surgeon: [place], Peter M, MD;  Location: [facility] INVASIVE CV LAB;  Service: Cardiovascular;  Laterality: N/A;   RIGHT/LEFT HEART  CATH AND CORONARY ANGIOGRAPHY N/A 10/14/2022   Procedure: RIGHT/LEFT HEART CATH AND CORONARY ANGIOGRAPHY;  Surgeon: Dolores Patty, MD;  Location: [facility] INVASIVE CV LAB;  Service: Cardiovascular;  Laterality: N/A;   SPACE OAR INSTILLATION N/A 02/04/2021   Procedure: SPACE OAR INSTILLATION;  Surgeon: Jannifer Hick, MD;  Location: [facility] ORS;  Service: Urology;  Laterality: N/A;   Social History   Occupational History   Occupation: [employer]  Tobacco Use   Smoking status: Former    Current packs/day: 0.00    Average packs/day: 1 pack/day for 43.0 years (43.0 ttl pk-yrs)    Types: Cigarettes    Start date: 05/22/1974    Quit date: 05/22/2017    Years since quitting: 6.1   Smokeless tobacco: Never   Tobacco comments:    Quit 2019 about 1PPD  Vaping Use   Vaping status: Never Used  Substance and Sexual Activity   Alcohol use: Not Currently    Comment: last drink 2015   Drug use: Not Currently    Comment: none in 7 years   Sexual activity: Not Currently

## 2023-07-13 ENCOUNTER — Encounter: Admitting: Family

## 2023-07-17 ENCOUNTER — Other Ambulatory Visit (HOSPITAL_COMMUNITY): Payer: Self-pay | Admitting: Cardiovascular Disease

## 2023-07-17 ENCOUNTER — Ambulatory Visit: Payer: BC Managed Care – PPO | Attending: Physician Assistant | Admitting: Physician Assistant

## 2023-07-17 DIAGNOSIS — I6523 Occlusion and stenosis of bilateral carotid arteries: Secondary | ICD-10-CM

## 2023-07-17 NOTE — Progress Notes (Signed)
 This encounter was created in error - please disregard.

## 2023-07-18 ENCOUNTER — Encounter: Admitting: Family

## 2023-07-19 ENCOUNTER — Ambulatory Visit (HOSPITAL_COMMUNITY): Admission: RE | Admit: 2023-07-19 | Source: Ambulatory Visit

## 2023-07-21 ENCOUNTER — Telehealth: Payer: Self-pay | Admitting: Radiology

## 2023-07-21 NOTE — Telephone Encounter (Signed)
 Hoy Morn, wound care nurse at rehab facility.   Stated patient looks to have DTI/artial to dorsal portion of foot from incision site and same to right lateral side of foot which is new in the last 24hrs, no pain, no pedal pulse.  States they are ordering arterial doppler with ABI & labs (CBC,CMP, A1C, lithopanel) tomorrow.  Keela's call back number [phone]

## 2023-07-24 NOTE — Telephone Encounter (Signed)
 This pt has an appt tomorrow per Dr. Lajoyce Corners can you please write order for nitroglycerine patch patch for this pt. He spoke with wound care nurse on phone. Did not realize this message was from Friday.Marland Kitchen

## 2023-07-24 NOTE — Telephone Encounter (Signed)
 I called and lm on vm to advise that this pt should go to the ER if he has no pulses to the foot and changes to the appearance of the foot he needs eval. Will hold and try again.

## 2023-07-25 ENCOUNTER — Other Ambulatory Visit (HOSPITAL_COMMUNITY): Payer: Self-pay

## 2023-07-25 ENCOUNTER — Encounter: Admitting: Family

## 2023-07-25 ENCOUNTER — Other Ambulatory Visit: Payer: Self-pay

## 2023-07-25 MED ORDER — NITROGLYCERIN 0.2 MG/HR TD PT24
0.2000 mg | MEDICATED_PATCH | Freq: Every day | TRANSDERMAL | 12 refills | Status: DC
  Filled 2023-07-25: qty 30, 30d supply, fill #0

## 2023-07-25 NOTE — Addendum Note (Signed)
 Addended by: Adonis Huguenin on: 07/25/2023 10:42 AM   Modules accepted: Orders

## 2023-07-28 ENCOUNTER — Other Ambulatory Visit: Payer: Self-pay

## 2023-07-28 ENCOUNTER — Ambulatory Visit (INDEPENDENT_AMBULATORY_CARE_PROVIDER_SITE_OTHER): Admitting: Family

## 2023-07-28 DIAGNOSIS — Z89439 Acquired absence of unspecified foot: Secondary | ICD-10-CM

## 2023-07-28 MED ORDER — NITROGLYCERIN 0.2 MG/HR TD PT24: 0.2000 mg | MEDICATED_PATCH | Freq: Every day | TRANSDERMAL | 12 refills | Status: DC

## 2023-07-28 NOTE — Progress Notes (Signed)
 Post-Op Visit Note   Patient: Derek Thompson           Date of Birth: Oct 10, 1960           MRN: [id number] Visit Date: 07/28/2023 PCP: Rana Snare, DO  Chief Complaint:  Chief Complaint  Patient presents with   Right Foot - Routine Post Op    HPI:  HPI The patient is a 63 year old gentleman who is seen status post second ray amputation in February 19 Ortho Exam On examination right foot the incision is well-healed there is no open area no gaping no drainage he will advance his weightbearing as tolerated follow-up as needed  Visit Diagnoses: No diagnosis found.  Plan: As advance weightbearing as tolerated follow-up as needed  Follow-Up Instructions: No follow-ups on file.   Imaging: No results found.  Orders:  No orders of the defined types were placed in this encounter.  No orders of the defined types were placed in this encounter.    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) 06/19/2023   NSVT (nonsustained ventricular tachycardia) (HCC) 06/19/2023   Acute CVA (cerebrovascular accident) (HCC) 06/19/2023   Mixed hyperlipidemia 06/19/2023   Noncompliance 06/19/2023   Benign hypertension 06/19/2023   Preop cardiovascular exam 06/19/2023   Chronic osteomyelitis of toe, right (HCC) 06/15/2023   Cutaneous abscess of right foot 06/15/2023   Generalized weakness 06/14/2023   Acute metabolic encephalopathy 06/14/2023   SIRS (systemic inflammatory response syndrome) (HCC) 06/14/2023   High anion gap metabolic acidosis 06/14/2023   Lactic acidosis 06/14/2023   AKI (acute kidney injury) (HCC) 06/14/2023   Acute on chronic anemia 06/14/2023   Hypomagnesemia 06/14/2023   Hyperglycemia 06/14/2023   Septic shock (HCC) 06/14/2023   Depressed left ventricular ejection fraction 06/14/2023   Hyperosmolar  hyperglycemic state (HHS) (HCC) 06/13/2023   Hyperkalemia 11/22/2022   Iron deficiency anemia 08/27/2022   MDD (major depressive disorder) 08/27/2022   Healthcare maintenance 08/27/2022   Genetic testing 10/12/2020   Malignant neoplasm of prostate (HCC)    Family history of breast cancer    CAD (coronary artery disease) 06/10/2020   Hypertension 06/10/2020   Dyslipidemia 06/10/2020   DM2 (diabetes mellitus, type 2) (HCC) 06/10/2020   Chronic HFrEF (heart failure with reduced ejection fraction) (HCC)    Pulmonary edema cardiac cause (HCC) 05/22/2020   Past Medical History:  Diagnosis Date   Anemia    Diabetes mellitus without complication (HCC) 1987   Family history of breast cancer    Hypertension    Myocardial infarction (HCC)    Prostate cancer (HCC)     Family History  Problem Relation Age of Onset   Breast cancer Mother        dx 30s/40s, twice   Cancer Maternal Grandfather        unknown type, d. >50    Past Surgical History:  Procedure Laterality Date   ABDOMINAL AORTOGRAM W/LOWER EXTREMITY N/A 06/16/2023   Procedure: ABDOMINAL AORTOGRAM W/LOWER EXTREMITY;  Surgeon: Leonie Douglas, MD;  Location: [facility];  Service: Cardiovascular;  Laterality: N/A;   AMPUTATION Right 06/21/2023   Procedure: RIGHT 2ND RAY AMPUTATION;  Surgeon: Nadara Mustard, MD;  Location: [facility] OR;  Service: Orthopedics;  Laterality: Right;   DENTAL SURGERY     RADIOACTIVE SEED IMPLANT N/A 02/04/2021   Procedure: RADIOACTIVE SEED  IMPLANT/BRACHYTHERAPY IMPLANT;  Surgeon: Jannifer Hick, MD;  Location: [facility];  Service: Urology;  Laterality: N/A;   RIGHT/LEFT HEART CATH AND CORONARY ANGIOGRAPHY N/A 05/25/2020   Procedure: RIGHT/LEFT HEART CATH AND CORONARY ANGIOGRAPHY;  Surgeon: Swaziland, Peter M, MD;  Location: [facility];  Service: Cardiovascular;  Laterality: N/A;   RIGHT/LEFT HEART CATH AND CORONARY ANGIOGRAPHY N/A 10/14/2022   Procedure: RIGHT/LEFT HEART CATH AND CORONARY ANGIOGRAPHY;   Surgeon: Dolores Patty, MD;  Location: [facility];  Service: Cardiovascular;  Laterality: N/A;   SPACE OAR INSTILLATION N/A 02/04/2021   Procedure: SPACE OAR INSTILLATION;  Surgeon: Jannifer Hick, MD;  Location: [facility];  Service: Urology;  Laterality: N/A;   Social History   Occupational History   Occupation: [employer]  Tobacco Use   Smoking status: Former    Current packs/day: 0.00    Average packs/day: 1 pack/day for 43.0 years (43.0 ttl pk-yrs)    Types: Cigarettes    Start date: 05/22/1974    Quit date: 05/22/2017    Years since quitting: 6.1   Smokeless tobacco: Never   Tobacco comments:    Quit 2019 about 1PPD  Vaping Use   Vaping status: Never Used  Substance and Sexual Activity   Alcohol use: Not Currently    Comment: last drink 2015   Drug use: Not Currently    Comment: none in 7 years   Sexual activity: Not Currently

## 2023-08-01 ENCOUNTER — Encounter: Payer: Self-pay | Admitting: Family

## 2023-08-04 ENCOUNTER — Telehealth: Payer: Self-pay

## 2023-08-04 NOTE — Telephone Encounter (Signed)
 Vascular studies done and faxed to [place] FYI Patient has appt next week

## 2023-08-04 NOTE — Telephone Encounter (Signed)
 Noted! Thank you

## 2023-08-09 ENCOUNTER — Encounter: Admitting: Family

## 2023-08-11 ENCOUNTER — Encounter: Payer: Self-pay | Admitting: Family

## 2023-08-11 ENCOUNTER — Ambulatory Visit (INDEPENDENT_AMBULATORY_CARE_PROVIDER_SITE_OTHER): Admitting: Family

## 2023-08-11 DIAGNOSIS — Z89439 Acquired absence of unspecified foot: Secondary | ICD-10-CM

## 2023-08-11 MED ORDER — AMOXICILLIN-POT CLAVULANATE 875-125 MG PO TABS: 1.0000 | ORAL_TABLET | Freq: Two times a day (BID) | ORAL | 0 refills | Status: DC

## 2023-08-11 MED ORDER — PENTOXIFYLLINE ER 400 MG PO TBCR: 400.0000 mg | EXTENDED_RELEASE_TABLET | Freq: Three times a day (TID) | ORAL | 3 refills | Status: DC

## 2023-08-11 NOTE — Progress Notes (Signed)
 Post-Op Visit Note   Patient: Derek Thompson           Date of Birth: [birth year]           MRN: [id number] Visit Date: 08/11/2023 PCP: Rana Snare, DO  Chief Complaint:  Chief Complaint  Patient presents with   Right Foot - Pain    HPI:  HPI The patient is a 63 year old gentleman who is seen status post second ray amputation of the right foot he has been residing at skilled nursing he is on a wheelchair for mobility.  He does complain of some shooting pain he has been using gabapentin as well as tramadol which is adequately covering his pain he does use a nitroglycerin patch as well  Has recently had revascularization of the right lower extremity, February 14. Ortho Exam On examination right foot the second ray amputation has distal dehiscence to the distal aspect this is open about 2 cm in diameter probes 1 cm deep does not probe to bone today.  Concern for lack of improvement since last visit.  He has an area of ischemic tissue over the dorsum of his foot this measures 5 cm x 2 cm there is no drainage today no surrounding erythema Visit Diagnoses: No diagnosis found.  Plan: Will place on trying Trental as well as Augmentin he will continue with the nitroglycerin patches pack open with silver cell.  Nonweightbearing follow-up in the office in 10 days  Concern may require further limb salvage surgery Follow-Up Instructions: No follow-ups on file.   Imaging: No results found.  Orders:  No orders of the defined types were placed in this encounter.  Meds ordered this encounter  Medications   pentoxifylline (TRENTAL) 400 MG CR tablet    Sig: Take 1 tablet (400 mg total) by mouth 3 (three) times daily with meals.    Dispense:  90 tablet    Refill:  3   amoxicillin-clavulanate (AUGMENTIN) 875-125 MG tablet    Sig: Take 1 tablet by mouth every 12 (twelve) hours.    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) 06/19/2023   NSVT (nonsustained ventricular tachycardia) (HCC) 06/19/2023   Acute CVA (cerebrovascular accident) (HCC) 06/19/2023   Mixed hyperlipidemia 06/19/2023   Noncompliance 06/19/2023   Benign hypertension 06/19/2023   Preop cardiovascular exam 06/19/2023   Chronic osteomyelitis of toe, right (HCC) 06/15/2023   Cutaneous abscess of right foot 06/15/2023   Generalized weakness 06/14/2023   Acute metabolic encephalopathy 06/14/2023   SIRS (systemic inflammatory response syndrome) (HCC) 06/14/2023   High anion gap metabolic acidosis 06/14/2023   Lactic acidosis 06/14/2023   AKI (acute kidney injury) (HCC) 06/14/2023   Acute on chronic anemia 06/14/2023   Hypomagnesemia 06/14/2023   Hyperglycemia 06/14/2023   Septic shock (HCC) 06/14/2023   Depressed left ventricular ejection fraction 06/14/2023   Hyperosmolar hyperglycemic state (HHS) (HCC) 06/13/2023   Hyperkalemia 11/22/2022   Iron deficiency anemia 08/27/2022   MDD (major depressive disorder) 08/27/2022   Healthcare maintenance 08/27/2022   Genetic testing 10/12/2020   Malignant neoplasm of prostate (HCC)    Family history of breast cancer    CAD (coronary artery disease) 06/10/2020   Hypertension 06/10/2020   Dyslipidemia 06/10/2020   DM2 (diabetes mellitus, type 2) (HCC)  06/10/2020   Chronic HFrEF (heart failure with reduced ejection fraction) (HCC)    Pulmonary edema cardiac cause (HCC) 05/22/2020   Past Medical History:  Diagnosis Date   Anemia    Diabetes mellitus without complication (HCC) 1987   Family history of breast cancer    Hypertension    Myocardial infarction [facility])    Prostate cancer (HCC)     Family History  Problem Relation Age of Onset   Breast cancer Mother        dx 30s/40s, twice   Cancer Maternal Grandfather        unknown type, d. >50    Past  Surgical History:  Procedure Laterality Date   ABDOMINAL AORTOGRAM W/LOWER EXTREMITY N/A 06/16/2023   Procedure: ABDOMINAL AORTOGRAM W/LOWER EXTREMITY;  Surgeon: Leonie Douglas, MD;  Location: [facility];  Service: Cardiovascular;  Laterality: N/A;   AMPUTATION Right 06/21/2023   Procedure: RIGHT 2ND RAY AMPUTATION;  Surgeon: Nadara Mustard, MD;  Location: [facility] OR;  Service: Orthopedics;  Laterality: Right;   DENTAL SURGERY     RADIOACTIVE SEED IMPLANT N/A 02/04/2021   Procedure: RADIOACTIVE SEED IMPLANT/BRACHYTHERAPY IMPLANT;  Surgeon: Jannifer Hick, MD;  Location: [facility];  Service: Urology;  Laterality: N/A;   RIGHT/LEFT HEART CATH AND CORONARY ANGIOGRAPHY N/A 05/25/2020   Procedure: RIGHT/LEFT HEART CATH AND CORONARY ANGIOGRAPHY;  Surgeon: Swaziland, Peter M, MD;  Location: [facility] INVASIVE CV LAB;  Service: Cardiovascular;  Laterality: N/A;   RIGHT/LEFT HEART CATH AND CORONARY ANGIOGRAPHY N/A 10/14/2022   Procedure: RIGHT/LEFT HEART CATH AND CORONARY ANGIOGRAPHY;  Surgeon: Dolores Patty, MD;  Location: [facility];  Service: Cardiovascular;  Laterality: N/A;   SPACE OAR INSTILLATION N/A 02/04/2021   Procedure: SPACE OAR INSTILLATION;  Surgeon: Jannifer Hick, MD;  Location: [facility];  Service: Urology;  Laterality: N/A;   Social History   Occupational History   Occupation: [employer]  Tobacco Use   Smoking status: Former    Current packs/day: 0.00    Average packs/day: 1 pack/day for 43.0 years (43.0 ttl pk-yrs)    Types: Cigarettes    Start date: 05/22/1974    Quit date: 05/22/2017    Years since quitting: 6.2   Smokeless tobacco: Never   Tobacco comments:    Quit 2019 about 1PPD  Vaping Use   Vaping status: Never Used  Substance and Sexual Activity   Alcohol use: Not Currently    Comment: last drink 2015   Drug use: Not Currently    Comment: none in 7 years   Sexual activity: Not Currently

## 2023-08-25 ENCOUNTER — Ambulatory Visit: Admitting: Family

## 2023-08-25 ENCOUNTER — Other Ambulatory Visit (HOSPITAL_COMMUNITY): Payer: Self-pay | Admitting: Cardiology

## 2023-08-25 DIAGNOSIS — L97509 Non-pressure chronic ulcer of other part of unspecified foot with unspecified severity: Secondary | ICD-10-CM

## 2023-08-25 DIAGNOSIS — E11621 Type 2 diabetes mellitus with foot ulcer: Secondary | ICD-10-CM

## 2023-08-25 DIAGNOSIS — Z89439 Acquired absence of unspecified foot: Secondary | ICD-10-CM

## 2023-08-25 MED ORDER — NITROGLYCERIN 0.2 MG/HR TD PT24: 0.2000 mg | MEDICATED_PATCH | Freq: Every day | TRANSDERMAL | 12 refills | Status: DC

## 2023-08-25 MED ORDER — PENTOXIFYLLINE ER 400 MG PO TBCR: 400.0000 mg | EXTENDED_RELEASE_TABLET | Freq: Three times a day (TID) | ORAL | 3 refills | Status: DC

## 2023-08-25 NOTE — Progress Notes (Signed)
 Post-Op Visit Note   Patient: Derek Thompson           Date of Birth: [birth year]           MRN: [id number] Visit Date: 08/25/2023 PCP: Jearldine Mina, DO  Chief Complaint:  Chief Complaint  Patient presents with   Right Foot - Routine Post Op    06/21/2023 right second ray amputation     HPI:  HPI The patient is a 63 year old gentleman who is seen status post right second ray amputation February 19.  Unfortunately this has been slow to heal he has an area of dehiscence distally as well as ischemic changes over the dorsum of the foot.  He has been using nitroglycerin  and Trental  Ortho Exam On examination right foot unfortunately he has a new ischemic ulcer along the lateral column of the foot there is no maceration or area of fluctuance today there is no surrounding erythema or cellulitis.  Over the dorsum of his foot he has an ongoing ischemic ulcer as well of the area of dehiscence has improved some this is 1 cm in diameter filled with 100% fibrinous tissue.  Today this does not probe to bone there is 1 drop of serous drainage there is no surrounding erythema no ascending cellulitis  Visit Diagnoses:  1. Partial nontraumatic amputation of foot, unspecified laterality (HCC)   2. Ischemic ulcer diabetic foot (HCC)     Plan: Unfortunately feel he is having a microcirculation issue.  He does have adequate blood flow to the ankle.  Vascular has been consulted and feel he has no revascularization options.  Continue with Trental  as well as nitroglycerin  patch  Daily Dial soap cleansing dry dressings packed the area of dehiscence open with dry gauze or silver cell  Concern for further limb salvage surgery in the future.  Follow-Up Instructions: No follow-ups on file.   Imaging: No results found.  Orders:  Orders Placed This Encounter  Procedures   Ambulatory referral to Vascular Surgery   No orders of the defined types were placed in this encounter.    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) 06/19/2023   NSVT (nonsustained ventricular tachycardia) (HCC) 06/19/2023   Acute CVA (cerebrovascular accident) (HCC) 06/19/2023   Mixed hyperlipidemia 06/19/2023   Noncompliance 06/19/2023   Benign hypertension 06/19/2023   Preop cardiovascular exam 06/19/2023   Chronic osteomyelitis of toe, right (HCC) 06/15/2023   Cutaneous abscess of right foot 06/15/2023   Generalized weakness 06/14/2023   Acute metabolic encephalopathy 06/14/2023   SIRS (systemic inflammatory response syndrome) (HCC) 06/14/2023   High anion gap metabolic acidosis 06/14/2023   Lactic acidosis 06/14/2023   AKI (acute kidney injury) (HCC) 06/14/2023   Acute on chronic anemia 06/14/2023   Hypomagnesemia 06/14/2023   Hyperglycemia 06/14/2023   Septic shock (HCC) 06/14/2023   Depressed left ventricular ejection fraction 06/14/2023   Hyperosmolar hyperglycemic state (HHS) (HCC) 06/13/2023   Hyperkalemia 11/22/2022   Iron deficiency anemia 08/27/2022   MDD (major depressive disorder) 08/27/2022   Healthcare maintenance 08/27/2022   Genetic testing 10/12/2020   Malignant neoplasm of prostate (HCC)    Family history of breast cancer    CAD (coronary artery disease) 06/10/2020   Hypertension 06/10/2020   Dyslipidemia 06/10/2020   DM2 (diabetes mellitus, type 2) (HCC) 06/10/2020   Chronic HFrEF (heart failure with  reduced ejection fraction) (HCC)    Pulmonary edema cardiac cause (HCC) 05/22/2020   Past Medical History:  Diagnosis Date   Anemia    Diabetes mellitus without complication (HCC) 1987   Family history of breast cancer    Hypertension    Myocardial infarction [facility])    Prostate cancer (HCC)     Family History  Problem Relation Age of Onset   Breast cancer Mother        dx 30s/40s, twice   Cancer Maternal  Grandfather        unknown type, d. >50    Past Surgical History:  Procedure Laterality Date   ABDOMINAL AORTOGRAM W/LOWER EXTREMITY N/A 06/16/2023   Procedure: ABDOMINAL AORTOGRAM W/LOWER EXTREMITY;  Surgeon: Carlene Che, MD;  Location: [facility];  Service: Cardiovascular;  Laterality: N/A;   AMPUTATION Right 06/21/2023   Procedure: RIGHT 2ND RAY AMPUTATION;  Surgeon: Timothy Ford, MD;  Location: [facility] OR;  Service: Orthopedics;  Laterality: Right;   DENTAL SURGERY     RADIOACTIVE SEED IMPLANT N/A 02/04/2021   Procedure: RADIOACTIVE SEED IMPLANT/BRACHYTHERAPY IMPLANT;  Surgeon: Lahoma Pigg, MD;  Location: [facility];  Service: Urology;  Laterality: N/A;   RIGHT/LEFT HEART CATH AND CORONARY ANGIOGRAPHY N/A 05/25/2020   Procedure: RIGHT/LEFT HEART CATH AND CORONARY ANGIOGRAPHY;  Surgeon: Swazil[place], Peter M, MD;  Location: [facility] INVASIVE CV LAB;  Service: Cardiovascular;  Laterality: N/A;   RIGHT/LEFT HEART CATH AND CORONARY ANGIOGRAPHY N/A 10/14/2022   Procedure: RIGHT/LEFT HEART CATH AND CORONARY ANGIOGRAPHY;  Surgeon: Mardell Shade, MD;  Location: [facility];  Service: Cardiovascular;  Laterality: N/A;   SPACE OAR INSTILLATION N/A 02/04/2021   Procedure: SPACE OAR INSTILLATION;  Surgeon: Lahoma Pigg, MD;  Location: [facility];  Service: Urology;  Laterality: N/A;   Social History   Occupational History   Occupation: [employer]  Tobacco Use   Smoking status: Former    Current packs/day: 0.00    Average packs/day: 1 pack/day for 43.0 years (43.0 ttl pk-yrs)    Types: Cigarettes    Start date: 05/22/1974    Quit date: 05/22/2017    Years since quitting: 6.2   Smokeless tobacco: Never   Tobacco comments:    Quit 2019 about 1PPD  Vaping Use   Vaping status: Never Used  Substance and Sexual Activity   Alcohol use: Not Currently    Comment: last drink 2015   Drug use: Not Currently    Comment: none in 7 years   Sexual activity: Not Currently

## 2023-08-30 ENCOUNTER — Encounter: Payer: Self-pay | Admitting: Family

## 2023-08-31 ENCOUNTER — Telehealth: Payer: Self-pay

## 2023-08-31 ENCOUNTER — Telehealth: Payer: Self-pay | Admitting: Orthopedic Surgery

## 2023-08-31 NOTE — Telephone Encounter (Signed)
 Pt's son D-Andrew request a call from Dr Julio Ohm to discuss the patient's care.   That's the only information he would give.    D-Andrew's call back number [phone]

## 2023-08-31 NOTE — Telephone Encounter (Signed)
 Pt was seen in the office 08/25/2023 please see message below.

## 2023-08-31 NOTE — Telephone Encounter (Addendum)
-  Keela, wound care RN called stating the orthopedic thought vascular could help with tx's but she was told no options available.  Needs more info. -consulted with Dr. Edgardo Goodwill & consult with ortho to seek best tx options.  Can order updated ABI if needed.

## 2023-09-01 ENCOUNTER — Emergency Department (HOSPITAL_COMMUNITY)

## 2023-09-01 ENCOUNTER — Other Ambulatory Visit: Payer: Self-pay

## 2023-09-01 ENCOUNTER — Inpatient Hospital Stay (HOSPITAL_COMMUNITY)
Admission: EM | Admit: 2023-09-01 | Discharge: 2023-09-04 | DRG: 638 | Disposition: A | Discharge status: Home / Self Care | Attending: Internal Medicine | Admitting: Internal Medicine

## 2023-09-01 DIAGNOSIS — M86671 Other chronic osteomyelitis, right ankle and foot: Secondary | ICD-10-CM | POA: Diagnosis not present

## 2023-09-01 DIAGNOSIS — Z5900 Homelessness unspecified: Secondary | ICD-10-CM | POA: Diagnosis not present

## 2023-09-01 DIAGNOSIS — I110 Hypertensive heart disease with heart failure: Secondary | ICD-10-CM

## 2023-09-01 DIAGNOSIS — E1165 Type 2 diabetes mellitus with hyperglycemia: Secondary | ICD-10-CM | POA: Diagnosis present

## 2023-09-01 DIAGNOSIS — I5022 Chronic systolic (congestive) heart failure: Secondary | ICD-10-CM | POA: Diagnosis present

## 2023-09-01 DIAGNOSIS — E1169 Type 2 diabetes mellitus with other specified complication: Secondary | ICD-10-CM | POA: Diagnosis not present

## 2023-09-01 DIAGNOSIS — Z89421 Acquired absence of other right toe(s): Secondary | ICD-10-CM | POA: Diagnosis not present

## 2023-09-01 DIAGNOSIS — I5020 Unspecified systolic (congestive) heart failure: Secondary | ICD-10-CM

## 2023-09-01 DIAGNOSIS — Z794 Long term (current) use of insulin: Secondary | ICD-10-CM | POA: Diagnosis not present

## 2023-09-01 DIAGNOSIS — N179 Acute kidney failure, unspecified: Secondary | ICD-10-CM | POA: Diagnosis not present

## 2023-09-01 DIAGNOSIS — I2510 Atherosclerotic heart disease of native coronary artery without angina pectoris: Secondary | ICD-10-CM | POA: Diagnosis present

## 2023-09-01 DIAGNOSIS — D509 Iron deficiency anemia, unspecified: Secondary | ICD-10-CM | POA: Diagnosis not present

## 2023-09-01 DIAGNOSIS — Z89411 Acquired absence of right great toe: Secondary | ICD-10-CM

## 2023-09-01 DIAGNOSIS — E785 Hyperlipidemia, unspecified: Secondary | ICD-10-CM | POA: Diagnosis not present

## 2023-09-01 DIAGNOSIS — M869 Osteomyelitis, unspecified: Principal | ICD-10-CM | POA: Diagnosis present

## 2023-09-01 DIAGNOSIS — Z79899 Other long term (current) drug therapy: Secondary | ICD-10-CM

## 2023-09-01 DIAGNOSIS — I96 Gangrene, not elsewhere classified: Secondary | ICD-10-CM

## 2023-09-01 DIAGNOSIS — Z87891 Personal history of nicotine dependence: Secondary | ICD-10-CM

## 2023-09-01 DIAGNOSIS — M86171 Other acute osteomyelitis, right ankle and foot: Secondary | ICD-10-CM | POA: Diagnosis not present

## 2023-09-01 DIAGNOSIS — Z803 Family history of malignant neoplasm of breast: Secondary | ICD-10-CM

## 2023-09-01 DIAGNOSIS — L97519 Non-pressure chronic ulcer of other part of right foot with unspecified severity: Secondary | ICD-10-CM | POA: Diagnosis present

## 2023-09-01 DIAGNOSIS — Z8673 Personal history of transient ischemic attack (TIA), and cerebral infarction without residual deficits: Secondary | ICD-10-CM

## 2023-09-01 DIAGNOSIS — I252 Old myocardial infarction: Secondary | ICD-10-CM

## 2023-09-01 DIAGNOSIS — E1152 Type 2 diabetes mellitus with diabetic peripheral angiopathy with gangrene: Secondary | ICD-10-CM | POA: Diagnosis present

## 2023-09-01 DIAGNOSIS — Z8546 Personal history of malignant neoplasm of prostate: Secondary | ICD-10-CM | POA: Diagnosis not present

## 2023-09-01 DIAGNOSIS — Z993 Dependence on wheelchair: Secondary | ICD-10-CM | POA: Diagnosis not present

## 2023-09-01 DIAGNOSIS — Z7901 Long term (current) use of anticoagulants: Secondary | ICD-10-CM

## 2023-09-01 DIAGNOSIS — E11621 Type 2 diabetes mellitus with foot ulcer: Secondary | ICD-10-CM | POA: Diagnosis present

## 2023-09-01 LAB — GLUCOSE, CAPILLARY: Glucose-Capillary: 182 mg/dL — ABNORMAL HIGH (ref 70–99)

## 2023-09-01 LAB — CBC WITH DIFFERENTIAL/PLATELET
Abs Immature Granulocytes: 0.04 10*3/uL (ref 0.00–0.07)
Basophils Absolute: 0 10*3/uL (ref 0.0–0.1)
Basophils Relative: 0 %
Eosinophils Absolute: 0.1 10*3/uL (ref 0.0–0.5)
Eosinophils Relative: 1 %
HCT: 28.8 % — ABNORMAL LOW (ref 39.0–52.0)
Hemoglobin: 9.4 g/dL — ABNORMAL LOW (ref 13.0–17.0)
Immature Granulocytes: 1 %
Lymphocytes Relative: 18 %
Lymphs Abs: 1.5 10*3/uL (ref 0.7–4.0)
MCH: 28.5 pg (ref 26.0–34.0)
MCHC: 32.6 g/dL (ref 30.0–36.0)
MCV: 87.3 fL (ref 80.0–100.0)
Monocytes Absolute: 0.8 10*3/uL (ref 0.1–1.0)
Monocytes Relative: 11 %
Neutro Abs: 5.6 10*3/uL (ref 1.7–7.7)
Neutrophils Relative %: 69 %
Platelets: 268 10*3/uL (ref 150–400)
RBC: 3.3 MIL/uL — ABNORMAL LOW (ref 4.22–5.81)
RDW: 16.4 % — ABNORMAL HIGH (ref 11.5–15.5)
WBC: 8 10*3/uL (ref 4.0–10.5)
nRBC: 0 % (ref 0.0–0.2)

## 2023-09-01 LAB — COMPREHENSIVE METABOLIC PANEL WITH GFR
ALT: 22 U/L (ref 0–44)
AST: 15 U/L (ref 15–41)
Albumin: 3.2 g/dL — ABNORMAL LOW (ref 3.5–5.0)
Alkaline Phosphatase: 72 U/L (ref 38–126)
Anion gap: 5 (ref 5–15)
BUN: 32 mg/dL — ABNORMAL HIGH (ref 8–23)
CO2: 20 mmol/L — ABNORMAL LOW (ref 22–32)
Calcium: 9.3 mg/dL (ref 8.9–10.3)
Chloride: 109 mmol/L (ref 98–111)
Creatinine, Ser: 1.51 mg/dL — ABNORMAL HIGH (ref 0.61–1.24)
GFR, Estimated: 51 mL/min — ABNORMAL LOW (ref >60)
Glucose, Bld: 154 mg/dL — ABNORMAL HIGH (ref 70–99)
Potassium: 4.9 mmol/L (ref 3.5–5.1)
Sodium: 134 mmol/L — ABNORMAL LOW (ref 135–145)
Total Bilirubin: 0.5 mg/dL (ref 0.0–1.2)
Total Protein: 8.5 g/dL — ABNORMAL HIGH (ref 6.5–8.1)

## 2023-09-01 LAB — SURGICAL PCR SCREEN
MRSA, PCR: NEGATIVE
Staphylococcus aureus: NEGATIVE

## 2023-09-01 LAB — C-REACTIVE PROTEIN: CRP: 1.5 mg/dL — ABNORMAL HIGH (ref <1.0)

## 2023-09-01 LAB — SEDIMENTATION RATE: Sed Rate: 103 mm/h — ABNORMAL HIGH (ref 0–16)

## 2023-09-01 LAB — I-STAT CG4 LACTIC ACID, ED: Lactic Acid, Venous: 1.7 mmol/L (ref 0.5–1.9)

## 2023-09-01 MED ORDER — INSULIN ASPART 100 UNIT/ML IJ SOLN
0.0000 [IU] | Freq: Three times a day (TID) | INTRAMUSCULAR | Status: DC
  Administered 2023-09-02: 2 [IU] via SUBCUTANEOUS
  Administered 2023-09-02 – 2023-09-03 (×3): 1 [IU] via SUBCUTANEOUS
  Administered 2023-09-03: 3 [IU] via SUBCUTANEOUS
  Administered 2023-09-04: 2 [IU] via SUBCUTANEOUS
  Administered 2023-09-04: 3 [IU] via SUBCUTANEOUS

## 2023-09-01 MED ORDER — ENOXAPARIN SODIUM 40 MG/0.4ML IJ SOSY: 40.0000 mg | PREFILLED_SYRINGE | INTRAMUSCULAR | Status: DC

## 2023-09-01 MED ORDER — ACETAMINOPHEN 325 MG PO TABS: 650.0000 mg | ORAL_TABLET | Freq: Four times a day (QID) | ORAL | Status: DC | PRN

## 2023-09-01 MED ORDER — PENTOXIFYLLINE ER 400 MG PO TBCR
400.0000 mg | EXTENDED_RELEASE_TABLET | Freq: Three times a day (TID) | ORAL | Status: DC
  Administered 2023-09-02 – 2023-09-04 (×7): 400 mg via ORAL
  Filled 2023-09-01 (×9): qty 1

## 2023-09-01 MED ORDER — INSULIN ASPART 100 UNIT/ML IJ SOLN
0.0000 [IU] | Freq: Every day | INTRAMUSCULAR | Status: DC
  Administered 2023-09-03: 2 [IU] via SUBCUTANEOUS

## 2023-09-01 MED ORDER — TAMSULOSIN HCL 0.4 MG PO CAPS
0.4000 mg | ORAL_CAPSULE | Freq: Every day | ORAL | Status: DC
  Administered 2023-09-02 – 2023-09-04 (×3): 0.4 mg via ORAL
  Filled 2023-09-01 (×3): qty 1

## 2023-09-01 MED ORDER — GABAPENTIN 300 MG PO CAPS
300.0000 mg | ORAL_CAPSULE | Freq: Two times a day (BID) | ORAL | Status: DC
  Administered 2023-09-01 – 2023-09-04 (×6): 300 mg via ORAL
  Filled 2023-09-01 (×6): qty 1

## 2023-09-01 MED ORDER — METOPROLOL SUCCINATE ER 25 MG PO TB24
25.0000 mg | ORAL_TABLET | Freq: Every day | ORAL | Status: DC
  Administered 2023-09-02 – 2023-09-04 (×3): 25 mg via ORAL
  Filled 2023-09-01 (×3): qty 1

## 2023-09-01 MED ORDER — SPIRONOLACTONE 12.5 MG HALF TABLET: 25.0000 mg | ORAL_TABLET | Freq: Every day | ORAL | Status: DC

## 2023-09-01 MED ORDER — VANCOMYCIN HCL IN DEXTROSE 1-5 GM/200ML-% IV SOLN
1000.0000 mg | Freq: Once | INTRAVENOUS | Status: AC
  Administered 2023-09-01: 1000 mg via INTRAVENOUS
  Filled 2023-09-01: qty 200

## 2023-09-01 MED ORDER — FUROSEMIDE 20 MG PO TABS: 20.0000 mg | ORAL_TABLET | Freq: Every day | ORAL | Status: DC

## 2023-09-01 MED ORDER — PIPERACILLIN-TAZOBACTAM 3.375 G IVPB 30 MIN
3.3750 g | Freq: Once | INTRAVENOUS | Status: AC
  Administered 2023-09-01: 3.375 g via INTRAVENOUS
  Filled 2023-09-01: qty 50

## 2023-09-01 MED ORDER — NITROGLYCERIN 0.2 MG/HR TD PT24
0.2000 mg | MEDICATED_PATCH | Freq: Every day | TRANSDERMAL | Status: DC
  Administered 2023-09-02 – 2023-09-04 (×3): 0.2 mg via TRANSDERMAL
  Filled 2023-09-01 (×3): qty 1

## 2023-09-01 MED ORDER — DIGOXIN 125 MCG PO TABS
0.1250 mg | ORAL_TABLET | Freq: Every day | ORAL | Status: DC
  Administered 2023-09-02 – 2023-09-04 (×3): 0.125 mg via ORAL
  Filled 2023-09-01 (×3): qty 1

## 2023-09-01 MED ORDER — EZETIMIBE 10 MG PO TABS
10.0000 mg | ORAL_TABLET | Freq: Every day | ORAL | Status: DC
  Administered 2023-09-02 – 2023-09-04 (×3): 10 mg via ORAL
  Filled 2023-09-01 (×3): qty 1

## 2023-09-01 MED ORDER — ACETAMINOPHEN 650 MG RE SUPP: 650.0000 mg | Freq: Four times a day (QID) | RECTAL | Status: DC | PRN

## 2023-09-01 MED ORDER — ENOXAPARIN SODIUM 80 MG/0.8ML IJ SOSY
1.0000 mg/kg | PREFILLED_SYRINGE | Freq: Once | INTRAMUSCULAR | Status: AC
  Administered 2023-09-01: 75 mg via SUBCUTANEOUS
  Filled 2023-09-01: qty 0.75

## 2023-09-01 MED ORDER — ATORVASTATIN CALCIUM 80 MG PO TABS
80.0000 mg | ORAL_TABLET | Freq: Every day | ORAL | Status: DC
  Administered 2023-09-02 – 2023-09-04 (×3): 80 mg via ORAL
  Filled 2023-09-01 (×3): qty 1

## 2023-09-01 NOTE — Hospital Course (Signed)
 Osteomyelitis of R foot  The patient is s/p right foot 1st and 2nd metatarsal amputations in February. He presented with poor wound healing and purulent drainage/exposed bone from his previous amputation site, in addition to increased swelling in that area. He received a dose of zosyn  in the ED and ortho was consulted, who recommended BKA, however, the patient is not interested in pursuing this. He remained medically stable and was transitioned to doxycycline 100 mg BID and will complete a two week course of this, with ortho follow up in one week. He is also recommended to follow up with the [facility].   Iron Deficiency Anemia  Hgb stable at ~9. RDW increased. Denies any melena or BRBPR. Last iron labs were 06/15/2023 sat ratio was 14 and Ferritin was 222. Will need age appropriate cancer screening OP.  AKI  Cr ~1.1 at baseline and up to 1.5 here, now downtrending.  Normal urine output.  Hx of LV thrombus Resumed home eliquis  5 mg BID.   T2DM A1c 8.7%. On lantus  20u daily and humalog SSI at home, CBGs well controlled with SSI while inpatient.  Has not been adherent to Lantus  at home due to fear of using insulin  from prior episodes where he accidentally used too much insulin .  May benefit from further diabetes education.  Previously used metformin  with no concerns.  SGLT2 considered given heart failure, however it is too expensive for him.  HFrEF HTN Euvolemic on exam. Continued home digoxin  and metoprolol . Held lasix  and spironolactone  given AKI.  Recommend close follow-up with heart failure team outpatient.  HLD Resumed home lipitor  80 mg daily.  SDoH The patient is currently unhoused and has been [facility] with [facility] to obtain [facility]. He had been staying at a SNF for the last 60 days after his amputation, however, they notified him that he maxed out his days and he was discharged on 5/2, the day he presented to the ED.

## 2023-09-01 NOTE — Telephone Encounter (Signed)
 Noted.

## 2023-09-01 NOTE — Telephone Encounter (Signed)
 Attempted to call, no answer. Did speak with [facility] wound RN, recommended eval in ED for exposed bone -- expected BKA  Julio Ohm aware of patient

## 2023-09-01 NOTE — Plan of Care (Signed)

## 2023-09-01 NOTE — ED Provider Notes (Signed)
 [place] [facility] Provider Note   CSN: [id number] Arrival date & time: 09/01/23  1135     History  Chief Complaint  Patient presents with   Wound Check    Derek Thompson is a 63 y.o. male here presenting with right foot pain and drainage.  Patient had a 1st and 2nd toe amputation in February done by Dr. Julio Ohm.  Patient been followed with wound care and also Ortho and internal medicine clinic.  Patient has been having dressing changes and most recently was done yesterday.  However patient was noted to have poor healing wounds and also worsening drainage.  Patient not currently on antibiotics.  Patient was called yesterday to come to the ER for foot amputation.  Patient denies any fevers.  The history is provided by the patient.       Home Medications Prior to Admission medications   Medication Sig Start Date End Date Taking? Authorizing Provider  Accu-Chek Softclix Lancets lancets SMARTSIG:Topical 1-4 Times Daily 08/20/22   Atway, Rayann N, DO  amoxicillin -clavulanate (AUGMENTIN ) 875-125 MG tablet Take 1 tablet by mouth every 12 (twelve) hours. 08/11/23   Reuben Castilla, NP  apixaban  (ELIQUIS ) 5 MG TABS tablet Take 1 tablet (5 mg total) by mouth 2 (two) times daily. 06/29/23   Cala Castleman, MD  atorvastatin  (LIPITOR ) 40 MG tablet Take 2 tablets (80 mg total) by mouth daily. 06/29/23   Singh, Prashant K, MD  blood glucose meter kit and supplies KIT Dispense based on patient and insurance preference. Use up to four times daily as directed. (FOR ICD-9 250.00, 250.01). 08/20/22   Atway, Rayann N, DO  digoxin  (LANOXIN ) 0.125 MG tablet Take 1 tablet (0.125 mg total) by mouth daily. 09/07/22   Ruddy Corral M, PA-C  ezetimibe  (ZETIA ) 10 MG tablet Take 1 tablet (10 mg total) by mouth daily. 11/25/22   Jearldine Mina, DO  ferrous sulfate  325 (65 FE) MG tablet TAKE 1 TABLET BY MOUTH EVERY DAY Patient not taking: Reported on 06/13/2023 09/20/22   Sridharan,  Sriramkumar, MD  furosemide  (LASIX ) 20 MG tablet Take 1 tablet (20 mg total) by mouth daily. 10/05/22 10/05/23  Bensimhon, Rheta Celestine, MD  gabapentin  (NEURONTIN ) 300 MG capsule TAKE 1 CAPSULE BY MOUTH TWICE A DAY Patient not taking: Reported on 06/13/2023 09/29/22   Sridharan, Sriramkumar, MD  glucose blood (CONTOUR NEXT TEST) test strip Use to check blood sugar up to 3 times daily as instructed 11/22/22   Zheng, Michael, DO  insulin  aspart (NOVOLOG ) 100 UNIT/ML FlexPen Before each meal 3 times a day, 140-199 - 2 units, 200-250 - 4 units, 251-299 - 6 units,  300-349 - 8 units,  350 or above 10 units. 06/29/23   Singh, Prashant K, MD  insulin  glargine (LANTUS  SOLOSTAR) 100 UNIT/ML Solostar Pen INJECT 16 UNITS INTO THE SKIN AT BEDTIME. Patient not taking: Reported on 06/13/2023 10/10/22   Sridharan, Sriramkumar, MD  loperamide  (IMODIUM ) 2 MG capsule Take 2 capsules (4 mg total) by mouth every 6 (six) hours as needed for diarrhea or loose stools. 06/29/23   Singh, Prashant K, MD  metoprolol  succinate (TOPROL -XL) 25 MG 24 hr tablet TAKE 1 TABLET BY MOUTH DAILY 09/12/22   Sridharan, Sriramkumar, MD  Multiple Vitamin (MULTIVITAMIN) tablet Take 1 tablet by mouth daily. Patient not taking: Reported on 06/13/2023    [provider]  nitroGLYCERIN  (NITRODUR - DOSED IN MG/24 HR) 0.2 mg/hr patch Place 1 patch (0.2 mg total) onto the skin  daily. 08/25/23   Zamora, Erin R, NP  pentoxifylline  (TRENTAL ) 400 MG CR tablet Take 1 tablet (400 mg total) by mouth 3 (three) times daily with meals. 08/25/23   Zamora, Erin R, NP  spironolactone  (ALDACTONE ) 25 MG tablet Take 1 tablet (25 mg total) by mouth daily. 10/05/22   Bensimhon, Rheta Celestine, MD  tamsulosin  (FLOMAX ) 0.4 MG CAPS capsule TAKE 1 CAPSULE BY MOUTH EVERYDAY AT BEDTIME Patient not taking: Reported on 06/13/2023 09/12/22   Jeannene Milling, MD      Allergies    Patient has no known allergies.    Review of Systems   Review of Systems  Musculoskeletal:         Right foot drainage  All other systems reviewed and are negative.   Physical Exam Updated Vital Signs BP 114/60 (BP Location: Left Arm)   Pulse 88   Temp 98.3 F (36.8 C)   Resp 18   Ht 6' (1.829 m)   Wt 74.8 kg   SpO2 100%   BMI 22.38 kg/m  Physical Exam Vitals and nursing note reviewed.  Constitutional:      Appearance: Normal appearance.  HENT:     Head: Normocephalic.     Nose: Nose normal.     Mouth/Throat:     Mouth: Mucous membranes are moist.  Eyes:     Extraocular Movements: Extraocular movements intact.     Pupils: Pupils are equal, round, and reactive to light.  Cardiovascular:     Rate and Rhythm: Normal rate.     Pulses: Normal pulses.     Heart sounds: Normal heart sounds.  Pulmonary:     Effort: Pulmonary effort is normal.     Breath sounds: Normal breath sounds.  Abdominal:     General: Abdomen is flat.     Palpations: Abdomen is soft.  Musculoskeletal:     Cervical back: Normal range of motion.     Comments: Patient has a amputation of the right 1st and 2nd toe.  There is purulent discharge at the incision wound and some black eschar proximal to that area.  Patient does have 2+ pulses.  Neurological:     General: No focal deficit present.     Mental Status: He is alert and oriented to person, place, and time.  Psychiatric:        Mood and Affect: Mood normal.        Behavior: Behavior normal.     ED Results / Procedures / Treatments   Labs (all labs ordered are listed, but only abnormal results are displayed) Labs Reviewed  COMPREHENSIVE METABOLIC PANEL WITH GFR - Abnormal; Notable for the following components:      Result Value   Sodium 134 (*)    CO2 20 (*)    Glucose, Bld 154 (*)    BUN 32 (*)    Creatinine, Ser 1.51 (*)    Total Protein 8.5 (*)    Albumin 3.2 (*)    GFR, Estimated 51 (*)    All other components within normal limits  CBC WITH DIFFERENTIAL/PLATELET - Abnormal; Notable for the following components:   RBC 3.30 (*)     Hemoglobin 9.4 (*)    HCT 28.8 (*)    RDW 16.4 (*)    All other components within normal limits  SEDIMENTATION RATE - Abnormal; Notable for the following components:   Sed Rate 103 (*)    All other components within normal limits  C-REACTIVE PROTEIN - Abnormal; Notable for the  following components:   CRP 1.5 (*)    All other components within normal limits  I-STAT CG4 LACTIC ACID, ED  I-STAT CG4 LACTIC ACID, ED    EKG None  Radiology DG Foot 2 Views Right Result Date: 09/01/2023 CLINICAL DATA:  Nonhealing wounds.  Diabetes. EXAM: RIGHT FOOT - 2 VIEW COMPARISON:  June 14, 2023. FINDINGS: Status post amputation of the first and second toes. Irregularity is seen involving the distal portions of the residual first and second metatarsals concerning for possible osteomyelitis. Moderately displaced distal third metatarsal fracture is noted of indeterminate age. IMPRESSION: Status post amputation of first and second toes. Irregularity is seen involving the distal portions of the residual first and second metatarsals concerning for possible osteomyelitis. Moderately displaced distal third metatarsal fracture of indeterminate age. Electronically Signed   By: Rosalene Colon M.D.   On: 09/01/2023 12:54    Procedures Procedures    Medications Ordered in ED Medications  vancomycin  (VANCOCIN ) IVPB 1000 mg/200 mL premix (has no administration in time range)  piperacillin -tazobactam (ZOSYN ) IVPB 3.375 g (has no administration in time range)    ED Course/ Medical Decision Making/ A&P                                 Medical Decision Making Domani Bonam is a 63 y.o. male here presenting with concern for possible osteomyelitis of the amputation site.  X-ray showed osteomyelitis.  White blood cell count is normal but ESR and CRP elevated.  Discussed case with Dr. Hermina Loosen from ortho.  He states that either him or Dr. Julio Ohm will see patient tomorrow.  Patient was given IV antibiotics.  Internal  medicine teaching service to admit.   Problems Addressed: Osteomyelitis of right foot, unspecified type [facility]): acute illness or injury  Amount and/or Complexity of Data Reviewed Labs: ordered. Decision-making details documented in ED Course. Radiology: ordered.  Risk Prescription drug management.    Final Clinical Impression(s) / ED Diagnoses Final diagnoses:  None    Rx / DC Orders ED Discharge Orders     None         Dalene Duck, MD 09/01/23 [phone]

## 2023-09-01 NOTE — H&P (Signed)
 Date: 09/01/2023               Patient Name:  Derek Thompson MRN: [id number]  DOB: [birth year] Age / Sex: 63 y.o., male   PCP: Jearldine Mina, DO         Medical Service: Internal Medicine Teaching Service         Attending Physician: Dr. Priscella Brooms, DO      First Contact: Dr. Jose Ngo, MD     Second Contact: Dr. Lorelle Roll, DO         After Hours (After 5p/  First Contact Pager: [phone]  weekends / holidays): Second Contact Pager: [phone]   SUBJECTIVE   Chief Complaint: poor wound healing   History of Present Illness: Derek Thompson is a 63 yo male with HFrEF (EF 20-25%), previous CVA in February, CAD, HTN, HLD, and T2DM who presents with right foot wound.   The patient had an amputation of his R 1st and 2nd toes in February. He has been at [facility] since then and was reportedly discharged from the facility today. The patient has had poor wound healing since then and failed an outpatient course of Augmentin . His orthopedic surgeon, Dr. Julio Ohm, recommended that he come to the hospital as he likely would need another amputation. He does not have any pain in his right foot right now, as he does not have feeling there. He first noticed that after his stitches were removed that the wound dehisced. He has been having purulence. The patient denies fevers, but does endorse chills chronically that have not worsened and attributes this to his anemia. He denies any chest pain, SOB, n/v/d, or urinary symptoms. No melena or BRBPR.   Meds: Eliquis  5 mg BID Lipitor  80 mg daily Digoxin  0.125 mg daily Zetia  10 mg daily Lasix  20 mg daily Metoprolol  25 mg daily Pentoxifylline  400 mg TID with meals Lantus  16u daily  Nitroglycerin  patch Flomax  0.4 mg daily No outpatient medications have been marked as taking for the 09/01/23 encounter [facility] Encounter).    Past Medical History  Past Surgical History:  Procedure Laterality Date   ABDOMINAL AORTOGRAM W/LOWER  EXTREMITY N/A 06/16/2023   Procedure: ABDOMINAL AORTOGRAM W/LOWER EXTREMITY;  Surgeon: Carlene Che, MD;  Location: [facility] INVASIVE CV LAB;  Service: Cardiovascular;  Laterality: N/A;   AMPUTATION Right 06/21/2023   Procedure: RIGHT 2ND RAY AMPUTATION;  Surgeon: Timothy Ford, MD;  Location[facility] OR;  Service: Orthopedics;  Laterality: Right;   DENTAL SURGERY     RADIOACTIVE SEED IMPLANT N/A 02/04/2021   Procedure: RADIOACTIVE SEED IMPLANT/BRACHYTHERAPY IMPLANT;  Surgeon: Lahoma Pigg, MD;  Location: [facility] ORS;  Service: Urology;  Laterality: N/A;   RIGHT/LEFT HEART CATH AND CORONARY ANGIOGRAPHY N/A 05/25/2020   Procedure: RIGHT/LEFT HEART CATH AND CORONARY ANGIOGRAPHY;  Surgeon: Swaziland, Peter M, MD;  Location: [facility] INVASIVE CV LAB;  Service: Cardiovascular;  Laterality: N/A;   RIGHT/LEFT HEART CATH AND CORONARY ANGIOGRAPHY N/A 10/14/2022   Procedure: RIGHT/LEFT HEART CATH AND CORONARY ANGIOGRAPHY;  Surgeon: Mardell Shade, MD;  Location: [facility] INVASIVE CV LAB;  Service: Cardiovascular;  Laterality: N/A;   SPACE OAR INSTILLATION N/A 02/04/2021   Procedure: SPACE OAR INSTILLATION;  Surgeon: Lahoma Pigg, MD;  Location: [facility] ORS;  Service: Urology;  Laterality: N/A;    Social:  Lives at [facility] since February, but previously had unstable housing, and now would be unhoused Support: son and mother Level of Function: independent of ADLs, but mostly wheelchair bound  while at SNF  PCP: Michael Zheng, DO Substances: No alcohol use in 10 years, but 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 Has not smoked a cigarette in 6 years, but smoked 2ppd for ~35 years. No hx of IVDU but "has tried all of that"   Family History: Reviewed, no pertinent updates  Allergies: Allergies as of 09/01/2023   (No Known Allergies)   Review of Systems: A complete ROS was negative except as per HPI.   OBJECTIVE:   Physical Exam: Blood pressure 114/60, pulse 88, temperature 98.3 F (36.8 C), resp. rate 18, height 6'  (1.829 m), weight 74.8 kg, SpO2 100%.   Constitutional: appears well, NAD Cardiovascular: regular rate and rhythm, no m/r/g Pulmonary/Chest: normal work of breathing on room air, lungs clear to auscultation bilaterally Abdominal: soft, non-tender, non-distended MSK: R foot is s/p 1st and 2nd digit amputations with purulent drainage noted at the incision site. Eschar noted proximal to this area. Extremitiy significantly warmer on the right then on the left. Edema on the right. Non-tender.  Neurological: alert & oriented x 3 Psych: depressed mood  Labs: CBC    Component Value Date/Time   WBC 8.0 09/01/2023 1203   RBC 3.30 (L) 09/01/2023 1203   HGB 9.4 (L) 09/01/2023 1203   HCT 28.8 (L) 09/01/2023 1203   PLT 268 09/01/2023 1203   MCV 87.3 09/01/2023 1203   MCH 28.5 09/01/2023 1203   MCHC 32.6 09/01/2023 1203   RDW 16.4 (H) 09/01/2023 1203   LYMPHSABS 1.5 09/01/2023 1203   MONOABS 0.8 09/01/2023 1203   EOSABS 0.1 09/01/2023 1203   BASOSABS 0.0 09/01/2023 1203     CMP     Component Value Date/Time   NA 134 (L) 09/01/2023 1203   NA 135 11/22/2022 1445   K 4.9 09/01/2023 1203   CL 109 09/01/2023 1203   CO2 20 (L) 09/01/2023 1203   GLUCOSE 154 (H) 09/01/2023 1203   BUN 32 (H) 09/01/2023 1203   BUN 24 11/22/2022 1445   CREATININE 1.51 (H) 09/01/2023 1203   CALCIUM  9.3 09/01/2023 1203   PROT 8.5 (H) 09/01/2023 1203   ALBUMIN 3.2 (L) 09/01/2023 1203   AST 15 09/01/2023 1203   ALT 22 09/01/2023 1203   ALKPHOS 72 09/01/2023 1203   BILITOT 0.5 09/01/2023 1203   GFRNONAA 51 (L) 09/01/2023 1203   GFRAA 89 06/10/2020 1645    Imaging: Right foot xray: Status post amputation of first and second toes. Irregularity is seen involving the distal portions of the residual first and second metatarsals concerning for possible osteomyelitis. Moderately displaced distal third metatarsal fracture of indeterminate age.   EKG: not obtained  ASSESSMENT & PLAN:   Assessment & Plan by  Problem: Principal Problem:   Osteomyelitis (HCC)   Derek Thompson is a 63 y.o. person living with a history of HFrEF (EF 20-25%), previous CVA in February, CAD, HTN, HLD, and T2DM who presented with poor wound healing and admitted for osteomyelitis of R foot on hospital day 0  Osteomyelitis of R foot The patient has had poor wound healing and purulent drainage from his previous amputation site. ESR 103 and CRP is 1.5 Completed augmentin  at discharge. Xray notable for osteomyelitis of the distal portions of the residual first and second metatarsals. He received one dose of vanc and zosyn  in the ED. Bp is 114/60, he is not tachypneic, and AAOx3. Afebrile, with a WBC of 8.0. does not look septic. - Ortho consulted, Dr. Julio Ohm to evaluate in  the morning - Hold off on further antibiotics as patient is hemodynamically stable and may want cultures in OR - NPO midnight  Iron Deficiency Anemia  Hgb stable at ~9. RDW increased. Denies any melena or BRBPR. Last iron labs were 06/15/2023 sat ratio was 14 and Ferritin was 222. May benefit from iron supplementation. Will hold until after surgery. Will need age appropriate cancer screening OP  AKI  Cr ~1.1 at baseline. 1.51 now. Could be related to infection. Will encourage PO intake for now. Has HFrEF. Will hold lasix  and spironolactone .   T2DM On lantus  20u daily and humalog SSI. A1c 74mo ago was 13.3 - Carb modified diet, NPO midnight - Hold home lantus  but will start SSI   HFrEF HTN Euvolemic on exam. Continued home digoxin , metoprolol , and nitroglucerin. Will hold lasix ,and spironolactone .  HLD Resumed home lipitor  80 mg daily.   Hx of LV thrombus Holding eliquis , will start Lovenox  1 mg/kg tonight, but holding morning dose in case of surgery tomorrow.   Diet: Carb-Modified, NPO midnight VTE: Enoxaparin  IVF: None Code: Full  Prior to Admission Living Arrangement: SNF,   Anticipated Discharge Location: SNF  Barriers to Discharge: Medical  work up, surgery  Dispo: Admit patient to Inpatient with expected length of stay greater than 2 midnights.  Signed: Obie Kallenbach Alexander-Savino,MD  PGY-1 09/01/2023, 6:44 PM

## 2023-09-01 NOTE — ED Triage Notes (Signed)
 Patient with hx DM arrives for eval of non-healing wound to R foot s/p amputation of great and second toes in February. Completed course of abx without improvement.

## 2023-09-02 DIAGNOSIS — D509 Iron deficiency anemia, unspecified: Secondary | ICD-10-CM | POA: Diagnosis not present

## 2023-09-02 DIAGNOSIS — N179 Acute kidney failure, unspecified: Secondary | ICD-10-CM | POA: Diagnosis not present

## 2023-09-02 DIAGNOSIS — E1169 Type 2 diabetes mellitus with other specified complication: Secondary | ICD-10-CM | POA: Diagnosis not present

## 2023-09-02 DIAGNOSIS — I96 Gangrene, not elsewhere classified: Secondary | ICD-10-CM

## 2023-09-02 DIAGNOSIS — M86671 Other chronic osteomyelitis, right ankle and foot: Secondary | ICD-10-CM | POA: Diagnosis not present

## 2023-09-02 LAB — BASIC METABOLIC PANEL WITH GFR
Anion gap: 7 (ref 5–15)
BUN: 35 mg/dL — ABNORMAL HIGH (ref 8–23)
CO2: 18 mmol/L — ABNORMAL LOW (ref 22–32)
Calcium: 8.7 mg/dL — ABNORMAL LOW (ref 8.9–10.3)
Chloride: 106 mmol/L (ref 98–111)
Creatinine, Ser: 1.57 mg/dL — ABNORMAL HIGH (ref 0.61–1.24)
GFR, Estimated: 49 mL/min — ABNORMAL LOW (ref >60)
Glucose, Bld: 126 mg/dL — ABNORMAL HIGH (ref 70–99)
Potassium: 4.3 mmol/L (ref 3.5–5.1)
Sodium: 131 mmol/L — ABNORMAL LOW (ref 135–145)

## 2023-09-02 LAB — GLUCOSE, CAPILLARY
Glucose-Capillary: 109 mg/dL — ABNORMAL HIGH (ref 70–99)
Glucose-Capillary: 148 mg/dL — ABNORMAL HIGH (ref 70–99)
Glucose-Capillary: 174 mg/dL — ABNORMAL HIGH (ref 70–99)
Glucose-Capillary: 166 mg/dL — ABNORMAL HIGH (ref 70–99)

## 2023-09-02 LAB — C-REACTIVE PROTEIN: CRP: 2.7 mg/dL — ABNORMAL HIGH (ref <1.0)

## 2023-09-02 LAB — CBC
HCT: 26.8 % — ABNORMAL LOW (ref 39.0–52.0)
Hemoglobin: 8.9 g/dL — ABNORMAL LOW (ref 13.0–17.0)
MCH: 28.4 pg (ref 26.0–34.0)
MCHC: 33.2 g/dL (ref 30.0–36.0)
MCV: 85.6 fL (ref 80.0–100.0)
Platelets: 272 10*3/uL (ref 150–400)
RBC: 3.13 MIL/uL — ABNORMAL LOW (ref 4.22–5.81)
RDW: 16.5 % — ABNORMAL HIGH (ref 11.5–15.5)
WBC: 10.3 10*3/uL (ref 4.0–10.5)
nRBC: 0 % (ref 0.0–0.2)

## 2023-09-02 LAB — SEDIMENTATION RATE: Sed Rate: 93 mm/h — ABNORMAL HIGH (ref 0–16)

## 2023-09-02 LAB — HEMOGLOBIN A1C
Hgb A1c MFr Bld: 8.7 % — ABNORMAL HIGH (ref 4.8–5.6)
Mean Plasma Glucose: 202.99 mg/dL

## 2023-09-02 MED ORDER — LACTATED RINGERS IV BOLUS
1000.0000 mL | Freq: Once | INTRAVENOUS | Status: AC
  Administered 2023-09-02: 1000 mL via INTRAVENOUS

## 2023-09-02 MED ORDER — ENOXAPARIN SODIUM 80 MG/0.8ML IJ SOSY
1.0000 mg/kg | PREFILLED_SYRINGE | Freq: Every day | INTRAMUSCULAR | Status: DC
  Administered 2023-09-02 – 2023-09-03 (×2): 75 mg via SUBCUTANEOUS
  Filled 2023-09-02 (×2): qty 0.75

## 2023-09-02 MED ORDER — DOXYCYCLINE HYCLATE 100 MG PO TABS
100.0000 mg | ORAL_TABLET | Freq: Two times a day (BID) | ORAL | Status: DC
  Administered 2023-09-02 – 2023-09-04 (×5): 100 mg via ORAL
  Filled 2023-09-02 (×5): qty 1

## 2023-09-02 NOTE — Progress Notes (Signed)
 HD#1 SUBJECTIVE:  Patient Summary: Derek Thompson is a 63 y.o. person living with a history of HFrEF (EF 20-25%), previous CVA in February, CAD, HTN, HLD, T2DM and currently unhomed who presented with poor wound healing and admitted for osteomyelitis of R foot. Received pip-tazo and vanc for one day at the ED 08/02/2021. Dr. Aniceto Kern pt, Dr. Julio Ohm evaluated today. Pt does not want surgery, can discharge on doxycycline, but has an AKI. He also has had significant chills that improved after the one day of antibiotics.   Overnight Events: NAD  Interim History:   Denies any N/V, chills improved  OBJECTIVE:  Vital Signs: Vitals:   09/01/23 2114 09/01/23 2119 09/02/23 0449 09/02/23 0824  BP: 118/62  114/60 134/66  Pulse: (!) 111 (!) 109 93 93  Resp: 19  18 18   Temp: 98 F (36.7 C)  98.9 F (37.2 C) 98.1 F (36.7 C)  TempSrc: Oral  Oral Oral  SpO2: 100%  96% 99%  Weight:      Height:       Supplemental O2: Room Air SpO2: 99 %  Filed Weights   09/01/23 1143  Weight: 74.8 kg    No intake or output data in the 24 hours ending 09/02/23 1323 Net IO Since Admission: No IO data has been entered for this period [09/02/23 1323]  Physical Exam:  Constitutional: appears well, NAD Cardiovascular: regular rate and rhythm, no m/r/g Pulmonary/Chest: normal work of breathing on room air, lungs clear to auscultation bilaterally Abdominal: soft, non-tender, non-distended MSK: R foot is s/p 1st and 2nd digit amputations. Edema on the right. Non-tender.  Neurological: alert & oriented x 3 Psych: depressed mood  Patient Lines/Drains/Airways Status     Active Line/Drains/Airways     Name Placement date Placement time Site Days   Peripheral IV 09/01/23 22 G Left Antecubital 09/01/23  1717  Antecubital  1   Wound / Incision (Open or Dehisced) 09/01/23 Non-pressure wound;Amputation Foot Anterior;Right amputation of right big toe in february, reddened foot area with purulent drainage present  09/01/23  1721  Foot  1             ASSESSMENT/PLAN:  Assessment: Principal Problem:   Osteomyelitis (HCC) Active Problems:   Gangrene of right foot (HCC)   Plan:  Osteomyelitis of R foot Evaluated by Dr. Julio Ohm today. Pt overall is better today s/p pip tazo and vanc yesterday. Chills have significantly improved (he had 4 blankets yesterday), odor resolved. XR concerning for osteomyelitis. Bone visible on second amputated digit, necrotic eschar, and had purulent discharge yesterday. Pt does not want surgery at this time. Per Dr. Julio Ohm can go on doxycycline with OP follow-up.  - start doxycycline 200mg  BID for 14 days -cw nitroglycerin  patch on the dorsum of his foot -FU with Dr. Julio Ohm OP  AKI  Cr ~1.1 at baseline. 1.57 now. Could be related to infection. Has HFrEF. Will continue holding lasix  and spironolactone . Will give 1L LR today.   Iron Deficiency Anemia  Hgb stable at ~9. RDW increased. Denies any melena or BRBPR. Last iron labs were 06/15/2023 sat ratio was 14 and Ferritin was 222. May benefit from iron supplementation. Will hold until after infection is more controlled. Will need age appropriate cancer screening OP but complicated given that he has so many other problems rights now including his new foot infection and homelessness.    T2DM On lantus  20u daily and humalog SSI. A1c 32mo ago was 13.3 - Carb modified diet - Hold  home lantus  cw SSI    HFrEF HTN Euvolemic on exam. Continued home digoxin , metoprolol , and nitroglycerin . Will hold lasix ,and spironolactone .   HLD Resumed home lipitor  80 mg daily.    Hx of LV thrombus Can cw lovenox . On eliquis  at home.   Diet: Carb-Modified, NPO midnight VTE: Enoxaparin  IVF: 1L LR Code: Full  Signature: Ryleeann Urquiza Alexander-Savino,MD  Internal Medicine Resident, PGY-1 [facility]  1:23 PM, 09/02/2023   Please contact the on call pager at [phone].

## 2023-09-02 NOTE — Plan of Care (Signed)
   Problem: Coping: Goal: Ability to adjust to condition or change in health will improve Outcome: Progressing

## 2023-09-02 NOTE — TOC Progression Note (Addendum)
 Transition of Care [facility]) - Progression Note    Patient Details  Name: Derek Thompson MRN: [id number] Date of Birth: 1960-10-20  Transition of Care [facility]) CM/SW Contact  Claudean Crumbly, LCSWA Phone Number: 09/02/2023, 11:20 AM  Clinical Narrative:     SW received message from RN, reporting pt does not have stable housing and requesting to speak. Per chart review, pt was at [facility].  SW spoke with Shelvy Dickens [facility] [phone]) reports a family meeting was held yesterday at facility and pt was d/c home due to maxing out coverage days (60). Pt was admitted to [facility] 2/27 to 5/2. If pt were to pursue amputation, [facility] can attempt to get auth again as the guidelines for commercial wellness period is not the same as for [facility] plans.   SW met with pt and son Dulce Gibbs at bedside.   Prior to admission and subsequent [facility] stay in February, pt was staying in a hotel and working full time. Demographics confirmed. Pt reports he was not notified of 60 day limit at SNF until day 57, so he did not have time to prepare. Pt's immediate family does not live in [name]. Pt's son Dulce Gibbs visiting from [place] to assist with d/c planning. Pt's mother and brother are traveling today to come to hospital. Pt has a cousin Felipa Horsfall) that lives in [place] that he uses for mail but she is in [employer] ([address]) so pt is not able to stay. Pt's listed address ([address]) is the hotel he was staying at prior to February admission. 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 Pt reports he has spoken to the union rep Lonzo Robinson) at his job and he does still have a job once he is medically stable. Dulce Gibbs has been working on completing a MCD application and disability application. Dulce Gibbs does not want pt to be d/c until a safe plan can be finalized to ensure appropriate healing of pt's wounds as pt and family want to avoid BKA. Pt would  be open to returning to [facility] if arrangements could be made and insurance covered, but pt would need a better d/c plan set up from SNF.      Expected Discharge Plan and Services      Social Determinants of Health (SDOH) Interventions SDOH Screenings   Food Insecurity: No Food Insecurity (09/02/2023)  Housing: High Risk (09/02/2023)  Transportation Needs: Unmet Transportation Needs (09/02/2023)  Utilities: Not At Risk (09/02/2023)  Alcohol Screen: Low Risk  (08/19/2022)  Depression (PHQ2-9): Medium Risk (11/22/2022)  Financial Resource Strain: Low Risk  (08/19/2022)  Tobacco Use: Medium Risk (08/30/2023)    Readmission Risk Interventions     No data to display

## 2023-09-02 NOTE — Consult Note (Addendum)
 ORTHOPAEDIC CONSULTATION  REQUESTING PHYSICIAN: Priscella Brooms, DO  Chief Complaint: Ischemic right foot.  HPI: Derek Thompson is a 64 y.o. male who presents with ischemic right foot.  Patient denies any changes in his symptoms.  Denies any fever or chills or foot pain.  Patient is currently using a nitroglycerin  patch to help with the microcirculation.  Past Medical History:  Diagnosis Date   Anemia    Diabetes mellitus without complication (HCC) 1987   Family history of breast cancer    Hypertension    Myocardial infarction [facility])    Prostate cancer [facility])    Past Surgical History:  Procedure Laterality Date   ABDOMINAL AORTOGRAM W/LOWER EXTREMITY N/A 06/16/2023   Procedure: ABDOMINAL AORTOGRAM W/LOWER EXTREMITY;  Surgeon: Carlene Che, MD;  Location: [facility] INVASIVE CV LAB;  Service: Cardiovascular;  Laterality: N/A;   AMPUTATION Right 06/21/2023   Procedure: RIGHT 2ND RAY AMPUTATION;  Surgeon: Timothy Ford, MD;  Location: [facility] OR;  Service: Orthopedics;  Laterality: Right;   DENTAL SURGERY     RADIOACTIVE SEED IMPLANT N/A 02/04/2021   Procedure: RADIOACTIVE SEED IMPLANT/BRACHYTHERAPY IMPLANT;  Surgeon: Lahoma Pigg, MD;  Location: [facility] ORS;  Service: Urology;  Laterality: N/A;   RIGHT/LEFT HEART CATH AND CORONARY ANGIOGRAPHY N/A 05/25/2020   Procedure: RIGHT/LEFT HEART CATH AND CORONARY ANGIOGRAPHY;  Surgeon: Swazil[place], Peter M, MD;  Location: [facility] INVASIVE CV LAB;  Service: Cardiovascular;  Laterality: N/A;   RIGHT/LEFT HEART CATH AND CORONARY ANGIOGRAPHY N/A 10/14/2022   Procedure: RIGHT/LEFT HEART CATH AND CORONARY ANGIOGRAPHY;  Surgeon: Mardell Shade, MD;  Location: [facility] INVASIVE CV LAB;  Service: Cardiovascular;  Laterality: N/A;   SPACE OAR INSTILLATION N/A 02/04/2021   Procedure: SPACE OAR INSTILLATION;  Surgeon: Lahoma Pigg, MD;  Location: [facility] ORS;  Service: Urology;  Laterality: N/A;   Social History   Socioeconomic History   Marital status: Divorced    Spouse name:  Not on file   Number of children: 3   Years of education: Not on file   Highest education level: Associate degree: academic program  Occupational History   Occupation: [employer]  Tobacco Use   Smoking status: Former    Current packs/day: 0.00    Average packs/day: 1 pack/day for 43.0 years (43.0 ttl pk-yrs)    Types: Cigarettes    Start date: 05/22/1974    Quit date: 05/22/2017    Years since quitting: 6.2   Smokeless tobacco: Never   Tobacco comments:    Quit 2019 about 1PPD  Vaping Use   Vaping status: Never Used  Substance and Sexual Activity   Alcohol use: Not Currently    Comment: last drink 2015   Drug use: Not Currently    Comment: none in 7 years   Sexual activity: Not Currently  Other Topics Concern   Not on file  Social History Narrative   3 sons   Social Drivers of Corporate investment banker Strain: Low Risk  (08/19/2022)   Overall Financial Resource Strain (CARDIA)    Difficulty of Paying Living Expenses: Not very hard  Food Insecurity: No Food Insecurity (06/14/2023)   Hunger Vital Sign    Worried About Running Out of Food in the Last Year: Never true    Ran Out of Food in the Last Year: Never true  Transportation Needs: No Transportation Needs (06/14/2023)   PRAPARE - Administrator, Civil Service (Medical): No    Lack of Transportation (Non-Medical): No  Physical  Activity: Not on file  Stress: Not on file  Social Connections: Not on file   Family History  Problem Relation Age of Onset   Breast cancer Mother        dx 30s/40s, twice   Cancer Maternal Grandfather        unknown type, d. >50   - negative except otherwise stated in the family history section No Known Allergies Prior to Admission medications   Medication Sig Start Date End Date Taking? Authorizing Provider  apixaban  (ELIQUIS ) 5 MG TABS tablet Take 1 tablet (5 mg total) by mouth 2 (two) times daily. 06/29/23  Yes Cala Castleman, MD  atorvastatin  (LIPITOR ) 40 MG tablet Take 2  tablets (80 mg total) by mouth daily. 06/29/23  Yes Cala Castleman, MD  digoxin  (LANOXIN ) 0.125 MG tablet Take 1 tablet (0.125 mg total) by mouth daily. 09/07/22  Yes Ruddy Corral M, PA-C  ezetimibe  (ZETIA ) 10 MG tablet Take 1 tablet (10 mg total) by mouth daily. 11/25/22  Yes Zheng, Michael, DO  ferrous sulfate  325 (65 FE) MG tablet TAKE 1 TABLET BY MOUTH EVERY DAY 09/20/22  Yes Sridharan, Sriramkumar, MD  furosemide  (LASIX ) 20 MG tablet Take 1 tablet (20 mg total) by mouth daily. 10/05/22 10/05/23 Yes Bensimhon, Rheta Celestine, MD  gabapentin  (NEURONTIN ) 300 MG capsule TAKE 1 CAPSULE BY MOUTH TWICE A DAY 09/29/22  Yes Sridharan, Sriramkumar, MD  insulin  aspart (NOVOLOG ) 100 UNIT/ML FlexPen Before each meal 3 times a day, 140-199 - 2 units, 200-250 - 4 units, 251-299 - 6 units,  300-349 - 8 units,  350 or above 10 units. Patient taking differently: 2-10 Units 3 (three) times daily with meals. Before each meal 3 times a day, 140-199 - 2 units, 200-250 - 4 units, 251-299 - 6 units,  300-349 - 8 units,  350 or above 10 units. 06/29/23  Yes Singh, Prashant K, MD  insulin  glargine (LANTUS  SOLOSTAR) 100 UNIT/ML Solostar Pen INJECT 16 UNITS INTO THE SKIN AT BEDTIME. Patient taking differently: 20 Units at bedtime. Hold if blood sugar less than 120 10/10/22  Yes Sridharan, Sriramkumar, MD  loperamide  (IMODIUM ) 2 MG capsule Take 2 capsules (4 mg total) by mouth every 6 (six) hours as needed for diarrhea or loose stools. 06/29/23  Yes Singh, Prashant K, MD  metoprolol  succinate (TOPROL -XL) 25 MG 24 hr tablet TAKE 1 TABLET BY MOUTH DAILY 09/12/22  Yes Sridharan, Sriramkumar, MD  Multiple Vitamin (MULTIVITAMIN) tablet Take 1 tablet by mouth daily.   Yes [provider]  nitroGLYCERIN  (NITRODUR - DOSED IN MG/24 HR) 0.2 mg/hr patch Place 1 patch (0.2 mg total) onto the skin daily. 08/25/23  Yes Reuben Castilla, NP  pentoxifylline  (TRENTAL ) 400 MG CR tablet Take 1 tablet (400 mg total) by mouth 3 (three) times daily  with meals. 08/25/23  Yes Reuben Castilla, NP  spironolactone  (ALDACTONE ) 25 MG tablet Take 1 tablet (25 mg total) by mouth daily. 10/05/22  Yes Bensimhon, Rheta Celestine, MD  tamsulosin  (FLOMAX ) 0.4 MG CAPS capsule TAKE 1 CAPSULE BY MOUTH EVERYDAY AT BEDTIME 09/12/22  Yes Sridharan, Sriramkumar, MD  Accu-Chek Softclix Lancets lancets SMARTSIG:Topical 1-4 Times Daily 08/20/22   Atway, Rayann N, DO  amoxicillin -clavulanate (AUGMENTIN ) 875-125 MG tablet Take 1 tablet by mouth every 12 (twelve) hours. Patient not taking: Reported on 09/01/2023 08/11/23   Reuben Castilla, NP  blood glucose meter kit and supplies KIT Dispense based on patient and insurance preference. Use up to four times daily as directed. (  FOR ICD-9 250.00, 250.01). 08/20/22   Atway, Rayann N, DO  glucose blood (CONTOUR NEXT TEST) test strip Use to check blood sugar up to 3 times daily as instructed 11/22/22   Jearldine Mina, DO   DG Foot 2 Views Right Result Date: 09/01/2023 CLINICAL DATA:  Nonhealing wounds.  Diabetes. EXAM: RIGHT FOOT - 2 VIEW COMPARISON:  June 14, 2023. FINDINGS: Status post amputation of the first and second toes. Irregularity is seen involving the distal portions of the residual first and second metatarsals concerning for possible osteomyelitis. Moderately displaced distal third metatarsal fracture is noted of indeterminate age. IMPRESSION: Status post amputation of first and second toes. Irregularity is seen involving the distal portions of the residual first and second metatarsals concerning for possible osteomyelitis. Moderately displaced distal third metatarsal fracture of indeterminate age. Electronically Signed   By: Rosalene Colon M.D.   On: 09/01/2023 12:54   - pertinent xrays, CT, MRI studies were reviewed and independently interpreted  Positive ROS: All other systems have been reviewed and were otherwise negative with the exception of those mentioned in the HPI and as above.  Physical Exam: General: Alert, no acute  distress Psychiatric: Patient is competent for consent with normal mood and affect Lymphatic: No axillary or cervical lymphadenopathy Cardiovascular: No pedal edema Respiratory: No cyanosis, no use of accessory musculature GI: No organomegaly, abdomen is soft and non-tender    Images:  @ENCIMAGES @  Labs:  Lab Results  Component Value Date   HGBA1C 8.7 (H) 09/02/2023   HGBA1C 13.3 (H) 06/13/2023   HGBA1C 11.4 (A) 11/22/2022   ESRSEDRATE 93 (H) 09/02/2023   ESRSEDRATE 103 (H) 09/01/2023   CRP 2.7 (H) 09/02/2023   CRP 1.5 (H) 09/01/2023   CRP 1.3 (H) 06/29/2023   REPTSTATUS 06/24/2023 FINAL 06/21/2023   GRAMSTAIN  06/21/2023    RARE WBC PRESENT, PREDOMINANTLY PMN RARE GRAM POSITIVE COCCI IN PAIRS    CULT  06/21/2023    RARE STAPHYLOCOCCUS AUREUS FEW STREPTOCOCCUS ANGINOSIS RARE BACTEROIDES FRAGILIS RARE BACTEROIDES THETAIOTAOMICRON BETA LACTAMASE POSITIVE Performed at [facility], [address] [place], [name] [address]    [facility] STAPHYLOCOCCUS AUREUS 06/21/2023   LABORGA STREPTOCOCCUS ANGINOSIS 06/21/2023    Lab Results  Component Value Date   ALBUMIN 3.2 (L) 09/01/2023   ALBUMIN <1.5 (L) 06/24/2023   ALBUMIN 1.6 (L) 06/20/2023        Latest Ref Rng & Units 09/02/2023    6:15 AM 09/01/2023   12:03 PM 06/29/2023    4:47 AM  CBC EXTENDED  WBC 4.0 - 10.5 K/uL 10.3  8.0  8.6   RBC 4.22 - 5.81 MIL/uL 3.13  3.30  3.30   Hemoglobin 13.0 - 17.0 g/dL 8.9  9.4  9.1   HCT 60.4 - 52.0 % 26.8  28.8  28.1   Platelets 150 - 400 K/uL 272  268  213   NEUT# 1.7 - 7.7 K/uL  5.6  6.8   Lymph# 0.7 - 4.0 K/uL  1.5  1.1     Neurologic: Patient does not have protective sensation bilateral lower extremities.   MUSCULOSKELETAL:   Skin: Examination patient has ischemic eschar over the medial and lateral aspect of the right foot.  The foot is thin and atrophic.  There is no cellulitis fluctuance or purulent drainage.  There is a nitroglycerin  patch on the dorsum of his  foot.  Ankle-brachial indices shows falsely elevated circulation to the ankle.  Hemoglobin 8.9 white cell count 10.3.  Sed  rate 93 with a C-reactive protein of 2.7.  Review of the radiographs shows destructive bony changes of the first metatarsal consistent with osteomyelitis.   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  Plan: Plan: Patient does not want to consider further surgical intervention at this time.  Ideally patient will require a below-knee amputation.  Patient may continue the nitroglycerin  patch change daily, continue dry dressing changes daily discharge on oral antibiotics, doxycycline 100 mg twice daily, and I will follow-up in the office in 1 week.  Thank you for the consult and the opportunity to see Mr. Jasim Melito, MD [facility] [phone] 9:58 AM

## 2023-09-03 ENCOUNTER — Encounter (HOSPITAL_COMMUNITY): Payer: Self-pay | Admitting: Internal Medicine

## 2023-09-03 DIAGNOSIS — M86671 Other chronic osteomyelitis, right ankle and foot: Secondary | ICD-10-CM | POA: Diagnosis not present

## 2023-09-03 LAB — CBC WITH DIFFERENTIAL/PLATELET
Abs Immature Granulocytes: 0.02 10*3/uL (ref 0.00–0.07)
Basophils Absolute: 0 10*3/uL (ref 0.0–0.1)
Basophils Relative: 0 %
Eosinophils Absolute: 0.2 10*3/uL (ref 0.0–0.5)
Eosinophils Relative: 2 %
HCT: 25.5 % — ABNORMAL LOW (ref 39.0–52.0)
Hemoglobin: 8.5 g/dL — ABNORMAL LOW (ref 13.0–17.0)
Immature Granulocytes: 0 %
Lymphocytes Relative: 20 %
Lymphs Abs: 1.5 10*3/uL (ref 0.7–4.0)
MCH: 28.1 pg (ref 26.0–34.0)
MCHC: 33.3 g/dL (ref 30.0–36.0)
MCV: 84.4 fL (ref 80.0–100.0)
Monocytes Absolute: 0.9 10*3/uL (ref 0.1–1.0)
Monocytes Relative: 12 %
Neutro Abs: 5 10*3/uL (ref 1.7–7.7)
Neutrophils Relative %: 66 %
Platelets: 291 10*3/uL (ref 150–400)
RBC: 3.02 MIL/uL — ABNORMAL LOW (ref 4.22–5.81)
RDW: 16.4 % — ABNORMAL HIGH (ref 11.5–15.5)
WBC: 7.6 10*3/uL (ref 4.0–10.5)
nRBC: 0 % (ref 0.0–0.2)

## 2023-09-03 LAB — BASIC METABOLIC PANEL WITH GFR
Anion gap: 8 (ref 5–15)
BUN: 41 mg/dL — ABNORMAL HIGH (ref 8–23)
CO2: 18 mmol/L — ABNORMAL LOW (ref 22–32)
Calcium: 8.9 mg/dL (ref 8.9–10.3)
Chloride: 111 mmol/L (ref 98–111)
Creatinine, Ser: 1.57 mg/dL — ABNORMAL HIGH (ref 0.61–1.24)
GFR, Estimated: 49 mL/min — ABNORMAL LOW (ref >60)
Glucose, Bld: 126 mg/dL — ABNORMAL HIGH (ref 70–99)
Potassium: 4.3 mmol/L (ref 3.5–5.1)
Sodium: 137 mmol/L (ref 135–145)

## 2023-09-03 LAB — GLUCOSE, CAPILLARY
Glucose-Capillary: 125 mg/dL — ABNORMAL HIGH (ref 70–99)
Glucose-Capillary: 220 mg/dL — ABNORMAL HIGH (ref 70–99)
Glucose-Capillary: 126 mg/dL — ABNORMAL HIGH (ref 70–99)
Glucose-Capillary: 211 mg/dL — ABNORMAL HIGH (ref 70–99)

## 2023-09-03 MED ORDER — APIXABAN 5 MG PO TABS
5.0000 mg | ORAL_TABLET | Freq: Two times a day (BID) | ORAL | Status: DC
  Administered 2023-09-03 – 2023-09-04 (×2): 5 mg via ORAL
  Filled 2023-09-03 (×2): qty 1

## 2023-09-03 MED ORDER — APIXABAN 5 MG PO TABS: 5.0000 mg | ORAL_TABLET | Freq: Two times a day (BID) | ORAL | Status: DC

## 2023-09-03 NOTE — Plan of Care (Signed)

## 2023-09-03 NOTE — Progress Notes (Signed)
   HD#2 SUBJECTIVE:  Patient Summary: Derek Thompson is a 63 y.o. 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   Overnight Events: No events overnight  Interim History: The patient denies any pain, as he does not have any feeling in his RLE. Denies n/v and feels okay overall.   OBJECTIVE:  Vital Signs: Vitals:   09/02/23 0449 09/02/23 0824 09/02/23 1543 09/02/23 2017  BP: 114/60 134/66 (!) 118/58 124/61  Pulse: 93 93 88 93  Resp: 18 18 19 16   Temp: 98.9 F (37.2 C) 98.1 F (36.7 C) 98.3 F (36.8 C) 98.5 F (36.9 C)  TempSrc: Oral Oral Oral Oral  SpO2: 96% 99% 100% 99%  Weight:      Height:       Supplemental O2: Room Air SpO2: 99 %  Filed Weights   09/01/23 1143  Weight: 74.8 kg    No intake or output data in the 24 hours ending 09/03/23 0701 Net IO Since Admission: No IO data has been entered for this period [09/03/23 0701]  Physical Exam: General: Pleasant, appears well, NAD Pulmonary: Normal work of breathing on room air Extremities: R foot is s/p 1st and 2nd digit amputations. Wound is dressed with no active purulence/drainage noted.  Neuro: A&Ox4  Psych: Normal mood and affect     ASSESSMENT/PLAN:  Assessment: Principal Problem:   Osteomyelitis (HCC) Active Problems:   Gangrene of right foot (HCC)   Plan: Osteomyelitis of R foot Patient is s/p 1 dose of zosyn  in the ED and was transitioned to doxycyline. Evaluated by ortho who recommended BKA, however, patient declined any further surgical interventions. He is medically stable for discharge, however, is unhomed and is still working on safe dispo plan.  - Continue doxycycline 100 mg BID x 2 week course - F/u outpatient with Dr. Julio Ohm in 1 week - Continue nitroglycerine patch daily  - Daily dry dressing changes  - Gabapentin  300 mg BID  AKI Cr  remains elevated at 1.57- unchanged from admission, but up from baseline of 1.1. He received 1 L IVF yesterday. Continue to hold lasix  and spiro.  - Daily BMP - Avoid nephrotoxins  HFrEF HTN - Holding home lasix  and spiro as above - Continue metoprolol  25 mg daily - Continue digoxin  0.125 mg daily   Hx of LV thrombus - Discontinued therapeutic lovenox  and resumed home eliquis  5 mg bid  Iron deficiency anemia Hb remains stable and no evidence of bleeding. Not UTD with age appropriate cancer screenings, as patient has many barriers to healthcare, as he is unhomed.   T2DM A1c 8.7%. CBGs well controlled in 120-160s.  - Continue SSI  - Holding lantus  20u daily   Best Practice: Diet: Diabetic diet IVF: Fluids: none VTE: Eliquis  Code: Full AB: Doxy Therapy Recs: Pending Family Contact: son, to be notified. DISPO: Anticipated discharge tomorrow  pending  safe dispo plan/placement .  Signature: Adron Geisel, D.O.  Internal Medicine Resident, PGY-3 [facility]  Pager: [phone] 7:01 AM, 09/03/2023   Please contact the on call pager after 5 pm and on weekends at [phone].

## 2023-09-03 NOTE — Evaluation (Signed)
 Physical Therapy Evaluation Patient Details Name: Derek Thompson MRN: [id number] DOB: Dec 06, 1960 Today's Date: 09/03/2023  History of Present Illness  Derek Thompson is a 63 y.o.  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 Comes from [facility]  SNF; Prior to admission, pt was working on [employer] RLE with RW, and wheelchair mobility to keep NWB for limb salvage; Presents to PT with weight bearing restrictions and limitations in functional mobility; Needing min assist to stand and take pivot steps on LLE only to the recliner;  Pt currently with functional limitations due to the deficits listed below (see PT Problem List). Perhaps his biggest obstacle for leaving the hospital is that he is unhoused;  Pt will benefit from skilled PT to increase their independence and safety with mobility to allow discharge to the venue listed below.           If plan is discharge home, recommend the following: A little help with walking and/or transfers;A little help with bathing/dressing/bathroom   Can travel by private Programmer, multimedia (measurements PT);Wheelchair cushion (measurements PT);BSC/3in1;Rolling walker (2 wheels)  Recommendations for Other Services       Functional Status Assessment Patient has had a recent decline in their functional status and demonstrates the ability to make significant improvements in function in a reasonable and predictable amount of time.     Precautions / Restrictions Precautions Precautions: Fall Recall of Precautions/Restrictions: Intact Precaution/Restrictions Comments: Fall risk is present, but minimal Restrictions Weight Bearing Restrictions Per Provider Order: Yes RLE  Weight Bearing Per Provider Order: Non weight bearing Other Position/Activity Restrictions: NWB fo rlimb slavage      Mobility  Bed Mobility Overal bed mobility: Modified Independent                  Transfers Overall transfer level: Needs assistance Equipment used: Rolling walker (2 wheels) Transfers: Sit to/from Stand, Bed to chair/wheelchair/BSC Sit to Stand: Min assist   Step pivot transfers: Contact guard assist       General transfer comment: Min assist to steday during sit to stand, as he pulled on it to stand; good, steady steps bed to recliner, with good suport of body weigh ton RW while advancing L foot; kept NWB RLE well    Ambulation/Gait               General Gait Details: Pt politely declined amb today  Stairs            Wheelchair Mobility     Tilt Bed    Modified Rankin (Stroke Patients Only)       Balance                                             Pertinent Vitals/Pain Pain Assessment Pain Assessment: No/denies pain    Home Living Family/patient expects to be discharged to:: Unsure                   Additional Comments: Pt hopes to be able to find housing with assistance from the [place]  Prior Function Prior Level of Function : Needs assist             Mobility Comments: was working on wheelchair mobility at rehab ctr ADLs Comments: Assist     Extremity/Trunk Assessment   Upper Extremity Assessment Upper Extremity Assessment: Overall WFL for tasks assessed    Lower Extremity Assessment Lower Extremity Assessment: RLE deficits/detail RLE Deficits / Details: wound on foot dressed and wrapped    Cervical / Trunk Assessment Cervical / Trunk Assessment: Normal  Communication   Communication Communication: No apparent difficulties    Cognition   Behavior During Therapy: WFL for tasks assessed/performed   PT - Cognitive impairments: No apparent impairments                          Following commands: Intact       Cueing Cueing Techniques: Verbal cues     General Comments General comments (skin integrity, edema, etc.): Needa slolid plan for housing    Exercises     Assessment/Plan    PT Assessment Patient needs continued PT services  PT Problem List Decreased strength;Decreased range of motion;Decreased activity tolerance;Decreased balance;Decreased coordination;Decreased mobility;Decreased knowledge of use of DME;Decreased safety awareness;Decreased knowledge of precautions       PT Treatment Interventions DME instruction;Gait training;Stair training;Functional mobility training;Therapeutic activities;Therapeutic exercise;Balance training;Cognitive remediation;Patient/family education;Wheelchair mobility training    PT Goals (Current goals can be found in the Care Plan section)  Acute Rehab PT Goals Patient Stated Goal: Agreeabel to getting up and OOB PT Goal Formulation: With patient Time For Goal Achievement: 09/17/23 Potential to Achieve Goals: Good    Frequency Min 3X/week     Co-evaluation               AM-PAC PT "6 Clicks" Mobility  Outcome Measure Help needed turning from your back to your side while in a flat bed without using bedrails?: None Help needed moving from lying on your back to sitting on the side of a flat bed without using bedrails?: None Help needed moving to and from a bed to a chair (including a wheelchair)?: A Little Help needed standing up from a chair using your arms (e.g., wheelchair or bedside chair)?: A Little Help needed to walk in hospital room?: A Little Help needed climbing 3-5 steps with a railing? : A Lot 6 Click Score: 19    End of Session Equipment Utilized During Treatment: Gait belt Activity Tolerance: Patient tolerated treatment well Patient left: in chair;with call bell/phone within reach Nurse Communication: Mobility status PT Visit Diagnosis: Unsteadiness on feet (R26.81);Other  abnormalities of gait and mobility (R26.89)    Time: 6213-0865 PT Time Calculation (min) (ACUTE ONLY): 17 min   Charges:   PT Evaluation $PT Eval Low Complexity: 1 Low PT Treatments $Therapeutic Activity: 8-22 mins PT General Charges $$ ACUTE PT VISIT: 1 Visit         Darcus Eastern, PT  Acute Rehabilitation Services Office [phone] Secure Chat welcomed   Marcial Setting 09/03/2023, 4:55 PM

## 2023-09-03 NOTE — Plan of Care (Signed)
   Problem: Education: Goal: Ability to describe self-care measures that may prevent or decrease complications (Diabetes Survival Skills Education) will improve Outcome: Progressing

## 2023-09-04 ENCOUNTER — Other Ambulatory Visit (HOSPITAL_COMMUNITY): Payer: Self-pay

## 2023-09-04 ENCOUNTER — Telehealth (HOSPITAL_COMMUNITY): Payer: Self-pay | Admitting: Pharmacy Technician

## 2023-09-04 DIAGNOSIS — M86171 Other acute osteomyelitis, right ankle and foot: Secondary | ICD-10-CM | POA: Diagnosis not present

## 2023-09-04 DIAGNOSIS — E1169 Type 2 diabetes mellitus with other specified complication: Secondary | ICD-10-CM | POA: Diagnosis not present

## 2023-09-04 LAB — CBC
HCT: 26.6 % — ABNORMAL LOW (ref 39.0–52.0)
Hemoglobin: 8.6 g/dL — ABNORMAL LOW (ref 13.0–17.0)
MCH: 27.6 pg (ref 26.0–34.0)
MCHC: 32.3 g/dL (ref 30.0–36.0)
MCV: 85.3 fL (ref 80.0–100.0)
Platelets: 335 10*3/uL (ref 150–400)
RBC: 3.12 MIL/uL — ABNORMAL LOW (ref 4.22–5.81)
RDW: 16.4 % — ABNORMAL HIGH (ref 11.5–15.5)
WBC: 7.1 10*3/uL (ref 4.0–10.5)
nRBC: 0 % (ref 0.0–0.2)

## 2023-09-04 LAB — BASIC METABOLIC PANEL WITH GFR
Anion gap: 9 (ref 5–15)
BUN: 41 mg/dL — ABNORMAL HIGH (ref 8–23)
CO2: 17 mmol/L — ABNORMAL LOW (ref 22–32)
Calcium: 8.9 mg/dL (ref 8.9–10.3)
Chloride: 110 mmol/L (ref 98–111)
Creatinine, Ser: 1.47 mg/dL — ABNORMAL HIGH (ref 0.61–1.24)
GFR, Estimated: 53 mL/min — ABNORMAL LOW (ref >60)
Glucose, Bld: 158 mg/dL — ABNORMAL HIGH (ref 70–99)
Potassium: 4.4 mmol/L (ref 3.5–5.1)
Sodium: 136 mmol/L (ref 135–145)

## 2023-09-04 LAB — DIGOXIN LEVEL: Digoxin Level: 0.7 ng/mL — ABNORMAL LOW (ref 0.8–2.0)

## 2023-09-04 LAB — GLUCOSE, CAPILLARY
Glucose-Capillary: 152 mg/dL — ABNORMAL HIGH (ref 70–99)
Glucose-Capillary: 204 mg/dL — ABNORMAL HIGH (ref 70–99)

## 2023-09-04 MED ORDER — METFORMIN HCL 500 MG PO TABS
500.0000 mg | ORAL_TABLET | Freq: Every day | ORAL | 0 refills | Status: DC
  Filled 2023-09-04: qty 30, 30d supply, fill #0

## 2023-09-04 MED ORDER — DOXYCYCLINE HYCLATE 100 MG PO TABS
100.0000 mg | ORAL_TABLET | Freq: Two times a day (BID) | ORAL | 0 refills | Status: AC
  Filled 2023-09-04: qty 23, 12d supply, fill #0

## 2023-09-04 MED ORDER — DAPAGLIFLOZIN PROPANEDIOL 10 MG PO TABS
10.0000 mg | ORAL_TABLET | Freq: Every day | ORAL | 2 refills | Status: DC
  Filled 2023-09-04: qty 30, 30d supply, fill #0

## 2023-09-04 NOTE — Discharge Instructions (Signed)
 Please contact the [facility] for a follow-up appointment: Phone number- [phone].

## 2023-09-04 NOTE — Progress Notes (Signed)
 Physical Therapy Treatment Patient Details Name: Derek Thompson MRN: [id number] DOB: 03/09/1961 Today's Date: 09/04/2023   History of Present Illness Derek Thompson is a 63 y.o.  who presented with poor wound healing and admitted for osteomyelitis of R foot; BKA recommended, but pt choosing to try limb salvage (will proceed NWB); with a history of HFrEF (EF 20-25%), previous CVA in February, CAD, HTN, HLD, T2DM and currently unhomed    PT Comments  Continuing work on functional mobility and activity tolerance;  session focused on wheelchair mobility and management in prep for leaving the hospital; Overall pt performing functional tasks well, and maintaining NWB RLE well; Pt voices relative confidence in his ability to be ok at DC, but given his current housing situation, I'm concerned for K[place]ion's RLE healing   If plan is discharge home, recommend the following: A little help with walking and/or transfers;A little help with bathing/dressing/bathroom   Can travel by private Theme park manager (measurements PT);Wheelchair cushion (measurements PT);BSC/3in1;Rolling walker (2 wheels)    Recommendations for Other Services       Precautions / Restrictions Precautions Precautions: Fall Recall of Precautions/Restrictions: Intact Restrictions RLE Weight Bearing Per Provider Order: Non weight bearing Other Position/Activity Restrictions: NWB fo limb slavage     Mobility  Bed Mobility Overal bed mobility: Modified Independent                  Transfers Overall transfer level: Needs assistance Equipment used: Rolling walker (2 wheels) Transfers: Sit to/from Stand, Bed to chair/wheelchair/BSC Sit to Stand: Contact guard assist   Step pivot transfers: Contact guard assist       General transfer comment: Transferred recliner to [facility] with Contact Guard Assist; Stood from wheelchair to RW with good use of armrests    Ambulation/Gait Ambulation/Gait  assistance: Contact guard assist Gait Distance (Feet): 10 Feet (x2) Assistive device: Rolling walker (2 wheels), Crutches Gait Pattern/deviations: Step-to pattern       General Gait Details: walked with RW and with crutches; Ultimately, pt more steady and less fall risk with RW   Stairs         General stair comments: We discussed (and this PT demonstrated multiple ways to ascend and descend stairs; Pt verbalized Financial controller mobility: Yes Wheelchair propulsion: Both upper extremities, Left lower extremity Wheelchair parts: Supervision/cueing Distance: 120 Wheelchair Assistance Details (indicate cue type and reason): Showed wheelchair features, brakes, legrest management to pt and he was able to return demonstrate; navigating well in hallways   Tilt Bed    Modified Rankin (Stroke Patients Only)       Balance                                            Communication Communication Communication: No apparent difficulties  Cognition Arousal: Alert Behavior During Therapy: WFL for tasks assessed/performed                             Following commands: Intact      Cueing Cueing Techniques: Verbal cues  Exercises      General Comments General comments (skin integrity, edema, etc.): needs a solid plan for housing; Son arrived towards the end of session      Pertinent Vitals/Pain Pain Assessment Pain  Assessment: No/denies pain    Home Living Family/patient expects to be discharged to:: Unsure                   Additional Comments: Pt hopes to be able to find housing with assistance from the [place]. Per RN, TOC looking for temporary housing on pt behalf    Prior Function            PT Goals (current goals can now be found in the care plan section) Acute Rehab PT Goals PT Goal Formulation: With patient Time For Goal Achievement: 09/17/23 Potential to Achieve Goals:  Good Progress towards PT goals: Progressing toward goals    Frequency    Min 3X/week      PT Plan      Co-evaluation              AM-PAC PT "6 Clicks" Mobility   Outcome Measure  Help needed turning from your back to your side while in a flat bed without using bedrails?: None Help needed moving from lying on your back to sitting on the side of a flat bed without using bedrails?: None Help needed moving to and from a bed to a chair (including a wheelchair)?: A Little Help needed standing up from a chair using your arms (e.g., wheelchair or bedside chair)?: A Little Help needed to walk in hospital room?: A Little Help needed climbing 3-5 steps with a railing? : A Lot 6 Click Score: 19    End of Session Equipment Utilized During Treatment: Gait belt Activity Tolerance: Patient tolerated treatment well Patient left: in chair;with call bell/phone within reach Nurse Communication: Mobility status PT Visit Diagnosis: Unsteadiness on feet (R26.81);Other abnormalities of gait and mobility (R26.89)     Time: 1610-9604 PT Time Calculation (min) (ACUTE ONLY): 27 min  Charges:    $Self Care/Home Management: 8-22 $Wheel Chair Management: 8-22 mins PT General Charges $$ ACUTE PT VISIT: 1 Visit                     Darcus Eastern, PT  Acute Rehabilitation Services Office [phone] Secure Chat welcomed    Marcial Setting 09/04/2023, 2:30 PM

## 2023-09-04 NOTE — Evaluation (Signed)
 Occupational Therapy Evaluation Patient Details Name: Derek Thompson MRN: [id number] DOB: [birth year] Today's Date: 09/04/2023   History of Present Illness   Derek Thompson is a 63 y.o.  who presented with poor wound healing and admitted for osteomyelitis of R foot; BKA recommended, but pt choosing to try limb salvage (will proceed NWB); with a history of HFrEF (EF 20-25%), previous CVA in February, CAD, HTN, HLD, T2DM and currently unhomed     Clinical Impressions PTA, pt recently at [employer] working on wheelchair mobility and return to ADL. Upon eval, per chart, pt out of rehab days per insurance. Pt currently with decreased functional mobility secondary to NWB status as well as decr ADL performance requiring up to [facility] for BADL. Pt un-housed currently raising concerns for ability to perform appropriate hygiene in light of wound on RLE; TOC following. OT to continue to follow acutely, but do not suspect OT specific needs outside of already recommended OP PT at discharge.      If plan is discharge home, recommend the following:   A little help with walking and/or transfers;A little help with bathing/dressing/bathroom;Assistance with cooking/housework;Assist for transportation;Help with stairs or ramp for entrance     Functional Status Assessment   Patient has had a recent decline in their functional status and demonstrates the ability to make significant improvements in function in a reasonable and predictable amount of time.     Equipment Recommendations   Wheelchair (measurements OT);Wheelchair cushion (measurements OT) (if dc to place with tub will require tub/bench for bathing)     Recommendations for Other Services         Precautions/Restrictions   Precautions Precautions: Fall Recall of Precautions/Restrictions: Intact Restrictions Weight Bearing Restrictions Per Provider Order: Yes RLE Weight Bearing Per Provider Order: Non weight bearing Other  Position/Activity Restrictions: NWB fo limb slavage     Mobility Bed Mobility Overal bed mobility: Modified Independent                  Transfers Overall transfer level: Needs assistance Equipment used: Rolling walker (2 wheels) Transfers: Sit to/from Stand, Bed to chair/wheelchair/BSC Sit to Stand: Min assist     Step pivot transfers: Contact guard assist     General transfer comment: Min assist to steday during sit to stand, as he pulled on it to stand; good, steady steps bed to recliner, with good suport of body weigh ton RW while advancing L foot; kept NWB RLE well      Balance                                           ADL either performed or assessed with clinical judgement   ADL                                               Vision Baseline Vision/History: 1 Wears glasses Ability to See in Adequate Light: 0 Adequate Patient Visual Report: No change from baseline Vision Assessment?: No apparent visual deficits Additional Comments: not formally assessed     Perception         Praxis         Pertinent Vitals/Pain Pain Assessment Pain Assessment: No/denies pain     Extremity/Trunk Assessment Upper Extremity Assessment Upper Extremity  Assessment: Overall WFL for tasks assessed   Lower Extremity Assessment Lower Extremity Assessment: Defer to PT evaluation   Cervical / Trunk Assessment Cervical / Trunk Assessment: Normal   Communication Communication Communication: No apparent difficulties   Cognition Arousal: Alert Behavior During Therapy: WFL for tasks assessed/performed Cognition: No family/caregiver present to determine baseline             OT - Cognition Comments: able to provide thorough PLOF, has been managing own bills and medications. Perhaps some decr health literacy regarding certain topics. pleasant conversationally but flat affect                 Following commands: Intact        Cueing  General Comments   Cueing Techniques: Verbal cues  needs a solid plan for housing   Exercises     Shoulder Instructions      Home Living Family/patient expects to be discharged to:: Unsure                                 Additional Comments: Pt hopes to be able to find housing with assistance from the [place]. Per RN, TOC looking for temporary housing on pt behalf      Prior Functioning/Environment Prior Level of Function : Needs assist             Mobility Comments: was working on wheelchair mobility at rehab ctr ADLs Comments: Pt reports being able to do BADL, was performing LB dressing with lateral leans.    OT Problem List: Decreased strength;Decreased activity tolerance;Impaired balance (sitting and/or standing);Decreased safety awareness;Decreased knowledge of use of DME or AE;Pain   OT Treatment/Interventions: Self-care/ADL training;Therapeutic exercise;DME and/or AE instruction;Balance training;Patient/family education;Therapeutic activities      OT Goals(Current goals can be found in the care plan section)   Acute Rehab OT Goals Patient Stated Goal: save RLE OT Goal Formulation: With patient Time For Goal Achievement: 09/18/23 Potential to Achieve Goals: Good   OT Frequency:  Min 1X/week    Co-evaluation              AM-PAC OT "6 Clicks" Daily Activity     Outcome Measure Help from another person eating meals?: None Help from another person taking care of personal grooming?: A Little Help from another person toileting, which includes using toliet, bedpan, or urinal?: A Little Help from another person bathing (including washing, rinsing, drying)?: A Little Help from another person to put on and taking off regular upper body clothing?: A Little Help from another person to put on and taking off regular lower body clothing?: A Little 6 Click Score: 19   End of Session Equipment Utilized During Treatment: Gait belt;Rolling  walker (2 wheels) Nurse Communication: Mobility status  Activity Tolerance: Patient tolerated treatment well Patient left: in chair;with call bell/phone within reach  OT Visit Diagnosis: Unsteadiness on feet (R26.81);Muscle weakness (generalized) (M62.81);Pain;Other (comment) (decr knowledge of precautions) Pain - Right/Left: Right Pain - part of body: Ankle and joints of foot                Time: 1610-9604 OT Time Calculation (min): 21 min Charges:  OT General Charges $OT Visit: 1 Visit OT Evaluation $OT Eval Low Complexity: 1 Low  Karilyn Ouch, OTR/L [facility] Acute Rehabilitation Office: [phone]   Derek Thompson 09/04/2023, 2:14 PM

## 2023-09-04 NOTE — Discharge Summary (Addendum)
 Name: Derek Thompson MRN: [id number] DOB: 29-May-1960 63 y.o. PCP: Jearldine Mina, DO  Date of Admission: 09/01/2023 11:36 AM Date of Discharge:  09/04/2023 Attending Physician: Dr. Bettejane Brownie  DISCHARGE DIAGNOSIS:  Primary Problem: Osteomyelitis [facility])   Hospital Problems: Principal Problem:   Osteomyelitis (HCC) Active Problems:   Gangrene of right foot (HCC)    DISCHARGE MEDICATIONS:   Allergies as of 09/04/2023   No Known Allergies      Medication List     PAUSE taking these medications    furosemide  20 MG tablet Wait to take this until your doctor or other care provider tells you to start again. Commonly known as: Lasix  Take 1 tablet (20 mg total) by mouth daily.   spironolactone  25 MG tablet Wait to take this until your doctor or other care provider tells you to start again. Commonly known as: ALDACTONE  Take 1 tablet (25 mg total) by mouth daily.       STOP taking these medications    amoxicillin -clavulanate 875-125 MG tablet Commonly known as: AUGMENTIN    insulin  aspart 100 UNIT/ML FlexPen Commonly known as: NOVOLOG    Lantus  SoloStar 100 UNIT/ML Solostar Pen Generic drug: insulin  glargine       TAKE these medications    Accu-Chek Softclix Lancets lancets SMARTSIG:Topical 1-4 Times Daily   apixaban  5 MG Tabs tablet Commonly known as: ELIQUIS  Take 1 tablet (5 mg total) by mouth 2 (two) times daily.   atorvastatin  40 MG tablet Commonly known as: LIPITOR  Take 2 tablets (80 mg total) by mouth daily.   blood glucose meter kit and supplies Kit Dispense based on patient and insurance preference. Use up to four times daily as directed. (FOR ICD-9 250.00, 250.01).   Contour Next Test test strip Generic drug: glucose blood Use to check blood sugar up to 3 times daily as instructed   dapagliflozin  propanediol 10 MG Tabs tablet Commonly known as: Farxiga  Take 1 tablet (10 mg total) by mouth daily before breakfast.   digoxin  0.125 MG  tablet Commonly known as: LANOXIN  Take 1 tablet (0.125 mg total) by mouth daily.   doxycycline 100 MG tablet Commonly known as: VIBRA-TABS Take 1 tablet (100 mg total) by mouth every 12 (twelve) hours for 12 days.   ezetimibe  10 MG tablet Commonly known as: ZETIA  Take 1 tablet (10 mg total) by mouth daily.   ferrous sulfate  325 (65 FE) MG tablet TAKE 1 TABLET BY MOUTH EVERY DAY   gabapentin  300 MG capsule Commonly known as: NEURONTIN  TAKE 1 CAPSULE BY MOUTH TWICE A DAY   loperamide  2 MG capsule Commonly known as: IMODIUM  Take 2 capsules (4 mg total) by mouth every 6 (six) hours as needed for diarrhea or loose stools.   metFORMIN  500 MG tablet Commonly known as: GLUCOPHAGE  Take 1 tablet (500 mg total) by mouth daily with breakfast.   metoprolol  succinate 25 MG 24 hr tablet Commonly known as: TOPROL -XL TAKE 1 TABLET BY MOUTH DAILY   multivitamin tablet Take 1 tablet by mouth daily.   nitroGLYCERIN  0.2 mg/hr patch Commonly known as: NITRODUR - Dosed in mg/24 hr Place 1 patch (0.2 mg total) onto the skin daily.   pentoxifylline  400 MG CR tablet Commonly known as: TRENTAL  Take 1 tablet (400 mg total) by mouth 3 (three) times daily with meals.   tamsulosin  0.4 MG Caps capsule Commonly known as: FLOMAX  TAKE 1 CAPSULE BY MOUTH EVERYDAY AT BEDTIME  Durable Medical Equipment  (From admission, onward)           Start     Ordered   09/04/23 1111  For home use only DME standard manual wheelchair with seat cushion  Once       Comments: Patient suffers from History of CVA, L foot osteomyelitis which impairs their ability to perform daily activities like mobility in the home.  A walker will not resolve issue with performing activities of daily living. A wheelchair will allow patient to safely perform daily activities. Patient can safely propel the wheelchair in the home or has a caregiver who can provide assistance. Length of need 12 months. Accessories:  elevating leg rests (ELRs), wheel locks, extensions and anti-tippers.   09/04/23 1111   09/04/23 1109  For home use only DME Walker rolling  Once       Question Answer Comment  Walker: With 5 Inch Wheels   Patient needs a walker to treat with the following condition CVA (cerebral vascular accident) [facility])   Patient needs a walker to treat with the following condition Foot osteomyelitis, right (HCC)      09/04/23 1111              Discharge Care Instructions  (From admission, onward)           Start     Ordered   09/04/23 0000  Leave dressing on - Keep it clean, dry, and intact until clinic visit        09/04/23 1118            DISPOSITION AND FOLLOW-UP:  [name] was discharged from [facility] in [place] condition. At the hospital follow up visit please address:  Osteomyelitis of the first metatarsal, right foot: Declined surgical intervention, discharged on doxycycline 100 mg for 2 weeks.  Ensure follow-up with Dr. Julio Ohm in 1 week.  Evaluate foot for signs of worsening infection.   Type 2 diabetes: Last hemoglobin A1c 8.7%.  Poorly adherent with insulin , has fear of using insulin  so we started him on 500 mg of metformin .  Please titrate this up outpatient.  Consider other oral antiglycemic agents as indicated.  Acute kidney injury: Baseline creatinine around 1.1, downtrending upon discharge.  Recommend rechecking kidney function outpatient.  HFrEF: Spironolactone  and diuretic held in the setting of acute kidney injury.  Recommend close outpatient follow-up with heart failure team.  Ensure he makes this appointment.  Consider restarting these medications with improvement of creatinine.  Follow-up Appointments:  Follow-up Information     Jonelle Neri, DO. Go on 09/11/2023.   Specialty: Internal Medicine Why: Please go to your hospital follow-up appointment on 09/11/2023 at 1:15 PM Contact information: [address] [name]  [address] [phone]         Reuben Castilla, NP. Go on 09/08/2023.   Specialty: Orthopedic Surgery Why: Get your appointment on 09/08/2023 at 9:00 AM Contact information: [address] [place]  [name] [address] [phone]         [facility]. Schedule an appointment as soon as possible for a visit.   Specialty: Cardiology Why: Please call and make an appointment with the heart failure team within the next week or two manage your heart medications. Contact information: [address] [place]  [phone]                HOSPITAL COURSE:  Patient Summary: Osteomyelitis of R foot  The patient is s/p right foot 1st and 2nd metatarsal amputations in February. He presented with poor wound healing and purulent drainage/exposed bone from his previous amputation site, in addition to increased swelling in that area. He received a dose of zosyn  in the ED and ortho was consulted, who recommended BKA, however, the patient is not interested in pursuing this. He remained medically stable and was transitioned to doxycycline 100 mg BID and will complete a two week course of this, with ortho follow up in one week. He is also recommended to follow up with the [facility].   Iron Deficiency Anemia  Hgb stable at ~9. RDW increased. Denies any melena or BRBPR. Last iron labs were 06/15/2023 sat ratio was 14 and Ferritin was 222. Will need age appropriate cancer screening OP.  AKI  Cr ~1.1 at baseline and up to 1.5 here, now downtrending.  Normal urine output.  Hx of LV thrombus Resumed home eliquis  5 mg BID.   T2DM A1c 8.7%. On lantus  20u daily and humalog SSI at home, CBGs well controlled with SSI while inpatient.  Has not been adherent to Lantus  at home due to fear of using insulin  from prior episodes where he accidentally used too much insulin .  May benefit from further diabetes education.  Previously used metformin  with no  concerns.  SGLT2 considered given heart failure, however it is too expensive for him.  HFrEF HTN Euvolemic on exam. Continued home digoxin  and metoprolol . Held lasix  and spironolactone  given AKI.  Recommend close follow-up with heart failure team outpatient.  HLD Resumed home lipitor  80 mg daily.  SDoH The patient is currently unhoused and has been [facility] with the [facility] to obtain housing. He had been staying at a SNF for the last 60 days after his amputation, however, they notified him that he maxed out his days and he was discharged on 5/2, the day he presented to the ED.     DISCHARGE INSTRUCTIONS:   Discharge Instructions     Call MD for:  difficulty breathing, headache or visual disturbances   Complete by: As directed    Call MD for:  extreme fatigue   Complete by: As directed    Call MD for:  persistant dizziness or light-headedness   Complete by: As directed    Call MD for:  persistant nausea and vomiting   Complete by: As directed    Call MD for:  redness, tenderness, or signs of infection (pain, swelling, redness, odor or green/yellow discharge around incision site)   Complete by: As directed    Call MD for:  severe uncontrolled pain   Complete by: As directed    Call MD for:  temperature >100.4   Complete by: As directed    Diet - low sodium heart healthy   Complete by: As directed    Diet Carb Modified   Complete by: As directed    Discharge instructions   Complete by: As directed    It was a [facility] taking care of [facility] at [facility].  You were found to have an infection in your right foot and were treated with antibiotics. We have sent you out of the hospital with 2 weeks of oral antibiotics.    You will have follow-up with the podiatrist (Dr. Julio Ohm) in one week.   Please call the heart failure team at the number listed on your discharge paperwork to schedule an appointment for follow-up in the next week or two.  We have also scheduled  a  hospital follow-up appointment in our internal medicine clinic for primary care.  It is important that you make it to these appointments.  For your foot infection, we have prescribed you an antibiotic called doxycycline, which you should take once in the morning and once in the evening for the next 2 weeks.  For your diabetes, we have started you on metformin , once daily.  When you meet with your primary care provider, they may go up on this medication for better control of your blood sugars.  We will hold off on restarting insulin  for now since you are hesitant to use it.  Please stop taking your spironolactone , Lasix , and insulin  for now.  Your outpatient doctors will discuss these medications with you and may restart them or offer alternatives.  If you have any significant increased swelling or pain, redness traveling up your leg, fevers or chills, please return to the emergency department for evaluation.  Otherwise, you can follow-up with your primary care provider outpatient.  If you have any questions about any of this, feel free to call our internal medicine clinic at [phone].   Increase activity slowly   Complete by: As directed    Leave dressing on - Keep it clean, dry, and intact until clinic visit   Complete by: As directed        SUBJECTIVE:   Doing well this morning, no concerns.  Says his son is coming to see him today.  Ready for discharge with outpatient follow-up.  Discharge Vitals:   BP 119/69   Pulse 90   Temp 98 F (36.7 C) (Oral)   Resp 18   Ht 6' (1.829 m)   Wt 74.8 kg   SpO2 99%   BMI 22.38 kg/m   OBJECTIVE:  Physical Exam Constitutional:      Appearance: Normal appearance.  HENT:     Mouth/Throat:     Mouth: Mucous membranes are dry.  Eyes:     Extraocular Movements: Extraocular movements intact.     Pupils: Pupils are equal, round, and reactive to light.  Cardiovascular:     Rate and Rhythm: Normal rate and regular rhythm.     Heart sounds:  Normal heart sounds.  Pulmonary:     Effort: Pulmonary effort is normal.  Abdominal:     General: Abdomen is flat.  Musculoskeletal:        General: Normal range of motion.     Cervical back: Normal range of motion and neck supple.     Comments: Right foot wounds unchanged with no evidence of progression of infection. See media tab.  Skin:    General: Skin is warm and dry.  Neurological:     General: No focal deficit present.     Mental Status: He is alert and oriented to person, place, and time.  Psychiatric:        Mood and Affect: Mood normal.        Behavior: Behavior normal.     Pertinent Labs, Studies, and Procedures:     Latest Ref Rng & Units 09/04/2023    6:36 AM 09/03/2023    6:03 AM 09/02/2023    6:15 AM  CBC  WBC 4.0 - 10.5 K/uL 7.1  7.6  10.3   Hemoglobin 13.0 - 17.0 g/dL 8.6  8.5  8.9   Hematocrit 39.0 - 52.0 % 26.6  25.5  26.8   Platelets 150 - 400 K/uL 335  291  272  Latest Ref Rng & Units 09/04/2023    6:36 AM 09/03/2023    6:03 AM 09/02/2023    6:15 AM  CMP  Glucose 70 - 99 mg/dL 295  284  132   BUN 8 - 23 mg/dL 41  41  35   Creatinine 0.61 - 1.24 mg/dL 4.40  1.02  7.25   Sodium 135 - 145 mmol/L 136  137  131   Potassium 3.5 - 5.1 mmol/L 4.4  4.3  4.3   Chloride 98 - 111 mmol/L 110  111  106   CO2 22 - 32 mmol/L 17  18  18    Calcium  8.9 - 10.3 mg/dL 8.9  8.9  8.7     DG Foot 2 Views Right Result Date: 09/01/2023 IMPRESSION: Status post amputation of first and second toes. Irregularity is seen involving the distal portions of the residual first and second metatarsals concerning for possible osteomyelitis. Moderately displaced distal third metatarsal fracture of indeterminate age. Electronically Signed   By: Rosalene Colon M.D.   On: 09/01/2023 12:54     Signed: Dorthy Gavia, MD Internal Medicine Resident, PGY-1 [facility]  Pager: [phone] 12:57 PM, 09/04/2023

## 2023-09-04 NOTE — TOC Progression Note (Addendum)
 Transition of Care [facility]) - Progression Note    Patient Details  Name: Derek Thompson MRN: [id number] Date of Birth: 02-01-1961  Transition of Care [facility]) CM/SW Contact  Alisa App, RN Phone Number: 09/04/2023, 10:47 AM  Clinical Narrative:    Admitted for poor wound healing / osteomyelitis of R foot. Homeless. NCM made [facility] transfer care coordinator April aware of current hospital stay. April provided NCM with SW contact infor for pt with [facility] PCP : Huey Madrid [phone], ext. [phone] Fudge [place] [phone], ext. [place]; Wed- Sat. [employer] [phone], ext. [phone]; Arlys Lamer [phone], ext. [phone].  Voice message left with Bertell Broach [phone], ext. [phone] regarding disposition  need, homeless/ foot wound.  [facility] [employer] for [facility] [phone], ext. [phone] called  for assistance, voice message  left.  Pt states states he has reached out to a [place] SW , Sharri Dee [phone]) who is helping with admission to the [facility]. NCM Monique , voice message left.  09/04/2023 @11 :50 Referral made with April / [employer] for DME: RW and RW. Equipment will be delivered to bedside prior to d/c  [facility] team following and will assist with needs....  Expected Discharge Plan: Home/Self Care Barriers to Discharge: Homeless with medical needs  Expected Discharge Plan and Services     Post Acute Care Choice: Skilled Nursing Facility Living arrangements for the past 2 months: Skilled Nursing Facility                                       Social Determinants of Health (SDOH) Interventions SDOH Screenings   Food Insecurity: No Food Insecurity (09/02/2023)  Housing: High Risk (09/02/2023)  Transportation Needs: Unmet Transportation Needs (09/02/2023)  Utilities: Not At Risk (09/02/2023)  Alcohol Screen: Low Risk  (08/19/2022)  Depression (PHQ2-9): Medium Risk (11/22/2022)  Financial Resource Strain: Low Risk  (08/19/2022)   Tobacco Use: Medium Risk (09/03/2023)    Readmission Risk Interventions     No data to display

## 2023-09-04 NOTE — Telephone Encounter (Signed)
 Patient Product/process development scientist completed.    The patient is insured through [facility]. Patient has [employer], may use a copay card, and/or apply for patient assistance if available.    Ran test claim for Farxiga  10 mg and the current 30 day co-pay is $145.31.  Ran test claim for Jardiance  10 mg and the current 30 day co-pay is $152.51.  This test claim was processed through [facility]- copay amounts may vary at other pharmacies due to pharmacy/plan contracts, or as the patient moves through the different stages of their insurance plan.     Morgan Arab, CPHT Pharmacy Technician III Certified Patient Advocate [facility] Patient Advocate Team Direct Number: [phone]  Fax: [phone]

## 2023-09-04 NOTE — TOC Transition Note (Signed)
 Transition of Care [facility]) - Discharge Note   Patient Details  Name: Derek Thompson MRN: [id number] Date of Birth: 1961-04-05  Transition [facility]) CM/SW Contact:  Alisa App, RN Phone Number: 09/04/2023, 4:02 PM   Clinical Narrative:    Patient will DC to: ? Family/friend , shelter Anticipated DC date: 09/04/2023 Family notified: yes Transport by: car  Presented with poor wound healing / osteomyelitis of R foot;  Per MD patient ready for DC today. RN, patient, and patient's family aware of DC.  States [place] in [place] States has a cousin in [place]. Pt states not sure where he is going but he will figure it out. Shelter resources provided if needed. [facility] [name] states they will f/u with pt once bed available @ [facility], pt is aware. Pt without transportation issues.  Pt without RX med concerns. Post hospital f/u noted on AVS. Pt declined RW. Pt receive W/C from April with [employer].  RNCM will sign off for now as intervention is no longer needed. Please consult us  again if new needs arise.    Final next level of care: Home/Self Care Barriers to Discharge: No Barriers Identified   Patient Goals and CMS Choice   CMS Medicare.gov Compare Post Acute Care list provided to:: Patient Choice offered to / list presented to : Patient      Discharge Placement      Discharge Plan and Services Additional resources added to the After Visit Summary for     Discharge Planning Services: CM Consult Post Acute Care Choice: Skilled Nursing Facility          DME Arranged: Walker rolling, Wheelchair manual, Pt declined RW 2/2OUT OF POCKET EXPENSE DME Agency: [employer] Date DME Agency Contacted: 09/04/23 Time DME Agency Contacted: 1152 Representative spoke with at DME Agency: [name]            Social Drivers of Health (SDOH) Interventions SDOH Screenings   Food Insecurity: No Food Insecurity (09/02/2023)  Housing: High Risk (09/02/2023)   Transportation Needs: Unmet Transportation Needs (09/02/2023)  Utilities: Not At Risk (09/02/2023)  Alcohol Screen: Low Risk  (08/19/2022)  Depression (PHQ2-9): Medium Risk (11/22/2022)  Financial Resource Strain: Low Risk  (08/19/2022)  Tobacco Use: Medium Risk (09/03/2023)     Readmission Risk Interventions     No data to display

## 2023-09-04 NOTE — Progress Notes (Signed)
    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  A walker will not resolve issue with performing activities of daily living. A wheelchair will allow patient to safely perform daily activities. Patient can safely propel the wheelchair in the home or has a caregiver who can provide assistance. Length of need 12 months. Accessories: elevating leg rests (ELRs), wheel locks, extensions and anti-tippers.   09/04/23 1111   09/04/23 1109  For home use only DME Walker rolling  Once       Question Answer Comment  Walker: With 5 Inch Wheels   Patient needs a walker to treat with the following condition CVA (cerebral vascular accident) [facility])   Patient needs a walker to treat with the following condition Foot osteomyelitis, right (HCC)      09/04/23 1111

## 2023-09-08 ENCOUNTER — Encounter: Admitting: Family

## 2023-09-11 ENCOUNTER — Encounter: Payer: Self-pay | Admitting: Student

## 2023-09-11 ENCOUNTER — Ambulatory Visit (INDEPENDENT_AMBULATORY_CARE_PROVIDER_SITE_OTHER): Payer: Self-pay | Admitting: Student

## 2023-09-11 VITALS — BP 126/66 | HR 106 | Temp 97.7°F | Ht 72.0 in | Wt 155.4 lb

## 2023-09-11 DIAGNOSIS — I5022 Chronic systolic (congestive) heart failure: Secondary | ICD-10-CM

## 2023-09-11 DIAGNOSIS — I4729 Other ventricular tachycardia: Secondary | ICD-10-CM

## 2023-09-11 DIAGNOSIS — E782 Mixed hyperlipidemia: Secondary | ICD-10-CM

## 2023-09-11 DIAGNOSIS — M86671 Other chronic osteomyelitis, right ankle and foot: Secondary | ICD-10-CM

## 2023-09-11 DIAGNOSIS — E1100 Type 2 diabetes mellitus with hyperosmolarity without nonketotic hyperglycemic-hyperosmolar coma (NKHHC): Secondary | ICD-10-CM | POA: Diagnosis not present

## 2023-09-11 DIAGNOSIS — D509 Iron deficiency anemia, unspecified: Secondary | ICD-10-CM

## 2023-09-11 DIAGNOSIS — Z122 Encounter for screening for malignant neoplasm of respiratory organs: Secondary | ICD-10-CM | POA: Insufficient documentation

## 2023-09-11 DIAGNOSIS — Z59819 Housing instability, housed unspecified: Secondary | ICD-10-CM

## 2023-09-11 DIAGNOSIS — N179 Acute kidney failure, unspecified: Secondary | ICD-10-CM

## 2023-09-11 DIAGNOSIS — Z7984 Long term (current) use of oral hypoglycemic drugs: Secondary | ICD-10-CM

## 2023-09-11 DIAGNOSIS — I240 Acute coronary thrombosis not resulting in myocardial infarction: Secondary | ICD-10-CM

## 2023-09-11 MED ORDER — METFORMIN HCL 500 MG PO TABS
1000.0000 mg | ORAL_TABLET | Freq: Two times a day (BID) | ORAL | 11 refills | Status: DC
Start: 2023-09-11 — End: 2024-01-09

## 2023-09-11 MED ORDER — METOPROLOL SUCCINATE ER 25 MG PO TB24: ORAL_TABLET | ORAL | 1 refills | Status: DC

## 2023-09-11 MED ORDER — EZETIMIBE 10 MG PO TABS: 10.0000 mg | ORAL_TABLET | Freq: Every day | ORAL | 11 refills | Status: DC

## 2023-09-11 MED ORDER — TAMSULOSIN HCL 0.4 MG PO CAPS: 0.4000 mg | ORAL_CAPSULE | Freq: Every day | ORAL | 1 refills | Status: DC

## 2023-09-11 MED ORDER — APIXABAN 5 MG PO TABS: 5.0000 mg | ORAL_TABLET | Freq: Two times a day (BID) | ORAL | 2 refills | Status: DC

## 2023-09-11 MED ORDER — ATORVASTATIN CALCIUM 80 MG PO TABS: 80.0000 mg | ORAL_TABLET | Freq: Every day | ORAL | 3 refills | Status: DC

## 2023-09-11 MED ORDER — FERROUS SULFATE 325 (65 FE) MG PO TABS: 325.0000 mg | ORAL_TABLET | Freq: Every day | ORAL | 1 refills | Status: DC

## 2023-09-11 MED ORDER — GABAPENTIN 300 MG PO CAPS: 300.0000 mg | ORAL_CAPSULE | Freq: Two times a day (BID) | ORAL | 11 refills | Status: DC

## 2023-09-11 MED ORDER — DAPAGLIFLOZIN PROPANEDIOL 10 MG PO TABS: 10.0000 mg | ORAL_TABLET | Freq: Every day | ORAL | 11 refills | Status: DC

## 2023-09-11 NOTE — Assessment & Plan Note (Addendum)
 Patient most recent A1c 8.7 on Sep 02 2023.  He is currently on metformin , and ramping up to 1000 mg twice daily.  Will need strict glucose control to make sure foot infection does not worsen.  Plan: - Titrate metformin  up to 1000 mg twice daily - ACR at next visit -Continue Farxiga  10 mg daily

## 2023-09-11 NOTE — Assessment & Plan Note (Signed)
 Patient referred to social work for housing instability.

## 2023-09-11 NOTE — Assessment & Plan Note (Signed)
 Patient has chronic osteo of right toe.  He was to follow-up with orthopedics, but missed appointment due to lack of transportation.  He was instructed to reschedule.  He is on doxycycline  for now.  He is to follow with orthopedics.  Will change dressing today.  Please see pictures in chart.  Plan: - Strict glucose control with goal less than 180 - Follow-up with orthopedics - Daily wound changes - Will come to clinic weekly for wound checks

## 2023-09-11 NOTE — Assessment & Plan Note (Signed)
 Unfortunately, patient has not been adherent to his Eliquis .  He has lost his Eliquis  after being hospitalized multiple times.  Will reinitiate today.  Fortunately, patient has not had any strokelike symptoms.  Plan: - Resume Eliquis  5 mg twice daily

## 2023-09-11 NOTE — Assessment & Plan Note (Signed)
 Patient had AKI while inpatient.  Will recheck BMP today.  Plan: - Follow-up BMP -Holding Lasix  and spironolactone 

## 2023-09-11 NOTE — Patient Instructions (Addendum)
 Derek Thompson, [name] you for allowing me to take part in your care today.  Here are your instructions.  1.  Here is a list of all your medicines you should be taking.  Left ventricular thrombus: Eliquis  5 mg twice daily Hyperlipidemia: Atorvastatin  80 mg daily, Zetia  10 mg daily Diabetes: Farxiga  10 mg daily, metformin  1000 mg twice daily  Nonsustained ventricular tachycardia: Digoxin  0.125 mg daily Osteomyelitis: Doxycycline  100 mg every 12 hours Heart failure: Farxiga  10 mg daily, metoprolol  succinate 25 mg daily Iron deficiency: Ferrous sulfate  325 mg daily Neuropathy: Gabapentin  300 mg twice daily BPH: Flomax  0.4 mg nightly  2.  Please come back in 1 week for wound check.  3.  Please call your orthopedic doctor for evaluation outpatient.  4.  I will call you with the results of your labs.  5.  I have referred you for a eye exam.  I have also ordered a CT scan to evaluate for lung cancer.  Thank you, Dr. Lydia Sams  If you have any other questions please contact the internal medicine clinic at [phone] If it is after hours, please call the [facility] at [phone] and then ask the person who picks up for the resident on call.

## 2023-09-11 NOTE — Assessment & Plan Note (Signed)
 Refilled atorvastatin  and Zetia  today.  Plan: - Continue atorvastatin  80 mg daily - Continue Zetia  10 mg daily

## 2023-09-11 NOTE — Assessment & Plan Note (Signed)
 Patient states he has not had any palpitations.  He has been adherent to his digoxin .  Plan: - Continue digoxin  0.125 mg daily

## 2023-09-11 NOTE — Assessment & Plan Note (Signed)
Refilled iron supplementation today.

## 2023-09-11 NOTE — Assessment & Plan Note (Signed)
 CT scan ordered for lung cancer screening

## 2023-09-11 NOTE — Progress Notes (Signed)
 CC: Hospital follow-up  HPI:  [name] is a 63 y.o. male with a past medical history of CVA, HFrEF, CAD, type 2 diabetes who presents for hospital follow-up.  Patient was recently hospitalized for osteomyelitis.  Admission dates are between 05/02-05/05.  Patient has recommended BKA, but decided against this.  He also had an AKI.  Patient was to hold spironolactone  and Lasix  at discharge.  He was also discharged on doxycycline .  Medications: Left ventricular thrombus: Eliquis  5 mg twice daily Hyperlipidemia: Atorvastatin  80 mg daily, Zetia  10 mg daily Diabetes: Farxiga  10 mg daily, metformin  500 mg daily Nonsustained ventricular tachycardia: Digoxin  0.125 mg daily Osteomyelitis: Doxycycline  100 mg every 12 hours HFrEF: Farxiga  10 mg daily, metoprolol  succinate 25 mg daily Iron deficiency: Ferrous sulfate  325 mg daily Neuropathy: Gabapentin  300 mg twice daily Diarrhea: Loperamide  4 mg every 6 hours as needed BPH: Flomax  0.4 mg nightly  Past Medical History:  Diagnosis Date   Anemia    Diabetes mellitus without complication (HCC) 1987   Family history of breast cancer    Hypertension    Myocardial infarction [facility])    Prostate cancer (HCC)      Current Outpatient Medications:    Accu-Chek Softclix Lancets lancets, SMARTSIG:Topical 1-4 Times Daily, Disp: 100 each, Rfl: 0   apixaban  (ELIQUIS ) 5 MG TABS tablet, Take 1 tablet (5 mg total) by mouth 2 (two) times daily., Disp: 60 tablet, Rfl: 2   atorvastatin  (LIPITOR ) 80 MG tablet, Take 1 tablet (80 mg total) by mouth daily., Disp: 90 tablet, Rfl: 3   blood glucose meter kit and supplies KIT, Dispense based on patient and insurance preference. Use up to four times daily as directed. (FOR ICD-9 250.00, 250.01)., Disp: 1 each, Rfl: 0   dapagliflozin  propanediol (FARXIGA ) 10 MG TABS tablet, Take 1 tablet (10 mg total) by mouth daily before breakfast., Disp: 30 tablet, Rfl: 11   digoxin  (LANOXIN ) 0.125 MG tablet, Take 1 tablet  (0.125 mg total) by mouth daily., Disp: 90 tablet, Rfl: 3   doxycycline  (VIBRA -TABS) 100 MG tablet, Take 1 tablet (100 mg total) by mouth every 12 (twelve) hours for 12 days., Disp: 23 tablet, Rfl: 0   ezetimibe  (ZETIA ) 10 MG tablet, Take 1 tablet (10 mg total) by mouth daily., Disp: 30 tablet, Rfl: 11   ferrous sulfate  325 (65 FE) MG tablet, Take 1 tablet (325 mg total) by mouth daily., Disp: 90 tablet, Rfl: 1   [Paused] furosemide  (LASIX ) 20 MG tablet, Take 1 tablet (20 mg total) by mouth daily., Disp: 30 tablet, Rfl: 11   gabapentin  (NEURONTIN ) 300 MG capsule, Take 1 capsule (300 mg total) by mouth 2 (two) times daily., Disp: 60 capsule, Rfl: 11   glucose blood (CONTOUR NEXT TEST) test strip, Use to check blood sugar up to 3 times daily as instructed, Disp: 100 each, Rfl: 11   loperamide  (IMODIUM ) 2 MG capsule, Take 2 capsules (4 mg total) by mouth every 6 (six) hours as needed for diarrhea or loose stools., Disp: , Rfl:    metFORMIN  (GLUCOPHAGE ) 500 MG tablet, Take 2 tablets (1,000 mg total) by mouth 2 (two) times daily with a meal., Disp: 120 tablet, Rfl: 11   metoprolol  succinate (TOPROL -XL) 25 MG 24 hr tablet, TAKE 1 TABLET BY MOUTH DAILY, Disp: 90 tablet, Rfl: 1   Multiple Vitamin (MULTIVITAMIN) tablet, Take 1 tablet by mouth daily., Disp: , Rfl:    nitroGLYCERIN  (NITRODUR - DOSED IN MG/24 HR) 0.2 mg/hr patch, Place 1 patch (  0.2 mg total) onto the skin daily., Disp: 30 patch, Rfl: 12   pentoxifylline  (TRENTAL ) 400 MG CR tablet, Take 1 tablet (400 mg total) by mouth 3 (three) times daily with meals., Disp: 90 tablet, Rfl: 3   [Paused] spironolactone  (ALDACTONE ) 25 MG tablet, Take 1 tablet (25 mg total) by mouth daily., Disp: 90 tablet, Rfl: 3   tamsulosin  (FLOMAX ) 0.4 MG CAPS capsule, Take 1 capsule (0.4 mg total) by mouth daily after supper., Disp: 90 capsule, Rfl: 1  Review of Systems:   Negative except for what is stated in HPI  Physical Exam:  Vitals:   09/11/23 1306  BP: 126/66   Pulse: (!) 106  Temp: 97.7 F (36.5 C)  TempSrc: Oral  SpO2: 100%  Weight: 155 lb 6.4 oz (70.5 kg)  Height: 6' (1.829 m)    General: Patient is sitting comfortably in the room  Head: Normocephalic, atraumatic  Cardio: Regular rate and rhythm, no murmurs, rubs or gallops Pulmonary: Clear to ausculation bilaterally with no rales, rhonchi, and crackles  Extremities: Right lower extremity with obvious great toe amputation and second toe amputation.  None healing wound to right lower extremity.  Faint pulses.        Assessment & Plan:   Chronic osteomyelitis of toe, right (HCC) Patient has chronic osteo of right toe.  He was to follow-up with orthopedics, but missed appointment due to lack of transportation.  He was instructed to reschedule.  He is on doxycycline  for now.  He is to follow with orthopedics.  Will change dressing today.  Please see pictures in chart.  Plan: - Strict glucose control with goal less than 180 - Follow-up with orthopedics - Daily wound changes - Will come to clinic weekly for wound checks  AKI (acute kidney injury) [facility]) Patient had AKI while inpatient.  Will recheck BMP today.  Plan: - Follow-up BMP -Holding Lasix  and spironolactone   Chronic HFrEF (heart failure with reduced ejection fraction) (HCC) Patient has a past medical history of HFrEF with EF of 25% found in February 2025.  Patient was on GDMT, but patient has not been adherent to his medications as he lost some of his medications after losing his hotel room.  He used to be on Entresto  97-103 mg twice daily, metoprolol  succinate 25 mg daily, spironolactone  25 mg daily, digoxin  0.125 mg daily, and Lasix  20 mg as needed.  He has not been taking any of his medications except for Farxiga  10 mg daily.  Will need to reintroduce GDMT slowly.  Blood pressure today is 126/66.  Patient looks euvolemic.  Plan: - Continue to hold spironolactone  - Hold Lasix  - Resume metoprolol  succinate 25 mg  daily -Continue Farxiga  10 mg daily - Follow-up in 1 week to continue to titrate GDMT - Refill sent to pharmacy - Follow-up BMP  DM2 (diabetes mellitus, type 2) (HCC) Patient most recent A1c 8.7 on Sep 02 2023.  He is currently on metformin , and ramping up to 1000 mg twice daily.  Will need strict glucose control to make sure foot infection does not worsen.  Plan: - Titrate metformin  up to 1000 mg twice daily - ACR at next visit -Continue Farxiga  10 mg daily  Screening for lung cancer CT scan ordered for lung cancer screening  Housing instability Patient referred to social work for housing instability.  NSVT (nonsustained ventricular tachycardia) (HCC) Patient states he has not had any palpitations.  He has been adherent to his digoxin .  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  He has lost his Eliquis  after being hospitalized multiple times.  Will reinitiate today.  Fortunately, patient has not had any strokelike symptoms.  Plan: - Resume Eliquis  5 mg twice daily  Iron deficiency anemia Refilled iron supplementation today.  Mixed hyperlipidemia Refilled atorvastatin  and Zetia  today.  Plan: - Continue atorvastatin  80 mg daily - Continue Zetia  10 mg daily  Patient has not been taking many of his medications as he has lost them after being hospitalized multiple times.  Refilled most of his medications today.  Patient seen with Dr. Gilmer Lady, DO PGY-2 Internal Medicine Resident

## 2023-09-11 NOTE — Assessment & Plan Note (Addendum)
 Patient has a past medical history of HFrEF with EF of 25% found in February 2025.  Patient was on GDMT, but patient has not been adherent to his medications as he lost some of his medications after losing his hotel room.  He used to be on Entresto  97-103 mg twice daily, metoprolol  succinate 25 mg daily, spironolactone  25 mg daily, digoxin  0.125 mg daily, and Lasix  20 mg as needed.  He has not been taking any of his medications except for Farxiga  10 mg daily.  Will need to reintroduce GDMT slowly.  Blood pressure today is 126/66.  Patient looks euvolemic.  Plan: - Continue to hold spironolactone  - Hold Lasix  - Resume metoprolol  succinate 25 mg daily -Continue Farxiga  10 mg daily - Follow-up in 1 week to continue to titrate GDMT - Refill sent to pharmacy - Follow-up BMP

## 2023-09-12 ENCOUNTER — Other Ambulatory Visit: Payer: Self-pay | Admitting: Student

## 2023-09-12 ENCOUNTER — Ambulatory Visit: Payer: Self-pay | Admitting: Student

## 2023-09-12 DIAGNOSIS — Z59819 Housing instability, housed unspecified: Secondary | ICD-10-CM

## 2023-09-12 LAB — BMP8+ANION GAP
Anion Gap: 15 mmol/L (ref 10.0–18.0)
BUN/Creatinine Ratio: 20 (ref 10–24)
BUN: 29 mg/dL — ABNORMAL HIGH (ref 8–27)
CO2: 18 mmol/L — ABNORMAL LOW (ref 20–29)
Calcium: 9.6 mg/dL (ref 8.6–10.2)
Chloride: 103 mmol/L (ref 96–106)
Creatinine, Ser: 1.47 mg/dL — ABNORMAL HIGH (ref 0.76–1.27)
Glucose: 136 mg/dL — ABNORMAL HIGH (ref 70–99)
Potassium: 4.9 mmol/L (ref 3.5–5.2)
Sodium: 136 mmol/L (ref 134–144)
eGFR: 53 mL/min/{1.73_m2} — ABNORMAL LOW (ref >59)

## 2023-09-12 LAB — CBC
Hematocrit: 31.7 % — ABNORMAL LOW (ref 37.5–51.0)
Hemoglobin: 10.2 g/dL — ABNORMAL LOW (ref 13.0–17.7)
MCH: 28.3 pg (ref 26.6–33.0)
MCHC: 32.2 g/dL (ref 31.5–35.7)
MCV: 88 fL (ref 79–97)
Platelets: 423 10*3/uL (ref 150–450)
RBC: 3.6 x10E6/uL — ABNORMAL LOW (ref 4.14–5.80)
RDW: 17.2 % — ABNORMAL HIGH (ref 11.6–15.4)
WBC: 7.8 10*3/uL (ref 3.4–10.8)

## 2023-09-14 NOTE — Progress Notes (Signed)
 To Clarify Patient's osteomyelitis: Osteomyelitis of the amputation site is unrelated to the surgical procedure and occurred in the postoperative period secondary to poor wound healing

## 2023-09-18 ENCOUNTER — Encounter: Admitting: Student

## 2023-09-27 ENCOUNTER — Other Ambulatory Visit: Payer: Self-pay | Admitting: Vascular Surgery

## 2023-09-27 DIAGNOSIS — L97909 Non-pressure chronic ulcer of unspecified part of unspecified lower leg with unspecified severity: Secondary | ICD-10-CM

## 2023-09-28 NOTE — Progress Notes (Signed)
 Internal Medicine Clinic Attending  Case discussed with the resident at the time of the visit.  We reviewed the resident's history and exam and pertinent patient test results.  I agree with the assessment, diagnosis, and plan of care documented in the resident's note.

## 2023-10-02 ENCOUNTER — Encounter: Admitting: Student

## 2023-10-02 NOTE — Progress Notes (Deleted)
 CC: ***  HPI:  [name] is a 63 y.o. male living with a history stated below and presents today for ***. Please see problem based assessment and plan for additional details.  ***PMH: HTN, HFrEF, CAD, T2DM, prostate cancer,  Medications: Left ventricular thrombus: Eliquis  5 mg twice daily Hyperlipidemia: Atorvastatin  80 mg daily, Zetia  10 mg daily Diabetes: Farxiga  10 mg daily, metformin  500 mg daily Nonsustained ventricular tachycardia: Digoxin  0.125 mg daily Osteomyelitis: Doxycycline  100 mg every 12 hours HFrEF: Farxiga  10 mg daily, metoprolol  succinate 25 mg daily Iron deficiency: Ferrous sulfate  325 mg daily Neuropathy: Gabapentin  300 mg twice daily Diarrhea: Loperamide  4 mg every 6 hours as needed BPH: Flomax  0.4 mg nightly   Past Medical History:  Diagnosis Date   Anemia    Diabetes mellitus without complication (HCC) 1987   Family history of breast cancer    Hypertension    Myocardial infarction [facility])    Prostate cancer (HCC)     Current Outpatient Medications on File Prior to Visit  Medication Sig Dispense Refill   Accu-Chek Softclix Lancets lancets SMARTSIG:Topical 1-4 Times Daily 100 each 0   apixaban  (ELIQUIS ) 5 MG TABS tablet Take 1 tablet (5 mg total) by mouth 2 (two) times daily. 60 tablet 2   atorvastatin  (LIPITOR ) 80 MG tablet Take 1 tablet (80 mg total) by mouth daily. 90 tablet 3   blood glucose meter kit and supplies KIT Dispense based on patient and insurance preference. Use up to four times daily as directed. (FOR ICD-9 250.00, 250.01). 1 each 0   dapagliflozin  propanediol (FARXIGA ) 10 MG TABS tablet Take 1 tablet (10 mg total) by mouth daily before breakfast. 30 tablet 11   digoxin  (LANOXIN ) 0.125 MG tablet Take 1 tablet (0.125 mg total) by mouth daily. 90 tablet 3   ezetimibe  (ZETIA ) 10 MG tablet Take 1 tablet (10 mg total) by mouth daily. 30 tablet 11   ferrous sulfate  325 (65 FE) MG tablet Take 1 tablet (325 mg total) by mouth daily.  90 tablet 1   [Paused] furosemide  (LASIX ) 20 MG tablet Take 1 tablet (20 mg total) by mouth daily. 30 tablet 11   gabapentin  (NEURONTIN ) 300 MG capsule Take 1 capsule (300 mg total) by mouth 2 (two) times daily. 60 capsule 11   glucose blood (CONTOUR NEXT TEST) test strip Use to check blood sugar up to 3 times daily as instructed 100 each 11   loperamide  (IMODIUM ) 2 MG capsule Take 2 capsules (4 mg total) by mouth every 6 (six) hours as needed for diarrhea or loose stools.     metFORMIN  (GLUCOPHAGE ) 500 MG tablet Take 2 tablets (1,000 mg total) by mouth 2 (two) times daily with a meal. 120 tablet 11   metoprolol  succinate (TOPROL -XL) 25 MG 24 hr tablet TAKE 1 TABLET BY MOUTH DAILY 90 tablet 1   Multiple Vitamin (MULTIVITAMIN) tablet Take 1 tablet by mouth daily.     nitroGLYCERIN  (NITRODUR - DOSED IN MG/24 HR) 0.2 mg/hr patch Place 1 patch (0.2 mg total) onto the skin daily. 30 patch 12   pentoxifylline  (TRENTAL ) 400 MG CR tablet Take 1 tablet (400 mg total) by mouth 3 (three) times daily with meals. 90 tablet 3   [Paused] spironolactone  (ALDACTONE ) 25 MG tablet Take 1 tablet (25 mg total) by mouth daily. 90 tablet 3   tamsulosin  (FLOMAX ) 0.4 MG CAPS capsule Take 1 capsule (0.4 mg total) by mouth daily after supper. 90 capsule 1   No current facility-administered  medications on file prior to visit.    Family History  Problem Relation Age of Onset   Breast cancer Mother        dx 30s/40s, twice   Cancer Maternal Grandfather        unknown type, d. >50    Social History   Socioeconomic History   Marital status: Divorced    Spouse name: Not on file   Number of children: 3   Years of education: Not on file   Highest education level: Associate degree: academic program  Occupational History   Occupation: Training and development officer  Tobacco Use   Smoking status: Former    Current packs/day: 0.00    Average packs/day: 1 pack/day for 43.0 years (43.0 ttl pk-yrs)    Types: Cigarettes    Start date:  05/22/1974    Quit date: 05/22/2017    Years since quitting: 6.3   Smokeless tobacco: Never   Tobacco comments:    Quit 2019 about 1PPD  Vaping Use   Vaping status: Never Used  Substance and Sexual Activity   Alcohol use: Not Currently    Comment: last drink 2015   Drug use: Not Currently    Comment: none in 7 years   Sexual activity: Not Currently  Other Topics Concern   Not on file  Social History Narrative   3 sons   Social Drivers of Corporate investment banker Strain: Low Risk  (08/19/2022)   Overall Financial Resource Strain (CARDIA)    Difficulty of Paying Living Expenses: Not very hard  Food Insecurity: No Food Insecurity (09/02/2023)   Hunger Vital Sign    Worried About Running Out of Food in the Last Year: Never true    Ran Out of Food in the Last Year: Never true  Transportation Needs: Unmet Transportation Needs (09/02/2023)   PRAPARE - Administrator, Civil Service (Medical): Yes    Lack of Transportation (Non-Medical): No  Physical Activity: Not on file  Stress: Not on file  Social Connections: Not on file  Intimate Partner Violence: Not At Risk (09/02/2023)   Humiliation, Afraid, Rape, and Kick questionnaire    Fear of Current or Ex-Partner: No    Emotionally Abused: No    Physically Abused: No    Sexually Abused: No    Review of Systems: ROS negative except for what is noted on the assessment and plan.  There were no vitals filed for this visit.  Physical Exam  Physical Exam: Constitutional: well-appearing *** sitting in ***, in no acute distress HENT: normocephalic atraumatic, mucous membranes moist Eyes: conjunctiva non-erythematous Neck: supple Cardiovascular: regular rate and rhythm, no m/r/g Pulmonary/Chest: normal work of breathing on room air, lungs clear to auscultation bilaterally Abdominal: soft, non-tender, non-distended MSK: *** Neurological: alert & oriented x 3, 5/5 strength in bilateral upper and lower extremities, normal  gait Skin: warm and dry Psych: ***  Assessment & Plan:   No problem-specific Assessment & Plan notes found for this encounter.  Right chronic osteomyelitis -?  Orthopedic follow-up -  Question AKI HFrEF with EF of 25% in 06/2023 - On Farxiga  and metoprolol  25 daily -?  Spironolactone  and Lasix   T2DM uncontrolled with A1c 8.7 from 08/2023 -Metformin  and Farxiga  ?  GLP-1  NSVT on digoxin  ?  Cardiology follow-up  LV thrombus Question mark Eliquis  and cardiology follow-up  CHECK BMP (STILL HOLDING LASIX  AND SPIRO AND ENTRESTO )   Patient {[id number]::"discussed with","seen with"} Dr. {[id number]::"[id number]","G. Hoffman","Mullen","Narendra","Vincent","Guilloud","Lau","Machen"}  Derek Thompson, D.O. [facility] Internal  Medicine, PGY-2 Phone: [phone] Date 10/02/2023 Time 9:06 AM

## 2023-10-10 ENCOUNTER — Encounter: Admitting: Licensed Clinical Social Worker

## 2023-10-11 ENCOUNTER — Encounter: Payer: Self-pay | Admitting: Student

## 2023-10-11 ENCOUNTER — Inpatient Hospital Stay (HOSPITAL_COMMUNITY)
Admission: EM | Admit: 2023-10-11 | Discharge: 2023-10-16 | DRG: 690 | Disposition: A | Discharge status: Home / Self Care | Source: Ambulatory Visit | Attending: Infectious Diseases | Admitting: Infectious Diseases

## 2023-10-11 ENCOUNTER — Encounter (HOSPITAL_COMMUNITY): Payer: Self-pay

## 2023-10-11 ENCOUNTER — Emergency Department (HOSPITAL_COMMUNITY)

## 2023-10-11 ENCOUNTER — Ambulatory Visit: Admitting: Student

## 2023-10-11 ENCOUNTER — Ambulatory Visit (HOSPITAL_COMMUNITY)
Admission: RE | Admit: 2023-10-11 | Discharge: 2023-10-11 | Disposition: A | Discharge status: Home / Self Care | Source: Ambulatory Visit | Attending: Family Medicine | Admitting: Family Medicine

## 2023-10-11 ENCOUNTER — Other Ambulatory Visit: Payer: Self-pay

## 2023-10-11 VITALS — BP 117/56 | HR 124 | Temp 99.1°F | Ht 72.0 in | Wt 157.0 lb

## 2023-10-11 DIAGNOSIS — M86671 Other chronic osteomyelitis, right ankle and foot: Secondary | ICD-10-CM | POA: Diagnosis not present

## 2023-10-11 DIAGNOSIS — N136 Pyonephrosis: Secondary | ICD-10-CM | POA: Diagnosis not present

## 2023-10-11 DIAGNOSIS — Z79899 Other long term (current) drug therapy: Secondary | ICD-10-CM

## 2023-10-11 DIAGNOSIS — Z87891 Personal history of nicotine dependence: Secondary | ICD-10-CM

## 2023-10-11 DIAGNOSIS — E119 Type 2 diabetes mellitus without complications: Secondary | ICD-10-CM

## 2023-10-11 DIAGNOSIS — R000 Tachycardia, unspecified: Secondary | ICD-10-CM | POA: Diagnosis not present

## 2023-10-11 DIAGNOSIS — N400 Enlarged prostate without lower urinary tract symptoms: Secondary | ICD-10-CM | POA: Diagnosis present

## 2023-10-11 DIAGNOSIS — N179 Acute kidney failure, unspecified: Secondary | ICD-10-CM | POA: Diagnosis present

## 2023-10-11 DIAGNOSIS — I5022 Chronic systolic (congestive) heart failure: Secondary | ICD-10-CM | POA: Diagnosis present

## 2023-10-11 DIAGNOSIS — Z89431 Acquired absence of right foot: Secondary | ICD-10-CM

## 2023-10-11 DIAGNOSIS — E11621 Type 2 diabetes mellitus with foot ulcer: Secondary | ICD-10-CM | POA: Diagnosis present

## 2023-10-11 DIAGNOSIS — E785 Hyperlipidemia, unspecified: Secondary | ICD-10-CM | POA: Diagnosis present

## 2023-10-11 DIAGNOSIS — Z803 Family history of malignant neoplasm of breast: Secondary | ICD-10-CM

## 2023-10-11 DIAGNOSIS — Z5901 Sheltered homelessness: Secondary | ICD-10-CM

## 2023-10-11 DIAGNOSIS — E871 Hypo-osmolality and hyponatremia: Secondary | ICD-10-CM | POA: Diagnosis present

## 2023-10-11 DIAGNOSIS — D631 Anemia in chronic kidney disease: Secondary | ICD-10-CM | POA: Diagnosis present

## 2023-10-11 DIAGNOSIS — E1143 Type 2 diabetes mellitus with diabetic autonomic (poly)neuropathy: Secondary | ICD-10-CM | POA: Diagnosis present

## 2023-10-11 DIAGNOSIS — M869 Osteomyelitis, unspecified: Principal | ICD-10-CM | POA: Diagnosis present

## 2023-10-11 DIAGNOSIS — I5082 Biventricular heart failure: Secondary | ICD-10-CM | POA: Diagnosis present

## 2023-10-11 DIAGNOSIS — E1165 Type 2 diabetes mellitus with hyperglycemia: Secondary | ICD-10-CM | POA: Diagnosis present

## 2023-10-11 DIAGNOSIS — E1140 Type 2 diabetes mellitus with diabetic neuropathy, unspecified: Secondary | ICD-10-CM | POA: Diagnosis present

## 2023-10-11 DIAGNOSIS — Z7984 Long term (current) use of oral hypoglycemic drugs: Secondary | ICD-10-CM

## 2023-10-11 DIAGNOSIS — N12 Tubulo-interstitial nephritis, not specified as acute or chronic: Secondary | ICD-10-CM | POA: Diagnosis present

## 2023-10-11 DIAGNOSIS — E861 Hypovolemia: Secondary | ICD-10-CM | POA: Diagnosis present

## 2023-10-11 DIAGNOSIS — I2510 Atherosclerotic heart disease of native coronary artery without angina pectoris: Secondary | ICD-10-CM | POA: Diagnosis present

## 2023-10-11 DIAGNOSIS — N39490 Overflow incontinence: Secondary | ICD-10-CM | POA: Diagnosis present

## 2023-10-11 DIAGNOSIS — K3184 Gastroparesis: Secondary | ICD-10-CM | POA: Diagnosis present

## 2023-10-11 DIAGNOSIS — R739 Hyperglycemia, unspecified: Secondary | ICD-10-CM

## 2023-10-11 DIAGNOSIS — I70261 Atherosclerosis of native arteries of extremities with gangrene, right leg: Secondary | ICD-10-CM | POA: Diagnosis present

## 2023-10-11 DIAGNOSIS — Z5986 Financial insecurity: Secondary | ICD-10-CM

## 2023-10-11 DIAGNOSIS — E1169 Type 2 diabetes mellitus with other specified complication: Secondary | ICD-10-CM | POA: Diagnosis present

## 2023-10-11 DIAGNOSIS — N189 Chronic kidney disease, unspecified: Secondary | ICD-10-CM | POA: Diagnosis present

## 2023-10-11 DIAGNOSIS — T45516A Underdosing of anticoagulants, initial encounter: Secondary | ICD-10-CM | POA: Diagnosis present

## 2023-10-11 DIAGNOSIS — I252 Old myocardial infarction: Secondary | ICD-10-CM

## 2023-10-11 DIAGNOSIS — Y842 Radiological procedure and radiotherapy as the cause of abnormal reaction of the patient, or of later complication, without mention of misadventure at the time of the procedure: Secondary | ICD-10-CM | POA: Diagnosis present

## 2023-10-11 DIAGNOSIS — Z91120 Patient's intentional underdosing of medication regimen due to financial hardship: Secondary | ICD-10-CM

## 2023-10-11 DIAGNOSIS — L97516 Non-pressure chronic ulcer of other part of right foot with bone involvement without evidence of necrosis: Secondary | ICD-10-CM | POA: Diagnosis present

## 2023-10-11 DIAGNOSIS — Z5941 Food insecurity: Secondary | ICD-10-CM

## 2023-10-11 DIAGNOSIS — Z8673 Personal history of transient ischemic attack (TIA), and cerebral infarction without residual deficits: Secondary | ICD-10-CM

## 2023-10-11 DIAGNOSIS — Z8546 Personal history of malignant neoplasm of prostate: Secondary | ICD-10-CM

## 2023-10-11 DIAGNOSIS — E1122 Type 2 diabetes mellitus with diabetic chronic kidney disease: Secondary | ICD-10-CM | POA: Diagnosis present

## 2023-10-11 DIAGNOSIS — E860 Dehydration: Secondary | ICD-10-CM | POA: Diagnosis present

## 2023-10-11 DIAGNOSIS — E1152 Type 2 diabetes mellitus with diabetic peripheral angiopathy with gangrene: Secondary | ICD-10-CM | POA: Diagnosis present

## 2023-10-11 DIAGNOSIS — Z7901 Long term (current) use of anticoagulants: Secondary | ICD-10-CM

## 2023-10-11 DIAGNOSIS — I513 Intracardiac thrombosis, not elsewhere classified: Secondary | ICD-10-CM | POA: Diagnosis present

## 2023-10-11 DIAGNOSIS — Z923 Personal history of irradiation: Secondary | ICD-10-CM

## 2023-10-11 DIAGNOSIS — R6881 Early satiety: Secondary | ICD-10-CM | POA: Diagnosis present

## 2023-10-11 DIAGNOSIS — I240 Acute coronary thrombosis not resulting in myocardial infarction: Secondary | ICD-10-CM | POA: Diagnosis present

## 2023-10-11 DIAGNOSIS — I130 Hypertensive heart and chronic kidney disease with heart failure and stage 1 through stage 4 chronic kidney disease, or unspecified chronic kidney disease: Secondary | ICD-10-CM | POA: Diagnosis present

## 2023-10-11 DIAGNOSIS — I5023 Acute on chronic systolic (congestive) heart failure: Secondary | ICD-10-CM | POA: Diagnosis present

## 2023-10-11 LAB — BASIC METABOLIC PANEL WITH GFR
Anion gap: 9 (ref 5–15)
BUN: 33 mg/dL — ABNORMAL HIGH (ref 8–23)
CO2: 19 mmol/L — ABNORMAL LOW (ref 22–32)
Calcium: 8.9 mg/dL (ref 8.9–10.3)
Chloride: 98 mmol/L (ref 98–111)
Creatinine, Ser: 1.83 mg/dL — ABNORMAL HIGH (ref 0.61–1.24)
GFR, Estimated: 41 mL/min — ABNORMAL LOW (ref >60)
Glucose, Bld: 252 mg/dL — ABNORMAL HIGH (ref 70–99)
Potassium: 4.5 mmol/L (ref 3.5–5.1)
Sodium: 126 mmol/L — ABNORMAL LOW (ref 135–145)

## 2023-10-11 LAB — CBC
HCT: 26.9 % — ABNORMAL LOW (ref 39.0–52.0)
Hemoglobin: 8.6 g/dL — ABNORMAL LOW (ref 13.0–17.0)
MCH: 28.8 pg (ref 26.0–34.0)
MCHC: 32 g/dL (ref 30.0–36.0)
MCV: 90 fL (ref 80.0–100.0)
Platelets: 423 10*3/uL — ABNORMAL HIGH (ref 150–400)
RBC: 2.99 MIL/uL — ABNORMAL LOW (ref 4.22–5.81)
RDW: 15 % (ref 11.5–15.5)
WBC: 9.3 10*3/uL (ref 4.0–10.5)
nRBC: 0 % (ref 0.0–0.2)

## 2023-10-11 LAB — I-STAT CG4 LACTIC ACID, ED
Lactic Acid, Venous: 1 mmol/L (ref 0.5–1.9)
Lactic Acid, Venous: 1.3 mmol/L (ref 0.5–1.9)

## 2023-10-11 LAB — MAGNESIUM: Magnesium: 2 mg/dL (ref 1.7–2.4)

## 2023-10-11 LAB — D-DIMER, QUANTITATIVE: D-Dimer, Quant: 2.29 ug{FEU}/mL — ABNORMAL HIGH (ref 0.00–0.50)

## 2023-10-11 MED ORDER — ACETAMINOPHEN 500 MG PO TABS
1000.0000 mg | ORAL_TABLET | Freq: Once | ORAL | Status: AC
  Administered 2023-10-12: 1000 mg via ORAL
  Filled 2023-10-11: qty 2

## 2023-10-11 MED ORDER — LACTATED RINGERS IV BOLUS
1000.0000 mL | Freq: Once | INTRAVENOUS | Status: AC
  Administered 2023-10-12: 1000 mL via INTRAVENOUS

## 2023-10-11 MED ORDER — SODIUM CHLORIDE 0.9 % IV BOLUS
1000.0000 mL | Freq: Once | INTRAVENOUS | Status: AC
  Administered 2023-10-11: 1000 mL via INTRAVENOUS

## 2023-10-11 MED ORDER — VANCOMYCIN HCL 1500 MG/300ML IV SOLN
1500.0000 mg | Freq: Once | INTRAVENOUS | Status: AC
  Administered 2023-10-12: 1500 mg via INTRAVENOUS
  Filled 2023-10-11: qty 300

## 2023-10-11 MED ORDER — IOHEXOL 350 MG/ML SOLN
75.0000 mL | Freq: Once | INTRAVENOUS | Status: AC | PRN
  Administered 2023-10-11: 75 mL via INTRAVENOUS

## 2023-10-11 NOTE — ED Triage Notes (Signed)
 Pt was at PCP and experiencing tachycardia. Denies CP and SHOB.

## 2023-10-11 NOTE — ED Provider Triage Note (Signed)
 Emergency Medicine Provider Triage Evaluation Note  Derek Thompson , a 63 y.o. male  was evaluated in triage.  Pt complains of tachycardia.  Sent by PCP.  Heart rate was noted to be elevated.  Denies chest pain, shortness of breath and palpitations.  Does have history of NSVT, And CAD.  He reports that he is homeless at this time and lives at a shelter.  States there is a lot of time between meals and not sure if he is staying hydrated either.  Review of Systems  Positive: See above Negative: See above  Physical Exam  BP 118/69 (BP Location: Left Arm)   Pulse (!) 125   Temp 99 F (37.2 C)   Resp 20   Ht 6' (1.829 m)   Wt 71.2 kg   SpO2 100%   BMI 21.29 kg/m  Gen:   Awake, no distress  Resp:  Normal effort  MSK:   Moves extremities without difficulty  Other:    Medical Decision Making  Medically screening exam initiated at 3:20 PM.  Appropriate orders placed.  Degan Trygve Thal was informed that the remainder of the evaluation will be completed by another provider, this initial triage assessment does not replace that evaluation, and the importance of remaining in the ED until their evaluation is complete.  Work up started   Janalee Mcmurray, PA-C 10/11/23 2059

## 2023-10-11 NOTE — Assessment & Plan Note (Addendum)
 See above. Gave oral fluids without improvement. EKG with sinus tachycardia and previously seen [facility]. Of note, patient reports has not been taking Eliquis  for hx of LV thrombus due to financial constraints, unclear on dispense records. Will need further evaluation in ED.

## 2023-10-11 NOTE — Patient Instructions (Addendum)
 Thank you, [name] for allowing us  to provide your care today. Today we discussed   Please go to the [facility] Emergency Room.  I am concerned about your right foot infection since you were not able to follow up with orthopedics and completed antibiotics.  Your heart rate remains high today despite fluids.  You will need labs to check blood counts, kidney function and concerns for acute on your chronic foot infection.   [facility]  [address] [place], [name] [address] [phone]  [facility]  [address] [place], [name] [address] [phone]    Follow up: Reschedule appt after Emergency Room evaluation

## 2023-10-11 NOTE — ED Provider Notes (Signed)
 Derek Thompson EMERGENCY DEPARTMENT AT [facility] Provider Note   CSN: [id number] Arrival date & time: 10/11/23  1449     History  Chief Complaint  Patient presents with   Tachycardia    Derek Thompson is a 63 y.o. male.  Patient is a 63 year old male who presents with tachycardia.  He has a history of hypertension, diabetes, recent osteomyelitis of the right foot and LV thrombus who presents with increased heart rates and foot swelling.  He was recently admitted about a month ago for osteomyelitis of the right foot.  He declined BKA.  He completed a course of doxycycline .  It was also recently discovered on echo that he had an LV thrombus and was started on Eliquis  although it sounds like currently he is not taking that.  He is currently homeless and is living in a shelter.  He went to PCP follow-up to recheck the osteomyelitis and there was some concern for increased redness/infection to the area.  Since he was tachycardic, he was sent to the ED for further evaluation.  He denies any known fevers.  No chest pain or shortness of breath.  No vomiting or diarrhea.  No abdominal pain.  He says the foot looks pretty similar to how it has.       Home Medications Prior to Admission medications   Medication Sig Start Date End Date Taking? Authorizing Provider  Accu-Chek Softclix Lancets lancets SMARTSIG:Topical 1-4 Times Daily 08/20/22   Atway, Rayann N, DO  apixaban  (ELIQUIS ) 5 MG TABS tablet Take 1 tablet (5 mg total) by mouth 2 (two) times daily. 09/11/23 12/10/23  Jonelle Neri, DO  atorvastatin  (LIPITOR ) 80 MG tablet Take 1 tablet (80 mg total) by mouth daily. 09/11/23 12/10/23  Jonelle Neri, DO  blood glucose meter kit and supplies KIT Dispense based on patient and insurance preference. Use up to four times daily as directed. (FOR ICD-9 250.00, 250.01). 08/20/22   Atway, Rayann N, DO  dapagliflozin  propanediol (FARXIGA ) 10 MG TABS tablet Take 1 tablet (10 mg total) by mouth daily  before breakfast. 09/11/23   Jonelle Neri, DO  digoxin  (LANOXIN ) 0.125 MG tablet Take 1 tablet (0.125 mg total) by mouth daily. 09/07/22   Ruddy Corral M, PA-C  ezetimibe  (ZETIA ) 10 MG tablet Take 1 tablet (10 mg total) by mouth daily. 09/11/23   Jonelle Neri, DO  ferrous sulfate  325 (65 FE) MG tablet Take 1 tablet (325 mg total) by mouth daily. 09/11/23   Jonelle Neri, DO  furosemide  (LASIX ) 20 MG tablet Take 1 tablet (20 mg total) by mouth daily. 10/05/22 10/05/23  Bensimhon, Rheta Celestine, MD  gabapentin  (NEURONTIN ) 300 MG capsule Take 1 capsule (300 mg total) by mouth 2 (two) times daily. 09/11/23   Jonelle Neri, DO  glucose blood (CONTOUR NEXT TEST) test strip Use to check blood sugar up to 3 times daily as instructed 11/22/22   Zheng, Michael, DO  loperamide  (IMODIUM ) 2 MG capsule Take 2 capsules (4 mg total) by mouth every 6 (six) hours as needed for diarrhea or loose stools. 06/29/23   Singh, Prashant K, MD  metFORMIN  (GLUCOPHAGE ) 500 MG tablet Take 2 tablets (1,000 mg total) by mouth 2 (two) times daily with a meal. 09/11/23 10/11/23  Jonelle Neri, DO  metoprolol  succinate (TOPROL -XL) 25 MG 24 hr tablet TAKE 1 TABLET BY MOUTH DAILY 09/11/23   Jonelle Neri, DO  Multiple Vitamin (MULTIVITAMIN) tablet Take 1 tablet by mouth daily.    [provider]  nitroGLYCERIN  (NITRODUR - DOSED IN MG/24 HR) 0.2 mg/hr patch Place 1 patch (0.2 mg total) onto the skin daily. 08/25/23   Zamora, Erin R, NP  pentoxifylline  (TRENTAL ) 400 MG CR tablet Take 1 tablet (400 mg total) by mouth 3 (three) times daily with meals. 08/25/23   Zamora, Erin R, NP  spironolactone  (ALDACTONE ) 25 MG tablet Take 1 tablet (25 mg total) by mouth daily. 10/05/22   Bensimhon, Rheta Celestine, MD  tamsulosin  (FLOMAX ) 0.4 MG CAPS capsule Take 1 capsule (0.4 mg total) by mouth daily after supper. 09/11/23   Patel, Amar, DO      Allergies    Patient has no known allergies.    Review of Systems   Review of Systems  Constitutional:  Positive for fatigue.  Negative for chills, diaphoresis and fever.  HENT:  Negative for congestion, rhinorrhea and sneezing.   Eyes: Negative.   Respiratory:  Negative for cough, chest tightness and shortness of breath.   Cardiovascular:  Positive for palpitations and leg swelling. Negative for chest pain.  Gastrointestinal:  Negative for abdominal pain, blood in stool, diarrhea, nausea and vomiting.  Genitourinary:  Negative for difficulty urinating, flank pain, frequency and hematuria.  Musculoskeletal:  Positive for arthralgias. Negative for back pain.  Skin:  Negative for rash.  Neurological:  Negative for dizziness, speech difficulty, weakness, numbness and headaches.    Physical Exam Updated Vital Signs BP 108/61   Pulse (!) 114   Temp 99.7 F (37.6 C) (Rectal)   Resp 18   Ht 6' (1.829 m)   Wt 71.2 kg   SpO2 100%   BMI 21.29 kg/m  Physical Exam Constitutional:      Appearance: He is well-developed.  HENT:     Head: Normocephalic and atraumatic.  Eyes:     Pupils: Pupils are equal, round, and reactive to light.  Cardiovascular:     Rate and Rhythm: Normal rate and regular rhythm.     Heart sounds: Normal heart sounds.  Pulmonary:     Effort: Pulmonary effort is normal. No respiratory distress.     Breath sounds: Normal breath sounds. No wheezing or rales.  Chest:     Chest wall: No tenderness.  Abdominal:     General: Bowel sounds are normal.     Palpations: Abdomen is soft.     Tenderness: There is no abdominal tenderness. There is no guarding or rebound.  Musculoskeletal:        General: Normal range of motion.     Cervical back: Normal range of motion and neck supple.     Comments: Some swelling of the right leg as compared to the left.  There are some wounds to the medial aspect of the right foot.  There is a large eschar on 1 and some exudative material on the other 1.  There are some mild erythema to the foot.  Lymphadenopathy:     Cervical: No cervical adenopathy.  Skin:     General: Skin is warm and dry.     Findings: No rash.  Neurological:     Mental Status: He is alert and oriented to person, place, and time.     ED Results / Procedures / Treatments   Labs (all labs ordered are listed, but only abnormal results are displayed) Labs Reviewed  BASIC METABOLIC PANEL WITH GFR - Abnormal; Notable for the following components:      Result Value   Sodium 126 (*)    CO2 19 (*)  Glucose, Bld 252 (*)    BUN 33 (*)    Creatinine, Ser 1.83 (*)    GFR, Estimated 41 (*)    All other components within normal limits  CBC - Abnormal; Notable for the following components:   RBC 2.99 (*)    Hemoglobin 8.6 (*)    HCT 26.9 (*)    Platelets 423 (*)    All other components within normal limits  D-DIMER, QUANTITATIVE - Abnormal; Notable for the following components:   D-Dimer, Quant 2.29 (*)    All other components within normal limits  CULTURE, BLOOD (ROUTINE X 2)  CULTURE, BLOOD (ROUTINE X 2)  MAGNESIUM   I-STAT CG4 LACTIC ACID, ED  I-STAT CG4 LACTIC ACID, ED    EKG EKG Interpretation Date/Time:  Wednesday October 11 2023 14:58:32 EDT Ventricular Rate:  121 PR Interval:  142 QRS Duration:  126 QT Interval:  350 QTC Calculation: 497 R Axis:   114  Text Interpretation: Sinus tachycardia Right axis deviation Non-specific intra-ventricular conduction block T wave abnormality, consider inferolateral ischemia Abnormal ECG When compared with ECG of 11-Oct-2023 13:56, PREVIOUS ECG IS PRESENT Confirmed by Hershel Los [phone]) on 10/11/2023 8:13:11 PM  Radiology No results found.  Procedures Procedures    Medications Ordered in ED Medications  lactated ringers  bolus 1,000 mL (has no administration in time range)  acetaminophen  (TYLENOL ) tablet 1,000 mg (has no administration in time range)  iohexol  (OMNIPAQUE ) 350 MG/ML injection 75 mL (has no administration in time range)  sodium chloride  0.9 % bolus 1,000 mL (1,000 mLs Intravenous New Bag/Given 10/11/23  2240)    ED Course/ Medical Decision Making/ A&P                                 Medical Decision Making Amount and/or Complexity of Data Reviewed Labs: ordered. Radiology: ordered.  Risk OTC drugs. Prescription drug management. Decision regarding hospitalization.   This patient presents to the ED for concern of foot pain, tachycardia, this involves an extensive number of treatment options, and is a complaint that carries with it a high risk of complications and morbidity.  I considered the following differential and admission for this acute, potentially life threatening condition.  The differential diagnosis includes osteomyelitis, abscess, sepsis, PE, dehydration, DKA  MDM:    Patient is a 63 year old who is diabetic and also homeless who presents with some increased redness and swelling to his right foot as well as tachycardia.  His foot does not look super red or inflamed but apparently per his PCP notes, it is worse than it has been.  He has noted to be tachycardic with heart rates in the 120s.  He has a normal white count but does have an elevated glucose.  He has some associated hyponatremia, part of which may be related to pseudohyponatremia.  No evidence of DKA.  His lactate is normal.  His heart rate is improving with fluids but still around 116.  He does have a prior history of LV thrombus but currently is not taking Eliquis .  He feels fatigued but not specifically short of breath.  Given the ongoing tachycardia, D-dimer was checked which was elevated.  CT scan pending.  No hypoxia or chest pain.  His temperature did get up to around 100 here in the ED which may be contributing partly to the tachycardia.  Will plan admission.  Will consult with the IM resident.  (Labs, imaging, consults)  Labs: I Ordered, and personally interpreted labs.  The pertinent results include: Anemia, has been similar to prior values.  Is been up and down in the past.  Lactate is normal, white count is  normal.  Does have hyponatremia and hyperglycemia.  No anion gap or acidosis.  Imaging Studies ordered: I ordered imaging studies including CT chest I independently visualized and interpreted imaging. I agree with the radiologist interpretation  Additional history obtained from chart.  External records from outside source obtained and reviewed including prior notes  Cardiac Monitoring: The patient was maintained on a cardiac monitor.  If on the cardiac monitor, I personally viewed and interpreted the cardiac monitored which showed an underlying rhythm of: Sinus tachycardia  Reevaluation: After the interventions noted above, I reevaluated the patient and found that they have :improved  Social Determinants of Health:  homeless  Disposition: Admitted to hospital  Co morbidities that complicate the patient evaluation  Past Medical History:  Diagnosis Date   Anemia    Diabetes mellitus without complication (HCC) 1987   Family history of breast cancer    Hypertension    Myocardial infarction (HCC)    Prostate cancer (HCC)      Medicines Meds ordered this encounter  Medications   sodium chloride  0.9 % bolus 1,000 mL   lactated ringers  bolus 1,000 mL   acetaminophen  (TYLENOL ) tablet 1,000 mg   iohexol  (OMNIPAQUE ) 350 MG/ML injection 75 mL    I have reviewed the patients home medicines and have made adjustments as needed  Problem List / ED Course: Problem List Items Addressed This Visit       Other   Tachycardia   Other Visit Diagnoses       Osteomyelitis of right foot, unspecified type (HCC)    -  Primary     Hyperglycemia                 Final Clinical Impression(s) / ED Diagnoses Final diagnoses:  Osteomyelitis of right foot, unspecified type (HCC)  Tachycardia  Hyperglycemia    Rx / DC Orders ED Discharge Orders     None         Hershel Los, MD 10/11/23 2334

## 2023-10-11 NOTE — Progress Notes (Addendum)
 CC: follow up visit  HPI:  [name] is a 63 y.o. male living with a history stated below and presents today for follow up visit. Please see problem based assessment and plan for additional details.  Medications: Left ventricular thrombus: Eliquis  5 mg twice daily (NOT TAKING) Hyperlipidemia: Atorvastatin  80 mg daily, Zetia  10 mg daily Diabetes: Farxiga  10 mg daily, metformin  500 mg daily Nonsustained ventricular tachycardia: Digoxin  0.125 mg daily Osteomyelitis: Doxycycline  100 mg every 12 hours (NOT TAKING) HFrEF: Farxiga  10 mg daily, metoprolol  succinate 25 mg daily Iron deficiency: Ferrous sulfate  325 mg daily Neuropathy: Gabapentin  300 mg twice daily (NOT TAKING) Diarrhea: Loperamide  4 mg every 6 hours as needed  BPH: Flomax  0.4 mg nightly   Past Medical History:  Diagnosis Date   Anemia    Diabetes mellitus without complication (HCC) 1987   Family history of breast cancer    Hypertension    Myocardial infarction [facility])    Prostate cancer (HCC)     Current Outpatient Medications on File Prior to Visit  Medication Sig Dispense Refill   Accu-Chek Softclix Lancets lancets SMARTSIG:Topical 1-4 Times Daily 100 each 0   apixaban  (ELIQUIS ) 5 MG TABS tablet Take 1 tablet (5 mg total) by mouth 2 (two) times daily. 60 tablet 2   atorvastatin  (LIPITOR ) 80 MG tablet Take 1 tablet (80 mg total) by mouth daily. 90 tablet 3   blood glucose meter kit and supplies KIT Dispense based on patient and insurance preference. Use up to four times daily as directed. (FOR ICD-9 250.00, 250.01). 1 each 0   dapagliflozin  propanediol (FARXIGA ) 10 MG TABS tablet Take 1 tablet (10 mg total) by mouth daily before breakfast. 30 tablet 11   digoxin  (LANOXIN ) 0.125 MG tablet Take 1 tablet (0.125 mg total) by mouth daily. 90 tablet 3   ezetimibe  (ZETIA ) 10 MG tablet Take 1 tablet (10 mg total) by mouth daily. 30 tablet 11   ferrous sulfate  325 (65 FE) MG tablet Take 1 tablet (325 mg total) by  mouth daily. 90 tablet 1   [Paused] furosemide  (LASIX ) 20 MG tablet Take 1 tablet (20 mg total) by mouth daily. 30 tablet 11   gabapentin  (NEURONTIN ) 300 MG capsule Take 1 capsule (300 mg total) by mouth 2 (two) times daily. 60 capsule 11   glucose blood (CONTOUR NEXT TEST) test strip Use to check blood sugar up to 3 times daily as instructed 100 each 11   loperamide  (IMODIUM ) 2 MG capsule Take 2 capsules (4 mg total) by mouth every 6 (six) hours as needed for diarrhea or loose stools.     metFORMIN  (GLUCOPHAGE ) 500 MG tablet Take 2 tablets (1,000 mg total) by mouth 2 (two) times daily with a meal. 120 tablet 11   metoprolol  succinate (TOPROL -XL) 25 MG 24 hr tablet TAKE 1 TABLET BY MOUTH DAILY 90 tablet 1   Multiple Vitamin (MULTIVITAMIN) tablet Take 1 tablet by mouth daily.     nitroGLYCERIN  (NITRODUR - DOSED IN MG/24 HR) 0.2 mg/hr patch Place 1 patch (0.2 mg total) onto the skin daily. 30 patch 12   pentoxifylline  (TRENTAL ) 400 MG CR tablet Take 1 tablet (400 mg total) by mouth 3 (three) times daily with meals. 90 tablet 3   [Paused] spironolactone  (ALDACTONE ) 25 MG tablet Take 1 tablet (25 mg total) by mouth daily. 90 tablet 3   tamsulosin  (FLOMAX ) 0.4 MG CAPS capsule Take 1 capsule (0.4 mg total) by mouth daily after supper. 90 capsule 1  No current facility-administered medications on file prior to visit.    Family History  Problem Relation Age of Onset   Breast cancer Mother        dx 30s/40s, twice   Cancer Maternal Grandfather        unknown type, d. >50    Social History   Socioeconomic History   Marital status: Divorced    Spouse name: Not on file   Number of children: 3   Years of education: Not on file   Highest education level: Associate degree: academic program  Occupational History   Occupation: Training and development officer  Tobacco Use   Smoking status: Former    Current packs/day: 0.00    Average packs/day: 1 pack/day for 43.0 years (43.0 ttl pk-yrs)    Types: Cigarettes    Start  date: 05/22/1974    Quit date: 05/22/2017    Years since quitting: 6.3   Smokeless tobacco: Never   Tobacco comments:    Quit 2019 about 1PPD  Vaping Use   Vaping status: Never Used  Substance and Sexual Activity   Alcohol use: Not Currently    Comment: last drink 2015   Drug use: Not Currently    Comment: none in 7 years   Sexual activity: Not Currently  Other Topics Concern   Not on file  Social History Narrative   3 sons   Social Drivers of Corporate investment banker Strain: Low Risk  (08/19/2022)   Overall Financial Resource Strain (CARDIA)    Difficulty of Paying Living Expenses: Not very hard  Food Insecurity: No Food Insecurity (09/02/2023)   Hunger Vital Sign    Worried About Running Out of Food in the Last Year: Never true    Ran Out of Food in the Last Year: Never true  Transportation Needs: Unmet Transportation Needs (09/02/2023)   PRAPARE - Administrator, Civil Service (Medical): Yes    Lack of Transportation (Non-Medical): No  Physical Activity: Not on file  Stress: Not on file  Social Connections: Not on file  Intimate Partner Violence: Not At Risk (09/02/2023)   Humiliation, Afraid, Rape, and Kick questionnaire    Fear of Current or Ex-Partner: No    Emotionally Abused: No    Physically Abused: No    Sexually Abused: No    Review of Systems: ROS negative except for what is noted on the assessment and plan.  Vitals:   10/11/23 1320 10/11/23 1324 10/11/23 1351  BP: (!) 114/52 (!) 116/59 (!) 117/56  Pulse: (!) 126 (!) 126 (!) 124  Temp: 99.1 F (37.3 C)    TempSrc: Oral    SpO2: 100%    Weight: 157 lb (71.2 kg)    Height: 6' (1.829 m)     Physical Exam: Constitutional: alert, sitting in chair, appears unwell, in no acute distress Cardiovascular: tachycardia Pulmonary/Chest: normal work of breathing on room air, lungs clear to auscultation bilaterally Neurological: alert & oriented x 3 Skin: see photo below for R foot, eschar of right  midfoot and lateral seen on prior visit but increased erythema and swelling, no bleeding or drainage noted, 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 Has not followed up with orthopedics. Did finish doxycycline  for 2 weeks a few weeks ago. Prior hospitalization, discussion of BKA by Dr. Julio Ohm but patient declined. 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 Denies fever or chills. No chest pain or SOB. Exam worrisome for infection of right foot with increased erythema and swelling. No drainage. He remains quite tachycardic 126 despite oral fluids. Denies palpitations. He was unhomed but has shelter now. I am concerned for outpatient f/u since he has not followed up with orthopedics since hospital d/c due to transportation. Of note, has AKI that was to follow up today with repeat labs but given concern for infection, patient agreed for further evaluation at ED. He is stable to transport by [employer]OV Building surveyor). Gave report to Charge Nurse at [facility] ED.  -CBC, BMP, ESR, CRP, further imaging  -Patient has declined BKA in past but aware of concern for worsening infection with uncontrolled T2DM  Tachycardia See above. Gave oral fluids without improvement. EKG with sinus tachycardia and previously seen [facility]. Of note, patient reports has not been taking Eliquis  for hx of LV thrombus due to financial constraints, unclear on dispense records. Will need further evaluation in ED.   AKI (acute kidney injury) (HCC) Follow up visit for AKI since hospitalization. Had slight improvement at last OV, Scr 1.47 (BL ~1.1). Currently lives in shelter and at times may only eat 1 meal. He remains tachycardic without improvement with oral fluids. Sending to ED for c/f foot infection and possible worsening of AKI. See above problem for more details.    Patient discussed with Dr. Ofilia Benton, D.O. [facility]  Internal Medicine, PGY-2 Phone: [phone] Date 10/11/2023 Time 3:19 PM

## 2023-10-11 NOTE — Progress Notes (Signed)
 ED Pharmacy Antibiotic Sign Off An antibiotic consult was received from an ED provider for vancomycin  per pharmacy dosing for osteomyelitis. A chart review was completed to assess appropriateness.   The following one time order(s) were placed:  Vancomycin  1500mg  IV once   Further antibiotic and/or antibiotic pharmacy consults should be ordered by the admitting provider if indicated.   Thank you for allowing pharmacy to be a part of this patient's care.   Harvest Lineman, [facility]  Clinical Pharmacist 10/11/23 11:20 PM

## 2023-10-11 NOTE — Assessment & Plan Note (Addendum)
 Presents for f/u of chronic osteomyelitis of right foot. Has not followed up with orthopedics. Did finish doxycycline  for 2 weeks a few weeks ago. Prior hospitalization, discussion of BKA by Dr. Julio Ohm but patient declined. 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 Denies fever or chills. No chest pain or SOB. Exam worrisome for infection of right foot with increased erythema and swelling. No drainage. He remains quite tachycardic 126 despite oral fluids. Denies palpitations. He was unhomed but has shelter now. I am concerned for outpatient f/u since he has not followed up with orthopedics since hospital d/c due to transportation. Of note, has AKI that was to follow up today with repeat labs but given concern for infection, patient agreed for further evaluation at ED. He is stable to transport by [employer] Building surveyor). Gave report to Charge Nurse at [facility] ED.  -CBC, BMP, ESR, CRP, further imaging  -Patient has declined BKA in past but aware of concern for worsening infection with uncontrolled T2DM

## 2023-10-11 NOTE — ED Notes (Signed)
 Pt provided urinal at bedside, pt informed urine sample needed.

## 2023-10-11 NOTE — Assessment & Plan Note (Signed)
 Follow up visit for AKI since hospitalization. Had slight improvement at last OV, Scr 1.47 (BL ~1.1). Currently lives in shelter and at times may only eat 1 meal. He remains tachycardic without improvement with oral fluids. Sending to ED for c/f foot infection and possible worsening of AKI. See above problem for more details.

## 2023-10-11 NOTE — ED Notes (Signed)
 Patient transported to CT

## 2023-10-12 ENCOUNTER — Telehealth: Payer: Self-pay | Admitting: *Deleted

## 2023-10-12 ENCOUNTER — Observation Stay (HOSPITAL_COMMUNITY)

## 2023-10-12 DIAGNOSIS — I5022 Chronic systolic (congestive) heart failure: Secondary | ICD-10-CM

## 2023-10-12 DIAGNOSIS — E871 Hypo-osmolality and hyponatremia: Secondary | ICD-10-CM | POA: Insufficient documentation

## 2023-10-12 DIAGNOSIS — M86171 Other acute osteomyelitis, right ankle and foot: Secondary | ICD-10-CM | POA: Diagnosis not present

## 2023-10-12 DIAGNOSIS — E1169 Type 2 diabetes mellitus with other specified complication: Secondary | ICD-10-CM | POA: Diagnosis not present

## 2023-10-12 DIAGNOSIS — Z7984 Long term (current) use of oral hypoglycemic drugs: Secondary | ICD-10-CM

## 2023-10-12 DIAGNOSIS — R000 Tachycardia, unspecified: Secondary | ICD-10-CM

## 2023-10-12 DIAGNOSIS — E1151 Type 2 diabetes mellitus with diabetic peripheral angiopathy without gangrene: Secondary | ICD-10-CM

## 2023-10-12 DIAGNOSIS — R739 Hyperglycemia, unspecified: Secondary | ICD-10-CM

## 2023-10-12 LAB — BASIC METABOLIC PANEL WITH GFR
Anion gap: 7 (ref 5–15)
Anion gap: 9 (ref 5–15)
BUN: 29 mg/dL — ABNORMAL HIGH (ref 8–23)
BUN: 25 mg/dL — ABNORMAL HIGH (ref 8–23)
CO2: 19 mmol/L — ABNORMAL LOW (ref 22–32)
CO2: 20 mmol/L — ABNORMAL LOW (ref 22–32)
Calcium: 8.1 mg/dL — ABNORMAL LOW (ref 8.9–10.3)
Calcium: 8.6 mg/dL — ABNORMAL LOW (ref 8.9–10.3)
Chloride: 103 mmol/L (ref 98–111)
Chloride: 106 mmol/L (ref 98–111)
Creatinine, Ser: 1.66 mg/dL — ABNORMAL HIGH (ref 0.61–1.24)
Creatinine, Ser: 1.6 mg/dL — ABNORMAL HIGH (ref 0.61–1.24)
GFR, Estimated: 46 mL/min — ABNORMAL LOW (ref >60)
GFR, Estimated: 48 mL/min — ABNORMAL LOW (ref >60)
Glucose, Bld: 177 mg/dL — ABNORMAL HIGH (ref 70–99)
Glucose, Bld: 94 mg/dL (ref 70–99)
Potassium: 4.2 mmol/L (ref 3.5–5.1)
Potassium: 4.4 mmol/L (ref 3.5–5.1)
Sodium: 129 mmol/L — ABNORMAL LOW (ref 135–145)
Sodium: 135 mmol/L (ref 135–145)

## 2023-10-12 LAB — URINALYSIS, W/ REFLEX TO CULTURE (INFECTION SUSPECTED)
Bilirubin Urine: NEGATIVE
Glucose, UA: >=500 mg/dL — AB
Ketones, ur: NEGATIVE mg/dL
Nitrite: NEGATIVE
Protein, ur: 100 mg/dL — AB
Specific Gravity, Urine: 1.007 (ref 1.005–1.030)
WBC, UA: >50 WBC/hpf (ref 0–5)
pH: 6 (ref 5.0–8.0)

## 2023-10-12 LAB — ECHOCARDIOGRAM COMPLETE
Calc EF: 22.6 %
Height: 72 in
MV M vel: 3.69 m/s
MV Peak grad: 54.5 mmHg
Radius: 0.3 cm
S' Lateral: 4.8 cm
Single Plane A2C EF: 23.7 %
Single Plane A4C EF: 18.8 %
Weight: 2512 [oz_av]

## 2023-10-12 LAB — GLUCOSE, CAPILLARY
Glucose-Capillary: 201 mg/dL — ABNORMAL HIGH (ref 70–99)
Glucose-Capillary: 83 mg/dL (ref 70–99)
Glucose-Capillary: 121 mg/dL — ABNORMAL HIGH (ref 70–99)

## 2023-10-12 LAB — SEDIMENTATION RATE: Sed Rate: 121 mm/h — ABNORMAL HIGH (ref 0–16)

## 2023-10-12 LAB — CBG MONITORING, ED: Glucose-Capillary: 163 mg/dL — ABNORMAL HIGH (ref 70–99)

## 2023-10-12 LAB — SODIUM, URINE, RANDOM: Sodium, Ur: 41 mmol/L

## 2023-10-12 LAB — OSMOLALITY: Osmolality: 306 mosm/kg — ABNORMAL HIGH (ref 275–295)

## 2023-10-12 LAB — OSMOLALITY, URINE: Osmolality, Ur: 193 mosm/kg — ABNORMAL LOW (ref 300–900)

## 2023-10-12 LAB — C-REACTIVE PROTEIN: CRP: 12.7 mg/dL — ABNORMAL HIGH (ref <1.0)

## 2023-10-12 MED ORDER — TAMSULOSIN HCL 0.4 MG PO CAPS
0.4000 mg | ORAL_CAPSULE | Freq: Every day | ORAL | Status: DC
  Administered 2023-10-12 – 2023-10-15 (×3): 0.4 mg via ORAL
  Filled 2023-10-12 (×4): qty 1

## 2023-10-12 MED ORDER — METOCLOPRAMIDE HCL 5 MG PO TABS: 5.0000 mg | ORAL_TABLET | Freq: Four times a day (QID) | ORAL | Status: DC | PRN

## 2023-10-12 MED ORDER — ATORVASTATIN CALCIUM 80 MG PO TABS
80.0000 mg | ORAL_TABLET | Freq: Every day | ORAL | Status: DC
  Administered 2023-10-12 – 2023-10-16 (×3): 80 mg via ORAL
  Filled 2023-10-12 (×5): qty 1

## 2023-10-12 MED ORDER — INSULIN ASPART 100 UNIT/ML IJ SOLN
0.0000 [IU] | Freq: Three times a day (TID) | INTRAMUSCULAR | Status: DC
  Administered 2023-10-12: 5 [IU] via SUBCUTANEOUS
  Administered 2023-10-15 – 2023-10-16 (×5): 3 [IU] via SUBCUTANEOUS

## 2023-10-12 MED ORDER — METOPROLOL SUCCINATE ER 25 MG PO TB24
25.0000 mg | ORAL_TABLET | Freq: Every day | ORAL | Status: DC
  Administered 2023-10-12 – 2023-10-16 (×3): 25 mg via ORAL
  Filled 2023-10-12 (×5): qty 1

## 2023-10-12 MED ORDER — PERFLUTREN LIPID MICROSPHERE
1.0000 mL | INTRAVENOUS | Status: AC | PRN
  Administered 2023-10-12: 5 mL via INTRAVENOUS

## 2023-10-12 MED ORDER — EZETIMIBE 10 MG PO TABS
10.0000 mg | ORAL_TABLET | Freq: Every day | ORAL | Status: DC
  Administered 2023-10-12 – 2023-10-16 (×3): 10 mg via ORAL
  Filled 2023-10-12 (×5): qty 1

## 2023-10-12 MED ORDER — APIXABAN 5 MG PO TABS
5.0000 mg | ORAL_TABLET | Freq: Two times a day (BID) | ORAL | Status: DC
  Administered 2023-10-12 (×2): 5 mg via ORAL
  Filled 2023-10-12 (×3): qty 1

## 2023-10-12 MED ORDER — SODIUM CHLORIDE 0.9 % IV SOLN
1.0000 g | INTRAVENOUS | Status: DC
  Administered 2023-10-12: 1 g via INTRAVENOUS
  Filled 2023-10-12 (×3): qty 10

## 2023-10-12 MED ORDER — CHLORHEXIDINE GLUCONATE CLOTH 2 % EX PADS
6.0000 | MEDICATED_PAD | Freq: Every day | CUTANEOUS | Status: DC
  Administered 2023-10-12 – 2023-10-16 (×3): 6 via TOPICAL

## 2023-10-12 MED ORDER — DIGOXIN 125 MCG PO TABS
0.1250 mg | ORAL_TABLET | Freq: Every day | ORAL | Status: DC
  Administered 2023-10-12 – 2023-10-16 (×3): 0.125 mg via ORAL
  Filled 2023-10-12 (×6): qty 1

## 2023-10-12 NOTE — TOC Transition Note (Addendum)
 Transition of Care [facility]) - Discharge Note   Patient Details  Name: Derek Thompson MRN: [id number] Date of Birth: [birth year]  Transition of Care [facility] CM/SW Contact:  Cindia Crease, RN Phone Number: 10/12/2023, 3:14 PM   Clinical Narrative: Pt admitted with tachycardia. Hx of osteomyelitis of the R foot and was recently admitted for the same earlier in May. At that time BKA was suggested, but he decided to preserve his food and received two weeks of doxycycline  instead. He was to have close f/u with ortho, but he was unable to due to transportation issues.   Met with pt to discuss the DC plan. Pt reports that he has been living at [facility]. He plans to return to [facility] when he is DC. Pt reports that he walks with a cane if prn. He has a wheelchair, but he doesn't use it. He has a car, but he is unable to drive due to issues with his vision. He reports that his friends provide transportation to his doctors appointments. He reports that he may need assistance with transportation at time of DC. He has a PCP (Dr. Jari Merles). He reports that he has medical insurance and he is on short term disability. He reports that sometimes he is not able to afford his meds because they are too expensive. Discussed with pt [employer] to get coupons for meds. Encouraged pt to discuss his meds with the pharmacist regarding discount programs/coupons. Will continue to f/u to assist with the DC plan.    Final next level of care: Homeless Shelter Barriers to Discharge: Continued Medical Work up, Homeless with medical needs   Patient Goals and CMS Choice Patient states their goals for this hospitalization and ongoing recovery are:: get better          Discharge Placement                       Discharge Plan and Services Additional resources added to the After Visit Summary for     Discharge Planning Services: CM Consult                                 Social  Drivers of Health (SDOH) Interventions SDOH Screenings   Food Insecurity: No Food Insecurity (10/12/2023)  Housing: High Risk (10/12/2023)  Transportation Needs: Unmet Transportation Needs (10/12/2023)  Utilities: Not At Risk (10/12/2023)  Alcohol Screen: Low Risk  (08/19/2022)  Depression (PHQ2-9): Low Risk  (10/11/2023)  Financial Resource Strain: Low Risk  (08/19/2022)  Tobacco Use: Medium Risk (10/11/2023)     Readmission Risk Interventions     No data to display

## 2023-10-12 NOTE — Hospital Course (Addendum)
 Pyelonephritis Cystitis Urinary overflow incontinence BPH Hx of prostate cancer s/p radiation CT and US  imaging consistent with pyelonephritis and cystitis, which are likely secondary to urinary retention from overflow incontinence s/p prostate radiation. He remained afebrile on rocephin  and was transitioned to Augmentin  with a planned 7 day course.   Sinus Tachycardia Patient's tachycardia 100-115 compared with 95-100 yesterday and 120-130 on admission. He has been on metoprolol  25 mg daily. Tachycardia is likely multifactorial. Pyelonephritis and cystitis likely large contributors. HFrEF LVE 24% also likely contributing with low stroke volume and compensatory tachycardia. Anemia also likely contributor. Less likely due to hypovolemia with evidence of volume overload on exam and POCUS.   Normocytic anemia Hemoglobin 8.2 on the day of discharge. Patient still denied chest pain, SOB, and overt bleeding. B12 and folate within normal limits. Reticulocytes 1.3 with RPI 0.4, indicating inadequate response. Ganzoni equation calculates 1457 mg total iron deficit using target hemoglobin of 13.0.  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 MR revealed left ventricular ejection fraction 24% consistent from prior study, but LV thrombus at apex no longer visualized. Patient was not taking Eliquis  outpatient and was started on Eliquis  inpatient due to increased risk of additional LV thrombus in the future.  AKI, improving vs. resolved Improved. Hard to determine baseline given recent Cr values ranging from 1.07-1.98 in 2025.  Hyponatremia: resolved to 136.  Chronic osteomyelitis of R foot L TBI 0.44 PAD  Repeat ABI revealed worsening L TBI 0.44. Vascular assessed with ABI/TBI and aortogram 06/2023 with no stenting or angioplasty then. Patient was started on Asprin inpatient and instructed to  follow up with vascular surgery.

## 2023-10-12 NOTE — Progress Notes (Signed)
   10/12/23 1600  Spiritual Encounters  Type of Visit Initial  Care provided to: Patient  Reason for visit Routine spiritual support  OnCall Visit No  Spiritual Framework  Presenting Themes Coping tools;Goals in life/care;Meaning/purpose/sources of inspiration;Values and beliefs;Significant life change;Impactful experiences and emotions  Values/beliefs Christianity  Strengths Strong faith  Needs/Challenges/Barriers Emotional exhaustion; housing needs  Patient Stress Factors Exhausted;Loss of control;Health changes;Major life changes;Financial concerns  Interventions  Spiritual Care Interventions Made Established relationship of care and support;Compassionate presence;Reflective listening;Normalization of emotions;Narrative/life review;Explored values/beliefs/practices/strengths;Prayer;Encouragement;Self-care teaching  Intervention Outcomes  Outcomes Connection to spiritual care;Awareness around self/spiritual resourses;Connection to values and goals of care;Reduced anxiety;Reduced fear;Reduced isolation    Chaplain responded to spiritual consult request for prayer. Chaplain provided space for pt to share his emotional/spiritual exhaustion despite his robust faith. Chaplain made spiritual interventions as indicated above, and remains available.

## 2023-10-12 NOTE — Plan of Care (Signed)

## 2023-10-12 NOTE — Progress Notes (Signed)
 HD#0 Subjective:   Summary: Derek Thompson is a 63 y.o. male with pertinent PMH of HFrEF (20-25% 06/2023) with left ventricular thrombus, prostate cancer s/p radiation 2022 with urinary incontinence, T2DM, HLD, chronic normocytic anemia, and homelessness who presented with tachycardia and is admitted for tachycardia workup.   Overnight Events: Nursing reported that patient urinated in bed, so UA unable to be collected. Patient had pulses in the mid-90s to 110 down from 120s-130s s/p 2L IVF. He remained afebrile with normal MAPs and O2 saturations. GCS consistently 15.  Today, patient shared that he feels all right without fever, chills, chest pain, difficulty breathing, or dysuria. He reported frequent urinary leakage all day without him knowing at baseline. He reported that his last ABI was in 06/2023.  Objective:  Vital signs in last 24 hours: Vitals:   10/12/23 0330 10/12/23 0345 10/12/23 0615 10/12/23 0633  BP: 109/63 114/71 117/66 139/68  Pulse: (!) 102 98 (!) 106 (!) 115  Resp: 16 11 15 16   Temp:    (!) 97.5 F (36.4 C)  TempSrc:      SpO2: 100% 100% 100% 97%  Weight:      Height:       Supplemental O2: Room Air SpO2: 97 %   Physical Exam:  Constitutional: Well-appearing gentleman lying in bed, in no acute distress CV: Tachycardic and regular rhythm. No JVD appreciated. No lower extremity pitting edema appreciated. Pulm: normal work of breathing on room air, lungs with bibasilar crackles but otherwise clear to auscultation Extremities: Anterior lower extremities symmetrically warm to touch, no pitting edema . Feet: R foot s/p hallux amputation with large, black necrotic ulceration, stable from yesterday. Kindly view yesterday's photos in media tab. L foot in sock, not visualized.  Abd: Suprapubic distension and TTP with voluntary guarding. Abdomen otherwise soft, non-tender, and non-distended. Back: No CVA tenderness appreciated. Neuro: Alert, responding  appropriately. Psych: Normal mood and affect.  Pertinent Labs:    Latest Ref Rng & Units 10/11/2023    3:23 PM 09/11/2023    2:18 PM 09/04/2023    6:36 AM  CBC  WBC 4.0 - 10.5 K/uL 9.3  7.8  7.1   Hemoglobin 13.0 - 17.0 g/dL 8.6  60.4  8.6   Hematocrit 39.0 - 52.0 % 26.9  31.7  26.6   Platelets 150 - 400 K/uL 423  423  335        Latest Ref Rng & Units 10/12/2023    2:15 AM 10/11/2023    3:23 PM 09/11/2023    2:18 PM  CMP  Glucose 70 - 99 mg/dL 540  981  191   BUN 8 - 23 mg/dL 29  33  29   Creatinine 0.61 - 1.24 mg/dL 4.78  2.95  6.21   Sodium 135 - 145 mmol/L 129  126  136   Potassium 3.5 - 5.1 mmol/L 4.2  4.5  4.9   Chloride 98 - 111 mmol/L 103  98  103   CO2 22 - 32 mmol/L 19  19  18    Calcium  8.9 - 10.3 mg/dL 8.1  8.9  9.6    Micro: Blood cultures drawn without growth < 12 hours.  Imaging: CTA: No evidence of pulmonary embolism. Mild cardiomegaly with pulmonary edema. Perinephric stranding and hydronephrosis in the right kidney concerning for pyelonephritis and/or ureteral obstruction. Further evaluation with contrast-enhanced CT of abdomen and pelvis recommended.  Renal ultrasound: Moderate bilateral hydronephrosis and indeterminate thickening of the bladder wall. Recommended  correlation with cystoscopy.  Assessment/Plan:    Patient Summary: Derek Thompson is a 63 y.o. male with pertinent PMH of HFrEF with left ventricular thrombus, prostate cancer s/p radiation, urinary incontinence, T2DM, HLD, and chronic anemia,who presented with tachycardia and is admitted for tachycardia workup and urology workup, on hospital day 0.  Tachycardia Patient remains tachycardic to 100-115 s/p 2 L IVF. He remains HDS and afebrile. Physical exam revealed tachycardia, suprapubic fullness and tenderness without CVA tenderness, and reassuring R foot exam. Etiology of tachycardia likely multifactorial predominately due to dehydration with the history of low PO intake, SDOH, nausea/early  satiety with laboratory evidence of hyponatremia to 130. Tachycardia may also be due to possible infection with possible sources being cystitis vs chronic osteomyelitis. Patient does have perinephric fat stranding with suprapubic tenderness that is consistent with an urinary tract infection/pyelonephritis. R foot is an additional source of infection, although patient's osteomyelitis appears stable. Other causes include normocytic anemia.  Plan: - CBC tomorrow morning - Telemetry - Orthostatic vitals pending - Defer more IVF given hx of HFrEF and pulmonary edema on CTA - Defer further antibiotics with low suspicion for sepsis - Follow up CRP and Sed rate for evaluation of worsening osteomyelitis   AKI Hx of prostate cancer s/p radiation BPH Urinary incontinence Perinephric stranding and hydronephrosis of right kidney on CT Moderate bilateral hydronephrosis and indeterminate thickening of the bladder wall on US  AKI with most recent Cr 1.66 and BUN 29. Patient with history of prostate cancer and radiation in 2022 with subsequent urinary incontinence. Urinary incontinence is likely due to overflow incontinence and will require bladder scans to evaluate for retention. Imaging findings as above. Urology consulted and recommended CT scan for further evaluation; appreciate their recommendations. Plan: - CT scan in process - Bladder scan Q4H, may require coude catheter placement  - UA reflex to culture, catheterized - Holding spiro, Lasix , and metformin  iso AKI - Tamsulosin  0.4 mg daily at bedtime  Hyponatremia Likely due to hypovolemia which is further supported by the improvement from 126 to 129 after receiving IVF, but will evaluate for additional sources of hyponatremia.  Plan: - BMP this afternoon and tomorrow morning - Serum osmolality - Urine osmolality - Urine sodium  Biventricular HFrEF with LV thrombus, 06/2023 HFrEF LVEF 20-25% 06/2023 Patient was not complaint with outpatient  eliquis . We will evaluate size vs chronicity of the clot with an echocardiogram.  Plan: - TTE ordered - Metoprolol  succinate 25 mg daily - Eliquis  5 mg BID - Digoxin  0.125 mg daily  T2DM Nausea Early Satiety Nausea with meals and prefer small meals. Suspect diabetic gastroparesis. - Metoclopramide 5 mg PRN - Moderate SSI with CBGs - Holding metformin  in setting of AKI  Chronic normocytic anemia, stable Likely multifactorial: iron deficiency on last admission, possible ACD 2/2 HF and chronic osteomyelitis, and possible nutritional deficiency.  - CBC as above - Iron saturation ratio orders, will defer ferritin while acutely ill  - Screening colonoscopy outpatient  Chronic osteomyelitis of R foot Right foot ulceration looks stable with possible improvement of erythema. - CBC with differential - Follow up CRP and ESR  - PT/OT  PAD  HLD With evidence of necrotic ulceration and osteomyelitis with risk factors for PAD, will obtain ABIs.  - Lipitor  80 mg daily - Zetia  10mg  daily - ABI ordered  Diet: Normal IVF: s/p LR 2 L, deferring more fluids at this time VTE: Eliquis  5 mg BID Code: Full PT/OT recs: Pending  Dispo: Anticipated discharge to  Home in 1 days pending medical work-up.   Lynnea Satchel   Attestation for Student Documentation:  I personally was present and performed or re-performed the history, physical exam and medical decision-making activities of this service and have verified that the service and findings are accurately documented in the student's note.  Aurora Lees, DO 10/12/2023, 12:46 PM

## 2023-10-12 NOTE — H&P (Addendum)
 Date: 10/12/2023               Patient Name:  Derek Thompson MRN: [id number]  DOB: [birth year] Age / Sex: 63 y.o., male   PCP: Jearldine Mina, DO         Medical Service: Internal Medicine Teaching Service         Attending Physician: Dr. Cherylene Corrente, MD      First Contact: Dr. Aurora Lees, DO    Second Contact: Dr. Cleven Dallas, DO          After Hours (After 5p/  First Contact Pager: [phone]  weekends / holidays): Second Contact Pager: [phone]   SUBJECTIVE   Chief Complaint:  Chief Complaint  Patient presents with   Tachycardia   History of Present Illness:   Patient coming in after seeing PCP (Dr. Jari Merles) earlier today. He has a history of Osteomyelitis of the right foot and was recently admitted for the same earlier in May. At that time BKA was suggested but he decided to preserve his food and received 2 weeks of doxycycline  instead. He was to have close follow up with ortho but he was unable to due to transportation issues. He saw his PCP today and pt was tachycardic in the 120-130s after oral fluids, had no chest pain, SOB, n/v, fever but did not feel well. PCP instructed him to come in.   While at the ED he received 2L of fluids and HR has improved to the 110s. He remains afebrile and states that he feels better than what he did after his hospitalization in May.  He does endorse some fatigue and weakness after his hospitalization but this has improved with physical activity. He denies having any pain at the moment, no SOB, no difficulty breathing, no abdominal pain, although he does have intermittent nausea, which happens after he has large meals. Pt was homeless and now in transitional housing. He gets two large meals a day which he cannot fully eat as he appreciates having smaller meals. He denies this nausea increasing in frequency or worsening lately. He does not have any urinary symptoms, chronically incontinent after prostate cancer radiation therapy in 2022.  He denies any dysuria, worsening incontinence, discharge, back pain.  Has not had any fevers or chills. He overall states he feels better health wise than what he did when he was discharged. His foot is currently unchanged if not better for him than what it did last month. He completed doxycycline  a couple weeks ago.   Meds:   Left ventricular thrombus: Eliquis  5 mg twice daily (NOT TAKING) Hyperlipidemia: Atorvastatin  80 mg daily, Zetia  10 mg daily Diabetes: Farxiga  10 mg daily, metformin  500 mg daily HFrEF: Digoxin  0.125 mg daily, Farxiga  10 mg daily, metoprolol  succinate 25 mg daily Iron deficiency: Ferrous sulfate  325 mg daily Neuropathy: Gabapentin  300 mg twice daily (NOT TAKING) BPH: Flomax  0.4 mg nightly   Past Medical History  Past Surgical History:  Procedure Laterality Date   ABDOMINAL AORTOGRAM W/LOWER EXTREMITY N/A 06/16/2023   Procedure: ABDOMINAL AORTOGRAM W/LOWER EXTREMITY;  Surgeon: Carlene Che, MD;  Location: [facility] INVASIVE [facility];  Service: Cardiovascular;  Laterality: N/A;   AMPUTATION Right 06/21/2023   Procedure: RIGHT 2ND RAY AMPUTATION;  Surgeon: Timothy Ford, MD;  Location: [facility] OR;  Service: Orthopedics;  Laterality: Right;   DENTAL SURGERY     RADIOACTIVE SEED IMPLANT N/A 02/04/2021   Procedure: RADIOACTIVE SEED IMPLANT/BRACHYTHERAPY IMPLANT;  Surgeon: Lahoma Pigg, MD;  Location: [facility] ORS;  Service: Urology;  Laterality: N/A;   RIGHT/LEFT HEART CATH AND CORONARY ANGIOGRAPHY N/A 05/25/2020   Procedure: RIGHT/LEFT HEART CATH AND CORONARY ANGIOGRAPHY;  Surgeon: Swaziland, Peter M, MD;  [facility] INVASIVE CV LAB;  Service: Cardiovascular;  Laterality: N/A;   RIGHT/LEFT HEART CATH AND CORONARY ANGIOGRAPHY N/A 10/14/2022   Procedure: RIGHT/LEFT HEART CATH AND CORONARY ANGIOGRAPHY;  Surgeon: Mardell Shade, MD;  Location: [facility] IN[facility];  Service: Cardiovascular;  Laterality: N/A;   SPACE OAR INSTILLATION N/A 02/04/2021   Procedure: SPACE OAR INSTILLATION;   Surgeon: Lahoma Pigg, MD;  Location: [facility] OR[facility];  Service: Urology;  Laterality: N/A;    Social:  Lives With: Alone, independent on all ADLs and IADLs PCP: Jearldine Mina, DO Substances: : No alcohol use in 10 years, but 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 Has not smoked a cigarette in 10 years (earlier this year stated 6 years), but smoked 2ppd for ~35 years. No hx of IVDU but used to do cocaine and marijuana.    Family History:   Family History  Problem Relation Age of Onset   Breast cancer Mother        dx 30s/40s, twice   Cancer Maternal Grandfather        unknown type, d. >50    Allergies: Allergies as of 10/11/2023   (No Known Allergies)   Review of Systems: A complete ROS was negative except as per HPI.   OBJECTIVE:   Physical Exam: Blood pressure (!) 105/57, pulse (!) 116, temperature 97.7 F (36.5 C), temperature source Oral, resp. rate (!) 22, height 6' (1.829 m), weight 71.2 kg, SpO2 97%.  Constitutional: chronically ill-appearing, in NAD  HENT: normocephalic atraumatic, mucous membranes moist, pale Eyes: conjunctiva non-erythematous, non-jaundiced Neck: supple Cardiovascular: regular rate and rhythm, no m/r/g, no BLE edema, no JVD Pulmonary/Chest: normal work of breathing on room air, lungs clear to auscultation bilaterally Abdominal: soft,suprapubic tenderness present, no CVA tenderness  MSK: normal bulk and tone Neurological: alert & oriented x 3, 5/5 strength in bilateral upper and lower extremities, normal gait Skin: warm and dry  Labs: CBC    Component Value Date/Time   WBC 9.3 10/11/2023 1523   RBC 2.99 (L) 10/11/2023 1523   HGB 8.6 (L) 10/11/2023 1523   HGB 10.2 (L) 09/11/2023 1418   HCT 26.9 (L) 10/11/2023 1523   HCT 31.7 (L) 09/11/2023 1418   PLT 423 (H) 10/11/2023 1523   PLT 423 09/11/2023 1418   MCV 90.0 10/11/2023 1523   MCV 88 09/11/2023 1418   MCH 28.8 10/11/2023 1523   MCHC 32.0 10/11/2023 1523   RDW 15.0 10/11/2023 1523   RDW 17.2  (H) 09/11/2023 1418   LYMPHSABS 1.5 09/03/2023 0603   MONOABS 0.9 09/03/2023 0603   EOSABS 0.2 09/03/2023 0603   BASOSABS 0.0 09/03/2023 0603     CMP     Component Value Date/Time   NA 126 (L) 10/11/2023 1523   NA 136 09/11/2023 1418   K 4.5 10/11/2023 1523   CL 98 10/11/2023 1523   CO2 19 (L) 10/11/2023 1523   GLUCOSE 252 (H) 10/11/2023 1523   BUN 33 (H) 10/11/2023 1523   BUN 29 (H) 09/11/2023 1418   CREATININE 1.83 (H) 10/11/2023 1523   CALCIUM  8.9 10/11/2023 1523   PROT 8.5 (H) 09/01/2023 1203   ALBUMIN 3.2 (L) 09/01/2023 1203   AST 15 09/01/2023 1203   ALT 22 09/01/2023 1203   ALKPHOS 72 09/01/2023 1203  BILITOT 0.5 09/01/2023 1203   GFRNONAA 41 (L) 10/11/2023 1523   GFRAA 89 06/10/2020 1645    Imaging:  EKG: personally reviewed my interpretation is sinus tachycardia with LBBB  ASSESSMENT & PLAN:   Assessment & Plan by Problem: Principal Problem:   Tachycardia Active Problems:   Chronic HFrEF (heart failure with reduced ejection fraction) (HCC)   DM2 (diabetes mellitus, type 2) (HCC)   AKI (acute kidney injury) (HCC)   Chronic osteomyelitis of toe, right (HCC)   LV (left ventricular) mural thrombus without MI (HCC)   Hyponatremia   Derek Thompson is a 63 y.o. with a PMH of T2DM, osteomyelitis of the R foot s/p 1st and 2nd ray amputation, HFrEF (20-25% 06/2023) and previous CVA on 06/2023 and a LV thrombus not on any anticoagulation coming in for concerns of tachycardia.  Tachycardia  Hemodynamically stable. EKG cw sinus tachycardia. Hyponatremic. Rates at PCP were in the 120s-130s and not correctible with oral fluids. S/p 2L LR in the ED and rates have somewhat improved to the 110s. Could be related to hypovolemia. However, he does have suprapubic tenderness on exam and an incidental finding of perinephric stranding on CTA. He also has a significant hx of prostate CA s/p radiation therapy in 2022. Largely asymptomatic except for suprapubic tenderness on  exam. He is not able to feel more urgency than his baseline, no dysuria, or discharge. No CVA tenderness, no worsening N/V.  Not retaining urine. No leukocytosis, lactate WNL. He does not have any fevers or worsening signs of infection in his R foot. I do wonder if this stems from a urinary issue. Negative for PE on CTA. Hx of anemia appears stable.  -Tele -UA reflex culture -Renal US   -Urine Osm, sodium  -Serum Osmolality -Sp 2L LR at the ED, will hold from more given HFrEF (20-25%) -cw Metoprolol  xL 25mg  daily  AKI  Baseline is close to 1.1 and currently at 1.66. Concern for urinary retention given prostate CA hx and urinary incontinence with incidental finding of perinephric stranding. However, bladder scan inconsistent with urinary retention.  -holding home spironolactone , furosemide , metformin   -caution with renally active agents -renal US   T2DM  Hgb A1c was 8.7 two months ago. Glucose is 252 with bicarb of 10 and normal AG. Takes metformin  1000mg  BID, was switched from insulin  as he had a fear of using it in the past due to him having food insecurity and using too much insulin . SGLT2 considered given heart failure, however it is too expensive for him.  He has been taking the metfomin consistently with no worsening of his nausea or vomiting.  -Hold metformin  in setting of AKI  -moderate SSI  -CBGs  Chronic Osteomyelitis of the R foot  Unchanged if not partly improved since last hospitalization. Pt denied having BKA and attempting to preserve foot instead. Completed doxycline abx course about 2 weeks ago. He chronically is not able to feel anything on his feet due to neuropathy. However, foot is not warm, erythematous. Remains afebrile, no leukocytosis. Pt reports feeling a lot better now and that his foot looks better. May benefit from PAD treatment to aid with wound healing.  -CTM  Chronic biventricular HFrEF  Currently well controlled and euvolemic on exam. Most recent EF of 20-25%  on 06/2023 echo. Global hypokinesis and LV dilatation noted. Mildly reduced R ventricular systolic function with mildly elecated pulmonary artery pressures. Has moderate MR. Controlled on: -Spironolactone  25mg  daily - held due to AKI -Metoprolol  XL 25mg   daily  -Furosemide  20mg  daily -held due to AKI -Digoxin  0.125mg  daily  -Farxiga  10mg  daily -held in setting of hypovolemia  Hx LV Thrombus  Found after stroke on 06/2023 was dc on Eliquis  5mg  BID which he cannot afford currently. Has been off Eliquis .   Hx of Anemia  Hgb currently at 8.6, stable from last admission. Iron panel collected then showed a saturation ratio of 14% and low iron (21) cw Iron deficiency anemia. Was discharged on iron supplementation but unsure if he is currently taking due to financial instability. Has not gotten a colonoscopy in the past.   Nausea Early Satiety States that he has nausea whenever he eats meals and has to eat small meals in able to tolerate his meals. He states he has always been like this, but I do worry with his neuropathy and poorly controlled diabetes that he may have a component of diabetic gastroparesis. -Metaclopramide 5mg  PRN -Consider gastric emptying study in the future if it worsens   SDOH affecting healthcare Pt has housing instability was homeless but now has transitional housing. Cannot afford his meals and will go many hours without eating, he much less cannot afford his medications at this time.  -TOC consult  PAD  Was prescribed pentoxifylline  in the past but not currently taking.   HLD Resumed home lipitor  80 mg daily. Also taking Zetia  10mg  daily.   BPH  Cw flomax  0.4mg  nightly  Diet: Regular VTE: Eliquis  IVF: LR,2L Code: Full  Prior to Admission Living Arrangement: Transitional housing Anticipated Discharge Location: Home Barriers to Discharge: medical work-up  Dispo: Admit patient to Inpatient with expected length of stay greater than 2 midnights.  Signed: [facility]  Internal Medicine Resident, PGY-1 [facility]  Pager: [phone]  10/12/2023, 2:35 AM

## 2023-10-12 NOTE — Telephone Encounter (Signed)
 Called patient Derek Thompson with appointment information with [facility] on 10-16-2023 @ 2:30 pm. If unable to keep appointment to call the clinic office at [phone] to reschedule or cancel/ appointment also mailed to patient.

## 2023-10-12 NOTE — Consult Note (Addendum)
 Urology Consult Note   Requesting Attending Physician:  Cherylene Corrente, MD Service Providing Consult: Urology  Consulting Attending: Dr. Freddi Jaeger   Reason for Consult:  bilateral hydronephrosis on renal US   HPI: Derek Thompson is seen in consultation for reasons noted above at the request of Cherylene Corrente, MD. patient was directed to [facility] emergency department from resident clinic out of concern for sustained tachycardia, possible worsening AKI, and un-resolving osteomyelitis of right toe.  During workup right side hydronephrosis and perinephric stranding was appreciated on CT angiogram.  Renal ultrasound was ordered which noted moderate bilateral hydronephrosis along with bladder thickening.  Patient's renal function had acutely worsened without clear etiology.  Urology has been consulted to speak to CT findings.   On arrival patient was alert, oriented, and resting in bed.  He is of sound mind and recalled our practice and Dr. Freddi Jaeger, who placed his brachytherapy in 2022.  He was instructed to follow-up but has been lost since then.  He stated that he has had a shortage of resources and a lot of doctors appointments.  We reviewed the case and plan and he was amenable to Foley catheter placement    ------------------  Assessment:  63 y.o. male with bilateral hydronephrosis and CKD   Recommendations: #bilateral hydronephrosis Bladder wall thickening and bilateral hydronephrosis appreciated on ultrasound.    CT A/P is difficult to interpret due to retained contrast following angiogram.  There is unobstructed communication from the ureters to the level of the bladder however.  This is presumably bladder outlet obstruction due to his prostate and his ongoing urinary leakage is likely overflow.  Recommend placing Foley catheter to maximize drainage and close follow-up in clinic to resume his cancer treatment and discuss voiding trial.  #prostate cancer Radioactive seed  placement by Dr. Freddi Jaeger on 02/04/2021 Scheduled follow up  with us , last seen January 2024.  Ultrasensitive PSA collected July 2024 < 0.015.  #CKD SCr 1.66 today, keeping with his baseline since   Urology will follow peripherally  Case and plan discussed with Dr. Freddi Jaeger  Past Medical History: Past Medical History:  Diagnosis Date   Anemia    Diabetes mellitus without complication (HCC) 1987   Family history of breast cancer    Hypertension    Myocardial infarction [facility])    Prostate cancer [facility])     Past Surgical History:  Past Surgical History:  Procedure Laterality Date   ABDOMINAL AORTOGRAM W/LOWER EXTREMITY N/A 06/16/2023   Procedure: ABDOMINAL AORTOGRAM W/LOWER EXTREMITY;  Surgeon: Carlene Che, MD;  Location: [facility] INVASIVE CV LAB;  Service: Cardiovascular;  Laterality: N/A;   AMPUTATION Right 06/21/2023   Procedure: RIGHT 2ND RAY AMPUTATION;  Surgeon: Timothy Ford, MD;  Location: [facility] OR;  Service: Orthopedics;  Laterality: Right;   DENTAL SURGERY     RADIOACTIVE SEED IMPLANT N/A 02/04/2021   Procedure: RADIOACTIVE SEED IMPLANT/BRACHYTHERAPY IMPLANT;  Surgeon: Lahoma Pigg, MD;  Location: [facility];  Service: Urology;  Laterality: N/A;   RIGHT/LEFT HEART CATH AND CORONARY ANGIOGRAPHY N/A 05/25/2020   Procedure: RIGHT/LEFT HEART CATH AND CORONARY ANGIOGRAPHY;  Surgeon: Swaziland, Peter M, MD;  Location: [facility] INVASIVE CV LAB;  Service: Cardiovascular;  Laterality: N/A;   RIGHT/LEFT HEART CATH AND CORONARY ANGIOGRAPHY N/A 10/14/2022   Procedure: RIGHT/LEFT HEART CATH AND CORONARY ANGIOGRAPHY;  Surgeon: Mardell Shade, MD;  Location: [facility];  Service: Cardiovascular;  Laterality: N/A;   SPACE OAR INSTILLATION N/A 02/04/2021   Procedure: SPACE  OAR INSTILLATION;  Surgeon: Lahoma Pigg, MD;  Location: [facility];  Service: Urology;  Laterality: N/A;    Medication: Current Facility-Administered Medications  Medication Dose Route Frequency Provider Last Rate Last Admin   apixaban   (ELIQUIS ) tablet 5 mg  5 mg Oral BID McLendon, Michael, MD   5 mg at 10/12/23 1025   atorvastatin  (LIPITOR ) tablet 80 mg  80 mg Oral Daily McLendon, Michael, MD   80 mg at 10/12/23 1025   digoxin  (LANOXIN ) tablet 0.125 mg  0.125 mg Oral Daily McLendon, Michael, MD   0.125 mg at 10/12/23 1025   ezetimibe  (ZETIA ) tablet 10 mg  10 mg Oral Daily McLendon, Michael, MD   10 mg at 10/12/23 1025   insulin  aspart (novoLOG ) injection 0-15 Units  0-15 Units Subcutaneous TID WC McLendon, Michael, MD       metoCLOPramide (REGLAN) tablet 5 mg  5 mg Oral Q6H PRN Alexander-Savino, , MD       metoprolol  succinate (TOPROL -XL) 24 hr tablet 25 mg  25 mg Oral Daily McLendon, Michael, MD   25 mg at 10/12/23 1025   tamsulosin  (FLOMAX ) capsule 0.4 mg  0.4 mg Oral QHS Alexander-Savino, [place], MD        Allergies: No Known Allergies  Social History: Social History   Tobacco Use   Smoking status: Former    Current packs/day: 0.00    Average packs/day: 1 pack/day for 43.0 years (43.0 ttl pk-yrs)    Types: Cigarettes    Start date: 05/22/1974    Quit date: 05/22/2017    Years since quitting: 6.3   Smokeless tobacco: Never   Tobacco comments:    Quit 2019 about 1PPD  Vaping Use   Vaping status: Never Used  Substance Use Topics   Alcohol use: Not Currently    Comment: last drink 2015   Drug use: Not Currently    Comment: none in 7 years    Family History Family History  Problem Relation Age of Onset   Breast cancer Mother        dx 30s/40s, twice   Cancer Maternal Grandfather        unknown type, d. >50    Review of Systems  Genitourinary:  Positive for dysuria. Negative for flank pain, frequency, hematuria and urgency.     Objective   Vital signs in last 24 hours: BP 139/68 (BP Location: Right Wrist)   Pulse (!) 115   Temp (!) 97.5 F (36.4 C)   Resp 16   Ht 6' (1.829 m)   Wt 71.2 kg   SpO2 97%   BMI 21.29 kg/m   Physical Exam General: A&O, resting, appropriate HEENT:  [place]/AT Pulmonary: Normal work of breathing Cardiovascular: no cyanosis Abdomen: Soft, NTTP, nondistended GU: Pure wick in place draining clear yellow urine Neuro: Appropriate, no focal neurological deficits  Most Recent Labs: Lab Results  Component Value Date   WBC 9.3 10/11/2023   HGB 8.6 (L) 10/11/2023   HCT 26.9 (L) 10/11/2023   PLT 423 (H) 10/11/2023    Lab Results  Component Value Date   NA 129 (L) 10/12/2023   K 4.2 10/12/2023   CL 103 10/12/2023   CO2 19 (L) 10/12/2023   BUN 29 (H) 10/12/2023   CREATININE 1.66 (H) 10/12/2023   CALCIUM  8.1 (L) 10/12/2023   MG 2.0 10/11/2023   PHOS 2.7 06/21/2023    Lab Results  Component Value Date   INR 1.7 (H) 06/14/2023   APTT 32 06/14/2023  Urine Culture: @[id number] (laburin,org,[id number])@   IMAGING: US  RENAL Result Date: 10/12/2023 CLINICAL DATA:  Hydronephrosis EXAM: RENAL / URINARY TRACT ULTRASOUND COMPLETE COMPARISON:  06/14/2023 ultrasound FINDINGS: Right Kidney: Renal measurements: 11.0 x 5.6 x 4.6 cm = volume: 147 mL. Echogenicity within normal limits. No mass. Moderate hydronephrosis. Left Kidney: Renal measurements: 10.5 x 4.5 x 4.9 cm = volume: 119 mL. Echogenicity within normal limits. Moderate hydronephrosis. No mass. Bladder: Thickening of the bladder wall at the bladder base. Other: Limited views of the left kidney due to patient positioning. Patient was asleep in would not wake up. IMPRESSION: 1. Moderate bilateral hydronephrosis. 2. Indeterminate thickening of the bladder wall. This could be due to debris or malignancy. Recommend correlation with cystoscopy. Electronically Signed   By: Rozell Cornet M.D.   On: 10/12/2023 03:14   CT Angio Chest PE W/Cm &/Or Wo Cm Result Date: 10/11/2023 CLINICAL DATA:  Pulmonary embolism suspected, positive D-dimer, tachycardia EXAM: CT ANGIOGRAPHY CHEST WITH CONTRAST TECHNIQUE: Multidetector CT imaging of the chest was performed using the standard protocol during bolus  administration of intravenous contrast. Multiplanar CT image reconstructions and MIPs were obtained to evaluate the vascular anatomy. RADIATION DOSE REDUCTION: This exam was performed according to the departmental dose-optimization program which includes automated exposure control, adjustment of the mA and/or kV according to patient size and/or use of iterative reconstruction technique. CONTRAST:  75mL OMNIPAQUE  IOHEXOL  350 MG/ML SOLN COMPARISON:  Chest radiograph 06/27/2023; CT chest 05/22/2020; PET/CT 07/11/2020 FINDINGS: Cardiovascular: Negative for acute pulmonary embolism. Normal caliber thoracic aorta. Coronary artery and aortic atherosclerotic calcification. Mild cardiomegaly. Mediastinum/Nodes: Calcified mediastinal and hilar lymph nodes. Trachea and esophagus are unremarkable. Lungs/Pleura: Interlobular and peribronchial thickening greatest in the lower lobes. Patchy ground-glass opacities in the lower lobes. Findings favor edema. No focal consolidation, pleural effusion, or pneumothorax. Upper Abdomen: Partially visualized hazy stranding about the right kidney. Right hydronephrosis with possible thickening and hyperenhancement of the ureteral urothelium. Prominent left extrarenal pelvis. Musculoskeletal: No acute fracture. Review of the MIP images confirms the above findings. IMPRESSION: 1. Negative for acute pulmonary embolism. 2. Mild cardiomegaly with pulmonary edema. 3. Perinephric stranding and hydronephrosis in the right kidney. Findings are concerning for pyelonephritis and/or ureteral obstruction. Further evaluation with contrast-enhanced CT of the abdomen and pelvis is recommended. 4. Aortic Atherosclerosis (ICD10-I70.0). Electronically Signed   By: Rozell Cornet M.D.   On: 10/11/2023 23:30    ------  Alla Ar, NP Pager: [phone]   Please contact the urology consult pager with any further questions/concerns.  I have seen and examined the patient and agree with the  above assessment and plan.  Keep foley catheter to gravity. Will plan on void trial in at least 5 days, will arrange outpatient. May require urolift or TURP for bladder outlet obstruction.  Matt R. Falana Clagg MD [facility]  Pager: [phone]

## 2023-10-13 ENCOUNTER — Inpatient Hospital Stay (HOSPITAL_COMMUNITY)

## 2023-10-13 ENCOUNTER — Observation Stay (HOSPITAL_BASED_OUTPATIENT_CLINIC_OR_DEPARTMENT_OTHER)

## 2023-10-13 DIAGNOSIS — I428 Other cardiomyopathies: Secondary | ICD-10-CM

## 2023-10-13 DIAGNOSIS — E11621 Type 2 diabetes mellitus with foot ulcer: Secondary | ICD-10-CM | POA: Diagnosis present

## 2023-10-13 DIAGNOSIS — M86171 Other acute osteomyelitis, right ankle and foot: Secondary | ICD-10-CM | POA: Diagnosis not present

## 2023-10-13 DIAGNOSIS — I5082 Biventricular heart failure: Secondary | ICD-10-CM | POA: Diagnosis present

## 2023-10-13 DIAGNOSIS — L97516 Non-pressure chronic ulcer of other part of right foot with bone involvement without evidence of necrosis: Secondary | ICD-10-CM | POA: Diagnosis present

## 2023-10-13 DIAGNOSIS — E1122 Type 2 diabetes mellitus with diabetic chronic kidney disease: Secondary | ICD-10-CM | POA: Diagnosis present

## 2023-10-13 DIAGNOSIS — N136 Pyonephrosis: Secondary | ICD-10-CM | POA: Diagnosis present

## 2023-10-13 DIAGNOSIS — N39490 Overflow incontinence: Secondary | ICD-10-CM | POA: Diagnosis present

## 2023-10-13 DIAGNOSIS — R000 Tachycardia, unspecified: Secondary | ICD-10-CM | POA: Diagnosis present

## 2023-10-13 DIAGNOSIS — E1143 Type 2 diabetes mellitus with diabetic autonomic (poly)neuropathy: Secondary | ICD-10-CM | POA: Diagnosis present

## 2023-10-13 DIAGNOSIS — N401 Enlarged prostate with lower urinary tract symptoms: Secondary | ICD-10-CM | POA: Diagnosis not present

## 2023-10-13 DIAGNOSIS — M86671 Other chronic osteomyelitis, right ankle and foot: Secondary | ICD-10-CM | POA: Diagnosis present

## 2023-10-13 DIAGNOSIS — N3000 Acute cystitis without hematuria: Secondary | ICD-10-CM | POA: Diagnosis not present

## 2023-10-13 DIAGNOSIS — D631 Anemia in chronic kidney disease: Secondary | ICD-10-CM | POA: Diagnosis present

## 2023-10-13 DIAGNOSIS — I130 Hypertensive heart and chronic kidney disease with heart failure and stage 1 through stage 4 chronic kidney disease, or unspecified chronic kidney disease: Secondary | ICD-10-CM | POA: Diagnosis present

## 2023-10-13 DIAGNOSIS — E1140 Type 2 diabetes mellitus with diabetic neuropathy, unspecified: Secondary | ICD-10-CM | POA: Diagnosis present

## 2023-10-13 DIAGNOSIS — E785 Hyperlipidemia, unspecified: Secondary | ICD-10-CM | POA: Diagnosis present

## 2023-10-13 DIAGNOSIS — M869 Osteomyelitis, unspecified: Secondary | ICD-10-CM | POA: Diagnosis not present

## 2023-10-13 DIAGNOSIS — N12 Tubulo-interstitial nephritis, not specified as acute or chronic: Secondary | ICD-10-CM | POA: Diagnosis not present

## 2023-10-13 DIAGNOSIS — N179 Acute kidney failure, unspecified: Secondary | ICD-10-CM | POA: Diagnosis present

## 2023-10-13 DIAGNOSIS — K3184 Gastroparesis: Secondary | ICD-10-CM | POA: Diagnosis present

## 2023-10-13 DIAGNOSIS — E871 Hypo-osmolality and hyponatremia: Secondary | ICD-10-CM | POA: Diagnosis present

## 2023-10-13 DIAGNOSIS — I513 Intracardiac thrombosis, not elsewhere classified: Secondary | ICD-10-CM | POA: Diagnosis present

## 2023-10-13 DIAGNOSIS — Y842 Radiological procedure and radiotherapy as the cause of abnormal reaction of the patient, or of later complication, without mention of misadventure at the time of the procedure: Secondary | ICD-10-CM | POA: Diagnosis present

## 2023-10-13 DIAGNOSIS — I5022 Chronic systolic (congestive) heart failure: Secondary | ICD-10-CM | POA: Diagnosis present

## 2023-10-13 DIAGNOSIS — Z5901 Sheltered homelessness: Secondary | ICD-10-CM | POA: Diagnosis not present

## 2023-10-13 DIAGNOSIS — Z89431 Acquired absence of right foot: Secondary | ICD-10-CM | POA: Diagnosis not present

## 2023-10-13 DIAGNOSIS — E1152 Type 2 diabetes mellitus with diabetic peripheral angiopathy with gangrene: Secondary | ICD-10-CM | POA: Diagnosis present

## 2023-10-13 DIAGNOSIS — E1169 Type 2 diabetes mellitus with other specified complication: Secondary | ICD-10-CM | POA: Diagnosis present

## 2023-10-13 DIAGNOSIS — I70261 Atherosclerosis of native arteries of extremities with gangrene, right leg: Secondary | ICD-10-CM | POA: Diagnosis present

## 2023-10-13 DIAGNOSIS — N189 Chronic kidney disease, unspecified: Secondary | ICD-10-CM | POA: Diagnosis present

## 2023-10-13 DIAGNOSIS — E1165 Type 2 diabetes mellitus with hyperglycemia: Secondary | ICD-10-CM | POA: Diagnosis present

## 2023-10-13 LAB — BASIC METABOLIC PANEL WITH GFR
Anion gap: 11 (ref 5–15)
BUN: 22 mg/dL (ref 8–23)
CO2: 19 mmol/L — ABNORMAL LOW (ref 22–32)
Calcium: 8.6 mg/dL — ABNORMAL LOW (ref 8.9–10.3)
Chloride: 106 mmol/L (ref 98–111)
Creatinine, Ser: 1.52 mg/dL — ABNORMAL HIGH (ref 0.61–1.24)
GFR, Estimated: 51 mL/min — ABNORMAL LOW (ref >60)
Glucose, Bld: 96 mg/dL (ref 70–99)
Potassium: 4.3 mmol/L (ref 3.5–5.1)
Sodium: 136 mmol/L (ref 135–145)

## 2023-10-13 LAB — URINE CULTURE

## 2023-10-13 LAB — IRON AND TIBC
Iron: 18 ug/dL — ABNORMAL LOW (ref 45–182)
Saturation Ratios: 11 % — ABNORMAL LOW (ref 17.9–39.5)
TIBC: 165 ug/dL — ABNORMAL LOW (ref 250–450)
UIBC: 147 ug/dL

## 2023-10-13 LAB — CBC
HCT: 22.8 % — ABNORMAL LOW (ref 39.0–52.0)
Hemoglobin: 7.3 g/dL — ABNORMAL LOW (ref 13.0–17.0)
MCH: 28.7 pg (ref 26.0–34.0)
MCHC: 32 g/dL (ref 30.0–36.0)
MCV: 89.8 fL (ref 80.0–100.0)
Platelets: 381 10*3/uL (ref 150–400)
RBC: 2.54 MIL/uL — ABNORMAL LOW (ref 4.22–5.81)
RDW: 15.2 % (ref 11.5–15.5)
WBC: 7.9 10*3/uL (ref 4.0–10.5)
nRBC: 0 % (ref 0.0–0.2)

## 2023-10-13 LAB — URINALYSIS, W/ REFLEX TO CULTURE (INFECTION SUSPECTED)
Bilirubin Urine: NEGATIVE
Glucose, UA: NEGATIVE mg/dL
Ketones, ur: NEGATIVE mg/dL
Nitrite: NEGATIVE
Protein, ur: 100 mg/dL — AB
RBC / HPF: >50 RBC/hpf (ref 0–5)
Specific Gravity, Urine: 1.012 (ref 1.005–1.030)
WBC, UA: >50 WBC/hpf (ref 0–5)
pH: 5 (ref 5.0–8.0)

## 2023-10-13 LAB — RETICULOCYTES
Immature Retic Fract: 18.1 % — ABNORMAL HIGH (ref 2.3–15.9)
RBC.: 2.53 MIL/uL — ABNORMAL LOW (ref 4.22–5.81)
Retic Count, Absolute: 31.6 10*3/uL (ref 19.0–186.0)
Retic Ct Pct: 1.3 % (ref 0.4–3.1)

## 2023-10-13 LAB — FOLATE: Folate: 8.5 ng/mL (ref >5.9)

## 2023-10-13 LAB — VAS US ABI WITH/WO TBI
Left ABI: 1.12
Right ABI: 1.02

## 2023-10-13 LAB — GLUCOSE, CAPILLARY
Glucose-Capillary: 98 mg/dL (ref 70–99)
Glucose-Capillary: 163 mg/dL — ABNORMAL HIGH (ref 70–99)
Glucose-Capillary: 146 mg/dL — ABNORMAL HIGH (ref 70–99)
Glucose-Capillary: 161 mg/dL — ABNORMAL HIGH (ref 70–99)

## 2023-10-13 LAB — VITAMIN B12: Vitamin B-12: 459 pg/mL (ref 180–914)

## 2023-10-13 MED ORDER — GADOBUTROL 1 MMOL/ML IV SOLN: 10.0000 mL | Freq: Once | INTRAVENOUS | Status: AC | PRN

## 2023-10-13 MED ORDER — FERROUS SULFATE 325 (65 FE) MG PO TABS: 325.0000 mg | ORAL_TABLET | Freq: Every day | ORAL | Status: DC

## 2023-10-13 NOTE — Evaluation (Signed)
 Physical Therapy Evaluation Patient Details Name: Derek Thompson MRN: [id number] DOB: March 27, 1961 Today's Date: 10/13/2023  History of Present Illness  Pt is a 63 y/o M presenting to ED on 10/11/23 with tachycardia. Also with concern for urinary retention and AKI with Urology consulting. 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 Pt was able to ambulate with RW and no physical assistance given with heart rate remaining below 120. Plan to trial [facility] next session as pt demonstrated good balance with RW and often utilizes SPC at baseline when needing an AD for improved balance. PT will continue to treat pt while he is admitted. Discussed possibility of follow-up physical therapy with pt and pt feels he is close to his baseline and will not need any more PT after discharge. No follow up therapies recommended.        If plan is discharge home, recommend the following: A little help with walking and/or transfers;A little help with bathing/dressing/bathroom;Assist for transportation   Can travel by private vehicle        Equipment Recommendations None recommended by PT  Recommendations for Other Services       Functional Status Assessment Patient has had a recent decline in their functional status and demonstrates the ability to make significant improvements in function in a reasonable and predictable amount of time.     Precautions / Restrictions Precautions Precautions: Fall Recall of Precautions/Restrictions: Intact Restrictions Weight Bearing Restrictions Per Provider Order: No      Mobility  Bed Mobility Overal bed  mobility: Needs Assistance Bed Mobility: Supine to Sit, Sit to Supine     Supine to sit: Supervision, HOB elevated, Used rails Sit to supine: Supervision, HOB elevated, Used rails   General bed mobility comments: increased time to complete    Transfers Overall transfer level: Needs assistance Equipment used: Rolling walker (2 wheels) Transfers: Sit to/from Stand Sit to Stand: Contact guard assist           General transfer comment: VC given for sequencing; increased time to complete.    Ambulation/Gait Ambulation/Gait assistance: Contact guard assist Gait Distance (Feet): 200 Feet Assistive device: Rolling walker (2 wheels) Gait Pattern/deviations: Step-through pattern, Decreased stride length, Decreased step length - right, Decreased stance time - right, Decreased weight shift to left Gait velocity: reduced Gait velocity interpretation: <1.8 ft/sec, indicate of risk for recurrent falls   General Gait Details: Pt ambulates with legs in significant external rotation, R>L with pt reporting this is baseline. Pt demonstrates reciprocal gait pattern with reduced cadence, reduced knee flexion, and midfoot strike.  Stairs            Wheelchair Mobility     Tilt Bed    Modified Rankin (Stroke Patients Only)       Balance Overall balance assessment: Needs assistance Sitting-balance support: No upper extremity supported, Feet supported Sitting balance-Leahy Scale: Good     Standing balance support: Bilateral upper extremity supported, During functional activity, Reliant on assistive device for balance Standing balance-Leahy Scale: Poor  Pertinent Vitals/Pain Pain Assessment Pain Assessment: Faces Faces Pain Scale: Hurts little more Pain Location: R foot Pain Descriptors / Indicators: Discomfort, Grimacing, Guarding Pain Intervention(s): Limited activity within patient's tolerance, Monitored during session    Home  Living Family/patient expects to be discharged to:: Shelter/Homeless                   Additional Comments: Pt reports he has been living at [facility] and plans to return following DC. Pt reports it's one story with a level entry.    Prior Function Prior Level of Function : Independent/Modified Independent             Mobility Comments: Pt reports he was independent-modI utilizing SPC or rollator for ambulation. Trialed WC for mobility but felt it was too cumbersome. Pt reports recently he has been ambulating without an AD, walking several laps around the house. ADLs Comments: Pt reports independent with ADLs     Extremity/Trunk Assessment   Upper Extremity Assessment Upper Extremity Assessment: Defer to OT evaluation    Lower Extremity Assessment Lower Extremity Assessment: Generalized weakness    Cervical / Trunk Assessment Cervical / Trunk Assessment: Kyphotic  Communication   Communication Communication: No apparent difficulties    Cognition Arousal: Alert Behavior During Therapy: WFL for tasks assessed/performed   PT - Cognitive impairments: No apparent impairments                         Following commands: Intact       Cueing Cueing Techniques: Verbal cues, Visual cues     General Comments General comments (skin integrity, edema, etc.): HR remained below 120 for entirity of sesssion    Exercises     Assessment/Plan    PT Assessment Patient needs continued PT services  PT Problem List Decreased strength;Decreased activity tolerance;Decreased mobility;Decreased range of motion;Decreased balance;Decreased knowledge of use of DME;Pain       PT Treatment Interventions DME instruction;Gait training;Stair training;Functional mobility training;Therapeutic exercise;Therapeutic activities;Balance training;Neuromuscular re-education;Patient/family education;Wheelchair mobility training;Manual techniques;Modalities    PT Goals (Current  goals can be found in the Care Plan section)  Acute Rehab PT Goals Patient Stated Goal: to continue building strength in legs PT Goal Formulation: With patient Time For Goal Achievement: 10/27/23 Potential to Achieve Goals: Good    Frequency Min 1X/week     Co-evaluation               AM-PAC PT 6 Clicks Mobility  Outcome Measure Help needed turning from your back to your side while in a flat bed without using bedrails?: A Little Help needed moving from lying on your back to sitting on the side of a flat bed without using bedrails?: A Little Help needed moving to and from a bed to a chair (including a wheelchair)?: A Little Help needed standing up from a chair using your arms (e.g., wheelchair or bedside chair)?: A Little Help needed to walk in hospital room?: A Little Help needed climbing 3-5 steps with a railing? : A Lot 6 Click Score: 17    End of Session Equipment Utilized During Treatment: Gait belt Activity Tolerance: Patient tolerated treatment well Patient left: in bed;with call bell/phone within reach;with nursing/sitter in room Nurse Communication: Mobility status PT Visit Diagnosis: Unsteadiness on feet (R26.81);Muscle weakness (generalized) (M62.81);Pain Pain - Right/Left: Right Pain - part of body: Ankle and joints of foot    Time: 1531-1559 PT Time Calculation (min) (ACUTE ONLY): 28 min  Charges:   PT Evaluation $PT Eval Low Complexity: 1 Low   PT General Charges $$ ACUTE PT VISIT: 1 Visit         Lonell Rives, SPT Acute Rehab [phone]   Lonell Rives 10/13/2023, 5:42 PM

## 2023-10-13 NOTE — Progress Notes (Signed)
 Heart Failure Navigator Progress Note  Assessed for Heart & Vascular TOC clinic readiness.  Patient does not meet criteria due to Advanced Heart Failure Team patient of Dr. Gala Romney. .   Navigator will sign off at this time.   Rhae Hammock, BSN, Scientist, clinical (histocompatibility and immunogenetics) Only

## 2023-10-13 NOTE — Progress Notes (Signed)
 HD#0 Subjective:   Summary: Derek Thompson is a 63 y.o. 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   Overnight Events: None.  Patient reported sleeping well overnight. Patient has been eating and drinking some with less nausea on metoclopramide . He denied fevers, chills, chest pain, shortness of breath, and orthopnea.  We explained his CT findings and plan to treat his cystitis and pyelonephritis with antibiotics. We also shared TTE findings and plan for repeat cardiac imaging with MRI wo contrast. He was amenable to this plan.  He endorsed having had a BM during his hospitalization that was brown in color. He denied dark brown/black stools, bloody stools, bloody vomit, or visible blood in his urine. We explained that we asked about bleeding because his hemoglobin decreased, and he expressed understanding after clarification.  Objective:  Vital signs in last 24 hours: Vitals:   10/12/23 1924 10/13/23 0508 10/13/23 0817 10/13/23 1416  BP: 105/68 118/67 105/65 102/68  Pulse: (!) 117 (!) 108 (!) 105 (!) 101  Temp: 99.5 F (37.5 C) 98.7 F (37.1 C) 98.5 F (36.9 C) 98.5 F (36.9 C)  Resp: 19 18    Height:      Weight:      SpO2: 100% 100% 98% 97%  TempSrc:      BMI (Calculated):        Supplemental O2: Room Air SpO2: 97 %   Physical Exam:  Constitutional: Well-appearing, in no acute distress. CV: Tachycardic, regular rhythm. JVD to mid-neck. No lower extremity edema appreciated bilaterally. Pulm: Normal work of breathing on room air,  no bibasilar crackles appreciated. Abdominal: No suprapubic fullness or tenderness to palpation, improved from yesterday. GU: Foley catheter in place with frothy dark urine  Neurological: Alert,  responding and asking questions appropriately. Psych: Normal mood and affect.  Pertinent Labs:    Latest Ref Rng & Units 10/13/2023    6:14 AM 10/11/2023    3:23 PM 09/11/2023    2:18 PM  CBC  WBC 4.0 - 10.5 K/uL 7.9  9.3  7.8   Hemoglobin 13.0 - 17.0 g/dL 7.3  8.6  40.9   Hematocrit 39.0 - 52.0 % 22.8  26.9  31.7   Platelets 150 - 400 K/uL 381  423  423    Inflammatory Markers C-reactive protein: 12.7 ESR: 121  Anemia Labs Iron: Iron 18, TIBC 195, saturation ratio 11%. Vitamin B12: 459 wnl. Folate pending. Reticulocytes pending.     Latest Ref Rng & Units 10/13/2023    6:14 AM 10/12/2023    5:40 PM 10/12/2023    2:15 AM  CMP  Glucose 70 - 99 mg/dL 96  94  811   BUN 8 - 23 mg/dL 22  25  29    Creatinine 0.61 - 1.24 mg/dL 9.14  7.82  9.56   Sodium 135 - 145 mmol/L 136  135  129   Potassium 3.5 - 5.1 mmol/L 4.3  4.4  4.2   Chloride 98 - 111 mmol/L 106  106  103   CO2 22 - 32 mmol/L 19  20  19    Calcium  8.9 - 10.3 mg/dL 8.6  8.6  8.1     Hyponatremia Labs Serum osmolality: 306, high. Serum osmolality gap calculated to 22 wnl. Osmolality, urine: 193. Sodium, urine: 41.  Micro UA: Cloudy, glucose > 500 mg/dL, Hgb dipstick small with 6-10 RBCs per HPF, protein 100, leukocytes large, bacteria many Ucx: Multiple species. Suggest re-collection. Bcx: No growth 2 days.  Imaging  TTE: - LV: Left ventricular ejection fraction 20-25% with severely decreased function, global hypokinesis, and mildly dilated cavity.  - RV: Right ventricular systolic function is mildly reduced with normal size. - LA: Moderately dilated.  - MV: The mitral valve is normal in structure. Moderate mitral valve regurgitation. No evidence of mitral stenosis. - Thrombus: LV thrombus at apex no longer visualized.   CT Renal: - New moderate bilateral hydronephrosis and hydroureter compared with the PET-CT of 09/10/2020.  - No clear cause for the ureteral obstruction proximal to the bladder identified. -  Diffuse parenchymal heterogeneity of the right kidney with associated soft tissue stranding, suspicious for pyelonephritis. - Circumferential bladder wall thickening with perivesical soft tissue stranding, suspicious for cystitis. - New small bilateral pleural effusions suspicious for worsening mild pulmonary edema.  Other Studies ABI/TBI Prior ABI/TBI performed 06/14/2023.  - R ABI 1.02 down from 1.07 previously - R TBI not applicable given amputation - L ABI 1.12 up from 1.05 previously - L TBI 0.44 down from 0.65 previously  Aortogram (performed 06/14/2023) without stenting or angioplasty - Right superficial femoral artery: patent; mild <50% stenosis in mid section  - Right pedal circulation: disadvantaged  - Plan: Small vessel disease in foot is likely culprit for foot infection. No opportunities for reducing major amputation risk.  Assessment/Plan:   Patient Summary: [name] is a 63 y.o. male with pertinent PMH of HFrEF (20-25% 10/2023) with left ventricular thrombus, prostate cancer s/p radiation 2022 with urinary incontinence, T2DM, HLD, chronic normocytic anemia, and homelessness who presented with tachycardia and is admitted for pyelonephritis and cystitis.  Active Hospital Problems  Pyelonephritis Cystitis Urinary overflow incontinence BPH Hx of prostate cancer s/p radiation CT and US  imaging consistent with pyelonephritis and cystitis, which are likely secondary to urinary retention from overflow incontinence s/p prostate radiation. He remains afebrile on a regimen of Rocephin  awaiting urine cultures that will need to be recollected due to multiple species present.  Plan: - Ceftriaxone  1 g daily while inpatient, day 2/5 - Repeat urine culture and reassess antibiotic coverage once species and susceptibilities return - CBC with differential - Tamsulosin  0.4 mg daily at bedtime   Sinus Tachycardia Pyelophlebitis is most likely the cause of patient's  tachycardia with has improved to 97-100 down from 120-130 on admission. Less likely due to hypovolemia with evidence of volume overload on POCUS.  Plan: -Will continue to treat primary causes of tachycardia  Normocytic anemia Hemoglobin 7.3 today down from 8.6 yesterday. Patient denied chest pain, SOB, and overt bleeding. Iron studies revealed saturation ratio 11%, ferritin level deferred in the setting of acute illness. B12 wnl, folate pending. Ganzoni equation calculates 1457 mg total iron deficit using target hemoglobin of 13.0. Plan:  - Folate pending - Reticulocytes pending - Defer oral iron in the setting of acute inflammation (poor absorption) and concern for GI blood loss (ambiguous dark stools) - Consider IV iron repletion after pyelonephritis and cystitis resolve - Hold Eliquis  for concern of blood loss  Biventricular HFrEF LVEF 20-25% 10/12/2023 Hx of LV thrombus 06/2023 Patient continues to deny SOB and orthopnea. Physical exam revealed bibasilar crackles, JVD to mid-neck, and no LE edema. TTE revealed left ventricular ejection fraction 20-25% consistent from prior study, but LV thrombus at apex no longer visualized. Patient was not taking  Eliquis  outpatient but was started on Eliquis  inpatient. Plan: - MR cardiac wo contrast to evaluate for thrombi - Holding Eliquis  as above for bleeding concerns  - SCDs  Chronic osteomyelitis of R foot L TBI 0.44 PAD  Repeat TBI revealed worsening L TBI 0.44. Patient was assessed with ABI/TBI and aortogram 06/2023. Vascular determined that small vessel disease was likely culprit for foot infection with no opportunities for reducing major amputation risk, i.e. no stenting or angioplasty performed. Plan: - Defer repeat aortogram given recent aortogram 06/2023 - Vascular outpatient evaluation - Consider ASA + Xarelto as preferred PAD regimen but defer in the setting of concern for bleeding   AKI, improving AKI improving with Cr downtrending  to 1.52 from 1.60 yesterday and 1.66 the day prior. - BMP tomorrow AM - Holding spiro, Lasix , and metformin  in the setting of AKI   Hyponatremia, resolved Na 136 today, up from 135 yesterday and 129 the day prior. Hyponatremia workup unremarkable. Continue to monitor and encourage PO intake. - BMP as above   Chronic Stable Problems  T2DM Nausea and Early Satiety c/f Diabetic Gastroparesis Patient reports some alleviation of postprandial nausea with metoclopramide .  - Metoclopramide  5 mg PRN - Moderate SSI with CBGs - Holding metformin  in setting of AKI  HLD - Lipitor  80 mg daily - Zetia  10mg  daily  Diet: Normal IVF: None VTE: SCDs while holding Eliquis  Code: Full PT/OT recs: Pending  Dispo: Anticipated discharge to transitional housing in 2-3 days pending antibiotic treatment and further workup.   Lynnea Satchel, MS3   Attestation for Student Documentation: I personally was present and performed or re-performed the history, physical exam and medical decision-making activities of this service and have verified that the service and findings are accurately documented in the student's note.  Aurora Lees, DO 10/13/2023, 3:06 PM

## 2023-10-13 NOTE — Progress Notes (Signed)
 ABI has been completed.    Results can be found under chart review under CV PROC. 10/13/2023 9:45 AM Angelis Gates RVT, RDMS

## 2023-10-13 NOTE — Evaluation (Signed)
 Occupational Therapy Evaluation Patient Details Name: Derek Thompson MRN: [id number] DOB: 01-12-1961 Today's Date: 10/13/2023   History of Present Illness   Pt is a 63 y/o M presenting to ED on 10/11/23 with tachycardia. Also with concern for urinary retention and AKI with Urology consulting. 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 Pt reports assistance with meal and states he does not drive. Pt now presents with decreased activity tolerance, increased fatigue, decreased balance, generalized B UE weakness, and decreased safety and independence with ADLs. Pt currently demonstrates ability to largely complete ADLs with Set up to Contact guard assist and functional mobility with a RW with Contact guard to Min assist. Pt participated well in session but was limited by fatigue. Pt will benefit from acute skilled OT services to address deficits outlined below and increase safety and independence with functional tasks. No post acute skilled OT needs are anticipated at this time.      If plan is discharge home, recommend the following:   A little help with walking and/or transfers;A little help with bathing/dressing/bathroom;Assistance with cooking/housework;Assist for transportation;Help with stairs or ramp for entrance     Functional Status Assessment   Patient has had a recent decline in their functional status and demonstrates the ability to make significant improvements in function in a reasonable and predictable amount of time.     Equipment Recommendations   Other (comment) (TBD based on pt progress)     Recommendations for Other  Services         Precautions/Restrictions   Precautions Precautions: Fall Restrictions Weight Bearing Restrictions Per Provider Order: No     Mobility Bed Mobility Overal bed mobility: Needs Assistance Bed Mobility: Supine to Sit, Sit to Supine     Supine to sit: Supervision Sit to supine: Supervision   General bed mobility comments: Supervision for safey    Transfers Overall transfer level: Needs assistance Equipment used: Rolling walker (2 wheels) Transfers: Sit to/from Stand, Bed to chair/wheelchair/BSC Sit to Stand: Contact guard assist     Step pivot transfers: Contact guard assist, Min assist     General transfer comment: Pt also side stepping x3 toward his Right at EOB with pt requiring CGA to Min assist for balance in side stepping and cues for safety/technique with RW.      Balance Overall balance assessment: Needs assistance Sitting-balance support: No upper extremity supported, Feet supported Sitting balance-Leahy Scale: Good     Standing balance support: Single extremity supported, Bilateral upper extremity supported, During functional activity, Reliant on assistive device for balance Standing balance-Leahy Scale: Poor                             ADL either performed or assessed with clinical judgement   ADL Overall ADL's : Needs assistance/impaired Eating/Feeding: Independent;Sitting   Grooming: Contact guard assist;Standing   Upper Body Bathing: Supervision/ safety;Set up;Sitting   Lower Body Bathing: Contact guard assist;Minimal assistance;Sitting/lateral leans;Sit to/from stand   Upper Body Dressing : Set up;Sitting   Lower Body Dressing: Supervision/safety;Contact guard assist;Sitting/lateral leans;Sit to/from stand   Toilet Transfer: Contact guard assist;Minimal assistance;Rolling  walker (2 wheels);BSC/3in1;Cueing for safety (step-pivot transfer; cues for hand placement/technique with RW) Toilet Transfer Details (indicate  cue type and reason): simulated at EOB Toileting- Clothing Manipulation and Hygiene: Supervision/safety;Contact guard assist;Sitting/lateral lean;Sit to/from stand       Functional mobility during ADLs:  (Pt declined further mobility this session due to fatigue) General ADL Comments: Pt with decreased activity tolerance, fatiguing quickly during tasks.     Vision Baseline Vision/History: 1 Wears glasses Ability to See in Adequate Light: 1 Impaired Patient Visual Report: No change from baseline       Perception         Praxis         Pertinent Vitals/Pain Pain Assessment Pain Assessment: Faces Faces Pain Scale: Hurts little more Pain Location: R foot with standing/steppint Pain Descriptors / Indicators: Aching, Discomfort, Sore Pain Intervention(s): Limited activity within patient's tolerance, Monitored during session, Repositioned     Extremity/Trunk Assessment Upper Extremity Assessment Upper Extremity Assessment: Left hand dominant;Generalized weakness;RUE deficits/detail;LUE deficits/detail RUE Deficits / Details: generalized weakness; mildly decreased AROM shoulder flexion but WFL LUE Deficits / Details: generalized weakness   Lower Extremity Assessment Lower Extremity Assessment: Defer to PT evaluation       Communication Communication Communication: No apparent difficulties   Cognition Arousal: Alert Behavior During Therapy: WFL for tasks assessed/performed Cognition: No apparent impairments             OT - Cognition Comments: Pt AAOx4 and pleasant throughout sesion. Pt with limited health literacy, which may affect his overall ability to manage his chronic health conditions.                 Following commands: Intact       Cueing  General Comments   Cueing Techniques: Verbal cues  Pt noted to have darkened and open areas on skin of RIght foot with RN aware.   Exercises     Shoulder Instructions      Home Living Family/patient  expects to be discharged to:: Shelter/Homeless                                 Additional Comments: Pt has a history of housing insecurity. Pt reports he has been living at [facility] and plans to return there after acute discharge.      Prior Functioning/Environment Prior Level of Function : Independent/Modified Independent;Needs assist             Mobility Comments: Pt reports he was performing funcitonal mobility Independent to Mod I with intermittent use of cane or manual w/c just prior to this admission. ADLs Comments: Pt reports he was completing ADLs Independent to Mod I just prior this admission and was receiving assistance with meals. Pt reports he does not drive due to impaired vision at baseline.    OT Problem List: Decreased strength;Decreased activity tolerance;Impaired balance (sitting and/or standing);Decreased knowledge of use of DME or AE   OT Treatment/Interventions: Self-care/ADL training;Therapeutic exercise;Energy conservation;DME and/or AE instruction;Therapeutic activities;Patient/family education;Balance training      OT Goals(Current goals can be found in the care plan section)   Acute Rehab OT Goals Patient Stated Goal: to feel better OT Goal Formulation: With patient Time For Goal Achievement: 10/27/23 Potential to Achieve Goals: Good ADL Goals Pt Will Perform Grooming: with modified independence;standing Pt Will Perform Lower Body Bathing: with modified independence;sitting/lateral leans;sit to/from stand Pt Will Perform Lower Body Dressing: with modified independence;sitting/lateral leans;sit  to/from stand Pt Will Transfer to Toilet: with modified independence;ambulating;regular height toilet (with least restrictive AD) Pt Will Perform Toileting - Clothing Manipulation and hygiene: with modified independence;sitting/lateral leans;sit to/from stand Pt/caregiver will Perform Home Exercise Program: Increased strength;Both right and left  upper extremity;With theraband;With theraputty;Independently;With written HEP provided Additional ADL Goal #1: Patient will demonstrate ability to Independently state 3 energy conservation strategies to increase safety and independence with functional tasks.   OT Frequency:  Min 2X/week    Co-evaluation              AM-PAC OT 6 Clicks Daily Activity     Outcome Measure Help from another person eating meals?: None Help from another person taking care of personal grooming?: A Little Help from another person toileting, which includes using toliet, bedpan, or urinal?: A Little Help from another person bathing (including washing, rinsing, drying)?: A Little Help from another person to put on and taking off regular upper body clothing?: A Little Help from another person to put on and taking off regular lower body clothing?: A Little 6 Click Score: 19   End of Session Equipment Utilized During Treatment: Rolling walker (2 wheels);Gait belt Nurse Communication: Mobility status;Other (comment) (Pt with darkened and open areas on skin of Right foot with RN aware.)  Activity Tolerance: Patient tolerated treatment well;Patient limited by fatigue Patient left: in bed;with call bell/phone within reach;with bed alarm set  OT Visit Diagnosis: Unsteadiness on feet (R26.81);Other abnormalities of gait and mobility (R26.89);Muscle weakness (generalized) (M62.81);Other (comment) (decreased activity tolerance)                Time: 1125-1140 OT Time Calculation (min): 15 min Charges:  OT General Charges $OT Visit: 1 Visit OT Evaluation $OT Eval Low Complexity: 1 Low  Ronnell Coins., OTR/L, MA Acute Rehab [phone]   Walt Gunner 10/13/2023, 2:03 PM

## 2023-10-14 DIAGNOSIS — N12 Tubulo-interstitial nephritis, not specified as acute or chronic: Secondary | ICD-10-CM

## 2023-10-14 DIAGNOSIS — N3000 Acute cystitis without hematuria: Secondary | ICD-10-CM

## 2023-10-14 DIAGNOSIS — Z8546 Personal history of malignant neoplasm of prostate: Secondary | ICD-10-CM

## 2023-10-14 DIAGNOSIS — D649 Anemia, unspecified: Secondary | ICD-10-CM

## 2023-10-14 DIAGNOSIS — I5082 Biventricular heart failure: Secondary | ICD-10-CM

## 2023-10-14 DIAGNOSIS — N39490 Overflow incontinence: Secondary | ICD-10-CM

## 2023-10-14 DIAGNOSIS — N401 Enlarged prostate with lower urinary tract symptoms: Secondary | ICD-10-CM

## 2023-10-14 LAB — BASIC METABOLIC PANEL WITH GFR
Anion gap: 9 (ref 5–15)
BUN: 23 mg/dL (ref 8–23)
CO2: 19 mmol/L — ABNORMAL LOW (ref 22–32)
Calcium: 8.4 mg/dL — ABNORMAL LOW (ref 8.9–10.3)
Chloride: 106 mmol/L (ref 98–111)
Creatinine, Ser: 1.48 mg/dL — ABNORMAL HIGH (ref 0.61–1.24)
GFR, Estimated: 53 mL/min — ABNORMAL LOW (ref >60)
Glucose, Bld: 136 mg/dL — ABNORMAL HIGH (ref 70–99)
Potassium: 4.4 mmol/L (ref 3.5–5.1)
Sodium: 134 mmol/L — ABNORMAL LOW (ref 135–145)

## 2023-10-14 LAB — URINE CULTURE: Culture: NO GROWTH

## 2023-10-14 LAB — CBC
HCT: 22.8 % — ABNORMAL LOW (ref 39.0–52.0)
Hemoglobin: 7.2 g/dL — ABNORMAL LOW (ref 13.0–17.0)
MCH: 28.3 pg (ref 26.0–34.0)
MCHC: 31.6 g/dL (ref 30.0–36.0)
MCV: 89.8 fL (ref 80.0–100.0)
Platelets: 373 10*3/uL (ref 150–400)
RBC: 2.54 MIL/uL — ABNORMAL LOW (ref 4.22–5.81)
RDW: 15.1 % (ref 11.5–15.5)
WBC: 7.3 10*3/uL (ref 4.0–10.5)
nRBC: 0 % (ref 0.0–0.2)

## 2023-10-14 LAB — GLUCOSE, CAPILLARY
Glucose-Capillary: 219 mg/dL — ABNORMAL HIGH (ref 70–99)
Glucose-Capillary: 149 mg/dL — ABNORMAL HIGH (ref 70–99)
Glucose-Capillary: 208 mg/dL — ABNORMAL HIGH (ref 70–99)
Glucose-Capillary: 169 mg/dL — ABNORMAL HIGH (ref 70–99)

## 2023-10-14 NOTE — Progress Notes (Signed)
 HD#1 Subjective:   Summary: Derek Thompson is a 63 y.o. 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   Overnight Events: None.  Derek Thompson reported sleeping well overnight to the point of having pleasant dreams. He shared that he had no particular concerns about his condition or treatment. On focused ROS, he denied shortness of breath, visible bleeding, and abdominal pain.  Patient shared that he felt unsure of his understanding of our plans but wanted to hear them regardless. He was comfortable with this as he is a person of faith and trusts his god to guide our hands. We explained his cardiac MR findings and our plan to continue his antibiotic regimen until urine cultures return. We shared that we would plan to discuss blood transfusion with our attending. He expressed some apprehension about the possibility of transfusion but was reassured by our discussion of risks/benefits.  Finally, patient expressed frustration at navigating multiple outpatient appointments without being able to drive given his visual impairment. We validated this frustration.   Objective:  Vital signs in last 24 hours: Vitals:   10/13/23 1940 10/13/23 2156 10/14/23 0225 10/14/23 0551  BP: 110/74 109/68 112/68 109/66  Pulse: (!) 113 (!) 114 (!) 111 (!) 103  Temp: 99.3 F (37.4 C) 99.2 F (37.3 C) 99.2 F (37.3 C) 98 F (36.7 C)  Resp: 18 18 18 18   Height:      Weight:      SpO2: 96% 95% 98% 97%  TempSrc:      BMI (Calculated):       Supplemental O2: Room Air SpO2: 97 %  Physical Exam:  Constitutional: Well-appearing, in no acute distress. CV: Tachycardic, regular rhythm. No lower extremity edema appreciated bilaterally. Pulm: Normal work of  breathing on room air. Lungs CTAB with no bibasilar crackles appreciated. Abdominal: No suprapubic fullness or tenderness to palpation, stable from yesterday. GU: Foley catheter in place with yellow urine.  Extremities: Feet and lower legs warm symmetrically. R foot necrotic ulceration stable. Neurological: Alert, responding and asking questions appropriately. Psych: Overall pleasant mood and affect, intermittently anxious, frustrated, and tearful.  Pertinent Labs:    Latest Ref Rng & Units 10/14/2023    6:34 AM 10/13/2023    6:14 AM 10/11/2023    3:23 PM  CBC  WBC 4.0 - 10.5 K/uL 7.3  7.9  9.3   Hemoglobin 13.0 - 17.0 g/dL 7.2  7.3  8.6   Hematocrit 39.0 - 52.0 % 22.8  22.8  26.9   Platelets 150 - 400 K/uL 373  381  423    Anemia Labs Iron: Iron 18, TIBC 195, saturation ratio 11%. Vitamin B12: 459 wnl. Folate: 8.5 wnl. Reticulocytes 1.3% with immature retic fraction 18.1%. RPI calculated to 0.4 (< 2 is inadequate response).     Latest Ref Rng & Units 10/14/2023    6:34 AM 10/13/2023    6:14 AM 10/12/2023    5:40 PM  CMP  Glucose 70 - 99 mg/dL 161  96  94   BUN 8 - 23 mg/dL 23  22  25    Creatinine 0.61 - 1.24 mg/dL 0.96  0.45  4.09   Sodium 135 - 145 mmol/L 134  136  135   Potassium 3.5 - 5.1 mmol/L 4.4  4.3  4.4  Chloride 98 - 111 mmol/L 106  106  106   CO2 22 - 32 mmol/L 19  19  20    Calcium  8.9 - 10.3 mg/dL 8.4  8.6  8.6    Sodium corrected for hyperglycemia: 135   Micro  UA: Turbid, large Hgb dipstick with > 50 RBCs, protein 100, leukocytes moderate with > 50 WBC, bacteria many Ucx: Re-collection pending. Bcx: No growth 3 days.  Imaging  MR Cardiac wo contrast (10/14/23): LV: Moderate LV dilatation with severe systolic dysfunction (EF 24%). Global hypokinesis with septal dyskinesis consistent with LBBB. RV: Normal RV size with moderate systolic dysfunction (EF 35%). MV: Mild mitral regurgitation (regurgitant fraction 18%)  Mixed cardiomyopathy, with evidence of  ischemic and nonischemic etiologies. There is basal septal midwall LGE, which is a scar pattern seen in nonischemic cardiomyopathies and associated with worse prognosis. There is also subendocardial LGE in apical inferolateral wall, consistent with prior infarct. In addition, there is RV insertion site LGE, which is often seen in setting of elevated pulmonary pressures.   Large LV thrombus seen on prior echo 06/2023 appears resolved. However, as LVEF remains severely reduced, there is high risk for LV thrombus recurrence and recommend continuing anticoagulation.  TTE (10/13/23): - LV: Left ventricular ejection fraction 20-25% with severely decreased function, global hypokinesis, and mildly dilated cavity.  - RV: Right ventricular systolic function is mildly reduced with normal size. - LA: Moderately dilated.  - MV: The mitral valve is normal in structure. Moderate mitral valve regurgitation. No evidence of mitral stenosis. - Thrombus: LV thrombus at apex no longer visualized.   Assessment/Plan:   Patient Summary: Derek Thompson is a 63 y.o. 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  Active Hospital Problems  Pyelonephritis Cystitis Urinary overflow incontinence BPH Hx of prostate cancer s/p radiation CT and US  imaging consistent with pyelonephritis and cystitis, which are likely secondary to urinary retention from overflow incontinence s/p prostate radiation. He remains afebrile on a regimen of Rocephin  awaiting repeat urine cultures (needed to be re-collected due to multiple species present). Plan: - Ceftriaxone  1 g daily while inpatient, day 3/5 - Repeat urine culture pending and reassess antibiotic coverage - CBC with differential  tomorrow AM - Tamsulosin  0.4 mg daily at bedtime  - Urology outpatient follow-up to guide management of overflow incontinence and BPH  Sinus Tachycardia Patient's tachycardia 100-115 compared with 95-100 yesterday and 120-130 on admission. He has been on metoprolol  25 mg daily. Tachycardia is likely multifactorial. Pyelonephritis and cystitis likely large contributors. HFrEF LVE 24% also likely contributing with low stroke volume and compensatory tachycardia. Anemia also likely contributor. Less likely due to hypovolemia with evidence of volume overload on yesterday's exam and POCUS.  Plan: - Will continue to treat pyelonephritis and cystitis - See below for plan for anemia and HFrEF  Normocytic anemia Hemoglobin 7.2 today, stable from 7.3 yesterday down from 8.6 the day prior. Patient still denied chest pain, SOB, and overt bleeding. B12 and folate within normal limits. Reticulocytes 1.3 with RPI 0.4, indicating inadequate response. Ganzoni equation calculates 1457 mg total iron deficit using target hemoglobin of 13.0. 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 Physical exam revealed no bibasilar crackles and no LE edema. MR revealed left ventricular ejection fraction 24% consistent from prior study, but LV thrombus at apex no longer visualized. Patient was not taking Eliquis  outpatient and was started on Eliquis  inpatient. Plan: - Holding Eliquis  as above for bleeding concerns  - SCDs ordered - Metoprolol  succinate 25 mg daily  - Resume  spironolactone  25 mg daily - Consider resuming Farxiga  10 mg daily if normotensive on spironolactone  - Consider Lasix  20 mg daily PRN based on volume status - BMP tomorrow AM  AKI, improving vs. resolved AKI is improving with Cr 1.48 from 1.52 yesterday. Hard to determine baseline given recent Cr values ranging from 1.07-1.98 in 2025. - BMP tomorrow AM as above - Consider resuming spironolactone , Farxiga , and Lasix  as above  Hyponatremia, resolved Na 134 today, which corrects to 135 in the setting of mild hyperglycemia. Hyponatremia workup unremarkable. Continue to monitor and encourage PO intake. - BMP as above   Chronic Stable Problems  Chronic osteomyelitis of R foot L TBI 0.44 PAD  Repeat TBI yesterday revealed worsening L TBI 0.44. Vascular assessed with ABI/TBI and aortogram 06/2023 with no stenting or angioplasty then. Plan: - Defer repeat aortogram given recent aortogram 06/2023 - Vascular outpatient evaluation - Consider ASA and Xarelto as preferred PAD regimen but defer in the setting of concern for bleeding   T2DM Nausea and Early Satiety c/f Diabetic Gastroparesis Patient reports some alleviation of postprandial nausea with metoclopramide .  - Metoclopramide  5 mg PRN - Moderate SSI with CBGs - Holding metformin  500 mg in setting of resolving AKI and while on SSI inpatient  HLD - Lipitor  80 mg daily - Zetia  10mg  daily  Diet: Normal IVF: None VTE: SCDs while holding Eliquis  Code: Full PT/OT recs: Pending  Dispo: Anticipated discharge to transitional housing in 2-3 days pending antibiotic treatment and further workup.   Lynnea Satchel, MS3  Attestation for Student Documentation:  I personally was present and re-performed the history, physical exam and medical decision-making activities of this service and have verified that the service and findings are accurately documented in the student's note.  Cleven Dallas, DO 10/14/2023, 2:01 PM

## 2023-10-14 NOTE — Plan of Care (Signed)

## 2023-10-15 LAB — CBC WITH DIFFERENTIAL/PLATELET
Abs Immature Granulocytes: 0.04 10*3/uL (ref 0.00–0.07)
Basophils Absolute: 0 10*3/uL (ref 0.0–0.1)
Basophils Relative: 0 %
Eosinophils Absolute: 0.1 10*3/uL (ref 0.0–0.5)
Eosinophils Relative: 2 %
HCT: 25.3 % — ABNORMAL LOW (ref 39.0–52.0)
Hemoglobin: 7.9 g/dL — ABNORMAL LOW (ref 13.0–17.0)
Immature Granulocytes: 1 %
Lymphocytes Relative: 16 %
Lymphs Abs: 1.2 10*3/uL (ref 0.7–4.0)
MCH: 28.5 pg (ref 26.0–34.0)
MCHC: 31.2 g/dL (ref 30.0–36.0)
MCV: 91.3 fL (ref 80.0–100.0)
Monocytes Absolute: 0.8 10*3/uL (ref 0.1–1.0)
Monocytes Relative: 10 %
Neutro Abs: 5.6 10*3/uL (ref 1.7–7.7)
Neutrophils Relative %: 71 %
Platelets: 389 10*3/uL (ref 150–400)
RBC: 2.77 MIL/uL — ABNORMAL LOW (ref 4.22–5.81)
RDW: 14.8 % (ref 11.5–15.5)
WBC: 7.8 10*3/uL (ref 4.0–10.5)
nRBC: 0 % (ref 0.0–0.2)

## 2023-10-15 LAB — GLUCOSE, CAPILLARY
Glucose-Capillary: 168 mg/dL — ABNORMAL HIGH (ref 70–99)
Glucose-Capillary: 165 mg/dL — ABNORMAL HIGH (ref 70–99)
Glucose-Capillary: 151 mg/dL — ABNORMAL HIGH (ref 70–99)
Glucose-Capillary: 157 mg/dL — ABNORMAL HIGH (ref 70–99)
Glucose-Capillary: 212 mg/dL — ABNORMAL HIGH (ref 70–99)

## 2023-10-15 LAB — BASIC METABOLIC PANEL WITH GFR
Anion gap: 8 (ref 5–15)
BUN: 23 mg/dL (ref 8–23)
CO2: 20 mmol/L — ABNORMAL LOW (ref 22–32)
Calcium: 8.4 mg/dL — ABNORMAL LOW (ref 8.9–10.3)
Chloride: 106 mmol/L (ref 98–111)
Creatinine, Ser: 1.29 mg/dL — ABNORMAL HIGH (ref 0.61–1.24)
GFR, Estimated: >60 mL/min (ref >60)
Glucose, Bld: 176 mg/dL — ABNORMAL HIGH (ref 70–99)
Potassium: 4.5 mmol/L (ref 3.5–5.1)
Sodium: 134 mmol/L — ABNORMAL LOW (ref 135–145)

## 2023-10-15 MED ORDER — DAPAGLIFLOZIN PROPANEDIOL 10 MG PO TABS
10.0000 mg | ORAL_TABLET | Freq: Every day | ORAL | Status: DC
  Administered 2023-10-15 – 2023-10-16 (×2): 10 mg via ORAL
  Filled 2023-10-15 (×2): qty 1

## 2023-10-15 MED ORDER — SODIUM CHLORIDE 0.9 % IV SOLN
1.0000 g | INTRAVENOUS | Status: AC
  Administered 2023-10-15: 1 g via INTRAVENOUS
  Filled 2023-10-15: qty 10

## 2023-10-15 MED ORDER — APIXABAN 5 MG PO TABS
5.0000 mg | ORAL_TABLET | Freq: Two times a day (BID) | ORAL | Status: DC
  Administered 2023-10-15 – 2023-10-16 (×2): 5 mg via ORAL
  Filled 2023-10-15 (×2): qty 1

## 2023-10-15 MED ORDER — AMOXICILLIN-POT CLAVULANATE 875-125 MG PO TABS
1.0000 | ORAL_TABLET | Freq: Two times a day (BID) | ORAL | Status: DC
  Administered 2023-10-16: 1 via ORAL
  Filled 2023-10-15: qty 1

## 2023-10-15 NOTE — Progress Notes (Signed)
 Orthopedic Tech Progress Note Patient Details:  Derek Thompson 1960-06-06 [id number]  Patient ID: Derek Thompson, male   DOB: 03/21/1961, 63 y.o.   MRN: [id number] Reached out to RN via secure chat in regards to patients shoe size, awaiting response. Derek Thompson 10/15/2023, 12:27 PM

## 2023-10-15 NOTE — Progress Notes (Signed)
 HD#2 Subjective:   Summary: Derek Thompson is a 63 y.o. 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   Overnight Events: None.  Patient is doing well this morning without any new or worsening complaints.  Objective:  Vital signs in last 24 hours: Vitals:   10/14/23 0551 10/14/23 1546 10/14/23 2143 10/15/23 0453  BP: 109/66 117/66 118/71 106/64  Pulse: (!) 103 (!) 107 (!) 112 (!) 102  Temp: 98 F (36.7 C) 98.3 F (36.8 C) 98.8 F (37.1 C) 98.5 F (36.9 C)  Resp: 18 18 15 17   Height:      Weight:      SpO2: 97% 97% 100% 97%  TempSrc:      BMI (Calculated):       Supplemental O2: Room Air SpO2: 97 %  Physical Exam:  Constitutional: Well-appearing, in no acute distress. Pulm: Normal work of breathing on room air GU: Foley catheter in place with yellow urine.   Pertinent Labs:    Latest Ref Rng & Units 10/14/2023    6:34 AM 10/13/2023    6:14 AM 10/11/2023    3:23 PM  CBC  WBC 4.0 - 10.5 K/uL 7.3  7.9  9.3   Hemoglobin 13.0 - 17.0 g/dL 7.2  7.3  8.6   Hematocrit 39.0 - 52.0 % 22.8  22.8  26.9   Platelets 150 - 400 K/uL 373  381  423        Latest Ref Rng & Units 10/14/2023    6:34 AM 10/13/2023    6:14 AM 10/12/2023    5:40 PM  CMP  Glucose 70 - 99 mg/dL 401  96  94   BUN 8 - 23 mg/dL 23  22  25    Creatinine 0.61 - 1.24 mg/dL 0.27  2.53  6.64   Sodium 135 - 145 mmol/L 134  136  135   Potassium 3.5 - 5.1 mmol/L 4.4  4.3  4.4   Chloride 98 - 111 mmol/L 106  106  106   CO2 22 - 32 mmol/L 19  19  20    Calcium  8.9 - 10.3 mg/dL 8.4  8.6  8.6      Assessment/Plan:   Patient Summary: Derek Thompson is a 63 y.o. male with pertinent PMH of HFrEF (20-25% 10/2023) with hx of left ventricular thrombus  (06/2023), prostate cancer s/p radiation 2022 with urinary incontinence, CAD, T2DM, HLD, chronic normocytic anemia, and homelessness who presented with tachycardia and is admitted for pyelonephritis and cystitis.  Active Hospital Problems  Pyelonephritis Cystitis Urinary overflow incontinence BPH Hx of prostate cancer s/p radiation Continues to do well on current ceftriaxone  with improvement in tachycardia and no recurrence of suprapubic pain.  Re-collected urine culture shows no growth. Plan: - Ceftriaxone  1 g daily while inpatient, day 4/7, transition to Augmentin  tomorrow to complete 7-day course - Tamsulosin  0.4 mg daily at bedtime  - Urology outpatient follow-up to guide management of overflow incontinence and BPH  Sinus Tachycardia Improved and predominantly heart rate is in the 90s.   Plan: - Continue infectious treatment and metoprolol  succinate 25 mg daily  Normocytic anemia Hemoglobin stable at 7.2 yesterday, CBC has not been drawn this morning but still no clinical signs of bleeding and heart rate is improving.  Iron deficit  1257.   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   Plan: - As above resume Eliquis  if stable CBC this morning - SCDs ordered - Continue metoprolol  succinate 25 mg daily, spironolactone  25 mg daily, and Lasix  20 mg daily as needed - Resume Farxiga  10 mg daily if improved/stable creatinine on BMP this morning  AKI, improving vs. resolved BMP this morning pending with creatinine 1.48 yesterday.  Continue adequate urine output.  Hyponatremia, resolved  Chronic Stable Problems  Chronic osteomyelitis of R foot L TBI 0.44 PAD  Repeat TBI yesterday revealed worsening L TBI 0.44. Vascular assessed with ABI/TBI  and aortogram 06/2023 with no stenting or angioplasty then. Plan: - Defer repeat aortogram given recent aortogram 06/2023 - Vascular outpatient evaluation - Consider ASA and Xarelto as preferred PAD regimen but defer in the setting of concern for bleeding   T2DM Nausea and Early Satiety c/f Diabetic Gastroparesis Patient reports some alleviation of postprandial nausea with metoclopramide .  - Metoclopramide  5 mg PRN - Moderate SSI with CBGs - Holding metformin  500 mg in setting of resolving AKI and while on SSI inpatient  HLD - Lipitor  80 mg daily - Zetia  10mg  daily  Diet: Normal IVF: None VTE: SCDs while holding Eliquis  Code: Full PT/OT recs: Pending  Dispo: Anticipated discharge to transitional housing in 1-2 days pending stable hemoglobin if we are able to restart Eliquis .  Derek Dallas, DO Internal Medicine Resident, PGY-2 Please contact the on call pager at [phone] for any urgent or emergent needs. 11:04 AM 10/15/2023

## 2023-10-15 NOTE — Progress Notes (Signed)
 Orthopedic Tech Progress Note Patient Details:  Derek Thompson 1960/06/26 [id number]  Ortho Devices Type of Ortho Device: Postop shoe/boot Ortho Device/Splint Interventions: Ordered, Adjustment   Post Interventions Patient Tolerated: Well Instructions Provided: Care of device, Adjustment of device POS left at bedside per patient request. Derek Thompson 10/15/2023, 4:39 PM

## 2023-10-16 ENCOUNTER — Other Ambulatory Visit (HOSPITAL_COMMUNITY): Payer: Self-pay

## 2023-10-16 ENCOUNTER — Ambulatory Visit: Admitting: Licensed Clinical Social Worker

## 2023-10-16 ENCOUNTER — Telehealth (HOSPITAL_COMMUNITY): Payer: Self-pay | Admitting: Pharmacy Technician

## 2023-10-16 DIAGNOSIS — Z59819 Housing instability, housed unspecified: Secondary | ICD-10-CM

## 2023-10-16 LAB — CULTURE, BLOOD (ROUTINE X 2)
Culture: NO GROWTH
Culture: NO GROWTH

## 2023-10-16 LAB — CBC
HCT: 26 % — ABNORMAL LOW (ref 39.0–52.0)
Hemoglobin: 8.2 g/dL — ABNORMAL LOW (ref 13.0–17.0)
MCH: 28.1 pg (ref 26.0–34.0)
MCHC: 31.5 g/dL (ref 30.0–36.0)
MCV: 89 fL (ref 80.0–100.0)
Platelets: 418 10*3/uL — ABNORMAL HIGH (ref 150–400)
RBC: 2.92 MIL/uL — ABNORMAL LOW (ref 4.22–5.81)
RDW: 14.8 % (ref 11.5–15.5)
WBC: 8 10*3/uL (ref 4.0–10.5)
nRBC: 0 % (ref 0.0–0.2)

## 2023-10-16 LAB — BASIC METABOLIC PANEL WITH GFR
Anion gap: 8 (ref 5–15)
BUN: 23 mg/dL (ref 8–23)
CO2: 20 mmol/L — ABNORMAL LOW (ref 22–32)
Calcium: 8.6 mg/dL — ABNORMAL LOW (ref 8.9–10.3)
Chloride: 108 mmol/L (ref 98–111)
Creatinine, Ser: 1.39 mg/dL — ABNORMAL HIGH (ref 0.61–1.24)
GFR, Estimated: 57 mL/min — ABNORMAL LOW (ref >60)
Glucose, Bld: 150 mg/dL — ABNORMAL HIGH (ref 70–99)
Potassium: 4.6 mmol/L (ref 3.5–5.1)
Sodium: 136 mmol/L (ref 135–145)

## 2023-10-16 LAB — GLUCOSE, CAPILLARY
Glucose-Capillary: 163 mg/dL — ABNORMAL HIGH (ref 70–99)
Glucose-Capillary: 172 mg/dL — ABNORMAL HIGH (ref 70–99)

## 2023-10-16 MED ORDER — FERROUS SULFATE 325 (65 FE) MG PO TABS
325.0000 mg | ORAL_TABLET | Freq: Every day | ORAL | 0 refills | Status: DC
  Filled 2023-10-16: qty 30, 30d supply, fill #0

## 2023-10-16 MED ORDER — ASPIRIN 81 MG PO TBEC
81.0000 mg | DELAYED_RELEASE_TABLET | Freq: Every day | ORAL | 0 refills | Status: DC
  Filled 2023-10-16: qty 30, 30d supply, fill #0

## 2023-10-16 MED ORDER — ASPIRIN 81 MG PO TBEC
81.0000 mg | DELAYED_RELEASE_TABLET | Freq: Every day | ORAL | Status: DC
  Administered 2023-10-16: 81 mg via ORAL
  Filled 2023-10-16: qty 1

## 2023-10-16 MED ORDER — APIXABAN 5 MG PO TABS
5.0000 mg | ORAL_TABLET | Freq: Two times a day (BID) | ORAL | 0 refills | Status: DC
  Filled 2023-10-16: qty 60, 30d supply, fill #0

## 2023-10-16 MED ORDER — DAPAGLIFLOZIN PROPANEDIOL 10 MG PO TABS
10.0000 mg | ORAL_TABLET | Freq: Every day | ORAL | 0 refills | Status: DC
  Filled 2023-10-16: qty 30, 30d supply, fill #0

## 2023-10-16 MED ORDER — TAMSULOSIN HCL 0.4 MG PO CAPS
0.4000 mg | ORAL_CAPSULE | Freq: Every day | ORAL | 0 refills | Status: DC
  Filled 2023-10-16: qty 30, 30d supply, fill #0

## 2023-10-16 MED ORDER — FUROSEMIDE 20 MG PO TABS
20.0000 mg | ORAL_TABLET | Freq: Every day | ORAL | 11 refills | Status: DC | PRN
  Filled 2023-10-16: qty 30, 30d supply, fill #0

## 2023-10-16 MED ORDER — EZETIMIBE 10 MG PO TABS
10.0000 mg | ORAL_TABLET | Freq: Every day | ORAL | 0 refills | Status: DC
  Filled 2023-10-16: qty 30, 30d supply, fill #0

## 2023-10-16 MED ORDER — AMOXICILLIN-POT CLAVULANATE 875-125 MG PO TABS
1.0000 | ORAL_TABLET | Freq: Two times a day (BID) | ORAL | 0 refills | Status: DC
  Filled 2023-10-16: qty 5, 3d supply, fill #0

## 2023-10-16 MED ORDER — ATORVASTATIN CALCIUM 80 MG PO TABS
80.0000 mg | ORAL_TABLET | Freq: Every day | ORAL | 0 refills | Status: DC
  Filled 2023-10-16: qty 30, 30d supply, fill #0

## 2023-10-16 MED ORDER — METOPROLOL SUCCINATE ER 25 MG PO TB24
ORAL_TABLET | ORAL | 0 refills | Status: DC
  Filled 2023-10-16: qty 30, 30d supply, fill #0

## 2023-10-16 MED ORDER — SPIRONOLACTONE 25 MG PO TABS
25.0000 mg | ORAL_TABLET | Freq: Every day | ORAL | 0 refills | Status: DC
  Filled 2023-10-16: qty 30, 30d supply, fill #0

## 2023-10-16 NOTE — Plan of Care (Signed)
  Problem: Education: Goal: Ability to describe self-care measures that may prevent or decrease complications (Diabetes Survival Skills Education) will improve Outcome: Adequate for Discharge Goal: Individualized Educational Video(s) Outcome: Adequate for Discharge   Problem: Coping: Goal: Ability to adjust to condition or change in health will improve Outcome: Adequate for Discharge   Problem: Fluid Volume: Goal: Ability to maintain a balanced intake and output will improve Outcome: Adequate for Discharge   Problem: Health Behavior/Discharge Planning: Goal: Ability to identify and utilize available resources and services will improve Outcome: Adequate for Discharge Goal: Ability to manage health-related needs will improve Outcome: Adequate for Discharge   Problem: Metabolic: Goal: Ability to maintain appropriate glucose levels will improve Outcome: Adequate for Discharge   Problem: Nutritional: Goal: Maintenance of adequate nutrition will improve Outcome: Adequate for Discharge Goal: Progress toward achieving an optimal weight will improve Outcome: Adequate for Discharge   Problem: Skin Integrity: Goal: Risk for impaired skin integrity will decrease Outcome: Adequate for Discharge   Problem: Tissue Perfusion: Goal: Adequacy of tissue perfusion will improve Outcome: Adequate for Discharge   Problem: Education: Goal: Knowledge of General Education information will improve Description: Including pain rating scale, medication(s)/side effects and non-pharmacologic comfort measures Outcome: Adequate for Discharge   Problem: Health Behavior/Discharge Planning: Goal: Ability to manage health-related needs will improve Outcome: Adequate for Discharge   Problem: Clinical Measurements: Goal: Ability to maintain clinical measurements within normal limits will improve Outcome: Adequate for Discharge Goal: Will remain free from infection Outcome: Adequate for Discharge Goal:  Diagnostic test results will improve Outcome: Adequate for Discharge Goal: Respiratory complications will improve Outcome: Adequate for Discharge Goal: Cardiovascular complication will be avoided Outcome: Adequate for Discharge   Problem: Activity: Goal: Risk for activity intolerance will decrease Outcome: Adequate for Discharge   Problem: Nutrition: Goal: Adequate nutrition will be maintained Outcome: Adequate for Discharge   Problem: Coping: Goal: Level of anxiety will decrease Outcome: Adequate for Discharge   Problem: Elimination: Goal: Will not experience complications related to bowel motility Outcome: Adequate for Discharge Goal: Will not experience complications related to urinary retention Outcome: Adequate for Discharge   Problem: Pain Managment: Goal: General experience of comfort will improve and/or be controlled Outcome: Adequate for Discharge   Problem: Safety: Goal: Ability to remain free from injury will improve Outcome: Adequate for Discharge   Problem: Skin Integrity: Goal: Risk for impaired skin integrity will decrease Outcome: Adequate for Discharge   Problem: Acute Rehab OT Goals (only OT should resolve) Goal: Pt. Will Perform Grooming Outcome: Adequate for Discharge Goal: Pt. Will Perform Lower Body Bathing Outcome: Adequate for Discharge Goal: Pt. Will Perform Lower Body Dressing Outcome: Adequate for Discharge Goal: Pt. Will Transfer To Toilet Outcome: Adequate for Discharge Goal: Pt. Will Perform Toileting-Clothing Manipulation Outcome: Adequate for Discharge Goal: Pt/Caregiver Will Perform Home Exercise Program Outcome: Adequate for Discharge Goal: OT Additional ADL Goal #1 Outcome: Adequate for Discharge   Problem: Acute Rehab PT Goals(only PT should resolve) Goal: Pt Will Go Sit To Supine/Side Outcome: Adequate for Discharge Goal: Patient Will Transfer Sit To/From Stand Outcome: Adequate for Discharge Goal: Pt Will  Ambulate Outcome: Adequate for Discharge Goal: Pt/caregiver will Perform Home Exercise Program Outcome: Adequate for Discharge

## 2023-10-16 NOTE — Progress Notes (Addendum)
 Explained discharge instructions to patient. Reviewed follow up appointment and next medication administration times. Also reviewed education. Also educated on how to empty and care for his indwelling catheter. Patient verbalized having an understanding for instructions given. All belongings are in the patient's possession. Will pick up TOC meds on the way to the D/C lounge. IV removed.  No other needs verbalized. Will transport patient to discharge lounge.

## 2023-10-16 NOTE — Discharge Summary (Signed)
 Name: Derek Thompson MRN: [id number] DOB: 02-12-1961 63 y.o. PCP: Jearldine Mina, DO  Date of Admission: 10/11/2023  2:54 PM Date of Discharge: 10/16/2023 11:19 AM Attending Physician: Dr. 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   apixaban  5 MG Tabs tablet Commonly known as: ELIQUIS  Take 1 tablet (5 mg total) by mouth 2 (two) times daily.   aspirin  EC 81 MG tablet Take 1 tablet (81 mg total) by mouth daily. Swallow whole.   atorvastatin  80 MG tablet Commonly known as: LIPITOR  Take 1 tablet (80 mg total) by mouth daily.   Contour Next Test test strip Generic drug: glucose blood Use to check blood sugar up to 3 times daily as instructed   dapagliflozin  propanediol 10 MG Tabs tablet Commonly known as: Farxiga  Take 1 tablet (10 mg total) by mouth daily before breakfast.   ezetimibe  10 MG tablet Commonly known as: ZETIA  Take 1 tablet (10 mg total) by mouth daily.   ferrous sulfate  325 (65 FE) MG tablet Take 1 tablet (325 mg total) by mouth daily.   furosemide  20 MG tablet Commonly known as: Lasix  Take 1 tablet (20 mg total) by mouth daily as needed for fluid or  edema. Please take on tablet if your have shortness of breath, leg swelling, gain more than 3 pounds within one day or gain more than 5 pounds within one week.   metFORMIN  500 MG tablet Commonly known as: GLUCOPHAGE  Take 2 tablets (1,000 mg total) by mouth 2 (two) times daily with a meal.   metoprolol  succinate 25 MG 24 hr tablet Commonly known as: TOPROL -XL TAKE 1 TABLET BY MOUTH DAILY   multivitamin tablet Take 1 tablet by mouth daily.   nitroGLYCERIN  0.2 mg/hr patch Commonly known as: NITRODUR - Dosed in mg/24 hr Place 1 patch (0.2 mg total) onto the skin daily.   spironolactone  25 MG tablet Commonly known as: Aldactone  Take 1 tablet (25 mg total) by mouth daily.   tamsulosin  0.4 MG Caps capsule Commonly known as: FLOMAX  Take 1 capsule (0.4 mg total) by mouth daily after supper.        Disposition and follow-up:   Derek Thompson was discharged from [facility] in Stable condition.  At the hospital follow up visit please address:  1.  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  Labs / imaging needed at time of follow-up: CBC,BMP  3.  Pending labs/ test needing follow-up: N/A  4.  Medication Changes   ADDED  -Augmentin    -Aspirin  81 mg  daily  -Spironolactone  25 mg is not a new medication, patient was restarted on it this admission    MODIFIED  -Lasix  20mg  daily to 20 mg prn daily   Hospital Course by problem list: Pyelonephritis Cystitis Urinary overflow incontinence BPH Hx of prostate cancer s/p radiation CT and US  imaging consistent with pyelonephritis and cystitis, which are likely secondary to urinary retention from overflow incontinence s/p prostate radiation. He remained afebrile on rocephin  and was transitioned to Augmentin  with a planned 7 day course.   Sinus Tachycardia Patient's tachycardia 100-115 compared with 95-100 yesterday and 120-130 on admission. He has been on metoprolol  25 mg daily. Tachycardia is likely multifactorial. Pyelonephritis and cystitis likely large contributors. HFrEF LVE 24% also likely contributing with low stroke volume and compensatory tachycardia. Anemia also likely contributor. Less likely due to hypovolemia with evidence of volume overload on exam and POCUS.   Normocytic anemia Hemoglobin 8.2 on the day of discharge. Patient still denied chest pain, SOB, and overt bleeding. B12 and folate within normal limits. Reticulocytes 1.3 with RPI 0.4, indicating inadequate response. Ganzoni equation calculates 1457 mg total iron deficit using target hemoglobin of 13.0.  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 MR revealed left ventricular ejection fraction 24% consistent from prior study, but LV thrombus at apex no longer visualized. Patient was not taking Eliquis  outpatient and was started on Eliquis  inpatient due to increased risk of additional LV thrombus in the future.  AKI, improving vs. resolved Improved. Hard to determine baseline given recent Cr values ranging from 1.07-1.98 in 2025.  Hyponatremia: resolved to 136.  Chronic osteomyelitis of R foot L TBI 0.44 PAD   Repeat ABI revealed worsening L TBI 0.44. Vascular assessed with ABI/TBI and aortogram 06/2023 with no stenting or angioplasty then. Patient was started on Asprin inpatient and instructed to follow up with vascular surgery.    Discharge Subjective: Patient is feeling well today and ready to be discharged. We reviewed the medication changes and he is agreeable to continue the foley cathter and follow up with urology outpatient.   Discharge Exam:   Blood pressure 130/77, pulse 97, temperature 98.3 F (36.8 C), temperature source Oral, resp. rate 17, height 6' (1.829 m), weight 71.2 kg, SpO2 99%.  Constitutional:Well-appearing, NAD Cardiovascular: regular rate and rhythm Pulmonary/Chest: normal work of breathing on room air Neurological: alert & awake, participating in conversation  MSK: No pitting edema Skin: warm and dry Psych: Normal mood and affect  Pertinent Labs, Studies, and Procedures:     Latest Ref Rng & Units 10/16/2023    6:16 AM 10/15/2023    3:50 PM 10/14/2023    6:34 AM  CBC  WBC 4.0 - 10.5 K/uL 8.0  7.8  7.3   Hemoglobin 13.0 - 17.0 g/dL 8.2  7.9  7.2   Hematocrit 39.0 - 52.0 % 26.0  25.3  22.8   Platelets 150 - 400 K/uL 418  389  373        Latest Ref Rng & Units 10/16/2023    6:16 AM 10/15/2023    3:50 PM 10/14/2023    6:34 AM  CMP  Glucose 70 - 99 mg/dL 536  644  034   BUN 8 - 23 mg/dL 23  23  23    Creatinine 0.61 - 1.24 mg/dL 7.42  5.95  6.38   Sodium 135 - 145 mmol/L 136  134  134   Potassium 3.5 - 5.1 mmol/L 4.6  4.5  4.4   Chloride 98 - 111 mmol/L 108  106  106   CO2 22 - 32 mmol/L 20  20  19    Calcium  8.9 - 10.3 mg/dL 8.6  8.4  8.4     ECHOCARDIOGRAM COMPLETE Result Date: 10/12/2023    ECHOCARDIOGRAM REPORT   Patient Name:   NIMROD WENDT Date of Exam: 10/12/2023 Medical Rec #:  [id number]           Height:       72.0 in Accession #:    [id number]          Weight:       157.0 lb Date of Birth:  1960/09/20           BSA:          1.922 m Patient Age:     63 years            BP:           117/66 mmHg Patient Gender: M                   HR:           129 bpm. Exam Location:  Inpatient Procedure: 2D Echo, Intracardiac Opacification Agent, Cardiac Doppler and Color            Doppler (Both Spectral and Color Flow Doppler were utilized during            procedure). Indications:    CHF  History:        Patient has prior history of Echocardiogram examinations, most                 recent 06/14/2023. CHF, CAD; Risk Factors:Hypertension,                 Dyslipidemia and Former Smoker.  Sonographer:    Griselda Lederer Referring Phys: [id number] Cherylene Corrente IMPRESSIONS  1. Left ventricular ejection fraction, by estimation, is 20 to 25%. The left ventricle has severely decreased function. The left ventricle demonstrates global hypokinesis. The left ventricular internal cavity size was mildly dilated. Left ventricular diastolic function could not be evaluated.  2. Right ventricular systolic function is mildly reduced. The right ventricular size is normal.  3. Left atrial size was moderately dilated.  4. The mitral valve is normal in structure. Moderate mitral valve regurgitation. No evidence of mitral stenosis.  5. The aortic valve is tricuspid. Aortic valve regurgitation is not visualized. No aortic stenosis is present.  6. The inferior vena cava is dilated in size with <50% respiratory variability, suggesting right atrial pressure of 15 mmHg. Comparison(s): Changes from prior study are noted. LV thrombus at apex no longer visualized. FINDINGS  Left Ventricle: Left ventricular ejection fraction, by estimation, is 20 to 25%. The left ventricle has severely  decreased function. The left ventricle demonstrates global hypokinesis. Definity  contrast agent was given IV to delineate the left ventricular endocardial borders. The left ventricular internal cavity size was mildly dilated. There is no left ventricular hypertrophy. Left ventricular diastolic function could not be evaluated.  Right Ventricle: The right ventricular size is normal. No increase in right ventricular wall thickness. Right ventricular systolic function is mildly reduced. Left Atrium: Left atrial size was moderately dilated. Right Atrium: Right atrial size was normal in size. Pericardium: Trivial pericardial effusion is present. The pericardial effusion is localized near the right atrium. Mitral Valve: The mitral valve is normal in structure. Moderate mitral valve regurgitation. No evidence of mitral valve stenosis. Tricuspid Valve: The tricuspid valve is normal in structure. Tricuspid valve regurgitation is trivial. No evidence of tricuspid stenosis. Aortic Valve: The aortic valve is tricuspid. Aortic valve regurgitation is not visualized. No aortic stenosis is present. Pulmonic Valve: The pulmonic valve was not well visualized. Pulmonic valve regurgitation is not visualized. Aorta: The aortic root and ascending aorta are structurally normal, with no evidence of dilitation. Venous: The inferior vena cava is dilated in size with less than 50% respiratory variability, suggesting right atrial pressure of 15 mmHg. IAS/Shunts: The atrial septum is grossly normal.  LEFT VENTRICLE PLAX 2D LVIDd:         5.50 cm LVIDs:         4.80 cm LV PW:         0.80 cm LV IVS:        0.80 cm LVOT diam:     2.00 cm LVOT Area:     3.14 cm  LV Volumes (MOD) LV vol d, MOD A2C: 177.0 ml LV vol d, MOD A4C: 149.0 ml LV vol s, MOD A2C: 135.0 ml LV vol s, MOD A4C: 121.0 ml LV SV MOD A2C:     42.0 ml LV SV MOD A4C:     149.0 ml LV SV MOD BP:      37.9 ml RIGHT VENTRICLE             IVC RV Basal diam:  3.00 cm     IVC diam: 2.10 cm RV S prime:     11.20 cm/s TAPSE (M-mode): 1.4 cm LEFT ATRIUM             Index LA diam:        4.20 cm 2.18 cm/m LA Vol (A2C):   72.9 ml 37.92 ml/m LA Vol (A4C):   88.7 ml 46.14 ml/m LA Biplane Vol: 83.7 ml 43.54 ml/m   AORTA Ao Root diam: 2.90 cm Ao Asc diam:  2.80 cm MR Peak grad:    54.5 mmHg MR Mean grad:    39.0 mmHg     SHUNTS MR Vmax:         369.00 cm/s  Systemic Diam: 2.00 cm MR Vmean:        300.0 cm/s MR PISA:         0.57 cm MR PISA Eff ROA: 5 mm MR PISA Radius:  0.30 cm Sheryle Donning MD Electronically signed by Sheryle Donning MD Signature Date/Time: 10/12/2023/6:52:59 PM    Final    CT RENAL STONE STUDY Result Date: 10/12/2023 CLINICAL DATA:  Bilateral hydronephrosis on renal ultrasound. EXAM: CT ABDOMEN AND PELVIS WITHOUT CONTRAST TECHNIQUE: Multidetector CT imaging of the abdomen and pelvis was performed following the standard protocol without IV contrast. RADIATION DOSE REDUCTION: This exam was performed according to the departmental dose-optimization program which includes  automated exposure control, adjustment of the mA and/or kV according to patient size and/or use of iterative reconstruction technique. COMPARISON:  Renal ultrasound 10/12/2023 and 06/14/2023. Chest CTA 10/11/2023. Pylarify  PET-CT 09/10/2020. FINDINGS: Lower chest: New small bilateral pleural effusions with increased septal thickening at both lung bases, suspicious for worsening mild pulmonary edema. Aortic and coronary artery atherosclerosis noted. Hepatobiliary: The liver appears unremarkable as imaged in the noncontrast state. There is new high density within the gallbladder lumen, likely reflecting vicarious excretion of contrast. No evidence of gallstones, gallbladder wall thickening or biliary dilatation. Pancreas: Unremarkable. No pancreatic ductal dilatation or surrounding inflammatory changes. Spleen: Multiple calcified granulomas. No other focal abnormality or splenomegaly. Adrenals/Urinary Tract: Both adrenal glands appear normal. As seen on the recent studies, there is new moderate bilateral hydronephrosis and hydroureter compared with the PET-CT of 09/10/2020. There is retained collecting system contrast, left greater than right, related to the recent chest CTA. There is diffuse parenchymal heterogeneity of the right  kidney with associated soft tissue stranding, suspicious for pyelonephritis. No focal left renal abnormality identified. No evidence of retroperitoneal mass causing extrinsic ureteral compression. There is circumferential bladder wall thickening without apparent focal bladder mass. There is retained contrast within the urinary bladder which does not appear significantly distended. There is perivesical soft tissue stranding, especially anteriorly. Stomach/Bowel: No enteric contrast administered. The stomach appears unremarkable for its degree of distension. No evidence of bowel wall thickening, distention or surrounding inflammatory change. The appendix appears normal. Mildly prominent stool throughout the colon. Vascular/Lymphatic: There are no enlarged abdominal or pelvic lymph nodes. Incompletely visualized mildly prominent right inguinal lymph node measuring up to 1.2 cm short axis on image 91/3. Aortic and branch vessel atherosclerosis. Reproductive: Brachytherapy seeds noted within the prostate gland. The gland appears mildly enlarged. Other: Perivesical soft tissue stranding without focal fluid collection. The anterior abdominal wall is intact. No ascites or pneumoperitoneum. Musculoskeletal: No acute or significant osseous findings. IMPRESSION: 1. New moderate bilateral hydronephrosis and hydroureter compared with the PET-CT of 09/10/2020. There is retained contrast within the collecting systems, left greater than right, related to the recent chest CTA. No clear cause for the ureteral obstruction proximal to the bladder identified. 2. Diffuse parenchymal heterogeneity of the right kidney with associated soft tissue stranding, suspicious for pyelonephritis. 3. Circumferential bladder wall thickening with perivesical soft tissue stranding, suspicious for cystitis. 4. Interval brachytherapy treatment for prostate cancer. 5. New small bilateral pleural effusions with increased septal thickening at both lung  bases, suspicious for worsening mild pulmonary edema. 6. Incompletely visualized mildly prominent right inguinal lymph node, nonspecific. 7.  Aortic Atherosclerosis (ICD10-I70.0). Electronically Signed   By: Elmon Hagedorn M.D.   On: 10/12/2023 11:35   US  RENAL Result Date: 10/12/2023 CLINICAL DATA:  Hydronephrosis EXAM: RENAL / URINARY TRACT ULTRASOUND COMPLETE COMPARISON:  06/14/2023 ultrasound FINDINGS: Right Kidney: Renal measurements: 11.0 x 5.6 x 4.6 cm = volume: 147 mL. Echogenicity within normal limits. No mass. Moderate hydronephrosis. Left Kidney: Renal measurements: 10.5 x 4.5 x 4.9 cm = volume: 119 mL. Echogenicity within normal limits. Moderate hydronephrosis. No mass. Bladder: Thickening of the bladder wall at the bladder base. Other: Limited views of the left kidney due to patient positioning. Patient was asleep in would not wake up. IMPRESSION: 1. Moderate bilateral hydronephrosis. 2. Indeterminate thickening of the bladder wall. This could be due to debris or malignancy. Recommend correlation with cystoscopy. Electronically Signed   By: Rozell Cornet M.D.   On: 10/12/2023 03:14   CT Angio  Chest PE W/Cm &/Or Wo Cm Result Date: 10/11/2023 CLINICAL DATA:  Pulmonary embolism suspected, positive D-dimer, tachycardia EXAM: CT ANGIOGRAPHY CHEST WITH CONTRAST TECHNIQUE: Multidetector CT imaging of the chest was performed using the standard protocol during bolus administration of intravenous contrast. Multiplanar CT image reconstructions and MIPs were obtained to evaluate the vascular anatomy. RADIATION DOSE REDUCTION: This exam was performed according to the departmental dose-optimization program which includes automated exposure control, adjustment of the mA and/or kV according to patient size and/or use of iterative reconstruction technique. CONTRAST:  75mL OMNIPAQUE  IOHEXOL  350 MG/ML SOLN COMPARISON:  Chest radiograph 06/27/2023; CT chest 05/22/2020; PET/CT 07/11/2020 FINDINGS: Cardiovascular:  Negative for acute pulmonary embolism. Normal caliber thoracic aorta. Coronary artery and aortic atherosclerotic calcification. Mild cardiomegaly. Mediastinum/Nodes: Calcified mediastinal and hilar lymph nodes. Trachea and esophagus are unremarkable. Lungs/Pleura: Interlobular and peribronchial thickening greatest in the lower lobes. Patchy ground-glass opacities in the lower lobes. Findings favor edema. No focal consolidation, pleural effusion, or pneumothorax. Upper Abdomen: Partially visualized hazy stranding about the right kidney. Right hydronephrosis with possible thickening and hyperenhancement of the ureteral urothelium. Prominent left extrarenal pelvis. Musculoskeletal: No acute fracture. Review of the MIP images confirms the above findings. IMPRESSION: 1. Negative for acute pulmonary embolism. 2. Mild cardiomegaly with pulmonary edema. 3. Perinephric stranding and hydronephrosis in the right kidney. Findings are concerning for pyelonephritis and/or ureteral obstruction. Further evaluation with contrast-enhanced CT of the abdomen and pelvis is recommended. 4. Aortic Atherosclerosis (ICD10-I70.0). Electronically Signed   By: Rozell Cornet M.D.   On: 10/11/2023 23:30     Discharge Instructions: Discharge Instructions     (HEART FAILURE PATIENTS) Call MD:  Anytime you have any of the following symptoms: 1) 3 pound weight gain in 24 hours or 5 pounds in 1 week 2) shortness of breath, with or without a dry hacking cough 3) swelling in the hands, feet or stomach 4) if you have to sleep on extra pillows at night in order to breathe.   Complete by: As directed    Call MD for:  difficulty breathing, headache or visual disturbances   Complete by: As directed    Call MD for:  extreme fatigue   Complete by: As directed    Call MD for:  hives   Complete by: As directed    Call MD for:  persistant dizziness or light-headedness   Complete by: As directed    Call MD for:  persistant nausea and vomiting    Complete by: As directed    Call MD for:  redness, tenderness, or signs of infection (pain, swelling, redness, odor or green/yellow discharge around incision site)   Complete by: As directed    Call MD for:  severe uncontrolled pain   Complete by: As directed    Call MD for:  temperature >100.4   Complete by: As directed    Diet - low sodium heart healthy   Complete by: As directed    Discharge instructions   Complete by: As directed    You came to the hospital for a fast hear rate and you were diagnosed with an infection in your kidney and bladder.  We treated you with antibiotics.  *For your Kidney and bladder infection (pyelonephritis and cystitis) -We have started you on these following medications:  -Augmentin , this is an antibiotic: please take a dose this evening and then a dose every morning and evening  -You will be discharged home with a foley catheter, it is very important that you follow  up with urology (bladder doctor) within the next month: - [facility], [phone], [address] [place], [place], [name] [address]   *For your Blood clot in your heart and heart failure  -We have started you on these following medications:  -Eliquis  5 mg twice a day  -Lasix  20 mg, Please take one tablet if your have shortness of breath, leg swelling, gain more than 3 pounds within one day or gain more than 5 pounds within one week. -Continue to take spironolactone  25 mg daily    If you have bright red blood in your stool or dark black blood, please contact your pcp!  *Continue to follow up with the vascular surgeon, we have started you on aspirin  to help with the blood flow to your feet!  Please visit your family doctor on June 23rd at 2:45   If you have any questions or concerns please feel free to call: Internal medicine clinic at [phone]   If you have any of these following symptoms, please call us  or seek care at an emergency department: -Chest  Pain -Difficulty Breathing -Bright red blood or dark black stool -Syncope (passing out) -Drooping of face -Slurred speech -Sudden weakness in your leg or arm -Fever -Chills   We are glad that you are feeling better, it was a pleasure to care for you!  Aurora Lees DO   Increase activity slowly   Complete by: As directed    No wound care   Complete by: As directed        Signed: Aurora Lees DO [facility] - PGY1 10/16/2023, 11:19 AM    Please contact the pager at [phone].

## 2023-10-16 NOTE — TOC Transition Note (Signed)
 Transition of Care [facility]) - Discharge Note   Patient Details  Name: Derek Thompson MRN: [id number] Date of Birth: [birth year]  Transition [facility] Care [facility] CM/SW Contact:  Alisa App, RN Phone Number: 10/16/2023, 11:34 AM   Clinical Narrative:    Patient will DC to: Home Anticipated DC date: 10/16/2023 Family notified: yes Transport by: car     Tachycardia/ urinary retention/AKI   Per MD patient ready for DC today. RN, and patient notified of DC. Pt d/c with foley. Pt to f/u with the urologist on Friday.   Pt states lives @ [address]. States he will get friends to assist with transportation to provider appointments. Post hospital f/u noted on AVS. Pt without RX med concerns. Pt d/c to d/c lounge. D/C lounge to assist with transportation to home. RNCM will sign off for now as intervention is no longer needed. Please consult us  again if new needs arise.    Final next level of care: Home/Self Care Barriers to Discharge: No Barriers Identified   Patient Goals and CMS Choice Patient states their goals for this hospitalization and ongoing recovery are:: get better          Discharge Placement                       Discharge Plan and Services Additional resources added to the After Visit Summary for     Discharge Planning Services: CM Consult                                 Social Drivers of Health (SDOH) Interventions SDOH Screenings   Food Insecurity: No Food Insecurity (10/12/2023)  Housing: High Risk (10/12/2023)  Transportation Needs: Unmet Transportation Needs (10/12/2023)  Utilities: Not At Risk (10/12/2023)  Alcohol Screen: Low Risk  (08/19/2022)  Depression (PHQ2-9): Low Risk  (10/11/2023)  Financial Resource Strain: Low Risk  (08/19/2022)  Tobacco Use: Medium Risk (10/11/2023)     Readmission Risk Interventions     No data to display

## 2023-10-16 NOTE — Telephone Encounter (Signed)
 Patient Product/process development scientist completed.    The patient is insured through [facility]. Patient has [employer], may use a copay card, and/or apply for patient assistance if available.    Ran test claim for Eliquis  5 mg and the current 30 day co-pay is $146.90.  Ran test claim for Xarelto 20 mg and the current 30 day co-pay is $140.31  This test claim was processed through [employer]- copay amounts may vary at other pharmacies due to [employer], or as the patient moves through the different stages of their insurance plan.     Morgan Arab, CPHT Pharmacy Technician III Certified Patient Advocate [facility] Patient Advocate Team Direct Number: [phone]  Fax: [phone]

## 2023-10-16 NOTE — Progress Notes (Signed)
 Physical Therapy Treatment Patient Details Name: Derek Thompson MRN: [id number] DOB: 27-Feb-1961 Today's Date: 10/16/2023   History of Present Illness Pt is a 63 y/o M presenting to ED on 10/11/23 with tachycardia. Also with concern for urinary retention and AKI with Urology consulting. 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  Presents with flat demeanor, but pleasant throughout tx,  He declines HEP.  Pt is requiring decreased assistance mobilize.  He reports he has  his own routine at home.  Pt continues to benefit from lower extremity strengthening from hospital stay.  Educated on performing LE strengthening and continuing walking program.  Pt to d/c home with no PT follow up.      If plan is discharge home, recommend the following: A little help with walking and/or transfers;A little help with bathing/dressing/bathroom;Assist for transportation   Can travel by private vehicle        Equipment Recommendations  None recommended by PT    Recommendations for Other Services       Precautions / Restrictions Precautions Precautions: Fall Recall of Precautions/Restrictions: Intact Restrictions Weight Bearing Restrictions Per Provider Order: No     Mobility  Bed Mobility Overal bed mobility: Needs Assistance Bed Mobility: Supine to Sit, Sit to Supine     Supine to sit: HOB elevated, Used rails, Supervision     General bed mobility comments: increased time to complete, increased time to scoot to edge of bed.    Transfers Overall transfer level: Needs assistance Equipment used: Rolling walker (2 wheels) Transfers: Sit to/from Stand Sit to Stand: Supervision           General transfer comment: VC given for sequencing; increased time to complete.     Ambulation/Gait Ambulation/Gait assistance: Supervision Gait Distance (Feet): 250 Feet Assistive device: Rolling walker (2 wheels) Gait Pattern/deviations: Step-through pattern, Decreased stride length, Decreased step length - right, Decreased stance time - right, Decreased weight shift to left       General Gait Details: Pt ambulates with legs in significant external rotation, R>L with pt reporting this is baseline. Pt demonstrates reciprocal gait pattern with reduced cadence, reduced knee flexion, and midfoot strike.   Stairs             Wheelchair Mobility     Tilt Bed    Modified Rankin (Stroke Patients Only)       Balance Overall balance assessment: Needs assistance Sitting-balance support: No upper extremity supported, Feet supported Sitting balance-Leahy Scale: Good       Standing balance-Leahy Scale: Poor                              Communication Communication Communication: No apparent difficulties Factors Affecting Communication: Reduced clarity of speech  Cognition Arousal: Alert Behavior During Therapy: WFL for tasks assessed/performed   PT - Cognitive impairments: No apparent impairments                         Following commands: Intact      Cueing Cueing Techniques: Verbal cues, Visual cues  Exercises Other Exercises Other Exercises: Reports he has an exercise routine he does at home and politely declined to have HEP printed.  General Comments        Pertinent Vitals/Pain Pain Assessment Pain Assessment: Faces Faces Pain Scale: No hurt (denies pain in right foot today.)    Home Living                          Prior Function            PT Goals (current goals can now be found in the care plan section) Acute Rehab PT Goals Patient Stated Goal: to continue building strength in legs Potential to Achieve Goals: Good Progress towards PT goals: Progressing toward goals    Frequency    Min  1X/week      PT Plan      Co-evaluation              AM-PAC PT 6 Clicks Mobility   Outcome Measure  Help needed turning from your back to your side while in a flat bed without using bedrails?: None Help needed moving from lying on your back to sitting on the side of a flat bed without using bedrails?: None Help needed moving to and from a bed to a chair (including a wheelchair)?: None Help needed standing up from a chair using your arms (e.g., wheelchair or bedside chair)?: None Help needed to walk in hospital room?: None Help needed climbing 3-5 steps with a railing? : A Little 6 Click Score: 23    End of Session Equipment Utilized During Treatment: Gait belt Activity Tolerance: Patient tolerated treatment well Patient left: in chair;with chair alarm set Nurse Communication: Mobility status PT Visit Diagnosis: Unsteadiness on feet (R26.81);Muscle weakness (generalized) (M62.81);Pain Pain - Right/Left: Right Pain - part of body: Ankle and joints of foot     Time: 1059-1110 PT Time Calculation (min) (ACUTE ONLY): 11 min  Charges:    $Gait Training: 8-22 mins PT General Charges $$ ACUTE PT VISIT: 1 Visit                     Derek Thompson , PTA Acute Rehabilitation Services Office [phone]    Derek Thompson 10/16/2023, 11:17 AM

## 2023-10-16 NOTE — Discharge Instructions (Signed)
 You came to the hospital for a fast hear rate and you were diagnosed with an infection in your kidney and bladder.  We treated you with antibiotics.  *For your Kidney and bladder infection (pyelonephritis and cystitis) -We have started you on these following medications:  -Augmentin , this is an antibiotic: please take a dose this evening and then a dose every morning and evening  -You will be discharged home with a foley catheter, it is very important that you follow up with urology (bladder doctor) within the next month: - [facility], [phone], [address] [place], [place], [name] [address]   *For your Blood clot in your heart and heart failure  -We have started you on these following medications:  -Eliquis  5 mg twice a day  -Lasix  20 mg, Please take one tablet if your have shortness of breath, leg swelling, gain more than 3 pounds within one day or gain more than 5 pounds within one week. -Continue to take spironolactone  25 mg daily    If you have bright red blood in your stool or dark black blood, please contact your pcp!  *Continue to follow up with the vascular surgeon, we have started you on aspirin  to help with the blood flow to your feet!  Please visit your family doctor on June 23rd at 2:45   If you have any questions or concerns please feel free to call: Internal medicine clinic at [phone]   If you have any of these following symptoms, please call us  or seek care at an emergency department: -Chest Pain -Difficulty Breathing -Bright red blood or dark black stool -Syncope (passing out) -Drooping of face -Slurred speech -Sudden weakness in your leg or arm -Fever -Chills   We are glad that you are feeling better, it was a pleasure to care for you!  Aurora Lees DO

## 2023-10-16 NOTE — Progress Notes (Deleted)
 [facility] [place]  ASSESSMENT / PLAN: 63 y.o. male with *** - ***  CHIEF COMPLAINT: ***  HISTORY OF PRESENT ILLNESS: Derek Thompson is a 63 y.o. male with a PMH of HTN, MI, prostate cancer, and DMII who was admitted to [facility] yesterday with generalized weakness for 1 week.  He was suspected to have HHNK with AMS.    Upon admission he was found to meet SIRS criteria with gangrenous changes to the right second toe.  He was started on broad-spectrum antibiotics.  He was also found to have acute infarcts on MRI consistent with embolic etiology.  Echo demonstrated a large apical LV thrombus.  He was started on a heparin  drip today.   We were consulted for the patient's abnormal ABIs in the setting of right lower extremity gangrene.  The patient states he has had skin changes to his right first and second toes for about 2 months.  This has worsened over time.  He has not sought care for these wounds.  He denies any fevers or chills.  He endorses occasional drainage from his wounds.  He denies any previous tissue loss.  He denies any rest pain.  He endorses bilateral calf claudication between 20 to 50 yards of walking.   He is a former smoker and quit in 2019. Prior to this he smoked about 1ppd for 43 years.  10/17/23: ***  Past Medical History:  Diagnosis Date   Anemia    Diabetes mellitus without complication (HCC) 1987   Family history of breast cancer    Hypertension    Myocardial infarction [facility])    Prostate cancer [facility])     Past Surgical History:  Procedure Laterality Date   ABDOMINAL AORTOGRAM W/LOWER EXTREMITY N/A 06/16/2023   Procedure: ABDOMINAL AORTOGRAM W/LOWER EXTREMITY;  Surgeon: Carlene Che, MD;  Location: [facility] INVASIVE CV LAB;  Service: Cardiovascular;  Laterality: N/A;   AMPUTATION Right 06/21/2023   Procedure: RIGHT 2ND RAY AMPUTATION;  Surgeon: Timothy Ford, MD;  Location: [facility] OR;  Service: Orthopedics;  Laterality: Right;   DENTAL  SURGERY     RADIOACTIVE SEED IMPLANT N/A 02/04/2021   Procedure: RADIOACTIVE SEED IMPLANT/BRACHYTHERAPY IMPLANT;  Surgeon: Lahoma Pigg, MD;  Location: [facility] ORS;  Service: Urology;  Laterality: N/A;   RIGHT/LEFT HEART CATH AND CORONARY ANGIOGRAPHY N/A 05/25/2020   Procedure: RIGHT/LEFT HEART CATH AND CORONARY ANGIOGRAPHY;  Surgeon: Swaziland, Peter M, MD;  Location: [facility] INVASIVE CV LAB;  Service: Cardiovascular;  Laterality: N/A;   RIGHT/LEFT HEART CATH AND CORONARY ANGIOGRAPHY N/A 10/14/2022   Procedure: RIGHT/LEFT HEART CATH AND CORONARY ANGIOGRAPHY;  Surgeon: Mardell Shade, MD;  Location: [facility] INVASIVE CV LAB;  Service: Cardiovascular;  Laterality: N/A;   SPACE OAR INSTILLATION N/A 02/04/2021   Procedure: SPACE OAR INSTILLATION;  Surgeon: Lahoma Pigg, MD;  Location: [facility] ORS;  Service: Urology;  Laterality: N/A;    Family History  Problem Relation Age of Onset   Breast cancer Mother        dx 30s/40s, twice   Cancer Maternal Grandfather        unknown type, d. >50    Social History   Socioeconomic History   Marital status: Divorced    Spouse name: Not on file   Number of children: 3   Years of education: Not on file   Highest education level: Associate degree: academic program  Occupational History   Occupation: [employer]  Tobacco Use   Smoking status: Former  Current packs/day: 0.00    Average packs/day: 1 pack/day for 43.0 years (43.0 ttl pk-yrs)    Types: Cigarettes    Start date: 05/22/1974    Quit date: 05/22/2017    Years since quitting: 6.4   Smokeless tobacco: Never   Tobacco comments:    Quit 2019 about 1PPD  Vaping Use   Vaping status: Never Used  Substance and Sexual Activity   Alcohol use: Not Currently    Comment: last drink 2015   Drug use: Not Currently    Comment: none in 7 years   Sexual activity: Not Currently  Other Topics Concern   Not on file  Social History Narrative   3 sons   Social Drivers of Corporate investment banker Strain: Low Risk   (08/19/2022)   Overall Financial Resource Strain (CARDIA)    Difficulty of Paying Living Expenses: Not very hard  Food Insecurity: No Food Insecurity (10/12/2023)   Hunger Vital Sign    Worried About Running Out of Food in the Last Year: Never true    Ran Out of Food in the Last Year: Never true  Transportation Needs: Unmet Transportation Needs (10/12/2023)   PRAPARE - Administrator, Civil Service (Medical): Yes    Lack of Transportation (Non-Medical): No  Physical Activity: Not on file  Stress: Not on file  Social Connections: Not on file  Intimate Partner Violence: Not At Risk (10/12/2023)   Humiliation, Afraid, Rape, and Kick questionnaire    Fear of Current or Ex-Partner: No    Emotionally Abused: No    Physically Abused: No    Sexually Abused: No    No Known Allergies  Current Outpatient Medications  Medication Sig Dispense Refill   amoxicillin -clavulanate (AUGMENTIN ) 875-125 MG tablet Take 1 tablet by mouth every 12 (twelve) hours. 5 tablet 0   apixaban  (ELIQUIS ) 5 MG TABS tablet Take 1 tablet (5 mg total) by mouth 2 (two) times daily. 60 tablet 0   aspirin  EC 81 MG tablet Take 1 tablet (81 mg total) by mouth daily. Swallow whole. 30 tablet 0   atorvastatin  (LIPITOR ) 80 MG tablet Take 1 tablet (80 mg total) by mouth daily. 30 tablet 0   dapagliflozin  propanediol (FARXIGA ) 10 MG TABS tablet Take 1 tablet (10 mg total) by mouth daily before breakfast. 30 tablet 0   ezetimibe  (ZETIA ) 10 MG tablet Take 1 tablet (10 mg total) by mouth daily. 30 tablet 0   ferrous sulfate  325 (65 FE) MG tablet Take 1 tablet (325 mg total) by mouth daily. 30 tablet 0   furosemide  (LASIX ) 20 MG tablet Take 1 tablet (20 mg total) by mouth daily as needed for fluid or edema. Please take on tablet if your have shortness of breath, leg swelling, gain more than 3 pounds within one day or gain more than 5 pounds within one week. 30 tablet 11   glucose blood (CONTOUR NEXT TEST) test strip Use to  check blood sugar up to 3 times daily as instructed 100 each 11   metFORMIN  (GLUCOPHAGE ) 500 MG tablet Take 2 tablets (1,000 mg total) by mouth 2 (two) times daily with a meal. 120 tablet 11   metoprolol  succinate (TOPROL -XL) 25 MG 24 hr tablet TAKE 1 TABLET BY MOUTH DAILY 30 tablet 0   Multiple Vitamin (MULTIVITAMIN) tablet Take 1 tablet by mouth daily.     nitroGLYCERIN  (NITRODUR - DOSED IN MG/24 HR) 0.2 mg/hr patch Place 1 patch (0.2 mg total) onto the skin  daily. (Patient not taking: Reported on 10/12/2023) 30 patch 12   spironolactone  (ALDACTONE ) 25 MG tablet Take 1 tablet (25 mg total) by mouth daily. 30 tablet 0   tamsulosin  (FLOMAX ) 0.4 MG CAPS capsule Take 1 capsule (0.4 mg total) by mouth daily after supper. 30 capsule 0   No current facility-administered medications for this visit.    PHYSICAL EXAM There were no vitals filed for this visit.  Constitutional: *** appearing. *** distress. Appears *** nourished.  Neurologic: CN ***. *** focal findings. *** sensory loss. Psychiatric: *** Mood and affect symmetric and appropriate. Eyes: *** No icterus. No conjunctival pallor. Ears, nose, throat: *** mucous membranes moist. Midline trachea.  Cardiac: *** rate and rhythm.  Respiratory: *** unlabored. Abdominal: *** soft, non-tender, non-distended.  Peripheral vascular: *** Extremity: *** edema. *** cyanosis. *** pallor.  Skin: *** gangrene. *** ulceration.  Lymphatic: *** Stemmer's sign. *** palpable lymphadenopathy.    PERTINENT LABORATORY AND RADIOLOGIC DATA  Most recent CBC    Latest Ref Rng & Units 10/16/2023    6:16 AM 10/15/2023    3:50 PM 10/14/2023    6:34 AM  CBC  WBC 4.0 - 10.5 K/uL 8.0  7.8  7.3   Hemoglobin 13.0 - 17.0 g/dL 8.2  7.9  7.2   Hematocrit 39.0 - 52.0 % 26.0  25.3  22.8   Platelets 150 - 400 K/uL 418  389  373      Most recent CMP    Latest Ref Rng & Units 10/16/2023    6:16 AM 10/15/2023    3:50 PM 10/14/2023    6:34 AM  CMP  Glucose 70 - 99  mg/dL 161  096  045   BUN 8 - 23 mg/dL 23  23  23    Creatinine 0.61 - 1.24 mg/dL 4.09  8.11  9.14   Sodium 135 - 145 mmol/L 136  134  134   Potassium 3.5 - 5.1 mmol/L 4.6  4.5  4.4   Chloride 98 - 111 mmol/L 108  106  106   CO2 22 - 32 mmol/L 20  20  19    Calcium  8.9 - 10.3 mg/dL 8.6  8.4  8.4     Renal function Estimated Creatinine Clearance: 54.1 mL/min (A) (by C-G formula based on SCr of 1.39 mg/dL (H)).  Hgb A1c MFr Bld (%)  Date Value  09/02/2023 8.7 (H)    LDL Chol Calc (NIH)  Date Value Ref Range Status  11/22/2022 109 (H) 0 - 99 mg/dL Final   LDL Cholesterol  Date Value Ref Range Status  06/15/2023 49 0 - 99 mg/dL Final    Comment:           Total Cholesterol/HDL:CHD Risk Coronary Heart Disease Risk Table                     Men   Women  1/2 Average Risk   3.4   3.3  Average Risk       5.0   4.4  2 X Average Risk   9.6   7.1  3 X Average Risk  23.4   11.0        Use the calculated Patient Ratio above and the CHD Risk Table to determine the patient's CHD Risk.        ATP III CLASSIFICATION (LDL):  <100     mg/dL   Optimal  782-956  mg/dL   Near or Above  Optimal  130-159  mg/dL   Borderline  161-096  mg/dL   High  >045     mg/dL   Very High Performed at [facility], [address] [place], [name] [address]      +-------+-----------+-----------+------------+------------+  ABI/TBIToday's ABIToday's TBIPrevious ABIPrevious TBI  +-------+-----------+-----------+------------+------------+  Right 1.02       amputation 1.07        1.85          +-------+-----------+-----------+------------+------------+  Left  1.12       0.44       1.05        0.65          +-------+-----------+-----------+------------+------------+   Angiogram 06/16/23 personally reviewed. Mild SFA stenosis. No other large vessel disease appreciated. Small vessel disease noted in foot.   Derek Thompson. Edgardo Goodwill, MD [facility]  [facility] Phone Number: [phone] 10/16/2023 9:22 PM   Total time spent on preparing this encounter including chart review, data review, collecting history, examining the patient, and coordinating care: {tnhtimebilling:[id number]} {billinglist:27273}  Portions of this report may have been transcribed using voice recognition software.  Every effort has been made to ensure accuracy; however, inadvertent computerized transcription errors may still be present.

## 2023-10-16 NOTE — BH Specialist Note (Signed)
 [facility] contacted patient via telephone as chart reflects he is currently in the hospital and unable to make it to in-person appointment for today at 2:30 pm. [facility] attempt was unsuccessful and [facility] was unable to leave a VM.  [facility] will follow up with patient in the next 30 days.   Amie Bald, MSW, LCSW-A She/Her Behavioral Health Clinician [facility]  Internal Medicine Center

## 2023-10-17 ENCOUNTER — Ambulatory Visit: Attending: Vascular Surgery | Admitting: Vascular Surgery

## 2023-10-17 ENCOUNTER — Observation Stay (HOSPITAL_COMMUNITY): Admission: RE | Admit: 2023-10-17 | Source: Ambulatory Visit

## 2023-10-19 NOTE — Progress Notes (Signed)
Internal Medicine Clinic Attending  I was physically present during the key portions of the resident provided service and participated in the medical decision making of patient's management care. I reviewed pertinent patient test results.  The assessment, diagnosis, and plan were formulated together and I agree with the documentation in the resident's note.  Williams, Julie Anne, MD  

## 2023-10-23 ENCOUNTER — Encounter: Admitting: Student

## 2023-10-23 NOTE — Progress Notes (Deleted)
 CC: ***  HPI:  [name] is a 63 y.o. male living with a history stated below and presents today for ***. Please see problem based assessment and plan for additional details.  Past Medical History:  Diagnosis Date   Anemia    Diabetes mellitus without complication (HCC) 1987   Family history of breast cancer    Hypertension    Myocardial infarction [facility])    Prostate cancer [facility])     Current Outpatient Medications on File Prior to Visit  Medication Sig Dispense Refill   amoxicillin -clavulanate (AUGMENTIN ) 875-125 MG tablet Take 1 tablet by mouth every 12 (twelve) hours. 5 tablet 0   apixaban  (ELIQUIS ) 5 MG TABS tablet Take 1 tablet (5 mg total) by mouth 2 (two) times daily. 60 tablet 0   aspirin  EC 81 MG tablet Take 1 tablet (81 mg total) by mouth daily. Swallow whole. 30 tablet 0   atorvastatin  (LIPITOR ) 80 MG tablet Take 1 tablet (80 mg total) by mouth daily. 30 tablet 0   dapagliflozin  propanediol (FARXIGA ) 10 MG TABS tablet Take 1 tablet (10 mg total) by mouth daily before breakfast. 30 tablet 0   ezetimibe  (ZETIA ) 10 MG tablet Take 1 tablet (10 mg total) by mouth daily. 30 tablet 0   ferrous sulfate  325 (65 FE) MG tablet Take 1 tablet (325 mg total) by mouth daily. 30 tablet 0   furosemide  (LASIX ) 20 MG tablet Take 1 tablet (20 mg total) by mouth daily as needed for fluid or edema. Please take on tablet if your have shortness of breath, leg swelling, gain more than 3 pounds within one day or gain more than 5 pounds within one week. 30 tablet 11   glucose blood (CONTOUR NEXT TEST) test strip Use to check blood sugar up to 3 times daily as instructed 100 each 11   metFORMIN  (GLUCOPHAGE ) 500 MG tablet Take 2 tablets (1,000 mg total) by mouth 2 (two) times daily with a meal. 120 tablet 11   metoprolol  succinate (TOPROL -XL) 25 MG 24 hr tablet TAKE 1 TABLET BY MOUTH DAILY 30 tablet 0   Multiple Vitamin (MULTIVITAMIN) tablet Take 1 tablet by mouth daily.     nitroGLYCERIN   (NITRODUR - DOSED IN MG/24 HR) 0.2 mg/hr patch Place 1 patch (0.2 mg total) onto the skin daily. (Patient not taking: Reported on 10/12/2023) 30 patch 12   spironolactone  (ALDACTONE ) 25 MG tablet Take 1 tablet (25 mg total) by mouth daily. 30 tablet 0   tamsulosin  (FLOMAX ) 0.4 MG CAPS capsule Take 1 capsule (0.4 mg total) by mouth daily after supper. 30 capsule 0   No current facility-administered medications on file prior to visit.    Family History  Problem Relation Age of Onset   Breast cancer Mother        dx 30s/40s, twice   Cancer Maternal Grandfather        unknown type, d. >50    Social History   Socioeconomic History   Marital status: Divorced    Spouse name: Not on file   Number of children: 3   Years of education: Not on file   Highest education level: Associate degree: academic program  Occupational History   Occupation: Training and development officer  Tobacco Use   Smoking status: Former    Current packs/day: 0.00    Average packs/day: 1 pack/day for 43.0 years (43.0 ttl pk-yrs)    Types: Cigarettes    Start date: 05/22/1974    Quit date: 05/22/2017  Years since quitting: 6.4   Smokeless tobacco: Never   Tobacco comments:    Quit 2019 about 1PPD  Vaping Use   Vaping status: Never Used  Substance and Sexual Activity   Alcohol use: Not Currently    Comment: last drink 2015   Drug use: Not Currently    Comment: none in 7 years   Sexual activity: Not Currently  Other Topics Concern   Not on file  Social History Narrative   3 sons   Social Drivers of Corporate investment banker Strain: Low Risk  (08/19/2022)   Overall Financial Resource Strain (CARDIA)    Difficulty of Paying Living Expenses: Not very hard  Food Insecurity: No Food Insecurity (10/12/2023)   Hunger Vital Sign    Worried About Running Out of Food in the Last Year: Never true    Ran Out of Food in the Last Year: Never true  Transportation Needs: Unmet Transportation Needs (10/12/2023)   PRAPARE - Therapist, art (Medical): Yes    Lack of Transportation (Non-Medical): No  Physical Activity: Not on file  Stress: Not on file  Social Connections: Not on file  Intimate Partner Violence: Not At Risk (10/12/2023)   Humiliation, Afraid, Rape, and Kick questionnaire    Fear of Current or Ex-Partner: No    Emotionally Abused: No    Physically Abused: No    Sexually Abused: No    Review of Systems: ROS negative except for what is noted on the assessment and plan.  There were no vitals filed for this visit.  Physical Exam  Physical Exam: Constitutional: well-appearing *** sitting in ***, in no acute distress HENT: normocephalic atraumatic, mucous membranes moist Eyes: conjunctiva non-erythematous Neck: supple Cardiovascular: regular rate and rhythm, no m/r/g Pulmonary/Chest: normal work of breathing on room air, lungs clear to auscultation bilaterally Abdominal: soft, non-tender, non-distended MSK: *** Neurological: alert & oriented x 3, 5/5 strength in bilateral upper and lower extremities, normal gait Skin: warm and dry Psych: ***  Assessment & Plan:   No problem-specific Assessment & Plan notes found for this encounter.  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 -Augmentin    LV thrombus - restarted Eliquis  5 BID  Biven HFrEF -Lasix  20 mg PRN  PAD -started ASA 81 mg   LABS: CBC, BMP  Patient {GC/[id number]::discussed with,seen with} Dr. {[id number]::Tpoopjfd,Z. Hoffman,Mullen,Narendra,Vincent,Guilloud,Lau,Machen}  Ozell Nearing, D.O. [facility] Internal Medicine, PGY-2 Phone: [phone] Date 10/23/2023 Time 12:17 PM

## 2023-11-01 ENCOUNTER — Encounter: Payer: Self-pay | Admitting: Student

## 2023-11-02 ENCOUNTER — Emergency Department (HOSPITAL_COMMUNITY): Payer: MEDICAID

## 2023-11-02 ENCOUNTER — Other Ambulatory Visit: Payer: Self-pay

## 2023-11-02 ENCOUNTER — Inpatient Hospital Stay (HOSPITAL_COMMUNITY)
Admission: EM | Admit: 2023-11-02 | Discharge: 2024-01-09 | DRG: 853 | Disposition: A | Discharge status: Hospice | Payer: MEDICAID | Attending: Critical Care Medicine | Admitting: Critical Care Medicine

## 2023-11-02 ENCOUNTER — Encounter (HOSPITAL_COMMUNITY): Payer: Self-pay

## 2023-11-02 DIAGNOSIS — R000 Tachycardia, unspecified: Secondary | ICD-10-CM | POA: Diagnosis not present

## 2023-11-02 DIAGNOSIS — D638 Anemia in other chronic diseases classified elsewhere: Secondary | ICD-10-CM | POA: Diagnosis present

## 2023-11-02 DIAGNOSIS — R6521 Severe sepsis with septic shock: Secondary | ICD-10-CM | POA: Diagnosis present

## 2023-11-02 DIAGNOSIS — Z7982 Long term (current) use of aspirin: Secondary | ICD-10-CM

## 2023-11-02 DIAGNOSIS — R569 Unspecified convulsions: Secondary | ICD-10-CM | POA: Diagnosis present

## 2023-11-02 DIAGNOSIS — Z751 Person awaiting admission to adequate facility elsewhere: Secondary | ICD-10-CM

## 2023-11-02 DIAGNOSIS — Z8042 Family history of malignant neoplasm of prostate: Secondary | ICD-10-CM

## 2023-11-02 DIAGNOSIS — I6529 Occlusion and stenosis of unspecified carotid artery: Secondary | ICD-10-CM | POA: Diagnosis present

## 2023-11-02 DIAGNOSIS — T383X5A Adverse effect of insulin and oral hypoglycemic [antidiabetic] drugs, initial encounter: Secondary | ICD-10-CM | POA: Diagnosis not present

## 2023-11-02 DIAGNOSIS — D696 Thrombocytopenia, unspecified: Secondary | ICD-10-CM | POA: Diagnosis not present

## 2023-11-02 DIAGNOSIS — R531 Weakness: Secondary | ICD-10-CM | POA: Diagnosis not present

## 2023-11-02 DIAGNOSIS — N390 Urinary tract infection, site not specified: Secondary | ICD-10-CM | POA: Insufficient documentation

## 2023-11-02 DIAGNOSIS — I447 Left bundle-branch block, unspecified: Secondary | ICD-10-CM | POA: Diagnosis present

## 2023-11-02 DIAGNOSIS — Z8546 Personal history of malignant neoplasm of prostate: Secondary | ICD-10-CM

## 2023-11-02 DIAGNOSIS — Z1152 Encounter for screening for COVID-19: Secondary | ICD-10-CM

## 2023-11-02 DIAGNOSIS — D62 Acute posthemorrhagic anemia: Secondary | ICD-10-CM | POA: Diagnosis not present

## 2023-11-02 DIAGNOSIS — R54 Age-related physical debility: Secondary | ICD-10-CM | POA: Diagnosis present

## 2023-11-02 DIAGNOSIS — A419 Sepsis, unspecified organism: Secondary | ICD-10-CM | POA: Diagnosis present

## 2023-11-02 DIAGNOSIS — N400 Enlarged prostate without lower urinary tract symptoms: Secondary | ICD-10-CM | POA: Diagnosis present

## 2023-11-02 DIAGNOSIS — B3749 Other urogenital candidiasis: Secondary | ICD-10-CM | POA: Diagnosis present

## 2023-11-02 DIAGNOSIS — N19 Unspecified kidney failure: Secondary | ICD-10-CM | POA: Insufficient documentation

## 2023-11-02 DIAGNOSIS — E1165 Type 2 diabetes mellitus with hyperglycemia: Secondary | ICD-10-CM | POA: Diagnosis present

## 2023-11-02 DIAGNOSIS — Z66 Do not resuscitate: Secondary | ICD-10-CM | POA: Insufficient documentation

## 2023-11-02 DIAGNOSIS — I472 Ventricular tachycardia, unspecified: Secondary | ICD-10-CM | POA: Diagnosis not present

## 2023-11-02 DIAGNOSIS — C61 Malignant neoplasm of prostate: Secondary | ICD-10-CM | POA: Diagnosis present

## 2023-11-02 DIAGNOSIS — M86661 Other chronic osteomyelitis, right tibia and fibula: Secondary | ICD-10-CM | POA: Diagnosis present

## 2023-11-02 DIAGNOSIS — E1169 Type 2 diabetes mellitus with other specified complication: Secondary | ICD-10-CM | POA: Diagnosis present

## 2023-11-02 DIAGNOSIS — I5082 Biventricular heart failure: Secondary | ICD-10-CM | POA: Diagnosis present

## 2023-11-02 DIAGNOSIS — E43 Unspecified severe protein-calorie malnutrition: Secondary | ICD-10-CM | POA: Diagnosis present

## 2023-11-02 DIAGNOSIS — R338 Other retention of urine: Secondary | ICD-10-CM | POA: Diagnosis present

## 2023-11-02 DIAGNOSIS — I5020 Unspecified systolic (congestive) heart failure: Secondary | ICD-10-CM | POA: Diagnosis present

## 2023-11-02 DIAGNOSIS — Y842 Radiological procedure and radiotherapy as the cause of abnormal reaction of the patient, or of later complication, without mention of misadventure at the time of the procedure: Secondary | ICD-10-CM | POA: Diagnosis not present

## 2023-11-02 DIAGNOSIS — Z87891 Personal history of nicotine dependence: Secondary | ICD-10-CM

## 2023-11-02 DIAGNOSIS — E119 Type 2 diabetes mellitus without complications: Secondary | ICD-10-CM

## 2023-11-02 DIAGNOSIS — I96 Gangrene, not elsewhere classified: Secondary | ICD-10-CM | POA: Diagnosis present

## 2023-11-02 DIAGNOSIS — I428 Other cardiomyopathies: Secondary | ICD-10-CM | POA: Diagnosis present

## 2023-11-02 DIAGNOSIS — Z794 Long term (current) use of insulin: Secondary | ICD-10-CM

## 2023-11-02 DIAGNOSIS — I071 Rheumatic tricuspid insufficiency: Secondary | ICD-10-CM | POA: Diagnosis present

## 2023-11-02 DIAGNOSIS — I5022 Chronic systolic (congestive) heart failure: Secondary | ICD-10-CM | POA: Diagnosis present

## 2023-11-02 DIAGNOSIS — N320 Bladder-neck obstruction: Secondary | ICD-10-CM | POA: Diagnosis present

## 2023-11-02 DIAGNOSIS — M869 Osteomyelitis, unspecified: Secondary | ICD-10-CM | POA: Diagnosis present

## 2023-11-02 DIAGNOSIS — T83518A Infection and inflammatory reaction due to other urinary catheter, initial encounter: Secondary | ICD-10-CM | POA: Diagnosis not present

## 2023-11-02 DIAGNOSIS — R739 Hyperglycemia, unspecified: Secondary | ICD-10-CM

## 2023-11-02 DIAGNOSIS — R64 Cachexia: Secondary | ICD-10-CM | POA: Diagnosis present

## 2023-11-02 DIAGNOSIS — E785 Hyperlipidemia, unspecified: Secondary | ICD-10-CM | POA: Diagnosis present

## 2023-11-02 DIAGNOSIS — I471 Supraventricular tachycardia, unspecified: Secondary | ICD-10-CM | POA: Diagnosis not present

## 2023-11-02 DIAGNOSIS — E1152 Type 2 diabetes mellitus with diabetic peripheral angiopathy with gangrene: Secondary | ICD-10-CM | POA: Diagnosis present

## 2023-11-02 DIAGNOSIS — R319 Hematuria, unspecified: Secondary | ICD-10-CM | POA: Insufficient documentation

## 2023-11-02 DIAGNOSIS — I252 Old myocardial infarction: Secondary | ICD-10-CM

## 2023-11-02 DIAGNOSIS — N184 Chronic kidney disease, stage 4 (severe): Secondary | ICD-10-CM | POA: Diagnosis present

## 2023-11-02 DIAGNOSIS — D509 Iron deficiency anemia, unspecified: Secondary | ICD-10-CM | POA: Diagnosis present

## 2023-11-02 DIAGNOSIS — D472 Monoclonal gammopathy: Secondary | ICD-10-CM | POA: Insufficient documentation

## 2023-11-02 DIAGNOSIS — E861 Hypovolemia: Secondary | ICD-10-CM | POA: Diagnosis present

## 2023-11-02 DIAGNOSIS — R339 Retention of urine, unspecified: Secondary | ICD-10-CM | POA: Insufficient documentation

## 2023-11-02 DIAGNOSIS — N136 Pyonephrosis: Secondary | ICD-10-CM | POA: Diagnosis present

## 2023-11-02 DIAGNOSIS — I5023 Acute on chronic systolic (congestive) heart failure: Secondary | ICD-10-CM | POA: Diagnosis present

## 2023-11-02 DIAGNOSIS — E11621 Type 2 diabetes mellitus with foot ulcer: Secondary | ICD-10-CM | POA: Diagnosis present

## 2023-11-02 DIAGNOSIS — N179 Acute kidney failure, unspecified: Secondary | ICD-10-CM | POA: Diagnosis not present

## 2023-11-02 DIAGNOSIS — G928 Other toxic encephalopathy: Secondary | ICD-10-CM | POA: Diagnosis not present

## 2023-11-02 DIAGNOSIS — Z7409 Other reduced mobility: Secondary | ICD-10-CM | POA: Diagnosis present

## 2023-11-02 DIAGNOSIS — Z8249 Family history of ischemic heart disease and other diseases of the circulatory system: Secondary | ICD-10-CM

## 2023-11-02 DIAGNOSIS — Z5982 Transportation insecurity: Secondary | ICD-10-CM

## 2023-11-02 DIAGNOSIS — N3040 Irradiation cystitis without hematuria: Secondary | ICD-10-CM | POA: Diagnosis not present

## 2023-11-02 DIAGNOSIS — Z5901 Sheltered homelessness: Secondary | ICD-10-CM

## 2023-11-02 DIAGNOSIS — E1122 Type 2 diabetes mellitus with diabetic chronic kidney disease: Secondary | ICD-10-CM | POA: Diagnosis present

## 2023-11-02 DIAGNOSIS — E8809 Other disorders of plasma-protein metabolism, not elsewhere classified: Secondary | ICD-10-CM

## 2023-11-02 DIAGNOSIS — N170 Acute kidney failure with tubular necrosis: Secondary | ICD-10-CM | POA: Diagnosis present

## 2023-11-02 DIAGNOSIS — E1140 Type 2 diabetes mellitus with diabetic neuropathy, unspecified: Secondary | ICD-10-CM | POA: Diagnosis present

## 2023-11-02 DIAGNOSIS — R188 Other ascites: Secondary | ICD-10-CM | POA: Diagnosis present

## 2023-11-02 DIAGNOSIS — E440 Moderate protein-calorie malnutrition: Secondary | ICD-10-CM

## 2023-11-02 DIAGNOSIS — K7200 Acute and subacute hepatic failure without coma: Secondary | ICD-10-CM | POA: Diagnosis not present

## 2023-11-02 DIAGNOSIS — T380X5A Adverse effect of glucocorticoids and synthetic analogues, initial encounter: Secondary | ICD-10-CM | POA: Diagnosis present

## 2023-11-02 DIAGNOSIS — Z7901 Long term (current) use of anticoagulants: Secondary | ICD-10-CM

## 2023-11-02 DIAGNOSIS — B377 Candidal sepsis: Principal | ICD-10-CM | POA: Diagnosis present

## 2023-11-02 DIAGNOSIS — E860 Dehydration: Secondary | ICD-10-CM | POA: Diagnosis present

## 2023-11-02 DIAGNOSIS — B49 Unspecified mycosis: Secondary | ICD-10-CM

## 2023-11-02 DIAGNOSIS — E875 Hyperkalemia: Secondary | ICD-10-CM | POA: Diagnosis present

## 2023-11-02 DIAGNOSIS — D649 Anemia, unspecified: Secondary | ICD-10-CM | POA: Diagnosis present

## 2023-11-02 DIAGNOSIS — I5084 End stage heart failure: Secondary | ICD-10-CM | POA: Diagnosis present

## 2023-11-02 DIAGNOSIS — Z79899 Other long term (current) drug therapy: Secondary | ICD-10-CM

## 2023-11-02 DIAGNOSIS — Y846 Urinary catheterization as the cause of abnormal reaction of the patient, or of later complication, without mention of misadventure at the time of the procedure: Secondary | ICD-10-CM | POA: Diagnosis not present

## 2023-11-02 DIAGNOSIS — L97511 Non-pressure chronic ulcer of other part of right foot limited to breakdown of skin: Secondary | ICD-10-CM

## 2023-11-02 DIAGNOSIS — Z681 Body mass index (BMI) 19 or less, adult: Secondary | ICD-10-CM

## 2023-11-02 DIAGNOSIS — G9341 Metabolic encephalopathy: Secondary | ICD-10-CM | POA: Diagnosis present

## 2023-11-02 DIAGNOSIS — I2510 Atherosclerotic heart disease of native coronary artery without angina pectoris: Secondary | ICD-10-CM | POA: Diagnosis present

## 2023-11-02 DIAGNOSIS — D735 Infarction of spleen: Secondary | ICD-10-CM | POA: Diagnosis not present

## 2023-11-02 DIAGNOSIS — C9000 Multiple myeloma not having achieved remission: Secondary | ICD-10-CM | POA: Diagnosis present

## 2023-11-02 DIAGNOSIS — Z5971 Insufficient health insurance coverage: Secondary | ICD-10-CM

## 2023-11-02 DIAGNOSIS — F419 Anxiety disorder, unspecified: Secondary | ICD-10-CM | POA: Diagnosis present

## 2023-11-02 DIAGNOSIS — Z7984 Long term (current) use of oral hypoglycemic drugs: Secondary | ICD-10-CM

## 2023-11-02 DIAGNOSIS — N39490 Overflow incontinence: Secondary | ICD-10-CM | POA: Diagnosis present

## 2023-11-02 DIAGNOSIS — Z91148 Patient's other noncompliance with medication regimen for other reason: Secondary | ICD-10-CM

## 2023-11-02 DIAGNOSIS — E872 Acidosis, unspecified: Secondary | ICD-10-CM | POA: Diagnosis present

## 2023-11-02 DIAGNOSIS — E871 Hypo-osmolality and hyponatremia: Secondary | ICD-10-CM | POA: Diagnosis present

## 2023-11-02 DIAGNOSIS — Z923 Personal history of irradiation: Secondary | ICD-10-CM

## 2023-11-02 DIAGNOSIS — Z515 Encounter for palliative care: Secondary | ICD-10-CM

## 2023-11-02 DIAGNOSIS — I130 Hypertensive heart and chronic kidney disease with heart failure and stage 1 through stage 4 chronic kidney disease, or unspecified chronic kidney disease: Secondary | ICD-10-CM | POA: Diagnosis present

## 2023-11-02 DIAGNOSIS — M86671 Other chronic osteomyelitis, right ankle and foot: Secondary | ICD-10-CM | POA: Diagnosis present

## 2023-11-02 DIAGNOSIS — R570 Cardiogenic shock: Secondary | ICD-10-CM | POA: Diagnosis not present

## 2023-11-02 DIAGNOSIS — I240 Acute coronary thrombosis not resulting in myocardial infarction: Secondary | ICD-10-CM | POA: Diagnosis present

## 2023-11-02 DIAGNOSIS — T361X5A Adverse effect of cephalosporins and other beta-lactam antibiotics, initial encounter: Secondary | ICD-10-CM | POA: Diagnosis not present

## 2023-11-02 DIAGNOSIS — Z89511 Acquired absence of right leg below knee: Secondary | ICD-10-CM

## 2023-11-02 DIAGNOSIS — Z8673 Personal history of transient ischemic attack (TIA), and cerebral infarction without residual deficits: Secondary | ICD-10-CM

## 2023-11-02 DIAGNOSIS — Z803 Family history of malignant neoplasm of breast: Secondary | ICD-10-CM

## 2023-11-02 LAB — COMPREHENSIVE METABOLIC PANEL WITH GFR
ALT: 40 U/L (ref 0–44)
AST: 47 U/L — ABNORMAL HIGH (ref 15–41)
Albumin: 2.3 g/dL — ABNORMAL LOW (ref 3.5–5.0)
Alkaline Phosphatase: 56 U/L (ref 38–126)
Anion gap: 10 (ref 5–15)
BUN: 61 mg/dL — ABNORMAL HIGH (ref 8–23)
CO2: 17 mmol/L — ABNORMAL LOW (ref 22–32)
Calcium: 8.9 mg/dL (ref 8.9–10.3)
Chloride: 95 mmol/L — ABNORMAL LOW (ref 98–111)
Creatinine, Ser: 3.14 mg/dL — ABNORMAL HIGH (ref 0.61–1.24)
GFR, Estimated: 21 mL/min — ABNORMAL LOW (ref >60)
Glucose, Bld: 237 mg/dL — ABNORMAL HIGH (ref 70–99)
Potassium: 5.8 mmol/L — ABNORMAL HIGH (ref 3.5–5.1)
Sodium: 122 mmol/L — ABNORMAL LOW (ref 135–145)
Total Bilirubin: 0.8 mg/dL (ref 0.0–1.2)
Total Protein: 8.5 g/dL — ABNORMAL HIGH (ref 6.5–8.1)

## 2023-11-02 LAB — URINALYSIS, ROUTINE W REFLEX MICROSCOPIC
Bilirubin Urine: NEGATIVE
Glucose, UA: 50 mg/dL — AB
Ketones, ur: NEGATIVE mg/dL
Nitrite: NEGATIVE
Protein, ur: 100 mg/dL — AB
Specific Gravity, Urine: 1.01 (ref 1.005–1.030)
WBC, UA: >50 WBC/hpf (ref 0–5)
pH: 5 (ref 5.0–8.0)

## 2023-11-02 LAB — CBC
HCT: 26.3 % — ABNORMAL LOW (ref 39.0–52.0)
Hemoglobin: 8.3 g/dL — ABNORMAL LOW (ref 13.0–17.0)
MCH: 27.5 pg (ref 26.0–34.0)
MCHC: 31.6 g/dL (ref 30.0–36.0)
MCV: 87.1 fL (ref 80.0–100.0)
Platelets: 355 10*3/uL (ref 150–400)
RBC: 3.02 MIL/uL — ABNORMAL LOW (ref 4.22–5.81)
RDW: 16.3 % — ABNORMAL HIGH (ref 11.5–15.5)
WBC: 9.8 10*3/uL (ref 4.0–10.5)
nRBC: 0 % (ref 0.0–0.2)

## 2023-11-02 LAB — BASIC METABOLIC PANEL WITH GFR
Anion gap: 10 (ref 5–15)
BUN: 61 mg/dL — ABNORMAL HIGH (ref 8–23)
CO2: 19 mmol/L — ABNORMAL LOW (ref 22–32)
Calcium: 8.7 mg/dL — ABNORMAL LOW (ref 8.9–10.3)
Chloride: 97 mmol/L — ABNORMAL LOW (ref 98–111)
Creatinine, Ser: 2.99 mg/dL — ABNORMAL HIGH (ref 0.61–1.24)
GFR, Estimated: 23 mL/min — ABNORMAL LOW (ref >60)
Glucose, Bld: 237 mg/dL — ABNORMAL HIGH (ref 70–99)
Potassium: 5.2 mmol/L — ABNORMAL HIGH (ref 3.5–5.1)
Sodium: 126 mmol/L — ABNORMAL LOW (ref 135–145)

## 2023-11-02 LAB — CBG MONITORING, ED: Glucose-Capillary: 208 mg/dL — ABNORMAL HIGH (ref 70–99)

## 2023-11-02 LAB — SODIUM, URINE, RANDOM: Sodium, Ur: 66 mmol/L

## 2023-11-02 LAB — BRAIN NATRIURETIC PEPTIDE: B Natriuretic Peptide: 4136.5 pg/mL — ABNORMAL HIGH (ref 0.0–100.0)

## 2023-11-02 LAB — OSMOLALITY: Osmolality: 307 mosm/kg — ABNORMAL HIGH (ref 275–295)

## 2023-11-02 LAB — DIGOXIN LEVEL: Digoxin Level: <0.4 ng/mL — ABNORMAL LOW (ref 0.8–2.0)

## 2023-11-02 LAB — OSMOLALITY, URINE: Osmolality, Ur: 332 mosm/kg (ref 300–900)

## 2023-11-02 LAB — GLUCOSE, CAPILLARY: Glucose-Capillary: 221 mg/dL — ABNORMAL HIGH (ref 70–99)

## 2023-11-02 MED ORDER — SODIUM CHLORIDE 0.9 % IV BOLUS
1000.0000 mL | Freq: Once | INTRAVENOUS | Status: AC
  Administered 2023-11-02: 1000 mL via INTRAVENOUS

## 2023-11-02 MED ORDER — ASPIRIN 81 MG PO TBEC
81.0000 mg | DELAYED_RELEASE_TABLET | Freq: Every day | ORAL | Status: DC
Start: 2023-11-02 — End: 2023-12-29
  Administered 2023-11-02 – 2023-12-29 (×60): 81 mg via ORAL
  Filled 2023-11-02 (×58): qty 1

## 2023-11-02 MED ORDER — DEXTROSE 50 % IV SOLN
25.0000 g | Freq: Once | INTRAVENOUS | Status: AC
  Administered 2023-11-02: 25 g via INTRAVENOUS
  Filled 2023-11-02: qty 50

## 2023-11-02 MED ORDER — LACTATED RINGERS IV BOLUS
1000.0000 mL | Freq: Once | INTRAVENOUS | Status: AC
  Administered 2023-11-02: 1000 mL via INTRAVENOUS

## 2023-11-02 MED ORDER — EZETIMIBE 10 MG PO TABS
10.0000 mg | ORAL_TABLET | Freq: Every day | ORAL | Status: DC
  Administered 2023-11-02 – 2024-01-08 (×71): 10 mg via ORAL
  Filled 2023-11-02 (×69): qty 1

## 2023-11-02 MED ORDER — SODIUM ZIRCONIUM CYCLOSILICATE 10 G PO PACK
10.0000 g | PACK | Freq: Once | ORAL | Status: AC
  Administered 2023-11-02: 10 g via ORAL
  Filled 2023-11-02: qty 1

## 2023-11-02 MED ORDER — APIXABAN 2.5 MG PO TABS
2.5000 mg | ORAL_TABLET | Freq: Two times a day (BID) | ORAL | Status: DC
  Administered 2023-11-02 – 2023-11-03 (×3): 2.5 mg via ORAL
  Filled 2023-11-02 (×3): qty 1

## 2023-11-02 MED ORDER — INSULIN ASPART 100 UNIT/ML IJ SOLN
8.0000 [IU] | Freq: Once | INTRAMUSCULAR | Status: AC
  Administered 2023-11-02: 8 [IU] via INTRAVENOUS

## 2023-11-02 MED ORDER — TAMSULOSIN HCL 0.4 MG PO CAPS
0.4000 mg | ORAL_CAPSULE | Freq: Every day | ORAL | Status: DC
  Administered 2023-11-02 – 2023-12-25 (×54): 0.4 mg via ORAL
  Filled 2023-11-02 (×52): qty 1

## 2023-11-02 MED ORDER — SODIUM BICARBONATE 8.4 % IV SOLN
50.0000 meq | Freq: Once | INTRAVENOUS | Status: AC
  Administered 2023-11-02: 50 meq via INTRAVENOUS
  Filled 2023-11-02: qty 50

## 2023-11-02 NOTE — Consult Note (Signed)
 WOC Nurse Consult Note: patient with history of ischemic wounds to R foot; has  had R foot 2nd ray amputation by Dr. Harden 06/2023; had new ischemic ulcer develop after this to lateral foot; vascular has evaluated patient as well  Reason for Consult: wound R foot  Wound type: full thickness ischemic  Pressure Injury POA: NA not pressure  Measurement: see nursing flowsheet  Wound bed: amputation site with dry brown tissue; eschar to R lateral foot  Drainage (amount, consistency, odor) appears dry  Periwound: Dressing procedure/placement/frequency: Cleanse foot wounds with soap and water  and apply Xeroform gauze ([place] [phone]) to wound beds daily, cover with dry gauze and secure with Kerlix roll gauze.    Patient should follow with Dr. Harden as outpatient for ongoing monitoring and care of R foot.   POC discussed with bedside nurse. WOC team will not follow. Reconsult if further needs arise.   Thank you,    Powell Bar MSN, RN-BC, [employer]

## 2023-11-02 NOTE — Progress Notes (Signed)
 Asked to reevaluate the patient after handoff from ID team.  This is a patient who was laying in bed and appeared overall comfortable.  He was slightly depressed due to being back in the hospital but did not have any acute concerns.  Denies any shortness of breath, chest pain, abdominal pain, or suprapubic pain.  He has a condom cath on and had 50-100 mL of clear yellow urine in the catheter bag.  Presented with AKI, hyponatremia, and hyperkalemia that have all slightly improved on repeat BMP at 4 PM today.  Overall the patient appears stable and we will check a bladder scan tonight and consider placing a Foley catheter if his urine output is indeed overflow incontinence.  We will follow-up his morning BMP but hold off on any further hyperkalemia temporizing measures or IV fluids for hyponatremia. - Bladder scan tonight, place catheter if over 300 mL - Follow-up a.m. BMP  Fairy Pool, DO Internal Medicine Resident, PGY-3 Please contact the on call pager at [phone] for any urgent or emergent needs. 9:38 PM 11/02/2023

## 2023-11-02 NOTE — Plan of Care (Signed)
  Problem: Education: Goal: Knowledge of General Education information will improve Description: Including pain rating scale, medication(s)/side effects and non-pharmacologic comfort measures 11/02/2023 1703 by Shela Lapine, RN Outcome: Progressing 11/02/2023 1638 by Shela Lapine, RN Outcome: Progressing   Problem: Health Behavior/Discharge Planning: Goal: Ability to manage health-related needs will improve 11/02/2023 1703 by Shela Lapine, RN Outcome: Progressing 11/02/2023 1638 by Shela Lapine, RN Outcome: Progressing   Problem: Clinical Measurements: Goal: Ability to maintain clinical measurements within normal limits will improve 11/02/2023 1703 by Shela Lapine, RN Outcome: Progressing 11/02/2023 1638 by Shela Lapine, RN Outcome: Progressing Goal: Will remain free from infection 11/02/2023 1703 by Shela Lapine, RN Outcome: Progressing 11/02/2023 1638 by Shela Lapine, RN Outcome: Progressing Goal: Diagnostic test results will improve 11/02/2023 1703 by Shela Lapine, RN Outcome: Progressing 11/02/2023 1638 by Shela Lapine, RN Outcome: Progressing Goal: Respiratory complications will improve 11/02/2023 1703 by Shela Lapine, RN Outcome: Progressing 11/02/2023 1638 by Shela Lapine, RN Outcome: Progressing Goal: Cardiovascular complication will be avoided 11/02/2023 1703 by Shela Lapine, RN Outcome: Progressing 11/02/2023 1638 by Shela Lapine, RN Outcome: Progressing   Problem: Nutrition: Goal: Adequate nutrition will be maintained 11/02/2023 1703 by Shela Lapine, RN Outcome: Progressing 11/02/2023 1638 by Shela Lapine, RN Outcome: Progressing   Problem: Coping: Goal: Level of anxiety will decrease 11/02/2023 1703 by Shela Lapine, RN Outcome: Progressing 11/02/2023 1638 by Shela Lapine, RN Outcome: Progressing   Problem: Elimination: Goal: Will not experience complications related to bowel motility 11/02/2023 1703 by Shela Lapine, RN Outcome:  Progressing 11/02/2023 1638 by Shela Lapine, RN Outcome: Progressing Goal: Will not experience complications related to urinary retention 11/02/2023 1703 by Shela Lapine, RN Outcome: Progressing 11/02/2023 1638 by Shela Lapine, RN Outcome: Progressing   Problem: Pain Managment: Goal: General experience of comfort will improve and/or be controlled 11/02/2023 1703 by Shela Lapine, RN Outcome: Progressing 11/02/2023 1638 by Shela Lapine, RN Outcome: Progressing   Problem: Safety: Goal: Ability to remain free from injury will improve 11/02/2023 1703 by Shela Lapine, RN Outcome: Progressing 11/02/2023 1638 by Shela Lapine, RN Outcome: Progressing   Problem: Skin Integrity: Goal: Risk for impaired skin integrity will decrease 11/02/2023 1703 by Shela Lapine, RN Outcome: Progressing 11/02/2023 1638 by Shela Lapine, RN Outcome: Progressing

## 2023-11-02 NOTE — ED Provider Notes (Signed)
[place] [facility] DE[facility]MENT AT [facility] Provider Note   CSN: [id number] Arrival date & time: 11/02/23  9081     Patient presents with: Weakness   Waylon Lyan Moyano is a 63 y.o. male.   Patient with general weakness for past few days. Denies acute or abrupt change today. Was at [facility], and was sent to ED for evaluation. Pt denies any focal or unilateral numbness or weakness. Denies headache. No chest pain or discomfort. No sob. No cough or uri symptoms. No fever or chills. Denies abd pain or nvd. Denies any recent blood loss, rectal bleeding or melena. No dysuria or gu c/o. Has chronic wound to right foot - states appearance is c/w baseline, no new or worsening pain, redness, swelling, odor, or purulent drainage.   The history is provided by the patient, medical records and the EMS personnel.  Weakness Associated symptoms: no abdominal pain, no chest pain, no cough, no diarrhea, no dysuria, no fever, no headaches, no shortness of breath and no vomiting        Prior to Admission medications   Medication Sig Start Date End Date Taking? Authorizing Provider  apixaban  (ELIQUIS ) 5 MG TABS tablet Take 1 tablet (5 mg total) by mouth 2 (two) times daily. Patient taking differently: Take 5 mg by mouth daily. 10/16/23 01/02/2024 Yes Kandis Perkins, DO  aspirin  EC 81 MG tablet Take 1 tablet (81 mg total) by mouth daily. Swallow whole. 10/16/23  Yes Kandis Perkins, DO  atorvastatin  (LIPITOR ) 80 MG tablet Take 1 tablet (80 mg total) by mouth daily. 10/16/23 01/03/2024 Yes Bender, Perkins, DO  dapagliflozin  propanediol (FARXIGA ) 10 MG TABS tablet Take 1 tablet (10 mg total) by mouth daily before breakfast. 10/16/23  Yes Bender, Perkins, DO  ezetimibe  (ZETIA ) 10 MG tablet Take 1 tablet (10 mg total) by mouth daily. 10/16/23  Yes Kandis Perkins, DO  ferrous sulfate  325 (65 FE) MG tablet Take 1 tablet (325 mg total) by mouth daily. 10/16/23  Yes Kandis Perkins, DO  furosemide  (LASIX ) 20 MG  tablet Take 1 tablet (20 mg total) by mouth daily as needed for fluid or edema. Please take on tablet if your have shortness of breath, leg swelling, gain more than 3 pounds within one day or gain more than 5 pounds within one week. 10/16/23 10/15/24 Yes Bender, Perkins, DO  metFORMIN  (GLUCOPHAGE ) 500 MG tablet Take 2 tablets (1,000 mg total) by mouth 2 (two) times daily with a meal. Patient taking differently: Take 1,000 mg by mouth daily. 09/11/23 11/02/23 Yes Tobie Gaines, DO  metoprolol  succinate (TOPROL -XL) 25 MG 24 hr tablet TAKE 1 TABLET BY MOUTH DAILY 10/16/23  Yes Kandis Perkins, DO  Multiple Vitamin (MULTIVITAMIN) tablet Take 1 tablet by mouth daily.   Yes [provider]  spironolactone  (ALDACTONE ) 25 MG tablet Take 1 tablet (25 mg total) by mouth daily. 10/16/23 10/15/24 Yes Bender, Perkins, DO  tamsulosin  (FLOMAX ) 0.4 MG CAPS capsule Take 1 capsule (0.4 mg total) by mouth daily after supper. 10/16/23  Yes Kandis Perkins, DO  amoxicillin -clavulanate (AUGMENTIN ) 875-125 MG tablet Take 1 tablet by mouth every 12 (twelve) hours. Patient not taking: Reported on 11/02/2023 10/16/23   Kandis Perkins, DO    Allergies: Patient has no known allergies.    Review of Systems  Constitutional:  Negative for chills and fever.  HENT:  Negative for sore throat.   Eyes:  Negative for visual disturbance.  Respiratory:  Negative for cough and shortness of breath.  Cardiovascular:  Negative for chest pain.  Gastrointestinal:  Negative for abdominal pain, blood in stool, diarrhea and vomiting.  Genitourinary:  Negative for decreased urine volume, dysuria and flank pain.  Musculoskeletal:  Negative for back pain and neck pain.  Neurological:  Positive for weakness. Negative for headaches.    Updated Vital Signs BP 123/72   Pulse (!) 112   Temp 98.4 F (36.9 C)   Resp 18   SpO2 100%   Physical Exam Vitals and nursing note reviewed.  Constitutional:      Appearance: Normal appearance. He is  well-developed.  HENT:     Head: Atraumatic.     Nose: Nose normal.     Mouth/Throat:     Mouth: Mucous membranes are moist.     Pharynx: Oropharynx is clear.  Eyes:     General: No scleral icterus.    Conjunctiva/sclera: Conjunctivae normal.     Pupils: Pupils are equal, round, and reactive to light.  Neck:     Trachea: No tracheal deviation.     Comments: Trachea midline, thyroid not grossly enlarged or tender. No neck stiffness or rigidity.  Cardiovascular:     Rate and Rhythm: Normal rate and regular rhythm.     Pulses: Normal pulses.     Heart sounds: Normal heart sounds. No murmur heard.    No friction rub. No gallop.  Pulmonary:     Effort: Pulmonary effort is normal. No accessory muscle usage or respiratory distress.     Breath sounds: Normal breath sounds.  Abdominal:     General: Bowel sounds are normal. There is no distension.     Palpations: Abdomen is soft.     Tenderness: There is no abdominal tenderness. There is no guarding.  Genitourinary:    Comments: No cva tenderness. Musculoskeletal:        General: No swelling or tenderness.     Cervical back: Normal range of motion and neck supple. No rigidity.     Comments: Chronic wound to right foot - see attached photo. No acute cellulitis. Foot is of normal color and warmth. Pulse palp/   Skin:    General: Skin is warm and dry.     Findings: No rash.  Neurological:     Mental Status: He is alert.     Comments: Alert, speech clear. Motor/sens grossly intact bil.   Psychiatric:        Mood and Affect: Mood normal.     (all labs ordered are listed, but only abnormal results are displayed) Results for orders placed or performed during the hospital encounter of 11/02/23  CBG monitoring, ED   Collection Time: 11/02/23  9:24 AM  Result Value Ref Range   Glucose-Capillary 208 (H) 70 - 99 mg/dL  Comprehensive metabolic panel   Collection Time: 11/02/23  9:26 AM  Result Value Ref Range   Sodium 122 (L) 135 - 145  mmol/L   Potassium 5.8 (H) 3.5 - 5.1 mmol/L   Chloride 95 (L) 98 - 111 mmol/L   CO2 17 (L) 22 - 32 mmol/L   Glucose, Bld 237 (H) 70 - 99 mg/dL   BUN 61 (H) 8 - 23 mg/dL   Creatinine, Ser 6.85 (H) 0.61 - 1.24 mg/dL   Calcium  8.9 8.9 - 10.3 mg/dL   Total Protein 8.5 (H) 6.5 - 8.1 g/dL   Albumin 2.3 (L) 3.5 - 5.0 g/dL   AST 47 (H) 15 - 41 U/L   ALT 40 0 - 44 U/L  Alkaline Phosphatase 56 38 - 126 U/L   Total Bilirubin 0.8 0.0 - 1.2 mg/dL   GFR, Estimated 21 (L) >60 mL/min   Anion gap 10 5 - 15  CBC   Collection Time: 11/02/23  9:26 AM  Result Value Ref Range   WBC 9.8 4.0 - 10.5 K/uL   RBC 3.02 (L) 4.22 - 5.81 MIL/uL   Hemoglobin 8.3 (L) 13.0 - 17.0 g/dL   HCT 73.6 (L) 60.9 - 47.9 %   MCV 87.1 80.0 - 100.0 fL   MCH 27.5 26.0 - 34.0 pg   MCHC 31.6 30.0 - 36.0 g/dL   RDW 83.6 (H) 88.4 - 84.4 %   Platelets 355 150 - 400 K/uL   nRBC 0.0 0.0 - 0.2 %   DG Chest Port 1 View Result Date: 11/02/2023 CLINICAL DATA:  Generalized weakness and fatigue, history of osteomyelitis EXAM: PORTABLE CHEST 1 VIEW COMPARISON:  Chest x-ray June 27, 2023. FINDINGS: The heart size and mediastinal contours are within normal limits. Increased interstitial markings of both lung fields. No consolidation or suspicious pulmonary nodule. No pleural effusion or pneumothorax. The visualized skeletal structures are unremarkable. IMPRESSION: No new consolidation or suspicious nodule. Findings are stable to prior chest radiograph. Electronically Signed   By: Megan  Zare M.D.   On: 11/02/2023 12:12   MR CARDIAC MORPHOLOGY W WO CONTRAST Result Date: 10/13/2023 CLINICAL DATA:  10M with HFrEF, LV thrombus. Echo this admission with EF 20-25%, previously seen LV thrombus not present EXAM: CARDIAC MRI TECHNIQUE: The patient was scanned on a 1.5 Tesla Siemens magnet. A dedicated cardiac coil was used. Functional imaging was done using Fiesta sequences. 2,3, and 4 chamber views were done to assess for RWMA's. Modified Simpson's  rule using a short axis stack was used to calculate an ejection fraction on a dedicated work [employer]. The patient received 10 cc of Gadavist . After 10 minutes inversion recovery sequences were used to assess for infiltration and scar tissue. Phase contrast velocity mapping was performed above the aortic and pulmonic valves CONTRAST:  10 cc  of Gadavist  FINDINGS: Left ventricle: -Moderate dilatation -Severe systolic dysfunction. Global hypokinesis, with septal dyskinesis consistent with LBBB -Elevated ECV (40%) and diffusely elevated T2 values -Basal septal midwall LGE -RV insertion site LGE -Subendocardial LGE in apical inferolateral wall LV EF: 24% (Normal 49-79%) Absolute volumes: LV EDV: (Normal 95-215 mL) LV ESV: (Normal 25-85 mL) LV SV: 60mL (Normal 61-145 mL) CO: 6.5L/min (Normal 3.4-7.8 L/min) Indexed volumes: LV EDV: 110mL/sq-m (Normal 50-108 mL/sq-m) LV ESV: 140mL/sq-m (Normal 11-47 mL/sq-m) LV SV: 46mL/sq-m (Normal 33-72 mL/sq-m) CI: 3.4L/min/sq-m (Normal 1.8-4.2 L/min/sq-m) Right ventricle: Normal size with moderate systolic dysfunction RV EF:  35% (Normal 51-80%) Absolute volumes: RV EDV: (normal 109-217 mL) RV ESV: (Normal 23-91 mL) RV SV: 57mL (Normal 71-141 mL) CO: 6.1L/min (Normal 2.8-8.8 L/min) Indexed volumes: RV EDV: 96mL/sq-m (Normal 58-109 mL/sq-m) RV ESV: 23mL/sq-m (Normal 12-46 mL/sq-m) RV SV: 34mL/sq-m (Normal 38-71 mL/sq-m) CI: 3.2L/min/sq-m (Normal 1.7-4.2 L/min/sq-m) Left atrium: Moderate enlargement Right atrium: Normal size Mitral valve: Mild regurgitation (regurgitant fraction 18%) Aortic valve: Trivial regurgitation Tricuspid valve: Mild regurgitation (regurgitant fraction 12%) Pulmonic valve: Trivial regurgitation Aorta: Normal proximal ascending aorta Pericardium: Small effusion IMPRESSION: 1. Moderate LV dilatation with severe systolic dysfunction (EF 24%). Global hypokinesis, with septal dyskinesis consistent with LBBB 2.  Normal RV  size with moderate systolic dysfunction (EF 35%) 3. Mixed cardiomyopathy, with evidence of ischemic and nonischemic etiologies. There is basal septal  midwall LGE, which is a scar pattern seen in nonischemic cardiomyopathies and associated with worse prognosis. There is also subendocardial LGE in apical inferolateral wall, consistent with prior infarct. In addition, there is RV insertion site LGE, which is often seen in setting of elevated pulmonary pressures 4.  Mild mitral regurgitation (regurgitant fraction 18%) 5. Large LV thrombus seen on prior echo 06/2023 appears resolved. However, as LVEF remains severely reduced, there is high risk for LV thrombus recurrence and recommend continuing anticoagulation Electronically Signed   By: Lonni Nanas M.D.   On: 10/13/2023 19:30   MR CARDIAC VELOCITY FLOW MAP Result Date: 10/13/2023 CLINICAL DATA:  45M with HFrEF, LV thrombus. Echo this admission with EF 20-25%, previously seen LV thrombus not present EXAM: CARDIAC MRI TECHNIQUE: The patient was scanned on a 1.5 Tesla Siemens magnet. A dedicated cardiac coil was used. Functional imaging was done using Fiesta sequences. 2,3, and 4 chamber views were done to assess for RWMA's. Modified Simpson's rule using a short axis stack was used to calculate an ejection fraction on a dedicated work [employer] officer, [employer]. The patient received 10 cc of Gadavist . After 10 minutes inversion recovery sequences were used to assess for infiltration and scar tissue. Phase contrast velocity mapping was performed above the aortic and pulmonic valves CONTRAST:  10 cc  of Gadavist  FINDINGS: Left ventricle: -Moderate dilatation -Severe systolic dysfunction. Global hypokinesis, with septal dyskinesis consistent with LBBB -Elevated ECV (40%) and diffusely elevated T2 values -Basal septal midwall LGE -RV insertion site LGE -Subendocardial LGE in apical inferolateral wall LV EF: 24% (Normal 49-79%) Absolute volumes: LV EDV:  (Normal 95-215 mL) LV ESV: (Normal 25-85 mL) LV SV: 60mL (Normal 61-145 mL) CO: 6.5L/min (Normal 3.4-7.8 L/min) Indexed volumes: LV EDV: 159mL/sq-m (Normal 50-108 mL/sq-m) LV ESV: 113mL/sq-m (Normal 11-47 mL/sq-m) LV SV: 71mL/sq-m (Normal 33-72 mL/sq-m) CI: 3.4L/min/sq-m (Normal 1.8-4.2 L/min/sq-m) Right ventricle: Normal size with moderate systolic dysfunction RV EF:  35% (Normal 51-80%) Absolute volumes: RV EDV: (normal 109-217 mL) RV ESV: (Normal 23-91 mL) RV SV: 57mL (Normal 71-141 mL) CO: 6.1L/min (Normal 2.8-8.8 L/min) Indexed volumes: RV EDV: 76mL/sq-m (Normal 58-109 mL/sq-m) RV ESV: 67mL/sq-m (Normal 12-46 mL/sq-m) RV SV: 6mL/sq-m (Normal 38-71 mL/sq-m) CI: 3.2L/min/sq-m (Normal 1.7-4.2 L/min/sq-m) Left atrium: Moderate enlargement Right atrium: Normal size Mitral valve: Mild regurgitation (regurgitant fraction 18%) Aortic valve: Trivial regurgitation Tricuspid valve: Mild regurgitation (regurgitant fraction 12%) Pulmonic valve: Trivial regurgitation Aorta: Normal proximal ascending aorta Pericardium: Small effusion IMPRESSION: 1. Moderate LV dilatation with severe systolic dysfunction (EF 24%). Global hypokinesis, with septal dyskinesis consistent with LBBB 2.  Normal RV size with moderate systolic dysfunction (EF 35%) 3. Mixed cardiomyopathy, with evidence of ischemic and nonischemic etiologies. There is basal septal midwall LGE, which is a scar pattern seen in nonischemic cardiomyopathies and associated with worse prognosis. There is also subendocardial LGE in apical inferolateral wall, consistent with prior infarct. In addition, there is RV insertion site LGE, which is often seen in setting of elevated pulmonary pressures 4.  Mild mitral regurgitation (regurgitant fraction 18%) 5. Large LV thrombus seen on prior echo 06/2023 appears resolved. However, as LVEF remains severely reduced, there is high risk for LV thrombus recurrence and recommend continuing anticoagulation Electronically  Signed   By: Lonni Nanas M.D.   On: 10/13/2023 19:30   MR CARDIAC VELOCITY FLOW MAP Result Date: 10/13/2023 CLINICAL DATA:  45M with HFrEF, LV thrombus. Echo this admission with EF 20-25%, previously seen LV thrombus not present EXAM:  CARDIAC MRI TECHNIQUE: The patient was scanned on a 1.5 Tesla Siemens magnet. A dedicated cardiac coil was used. Functional imaging was done using Fiesta sequences. 2,3, and 4 chamber views were done to assess for RWMA's. Modified Simpson's rule using a short axis stack was used to calculate an ejection fraction on a dedicated work [employer] officer, [employer]. The patient received 10 cc of Gadavist . After 10 minutes inversion recovery sequences were used to assess for infiltration and scar tissue. Phase contrast velocity mapping was performed above the aortic and pulmonic valves CONTRAST:  10 cc  of Gadavist  FINDINGS: Left ventricle: -Moderate dilatation -Severe systolic dysfunction. Global hypokinesis, with septal dyskinesis consistent with LBBB -Elevated ECV (40%) and diffusely elevated T2 values -Basal septal midwall LGE -RV insertion site LGE -Subendocardial LGE in apical inferolateral wall LV EF: 24% (Normal 49-79%) Absolute volumes: LV EDV: (Normal 95-215 mL) LV ESV: (Normal 25-85 mL) LV SV: 60mL (Normal 61-145 mL) CO: 6.5L/min (Normal 3.4-7.8 L/min) Indexed volumes: LV EDV: 158mL/sq-m (Normal 50-108 mL/sq-m) LV ESV: 153mL/sq-m (Normal 11-47 mL/sq-m) LV SV: 91mL/sq-m (Normal 33-72 mL/sq-m) CI: 3.4L/min/sq-m (Normal 1.8-4.2 L/min/sq-m) Right ventricle: Normal size with moderate systolic dysfunction RV EF:  35% (Normal 51-80%) Absolute volumes: RV EDV: (normal 109-217 mL) RV ESV: (Normal 23-91 mL) RV SV: 57mL (Normal 71-141 mL) CO: 6.1L/min (Normal 2.8-8.8 L/min) Indexed volumes: RV EDV: 41mL/sq-m (Normal 58-109 mL/sq-m) RV ESV: 88mL/sq-m (Normal 12-46 mL/sq-m) RV SV: 46mL/sq-m (Normal 38-71 mL/sq-m) CI: 3.2L/min/sq-m (Normal 1.7-4.2  L/min/sq-m) Left atrium: Moderate enlargement Right atrium: Normal size Mitral valve: Mild regurgitation (regurgitant fraction 18%) Aortic valve: Trivial regurgitation Tricuspid valve: Mild regurgitation (regurgitant fraction 12%) Pulmonic valve: Trivial regurgitation Aorta: Normal proximal ascending aorta Pericardium: Small effusion IMPRESSION: 1. Moderate LV dilatation with severe systolic dysfunction (EF 24%). Global hypokinesis, with septal dyskinesis consistent with LBBB 2.  Normal RV size with moderate systolic dysfunction (EF 35%) 3. Mixed cardiomyopathy, with evidence of ischemic and nonischemic etiologies. There is basal septal midwall LGE, which is a scar pattern seen in nonischemic cardiomyopathies and associated with worse prognosis. There is also subendocardial LGE in apical inferolateral wall, consistent with prior infarct. In addition, there is RV insertion site LGE, which is often seen in setting of elevated pulmonary pressures 4.  Mild mitral regurgitation (regurgitant fraction 18%) 5. Large LV thrombus seen on prior echo 06/2023 appears resolved. However, as LVEF remains severely reduced, there is high risk for LV thrombus recurrence and recommend continuing anticoagulation Electronically Signed   By: Lonni Nanas M.D.   On: 10/13/2023 19:30   VAS US  ABI WITH/WO TBI Result Date: 10/13/2023  LOWER EXTREMITY DOPPLER STUDY Patient Name:  Keylor Rands  Date of Exam:   10/13/2023 Medical Rec #: [id number]            Accession #:    [id number] Date of Birth: Jan 24, 1961            Patient Gender: M Patient Age:   67 years Exam Location:  [facility] Procedure:      VAS US  ABI WITH/WO TBI Referring Phys: --------------------------------------------------------------------------------  Indications: Peripheral artery disease. High Risk Factors: Hypertension, hyperlipidemia, Diabetes, past history of                    smoking, prior MI, coronary artery disease. 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  Comparison Study: Previous exam 06/14/2023 Performing Technologist: Leigh Rom RVT, RDMS  Examination Guidelines: A complete  evaluation includes at minimum, Doppler waveform signals and systolic blood pressure reading at the level of bilateral brachial, anterior tibial, and posterior tibial arteries, when vessel segments are accessible. Bilateral testing is considered an integral part of a complete examination. Photoelectric Plethysmograph (PPG) waveforms and toe systolic pressure readings are included as required and additional duplex testing as needed. Limited examinations for reoccurring indications may be performed as noted.  ABI Findings: +--------+------------------+-----+---------+-----------------+ Right   Rt Pressure (mmHg)IndexWaveform Comment           +--------+------------------+-----+---------+-----------------+ [id number]                    triphasic                  +--------+------------------+-----+---------+-----------------+ PTA     129               1.02 triphasicaudibly triphasic +--------+------------------+-----+---------+-----------------+ DP      121               0.96 biphasic                   +--------+------------------+-----+---------+-----------------+ +---------+------------------+-----+---------+-------+ Left     Lt Pressure (mmHg)IndexWaveform Comment +---------+------------------+-----+---------+-------+ Brachial 126                    triphasic        +---------+------------------+-----+---------+-------+ PTA      141               1.12 triphasic        +---------+------------------+-----+---------+-------+ DP       134               1.06 triphasic        +---------+------------------+-----+---------+-------+ Great Toe55                0.44 Abnormal         +---------+------------------+-----+---------+-------+ +-------+-----------+-----------+------------+------------+  ABI/TBIToday's ABIToday's TBIPrevious ABIPrevious TBI +-------+-----------+-----------+------------+------------+ Right  1.02       amputation 1.07        1.85         +-------+-----------+-----------+------------+------------+ Left   1.12       0.44       1.05        0.65         +-------+-----------+-----------+------------+------------+   Summary: Right: Resting right ankle-brachial index is within normal range. Left: Resting left ankle-brachial index is within normal range. The left toe-brachial index is abnormal. *See table(s) above for measurements and observations.  Electronically signed by Norman Serve on 10/13/2023 at 9:47:06 AM.    Final    ECHOCARDIOGRAM COMPLETE Result Date: 10/12/2023    ECHOCARDIOGRAM REPORT   Patient Name:   DRAGAN TAMBURRINO Date of Exam: 10/12/2023 Medical Rec #:  [id number]           Height:       72.0 in Accession #:    [id number]          Weight:       157.0 lb Date of Birth:  Apr 04, 1961           BSA:          1.922 m Patient Age:    63 years            BP:           117/66 mmHg Patient Gender: M  HR:           129 bpm. Exam Location:  Inpatient Procedure: 2D Echo, Intracardiac Opacification Agent, Cardiac Doppler and Color            Doppler (Both Spectral and Color Flow Doppler were utilized during            procedure). Indications:    CHF  History:        Patient has prior history of Echocardiogram examinations, most                 recent 06/14/2023. CHF, CAD; Risk Factors:Hypertension,                 Dyslipidemia and Former Smoker.  Sonographer:    Therisa Crouch Referring Phys: [id number] ELSIE KATHEE SAVANNAH IMPRESSIONS  1. Left ventricular ejection fraction, by estimation, is 20 to 25%. The left ventricle has severely decreased function. The left ventricle demonstrates global hypokinesis. The left ventricular internal cavity size was mildly dilated. Left ventricular diastolic function could not be evaluated.  2. Right ventricular systolic  function is mildly reduced. The right ventricular size is normal.  3. Left atrial size was moderately dilated.  4. The mitral valve is normal in structure. Moderate mitral valve regurgitation. No evidence of mitral stenosis.  5. The aortic valve is tricuspid. Aortic valve regurgitation is not visualized. No aortic stenosis is present.  6. The inferior vena cava is dilated in size with <50% respiratory variability, suggesting right atrial pressure of 15 mmHg. Comparison(s): Changes from prior study are noted. LV thrombus at apex no longer visualized. FINDINGS  Left Ventricle: Left ventricular ejection fraction, by estimation, is 20 to 25%. The left ventricle has severely decreased function. The left ventricle demonstrates global hypokinesis. Definity  contrast agent was given IV to delineate the left ventricular endocardial borders. The left ventricular internal cavity size was mildly dilated. There is no left ventricular hypertrophy. Left ventricular diastolic function could not be evaluated. Right Ventricle: The right ventricular size is normal. No increase in right ventricular wall thickness. Right ventricular systolic function is mildly reduced. Left Atrium: Left atrial size was moderately dilated. Right Atrium: Right atrial size was normal in size. Pericardium: Trivial pericardial effusion is present. The pericardial effusion is localized near the right atrium. Mitral Valve: The mitral valve is normal in structure. Moderate mitral valve regurgitation. No evidence of mitral valve stenosis. Tricuspid Valve: The tricuspid valve is normal in structure. Tricuspid valve regurgitation is trivial. No evidence of tricuspid stenosis. Aortic Valve: The aortic valve is tricuspid. Aortic valve regurgitation is not visualized. No aortic stenosis is present. Pulmonic Valve: The pulmonic valve was not well visualized. Pulmonic valve regurgitation is not visualized. Aorta: The aortic root and ascending aorta are structurally  normal, with no evidence of dilitation. Venous: The inferior vena cava is dilated in size with less than 50% respiratory variability, suggesting right atrial pressure of 15 mmHg. IAS/Shunts: The atrial septum is grossly normal.  LEFT VENTRICLE PLAX 2D LVIDd:         5.50 cm LVIDs:         4.80 cm LV PW:         0.80 cm LV IVS:        0.80 cm LVOT diam:     2.00 cm LVOT Area:     3.14 cm  LV Volumes (MOD) LV vol d, MOD A2C: 177.0 ml LV vol d, MOD A4C: 149.0 ml LV vol s, MOD A2C: 135.0 ml LV  vol s, MOD A4C: 121.0 ml LV SV MOD A2C:     42.0 ml LV SV MOD A4C:     149.0 ml LV SV MOD BP:      37.9 ml RIGHT VENTRICLE             IVC RV Basal diam:  3.00 cm     IVC diam: 2.10 cm RV S prime:     11.20 cm/s TAPSE (M-mode): 1.4 cm LEFT ATRIUM             Index LA diam:        4.20 cm 2.18 cm/m LA Vol (A2C):   72.9 ml 37.92 ml/m LA Vol (A4C):   88.7 ml 46.14 ml/m LA Biplane Vol: 83.7 ml 43.54 ml/m   AORTA Ao Root diam: 2.90 cm Ao Asc diam:  2.80 cm MR Peak grad:    54.5 mmHg MR Mean grad:    39.0 mmHg    SHUNTS MR Vmax:         369.00 cm/s  Systemic Diam: 2.00 cm MR Vmean:        300.0 cm/s MR PISA:         0.57 cm MR PISA Eff ROA: 5 mm MR PISA Radius:  0.30 cm Shelda Bruckner MD Electronically signed by Shelda Bruckner MD Signature Date/Time: 10/12/2023/6:52:59 PM    Final    CT RENAL STONE STUDY Result Date: 10/12/2023 CLINICAL DATA:  Bilateral hydronephrosis on renal ultrasound. EXAM: CT ABDOMEN AND PELVIS WITHOUT CONTRAST TECHNIQUE: Multidetector CT imaging of the abdomen and pelvis was performed following the standard protocol without IV contrast. RADIATION DOSE REDUCTION: This exam was performed according to the departmental dose-optimization program which includes automated exposure control, adjustment of the mA and/or kV according to patient size and/or use of iterative reconstruction technique. COMPARISON:  Renal ultrasound 10/12/2023 and 06/14/2023. Chest CTA 10/11/2023. Pylarify  PET-CT  09/10/2020. FINDINGS: Lower chest: New small bilateral pleural effusions with increased septal thickening at both lung bases, suspicious for worsening mild pulmonary edema. Aortic and coronary artery atherosclerosis noted. Hepatobiliary: The liver appears unremarkable as imaged in the noncontrast state. There is new high density within the gallbladder lumen, likely reflecting vicarious excretion of contrast. No evidence of gallstones, gallbladder wall thickening or biliary dilatation. Pancreas: Unremarkable. No pancreatic ductal dilatation or surrounding inflammatory changes. Spleen: Multiple calcified granulomas. No other focal abnormality or splenomegaly. Adrenals/Urinary Tract: Both adrenal glands appear normal. As seen on the recent studies, there is new moderate bilateral hydronephrosis and hydroureter compared with the PET-CT of 09/10/2020. There is retained collecting system contrast, left greater than right, related to the recent chest CTA. There is diffuse parenchymal heterogeneity of the right kidney with associated soft tissue stranding, suspicious for pyelonephritis. No focal left renal abnormality identified. No evidence of retroperitoneal mass causing extrinsic ureteral compression. There is circumferential bladder wall thickening without apparent focal bladder mass. There is retained contrast within the urinary bladder which does not appear significantly distended. There is perivesical soft tissue stranding, especially anteriorly. Stomach/Bowel: No enteric contrast administered. The stomach appears unremarkable for its degree of distension. No evidence of bowel wall thickening, distention or surrounding inflammatory change. The appendix appears normal. Mildly prominent stool throughout the colon. Vascular/Lymphatic: There are no enlarged abdominal or pelvic lymph nodes. Incompletely visualized mildly prominent right inguinal lymph node measuring up to 1.2 cm short axis on image 91/3. Aortic and branch  vessel atherosclerosis. Reproductive: Brachytherapy seeds noted within the prostate gland. The gland appears mildly enlarged.  Other: Perivesical soft tissue stranding without focal fluid collection. The anterior abdominal wall is intact. No ascites or pneumoperitoneum. Musculoskeletal: No acute or significant osseous findings. IMPRESSION: 1. New moderate bilateral hydronephrosis and hydroureter compared with the PET-CT of 09/10/2020. There is retained contrast within the collecting systems, left greater than right, related to the recent chest CTA. No clear cause for the ureteral obstruction proximal to the bladder identified. 2. Diffuse parenchymal heterogeneity of the right kidney with associated soft tissue stranding, suspicious for pyelonephritis. 3. Circumferential bladder wall thickening with perivesical soft tissue stranding, suspicious for cystitis. 4. Interval brachytherapy treatment for prostate cancer. 5. New small bilateral pleural effusions with increased septal thickening at both lung bases, suspicious for worsening mild pulmonary edema. 6. Incompletely visualized mildly prominent right inguinal lymph node, nonspecific. 7.  Aortic Atherosclerosis (ICD10-I70.0). Electronically Signed   By: Elsie Perone M.D.   On: 10/12/2023 11:35   US  RENAL Result Date: 10/12/2023 CLINICAL DATA:  Hydronephrosis EXAM: RENAL / URINARY TRACT ULTRASOUND COMPLETE COMPARISON:  06/14/2023 ultrasound FINDINGS: Right Kidney: Renal measurements: 11.0 x 5.6 x 4.6 cm = volume: 147 mL. Echogenicity within normal limits. No mass. Moderate hydronephrosis. Left Kidney: Renal measurements: 10.5 x 4.5 x 4.9 cm = volume: 119 mL. Echogenicity within normal limits. Moderate hydronephrosis. No mass. Bladder: Thickening of the bladder wall at the bladder base. Other: Limited views of the left kidney due to patient positioning. Patient was asleep in would not wake up. IMPRESSION: 1. Moderate bilateral hydronephrosis. 2. Indeterminate  thickening of the bladder wall. This could be due to debris or malignancy. Recommend correlation with cystoscopy. Electronically Signed   By: Norman Gatlin M.D.   On: 10/12/2023 03:14   CT Angio Chest PE W/Cm &/Or Wo Cm Result Date: 10/11/2023 CLINICAL DATA:  Pulmonary embolism suspected, positive D-dimer, tachycardia EXAM: CT ANGIOGRAPHY CHEST WITH CONTRAST TECHNIQUE: Multidetector CT imaging of the chest was performed using the standard protocol during bolus administration of intravenous contrast. Multiplanar CT image reconstructions and MIPs were obtained to evaluate the vascular anatomy. RADIATION DOSE REDUCTION: This exam was performed according to the departmental dose-optimization program which includes automated exposure control, adjustment of the mA and/or kV according to patient size and/or use of iterative reconstruction technique. CONTRAST:  75mL OMNIPAQUE  IOHEXOL  350 MG/ML SOLN COMPARISON:  Chest radiograph 06/27/2023; CT chest 05/22/2020; PET/CT 07/11/2020 FINDINGS: Cardiovascular: Negative for acute pulmonary embolism. Normal caliber thoracic aorta. Coronary artery and aortic atherosclerotic calcification. Mild cardiomegaly. Mediastinum/Nodes: Calcified mediastinal and hilar lymph nodes. Trachea and esophagus are unremarkable. Lungs/Pleura: Interlobular and peribronchial thickening greatest in the lower lobes. Patchy ground-glass opacities in the lower lobes. Findings favor edema. No focal consolidation, pleural effusion, or pneumothorax. Upper Abdomen: Partially visualized hazy stranding about the right kidney. Right hydronephrosis with possible thickening and hyperenhancement of the ureteral urothelium. Prominent left extrarenal pelvis. Musculoskeletal: No acute fracture. Review of the MIP images confirms the above findings. IMPRESSION: 1. Negative for acute pulmonary embolism. 2. Mild cardiomegaly with pulmonary edema. 3. Perinephric stranding and hydronephrosis in the right kidney. Findings  are concerning for pyelonephritis and/or ureteral obstruction. Further evaluation with contrast-enhanced CT of the abdomen and pelvis is recommended. 4. Aortic Atherosclerosis (ICD10-I70.0). Electronically Signed   By: Norman Gatlin M.D.   On: 10/11/2023 23:30     EKG: EKG Interpretation Date/Time:  Thursday November 02 2023 09:51:00 EDT Ventricular Rate:  113 PR Interval:  148 QRS Duration:  146 QT Interval:  344 QTC Calculation: 472 R Axis:   82  Text  Interpretation: Sinus tachycardia Left bundle branch block Non-specific ST-t changes Confirmed by Bernard Drivers ([id number]) on 11/02/2023 10:19:06 AM  Radiology: [facility] Chest Port 1 View Result Date: 11/02/2023 CLINICAL DATA:  Generalized weakness and fatigue, history of osteomyelitis EXAM: PORTABLE CHEST 1 VIEW COMPARISON:  Chest x-ray June 27, 2023. FINDINGS: The heart size and mediastinal contours are within normal limits. Increased interstitial markings of both lung fields. No consolidation or suspicious pulmonary nodule. No pleural effusion or pneumothorax. The visualized skeletal structures are unremarkable. IMPRESSION: No new consolidation or suspicious nodule. Findings are stable to prior chest radiograph. Electronically Signed   By: Megan  Zare M.D.   On: 11/02/2023 12:12     Procedures   Medications Ordered in the ED  lactated ringers  bolus 1,000 mL (1,000 mLs Intravenous New Bag/Given 11/02/23 1038)  sodium chloride  0.9 % bolus 1,000 mL (1,000 mLs Intravenous New Bag/Given 11/02/23 1219)  sodium bicarbonate  injection 50 mEq (50 mEq Intravenous Given 11/02/23 1219)  dextrose  50 % solution 25 g (25 g Intravenous Given 11/02/23 1219)  insulin  aspart (novoLOG ) injection 8 Units (8 Units Intravenous Given 11/02/23 1217)  sodium zirconium cyclosilicate (LOKELMA) packet 10 g (10 g Oral Given 11/02/23 1219)                                    Medical Decision Making Problems Addressed: AKI (acute kidney injury) [facility]): acute illness or injury with  systemic symptoms that poses a threat to life or bodily functions Chronic anemia: chronic illness or injury 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 Labs: ordered. Decision-making details documented in ED Course. Radiology: ordered and independent interpretation performed. Decision-making details documented in ED Course. ECG/medicine tests: ordered and independent interpretation performed. Decision-making details documented in ED Course. Discussion of management or test interpretation with external provider(s): medicine  Risk Prescription drug management. Decision regarding hospitalization.   Iv ns. Continuous pulse ox and cardiac monitoring. Labs ordered/sent. Imaging ordered.   Differential diagnosis includes symptomatic anemia, dehydrration, etc. Dispo decision including potential need for admission considered - will get labs and imaging and reassess.   Reviewed nursing notes and prior charts for additional history. External reports reviewed. Additional history from: EMS.   Cardiac monitor: sinus rhythm, rate 110.   Labs reviewed/interpreted by me - wbc 9, hgb 8, c/w prior. Corrected Na 124, low. NS bolus. K high. Hco3 iv, d50 iv, insulin  iv, lokelma po, ivf.  AKI.   Xrays reviewed/interpreted by me - no pna.   Medicine consulted for admission. Discussed pt. During admission, patient may benefit from MRI foot to clarify if acute osteo  of foot, ideally w contrast if renal fxn improves w hydration, or can do without.  CRITICAL CARE RE: aki with hyperkalemia, hyponatremia.  Performed by: Mansur Patti E Sung Parodi Total critical care time: 45 minutes Critical care time was exclusive of separately billable procedures and treating other patients. Critical care was necessary to treat or prevent imminent  or life-threatening deterioration. Critical care was time spent personally by me on the following activities: development of treatment plan with patient and/or surrogate as well as nursing, discussions with consultants, evaluation of patient's response to treatment, examination of patient, obtaining history from patient or surrogate, ordering and performing treatments and interventions, ordering and review of laboratory studies, ordering and review of radiographic studies, pulse oximetry and re-evaluation of patient's condition.       Final diagnoses:  Generalized weakness  Hyponatremia  Hyperkalemia  AKI (acute kidney injury) (HCC)  Sinus tachycardia  Hyperglycemia  Hypoalbuminemia  Chronic anemia  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 [facility])    ED Discharge Orders     None          Bernard Drivers, MD 11/02/23 1228

## 2023-11-02 NOTE — Progress Notes (Signed)
 New Admission Note:   Arrival Method: stretcher Mental Orientation: aa+ox4 Telemetry: ST Assessment: Completed Skin: necrotic wounds x 2 to right foot, see wound flow sheet IV: SL Pain: denies Tubes: n/a Safety Measures: Safety Fall Prevention Plan has been given, discussed and signed Admission: Completed [facility] Orientation: Patient has been orientated to the room, unit and staff.  Family: no family available  Orders have been reviewed and implemented. Will continue to monitor the patient. Call light has been placed within reach and bed alarm has been activated.   Doyal Sias, RN

## 2023-11-02 NOTE — Plan of Care (Signed)

## 2023-11-02 NOTE — ED Triage Notes (Signed)
 PT arrives via EMS from [facility]. PT reports generalized weakness and fatigue. PT was recently admitted last month and treated for osteomyelitis of his right foot. Pt states wound is looking some better. Pt is AxOx4.

## 2023-11-02 NOTE — H&P (Signed)
 Date: 11/02/2023         Patient Name:  Derek Thompson MRN: [id number]  DOB: 04-30-1961 Age / Sex: 63 y.o., male   PCP: Elicia Sharper, DO         Medical Service: Internal Medicine Teaching Service         Attending Physician: Dr. Bernard Drivers, MD    First Contact: Dr. Myrna Pager: [phone]  Second Contact: Dr. Elnora Pager: [phone]                 Chief Concern: Lethargic  History of Present Illness: 63 year old presents from homeless shelter primarily for lethargy. He came by way of EMS. He said he was feeling okay but the administrators of his shelter said he needed assistance. He is largely without acute concerns at present, but after some questioning reports that he has been feeling somewhat more lethargic for the last week.  Medical history notable for HFrEF, urinary retention with overflow incontinence secondary to prostate cancer treatment, chronic osteomyelitis of his right foot.  In the ED he was tachycardic with hyperkalemia and hyponatremia. Lokelma was given along with 2 L of IV fluid.  Review of Systems  Constitutional:  Positive for malaise/fatigue. Negative for weight loss.  Respiratory:  Negative for cough and shortness of breath.   Cardiovascular:  Negative for chest pain, palpitations and orthopnea.  Gastrointestinal:  Negative for abdominal pain, blood in stool, constipation, diarrhea and vomiting.  Genitourinary:  Negative for frequency and hematuria.       Normal urine output, changes incontinence underwear every 3-4 hours  Musculoskeletal:  Negative for falls.  Skin:        Chronic wound on right foot, not worse today.  Neurological:  Negative for dizziness, loss of consciousness and weakness.  Psychiatric/Behavioral:  Negative for depression. The patient does not have insomnia.     Allergies: No Known Allergies  Past Medical History: Per above, also pyelonephritis, myocardial infarction.  Medications: No current  facility-administered medications on file prior to encounter.   Current Outpatient Medications on File Prior to Encounter  Medication Sig Dispense Refill   apixaban  (ELIQUIS ) 5 MG TABS tablet Take 1 tablet (5 mg total) by mouth 2 (two) times daily. (Patient taking differently: Take 5 mg by mouth daily.) 60 tablet 0   aspirin  EC 81 MG tablet Take 1 tablet (81 mg total) by mouth daily. Swallow whole. 30 tablet 0   atorvastatin  (LIPITOR ) 80 MG tablet Take 1 tablet (80 mg total) by mouth daily. 30 tablet 0   dapagliflozin  propanediol (FARXIGA ) 10 MG TABS tablet Take 1 tablet (10 mg total) by mouth daily before breakfast. 30 tablet 0   ezetimibe  (ZETIA ) 10 MG tablet Take 1 tablet (10 mg total) by mouth daily. 30 tablet 0   ferrous sulfate  325 (65 FE) MG tablet Take 1 tablet (325 mg total) by mouth daily. 30 tablet 0   furosemide  (LASIX ) 20 MG tablet Take 1 tablet (20 mg total) by mouth daily as needed for fluid or edema. Please take on tablet if your have shortness of breath, leg swelling, gain more than 3 pounds within one day or gain more than 5 pounds within one week. 30 tablet 11   metFORMIN  (GLUCOPHAGE ) 500 MG tablet Take 2 tablets (1,000 mg total) by mouth 2 (two) times daily with a meal. (Patient taking differently: Take 1,000 mg by mouth daily.) 120 tablet 11   metoprolol  succinate (TOPROL -XL) 25 MG 24 hr tablet  TAKE 1 TABLET BY MOUTH DAILY 30 tablet 0   Multiple Vitamin (MULTIVITAMIN) tablet Take 1 tablet by mouth daily.     spironolactone  (ALDACTONE ) 25 MG tablet Take 1 tablet (25 mg total) by mouth daily. 30 tablet 0   tamsulosin  (FLOMAX ) 0.4 MG CAPS capsule Take 1 capsule (0.4 mg total) by mouth daily after supper. 30 capsule 0   amoxicillin -clavulanate (AUGMENTIN ) 875-125 MG tablet Take 1 tablet by mouth every 12 (twelve) hours. (Patient not taking: Reported on 11/02/2023) 5 tablet 0     Surgical History: Reviewed, no pertinent updates.  Family History:  Reviewed, no pertinent  updates.  Social History:  Has a room in a shelter. Takes care of his medications by himself. Doesn't drink or use drugs. Used to use cigarettes but smoke now irritates him.  Physical Exam: Blood pressure 114/68, pulse (!) 115, temperature 97.8 F (36.6 C), temperature source Oral, resp. rate 20, height 6' (1.829 m), weight 70.3 kg, SpO2 100%.  Frail-appearing Oral mucosa is moist and pink Heart rate is regular and tachycardic, strong radial pulses, some JVD, pulsatile liver, no LE edema, extremities are somewhat cool Breathing comfortably on room air, no crackles in bases of lungs Skin is warm and dry Abdomen is non-tender Wound on right foot with black eschar Alert and oriented, speech is normal, no facial asymmetry  EKG:  Sinus tachycardia, stable from last encounter  Labs: Sodium 122 => 126 Potassium 5.8 => 5.2 BUN/Cr 61/3.14 BNP 4136 Serum osm 307 Hgb 8.3, stable  Images and other studies: CXR stable from last  Assessment & Plan:  [name] is a 63 y.o. with chronic HFrEF and urinary retention who presents with vague lethargy and is admitted for hyponatremia and frailty probably related to heart failure  Principal Problem:   Hyponatremia Sodium improved somewhat after 2 L fluid. No indication for hypertonic saline. Follow BMP overnight. Curiously, serum osmolality is actually elevated which isn't consistent with hypotonic hyponatremia. Glucose isn't that high, check triglycerides for cause of psuedohyponatremia. Clinically seems most consistent with hyponatremia from malnourishment and relative hypovolemia. Tenuous fluid balance given severity of HFrEF, going to defer administering further fluids at this point.  Active Problems:   AKI (acute kidney injury) (HCC) With hyperkalemia and acidosis. Improved a bit after fluids, consistent with pre-renal AKI. Trend BMP. Would avoid further fluids overnight given HFrEF.    Urinary retention Chronic with overflow  incontinence. Required Foley catheter last admission for this. Check bladder scans. Cath for PVR > 300. -continue tamsulosin  0.4 mg daily after supper    HFrEF (heart failure with reduced ejection fraction) (HCC) Chronic and severe, with EF 20-25%. I'm unsure about adherence to his medications. I doubt acute heart failure exacerbation as cause of hyponatremia, but he looks somewhat volume overloaded after 2 L fluid so I'm holding beta blocker. Blood pressure is okay so holding antihypertensives. Holding renally active agents in setting of AKI.    History of LV thrombus Not present on prior cardiac imaging. Continue apixaban  5 mg bid.    Hyperkalemia From AKI. Improved after a dose of Lokelma in the ED. Will re-dose now, check BMP in a.m.    Anemia Chronic, multifactorial in setting of chronic disease and iron deficiency. Tsat in June 11%, may benefit from IV iron. Check ferritin.     Chronic osteomyelitis involving lower leg, right (HCC) Stable. No systemic illness. No antibiotics.    CAD With history of MI. Continue daily baby aspirin .    Frailty  With signs of malnutrition, probably due to severe chronic HFrEF, complicated by chronic osteomyelitis.  Level of care: med surg Diet: regular IVF: LR VTE: apixaban  for LV thrombus Code: full Surrogate: Mother is HCPOA  Signed: Ozell Kung MD 11/02/2023, 2:11 PM Pager: [phone]

## 2023-11-03 ENCOUNTER — Observation Stay (HOSPITAL_COMMUNITY): Payer: MEDICAID

## 2023-11-03 DIAGNOSIS — R531 Weakness: Principal | ICD-10-CM

## 2023-11-03 DIAGNOSIS — R000 Tachycardia, unspecified: Secondary | ICD-10-CM

## 2023-11-03 LAB — BASIC METABOLIC PANEL WITH GFR
Anion gap: 9 (ref 5–15)
Anion gap: 12 (ref 5–15)
Anion gap: 10 (ref 5–15)
Anion gap: 9 (ref 5–15)
BUN: 66 mg/dL — ABNORMAL HIGH (ref 8–23)
BUN: 66 mg/dL — ABNORMAL HIGH (ref 8–23)
BUN: 65 mg/dL — ABNORMAL HIGH (ref 8–23)
BUN: 59 mg/dL — ABNORMAL HIGH (ref 8–23)
CO2: 18 mmol/L — ABNORMAL LOW (ref 22–32)
CO2: 17 mmol/L — ABNORMAL LOW (ref 22–32)
CO2: 19 mmol/L — ABNORMAL LOW (ref 22–32)
CO2: 20 mmol/L — ABNORMAL LOW (ref 22–32)
Calcium: 8.6 mg/dL — ABNORMAL LOW (ref 8.9–10.3)
Calcium: 8.5 mg/dL — ABNORMAL LOW (ref 8.9–10.3)
Calcium: 8.3 mg/dL — ABNORMAL LOW (ref 8.9–10.3)
Calcium: 8.5 mg/dL — ABNORMAL LOW (ref 8.9–10.3)
Chloride: 95 mmol/L — ABNORMAL LOW (ref 98–111)
Chloride: 95 mmol/L — ABNORMAL LOW (ref 98–111)
Chloride: 95 mmol/L — ABNORMAL LOW (ref 98–111)
Chloride: 97 mmol/L — ABNORMAL LOW (ref 98–111)
Creatinine, Ser: 3.19 mg/dL — ABNORMAL HIGH (ref 0.61–1.24)
Creatinine, Ser: 3.01 mg/dL — ABNORMAL HIGH (ref 0.61–1.24)
Creatinine, Ser: 2.93 mg/dL — ABNORMAL HIGH (ref 0.61–1.24)
Creatinine, Ser: 2.57 mg/dL — ABNORMAL HIGH (ref 0.61–1.24)
GFR, Estimated: 21 mL/min — ABNORMAL LOW (ref >60)
GFR, Estimated: 22 mL/min — ABNORMAL LOW (ref >60)
GFR, Estimated: 23 mL/min — ABNORMAL LOW (ref >60)
GFR, Estimated: 27 mL/min — ABNORMAL LOW (ref >60)
Glucose, Bld: 290 mg/dL — ABNORMAL HIGH (ref 70–99)
Glucose, Bld: 295 mg/dL — ABNORMAL HIGH (ref 70–99)
Glucose, Bld: 349 mg/dL — ABNORMAL HIGH (ref 70–99)
Glucose, Bld: 191 mg/dL — ABNORMAL HIGH (ref 70–99)
Potassium: 5.6 mmol/L — ABNORMAL HIGH (ref 3.5–5.1)
Potassium: 5.5 mmol/L — ABNORMAL HIGH (ref 3.5–5.1)
Potassium: 5.6 mmol/L — ABNORMAL HIGH (ref 3.5–5.1)
Potassium: 5.5 mmol/L — ABNORMAL HIGH (ref 3.5–5.1)
Sodium: 122 mmol/L — ABNORMAL LOW (ref 135–145)
Sodium: 124 mmol/L — ABNORMAL LOW (ref 135–145)
Sodium: 124 mmol/L — ABNORMAL LOW (ref 135–145)
Sodium: 126 mmol/L — ABNORMAL LOW (ref 135–145)

## 2023-11-03 LAB — GLUCOSE, CAPILLARY
Glucose-Capillary: 274 mg/dL — ABNORMAL HIGH (ref 70–99)
Glucose-Capillary: 271 mg/dL — ABNORMAL HIGH (ref 70–99)
Glucose-Capillary: 301 mg/dL — ABNORMAL HIGH (ref 70–99)
Glucose-Capillary: 197 mg/dL — ABNORMAL HIGH (ref 70–99)

## 2023-11-03 LAB — CBC
HCT: 25 % — ABNORMAL LOW (ref 39.0–52.0)
Hemoglobin: 8.1 g/dL — ABNORMAL LOW (ref 13.0–17.0)
MCH: 27.6 pg (ref 26.0–34.0)
MCHC: 32.4 g/dL (ref 30.0–36.0)
MCV: 85 fL (ref 80.0–100.0)
Platelets: 365 K/uL (ref 150–400)
RBC: 2.94 MIL/uL — ABNORMAL LOW (ref 4.22–5.81)
RDW: 16.3 % — ABNORMAL HIGH (ref 11.5–15.5)
WBC: 9.4 K/uL (ref 4.0–10.5)
nRBC: 0 % (ref 0.0–0.2)

## 2023-11-03 LAB — HEPATIC FUNCTION PANEL
ALT: 36 U/L (ref 0–44)
AST: 33 U/L (ref 15–41)
Albumin: 2.1 g/dL — ABNORMAL LOW (ref 3.5–5.0)
Alkaline Phosphatase: 53 U/L (ref 38–126)
Bilirubin, Direct: 0.1 mg/dL (ref 0.0–0.2)
Indirect Bilirubin: 0.6 mg/dL (ref 0.3–0.9)
Total Bilirubin: 0.7 mg/dL (ref 0.0–1.2)
Total Protein: 7.6 g/dL (ref 6.5–8.1)

## 2023-11-03 LAB — PROTEIN / CREATININE RATIO, URINE
Creatinine, Urine: 94 mg/dL
Protein Creatinine Ratio: 1.47 mg/mg{creat} — ABNORMAL HIGH (ref 0.00–0.15)
Total Protein, Urine: 138 mg/dL

## 2023-11-03 LAB — MAGNESIUM: Magnesium: 2.3 mg/dL (ref 1.7–2.4)

## 2023-11-03 LAB — BRAIN NATRIURETIC PEPTIDE: B Natriuretic Peptide: 3980.4 pg/mL — ABNORMAL HIGH (ref 0.0–100.0)

## 2023-11-03 LAB — TSH: TSH: 3.204 u[IU]/mL (ref 0.350–4.500)

## 2023-11-03 LAB — PROCALCITONIN: Procalcitonin: 0.51 ng/mL

## 2023-11-03 LAB — FERRITIN: Ferritin: 1007 ng/mL — ABNORMAL HIGH (ref 24–336)

## 2023-11-03 LAB — LACTIC ACID, PLASMA: Lactic Acid, Venous: 1.3 mmol/L (ref 0.5–1.9)

## 2023-11-03 LAB — TRIGLYCERIDES: Triglycerides: 103 mg/dL (ref <150)

## 2023-11-03 MED ORDER — SODIUM ZIRCONIUM CYCLOSILICATE 5 G PO PACK
5.0000 g | PACK | Freq: Once | ORAL | Status: AC
  Administered 2023-11-04: 5 g via ORAL
  Filled 2023-11-03: qty 1

## 2023-11-03 MED ORDER — SODIUM ZIRCONIUM CYCLOSILICATE 5 G PO PACK
5.0000 g | PACK | Freq: Once | ORAL | Status: DC
  Filled 2023-11-03: qty 1

## 2023-11-03 MED ORDER — CHLORHEXIDINE GLUCONATE CLOTH 2 % EX PADS
6.0000 | MEDICATED_PAD | Freq: Every day | CUTANEOUS | Status: DC
  Administered 2023-11-03 – 2023-12-22 (×52): 6 via TOPICAL

## 2023-11-03 MED ORDER — INSULIN ASPART 100 UNIT/ML IJ SOLN
0.0000 [IU] | Freq: Every day | INTRAMUSCULAR | Status: DC
  Administered 2023-11-04: 3 [IU] via SUBCUTANEOUS
  Administered 2023-11-05 – 2023-11-14 (×4): 2 [IU] via SUBCUTANEOUS
  Administered 2023-11-15: 3 [IU] via SUBCUTANEOUS

## 2023-11-03 MED ORDER — ACETAMINOPHEN 325 MG PO TABS
650.0000 mg | ORAL_TABLET | Freq: Four times a day (QID) | ORAL | Status: DC | PRN
  Administered 2023-11-03: 650 mg via ORAL
  Filled 2023-11-03: qty 2

## 2023-11-03 MED ORDER — SODIUM CHLORIDE 0.9 % IV SOLN
1.0000 g | INTRAVENOUS | Status: DC
  Administered 2023-11-03: 1 g via INTRAVENOUS
  Filled 2023-11-03: qty 10

## 2023-11-03 MED ORDER — INSULIN ASPART 100 UNIT/ML IJ SOLN
0.0000 [IU] | Freq: Three times a day (TID) | INTRAMUSCULAR | Status: DC
  Administered 2023-11-03: 7 [IU] via SUBCUTANEOUS
  Administered 2023-11-04: 3 [IU] via SUBCUTANEOUS
  Administered 2023-11-04: 1 [IU] via SUBCUTANEOUS
  Administered 2023-11-04: 5 [IU] via SUBCUTANEOUS
  Administered 2023-11-05: 2 [IU] via SUBCUTANEOUS
  Administered 2023-11-05: 7 [IU] via SUBCUTANEOUS
  Administered 2023-11-06 (×2): 1 [IU] via SUBCUTANEOUS
  Administered 2023-11-07: 5 [IU] via SUBCUTANEOUS
  Administered 2023-11-07 – 2023-11-08 (×2): 1 [IU] via SUBCUTANEOUS
  Administered 2023-11-08 – 2023-11-09 (×2): 2 [IU] via SUBCUTANEOUS
  Administered 2023-11-09: 1 [IU] via SUBCUTANEOUS
  Administered 2023-11-10: 5 [IU] via SUBCUTANEOUS
  Administered 2023-11-10 – 2023-11-11 (×5): 2 [IU] via SUBCUTANEOUS
  Administered 2023-11-12: 1 [IU] via SUBCUTANEOUS
  Administered 2023-11-12: 3 [IU] via SUBCUTANEOUS
  Administered 2023-11-12: 2 [IU] via SUBCUTANEOUS
  Administered 2023-11-13: 3 [IU] via SUBCUTANEOUS
  Administered 2023-11-13: 1 [IU] via SUBCUTANEOUS
  Administered 2023-11-13 – 2023-11-14 (×4): 2 [IU] via SUBCUTANEOUS
  Administered 2023-11-15: 3 [IU] via SUBCUTANEOUS
  Administered 2023-11-15: 5 [IU] via SUBCUTANEOUS
  Administered 2023-11-15 – 2023-11-17 (×5): 2 [IU] via SUBCUTANEOUS

## 2023-11-03 MED ORDER — SODIUM ZIRCONIUM CYCLOSILICATE 5 G PO PACK
5.0000 g | PACK | Freq: Once | ORAL | Status: AC
  Administered 2023-11-03: 5 g via ORAL
  Filled 2023-11-03: qty 1

## 2023-11-03 MED ORDER — APIXABAN 5 MG PO TABS
5.0000 mg | ORAL_TABLET | Freq: Two times a day (BID) | ORAL | Status: DC
  Administered 2023-11-03: 5 mg via ORAL
  Filled 2023-11-03: qty 1

## 2023-11-03 MED ORDER — LIDOCAINE HCL URETHRAL/MUCOSAL 2 % EX GEL
1.0000 | Freq: Once | CUTANEOUS | Status: DC
  Filled 2023-11-03: qty 6

## 2023-11-03 MED ORDER — LACTATED RINGERS IV SOLN: INTRAVENOUS | Status: AC

## 2023-11-03 MED ORDER — SODIUM ZIRCONIUM CYCLOSILICATE 10 G PO PACK
10.0000 g | PACK | Freq: Once | ORAL | Status: AC
  Administered 2023-11-03: 10 g via ORAL
  Filled 2023-11-03: qty 1

## 2023-11-03 NOTE — Progress Notes (Addendum)
 HD#0 SUBJECTIVE:  Patient Summary: Derek Thompson is a 63 y.o. with a pertinent PMH of HFrEF, CKD, CVA, and chronic osteomyelitis, who presented with lethargy and admitted for hyponatremia.   Overnight Events: No acute events overnight  Interim History: Pt seen and evaluated at the bedside this morning. He was interactive with the team and stated that he felt okay. Had long discussion with him about goals of care and compliance with medications outside of the hospital.   OBJECTIVE:  Vital Signs: Vitals:   11/02/23 2119 11/03/23 0139 11/03/23 0439 11/03/23 0802  BP: 126/78 113/69 (!) 113/94 130/83  Pulse:  (!) 121 (!) 124 (!) 115  Resp: 18  18 18   Temp: 97.9 F (36.6 C) 98.1 F (36.7 C) 98 F (36.7 C) 98.5 F (36.9 C)  TempSrc:      SpO2: 99% 98% 99% 100%  Weight:      Height:       Supplemental O2: Room Air SpO2: 100 %  Filed Weights   11/02/23 1234 11/02/23 1545  Weight: 70.3 kg 69.2 kg     Intake/Output Summary (Last 24 hours) at 11/03/2023 1420 Last data filed at 11/03/2023 0400 Gross per 24 hour  Intake 300 ml  Output 320 ml  Net -20 ml   Net IO Since Admission: -20 mL [11/03/23 1420]  Physical Exam: Physical Exam General: Pleasant, well-appearing man laying in bed. No acute distress. Head: Normocephalic. Atraumatic. CV: RRR. No LE edema Pulmonary: Crackles notable in RLL Skin: Warm and dry.  Neuro: A&Ox3. Moves all extremities. Normal sensation. No focal deficit. Psych: Normal mood and flat affect  Patient Lines/Drains/Airways Status     Active Line/Drains/Airways     Name Placement date Placement time Site Days   Peripheral IV Anterior;Left;Proximal Forearm --  --  Forearm  --   Peripheral IV 11/03/23 22 G 1.75 Left;Proximal Forearm 11/03/23  0945  Forearm  less than 1   Urethral Catheter Mekides Nida, RN Coude 16 Fr. 11/03/23  1034  Coude  less than 1   Wound / Incision (Open or Dehisced) 09/01/23 Non-pressure wound;Amputation Foot  Anterior;Right amputation of right big toe in february, reddened foot area with purulent drainage present 09/01/23  1721  Foot  63   Wound 11/02/23 1655 Vascular Ulcer Foot Anterior;Right 11/02/23  1655  Foot  1   Wound 11/02/23 1658 Vascular Ulcer Foot Right;Lateral 11/02/23  1658  Foot  1            Pertinent labs and imaging:      Latest Ref Rng & Units 11/03/2023    4:23 AM 11/02/2023    9:26 AM 10/16/2023    6:16 AM  CBC  WBC 4.0 - 10.5 K/uL 9.4  9.8  8.0   Hemoglobin 13.0 - 17.0 g/dL 8.1  8.3  8.2   Hematocrit 39.0 - 52.0 % 25.0  26.3  26.0   Platelets 150 - 400 K/uL 365  355  418        Latest Ref Rng & Units 11/03/2023    8:24 AM 11/03/2023    8:23 AM 11/03/2023    4:23 AM  CMP  Glucose 70 - 99 mg/dL  704  709   BUN 8 - 23 mg/dL  66  66   Creatinine 9.38 - 1.24 mg/dL  6.98  6.80   Sodium 864 - 145 mmol/L  124  122   Potassium 3.5 - 5.1 mmol/L  5.5  5.6   Chloride 98 -  111 mmol/L  95  95   CO2 22 - 32 mmol/L  17  18   Calcium  8.9 - 10.3 mg/dL  8.5  8.6   Total Protein 6.5 - 8.1 g/dL 7.6     Total Bilirubin 0.0 - 1.2 mg/dL 0.7     Alkaline Phos 38 - 126 U/L 53     AST 15 - 41 U/L 33     ALT 0 - 44 U/L 36       CT RENAL STONE STUDY Result Date: 11/03/2023 CLINICAL DATA:  Abdominal flank pain.  Kidney stones suspected. EXAM: CT ABDOMEN AND PELVIS WITHOUT CONTRAST TECHNIQUE: Multidetector CT imaging of the abdomen and pelvis was performed following the standard protocol without IV contrast. RADIATION DOSE REDUCTION: This exam was performed according to the departmental dose-optimization program which includes automated exposure control, adjustment of the mA and/or kV according to patient size and/or use of iterative reconstruction technique. COMPARISON:  10/12/2023 FINDINGS: Lower chest: Dependent atelectasis. Hepatobiliary: No suspicious focal abnormality in the liver on this study without intravenous contrast. There is no evidence for gallstones, gallbladder wall thickening, or  pericholecystic fluid. No intrahepatic or extrahepatic biliary dilation. Pancreas: No focal mass lesion. No dilatation of the main duct. No intraparenchymal cyst. No peripancreatic edema. Spleen: Calcified granulomata noted in the splenic parenchyma. In the relatively short interval since the prior study, the patient has developed a 2.9 cm subcapsular low-density lesion in the posterior spleen, probably an infarct given the rapid appearance. No overlying rib fracture but if there is a history of trauma, splenic injury would also be a consideration. Adrenals/Urinary Tract: No adrenal nodule or mass. No adrenal nodule or mass. Bilateral hydroureteronephrosis is similar to progressive in the interval. Circumferential bladder wall thickening evident. Stomach/Bowel: Stomach is unremarkable. No gastric wall thickening. No evidence of outlet obstruction. Duodenum is normally positioned as is the ligament of Treitz. No small bowel wall thickening. No small bowel dilatation. The terminal ileum is normal. The appendix is normal. No gross colonic mass. No colonic wall thickening. Diverticular changes are noted in the left colon without evidence of diverticulitis. Vascular/Lymphatic: There is moderate atherosclerotic calcification of the abdominal aorta without aneurysm. There is no gastrohepatic or hepatoduodenal ligament lymphadenopathy. No retroperitoneal or mesenteric lymphadenopathy. No pelvic sidewall lymphadenopathy. Reproductive: Brachytherapy seeds are seen in the prostate gland. Other: No intraperitoneal free fluid. Musculoskeletal: No worrisome lytic or sclerotic osseous abnormality. IMPRESSION: 1. Bilateral hydroureteronephrosis is similar to progressive in the interval. Circumferential bladder wall thickening evident. Imaging features compatible with bladder outlet obstruction. Similar moderate to large bladder volume. 2. In the relatively short interval since the prior study, the patient has developed a 2.9 cm  subcapsular low-density lesion in the posterior spleen, probably an infarct given the rapid appearance. No overlying rib fracture but if there is a history of trauma, splenic injury would also be a consideration. No evidence for perisplenic fluid/hemorrhage. 3. Left colonic diverticulosis without diverticulitis. 4.  Aortic Atherosclerosis (ICD10-I70.0). Electronically Signed   By: Camellia Candle M.D.   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 This AM Urine Na 66, Urine osmolality 332. Received two L NS yesterday with transient mild improvement of hyponatremia, but this AM back to 122. Likely multifactorial.  Significant history of urinary retention. CT today showing bilateral hydroureteronephrosis with moderate to large bladder volume. Will place coude catheter today continue to monitor BMPs and urine output.  UA today with leukocytes, WBC, and few bacteria. Due to retention and history of pyelonephritis, will cover with rocephin .   #Hyponatremia Presented to ED with Na of 122. This AM Urine Na 66, Urine osmolality 332. Received two L NS yesterday with transient mild improvement of hyponatremia, but this AM back to 122. Likely multifactorial secondary to poor intake and urinary retention.  Additionally has severe CHF.   #History of LV thrombus Not present on recent TTE and Cardiac MRI. Pt is noncompliant with eliquis  at home and today CT showed new splenic infarct. Encouraged eliquis  adherence.    #Hyperkalemia  Likely secondary to AKI. 5.6 this morning. Will give lokelma  and continue to improve renal function.   #Elevated protein Gap Noted today with total protein 7.6 and albumin 2.6. Will obtain serum electrophoresis to further investigate  #T2DM BG elevated this admission, will initiate SSI   Best Practice: Diet: Renal diet IVF: Fluids: LR, Rate: 75/hr VTE: apixaban  (ELIQUIS ) tablet 2.5 mg Start: 11/02/23 1500 Code: Full  Disposition planning: Therapy Recs: pending,  Family Contact: [name] Kitty, to be notified. DISPO: Anticipated discharge pending  Signature:  Schuyler Novak, DO [facility]  2:20 PM, 11/03/2023  On Call pager [phone]

## 2023-11-03 NOTE — Progress Notes (Signed)
 Nursing paged about patient at 9:30pm after she received handoff. She asked about the patient's baseline HR being elevated in the 120s and if the IMTS team was aware/okay with this. She also described a new onset fever of 102.7 when assessing vitals at 9:15pm. Denies increased HR from patient's baseline, hypertension, or decompensation. Went to assess the patient at bedside, who denies any back, abdominal, or head pain. He was alert and sitting up in bed comfortably. He was not aware he was febrile and felt as he had earlier in the day. Ordered acetaminophen  650mg  PRN for new fever and changed morning CBC to CBC with differential due to patient becoming febrile while on Ceftriaxone . Blood cultures were ordered to r/o new infection. Urine culture collected this morning has not been processed. Holding off on broadening antibiotics for now without any other clinical signs of infection or source. Will follow temperature and cultures closely for any signs of acute infection.  Adalbert Alberto, DO Internal Medicine Resident, PGY-1 Please contact the on call pager at [phone] for any urgent or emergent needs. 10:27 PM 11/03/2023

## 2023-11-03 NOTE — Progress Notes (Signed)
 Pt on apixaban  for LV thrombus. Ok to adjust dose back to 5mg  BID since pt only meets on criteria for reduced dose (scr>1.5) per IMTS team.  Sergio Batch, PharmD, BCIDP, AAHIVP, CPP Infectious Disease Pharmacist 11/03/2023 2:44 PM

## 2023-11-03 NOTE — Plan of Care (Signed)

## 2023-11-03 NOTE — Hospital Course (Addendum)
 The patient is a 63 year old male with a past medical history significant for heart failure with reduced ejection fraction (HFrEF), chronic urinary retention requiring a Foley catheter, chronic osteomyelitis of the right foot, type 2 diabetes mellitus, suspected multiple myeloma, and chronic kidney disease, who was admitted with lethargy and found to have worsening chronic osteomyelitis.  During hospitalization, he underwent a right below-knee amputation (BKA) for management of refractory chronic osteomyelitis of the right foot. The procedure was uncomplicated, and the postoperative course was uneventful. The residual limb is healing well without signs of infection or complications. The patient has actively participated in physical and occupational therapy and is progressing appropriately toward rehabilitation goals. He is currently medically ready for discharge and awaiting placement in a skilled nursing facility (SNF). Prosthetic fitting will be considered approximately three months postoperatively once the residual limb has fully healed; prosthetic training and devices are generally managed as outpatient services.  His type 2 diabetes mellitus management was adjusted during admission due to recurrent hypoglycemia and impaired renal function. Insulin  and metformin  were held initially; however, given recent hyperglycemia (blood glucose 250-300 mg/dL over several days), basal insulin  (glargine 10 units daily) was initiated. Short-acting insulin  (NovoLog ) remains withheld. The patient's most recent blood glucose was 102 mg/dL without hypoglycemic episodes. Outpatient hemoglobin A1c monitoring and ongoing dietary management are planned.  The patient experienced acute kidney injury (AKI) superimposed on suspected chronic kidney disease (CKD), with creatinine rising to a peak of 2.88 and now stabilizing near 2.8 mg/dL. Estimated GFR remains low (22 mL/min/1.73 m). Electrolytes have improved with potassium at 4.8  mEq/L and sodium stable. Metformin  remains contraindicated, and renal function is being monitored closely.  His known heart failure with reduced ejection fraction (EF 20-25% on recent echocardiogram) remained stable throughout hospitalization. He was maintained on metoprolol  succinate 50 mg daily and exhibited no signs of volume overload or decompensation. Due to renal impairment, medications such as spironolactone , sacubitril /valsartan  (Entresto ), and sodium-glucose cotransporter-2 (SGLT2) inhibitors were not initiated.  The patient's history of left ventricular thrombus and recent subscapular splenic infarct were stable without new symptoms or complications. Anticoagulation with apixaban  (Eliquis ) 5 mg twice daily was continued.  A catheter-associated urinary tract infection (CAUTI) was identified and treated during admission; the infection has resolved. The Foley catheter was last changed on 12/08/2023, and routine catheter care continues.  Workup for suspected multiple myeloma revealed a positive serum protein electrophoresis with an M spike and elevated protein gap. Bone marrow biopsy findings are concerning for multiple myeloma. Hematology/oncology was consulted during admission, and outpatient follow-up has been arranged for further management.  Regarding disposition, the patient's BCBS SNF benefits have been exhausted, and multiple Medical Financial Assistance [facility]) SNFs are currently unable to accept placement due to insurance issues and concerns about future coverage. Case management has discussed options including waiting for Medicaid approval or pursuing inpatient rehabilitation at the hospital with potential discharge to the [facility] if the patient attains independence with activities of daily living (ADLs) and is re-approved by the [place]. The patient is agreeable to hospital-based rehab while awaiting placement.    Hospital Course  The patient is a 63 year old male with a complex  medical history including HFrEF, chronic Foley due to urinary retention, chronic osteomyelitis of the right foot, and suspected multiple myeloma. He presented to the hospital with concerns of increasing lethargy and was found to have chronic osteomyelitis of the right foot. His hospital course is outlined below:  1. Status Post Right Below-Knee Amputation (BKA) for Chronic  Osteomyelitis  The patient underwent a right below-knee amputation due to refractory chronic osteomyelitis of the right foot. The surgery was uncomplicated, and postoperative recovery was uneventful. The residual limb is healing well with no evidence of infection or complications. The patient has been actively participating in physical and occupational therapy and is making steady progress. He is currently medically cleared for discharge and awaiting skilled nursing facility (SNF) placement. Prosthetic fitting will be considered approximately three months post-op once the limb has fully healed, typically managed on an outpatient basis.  2. Type 2 Diabetes Mellitus  The patient was previously managed with metformin , basal insulin  (glargine), and short-acting insulin  (NovoLog ). Due to recurrent hypoglycemia and declining renal function, metformin  and NovoLog  were held. During admission, blood glucose levels were elevated (250-300 mg/dL over several days), prompting initiation of glargine 10 units daily with ongoing blood sugar monitoring. The most recent blood glucose was 102 mg/dL without hypoglycemia. Outpatient hemoglobin A1c monitoring and dietary management are planned.  3. Acute Kidney Injury (AKI) on Suspected Chronic Kidney Disease (CKD)  The patient developed AKI superimposed on suspected CKD, with creatinine peaking at 2.88 mg/dL and stabilizing at 2.8 mg/dL. Estimated glomerular filtration rate (eGFR) remains reduced at 22 mL/min/1.73 m. Electrolytes improved with stable sodium and potassium at 4.8 mEq/L. Metformin  remains  contraindicated due to renal impairment. Renal function and electrolytes will continue to be closely monitored.  4. Heart Failure with Reduced Ejection Fraction (HFrEF)  Echocardiogram performed on 10/12/2023 demonstrated an ejection fraction of 20-25%. The patient remained clinically euvolemic during admission with no signs of heart failure exacerbation. He is maintained on metoprolol  succinate 50 mg daily. Due to renal dysfunction, initiation of spironolactone , sacubitril /valsartan  (Entresto ), or SGLT2 inhibitors was deferred.  5. History of Left Ventricular (LV) Thrombus and Subscapular Splenic Infarct  The patient has a known history of LV thrombus and was recently diagnosed with a subscapular splenic infarct. There were no acute complications during this admission. Anticoagulation with apixaban  (Eliquis ) 5 mg twice daily was continued without issue.  6. Catheter-Associated Urinary Tract Infection (CAUTI)  The patient had a CAUTI during hospitalization, which resolved with appropriate treatment. The Foley catheter was last changed on 12/08/2023, and no further signs of infection are present. Routine catheter care is ongoing.  7. Concern for Multiple Myeloma  Workup revealed a positive serum protein electrophoresis with an M spike and elevated protein gap. Bone marrow biopsy findings are concerning for multiple myeloma. Hematology/oncology was consulted during hospitalization and outpatient follow-up is arranged for definitive diagnosis and management. Renal function and electrolyte status will be monitored closely given the potential impact of myeloma on the kidneys.  8. Discharge Planning and Placement Issues  The patient's SNF benefits through BCBS are exhausted, and several MFA-affiliated facilities ([facility], [facility], [facility]) are unable to provide placement due to recurrent insurance problems and risk of coverage loss. Case management discussed next steps including awaiting  Medicaid approval or pursuing hospital-based rehabilitation. The patient is agreeable to inpatient rehab at the hospital. The [facility] has agreed to accept the patient back if he achieves independence with activities of daily living (ADLs) and gains [place] approval.  08/24: He states that he is doing well. He has not complaints today.

## 2023-11-03 NOTE — Progress Notes (Signed)
 MEWS Progress Note  Patient Details Name: Derek Thompson MRN: [id number] DOB: 1960-06-24 Today's Date: 11/03/2023   MEWS Flowsheet Documentation:  Assess: MEWS Score Temp: 98 F (36.7 C) BP: (!) 113/94 MAP (mmHg): 101 Pulse Rate: (!) 124 ECG Heart Rate: (!) 120 Resp: 18 Level of Consciousness: Alert SpO2: 99 % O2 Device: Room Air Assess: MEWS Score MEWS Temp: 0 MEWS Systolic: 0 MEWS Pulse: 2 MEWS RR: 0 MEWS LOC: 0 MEWS Score: 2 MEWS Score Color: Yellow Assess: SIRS CRITERIA SIRS Temperature : 0 SIRS Respirations : 0 SIRS Pulse: 1 SIRS WBC: 0 SIRS Score Sum : 1 Assess: if the MEWS score is Yellow or Red Were vital signs accurate and taken at a resting state?: Yes Does the patient meet 2 or more of the SIRS criteria?: No MEWS guidelines implemented : No, previously yellow, continue vital signs every 4 hours        [name] 11/03/2023, 5:42 AM

## 2023-11-03 NOTE — Plan of Care (Signed)
  Problem: Education: Goal: Knowledge of General Education information will improve Description: Including pain rating scale, medication(s)/side effects and non-pharmacologic comfort measures 11/03/2023 1345 by Shela Lapine, RN Outcome: Progressing 11/03/2023 0915 by Shela Lapine, RN Outcome: Progressing   Problem: Health Behavior/Discharge Planning: Goal: Ability to manage health-related needs will improve 11/03/2023 1345 by Shela Lapine, RN Outcome: Progressing 11/03/2023 0915 by Shela Lapine, RN Outcome: Progressing   Problem: Clinical Measurements: Goal: Ability to maintain clinical measurements within normal limits will improve 11/03/2023 1345 by Shela Lapine, RN Outcome: Progressing 11/03/2023 0915 by Shela Lapine, RN Outcome: Progressing Goal: Will remain free from infection 11/03/2023 1345 by Shela Lapine, RN Outcome: Progressing 11/03/2023 0915 by Shela Lapine, RN Outcome: Progressing Goal: Diagnostic test results will improve 11/03/2023 1345 by Shela Lapine, RN Outcome: Progressing 11/03/2023 0915 by Shela Lapine, RN Outcome: Progressing Goal: Respiratory complications will improve 11/03/2023 1345 by Shela Lapine, RN Outcome: Progressing 11/03/2023 0915 by Shela Lapine, RN Outcome: Progressing Goal: Cardiovascular complication will be avoided 11/03/2023 1345 by Shela Lapine, RN Outcome: Progressing 11/03/2023 0915 by Shela Lapine, RN Outcome: Progressing   Problem: Activity: Goal: Risk for activity intolerance will decrease 11/03/2023 1345 by Shela Lapine, RN Outcome: Progressing 11/03/2023 0915 by Shela Lapine, RN Outcome: Progressing   Problem: Nutrition: Goal: Adequate nutrition will be maintained 11/03/2023 1345 by Shela Lapine, RN Outcome: Progressing 11/03/2023 0915 by Shela Lapine, RN Outcome: Progressing   Problem: Coping: Goal: Level of anxiety will decrease 11/03/2023 1345 by Shela Lapine, RN Outcome: Progressing 11/03/2023 0915 by  Shela Lapine, RN Outcome: Progressing   Problem: Elimination: Goal: Will not experience complications related to bowel motility 11/03/2023 1345 by Shela Lapine, RN Outcome: Progressing 11/03/2023 0915 by Shela Lapine, RN Outcome: Progressing Goal: Will not experience complications related to urinary retention 11/03/2023 1345 by Shela Lapine, RN Outcome: Progressing 11/03/2023 0915 by Shela Lapine, RN Outcome: Progressing   Problem: Pain Managment: Goal: General experience of comfort will improve and/or be controlled 11/03/2023 1345 by Shela Lapine, RN Outcome: Progressing 11/03/2023 0915 by Shela Lapine, RN Outcome: Progressing   Problem: Safety: Goal: Ability to remain free from injury will improve 11/03/2023 1345 by Shela Lapine, RN Outcome: Progressing 11/03/2023 0915 by Shela Lapine, RN Outcome: Progressing   Problem: Skin Integrity: Goal: Risk for impaired skin integrity will decrease 11/03/2023 1345 by Shela Lapine, RN Outcome: Progressing 11/03/2023 0915 by Shela Lapine, RN Outcome: Progressing   Problem: Education: Goal: Ability to describe self-care measures that may prevent or decrease complications (Diabetes Survival Skills Education) will improve Outcome: Progressing Goal: Individualized Educational Video(s) Outcome: Progressing   Problem: Fluid Volume: Goal: Ability to maintain a balanced intake and output will improve Outcome: Progressing   Problem: Health Behavior/Discharge Planning: Goal: Ability to identify and utilize available resources and services will improve Outcome: Progressing Goal: Ability to manage health-related needs will improve Outcome: Progressing   Problem: Metabolic: Goal: Ability to maintain appropriate glucose levels will improve Outcome: Progressing   Problem: Nutritional: Goal: Maintenance of adequate nutrition will improve Outcome: Progressing Goal: Progress toward achieving an optimal weight will improve Outcome:  Progressing   Problem: Skin Integrity: Goal: Risk for impaired skin integrity will decrease Outcome: Progressing   Problem: Tissue Perfusion: Goal: Adequacy of tissue perfusion will improve Outcome: Progressing

## 2023-11-04 DIAGNOSIS — E875 Hyperkalemia: Secondary | ICD-10-CM | POA: Diagnosis not present

## 2023-11-04 DIAGNOSIS — R739 Hyperglycemia, unspecified: Secondary | ICD-10-CM

## 2023-11-04 DIAGNOSIS — N179 Acute kidney failure, unspecified: Secondary | ICD-10-CM | POA: Diagnosis not present

## 2023-11-04 DIAGNOSIS — D649 Anemia, unspecified: Secondary | ICD-10-CM

## 2023-11-04 DIAGNOSIS — L97511 Non-pressure chronic ulcer of other part of right foot limited to breakdown of skin: Secondary | ICD-10-CM

## 2023-11-04 LAB — BASIC METABOLIC PANEL WITH GFR
Anion gap: 10 (ref 5–15)
Anion gap: 9 (ref 5–15)
Anion gap: 11 (ref 5–15)
BUN: 57 mg/dL — ABNORMAL HIGH (ref 8–23)
BUN: 56 mg/dL — ABNORMAL HIGH (ref 8–23)
BUN: 52 mg/dL — ABNORMAL HIGH (ref 8–23)
CO2: 19 mmol/L — ABNORMAL LOW (ref 22–32)
CO2: 19 mmol/L — ABNORMAL LOW (ref 22–32)
CO2: 17 mmol/L — ABNORMAL LOW (ref 22–32)
Calcium: 8.7 mg/dL — ABNORMAL LOW (ref 8.9–10.3)
Calcium: 8.2 mg/dL — ABNORMAL LOW (ref 8.9–10.3)
Calcium: 8.4 mg/dL — ABNORMAL LOW (ref 8.9–10.3)
Chloride: 99 mmol/L (ref 98–111)
Chloride: 96 mmol/L — ABNORMAL LOW (ref 98–111)
Chloride: 95 mmol/L — ABNORMAL LOW (ref 98–111)
Creatinine, Ser: 2.42 mg/dL — ABNORMAL HIGH (ref 0.61–1.24)
Creatinine, Ser: 2.2 mg/dL — ABNORMAL HIGH (ref 0.61–1.24)
Creatinine, Ser: 2.29 mg/dL — ABNORMAL HIGH (ref 0.61–1.24)
GFR, Estimated: 29 mL/min — ABNORMAL LOW (ref >60)
GFR, Estimated: 33 mL/min — ABNORMAL LOW (ref >60)
GFR, Estimated: 31 mL/min — ABNORMAL LOW (ref >60)
Glucose, Bld: 145 mg/dL — ABNORMAL HIGH (ref 70–99)
Glucose, Bld: 235 mg/dL — ABNORMAL HIGH (ref 70–99)
Glucose, Bld: 203 mg/dL — ABNORMAL HIGH (ref 70–99)
Potassium: 5.1 mmol/L (ref 3.5–5.1)
Potassium: 4.8 mmol/L (ref 3.5–5.1)
Potassium: 5.1 mmol/L (ref 3.5–5.1)
Sodium: 128 mmol/L — ABNORMAL LOW (ref 135–145)
Sodium: 124 mmol/L — ABNORMAL LOW (ref 135–145)
Sodium: 123 mmol/L — ABNORMAL LOW (ref 135–145)

## 2023-11-04 LAB — GLUCOSE, CAPILLARY
Glucose-Capillary: 127 mg/dL — ABNORMAL HIGH (ref 70–99)
Glucose-Capillary: 259 mg/dL — ABNORMAL HIGH (ref 70–99)
Glucose-Capillary: 205 mg/dL — ABNORMAL HIGH (ref 70–99)
Glucose-Capillary: 260 mg/dL — ABNORMAL HIGH (ref 70–99)

## 2023-11-04 LAB — CBC WITH DIFFERENTIAL/PLATELET
Abs Immature Granulocytes: 0.12 K/uL — ABNORMAL HIGH (ref 0.00–0.07)
Basophils Absolute: 0 K/uL (ref 0.0–0.1)
Basophils Relative: 0 %
Eosinophils Absolute: 0 K/uL (ref 0.0–0.5)
Eosinophils Relative: 0 %
HCT: 22.4 % — ABNORMAL LOW (ref 39.0–52.0)
Hemoglobin: 7.2 g/dL — ABNORMAL LOW (ref 13.0–17.0)
Immature Granulocytes: 2 %
Lymphocytes Relative: 12 %
Lymphs Abs: 1 K/uL (ref 0.7–4.0)
MCH: 27.4 pg (ref 26.0–34.0)
MCHC: 32.1 g/dL (ref 30.0–36.0)
MCV: 85.2 fL (ref 80.0–100.0)
Monocytes Absolute: 0.7 K/uL (ref 0.1–1.0)
Monocytes Relative: 9 %
Neutro Abs: 6.2 K/uL (ref 1.7–7.7)
Neutrophils Relative %: 77 %
Platelets: 307 K/uL (ref 150–400)
RBC: 2.63 MIL/uL — ABNORMAL LOW (ref 4.22–5.81)
RDW: 16.3 % — ABNORMAL HIGH (ref 11.5–15.5)
WBC: 7.9 K/uL (ref 4.0–10.5)
nRBC: 0 % (ref 0.0–0.2)

## 2023-11-04 LAB — CULTURE, OB URINE: Culture: NO GROWTH

## 2023-11-04 LAB — CBC
HCT: 25.5 % — ABNORMAL LOW (ref 39.0–52.0)
Hemoglobin: 8.5 g/dL — ABNORMAL LOW (ref 13.0–17.0)
MCH: 28.2 pg (ref 26.0–34.0)
MCHC: 33.3 g/dL (ref 30.0–36.0)
MCV: 84.7 fL (ref 80.0–100.0)
Platelets: 323 K/uL (ref 150–400)
RBC: 3.01 MIL/uL — ABNORMAL LOW (ref 4.22–5.81)
RDW: 15.8 % — ABNORMAL HIGH (ref 11.5–15.5)
WBC: 8.5 K/uL (ref 4.0–10.5)
nRBC: 0 % (ref 0.0–0.2)

## 2023-11-04 LAB — MAGNESIUM: Magnesium: 2.1 mg/dL (ref 1.7–2.4)

## 2023-11-04 LAB — PREPARE RBC (CROSSMATCH)

## 2023-11-04 MED ORDER — LINEZOLID 600 MG PO TABS
600.0000 mg | ORAL_TABLET | Freq: Two times a day (BID) | ORAL | Status: DC
  Administered 2023-11-04 – 2023-11-10 (×12): 600 mg via ORAL
  Filled 2023-11-04 (×12): qty 1

## 2023-11-04 MED ORDER — LACTATED RINGERS IV SOLN: INTRAVENOUS | Status: DC

## 2023-11-04 MED ORDER — SODIUM CHLORIDE 0.9% IV SOLUTION: Freq: Once | INTRAVENOUS | Status: AC

## 2023-11-04 MED ORDER — LINEZOLID 600 MG/300ML IV SOLN
600.0000 mg | Freq: Two times a day (BID) | INTRAVENOUS | Status: DC
  Administered 2023-11-04: 600 mg via INTRAVENOUS
  Filled 2023-11-04 (×2): qty 300

## 2023-11-04 MED ORDER — INSULIN GLARGINE-YFGN 100 UNIT/ML ~~LOC~~ SOLN
5.0000 [IU] | Freq: Every day | SUBCUTANEOUS | Status: DC
  Filled 2023-11-04: qty 0.05

## 2023-11-04 MED ORDER — SODIUM CHLORIDE 0.9 % IV SOLN
2.0000 g | Freq: Two times a day (BID) | INTRAVENOUS | Status: DC
  Administered 2023-11-04 – 2023-11-07 (×7): 2 g via INTRAVENOUS
  Filled 2023-11-04 (×7): qty 12.5

## 2023-11-04 MED ORDER — LACTATED RINGERS IV SOLN: INTRAVENOUS | Status: AC

## 2023-11-04 NOTE — Progress Notes (Signed)
 HD#1 SUBJECTIVE:  Patient Summary: Derek Thompson is a 63 y.o. with a pertinent PMH of End-stage HFrEF, CKD, CVA, bladder outlet obstruction s/p coude foley catheter during last admission 10/2023, and chronic osteomyelitis , admitted hypovolemic hyponatremia and AKI with coude foley and on CTX, improving  Overnight Events: Fever 100.58F; given tylenol  and collected Bcx.   Interim History: Patient reports no chills, chest pain, shortness of breath, nausea or abdominal pain. Endorses hunger. Had a discussion about concerns for ostemyelitis of R lower extremity vs UTI and the need for broader spectrum antibiotics. We also discussed again the state of his heart disease. Explained what palliative care is and was agreeable to consulting with them.  OBJECTIVE:  Vital Signs: Vitals:   11/04/23 0107 11/04/23 0403 11/04/23 0600 11/04/23 0744  BP: 103/67 101/65  123/69  Pulse: (!) 106 (!) 103  (!) 120  Resp: 17     Temp: 97.9 F (36.6 C) 98.4 F (36.9 C)  99.4 F (37.4 C)  TempSrc: Oral Oral    SpO2: 96% 99%  98%  Weight:      Height:   6' (1.829 m)    Supplemental O2: Room Air SpO2: 98 %  Filed Weights   11/02/23 1234 11/02/23 1545  Weight: 70.3 kg 69.2 kg     Intake/Output Summary (Last 24 hours) at 11/04/2023 0916 Last data filed at 11/04/2023 0806 Gross per 24 hour  Intake 1687.42 ml  Output 1925 ml  Net -237.58 ml   Net IO Since Admission: -257.58 mL [11/04/23 0916]  Physical Exam: General: Pleasant, chronically ill-appearing man laying in bed. No acute distress. Head: Normocephalic. Atraumatic. CV: RRR. systolic murmur present Pulmonary: Mild rales in bilateral bases. No wheezing Abdominal: Soft, nontender, nondistended. Normal bowel sounds. Extremities: warm and dry. No LE edema. Chronic wound on the R foot without drainage, fluctuance or erythema Neuro: A&Ox3 Psych: flat and disengaged mood and affect   Patient Lines/Drains/Airways Status     Active  Line/Drains/Airways     Name Placement date Placement time Site Days   Peripheral IV 11/03/23 22 G 1.75 Left;Proximal Forearm 11/03/23  0945  Forearm  1   Peripheral IV 11/04/23 20 G 1 Posterior;Right Forearm 11/04/23  0845  Forearm  less than 1   Urethral Catheter Mekides Nida, RN Coude 16 Fr. 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, reddened foot area with purulent drainage present 09/01/23  1721  Foot  64   Wound 11/02/23 1655 Vascular Ulcer Foot Anterior;Right 11/02/23  1655  Foot  2   Wound 11/02/23 1658 Vascular Ulcer Foot Right;Lateral 11/02/23  1658  Foot  2            Pertinent labs and imaging:      Latest Ref Rng & Units 11/04/2023    4:06 AM 11/03/2023    4:23 AM 11/02/2023    9:26 AM  CBC  WBC 4.0 - 10.5 K/uL 7.9  9.4  9.8   Hemoglobin 13.0 - 17.0 g/dL 7.2  8.1  8.3   Hematocrit 39.0 - 52.0 % 22.4  25.0  26.3   Platelets 150 - 400 K/uL 307  365  355        Latest Ref Rng & Units 11/04/2023    4:06 AM 11/03/2023    9:38 PM 11/03/2023    2:56 PM  CMP  Glucose 70 -  99 mg/dL 854  808  650   BUN 8 - 23 mg/dL 57  59  65   Creatinine 0.61 - 1.24 mg/dL 7.57  7.42  7.06   Sodium 135 - 145 mmol/L 128  126  124   Potassium 3.5 - 5.1 mmol/L 5.1  5.5  5.6   Chloride 98 - 111 mmol/L 99  97  95   CO2 22 - 32 mmol/L 19  20  19    Calcium  8.9 - 10.3 mg/dL 8.7  8.5  8.3    No results found.  ASSESSMENT/PLAN:  Assessment: Principal Problem:   Hyponatremia Active Problems:   CAD (coronary artery disease)   Hyperkalemia   AKI (acute kidney injury) (HCC)   Anemia   LV (left ventricular) mural thrombus without MI (HCC)   HFrEF (heart failure with reduced ejection fraction) (HCC)   Chronic osteomyelitis involving lower leg, right (HCC)   Frailty   Urinary retention   Generalized weakness   Sinus tachycardia   Plan:  Sepsis Source UTI vs chronic  osteomyelitis of the R foot. Initially on CTX, now broadened to Linezolid  ( MRSA and enteroccocus coverage) and cefepime  (on CTX on prior admissions). Continues to be afebrile without acetaminophen  - Follow up blood cultures - Follow up urine cultures - Follow fever curve - 18mL/HR LR when not on blood transfusion for 10 hrs  AKI Bladder outlet obstruction with BL hydroureter UTI Amber color urine in the bag ~445mL as of 10AM. No gross hematuria and decreased gross pyruria. Improvement in AKI with fluid resuscitation and bladder drainage - Follow up BMP - Follow up urine culture studies - Monitor urine output - RN to flush catheter as there is concern for clogging given significant pyuria  Hyponatremia Likely multifactorial, but given improvement with continous LR infusion, likely hypovolemic hyponatremia.  - Continue on 81ml/HR LR as above - Monitor BMP BID  Biventricular HFrEF EF 20-25%. Hx of nonobstructive CAD. Nonadherent to medications. Not a candidate for advanced therapies. Suspicion of prior MI but none documented in the chart. Currently not overloaded. Mild crackles bilaterally present yesterday.  - Monitor electrolytes; goal Mg 2, K ~4. - Monitor urine output - Low threshold for trial of IV 40 mg lasix  if in respiratory distress  Hx of LV thrombus (not visualized on TTE and MR 10/2023) New subscapular splenic infarct Nonadherent to Eliquis .  - Restarted this admission; however, held today given drop in Hgb.  - Will resume if Hgb is stable in the AM  Acute on chronic anemia Low sat ratios during last hospitalization. Has not received IV iron . Will continue to hold now with sepsis and c/f bacteremia - Hold Eliquis  today - Monitor for signs of bleeding - Transfuse 1unit pRBC for Hg of 8 in patient with advanced HFrEF and CAD  Chronic osteomyelitis R foot PAD ABI during last hospitalization. Not on medications prior to admission.  Goals of care Severe protein calorie  malnutrition Discussed end stage HFrEF and recurrent hospitalizations. Today he is amaneable to palliative care consult during this admission.   Best Practice: Diet: Renal diet IVF: Fluids: LR, Rate: 75cc/HR for 10 hours post blood transfusion VTE: Holding Eliquis  on 7/5 Code: Full  Disposition planning: Therapy Recs: Pending, DME: other pending DISPO: Anticipated discharge in 2-3 days to pending PT eval pending clinical improvement.  Signature:  Hadassah Elnora [facility]  9:16 AM, 11/04/2023  On Call pager [phone]

## 2023-11-04 NOTE — Plan of Care (Signed)
  Problem: Health Behavior/Discharge Planning: Goal: Ability to manage health-related needs will improve Outcome: Progressing   Problem: Clinical Measurements: Goal: Ability to maintain clinical measurements within normal limits will improve Outcome: Progressing Goal: Will remain free from infection Outcome: Not Progressing Goal: Diagnostic test results will improve Outcome: Progressing Goal: Respiratory complications will improve Outcome: Progressing Goal: Cardiovascular complication will be avoided Outcome: Progressing   Problem: Activity: Goal: Risk for activity intolerance will decrease Outcome: Not Progressing   Problem: Nutrition: Goal: Adequate nutrition will be maintained Outcome: Not Progressing   Problem: Pain Managment: Goal: General experience of comfort will improve and/or be controlled Outcome: Progressing   Weak, eating little, await Hgb. Trip to [place] exhausted him. Tachy and temp elevated today

## 2023-11-04 NOTE — Progress Notes (Signed)
 MEWS Progress Note  Patient Details Name: Derek Thompson MRN: [id number] DOB: 10-07-1960 Today's Date: 11/04/2023   MEWS Flowsheet Documentation:  Assess: MEWS Score Temp: 98.4 F (36.9 C) BP: 101/65 MAP (mmHg): 76 Pulse Rate: (!) 103 ECG Heart Rate: (!) 120 Resp: 17 Level of Consciousness: Alert SpO2: 99 % O2 Device: Room Air Patient Activity (if Appropriate): In bed Assess: MEWS Score MEWS Temp: 0 MEWS Systolic: 0 MEWS Pulse: 1 MEWS RR: 0 MEWS LOC: 0 MEWS Score: 1 MEWS Score Color: Green Assess: SIRS CRITERIA SIRS Temperature : 0 SIRS Respirations : 0 SIRS Pulse: 1 SIRS WBC: 0 SIRS Score Sum : 1 Assess: if the MEWS score is Yellow or Red Were vital signs accurate and taken at a resting state?: Yes Does the patient meet 2 or more of the SIRS criteria?: Yes Does the patient have a confirmed or suspected source of infection?: No MEWS guidelines implemented : Yes, red Treat MEWS Interventions: Considered administering scheduled or prn medications/treatments as ordered Take Vital Signs Increase Vital Sign Frequency : Red: Q1hr x2, continue Q4hrs until patient remains green for 12hrs Escalate MEWS: Escalate: Red: Discuss with charge nurse and notify provider. Consider notifying RRT. If remains red for 2 hours consider need for higher level of care    See Physician's note.  RED MEWS Protocol implemented.  Will continue to monitor patient closely.      [name] 11/04/2023, 4:59 AM

## 2023-11-05 ENCOUNTER — Inpatient Hospital Stay (HOSPITAL_COMMUNITY): Payer: MEDICAID

## 2023-11-05 DIAGNOSIS — E871 Hypo-osmolality and hyponatremia: Secondary | ICD-10-CM | POA: Diagnosis not present

## 2023-11-05 DIAGNOSIS — R531 Weakness: Secondary | ICD-10-CM

## 2023-11-05 DIAGNOSIS — Z515 Encounter for palliative care: Secondary | ICD-10-CM | POA: Diagnosis not present

## 2023-11-05 DIAGNOSIS — Z7189 Other specified counseling: Secondary | ICD-10-CM

## 2023-11-05 DIAGNOSIS — L97511 Non-pressure chronic ulcer of other part of right foot limited to breakdown of skin: Secondary | ICD-10-CM

## 2023-11-05 DIAGNOSIS — D649 Anemia, unspecified: Secondary | ICD-10-CM | POA: Diagnosis not present

## 2023-11-05 DIAGNOSIS — E875 Hyperkalemia: Secondary | ICD-10-CM | POA: Diagnosis not present

## 2023-11-05 DIAGNOSIS — N179 Acute kidney failure, unspecified: Secondary | ICD-10-CM

## 2023-11-05 DIAGNOSIS — I5020 Unspecified systolic (congestive) heart failure: Secondary | ICD-10-CM

## 2023-11-05 LAB — CBC WITH DIFFERENTIAL/PLATELET
Abs Immature Granulocytes: 0.03 K/uL (ref 0.00–0.07)
Basophils Absolute: 0 K/uL (ref 0.0–0.1)
Basophils Relative: 0 %
Eosinophils Absolute: 0 K/uL (ref 0.0–0.5)
Eosinophils Relative: 0 %
HCT: 23.9 % — ABNORMAL LOW (ref 39.0–52.0)
Hemoglobin: 7.9 g/dL — ABNORMAL LOW (ref 13.0–17.0)
Immature Granulocytes: 1 %
Lymphocytes Relative: 14 %
Lymphs Abs: 0.9 K/uL (ref 0.7–4.0)
MCH: 27.7 pg (ref 26.0–34.0)
MCHC: 33.1 g/dL (ref 30.0–36.0)
MCV: 83.9 fL (ref 80.0–100.0)
Monocytes Absolute: 0.6 K/uL (ref 0.1–1.0)
Monocytes Relative: 10 %
Neutro Abs: 4.9 K/uL (ref 1.7–7.7)
Neutrophils Relative %: 75 %
Platelets: 294 K/uL (ref 150–400)
RBC: 2.85 MIL/uL — ABNORMAL LOW (ref 4.22–5.81)
RDW: 16 % — ABNORMAL HIGH (ref 11.5–15.5)
WBC: 6.5 K/uL (ref 4.0–10.5)
nRBC: 0 % (ref 0.0–0.2)

## 2023-11-05 LAB — TYPE AND SCREEN
ABO/RH(D): B POS
Antibody Screen: NEGATIVE
Unit division: 0

## 2023-11-05 LAB — GLUCOSE, CAPILLARY
Glucose-Capillary: 108 mg/dL — ABNORMAL HIGH (ref 70–99)
Glucose-Capillary: 158 mg/dL — ABNORMAL HIGH (ref 70–99)
Glucose-Capillary: 309 mg/dL — ABNORMAL HIGH (ref 70–99)
Glucose-Capillary: 245 mg/dL — ABNORMAL HIGH (ref 70–99)

## 2023-11-05 LAB — HEPATIC FUNCTION PANEL
ALT: 28 U/L (ref 0–44)
AST: 28 U/L (ref 15–41)
Albumin: 1.8 g/dL — ABNORMAL LOW (ref 3.5–5.0)
Alkaline Phosphatase: 49 U/L (ref 38–126)
Bilirubin, Direct: 0.2 mg/dL (ref 0.0–0.2)
Indirect Bilirubin: 0.4 mg/dL (ref 0.3–0.9)
Total Bilirubin: 0.6 mg/dL (ref 0.0–1.2)
Total Protein: 7.1 g/dL (ref 6.5–8.1)

## 2023-11-05 LAB — BASIC METABOLIC PANEL WITH GFR
Anion gap: 9 (ref 5–15)
BUN: 48 mg/dL — ABNORMAL HIGH (ref 8–23)
CO2: 18 mmol/L — ABNORMAL LOW (ref 22–32)
Calcium: 8.4 mg/dL — ABNORMAL LOW (ref 8.9–10.3)
Chloride: 100 mmol/L (ref 98–111)
Creatinine, Ser: 2.08 mg/dL — ABNORMAL HIGH (ref 0.61–1.24)
GFR, Estimated: 35 mL/min — ABNORMAL LOW (ref >60)
Glucose, Bld: 97 mg/dL (ref 70–99)
Potassium: 4.4 mmol/L (ref 3.5–5.1)
Sodium: 127 mmol/L — ABNORMAL LOW (ref 135–145)

## 2023-11-05 LAB — MAGNESIUM: Magnesium: 1.8 mg/dL (ref 1.7–2.4)

## 2023-11-05 LAB — BPAM RBC
Blood Product Expiration Date: 202507292359
ISSUE DATE / TIME: 202507051132
Unit Type and Rh: 7300

## 2023-11-05 MED ORDER — ACETAMINOPHEN 325 MG PO TABS
650.0000 mg | ORAL_TABLET | Freq: Four times a day (QID) | ORAL | Status: DC | PRN
  Administered 2023-11-05 – 2023-12-31 (×13): 650 mg via ORAL
  Filled 2023-11-05 (×15): qty 2

## 2023-11-05 MED ORDER — LACTATED RINGERS IV SOLN: INTRAVENOUS | Status: AC

## 2023-11-05 MED ORDER — DM-GUAIFENESIN ER 30-600 MG PO TB12
2.0000 | ORAL_TABLET | Freq: Two times a day (BID) | ORAL | Status: DC
  Administered 2023-11-05: 2 via ORAL
  Filled 2023-11-05: qty 2

## 2023-11-05 MED ORDER — LACTATED RINGERS IV BOLUS
500.0000 mL | Freq: Once | INTRAVENOUS | Status: AC
  Administered 2023-11-05: 500 mL via INTRAVENOUS

## 2023-11-05 MED ORDER — HEPARIN SODIUM (PORCINE) 5000 UNIT/ML IJ SOLN
5000.0000 [IU] | Freq: Three times a day (TID) | INTRAMUSCULAR | Status: DC
  Administered 2023-11-05 – 2023-11-10 (×15): 5000 [IU] via SUBCUTANEOUS
  Filled 2023-11-05 (×15): qty 1

## 2023-11-05 NOTE — Plan of Care (Signed)
  Problem: Health Behavior/Discharge Planning: Goal: Ability to manage health-related needs will improve Outcome: Progressing   Problem: Clinical Measurements: Goal: Ability to maintain clinical measurements within normal limits will improve Outcome: Progressing Goal: Will remain free from infection Outcome: Not Progressing Goal: Diagnostic test results will improve Outcome: Progressing Goal: Respiratory complications will improve Outcome: Progressing Goal: Cardiovascular complication will be avoided Outcome: Progressing   Problem: Activity: Goal: Risk for activity intolerance will decrease Outcome: Not Progressing   Problem: Nutrition: Goal: Adequate nutrition will be maintained Outcome: Not Progressing   Weak, one time out of bed for BM, minimal PO intake

## 2023-11-05 NOTE — Progress Notes (Signed)
 HD#2 SUBJECTIVE:  Patient Summary: Derek Thompson is a 63 y.o. with a pertinent PMH of End-stage HFrEF, CKD, CVA, bladder outlet obstruction s/p coude foley catheter during last admission 10/2023, and chronic osteomyelitis , admitted hypovolemic hyponatremia and AKI with coude foley and on CTX, improving  Overnight Events: NAE  Interim History:  Examined at bedside. Denies shortness of breath at rest, improvement in his appetite. Continues to be fatigued with minimal exertion. No abdominal pain or gross blood in urine bag. Denies chills, rigors.  OBJECTIVE:  Vital Signs: Vitals:   11/04/23 1531 11/04/23 2016 11/05/23 0005 11/05/23 0419  BP: 121/81 106/69 108/69 113/72  Pulse: (!) 128 (!) 117 (!) 108 (!) 103  Resp:      Temp: 99.7 F (37.6 C) 98.4 F (36.9 C) 98.4 F (36.9 C) 98.3 F (36.8 C)  TempSrc: Oral     SpO2: 100% 100% 99% 100%  Weight:      Height:       Supplemental O2: Room Air SpO2: 100 %  Filed Weights   11/02/23 1234 11/02/23 1545  Weight: 70.3 kg 69.2 kg     Intake/Output Summary (Last 24 hours) at 11/05/2023 9347 Last data filed at 11/05/2023 0600 Gross per 24 hour  Intake 2414.71 ml  Output 1725 ml  Net 689.71 ml   Net IO Since Admission: 132.13 mL [11/05/23 0652]  Physical Exam: General: Chronically ill appearing man in NAD Head: Moist mucus membranes CV:RRR, with systolic murmur and S3 sound present Pulmonary: Minimal rales bilaterally. No wheezing Abdominal: Soft, nontender, nondistended Extremities: warm and dry. No lower extremity edema. RN uncovered R foot wound; black eschar on both medial and lateral wounds. There is purulent drainage from the wound on the medial aspect. No tenderness to palpation. No fluctuance or erythema around it. See Media tab for photos Neuro: A&Ox3 Psych: More conversational today. More engaged. Normal mood and affect   Patient Lines/Drains/Airways Status     Active Line/Drains/Airways     Name Placement  date Placement time Site Days   Peripheral IV 11/03/23 22 G 1.75 Left;Proximal Forearm 11/03/23  0945  Forearm  1   Peripheral IV 11/04/23 20 G 1 Posterior;Right Forearm 11/04/23  0845  Forearm  less than 1   Urethral Catheter Mekides Nida, RN Coude 16 Fr. 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, reddened foot area with purulent drainage present 09/01/23  1721  Foot  64   Wound 11/02/23 1655 Vascular Ulcer Foot Anterior;Right 11/02/23  1655  Foot  2   Wound 11/02/23 1658 Vascular Ulcer Foot Right;Lateral 11/02/23  1658  Foot  2            Pertinent labs and imaging:      Latest Ref Rng & Units 11/05/2023    3:52 AM 11/04/2023    5:52 PM 11/04/2023    4:06 AM  CBC  WBC 4.0 - 10.5 K/uL 6.5  8.5  7.9   Hemoglobin 13.0 - 17.0 g/dL 7.9  8.5  7.2   Hematocrit 39.0 - 52.0 % 23.9  25.5  22.4   Platelets 150 - 400 K/uL 294  323  307        Latest Ref Rng & Units 11/05/2023    3:52 AM 11/04/2023    5:52 PM 11/04/2023   10:05 AM  CMP  Glucose 70 - 99 mg/dL 97  203  235   BUN 8 - 23 mg/dL 48  52  56   Creatinine 0.61 - 1.24 mg/dL 7.91  7.70  7.79   Sodium 135 - 145 mmol/L 127  123  124   Potassium 3.5 - 5.1 mmol/L 4.4  5.1  4.8   Chloride 98 - 111 mmol/L 100  95  96   CO2 22 - 32 mmol/L 18  17  19    Calcium  8.9 - 10.3 mg/dL 8.4  8.4  8.2    No results found.  ASSESSMENT/PLAN:  Assessment: Principal Problem:   Hyponatremia Active Problems:   CAD (coronary artery disease)   Hyperkalemia   AKI (acute kidney injury) (HCC)   Anemia   LV (left ventricular) mural thrombus without MI (HCC)   HFrEF (heart failure with reduced ejection fraction) (HCC)   Chronic osteomyelitis involving lower leg, right (HCC)   Frailty   Urinary retention   Generalized weakness   Sinus tachycardia   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 (HCC)   Chronic  anemia   Plan:  Sepsis Source UTI vs chronic osteomyelitis of the R foot. Initially on CTX, now broadened to Linezolid  ( MRSA and enteroccocus coverage) and cefepime  (on CTX on prior admissions). Afebrile - Follow up blood cultures - Follow up urine cultures - Follow fever curve - 64mL/HR LR  for another 10 hours  AKI Bladder outlet obstruction with BL hydroureter UTI Urine bag with amber color fluid.  No gross hematuria and no gross pyruria. AKI continues to improve with fluid resuscitation and coude catheter in place - Follow up BMP - Follow up urine culture studies - Monitor urine output - RN to flush catheter today  Hyponatremia Likely multifactorial, but given improvement with continous LR infusion - Continue on 75ml/HR LR as above - Monitor BMP BID  Biventricular HFrEF EF 20-25%. Hx of nonobstructive CAD. Nonadherent to medications. Not a candidate for advanced therapies. Suspicion of prior MI but none documented in the chart. Peripherally euvolemic. Minimal crackles bilaterally.  - Monitor electrolytes; goal Mg 2, K ~4. - Monitor urine output, in the past 24HR - Low threshold for trial of IV 40 mg lasix  if in respiratory distress  Hx of LV thrombus (not visualized on TTE and MR 10/2023) New subscapular splenic infarct Nonadherent to Eliquis . Held Eliquis  here with downtrending Hgb. Will hold full anticoagulation for the next 24 hours but will start VTE prophylaxis given inflammatory state and overall  impaired mobility status/stasis. - Holding therapeutic anticoagulation - Subc heparin  TID for VTE prophylaxis 7/6 - - Trend Hgb  Acute on chronic anemia Low sat ratios during last hospitalization. Has not received IV iron . Will continue to hold now with sepsis and c/f bacteremia. Once Bcx finalize, will transfuse IV iron . Hgb 7.9 today - on subc heparin  for VTE prophylaxis - Monitor for signs of bleeding - CBC in the AM  Chronic osteomyelitis R  foot PAD ABI during last hospitalization. Not on medications prior to admission. Worsening on LE wound today. Will await Palliative care consultation before re-engaging patient in pursuing debridement vs possible amputation, though doubt he is a candidate at this time.  - Continue on broad spectrum antibiotics as above - On Subc heparin  in case patient undergoes debridement  Goals of care Severe protein calorie malnutrition Discussed end stage HFrEF and recurrent hospitalizations. -Awaiting Palliative care consultation, will follow up on Monday AM  Best Practice: Diet: Renal diet IVF: Fluids: LR, Rate:  75cc/HR for 10 hours post blood transfusion VTE: Holding Eliquis  on 7/5 Code: Full  Disposition planning: Therapy Recs: Pending, DME: other pending DISPO: Anticipated discharge in 2-3 days to pending PT eval pending clinical improvement.  Signature:  Hadassah Elnora [facility]ol[facility]  6:52 AM, 11/05/2023  On Call pager [phone]

## 2023-11-05 NOTE — Consult Note (Signed)
 Palliative Care Consult Note                                  Date: 11/05/2023   Patient Name: Derek Thompson  DOB: 1960/07/05  MRN: [id number]  Age / Sex: 63 y.o., male  PCP: Elicia Sharper, DO Referring Physician: Francesco Elsie NOVAK, MD  Reason for Consultation: Establishing goals of care  HPI/Patient Profile: 63 y.o. 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  Palliative Medicine has been consulted for goals of care discussions.   Clinical Assessment and Goals of Care:   Extensive chart review has been completed including labs, vital signs, imaging, progress/consult notes, orders, medications and available advance directive documents.    I met with patient at bedside to discuss diagnosis, prognosis, GOC, and options.  I introduced Palliative Medicine as specialized medical care for people living with serious illness. It focuses on providing relief from the symptoms and stress of a serious illness.   Created space and opportunity for patient to express thoughts and feelings regarding current medical situation. Values and goals of care were attempted to be elicited.     Life Review: Patient is originally from [place] , [place] but has lived in [place] for many years. He has 3 sons, none of who live locally. 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   Functional Status: Prior to admission, patient lived at [facility] (a transitional housing shelter in [place]). He is ambulatory and independent with ADLs.   Discussion: We discussed patient's current illness and what it means in the larger  context of his ongoing co-morbidities. Current clinical status was reviewed.   Patient understands he has a bad heart. We reviewed his multiple medical issues including heart failure, chronic kidney disease, history of stroke, and chronic foot osteomyelitis.  We reviewed that he is admitted with acute kidney injury and low sodium, and that these issues are improving.  Patient expresses that his health has been declining ever since he had radiation treatment for prostate cancer.  His stated goal is to get healthy and strong again and not feel so fatigued. We discussed that unfortunately he has very advanced heart failure, with low EF. on the natural trajectory of heart failure, emphasizing that it is progressive and noncurable.  Discussed the importance of adherence to medications to decrease frequency of exacerbations.  Discussed the what ifs and encouraged patient to consider what medical interventions he would or would not want in the event his condition were to deteriorate, keeping in mind the concept of quality of life.    Discussed code status. I encouraged consideration of DNR/DNI status and provided education on evidence-based poor outcomes in similar hospitalized patients, as the cause of cardiac arrest would likely be associated with advanced illness rather than a reversible condition.  For now, patient remains open to all offered and available medical interventions to prolong life and ultimately is hopeful for improvement.  He would not want prolonged mechanical ventilation or other life support, if his condition were not improving.  A discussion was had today regarding advanced directives.  Patient states he would like  his mother and his son (D-Andrew) to make medical decisions on his behalf if needed.   I offered assistance with creating an HCPOA document, and he agrees.  Discussed the importance of continued conversation with the medical team regarding overall plan of care and  treatment options.   Review of Systems  Constitutional:  Positive for fatigue.    Objective:   Primary Diagnoses: Present on Admission: . Hyponatremia . AKI (acute kidney injury) (HCC) . Hyperkalemia . Anemia . LV (left ventricular) mural thrombus without MI (HCC) . CAD (coronary artery disease)   Physical Exam Vitals reviewed.  Constitutional:      General: He is not in acute distress.    Comments: Chronically ill-appearing  Pulmonary:     Effort: Pulmonary effort is normal.  Neurological:     Mental Status: He is alert and oriented to person, place, and time.  Psychiatric:        Behavior: Behavior normal.     Palliative Assessment/Data: PPS 50%     Assessment & Plan:   SUMMARY OF RECOMMENDATIONS   Continue current scope of care Goal of care is continued treatment and recovery to the extent this is possible Spiritual care referral for assistance with advanced directives PMT will continue to follow  Primary Decision Maker: PATIENT  Existing Vynca/ACP Documentation: None  Code Status/Advance Care Planning: Full code  Symptom Management:  Per attending  Prognosis:  Unable to determine  Discharge Planning:  To Be Determined    Thank you for allowing us  to participate in the care of Derek Thompson   Time Total: 75 minutes  Detailed review of medical records (labs, imaging, vital signs), medically appropriate exam, discussed with treatment team, counseling and education to patient, family, & staff, documenting clinical information, coordination of care.   Signed by: Recardo Loll, NP Palliative Medicine Team  Team Phone # [phone]  For individual providers, please see AMION

## 2023-11-05 NOTE — Progress Notes (Addendum)
 Nursing called about patient VS changing upon signout. Patient is febrile at 102.1, sinus tachycardia to high 130s - low 140s, and hypotensive at 90/66. Still on broad spectrum abx. Patient is not in Afib, monitor showing sinus tachycardia.  Nursing states the cough is nonstop and patient has some increased work of breath. Patient denies pain or feeling febrile, but has new productive cough and decreased appetite. Went to evaluate at bedside, patient just coughed up thick green mucus and saliva. Ordered chest XR to evaluate. If the patient does not appear overloaded on CXR results but is still tachycardic or hypotensive, will give fluids to resuscitate BP. Please page for any hypoxia or worsening clinical status. Tylenol  650mg  and Mucinex  1200mg  ordered to help cough to reduce fever.  Daishia Fetterly, DO Internal Medicine Resident, PGY-1 Please contact the on call pager at [phone] for any urgent or emergent needs. 8:43 PM 11/05/2023   10PM -- notified by nursing about prolonged hypotension. BP was 87/49 and MAP 61. Patient was given 75mL fluid earlier in the day, but this is expired. CXR results showed questionable edema and worsening peribronchial thickening from prior study, potentially reflecting edema or an atypical infection/bronchitis.  Patient is saturating 99% on RA, warm and well perfused. No increased warmth of RLE suggesting infection. He is short of breath with minimal activity, including sitting up in bed.  With fever, hypertension, and tachycardia, worried about persistent infection. Patient on cefepime  and linezolid . Gave bolus 500mL LR to resuscitate BP.  Will recheck vitals in one hour to assess if patient is fluid responsive and if symptoms persist. If unchanged, considering broadening abx vs continued fluid resuscitation. May require critical care evaluation if he starts to develop hypoxia. Please page for hypoxia or worsening clinical status.  Rosezetta Balderston, DO Internal Medicine  Resident, PGY-1 Please contact the on call pager at [phone] for any urgent or emergent needs. 10:28 PM 11/05/2023

## 2023-11-06 DIAGNOSIS — N179 Acute kidney failure, unspecified: Secondary | ICD-10-CM | POA: Diagnosis not present

## 2023-11-06 DIAGNOSIS — E875 Hyperkalemia: Secondary | ICD-10-CM | POA: Diagnosis not present

## 2023-11-06 DIAGNOSIS — R531 Weakness: Secondary | ICD-10-CM | POA: Diagnosis not present

## 2023-11-06 DIAGNOSIS — D649 Anemia, unspecified: Secondary | ICD-10-CM | POA: Diagnosis not present

## 2023-11-06 LAB — CBC WITH DIFFERENTIAL/PLATELET
Abs Immature Granulocytes: 0.03 K/uL (ref 0.00–0.07)
Basophils Absolute: 0 K/uL (ref 0.0–0.1)
Basophils Relative: 0 %
Eosinophils Absolute: 0 K/uL (ref 0.0–0.5)
Eosinophils Relative: 0 %
HCT: 25.9 % — ABNORMAL LOW (ref 39.0–52.0)
Hemoglobin: 8.2 g/dL — ABNORMAL LOW (ref 13.0–17.0)
Immature Granulocytes: 1 %
Lymphocytes Relative: 7 %
Lymphs Abs: 0.5 K/uL — ABNORMAL LOW (ref 0.7–4.0)
MCH: 27.3 pg (ref 26.0–34.0)
MCHC: 31.7 g/dL (ref 30.0–36.0)
MCV: 86.3 fL (ref 80.0–100.0)
Monocytes Absolute: 0.4 K/uL (ref 0.1–1.0)
Monocytes Relative: 6 %
Neutro Abs: 5.2 K/uL (ref 1.7–7.7)
Neutrophils Relative %: 86 %
Platelets: 303 K/uL (ref 150–400)
RBC: 3 MIL/uL — ABNORMAL LOW (ref 4.22–5.81)
RDW: 16 % — ABNORMAL HIGH (ref 11.5–15.5)
WBC: 6.1 K/uL (ref 4.0–10.5)
nRBC: 0 % (ref 0.0–0.2)

## 2023-11-06 LAB — COMPREHENSIVE METABOLIC PANEL WITH GFR
ALT: 33 U/L (ref 0–44)
AST: 34 U/L (ref 15–41)
Albumin: 1.7 g/dL — ABNORMAL LOW (ref 3.5–5.0)
Alkaline Phosphatase: 46 U/L (ref 38–126)
Anion gap: 10 (ref 5–15)
BUN: 41 mg/dL — ABNORMAL HIGH (ref 8–23)
CO2: 17 mmol/L — ABNORMAL LOW (ref 22–32)
Calcium: 8.2 mg/dL — ABNORMAL LOW (ref 8.9–10.3)
Chloride: 99 mmol/L (ref 98–111)
Creatinine, Ser: 2.04 mg/dL — ABNORMAL HIGH (ref 0.61–1.24)
GFR, Estimated: 36 mL/min — ABNORMAL LOW (ref >60)
Glucose, Bld: 96 mg/dL (ref 70–99)
Potassium: 4.6 mmol/L (ref 3.5–5.1)
Sodium: 126 mmol/L — ABNORMAL LOW (ref 135–145)
Total Bilirubin: 0.7 mg/dL (ref 0.0–1.2)
Total Protein: 6.7 g/dL (ref 6.5–8.1)

## 2023-11-06 LAB — PROTEIN ELECTROPHORESIS, SERUM
A/G Ratio: 0.6 — ABNORMAL LOW (ref 0.7–1.7)
Albumin ELP: 2.7 g/dL — ABNORMAL LOW (ref 2.9–4.4)
Alpha-1-Globulin: 0.4 g/dL (ref 0.0–0.4)
Alpha-2-Globulin: 1 g/dL (ref 0.4–1.0)
Beta Globulin: 1.1 g/dL (ref 0.7–1.3)
Gamma Globulin: 2.1 g/dL — ABNORMAL HIGH (ref 0.4–1.8)
Globulin, Total: 4.6 g/dL — ABNORMAL HIGH (ref 2.2–3.9)
M-Spike, %: 1.1 g/dL — ABNORMAL HIGH
Total Protein ELP: 7.3 g/dL (ref 6.0–8.5)

## 2023-11-06 LAB — MRSA NEXT GEN BY PCR, NASAL: MRSA by PCR Next Gen: DETECTED — AB

## 2023-11-06 LAB — RESPIRATORY PANEL BY PCR

## 2023-11-06 LAB — RESP PANEL BY RT-PCR (RSV, FLU A&B, COVID)  RVPGX2
Influenza A by PCR: NEGATIVE
Influenza B by PCR: NEGATIVE
Resp Syncytial Virus by PCR: NEGATIVE
SARS Coronavirus 2 by RT PCR: NEGATIVE

## 2023-11-06 LAB — GLUCOSE, CAPILLARY
Glucose-Capillary: 101 mg/dL — ABNORMAL HIGH (ref 70–99)
Glucose-Capillary: 123 mg/dL — ABNORMAL HIGH (ref 70–99)
Glucose-Capillary: 130 mg/dL — ABNORMAL HIGH (ref 70–99)
Glucose-Capillary: 133 mg/dL — ABNORMAL HIGH (ref 70–99)
Glucose-Capillary: 124 mg/dL — ABNORMAL HIGH (ref 70–99)

## 2023-11-06 MED ORDER — ORAL CARE MOUTH RINSE: 15.0000 mL | OROMUCOSAL | Status: DC | PRN

## 2023-11-06 MED ORDER — MUPIROCIN 2 % EX OINT
TOPICAL_OINTMENT | Freq: Two times a day (BID) | CUTANEOUS | Status: DC
  Administered 2023-11-12 – 2023-11-29 (×3): 1 via NASAL
  Filled 2023-11-06 (×8): qty 22

## 2023-11-06 MED ORDER — LACTATED RINGERS IV SOLN: INTRAVENOUS | Status: AC

## 2023-11-06 MED ORDER — METOPROLOL TARTRATE 12.5 MG HALF TABLET: 12.5000 mg | ORAL_TABLET | Freq: Two times a day (BID) | ORAL | Status: DC

## 2023-11-06 MED ORDER — METOPROLOL TARTRATE 12.5 MG HALF TABLET
12.5000 mg | ORAL_TABLET | Freq: Once | ORAL | Status: AC
  Administered 2023-11-06: 12.5 mg via ORAL
  Filled 2023-11-06: qty 1

## 2023-11-06 MED ORDER — GUAIFENESIN ER 600 MG PO TB12
1200.0000 mg | ORAL_TABLET | Freq: Two times a day (BID) | ORAL | Status: DC
  Administered 2023-11-06 – 2023-12-20 (×87): 1200 mg via ORAL
  Filled 2023-11-06 (×90): qty 2

## 2023-11-06 MED ORDER — AZITHROMYCIN 500 MG PO TABS
500.0000 mg | ORAL_TABLET | Freq: Every day | ORAL | Status: DC
  Administered 2023-11-06 – 2023-11-07 (×2): 500 mg via ORAL
  Filled 2023-11-06 (×2): qty 1

## 2023-11-06 NOTE — Progress Notes (Signed)
 MEWS Progress Note  Patient Details Name: Derek Thompson MRN: [id number] DOB: 07-16-1960 Today's Date: 11/06/2023   MEWS Flowsheet Documentation:  Assess: MEWS Score Temp: (!) 97.4 F (36.3 C) BP: 113/67 MAP (mmHg): 80 Pulse Rate: 99 ECG Heart Rate: (!) 120 Resp: 18 Level of Consciousness: Alert SpO2: 100 % O2 Device: Room Air Patient Activity (if Appropriate): In bed Assess: MEWS Score MEWS Temp: 0 MEWS Systolic: 0 MEWS Pulse: 0 MEWS RR: 0 MEWS LOC: 0 MEWS Score: 0 MEWS Score Color: Green Assess: SIRS CRITERIA SIRS Temperature : 0 SIRS Respirations : 0 SIRS Pulse: 1 SIRS WBC: 0 SIRS Score Sum : 1 Assess: if the MEWS score is Yellow or Red Were vital signs accurate and taken at a resting state?: Yes Does the patient meet 2 or more of the SIRS criteria?: Yes Does the patient have a confirmed or suspected source of infection?: Yes MEWS guidelines implemented : Yes, red Treat MEWS Interventions: Considered administering scheduled or prn medications/treatments as ordered Take Vital Signs Increase Vital Sign Frequency : Red: Q1hr x2, continue Q4hrs until patient remains green for 12hrs Escalate MEWS: Escalate: Red: Discuss with charge nurse and notify provider. Consider notifying RRT. If remains red for 2 hours consider need for higher level of care    MD made aware.  Also, made aware of new significant cough.  At the time, patient stated that the cough was non-productive.  Also, as the shift progressed, patient's blood pressure dropped to 90 systolic.  He is denying pain.  On telemetry, HR as high as 149.  Patient also had two stools earlier in the shift, mucous and loose in appearance.  Due to fever, b/p, and heart rate, patient is in RED MEWS.  MD came to bedside to assess patient several times.  Also, multiple orders including Tylenol  PO, LR 500 cc bolus, and chest xray implemented.  RED MEWS protocol Implemented.  Suzen Ice RN    Ice Suzen Dragon 11/06/2023, 4:22 AM

## 2023-11-06 NOTE — Progress Notes (Signed)
 Heart Failure Navigator Progress Note  Assessed for Heart & Vascular TOC clinic readiness.  Patient does not meet criteria due to Advanced Heart Failure team patient of Dr. Gala Romney.   Navigator will sign off at this time.    Rhae Hammock, BSN, Scientist, clinical (histocompatibility and immunogenetics) Only

## 2023-11-06 NOTE — Progress Notes (Signed)
 11P-- Paged by nursing about the patient's tachycardia sustaining in the mid-140s to low 150s. He is also slightly tachypnic at 26, but otherwise stable and afebrile. BP 113/76. Went to evaluate the patient and he denies any pain or increased work of breath. His productive cough from last night has resolved,and the patient is warm and well perfused. He is tachycardic, and slightly dyspneic on exam. His JVD was noted to the midneck. Patient received broadened antibiotics today to cover for atypical infection. Home metoprolol  initially held on admission for concerns of low output state. Lactic acid on admission was normal, and patient has remained warm and well perfused throughout hospital course. He does seem a little volume overloaded but not requiring O2. Will add back metoprolol  to rate control despite adequate IV fluid hydration. Will consider a second dose of metoprolol  if he does not respond in the first .   Derek Donath, DO Internal Medicine Resident, PGY-1 Please contact the on call pager at [phone] for any urgent or emergent needs. 11:19 PM 11/06/2023

## 2023-11-06 NOTE — Plan of Care (Signed)

## 2023-11-06 NOTE — Progress Notes (Signed)
 HD#3 SUBJECTIVE:  Patient Summary: Derek Thompson is a 63 y.o. with a pertinent PMH of End-stage HFrEF, CKD, CVA, bladder outlet obstruction s/p coude foley catheter during last admission 10/2023, and chronic osteomyelitis , admitted hypovolemic hyponatremia and AKI with coude foley and on CTX, improving  Overnight Events: Overnight, pt became tachycardic with increased work of breathing and productive cough. Repeat CXR shows worsening peribronchial thickening. He also became hypotensive at 87/49. 500mL LR bolus given. Normotensive this morning.   Interim History:  Pt seen and examined at the bedside this morning. States that overall he is feeling pretty good but is experiencing a productive cough. Denies SOB, stating that he feels normal. Discussed with him the complexity of his case and medical needs. We will follow up after palliative today.   OBJECTIVE:  Vital Signs: Vitals:   11/05/23 2327 11/06/23 0216 11/06/23 0545 11/06/23 0742  BP: 101/65 113/67 115/75 112/72  Pulse: (!) 112 99 (!) 105 (!) 106  Resp: 18 18  19   Temp: 98.7 F (37.1 C) (!) 97.4 F (36.3 C) 98 F (36.7 C) 98.2 F (36.8 C)  TempSrc: Oral Oral Oral   SpO2: 98% 100% 100% 100%  Weight:      Height:       Supplemental O2: Room Air SpO2: 100 %  Filed Weights   11/02/23 1234 11/02/23 1545  Weight: 70.3 kg 69.2 kg     Intake/Output Summary (Last 24 hours) at 11/06/2023 1025 Last data filed at 11/06/2023 0600 Gross per 24 hour  Intake 1483.53 ml  Output 1750 ml  Net -266.47 ml   Net IO Since Admission: -59.34 mL [11/06/23 1025]  Physical Exam: Const: Awake, alert in NAD HENT: Normocephalic, atraumatic, Card: RRR, No MRG, No pitting edema on LE's bilaterally  Resp: Decreased at the bases with mild crackles bilaterally  Abd: Soft, NTND, Extremities: Warm. RLE bandage in place over foot with discoloration of the toes.   Patient Lines/Drains/Airways Status     Active Line/Drains/Airways     Name  Placement date Placement time Site Days   Peripheral IV 11/03/23 22 G 1.75 Left;Proximal Forearm 11/03/23  0945  Forearm  1   Peripheral IV 11/04/23 20 G 1 Posterior;Right Forearm 11/04/23  0845  Forearm  less than 1   Urethral Catheter Mekides Nida, RN Coude 16 Fr. 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, reddened foot area with purulent drainage present 09/01/23  1721  Foot  64   Wound 11/02/23 1655 Vascular Ulcer Foot Anterior;Right 11/02/23  1655  Foot  2   Wound 11/02/23 1658 Vascular Ulcer Foot Right;Lateral 11/02/23  1658  Foot  2            Pertinent labs and imaging:      Latest Ref Rng & Units 11/06/2023    4:33 AM 11/05/2023    3:52 AM 11/04/2023    5:52 PM  CBC  WBC 4.0 - 10.5 K/uL 6.1  6.5  8.5   Hemoglobin 13.0 - 17.0 g/dL 8.2  7.9  8.5   Hematocrit 39.0 - 52.0 % 25.9  23.9  25.5   Platelets 150 - 400 K/uL 303  294  323        Latest Ref Rng & Units 11/06/2023    4:33 AM 11/05/2023    3:52 AM 11/05/2023    3:50 AM  CMP  Glucose 70 - 99 mg/dL 96  97    BUN 8 - 23 mg/dL 41  48    Creatinine 9.38 - 1.24 mg/dL 7.95  7.91    Sodium 864 - 145 mmol/L 126  127    Potassium 3.5 - 5.1 mmol/L 4.6  4.4    Chloride 98 - 111 mmol/L 99  100    CO2 22 - 32 mmol/L 17  18    Calcium  8.9 - 10.3 mg/dL 8.2  8.4    Total Protein 6.5 - 8.1 g/dL 6.7   7.1   Total Bilirubin 0.0 - 1.2 mg/dL 0.7   0.6   Alkaline Phos 38 - 126 U/L 46   49   AST 15 - 41 U/L 34   28   ALT 0 - 44 U/L 33   28    DG Chest 1 View Result Date: 11/05/2023 CLINICAL DATA:  Cough EXAM: CHEST  1 VIEW COMPARISON:  11/02/2023 FINDINGS: Heart is borderline in size. Mediastinal contours within normal limits. Peribronchial thickening and interstitial prominence, worsening since prior study. No effusions or acute bony abnormality. IMPRESSION: Worsening peribronchial thickening and interstitial prominence  which could reflect edema, atypical infection or bronchitis. Electronically Signed   By: Franky Crease M.D.   On: 11/05/2023 21:00    ASSESSMENT/PLAN:  Assessment: Principal Problem:   Hyponatremia Active Problems:   CAD (coronary artery disease)   Hyperkalemia   AKI (acute kidney injury) (HCC)   Anemia   LV (left ventricular) mural thrombus without MI (HCC)   HFrEF (heart failure with reduced ejection fraction) (HCC)   Chronic osteomyelitis involving lower leg, right (HCC)   Frailty   Urinary retention   Generalized weakness   Sinus tachycardia   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 (HCC)   Chronic anemia   Plan:  Sepsis Possible PNA Source UTI vs chronic osteomyelitis of the R foot vs PNA. Currently on linezolid  and cefepime . Will add azithromycin  for atypical coverage. BC NGTD, Urine Culture negative. Resp viral panel neg. Will obtain MRSA PCR and sputum culture and narrow antibiotics based on results.   AKI Bladder outlet obstruction with BL hydroureter UTI No gross hematuria and no gross pyruria noted in urine bag. Since admission, AKI has significantly improved with BUN 41 and CR 2.04 today. Will keep coude catheter in place. Urine culture negative. Will continue to monitor urine output.   Hyponatremia Likely multifactorial, including cardiorenal syndrome, AKI, bladder outlet obstruction, and hypovolemia. Na today 126. Pt continues to mildly improve with fluids, but this is not sustained.   Biventricular HFrEF EF 20-25%.  Hx of nonobstructive CAD. Nonadherent to medications. Not a candidate for advanced therapies. Suspicion of prior MI but none documented in the chart. Remains peripherally euvolemic, however concern for pulmonary edema with minimal crackles bilaterally and episodes of increased work of breathing. Pt did not demonstrate orthopnea this morning and consistently >95% on RA . - Monitor electrolytes; goal Mg 2, K ~4. - Monitor urine output,  1750 in last 24hrs. Net -191 in last day.    Hx of LV thrombus (not visualized on TTE and MR 10/2023) New subscapular splenic infarct Nonadherent to Eliquis  at home. Eliquis  held due to downtrending Hgb. Will continue to hold due to anemia and fluctuating Hgb. Continue subcutaneous heparin  for VTE prophylaxis. Trend Hgb  Acute on chronic anemia Low sat ratios during last hospitalization. Has not received IV iron . Will continue to hold now with sepsis and c/f  bacteremia. Once Bcx finalize (NGTD), will transfuse IV iron . Hgb 8.2. CTM daily CBCs. Transfuse if Hbg <7  Chronic osteomyelitis R foot PAD Pt has long history of persistent RLE osteomyelitis. This hospitalization it has continued to worsen. He was not on medications prior to admission and has not wished to treat it in the past. Palliative care to see pt today before re-engaging patient in pursuing debridement vs possible amputation, though doubt he is a candidate at this time. Will continue on broad spectrum antibiotics as above and subcutaneous Heparin  for VTE prophylaxis  Goals of care Palliative Consult Severe protein calorie malnutrition Discussed end stage HFrEF and recurrent hospitalizations. -Palliative to see pt today and will discuss goals of care with patient afterwards.   Best Practice: Diet: Renal diet IVF: Fluids: LR, Rate: 75cc/HR for 10 hours post blood transfusion VTE: heparin  injection 5,000 Units Start: 11/05/23 1400Holding Eliquis  on 7/5 Code: Full  Disposition planning: Therapy Recs: Pending, DME: other pending DISPO: Anticipated discharge in 2-3 days to pending PT eval pending clinical improvement.  Signature:  Schuyler Novak, DO [facility]  10:25 AM, 11/06/2023  On Call pager [phone]

## 2023-11-06 NOTE — Inpatient Diabetes Management (Signed)
 Inpatient Diabetes Program Recommendations  AACE/ADA: New Consensus Statement on Inpatient Glycemic Control (2015)  Target Ranges:  Prepandial:   less than 140 mg/dL      Peak postprandial:   less than 180 mg/dL (1-2 hours)      Critically ill patients:  140 - 180 mg/dL   Lab Results  Component Value Date   GLUCAP 101 (H) 11/06/2023   HGBA1C 8.7 (H) 09/02/2023    Review of Glycemic Control  Latest Reference Range & Units 11/05/23 08:13 11/05/23 11:37 11/05/23 16:57 11/05/23 20:06 11/06/23 07:43  Glucose-Capillary 70 - 99 mg/dL 891 (H) 841 (H) 690 (H) 245 (H) 101 (H)   Diabetes history: DM 2 Outpatient Diabetes medications: Farxiga  10 mg Daily, metformin  1000 mg Daily Current orders for Inpatient glycemic control:  Novolog  0-9 units tid + hs  Note: glucose trends increase after PO intake  Inpatient Diabetes Program Recommendations:    -   may consider adding Novolog  2 units tid meal coverage if eating >50% of meals  Thanks,  Clotilda Bull RN, MSN, BC-ADM Inpatient Diabetes Coordinator Team Pager [phone] (8a-5p)

## 2023-11-07 DIAGNOSIS — E8809 Other disorders of plasma-protein metabolism, not elsewhere classified: Secondary | ICD-10-CM | POA: Diagnosis not present

## 2023-11-07 DIAGNOSIS — R531 Weakness: Secondary | ICD-10-CM | POA: Diagnosis not present

## 2023-11-07 DIAGNOSIS — N179 Acute kidney failure, unspecified: Secondary | ICD-10-CM | POA: Diagnosis not present

## 2023-11-07 DIAGNOSIS — E875 Hyperkalemia: Secondary | ICD-10-CM | POA: Diagnosis not present

## 2023-11-07 LAB — BASIC METABOLIC PANEL WITH GFR
Anion gap: 11 (ref 5–15)
BUN: 42 mg/dL — ABNORMAL HIGH (ref 8–23)
CO2: 16 mmol/L — ABNORMAL LOW (ref 22–32)
Calcium: 8.4 mg/dL — ABNORMAL LOW (ref 8.9–10.3)
Chloride: 104 mmol/L (ref 98–111)
Creatinine, Ser: 2.09 mg/dL — ABNORMAL HIGH (ref 0.61–1.24)
GFR, Estimated: 35 mL/min — ABNORMAL LOW (ref >60)
Glucose, Bld: 174 mg/dL — ABNORMAL HIGH (ref 70–99)
Potassium: 4.8 mmol/L (ref 3.5–5.1)
Sodium: 131 mmol/L — ABNORMAL LOW (ref 135–145)

## 2023-11-07 LAB — CBC
HCT: 27.8 % — ABNORMAL LOW (ref 39.0–52.0)
Hemoglobin: 8.7 g/dL — ABNORMAL LOW (ref 13.0–17.0)
MCH: 27.2 pg (ref 26.0–34.0)
MCHC: 31.3 g/dL (ref 30.0–36.0)
MCV: 86.9 fL (ref 80.0–100.0)
Platelets: 298 K/uL (ref 150–400)
RBC: 3.2 MIL/uL — ABNORMAL LOW (ref 4.22–5.81)
RDW: 16.3 % — ABNORMAL HIGH (ref 11.5–15.5)
WBC: 6.2 K/uL (ref 4.0–10.5)
nRBC: 0 % (ref 0.0–0.2)

## 2023-11-07 LAB — GLUCOSE, CAPILLARY
Glucose-Capillary: 129 mg/dL — ABNORMAL HIGH (ref 70–99)
Glucose-Capillary: 188 mg/dL — ABNORMAL HIGH (ref 70–99)
Glucose-Capillary: 261 mg/dL — ABNORMAL HIGH (ref 70–99)

## 2023-11-07 MED ORDER — IRON SUCROSE 500 MG IVPB - SIMPLE MED
500.0000 mg | Freq: Once | INTRAVENOUS | Status: DC
  Filled 2023-11-07: qty 275

## 2023-11-07 MED ORDER — AMOXICILLIN-POT CLAVULANATE 875-125 MG PO TABS
1.0000 | ORAL_TABLET | Freq: Two times a day (BID) | ORAL | Status: AC
  Administered 2023-11-07 – 2023-11-18 (×22): 1 via ORAL
  Filled 2023-11-07 (×22): qty 1

## 2023-11-07 MED ORDER — SODIUM CHLORIDE 0.9 % IV SOLN
500.0000 mg | Freq: Once | INTRAVENOUS | Status: AC
  Administered 2023-11-07: 500 mg via INTRAVENOUS
  Filled 2023-11-07: qty 25

## 2023-11-07 MED ORDER — METOPROLOL TARTRATE 12.5 MG HALF TABLET
12.5000 mg | ORAL_TABLET | Freq: Once | ORAL | Status: AC
  Administered 2023-11-07: 12.5 mg via ORAL
  Filled 2023-11-07: qty 1

## 2023-11-07 NOTE — Progress Notes (Signed)
 HD#4 SUBJECTIVE:  Patient Summary: Derek Thompson is a 63 y.o. with a pertinent PMH of End-stage HFrEF, CKD, CVA, bladder outlet obstruction s/p coude foley catheter during last admission 10/2023, and chronic osteomyelitis , admitted hypovolemic hyponatremia and AKI with coude foley and on CTX, improving  Overnight Events: Overnight, pt became tachycardic and tachypneic. His cough from the night before had improved. Metoprolol  12.5 given x2 doses with improvement of tachycardia.  Interim History:  Pt seen and examined at the bedside this morning. He did seem disoriented in the conversation and continued to repeat okay and was unable to answer questions. This is markedly different than yesterday. He was able to state his name and where he was but was unable to follow directions without multiple re-directions.   OBJECTIVE:  Vital Signs: Vitals:   11/07/23 0246 11/07/23 0417 11/07/23 0733 11/07/23 1205  BP: (!) 96/54 107/67 102/61 (!) 92/58  Pulse: 85 87 88 83  Resp:  20 18   Temp: 98.1 F (36.7 C) 97.6 F (36.4 C) 98 F (36.7 C)   TempSrc: Oral Oral Oral   SpO2: 100% 99% 100% 100%  Weight:      Height:       Supplemental O2: Room Air SpO2: 100 %  Filed Weights   11/02/23 1234 11/02/23 1545  Weight: 70.3 kg 69.2 kg     Intake/Output Summary (Last 24 hours) at 11/07/2023 1209 Last data filed at 11/07/2023 0900 Gross per 24 hour  Intake 872.14 ml  Output 2250 ml  Net -1377.86 ml   Net IO Since Admission: -1,637.2 mL [11/07/23 1209]  Physical Exam: Const: Awake, alert in NAD HENT: Normocephalic, atraumatic, Card: RRR, No MRG, No pitting edema on LE's bilaterally  Resp: Decreased at the bases. Abd: Soft, NTND, Extremities: Warm. RLE bandage in place over foot with discoloration of the toes.  Neuro: CN II-XII in take. MS 5/5 in all major muscle groups. Pt did have difficultly following instructions without multiple redirections.   Patient Lines/Drains/Airways Status      Active Line/Drains/Airways     Name Placement date Placement time Site Days   Peripheral IV 11/03/23 22 G 1.75 Left;Proximal Forearm 11/03/23  0945  Forearm  1   Peripheral IV 11/04/23 20 G 1 Posterior;Right Forearm 11/04/23  0845  Forearm  less than 1   Urethral Catheter Mekides Nida, RN Coude 16 Fr. 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, reddened foot area with purulent drainage present 09/01/23  1721  Foot  64   Wound 11/02/23 1655 Vascular Ulcer Foot Anterior;Right 11/02/23  1655  Foot  2   Wound 11/02/23 1658 Vascular Ulcer Foot Right;Lateral 11/02/23  1658  Foot  2            Pertinent labs and imaging:      Latest Ref Rng & Units 11/07/2023   10:20 AM 11/06/2023    4:33 AM 11/05/2023    3:52 AM  CBC  WBC 4.0 - 10.5 K/uL 6.2  6.1  6.5   Hemoglobin 13.0 - 17.0 g/dL 8.7  8.2  7.9   Hematocrit 39.0 - 52.0 % 27.8  25.9  23.9   Platelets 150 - 400 K/uL 298  303  294        Latest Ref Rng & Units 11/06/2023    4:33 AM 11/05/2023    3:52 AM 11/05/2023  3:50 AM  CMP  Glucose 70 - 99 mg/dL 96  97    BUN 8 - 23 mg/dL 41  48    Creatinine 9.38 - 1.24 mg/dL 7.95  7.91    Sodium 864 - 145 mmol/L 126  127    Potassium 3.5 - 5.1 mmol/L 4.6  4.4    Chloride 98 - 111 mmol/L 99  100    CO2 22 - 32 mmol/L 17  18    Calcium  8.9 - 10.3 mg/dL 8.2  8.4    Total Protein 6.5 - 8.1 g/dL 6.7   7.1   Total Bilirubin 0.0 - 1.2 mg/dL 0.7   0.6   Alkaline Phos 38 - 126 U/L 46   49   AST 15 - 41 U/L 34   28   ALT 0 - 44 U/L 33   28    No results found.   ASSESSMENT/PLAN:  Assessment: Principal Problem:   Hyponatremia Active Problems:   CAD (coronary artery disease)   Hyperkalemia   AKI (acute kidney injury) (HCC)   Anemia   LV (left ventricular) mural thrombus without MI (HCC)   HFrEF (heart failure with reduced ejection fraction) (HCC)   Chronic osteomyelitis  involving lower leg, right (HCC)   Frailty   Urinary retention   Generalized weakness   Sinus tachycardia   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 (HCC)   Chronic anemia   Plan:  Sepsis Possible PNA Cefepime  neurotoxicity Delirium Chronic osteomyelitis of the R foot vs PNA. Urine culture negative, so do not suspect urinary source at this time.  -Pt delirious today on exam, this is a change in status from previous days. Neuro exam normal. Suspect this is likely due to cefepime  neurotoxicity.  -Due to low suspicion of PNA, will change abx to augmentin  and d/c cefepime  and azithromycin . BC NGTD, Urine Culture negative. Resp viral panel neg. MRSA nasal swab yesterday, however pt has history of multiple recent hospitalizations. Low suspicion for MRSA PNA given no fever and normal WBC. Suspect tachypnea and respiratory distress this admission is of cardiac origin and pulmonary edema.   AKI Bladder outlet obstruction with BL hydroureter UTI NAGMA Since admission, AKI has significantly improved with BUN 42 and CR 2.09 today. Significantly increased urine output today. Will keep coude catheter in place. Urine culture negative. Serum bicarb 16 today with AG 11. He has been persistently acidotic since admission, likely due to renal injury. Will continue to monitor.  Hyponatremia Na 131-improved form 126 yesterday. Likely multifactorial, including cardiorenal syndrome, AKI, bladder outlet obstruction, and hypovolemia. Could potentially be due to pseudohyponatremia due to protein gap and elevated M-Spike. Pt continues to mildly improve with fluids, but this is not sustained.   Elevated Protein gap  SPEP with Positive M-Spike SPEP resulted today with gamma globulin 2.1 and Mspike 1.1. At this time, concern for multiple myeloma due to M-spike and 2/4 CRAB criteria. Will obtain serum light chains today and urine protein analysis. This could also be contributing to hyponatremia in form  of a pseudohyponatremia. Will await further results.   Biventricular HFrEF EF 20-25%.  Hx of nonobstructive CAD. Nonadherent to medications. Not a candidate for advanced therapies. Suspicion of prior MI but none documented in the chart. Remains peripherally euvolemic, however concern for pulmonary edema with minimal crackles bilaterally and episodes of increased work of breathing. Pt did not demonstrate orthopnea this morning and consistently >95% on RA . - Monitor electrolytes; goal Mg 2,  K ~4. - Monitor urine output, 1750 in last 24hrs. Net -191 in last day.    Hx of LV thrombus (not visualized on TTE and MR 10/2023) New subscapular splenic infarct Nonadherent to Eliquis  at home. Eliquis  held due to downtrending Hgb. Will continue to hold due to anemia and fluctuating Hgb. Continue subcutaneous heparin  for VTE prophylaxis. Trend Hgb  Acute on chronic anemia Low sat ratios during last hospitalization. Has not received IV iron . With [facility] NGTD after 4 days, little to no concern for bacteremia, will consider IV iron  transfusion. Hgn 8.7 today. Will continue daily CBC's. Transfuse if Hbg <7  Chronic osteomyelitis R foot PAD Pt has long history of persistent RLE osteomyelitis. This hospitalization it has continued to worsen. He was not on medications prior to admission and has not wished to treat it in the past. Palliative care saw pt yesterday and pt expressed desire to continue to be full code, full scope. Ortho recommended   Multisystem organ failure Goals of care Palliative Consult Severe protein calorie malnutrition Discussed end stage HFrEF and recurrent hospitalizations. At this time, multi system organ failure including cardiac, renal, respiratory, and neuro.  -Palliative to see pt today and will discuss goals of care with patient afterwards.  Long conversation had yesterday and pt expressed multiple times that he would like to break this cycle and stop being in the hospital. Discussed  again his poor health overall and that he will continue this cycle based on the course he is choosing with aggressive inpatient care and noncompliance outside the hospital. Also consulted chaplain to continue conversations. At this time, he wishes to continue aggressive care including possible BKA.    Best Practice: Diet: Renal diet IVF: Fluids: LR, Rate: 75cc/HR for 10 hours post blood transfusion VTE: heparin  injection 5,000 Units Start: 11/05/23 1400Holding Eliquis  on 7/5 Code: Full  Disposition planning: Therapy Recs: Pending, DME: other pending DISPO: Anticipated discharge in 2-3 days to pending PT eval pending clinical improvement.  Signature:  Schuyler Novak, DO [facility]  12:09 PM, 11/07/2023  On Call pager [phone]

## 2023-11-07 NOTE — [place] FL2 (Signed)
 Derek Thompson  MEDICAID FL2 LEVEL OF CARE FORM     IDENTIFICATION  Patient Name: Derek Thompson Birthdate: 1961-03-21 Sex: male Admission Date (Current Location): 11/02/2023  [facility] and [place] Number:  Producer, television/film/video and Address:  [facility] [place], [address], [place], [name] [address]      Provider Number: [id number]  Attending Physician Name and Address:  Derek Elsie NOVAK, MD  Relative Name and Phone Number:  Derek Thompson (mother) [phone]    Current Level of Care: Hospital Recommended Level of Care: Skilled Nursing Facility Prior Approval Number:    Date Approved/Denied:   PASRR Number: [id number]  Discharge Plan: SNF    Current Diagnoses: Patient Active Problem List   Diagnosis Date Noted   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 (HCC) 11/04/2023   Chronic anemia 11/04/2023   Generalized weakness 11/03/2023   Sinus tachycardia 11/03/2023   HFrEF (heart failure with reduced ejection fraction) (HCC) 11/02/2023   Chronic osteomyelitis involving lower leg, right (HCC) 11/02/2023   Frailty 11/02/2023   Urinary retention 11/02/2023   Pyelonephritis and Cystitis 10/14/2023   Hyponatremia 10/12/2023   Hyperglycemia 10/12/2023   Tachycardia 10/11/2023   Screening for lung cancer 09/11/2023   Housing instability 09/11/2023   LV (left ventricular) mural thrombus without MI (HCC) 06/19/2023   NSVT (nonsustained ventricular tachycardia) (HCC) 06/19/2023   Mixed hyperlipidemia 06/19/2023   Osteomyelitis of right foot (HCC) 06/15/2023   AKI (acute kidney injury) (HCC) 06/14/2023   Anemia 06/14/2023   Hyperkalemia 11/22/2022   Iron  deficiency anemia 08/27/2022   MDD (major depressive disorder) 08/27/2022   Malignant neoplasm of prostate (HCC)    CAD (coronary artery disease) 06/10/2020   Hypertension 06/10/2020   DM2 (diabetes mellitus, type 2) (HCC) 06/10/2020   Chronic HFrEF (heart failure with reduced ejection  fraction) (HCC)     Orientation RESPIRATION BLADDER Height & Weight     Self, Time, Situation, Place  Normal Continent (Urethral Catheter) Weight: 152 lb 8.9 oz (69.2 kg) Height:  6' (182.9 cm)  BEHAVIORAL SYMPTOMS/MOOD NEUROLOGICAL BOWEL NUTRITION STATUS      Incontinent Diet (Please see discharge summary)  AMBULATORY STATUS COMMUNICATION OF NEEDS Skin   Extensive Assist Verbally Other (Comment) (Wound/Incision LDAs,wound/Incision open or dehisced non-pressure wound amputation,foot,anterior,R,amputation of,R, big toe in Feb.,Wound vacular ulcer foot,anterior,R,wound vascular,ulcer,foot,R,lateral)                       Personal Care Assistance Level of Assistance  Bathing, Feeding, Dressing Bathing Assistance: Maximum assistance Feeding assistance: Independent Dressing Assistance: Maximum assistance     Functional Limitations Info  Sight, Hearing, Speech Sight Info: Impaired Financial trader) Hearing Info: Adequate Speech Info: Adequate    SPECIAL CARE FACTORS FREQUENCY  PT (By licensed PT), OT (By licensed OT)     PT Frequency: 5x min weekly OT Frequency: 5x min weekly            Contractures Contractures Info: Not present    Additional Factors Info  Code Status, Allergies, Insulin  Sliding Scale Code Status Info: FULL Allergies Info: NKA   Insulin  Sliding Scale Info: insulin  aspart (novoLOG ) injection 0-5 Units daily at bedtime,insulin  aspart (novoLOG ) injection 0-9 Units 3 times daily with meals       Current Medications (11/07/2023):  This is the current hospital active medication list Current Facility-Administered Medications  Medication Dose Route Frequency Provider Last Rate Last Admin   acetaminophen  (TYLENOL ) tablet 650 mg  650 mg  Oral Q6H PRN Thompson, Jaden, DO   650 mg at 11/05/23 2126   amoxicillin -clavulanate (AUGMENTIN ) 875-125 MG per tablet 1 tablet  1 tablet Oral Q12H Derek Elsie NOVAK, MD       aspirin  EC tablet 81 mg  81 mg Oral Daily Thompson,  Michael, MD   81 mg at 11/07/23 0719   Chlorhexidine  Gluconate Cloth 2 % PADS 6 each  6 each Topical Daily Derek Elsie NOVAK, MD   6 each at 11/07/23 1109   ezetimibe  (ZETIA ) tablet 10 mg  10 mg Oral Daily Thompson, Michael, MD   10 mg at 11/07/23 0719   guaiFENesin  (MUCINEX ) 12 hr tablet 1,200 mg  1,200 mg Oral BID Thompson, Olivia, DO   1,200 mg at 11/07/23 0719   heparin  injection 5,000 Units  5,000 Units Subcutaneous Q8H Thompson, Maria, MD   5,000 Units at 11/07/23 9461   insulin  aspart (novoLOG ) injection 0-5 Units  0-5 Units Subcutaneous QHS Thompson, Olivia, DO   2 Units at 11/05/23 2127   insulin  aspart (novoLOG ) injection 0-9 Units  0-9 Units Subcutaneous TID WC Thompson, Olivia, DO   1 Units at 11/07/23 9255   iron  sucrose (VENOFER ) 500 mg in sodium chloride  0.9 % 250 mL IVPB  500 mg Intravenous Once Thompson, Derek B, MD       linezolid  (ZYVOX ) tablet 600 mg  600 mg Oral Q12H Derek Elsie NOVAK, MD   600 mg at 11/07/23 0719   mupirocin  ointment (BACTROBAN ) 2 %   Nasal BID Derek Elsie NOVAK, MD   Given at 11/07/23 0720   Oral care mouth rinse  15 mL Mouth Rinse PRN Derek Elsie NOVAK, MD       tamsulosin  (FLOMAX ) capsule 0.4 mg  0.4 mg Oral QPC supper Thompson, Michael, MD   0.4 mg at 11/06/23 1608     Discharge Medications: Please see discharge summary for a list of discharge medications.  Relevant Imaging Results:  Relevant Lab Results:   Additional Information [id number]  Derek Thompson, LCSWA

## 2023-11-07 NOTE — Progress Notes (Addendum)
 This chaplain responded to PMT NP-Julia's consult for creating the Pt. HCPOA. The chaplain understands the Pt.is interested in naming his mother and son as co-healthcare agents.   The chaplain introduced herself to the Pt. The chaplain understands the Pt. remains interested in documenting HCPOA but prefers a visit at another time.  This chaplain will plan a revisit to provide AD education and invite goals of care reflection.  Chaplain Leeroy Hummer [phone]

## 2023-11-07 NOTE — TOC Initial Note (Signed)
 Transition of Care [facility]) - Initial/Assessment Note    Patient Details  Name: Derek Thompson MRN: [id number] Date of Birth: 1960/08/25  [facility] CM/SW Contact:    Isaiah Public, LCSWA Phone Number: 11/07/2023, 4:48 PM  Clinical Narrative:                   CSW received consult for possible SNF placement at time of discharge. CSW spoke with patient at bedside regarding PT recommendation of SNF placement at time of discharge. [facility].Patient expressed understanding of PT recommendation and is agreeable to SNF placement at time of discharge. Patient gave CSW permission to fax out initial referral for SNF placement. CSW discussed insurance authorization process. No further questions reported at this time. CSW to continue to follow and assist with discharge planning needs.   Expected Discharge Plan: Skilled Nursing Facility Barriers to Discharge: Continued Medical Work up   Patient Goals and CMS Choice Patient states their goals for this hospitalization and ongoing recovery are:: SNF   Choice offered to / list presented to : Patient      Expected Discharge Plan and Services In-house Referral: Clinical Social Work     Living arrangements for the past 2 months: Homeless Shelter                                      Prior Living Arrangements/Services Living arrangements for the past 2 months: Homeless Shelter Lives with::  (shelter) Patient language and need for interpreter reviewed:: Yes Do you feel safe going back to the place where you live?: No   SNF  Need for Family Participation in Patient Care: Yes (Comment) Care giver support system in place?: Yes (comment)   Criminal Activity/Legal Involvement Pertinent to Current Situation/Hospitalization: No - Comment as needed  Activities of Daily Living   ADL Screening (condition at time of admission) Independently performs ADLs?: Yes (appropriate for  developmental age) Is the patient deaf or have difficulty hearing?: No Does the patient have difficulty seeing, even when wearing glasses/contacts?: Yes Does the patient have difficulty concentrating, remembering, or making decisions?: No  Permission Sought/Granted Permission sought to share information with : Case Manager, Magazine features editor, Family Supports Permission granted to share information with : Yes, Verbal Permission Granted     Permission granted to share info w AGENCY: SNF        Emotional Assessment Appearance:: Appears stated age Attitude/Demeanor/Rapport: Gracious Affect (typically observed): Calm Orientation: : Oriented to Self, Oriented to Place, Oriented to  Time, Oriented to Situation Alcohol / Substance Use: Not Applicable Psych Involvement: No (comment)  Admission diagnosis:  Sinus tachycardia [R00.0] Hyperkalemia [E87.5] Hyponatremia [E87.1] Hypoalbuminemia [E88.09] Hyperglycemia [R73.9] Chronic anemia [D64.9] Generalized weakness [R53.1] AKI (acute kidney injury) (HCC) [N17.9] 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 (HCC) [L97.511] Patient Active Problem List   Diagnosis Date Noted   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 (HCC) 11/04/2023   Chronic anemia 11/04/2023   Generalized weakness 11/03/2023   Sinus tachycardia 11/03/2023   HFrEF (heart failure with reduced ejection fraction) (HCC) 11/02/2023   Chronic osteomyelitis involving lower leg, right (HCC) 11/02/2023   Frailty 11/02/2023   Urinary retention 11/02/2023   Pyelonephritis and Cystitis 10/14/2023   Hyponatremia 10/12/2023   Hyperglycemia 10/12/2023   Tachycardia 10/11/2023   Screening for lung cancer 09/11/2023  Housing instability 09/11/2023   LV (left ventricular) mural thrombus without MI (HCC) 06/19/2023   NSVT (nonsustained ventricular tachycardia) (HCC) 06/19/2023   Mixed hyperlipidemia 06/19/2023   Osteomyelitis of right foot  (HCC) 06/15/2023   AKI (acute kidney injury) (HCC) 06/14/2023   Anemia 06/14/2023   Hyperkalemia 11/22/2022   Iron  deficiency anemia 08/27/2022   MDD (major depressive disorder) 08/27/2022   Malignant neoplasm of prostate (HCC)    CAD (coronary artery disease) 06/10/2020   Hypertension 06/10/2020   DM2 (diabetes mellitus, type 2) (HCC) 06/10/2020   Chronic HFrEF (heart failure with reduced ejection fraction) (HCC)    PCP:  Elicia Sharper, DO Pharmacy:   [id number] [place], [place] - [address] [place] [address] Phone: [phone] Fax: [phone]  [facility] [address] [name] [address] Phone: [phone] Fax: [phone]  [id number] [place], [name] - [address] AT [facility] [address] [name] [address] Phone: [phone] Fax: [phone]     Social Drivers of Health (SDOH) Social History: SDOH Screenings   Food Insecurity: No Food Insecurity (11/02/2023)  Housing: High Risk (11/02/2023)  Transportation Needs: Unmet Transportation Needs (11/02/2023)  Utilities: Not At Risk (11/02/2023)  Alcohol Screen: Low Risk  (08/19/2022)  Depression (PHQ2-9): Low Risk  (10/11/2023)  Financial Resource Strain: Low Risk  (08/19/2022)  Tobacco Use: Medium Risk (11/02/2023)   SDOH Interventions:     Readmission Risk Interventions     No data to display

## 2023-11-07 NOTE — Evaluation (Signed)
 Physical Therapy Evaluation Patient Details Name: Derek Thompson MRN: [id number] DOB: Dec 08, 1960 Today's Date: 11/07/2023  History of Present Illness  Pt is a 63 y/o M presenting to ED on 11/02/23 with lethargy. Pt found to be tachycardic, hyperkalemic, and hyponatremia. Recent hospital stay in June. 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 Pt very lethargy with noted impaired sequencing, delayed response time, disoriented to date, unable to initiate tasks asked and is requiring max directional verbal and tactile cues to transfer. Pt very unsteady and requiring modA for OOB mobility. PTA pt was living at homeless shelter and using RW. At this time recommending inpatient rehab program < 3 hrs a day to achieve safe mod I level of function for safe transition back to shelter.        If plan is discharge home, recommend the following: A lot of help with walking and/or transfers;A lot of help with bathing/dressing/bathroom;Supervision due to cognitive status;Help with stairs or ramp for entrance   Can travel by private vehicle   No    Equipment Recommendations None recommended by PT  Recommendations for Other Services       Functional Status Assessment Patient has had a recent decline in their functional status and/or demonstrates limited ability to make significant improvements in function in a reasonable and predictable amount of time     Precautions / Restrictions Precautions Precautions: Fall Restrictions Weight Bearing Restrictions Per Provider Order: No Other Position/Activity Restrictions: has a post op shoe for R foot      Mobility  Bed Mobility Overal bed mobility: Needs Assistance Bed Mobility: Rolling, Sidelying to Sit, Sit to  Sidelying Rolling: Mod assist Sidelying to sit: Mod assist     Sit to sidelying: Mod assist General bed mobility comments: pt would state okay when given a command but no initiation made requiring max verbal and tactile directional cues and modA for trunk elevation and LE management    Transfers Overall transfer level: Needs assistance Equipment used: Rolling walker (2 wheels) Transfers: Sit to/from Stand Sit to Stand: Mod assist           General transfer comment: pt requiring significant time to participate, would start to lean forward and then collapse elbows down on kness and rub head, with max tactile cues pt able to stand with modA, wide base of support    Ambulation/Gait               General Gait Details: limited to 5 side steps to [facility] with modA to stabilize pt and assist with walker management, pt difficulty sequencing stepping pattern  Stairs            Wheelchair Mobility     Tilt Bed    Modified Rankin (Stroke Patients Only)       Balance Overall balance assessment: Needs assistance Sitting-balance support: Feet supported, Bilateral upper extremity supported Sitting balance-Leahy Scale: Fair Sitting balance - Comments: pt frequently collapsing elbows onto knees   Standing balance support: Bilateral upper extremity supported Standing balance-Leahy Scale: Poor Standing balance comment: reliant on external support                             Pertinent Vitals/Pain Pain Assessment Pain Assessment: No/denies  pain (but then grimaced with L LE movement once sitting EOB)    Home Living Family/patient expects to be discharged to:: Shelter/Homeless                   Additional Comments: Pt reports he has been living at [facility] and plans to return following DC. Pt reports it's one story with a level entry.    Prior Function Prior Level of Function : Independent/Modified Independent             Mobility Comments:  pt reports using a RW ADLs Comments: reports indep     Extremity/Trunk Assessment   Upper Extremity Assessment Upper Extremity Assessment: Generalized weakness    Lower Extremity Assessment Lower Extremity Assessment: Generalized weakness    Cervical / Trunk Assessment Cervical / Trunk Assessment: Normal  Communication   Communication Communication: Impaired Factors Affecting Communication: Reduced clarity of speech    Cognition Arousal: Lethargic Behavior During Therapy: Flat affect   PT - Cognitive impairments: Orientation, Memory, Awareness, Initiation, Sequencing, Problem solving   Orientation impairments: Time (repeatedly stated february despite re-orientation)                   PT - Cognition Comments: pt lethargic and constanting stating okay, give me a minute. Help me Jesus, Noah. Pt with difficulty initiating task, sequencing tasks requiring max verbal and tactile cues, decreased attn span to stay on task, and noted significant processing delay Following commands: Impaired Following commands impaired: Follows one step commands inconsistently     Cueing Cueing Techniques: Verbal cues, Tactile cues     General Comments General comments (skin integrity, edema, etc.): R foot with dressing, noted cloudy urine in catheter bag    Exercises     Assessment/Plan    PT Assessment Patient needs continued PT services  PT Problem List Decreased strength;Decreased activity tolerance;Decreased balance;Decreased mobility;Decreased coordination;Decreased cognition;Decreased safety awareness       PT Treatment Interventions DME instruction;Gait training;Stair training;Functional mobility training;Therapeutic exercise;Therapeutic activities;Balance training;Neuromuscular re-education;Patient/family education;Wheelchair mobility training;Manual techniques;Modalities    PT Goals (Current goals can be found in the Care Plan section)  Acute Rehab PT Goals Patient  Stated Goal: unable to state PT Goal Formulation: Patient unable to participate in goal setting Time For Goal Achievement: 11/21/23 Potential to Achieve Goals: Good    Frequency Min 2X/week     Co-evaluation               AM-PAC PT 6 Clicks Mobility  Outcome Measure Help needed turning from your back to your side while in a flat bed without using bedrails?: A Lot Help needed moving from lying on your back to sitting on the side of a flat bed without using bedrails?: A Lot Help needed moving to and from a bed to a chair (including a wheelchair)?: A Lot Help needed standing up from a chair using your arms (e.g., wheelchair or bedside chair)?: A Lot Help needed to walk in hospital room?: Total Help needed climbing 3-5 steps with a railing? : Total 6 Click Score: 10    End of Session Equipment Utilized During Treatment: Gait belt Activity Tolerance: Patient limited by lethargy Patient left: in bed;with call bell/phone within reach;with bed alarm set Nurse Communication: Mobility status (lethargy) PT Visit Diagnosis: Unsteadiness on feet (R26.81);Difficulty in walking, not elsewhere classified (R26.2)    Time: 8871-8850 PT Time Calculation (min) (ACUTE ONLY): 21 min   Charges:   PT Evaluation $PT Eval Moderate Complexity: 1 Mod  PT General Charges $$ ACUTE PT VISIT: 1 Visit         Norene Ames, PT, DPT Acute Rehabilitation Services Secure chat preferred Office #: [phone]   Norene CHRISTELLA Ames 11/07/2023, 1:26 PM

## 2023-11-08 ENCOUNTER — Institutional Professional Consult (permissible substitution): Admitting: Licensed Clinical Social Worker

## 2023-11-08 ENCOUNTER — Inpatient Hospital Stay (HOSPITAL_COMMUNITY): Payer: MEDICAID

## 2023-11-08 DIAGNOSIS — E875 Hyperkalemia: Secondary | ICD-10-CM | POA: Diagnosis not present

## 2023-11-08 DIAGNOSIS — E871 Hypo-osmolality and hyponatremia: Secondary | ICD-10-CM | POA: Diagnosis not present

## 2023-11-08 DIAGNOSIS — Z7189 Other specified counseling: Secondary | ICD-10-CM | POA: Diagnosis not present

## 2023-11-08 DIAGNOSIS — Z515 Encounter for palliative care: Secondary | ICD-10-CM | POA: Diagnosis not present

## 2023-11-08 DIAGNOSIS — R531 Weakness: Secondary | ICD-10-CM | POA: Diagnosis not present

## 2023-11-08 DIAGNOSIS — N179 Acute kidney failure, unspecified: Secondary | ICD-10-CM | POA: Diagnosis not present

## 2023-11-08 DIAGNOSIS — E8809 Other disorders of plasma-protein metabolism, not elsewhere classified: Secondary | ICD-10-CM | POA: Diagnosis not present

## 2023-11-08 LAB — BASIC METABOLIC PANEL WITH GFR
Anion gap: 5 (ref 5–15)
BUN: 33 mg/dL — ABNORMAL HIGH (ref 8–23)
CO2: 18 mmol/L — ABNORMAL LOW (ref 22–32)
Calcium: 7.9 mg/dL — ABNORMAL LOW (ref 8.9–10.3)
Chloride: 105 mmol/L (ref 98–111)
Creatinine, Ser: 2.21 mg/dL — ABNORMAL HIGH (ref 0.61–1.24)
GFR, Estimated: 32 mL/min — ABNORMAL LOW (ref >60)
Glucose, Bld: 175 mg/dL — ABNORMAL HIGH (ref 70–99)
Potassium: 4.7 mmol/L (ref 3.5–5.1)
Sodium: 128 mmol/L — ABNORMAL LOW (ref 135–145)

## 2023-11-08 LAB — GLUCOSE, CAPILLARY
Glucose-Capillary: 160 mg/dL — ABNORMAL HIGH (ref 70–99)
Glucose-Capillary: 106 mg/dL — ABNORMAL HIGH (ref 70–99)
Glucose-Capillary: 129 mg/dL — ABNORMAL HIGH (ref 70–99)
Glucose-Capillary: 130 mg/dL — ABNORMAL HIGH (ref 70–99)

## 2023-11-08 LAB — CULTURE, BLOOD (ROUTINE X 2)
Culture: NO GROWTH
Culture: NO GROWTH
Special Requests: ADEQUATE
Special Requests: ADEQUATE

## 2023-11-08 LAB — CBC
HCT: 28 % — ABNORMAL LOW (ref 39.0–52.0)
Hemoglobin: 8.9 g/dL — ABNORMAL LOW (ref 13.0–17.0)
MCH: 27.6 pg (ref 26.0–34.0)
MCHC: 31.8 g/dL (ref 30.0–36.0)
MCV: 86.7 fL (ref 80.0–100.0)
Platelets: 303 K/uL (ref 150–400)
RBC: 3.23 MIL/uL — ABNORMAL LOW (ref 4.22–5.81)
RDW: 16.5 % — ABNORMAL HIGH (ref 11.5–15.5)
WBC: 5.8 K/uL (ref 4.0–10.5)
nRBC: 0 % (ref 0.0–0.2)

## 2023-11-08 LAB — KAPPA/LAMBDA LIGHT CHAINS
Kappa free light chain: 79.4 mg/L — ABNORMAL HIGH (ref 3.3–19.4)
Kappa, lambda light chain ratio: 0.58 (ref 0.26–1.65)
Lambda free light chains: 137.7 mg/L — ABNORMAL HIGH (ref 5.7–26.3)

## 2023-11-08 MED ORDER — LACTATED RINGERS IV SOLN: INTRAVENOUS | Status: DC

## 2023-11-08 NOTE — Progress Notes (Signed)
 Palliative Medicine Progress Note   Patient Name: Derek Thompson       Date: 11/08/2023 DOB: 01-24-1961  Age: 63 y.o. MRN#: [id number] Attending Physician: Francesco Elsie NOVAK, MD Primary Care Physician: Elicia Sharper, DO Admit Date: 11/02/2023   HPI/Patient Profile: 63 y.o. 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  Palliative Medicine has been consulted for goals of care discussions.   Subjective: Chart reviewed. Note concern for cefepime  neurotoxicity. PT has evaluated patient and is recommending SNF/rehab.   Patient assessed at bedside. He is in bed, laying on his side with his eyes closed. He is unable to answer questions, and just repeats ok. NT in the room offers him food/drink, and he declines.   Discussed with IM resident Dr. Myrna. Plan is for head CT and to see if there is improvement with stopping the medication.    Objective:  Physical Exam Vitals reviewed.  Constitutional:      General: He is awake. He is not in acute distress.    Appearance: He is ill-appearing.  Pulmonary:     Effort: Pulmonary effort is normal.  Neurological:     Mental Status: He is lethargic and disoriented.             Palliative Medicine Assessment & Plan   Assessment: Principal Problem:   Hyponatremia Active Problems:   CAD (coronary artery disease)   Hyperkalemia   AKI (acute kidney injury) (HCC)   Anemia   LV (left ventricular) mural thrombus without MI (HCC)   HFrEF (heart failure with reduced ejection fraction) (HCC)    Chronic osteomyelitis involving lower leg, right (HCC)   Frailty   Urinary retention   Generalized weakness   Sinus tachycardia   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 (HCC)   Chronic anemia   Hypoalbuminemia    Recommendations/Plan: Continue current scope of care Goal of care is continued treatment and recovery to the extent this is possible Appreciate spiritual care support If patient's mental status does not improve in the next 1-2 days, will reach out to family PMT will  continue to follow   Primary Decision Maker: Patient currently does not capacity for medical decisions During initial consult 7/6, patient stated he would want his mother and son/D'Andrew to make decisions for him if needed   Existing Vynca/ACP Documentation: None   Code Status/Advance Care Planning: Full code  Prognosis:  Unable to determine  Discharge Planning: To Be Determined    Thank you for allowing the Palliative Medicine Team to assist in the care of this patient.   Time: 36 minutes   Recardo KATHEE Loll, NP   Please contact Palliative Medicine Team phone at [phone] for questions and concerns.  For individual providers, please see AMION.

## 2023-11-08 NOTE — Evaluation (Signed)
 Occupational Therapy Evaluation Patient Details Name: Derek Thompson MRN: [id number] DOB: 1960/12/13 Today's Date: 11/08/2023   History of Present Illness   Pt is a 63 y/o M presenting to ED on 11/02/23 with lethargy. Pt found to be tachycardic, hyperkalemic, and hyponatremia. Recent hospital stay in June. PMH: urinary retention, HTN, DM2, CVA in February 2025, anemia, prostate cancer with chronic incontinence following radiation therapy, recent osteomyelitis of the right foot and LV thrombus; s/p R foot 1st and 2nd ray amputation, HFrEF     Clinical Impressions Pt resting in bed with head on arm rail, able to state first name and month of birth after several attempts at asking, otherwise responded only with yes/no with poor comprehension or task initiation. Pt has been staying at [facility] [address], PLOF uses RW and ind with ADLs. Pt currently with poor cognition, only able to follow commands after several tactile cues to initiate movement, max A x2 to assist to EOB and stand at bedside with RW with poor safety awareness. Pt not able to complete functional activities when asked. Overall has fair strength to complete tasks but limited greatly due to cognition. Pt BP remained WNLs during session but HR ranged from 100-153 sitting EOB and standing, Pt unable to express needs or relay any symptoms. Recommending postacute rehab <3hrs/day to maximize functional participation with ADLs/mobility, will continue to see acutely to progress as able.      If plan is discharge home, recommend the following:   Two people to help with walking and/or transfers;A lot of help with bathing/dressing/bathroom;Assistance with cooking/housework;Assistance with feeding;Assist for transportation;Help with stairs or ramp for entrance;Supervision due to cognitive status     Functional Status Assessment   Patient has had a recent decline in their functional status and/or demonstrates limited ability to make significant  improvements in function in a reasonable and predictable amount of time     Equipment Recommendations   None recommended by OT     Recommendations for Other Services         Precautions/Restrictions   Precautions Precautions: Fall Recall of Precautions/Restrictions: Impaired Precaution/Restrictions Comments: L post op shoe Restrictions Weight Bearing Restrictions Per Provider Order: No Other Position/Activity Restrictions: has a post op shoe for R foot     Mobility Bed Mobility Overal bed mobility: Needs Assistance Bed Mobility: Rolling, Supine to Sit, Sit to Supine Rolling: Max assist, +2 for physical assistance   Supine to sit: Max assist, +2 for physical assistance Sit to supine: Max assist, +2 for physical assistance   General bed mobility comments: max A x2 due to poor cognition, overall fair strength but not able to follow commands without strong tactile cueing    Transfers Overall transfer level: Needs assistance Equipment used: Rolling walker (2 wheels) Transfers: Sit to/from Stand Sit to Stand: Max assist, +2 physical assistance, +2 safety/equipment, From elevated surface           General transfer comment: max A x2 with max tactile cues to intiate tasks      Balance Overall balance assessment: Needs assistance Sitting-balance support: Bilateral upper extremity supported, Feet supported Sitting balance-Leahy Scale: Fair Sitting balance - Comments: no physical assist, due to cognition tries to lie down frequently Postural control: Posterior lean Standing balance support: Single extremity supported, During functional activity, Reliant on assistive device for balance Standing balance-Leahy Scale: Poor Standing balance comment: poor due to decreased cognition  ADL either performed or assessed with clinical judgement   ADL Overall ADL's : Needs assistance/impaired                                        General ADL Comments: total A at bed level, max tactile cueing to follow commands, not able to follow verbal commands     Vision         Perception         Praxis         Pertinent Vitals/Pain Pain Assessment Pain Assessment: Faces Faces Pain Scale: Hurts little more Pain Location: LLE with movement Pain Descriptors / Indicators: Discomfort, Grimacing, Guarding Pain Intervention(s): Monitored during session     Extremity/Trunk Assessment Upper Extremity Assessment Upper Extremity Assessment: Difficult to assess due to impaired cognition           Communication Communication Communication: Impaired Factors Affecting Communication: Difficulty expressing self   Cognition Arousal: Obtunded, Stuporous Behavior During Therapy: Flat affect Cognition: Cognition impaired   Orientation impairments: Person, Place, Time, Situation Awareness: Intellectual awareness impaired, Online awareness impaired     Executive functioning impairment (select all impairments): Sequencing, Reasoning, Problem solving, Organization, Initiation OT - Cognition Comments: Pt very confused, only able to follow strong tactile cues to initiate task after multiple attempts, responded with first name only after asking 5X, and stated month/day of birth after several attempts, otherwise said yes/no to verbal commands without follow through or comprehension                 Following commands: Impaired Following commands impaired: Follows one step commands inconsistently     Cueing  General Comments   Cueing Techniques: Verbal cues;Tactile cues;Gestural cues;Visual cues      Exercises     Shoulder Instructions      Home Living Family/patient expects to be discharged to:: Shelter/Homeless                                 Additional Comments: poor historian      Prior Functioning/Environment Prior Level of Function : Independent/Modified Independent              Mobility Comments: pt reports using a RW ADLs Comments: reports indep    OT Problem List: Decreased strength;Decreased activity tolerance;Impaired balance (sitting and/or standing);Decreased knowledge of use of DME or AE;Decreased cognition;Decreased safety awareness;Pain   OT Treatment/Interventions: Self-care/ADL training;Therapeutic exercise;Energy conservation;DME and/or AE instruction;Therapeutic activities;Patient/family education;Balance training      OT Goals(Current goals can be found in the care plan section)   Acute Rehab OT Goals Patient Stated Goal: unable to participate in setting goals OT Goal Formulation: Patient unable to participate in goal setting Time For Goal Achievement: 11/22/23 Potential to Achieve Goals: Fair   OT Frequency:  Min 2X/week    Co-evaluation              AM-PAC OT 6 Clicks Daily Activity     Outcome Measure Help from another person eating meals?: Total Help from another person taking care of personal grooming?: Total Help from another person toileting, which includes using toliet, bedpan, or urinal?: Total Help from another person bathing (including washing, rinsing, drying)?: Total Help from another person to put on and taking off regular upper body clothing?: Total Help from another person to put on and taking  off regular lower body clothing?: Total 6 Click Score: 6   End of Session Equipment Utilized During Treatment: Gait belt;Rolling walker (2 wheels) Nurse Communication: Mobility status  Activity Tolerance: Other (comment) (limited due to decreased cognition) Patient left: in bed;with call bell/phone within reach;with bed alarm set  OT Visit Diagnosis: Unsteadiness on feet (R26.81);Other abnormalities of gait and mobility (R26.89);Muscle weakness (generalized) (M62.81);Other symptoms and signs involving cognitive function;Pain Pain - Right/Left: Right Pain - part of body: Ankle and joints of foot                Time:  8871-8846 OT Time Calculation (min): 25 min Charges:  OT General Charges $OT Visit: 1 Visit OT Evaluation $OT Eval Moderate Complexity: 1 Mod OT Treatments $Therapeutic Activity: 8-22 mins  [address] OTR/L   [name] 11/08/2023, 12:14 PM

## 2023-11-08 NOTE — TOC Progression Note (Addendum)
 Transition of Care [facility] - Progression Note    Patient Details  Name: Derek Thompson MRN: [id number] Date of Birth: Dec 22, 1960  Transition of Care [facility]) CM/SW Contact  Isaiah Public, LCSWA Phone Number: 11/08/2023, 2:00 PM  Clinical Narrative:     CSW Lvm for [place] with [facility] and rehab. CSW awaiting call back to see if they can offer. [facility] currently considering in hub. Patient has no other SNF offers currently. CSW will continue to follow.   Update- CSW received call back from [place] with [facility] who informed CSW facility currently does not have any bed availability and will need to check on patients benefits before confirming facility can offer. Damien plans to follow up with CSW as soon as she is able to check patients benefits.   Expected Discharge Plan: Skilled Nursing Facility Barriers to Discharge: Continued Medical Work up  Expected Discharge Plan and Services In-house Referral: Clinical Social Work     Living arrangements for the past 2 months: Homeless Shelter                                       Social Determinants of Health (SDOH) Interventions SDOH Screenings   Food Insecurity: No Food Insecurity (11/02/2023)  Housing: High Risk (11/02/2023)  Transportation Needs: Unmet Transportation Needs (11/02/2023)  Utilities: Not At Risk (11/02/2023)  Alcohol Screen: Low Risk  (08/19/2022)  Depression (PHQ2-9): Low Risk  (10/11/2023)  Financial Resource Strain: Low Risk  (08/19/2022)  Tobacco Use: Medium Risk (11/02/2023)    Readmission Risk Interventions     No data to display

## 2023-11-08 NOTE — Progress Notes (Signed)
 HD#5 SUBJECTIVE:  Patient Summary: Derek Thompson is a 63 y.o. with a pertinent PMH of End-stage HFrEF, CKD, CVA, bladder outlet obstruction s/p coude foley catheter during last admission 10/2023, and chronic osteomyelitis , admitted hypovolemic hyponatremia and AKI with coude foley and on CTX, improving  Overnight Events: Overnight, pt became tachycardic and tachypneic. His cough from the night before had improved. Metoprolol  12.5 given x2 doses with improvement of tachycardia.  Interim History:  Pt seen and examined at the bedside this morning. He was less alert than yesterday, not able to follow instructions, and would not open eyes if asked. This is a drastic change from earlier in the week .   OBJECTIVE:  Vital Signs: Vitals:   11/07/23 1635 11/07/23 2306 11/08/23 0607 11/08/23 0808  BP: 112/78 (!) 104/57 113/71 109/73  Pulse: 88 (!) 105 98 97  Resp: 18 18 18    Temp: 98.4 F (36.9 C) 98.4 F (36.9 C) 98.2 F (36.8 C) 98.6 F (37 C)  TempSrc: Oral   Axillary  SpO2: 100% 100% 99% 99%  Weight:      Height:       Supplemental O2: Room Air SpO2: 99 %  Filed Weights   11/02/23 1234 11/02/23 1545  Weight: 70.3 kg 69.2 kg     Intake/Output Summary (Last 24 hours) at 11/08/2023 1140 Last data filed at 11/08/2023 0555 Gross per 24 hour  Intake 292.07 ml  Output 1100 ml  Net -807.93 ml   Net IO Since Admission: -2,325.13 mL [11/08/23 1140]  Physical Exam: Const: Awake, alert in NAD HENT: Normocephalic, atraumatic, Card: RRR, No MRG, No pitting edema on LE's bilaterally  Resp: Decreased at the bases. Abd: Soft, NTND, Extremities: Warm. RLE bandage in place over foot with discoloration of the toes.  Neuro: unable to participate and follow instructions.   Patient Lines/Drains/Airways Status     Active Line/Drains/Airways     Name Placement date Placement time Site Days   Peripheral IV 11/03/23 22 G 1.75 Left;Proximal Forearm 11/03/23  0945  Forearm  1    Peripheral IV 11/04/23 20 G 1 Posterior;Right Forearm 11/04/23  0845  Forearm  less than 1   Urethral Catheter Mekides Nida, RN Coude 16 Fr. 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, reddened foot area with purulent drainage present 09/01/23  1721  Foot  63   Wound 11/02/23 1655 Vascular Ulcer Foot Anterior;Right 11/02/23  1655  Foot  2   Wound 11/02/23 1658 Vascular Ulcer Foot Right;Lateral 11/02/23  1658  Foot  2            Pertinent labs and imaging:      Latest Ref Rng & Units 11/08/2023    4:38 AM 11/07/2023   10:20 AM 11/06/2023    4:33 AM  CBC  WBC 4.0 - 10.5 K/uL 5.8  6.2  6.1   Hemoglobin 13.0 - 17.0 g/dL 8.9  8.7  8.2   Hematocrit 39.0 - 52.0 % 28.0  27.8  25.9   Platelets 150 - 400 K/uL 303  298  303        Latest Ref Rng & Units 11/08/2023    4:38 AM 11/07/2023   10:20 AM 11/06/2023    4:33 AM  CMP  Glucose 70 - 99 mg/dL 824  825  96   BUN 8 - 23 mg/dL 33  42  41   Creatinine 0.61 - 1.24 mg/dL 7.78  7.90  7.95   Sodium 135 - 145 mmol/L 128  131  126   Potassium 3.5 - 5.1 mmol/L 4.7  4.8  4.6   Chloride 98 - 111 mmol/L 105  104  99   CO2 22 - 32 mmol/L 18  16  17    Calcium  8.9 - 10.3 mg/dL 7.9  8.4  8.2   Total Protein 6.5 - 8.1 g/dL   6.7   Total Bilirubin 0.0 - 1.2 mg/dL   0.7   Alkaline Phos 38 - 126 U/L   46   AST 15 - 41 U/L   34   ALT 0 - 44 U/L   33    No results found.   ASSESSMENT/PLAN:  Assessment: Principal Problem:   Hyponatremia Active Problems:   CAD (coronary artery disease)   Hyperkalemia   AKI (acute kidney injury) (HCC)   Anemia   LV (left ventricular) mural thrombus without MI (HCC)   HFrEF (heart failure with reduced ejection fraction) (HCC)   Chronic osteomyelitis involving lower leg, right (HCC)   Frailty   Urinary retention   Generalized weakness   Sinus tachycardia   Chronic ulcer of right foot limited to  breakdown of skin (HCC)   Chronic anemia   Hypoalbuminemia   Plan:  Sepsis Possible PNA Cefepime  neurotoxicity Chronic osteomyelitis of the R foot vs PNA. Urine culture negative, so do not suspect urinary source at this time.  -Due to low suspicion of PNA, will change abx to augmentin  and d/c cefepime  and azithromycin . BC NGTD, Urine Culture negative. Resp viral panel neg. MRSA PCR pos--low suspicion for MRSA PNA given no fever and normal WBC. Suspect tachypnea and respiratory distress this admission is of cardiac origin and pulmonary edema. Respiratory status continuing to improve.   Encephalopathy Delirium -Pt more delirious today on exam. Will not follow instructions and is unable to participate in the conversation. Unable to participate in a neuro exam today. Will obtain Head CT.   AKI Bladder outlet obstruction with BL hydroureter UTI NAGMA Since admission, AKI has significantly improved with BUN 33 and CR 2.21 today. Significantly increased urine output today again. Possible post ATN diuresis. Will keep coude catheter in place. Urine culture negative. Serum bicarb 15 today with AG 11. He has been persistently acidotic since admission, likely due to renal injury. Will continue to monitor and give maintenance fluids due to increased diuresis and decreased PO intake.   Hyponatremia Na 128. Likely multifactorial, including cardiorenal syndrome, AKI, bladder outlet obstruction, and hypovolemia. Could potentially be due to pseudohyponatremia due to protein gap and elevated M-Spike. Pt continues to mildly improve with fluids, but this is not sustained.   Elevated Protein gap  SPEP with Positive M-Spike SPEP with gamma globulin 2.1 and Mspike 1.1. At this time, concern for multiple myeloma due to M-spike and 2/4 CRAB criteria. Serum light chains, immunofixation, and urine protein spending. This could also be contributing to hyponatremia in form of a pseudohyponatremia. Will await further  results.   Biventricular HFrEF EF 20-25%.  Hx of nonobstructive CAD. Nonadherent to medications. Not a candidate for advanced therapies. Suspicion of prior MI but none documented in the chart. Remains peripherally euvolemic, however concern for pulmonary edema with minimal crackles bilaterally and episodes of increased work of breathing. Pt has not had anymore episodes of tachypnea or orthopnea - Monitor electrolytes; goal Mg 2, K ~4. - Monitor urine output.  Hx of LV  thrombus (not visualized on TTE and MR 10/2023) New subscapular splenic infarct Nonadherent to Eliquis  at home. Eliquis  held due to downtrending Hgb. Will continue to hold due to anemia and fluctuating Hgb. Continue subcutaneous heparin  for VTE prophylaxis. Trend Hgb  Acute on chronic anemia Low sat ratios during last hospitalization. BC NGTD, little to no concern for bacteremia. IV iron  sucrose 500mg  transfused yesterday. Will continue daily CBC's. Transfuse if Hbg <7  Chronic osteomyelitis R foot PAD Pt has long history of persistent RLE osteomyelitis. This hospitalization it has continued to worsen. He was not on medications prior to admission and has not wished to treat it in the past. Palliative care saw pt and pt expressed desire to continue to be full code, full scope. Ortho recommended BKA which previous pt was consenting to. Pt unable to give consent today due to delirium. Will hold off on consulting ortho at this time.   Multisystem organ failure Goals of care Palliative Consult Severe protein calorie malnutrition Discussed end stage HFrEF and recurrent hospitalizations. At this time, multi system organ failure including cardiac, renal, respiratory, and neuro.  -Palliative to see pt today and will discuss goals of care with patient afterwards.  Long conversation had yesterday and pt expressed multiple times that he would like to break this cycle and stop being in the hospital. Discussed again his poor health overall  and that he will continue this cycle based on the course he is choosing with aggressive inpatient care and noncompliance outside the hospital. Also consulted chaplain to continue conversations. At this time, he wishes to continue aggressive care including possible BKA.   -Per PT eval, pt to SNF at discharge.   Best Practice: Diet: Renal diet IVF: Fluids: LR, Rate: 18mL/hr for 10hrs VTE: heparin  injection 5,000 Units Start: 11/05/23 1400Holding Eliquis  on 7/5 Code: Full  Disposition planning: Therapy Recs: Pending, DME: other pending DISPO: Anticipated discharge in 2-3 days to pending PT eval pending clinical improvement.  Signature:  Schuyler Novak, DO [facility]  11:40 AM, 11/08/2023  On Call pager [phone]

## 2023-11-08 NOTE — Progress Notes (Signed)
 This chaplain is present for F/U spiritual care in the setting of completing HCPOA and F/U after Pt.'s goals of care discussion. This chaplain reviewed the Pt. chart notes and received an update from the unit charge nurse before today's visit.  The chaplain understands from the Chg RN, the Pt. ability to communicate has declined since admission. Yesterday's chaplain visit with the Pt. re-affirmed the Pt. choice to name mother and son as shared healthcare agents.   The chaplain will connect with PMT NP-Julia for a medical update before revisiting for spiritual care.  Chaplain Leeroy Hummer [phone]

## 2023-11-09 ENCOUNTER — Telehealth (HOSPITAL_COMMUNITY): Payer: Self-pay | Admitting: Internal Medicine

## 2023-11-09 DIAGNOSIS — Z789 Other specified health status: Secondary | ICD-10-CM

## 2023-11-09 DIAGNOSIS — Z7189 Other specified counseling: Secondary | ICD-10-CM | POA: Diagnosis not present

## 2023-11-09 DIAGNOSIS — Z515 Encounter for palliative care: Secondary | ICD-10-CM | POA: Diagnosis not present

## 2023-11-09 DIAGNOSIS — E871 Hypo-osmolality and hyponatremia: Secondary | ICD-10-CM | POA: Diagnosis not present

## 2023-11-09 LAB — IMMUNOFIXATION ELECTROPHORESIS
IgA: 204 mg/dL (ref 61–437)
IgG (Immunoglobin G), Serum: 2605 mg/dL — ABNORMAL HIGH (ref 603–1613)
IgM (Immunoglobulin M), Srm: 36 mg/dL (ref 20–172)
Total Protein ELP: 6.4 g/dL (ref 6.0–8.5)

## 2023-11-09 LAB — BASIC METABOLIC PANEL WITH GFR
Anion gap: 7 (ref 5–15)
BUN: 30 mg/dL — ABNORMAL HIGH (ref 8–23)
CO2: 18 mmol/L — ABNORMAL LOW (ref 22–32)
Calcium: 8.6 mg/dL — ABNORMAL LOW (ref 8.9–10.3)
Chloride: 107 mmol/L (ref 98–111)
Creatinine, Ser: 2.05 mg/dL — ABNORMAL HIGH (ref 0.61–1.24)
GFR, Estimated: 36 mL/min — ABNORMAL LOW (ref >60)
Glucose, Bld: 118 mg/dL — ABNORMAL HIGH (ref 70–99)
Potassium: 4.6 mmol/L (ref 3.5–5.1)
Sodium: 132 mmol/L — ABNORMAL LOW (ref 135–145)

## 2023-11-09 LAB — CBC
HCT: 28.5 % — ABNORMAL LOW (ref 39.0–52.0)
Hemoglobin: 9 g/dL — ABNORMAL LOW (ref 13.0–17.0)
MCH: 27.5 pg (ref 26.0–34.0)
MCHC: 31.6 g/dL (ref 30.0–36.0)
MCV: 87.2 fL (ref 80.0–100.0)
Platelets: 286 K/uL (ref 150–400)
RBC: 3.27 MIL/uL — ABNORMAL LOW (ref 4.22–5.81)
RDW: 16.8 % — ABNORMAL HIGH (ref 11.5–15.5)
WBC: 6.1 K/uL (ref 4.0–10.5)
nRBC: 0 % (ref 0.0–0.2)

## 2023-11-09 LAB — IGG, IGA, IGM
IgA: 216 mg/dL (ref 61–437)
IgG (Immunoglobin G), Serum: 2664 mg/dL — ABNORMAL HIGH (ref 603–1613)
IgM (Immunoglobulin M), Srm: 41 mg/dL (ref 20–172)

## 2023-11-09 LAB — GLUCOSE, CAPILLARY
Glucose-Capillary: 143 mg/dL — ABNORMAL HIGH (ref 70–99)
Glucose-Capillary: 155 mg/dL — ABNORMAL HIGH (ref 70–99)
Glucose-Capillary: 125 mg/dL — ABNORMAL HIGH (ref 70–99)
Glucose-Capillary: 244 mg/dL — ABNORMAL HIGH (ref 70–99)

## 2023-11-09 NOTE — Plan of Care (Signed)

## 2023-11-09 NOTE — Progress Notes (Signed)
 Palliative Medicine Progress Note   Patient Name: Derek Thompson       Date: 11/09/2023 DOB: August 29, 1960  Age: 63 y.o. MRN#: [id number] Attending Physician: Francesco Elsie NOVAK, MD Primary Care Physician: Elicia Sharper, DO Admit Date: 11/02/2023   HPI/Patient Profile: 63 y.o. 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  Palliative Medicine has been consulted for goals of care discussions.   Subjective: Chart reviewed. Note CT head did not show any acute abnormalities. Patient has been evaluated by PT with recommendation for short-term rehab.   Patient assessed. He is alert and sitting up in bed eating lunch. His mental status has much improved from yesterday. He is oriented and denies acute complaints.   Per patient's request, I spoke with his mom and brother on the phone while in the room. They report that they received a thorough update earlier from the medical team. They report they are trying to process the information, and seem to understand seriousness of patient's current medical situation. We discussed advanced directives - mom reports she is patient's medical POA and offers to send this document to me by email.    Objective:  Physical Exam Vitals reviewed.  Constitutional:      General: He is not in acute distress.    Comments: Chronically ill-appearing  Pulmonary:     Effort: Pulmonary effort is normal.  Neurological:     Mental Status: He is alert and oriented to person, place, and time.             Palliative  Medicine Assessment & Plan   Assessment: Principal Problem:   Hyponatremia Active Problems:   CAD (coronary artery disease)   Hyperkalemia   AKI (acute kidney injury) (HCC)   Anemia   LV (left ventricular) mural thrombus without MI (HCC)   HFrEF (heart failure with reduced ejection fraction) (HCC)   Chronic osteomyelitis involving lower leg, right (HCC)   Frailty   Urinary retention   Generalized weakness   Sinus tachycardia   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 (HCC)   Chronic anemia  Hypoalbuminemia    Recommendations/Plan: Continue current scope of care Goal of care is continued treatment and recovery to the extent this is possible HCPOA document has been reviewed and will be scanned into EMR PMT will continue to follow   Primary Decision Maker: PATIENT   Advanced Directives: HCPOA document naming Derek Thompson (mom) as primary decision maker and Derek Thompson (son) as alternate Management consultant, in the event patient is unable to make medical decisions for himself.    Code Status/Advance Care Planning: Full code  Prognosis:  Unable to determine  Discharge Planning: SNF/rehab   Thank you for allowing the Palliative Medicine Team to assist in the care of this patient.   Time: 36 minutes   Recardo KATHEE Loll, NP   Please contact Palliative Medicine Team phone at [phone] for questions and concerns.  For individual providers, please see AMION.

## 2023-11-09 NOTE — Progress Notes (Signed)
 Physical Therapy Treatment Patient Details Name: Derek Thompson MRN: [id number] DOB: 05-06-1960 Today's Date: 11/09/2023   History of Present Illness Pt is a 63 y/o M presenting to ED on 11/02/23 with lethargy. Pt found to be tachycardic, hyperkalemic, and hyponatremia. Recent hospital stay in June. PMH: urinary retention, HTN, DM2, CVA in February 2025, anemia, prostate cancer with chronic incontinence following radiation therapy, recent osteomyelitis of the right foot and LV thrombus; s/p R foot 1st and 2nd ray amputation, HFrEF    PT Comments  Pt supine in bed on arrival this session.  Pt more alert and required decreased assistance.  He continues to present with slow processing and required increased time for all activities.  Pt gt speed is reduced which is indicative of falls.  Will continue to follow for PT to improve strength and function.  He continues to benefit from aggressive rehab in a post acute setting.      If plan is discharge home, recommend the following: A lot of help with walking and/or transfers;A lot of help with bathing/dressing/bathroom;Supervision due to cognitive status;Help with stairs or ramp for entrance   Can travel by private vehicle     No  Equipment Recommendations  None recommended by PT    Recommendations for Other Services       Precautions / Restrictions Precautions Precautions: Fall Precaution/Restrictions Comments: R post op shoe Required Braces or Orthoses: Other Brace (darco post op shoe on R side) Restrictions Weight Bearing Restrictions Per Provider Order: No Other Position/Activity Restrictions: has a post op shoe for R foot     Mobility  Bed Mobility Overal bed mobility: Needs Assistance Bed Mobility: Supine to Sit       Sit to supine: Supervision   General bed mobility comments: Increased time to follow commands and move to the edge of bed.    Transfers Overall transfer level: Needs assistance Equipment used: Rolling walker  (2 wheels) Transfers: Sit to/from Stand Sit to Stand: Min assist           General transfer comment: Cues for hand placement to and from seated surface.    Ambulation/Gait Ambulation/Gait assistance: Min assist Gait Distance (Feet): 40 Feet Assistive device: Rolling walker (2 wheels) Gait Pattern/deviations: Decreased stride length, Decreased step length - right, Decreased stance time - right, Step-to pattern, Trunk flexed Gait velocity: reduced     General Gait Details: Note R knee hyperextension in stance phase.  Pt with mild limb length descrepancy from his darco shoe. He is very slow to ambulate and require max verbals cues for sequencing and position of RW.  Very slow processing observed.   Stairs             Wheelchair Mobility     Tilt Bed    Modified Rankin (Stroke Patients Only)       Balance Overall balance assessment: Needs assistance Sitting-balance support: Bilateral upper extremity supported, Feet supported Sitting balance-Leahy Scale: Fair Sitting balance - Comments: no physical assist, due to cognition tries to lie down frequently Postural control: Posterior lean Standing balance support: Single extremity supported, During functional activity, Reliant on assistive device for balance Standing balance-Leahy Scale: Poor Standing balance comment: poor due to decreased cognition                            Communication Communication Communication: Impaired Factors Affecting Communication: Difficulty expressing self  Cognition Arousal: Alert Behavior During Therapy: Flat affect  PT - Cognitive impairments: Orientation, Memory, Awareness, Initiation, Sequencing, Problem solving                       PT - Cognition Comments: Pt very flat, limited verbal interaction this session but more mobile. Following commands: Impaired Following commands impaired: Follows one step commands with increased time    Cueing Cueing  Techniques: Verbal cues, Tactile cues, Gestural cues, Visual cues  Exercises      General Comments        Pertinent Vitals/Pain Pain Assessment Pain Assessment: Faces Pain Location: LLE with movement Pain Descriptors / Indicators: Discomfort, Grimacing, Guarding Pain Intervention(s): Monitored during session, Repositioned    Home Living                          Prior Function            PT Goals (current goals can now be found in the care plan section) Acute Rehab PT Goals Patient Stated Goal: unable to state Potential to Achieve Goals: Good Progress towards PT goals: Progressing toward goals    Frequency    Min 2X/week      PT Plan      Co-evaluation              AM-PAC PT 6 Clicks Mobility   Outcome Measure  Help needed turning from your back to your side while in a flat bed without using bedrails?: A Lot Help needed moving from lying on your back to sitting on the side of a flat bed without using bedrails?: A Lot Help needed moving to and from a bed to a chair (including a wheelchair)?: A Lot Help needed standing up from a chair using your arms (e.g., wheelchair or bedside chair)?: A Lot Help needed to walk in hospital room?: Total Help needed climbing 3-5 steps with a railing? : Total 6 Click Score: 10    End of Session Equipment Utilized During Treatment: Gait belt Activity Tolerance: Patient tolerated treatment well Patient left: in chair;with call bell/phone within reach;with chair alarm set Nurse Communication: Mobility status PT Visit Diagnosis: Unsteadiness on feet (R26.81);Difficulty in walking, not elsewhere classified (R26.2) Pain - Right/Left: Right Pain - part of body: Ankle and joints of foot     Time: 8795-8765 PT Time Calculation (min) (ACUTE ONLY): 30 min  Charges:    $Gait Training: 8-22 mins $Therapeutic Activity: 8-22 mins PT General Charges $$ ACUTE PT VISIT: 1 Visit                     Toya HAMS ,  PTA Acute Rehabilitation Services Office [phone]    Toya JINNY Gosling 11/09/2023, 12:52 PM

## 2023-11-09 NOTE — TOC Progression Note (Addendum)
 Transition of Care [facility]) - Progression Note    Patient Details  Name: [name] MRN: [id number] Date of Birth: 1960/08/25  Transition of Care [facility]) CM/SW Contact  Isaiah Public, LCSWA Phone Number: 11/09/2023, 11:40 AM  Clinical Narrative:     Due to patients current orientation CSW Lvm for patients mother [name]. CSW awaiting call back to discuss patients SNF bed offers.CSW will continue to follow.   Expected Discharge Plan: Skilled Nursing Facility Barriers to Discharge: Continued Medical Work up  Expected Discharge Plan and Services In-house Referral: Clinical Social Work     Living arrangements for the past 2 months: Homeless Shelter                                       Social Determinants of Health (SDOH) Interventions SDOH Screenings   Food Insecurity: No Food Insecurity (11/02/2023)  Housing: High Risk (11/02/2023)  Transportation Needs: Unmet Transportation Needs (11/02/2023)  Utilities: Not At Risk (11/02/2023)  Alcohol Screen: Low Risk  (08/19/2022)  Depression (PHQ2-9): Low Risk  (10/11/2023)  Financial Resource Strain: Low Risk  (08/19/2022)  Tobacco Use: Medium Risk (11/02/2023)    Readmission Risk Interventions     No data to display

## 2023-11-09 NOTE — Progress Notes (Signed)
 HD#6 SUBJECTIVE:  Patient Summary: Derek Thompson is a 63 y.o. with a pertinent PMH of End-stage HFrEF, CKD, CVA, bladder outlet obstruction s/p coude foley catheter during last admission 10/2023, and chronic osteomyelitis , admitted hypovolemic hyponatremia and AKI with coude foley and on CTX, improving  Overnight Events: Overnight, pt became tachycardic and tachypneic. His cough from the night before had improved. Metoprolol  12.5 given x2 doses with improvement of tachycardia.  Interim History:  Pt seen and examined at the bedside this morning. He was much more alert and was able to engage in conversation and answer orientation questions. His mom and brother were at the bedside and a long conversation was had regarding hospital course and continued plans. All questions and concerns were addressed at this time.   OBJECTIVE:  Vital Signs: Vitals:   11/08/23 1719 11/08/23 2038 11/09/23 0446 11/09/23 0715  BP: 116/76 121/81 106/63 110/77  Pulse: (!) 102 (!) 118 (!) 111 (!) 110  Resp: 18 18 18 18   Temp: 98.4 F (36.9 C) 99.3 F (37.4 C) 97.7 F (36.5 C) (!) 97.5 F (36.4 C)  TempSrc: Oral Axillary Oral Oral  SpO2: 100% 99% 98%   Weight:      Height:       Supplemental O2: Room Air SpO2: 98 %  Filed Weights   11/02/23 1234 11/02/23 1545  Weight: 70.3 kg 69.2 kg     Intake/Output Summary (Last 24 hours) at 11/09/2023 1125 Last data filed at 11/09/2023 0900 Gross per 24 hour  Intake --  Output 1650 ml  Net -1650 ml   Net IO Since Admission: -3,975.13 mL [11/09/23 1125]  Physical Exam: Const: Awake, alert in NAD HENT: Normocephalic, atraumatic, Card: RRR, No MRG, No pitting edema on LE's bilaterally  Resp: Decreased at the bases. Abd: Soft, NTND, Extremities: Warm. RLE bandage in place over foot with discoloration of the toes.  Neuro: unable to participate and follow instructions.   Patient Lines/Drains/Airways Status     Active Line/Drains/Airways     Name  Placement date Placement time Site Days   Peripheral IV 11/03/23 22 G 1.75 Left;Proximal Forearm 11/03/23  0945  Forearm  1   Peripheral IV 11/04/23 20 G 1 Posterior;Right Forearm 11/04/23  0845  Forearm  less than 1   Urethral Catheter Mekides Nida, RN Coude 16 Fr. 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, reddened foot area with purulent drainage present 09/01/23  1721  Foot  64   Wound 11/02/23 1655 Vascular Ulcer Foot Anterior;Right 11/02/23  1655  Foot  2   Wound 11/02/23 1658 Vascular Ulcer Foot Right;Lateral 11/02/23  1658  Foot  2            Pertinent labs and imaging:      Latest Ref Rng & Units 11/09/2023    5:53 AM 11/08/2023    4:38 AM 11/07/2023   10:20 AM  CBC  WBC 4.0 - 10.5 K/uL 6.1  5.8  6.2   Hemoglobin 13.0 - 17.0 g/dL 9.0  8.9  8.7   Hematocrit 39.0 - 52.0 % 28.5  28.0  27.8   Platelets 150 - 400 K/uL 286  303  298        Latest Ref Rng & Units 11/09/2023    5:53 AM 11/08/2023    4:38 AM 11/07/2023   10:20 AM  CMP  Glucose 70 -  99 mg/dL 881  824  825   BUN 8 - 23 mg/dL 30  33  42   Creatinine 0.61 - 1.24 mg/dL 7.94  7.78  7.90   Sodium 135 - 145 mmol/L 132  128  131   Potassium 3.5 - 5.1 mmol/L 4.6  4.7  4.8   Chloride 98 - 111 mmol/L 107  105  104   CO2 22 - 32 mmol/L 18  18  16    Calcium  8.9 - 10.3 mg/dL 8.6  7.9  8.4    CT HEAD WO CONTRAST ( ) Result Date: 11/08/2023 CLINICAL DATA:  Encephalopthy EXAM: CT HEAD WITHOUT CONTRAST TECHNIQUE: Contiguous axial images were obtained from the base of the skull through the vertex without intravenous contrast. RADIATION DOSE REDUCTION: This exam was performed according to the departmental dose-optimization program which includes automated exposure control, adjustment of the mA and/or kV according to patient size and/or use of iterative reconstruction technique. COMPARISON:  MRI of the head dated  June 14, 2023 and CT of the head dated June 13, 2023. FINDINGS: Brain: Age-related atrophy and chronic encephalomalacia changes within the right parietal lobe. Chronic lacunar infarct within the right caudate. Chronic infarcts are also noted within the cerebellar hemispheres bilaterally. No evidence of hemorrhage, mass or acute cortical infarction. Vascular: Atheromatous calcifications within the carotid siphons. Skull: Intact and unremarkable. Sinuses/Orbits: No acute process. Other: None. IMPRESSION: 1. Chronic encephalomalacia changes within the right parietal lobe and cerebellar hemispheres bilaterally, with mild-to-moderate chronic cerebral white matter disease. No apparent acute process. Electronically Signed   By: Evalene Coho M.D.   On: 11/08/2023 16:27     ASSESSMENT/PLAN:  Assessment: Principal Problem:   Hyponatremia Active Problems:   CAD (coronary artery disease)   Hyperkalemia   AKI (acute kidney injury) (HCC)   Anemia   LV (left ventricular) mural thrombus without MI (HCC)   HFrEF (heart failure with reduced ejection fraction) (HCC)   Chronic osteomyelitis involving lower leg, right (HCC)   Frailty   Urinary retention   Generalized weakness   Sinus tachycardia   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 (HCC)   Chronic anemia   Hypoalbuminemia   Plan:  Sepsis Possible PNA Cefepime  neurotoxicity Chronic osteomyelitis of the R foot vs PNA. Urine culture negative, so do not suspect urinary source at this time.  -Due to low suspicion of PNA, will change abx to augmentin  and d/c cefepime  and azithromycin . BC NGTD, Urine Culture negative. Resp viral panel neg. MRSA PCR pos--low suspicion for MRSA PNA given no fever and normal WBC. Suspect tachypnea and respiratory distress this admission is of cardiac origin and pulmonary edema. Respiratory status continuing to improve.   Encephalopathy Delirium -Significantly improved today. Likely cefepime   induced.   AKI Bladder outlet obstruction with BL hydroureter UTI NAGMA Since admission, AKI has significantly improved with BUN 30 and CR 2.05 today. Continued increased urine output today again. Possible post ATN diuresis. Will keep coude catheter in place. Urine culture negative. Serum bicarb 18 today with AG 7. He has been persistently acidotic since admission, likely due to renal injury. Will hold fluids today as pts oral intake has been adequate.   Hyponatremia Na 132. Likely multifactorial, including cardiorenal syndrome, AKI, bladder outlet obstruction, and hypovolemia. Could potentially be due to pseudohyponatremia due to protein gap and elevated M-Spike. Pt continues to mildly improve with fluids, but this is not sustained.   Elevated Protein gap  SPEP with Positive M-Spike SPEP with gamma  globulin 2.1 and Mspike 1.1. Kappa/Lambda light chain ratio 0.58, decreasing concern for multiple myeloma. IgG elevated at 2,664, possibly due to chronic illness and recurrent infections. Urine proteins pending. This could also be contributing to hyponatremia in form of a pseudohyponatremia. Will await further results.   Biventricular HFrEF EF 20-25%.  Hx of nonobstructive CAD. Nonadherent to medications. Not a candidate for advanced therapies. Suspicion of prior MI but none documented in the chart. Remains peripherally euvolemic. Pt has not had anymore episodes of tachypnea or orthopnea - Monitor electrolytes; goal Mg 2, K ~4. - Monitor urine output.  Hx of LV thrombus (not visualized on TTE and MR 10/2023) New subscapular splenic infarct Nonadherent to Eliquis  at home. Eliquis  held due to downtrending Hgb. Will continue to hold due to anemia, fluctuating Hgb, and potential BKA this week.  Continue subcutaneous heparin  for VTE prophylaxis. Trend Hgb  Acute on chronic anemia Low sat ratios during last hospitalization. BC NGTD, little to no concern for bacteremia. IV iron  sucrose 500mg  transfused  7/8. Will continue daily CBC's. Transfuse if Hbg <7  Chronic osteomyelitis R foot PAD Pt has long history of persistent RLE osteomyelitis. This hospitalization it has continued to worsen. He was not on medications prior to admission and has not wished to treat it in the past. Palliative care saw pt and pt expressed desire to continue to be full code, full scope.  -Ortho recommended BKA which previously pt was consenting to. Pt regained capacity today. Family at bedside. Discussed the risk and benefits along with the whole scope of pts illness and they would like to proceed with the amputation.  -Dr. Harden with ortho consulted today  Multisystem organ failure Goals of care Palliative Consult Severe protein calorie malnutrition Discussed end stage HFrEF and recurrent hospitalizations.  -Palliative to see pt today and will discuss goals of care with patient afterwards.  Long conversation had yesterday and pt expressed multiple times that he would like to break this cycle and stop being in the hospital. Discussed again his poor health overall and that he will continue this cycle based on the course he is choosing with aggressive inpatient care and noncompliance outside the hospital. Also consulted chaplain to continue conversations. At this time, he wishes to continue aggressive care including possible BKA.   -Per PT eval, pt to SNF at discharge.   Best Practice: Diet: Renal diet IVF: Fluids: LR, Rate: 82mL/hr for 10hrs VTE: heparin  injection 5,000 Units Start: 11/05/23 1400Holding Eliquis  on 7/5 Code: Full  Disposition planning: Therapy Recs: Pending, DME: other pending DISPO: Anticipated discharge in 2-3 days to pending PT eval pending clinical improvement.  Signature:  Schuyler Novak, DO [facility]  11:25 AM, 11/09/2023  On Call pager [phone]

## 2023-11-10 DIAGNOSIS — R531 Weakness: Secondary | ICD-10-CM | POA: Diagnosis not present

## 2023-11-10 DIAGNOSIS — N179 Acute kidney failure, unspecified: Secondary | ICD-10-CM | POA: Diagnosis not present

## 2023-11-10 DIAGNOSIS — D649 Anemia, unspecified: Secondary | ICD-10-CM | POA: Diagnosis not present

## 2023-11-10 DIAGNOSIS — R000 Tachycardia, unspecified: Secondary | ICD-10-CM | POA: Diagnosis not present

## 2023-11-10 LAB — RENAL FUNCTION PANEL
Albumin: 1.7 g/dL — ABNORMAL LOW (ref 3.5–5.0)
Anion gap: 8 (ref 5–15)
BUN: 31 mg/dL — ABNORMAL HIGH (ref 8–23)
CO2: 18 mmol/L — ABNORMAL LOW (ref 22–32)
Calcium: 8.4 mg/dL — ABNORMAL LOW (ref 8.9–10.3)
Chloride: 104 mmol/L (ref 98–111)
Creatinine, Ser: 2.16 mg/dL — ABNORMAL HIGH (ref 0.61–1.24)
GFR, Estimated: 33 mL/min — ABNORMAL LOW (ref >60)
Glucose, Bld: 148 mg/dL — ABNORMAL HIGH (ref 70–99)
Phosphorus: 2.6 mg/dL (ref 2.5–4.6)
Potassium: 4.7 mmol/L (ref 3.5–5.1)
Sodium: 130 mmol/L — ABNORMAL LOW (ref 135–145)

## 2023-11-10 LAB — UPEP/UIFE/LIGHT CHAINS/TP, 24-HR UR
% BETA, Urine: 20.2 %
ALPHA 1 URINE: 3.7 %
Albumin, U: 17.9 %
Alpha 2, Urine: 21.2 %
Free Kappa Lt Chains,Ur: 334.99 mg/L — ABNORMAL HIGH (ref 1.17–86.46)
Free Kappa/Lambda Ratio: 0.75 — ABNORMAL LOW (ref 1.83–14.26)
Free Lambda Lt Chains,Ur: 449.37 mg/L — ABNORMAL HIGH (ref 0.27–15.21)
GAMMA GLOBULIN URINE: 36.9 %
M-SPIKE %, Urine: 18.2 % — ABNORMAL HIGH
M-Spike, Mg/24 Hr: 272 mg/(24.h) — ABNORMAL HIGH
Total Protein, Urine-Ur/day: 1492 mg/(24.h) — ABNORMAL HIGH (ref 30–150)
Total Protein, Urine: 114.8 mg/dL

## 2023-11-10 LAB — CBC
HCT: 27.7 % — ABNORMAL LOW (ref 39.0–52.0)
Hemoglobin: 8.7 g/dL — ABNORMAL LOW (ref 13.0–17.0)
MCH: 27.4 pg (ref 26.0–34.0)
MCHC: 31.4 g/dL (ref 30.0–36.0)
MCV: 87.1 fL (ref 80.0–100.0)
Platelets: 251 K/uL (ref 150–400)
RBC: 3.18 MIL/uL — ABNORMAL LOW (ref 4.22–5.81)
RDW: 16.8 % — ABNORMAL HIGH (ref 11.5–15.5)
WBC: 5.4 K/uL (ref 4.0–10.5)
nRBC: 0 % (ref 0.0–0.2)

## 2023-11-10 LAB — GLUCOSE, CAPILLARY
Glucose-Capillary: 159 mg/dL — ABNORMAL HIGH (ref 70–99)
Glucose-Capillary: 175 mg/dL — ABNORMAL HIGH (ref 70–99)
Glucose-Capillary: 289 mg/dL — ABNORMAL HIGH (ref 70–99)
Glucose-Capillary: 132 mg/dL — ABNORMAL HIGH (ref 70–99)

## 2023-11-10 MED ORDER — BOOST / RESOURCE BREEZE PO LIQD CUSTOM
1.0000 | Freq: Three times a day (TID) | ORAL | Status: DC
  Administered 2023-11-10 – 2023-12-17 (×100): 1 via ORAL

## 2023-11-10 MED ORDER — DOXYCYCLINE HYCLATE 100 MG PO TABS
100.0000 mg | ORAL_TABLET | Freq: Two times a day (BID) | ORAL | Status: AC
  Administered 2023-11-10 – 2023-11-18 (×16): 100 mg via ORAL
  Filled 2023-11-10 (×16): qty 1

## 2023-11-10 MED ORDER — ENOXAPARIN SODIUM 100 MG/ML IJ SOSY
100.0000 mg | PREFILLED_SYRINGE | INTRAMUSCULAR | Status: DC
  Administered 2023-11-10 – 2023-11-15 (×6): 100 mg via SUBCUTANEOUS
  Filled 2023-11-10 (×7): qty 1

## 2023-11-10 NOTE — Progress Notes (Addendum)
 PHARMACY - ANTICOAGULATION CONSULT NOTE  Pharmacy Consult for lovenox  Indication: LV thrombus  No Known Allergies  Patient Measurements: Height: 6' (182.9 cm) Weight: 69.2 kg (152 lb 8.9 oz) IBW/kg (Calculated) : 77.6 HEPARIN  DW (KG): 69.2  Vital Signs: Temp: 98.5 F (36.9 C) (07/11 0723) Temp Source: Oral (07/11 0723) BP: 116/76 (07/11 0723) Pulse Rate: 115 (07/11 0723)  Labs: Recent Labs    11/08/23 0438 11/09/23 0553 11/10/23 0528  HGB 8.9* 9.0* 8.7*  HCT 28.0* 28.5* 27.7*  PLT 303 286 251  CREATININE 2.21* 2.05* 2.16*    Estimated Creatinine Clearance: 33.8 mL/min (A) (by C-G formula based on SCr of 2.16 mg/dL (H)).   Medical History: Past Medical History:  Diagnosis Date   Anemia    Diabetes mellitus without complication (HCC) 1987   Family history of breast cancer    Hypertension    Myocardial infarction (HCC)    Prostate cancer (HCC)     Medications:  Medications Prior to Admission  Medication Sig Dispense Refill Last Dose/Taking   apixaban  (ELIQUIS ) 5 MG TABS tablet Take 1 tablet (5 mg total) by mouth 2 (two) times daily. (Patient taking differently: Take 5 mg by mouth daily.) 60 tablet 0 10/31/2023   aspirin  EC 81 MG tablet Take 1 tablet (81 mg total) by mouth daily. Swallow whole. 30 tablet 0 11/01/2023   atorvastatin  (LIPITOR ) 80 MG tablet Take 1 tablet (80 mg total) by mouth daily. 30 tablet 0 11/01/2023   dapagliflozin  propanediol (FARXIGA ) 10 MG TABS tablet Take 1 tablet (10 mg total) by mouth daily before breakfast. 30 tablet 0 11/01/2023   ezetimibe  (ZETIA ) 10 MG tablet Take 1 tablet (10 mg total) by mouth daily. 30 tablet 0 11/01/2023   ferrous sulfate  325 (65 FE) MG tablet Take 1 tablet (325 mg total) by mouth daily. 30 tablet 0 11/01/2023   furosemide  (LASIX ) 20 MG tablet Take 1 tablet (20 mg total) by mouth daily as needed for fluid or edema. Please take on tablet if your have shortness of breath, leg swelling, gain more than 3 pounds within one day or  gain more than 5 pounds within one week. 30 tablet 11 Unknown   metFORMIN  (GLUCOPHAGE ) 500 MG tablet Take 2 tablets (1,000 mg total) by mouth 2 (two) times daily with a meal. (Patient taking differently: Take 1,000 mg by mouth daily.) 120 tablet 11 11/01/2023   metoprolol  succinate (TOPROL -XL) 25 MG 24 hr tablet TAKE 1 TABLET BY MOUTH DAILY 30 tablet 0 11/01/2023   Multiple Vitamin (MULTIVITAMIN) tablet Take 1 tablet by mouth daily.   11/01/2023   spironolactone  (ALDACTONE ) 25 MG tablet Take 1 tablet (25 mg total) by mouth daily. 30 tablet 0 11/01/2023   tamsulosin  (FLOMAX ) 0.4 MG CAPS capsule Take 1 capsule (0.4 mg total) by mouth daily after supper. 30 capsule 0 11/01/2023   amoxicillin -clavulanate (AUGMENTIN ) 875-125 MG tablet Take 1 tablet by mouth every 12 (twelve) hours. (Patient not taking: Reported on 11/02/2023) 5 tablet 0 Not Taking   Scheduled:   amoxicillin -clavulanate  1 tablet Oral Q12H   aspirin  EC  81 mg Oral Daily   Chlorhexidine  Gluconate Cloth  6 each Topical Daily   doxycycline   100 mg Oral Q12H   enoxaparin  (LOVENOX ) injection  100 mg Subcutaneous Q24H   ezetimibe   10 mg Oral Daily   feeding supplement  1 Container Oral TID BM   guaiFENesin   1,200 mg Oral BID   insulin  aspart  0-5 Units Subcutaneous QHS   insulin   aspart  0-9 Units Subcutaneous TID WC   mupirocin  ointment   Nasal BID   tamsulosin   0.4 mg Oral QPC supper   Infusions:   Assessment: Patient nonadherent to outpatient apixaban  therapy for LV thrombus. Has been receiving subcutaneous heparin  prophylaxis dosing this admission. Initiated enoxaparin  treatment dosing for LV thrombus with anticipated BKA on 7/18.   Goal of Therapy:  Anti-Xa level 0.6-1 units/ml 4hrs after LMWH dose given Monitor platelets by anticoagulation protocol: Yes   Plan:  Lovenox  subcutaneous 100mg  every 24 hours. Plan to restart apixaban  5mg  twice daily at discharge. Follow-up perioperative anticoagulation plan for BKA on 7/18.  Mendel Barter,  PharmD PGY1 Clinical Pharmacist [facility]  11/10/2023 11:25 AM

## 2023-11-10 NOTE — Progress Notes (Signed)
 Physical Therapy Treatment Patient Details Name: Derek Thompson MRN: [id number] DOB: 04/25/1961 Today's Date: 11/10/2023   History of Present Illness Pt is a 63 y/o M presenting to ED on 11/02/23 with lethargy. Pt found to be tachycardic, hyperkalemic, and hyponatremia. Recent hospital stay in June. PMH: urinary retention, HTN, DM2, CVA in February 2025, anemia, prostate cancer with chronic incontinence following radiation therapy, recent osteomyelitis of the right foot and LV thrombus; s/p R foot 1st and 2nd ray amputation, HFrEF    PT Comments  Pt making slow progress. Continues to demonstrate slow processing and poor initiation. Amb is extremely slow with pt taking ~10 minutes to amb 50'. Reports he feels tired. Patient will benefit from continued inpatient follow up therapy, <3 hours/day.    If plan is discharge home, recommend the following: A lot of help with bathing/dressing/bathroom;Supervision due to cognitive status;Help with stairs or ramp for entrance;A little help with walking and/or transfers   Can travel by private vehicle     Yes  Equipment Recommendations  None recommended by PT    Recommendations for Other Services       Precautions / Restrictions Precautions Precautions: Fall;Other (comment) Recall of Precautions/Restrictions: Impaired Required Braces or Orthoses: Other Brace Other Brace: rt post op shoe Restrictions Weight Bearing Restrictions Per Provider Order: No Other Position/Activity Restrictions: has a post op shoe for R foot     Mobility  Bed Mobility   Bed Mobility: Rolling, Sidelying to Sit, Sit to Supine Rolling: Contact guard assist Sidelying to sit: Contact guard assist, Used rails, HOB elevated   Sit to supine: Min assist   General bed mobility comments: Incr time and effort. Assist to bring feet back up into bed.    Transfers Overall transfer level: Needs assistance Equipment used: Rolling walker (2 wheels) Transfers: Sit to/from  Stand Sit to Stand: Min assist           General transfer comment: Assist to power up    Ambulation/Gait Ambulation/Gait assistance: Min assist Gait Distance (Feet): 50 Feet Assistive device: Rolling walker (2 wheels) Gait Pattern/deviations: Decreased stride length, Decreased step length - right, Decreased stance time - right, Step-to pattern, Trunk flexed Gait velocity: decr Gait velocity interpretation: <1.31 ft/sec, indicative of household ambulator   General Gait Details: Very very slow. Took ~10 minutes to amb 50'. Slow deliberate movements   Stairs             Wheelchair Mobility     Tilt Bed    Modified Rankin (Stroke Patients Only)       Balance Overall balance assessment: Needs assistance Sitting-balance support: No upper extremity supported Sitting balance-Leahy Scale: Fair     Standing balance support: Bilateral upper extremity supported, During functional activity, Single extremity supported Standing balance-Leahy Scale: Poor Standing balance comment: walker and CGA for static standing                            Communication Communication Communication: Impaired Factors Affecting Communication: Difficulty expressing self  Cognition Arousal: Alert Behavior During Therapy: Flat affect   PT - Cognitive impairments: Initiation, Problem solving, Sequencing                       PT - Cognition Comments: Slow processing and very slow deliberate movements Following commands: Impaired Following commands impaired: Follows one step commands with increased time    Cueing Cueing Techniques: Verbal cues, Tactile cues, Gestural  cues, Visual cues  Exercises      General Comments        Pertinent Vitals/Pain Pain Assessment Pain Assessment: No/denies pain    Home Living                          Prior Function            PT Goals (current goals can now be found in the care plan section) Acute Rehab PT  Goals Patient Stated Goal: Did not state Progress towards PT goals: Progressing toward goals (slowly)    Frequency    Min 2X/week      PT Plan      Co-evaluation              AM-PAC PT 6 Clicks Mobility   Outcome Measure  Help needed turning from your back to your side while in a flat bed without using bedrails?: A Little Help needed moving from lying on your back to sitting on the side of a flat bed without using bedrails?: A Little Help needed moving to and from a bed to a chair (including a wheelchair)?: A Little Help needed standing up from a chair using your arms (e.g., wheelchair or bedside chair)?: A Little Help needed to walk in hospital room?: A Little Help needed climbing 3-5 steps with a railing? : Total 6 Click Score: 16    End of Session Equipment Utilized During Treatment: Gait belt Activity Tolerance: Patient tolerated treatment well Patient left: in bed;with call bell/phone within reach;with bed alarm set   PT Visit Diagnosis: Unsteadiness on feet (R26.81);Difficulty in walking, not elsewhere classified (R26.2)     Time: 8671-8651 PT Time Calculation (min) (ACUTE ONLY): 20 min  Charges:    $Gait Training: 8-22 mins PT General Charges $$ ACUTE PT VISIT: 1 Visit                     [facility] PT Acute Rehabilitation Services Office [phone]    [facility] ORN [facility] 11/10/2023, 2:49 PM

## 2023-11-10 NOTE — Progress Notes (Addendum)
 HD#7 SUBJECTIVE:  Patient Summary: Derek Thompson is a 63 y.o. with a pertinent PMH of End-stage HFrEF, CKD, CVA, bladder outlet obstruction s/p coude foley catheter during last admission 10/2023, and chronic osteomyelitis , admitted hypovolemic hyponatremia and AKI with coude foley and on CTX, improving  Overnight Events: No acute events overnight.  Interim History:  Pt seen and examined at the bedside this morning. He did engage with the team. He states that he is feeling good and has no complaints. He remains having a flat affect with minimal eye contact.   OBJECTIVE:  Vital Signs: Vitals:   11/09/23 0715 11/09/23 1642 11/10/23 0430 11/10/23 0723  BP: 110/77 97/68 117/72 116/76  Pulse: (!) 110 100 (!) 110 (!) 115  Resp: 18 18 16 18   Temp: (!) 97.5 F (36.4 C) 98.3 F (36.8 C) 98.3 F (36.8 C) 98.5 F (36.9 C)  TempSrc: Oral Oral Oral Oral  SpO2:  100% 99% 98%  Weight:      Height:       Supplemental O2: Room Air SpO2: 98 %  Filed Weights   11/02/23 1234 11/02/23 1545  Weight: 70.3 kg 69.2 kg     Intake/Output Summary (Last 24 hours) at 11/10/2023 1346 Last data filed at 11/10/2023 1100 Gross per 24 hour  Intake 100 ml  Output 1650 ml  Net -1550 ml   Net IO Since Admission: -5,525.13 mL [11/10/23 1346]  Physical Exam: Const: Awake, alert in NAD HENT: Normocephalic, atraumatic, Card: RRR, No MRG, No pitting edema on LE's bilaterally  Resp: Decreased at the bases. Abd: Soft, NTND, Extremities: Warm. RLE bandage in place over foot with discoloration of the toes.   Patient Lines/Drains/Airways Status     Active Line/Drains/Airways     Name Placement date Placement time Site Days   Peripheral IV 11/03/23 22 G 1.75 Left;Proximal Forearm 11/03/23  0945  Forearm  1   Peripheral IV 11/04/23 20 G 1 Posterior;Right Forearm 11/04/23  0845  Forearm  less than 1   Urethral Catheter Mekides Nida, RN Coude 16 Fr. 11/03/23  1034  Coude  1   Wound / Incision (Open  or Dehisced) 09/01/23 Non-pressure wound;Amputation Foot Anterior;Right amputation of right big toe in february, reddened foot area with purulent drainage present 09/01/23  1721  Foot  64   Wound 11/02/23 1655 Vascular Ulcer Foot Anterior;Right 11/02/23  1655  Foot  2   Wound 11/02/23 1658 Vascular Ulcer Foot Right;Lateral 11/02/23  1658  Foot  2            Pertinent labs and imaging:      Latest Ref Rng & Units 11/10/2023    5:28 AM 11/09/2023    5:53 AM 11/08/2023    4:38 AM  CBC  WBC 4.0 - 10.5 K/uL 5.4  6.1  5.8   Hemoglobin 13.0 - 17.0 g/dL 8.7  9.0  8.9   Hematocrit 39.0 - 52.0 % 27.7  28.5  28.0   Platelets 150 - 400 K/uL 251  286  303        Latest Ref Rng & Units 11/10/2023    5:28 AM 11/09/2023    5:53 AM 11/08/2023    4:38 AM  CMP  Glucose 70 - 99 mg/dL 851  881  824   BUN 8 - 23 mg/dL 31  30  33   Creatinine 0.61 - 1.24 mg/dL 7.83  7.94  7.78   Sodium 135 - 145 mmol/L 130  132  128   Potassium 3.5 - 5.1 mmol/L 4.7  4.6  4.7   Chloride 98 - 111 mmol/L 104  107  105   CO2 22 - 32 mmol/L 18  18  18    Calcium  8.9 - 10.3 mg/dL 8.4  8.6  7.9    No results found.    ASSESSMENT/PLAN:  Assessment: Principal Problem:   Hyponatremia Active Problems:   CAD (coronary artery disease)   Hyperkalemia   AKI (acute kidney injury) (HCC)   Anemia   LV (left ventricular) mural thrombus without MI (HCC)   HFrEF (heart failure with reduced ejection fraction) (HCC)   Chronic osteomyelitis involving lower leg, right (HCC)   Frailty   Urinary retention   Generalized weakness   Sinus tachycardia   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 (HCC)   Chronic anemia   Hypoalbuminemia   Plan:  Sepsis Possible PNA -Pt does no longer meets SIRS criteria Chronic osteomyelitis of the R foot vs PNA. Urine culture negative, so do not suspect urinary source at this time.  -Low suspicion of PNA. BC NGTD, Urine Culture negative. Resp viral panel neg. MRSA PCR pos--low  suspicion for MRSA PNA given no fever and normal WBC. Suspect tachypnea and respiratory distress this admission is of cardiac origin and pulmonary edema. -Respiratory status continuing to improve.   AKI Bladder outlet obstruction with BL hydroureter UTI NAGMA Since admission, AKI has significantly improved with BUN 31 and CR 2.16 today. Continued increased urine output today again. Possible post ATN diuresis. Will keep coude catheter in place. Urine culture negative. Will hold fluids today as pts oral intake has been adequate.   Hyponatremia Na 130. Likely multifactorial, including cardiorenal syndrome, AKI, bladder outlet obstruction, and hypovolemia. Could potentially be due to pseudohyponatremia due to protein gap and elevated M-Spike. Pt continues to mildly improve with fluids, but this is not sustained.   Elevated Protein gap  SPEP with Positive M-Spike SPEP with gamma globulin 2.1 and Mspike 1.1. Kappa/Lambda light chain ratio 0.58, decreasing concern for multiple myeloma. IgG elevated at 2,664 with lamba specificity, possibly due to chronic illness and recurrent infections. Urine proteins pending. This could also be contributing to hyponatremia in form of a pseudohyponatremia.  -Will consult hematology at discharge  Chronic osteomyelitis R foot PAD Pt has long history of persistent RLE osteomyelitis. This hospitalization it has continued to worsen. He was not on medications prior to admission and has not wished to treat it in the past. Palliative care saw pt and pt expressed desire to continue to be full code, full scope.  -Ortho recommended BKA which previously pt was consenting to. Pt regained capacity today. Family at bedside. Discussed the risk and benefits along with the whole scope of pts illness and they would like to proceed with the amputation.  -Abx adjusted today to doxycycline  and Augmentin  for suppression due to Step anginosis culture and sensitivities last wound  culture. -BKA scheduled for 7/18 with Dr. Harden  Biventricular HFrEF EF 20-25%.  Hx of nonobstructive CAD. Nonadherent to medications. Not a candidate for advanced therapies. Suspicion of prior MI but none documented in the chart. Remains peripherally euvolemic. Pt has not had anymore episodes of tachypnea or orthopnea - Monitor electrolytes; goal Mg 2, K ~4. - Monitor urine output.  Hx of LV thrombus (not visualized on TTE and MR 10/2023) New subscapular splenic infarct Nonadherent to Eliquis  at home. Eliquis  held due to downtrending Hgb. Will continue to hold due to  anemia, fluctuating Hgb, and potential BKA this week. -Lovenox  initiated for VTE prophylaxis  Acute on chronic anemia -Low sat ratios during last hospitalization.  -S/p IV Iron  7/8. Will continue daily CBC's. Transfuse if Hbg <7  Encephalopathy Delirium Cefepime  neurotoxicity Resolved.   Multisystem organ failure Goals of care Palliative Consult Severe protein calorie malnutrition -Discussed multisystem organ failure and recurrent hospitalizations -At this time, he and his family wish to continue aggressive care including BKA.   -Per PT eval, pt to SNF at discharge.   Best Practice: Diet: Renal diet IVF: Fluids: LR, Rate: 53mL/hr for 10hrs VTE: Lovenox  Code: Full  Disposition planning: Therapy Recs: Pending, DME: other pending DISPO: Anticipated discharge in 8 days to pending PT eval pending clinical improvement and R BKA.  Signature:  Schuyler Novak, DO [facility]  1:46 PM, 11/10/2023  On Call pager [phone]

## 2023-11-11 DIAGNOSIS — I2510 Atherosclerotic heart disease of native coronary artery without angina pectoris: Secondary | ICD-10-CM

## 2023-11-11 DIAGNOSIS — I5082 Biventricular heart failure: Secondary | ICD-10-CM

## 2023-11-11 DIAGNOSIS — A491 Streptococcal infection, unspecified site: Secondary | ICD-10-CM | POA: Diagnosis not present

## 2023-11-11 DIAGNOSIS — M86671 Other chronic osteomyelitis, right ankle and foot: Secondary | ICD-10-CM | POA: Diagnosis not present

## 2023-11-11 DIAGNOSIS — E871 Hypo-osmolality and hyponatremia: Secondary | ICD-10-CM | POA: Diagnosis not present

## 2023-11-11 DIAGNOSIS — N179 Acute kidney failure, unspecified: Secondary | ICD-10-CM | POA: Diagnosis not present

## 2023-11-11 LAB — RENAL FUNCTION PANEL
Albumin: 1.8 g/dL — ABNORMAL LOW (ref 3.5–5.0)
Anion gap: 3 — ABNORMAL LOW (ref 5–15)
BUN: 32 mg/dL — ABNORMAL HIGH (ref 8–23)
CO2: 16 mmol/L — ABNORMAL LOW (ref 22–32)
Calcium: 8.1 mg/dL — ABNORMAL LOW (ref 8.9–10.3)
Chloride: 107 mmol/L (ref 98–111)
Creatinine, Ser: 2.07 mg/dL — ABNORMAL HIGH (ref 0.61–1.24)
GFR, Estimated: 35 mL/min — ABNORMAL LOW (ref >60)
Glucose, Bld: 174 mg/dL — ABNORMAL HIGH (ref 70–99)
Phosphorus: 2.4 mg/dL — ABNORMAL LOW (ref 2.5–4.6)
Potassium: 4.9 mmol/L (ref 3.5–5.1)
Sodium: 126 mmol/L — ABNORMAL LOW (ref 135–145)

## 2023-11-11 LAB — CBC
HCT: 27.6 % — ABNORMAL LOW (ref 39.0–52.0)
Hemoglobin: 8.6 g/dL — ABNORMAL LOW (ref 13.0–17.0)
MCH: 27.6 pg (ref 26.0–34.0)
MCHC: 31.2 g/dL (ref 30.0–36.0)
MCV: 88.5 fL (ref 80.0–100.0)
Platelets: 211 K/uL (ref 150–400)
RBC: 3.12 MIL/uL — ABNORMAL LOW (ref 4.22–5.81)
RDW: 16.7 % — ABNORMAL HIGH (ref 11.5–15.5)
WBC: 5.3 K/uL (ref 4.0–10.5)
nRBC: 0 % (ref 0.0–0.2)

## 2023-11-11 LAB — GLUCOSE, CAPILLARY
Glucose-Capillary: 171 mg/dL — ABNORMAL HIGH (ref 70–99)
Glucose-Capillary: 193 mg/dL — ABNORMAL HIGH (ref 70–99)
Glucose-Capillary: 186 mg/dL — ABNORMAL HIGH (ref 70–99)
Glucose-Capillary: 172 mg/dL — ABNORMAL HIGH (ref 70–99)

## 2023-11-11 MED ORDER — METOPROLOL TARTRATE 12.5 MG HALF TABLET
12.5000 mg | ORAL_TABLET | Freq: Two times a day (BID) | ORAL | Status: DC
  Administered 2023-11-11 – 2023-11-14 (×7): 12.5 mg via ORAL
  Filled 2023-11-11 (×7): qty 1

## 2023-11-11 NOTE — Plan of Care (Signed)

## 2023-11-11 NOTE — Progress Notes (Signed)
 HD#8 SUBJECTIVE:  Patient Summary: Derek Thompson is a 63 y.o. with a pertinent PMH of End-stage HFrEF, CKD, CVA, bladder outlet obstruction s/p coude foley catheter during last admission 10/2023, and chronic osteomyelitis , admitted hypovolemic hyponatremia and AKI with coude foley and on CTX, improving  Overnight Events: No acute events overnight.  Interim History:  Pt seen and examined at the bedside this morning. He states that he is very sleepy and went to bed last night. He has no complaints and is feeling okay overall.   OBJECTIVE:  Vital Signs: Vitals:   11/10/23 0723 11/10/23 1623 11/10/23 2029 11/11/23 0444  BP: 116/76 105/73 100/68 119/84  Pulse: (!) 115 (!) 104 (!) 104 100  Resp: 18 16 16 20   Temp: 98.5 F (36.9 C) 98.4 F (36.9 C) 98.4 F (36.9 C) 98.3 F (36.8 C)  TempSrc: Oral Oral Oral Oral  SpO2: 98% 100% 100% 99%  Weight:      Height:       Supplemental O2: Room Air SpO2: 99 %  Filed Weights   11/02/23 1234 11/02/23 1545  Weight: 70.3 kg 69.2 kg     Intake/Output Summary (Last 24 hours) at 11/11/2023 0640 Last data filed at 11/11/2023 0400 Gross per 24 hour  Intake --  Output 1800 ml  Net -1800 ml   Net IO Since Admission: -6,525.13 mL [11/11/23 0640]  Physical Exam: Const: Awake, alert in NAD HENT: Normocephalic, atraumatic, Card: RRR, No MRG, No pitting edema on LE's bilaterally  Resp: Decreased at the bases. Extremities: Warm. RLE bandage in place over foot with discoloration of the toes. Decreased skin turgor in bilateral lower extremities  Patient Lines/Drains/Airways Status     Active Line/Drains/Airways     Name Placement date Placement time Site Days   Peripheral IV 11/03/23 22 G 1.75 Left;Proximal Forearm 11/03/23  0945  Forearm  1   Peripheral IV 11/04/23 20 G 1 Posterior;Right Forearm 11/04/23  0845  Forearm  less than 1   Urethral Catheter Mekides Nida, RN Coude 16 Fr. 11/03/23  1034  Coude  1   Wound / Incision (Open or  Dehisced) 09/01/23 Non-pressure wound;Amputation Foot Anterior;Right amputation of right big toe in february, reddened foot area with purulent drainage present 09/01/23  1721  Foot  64   Wound 11/02/23 1655 Vascular Ulcer Foot Anterior;Right 11/02/23  1655  Foot  2   Wound 11/02/23 1658 Vascular Ulcer Foot Right;Lateral 11/02/23  1658  Foot  2            Pertinent labs and imaging:      Latest Ref Rng & Units 11/10/2023    5:28 AM 11/09/2023    5:53 AM 11/08/2023    4:38 AM  CBC  WBC 4.0 - 10.5 K/uL 5.4  6.1  5.8   Hemoglobin 13.0 - 17.0 g/dL 8.7  9.0  8.9   Hematocrit 39.0 - 52.0 % 27.7  28.5  28.0   Platelets 150 - 400 K/uL 251  286  303        Latest Ref Rng & Units 11/10/2023    5:28 AM 11/09/2023    5:53 AM 11/08/2023    4:38 AM  CMP  Glucose 70 - 99 mg/dL 851  881  824   BUN 8 - 23 mg/dL 31  30  33   Creatinine 0.61 - 1.24 mg/dL 7.83  7.94  7.78   Sodium 135 - 145 mmol/L 130  132  128   Potassium  3.5 - 5.1 mmol/L 4.7  4.6  4.7   Chloride 98 - 111 mmol/L 104  107  105   CO2 22 - 32 mmol/L 18  18  18    Calcium  8.9 - 10.3 mg/dL 8.4  8.6  7.9    No results found.    ASSESSMENT/PLAN:  Assessment: Principal Problem:   Osteomyelitis of right foot (HCC) Active Problems:   CAD (coronary artery disease)   Hyperkalemia   AKI (acute kidney injury) (HCC)   Anemia   LV (left ventricular) mural thrombus without MI (HCC)   Hyponatremia   HFrEF (heart failure with reduced ejection fraction) (HCC)   Chronic osteomyelitis involving lower leg, right (HCC)   Frailty   Urinary retention   Generalized weakness   Sinus tachycardia   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 (HCC)   Chronic anemia   Hypoalbuminemia   Plan:  Sepsis Possible PNA -Pt no longer meets SIRS criteria Chronic osteomyelitis of the R foot vs PNA. Urine culture negative, so do not suspect urinary source at this time.  -Low suspicion of PNA. BC NGTD, Urine Culture negative. Resp viral  panel neg. MRSA PCR pos--low suspicion for MRSA PNA given no fever and normal WBC. Suspect tachypnea and respiratory distress this admission is of cardiac origin and pulmonary edema. -Abx course complete -Respiratory status continuing to improve.   AKI Bladder outlet obstruction with BL hydroureter UTI NAGMA Since admission, AKI has significantly improved. Continued increased urine output. Possible post ATN diuresis. Will keep coude catheter in place. Urine culture negative. Will continue to hold fluids today as pts oral intake has been adequate.   Hyponatremia Likely multifactorial, including cardiorenal syndrome, AKI, bladder outlet obstruction, and hypovolemia. Pt continues to mildly improve with fluids, but this is not sustained. Could potentially be due to pseudohyponatremia due to presence of monoclonal gammopathy.   Elevated Protein gap  SPEP with Positive M-Spike SPEP with gamma globulin 2.1 and Mspike 1.1. Kappa/Lambda light chain ratio 0.58. IgG elevated at 2,664 with lamba specificity, UPEP bence jones protein positive.  -This could also be contributing to hyponatremia in form of a pseudohyponatremia.  -Will consult hematology on monday  Chronic osteomyelitis R foot PAD Pt has long history of persistent RLE osteomyelitis. This hospitalization it has continued to worsen. He was not on medications prior to admission and has not wished to treat it in the past.  -Ortho recommended BKA which previously pt was consenting to. Pt regained capacity today. Family at bedside. Discussed the risk and benefits along with the whole scope of pts illness and they would like to proceed with the amputation.  -Continue Doxycycline  and Augmentin  for suppression due to Step anginosis culture and sensitivities last wound culture. -BKA scheduled for 7/18 with Dr. Harden  Biventricular HFrEF EF 20-25%.  Hx of nonobstructive CAD. Nonadherent to medications. Not a candidate for advanced therapies.  Suspicion of prior MI but none documented in the chart. Remains peripherally euvolemic. Pt has not had anymore episodes of tachypnea or orthopnea - Monitor electrolytes; goal Mg 2, K ~4. - Monitor urine output. Will initiate Metoprolol  today for persistent tacchycardia  Hx of LV thrombus (not visualized on TTE and MR 10/2023) New subscapular splenic infarct Nonadherent to Eliquis  at home. Eliquis  held due to downtrending Hgb. Will continue to hold due to anemia, fluctuating Hgb, and potential BKA next week. -Lovenox  initiated for VTE prophylaxis  Acute on chronic anemia -Low sat ratios during last hospitalization.  -S/p IV  Iron  7/8. Will continue daily CBC's. Transfuse if Hbg <7  Encephalopathy Delirium Cefepime  neurotoxicity Resolved.   Multisystem organ failure Goals of care Palliative Consult Severe protein calorie malnutrition -Discussed multisystem organ failure and recurrent hospitalizations -Palliative care saw pt and pt expressed desire to continue to be full code, full scope.  -At this time, he and his family wish to continue aggressive care including BKA.   -Per PT eval, pt to SNF at discharge.   Best Practice: Diet: Renal diet IVF: Fluids: none VTE: Lovenox  Code: Full  Disposition planning: Therapy Recs: Pending, DME: other pending DISPO: Anticipated discharge pending PT eval pending clinical improvement and R BKA.  Signature:  Schuyler Novak, DO [facility]  6:40 AM, 11/11/2023  On Call pager [phone]

## 2023-11-12 DIAGNOSIS — D472 Monoclonal gammopathy: Secondary | ICD-10-CM | POA: Insufficient documentation

## 2023-11-12 LAB — BASIC METABOLIC PANEL WITH GFR
Anion gap: 7 (ref 5–15)
BUN: 36 mg/dL — ABNORMAL HIGH (ref 8–23)
CO2: 17 mmol/L — ABNORMAL LOW (ref 22–32)
Calcium: 8.8 mg/dL — ABNORMAL LOW (ref 8.9–10.3)
Chloride: 104 mmol/L (ref 98–111)
Creatinine, Ser: 2.24 mg/dL — ABNORMAL HIGH (ref 0.61–1.24)
GFR, Estimated: 32 mL/min — ABNORMAL LOW (ref >60)
Glucose, Bld: 145 mg/dL — ABNORMAL HIGH (ref 70–99)
Potassium: 4.9 mmol/L (ref 3.5–5.1)
Sodium: 128 mmol/L — ABNORMAL LOW (ref 135–145)

## 2023-11-12 LAB — CBC
HCT: 28.9 % — ABNORMAL LOW (ref 39.0–52.0)
Hemoglobin: 8.8 g/dL — ABNORMAL LOW (ref 13.0–17.0)
MCH: 26.8 pg (ref 26.0–34.0)
MCHC: 30.4 g/dL (ref 30.0–36.0)
MCV: 88.1 fL (ref 80.0–100.0)
Platelets: 189 K/uL (ref 150–400)
RBC: 3.28 MIL/uL — ABNORMAL LOW (ref 4.22–5.81)
RDW: 16.8 % — ABNORMAL HIGH (ref 11.5–15.5)
WBC: 5.4 K/uL (ref 4.0–10.5)
nRBC: 0 % (ref 0.0–0.2)

## 2023-11-12 LAB — GLUCOSE, CAPILLARY
Glucose-Capillary: 141 mg/dL — ABNORMAL HIGH (ref 70–99)
Glucose-Capillary: 220 mg/dL — ABNORMAL HIGH (ref 70–99)
Glucose-Capillary: 157 mg/dL — ABNORMAL HIGH (ref 70–99)
Glucose-Capillary: 155 mg/dL — ABNORMAL HIGH (ref 70–99)

## 2023-11-12 NOTE — Plan of Care (Signed)
  Problem: Clinical Measurements: Goal: Ability to maintain clinical measurements within normal limits will improve Outcome: Progressing Goal: Will remain free from infection Outcome: Progressing Goal: Diagnostic test results will improve Outcome: Progressing Goal: Respiratory complications will improve Outcome: Progressing Goal: Cardiovascular complication will be avoided Outcome: Progressing   Problem: Activity: Goal: Risk for activity intolerance will decrease Outcome: Progressing   Problem: Nutrition: Goal: Adequate nutrition will be maintained Outcome: Progressing   Problem: Coping: Goal: Level of anxiety will decrease Outcome: Progressing   Problem: Elimination: Goal: Will not experience complications related to bowel motility Outcome: Progressing Goal: Will not experience complications related to urinary retention Outcome: Progressing   Problem: Pain Managment: Goal: General experience of comfort will improve and/or be controlled Outcome: Progressing   Problem: Safety: Goal: Ability to remain free from injury will improve Outcome: Progressing   Problem: Skin Integrity: Goal: Risk for impaired skin integrity will decrease Outcome: Progressing   Problem: Coping: Goal: Ability to adjust to condition or change in health will improve Outcome: Progressing   Problem: Fluid Volume: Goal: Ability to maintain a balanced intake and output will improve Outcome: Progressing   Problem: Health Behavior/Discharge Planning: Goal: Ability to manage health-related needs will improve Outcome: Progressing   Problem: Metabolic: Goal: Ability to maintain appropriate glucose levels will improve Outcome: Progressing   Problem: Nutritional: Goal: Maintenance of adequate nutrition will improve Outcome: Progressing Goal: Progress toward achieving an optimal weight will improve Outcome: Progressing   Problem: Skin Integrity: Goal: Risk for impaired skin integrity will  decrease Outcome: Progressing

## 2023-11-12 NOTE — Progress Notes (Signed)
 HD#9 SUBJECTIVE:  Patient Summary: Jaloni Yannis Broce is a 63 y.o. 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 He is currently awaiting BKA with Dr. Harden on 7/18 for chronic osteomyelitis and consultation with heme-onc for MCUS vs MM given abnormal SPEP, IF, and presence of Bence Jones proteins.  Overnight Events: No acute events overnight.  Interim History:  Conversant this AM. Shares that he has been in [place] since childhood. His mother recently returned to [place]. He chats with his brother and mother at least once per week. His son is an avid Occupational psychologist; chats with him infrequently. Son, Hulda called him as I was exiting the room.  No chest pain, dyspnea, or new symptoms today.  OBJECTIVE:  Vital Signs: Vitals:   11/11/23 1623 11/11/23 2103 11/12/23 0430 11/12/23 0711  BP: 104/74 94/62 105/70 94/63  Pulse: (!) 103 99 100 (!) 108  Resp:  20 19 18   Temp: 97.9 F (36.6 C) 98 F (36.7 C) 98.2 F (36.8 C) 97.6 F (36.4 C)  TempSrc: Oral Oral Oral Oral  SpO2: 100% 99% 99% 100%  Weight:      Height:       Supplemental O2: Room Air SpO2: 100 %  Filed Weights   11/02/23 1234 11/02/23 1545  Weight: 70.3 kg 69.2 kg     Intake/Output Summary (Last 24 hours) at 11/12/2023 1120 Last data filed at 11/12/2023 0800 Gross per 24 hour  Intake --  Output 1500 ml  Net -1500 ml   Net IO Since Admission: -8,185.13 mL [11/12/23 1120]  Physical Exam: Const: Awake and interactive. NAD HENT: Dry mucus membranes Card: RRR, no murmurs noted today.  Resp:CTAB in the apices. Decreased air movement at the bases. No crackles today. Extremities:Warm and without edema. Decreased skin turgor throughout. R foot covered with nonstick bandage and gauge, dry and intact.  Patient Lines/Drains/Airways Status     Active Line/Drains/Airways     Name Placement date Placement time  Site Days   Peripheral IV 11/03/23 22 G 1.75 Left;Proximal Forearm 11/03/23  0945  Forearm  1   Peripheral IV 11/04/23 20 G 1 Posterior;Right Forearm 11/04/23  0845  Forearm  less than 1   Urethral Catheter Mekides Nida, RN Coude 16 Fr. 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, reddened foot area with purulent drainage present 09/01/23  1721  Foot  64   Wound 11/02/23 1655 Vascular Ulcer Foot Anterior;Right 11/02/23  1655  Foot  2   Wound 11/02/23 1658 Vascular Ulcer Foot Right;Lateral 11/02/23  1658  Foot  2            Pertinent labs and imaging:      Latest Ref Rng & Units 11/12/2023    5:00 AM 11/11/2023    5:30 AM 11/10/2023    5:28 AM  CBC  WBC 4.0 - 10.5 K/uL 5.4  5.3  5.4   Hemoglobin 13.0 - 17.0 g/dL 8.8  8.6  8.7   Hematocrit 39.0 - 52.0 % 28.9  27.6  27.7   Platelets 150 - 400 K/uL 189  211  251        Latest Ref Rng & Units 11/12/2023    5:00 AM 11/11/2023    5:30 AM 11/10/2023    5:28 AM  CMP  Glucose 70 - 99 mg/dL 854  825  851   BUN 8 - 23 mg/dL 36  32  31   Creatinine 0.61 - 1.24 mg/dL 7.75  7.92  7.83   Sodium 135 - 145 mmol/L 128  126  130   Potassium 3.5 - 5.1 mmol/L 4.9  4.9  4.7   Chloride 98 - 111 mmol/L 104  107  104   CO2 22 - 32 mmol/L 17  16  18    Calcium  8.9 - 10.3 mg/dL 8.8  8.1  8.4    No results found.    ASSESSMENT/PLAN:  Assessment: Principal Problem:   Osteomyelitis of right foot (HCC) Active Problems:   CAD (coronary artery disease)   Hyperkalemia   AKI (acute kidney injury) (HCC)   Anemia   LV (left ventricular) mural thrombus without MI (HCC)   Hyponatremia   HFrEF (heart failure with reduced ejection fraction) (HCC)   Chronic osteomyelitis involving lower leg, right (HCC)   Frailty   Urinary retention   Generalized weakness   Sinus tachycardia   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  (HCC)   Chronic anemia   Hypoalbuminemia   Plan:  Chronic osteomyelitis R foot PAD Currently awaiting BKA during this hospitalization prior to discharging to SNF. Patient has minimal social support in  and won't be able to follow outpatient.  - BKA with Dr. Harden on 7/18 - Continue with doxycycline  and Augmentin  for suppression; prior wound culture sensitivities (06/2023) with Strep Anginosis and strep aureus  AKI Post ATN diuresis Bladder outlet obstruction with BL hydroureter UTI, s/p treatment Since admission, AKI has significantly improved, though it fluctuates based on oral intake. Net negative -1.5L without oral diuresis, improving - Encourage PO intake - Monitor In/outs - will need urology follow up as outpatient for coude management  Hyponatremia Multifactorial. Though pseudohyponatremia also a possibility now with monoclonal gammopathy. Has been 128-132 in the past few days. No neuro changes - Continue monitoring  Monoclonal gammopathy SPEP with Positive M-Spike SPEP with gamma globulin 2.1 and Mspike 1.1. Kappa/Lambda light chain ratio 0.58. IgG elevated at 2,664 with lamba specificity, UPEP bence jones protein positive.  -Will consult hematology during this admission  Biventricular HFrEF EF 20-25%.  Hx of nonobstructive CAD. Nonadherent to medications. Not a candidate for advanced therapies. Euvolemic and on RA - Monitor electrolytes; goal Mg 2, K ~4. - Monitor urine output. - On Lopressor  12.5 mg BID  Hx of LV thrombus (not visualized on TTE and MR 10/2023) New subscapular splenic infarct - Therapeutic lovenox   - Monitor Hgb   Acute on chronic anemia -Low sat ratios during last hospitalization.  -S/p IV Iron  7/8. - Will continue daily CBC's. Transfuse if Hbg <7  Goals of care Palliative Consult Severe protein calorie malnutrition - Discussed recurrent hospitalizations, non-adherence to medications, and severity of medical condition with patient, patient's  mother, and brother.  - Palliative care saw pt and pt expressed desire to continue to be full code, full scope.    Best Practice: Diet: Renal diet IVF: Fluids: none VTE: Lovenox  Code: Full  Disposition planning: Therapy Recs: Pending, DME: other pending DISPO: Anticipated discharge pending PT eval pending clinical improvement and R BKA.  Signature:  Hadassah Ala, MD [facility]  11:20 AM, 11/12/2023  On Call pager [phone]

## 2023-11-13 DIAGNOSIS — M86671 Other chronic osteomyelitis, right ankle and foot: Secondary | ICD-10-CM

## 2023-11-13 LAB — RENAL FUNCTION PANEL
Albumin: 1.9 g/dL — ABNORMAL LOW (ref 3.5–5.0)
Anion gap: 6 (ref 5–15)
BUN: 35 mg/dL — ABNORMAL HIGH (ref 8–23)
CO2: 17 mmol/L — ABNORMAL LOW (ref 22–32)
Calcium: 8.5 mg/dL — ABNORMAL LOW (ref 8.9–10.3)
Chloride: 106 mmol/L (ref 98–111)
Creatinine, Ser: 2.05 mg/dL — ABNORMAL HIGH (ref 0.61–1.24)
GFR, Estimated: 36 mL/min — ABNORMAL LOW (ref >60)
Glucose, Bld: 134 mg/dL — ABNORMAL HIGH (ref 70–99)
Phosphorus: 2.8 mg/dL (ref 2.5–4.6)
Potassium: 5 mmol/L (ref 3.5–5.1)
Sodium: 129 mmol/L — ABNORMAL LOW (ref 135–145)

## 2023-11-13 LAB — GLUCOSE, CAPILLARY
Glucose-Capillary: 128 mg/dL — ABNORMAL HIGH (ref 70–99)
Glucose-Capillary: 176 mg/dL — ABNORMAL HIGH (ref 70–99)
Glucose-Capillary: 225 mg/dL — ABNORMAL HIGH (ref 70–99)
Glucose-Capillary: 242 mg/dL — ABNORMAL HIGH (ref 70–99)

## 2023-11-13 MED ORDER — LACTATED RINGERS IV BOLUS
500.0000 mL | Freq: Once | INTRAVENOUS | Status: AC
  Administered 2023-11-13: 500 mL via INTRAVENOUS

## 2023-11-13 MED ORDER — METOPROLOL TARTRATE 5 MG/5ML IV SOLN
10.0000 mg | Freq: Once | INTRAVENOUS | Status: AC
  Administered 2023-11-14: 10 mg via INTRAVENOUS
  Filled 2023-11-13: qty 10

## 2023-11-13 NOTE — Plan of Care (Signed)
  Problem: Health Behavior/Discharge Planning: Goal: Ability to manage health-related needs will improve Outcome: Progressing   Problem: Activity: Goal: Risk for activity intolerance will decrease Outcome: Progressing   Problem: Nutrition: Goal: Adequate nutrition will be maintained Outcome: Progressing   Problem: Pain Managment: Goal: General experience of comfort will improve and/or be controlled Outcome: Progressing   Problem: Safety: Goal: Ability to remain free from injury will improve Outcome: Progressing

## 2023-11-13 NOTE — Progress Notes (Signed)
 HD#10 SUBJECTIVE:  Patient Summary: Derek Thompson is a 64 y.o. with a pertinent PMH of End-stage HFrEF, CKD, CVA, bladder outlet obstruction s/p coude foley catheter during last admission 10/2023, and chronic osteomyelitis , admitted hypovolemic hyponatremia and AKI with coude foley and on CTX, improving  Overnight Events: No acute events overnight.  Interim History:  Pt seen and examined at the bedside this morning. He was in a pleasant mood and very interactive with the team. He reports that overall he is feeling pretty good and not in any pain.   OBJECTIVE:  Vital Signs: Vitals:   11/12/23 2105 11/13/23 0426 11/13/23 0535 11/13/23 0802  BP:   112/73 111/73  Pulse: 100  97 95  Resp:   18 18  Temp:   98.2 F (36.8 C) (!) 97.5 F (36.4 C)  TempSrc:   Oral Oral  SpO2:   99% 100%  Weight:  64.4 kg    Height:       Supplemental O2: Room Air SpO2: 100 %  Filed Weights   11/02/23 1234 11/02/23 1545 11/13/23 0426  Weight: 70.3 kg 69.2 kg 64.4 kg     Intake/Output Summary (Last 24 hours) at 11/13/2023 1317 Last data filed at 11/13/2023 1100 Gross per 24 hour  Intake 237 ml  Output 1050 ml  Net -813 ml   Net IO Since Admission: -8,758.13 mL [11/13/23 1317]  Physical Exam: Const: Awake, alert in NAD HENT: Normocephalic, atraumatic, Card: RRR, No MRG, No pitting edema on LE's bilaterally  Resp: Decreased at the bases. Extremities: Warm. RLE bandage in place over foot with discoloration of the toes. Decreased skin turgor in bilateral lower extremities  Patient Lines/Drains/Airways Status     Active Line/Drains/Airways     Name Placement date Placement time Site Days   Peripheral IV 11/03/23 22 G 1.75 Left;Proximal Forearm 11/03/23  0945  Forearm  1   Peripheral IV 11/04/23 20 G 1 Posterior;Right Forearm 11/04/23  0845  Forearm  less than 1   Urethral Catheter Mekides Nida, RN Coude 16 Fr. 11/03/23  1034  Coude  1   Wound / Incision (Open or Dehisced) 09/01/23  Non-pressure wound;Amputation Foot Anterior;Right amputation of right big toe in february, reddened foot area with purulent drainage present 09/01/23  1721  Foot  64   Wound 11/02/23 1655 Vascular Ulcer Foot Anterior;Right 11/02/23  1655  Foot  2   Wound 11/02/23 1658 Vascular Ulcer Foot Right;Lateral 11/02/23  1658  Foot  2            Pertinent labs and imaging:      Latest Ref Rng & Units 11/12/2023    5:00 AM 11/11/2023    5:30 AM 11/10/2023    5:28 AM  CBC  WBC 4.0 - 10.5 K/uL 5.4  5.3  5.4   Hemoglobin 13.0 - 17.0 g/dL 8.8  8.6  8.7   Hematocrit 39.0 - 52.0 % 28.9  27.6  27.7   Platelets 150 - 400 K/uL 189  211  251        Latest Ref Rng & Units 11/13/2023    5:33 AM 11/12/2023    5:00 AM 11/11/2023    5:30 AM  CMP  Glucose 70 - 99 mg/dL 865  854  825   BUN 8 - 23 mg/dL 35  36  32   Creatinine 0.61 - 1.24 mg/dL 7.94  7.75  7.92   Sodium 135 - 145 mmol/L 129  128  126  Potassium 3.5 - 5.1 mmol/L 5.0  4.9  4.9   Chloride 98 - 111 mmol/L 106  104  107   CO2 22 - 32 mmol/L 17  17  16    Calcium  8.9 - 10.3 mg/dL 8.5  8.8  8.1    No results found.    ASSESSMENT/PLAN:  Assessment: Principal Problem:   Osteomyelitis of right foot (HCC) Active Problems:   CAD (coronary artery disease)   Hyperkalemia   AKI (acute kidney injury) (HCC)   Anemia   LV (left ventricular) mural thrombus without MI (HCC)   Hyponatremia   HFrEF (heart failure with reduced ejection fraction) (HCC)   Chronic osteomyelitis involving lower leg, right (HCC)   Frailty   Urinary retention   Generalized weakness   Sinus tachycardia   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 (HCC)   Chronic anemia   Hypoalbuminemia   Monoclonal gammopathy   Plan:  Chronic osteomyelitis R foot PAD Pt has long history of persistent RLE osteomyelitis. This hospitalization it has continued to worsen. He was not on medications prior to admission and has not wished to treat it in the past.  -Ortho  recommended BKA which is scheduled for 7/18 with Dr. Harden -Continue Doxycycline  and Augmentin  for suppression due to Step anginosis culture and sensitivities last wound culture.  Elevated Protein gap  SPEP with Positive M-Spike SPEP with gamma globulin 2.1 and Mspike 1.1. Kappa/Lambda light chain ratio 0.58. IgG elevated at 2,664 with lamba specificity, UPEP bence jones protein positive.  -This could also be contributing to hyponatremia in form of a pseudohyponatremia.  -Will consult hematology this week.   Hyponatremia Likely multifactorial, including cardiorenal syndrome, AKI, bladder outlet obstruction, and hypovolemia. Pt continues to mildly improve with fluids, but this is not sustained. Could potentially be due to pseudohyponatremia due to presence of monoclonal gammopathy.   Sepsis Possible PNA -Resolved.  -Continue Doxycycline  and augmentin  for osteomyelitis suppression.   AKI Bladder outlet obstruction with BL hydroureter UTI NAGMA Since admission, AKI has significantly improved. Continued increased urine output. Possible post ATN diuresis. Will keep coude catheter in place. Urine culture negative. Will continue to hold fluids today as pts oral intake has been adequate.  Biventricular HFrEF EF 20-25%.  Hx of nonobstructive CAD. Nonadherent to medications. Not a candidate for advanced therapies. Suspicion of prior MI but none documented in the chart. Remains peripherally euvolemic. Pt has not had anymore episodes of tachypnea or orthopnea - Monitor electrolytes; goal Mg 2, K ~4. - Monitor urine output. -Cont Metoprolol  12.5  Hx of LV thrombus (not visualized on TTE and MR 10/2023) New subscapular splenic infarct Nonadherent to Eliquis  at home. Eliquis  held due to downtrending Hgb. Will continue to hold due to anemia, fluctuating Hgb, and potential BKA this week. -Lovenox  for VTE prophylaxis  Acute on chronic anemia -Low sat ratios during last hospitalization.  -S/p IV Iron   7/8. Will continue daily CBC's. Transfuse if Hbg <7  Encephalopathy Delirium Cefepime  neurotoxicity Resolved.   Multisystem organ failure Goals of care Palliative Consult Severe protein calorie malnutrition -Discussed multisystem organ failure and recurrent hospitalizations -Palliative care saw pt and pt expressed desire to continue to be full code, full scope.  -At this time, he and his family wish to continue aggressive care including BKA.   -Per PT eval, pt to SNF at discharge.   Best Practice: Diet: Renal diet IVF: Fluids: none VTE: Lovenox  Code: Full  Disposition planning: Therapy Recs: Pending, DME: other pending  DISPO: Anticipated discharge pending PT eval pending clinical improvement and R BKA.  Signature:  Schuyler Novak, DO [facility]  1:17 PM, 11/13/2023  On Call pager [phone]

## 2023-11-13 NOTE — Progress Notes (Signed)
 Occupational Therapy Treatment Patient Details Name: Derek Thompson MRN: [id number] DOB: 06/25/1960 Today's Date: 11/13/2023   History of present illness Pt is a 63 y/o M presenting to ED on 11/02/23 with lethargy. Pt found to be tachycardic, hyperkalemic, and hyponatremia. Recent hospital stay in June. PMH: urinary retention, HTN, DM2, CVA in February 2025, anemia, prostate cancer with chronic incontinence following radiation therapy, recent osteomyelitis of the right foot and LV thrombus; s/p R foot 1st and 2nd ray amputation, HFrEF   OT comments  Pt in bed upon therapy arrival and agreeable to participate in OT treatment session. Pt with flat effect during session and demonstrated increased fatigue. Pt education provided on what to except after R BKA from a mobility/therapy standpoint. Session to focus on attempting to complete sit to stands while non-weightbearing to RLE. Pt verbalized understanding after being provided a visual demonstration and verbal instructions although unable to demonstrate carry over. Pt requested to finish session early once seated on EOB. Pt continues to be appropriate for continued inpatient follow up therapy, <3 hours/day.       If plan is discharge home, recommend the following:  A little help with walking and/or transfers;A little help with bathing/dressing/bathroom;Help with stairs or ramp for entrance   Equipment Recommendations  None recommended by OT       Precautions / Restrictions Precautions Precautions: Fall;Other (comment) Recall of Precautions/Restrictions: Impaired Precaution/Restrictions Comments: R post op shoe Restrictions Weight Bearing Restrictions Per Provider Order: No Other Position/Activity Restrictions: has a post op shoe for R foot       Mobility Bed Mobility Overal bed mobility: Needs Assistance Bed Mobility: Rolling, Sit to Supine, Sidelying to Sit Rolling: Modified independent (Device/Increase time) Sidelying to sit:  Supervision Supine to sit: Supervision     General bed mobility comments: Able to complete bed mobility without any physical assistance. Required VC to initiate task.    Transfers Overall transfer level: Needs assistance Equipment used: Rolling walker (2 wheels) Transfers: Sit to/from Stand Sit to Stand: Contact guard assist, From elevated surface           General transfer comment: VC provided for hand placement with RW management prior to sit to stand transition. Pt education provided on what to except from a mobility/therapy standpoint after BKA on 7/18. Discussed use of limb protector and change in weightbearing status. Pt educated on completing sit to stand transition while maintaining NWB to RLE in order to prepare for surgery. Pt unable to demonstrate carry over although verbalized understanding. Pt completed several lateral side steps to the left towards HOB using RW before sitting on EOB.     Balance Overall balance assessment: Needs assistance Sitting-balance support: No upper extremity supported, Feet supported Sitting balance-Leahy Scale: Good     Standing balance support: Bilateral upper extremity supported, During functional activity Standing balance-Leahy Scale: Poor      ADL either performed or assessed with clinical judgement              Communication Communication Communication: No apparent difficulties   Cognition Arousal: Alert Behavior During Therapy: Flat affect Cognition: Cognition impaired     Awareness: Intellectual awareness impaired, Online awareness impaired   Attention impairment (select first level of impairment): Sustained attention Executive functioning impairment (select all impairments): Initiation, Organization, Sequencing, Reasoning, Problem solving      Following commands impaired: Follows one step commands with increased time               General Comments cloudy  urine noted in catheter bag. R foot dressing intact     Pertinent Vitals/ Pain       Pain Assessment Pain Assessment: No/denies pain Pain Score: 0-No pain         Frequency  Min 2X/week        Progress Toward Goals  OT Goals(current goals can now be found in the care plan section)  Progress towards OT goals: Progressing toward goals            AM-PAC OT 6 Clicks Daily Activity     Outcome Measure   Help from another person eating meals?: Total Help from another person taking care of personal grooming?: A Little Help from another person toileting, which includes using toliet, bedpan, or urinal?: Total Help from another person bathing (including washing, rinsing, drying)?: A Lot Help from another person to put on and taking off regular upper body clothing?: A Little Help from another person to put on and taking off regular lower body clothing?: A Lot 6 Click Score: 12    End of Session Equipment Utilized During Treatment: Gait belt;Rolling walker (2 wheels)  OT Visit Diagnosis: Unsteadiness on feet (R26.81);Other abnormalities of gait and mobility (R26.89);Muscle weakness (generalized) (M62.81);Other symptoms and signs involving cognitive function;Pain Pain - Right/Left: Right Pain - part of body: Ankle and joints of foot   Activity Tolerance Patient limited by fatigue   Patient Left in bed;with call bell/phone within reach;with bed alarm set           Time: 0913-0930 OT Time Calculation (min): 17 min  Charges: OT General Charges $OT Visit: 1 Visit OT Treatments $Self Care/Home Management : 8-22 mins  Leita Howell, OTR/L,CBIS  Supplemental OT - [facility] and [facility] Secure Chat Preferred    Eily Louvier, Leita BIRCH 11/13/2023, 9:38 AM

## 2023-11-13 NOTE — TOC Progression Note (Signed)
 Transition of Care [facility]) - Progression Note    Patient Details  Name: Derek Thompson MRN: [id number] Date of Birth: 07-11-1960  [facility] CM/SW Contact  [name], [place] Phone Number: 11/13/2023, 3:43 PM  Clinical Narrative:   CSW met patient at bedside and provided medicare.gov rating for accepting bed offer, [facility]. Patient is agreeable to going to that SNF. CSW notified facility and his mother.   TOC will continue to follow.      Expected Discharge Plan: Skilled Nursing Facility Barriers to Discharge: Continued Medical Work up  Expected Discharge Plan and Services In-house Referral: Clinical Social Work     Living arrangements for the past 2 months: Homeless Shelter                                       Social Determinants of Health (SDOH) Interventions SDOH Screenings   Food Insecurity: No Food Insecurity (11/02/2023)  Housing: High Risk (11/02/2023)  Transportation Needs: Unmet Transportation Needs (11/02/2023)  Utilities: Not At Risk (11/02/2023)  Alcohol Screen: Low Risk  (08/19/2022)  Depression (PHQ2-9): Low Risk  (10/11/2023)  Financial Resource Strain: Low Risk  (08/19/2022)  Tobacco Use: Medium Risk (11/02/2023)    Readmission Risk Interventions     No data to display

## 2023-11-14 ENCOUNTER — Ambulatory Visit: Admitting: Licensed Clinical Social Worker

## 2023-11-14 DIAGNOSIS — D472 Monoclonal gammopathy: Secondary | ICD-10-CM

## 2023-11-14 DIAGNOSIS — M86671 Other chronic osteomyelitis, right ankle and foot: Secondary | ICD-10-CM | POA: Diagnosis not present

## 2023-11-14 LAB — CBC
HCT: 27.9 % — ABNORMAL LOW (ref 39.0–52.0)
Hemoglobin: 9 g/dL — ABNORMAL LOW (ref 13.0–17.0)
MCH: 27.7 pg (ref 26.0–34.0)
MCHC: 32.3 g/dL (ref 30.0–36.0)
MCV: 85.8 fL (ref 80.0–100.0)
Platelets: 132 K/uL — ABNORMAL LOW (ref 150–400)
RBC: 3.25 MIL/uL — ABNORMAL LOW (ref 4.22–5.81)
RDW: 16.4 % — ABNORMAL HIGH (ref 11.5–15.5)
WBC: 5.9 K/uL (ref 4.0–10.5)
nRBC: 0 % (ref 0.0–0.2)

## 2023-11-14 LAB — RENAL FUNCTION PANEL
Albumin: 1.9 g/dL — ABNORMAL LOW (ref 3.5–5.0)
Anion gap: 8 (ref 5–15)
BUN: 35 mg/dL — ABNORMAL HIGH (ref 8–23)
CO2: 17 mmol/L — ABNORMAL LOW (ref 22–32)
Calcium: 8.7 mg/dL — ABNORMAL LOW (ref 8.9–10.3)
Chloride: 104 mmol/L (ref 98–111)
Creatinine, Ser: 1.98 mg/dL — ABNORMAL HIGH (ref 0.61–1.24)
GFR, Estimated: 37 mL/min — ABNORMAL LOW (ref >60)
Glucose, Bld: 173 mg/dL — ABNORMAL HIGH (ref 70–99)
Phosphorus: 2.3 mg/dL — ABNORMAL LOW (ref 2.5–4.6)
Potassium: 4.9 mmol/L (ref 3.5–5.1)
Sodium: 129 mmol/L — ABNORMAL LOW (ref 135–145)

## 2023-11-14 LAB — GLUCOSE, CAPILLARY
Glucose-Capillary: 170 mg/dL — ABNORMAL HIGH (ref 70–99)
Glucose-Capillary: 187 mg/dL — ABNORMAL HIGH (ref 70–99)
Glucose-Capillary: 190 mg/dL — ABNORMAL HIGH (ref 70–99)
Glucose-Capillary: 218 mg/dL — ABNORMAL HIGH (ref 70–99)

## 2023-11-14 LAB — MAGNESIUM: Magnesium: 1.6 mg/dL — ABNORMAL LOW (ref 1.7–2.4)

## 2023-11-14 MED ORDER — METOPROLOL TARTRATE 25 MG PO TABS: 25.0000 mg | ORAL_TABLET | Freq: Once | ORAL | Status: DC

## 2023-11-14 MED ORDER — SODIUM PHOSPHATES 45 MMOLE/15ML IV SOLN
30.0000 mmol | Freq: Once | INTRAVENOUS | Status: AC
  Administered 2023-11-14: 30 mmol via INTRAVENOUS
  Filled 2023-11-14: qty 10

## 2023-11-14 MED ORDER — METOPROLOL TARTRATE 12.5 MG HALF TABLET
12.5000 mg | ORAL_TABLET | Freq: Once | ORAL | Status: AC
  Administered 2023-11-14: 12.5 mg via ORAL
  Filled 2023-11-14: qty 1

## 2023-11-14 MED ORDER — LACTATED RINGERS IV BOLUS
250.0000 mL | Freq: Once | INTRAVENOUS | Status: AC
  Administered 2023-11-14: 250 mL via INTRAVENOUS

## 2023-11-14 MED ORDER — METOPROLOL TARTRATE 12.5 MG HALF TABLET: 12.5000 mg | ORAL_TABLET | Freq: Once | ORAL | Status: DC

## 2023-11-14 MED ORDER — MAGNESIUM SULFATE 4 GM/100ML IV SOLN
4.0000 g | Freq: Once | INTRAVENOUS | Status: AC
  Administered 2023-11-14: 4 g via INTRAVENOUS
  Filled 2023-11-14: qty 100

## 2023-11-14 MED ORDER — METOPROLOL TARTRATE 25 MG PO TABS
25.0000 mg | ORAL_TABLET | Freq: Two times a day (BID) | ORAL | Status: DC
  Administered 2023-11-14 – 2023-12-02 (×30): 25 mg via ORAL
  Filled 2023-11-14 (×36): qty 1

## 2023-11-14 NOTE — Progress Notes (Signed)
 Physical Therapy Treatment Patient Details Name: Derek Thompson MRN: [id number] DOB: 22-Jun-1960 Today's Date: 11/14/2023   History of Present Illness Pt is a 63 y/o M presenting to ED on 11/02/23 with lethargy. Pt found to be tachycardic, hyperkalemic, and hyponatremia. Recent hospital stay in June. 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    PT Comments  Pt received in supine, c/o fatigue but agreeable to bed-level session with emphasis on RLE HEP for supine/seated postures for strengthening RLE in anticipation of upcoming R BKA. Pt quick to fatigue but puts forth good effort as able. Pt c/o moderate to severe fatigue post-exertion and defers OOB transfer training as planned. Pt given HEP handout to reinforce. Patient will benefit from continued inpatient follow up therapy, <3 hours/day and plan to work on lateral scoot vs squat pivot transfers OOB to chair next session to work on RLE NWB status.     If plan is discharge home, recommend the following: A lot of help with bathing/dressing/bathroom;Supervision due to cognitive status;Help with stairs or ramp for entrance;A lot of help with walking and/or transfers;Assistance with cooking/housework;Assist for transportation   Can travel by private vehicle      (may need WC van or PTAR post-op)  Equipment Recommendations  None recommended by PT (TBD post-op)    Recommendations for Other Services       Precautions / Restrictions Precautions Precautions: Fall;Other (comment) Recall of Precautions/Restrictions: Impaired Precaution/Restrictions Comments: R post op shoe; watch HR Restrictions Weight Bearing Restrictions Per Provider Order: No Other Position/Activity Restrictions: has a post op shoe for R foot     Mobility  Bed  Mobility Overal bed mobility: Needs Assistance Bed Mobility: Supine to Sit     Supine to sit: Min assist, HOB elevated, Used rails     General bed mobility comments: HHA minA for supine to long sitting, pt c/o fatigue; pillows adjusted behind him while pt long sitting for comfort    Transfers                   General transfer comment: pt defers, c/o fatigue    Ambulation/Gait                   Stairs             Wheelchair Mobility     Tilt Bed    Modified Rankin (Stroke Patients Only)       Balance Overall balance assessment: Needs assistance Sitting-balance support: Single extremity supported, Feet unsupported Sitting balance-Leahy Scale: Fair Sitting balance - Comments: long sitting in bed; c/o fatigue after minimal upright seated posture                                    Communication Communication Communication: No apparent difficulties  Cognition Arousal: Alert Behavior During Therapy: Flat affect   PT - Cognitive impairments: Initiation, Problem solving, Sequencing, Attention                       PT - Cognition Comments: Slow processing and quick to fatigue with bed-level activity. Pt given handout to reinforce supine HEP instruction and education on post-op BKA management/phantom pain that he may experience. Following commands: Impaired Following  commands impaired: Follows one step commands with increased time    Cueing Cueing Techniques: Verbal cues, Tactile cues, Gestural cues  Exercises Other Exercises Other Exercises: supine BLE AROM: GS, QS, SLR (AA), SAQ, hip extension x10 reps ea Other Exercises: supine to long sitting with HHA, pt defers additional after first bed crunch due to c/o fatigue.    General Comments General comments (skin integrity, edema, etc.): R foot wrapped with gauze, no visible drainage; discussion on repositioning Q2H for pressure relief      Pertinent Vitals/Pain Pain  Assessment Pain Assessment: No/denies pain Pain Intervention(s): Monitored during session, Repositioned, Limited activity within patient's tolerance    Home Living                          Prior Function            PT Goals (current goals can now be found in the care plan section) Acute Rehab PT Goals Patient Stated Goal: To be able to move around again after R BKA surgery PT Goal Formulation: Patient unable to participate in goal setting Time For Goal Achievement: 11/21/23 Progress towards PT goals: Progressing toward goals (slowly)    Frequency    Min 2X/week      PT Plan      Co-evaluation              AM-PAC PT 6 Clicks Mobility   Outcome Measure  Help needed turning from your back to your side while in a flat bed without using bedrails?: A Little Help needed moving from lying on your back to sitting on the side of a flat bed without using bedrails?: A Little Help needed moving to and from a bed to a chair (including a wheelchair)?: A Little Help needed standing up from a chair using your arms (e.g., wheelchair or bedside chair)?: A Lot (unable to stand on one leg (pt is pending BKA this week)) Help needed to walk in hospital room?: Total Help needed climbing 3-5 steps with a railing? : Total 6 Click Score: 13    End of Session   Activity Tolerance: Patient limited by fatigue Patient left: in bed;with call bell/phone within reach;with bed alarm set Nurse Communication: Mobility status PT Visit Diagnosis: Unsteadiness on feet (R26.81);Difficulty in walking, not elsewhere classified (R26.2) Pain - Right/Left: Right Pain - part of body: Ankle and joints of foot     Time: 8477-8460 PT Time Calculation (min) (ACUTE ONLY): 17 min  Charges:    $Therapeutic Exercise: 8-22 mins PT General Charges $$ ACUTE PT VISIT: 1 Visit                     Derek Rathbun P., PTA Acute Rehabilitation Services Secure Chat Preferred 9a-5:30pm Office: [phone]     Connell HERO [facility] 11/14/2023, 4:02 PM

## 2023-11-14 NOTE — Progress Notes (Signed)
 Subjective:  Pt was seen after we were paged by the RN nurse. RN mentioned the pt's HR shot up suddenly into 150s. Pt was asymptomatic and hemodynamically stable. He denied any CP or SOB. He endorsed palpitations. 12-lead EKG was ordered to evaluate the pt's rate and rhythm.   Objective:  BP was stable around 123/87. Hr was between 145-155.  EKG showed a sinus tachycardia without any widened QRS complexes or irregular rhythms.   Physical Exam:  Gen: pt was alert and answered all our questions. MSK: He had good movement of upper and lower extremities. CV: no edema noted in lower extremities.  Assessment/Plan  Sinus tachyarrhythmia  Pt HR was consistently elevated in the 150's. He was asymptomatic except for the palpitations. His blood pressure was normal. We ordered a 12-lead EKG which showed narrow QRS complexes possibly due to SVT but no Afib. We strongly suspect sinus tachycardia to be the cause. Pt was given carotid massage, placed on trendelenburg position, and was asked to do the Valsalva maneuver. His HR did not decrease after these interventions. Pt was then given 500mL LR bolus. The HR was still elevated and started touching 160. We then gave him a dose of Lopressor  10mg  IV. Pt's HR came down to the 130s right away. We will keep a watch on him for any signs of deterioration or elevated HR. We will give him Adenosine if the HR begins to rise again. RRT was informed to be ready in the case of an adverse event.

## 2023-11-14 NOTE — Plan of Care (Signed)

## 2023-11-14 NOTE — Progress Notes (Signed)
 MEWS Progress Note  Patient Details Name: Derek Thompson MRN: [id number] DOB: 1960/10/22 Today's Date: 11/14/2023   MEWS Flowsheet Documentation:  Assess: MEWS Score Temp: 98.6 F (37 C) BP: (!) 91/34 MAP (mmHg): (!) 43 Pulse Rate: (!) 131 ECG Heart Rate: 100 Resp: 16 Level of Consciousness: Alert SpO2: 98 % O2 Device: Room Air Patient Activity (if Appropriate): In bed Assess: MEWS Score MEWS Temp: 0 MEWS Systolic: 1 MEWS Pulse: 3 MEWS RR: 0 MEWS LOC: 0 MEWS Score: 4 MEWS Score Color: Red Assess: SIRS CRITERIA SIRS Temperature : 0 SIRS Respirations : 0 SIRS Pulse: 1 SIRS WBC: 0 SIRS Score Sum : 1 SIRS Temperature : 0 SIRS Pulse: 1 SIRS Respirations : 0 SIRS WBC: 0 SIRS Score Sum : 1 Assess: if the MEWS score is Yellow or Red Were vital signs accurate and taken at a resting state?: Yes Does the patient meet 2 or more of the SIRS criteria?: No Does the patient have a confirmed or suspected source of infection?: Yes MEWS guidelines implemented : Yes, red Treat MEWS Interventions: Considered administering scheduled or prn medications/treatments as ordered Take Vital Signs Increase Vital Sign Frequency : Red: Q1hr x2, continue Q4hrs until patient remains green for 12hrs Escalate MEWS: Escalate: Red: Discuss with charge nurse and notify provider. Consider notifying RRT. If remains red for 2 hours consider need for higher level of care Notify: Charge Nurse/RN Name of Charge Nurse/RN Notified: [name], RN      [name] 11/14/2023, 1:46 AM

## 2023-11-14 NOTE — Progress Notes (Signed)
 HD#11 SUBJECTIVE:  Patient Summary: Derek Thompson is a 63 y.o. with a pertinent PMH of End-stage HFrEF, CKD, CVA, bladder outlet obstruction s/p coude foley catheter during last admission 10/2023, and chronic osteomyelitis , admitted hypovolemic hyponatremia and AKI with coude foley and on CTX, improving  Overnight Events: Last night, pt attempted to pull himself up in bed and experienced refractory tacchycardia afterwards. His EKG was consistent with previous ones this admission. His HR was in the 130's and he was asymptomatic. 10mg  of metoprolol  IV followed by 12.5mg  PO.    Interim History:  Pt seen and examined at the bedside this morning. He was in good spirits and listening to music. Discussed the episode last night and the patient was not concerned stating that he just overexerted himself which happens frequently. s  OBJECTIVE:  Vital Signs: Vitals:   11/14/23 0331 11/14/23 0500 11/14/23 0820 11/14/23 0854  BP: 114/74   97/64  Pulse: (!) 134   96  Resp: 18  15 19   Temp: 98.5 F (36.9 C)   98.1 F (36.7 C)  TempSrc: Oral   Oral  SpO2: 98%   100%  Weight:  63.3 kg    Height:       Supplemental O2: Room Air SpO2: 100 %  Filed Weights   11/02/23 1545 11/13/23 0426 11/14/23 0500  Weight: 69.2 kg 64.4 kg 63.3 kg     Intake/Output Summary (Last 24 hours) at 11/14/2023 1531 Last data filed at 11/14/2023 1516 Gross per 24 hour  Intake 868.94 ml  Output 1600 ml  Net -731.06 ml   Net IO Since Admission: -9,489.19 mL [11/14/23 1531]  Physical Exam: Const: Awake, alert in NAD HENT: Normocephalic, atraumatic, Card: Tacchycardic, No MRG, No pitting edema on LE's bilaterally  Resp: Decreased at the bases. Extremities: Warm. RLE bandage in place over foot with discoloration of the toes. Decreased skin turgor in bilateral lower extremities  Patient Lines/Drains/Airways Status     Active Line/Drains/Airways     Name Placement date Placement time Site Days   Peripheral  IV 11/03/23 22 G 1.75 Left;Proximal Forearm 11/03/23  0945  Forearm  1   Peripheral IV 11/04/23 20 G 1 Posterior;Right Forearm 11/04/23  0845  Forearm  less than 1   Urethral Catheter Mekides Nida, RN Coude 16 Fr. 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, reddened foot area with purulent drainage present 09/01/23  1721  Foot  64   Wound 11/02/23 1655 Vascular Ulcer Foot Anterior;Right 11/02/23  1655  Foot  2   Wound 11/02/23 1658 Vascular Ulcer Foot Right;Lateral 11/02/23  1658  Foot  2            Pertinent labs and imaging:      Latest Ref Rng & Units 11/14/2023    5:32 AM 11/12/2023    5:00 AM 11/11/2023    5:30 AM  CBC  WBC 4.0 - 10.5 K/uL 5.9  5.4  5.3   Hemoglobin 13.0 - 17.0 g/dL 9.0  8.8  8.6   Hematocrit 39.0 - 52.0 % 27.9  28.9  27.6   Platelets 150 - 400 K/uL 132  189  211        Latest Ref Rng & Units 11/14/2023    5:32 AM 11/13/2023    5:33 AM 11/12/2023    5:00 AM  CMP  Glucose 70 - 99  mg/dL 826  865  854   BUN 8 - 23 mg/dL 35  35  36   Creatinine 0.61 - 1.24 mg/dL 8.01  7.94  7.75   Sodium 135 - 145 mmol/L 129  129  128   Potassium 3.5 - 5.1 mmol/L 4.9  5.0  4.9   Chloride 98 - 111 mmol/L 104  106  104   CO2 22 - 32 mmol/L 17  17  17    Calcium  8.9 - 10.3 mg/dL 8.7  8.5  8.8    No results found.    ASSESSMENT/PLAN:  Assessment: Principal Problem:   Osteomyelitis of right foot (HCC) Active Problems:   CAD (coronary artery disease)   Hyperkalemia   AKI (acute kidney injury) (HCC)   Anemia   LV (left ventricular) mural thrombus without MI (HCC)   Hyponatremia   HFrEF (heart failure with reduced ejection fraction) (HCC)   Chronic osteomyelitis involving lower leg, right (HCC)   Frailty   Urinary retention   Generalized weakness   Sinus tachycardia   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 (HCC)   Chronic anemia    Hypoalbuminemia   Monoclonal gammopathy   Plan:  Chronic osteomyelitis R foot PAD Pt has long history of persistent RLE osteomyelitis. This hospitalization it has continued to worsen. He was not on medications prior to admission and has not wished to treat it in the past.  -Ortho recommended BKA which is scheduled for 7/18 with Dr. Harden -Continue Doxycycline  and Augmentin  for suppression due to Step anginosis culture and sensitivities last wound culture.  Elevated Protein gap  SPEP with Positive M-Spike SPEP with gamma globulin 2.1 and Mspike 1.1. Kappa/Lambda light chain ratio 0.58. IgG elevated at 2,664 with lamba specificity, UPEP bence jones protein positive.  -This could also be contributing to hyponatremia in form of a pseudohyponatremia.  -Dr. Autumn with hematology consulted today.   Hyponatremia Likely multifactorial, including cardiorenal syndrome, AKI, bladder outlet obstruction, and hypovolemia. Pt continues to mildly improve with fluids, but this is not sustained. Could potentially be due to pseudohyponatremia due to presence of monoclonal gammopathy.   Debility -Pt currently on hospital day 12 with decreased mobility and the vast majority of time spent in the bed. Will continue to encourage working with PT/OT and out of bed throughout the day.   Sepsis Possible PNA -Resolved.  -Continue Doxycycline  and augmentin  for osteomyelitis suppression.   AKI Bladder outlet obstruction with BL hydroureter UTI NAGMA Since admission, AKI has significantly improved. Continued increased urine output. Possible post ATN diuresis. Will keep coude catheter in place. Urine culture negative. Will continue to hold fluids today as pts oral intake has been adequate.  Biventricular HFrEF EF 20-25%.  Persistent Tacchycardia Hx of nonobstructive CAD. Nonadherent to medications. Not a candidate for advanced therapies. Suspicion of prior MI but none documented in the chart. Remains  peripherally euvolemic. Pt has not had anymore episodes of tachypnea or orthopnea - Monitor electrolytes; goal Mg 2, K ~4. - Monitor urine output. -Will increase metoprolol  to 25mg  BID   Hx of LV thrombus (not visualized on TTE and MR 10/2023) New subscapular splenic infarct Nonadherent to Eliquis  at home. Eliquis  held due to downtrending Hgb. Will continue to hold due to anemia, fluctuating Hgb, and potential BKA this week. -Lovenox  for VTE prophylaxis  Acute on chronic anemia -Low sat ratios during last hospitalization.  -S/p IV Iron  7/8. Will continue daily CBC's. Transfuse if Hbg <7  Encephalopathy Delirium Cefepime   neurotoxicity Resolved.   Multisystem organ failure Goals of care Palliative Consult Severe protein calorie malnutrition -Discussed multisystem organ failure and recurrent hospitalizations -Palliative care saw pt and pt expressed desire to continue to be full code, full scope.  -At this time, he and his family wish to continue aggressive care including BKA.   -Per PT eval, pt to SNF at discharge.   Best Practice: Diet: Renal diet IVF: Fluids: none VTE: Lovenox  Code: Full  Disposition planning: Therapy Recs: Pending, DME: other pending DISPO: Anticipated discharge pending PT eval pending clinical improvement and R BKA.  Signature:  Schuyler Novak, DO [facility]  3:31 PM, 11/14/2023  On Call pager [phone]

## 2023-11-14 NOTE — Progress Notes (Signed)
 Re-evaluated the pt. He was asymptomatic. Pt had received a bolus of LR and 10 mg IV metoprolol . His BP was 113/71 with HR in the 130s. We did the carotid massage on him and asked him to do the valsalva maneuver. His HR would not go down. We consulted Dr. Rosan who advised us  to give him more Metoprolol  as BP is stabilized. Pt received 12.5 mg of Metoprolol  oral.   We will reevaluate in an hour

## 2023-11-14 NOTE — Progress Notes (Signed)
 Pt tachycardiac, sustained HR 150-160s, pt asymptomatic other than feeling like heart racing, MD notified and at bedside < , EKG performed, LR bolus given, CN made aware and rapid nurse notified. After interventions pt HR 133 at this time (0014). Call bell in reach, will monitor closely

## 2023-11-14 NOTE — Consult Note (Addendum)
  [facility]  [phone] [phone]    HEMATOLOGY CONSULTATION  PURPOSE OF CONSULTATION/CHIEF COMPLAINT: Abnormal proteins with M-spike  Referring MD:  Dr. Schuyler Novak   HPI: Derek Thompson is a 63 year old male patient who was admitted on 11/02/2023 from a homeless shelter due to lethargy. Workup has been done since admission and patient found to have abnormal proteins with M spike 1.1 and elevated IgG level.  UPEP Bence-Jones protein positive.  Therefore hematology/oncology consult has been requested. Patient is seen, assessed and examined today.  He is awake alert oriented laying in bed with Foley catheter intact and has himself uncovered during entire examination.  When asked why he is in the hospital, patient states he has no complaints and was brought here to check himself out. Medical history includes heart failure, CKD, CVA, bladder outlet obstruction, RLE chronic osteomyelitis.   Oncologic history significant for prostate cancer, underwent prostate biopsy 08/18/2020. Surgical history Family history significant for father with prostate cancer who was also treated with radiation therapy. Social history is significant for admission of smoking and alcohol in the past although he is vague about time.  Noted in chart that patient smoked for 30 years, 1 pack/day.  Also admits to former marijuana and cocaine use stating he has stopped drug use.  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 Asked patient if there are relatives that he would like us  to discuss his case with, he stated there is no one.  Noted patient's mother is next of kin in his chart.   ASSESSMENT AND PLAN:   Abnormal protein M spike+ -Suspicion for plasma cell disorder -Labs done this admission show: SPEP with gamma globulin 2.1, Mspike 1.1. Kappa/Lambda light chain ratio 0.58. IgG elevated at 2,664 with lamba specificity, UPEP bence jones protein positive. - Discussed consideration  for further testing including but not limited to bone scans, and bone marrow biopsy with patient.  He is agreeable to further testing as needed.    - Dr. Alieah Brinton/medical oncology will make further evaluation and treatment recommendations  History of prostate cancer, [id number]  - Diagnosed 2022 - Status post brachytherapy in 2022 - Continue follow-up with urology per previous instructions  Anemia, normocytic - Hemoglobin 9.0 today.  Baseline appears to be in the 9 range - Transfuse PRBC for Hgb <7.0.  No intervention required at this time. - Continue to monitor CBC with differential  Thrombocytopenia - Mild - Platelets 132K today.  No intervention required at this time. - Continue to monitor CBC with differential  CKD - Improving - Avoid nephrotoxic agents - Continue to monitor renal function  Chronic osteomyelitis -- RLE -- Continue care per Medicine   Past Medical History:  Diagnosis Date   Anemia    Diabetes mellitus without complication (HCC) 1987   Family history of breast cancer    Hypertension    Myocardial infarction [facility])    Prostate cancer (HCC)   :  Past Surgical History:  Procedure Laterality Date   ABDOMINAL AORTOGRAM W/LOWER EXTREMITY N/A 06/16/2023   Procedure: ABDOMINAL AORTOGRAM W/LOWER EXTREMITY;  Surgeon: Magda Debby SAILOR, MD;  Location: [facility] INVASIVE CV LAB;  Service: Cardiovascular;  Laterality: N/A;   AMPUTATION Right 06/21/2023   Procedure: RIGHT 2ND RAY AMPUTATION;  Surgeon: Harden Jerona GAILS, MD;  Location: [facility] OR;  Service: Orthopedics;  Laterality: Right;   DENTAL SURGERY     RADIOACTIVE SEED IMPLANT N/A 02/04/2021   Procedure: RADIOACTIVE SEED IMPLANT/BRACHYTHERAPY IMPLANT;  Surgeon: Selma Donnice SAUNDERS, MD;  Location: [facility] ORS;  Service: Urology;  Laterality: N/A;   RIGHT/LEFT HEART CATH AND CORONARY ANGIOGRAPHY N/A 05/25/2020   Procedure: RIGHT/LEFT HEART CATH AND CORONARY ANGIOGRAPHY;  Surgeon: [place], Peter M, MD;  Location: [facility] INVASIVE CV LAB;  Service:  Cardiovascular;  Laterality: N/A;   RIGHT/LEFT HEART CATH AND CORONARY ANGIOGRAPHY N/A 10/14/2022   Procedure: RIGHT/LEFT HEART CATH AND CORONARY ANGIOGRAPHY;  Surgeon: Cherrie Toribio SAUNDERS, MD;  Location: [facility] INV[facility]IVE CV LAB;  Service: Cardiovascular;  Laterality: N/A;   SPACE OAR INSTILLATION N/A 02/04/2021   Procedure: SPACE OAR INSTILLATION;  Surgeon: Selma Donnice SAUNDERS, MD;  Location: [facility] ORS;  Service: Urology;  Laterality: N/A;  :  No Known Allergies:   Family History  Problem Relation Age of Onset   Breast cancer Mother        dx 30s/40s, twice   Cancer Maternal Grandfather        unknown type, d. >50  :   Social History   Socioeconomic History   Marital status: Divorced    Spouse name: Not on file   Number of children: 3   Years of education: Not on file   Highest education level: Associate degree: academic program  Occupational History   Occupation: Training and development officer  Tobacco Use   Smoking status: Former    Current packs/day: 0.00    Average packs/day: 1 pack/day for 43.0 years (43.0 ttl pk-yrs)    Types: Cigarettes    Start date: 05/22/1974    Quit date: 05/22/2017    Years since quitting: 6.4   Smokeless tobacco: Never   Tobacco comments:    Quit 2019 about 1PPD  Vaping Use   Vaping status: Never Used  Substance and Sexual Activity   Alcohol use: Not Currently    Comment: last drink 2015   Drug use: Not Currently    Comment: none in 7 years   Sexual activity: Not Currently  Other Topics Concern   Not on file  Social History Narrative   3 sons   Social Drivers of Corporate investment banker Strain: Low Risk  (08/19/2022)   Overall Financial Resource Strain (CARDIA)    Difficulty of Paying Living Expenses: Not very hard  Food Insecurity: No Food Insecurity (11/02/2023)   Hunger Vital Sign    Worried About Running Out of Food in the Last Year: Never true    Ran Out of Food in the Last Year: Never true  Transportation Needs: Unmet Transportation Needs (11/02/2023)    PRAPARE - Administrator, Civil Service (Medical): Yes    Lack of Transportation (Non-Medical): Yes  Physical Activity: Not on file  Stress: Not on file  Social Connections: Not on file  Intimate Partner Violence: Not At Risk (11/02/2023)   Humiliation, Afraid, Rape, and Kick questionnaire    Fear of Current or Ex-Partner: No    Emotionally Abused: No    Physically Abused: No    Sexually Abused: No  :   CURRENT MEDS: Current Facility-Administered Medications  Medication Dose Route Frequency Provider Last Rate Last Admin   acetaminophen  (TYLENOL ) tablet 650 mg  650 mg Oral Q6H PRN Amilibia, Jaden, DO   650 mg at 11/05/23 2126   amoxicillin -clavulanate (AUGMENTIN ) 875-125 MG per tablet 1 tablet  1 tablet Oral Q12H Francesco Elsie NOVAK, MD   1 tablet at 11/14/23 9187   aspirin  EC tablet 81 mg  81 mg Oral Daily McLendon, Michael, MD   81 mg at 11/14/23 9187   Chlorhexidine  Gluconate  Cloth 2 % PADS 6 each  6 each Topical Daily Francesco Elsie NOVAK, MD   6 each at 11/14/23 0813   doxycycline  (VIBRA -TABS) tablet 100 mg  100 mg Oral Q12H Francesco Elsie NOVAK, MD   100 mg at 11/14/23 9187   enoxaparin  (LOVENOX ) injection 100 mg  100 mg Subcutaneous Q24H Francesco Elsie NOVAK, MD   100 mg at 11/14/23 0813   ezetimibe  (ZETIA ) tablet 10 mg  10 mg Oral Daily McLendon, Michael, MD   10 mg at 11/14/23 0813   feeding supplement (BOOST / RESOURCE BREEZE) liquid 1 Container  1 Container Oral TID BM Myrna Bitters, DO   1 Container at 11/12/23 2026   guaiFENesin  (MUCINEX ) 12 hr tablet 1,200 mg  1,200 mg Oral BID King, Olivia, DO   1,200 mg at 11/14/23 0813   insulin  aspart (novoLOG ) injection 0-5 Units  0-5 Units Subcutaneous QHS Myrna Bitters, DO   2 Units at 11/13/23 2135   insulin  aspart (novoLOG ) injection 0-9 Units  0-9 Units Subcutaneous TID WC Myrna Bitters, DO   2 Units at 11/14/23 1210   metoprolol  tartrate (LOPRESSOR ) tablet 25 mg  25 mg Oral BID King, Olivia, DO       mupirocin  ointment (BACTROBAN )  2 %   Nasal BID Francesco Elsie NOVAK, MD   Given at 11/14/23 0815   Oral care mouth rinse  15 mL Mouth Rinse PRN Francesco Elsie NOVAK, MD       sodium phosphate  30 mmol in sodium chloride  0.9 % 250 mL infusion  30 mmol Intravenous Once King, Olivia, DO 43 mL/hr at 11/14/23 1213 30 mmol at 11/14/23 1213   tamsulosin  (FLOMAX ) capsule 0.4 mg  0.4 mg Oral QPC supper Norrine Sharper, MD   0.4 mg at 11/13/23 1650    PHYSICAL EXAMINATION: ECOG PERFORMANCE STATUS: 3 - Symptomatic, >50% confined to bed  Vitals:   11/14/23 0820 11/14/23 0854  BP:  97/64  Pulse:  96  Resp: 15 19  Temp:  98.1 F (36.7 C)  SpO2:  100%   Filed Weights   11/02/23 1545 11/13/23 0426 11/14/23 0500  Weight: 152 lb 8.9 oz (69.2 kg) 141 lb 15.6 oz (64.4 kg) 139 lb 8.8 oz (63.3 kg)    GENERAL: alert, no distress and comfortable SKIN: +pale skin color, texture, turgor are normal, no rashes or significant lesions EYES: normal, conjunctiva are pink and non-injected, sclera clear OROPHARYNX: no exudate, no erythema and lips, buccal mucosa, and tongue normal  NECK: supple, thyroid normal size, non-tender, without nodularity LYMPH: no palpable lymphadenopathy in the cervical, axillary or inguinal LUNGS: clear to auscultation and percussion with normal breathing effort HEART: regular rate & rhythm and no murmurs and no lower extremity edema ABDOMEN: abdomen soft, non-tender and normal bowel sounds +Foley cath intact MUSCULOSKELETAL: +RLE osteomyelitis PSYCH: alert & oriented x 3 with fluent speech NEURO: no focal motor/sensory deficits   LABS: Lab Results  Component Value Date   WBC 5.9 11/14/2023   HGB 9.0 (L) 11/14/2023   HCT 27.9 (L) 11/14/2023   MCV 85.8 11/14/2023   PLT 132 (L) 11/14/2023    Lab Results  Component Value Date   WBC 5.9 11/14/2023   HGB 9.0 (L) 11/14/2023   HCT 27.9 (L) 11/14/2023   PLT 132 (L) 11/14/2023   GLUCOSE 173 (H) 11/14/2023   CHOL 85 06/15/2023   TRIG 103 11/03/2023   HDL 10  (L) 06/15/2023   LDLCALC 49 06/15/2023   ALT 33 11/06/2023  AST 34 11/06/2023   NA 129 (L) 11/14/2023   K 4.9 11/14/2023   CL 104 11/14/2023   CREATININE 1.98 (H) 11/14/2023   BUN 35 (H) 11/14/2023   CO2 17 (L) 11/14/2023   INR 1.7 (H) 06/14/2023   HGBA1C 8.7 (H) 09/02/2023    CT HEAD WO CONTRAST ( ) Result Date: 11/08/2023 CLINICAL DATA:  Encephalopthy EXAM: CT HEAD WITHOUT CONTRAST TECHNIQUE: Contiguous axial images were obtained from the base of the skull through the vertex without intravenous contrast. RADIATION DOSE REDUCTION: This exam was performed according to the departmental dose-optimization program which includes automated exposure control, adjustment of the mA and/or kV according to patient size and/or use of iterative reconstruction technique. COMPARISON:  MRI of the head dated June 14, 2023 and CT of the head dated June 13, 2023. FINDINGS: Brain: Age-related atrophy and chronic encephalomalacia changes within the right parietal lobe. Chronic lacunar infarct within the right caudate. Chronic infarcts are also noted within the cerebellar hemispheres bilaterally. No evidence of hemorrhage, mass or acute cortical infarction. Vascular: Atheromatous calcifications within the carotid siphons. Skull: Intact and unremarkable. Sinuses/Orbits: No acute process. Other: None. IMPRESSION: 1. Chronic encephalomalacia changes within the right parietal lobe and cerebellar hemispheres bilaterally, with mild-to-moderate chronic cerebral white matter disease. No apparent acute process. Electronically Signed   By: Evalene Coho M.D.   On: 11/08/2023 16:27   DG Chest 1 View Result Date: 11/05/2023 CLINICAL DATA:  Cough EXAM: CHEST  1 VIEW COMPARISON:  11/02/2023 FINDINGS: Heart is borderline in size. Mediastinal contours within normal limits. Peribronchial thickening and interstitial prominence, worsening since prior study. No effusions or acute bony abnormality. IMPRESSION: Worsening  peribronchial thickening and interstitial prominence which could reflect edema, atypical infection or bronchitis. Electronically Signed   By: Franky Crease M.D.   On: 11/05/2023 21:00   CT RENAL STONE STUDY Result Date: 11/03/2023 CLINICAL DATA:  Abdominal flank pain.  Kidney stones suspected. EXAM: CT ABDOMEN AND PELVIS WITHOUT CONTRAST TECHNIQUE: Multidetector CT imaging of the abdomen and pelvis was performed following the standard protocol without IV contrast. RADIATION DOSE REDUCTION: This exam was performed according to the departmental dose-optimization program which includes automated exposure control, adjustment of the mA and/or kV according to patient size and/or use of iterative reconstruction technique. COMPARISON:  10/12/2023 FINDINGS: Lower chest: Dependent atelectasis. Hepatobiliary: No suspicious focal abnormality in the liver on this study without intravenous contrast. There is no evidence for gallstones, gallbladder wall thickening, or pericholecystic fluid. No intrahepatic or extrahepatic biliary dilation. Pancreas: No focal mass lesion. No dilatation of the main duct. No intraparenchymal cyst. No peripancreatic edema. Spleen: Calcified granulomata noted in the splenic parenchyma. In the relatively short interval since the prior study, the patient has developed a 2.9 cm subcapsular low-density lesion in the posterior spleen, probably an infarct given the rapid appearance. No overlying rib fracture but if there is a history of trauma, splenic injury would also be a consideration. Adrenals/Urinary Tract: No adrenal nodule or mass. No adrenal nodule or mass. Bilateral hydroureteronephrosis is similar to progressive in the interval. Circumferential bladder wall thickening evident. Stomach/Bowel: Stomach is unremarkable. No gastric wall thickening. No evidence of outlet obstruction. Duodenum is normally positioned as is the ligament of Treitz. No small bowel wall thickening. No small bowel dilatation.  The terminal ileum is normal. The appendix is normal. No gross colonic mass. No colonic wall thickening. Diverticular changes are noted in the left colon without evidence of diverticulitis. Vascular/Lymphatic: There is moderate atherosclerotic calcification of the  abdominal aorta without aneurysm. There is no gastrohepatic or hepatoduodenal ligament lymphadenopathy. No retroperitoneal or mesenteric lymphadenopathy. No pelvic sidewall lymphadenopathy. Reproductive: Brachytherapy seeds are seen in the prostate gland. Other: No intraperitoneal free fluid. Musculoskeletal: No worrisome lytic or sclerotic osseous abnormality. IMPRESSION: 1. Bilateral hydroureteronephrosis is similar to progressive in the interval. Circumferential bladder wall thickening evident. Imaging features compatible with bladder outlet obstruction. Similar moderate to large bladder volume. 2. In the relatively short interval since the prior study, the patient has developed a 2.9 cm subcapsular low-density lesion in the posterior spleen, probably an infarct given the rapid appearance. No overlying rib fracture but if there is a history of trauma, splenic injury would also be a consideration. No evidence for perisplenic fluid/hemorrhage. 3. Left colonic diverticulosis without diverticulitis. 4.  Aortic Atherosclerosis (ICD10-I70.0). Electronically Signed   By: Camellia Candle M.D.   On: 11/03/2023 07:43   DG Chest Port 1 View Result Date: 11/02/2023 CLINICAL DATA:  Generalized weakness and fatigue, history of osteomyelitis EXAM: PORTABLE CHEST 1 VIEW COMPARISON:  Chest x-ray June 27, 2023. FINDINGS: The heart size and mediastinal contours are within normal limits. Increased interstitial markings of both lung fields. No consolidation or suspicious pulmonary nodule. No pleural effusion or pneumothorax. The visualized skeletal structures are unremarkable. IMPRESSION: No new consolidation or suspicious nodule. Findings are stable to prior chest  radiograph. Electronically Signed   By: Megan  Zare M.D.   On: 11/02/2023 12:12     The total time spent in the appointment was 55 minutes encounter with patients including review of chart and various tests results, discussions about plan of care and coordination of care plan   All questions were answered. The patient knows to call the clinic with any problems, questions or concerns. No barriers to learning was detected.  Thank you for the courtesy of this consultation, Olam JINNY Brunner, NP  7/15/20251:49 PM  ADDENDUM:  Patient was personally and independently interviewed, examined and relevant elements of the history of present illness were reviewed in details and an assessment and plan was created. All elements of the patient's history of present illness, assessment and plan were discussed in detail with Olam JINNY Brunner, NP. The above documentation reflects our combined findings assessment and plan.   Briefly, 63 year old gentleman with history of CKD, heart failure, CVA, bladder outlet obstruction, right lower extremity chronic osteomyelitis, was admitted from a homeless shelter on 11/02/2023 after he presented with lethargy.  Our service was consulted because of the M spike on SPEP measuring 1.1 g/dL.  IgG is elevated at 2664.  Both kappa and lambda are elevated, consistent with CKD picture with ratio normal.  24-hour urine UPEP did show evidence of Bence-Jones proteinuria with M spike measuring 272 mg over 24 hours, 18% of the total protein in the urine.  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  He does have renal insufficiency and anemia.  Kidney function has been declining since February 2025.  No evidence of hypercalcemia.  Please consider bone marrow biopsy/aspiration by IR for further evaluation, when clinically feasible.  This will also help us  delineate if he has smoldering myeloma versus monoclonal gammopathy of renal significance versus actual  multiple myeloma.  We will check beta-2 microglobulin, LDH and uric acid with morning set of labs.  Patient is scheduled for BKA on 11/17/2023 for chronic osteomyelitis of right foot and peripheral arterial disease.  He will be going to a rehab facility after that.  We will plan  to see him in the clinic for follow-up with bone marrow biopsy/aspiration results and determine further course of action.  No systemic treatment will be initiated in the inpatient setting, should the workup suggest multiple myeloma picture.  X-ray bone survey will be needed to look for any bone lytic lesions, but this can be done in the outpatient setting.  Please call us  with any questions or concerns.  Chinita Patten, MD

## 2023-11-14 NOTE — Progress Notes (Signed)
 MEWS Progress Note  Patient Details Name: Derek Thompson MRN: [id number] DOB: 1961/01/08 Today's Date: 11/14/2023   MEWS Flowsheet Documentation:  Assess: MEWS Score Temp: 98.5 F (36.9 C) BP: 123/87 MAP (mmHg): 97 Pulse Rate: (!) 158 ECG Heart Rate: 100 Resp: 18 Level of Consciousness: Alert SpO2: 99 % O2 Device: Room Air Patient Activity (if Appropriate): In bed Assess: MEWS Score MEWS Temp: 0 MEWS Systolic: 0 MEWS Pulse: 3 MEWS RR: 0 MEWS LOC: 0 MEWS Score: 3 MEWS Score Color: Yellow Assess: SIRS CRITERIA SIRS Temperature : 0 SIRS Respirations : 0 SIRS Pulse: 1 SIRS WBC: 0 SIRS Score Sum : 1 SIRS Temperature : 0 SIRS Pulse: 1 SIRS Respirations : 0 SIRS WBC: 0 SIRS Score Sum : 1 Assess: if the MEWS score is Yellow or Red Were vital signs accurate and taken at a resting state?: Yes Does the patient meet 2 or more of the SIRS criteria?: No Does the patient have a confirmed or suspected source of infection?: Yes MEWS guidelines implemented : Yes, yellow Treat MEWS Interventions: Considered administering scheduled or prn medications/treatments as ordered Take Vital Signs Increase Vital Sign Frequency : Yellow: Q2hr x1, continue Q4hrs until patient remains green for 12hrs Escalate MEWS: Escalate: Yellow: Discuss with charge nurse and consider notifying provider and/or RRT Notify: Charge Nurse/RN Name of Charge Nurse/RN Notified: Nutritional therapist, RN Provider Notification Provider Name/Title: Tobie Gaines Date Provider Notified: 11/13/23 Time Provider Notified: 2323 Method of Notification: Page Notification Reason: Change in status Provider response: At bedside Date of Provider Response: 11/13/23 Time of Provider Response: 2324 Notify: Rapid Response Name of Rapid Response RN Notified: Levon, RN Date Rapid Response Notified: 11/13/23 Time Rapid Response Notified: 2345      [name] 11/14/2023, 12:16 AM

## 2023-11-15 ENCOUNTER — Encounter (HOSPITAL_COMMUNITY): Payer: Self-pay | Admitting: Internal Medicine

## 2023-11-15 DIAGNOSIS — M86671 Other chronic osteomyelitis, right ankle and foot: Secondary | ICD-10-CM | POA: Diagnosis not present

## 2023-11-15 DIAGNOSIS — D472 Monoclonal gammopathy: Secondary | ICD-10-CM | POA: Diagnosis not present

## 2023-11-15 LAB — GLUCOSE, CAPILLARY
Glucose-Capillary: 221 mg/dL — ABNORMAL HIGH (ref 70–99)
Glucose-Capillary: 255 mg/dL — ABNORMAL HIGH (ref 70–99)
Glucose-Capillary: 181 mg/dL — ABNORMAL HIGH (ref 70–99)
Glucose-Capillary: 258 mg/dL — ABNORMAL HIGH (ref 70–99)

## 2023-11-15 LAB — COMPREHENSIVE METABOLIC PANEL WITH GFR
ALT: 20 U/L (ref 0–44)
AST: 13 U/L — ABNORMAL LOW (ref 15–41)
Albumin: 2 g/dL — ABNORMAL LOW (ref 3.5–5.0)
Alkaline Phosphatase: 39 U/L (ref 38–126)
Anion gap: 9 (ref 5–15)
BUN: 34 mg/dL — ABNORMAL HIGH (ref 8–23)
CO2: 19 mmol/L — ABNORMAL LOW (ref 22–32)
Calcium: 8.6 mg/dL — ABNORMAL LOW (ref 8.9–10.3)
Chloride: 103 mmol/L (ref 98–111)
Creatinine, Ser: 2.03 mg/dL — ABNORMAL HIGH (ref 0.61–1.24)
GFR, Estimated: 36 mL/min — ABNORMAL LOW (ref >60)
Glucose, Bld: 146 mg/dL — ABNORMAL HIGH (ref 70–99)
Potassium: 4.9 mmol/L (ref 3.5–5.1)
Sodium: 131 mmol/L — ABNORMAL LOW (ref 135–145)
Total Bilirubin: 0.6 mg/dL (ref 0.0–1.2)
Total Protein: 7.1 g/dL (ref 6.5–8.1)

## 2023-11-15 LAB — MAGNESIUM: Magnesium: 2.4 mg/dL (ref 1.7–2.4)

## 2023-11-15 LAB — URIC ACID: Uric Acid, Serum: 4.8 mg/dL (ref 3.7–8.6)

## 2023-11-15 LAB — LACTATE DEHYDROGENASE: LDH: 119 U/L (ref 98–192)

## 2023-11-15 LAB — PHOSPHORUS: Phosphorus: 4.1 mg/dL (ref 2.5–4.6)

## 2023-11-15 MED ORDER — ONDANSETRON 4 MG PO TBDP
4.0000 mg | ORAL_TABLET | Freq: Once | ORAL | Status: AC
  Administered 2023-11-15: 4 mg via ORAL
  Filled 2023-11-15: qty 1

## 2023-11-15 NOTE — Progress Notes (Signed)
 Occupational Therapy Treatment Patient Details Name: Derek Thompson MRN: [id number] DOB: Apr 14, 1961 Today's Date: 11/15/2023   History of present illness Pt is a 63 y/o M presenting to ED on 11/02/23 with lethargy. Pt found to be tachycardic, hyperkalemic, and hyponatremia. Recent hospital stay in June. 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   OT comments  Pt in bed upon therapy arrival. Drop arm recliner brought in order to work on lateral scoot transfers in preparation for upcoming BKA scheduled this Friday. Patient verbalized fatigue and demonstrates decreased activity tolerance. Education provided on rate of muscle weakness while being hospitalized and ways to work on increasing deficits with therapy. Pivoted session to assist pt with toileting hygiene d/t to incontinence. Patient will benefit from continued inpatient follow up therapy, <3 hours/day.       If plan is discharge home, recommend the following:  A little help with walking and/or transfers;A little help with bathing/dressing/bathroom;Help with stairs or ramp for entrance   Equipment Recommendations  None recommended by OT       Precautions / Restrictions Precautions Precautions: Fall;Other (comment) Recall of Precautions/Restrictions: Impaired Precaution/Restrictions Comments: R post op shoe; watch HR Restrictions Weight Bearing Restrictions Per Provider Order: No       Mobility Bed Mobility Overal bed mobility: Needs Assistance Bed Mobility: Supine to Sit, Sit to Supine Rolling: Modified independent (Device/Increase time)   Supine to sit: Supervision, HOB elevated, Used rails Sit to supine: Supervision   General bed mobility comments: VC required to initiate task    Transfers Overall transfer level:  Needs assistance Equipment used: Rolling walker (2 wheels) Transfers: Sit to/from Stand, Bed to chair/wheelchair/BSC Sit to Stand: Supervision     Step pivot transfers: Supervision     General transfer comment: Initially attempted to practice lateral scoot transfer from bed to drop arm recliner. Pt provided with verbal instructions and visual demonstration twice before attempting. Completed 2 scoots towards left side then verbalized that he was tired. Education provided on the rate of muscle loss from bed rest and lack of activity and how to increase while working with therapy. Noted soiled bed pad and pivoted session to assist pt with toileting and toilet hygiene with use of BSC.     Balance Overall balance assessment: Needs assistance Sitting-balance support: Single extremity supported, Feet unsupported Sitting balance-Leahy Scale: Fair     Standing balance support: Bilateral upper extremity supported, During functional activity Standing balance-Leahy Scale: Poor     ADL either performed or assessed with clinical judgement   ADL  Lower Body Dressing: Minimal assistance;Sit to/from stand;Bed level (total assist to don R foot post op shoe. While seated EOB, pt donned/doffed slip on sandel for L foot using figure 4 position.)   Toilet Transfer: Rolling walker (2 wheels);BSC/3in1;Cueing for safety;Supervision/safety   Toileting- Clothing Manipulation and Hygiene: Total assistance;Sit to/from stand               Communication Communication Communication: No apparent difficulties   Cognition Arousal: Alert Behavior During Therapy: Flat affect Cognition: Cognition impaired     Awareness: Intellectual awareness impaired, Online awareness impaired Memory impairment (select all impairments): Short-term memory, Working memory Attention impairment (select first level of impairment): Sustained attention Executive functioning impairment (select all impairments): Initiation,  Organization, Sequencing, Reasoning, Problem solving    Following commands:  Impaired Following commands impaired: Follows one step commands with increased time      Cueing   Cueing Techniques: Verbal cues, Tactile cues, Gestural cues             Pertinent Vitals/ Pain       Pain Assessment Pain Assessment: No/denies pain         Frequency  Min 2X/week        Progress Toward Goals  OT Goals(current goals can now be found in the care plan section)  Progress towards OT goals: Not progressing toward goals - comment (slow progress d/t decreased activity tolerance)            AM-PAC OT 6 Clicks Daily Activity     Outcome Measure   Help from another person eating meals?: A Little Help from another person taking care of personal grooming?: A Little Help from another person toileting, which includes using toliet, bedpan, or urinal?: Total Help from another person bathing (including washing, rinsing, drying)?: A Lot Help from another person to put on and taking off regular upper body clothing?: A Little Help from another person to put on and taking off regular lower body clothing?: A Lot 6 Click Score: 14    End of Session Equipment Utilized During Treatment: Rolling walker (2 wheels)  OT Visit Diagnosis: Unsteadiness on feet (R26.81);Other abnormalities of gait and mobility (R26.89);Muscle weakness (generalized) (M62.81);Other symptoms and signs involving cognitive function;Pain Pain - Right/Left: Right Pain - part of body: Ankle and joints of foot   Activity Tolerance Patient limited by fatigue   Patient Left in bed;with call bell/phone within reach;with bed alarm set           Time: 1125-1200 OT Time Calculation (min): 35 min  Charges: OT General Charges $OT Visit: 1 Visit OT Treatments $Self Care/Home Management : 23-37 mins  Leita Howell, OTR/L,CBIS  Supplemental OT - [facility] and [facility] Secure Chat Preferred    Veronica Fretz, Leita BIRCH 11/15/2023, 1:36  PM

## 2023-11-15 NOTE — Consult Note (Signed)
Chief Complaint: M spike; anemia - image guided bone marrow biopsy and aspiration   Referring Provider(s): Rosan Pride   Supervising Physician: Philip Cornet  Patient Status: [facility] - In-pt  History of Present Illness: Derek Thompson is a 63 y.o. male with history of anemia, diabetes mellitus, hypertension, and prostate cancer.  Pt presented to the emergency department 11/02/23 due to lethargy. Upon emergency department workup, pt was found to have AKI, right foot ulcer, hyperkalemia, and several other diagnoses and was subsequently admitted.  Upon further workup, pt was found to have elevated proteins with M spike and elevated IgG level.  Interventional radiology was consulted for image guided bone marrow biopsy and aspiration for further diagnostic workup.     Patient is Full Code  Past Medical History:  Diagnosis Date   Anemia    Diabetes mellitus without complication (HCC) 1987   Family history of breast cancer    Hypertension    Myocardial infarction [facility])    Prostate cancer [facility])     Past Surgical History:  Procedure Laterality Date   ABDOMINAL AORTOGRAM W/LOWER EXTREMITY N/A 06/16/2023   Procedure: ABDOMINAL AORTOGRAM W/LOWER EXTREMITY;  Surgeon: Magda Debby SAILOR, MD;  Location: [facility] INVASIVE CV LAB;  Service: Cardiovascular;  Laterality: N/A;   AMPUTATION Right 06/21/2023   Procedure: RIGHT 2ND RAY AMPUTATION;  Surgeon: Harden Jerona GAILS, MD;  Location: [facility] OR;  Service: Orthopedics;  Laterality: Right;   DENTAL SURGERY     RADIOACTIVE SEED IMPLANT N/A 02/04/2021   Procedure: RADIOACTIVE SEED IMPLANT/BRACHYTHERAPY IMPLANT;  Surgeon: Selma Donnice SAUNDERS, MD;  Location: [facility] ORS;  Service: Urology;  Laterality: N/A;   RIGHT/LEFT HEART CATH AND CORONARY ANGIOGRAPHY N/A 05/25/2020   Procedure: RIGHT/LEFT HEART CATH AND CORONARY ANGIOGRAPHY;  Surgeon: [place]az[place], Peter M, MD;  Location: [facility] INVASIVE CV LAB;  Service: Cardiovascular;  Laterality: N/A;   RIGHT/LEFT HEART CATH AND CORONARY  ANGIOGRAPHY N/A 10/14/2022   Procedure: RIGHT/LEFT HEART CATH AND CORONARY ANGIOGRAPHY;  Surgeon: Cherrie Toribio SAUNDERS, MD;  Location: [facility] INVASIVE CV LAB;  Service: Cardiovascular;  Laterality: N/A;   SPACE OAR INSTILLATION N/A 02/04/2021   Procedure: SPACE OAR INSTILLATION;  Surgeon: Selma Donnice SAUNDERS, MD;  Location: [facility] ORS;  Service: Urology;  Laterality: N/A;    Allergies: Patient has no known allergies.  Medications: Prior to Admission medications   Medication Sig Start Date End Date Taking? Authorizing Provider  apixaban  (ELIQUIS ) 5 MG TABS tablet Take 1 tablet (5 mg total) by mouth 2 (two) times daily. Patient taking differently: Take 5 mg by mouth daily. 10/16/23 01/15/2024 Yes Bender, Damien, DO  aspirin  EC 81 MG tablet Take 1 tablet (81 mg total) by mouth daily. Swallow whole. 10/16/23  Yes Kandis Damien, DO  atorvastatin  (LIPITOR ) 80 MG tablet Take 1 tablet (80 mg total) by mouth daily. 10/16/23 01/01/2024 Yes Bender, Damien, DO  dapagliflozin  propanediol (FARXIGA ) 10 MG TABS tablet Take 1 tablet (10 mg total) by mouth daily before breakfast. 10/16/23  Yes Bender, Damien, DO  ezetimibe  (ZETIA ) 10 MG tablet Take 1 tablet (10 mg total) by mouth daily. 10/16/23  Yes Kandis Damien, DO  ferrous sulfate  325 (65 FE) MG tablet Take 1 tablet (325 mg total) by mouth daily. 10/16/23  Yes Kandis Damien, DO  furosemide  (LASIX ) 20 MG tablet Take 1 tablet (20 mg total) by mouth daily as needed for fluid or edema. Please take on tablet if your have shortness of breath, leg swelling, gain more than 3 pounds within one  day or gain more than 5 pounds within one week. 10/16/23 10/15/24 Yes Bender, Damien, DO  metFORMIN  (GLUCOPHAGE ) 500 MG tablet Take 2 tablets (1,000 mg total) by mouth 2 (two) times daily with a meal. Patient taking differently: Take 1,000 mg by mouth daily. 09/11/23 11/02/23 Yes Patel, Libby, DO  metoprolol  succinate (TOPROL -XL) 25 MG 24 hr tablet TAKE 1 TABLET BY MOUTH DAILY 10/16/23  Yes Kandis Damien, DO   Multiple Vitamin (MULTIVITAMIN) tablet Take 1 tablet by mouth daily.   Yes [provider]  spironolactone  (ALDACTONE ) 25 MG tablet Take 1 tablet (25 mg total) by mouth daily. 10/16/23 10/15/24 Yes Bender, Damien, DO  tamsulosin  (FLOMAX ) 0.4 MG CAPS capsule Take 1 capsule (0.4 mg total) by mouth daily after supper. 10/16/23  Yes Kandis Damien, DO  amoxicillin -clavulanate (AUGMENTIN ) 875-125 MG tablet Take 1 tablet by mouth every 12 (twelve) hours. Patient not taking: Reported on 11/02/2023 10/16/23   Kandis Damien, DO     Family History  Problem Relation Age of Onset   Breast cancer Mother        dx 30s/40s, twice   Cancer Maternal Grandfather        unknown type, d. >50    Social History   Socioeconomic History   Marital status: Divorced    Spouse name: Not on file   Number of children: 3   Years of education: Not on file   Highest education level: Associate degree: academic program  Occupational History   Occupation: Training and development officer  Tobacco Use   Smoking status: Former    Current packs/day: 0.00    Average packs/day: 1 pack/day for 43.0 years (43.0 ttl pk-yrs)    Types: Cigarettes    Start date: 05/22/1974    Quit date: 05/22/2017    Years since quitting: 6.4   Smokeless tobacco: Never   Tobacco comments:    Quit 2019 about 1PPD  Vaping Use   Vaping status: Never Used  Substance and Sexual Activity   Alcohol use: Not Currently    Comment: last drink 2015   Drug use: Not Currently    Comment: none in 7 years   Sexual activity: Not Currently  Other Topics Concern   Not on file  Social History Narrative   3 sons   Social Drivers of Corporate investment banker Strain: Low Risk  (08/19/2022)   Overall Financial Resource Strain (CARDIA)    Difficulty of Paying Living Expenses: Not very hard  Food Insecurity: No Food Insecurity (11/02/2023)   Hunger Vital Sign    Worried About Running Out of Food in the Last Year: Never true    Ran Out of Food in the Last Year: Never true   Transportation Needs: Unmet Transportation Needs (11/02/2023)   PRAPARE - Administrator, Civil Service (Medical): Yes    Lack of Transportation (Non-Medical): Yes  Physical Activity: Not on file  Stress: Not on file  Social Connections: Not on file     Review of Systems: A 12 point ROS discussed and pertinent positives are indicated in the HPI above.  All other systems are negative.  Review of Systems  Constitutional:  Negative for fatigue and fever.  HENT:  Negative for congestion and dental problem.   Respiratory:  Negative for cough, chest tightness, shortness of breath and wheezing.   Cardiovascular:  Negative for chest pain.  Gastrointestinal:  Negative for diarrhea, nausea and vomiting.  Neurological:  Negative for light-headedness and headaches.  Psychiatric/Behavioral:  Negative for  agitation, behavioral problems and confusion.     Vital Signs: BP 104/65   Pulse 91   Temp 98.3 F (36.8 C)   Resp 18   Ht 6' (1.829 m)   Wt 139 lb 8.8 oz (63.3 kg)   SpO2 98%   BMI 18.93 kg/m   Advance Care Plan: The advanced care place/surrogate decision maker was discussed at the time of visit and the patient did not wish to discuss or was not able to name a surrogate decision maker or provide an advance care plan.  Physical Exam Constitutional:      Appearance: Normal appearance.  HENT:     Head: Normocephalic and atraumatic.     Mouth/Throat:     Mouth: Mucous membranes are moist.  Cardiovascular:     Rate and Rhythm: Normal rate and regular rhythm.  Pulmonary:     Effort: Pulmonary effort is normal.     Breath sounds: Normal breath sounds.  Abdominal:     General: Bowel sounds are normal.     Palpations: Abdomen is soft.  Musculoskeletal:        General: Normal range of motion.     Cervical back: Normal range of motion.  Skin:    General: Skin is warm and dry.  Neurological:     Mental Status: He is alert and oriented to person, place, and time.   Psychiatric:        Mood and Affect: Mood normal.        Behavior: Behavior normal.     Imaging: CT HEAD WO CONTRAST ( ) Result Date: 11/08/2023 CLINICAL DATA:  Encephalopthy EXAM: CT HEAD WITHOUT CONTRAST TECHNIQUE: Contiguous axial images were obtained from the base of the skull through the vertex without intravenous contrast. RADIATION DOSE REDUCTION: This exam was performed according to the departmental dose-optimization program which includes automated exposure control, adjustment of the mA and/or kV according to patient size and/or use of iterative reconstruction technique. COMPARISON:  MRI of the head dated June 14, 2023 and CT of the head dated June 13, 2023. FINDINGS: Brain: Age-related atrophy and chronic encephalomalacia changes within the right parietal lobe. Chronic lacunar infarct within the right caudate. Chronic infarcts are also noted within the cerebellar hemispheres bilaterally. No evidence of hemorrhage, mass or acute cortical infarction. Vascular: Atheromatous calcifications within the carotid siphons. Skull: Intact and unremarkable. Sinuses/Orbits: No acute process. Other: None. IMPRESSION: 1. Chronic encephalomalacia changes within the right parietal lobe and cerebellar hemispheres bilaterally, with mild-to-moderate chronic cerebral white matter disease. No apparent acute process. Electronically Signed   By: Evalene Coho M.D.   On: 11/08/2023 16:27   DG Chest 1 View Result Date: 11/05/2023 CLINICAL DATA:  Cough EXAM: CHEST  1 VIEW COMPARISON:  11/02/2023 FINDINGS: Heart is borderline in size. Mediastinal contours within normal limits. Peribronchial thickening and interstitial prominence, worsening since prior study. No effusions or acute bony abnormality. IMPRESSION: Worsening peribronchial thickening and interstitial prominence which could reflect edema, atypical infection or bronchitis. Electronically Signed   By: Franky Crease M.D.   On: 11/05/2023 21:00   CT  RENAL STONE STUDY Result Date: 11/03/2023 CLINICAL DATA:  Abdominal flank pain.  Kidney stones suspected. EXAM: CT ABDOMEN AND PELVIS WITHOUT CONTRAST TECHNIQUE: Multidetector CT imaging of the abdomen and pelvis was performed following the standard protocol without IV contrast. RADIATION DOSE REDUCTION: This exam was performed according to the departmental dose-optimization program which includes automated exposure control, adjustment of the mA and/or kV according to patient size and/or use  of iterative reconstruction technique. COMPARISON:  10/12/2023 FINDINGS: Lower chest: Dependent atelectasis. Hepatobiliary: No suspicious focal abnormality in the liver on this study without intravenous contrast. There is no evidence for gallstones, gallbladder wall thickening, or pericholecystic fluid. No intrahepatic or extrahepatic biliary dilation. Pancreas: No focal mass lesion. No dilatation of the main duct. No intraparenchymal cyst. No peripancreatic edema. Spleen: Calcified granulomata noted in the splenic parenchyma. In the relatively short interval since the prior study, the patient has developed a 2.9 cm subcapsular low-density lesion in the posterior spleen, probably an infarct given the rapid appearance. No overlying rib fracture but if there is a history of trauma, splenic injury would also be a consideration. Adrenals/Urinary Tract: No adrenal nodule or mass. No adrenal nodule or mass. Bilateral hydroureteronephrosis is similar to progressive in the interval. Circumferential bladder wall thickening evident. Stomach/Bowel: Stomach is unremarkable. No gastric wall thickening. No evidence of outlet obstruction. Duodenum is normally positioned as is the ligament of Treitz. No small bowel wall thickening. No small bowel dilatation. The terminal ileum is normal. The appendix is normal. No gross colonic mass. No colonic wall thickening. Diverticular changes are noted in the left colon without evidence of diverticulitis.  Vascular/Lymphatic: There is moderate atherosclerotic calcification of the abdominal aorta without aneurysm. There is no gastrohepatic or hepatoduodenal ligament lymphadenopathy. No retroperitoneal or mesenteric lymphadenopathy. No pelvic sidewall lymphadenopathy. Reproductive: Brachytherapy seeds are seen in the prostate gland. Other: No intraperitoneal free fluid. Musculoskeletal: No worrisome lytic or sclerotic osseous abnormality. IMPRESSION: 1. Bilateral hydroureteronephrosis is similar to progressive in the interval. Circumferential bladder wall thickening evident. Imaging features compatible with bladder outlet obstruction. Similar moderate to large bladder volume. 2. In the relatively short interval since the prior study, the patient has developed a 2.9 cm subcapsular low-density lesion in the posterior spleen, probably an infarct given the rapid appearance. No overlying rib fracture but if there is a history of trauma, splenic injury would also be a consideration. No evidence for perisplenic fluid/hemorrhage. 3. Left colonic diverticulosis without diverticulitis. 4.  Aortic Atherosclerosis (ICD10-I70.0). Electronically Signed   By: Camellia Candle M.D.   On: 11/03/2023 07:43   DG Chest Port 1 View Result Date: 11/02/2023 CLINICAL DATA:  Generalized weakness and fatigue, history of osteomyelitis EXAM: PORTABLE CHEST 1 VIEW COMPARISON:  Chest x-ray June 27, 2023. FINDINGS: The heart size and mediastinal contours are within normal limits. Increased interstitial markings of both lung fields. No consolidation or suspicious pulmonary nodule. No pleural effusion or pneumothorax. The visualized skeletal structures are unremarkable. IMPRESSION: No new consolidation or suspicious nodule. Findings are stable to prior chest radiograph. Electronically Signed   By: Megan  Zare M.D.   On: 11/02/2023 12:12    Labs:  CBC: Recent Labs    11/10/23 0528 11/11/23 0530 11/12/23 0500 11/14/23 0532  WBC 5.4 5.3 5.4  5.9  HGB 8.7* 8.6* 8.8* 9.0*  HCT 27.7* 27.6* 28.9* 27.9*  PLT 251 211 189 132*    COAGS: Recent Labs    06/14/23 0452  INR 1.7*  APTT 32    BMP: Recent Labs    11/12/23 0500 11/13/23 0533 11/14/23 0532 11/15/23 0444  NA 128* 129* 129* 131*  K 4.9 5.0 4.9 4.9  CL 104 106 104 103  CO2 17* 17* 17* 19*  GLUCOSE 145* 134* 173* 146*  BUN 36* 35* 35* 34*  CALCIUM  8.8* 8.5* 8.7* 8.6*  CREATININE 2.24* 2.05* 1.98* 2.03*  GFRNONAA 32* 36* 37* 36*    LIVER FUNCTION TESTS: Recent  Labs    11/03/23 0824 11/05/23 0350 11/06/23 0433 11/10/23 0528 11/11/23 0530 11/13/23 0533 11/14/23 0532 11/15/23 0444  BILITOT 0.7 0.6 0.7  --   --   --   --  0.6  AST 33 28 34  --   --   --   --  13*  ALT 36 28 33  --   --   --   --  20  ALKPHOS 53 49 46  --   --   --   --  39  PROT 7.6 7.1 6.7  --   --   --   --  7.1  ALBUMIN 2.1* 1.8* 1.7*   < > 1.8* 1.9* 1.9* 2.0*   < > = values in this interval not displayed.    TUMOR MARKERS: No results for input(s): AFPTM, CEA, CA199, CHROMGRNA in the last 8760 hours.  Assessment and Plan:  Pt is with M spike; anemia scheduled for image guided bone marrow biopsy and aspiration 11/16/2023.    Risks and benefits of bone marrow biopsy and aspiration was discussed with the patient and/or patient's family including, but not limited to bleeding, infection, damage to adjacent structures or low yield requiring additional tests.  All of the questions were answered and there is agreement to proceed.  Consent signed and in chart.  Thank you for allowing our service to participate in Derek Thompson 's care.  Electronically Signed: Lavanda JAYSON Jurist, PA-C   11/15/2023, 2:15 PM    I spent a total of 20 Minutes    in face to face in clinical consultation, greater than 50% of which was counseling/coordinating care for bone marrow biopsy and aspiration.

## 2023-11-15 NOTE — Progress Notes (Signed)
 HD#12 SUBJECTIVE:  Patient Summary: Derek Thompson is a 63 y.o. with a pertinent PMH of End-stage HFrEF, CKD, CVA, bladder outlet obstruction s/p coude foley catheter during last admission 10/2023, and chronic osteomyelitis , admitted hypovolemic hyponatremia and AKI with coude foley and on CTX, improving  Overnight Events: No acute events overnight   Interim History:  Pt seen and examined at the bedside this morning. He was feeling nauseous and had vomited after drinking a berry boost this morning. He does seem frustrated this morning after talking with the hematologist yesterday. He reports that he would just like to get through one health problem before another one popping up. Discussed again his goals of care and he would like to continue with full scope.   OBJECTIVE:  Vital Signs: Vitals:   11/14/23 0854 11/14/23 1614 11/14/23 2018 11/15/23 0851  BP: 97/64 92/60 (P) 96/60 104/65  Pulse: 96 90 (P) 98 91  Resp: 19 19 (P) 18 18  Temp: 98.1 F (36.7 C) (!) 97.5 F (36.4 C) (P) 97.6 F (36.4 C) 98.3 F (36.8 C)  TempSrc: Oral Oral (P) Oral   SpO2: 100% 99% (P) 98% 98%  Weight:      Height:       Supplemental O2: Room Air SpO2: 98 %  Filed Weights   11/02/23 1545 11/13/23 0426 11/14/23 0500  Weight: 69.2 kg 64.4 kg 63.3 kg     Intake/Output Summary (Last 24 hours) at 11/15/2023 1522 Last data filed at 11/15/2023 0600 Gross per 24 hour  Intake --  Output 1400 ml  Net -1400 ml   Net IO Since Admission: -10,649.19 mL [11/15/23 1522]  Physical Exam: Const: Awake, alert in NAD HENT: Normocephalic, atraumatic, Card: Tacchycardic, No MRG, No pitting edema on LE's bilaterally  Resp: Decreased at the bases. Extremities: Warm. RLE bandage in place over foot with discoloration of the toes. Decreased skin turgor in bilateral lower extremities  Patient Lines/Drains/Airways Status     Active Line/Drains/Airways     Name Placement date Placement time Site Days    Peripheral IV 11/03/23 22 G 1.75 Left;Proximal Forearm 11/03/23  0945  Forearm  1   Peripheral IV 11/04/23 20 G 1 Posterior;Right Forearm 11/04/23  0845  Forearm  less than 1   Urethral Catheter Mekides Nida, RN Coude 16 Fr. 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, reddened foot area with purulent drainage present 09/01/23  1721  Foot  63   Wound 11/02/23 1655 Vascular Ulcer Foot Anterior;Right 11/02/23  1655  Foot  2   Wound 11/02/23 1658 Vascular Ulcer Foot Right;Lateral 11/02/23  1658  Foot  2            Pertinent labs and imaging:      Latest Ref Rng & Units 11/14/2023    5:32 AM 11/12/2023    5:00 AM 11/11/2023    5:30 AM  CBC  WBC 4.0 - 10.5 K/uL 5.9  5.4  5.3   Hemoglobin 13.0 - 17.0 g/dL 9.0  8.8  8.6   Hematocrit 39.0 - 52.0 % 27.9  28.9  27.6   Platelets 150 - 400 K/uL 132  189  211        Latest Ref Rng & Units 11/15/2023    4:44 AM 11/14/2023    5:32 AM 11/13/2023    5:33 AM  CMP  Glucose 70 -  99 mg/dL 853  826  865   BUN 8 - 23 mg/dL 34  35  35   Creatinine 0.61 - 1.24 mg/dL 7.96  8.01  7.94   Sodium 135 - 145 mmol/L 131  129  129   Potassium 3.5 - 5.1 mmol/L 4.9  4.9  5.0   Chloride 98 - 111 mmol/L 103  104  106   CO2 22 - 32 mmol/L 19  17  17    Calcium  8.9 - 10.3 mg/dL 8.6  8.7  8.5   Total Protein 6.5 - 8.1 g/dL 7.1     Total Bilirubin 0.0 - 1.2 mg/dL 0.6     Alkaline Phos 38 - 126 U/L 39     AST 15 - 41 U/L 13     ALT 0 - 44 U/L 20      No results found.    ASSESSMENT/PLAN:  Assessment: Principal Problem:   Osteomyelitis of right foot (HCC) Active Problems:   CAD (coronary artery disease)   Hyperkalemia   AKI (acute kidney injury) (HCC)   Anemia   LV (left ventricular) mural thrombus without MI (HCC)   Hyponatremia   HFrEF (heart failure with reduced ejection fraction) (HCC)   Chronic osteomyelitis involving lower leg,  right (HCC)   Frailty   Urinary retention   Generalized weakness   Sinus tachycardia   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 (HCC)   Chronic anemia   Hypoalbuminemia   Monoclonal gammopathy   Plan:  Chronic osteomyelitis R foot PAD Pt has long history of persistent RLE osteomyelitis. This hospitalization it has continued to worsen. He was not on medications prior to admission and has not wished to treat it in the past.  -Ortho recommended BKA which is scheduled for 7/18 with Dr. Harden -Continue Doxycycline  and Augmentin  for suppression due to Step anginosis culture and sensitivities last wound culture.  Elevated Protein gap  SPEP with Positive M-Spike SPEP with gamma globulin 2.1 and Mspike 1.1. Kappa/Lambda light chain ratio 0.58. IgG elevated at 2,664 with lamba specificity, UPEP bence jones protein positive.  -This could also be contributing to hyponatremia in form of a pseudohyponatremia.  -Dr. Autumn with hematology consulted and will proceed with bone marrow biopsy with IR>    Hyponatremia Likely multifactorial, including cardiorenal syndrome, AKI, bladder outlet obstruction, and hypovolemia. Pt continues to mildly improve with fluids, but this is not sustained. Could potentially be due to pseudohyponatremia due to presence of monoclonal gammopathy.   Debility -Pt currently on hospital day 13 with decreased mobility and the vast majority of time spent in the bed. Will continue to encourage working with PT/OT and out of bed throughout the day.   Sepsis Possible PNA -Resolved.  -Continue Doxycycline  and augmentin  for osteomyelitis suppression.   AKI Bladder outlet obstruction with BL hydroureter UTI NAGMA Since admission, AKI has significantly improved. Continued increased urine output. Possible post ATN diuresis. Will keep coude catheter in place. Urine culture negative. Will continue to hold fluids today as pts oral intake has been  adequate.  Biventricular HFrEF EF 20-25%.  Persistent Tacchycardia Hx of nonobstructive CAD. Nonadherent to medications. Not a candidate for advanced therapies. Suspicion of prior MI but none documented in the chart. Remains peripherally euvolemic. Pt has not had anymore episodes of tachypnea or orthopnea - Monitor electrolytes; goal Mg 2, K ~4. - Monitor urine output. -Will increase metoprolol  to 25mg  BID. HR improved today.   Hx of LV thrombus (not visualized  on TTE and MR 10/2023) New subscapular splenic infarct Nonadherent to Eliquis  at home. Eliquis  held due to downtrending Hgb. Will continue to hold due to anemia, fluctuating Hgb, and potential BKA this week. -Lovenox  for VTE prophylaxis  Acute on chronic anemia -Low sat ratios during last hospitalization.  -S/p IV Iron  7/8. Will continue daily CBC's. Transfuse if Hbg <7  Encephalopathy Delirium Cefepime  neurotoxicity Resolved.   Multisystem organ failure Goals of care Palliative Consult Severe protein calorie malnutrition -Discussed multisystem organ failure and recurrent hospitalizations -Palliative care saw pt and pt expressed desire to continue to be full code, full scope.  -At this time, he and his family wish to continue aggressive care including BKA.   -Per PT eval, pt to SNF at discharge.   Best Practice: Diet: Renal diet IVF: Fluids: none VTE: Lovenox  Code: Full  Disposition planning: Therapy Recs: Pending, DME: other pending DISPO: Anticipated discharge pending PT eval pending clinical improvement and R BKA.  Signature:  Schuyler Novak, DO [facility]  3:22 PM, 11/15/2023  On Call pager [phone]

## 2023-11-15 NOTE — Plan of Care (Signed)

## 2023-11-15 NOTE — TOC Progression Note (Addendum)
 Transition of Care [facility]) - Progression Note    Patient Details  Name: Derek Thompson MRN: [id number] Date of Birth: 1960/05/05  [facility] CM/SW Contact  Isaiah Public, LCSWA Phone Number: 11/15/2023, 12:52 PM  Clinical Narrative:     CSW awaiting to hear back from [facility] with [facility] to confirm SNF bed for patient. CSW following to start insurance authorization for patient closer to patient being medically ready for dc.  Update- CSW received call back from [place] with [facility] who confirmed SNF bed for patient. CSW following to start insurance authorization for patient.  Expected Discharge Plan: Skilled Nursing Facility Barriers to Discharge: Continued Medical Work up  Expected Discharge Plan and Services In-house Referral: Clinical Social Work     Living arrangements for the past 2 months: Homeless Shelter                                       Social Determinants of Health (SDOH) Interventions SDOH Screenings   Food Insecurity: No Food Insecurity (11/02/2023)  Housing: High Risk (11/02/2023)  Transportation Needs: Unmet Transportation Needs (11/02/2023)  Utilities: Not At Risk (11/02/2023)  Alcohol Screen: Low Risk  (08/19/2022)  Depression (PHQ2-9): Low Risk  (10/11/2023)  Financial Resource Strain: Low Risk  (08/19/2022)  Tobacco Use: Medium Risk (11/02/2023)    Readmission Risk Interventions     No data to display

## 2023-11-15 NOTE — Progress Notes (Signed)
 This chaplain reviewed the Pt. chart and understands the Pt. HCPOA is documented and viewable in [facility].   Chaplain Leeroy Hummer [phone]

## 2023-11-15 NOTE — Progress Notes (Signed)
 Physical Therapy Treatment Patient Details Name: Derek Thompson MRN: [id number] DOB: [birth year] Today's Date: 11/15/2023   History of Present Illness Pt is a 63 y/o M presenting to ED on 11/02/23 with lethargy. Pt found to be tachycardic, hyperkalemic, and hyponatremia. Recent hospital stay in June. 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    PT Comments  Focused session on transfer training in preparation for anticipated BKA later this week. The pt was able to perform a lateral scoot transfer to the L from bed to recliner with minA, intermittently still pushing through his R leg to assist himself even though cued to try to perform NWB to simulate having a BKA. The pt experienced bowel incontinence and then had to perform a stand and step pivot transfer back to bed to perform standing pericare before sitting back down. MinA required for transfers this date. Will continue to follow acutely.     If plan is discharge home, recommend the following: A lot of help with bathing/dressing/bathroom;Supervision due to cognitive status;Help with stairs or ramp for entrance;A lot of help with walking and/or transfers;Assistance with cooking/housework;Assist for transportation   Can travel by private vehicle      (may need WC van or PTAR post-op)  Equipment Recommendations  None recommended by PT (TBD post-op)    Recommendations for Other Services       Precautions / Restrictions Precautions Precautions: Fall;Other (comment) Recall of Precautions/Restrictions: Impaired Precaution/Restrictions Comments: R post op shoe; watch HR Restrictions Weight Bearing Restrictions Per Provider Order: No Other Position/Activity Restrictions: has a post op shoe for R foot     Mobility  Bed Mobility Overal  bed mobility: Needs Assistance Bed Mobility: Supine to Sit, Sit to Supine     Supine to sit: HOB elevated, Used rails, Contact guard Sit to supine: Contact guard assist   General bed mobility comments: Extra time and use of rails, CGA for safety    Transfers Overall transfer level: Needs assistance Equipment used: Rolling walker (2 wheels), None Transfers: Sit to/from Stand, Bed to chair/wheelchair/BSC Sit to Stand: Min assist   Step pivot transfers: Min assist      Lateral/Scoot Transfers: Min assist General transfer comment: Practiced laterally scooting to L from EOB to recliner with arm rest dropped. Cues provided to push through L leg and arms to unweight buttocks to scoot. Good initiation noted, intermittently placing weight through R leg yet cued to try to perform NWB to simulate having a BKA. MinA to complete. Stood from chair to step pivot back to R to bed, allowing access for pericare due to noted bowel incontinence. MinA to power up to stand and maintain balance    Ambulation/Gait Ambulation/Gait assistance: Min assist Gait Distance (Feet): 2 Feet Assistive device: Rolling walker (2 wheels) Gait Pattern/deviations: Decreased stride length, Decreased step length - right, Decreased stance time - right, Step-to pattern, Trunk flexed Gait velocity: reduced Gait velocity interpretation: <1.31 ft/sec, indicative of household ambulator   General Gait Details: Small steps to R from chair to bed with minA for balance. Pt needed cuing to turn and manage his RW   Stairs             Wheelchair Mobility     Tilt Bed    Modified Rankin (Stroke Patients Only)  Balance Overall balance assessment: Needs assistance Sitting-balance support: Feet supported, No upper extremity supported Sitting balance-Leahy Scale: Fair Sitting balance - Comments: No LOB sitting EOB with supervision for safety   Standing balance support: Bilateral upper extremity supported, Reliant  on assistive device for balance, During functional activity Standing balance-Leahy Scale: Poor Standing balance comment: reliant on RW                            Communication Communication Communication: No apparent difficulties  Cognition Arousal: Alert Behavior During Therapy: WFL for tasks assessed/performed   PT - Cognitive impairments: Initiation, Problem solving, Sequencing, Attention                       PT - Cognition Comments: Slow processing and quick to fatigue. Poor awareness of his bowel incontinence. Needs cues to sequence mobility Following commands: Impaired Following commands impaired: Follows one step commands with increased time, Follows multi-step commands with increased time    Cueing Cueing Techniques: Verbal cues, Tactile cues, Gestural cues  Exercises      General Comments        Pertinent Vitals/Pain Pain Assessment Pain Assessment: Faces Faces Pain Scale: Hurts a little bit Pain Location: legs Pain Descriptors / Indicators: Discomfort, Grimacing, Guarding Pain Intervention(s): Monitored during session, Limited activity within patient's tolerance, Repositioned    Home Living                          Prior Function            PT Goals (current goals can now be found in the care plan section) Acute Rehab PT Goals Patient Stated Goal: To be able to move around again after R BKA surgery PT Goal Formulation: With patient Time For Goal Achievement: 11/21/23 Potential to Achieve Goals: Good Progress towards PT goals: Progressing toward goals    Frequency    Min 2X/week      PT Plan      Co-evaluation              AM-PAC PT 6 Clicks Mobility   Outcome Measure  Help needed turning from your back to your side while in a flat bed without using bedrails?: A Little Help needed moving from lying on your back to sitting on the side of a flat bed without using bedrails?: A Little Help needed moving to  and from a bed to a chair (including a wheelchair)?: A Little Help needed standing up from a chair using your arms (e.g., wheelchair or bedside chair)?: A Little Help needed to walk in hospital room?: Total Help needed climbing 3-5 steps with a railing? : Total 6 Click Score: 14    End of Session   Activity Tolerance: Patient limited by fatigue Patient left: in bed;with call bell/phone within reach;with bed alarm set Nurse Communication: Other (comment) (bowel incontinence - NT) PT Visit Diagnosis: Unsteadiness on feet (R26.81);Difficulty in walking, not elsewhere classified (R26.2);Other abnormalities of gait and mobility (R26.89) Pain - Right/Left: Right Pain - part of body: Ankle and joints of foot     Time: 8471-8450 PT Time Calculation (min) (ACUTE ONLY): 21 min  Charges:    $Therapeutic Activity: 8-22 mins PT General Charges $$ ACUTE PT VISIT: 1 Visit                     Theo Ferretti, PT, DPT Acute  Rehabilitation Services  Office: [phone]    Theo CHRISTELLA Ferretti 11/15/2023, 5:17 PM

## 2023-11-16 ENCOUNTER — Inpatient Hospital Stay (HOSPITAL_COMMUNITY): Payer: MEDICAID

## 2023-11-16 ENCOUNTER — Encounter (HOSPITAL_COMMUNITY): Payer: Self-pay

## 2023-11-16 DIAGNOSIS — M86661 Other chronic osteomyelitis, right tibia and fibula: Secondary | ICD-10-CM

## 2023-11-16 DIAGNOSIS — I96 Gangrene, not elsewhere classified: Secondary | ICD-10-CM | POA: Diagnosis not present

## 2023-11-16 DIAGNOSIS — M86271 Subacute osteomyelitis, right ankle and foot: Secondary | ICD-10-CM

## 2023-11-16 HISTORY — PX: IR BONE MARROW BIOPSY & ASPIRATION: IMG5727

## 2023-11-16 LAB — GLUCOSE, CAPILLARY
Glucose-Capillary: 157 mg/dL — ABNORMAL HIGH (ref 70–99)
Glucose-Capillary: 193 mg/dL — ABNORMAL HIGH (ref 70–99)
Glucose-Capillary: 197 mg/dL — ABNORMAL HIGH (ref 70–99)
Glucose-Capillary: 107 mg/dL — ABNORMAL HIGH (ref 70–99)

## 2023-11-16 LAB — RENAL FUNCTION PANEL
Albumin: 1.9 g/dL — ABNORMAL LOW (ref 3.5–5.0)
Anion gap: 9 (ref 5–15)
BUN: 32 mg/dL — ABNORMAL HIGH (ref 8–23)
CO2: 15 mmol/L — ABNORMAL LOW (ref 22–32)
Calcium: 8.4 mg/dL — ABNORMAL LOW (ref 8.9–10.3)
Chloride: 108 mmol/L (ref 98–111)
Creatinine, Ser: 2.1 mg/dL — ABNORMAL HIGH (ref 0.61–1.24)
GFR, Estimated: 35 mL/min — ABNORMAL LOW (ref >60)
Glucose, Bld: 153 mg/dL — ABNORMAL HIGH (ref 70–99)
Phosphorus: 3.7 mg/dL (ref 2.5–4.6)
Potassium: 4.9 mmol/L (ref 3.5–5.1)
Sodium: 132 mmol/L — ABNORMAL LOW (ref 135–145)

## 2023-11-16 LAB — TYPE AND SCREEN
ABO/RH(D): B POS
Antibody Screen: NEGATIVE

## 2023-11-16 LAB — CBC WITH DIFFERENTIAL/PLATELET
Abs Immature Granulocytes: 0.03 K/uL (ref 0.00–0.07)
Basophils Absolute: 0 K/uL (ref 0.0–0.1)
Basophils Relative: 0 %
Eosinophils Absolute: 0 K/uL (ref 0.0–0.5)
Eosinophils Relative: 0 %
HCT: 28.2 % — ABNORMAL LOW (ref 39.0–52.0)
Hemoglobin: 8.7 g/dL — ABNORMAL LOW (ref 13.0–17.0)
Immature Granulocytes: 1 %
Lymphocytes Relative: 16 %
Lymphs Abs: 0.9 K/uL (ref 0.7–4.0)
MCH: 27.2 pg (ref 26.0–34.0)
MCHC: 30.9 g/dL (ref 30.0–36.0)
MCV: 88.1 fL (ref 80.0–100.0)
Monocytes Absolute: 0.6 K/uL (ref 0.1–1.0)
Monocytes Relative: 10 %
Neutro Abs: 4.2 K/uL (ref 1.7–7.7)
Neutrophils Relative %: 73 %
Platelets: 110 K/uL — ABNORMAL LOW (ref 150–400)
RBC: 3.2 MIL/uL — ABNORMAL LOW (ref 4.22–5.81)
RDW: 16.6 % — ABNORMAL HIGH (ref 11.5–15.5)
WBC: 5.7 K/uL (ref 4.0–10.5)
nRBC: 0 % (ref 0.0–0.2)

## 2023-11-16 LAB — BETA 2 MICROGLOBULIN, SERUM: Beta-2 Microglobulin: 7.2 mg/L — ABNORMAL HIGH (ref 0.6–2.4)

## 2023-11-16 MED ORDER — LIDOCAINE-EPINEPHRINE 1 %-1:100000 IJ SOLN
20.0000 mL | Freq: Once | INTRAMUSCULAR | Status: DC
  Filled 2023-11-16: qty 20

## 2023-11-16 MED ORDER — LIDOCAINE HCL 1 % IJ SOLN
20.0000 mL | Freq: Once | INTRAMUSCULAR | Status: AC
  Administered 2023-11-16: 10 mL via INTRADERMAL

## 2023-11-16 MED ORDER — MIDAZOLAM HCL 2 MG/2ML IJ SOLN
INTRAMUSCULAR | Status: AC | PRN
Start: 2023-11-16 — End: 2023-11-16
  Administered 2023-11-16: .5 mg via INTRAVENOUS
  Administered 2023-11-16: 1 mg via INTRAVENOUS

## 2023-11-16 MED ORDER — SODIUM BICARBONATE 650 MG PO TABS
1300.0000 mg | ORAL_TABLET | Freq: Two times a day (BID) | ORAL | Status: DC
  Administered 2023-11-16 – 2023-11-24 (×17): 1300 mg via ORAL
  Filled 2023-11-16 (×17): qty 2

## 2023-11-16 MED ORDER — LIDOCAINE HCL 1 % IJ SOLN
INTRAMUSCULAR | Status: AC
  Filled 2023-11-16: qty 20

## 2023-11-16 MED ORDER — FENTANYL CITRATE (PF) 100 MCG/2ML IJ SOLN
INTRAMUSCULAR | Status: AC | PRN
  Administered 2023-11-16 (×2): 25 ug via INTRAVENOUS

## 2023-11-16 MED ORDER — FENTANYL CITRATE (PF) 100 MCG/2ML IJ SOLN
INTRAMUSCULAR | Status: AC
  Filled 2023-11-16: qty 2

## 2023-11-16 MED ORDER — MIDAZOLAM HCL 2 MG/2ML IJ SOLN
INTRAMUSCULAR | Status: AC
  Filled 2023-11-16: qty 2

## 2023-11-16 NOTE — Plan of Care (Signed)

## 2023-11-16 NOTE — Anesthesia Preprocedure Evaluation (Signed)
 Anesthesia Evaluation  Patient identified by MRN, date of birth, ID band Patient awake    Reviewed: Allergy & Precautions, NPO status , Patient's Chart, lab work & pertinent test results  Airway Mallampati: III  TM Distance: >3 FB Neck ROM: Full    Dental  (+) Dental Advisory Given, Chipped, Poor Dentition   Pulmonary former smoker   Pulmonary exam normal breath sounds clear to auscultation       Cardiovascular hypertension, Pt. on home beta blockers and Pt. on medications + CAD and + Past MI  Normal cardiovascular exam Rhythm:Regular Rate:Normal  TTE 2025 1. Left ventricular ejection fraction, by estimation, is 20 to 25%. The  left ventricle has severely decreased function. The left ventricle  demonstrates global hypokinesis. The left ventricular internal cavity size  was mildly dilated. Left ventricular  diastolic function could not be evaluated.   2. Right ventricular systolic function is mildly reduced. The right  ventricular size is normal.   3. Left atrial size was moderately dilated.   4. The mitral valve is normal in structure. Moderate mitral valve  regurgitation. No evidence of mitral stenosis.   5. The aortic valve is tricuspid. Aortic valve regurgitation is not  visualized. No aortic stenosis is present.   6. The inferior vena cava is dilated in size with <50% respiratory  variability, suggesting right atrial pressure of 15 mmHg.   Cath 2024   Ost LAD to Mid LAD lesion is 35% stenosed.   Ost Cx to Prox Cx lesion is 50% stenosed.   Prox RCA to Mid RCA lesion is 60% stenosed.   Dist RCA lesion is 50% stenosed.   Ramus lesion is 80% stenosed.   1st Diag lesion is 30% stenosed.   Prox Cx to Mid Cx lesion is 70% stenosed.   Ost LM lesion is 25% stenosed.   Dist LAD lesion is 60% stenosed.   The left ventricular ejection fraction is 25-35% by visual estimate.     Neuro/Psych  PSYCHIATRIC DISORDERS   Depression    negative neurological ROS     GI/Hepatic negative GI ROS, Neg liver ROS,,,  Endo/Other  diabetes, Type 2, Oral Hypoglycemic Agents    Renal/GU Renal disease  negative genitourinary   Musculoskeletal negative musculoskeletal ROS (+)    Abdominal   Peds  Hematology  (+) Blood dyscrasia (eliquis )   Anesthesia Other Findings   Reproductive/Obstetrics                              Anesthesia Physical Anesthesia Plan  ASA: 4  Anesthesia Plan: General and Regional   Post-op Pain Management: Regional block* and Tylenol  PO (pre-op)*   Induction: Intravenous  PONV Risk Score and Plan: 2 and Ondansetron , Dexamethasone  and Midazolam   Airway Management Planned: Oral ETT  Additional Equipment: ClearSight  Intra-op Plan:   Post-operative Plan: Extubation in OR  Informed Consent: I have reviewed the patients History and Physical, chart, labs and discussed the procedure including the risks, benefits and alternatives for the proposed anesthesia with the patient or authorized representative who has indicated his/her understanding and acceptance.     Dental advisory given  Plan Discussed with: CRNA  Anesthesia Plan Comments:          Anesthesia Quick Evaluation

## 2023-11-16 NOTE — H&P (View-Only) (Signed)
ORTHOPAEDIC CONSULTATION  REQUESTING PHYSICIAN: Rosan Dayton BROCKS, DO  Chief Complaint: Progressive gangrene right foot  HPI: Trace Deny Chevez is a 63 y.o. male who presents with progressive gangrene right foot patient is status post foot salvage intervention with endovascular revascularization and status post ray amputation of the right foot.  Past Medical History:  Diagnosis Date   Anemia    Diabetes mellitus without complication (HCC) 1987   Family history of breast cancer    Hypertension    Myocardial infarction [facility])    Prostate cancer [facility])    Past Surgical History:  Procedure Laterality Date   ABDOMINAL AORTOGRAM W/LOWER EXTREMITY N/A 06/16/2023   Procedure: ABDOMINAL AORTOGRAM W/LOWER EXTREMITY;  Surgeon: Magda Debby SAILOR, MD;  Location: [facility] INVASIVE CV LAB;  Service: Cardiovascular;  Laterality: N/A;   AMPUTATION Right 06/21/2023   Procedure: RIGHT 2ND RAY AMPUTATION;  Surgeon: Harden Jerona GAILS, MD;  Location: [facility] OR;  Service: Orthopedics;  Laterality: Right;   DENTAL SURGERY     IR BONE MARROW BIOPSY & ASPIRATION  11/16/2023   RADIOACTIVE SEED IMPLANT N/A 02/04/2021   Procedure: RADIOACTIVE SEED IMPLANT/BRACHYTHERAPY IMPLANT;  Surgeon: Selma Donnice SAUNDERS, MD;  Location: [facility] ORS;  Service: Urology;  Laterality: N/A;   RIGHT/LEFT HEART CATH AND CORONARY ANGIOGRAPHY N/A 05/25/2020   Procedure: RIGHT/LEFT HEART CATH AND CORONARY ANGIOGRAPHY;  Surgeon: Swazil[place], Peter M, MD;  Location: [facility] INVASIVE CV LAB;  Service: Cardiovascular;  Laterality: N/A;   RIGHT/LEFT HEART CATH AND CORONARY ANGIOGRAPHY N/A 10/14/2022   Procedure: RIGHT/LEFT HEART CATH AND CORONARY ANGIOGRAPHY;  Surgeon: Cherrie Toribio SAUNDERS, MD;  Location: [facility] INVASIVE CV LAB;  Service: Cardiovascular;  Laterality: N/A;   SPACE OAR INSTILLATION N/A 02/04/2021   Procedure: SPACE OAR INSTILLATION;  Surgeon: Selma Donnice SAUNDERS, MD;  Location: [facility] ORS;  Service: Urology;  Laterality: N/A;   Social History   Socioeconomic History    Marital status: Divorced    Spouse name: Not on file   Number of children: 3   Years of education: Not on file   Highest education level: Associate degree: academic program  Occupational History   Occupation: [employer]  Tobacco Use   Smoking status: Former    Current packs/day: 0.00    Average packs/day: 1 pack/day for 43.0 years (43.0 ttl pk-yrs)    Types: Cigarettes    Start date: 05/22/1974    Quit date: 05/22/2017    Years since quitting: 6.4   Smokeless tobacco: Never   Tobacco comments:    Quit 2019 about 1PPD  Vaping Use   Vaping status: Never Used  Substance and Sexual Activity   Alcohol use: Not Currently    Comment: last drink 2015   Drug use: Not Currently    Comment: none in 7 years   Sexual activity: Not Currently  Other Topics Concern   Not on file  Social History Narrative   3 sons   Social Drivers of Corporate investment banker Strain: Low Risk  (08/19/2022)   Overall Financial Resource Strain (CARDIA)    Difficulty of Paying Living Expenses: Not very hard  Food Insecurity: No Food Insecurity (11/02/2023)   Hunger Vital Sign    Worried About Running Out of Food in the Last Year: Never true    Ran Out of Food in the Last Year: Never true  Transportation Needs: Unmet Transportation Needs (11/02/2023)   PRAPARE - Administrator, Civil Service (Medical): Yes    Lack of Transportation (Non-Medical): Yes  Physical Activity: Not on file  Stress: Not on file  Social Connections: Not on file   Family History  Problem Relation Age of Onset   Breast cancer Mother        dx 30s/40s, twice   Cancer Maternal Grandfather        unknown type, d. >50   - negative except otherwise stated in the family history section No Known Allergies Prior to Admission medications   Medication Sig Start Date End Date Taking? Authorizing Provider  apixaban  (ELIQUIS ) 5 MG TABS tablet Take 1 tablet (5 mg total) by mouth 2 (two) times daily. Patient taking differently: Take  5 mg by mouth daily. 10/16/23 01/30/2024 Yes Bender, Damien, DO  aspirin  EC 81 MG tablet Take 1 tablet (81 mg total) by mouth daily. Swallow whole. 10/16/23  Yes Bender, Damien, DO  atorvastatin  (LIPITOR ) 80 MG tablet Take 1 tablet (80 mg total) by mouth daily. 10/16/23 01/18/2024 Yes Bender, Damien, DO  dapagliflozin  propanediol (FARXIGA ) 10 MG TABS tablet Take 1 tablet (10 mg total) by mouth daily before breakfast. 10/16/23  Yes Bender, Damien, DO  ezetimibe  (ZETIA ) 10 MG tablet Take 1 tablet (10 mg total) by mouth daily. 10/16/23  Yes Kandis Damien, DO  ferrous sulfate  325 (65 FE) MG tablet Take 1 tablet (325 mg total) by mouth daily. 10/16/23  Yes Bender, Damien, DO  furosemide  (LASIX ) 20 MG tablet Take 1 tablet (20 mg total) by mouth daily as needed for fluid or edema. Please take on tablet if your have shortness of breath, leg swelling, gain more than 3 pounds within one day or gain more than 5 pounds within one week. 10/16/23 10/15/24 Yes Bender, Damien, DO  metFORMIN  (GLUCOPHAGE ) 500 MG tablet Take 2 tablets (1,000 mg total) by mouth 2 (two) times daily with a meal. Patient taking differently: Take 1,000 mg by mouth daily. 09/11/23 11/02/23 Yes Tobie Gaines, DO  metoprolol  succinate (TOPROL -XL) 25 MG 24 hr tablet TAKE 1 TABLET BY MOUTH DAILY 10/16/23  Yes Kandis Damien, DO  Multiple Vitamin (MULTIVITAMIN) tablet Take 1 tablet by mouth daily.   Yes [provider]  spironolactone  (ALDACTONE ) 25 MG tablet Take 1 tablet (25 mg total) by mouth daily. 10/16/23 10/15/24 Yes Bender, Damien, DO  tamsulosin  (FLOMAX ) 0.4 MG CAPS capsule Take 1 capsule (0.4 mg total) by mouth daily after supper. 10/16/23  Yes Kandis Damien, DO  amoxicillin -clavulanate (AUGMENTIN ) 875-125 MG tablet Take 1 tablet by mouth every 12 (twelve) hours. Patient not taking: Reported on 11/02/2023 10/16/23   Kandis Damien, DO   IR BONE MARROW BIOPSY & ASPIRATION Result Date: 11/16/2023 INDICATION: M spike protein.  Anemia. EXAM: FLUOROSCOPIC GUIDED  BONE MARROW BIOPSY AND ASPIRATION MEDICATIONS: None FLUOROSCOPY: Radiation Exposure Index and estimated peak skin dose (PSD); Reference air kerma (RAK), 1.0 mGy. ANESTHESIA/SEDATION: Moderate (conscious) sedation was employed during this procedure. A total of Versed  1.5 mg and Fentanyl  50 mcg was administered intravenously. Moderate Sedation Time: 10 minutes. The patient's level of consciousness and vital signs were monitored continuously by radiology nursing throughout the procedure under my direct supervision. COMPLICATIONS: None immediate. PROCEDURE: Informed consent was obtained from the patient following an explanation of the procedure, risks, benefits and alternatives. The patient understands, agrees and consents for the procedure. All questions were addressed. A time out was performed prior to the initiation of the procedure. The patient was positioned prone on the fluoroscopy table and the posterior aspect of the RIGHT iliac crest was marked fluoroscopically. The operative site was  prepped and draped in the usual sterile fashion. Under sterile conditions and local anesthesia, an 11 gauge coaxial bone biopsy needle was advanced into the posterior aspect of the RIGHT iliac marrow space under intermittent fluoroscopic guidance. Multiple fluoroscopic images were saved procedural documentation purposes. Initially, a bone marrow aspiration was performed. Next, a bone marrow biopsy was obtained with the 11 gauge outer bone marrow device. The needle was removed and superficial hemostasis was obtained with manual compression. A dressing was applied. The patient tolerated the procedure well without immediate post procedural complication. IMPRESSION: Successful fluoroscopic-guided RIGHT iliac bone marrow aspiration and core biopsy. Thom Hall, MD [facility] Electronically Signed   By: Thom Hall M.D.   On: 11/16/2023 16:50   - pertinent xrays, CT, MRI  studies were reviewed and independently interpreted  Positive ROS: All other systems have been reviewed and were otherwise negative with the exception of those mentioned in the HPI and as above.  Physical Exam: General: Alert, no acute distress Psychiatric: Patient is competent for consent with normal mood and affect Lymphatic: No axillary or cervical lymphadenopathy Cardiovascular: No pedal edema Respiratory: No cyanosis, no use of accessory musculature GI: No organomegaly, abdomen is soft and non-tender    Images:  @ENCIMAGES @  Labs:  Lab Results  Component Value Date   HGBA1C 8.7 (H) 09/02/2023   HGBA1C 13.3 (H) 06/13/2023   HGBA1C 11.4 (A) 11/22/2022   ESRSEDRATE 121 (H) 10/12/2023   ESRSEDRATE 93 (H) 09/02/2023   ESRSEDRATE 103 (H) 09/01/2023   CRP 12.7 (H) 10/12/2023   CRP 2.7 (H) 09/02/2023   CRP 1.5 (H) 09/01/2023   LABURIC 4.8 11/15/2023   REPTSTATUS 11/08/2023 FINAL 11/03/2023   GRAMSTAIN  06/21/2023    RARE WBC PRESENT, PREDOMINANTLY PMN RARE GRAM POSITIVE COCCI IN PAIRS    CULT  11/03/2023    NO GROWTH 5 DAYS Performed at [facility], [address], [place], [name] [address]    [facility] STAPHYLOCOCCUS AUREUS 06/21/2023   LABORGA STREPTOCOCCUS ANGINOSIS 06/21/2023    Lab Results  Component Value Date   ALBUMIN 1.9 (L) 11/16/2023   ALBUMIN 2.0 (L) 11/15/2023   ALBUMIN 1.9 (L) 11/14/2023   LABURIC 4.8 11/15/2023        Latest Ref Rng & Units 11/16/2023    8:52 AM 11/14/2023    5:32 AM 11/12/2023    5:00 AM  CBC EXTENDED  WBC 4.0 - 10.5 K/uL 5.7  5.9  5.4   RBC 4.22 - 5.81 MIL/uL 3.20  3.25  3.28   Hemoglobin 13.0 - 17.0 g/dL 8.7  9.0  8.8   HCT 60.9 - 52.0 % 28.2  27.9  28.9   Platelets 150 - 400 K/uL 110  132  189   NEUT# 1.7 - 7.7 K/uL 4.2     Lymph# 0.7 - 4.0 K/uL 0.9       Neurologic: Patient does not have protective sensation bilateral lower extremities.   MUSCULOSKELETAL:   Skin: Examination patient has progressive  gangrenous changes over the medial and lateral aspect of the right foot there is no ascending cellulitis.  White cell count 5.7 with hemoglobin of 8.7.  Albumin 1.9.  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  Plan: Plan: Discussed treatment options with further debridement of the right foot and tissue grafting versus transtibial amputation.  Patient states he does not want to continue returning to the hospital for further foot  salvage intervention options.  He states he would like to proceed with transtibial amputation.  Anticipate patient would be a good candidate for inpatient rehab.  Thank you for the consult and the opportunity to see Mr. Acea Yagi, MD [facility] [phone] 6:29 PM

## 2023-11-16 NOTE — Progress Notes (Signed)
 Patient ID: Derek Thompson, male   DOB: [birth year], 63 y.o.   MRN: [id number] Patient is seen in follow-up for gangrenous changes right foot.  There is dry gangrene over the medial and lateral aspect of the right foot.  Discussed further foot salvage intervention options with debridement tissue grafting versus a transtibial amputation.  Patient states that he would like to proceed with the transtibial amputation.  Plan for surgery tomorrow.

## 2023-11-16 NOTE — Progress Notes (Signed)
 HD#13 SUBJECTIVE:  Patient Summary: Derek Thompson is a 62 y.o. with a pertinent PMH of End-stage HFrEF, CKD, CVA, bladder outlet obstruction s/p coude foley catheter during last admission 10/2023, and chronic osteomyelitis , admitted hypovolemic hyponatremia and AKI with coude foley and on CTX, improving  Overnight Events: No acute events overnight   Interim History: Pt seen and examined at the bedside this morning. He was feeling pretty good overall after his IR procedure this morning. He reports that he is excited to eat breakfast. He has no questions at this time and is ready for his surgery tomorrow.      OBJECTIVE:  Vital Signs: Vitals:   11/15/23 0851 11/15/23 1605 11/15/23 2101 11/16/23 0506  BP: 104/65 103/68 114/74 102/63  Pulse: 91 94 (!) 107 96  Resp: 18 18 18 18   Temp: 98.3 F (36.8 C) 98 F (36.7 C) 98.5 F (36.9 C) 98.2 F (36.8 C)  TempSrc:      SpO2: 98% 100% 100% 100%  Weight:      Height:       Supplemental O2: Room Air SpO2: 100 %  Filed Weights   11/02/23 1545 11/13/23 0426 11/14/23 0500  Weight: 69.2 kg 64.4 kg 63.3 kg     Intake/Output Summary (Last 24 hours) at 11/16/2023 0739 Last data filed at 11/16/2023 0300 Gross per 24 hour  Intake --  Output 200 ml  Net -200 ml   Net IO Since Admission: -10,849.19 mL [11/16/23 0739]  Physical Exam: Const: Awake, alert in NAD HENT: Normocephalic, atraumatic, Card: RRR, No MRG, No pitting edema on LE's bilaterally  Resp: Decreased at the bases. Extremities: Warm. RLE bandage in place over foot with discoloration of the toes.  Patient Lines/Drains/Airways Status     Active Line/Drains/Airways     Name Placement date Placement time Site Days   Peripheral IV 11/03/23 22 G 1.75 Left;Proximal Forearm 11/03/23  0945  Forearm  1   Peripheral IV 11/04/23 20 G 1 Posterior;Right Forearm 11/04/23  0845  Forearm  less than 1   Urethral Catheter Mekides Nida, RN Coude 16 Fr. 11/03/23  1034  Coude  1    Wound / Incision (Open or Dehisced) 09/01/23 Non-pressure wound;Amputation Foot Anterior;Right amputation of right big toe in february, reddened foot area with purulent drainage present 09/01/23  1721  Foot  64   Wound 11/02/23 1655 Vascular Ulcer Foot Anterior;Right 11/02/23  1655  Foot  2   Wound 11/02/23 1658 Vascular Ulcer Foot Right;Lateral 11/02/23  1658  Foot  2            Pertinent labs and imaging:      Latest Ref Rng & Units 11/14/2023    5:32 AM 11/12/2023    5:00 AM 11/11/2023    5:30 AM  CBC  WBC 4.0 - 10.5 K/uL 5.9  5.4  5.3   Hemoglobin 13.0 - 17.0 g/dL 9.0  8.8  8.6   Hematocrit 39.0 - 52.0 % 27.9  28.9  27.6   Platelets 150 - 400 K/uL 132  189  211        Latest Ref Rng & Units 11/15/2023    4:44 AM 11/14/2023    5:32 AM 11/13/2023    5:33 AM  CMP  Glucose 70 - 99 mg/dL 853  826  865   BUN 8 - 23 mg/dL 34  35  35   Creatinine 0.61 - 1.24 mg/dL 7.96  8.01  7.94   Sodium 135 -  145 mmol/L 131  129  129   Potassium 3.5 - 5.1 mmol/L 4.9  4.9  5.0   Chloride 98 - 111 mmol/L 103  104  106   CO2 22 - 32 mmol/L 19  17  17    Calcium  8.9 - 10.3 mg/dL 8.6  8.7  8.5   Total Protein 6.5 - 8.1 g/dL 7.1     Total Bilirubin 0.0 - 1.2 mg/dL 0.6     Alkaline Phos 38 - 126 U/L 39     AST 15 - 41 U/L 13     ALT 0 - 44 U/L 20      No results found.    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 This hospitalization it has continued to worsen. He was not on medications prior to admission and has not wished to treat it in the past.  -Ortho recommended BKA which is scheduled for 7/18 with Dr. Harden -Continue Doxycycline  and Augmentin  for suppression due to Step anginosis culture and sensitivities last wound culture.  Elevated Protein gap  SPEP with Positive M-Spike SPEP with gamma globulin 2.1 and Mspike 1.1. Kappa/Lambda light chain ratio 0.58. IgG elevated at 2,664 with lamba specificity, UPEP bence jones protein positive.  -This could also be contributing to hyponatremia in form of a pseudohyponatremia.  -Dr. Autumn with hematology consulted and following -Bone marrow biopsy this morning.   Hyponatremia Likely multifactorial, including cardiorenal syndrome, AKI, bladder outlet obstruction, and hypovolemia. Pt continues to mildly improve with fluids, but this is not sustained. Could potentially be due to pseudohyponatremia due to presence of monoclonal gammopathy.   Debility -Pt currently on hospital day 14 with decreased mobility and the vast majority of time spent in the bed. Will continue to encourage working with PT/OT and out of bed throughout the day.  -BKA tomorrow. Will require PT tomorrow with assistance in transfers.   Sepsis Possible PNA -Resolved.  -Continue Doxycycline  and augmentin  for osteomyelitis suppression.   AKI Bladder outlet obstruction with BL hydroureter UTI NAGMA Since admission, AKI has significantly improved. Continued increased urine output. Possible post ATN diuresis. Will keep coude catheter in place. Urine culture negative.  -Renal function likely impacted by monoclonal gammopathy of renal significance. Hematology following -Will reach out to urology today for guidance managing catheter after discharge.  -Will initiate Sodium Bicarb 1300 BID for sustained NAGMA since admission.    Biventricular HFrEF EF 20-25%.  Persistent Tacchycardia Hx of nonobstructive CAD. Nonadherent to medications. Not a candidate for advanced therapies. Suspicion of prior MI but none documented in the chart. Remains peripherally euvolemic. Pt has not had anymore episodes of tachypnea or orthopnea - Monitor electrolytes; goal Mg 2, K ~4. - Monitor urine output. - Continue metoprolol  25mg  BID  Hx of LV thrombus (not visualized on TTE and MR 10/2023) New subscapular splenic  infarct Nonadherent to Eliquis  at home. Eliquis  held due to downtrending Hgb. Will continue to hold due to anemia, fluctuating Hgb, and potential BKA this week. -Lovenox  on hold due to procedures today and tomorrow.   Acute on chronic anemia -Low sat ratios during last hospitalization.  -S/p IV Iron  7/8. Will continue daily CBC's. Transfuse if Hbg <7  Encephalopathy Delirium Cefepime  neurotoxicity Resolved.   Multisystem organ failure Goals of care Palliative Consult Severe protein calorie malnutrition -Discussed multisystem organ failure and recurrent hospitalizations -Palliative care saw pt and pt expressed desire to continue to be full code, full scope.  -At this time, he and his family wish to continue aggressive care including BKA.   -Per PT eval, Pt to d/c to SNF  Best Practice: Diet: Renal diet IVF: Fluids: none VTE: Lovenox  Code: Full  Disposition planning: Therapy Recs: Pending, DME: other pending DISPO: Anticipated discharge pending PT eval pending clinical improvement and R BKA.  Signature:  Schuyler Novak, DO [facility]  7:39 AM, 11/16/2023  On Call pager [phone]

## 2023-11-16 NOTE — Procedures (Signed)
 Vascular and Interventional Radiology Procedure Note  Patient: Derek Thompson DOB: 1960-06-17 Medical Record Number: [id number] Note Date/Time: 11/16/23 8:10 AM   Performing Physician: Thom Hall, MD Assistant(s): None  Diagnosis: M spike. Anemia   Procedure: BONE MARROW ASPIRATION and BIOPSY  Anesthesia: Conscious Sedation Complications: None Estimated Blood Loss: Minimal Specimens: Sent for Pathology  Findings:  Successful Fluoroscopy-guided bone marrow aspiration and biopsy A total of 1 cores were obtained. Hemostasis of the tract was achieved using Manual Pressure.  Plan: Bed rest for 1 hours.  See detailed procedure note with images in PACS. The patient tolerated the procedure well without incident or complication and was returned to Recovery in stable condition.    Thom Hall, MD [facility] Radiology   Pager. [phone] Clinic. [phone]

## 2023-11-16 NOTE — Consult Note (Signed)
ORTHOPAEDIC CONSULTATION  REQUESTING PHYSICIAN: Derek Dayton BROCKS, DO  Chief Complaint: Progressive gangrene right foot  HPI: Derek Thompson is a 63 y.o. male who presents with progressive gangrene right foot patient is status post foot salvage intervention with endovascular revascularization and status post ray amputation of the right foot.  Past Medical History:  Diagnosis Date   Anemia    Diabetes mellitus without complication (HCC) 1987   Family history of breast cancer    Hypertension    Myocardial infarction [facility])    Prostate cancer [facility])    Past Surgical History:  Procedure Laterality Date   ABDOMINAL AORTOGRAM W/LOWER EXTREMITY N/A 06/16/2023   Procedure: ABDOMINAL AORTOGRAM W/LOWER EXTREMITY;  Surgeon: Magda Debby SAILOR, MD;  Location: [facility] INVASIVE CV LAB;  Service: Cardiovascular;  Laterality: N/A;   AMPUTATION Right 06/21/2023   Procedure: RIGHT 2ND RAY AMPUTATION;  Surgeon: Harden Jerona GAILS, MD;  Location: [facility] OR;  Service: Orthopedics;  Laterality: Right;   DENTAL SURGERY     IR BONE MARROW BIOPSY & ASPIRATION  11/16/2023   RADIOACTIVE SEED IMPLANT N/A 02/04/2021   Procedure: RADIOACTIVE SEED IMPLANT/BRACHYTHERAPY IMPLANT;  Surgeon: Selma Donnice SAUNDERS, MD;  Location: [facility] ORS;  Service: Urology;  Laterality: N/A;   RIGHT/LEFT HEART CATH AND CORONARY ANGIOGRAPHY N/A 05/25/2020   Procedure: RIGHT/LEFT HEART CATH AND CORONARY ANGIOGRAPHY;  Surgeon: [place], Peter M, MD;  Location: [facility] INVASIVE CV LAB;  Service: Cardiovascular;  Laterality: N/A;   RIGHT/LEFT HEART CATH AND CORONARY ANGIOGRAPHY N/A 10/14/2022   Procedure: RIGHT/LEFT HEART CATH AND CORONARY ANGIOGRAPHY;  Surgeon: Cherrie Toribio SAUNDERS, MD;  Location: [facility] INVASIVE CV LAB;  Service: Cardiovascular;  Laterality: N/A;   SPACE OAR INSTILLATION N/A 02/04/2021   Procedure: SPACE OAR INSTILLATION;  Surgeon: Selma Donnice SAUNDERS, MD;  Location: [facility] ORS;  Service: Urology;  Laterality: N/A;   Social History   Socioeconomic History    Marital status: Divorced    Spouse name: Not on file   Number of children: 3   Years of education: Not on file   Highest education level: Associate degree: academic program  Occupational History   Occupation: [employer]  Tobacco Use   Smoking status: Former    Current packs/day: 0.00    Average packs/day: 1 pack/day for 43.0 years (43.0 ttl pk-yrs)    Types: Cigarettes    Start date: 05/22/1974    Quit date: 05/22/2017    Years since quitting: 6.4   Smokeless tobacco: Never   Tobacco comments:    Quit 2019 about 1PPD  Vaping Use   Vaping status: Never Used  Substance and Sexual Activity   Alcohol use: Not Currently    Comment: last drink 2015   Drug use: Not Currently    Comment: none in 7 years   Sexual activity: Not Currently  Other Topics Concern   Not on file  Social History Narrative   3 sons   Social Drivers of Corporate investment banker Strain: Low Risk  (08/19/2022)   Overall Financial Resource Strain (CARDIA)    Difficulty of Paying Living Expenses: Not very hard  Food Insecurity: No Food Insecurity (11/02/2023)   Hunger Vital Sign    Worried About Running Out of Food in the Last Year: Never true    Ran Out of Food in the Last Year: Never true  Transportation Needs: Unmet Transportation Needs (11/02/2023)   PRAPARE - Administrator, Civil Service (Medical): Yes    Lack of Transportation (Non-Medical): Yes  Physical Activity: Not on file  Stress: Not on file  Social Connections: Not on file   Family History  Problem Relation Age of Onset   Breast cancer Mother        dx 30s/40s, twice   Cancer Maternal Grandfather        unknown type, d. >50   - negative except otherwise stated in the family history section No Known Allergies Prior to Admission medications   Medication Sig Start Date End Date Taking? Authorizing Provider  apixaban  (ELIQUIS ) 5 MG TABS tablet Take 1 tablet (5 mg total) by mouth 2 (two) times daily. Patient taking differently: Take  5 mg by mouth daily. 10/16/23 01/05/2024 Yes Bender, Damien, DO  aspirin  EC 81 MG tablet Take 1 tablet (81 mg total) by mouth daily. Swallow whole. 10/16/23  Yes Bender, Damien, DO  atorvastatin  (LIPITOR ) 80 MG tablet Take 1 tablet (80 mg total) by mouth daily. 10/16/23 01/01/2024 Yes Bender, Damien, DO  dapagliflozin  propanediol (FARXIGA ) 10 MG TABS tablet Take 1 tablet (10 mg total) by mouth daily before breakfast. 10/16/23  Yes Bender, Damien, DO  ezetimibe  (ZETIA ) 10 MG tablet Take 1 tablet (10 mg total) by mouth daily. 10/16/23  Yes Kandis Damien, DO  ferrous sulfate  325 (65 FE) MG tablet Take 1 tablet (325 mg total) by mouth daily. 10/16/23  Yes Bender, Damien, DO  furosemide  (LASIX ) 20 MG tablet Take 1 tablet (20 mg total) by mouth daily as needed for fluid or edema. Please take on tablet if your have shortness of breath, leg swelling, gain more than 3 pounds within one day or gain more than 5 pounds within one week. 10/16/23 10/15/24 Yes Bender, Damien, DO  metFORMIN  (GLUCOPHAGE ) 500 MG tablet Take 2 tablets (1,000 mg total) by mouth 2 (two) times daily with a meal. Patient taking differently: Take 1,000 mg by mouth daily. 09/11/23 11/02/23 Yes Tobie Gaines, DO  metoprolol  succinate (TOPROL -XL) 25 MG 24 hr tablet TAKE 1 TABLET BY MOUTH DAILY 10/16/23  Yes Kandis Damien, DO  Multiple Vitamin (MULTIVITAMIN) tablet Take 1 tablet by mouth daily.   Yes [provider]  spironolactone  (ALDACTONE ) 25 MG tablet Take 1 tablet (25 mg total) by mouth daily. 10/16/23 10/15/24 Yes Bender, Damien, DO  tamsulosin  (FLOMAX ) 0.4 MG CAPS capsule Take 1 capsule (0.4 mg total) by mouth daily after supper. 10/16/23  Yes Kandis Damien, DO  amoxicillin -clavulanate (AUGMENTIN ) 875-125 MG tablet Take 1 tablet by mouth every 12 (twelve) hours. Patient not taking: Reported on 11/02/2023 10/16/23   Kandis Damien, DO   IR BONE MARROW BIOPSY & ASPIRATION Result Date: 11/16/2023 INDICATION: M spike protein.  Anemia. EXAM: FLUOROSCOPIC GUIDED  BONE MARROW BIOPSY AND ASPIRATION MEDICATIONS: None FLUOROSCOPY: Radiation Exposure Index and estimated peak skin dose (PSD); Reference air kerma (RAK), 1.0 mGy. ANESTHESIA/SEDATION: Moderate (conscious) sedation was employed during this procedure. A total of Versed  1.5 mg and Fentanyl  50 mcg was administered intravenously. Moderate Sedation Time: 10 minutes. The patient's level of consciousness and vital signs were monitored continuously by radiology nursing throughout the procedure under my direct supervision. COMPLICATIONS: None immediate. PROCEDURE: Informed consent was obtained from the patient following an explanation of the procedure, risks, benefits and alternatives. The patient understands, agrees and consents for the procedure. All questions were addressed. A time out was performed prior to the initiation of the procedure. The patient was positioned prone on the fluoroscopy table and the posterior aspect of the RIGHT iliac crest was marked fluoroscopically. The operative site was  prepped and draped in the usual sterile fashion. Under sterile conditions and local anesthesia, an 11 gauge coaxial bone biopsy needle was advanced into the posterior aspect of the RIGHT iliac marrow space under intermittent fluoroscopic guidance. Multiple fluoroscopic images were saved procedural documentation purposes. Initially, a bone marrow aspiration was performed. Next, a bone marrow biopsy was obtained with the 11 gauge outer bone marrow device. The needle was removed and superficial hemostasis was obtained with manual compression. A dressing was applied. The patient tolerated the procedure well without immediate post procedural complication. IMPRESSION: Successful fluoroscopic-guided RIGHT iliac bone marrow aspiration and core biopsy. Thom Hall, MD [facility] Electronically Signed   By: Thom Hall M.D.   On: 11/16/2023 16:50   - pertinent xrays, CT, MRI  studies were reviewed and independently interpreted  Positive ROS: All other systems have been reviewed and were otherwise negative with the exception of those mentioned in the HPI and as above.  Physical Exam: General: Alert, no acute distress Psychiatric: Patient is competent for consent with normal mood and affect Lymphatic: No axillary or cervical lymphadenopathy Cardiovascular: No pedal edema Respiratory: No cyanosis, no use of accessory musculature GI: No organomegaly, abdomen is soft and non-tender    Images:  @ENCIMAGES @  Labs:  Lab Results  Component Value Date   HGBA1C 8.7 (H) 09/02/2023   HGBA1C 13.3 (H) 06/13/2023   HGBA1C 11.4 (A) 11/22/2022   ESRSEDRATE 121 (H) 10/12/2023   ESRSEDRATE 93 (H) 09/02/2023   ESRSEDRATE 103 (H) 09/01/2023   CRP 12.7 (H) 10/12/2023   CRP 2.7 (H) 09/02/2023   CRP 1.5 (H) 09/01/2023   LABURIC 4.8 11/15/2023   REPTSTATUS 11/08/2023 FINAL 11/03/2023   GRAMSTAIN  06/21/2023    RARE WBC PRESENT, PREDOMINANTLY PMN RARE GRAM POSITIVE COCCI IN PAIRS    CULT  11/03/2023    NO GROWTH 5 DAYS Performed at [facility], [address], [place], [name] [address]    [facility] STAPHYLOCOCCUS AUREUS 06/21/2023   LABORGA STREPTOCOCCUS ANGINOSIS 06/21/2023    Lab Results  Component Value Date   ALBUMIN 1.9 (L) 11/16/2023   ALBUMIN 2.0 (L) 11/15/2023   ALBUMIN 1.9 (L) 11/14/2023   LABURIC 4.8 11/15/2023        Latest Ref Rng & Units 11/16/2023    8:52 AM 11/14/2023    5:32 AM 11/12/2023    5:00 AM  CBC EXTENDED  WBC 4.0 - 10.5 K/uL 5.7  5.9  5.4   RBC 4.22 - 5.81 MIL/uL 3.20  3.25  3.28   Hemoglobin 13.0 - 17.0 g/dL 8.7  9.0  8.8   HCT 60.9 - 52.0 % 28.2  27.9  28.9   Platelets 150 - 400 K/uL 110  132  189   NEUT# 1.7 - 7.7 K/uL 4.2     Lymph# 0.7 - 4.0 K/uL 0.9       Neurologic: Patient does not have protective sensation bilateral lower extremities.   MUSCULOSKELETAL:   Skin: Examination patient has progressive  gangrenous changes over the medial and lateral aspect of the right foot there is no ascending cellulitis.  White cell count 5.7 with hemoglobin of 8.7.  Albumin 1.9.  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  Plan: Plan: Discussed treatment options with further debridement of the right foot and tissue grafting versus transtibial amputation.  Patient states he does not want to continue returning to the hospital for further foot  salvage intervention options.  He states he would like to proceed with transtibial amputation.  Anticipate patient would be a good candidate for inpatient rehab.  Thank you for the consult and the opportunity to see Mr. Acea Yagi, MD [facility] [phone] 6:29 PM

## 2023-11-17 ENCOUNTER — Other Ambulatory Visit: Payer: Self-pay

## 2023-11-17 ENCOUNTER — Inpatient Hospital Stay (HOSPITAL_COMMUNITY): Payer: MEDICAID | Admitting: Anesthesiology

## 2023-11-17 ENCOUNTER — Encounter (HOSPITAL_COMMUNITY)
Admission: EM | Disposition: A | Discharge status: Hospice | Payer: Self-pay | Source: Home / Self Care | Attending: Internal Medicine

## 2023-11-17 ENCOUNTER — Encounter (HOSPITAL_COMMUNITY): Payer: Self-pay | Admitting: Internal Medicine

## 2023-11-17 DIAGNOSIS — I96 Gangrene, not elsewhere classified: Secondary | ICD-10-CM | POA: Diagnosis not present

## 2023-11-17 DIAGNOSIS — M86271 Subacute osteomyelitis, right ankle and foot: Secondary | ICD-10-CM | POA: Diagnosis not present

## 2023-11-17 DIAGNOSIS — M86661 Other chronic osteomyelitis, right tibia and fibula: Secondary | ICD-10-CM | POA: Diagnosis not present

## 2023-11-17 HISTORY — PX: AMPUTATION: SHX166

## 2023-11-17 HISTORY — PX: APPLICATION OF WOUND VAC: SHX5189

## 2023-11-17 LAB — GLUCOSE, CAPILLARY
Glucose-Capillary: 113 mg/dL — ABNORMAL HIGH (ref 70–99)
Glucose-Capillary: 170 mg/dL — ABNORMAL HIGH (ref 70–99)
Glucose-Capillary: 178 mg/dL — ABNORMAL HIGH (ref 70–99)
Glucose-Capillary: 191 mg/dL — ABNORMAL HIGH (ref 70–99)
Glucose-Capillary: 498 mg/dL — ABNORMAL HIGH (ref 70–99)
Glucose-Capillary: 536 mg/dL (ref 70–99)
Glucose-Capillary: 582 mg/dL (ref 70–99)
Glucose-Capillary: 591 mg/dL (ref 70–99)
Glucose-Capillary: 558 mg/dL (ref 70–99)
Glucose-Capillary: 463 mg/dL — ABNORMAL HIGH (ref 70–99)

## 2023-11-17 LAB — COMPREHENSIVE METABOLIC PANEL WITH GFR
ALT: 21 U/L (ref 0–44)
AST: 16 U/L (ref 15–41)
Albumin: 2 g/dL — ABNORMAL LOW (ref 3.5–5.0)
Alkaline Phosphatase: 37 U/L — ABNORMAL LOW (ref 38–126)
Anion gap: 7 (ref 5–15)
BUN: 36 mg/dL — ABNORMAL HIGH (ref 8–23)
CO2: 19 mmol/L — ABNORMAL LOW (ref 22–32)
Calcium: 8.6 mg/dL — ABNORMAL LOW (ref 8.9–10.3)
Chloride: 105 mmol/L (ref 98–111)
Creatinine, Ser: 2.16 mg/dL — ABNORMAL HIGH (ref 0.61–1.24)
GFR, Estimated: 33 mL/min — ABNORMAL LOW (ref >60)
Glucose, Bld: 127 mg/dL — ABNORMAL HIGH (ref 70–99)
Potassium: 5.1 mmol/L (ref 3.5–5.1)
Sodium: 131 mmol/L — ABNORMAL LOW (ref 135–145)
Total Bilirubin: 0.7 mg/dL (ref 0.0–1.2)
Total Protein: 7.1 g/dL (ref 6.5–8.1)

## 2023-11-17 LAB — BASIC METABOLIC PANEL WITH GFR
Anion gap: 7 (ref 5–15)
BUN: 46 mg/dL — ABNORMAL HIGH (ref 8–23)
CO2: 20 mmol/L — ABNORMAL LOW (ref 22–32)
Calcium: 8.1 mg/dL — ABNORMAL LOW (ref 8.9–10.3)
Chloride: 101 mmol/L (ref 98–111)
Creatinine, Ser: 2.43 mg/dL — ABNORMAL HIGH (ref 0.61–1.24)
GFR, Estimated: 29 mL/min — ABNORMAL LOW (ref >60)
Glucose, Bld: 535 mg/dL (ref 70–99)
Potassium: 5.4 mmol/L — ABNORMAL HIGH (ref 3.5–5.1)
Sodium: 128 mmol/L — ABNORMAL LOW (ref 135–145)

## 2023-11-17 SURGERY — AMPUTATION BELOW KNEE
Anesthesia: Regional | Site: Knee | Laterality: Right

## 2023-11-17 MED ORDER — INSULIN ASPART 100 UNIT/ML IJ SOLN: 0.0000 [IU] | INTRAMUSCULAR | Status: DC | PRN

## 2023-11-17 MED ORDER — FENTANYL CITRATE (PF) 100 MCG/2ML IJ SOLN: 25.0000 ug | INTRAMUSCULAR | Status: DC | PRN

## 2023-11-17 MED ORDER — SODIUM ZIRCONIUM CYCLOSILICATE 10 G PO PACK
10.0000 g | PACK | Freq: Once | ORAL | Status: AC
  Administered 2023-11-17: 10 g via ORAL
  Filled 2023-11-17: qty 1

## 2023-11-17 MED ORDER — MIDAZOLAM HCL 2 MG/2ML IJ SOLN
INTRAMUSCULAR | Status: AC
  Administered 2023-11-17: 1 mg via INTRAVENOUS
  Filled 2023-11-17: qty 2

## 2023-11-17 MED ORDER — ACETAMINOPHEN 500 MG PO TABS
ORAL_TABLET | ORAL | Status: AC
  Administered 2023-11-17: 1000 mg via ORAL
  Filled 2023-11-17: qty 1

## 2023-11-17 MED ORDER — FENTANYL CITRATE (PF) 250 MCG/5ML IJ SOLN
INTRAMUSCULAR | Status: AC
  Filled 2023-11-17: qty 5

## 2023-11-17 MED ORDER — CHLORHEXIDINE GLUCONATE 0.12 % MT SOLN: 15.0000 mL | Freq: Once | OROMUCOSAL | Status: AC

## 2023-11-17 MED ORDER — MIDAZOLAM HCL 2 MG/2ML IJ SOLN: 1.0000 mg | Freq: Once | INTRAMUSCULAR | Status: AC

## 2023-11-17 MED ORDER — SUGAMMADEX SODIUM 200 MG/2ML IV SOLN
INTRAVENOUS | Status: AC
  Filled 2023-11-17: qty 2

## 2023-11-17 MED ORDER — CEFAZOLIN SODIUM-DEXTROSE 2-4 GM/100ML-% IV SOLN
2.0000 g | INTRAVENOUS | Status: AC
  Administered 2023-11-17: 2 g via INTRAVENOUS

## 2023-11-17 MED ORDER — HYDROMORPHONE HCL 2 MG PO TABS
1.0000 mg | ORAL_TABLET | Freq: Three times a day (TID) | ORAL | Status: DC | PRN
  Administered 2023-11-19 – 2023-12-22 (×23): 1 mg via ORAL
  Filled 2023-11-17 (×25): qty 1

## 2023-11-17 MED ORDER — ACETAMINOPHEN 500 MG PO TABS: 1000.0000 mg | ORAL_TABLET | Freq: Once | ORAL | Status: AC

## 2023-11-17 MED ORDER — FENTANYL CITRATE (PF) 100 MCG/2ML IJ SOLN: 50.0000 ug | Freq: Once | INTRAMUSCULAR | Status: AC

## 2023-11-17 MED ORDER — LIDOCAINE 2% (20 MG/ML) 5 ML SYRINGE
INTRAMUSCULAR | Status: DC | PRN
  Administered 2023-11-17: 40 mg via INTRAVENOUS

## 2023-11-17 MED ORDER — ONDANSETRON HCL 4 MG/2ML IJ SOLN
INTRAMUSCULAR | Status: DC | PRN
  Administered 2023-11-17: 4 mg via INTRAVENOUS

## 2023-11-17 MED ORDER — 0.9 % SODIUM CHLORIDE (POUR BTL) OPTIME
TOPICAL | Status: DC | PRN
  Administered 2023-11-17: 1000 mL

## 2023-11-17 MED ORDER — INSULIN ASPART 100 UNIT/ML IJ SOLN
15.0000 [IU] | Freq: Once | INTRAMUSCULAR | Status: AC
  Administered 2023-11-17: 15 [IU] via SUBCUTANEOUS

## 2023-11-17 MED ORDER — OXYCODONE HCL 5 MG/5ML PO SOLN: 5.0000 mg | Freq: Once | ORAL | Status: DC | PRN

## 2023-11-17 MED ORDER — EPHEDRINE SULFATE-NACL 50-0.9 MG/10ML-% IV SOSY
PREFILLED_SYRINGE | INTRAVENOUS | Status: DC | PRN
  Administered 2023-11-17: 5 mg via INTRAVENOUS

## 2023-11-17 MED ORDER — OXYCODONE HCL 5 MG PO TABS: 5.0000 mg | ORAL_TABLET | Freq: Once | ORAL | Status: DC | PRN

## 2023-11-17 MED ORDER — AMISULPRIDE (ANTIEMETIC) 5 MG/2ML IV SOLN: 10.0000 mg | Freq: Once | INTRAVENOUS | Status: DC | PRN

## 2023-11-17 MED ORDER — ONDANSETRON HCL 4 MG/2ML IJ SOLN
INTRAMUSCULAR | Status: AC
  Filled 2023-11-17: qty 2

## 2023-11-17 MED ORDER — SODIUM CHLORIDE 0.9 % IV SOLN: INTRAVENOUS | Status: DC

## 2023-11-17 MED ORDER — ETOMIDATE 2 MG/ML IV SOLN
INTRAVENOUS | Status: AC
Start: 2023-11-17 — End: 2023-11-17
  Filled 2023-11-17: qty 10

## 2023-11-17 MED ORDER — PHENYLEPHRINE HCL-NACL 20-0.9 MG/250ML-% IV SOLN
INTRAVENOUS | Status: DC | PRN
Start: 2023-11-17 — End: 2023-11-17
  Administered 2023-11-17: 30 ug/min via INTRAVENOUS

## 2023-11-17 MED ORDER — CHLORHEXIDINE GLUCONATE 4 % EX SOLN: 60.0000 mL | Freq: Once | CUTANEOUS | Status: DC

## 2023-11-17 MED ORDER — VASHE WOUND IRRIGATION OPTIME
TOPICAL | Status: DC | PRN
  Administered 2023-11-17: 34 [oz_av]

## 2023-11-17 MED ORDER — ORAL CARE MOUTH RINSE
15.0000 mL | Freq: Once | OROMUCOSAL | Status: AC
Start: 2023-11-17 — End: 2023-11-17

## 2023-11-17 MED ORDER — VANCOMYCIN HCL IN DEXTROSE 1-5 GM/200ML-% IV SOLN: 1000.0000 mg | INTRAVENOUS | Status: AC

## 2023-11-17 MED ORDER — CEFAZOLIN SODIUM-DEXTROSE 2-4 GM/100ML-% IV SOLN
INTRAVENOUS | Status: AC
  Filled 2023-11-17: qty 100

## 2023-11-17 MED ORDER — VANCOMYCIN HCL IN DEXTROSE 1-5 GM/200ML-% IV SOLN
INTRAVENOUS | Status: AC
Start: 2023-11-17 — End: 2023-11-17
  Administered 2023-11-17: 1000 mg via INTRAVENOUS
  Filled 2023-11-17: qty 200

## 2023-11-17 MED ORDER — MIDAZOLAM HCL 2 MG/2ML IJ SOLN
INTRAMUSCULAR | Status: AC
  Filled 2023-11-17: qty 2

## 2023-11-17 MED ORDER — FENTANYL CITRATE (PF) 100 MCG/2ML IJ SOLN
INTRAMUSCULAR | Status: AC
Start: 2023-11-17 — End: 2023-11-17
  Administered 2023-11-17: 50 ug via INTRAVENOUS
  Filled 2023-11-17: qty 2

## 2023-11-17 MED ORDER — DEXAMETHASONE SODIUM PHOSPHATE 10 MG/ML IJ SOLN
INTRAMUSCULAR | Status: AC
Start: 2023-11-17 — End: 2023-11-17
  Filled 2023-11-17: qty 1

## 2023-11-17 MED ORDER — PROPOFOL 10 MG/ML IV BOLUS
INTRAVENOUS | Status: AC
  Filled 2023-11-17: qty 20

## 2023-11-17 MED ORDER — LIDOCAINE 2% (20 MG/ML) 5 ML SYRINGE
INTRAMUSCULAR | Status: AC
Start: 2023-11-17 — End: 2023-11-17
  Filled 2023-11-17: qty 5

## 2023-11-17 MED ORDER — ETOMIDATE 2 MG/ML IV SOLN
INTRAVENOUS | Status: DC | PRN
  Administered 2023-11-17: 10 mg via INTRAVENOUS

## 2023-11-17 MED ORDER — CHLORHEXIDINE GLUCONATE 0.12 % MT SOLN
OROMUCOSAL | Status: AC
  Administered 2023-11-17: 15 mL via OROMUCOSAL
  Filled 2023-11-17: qty 15

## 2023-11-17 MED ORDER — POVIDONE-IODINE 10 % EX SWAB
2.0000 | Freq: Once | CUTANEOUS | Status: AC
  Administered 2023-11-17: 2 via TOPICAL

## 2023-11-17 SURGICAL SUPPLY — 45 items
BAG COUNTER SPONGE SURGICOUNT (BAG) ×2 IMPLANT
BLADE SAW RECIP 87.9 MT (BLADE) ×2 IMPLANT
BLADE SURG 21 STRL SS (BLADE) ×2 IMPLANT
BNDG COHESIVE 6X5 TAN ST LF (GAUZE/BANDAGES/DRESSINGS) IMPLANT
BNDG ELASTIC 4X5.8 VLCR STR LF (GAUZE/BANDAGES/DRESSINGS) IMPLANT
BNDG GAUZE DERMACEA FLUFF 4 (GAUZE/BANDAGES/DRESSINGS) IMPLANT
CANISTER SUCTION 3000ML PPV (SUCTIONS) ×2 IMPLANT
CANISTER WOUND CARE 500ML ATS (WOUND CARE) ×2 IMPLANT
COVER SURGICAL LIGHT HANDLE (MISCELLANEOUS) ×2 IMPLANT
CUFF TRNQT CYL 34X4.125X (TOURNIQUET CUFF) ×2 IMPLANT
DRAPE HALF SHEET 40X57 (DRAPES) IMPLANT
DRAPE INCISE IOBAN 66X45 STRL (DRAPES) ×2 IMPLANT
DRAPE SURG ORHT 6 SPLT 77X108 (DRAPES) IMPLANT
DRAPE U-SHAPE 47X51 STRL (DRAPES) ×2 IMPLANT
DRESSING PREVENA PLUS CUSTOM (GAUZE/BANDAGES/DRESSINGS) ×2 IMPLANT
DRSG VAC GRANUFOAM LG (GAUZE/BANDAGES/DRESSINGS) IMPLANT
DRSG VAC GRANUFOAM MED (GAUZE/BANDAGES/DRESSINGS) IMPLANT
DRSG VAC GRANUFOAM SM (GAUZE/BANDAGES/DRESSINGS) IMPLANT
DURAPREP 26ML APPLICATOR (WOUND CARE) ×2 IMPLANT
ELECTRODE REM PT RTRN 9FT ADLT (ELECTROSURGICAL) ×2 IMPLANT
GAUZE PAD ABD 8X10 STRL (GAUZE/BANDAGES/DRESSINGS) IMPLANT
GAUZE SPONGE 4X4 12PLY STRL (GAUZE/BANDAGES/DRESSINGS) IMPLANT
GAUZE XEROFORM 5X9 LF (GAUZE/BANDAGES/DRESSINGS) ×2 IMPLANT
GLOVE BIOGEL PI IND STRL 9 (GLOVE) ×2 IMPLANT
GLOVE SURG ORTHO 9.0 STRL STRW (GLOVE) ×2 IMPLANT
GOWN STRL REUS W/ TWL LRG LVL3 (GOWN DISPOSABLE) ×2 IMPLANT
GOWN STRL REUS W/ TWL XL LVL3 (GOWN DISPOSABLE) ×4 IMPLANT
GRAFT SKIN WND MICRO 38 (Tissue) IMPLANT
KIT BASIN OR (CUSTOM PROCEDURE TRAY) ×2 IMPLANT
KIT TURNOVER KIT B (KITS) ×2 IMPLANT
MANIFOLD NEPTUNE II (INSTRUMENTS) ×2 IMPLANT
NS IRRIG 1000ML POUR BTL (IV SOLUTION) ×2 IMPLANT
PACK ORTHO EXTREMITY (CUSTOM PROCEDURE TRAY) ×2 IMPLANT
PAD ARMBOARD POSITIONER FOAM (MISCELLANEOUS) ×2 IMPLANT
PREVENA RESTOR ARTHOFORM 46X30 (CANNISTER) ×2 IMPLANT
SPONGE T-LAP 18X18 ~~LOC~~+RFID (SPONGE) IMPLANT
STAPLER SKIN PROX 35W (STAPLE) IMPLANT
STOCKINETTE IMPERVIOUS LG (DRAPES) ×2 IMPLANT
SUT ETHILON 2 0 PSLX (SUTURE) IMPLANT
SUT SILK 2-0 18XBRD TIE 12 (SUTURE) ×2 IMPLANT
SUT VIC AB 1 CTX 27 (SUTURE) ×4 IMPLANT
TOWEL GREEN STERILE (TOWEL DISPOSABLE) ×2 IMPLANT
TOWEL GREEN STERILE FF (TOWEL DISPOSABLE) ×2 IMPLANT
TUBE CONNECTING 12X1/4 (SUCTIONS) ×2 IMPLANT
YANKAUER SUCT BULB TIP NO VENT (SUCTIONS) ×2 IMPLANT

## 2023-11-17 NOTE — Op Note (Signed)
 11/17/2023  10:46 AM  PATIENT:  Derek Thompson    PRE-OPERATIVE DIAGNOSIS:  RIGHT FOOT GANGRENE  POST-OPERATIVE DIAGNOSIS:  Same  PROCEDURE:  AMPUTATION BELOW KNEE, APPLICATION, WOUND VAC Application of Kerecis micro graft 38 cm. Application of Prevena customizable and Prevena arthroform wound VAC dressings Application of Vive Wear stump shrinker and the Hanger limb protector  SURGEON:  Jerona LULLA Sage, MD  ANESTHESIA:   General  PREOPERATIVE INDICATIONS:  Derek Thompson is a  63 y.o. male with a diagnosis of RIGHT FOOT GANGRENE who failed conservative measures and elected for surgical management.    The risks benefits and alternatives were discussed with the patient preoperatively including but not limited to the risks of infection, bleeding, nerve injury, cardiopulmonary complications, the need for revision surgery, among others, and the patient was willing to proceed.  OPERATIVE IMPLANTS:   Implant Name Type Inv. Item Serial No. Manufacturer Lot No. LRB No. Used Action  GRAFT SKIN WND MICRO 38 - [id number] Tissue GRAFT SKIN WND MICRO 38  [employer] [id number] Right 1 Implanted     OPERATIVE FINDINGS: muscle with good color and contractility  OPERATIVE PROCEDURE: Patient was brought to the operating room after undergoing a regional anesthetic.  After adequate levels anesthesia were obtained a thigh tourniquet was placed and the lower extremity was prepped using DuraPrep draped into a sterile field. The foot was draped out of the sterile field with impervious stockinette.  A timeout was called and the tourniquet inflated.  A transverse skin incision was made 12 cm distal to the tibial tubercle, the incision curved proximally, and a large posterior flap was created.  The tibia was transected just proximal to the skin incision and beveled anteriorly.  The fibula was transected just proximal to the tibial incision.  The sciatic nerve was pulled cut and allowed to retract.   The vascular bundles were suture ligated with 2-0 silk.  The tourniquet was deflated and hemostasis obtained.  The wound was irrigated with Vashe.   The Kerecis micro powder 38 cm was applied to the open wound that has a 200 cm surface area.    The deep and superficial fascial layers were closed using #1 Vicryl.  The skin was closed using staples.    The Prevena customizable dressing was applied this was overwrapped with the arthroform sponge.  Parthenia was used to secure the sponges and the circumferential compression was secured to the skin with Dermatac.  This was connected to the wound VAC pump and had a good suction fit this was covered with a stump shrinker and a limb protector.  Patient was taken to the PACU in stable condition.   DISCHARGE PLANNING:  Antibiotic duration: 24-hour antibiotics  Weightbearing: Nonweightbearing on the operative extremity  Pain medication: Opioid pathway  Dressing care/ Wound VAC: Continue wound VAC with the Prevena plus pump at discharge for 1 week  Ambulatory devices: Walker or kneeling scooter  Discharge to: Discharge planning based on recommendations per physical therapy  Follow-up: In the office 1 week after discharge.

## 2023-11-17 NOTE — Progress Notes (Signed)
 HD#14 SUBJECTIVE:  Patient Summary: Derek Thompson is a 63 y.o. 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 He was in good spirits reporting that its been a long road, but he is exciting to take a step towards healing. He denies pain and is ready to get the surgery done. All questions and concerns were addressed at this time.    OBJECTIVE:  Vital Signs: Vitals:   11/16/23 2107 11/17/23 0509 11/17/23 0717 11/17/23 0758  BP: 113/72 112/65 109/69 108/68  Pulse:  90 91 89  Resp: 18 18 18 18   Temp: 98.1 F (36.7 C) 97.9 F (36.6 C) 98.3 F (36.8 C) 97.9 F (36.6 C)  TempSrc:    Oral  SpO2: 99% 94% 98% 99%  Weight:    63.3 kg  Height:    6' (1.829 m)   Supplemental O2: Room Air SpO2: 99 % O2 Flow Rate (L/min): 2 L/min  Filed Weights   11/13/23 0426 11/14/23 0500 11/17/23 0758  Weight: 64.4 kg 63.3 kg 63.3 kg     Intake/Output Summary (Last 24 hours) at 11/17/2023 0802 Last data filed at 11/16/2023 1700 Gross per 24 hour  Intake 360 ml  Output 500 ml  Net -140 ml   Net IO Since Admission: -10,989.19 mL [11/17/23 0802]  Physical Exam: Const: Awake, alert in NAD HENT: Normocephalic, atraumatic, Card: No pitting edema on LE's bilaterally  Resp: No increased work of breathing Extremities: Warm. RLE bandage in place over foot with discoloration of the toes.  Patient Lines/Drains/Airways Status     Active Line/Drains/Airways     Name Placement date Placement time Site Days   Peripheral IV 11/03/23 22 G 1.75 Left;Proximal Forearm 11/03/23  0945  Forearm  1   Peripheral IV 11/04/23 20 G 1 Posterior;Right Forearm 11/04/23  0845  Forearm  less than 1    Urethral Catheter Mekides Nida, RN Coude 16 Fr. 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, reddened foot area with purulent drainage present 09/01/23  1721  Foot  64   Wound 11/02/23 1655 Vascular Ulcer Foot Anterior;Right 11/02/23  1655  Foot  2   Wound 11/02/23 1658 Vascular Ulcer Foot Right;Lateral 11/02/23  1658  Foot  2            Pertinent labs and imaging:      Latest Ref Rng & Units 11/16/2023    8:52 AM 11/14/2023    5:32 AM 11/12/2023    5:00 AM  CBC  WBC 4.0 - 10.5 K/uL 5.7  5.9  5.4   Hemoglobin 13.0 - 17.0 g/dL 8.7  9.0  8.8   Hematocrit 39.0 - 52.0 % 28.2  27.9  28.9   Platelets 150 - 400 K/uL 110  132  189        Latest Ref Rng & Units 11/17/2023    4:36 AM 11/16/2023    8:52 AM 11/15/2023    4:44 AM  CMP  Glucose 70 - 99 mg/dL 872  846  853   BUN 8 - 23 mg/dL 36  32  34   Creatinine 0.61 -  1.24 mg/dL 7.83  7.89  7.96   Sodium 135 - 145 mmol/L 131  132  131   Potassium 3.5 - 5.1 mmol/L 5.1  4.9  4.9   Chloride 98 - 111 mmol/L 105  108  103   CO2 22 - 32 mmol/L 19  15  19    Calcium  8.9 - 10.3 mg/dL 8.6  8.4  8.6   Total Protein 6.5 - 8.1 g/dL 7.1   7.1   Total Bilirubin 0.0 - 1.2 mg/dL 0.7   0.6   Alkaline Phos 38 - 126 U/L 37   39   AST 15 - 41 U/L 16   13   ALT 0 - 44 U/L 21   20    IR BONE MARROW BIOPSY & ASPIRATION Result Date: 11/16/2023 INDICATION: M spike protein.  Anemia. EXAM: FLUOROSCOPIC GUIDED BONE MARROW BIOPSY AND ASPIRATION MEDICATIONS: None FLUOROSCOPY: Radiation Exposure Index and estimated peak skin dose (PSD); Reference air kerma (RAK), 1.0 mGy. ANESTHESIA/SEDATION: Moderate (conscious) sedation was employed during this procedure. A total of Versed  1.5 mg and Fentanyl  50 mcg was administered intravenously. Moderate Sedation Time: 10 minutes. The patient's level of consciousness and vital signs were monitored continuously by  radiology nursing throughout the procedure under my direct supervision. COMPLICATIONS: None immediate. PROCEDURE: Informed consent was obtained from the patient following an explanation of the procedure, risks, benefits and alternatives. The patient understands, agrees and consents for the procedure. All questions were addressed. A time out was performed prior to the initiation of the procedure. The patient was positioned prone on the fluoroscopy table and the posterior aspect of the RIGHT iliac crest was marked fluoroscopically. The operative site was prepped and draped in the usual sterile fashion. Under sterile conditions and local anesthesia, an 11 gauge coaxial bone biopsy needle was advanced into the posterior aspect of the RIGHT iliac marrow space under intermittent fluoroscopic guidance. Multiple fluoroscopic images were saved procedural documentation purposes. Initially, a bone marrow aspiration was performed. Next, a bone marrow biopsy was obtained with the 11 gauge outer bone marrow device. The needle was removed and superficial hemostasis was obtained with manual compression. A dressing was applied. The patient tolerated the procedure well without immediate post procedural complication. IMPRESSION: Successful fluoroscopic-guided RIGHT iliac bone marrow aspiration and core biopsy. Thom Hall, MD [facility] Electronically Signed   By: Thom Hall M.D.   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 This hospitalization it has continued to worsen. He was not on medications prior to admission and has not wished to treat it in the past.  -Continue Doxycycline  and Augmentin  for suppression due to Step anginosis culture and sensitivities last wound culture. -Right BKA this morning with Dr.  Duda  Elevated Protein gap  SPEP with Positive M-Spike SPEP with gamma globulin 2.1 and Mspike 1.1. Kappa/Lambda light chain ratio 0.58. IgG elevated at 2,664 with lamba specificity, UPEP bence jones protein positive.  -This could also be contributing to hyponatremia in form of a pseudohyponatremia.  -Dr. Autumn with hematology consulted and following -Bone marrow biopsy 7/17--awaiting results  Hyponatremia Likely multifactorial, including cardiorenal syndrome, AKI, bladder outlet obstruction, and hypovolemia. Pt continues to mildly improve with fluids, but this is not sustained. Could potentially be due to pseudohyponatremia due to presence of monoclonal gammopathy.   Debility -Pt currently on hospital day 15 with decreased mobility and the vast majority of time spent in the bed. Will continue to encourage working with PT/OT and out of bed throughout the day.  -BKA today. Will require PT tomorrow with assistance in transfers.   Sepsis Possible PNA -Resolved.  -Continue Doxycycline  and augmentin  for osteomyelitis suppression.   AKI Bladder outlet obstruction with BL hydroureter UTI NAGMA Since admission, AKI has significantly improved. Urine output has slowed down within the last few days. Will keep coude catheter in place. Urine culture negative.  -Renal function likely impacted by monoclonal gammopathy of renal significance.  Hematology following -Will reach out to urology today for guidance managing catheter after discharge.  -Sodium Bicarb 1300 TID for persistent NAGMA. Bicarb 19 today  Biventricular HFrEF EF 20-25%.  Persistent Tacchycardia Hx of nonobstructive CAD. Nonadherent to medications. Not a candidate for advanced therapies. Suspicion of prior MI but none documented in the chart. Remains peripherally euvolemic. Pt has not had anymore episodes of tachypnea or orthopnea - Monitor electrolytes; goal Mg 2, K ~4. - Monitor urine output. - Continue metoprolol  25mg  BID  Hx of LV thrombus (not visualized on TTE and MR 10/2023) New subscapular splenic infarct Nonadherent to Eliquis  at home. Eliquis  held due to downtrending Hgb. Will continue to hold due to anemia, fluctuating Hgb, and potential BKA this week. -Lovenox  on hold due to procedures today and tomorrow.   Acute on chronic anemia -Low sat ratios during last hospitalization.  -S/p IV Iron  7/8. Will continue daily CBC's. Transfuse if Hbg <7  Encephalopathy Delirium Cefepime  neurotoxicity Resolved.   Multisystem organ failure Goals of care Palliative Consult Severe protein calorie malnutrition -Discussed multisystem organ failure and recurrent hospitalizations -Palliative care saw pt and pt expressed desire to continue to be full code, full scope.  -At this time, he and his family wish to continue aggressive care. -Per PT eval, Pt to d/c to SNF  Best Practice: Diet: Renal diet IVF: Fluids: none VTE: Lovenox  Code: Full  Disposition planning: Therapy Recs: Pending, DME: other pending DISPO: Anticipated discharge pending PT eval pending clinical improvement and R BKA.  Signature:  Schuyler Novak, DO [facility]  8:02 AM, 11/17/2023  On Call pager [phone]

## 2023-11-17 NOTE — Plan of Care (Addendum)

## 2023-11-17 NOTE — Interval H&P Note (Signed)
 History and Physical Interval Note:  11/17/2023 6:45 AM  Derek Thompson  has presented today for surgery, with the diagnosis of RIGHT FOOT GANGRENE.  The various methods of treatment have been discussed with the patient and family. After consideration of risks, benefits and other options for treatment, the patient has consented to  Procedure(s): AMPUTATION BELOW KNEE (Right) APPLICATION, WOUND VAC (Right) as a surgical intervention.  The patient's history has been reviewed, patient examined, no change in status, stable for surgery.  I have reviewed the patient's chart and labs.  Questions were answered to the patient's satisfaction.     [name] V [name]

## 2023-11-17 NOTE — Progress Notes (Signed)
 [facility]     May start DVT prophylaxis when vac has < 50 cc drainage in 24 hour period.     Maurilio Deland Collet PA-C  [phone]

## 2023-11-17 NOTE — TOC Progression Note (Signed)
 Transition of Care [facility]) - Progression Note    Patient Details  Name: Derek Thompson MRN: [id number] Date of Birth: [birth year]  Transition of Care [facility] CM/SW Contact  Lauraine FORBES Saa, LCSW Phone Number: 11/17/2023, 12:14 PM  Clinical Narrative:     12:15 PM Per chart review, patient is not yet medically ready to discharge to [facility] SNF. CSW will continue to follow and be available to assist.  Expected Discharge Plan: Skilled Nursing Facility Barriers to Discharge: Continued Medical Work up, English as a second language teacher  Expected Discharge Plan and Services In-house Referral: Clinical Social Work   Post Acute Care Choice: Skilled Nursing Facility Living arrangements for the past 2 months: Homeless Shelter                                       Social Determinants of Health (SDOH) Interventions SDOH Screenings   Food Insecurity: No Food Insecurity (11/02/2023)  Housing: High Risk (11/02/2023)  Transportation Needs: Unmet Transportation Needs (11/02/2023)  Utilities: Not At Risk (11/02/2023)  Alcohol Screen: Low Risk  (08/19/2022)  Depression (PHQ2-9): Low Risk  (10/11/2023)  Financial Resource Strain: Low Risk  (08/19/2022)  Tobacco Use: Medium Risk (11/17/2023)    Readmission Risk Interventions     No data to display

## 2023-11-17 NOTE — Transfer of Care (Signed)
 Immediate Anesthesia Transfer of Care Note  Patient: Derek Thompson  Procedure(s) Performed: AMPUTATION BELOW KNEE (Right: Knee) APPLICATION, WOUND VAC (Right)  Patient Location: PACU  Anesthesia Type:General  Level of Consciousness: drowsy  Airway & Oxygen Therapy: Patient Spontanous Breathing and Patient connected to face mask oxygen  Post-op Assessment: Report given to RN and Post -op Vital signs reviewed and stable  Post vital signs: Reviewed and stable  Last Vitals:  Vitals Value Taken Time  BP 109/69   Temp 97.8   Pulse 82   Resp 15   SpO2 99     Last Pain:  Vitals:   11/17/23 0811  TempSrc:   PainSc: 0-No pain      Patients Stated Pain Goal: 0 (11/15/23 0007)  Complications: No notable events documented.

## 2023-11-17 NOTE — Progress Notes (Signed)
   11/17/23 2045  Provider Notification  Provider Name/Title Dr. Patel/Dr. Edgardo  Date Provider Notified 11/17/23  Time Provider Notified 2042  Method of Notification  (secure chat)  Notification Reason Other (Comment) ([id number])  Provider response See new orders  Date of Provider Response 11/17/23  Time of Provider Response 2045   Pt admitted with right foot osteomyelitis, right BKA today. Blood sugars running high most of the day. Received 15 units of novolog  at 1800 for BS-536. Pt's blood glucose actually increased with last 2 checks. Blood glucose-558 at 2000. New order noted for STAT BMP, 15 units of novolog . Pt asymptomatic at this time.

## 2023-11-17 NOTE — Anesthesia Procedure Notes (Signed)
 Procedure Name: LMA Insertion Date/Time: 11/17/2023 10:10 AM  Performed by: Atanacio Arland HERO, CRNAPre-anesthesia Checklist: Patient identified, Emergency Drugs available, Suction available and Patient being monitored Patient Re-evaluated:Patient Re-evaluated prior to induction Oxygen Delivery Method: Circle System Utilized Preoxygenation: Pre-oxygenation with 100% oxygen Induction Type: IV induction Ventilation: Mask ventilation without difficulty LMA: LMA inserted LMA Size: 4.0 Number of attempts: 1 Placement Confirmation: positive ETCO2 Tube secured with: Tape Dental Injury: Teeth and Oropharynx as per pre-operative assessment

## 2023-11-17 NOTE — Progress Notes (Signed)
   11/17/23 2219  Provider Notification  Provider Name/Title Dr. Tobie  Date Provider Notified 11/17/23  Time Provider Notified 2219  Method of Notification  (secure chat)  Notification Reason Critical Result (serum glucose-535, fingerstick-463, BP-94/56, hold metoprolol ?)  Test performed and critical result Glucose  Date Critical Result Received 11/17/23  Time Critical Result Received 2155  Provider response See new orders  Date of Provider Response 11/17/23  Time of Provider Response 2220   Continue to monitor glucose and hold metoprolol , per Dr. Tobie.

## 2023-11-18 DIAGNOSIS — M869 Osteomyelitis, unspecified: Secondary | ICD-10-CM

## 2023-11-18 DIAGNOSIS — E871 Hypo-osmolality and hyponatremia: Secondary | ICD-10-CM | POA: Diagnosis not present

## 2023-11-18 LAB — RENAL FUNCTION PANEL
Albumin: 2 g/dL — ABNORMAL LOW (ref 3.5–5.0)
Anion gap: 10 (ref 5–15)
BUN: 48 mg/dL — ABNORMAL HIGH (ref 8–23)
CO2: 19 mmol/L — ABNORMAL LOW (ref 22–32)
Calcium: 8.7 mg/dL — ABNORMAL LOW (ref 8.9–10.3)
Chloride: 102 mmol/L (ref 98–111)
Creatinine, Ser: 2.25 mg/dL — ABNORMAL HIGH (ref 0.61–1.24)
GFR, Estimated: 32 mL/min — ABNORMAL LOW (ref >60)
Glucose, Bld: 263 mg/dL — ABNORMAL HIGH (ref 70–99)
Phosphorus: 3.7 mg/dL (ref 2.5–4.6)
Potassium: 5.1 mmol/L (ref 3.5–5.1)
Sodium: 131 mmol/L — ABNORMAL LOW (ref 135–145)

## 2023-11-18 LAB — CBC
HCT: 29 % — ABNORMAL LOW (ref 39.0–52.0)
Hemoglobin: 9.3 g/dL — ABNORMAL LOW (ref 13.0–17.0)
MCH: 27.6 pg (ref 26.0–34.0)
MCHC: 32.1 g/dL (ref 30.0–36.0)
MCV: 86.1 fL (ref 80.0–100.0)
Platelets: 227 K/uL (ref 150–400)
RBC: 3.37 MIL/uL — ABNORMAL LOW (ref 4.22–5.81)
RDW: 16.6 % — ABNORMAL HIGH (ref 11.5–15.5)
WBC: 8 K/uL (ref 4.0–10.5)
nRBC: 0 % (ref 0.0–0.2)

## 2023-11-18 LAB — GLUCOSE, CAPILLARY
Glucose-Capillary: 295 mg/dL — ABNORMAL HIGH (ref 70–99)
Glucose-Capillary: 286 mg/dL — ABNORMAL HIGH (ref 70–99)
Glucose-Capillary: 275 mg/dL — ABNORMAL HIGH (ref 70–99)
Glucose-Capillary: 244 mg/dL — ABNORMAL HIGH (ref 70–99)
Glucose-Capillary: 202 mg/dL — ABNORMAL HIGH (ref 70–99)

## 2023-11-18 MED ORDER — INSULIN ASPART 100 UNIT/ML IJ SOLN
0.0000 [IU] | Freq: Every day | INTRAMUSCULAR | Status: DC
  Administered 2023-11-18 – 2023-11-19 (×2): 2 [IU] via SUBCUTANEOUS
  Administered 2023-11-27: 3 [IU] via SUBCUTANEOUS

## 2023-11-18 MED ORDER — APIXABAN 5 MG PO TABS
5.0000 mg | ORAL_TABLET | Freq: Two times a day (BID) | ORAL | Status: DC
  Administered 2023-11-18 – 2023-12-29 (×87): 5 mg via ORAL
  Filled 2023-11-18 (×83): qty 1

## 2023-11-18 MED ORDER — INSULIN ASPART 100 UNIT/ML IJ SOLN
0.0000 [IU] | Freq: Three times a day (TID) | INTRAMUSCULAR | Status: DC
  Administered 2023-11-18: 8 [IU] via SUBCUTANEOUS
  Administered 2023-11-18: 5 [IU] via SUBCUTANEOUS
  Administered 2023-11-19: 2 [IU] via SUBCUTANEOUS
  Administered 2023-11-19: 3 [IU] via SUBCUTANEOUS
  Administered 2023-11-19: 5 [IU] via SUBCUTANEOUS
  Administered 2023-11-20: 8 [IU] via SUBCUTANEOUS
  Administered 2023-11-20: 5 [IU] via SUBCUTANEOUS
  Administered 2023-11-20: 2 [IU] via SUBCUTANEOUS
  Administered 2023-11-21: 3 [IU] via SUBCUTANEOUS
  Administered 2023-11-21: 15 [IU] via SUBCUTANEOUS
  Administered 2023-11-21: 3 [IU] via SUBCUTANEOUS
  Administered 2023-11-22: 11 [IU] via SUBCUTANEOUS
  Administered 2023-11-22: 2 [IU] via SUBCUTANEOUS
  Administered 2023-11-23 (×2): 3 [IU] via SUBCUTANEOUS
  Administered 2023-11-23: 2 [IU] via SUBCUTANEOUS
  Administered 2023-11-24 (×2): 5 [IU] via SUBCUTANEOUS
  Administered 2023-11-25: 8 [IU] via SUBCUTANEOUS
  Administered 2023-11-26: 3 [IU] via SUBCUTANEOUS
  Administered 2023-11-26 – 2023-11-27 (×3): 5 [IU] via SUBCUTANEOUS
  Administered 2023-11-27 – 2023-11-28 (×2): 3 [IU] via SUBCUTANEOUS

## 2023-11-18 NOTE — Evaluation (Addendum)
 Physical Therapy Re-Evaluation Patient Details Name: Derek Thompson MRN: [id number] DOB: 09-22-1960 Today's Date: 11/18/2023  History of Present Illness  Pt is a 63 y/o M presenting to ED on 11/02/23 with lethargy. Pt found to be tachycardic, hyperkalemic, and hyponatremic. 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  Clinical Impression  Pt re-evaluated s/p R BKA 7/18. Pt verbalizes good pain control and is overall mobilizing fairly. Pt noted to have episode of bowel incontinence and assisted onto bedside commode with a RW. Assist provided for posterior peri care in standing position. Utilized Stedy to transition over to [employer] Goals updated. Patient will benefit from continued inpatient follow up therapy, <3 hours/day.      If plan is discharge home, recommend the following: A lot of help with bathing/dressing/bathroom;Supervision due to cognitive status;Help with stairs or ramp for entrance;A lot of help with walking and/or transfers;Assistance with cooking/housework;Assist for transportation   Can travel by private vehicle   No    Equipment Recommendations Rolling walker (2 wheels);BSC/3in1;Wheelchair (measurements PT)  Recommendations for Other Services       Functional Status Assessment Patient has had a recent decline in their functional status and/or demonstrates limited ability to make significant improvements in function in a reasonable and predictable amount of time     Precautions / Restrictions Precautions Precautions: Fall;Other (comment) Recall of Precautions/Restrictions: Impaired Precaution/Restrictions Comments: R limb guard protector, wound vac Restrictions Weight Bearing Restrictions Per Provider  Order: Yes RLE Weight Bearing Per Provider Order: Non weight bearing      Mobility  Bed Mobility Overal bed mobility: Needs Assistance Bed Mobility: Supine to Sit     Supine to sit: Contact guard     General bed mobility comments: CGA for safety, increased time    Transfers Overall transfer level: Needs assistance Equipment used: Rolling walker (2 wheels), None Transfers: Sit to/from Stand, Bed to chair/wheelchair/BSC Sit to Stand: Mod assist, +2 safety/equipment, Via lift equipment Stand pivot transfers: Mod assist, +2 safety/equipment         General transfer comment: ModA to power up to standing and steady. Pivoting towards BSC with increased time and assist for balance. Use of Stedy from [facility] to chair    Ambulation/Gait               General Gait Details: unable  Stairs            Wheelchair Mobility     Tilt Bed    Modified Rankin (Stroke Patients Only)       Balance Overall balance assessment: Needs assistance Sitting-balance support: Feet supported, No upper extremity supported Sitting balance-Leahy Scale: Fair     Standing balance support: Bilateral upper extremity supported Standing balance-Leahy Scale: Poor                               Pertinent Vitals/Pain Pain Assessment Pain Assessment: No/denies pain    Home Living Family/patient expects to be discharged to:: Shelter/Homeless                        Prior Function Prior Level of Function : Independent/Modified Independent  Mobility Comments: pt reports using a RW ADLs Comments: reports indep     Extremity/Trunk Assessment   Upper Extremity Assessment Upper Extremity Assessment: Defer to OT evaluation    Lower Extremity Assessment Lower Extremity Assessment: RLE deficits/detail RLE Deficits / Details: s/p BKA. Grossly 3-/5    Cervical / Trunk Assessment Cervical / Trunk Assessment: Normal  Communication    Communication Communication: No apparent difficulties    Cognition Arousal: Alert Behavior During Therapy: WFL for tasks assessed/performed   PT - Cognitive impairments: Initiation, Problem solving, Sequencing, Attention                         Following commands: Impaired Following commands impaired: Follows one step commands with increased time, Follows multi-step commands with increased time     Cueing Cueing Techniques: Verbal cues, Tactile cues, Gestural cues     General Comments      Exercises Amputee Exercises Quad Sets: AROM, Right, 5 reps, Supine Hip ABduction/ADduction: AROM, Right, 5 reps, Supine Straight Leg Raises: AROM, Right, 5 reps, Supine   Assessment/Plan    PT Assessment Patient needs continued PT services  PT Problem List Decreased strength;Decreased activity tolerance;Decreased balance;Decreased mobility;Decreased coordination;Decreased cognition;Decreased safety awareness       PT Treatment Interventions DME instruction;Gait training;Stair training;Functional mobility training;Therapeutic exercise;Therapeutic activities;Balance training;Neuromuscular re-education;Patient/family education;Wheelchair mobility training;Manual techniques;Modalities    PT Goals (Current goals can be found in the Care Plan section)  Acute Rehab PT Goals Patient Stated Goal: did not state PT Goal Formulation: With patient Time For Goal Achievement: 12/02/23 Potential to Achieve Goals: Good Additional Goals Additional Goal #1: Pt will propel w/c with BUE's x 150 ft with supervision    Frequency Min 2X/week     Co-evaluation               AM-PAC PT 6 Clicks Mobility  Outcome Measure Help needed turning from your back to your side while in a flat bed without using bedrails?: A Little Help needed moving from lying on your back to sitting on the side of a flat bed without using bedrails?: A Little Help needed moving to and from a bed to a chair  (including a wheelchair)?: A Little Help needed standing up from a chair using your arms (e.g., wheelchair or bedside chair)?: A Lot Help needed to walk in hospital room?: Total Help needed climbing 3-5 steps with a railing? : Total 6 Click Score: 13    End of Session Equipment Utilized During Treatment: Gait belt Activity Tolerance: Patient tolerated treatment well Patient left: in chair;with call bell/phone within reach;with chair alarm set Nurse Communication: Mobility status PT Visit Diagnosis: Unsteadiness on feet (R26.81);Difficulty in walking, not elsewhere classified (R26.2);Other abnormalities of gait and mobility (R26.89)    Time: 1005-1035 PT Time Calculation (min) (ACUTE ONLY): 30 min   Charges:   PT Evaluation $PT Re-evaluation: 1 Re-eval PT Treatments $Therapeutic Activity: 8-22 mins PT General Charges $$ ACUTE PT VISIT: 1 Visit         Aleck Daring, PT, DPT Acute Rehabilitation Services Office [phone]   Alayne ONEIDA Daring 11/18/2023, 12:30 PM

## 2023-11-18 NOTE — Progress Notes (Signed)
 OT Cancellation Note  Patient Details Name: Derek Thompson MRN: [id number] DOB: 1961-03-15   Cancelled Treatment:    Reason Eval/Treat Not Completed: (P) Patient declined, worked with PT earlier, tired and asked to return tomorrow, will check back.   [name] 11/18/2023, 4:18 PM

## 2023-11-18 NOTE — Plan of Care (Signed)

## 2023-11-18 NOTE — Progress Notes (Signed)
 Patient ID: Derek Thompson, male   DOB: 07-14-1960, 63 y.o.   MRN: [id number] Patient is postoperative day 1 right below-knee amputation.  Patient states he has no pain.  There is no drainage of the wound VAC canister there is a good suction fit.  Discharge planning based on therapy recommendations.

## 2023-11-18 NOTE — Progress Notes (Addendum)
 HD#15 SUBJECTIVE:  Patient Summary: Derek Thompson is a 63 y.o. with a pertinent PMH of End-stage HFrEF, CKD, CVA, bladder outlet obstruction s/p coude foley catheter during last admission 10/2023, and chronic osteomyelitis , admitted hypovolemic hyponatremia and AKI with coude foley and on CTX, improving  Overnight Events: Pt's BG elevated to 591. He received 32 units SSI.   Interim History: Pt seen and examined before surgery this morning. He was very interactive with the conversation today. He reports that his blood sugar spiking last night was his fault because he ordered a very carb heavy dinner last night. He denies pain and has no questions or concerns today.    OBJECTIVE:  Vital Signs: Vitals:   11/17/23 2200 11/18/23 0500 11/18/23 0501 11/18/23 0826  BP: (!) 94/56  106/72 106/64  Pulse: 80  68 72  Resp: 15   17  Temp: 98 F (36.7 C)  97.9 F (36.6 C) 97.6 F (36.4 C)  TempSrc: Oral     SpO2: 96%  100% 100%  Weight:  61.9 kg    Height:       Supplemental O2: Room Air SpO2: 100 % O2 Flow Rate (L/min): 5 L/min  Filed Weights   11/14/23 0500 11/17/23 0758 11/18/23 0500  Weight: 63.3 kg 63.3 kg 61.9 kg     Intake/Output Summary (Last 24 hours) at 11/18/2023 0842 Last data filed at 11/18/2023 0211 Gross per 24 hour  Intake 790 ml  Output 1450 ml  Net -660 ml   Net IO Since Admission: -11,649.19 mL [11/18/23 0842]  Physical Exam: Const: Awake, alert in NAD HENT: Normocephalic, atraumatic, Card: RRR, No pitting edema on LE's bilaterally  Resp: LCTAB, No increased work of breathing Extremities: RLE BKA with would vac and rigid BKA protector in place.   Patient Lines/Drains/Airways Status     Active Line/Drains/Airways     Name Placement date Placement time Site Days   Peripheral IV 11/03/23 22 G 1.75 Left;Proximal Forearm 11/03/23  0945  Forearm  1   Peripheral IV 11/04/23 20 G 1 Posterior;Right Forearm 11/04/23  0845  Forearm  less than 1    Urethral Catheter Mekides Nida, RN Coude 16 Fr. 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, reddened foot area with purulent drainage present 09/01/23  1721  Foot  63   Wound 11/02/23 1655 Vascular Ulcer Foot Anterior;Right 11/02/23  1655  Foot  2   Wound 11/02/23 1658 Vascular Ulcer Foot Right;Lateral 11/02/23  1658  Foot  2            Pertinent labs and imaging:      Latest Ref Rng & Units 11/18/2023    5:12 AM 11/16/2023    8:52 AM 11/14/2023    5:32 AM  CBC  WBC 4.0 - 10.5 K/uL 8.0  5.7  5.9   Hemoglobin 13.0 - 17.0 g/dL 9.3  8.7  9.0   Hematocrit 39.0 - 52.0 % 29.0  28.2  27.9   Platelets 150 - 400 K/uL 227  110  132        Latest Ref Rng & Units 11/18/2023    5:12 AM 11/17/2023    9:10 PM 11/17/2023    4:36 AM  CMP  Glucose 70 - 99 mg/dL 736  464  872   BUN 8 - 23 mg/dL 48  46  36   Creatinine  0.61 - 1.24 mg/dL 7.74  7.56  7.83   Sodium 135 - 145 mmol/L 131  128  131   Potassium 3.5 - 5.1 mmol/L 5.1  5.4  5.1   Chloride 98 - 111 mmol/L 102  101  105   CO2 22 - 32 mmol/L 19  20  19    Calcium  8.9 - 10.3 mg/dL 8.7  8.1  8.6   Total Protein 6.5 - 8.1 g/dL   7.1   Total Bilirubin 0.0 - 1.2 mg/dL   0.7   Alkaline Phos 38 - 126 U/L   37   AST 15 - 41 U/L   16   ALT 0 - 44 U/L   21    No results found.     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 This hospitalization it has continued to worsen. He was not on medications prior to admission and has not wished to treat it in the past.  -POD 1 R BKA with Dr. Harden.  -Complete suppression antibiotics today.  Elevated Protein gap  SPEP with Positive M-Spike SPEP with gamma globulin 2.1 and Mspike 1.1. Kappa/Lambda light chain ratio 0.58. IgG elevated at 2,664 with lamba specificity, UPEP bence jones protein positive.  -This could also be contributing to hyponatremia in form of a pseudohyponatremia.  -Dr. Autumn with hematology consulted and following -Bone marrow biopsy 7/17--awaiting results  Hyponatremia Likely multifactorial, including cardiorenal syndrome, AKI, bladder outlet obstruction, and hypovolemia. Pt continues to mildly improve with fluids, but this is not sustained. -Could potentially be due to pseudohyponatremia due to presence of monoclonal gammopathy.   Debility -Pt currently on hospital day 16 with decreased mobility and the vast majority of time spent in the bed. Will continue to encourage working with PT/OT and out of bed throughout the day.  -POD 1 R BKA. PT to eval today.   AKI Bladder outlet obstruction with BL hydroureter UTI NAGMA Since admission, AKI has significantly improved. Developed post-ATN diuresis, which has slowed down over the previous days. Will keep coude catheter in place. Urine culture negative.  -Renal function likely impacted by monoclonal gammopathy of renal significance. Hematology following -Will reach out to urology for guidance managing catheter after discharge.  -Sodium Bicarb 1300 TID for persistent NAGMA. Bicarb 20 today  Biventricular HFrEF EF 20-25%.  Persistent Tacchycardia Hx of nonobstructive CAD. Nonadherent to medications. Not a candidate for advanced therapies.  Suspicion of prior MI but none documented in the chart. Remains peripherally euvolemic. Pt has not had anymore episodes of tachypnea or orthopnea - Monitor electrolytes; goal Mg 2, K ~4. - Monitor urine output. - Continue metoprolol  25mg  BID  T2DM -BG spiked to 591 last night -SSI adjusted this morning. Will monitor closely  Hx of LV thrombus (not visualized on TTE and  MR 10/2023) New subscapular splenic infarct -Will resume Eliquis  5mg  BID today  Acute on chronic anemia -Low sat ratios during last hospitalization.  -S/p IV Iron  7/8. Will continue daily CBC's. Transfuse if Hbg <7  Encephalopathy Delirium Cefepime  neurotoxicity Resolved.   Sepsis Possible PNA -Resolved.   Multisystem organ failure Goals of care Palliative Consult Severe protein calorie malnutrition -Discussed multisystem organ failure and recurrent hospitalizations -Palliative care saw pt and pt expressed desire to continue to be full code, full scope.  -At this time, he and his family wish to continue aggressive care. -Per PT eval, Pt to d/c to SNF  Best Practice: Diet: Renal diet IVF: Fluids: none VTE: Lovenox  Code: Full  Disposition planning: Therapy Recs: Pending, DME: other pending DISPO: Anticipated discharge pending PT eval pending clinical improvement and R BKA.  Signature:  Schuyler Novak, DO [facility]  8:42 AM, 11/18/2023  On Call pager [phone]

## 2023-11-19 DIAGNOSIS — N179 Acute kidney failure, unspecified: Secondary | ICD-10-CM | POA: Diagnosis not present

## 2023-11-19 DIAGNOSIS — E1169 Type 2 diabetes mellitus with other specified complication: Secondary | ICD-10-CM

## 2023-11-19 DIAGNOSIS — M86171 Other acute osteomyelitis, right ankle and foot: Secondary | ICD-10-CM | POA: Diagnosis not present

## 2023-11-19 DIAGNOSIS — E1165 Type 2 diabetes mellitus with hyperglycemia: Secondary | ICD-10-CM | POA: Diagnosis not present

## 2023-11-19 LAB — GLUCOSE, CAPILLARY
Glucose-Capillary: 224 mg/dL — ABNORMAL HIGH (ref 70–99)
Glucose-Capillary: 182 mg/dL — ABNORMAL HIGH (ref 70–99)
Glucose-Capillary: 132 mg/dL — ABNORMAL HIGH (ref 70–99)
Glucose-Capillary: 213 mg/dL — ABNORMAL HIGH (ref 70–99)

## 2023-11-19 LAB — CBC
HCT: 28.3 % — ABNORMAL LOW (ref 39.0–52.0)
Hemoglobin: 9 g/dL — ABNORMAL LOW (ref 13.0–17.0)
MCH: 27.5 pg (ref 26.0–34.0)
MCHC: 31.8 g/dL (ref 30.0–36.0)
MCV: 86.5 fL (ref 80.0–100.0)
Platelets: 299 K/uL (ref 150–400)
RBC: 3.27 MIL/uL — ABNORMAL LOW (ref 4.22–5.81)
RDW: 17.2 % — ABNORMAL HIGH (ref 11.5–15.5)
WBC: 7.4 K/uL (ref 4.0–10.5)
nRBC: 0 % (ref 0.0–0.2)

## 2023-11-19 LAB — RENAL FUNCTION PANEL
Albumin: 2 g/dL — ABNORMAL LOW (ref 3.5–5.0)
Anion gap: 10 (ref 5–15)
BUN: 58 mg/dL — ABNORMAL HIGH (ref 8–23)
CO2: 17 mmol/L — ABNORMAL LOW (ref 22–32)
Calcium: 8.5 mg/dL — ABNORMAL LOW (ref 8.9–10.3)
Chloride: 104 mmol/L (ref 98–111)
Creatinine, Ser: 2.35 mg/dL — ABNORMAL HIGH (ref 0.61–1.24)
GFR, Estimated: 30 mL/min — ABNORMAL LOW (ref >60)
Glucose, Bld: 156 mg/dL — ABNORMAL HIGH (ref 70–99)
Phosphorus: 4 mg/dL (ref 2.5–4.6)
Potassium: 5.3 mmol/L — ABNORMAL HIGH (ref 3.5–5.1)
Sodium: 131 mmol/L — ABNORMAL LOW (ref 135–145)

## 2023-11-19 MED ORDER — ROPIVACAINE HCL 5 MG/ML IJ SOLN
INTRAMUSCULAR | Status: DC | PRN
  Administered 2023-11-17: 20 mL via PERINEURAL
  Administered 2023-11-17: 15 mL via PERINEURAL

## 2023-11-19 MED ORDER — DEXAMETHASONE SODIUM PHOSPHATE 10 MG/ML IJ SOLN
INTRAMUSCULAR | Status: DC | PRN
Start: 2023-11-17 — End: 2023-11-19
  Administered 2023-11-17 (×2): 5 mg

## 2023-11-19 NOTE — Anesthesia Procedure Notes (Signed)
 Anesthesia Regional Block: Popliteal block   Pre-Anesthetic Checklist: , timeout performed,  Correct Patient, Correct Site, Correct Laterality,  Correct Procedure, Correct Position, site marked,  Risks and benefits discussed,  Pre-op evaluation,  At surgeon's request and post-op pain management  Laterality: Right  Prep: Maximum Sterile Barrier Precautions used, chloraprep       Needles:  Injection technique: Single-shot  Needle Type: Echogenic Stimulator Needle     Needle Length: 9cm  Needle Gauge: 21     Additional Needles:   Procedures:,,,, ultrasound used (permanent image in chart),,    Narrative:  Start time: 11/17/2023 9:03 AM End time: 11/17/2023 9:07 AM Injection made incrementally with aspirations every 5 mL. Anesthesiologist: Niels Marien CROME, MD

## 2023-11-19 NOTE — Anesthesia Procedure Notes (Signed)
 Anesthesia Regional Block: Adductor canal block   Pre-Anesthetic Checklist: , timeout performed,  Correct Patient, Correct Site, Correct Laterality,  Correct Procedure, Correct Position, site marked,  Risks and benefits discussed,  Pre-op evaluation,  At surgeon's request and post-op pain management  Laterality: Right  Prep: Maximum Sterile Barrier Precautions used, chloraprep       Needles:  Injection technique: Single-shot  Needle Type: Echogenic Stimulator Needle     Needle Length: 9cm  Needle Gauge: 21     Additional Needles:   Procedures:,,,, ultrasound used (permanent image in chart),,    Narrative:  Start time: 11/17/2023 9:08 AM End time: 11/17/2023 9:14 AM Injection made incrementally with aspirations every 5 mL. Anesthesiologist: Niels Marien CROME, MD

## 2023-11-19 NOTE — Plan of Care (Signed)

## 2023-11-19 NOTE — Anesthesia Postprocedure Evaluation (Signed)
 Anesthesia Post Note  Patient: Enio Hornback  Procedure(s) Performed: AMPUTATION BELOW KNEE (Right: Knee) APPLICATION, WOUND VAC (Right)     Patient location during evaluation: PACU Anesthesia Type: Regional and General Level of consciousness: awake and alert Pain management: pain level controlled Vital Signs Assessment: post-procedure vital signs reviewed and stable Respiratory status: spontaneous breathing, nonlabored ventilation, respiratory function stable and patient connected to nasal cannula oxygen Cardiovascular status: blood pressure returned to baseline and stable Postop Assessment: no apparent nausea or vomiting Anesthetic complications: no   No notable events documented.  Last Vitals:  Vitals:   11/19/23 0452 11/19/23 0820  BP: 102/63 105/66  Pulse: 92 88  Resp: 17 20  Temp: 37 C 36.7 C  SpO2: 99% 100%    Last Pain:  Vitals:   11/19/23 0452  TempSrc: Oral  PainSc:                  Tyray Proch L Jamaree Hosier

## 2023-11-19 NOTE — Progress Notes (Addendum)
 HD#16 SUBJECTIVE:  Patient Summary: Anush Dvonte Gatliff is a 63 y.o. with a pertinent PMH of End-stage HFrEF, CKD, CVA, bladder outlet obstruction s/p coude foley catheter during last admission 10/2023, and chronic osteomyelitis , admitted hypovolemic hyponatremia and AKI with coude foley and on CTX, improving  Overnight Events: NAE  Interim History: Pt seen and examined at the bedside this morning. He states that he is feeling pretty good and in no pain. Discussed discharge early next week to SNF and plan moving forward. All questions and concerns were addressed at this time.    OBJECTIVE:  Vital Signs: Vitals:   11/18/23 1700 11/18/23 2128 11/19/23 0452 11/19/23 0820  BP: (!) 93/59 106/73 102/63 105/66  Pulse: 83 (!) 104 92 88  Resp:  16 17 20   Temp: 98 F (36.7 C) 98.7 F (37.1 C) 98.6 F (37 C) 98 F (36.7 C)  TempSrc: Oral Oral Oral   SpO2: 100% 99% 99% 100%  Weight:   62.5 kg   Height:       Supplemental O2: Room Air SpO2: 100 % O2 Flow Rate (L/min): 5 L/min  Filed Weights   11/17/23 0758 11/18/23 0500 11/19/23 0452  Weight: 63.3 kg 61.9 kg 62.5 kg     Intake/Output Summary (Last 24 hours) at 11/19/2023 1050 Last data filed at 11/19/2023 0509 Gross per 24 hour  Intake 480 ml  Output 1670 ml  Net -1190 ml   Net IO Since Admission: -13,019.19 mL [11/19/23 1050]  Physical Exam: Const: Awake, alert in NAD HENT: Normocephalic, atraumatic, Card: RRR, No pitting edema on LE's bilaterally  Resp: LCTAB, No increased work of breathing Extremities: RLE BKA with would vac and rigid BKA protector in place.   Patient Lines/Drains/Airways Status     Active Line/Drains/Airways     Name Placement date Placement time Site Days   Peripheral IV 11/03/23 22 G 1.75 Left;Proximal Forearm 11/03/23  0945  Forearm  1   Peripheral IV 11/04/23 20 G 1 Posterior;Right Forearm 11/04/23  0845  Forearm  less than 1   Urethral Catheter Mekides Nida, RN Coude 16 Fr. 11/03/23  1034   Coude  1   Wound / Incision (Open or Dehisced) 09/01/23 Non-pressure wound;Amputation Foot Anterior;Right amputation of right big toe in february, reddened foot area with purulent drainage present 09/01/23  1721  Foot  64   Wound 11/02/23 1655 Vascular Ulcer Foot Anterior;Right 11/02/23  1655  Foot  2   Wound 11/02/23 1658 Vascular Ulcer Foot Right;Lateral 11/02/23  1658  Foot  2            Pertinent labs and imaging:      Latest Ref Rng & Units 11/19/2023    6:38 AM 11/18/2023    5:12 AM 11/16/2023    8:52 AM  CBC  WBC 4.0 - 10.5 K/uL 7.4  8.0  5.7   Hemoglobin 13.0 - 17.0 g/dL 9.0  9.3  8.7   Hematocrit 39.0 - 52.0 % 28.3  29.0  28.2   Platelets 150 - 400 K/uL 299  227  110        Latest Ref Rng & Units 11/19/2023    6:38 AM 11/18/2023    5:12 AM 11/17/2023    9:10 PM  CMP  Glucose 70 - 99 mg/dL 843  736  464   BUN 8 - 23 mg/dL 58  48  46   Creatinine 0.61 - 1.24 mg/dL 7.64  7.74  7.56   Sodium 135 -  145 mmol/L 131  131  128   Potassium 3.5 - 5.1 mmol/L 5.3  5.1  5.4   Chloride 98 - 111 mmol/L 104  102  101   CO2 22 - 32 mmol/L 17  19  20    Calcium  8.9 - 10.3 mg/dL 8.5  8.7  8.1    No results found.     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 This hospitalization it has continued to worsen. He was  not on medications prior to admission and has not wished to treat it in the past.  -POD 2 R BKA with Dr. Harden.  -Wound vac in place until follow up with Dr. Harden outpatient.  Elevated Protein gap  SPEP with Positive M-Spike SPEP with gamma globulin 2.1 and Mspike 1.1. Kappa/Lambda light chain ratio 0.58. IgG elevated at 2,664 with lamba specificity, UPEP bence jones protein positive.  -This could also be contributing to hyponatremia in form of a pseudohyponatremia.  -Dr. Autumn with hematology consulted and following -Bone marrow biopsy 7/17--awaiting results  Hyponatremia Likely multifactorial, including cardiorenal syndrome, AKI, bladder outlet obstruction, and hypovolemia. Pt continues to mildly improve with fluids, but this is not sustained. -Could potentially be due to pseudohyponatremia due to presence of monoclonal gammopathy.   Debility -Pt currently on hospital day 16 with decreased mobility and the vast majority of time spent in the bed. Will continue to encourage working with PT/OT and out of bed throughout the day.  -POD 2 R BKA. PT to eval today.  -PT/OT continue to recommend SNF at discharge to which the patient is agreeable.   AKI Bladder outlet obstruction with BL hydroureter UTI NAGMA Since admission, AKI has significantly improved. Developed post-ATN diuresis, Will keep coude catheter in place. Urine culture negative.  -Renal function likely impacted by monoclonal gammopathy of renal significance. Hematology following -Will reach out to urology for guidance managing catheter after discharge.  -Sodium Bicarb 1300 TID for persistent NAGMA. Bicarb 17 today  Biventricular HFrEF EF 20-25%.  Persistent Tacchycardia Hx of nonobstructive CAD. Nonadherent to medications. Not a candidate for advanced therapies. Suspicion of prior MI but none documented in the chart. Remains peripherally euvolemic. Pt has not had anymore episodes of tachypnea or orthopnea - Monitor electrolytes;  goal Mg 2, K ~4. - Monitor urine output. - Continue metoprolol  25mg  BID  T2DM -BG improved today -Continue SSI  Hx of LV thrombus (not visualized on TTE and MR 10/2023) New subscapular splenic infarct -Will resume Eliquis  5mg  BID today  Acute on chronic anemia -Low sat ratios during last hospitalization.  -S/p IV Iron  7/8. Will continue daily CBC's. Transfuse if Hbg <7  Encephalopathy Delirium Cefepime  neurotoxicity Resolved.   Sepsis Possible PNA -Resolved.   Multisystem organ failure Goals of care Palliative Consult Severe protein calorie malnutrition -  Discussed multisystem organ failure and recurrent hospitalizations -Palliative care saw pt and pt expressed desire to continue to be full code, full scope.  -At this time, he and his family wish to continue aggressive care. -Per PT eval, Pt to d/c to SNF  Best Practice: Diet: Renal diet IVF: Fluids: none VTE: Lovenox  Code: Full  Disposition planning: Therapy Recs: Pending, DME: other pending DISPO: Anticipated discharge pending PT eval pending clinical improvement and R BKA.  Signature:  Schuyler Novak, DO [facility]  10:50 AM, 11/19/2023  On Call pager [phone]

## 2023-11-20 ENCOUNTER — Encounter (HOSPITAL_COMMUNITY): Payer: Self-pay | Admitting: Orthopedic Surgery

## 2023-11-20 DIAGNOSIS — M86661 Other chronic osteomyelitis, right tibia and fibula: Secondary | ICD-10-CM | POA: Diagnosis not present

## 2023-11-20 LAB — HEPATIC FUNCTION PANEL
ALT: 18 U/L (ref 0–44)
AST: 18 U/L (ref 15–41)
Albumin: 2.1 g/dL — ABNORMAL LOW (ref 3.5–5.0)
Alkaline Phosphatase: 40 U/L (ref 38–126)
Bilirubin, Direct: <0.1 mg/dL (ref 0.0–0.2)
Total Bilirubin: 0.8 mg/dL (ref 0.0–1.2)
Total Protein: 7.2 g/dL (ref 6.5–8.1)

## 2023-11-20 LAB — RENAL FUNCTION PANEL
Albumin: 2.1 g/dL — ABNORMAL LOW (ref 3.5–5.0)
Anion gap: 10 (ref 5–15)
BUN: 59 mg/dL — ABNORMAL HIGH (ref 8–23)
CO2: 20 mmol/L — ABNORMAL LOW (ref 22–32)
Calcium: 8.6 mg/dL — ABNORMAL LOW (ref 8.9–10.3)
Chloride: 101 mmol/L (ref 98–111)
Creatinine, Ser: 2.3 mg/dL — ABNORMAL HIGH (ref 0.61–1.24)
GFR, Estimated: 31 mL/min — ABNORMAL LOW (ref >60)
Glucose, Bld: 184 mg/dL — ABNORMAL HIGH (ref 70–99)
Phosphorus: 4.2 mg/dL (ref 2.5–4.6)
Potassium: 5.6 mmol/L — ABNORMAL HIGH (ref 3.5–5.1)
Sodium: 131 mmol/L — ABNORMAL LOW (ref 135–145)

## 2023-11-20 LAB — CK: Total CK: 62 U/L (ref 49–397)

## 2023-11-20 LAB — CBC
HCT: 28.6 % — ABNORMAL LOW (ref 39.0–52.0)
Hemoglobin: 9 g/dL — ABNORMAL LOW (ref 13.0–17.0)
MCH: 27.4 pg (ref 26.0–34.0)
MCHC: 31.5 g/dL (ref 30.0–36.0)
MCV: 86.9 fL (ref 80.0–100.0)
Platelets: 332 K/uL (ref 150–400)
RBC: 3.29 MIL/uL — ABNORMAL LOW (ref 4.22–5.81)
RDW: 17.4 % — ABNORMAL HIGH (ref 11.5–15.5)
WBC: 6.7 K/uL (ref 4.0–10.5)
nRBC: 0 % (ref 0.0–0.2)

## 2023-11-20 LAB — BASIC METABOLIC PANEL WITH GFR
Anion gap: 9 (ref 5–15)
BUN: 58 mg/dL — ABNORMAL HIGH (ref 8–23)
CO2: 19 mmol/L — ABNORMAL LOW (ref 22–32)
Calcium: 8.4 mg/dL — ABNORMAL LOW (ref 8.9–10.3)
Chloride: 99 mmol/L (ref 98–111)
Creatinine, Ser: 2.21 mg/dL — ABNORMAL HIGH (ref 0.61–1.24)
GFR, Estimated: 32 mL/min — ABNORMAL LOW (ref >60)
Glucose, Bld: 118 mg/dL — ABNORMAL HIGH (ref 70–99)
Potassium: 5.5 mmol/L — ABNORMAL HIGH (ref 3.5–5.1)
Sodium: 127 mmol/L — ABNORMAL LOW (ref 135–145)

## 2023-11-20 LAB — GLUCOSE, CAPILLARY
Glucose-Capillary: 212 mg/dL — ABNORMAL HIGH (ref 70–99)
Glucose-Capillary: 288 mg/dL — ABNORMAL HIGH (ref 70–99)
Glucose-Capillary: 137 mg/dL — ABNORMAL HIGH (ref 70–99)
Glucose-Capillary: 190 mg/dL — ABNORMAL HIGH (ref 70–99)

## 2023-11-20 LAB — SURGICAL PATHOLOGY

## 2023-11-20 MED ORDER — SODIUM ZIRCONIUM CYCLOSILICATE 10 G PO PACK
10.0000 g | PACK | Freq: Once | ORAL | Status: AC
  Administered 2023-11-20: 10 g via ORAL
  Filled 2023-11-20: qty 1

## 2023-11-20 MED ORDER — INSULIN GLARGINE-YFGN 100 UNIT/ML ~~LOC~~ SOLN
10.0000 [IU] | Freq: Every day | SUBCUTANEOUS | Status: DC
  Administered 2023-11-20 – 2023-11-21 (×2): 10 [IU] via SUBCUTANEOUS
  Filled 2023-11-20 (×3): qty 0.1

## 2023-11-20 MED ORDER — CALCIUM GLUCONATE-NACL 1-0.675 GM/50ML-% IV SOLN
1.0000 g | Freq: Once | INTRAVENOUS | Status: AC
  Administered 2023-11-20: 1000 mg via INTRAVENOUS
  Filled 2023-11-20: qty 50

## 2023-11-20 NOTE — Progress Notes (Signed)
 Patient ID: Derek Thompson, male   DOB: 1960/09/10, 63 y.o.   MRN: [id number] Patient is postoperative day 3 right below-knee amputation.  There is no drainage in the wound VAC canister there is a good suction fit.  Patient states he has no pain.  Discharge planning based on therapy recommendations.

## 2023-11-20 NOTE — Inpatient Diabetes Management (Signed)
 Inpatient Diabetes Program Recommendations  AACE/ADA: New Consensus Statement on Inpatient Glycemic Control (2015)  Target Ranges:  Prepandial:   less than 140 mg/dL      Peak postprandial:   less than 180 mg/dL (1-2 hours)      Critically ill patients:  140 - 180 mg/dL   Lab Results  Component Value Date   GLUCAP 288 (H) 11/20/2023   HGBA1C 8.7 (H) 09/02/2023    Review of Glycemic Control  Latest Reference Range & Units 11/18/23 21:43 11/19/23 08:19 11/19/23 11:31 11/19/23 16:14 11/19/23 21:00 11/20/23 07:17 11/20/23 11:30  Glucose-Capillary 70 - 99 mg/dL 797 (H) 775 (H) 817 (H) 132 (H) 213 (H) 212 (H) 288 (H)  (H): Data is abnormally high Diabetes history: Type 2 DM Outpatient Diabetes medications: Farxiga  10 mg every day, Metformin  1000 mg QD Current orders for Inpatient glycemic control: Novolog  0-15 units TID & HS  Inpatient Diabetes Program Recommendations:    Consider adding Novolog  2 units TID (assuming patient is consuming >50% of meals)  Thanks, Tinnie Minus, MSN, RNC-OB Diabetes Coordinator [phone] (8a-5p)

## 2023-11-20 NOTE — Plan of Care (Signed)

## 2023-11-20 NOTE — Plan of Care (Signed)
  Problem: Health Behavior/Discharge Planning: Goal: Ability to manage health-related needs will improve Outcome: Progressing   Problem: Activity: Goal: Risk for activity intolerance will decrease Outcome: Progressing   Problem: Nutrition: Goal: Adequate nutrition will be maintained Outcome: Progressing   Problem: Pain Managment: Goal: General experience of comfort will improve and/or be controlled Outcome: Progressing   Problem: Safety: Goal: Ability to remain free from injury will improve Outcome: Progressing

## 2023-11-20 NOTE — Evaluation (Signed)
 Occupational Therapy Re-Evaluation Patient Details Name: Derek Thompson MRN: [id number] DOB: 1961/02/27 Today's Date: 11/20/2023   History of Present Illness   Pt is a 63 y/o M presenting to ED on 11/02/23 with lethargy. Pt found to be tachycardic, hyperkalemic, and hyponatremic. 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     Clinical Impressions Pt reevaluated s/p R BKA. Prior to admission, pt was homeless, reports walking with a RW and independence in self care. Presents with R LE post operative pain, fatigue, generalized weakness and poor standing balance. Pt needs supervision for bed mobility, CGA for sitting balance at EOB and moderate assistance to stand. He is unable to hop or pivot on L LE, transfers require Stedy. Pt needs set up to total assist for ADLs. Patient will benefit from continued inpatient follow up therapy, <3 hours/day.     If plan is discharge home, recommend the following:   A lot of help with walking and/or transfers;A lot of help with bathing/dressing/bathroom;Assistance with cooking/housework;Direct supervision/assist for medications management;Direct supervision/assist for financial management;Assist for transportation;Help with stairs or ramp for entrance     Functional Status Assessment         Equipment Recommendations   Other (comment) (defer)     Recommendations for Other Services         Precautions/Restrictions   Precautions Precautions: Fall;Other (comment) Recall of Precautions/Restrictions: Impaired Precaution/Restrictions Comments: R limb guard protector, wound vac Restrictions Weight Bearing Restrictions Per Provider Order: Yes RLE Weight Bearing Per Provider  Order: Non weight bearing     Mobility Bed Mobility Overal bed mobility: Needs Assistance Bed Mobility: Supine to Sit, Sit to Supine     Supine to sit: Contact guard Sit to supine: Contact guard assist   General bed mobility comments: CGA for safety, increased time, + use of rail    Transfers Overall transfer level: Needs assistance Equipment used: Ambulation equipment used Transfers: Sit to/from Stand Sit to Stand: Mod assist, +2 safety/equipment           General transfer comment: assist to rise and maintain balance in static standing Transfer via Lift Equipment: Stedy    Balance Overall balance assessment: Needs assistance Sitting-balance support: Feet supported, No upper extremity supported Sitting balance-Leahy Scale: Fair Sitting balance - Comments: CGA for safety   Standing balance support: Bilateral upper extremity supported Standing balance-Leahy Scale: Poor                             ADL either performed or assessed with clinical judgement   ADL Overall ADL's : Needs assistance/impaired Eating/Feeding: Independent;Sitting   Grooming: Wash/dry hands;Wash/dry face;Contact guard assist;Sitting   Upper Body Bathing: Minimal assistance;Sitting   Lower Body Bathing: Moderate assistance;Sitting/lateral leans   Upper Body Dressing : Contact guard assist;Sitting   Lower Body Dressing: Moderate assistance;Sitting/lateral leans       Toileting- Clothing Manipulation and Hygiene: Total assistance;Sit to/from stand               Vision         Perception         Praxis         Pertinent Vitals/Pain Pain Assessment Pain Assessment: Faces Faces Pain Scale: Hurts  little more Pain Location: R LE Pain Descriptors / Indicators: Discomfort, Grimacing, Guarding Pain Intervention(s): Monitored during session, Premedicated before session, Repositioned     Extremity/Trunk Assessment Upper Extremity Assessment Upper Extremity  Assessment: Generalized weakness   Lower Extremity Assessment Lower Extremity Assessment: Defer to PT evaluation RLE Deficits / Details: s/p BKA. 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, clinical functioning impairment (select all impairments): Problem solving, Sequencing, Initiation, Organization, Reasoning                   Following commands: Impaired Following commands impaired: Follows one step commands with increased time, Follows multi-step commands with increased time     Cueing  General Comments   Cueing Techniques: Verbal cues;Tactile cues;Gestural cues      Exercises     Shoulder Instructions      Home Living Family/patient expects to be discharged to:: Skilled nursing facility                                        Prior Functioning/Environment Prior Level of Function : Independent/Modified Independent                    OT Problem List:     OT Treatment/Interventions:        OT Goals(Current goals can be found in the care plan section)   Acute Rehab OT Goals OT Goal Formulation: With patient Time For Goal Achievement: 12/04/23 Potential to Achieve Goals: Good   OT Frequency:  Min 2X/week    Co-evaluation              AM-PAC OT 6 Clicks Daily Activity     Outcome Measure Help from another person eating meals?: None Help from another person taking care of personal grooming?: A Little Help from another person toileting, which includes using toliet, bedpan, or urinal?: Total Help from another person bathing (including washing, rinsing, drying)?: A Lot Help from another person to put on and taking off regular upper body clothing?: A Little Help from another person to put on and taking off regular lower  body clothing?: A Lot 6 Click Score: 15   End of Session    Activity Tolerance: Patient limited by fatigue Patient left: in bed;with call bell/phone within reach;with bed alarm set  OT Visit Diagnosis: Unsteadiness on feet (R26.81);Other abnormalities of gait and mobility (R26.89);Muscle weakness (generalized) (M62.81);Other symptoms and signs involving cognitive function;Pain Pain - Right/Left: Right Pain - part of body: Leg                Time: 1045-1100 OT Time Calculation (min): 15 min Charges:  OT General Charges $OT Visit: 1 Visit OT Evaluation $OT Re-eval: 1 Re-eval  Derek Thompson, OTR/L Acute Rehabilitation Services Office: [phone]   Kennth Derek Helling 11/20/2023, 12:08 PM

## 2023-11-20 NOTE — Progress Notes (Signed)
 HD#17 SUBJECTIVE:  Patient Summary: Derek Thompson is a 63 y.o. with a pertinent PMH of End-stage HFrEF, CKD, CVA, bladder outlet obstruction s/p coude foley catheter during last admission 10/2023, and chronic osteomyelitis , admitted hypovolemic hyponatremia and AKI with coude foley and on CTX, improving  Overnight Events: NAE  Interim History: Pt seen and examined sitting up in the chair this morning. He is endorsing pain in the RLE, which is new. He would like to get back in bed. He has no other questions or concerns at this time.    OBJECTIVE:  Vital Signs: Vitals:   11/19/23 1926 11/20/23 0351 11/20/23 0436 11/20/23 0744  BP: 97/63 97/69  115/74  Pulse: 92   (!) 102  Resp: 16 17    Temp: 97.9 F (36.6 C) 98.8 F (37.1 C)  98.2 F (36.8 C)  TempSrc: Oral   Oral  SpO2: 99% 100%  100%  Weight:   66.4 kg   Height:       Supplemental O2: Room Air SpO2: 100 % O2 Flow Rate (L/min): 5 L/min  Filed Weights   11/18/23 0500 11/19/23 0452 11/20/23 0436  Weight: 61.9 kg 62.5 kg 66.4 kg     Intake/Output Summary (Last 24 hours) at 11/20/2023 1133 Last data filed at 11/20/2023 0900 Gross per 24 hour  Intake 440 ml  Output 2230 ml  Net -1790 ml   Net IO Since Admission: -14,799.19 mL [11/20/23 1133]  Physical Exam: Const: Awake, alert in NAD HENT: Normocephalic, atraumatic, Card: RRR, No pitting edema on LE's bilaterally  Resp: LCTAB, No increased work of breathing Extremities: RLE BKA with would vac and rigid BKA protector in place.   Patient Lines/Drains/Airways Status     Active Line/Drains/Airways     Name Placement date Placement time Site Days   Peripheral IV 11/03/23 22 G 1.75 Left;Proximal Forearm 11/03/23  0945  Forearm  1   Peripheral IV 11/04/23 20 G 1 Posterior;Right Forearm 11/04/23  0845  Forearm  less than 1   Urethral Catheter Mekides Nida, RN Coude 16 Fr. 11/03/23  1034  Coude  1   Wound / Incision (Open or Dehisced) 09/01/23 Non-pressure  wound;Amputation Foot Anterior;Right amputation of right big toe in february, reddened foot area with purulent drainage present 09/01/23  1721  Foot  64   Wound 11/02/23 1655 Vascular Ulcer Foot Anterior;Right 11/02/23  1655  Foot  2   Wound 11/02/23 1658 Vascular Ulcer Foot Right;Lateral 11/02/23  1658  Foot  2            Pertinent labs and imaging:      Latest Ref Rng & Units 11/20/2023    5:41 AM 11/19/2023    6:38 AM 11/18/2023    5:12 AM  CBC  WBC 4.0 - 10.5 K/uL 6.7  7.4  8.0   Hemoglobin 13.0 - 17.0 g/dL 9.0  9.0  9.3   Hematocrit 39.0 - 52.0 % 28.6  28.3  29.0   Platelets 150 - 400 K/uL 332  299  227        Latest Ref Rng & Units 11/20/2023    5:41 AM 11/19/2023    6:38 AM 11/18/2023    5:12 AM  CMP  Glucose 70 - 99 mg/dL 815  843  736   BUN 8 - 23 mg/dL 59  58  48   Creatinine 0.61 - 1.24 mg/dL 7.69  7.64  7.74   Sodium 135 - 145 mmol/L 131  131  131   Potassium 3.5 - 5.1 mmol/L 5.6  5.3  5.1   Chloride 98 - 111 mmol/L 101  104  102   CO2 22 - 32 mmol/L 20  17  19    Calcium  8.9 - 10.3 mg/dL 8.6  8.5  8.7    No results found.     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 This hospitalization it has continued to worsen. He was not on medications prior to admission and has not wished to treat it  in the past.  -POD 3 R BKA with Dr. Harden.  -Wound vac in place until follow up with Dr. Harden outpatient. -Patient in pain with hyperkalemia this morning. Will order a CK and EKG. Dilaudid  PRN ordered.   Elevated Protein gap  SPEP with Positive M-Spike SPEP with gamma globulin 2.1 and Mspike 1.1. Kappa/Lambda light chain ratio 0.58. IgG elevated at 2,664 with lamba specificity, UPEP bence jones protein positive.  -This could also be contributing to hyponatremia in form of a pseudohyponatremia.  -Dr. Autumn with hematology consulted and following -Bone marrow biopsy 7/17--awaiting results  Hyponatremia Likely multifactorial, including cardiorenal syndrome, AKI, bladder outlet obstruction, and hypovolemia. Pt continues to mildly improve with fluids, but this is not sustained. -Could potentially be due to pseudohyponatremia due to presence of monoclonal gammopathy.   Debility -Pt currently on hospital day 16 with decreased mobility and the vast majority of time spent in the bed. Will continue to encourage working with PT/OT and out of bed throughout the day.  -POD 2 R BKA. PT to eval today.  -PT/OT continue to recommend SNF at discharge to which the patient is agreeable.   AKI Bladder outlet obstruction with BL hydroureter UTI NAGMA Since admission, AKI has significantly improved. Developed post-ATN diuresis, Will keep coude catheter in place. Urine culture negative.  -Renal function likely impacted by monoclonal gammopathy of renal significance. Hematology following -Will reach out to urology for guidance managing catheter after discharge.  -Sodium Bicarb 1300 TID for persistent NAGMA. Bicarb 17 today  Biventricular HFrEF EF 20-25%.  Persistent Tacchycardia Hx of nonobstructive CAD. Nonadherent to medications. Not a candidate for advanced therapies. Suspicion of prior MI but none documented in the chart. Remains peripherally euvolemic. Pt has not had anymore episodes of tachypnea or  orthopnea - Monitor electrolytes; goal Mg 2, K ~4. - Monitor urine output. - Continue metoprolol  25mg  BID  T2DM -BG improved today -Continue SSI  Hx of LV thrombus (not visualized on TTE and MR 10/2023) New subscapular splenic infarct -Will resume Eliquis  5mg  BID today  Acute on chronic anemia -Low sat ratios during last hospitalization.  -S/p IV Iron  7/8. Will continue daily CBC's. Transfuse if Hbg <7  Encephalopathy Delirium Cefepime  neurotoxicity Resolved.   Sepsis Possible PNA -Resolved.   Multisystem  organ failure Goals of care Palliative Consult Severe protein calorie malnutrition -Discussed multisystem organ failure and recurrent hospitalizations -Palliative care saw pt and pt expressed desire to continue to be full code, full scope.  -At this time, he and his family wish to continue aggressive care. -Per PT eval, Pt to d/c to SNF  Best Practice: Diet: Renal diet IVF: Fluids: none VTE: Lovenox  Code: Full  Disposition planning: Therapy Recs: Pending, DME: other pending DISPO: Anticipated discharge pending PT eval pending clinical improvement and R BKA.  Signature:  Schuyler Novak, DO [facility]  11:33 AM, 11/20/2023  On Call pager [phone]

## 2023-11-20 NOTE — TOC Progression Note (Addendum)
 Transition of Care [facility]) - Progression Note    Patient Details  Name: Derek Thompson MRN: [id number] Date of Birth: 1960/09/11  Transition of Care [facility]) CM/SW Contact  Eros Montour SHAUNNA [place], [name] Phone Number: 11/20/2023, 1:05 PM  Clinical Narrative:     CSW spoke with Jon who is covering for [name] admissions today. She agreed to start snf auth for pt though later notified CSW that when they called pt's BCBS, they were informed that his insurance expired on 06/17/23.   1250: CSW met with pt and pt's mother to ask about insurance. Pt explained that this should be his current insurance. He stated he is on short term disability through his work and that his BCBS plan through work should still be active.  Pt was at [facility] on 06/29/23 which would have been after 06/17/23. Jon is also the hospital liaison with [employer] so CSW requested she look into what insurance they used for that SNF admission.   Jon Fortis CSW back and explained pt's insurance actually expired at beginning of July. CSW called [employer] and confirmed that pt does not have an active policy with them at this time.   1440: CSW met with pt and his Mother to update. While updating, pt's son, Derek Thompson, entered room. He explained that they are working with pt's union rep to extend his short term disability to long term disability. It seems pt's BCBS plan expired when pt's short term disability ended in the last few weeks. Plan will be to obtain long term disability and get his BCBS plan reinstated. At that point, [name]  can request for SNF authorization.   Expected Discharge Plan: Skilled Nursing Facility Barriers to Discharge: Continued Medical Work up, English as a second language teacher  Expected Discharge Plan and Services In-house Referral: Clinical Social Work   Post Acute Care Choice: Skilled Nursing Facility Living arrangements for the past 2 months: Homeless Shelter                                        Social Determinants of Health (SDOH) Interventions SDOH Screenings   Food Insecurity: No Food Insecurity (11/02/2023)  Housing: High Risk (11/02/2023)  Transportation Needs: Unmet Transportation Needs (11/02/2023)  Utilities: Not At Risk (11/02/2023)  Alcohol Screen: Low Risk  (08/19/2022)  Depression (PHQ2-9): Low Risk  (10/11/2023)  Financial Resource Strain: Low Risk  (08/19/2022)  Tobacco Use: Medium Risk (11/17/2023)    Readmission Risk Interventions     No data to display

## 2023-11-21 DIAGNOSIS — M86661 Other chronic osteomyelitis, right tibia and fibula: Secondary | ICD-10-CM | POA: Diagnosis not present

## 2023-11-21 LAB — URINALYSIS, ROUTINE W REFLEX MICROSCOPIC
Bilirubin Urine: NEGATIVE
Glucose, UA: 150 mg/dL — AB
Ketones, ur: NEGATIVE mg/dL
Nitrite: NEGATIVE
Protein, ur: 100 mg/dL — AB
RBC / HPF: >50 RBC/hpf (ref 0–5)
Specific Gravity, Urine: 1.01 (ref 1.005–1.030)
WBC, UA: >50 WBC/hpf (ref 0–5)
pH: 6 (ref 5.0–8.0)

## 2023-11-21 LAB — CBC
HCT: 27.7 % — ABNORMAL LOW (ref 39.0–52.0)
Hemoglobin: 8.8 g/dL — ABNORMAL LOW (ref 13.0–17.0)
MCH: 27.4 pg (ref 26.0–34.0)
MCHC: 31.8 g/dL (ref 30.0–36.0)
MCV: 86.3 fL (ref 80.0–100.0)
Platelets: 320 K/uL (ref 150–400)
RBC: 3.21 MIL/uL — ABNORMAL LOW (ref 4.22–5.81)
RDW: 17.2 % — ABNORMAL HIGH (ref 11.5–15.5)
WBC: 5.8 K/uL (ref 4.0–10.5)
nRBC: 0 % (ref 0.0–0.2)

## 2023-11-21 LAB — RENAL FUNCTION PANEL
Albumin: 1.9 g/dL — ABNORMAL LOW (ref 3.5–5.0)
Anion gap: 11 (ref 5–15)
BUN: 55 mg/dL — ABNORMAL HIGH (ref 8–23)
CO2: 17 mmol/L — ABNORMAL LOW (ref 22–32)
Calcium: 8.4 mg/dL — ABNORMAL LOW (ref 8.9–10.3)
Chloride: 98 mmol/L (ref 98–111)
Creatinine, Ser: 2.18 mg/dL — ABNORMAL HIGH (ref 0.61–1.24)
GFR, Estimated: 33 mL/min — ABNORMAL LOW (ref >60)
Glucose, Bld: 233 mg/dL — ABNORMAL HIGH (ref 70–99)
Phosphorus: 4.2 mg/dL (ref 2.5–4.6)
Potassium: 4.5 mmol/L (ref 3.5–5.1)
Sodium: 126 mmol/L — ABNORMAL LOW (ref 135–145)

## 2023-11-21 LAB — GLUCOSE, CAPILLARY
Glucose-Capillary: 156 mg/dL — ABNORMAL HIGH (ref 70–99)
Glucose-Capillary: 365 mg/dL — ABNORMAL HIGH (ref 70–99)
Glucose-Capillary: 200 mg/dL — ABNORMAL HIGH (ref 70–99)
Glucose-Capillary: 172 mg/dL — ABNORMAL HIGH (ref 70–99)

## 2023-11-21 MED ORDER — SODIUM CHLORIDE 0.9 % IV SOLN
1.0000 g | INTRAVENOUS | Status: DC
  Administered 2023-11-21 – 2023-11-23 (×3): 1 g via INTRAVENOUS
  Filled 2023-11-21 (×3): qty 10

## 2023-11-21 MED ORDER — LIDOCAINE HCL URETHRAL/MUCOSAL 2 % EX GEL
1.0000 | Freq: Once | CUTANEOUS | Status: AC
  Administered 2023-11-21: 1 via URETHRAL
  Filled 2023-11-21: qty 6

## 2023-11-21 NOTE — Progress Notes (Signed)
   11/20/23 2050  Assess: MEWS Score  Temp (!) 101.3 F (38.5 C)  BP 97/60  MAP (mmHg) 72  Pulse Rate 94  ECG Heart Rate 94  Resp 20  SpO2 100 %  Assess: MEWS Score  MEWS Temp 1  MEWS Systolic 1  MEWS Pulse 0  MEWS RR 0  MEWS LOC 0  MEWS Score 2  MEWS Score Color Yellow  Assess: if the MEWS score is Yellow or Red  Were vital signs accurate and taken at a resting state? Yes  Does the patient meet 2 or more of the SIRS criteria? Yes  Does the patient have a confirmed or suspected source of infection? Yes  MEWS guidelines implemented  Yes, yellow  Treat  MEWS Interventions Considered administering scheduled or prn medications/treatments as ordered  Take Vital Signs  Increase Vital Sign Frequency  Yellow: Q2hr x1, continue Q4hrs until patient remains green for 12hrs  Escalate  MEWS: Escalate Yellow: Discuss with charge nurse and consider notifying provider and/or RRT  Notify: Charge Nurse/RN  Name of Charge Nurse/RN Notified Caron, RN  Provider Notification  Provider Name/Title N/A  Notify: Rapid Response  Name of Rapid Response RN Notified N/A  Assess: SIRS CRITERIA  SIRS Temperature  1  SIRS Respirations  0  SIRS Pulse 1  SIRS WBC 0  SIRS Score Sum  2

## 2023-11-21 NOTE — Progress Notes (Signed)
 Physical Therapy Treatment Patient Details Name: Derek Thompson MRN: [id number] DOB: 1960/12/24 Today's Date: 11/21/2023   History of Present Illness Pt is a 63 y/o M presenting to ED on 11/02/23 with lethargy. Pt found to be tachycardic, hyperkalemic, and hyponatremic. 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    PT Comments  Patient is agreeable to session. He continues to require assistance with bed mobility and +2 person assistance with transfers. Activity tolerance limited by fatigue and generalized weakness. Recommend to continue PT with rehabilitation < 3 hours/day recommended after this hospital stay.    If plan is discharge home, recommend the following: A lot of help with bathing/dressing/bathroom;Supervision due to cognitive status;Help with stairs or ramp for entrance;A lot of help with walking and/or transfers;Assistance with cooking/housework;Assist for transportation   Can travel by private vehicle     No  Equipment Recommendations  Rolling walker (2 wheels);BSC/3in1;Wheelchair (measurements PT)    Recommendations for Other Services       Precautions / Restrictions Precautions Precautions: Fall;Other (comment) Recall of Precautions/Restrictions: Impaired Precaution/Restrictions Comments: R limb guard protector, wound vac Restrictions Weight Bearing Restrictions Per Provider Order: Yes RLE Weight Bearing Per Provider Order: Non weight bearing     Mobility  Bed Mobility Overal bed mobility: Needs Assistance Bed Mobility: Supine to Sit     Supine to sit: Min assist     General bed mobility comments: assistance for trunk support to sit upright. increased time and cues for sequencing and initiation     Transfers Overall transfer level: Needs assistance Equipment used: 2 person hand held assist Transfers: Sit to/from Stand, Bed to chair/wheelchair/BSC Sit to Stand: Mod assist, +2 physical assistance Stand pivot transfers: Mod assist, +2 physical assistance         General transfer comment: patient able to complete partial stand for ~ 10 seconds for pericare following bowel movement. cues for technique to facilitate independence with transfers, anterior weight shifting. nurse present throughout and assisted with transfer.    Ambulation/Gait               General Gait Details: unable to at this time   Stairs             Wheelchair Mobility     Tilt Bed    Modified Rankin (Stroke Patients Only)       Balance Overall balance assessment: Needs assistance Sitting-balance support: Feet supported, No upper extremity supported Sitting balance-Leahy Scale: Fair     Standing balance support: Bilateral upper extremity supported Standing balance-Leahy Scale: Poor Standing balance comment: external support required                            Communication Communication Communication: No apparent difficulties  Cognition Arousal: Alert Behavior During Therapy: WFL for tasks assessed/performed   PT - Cognitive impairments: Initiation, Problem solving, Sequencing, Attention                         Following commands: Impaired Following commands impaired: Follows one step commands with increased time    Cueing Cueing Techniques: Verbal cues, Tactile cues  Exercises      General Comments General comments (skin integrity, edema, etc.):  limb protector donned throughout session      Pertinent Vitals/Pain Pain Assessment Pain Assessment: No/denies pain    Home Living                          Prior Function            PT Goals (current goals can now be found in the care plan section) Acute Rehab PT Goals Patient Stated  Goal: to be more independent PT Goal Formulation: With patient Time For Goal Achievement: 12/02/23 Potential to Achieve Goals: Good Progress towards PT goals: Progressing toward goals    Frequency    Min 2X/week      PT Plan      Co-evaluation              AM-PAC PT 6 Clicks Mobility   Outcome Measure  Help needed turning from your back to your side while in a flat bed without using bedrails?: A Little Help needed moving from lying on your back to sitting on the side of a flat bed without using bedrails?: A Little Help needed moving to and from a bed to a chair (including a wheelchair)?: A Lot Help needed standing up from a chair using your arms (e.g., wheelchair or bedside chair)?: A Lot Help needed to walk in hospital room?: Total Help needed climbing 3-5 steps with a railing? : Total 6 Click Score: 12    End of Session   Activity Tolerance: Patient limited by fatigue Patient left: in chair;with call bell/phone within reach;with nursing/sitter in room Nurse Communication: Mobility status PT Visit Diagnosis: Unsteadiness on feet (R26.81);Difficulty in walking, not elsewhere classified (R26.2);Other abnormalities of gait and mobility (R26.89)     Time: 9095-9079 PT Time Calculation (min) (ACUTE ONLY): 16 min  Charges:    $Therapeutic Activity: 8-22 mins PT General Charges $$ ACUTE PT VISIT: 1 Visit                    [name], PT, MPT    [name] 11/21/2023, 12:21 PM

## 2023-11-21 NOTE — Progress Notes (Signed)
 HD#[id number] SUBJECTIVE:  Patient Summary: Derek Thompson is a 63 y.o. with a pertinent PMH of End-stage HFrEF, CKD, CVA, bladder outlet obstruction s/p coude foley catheter during last admission 10/2023, and chronic osteomyelitis , admitted hypovolemic hyponatremia and AKI with coude foley and on CTX, improving  Overnight Events: NAE  Interim History: Pt seen and examined sitting up in the chair this morning. He states that he is feeling pretty good overall and does not endorse any RLE pain. He was experiencing urinary pain last night but is feeling better today. All questions and concerns were addressed at this time.    OBJECTIVE:  Vital Signs: Vitals:   11/20/23 2050 11/20/23 2311 11/21/23 0454 11/21/23 0707  BP: 97/60 (!) 88/54 106/75 (!) 100/58  Pulse: 94 85 97 92  Resp: 20 20 20 20   Temp: (!) 101.3 F (38.5 C) 98.7 F (37.1 C) 97.7 F (36.5 C) 99.5 F (37.5 C)  TempSrc: Oral Oral Oral Oral  SpO2: 100% 100% 100% 100%  Weight:      Height:       Supplemental O2: Room Air   Filed Weights   11/18/23 0500 11/19/23 0452 11/20/23 0436  Weight: 61.9 kg 62.5 kg 66.4 kg     Intake/Output Summary (Last 24 hours) at 11/21/2023 1223 Last data filed at 11/21/2023 0454 Gross per 24 hour  Intake 120 ml  Output 1150 ml  Net -1030 ml   Net IO Since Admission: -15,829.19 mL [11/21/23 1223]  Physical Exam: Const: Awake, alert in NAD HENT: Normocephalic, atraumatic, Card: RRR, No pitting edema on LE's bilaterally  Resp: LCTAB, No increased work of breathing Abd: NTND, No suprapubic tenderness noted.  Extremities: RLE BKA with would vac and rigid BKA protector in place.   Patient Lines/Drains/Airways Status     Active Line/Drains/Airways     Name Placement date Placement time Site Days   Peripheral IV 11/03/23 22 G 1.75 Left;Proximal Forearm 11/03/23  0945  Forearm  1   Peripheral IV 11/04/23 20 G 1 Posterior;Right Forearm 11/04/23  0845  Forearm  less than 1   Urethral  Catheter Mekides Nida, RN Coude 16 Fr. 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, reddened foot area with purulent drainage present 09/01/23  1721  Foot  63   Wound 11/02/23 1655 Vascular Ulcer Foot Anterior;Right 11/02/23  1655  Foot  2   Wound 11/02/23 1658 Vascular Ulcer Foot Right;Lateral 11/02/23  1658  Foot  2            Pertinent labs and imaging:      Latest Ref Rng & Units 11/21/2023    8:23 AM 11/20/2023    5:41 AM 11/19/2023    6:38 AM  CBC  WBC 4.0 - 10.5 K/uL 5.8  6.7  7.4   Hemoglobin 13.0 - 17.0 g/dL 8.8  9.0  9.0   Hematocrit 39.0 - 52.0 % 27.7  28.6  28.3   Platelets 150 - 400 K/uL 320  332  299        Latest Ref Rng & Units 11/21/2023    8:23 AM 11/20/2023    6:13 PM 11/20/2023    5:41 AM  CMP  Glucose 70 - 99 mg/dL 766  881  815   BUN 8 - 23 mg/dL 55  58  59   Creatinine 0.61 - 1.24 mg/dL 7.81  7.78  2.30   Sodium 135 - 145 mmol/L 126  127  131   Potassium 3.5 - 5.1 mmol/L 4.5  5.5  5.6   Chloride 98 - 111 mmol/L 98  99  101   CO2 22 - 32 mmol/L 17  19  20    Calcium  8.9 - 10.3 mg/dL 8.4  8.4  8.6   Total Protein 6.5 - 8.1 g/dL   7.2   Total Bilirubin 0.0 - 1.2 mg/dL   0.8   Alkaline Phos 38 - 126 U/L   40   AST 15 - 41 U/L   18   ALT 0 - 44 U/L   18    No results found.     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 This hospitalization it has continued to worsen. He was not on medications prior to admission and has not wished to treat it in the past.  -POD 3 R BKA with Dr. Harden.  -Wound vac in place until follow up with Dr. Harden outpatient. -Pt experienced pain yesterday, does not endorse pain today. PRN's ordered if needed.  Febrile  Pt became febrile last night with temperature 101.3.Tylenol  given with resolution of fever. There was blood noted in coude catheter back yesterday, so suspect possible UTI. Will order UA and remove and replace coude catheter.   Elevated Protein gap  SPEP with Positive M-Spike SPEP with gamma globulin 2.1 and Mspike 1.1. Kappa/Lambda light chain ratio 0.58. IgG elevated at 2,664 with lamba specificity, UPEP bence jones protein positive.  -This could also be contributing to hyponatremia in form of a pseudohyponatremia.  -Dr. Autumn with hematology consulted and following -Bone marrow biopsy 7/17--awaiting results  Hyponatremia Likely multifactorial, including cardiorenal syndrome, AKI, bladder outlet obstruction, and hypovolemia. Pt continues to mildly improve with fluids, but this is not sustained.  -Could potentially be due to pseudohyponatremia due to presence of monoclonal gammopathy.   Debility -Pt currently on hospital day 16 with decreased mobility and the vast majority of time spent in the bed. Will continue to encourage working with PT/OT and out of bed throughout the day.  -POD 3 R BKA.  -PT/OT continue to recommend SNF at discharge to which the patient is agreeable.   AKI Bladder outlet obstruction with BL hydroureter UTI NAGMA Since admission, AKI has significantly improved. Developed post-ATN diuresis, Will keep coude catheter in place. Urine culture negative.  -Renal function likely impacted by  monoclonal gammopathy of renal significance. Hematology following -Sodium Bicarb 1300 TID for persistent NAGMA. Bicarb 17 today -Per urology, coude catheter to remain in place for 30 days and pt is to follow up outpatient.  -Due to blood in urine bag and spike in temp, will obtain UTI and change coude catheter.   Biventricular HFrEF EF 20-25%.  Persistent  Tacchycardia Hx of nonobstructive CAD. Nonadherent to medications. Not a candidate for advanced therapies. Suspicion of prior MI but none documented in the chart. Remains peripherally euvolemic. Pt has not had anymore episodes of tachypnea or orthopnea - Monitor electrolytes; goal Mg 2, K ~4. - Monitor urine output. - Continue metoprolol  25mg  BID  T2DM -BG improved today -Continue SSI and 10 units Semglee   Hx of LV thrombus (not visualized on TTE and MR 10/2023) New subscapular splenic infarct -Will resume Eliquis  5mg  BID today  Acute on chronic anemia -Low sat ratios during last hospitalization.  -S/p IV Iron  7/8. Will continue daily CBC's. Transfuse if Hbg <7  Encephalopathy Delirium Cefepime  neurotoxicity Resolved.   Sepsis Possible PNA -Resolved.   Multisystem organ failure Goals of care Palliative Consult Severe protein calorie malnutrition -Discussed multisystem organ failure and recurrent hospitalizations -Palliative care saw pt and pt expressed desire to continue to be full code, full scope.  -At this time, he and his family wish to continue aggressive care. -Per PT eval, Pt to d/c to SNF  Best Practice: Diet: Renal diet IVF: Fluids: none VTE: Lovenox  Code: Full  Disposition planning: Therapy Recs: Pending, DME: other pending DISPO: Anticipated discharge pending PT eval pending clinical improvement and R BKA.  Signature:  Schuyler Novak, DO [facility]  12:23 PM, 11/21/2023  On Call pager [phone]

## 2023-11-21 NOTE — Plan of Care (Signed)
  Problem: Health Behavior/Discharge Planning: Goal: Ability to manage health-related needs will improve Outcome: Progressing   Problem: Clinical Measurements: Goal: Ability to maintain clinical measurements within normal limits will improve Outcome: Progressing Goal: Will remain free from infection Outcome: Progressing Goal: Diagnostic test results will improve Outcome: Progressing Goal: Respiratory complications will improve Outcome: Progressing Goal: Cardiovascular complication will be avoided Outcome: Progressing   Problem: Activity: Goal: Risk for activity intolerance will decrease Outcome: Progressing   Problem: Nutrition: Goal: Adequate nutrition will be maintained Outcome: Progressing   Problem: Coping: Goal: Level of anxiety will decrease Outcome: Progressing   Problem: Elimination: Goal: Will not experience complications related to bowel motility Outcome: Progressing Goal: Will not experience complications related to urinary retention Outcome: Progressing   Problem: Pain Managment: Goal: General experience of comfort will improve and/or be controlled Outcome: Progressing   Problem: Safety: Goal: Ability to remain free from injury will improve Outcome: Progressing   Problem: Skin Integrity: Goal: Risk for impaired skin integrity will decrease Outcome: Progressing   Problem: Education: Goal: Ability to describe self-care measures that may prevent or decrease complications (Diabetes Survival Skills Education) will improve Outcome: Progressing Goal: Individualized Educational Video(s) Outcome: Progressing   Problem: Health Behavior/Discharge Planning: Goal: Ability to identify and utilize available resources and services will improve Outcome: Progressing Goal: Ability to manage health-related needs will improve Outcome: Progressing   Problem: Metabolic: Goal: Ability to maintain appropriate glucose levels will improve Outcome: Progressing   Problem:  Nutritional: Goal: Maintenance of adequate nutrition will improve Outcome: Progressing Goal: Progress toward achieving an optimal weight will improve Outcome: Progressing   Problem: Skin Integrity: Goal: Risk for impaired skin integrity will decrease Outcome: Progressing   Problem: Tissue Perfusion: Goal: Adequacy of tissue perfusion will improve Outcome: Progressing

## 2023-11-22 DIAGNOSIS — M86661 Other chronic osteomyelitis, right tibia and fibula: Secondary | ICD-10-CM | POA: Diagnosis not present

## 2023-11-22 LAB — GLUCOSE, CAPILLARY
Glucose-Capillary: 169 mg/dL — ABNORMAL HIGH (ref 70–99)
Glucose-Capillary: 305 mg/dL — ABNORMAL HIGH (ref 70–99)
Glucose-Capillary: 108 mg/dL — ABNORMAL HIGH (ref 70–99)
Glucose-Capillary: 139 mg/dL — ABNORMAL HIGH (ref 70–99)

## 2023-11-22 LAB — CBC WITH DIFFERENTIAL/PLATELET
Abs Immature Granulocytes: 0.02 K/uL (ref 0.00–0.07)
Basophils Absolute: 0 K/uL (ref 0.0–0.1)
Basophils Relative: 0 %
Eosinophils Absolute: 0 K/uL (ref 0.0–0.5)
Eosinophils Relative: 1 %
HCT: 28.4 % — ABNORMAL LOW (ref 39.0–52.0)
Hemoglobin: 8.9 g/dL — ABNORMAL LOW (ref 13.0–17.0)
Immature Granulocytes: 0 %
Lymphocytes Relative: 24 %
Lymphs Abs: 1.5 K/uL (ref 0.7–4.0)
MCH: 27.1 pg (ref 26.0–34.0)
MCHC: 31.3 g/dL (ref 30.0–36.0)
MCV: 86.6 fL (ref 80.0–100.0)
Monocytes Absolute: 0.6 K/uL (ref 0.1–1.0)
Monocytes Relative: 10 %
Neutro Abs: 3.9 K/uL (ref 1.7–7.7)
Neutrophils Relative %: 65 %
Platelets: 384 K/uL (ref 150–400)
RBC: 3.28 MIL/uL — ABNORMAL LOW (ref 4.22–5.81)
RDW: 17.2 % — ABNORMAL HIGH (ref 11.5–15.5)
WBC: 6 K/uL (ref 4.0–10.5)
nRBC: 0 % (ref 0.0–0.2)

## 2023-11-22 LAB — BASIC METABOLIC PANEL WITH GFR
Anion gap: 11 (ref 5–15)
BUN: 56 mg/dL — ABNORMAL HIGH (ref 8–23)
CO2: 20 mmol/L — ABNORMAL LOW (ref 22–32)
Calcium: 8.7 mg/dL — ABNORMAL LOW (ref 8.9–10.3)
Chloride: 98 mmol/L (ref 98–111)
Creatinine, Ser: 2.24 mg/dL — ABNORMAL HIGH (ref 0.61–1.24)
GFR, Estimated: 32 mL/min — ABNORMAL LOW (ref >60)
Glucose, Bld: 203 mg/dL — ABNORMAL HIGH (ref 70–99)
Potassium: 5.1 mmol/L (ref 3.5–5.1)
Sodium: 129 mmol/L — ABNORMAL LOW (ref 135–145)

## 2023-11-22 LAB — SURGICAL PATHOLOGY

## 2023-11-22 MED ORDER — INSULIN GLARGINE-YFGN 100 UNIT/ML ~~LOC~~ SOLN
15.0000 [IU] | Freq: Every day | SUBCUTANEOUS | Status: DC
  Administered 2023-11-22 – 2023-12-08 (×17): 15 [IU] via SUBCUTANEOUS
  Filled 2023-11-22 (×18): qty 0.15

## 2023-11-22 MED ORDER — INSULIN ASPART 100 UNIT/ML IJ SOLN
3.0000 [IU] | Freq: Three times a day (TID) | INTRAMUSCULAR | Status: DC
  Administered 2023-11-22 – 2023-12-10 (×34): 3 [IU] via SUBCUTANEOUS

## 2023-11-22 NOTE — Plan of Care (Signed)
  Problem: Health Behavior/Discharge Planning: Goal: Ability to manage health-related needs will improve Outcome: Progressing   Problem: Clinical Measurements: Goal: Ability to maintain clinical measurements within normal limits will improve Outcome: Progressing Goal: Will remain free from infection Outcome: Progressing Goal: Diagnostic test results will improve Outcome: Progressing Goal: Respiratory complications will improve Outcome: Progressing Goal: Cardiovascular complication will be avoided Outcome: Progressing   Problem: Activity: Goal: Risk for activity intolerance will decrease Outcome: Progressing   Problem: Nutrition: Goal: Adequate nutrition will be maintained Outcome: Progressing   Problem: Coping: Goal: Level of anxiety will decrease Outcome: Progressing   Problem: Elimination: Goal: Will not experience complications related to bowel motility Outcome: Progressing Goal: Will not experience complications related to urinary retention Outcome: Progressing   Problem: Pain Managment: Goal: General experience of comfort will improve and/or be controlled Outcome: Progressing   Problem: Safety: Goal: Ability to remain free from injury will improve Outcome: Progressing   Problem: Skin Integrity: Goal: Risk for impaired skin integrity will decrease Outcome: Progressing   Problem: Education: Goal: Ability to describe self-care measures that may prevent or decrease complications (Diabetes Survival Skills Education) will improve Outcome: Progressing Goal: Individualized Educational Video(s) Outcome: Progressing   Problem: Coping: Goal: Ability to adjust to condition or change in health will improve Outcome: Progressing   Problem: Fluid Volume: Goal: Ability to maintain a balanced intake and output will improve Outcome: Progressing   Problem: Metabolic: Goal: Ability to maintain appropriate glucose levels will improve Outcome: Progressing   Problem:  Nutritional: Goal: Maintenance of adequate nutrition will improve Outcome: Progressing Goal: Progress toward achieving an optimal weight will improve Outcome: Progressing   Problem: Skin Integrity: Goal: Risk for impaired skin integrity will decrease Outcome: Progressing   Problem: Tissue Perfusion: Goal: Adequacy of tissue perfusion will improve Outcome: Progressing

## 2023-11-22 NOTE — Progress Notes (Signed)
 HD#19 SUBJECTIVE:  Patient Summary: Powell Haywood Meinders is a 63 y.o. with a pertinent PMH of End-stage HFrEF, CKD, CVA, bladder outlet obstruction s/p coude foley catheter during last admission 10/2023, and chronic osteomyelitis , admitted hypovolemic hyponatremia and AKI with coude foley and on CTX, improving  Overnight Events: NAE  Interim History: Pt seen and examined at the bedside this morning. He is feeling well overall and denies pain at the surgical site and lower abdomen. All questions and concerns were addressed at this time.    OBJECTIVE:  Vital Signs: Vitals:   11/21/23 2008 11/21/23 2200 11/22/23 0539 11/22/23 0736  BP: 107/63 106/80 103/82 121/81  Pulse: (!) 112 (!) 106 97 97  Resp: 20 18 16    Temp: (!) 100.8 F (38.2 C) 98.9 F (37.2 C) 98.3 F (36.8 C) 98 F (36.7 C)  TempSrc: Oral Oral Oral Oral  SpO2: 99% 98% 100% 100%  Weight:      Height:       Supplemental O2: Room Air   Filed Weights   11/18/23 0500 11/19/23 0452 11/20/23 0436  Weight: 61.9 kg 62.5 kg 66.4 kg     Intake/Output Summary (Last 24 hours) at 11/22/2023 1501 Last data filed at 11/22/2023 1100 Gross per 24 hour  Intake 460 ml  Output 1200 ml  Net -740 ml   Net IO Since Admission: -16,569.19 mL [11/22/23 1501]  Physical Exam: Const: Awake, alert in NAD HENT: Normocephalic, atraumatic, Card: RRR, No pitting edema on LE's bilaterally  Resp: LCTAB, No increased work of breathing Abd: NTND, No suprapubic tenderness noted.  Extremities: RLE BKA with would vac and rigid BKA protector in place.   Patient Lines/Drains/Airways Status     Active Line/Drains/Airways     Name Placement date Placement time Site Days   Peripheral IV 11/03/23 22 G 1.75 Left;Proximal Forearm 11/03/23  0945  Forearm  1   Peripheral IV 11/04/23 20 G 1 Posterior;Right Forearm 11/04/23  0845  Forearm  less than 1   Urethral Catheter Mekides Nida, RN Coude 16 Fr. 11/03/23  1034  Coude  1   Wound / Incision  (Open or Dehisced) 09/01/23 Non-pressure wound;Amputation Foot Anterior;Right amputation of right big toe in february, reddened foot area with purulent drainage present 09/01/23  1721  Foot  64   Wound 11/02/23 1655 Vascular Ulcer Foot Anterior;Right 11/02/23  1655  Foot  2   Wound 11/02/23 1658 Vascular Ulcer Foot Right;Lateral 11/02/23  1658  Foot  2            Pertinent labs and imaging:      Latest Ref Rng & Units 11/22/2023    4:22 AM 11/21/2023    8:23 AM 11/20/2023    5:41 AM  CBC  WBC 4.0 - 10.5 K/uL 6.0  5.8  6.7   Hemoglobin 13.0 - 17.0 g/dL 8.9  8.8  9.0   Hematocrit 39.0 - 52.0 % 28.4  27.7  28.6   Platelets 150 - 400 K/uL 384  320  332        Latest Ref Rng & Units 11/22/2023    4:22 AM 11/21/2023    8:23 AM 11/20/2023    6:13 PM  CMP  Glucose 70 - 99 mg/dL 796  766  881   BUN 8 - 23 mg/dL 56  55  58   Creatinine 0.61 - 1.24 mg/dL 7.75  7.81  7.78   Sodium 135 - 145 mmol/L 129  126  127  Potassium 3.5 - 5.1 mmol/L 5.1  4.5  5.5   Chloride 98 - 111 mmol/L 98  98  99   CO2 22 - 32 mmol/L 20  17  19    Calcium  8.9 - 10.3 mg/dL 8.7  8.4  8.4    No results found.     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 This hospitalization it has continued to worsen. He was not on medications prior to  admission and has not wished to treat it in the past.  -POD 3 R BKA with Dr. Harden.  -Wound vac in place until follow up with Dr. Harden outpatient. -Pt denies pain at surgical site. Although pt febrile, low suspicion for infection at surgical site.   Febrile  CAUTI Pt became febrile last night with temperature 100.8. Has been afebrile since. -Although pt febrile and tachycardic, due to persistent tachycardia since admission and lack of leukocytosis, do not feel that patient meets SIRS criteria.  -Likely CAUTI induced, urine culture pending. Rocephin  day 2/7 -Coude catheter changed yesterday. May keep in for 30 days per urology  Elevated Protein gap  SPEP with Positive M-Spike SPEP with gamma globulin 2.1 and Mspike 1.1. Kappa/Lambda light chain ratio 0.58. IgG elevated at 2,664 with lamba specificity, UPEP bence jones protein positive.  -This could also be contributing to hyponatremia in form of a pseudohyponatremia.  -Dr. Autumn with hematology consulted and following -Bone marrow biopsy 7/17--awaiting immunohistochemistry for further classification  Hyponatremia Likely multifactorial, including cardiorenal syndrome, AKI, bladder outlet obstruction, and hypovolemia. Pt continues to mildly improve with fluids, but this is not sustained.  -Could potentially be due to pseudohyponatremia due to presence of monoclonal gammopathy.   Debility -Pt currently on hospital day 16 with decreased mobility and the vast majority of time spent in the bed. Will continue to encourage working with PT/OT and out of bed throughout the day.  -POD 5 R BKA.  -PT/OT continue to recommend SNF at discharge to which the patient is agreeable.   AKI Bladder outlet obstruction with BL hydroureter NAGMA Since admission, AKI has significantly improved. Developed post-ATN diuresis, Will keep coude catheter in place. Urine culture negative.  -Renal function likely impacted by monoclonal gammopathy of renal significance.  Hematology following -Sodium Bicarb 1300 TID for persistent NAGMA. Bicarb 17 today -Per urology, coude catheter to remain in place for 30 days and pt is to follow up outpatient.   Biventricular HFrEF EF 20-25%.  Persistent Tacchycardia Hx of nonobstructive CAD. Nonadherent to medications. Not a candidate for advanced therapies. Suspicion of prior MI but none documented in the chart. Remains peripherally euvolemic. Pt has not had anymore episodes of tachypnea or orthopnea - Monitor electrolytes; goal Mg 2, K ~4. - Monitor urine output. - Continue metoprolol  25mg  BID  T2DM -BG spike  last PM to 365, will adjust semglee  to 15 units -Continue SSI  Hx of LV thrombus (not visualized on TTE and MR 10/2023) New subscapular splenic infarct -Contine Eliquis  5mg  BID  Acute on chronic anemia -Low sat ratios during last hospitalization.  -S/p IV Iron  7/8. Will continue daily CBC's. Transfuse if Hbg <7  Encephalopathy Delirium Cefepime  neurotoxicity Resolved.   Sepsis Possible PNA -Resolved.   Multisystem organ failure Goals of care Palliative Consult Severe protein calorie malnutrition -Discussed multisystem organ failure and recurrent hospitalizations -Palliative care saw pt and pt expressed desire to continue to be full code, full scope.  -At this time, he and his family wish to continue aggressive care. -Per PT eval, Pt to d/c to SNF  Best Practice: Diet: Renal diet IVF: Fluids: none VTE: Lovenox  Code: Full  Disposition planning: Therapy Recs: Pending, DME: other pending DISPO: Anticipated discharge pending PT eval pending clinical improvement and R BKA.  Signature:  Schuyler Novak, DO [facility]  3:01 PM, 11/22/2023  On Call pager [phone]

## 2023-11-22 NOTE — Progress Notes (Signed)
 Occupational Therapy Treatment Patient Details Name: Derek Thompson MRN: [id number] DOB: Jun 27, 1960 Today's Date: 11/22/2023   History of present illness Pt is a 63 y/o M presenting to ED on 11/02/23 with lethargy. Pt found to be tachycardic, hyperkalemic, and hyponatremic. 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   OT comments  Pt just awakened from nap. Reports pain prior to sleeping, none currently. Min assist to raise trunk to EOB. Demonstrated fair sitting balance. Worked on simulated lateral transfers along EOB with moderate assistance. Pt declined transfer to drop arm recliner citing discomfort in chair. Asked unit secretary to order cushion for chair. Pt declined practicing ADLs, instructed in LB bathing and dressing by leaning side to side. Patient will benefit from continued inpatient follow up therapy, <3 hours/day.      If plan is discharge home, recommend the following:  A lot of help with bathing/dressing/bathroom;Assistance with cooking/housework;Direct supervision/assist for medications management;Direct supervision/assist for financial management;Assist for transportation;Help with stairs or ramp for entrance;Two people to help with walking and/or transfers   Equipment Recommendations  Other (comment) (defer)    Recommendations for Other Services      Precautions / Restrictions Precautions Precautions: Fall;Other (comment) Recall of Precautions/Restrictions: Impaired Precaution/Restrictions Comments: R limb guard protector, wound vac Restrictions Weight Bearing Restrictions Per Provider Order: Yes RLE Weight Bearing Per Provider Order: Non weight bearing       Mobility Bed Mobility Overal bed  mobility: Needs Assistance Bed Mobility: Supine to Sit, Sit to Supine     Supine to sit: Min assist Sit to supine: Contact guard assist   General bed mobility comments: assistance for trunk support to sit upright. increased time and cues for sequencing and initiation    Transfers Overall transfer level: Needs assistance                Lateral/Scoot Transfers: Mod assist General transfer comment: moderate assistance using the bed pad to laterally scoot along EOB, demonstrated transfer to drop arm chair, pt declined stating the chair hurt his bottom, ordered cushion for chair through unit secretary     Balance Overall balance assessment: Needs assistance   Sitting balance-Leahy Scale: Fair Sitting balance - Comments: CGA for safety                                   ADL either performed or assessed with clinical judgement   ADL Overall ADL's : Needs assistance/impaired Eating/Feeding: Supervision/ safety;Sitting Eating/Feeding Details (indicate cue type and reason): at EOB                                   General ADL Comments: pt declined any ADL training, drank water  seated at EOB and agreed to work on simulated lateral scoot transfers at [facility]    Extremity/Trunk Assessment              Vision       Perception     Praxis     Communication Communication Communication: No apparent difficulties   Cognition Arousal: Alert Behavior During Therapy: WFL for tasks assessed/performed Cognition: Cognition impaired   Orientation impairments:  Time Awareness: Intellectual awareness impaired, Online awareness impaired Memory impairment (select all impairments): Short-term memory, Working memory Attention impairment (select first level of impairment): Selective attention Executive functioning impairment (select all impairments): Problem solving, Sequencing, Initiation, Organization, Reasoning                   Following commands:  Impaired Following commands impaired: Follows one step commands with increased time      Cueing   Cueing Techniques: Verbal cues, Tactile cues  Exercises Other Exercises Other Exercises: reinforced keeping R knee extended    Shoulder Instructions       General Comments      Pertinent Vitals/ Pain       Pain Assessment Pain Assessment: No/denies pain  Home Living                                          Prior Functioning/Environment              Frequency  Min 2X/week        Progress Toward Goals  OT Goals(current goals can now be found in the care plan section)  Progress towards OT goals: Progressing toward goals  Acute Rehab OT Goals OT Goal Formulation: With patient Time For Goal Achievement: 12/04/23 Potential to Achieve Goals: Good  Plan      Co-evaluation                 AM-PAC OT 6 Clicks Daily Activity     Outcome Measure   Help from another person eating meals?: None Help from another person taking care of personal grooming?: A Little Help from another person toileting, which includes using toliet, bedpan, or urinal?: Total Help from another person bathing (including washing, rinsing, drying)?: A Lot Help from another person to put on and taking off regular upper body clothing?: A Little Help from another person to put on and taking off regular lower body clothing?: A Lot 6 Click Score: 15    End of Session    OT Visit Diagnosis: Unsteadiness on feet (R26.81);Other abnormalities of gait and mobility (R26.89);Muscle weakness (generalized) (M62.81);Other symptoms and signs involving cognitive function   Activity Tolerance Patient tolerated treatment well   Patient Left in bed;with call bell/phone within reach;with bed alarm set   Nurse Communication          Time: 8484-8465 OT Time Calculation (min): 19 min  Charges: OT General Charges $OT Visit: 1 Visit OT Treatments $Therapeutic Activity: 8-22  mins  [name], OTR/L Acute Rehabilitation Services Office: [phone]   [name] 11/22/2023, 3:43 PM

## 2023-11-23 ENCOUNTER — Ambulatory Visit: Admitting: Licensed Clinical Social Worker

## 2023-11-23 DIAGNOSIS — R339 Retention of urine, unspecified: Secondary | ICD-10-CM

## 2023-11-23 LAB — RENAL FUNCTION PANEL
Albumin: 1.9 g/dL — ABNORMAL LOW (ref 3.5–5.0)
Anion gap: 7 (ref 5–15)
BUN: 56 mg/dL — ABNORMAL HIGH (ref 8–23)
CO2: 20 mmol/L — ABNORMAL LOW (ref 22–32)
Calcium: 8.5 mg/dL — ABNORMAL LOW (ref 8.9–10.3)
Chloride: 100 mmol/L (ref 98–111)
Creatinine, Ser: 2.2 mg/dL — ABNORMAL HIGH (ref 0.61–1.24)
GFR, Estimated: 33 mL/min — ABNORMAL LOW (ref >60)
Glucose, Bld: 125 mg/dL — ABNORMAL HIGH (ref 70–99)
Phosphorus: 3.8 mg/dL (ref 2.5–4.6)
Potassium: 5.1 mmol/L (ref 3.5–5.1)
Sodium: 127 mmol/L — ABNORMAL LOW (ref 135–145)

## 2023-11-23 LAB — CBC WITH DIFFERENTIAL/PLATELET
Abs Immature Granulocytes: 0.03 K/uL (ref 0.00–0.07)
Basophils Absolute: 0 K/uL (ref 0.0–0.1)
Basophils Relative: 0 %
Eosinophils Absolute: 0 K/uL (ref 0.0–0.5)
Eosinophils Relative: 0 %
HCT: 24.4 % — ABNORMAL LOW (ref 39.0–52.0)
Hemoglobin: 8 g/dL — ABNORMAL LOW (ref 13.0–17.0)
Immature Granulocytes: 0 %
Lymphocytes Relative: 19 %
Lymphs Abs: 1.4 K/uL (ref 0.7–4.0)
MCH: 27.9 pg (ref 26.0–34.0)
MCHC: 32.8 g/dL (ref 30.0–36.0)
MCV: 85 fL (ref 80.0–100.0)
Monocytes Absolute: 0.9 K/uL (ref 0.1–1.0)
Monocytes Relative: 12 %
Neutro Abs: 5 K/uL (ref 1.7–7.7)
Neutrophils Relative %: 69 %
Platelets: 319 K/uL (ref 150–400)
RBC: 2.87 MIL/uL — ABNORMAL LOW (ref 4.22–5.81)
RDW: 17.2 % — ABNORMAL HIGH (ref 11.5–15.5)
WBC: 7.3 K/uL (ref 4.0–10.5)
nRBC: 0 % (ref 0.0–0.2)

## 2023-11-23 LAB — GLUCOSE, CAPILLARY
Glucose-Capillary: 198 mg/dL — ABNORMAL HIGH (ref 70–99)
Glucose-Capillary: 158 mg/dL — ABNORMAL HIGH (ref 70–99)
Glucose-Capillary: 141 mg/dL — ABNORMAL HIGH (ref 70–99)

## 2023-11-23 LAB — URINE CULTURE: Culture: NO GROWTH

## 2023-11-23 NOTE — Progress Notes (Signed)
 HD#20 SUBJECTIVE:  Patient Summary: Derek Thompson is a 63 y.o. with a pertinent PMH of End-stage HFrEF, CKD, CVA, bladder outlet obstruction s/p coude foley catheter during last admission 10/2023, and chronic osteomyelitis , admitted hypovolemic hyponatremia and AKI with coude foley and on CTX, improving  Overnight Events: NAE  Interim History: Pt seen and examined at the bedside this morning. He denies pain, but is frustrated with his inability to be transferred to SNF right now due to insurance. He would like to speak with the Child psychotherapist. All questions and concerns were addressed at this time.    OBJECTIVE:  Vital Signs: Vitals:   11/23/23 0841 11/23/23 1036 11/23/23 1103 11/23/23 1246  BP: 101/62 99/66 103/64 98/67  Pulse: 95 92 92 94  Resp: 18     Temp: 98.3 F (36.8 C)   98.4 F (36.9 C)  TempSrc: Oral   Oral  SpO2: 99%   100%  Weight:      Height:       Supplemental O2: Room Air   Filed Weights   11/18/23 0500 11/19/23 0452 11/20/23 0436  Weight: 61.9 kg 62.5 kg 66.4 kg     Intake/Output Summary (Last 24 hours) at 11/23/2023 1422 Last data filed at 11/23/2023 1246 Gross per 24 hour  Intake 720 ml  Output 1700 ml  Net -980 ml   Net IO Since Admission: -17,549.19 mL [11/23/23 1422]  Physical Exam: Const: Awake, alert in NAD HENT: Normocephalic, atraumatic, Card: RRR, No pitting edema on LE's bilaterally  Resp: LCTAB, No increased work of breathing Abd: NTND, No suprapubic tenderness noted.  Extremities: RLE BKA with would vac and rigid BKA protector in place.   Patient Lines/Drains/Airways Status     Active Line/Drains/Airways     Name Placement date Placement time Site Days   Peripheral IV 11/03/23 22 G 1.75 Left;Proximal Forearm 11/03/23  0945  Forearm  1   Peripheral IV 11/04/23 20 G 1 Posterior;Right Forearm 11/04/23  0845  Forearm  less than 1   Urethral Catheter Mekides Nida, RN Coude 16 Fr. 11/03/23  1034  Coude  1   Wound / Incision  (Open or Dehisced) 09/01/23 Non-pressure wound;Amputation Foot Anterior;Right amputation of right big toe in february, reddened foot area with purulent drainage present 09/01/23  1721  Foot  64   Wound 11/02/23 1655 Vascular Ulcer Foot Anterior;Right 11/02/23  1655  Foot  2   Wound 11/02/23 1658 Vascular Ulcer Foot Right;Lateral 11/02/23  1658  Foot  2            Pertinent labs and imaging:      Latest Ref Rng & Units 11/23/2023    3:54 AM 11/22/2023    4:22 AM 11/21/2023    8:23 AM  CBC  WBC 4.0 - 10.5 K/uL 7.3  6.0  5.8   Hemoglobin 13.0 - 17.0 g/dL 8.0  8.9  8.8   Hematocrit 39.0 - 52.0 % 24.4  28.4  27.7   Platelets 150 - 400 K/uL 319  384  320        Latest Ref Rng & Units 11/23/2023    3:54 AM 11/22/2023    4:22 AM 11/21/2023    8:23 AM  CMP  Glucose 70 - 99 mg/dL 874  796  766   BUN 8 - 23 mg/dL 56  56  55   Creatinine 0.61 - 1.24 mg/dL 7.79  7.75  7.81   Sodium 135 - 145 mmol/L 127  129  126   Potassium 3.5 - 5.1 mmol/L 5.1  5.1  4.5   Chloride 98 - 111 mmol/L 100  98  98   CO2 22 - 32 mmol/L 20  20  17    Calcium  8.9 - 10.3 mg/dL 8.5  8.7  8.4    No results found.     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 This hospitalization it has continued to worsen. He was not on medications prior to  admission and has not wished to treat it in the past.  -POD 6 R BKA with Dr. Harden.  -Wound vac in place until follow up with Dr. Harden outpatient. -Pt denies pain at surgical site.  Febrile  CAUTI -Pt afebrile for last 24 hours -Remains without leukocytosis -Likely CAUTI induced, urine culture pending. Rocephin  day 3/7 -Coude catheter in place since 7/22. May keep in for 30 days per urology  Elevated Protein gap  SPEP with Positive M-Spike SPEP with gamma globulin 2.1 and Mspike 1.1. Kappa/Lambda light chain ratio 0.58. IgG elevated at 2,664 with lamba specificity, UPEP bence jones protein positive.  -This could also be contributing to hyponatremia in form of a pseudohyponatremia.  -Dr. Autumn with hematology consulted and following -Bone marrow biopsy 7/17--awaiting immunohistochemistry for further classification  Hyponatremia Likely multifactorial, including cardiorenal syndrome, AKI, bladder outlet obstruction, and hypovolemia. Pt continues to mildly improve with fluids, but this is not sustained.  -Could potentially be due to pseudohyponatremia due to presence of monoclonal gammopathy.   Debility -Pt currently on hospital day 21 with decreased mobility and the vast majority of time spent in the bed. Will continue to encourage working with PT/OT and out of bed throughout the day.  -POD 6 R BKA.  -PT/OT continue to recommend SNF at discharge to which the patient is agreeable.   AKI Bladder outlet obstruction with BL hydroureter NAGMA Since admission, AKI has significantly improved. Developed post-ATN diuresis, Will keep coude catheter in place. Urine culture pending -Renal function likely impacted by monoclonal gammopathy of renal significance. Hematology following -Sodium Bicarb 1300 TID for persistent NAGMA. Bicarb 17 today -Per urology, coude catheter to remain in place for 30 days and pt is to follow up outpatient.   Biventricular HFrEF EF 20-25%.  Persistent  Tacchycardia Hx of nonobstructive CAD. Nonadherent to medications. Not a candidate for advanced therapies. Suspicion of prior MI but none documented in the chart. Remains peripherally euvolemic. Pt has not had anymore episodes of tachypnea or orthopnea - Monitor electrolytes; goal Mg 2, K ~4. - Monitor urine output. - Continue metoprolol  25mg  BID  T2DM -BG spike last PM to 365, will adjust semglee  to 15 units -Continue SSI  Hx of LV thrombus (not visualized on TTE and MR 10/2023) New subscapular splenic infarct -Contine Eliquis  5mg  BID  Acute on chronic anemia -Low sat ratios during last hospitalization.  -S/p IV Iron  7/8. Will continue daily CBC's. Transfuse if Hbg <7  Encephalopathy Delirium Cefepime  neurotoxicity Resolved.   Sepsis Possible PNA -Resolved.   Multisystem organ failure Goals of care Palliative Consult Severe protein calorie malnutrition -Discussed multisystem organ failure and recurrent hospitalizations -Palliative care saw pt and pt expressed desire to continue to be full code, full scope.  -At this time, he and his family wish to continue aggressive care. -Per PT eval, Pt to d/c to SNF  Best Practice: Diet: Renal diet IVF: Fluids: none VTE: Lovenox  Code: Full  Disposition planning: Therapy Recs: Pending, DME: other pending DISPO: Anticipated discharge pending PT eval pending clinical improvement and R BKA.  Signature:  Schuyler Novak, DO [facility]  2:22 PM, 11/23/2023  On Call pager [phone]

## 2023-11-23 NOTE — Plan of Care (Signed)
  Problem: Health Behavior/Discharge Planning: Goal: Ability to manage health-related needs will improve Outcome: Progressing   Problem: Clinical Measurements: Goal: Ability to maintain clinical measurements within normal limits will improve Outcome: Progressing Goal: Will remain free from infection Outcome: Not Progressing Goal: Diagnostic test results will improve Outcome: Not Progressing Goal: Respiratory complications will improve Outcome: Progressing Goal: Cardiovascular complication will be avoided Outcome: Progressing   Problem: Activity: Goal: Risk for activity intolerance will decrease Outcome: Not Progressing   Refused to get in chair but did get out of bed. Temp 99 and pt reports feeling cold. Known UTI. Tylenol  given for pt comfort. Decreased PO intake. Does not tell us  when he is incontinent of stool

## 2023-11-23 NOTE — TOC Progression Note (Addendum)
 Transition of Care [facility]) - Progression Note    Patient Details  Name: Derek Thompson MRN: [id number] Date of Birth: 1960-08-07  Transition of Care [facility]) CM/SW Contact  Jacion Dismore SHAUNNA [place], [name] Phone Number: 11/23/2023, 12:36 PM  Clinical Narrative:     CSW contacted pt's son to inquire about long term disability application. Son reported that family has completed their portion and left the application for provider to completed. Son has returned to [place] at this time. Providers to follow up with disability paperwork.   Notified by provider that pt stated he is still supposed to have health insurance and is requesting to speak with CSW. CSW to follow up.   1400: met with pt to discuss insurance. Informed pt again that CSW and SNF have called his insurance and confirmed he does not have an active plan with the [facility] plan that is on file. CSW directs pt to call either his work or union rep since his health insurance is through work to see if there are any errors that can be resolved.   Expected Discharge Plan: Skilled Nursing Facility Barriers to Discharge: Continued Medical Work up, English as a second language teacher;               Expected Discharge Plan and Services In-house Referral: Clinical Social Work   Post Acute Care Choice: Skilled Nursing Facility Living arrangements for the past 2 months: Homeless Shelter                                       Social Drivers of Health (SDOH) Interventions SDOH Screenings   Food Insecurity: No Food Insecurity (11/02/2023)  Housing: High Risk (11/02/2023)  Transportation Needs: Unmet Transportation Needs (11/02/2023)  Utilities: Not At Risk (11/02/2023)  Alcohol Screen: Low Risk  (08/19/2022)  Depression (PHQ2-9): Low Risk  (10/11/2023)  Financial Resource Strain: Low Risk  (08/19/2022)  Tobacco Use: Medium Risk (11/17/2023)    Readmission Risk Interventions     No data to display

## 2023-11-23 NOTE — Progress Notes (Signed)
 Physical Therapy Treatment Patient Details Name: Derek Thompson MRN: [id number] DOB: 1960/12/12 Today's Date: 11/23/2023   History of Present Illness Pt is a 63 y/o M presenting to ED on 11/02/23 with lethargy. Pt found to be tachycardic, hyperkalemic, and hyponatremic. 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    PT Comments  Increased activity tolerance and independence with functional activity today. Pre-gait activity performed with emphasis on increasing standing tolerance, balance, posture. Patient performed several transfers with varied techniques with assistance. Pain appears well controlled. PT will continue to follow. Rehabilitation < 3 hours/day recommended after this hospital stay.    If plan is discharge home, recommend the following: A lot of help with bathing/dressing/bathroom;Supervision due to cognitive status;Help with stairs or ramp for entrance;A lot of help with walking and/or transfers;Assistance with cooking/housework;Assist for transportation   Can travel by private vehicle     No  Equipment Recommendations  Rolling walker (2 wheels);BSC/3in1;Wheelchair (measurements PT)    Recommendations for Other Services       Precautions / Restrictions Precautions Precautions: Fall;Other (comment) Recall of Precautions/Restrictions: Impaired Precaution/Restrictions Comments: R limb guard protector, wound vac Restrictions Weight Bearing Restrictions Per Provider Order: Yes RLE Weight Bearing Per Provider Order: Non weight bearing     Mobility  Bed Mobility Overal bed mobility: Needs Assistance Bed Mobility: Rolling, Sidelying to Sit, Sit to Supine Rolling: Supervision Sidelying to sit: Min assist   Sit  to supine: Contact guard assist   General bed mobility comments: assistance for upright trunk. cues for sequencing and initiation    Transfers Overall transfer level: Needs assistance Equipment used: Rolling walker (2 wheels) Transfers: Sit to/from Stand, Bed to chair/wheelchair/BSC Sit to Stand: Min assist, From elevated surface Stand pivot transfers: Min assist Step pivot transfers: Min assist       General transfer comment: patient able to stand from bed x 1 bout and then perform stand pivot to bed side commode and hop step from bed side commode to bed. cues for rolling walker, sequencing, technique    Ambulation/Gait             Pre-gait activities: standing tolerance of around 30 seconds with heavy reliance on rolling walker. cues for posture, weight shifting. standing activity tolerance limited by fatigue     Stairs             Wheelchair Mobility     Tilt Bed    Modified Rankin (Stroke Patients Only)       Balance Overall balance assessment: Needs assistance Sitting-balance support: Feet supported, No upper extremity supported Sitting balance-Leahy Scale: Good     Standing balance support: Bilateral upper extremity supported, Reliant on assistive device for balance Standing balance-Leahy Scale: Poor Standing balance comment: increased external support with dyanmic standing activity with heavy reliance on rolling walker for support                            Communication Communication Communication: No apparent difficulties  Cognition Arousal: Alert Behavior During Therapy: WFL for tasks assessed/performed   PT - Cognitive impairments: Initiation, Sequencing  Following commands: Impaired Following commands impaired: Follows one step commands with increased time    Cueing Cueing Techniques: Verbal cues, Tactile cues  Exercises      General Comments General comments (skin integrity, edema,  etc.): patient declined sitting up in the chair today. education on wheelchair push-up techniques and off loading for skin integrity and comfort when he is out of bed to chair.      Pertinent Vitals/Pain Pain Assessment Pain Assessment: No/denies pain    Home Living                          Prior Function            PT Goals (current goals can now be found in the care plan section) Acute Rehab PT Goals Patient Stated Goal: to be more independent PT Goal Formulation: With patient Time For Goal Achievement: 12/02/23 Potential to Achieve Goals: Good Progress towards PT goals: Progressing toward goals    Frequency    Min 2X/week      PT Plan      Co-evaluation              AM-PAC PT 6 Clicks Mobility   Outcome Measure  Help needed turning from your back to your side while in a flat bed without using bedrails?: A Little Help needed moving from lying on your back to sitting on the side of a flat bed without using bedrails?: A Little Help needed moving to and from a bed to a chair (including a wheelchair)?: A Little Help needed standing up from a chair using your arms (e.g., wheelchair or bedside chair)?: A Little Help needed to walk in hospital room?: A Lot Help needed climbing 3-5 steps with a railing? : Total 6 Click Score: 15    End of Session Equipment Utilized During Treatment: Gait belt Activity Tolerance: Patient tolerated treatment well Patient left: in bed;with call bell/phone within reach;with bed alarm set Nurse Communication: Mobility status PT Visit Diagnosis: Unsteadiness on feet (R26.81);Difficulty in walking, not elsewhere classified (R26.2);Other abnormalities of gait and mobility (R26.89)     Time: 9142-9086 PT Time Calculation (min) (ACUTE ONLY): 16 min  Charges:    $Therapeutic Activity: 8-22 mins PT General Charges $$ ACUTE PT VISIT: 1 Visit                     [name], PT, MPT    [name] 11/23/2023,  10:21 AM

## 2023-11-24 DIAGNOSIS — M86661 Other chronic osteomyelitis, right tibia and fibula: Secondary | ICD-10-CM | POA: Diagnosis not present

## 2023-11-24 LAB — GLUCOSE, CAPILLARY
Glucose-Capillary: 206 mg/dL — ABNORMAL HIGH (ref 70–99)
Glucose-Capillary: 95 mg/dL (ref 70–99)
Glucose-Capillary: 232 mg/dL — ABNORMAL HIGH (ref 70–99)
Glucose-Capillary: 104 mg/dL — ABNORMAL HIGH (ref 70–99)

## 2023-11-24 LAB — CBC
HCT: 24.6 % — ABNORMAL LOW (ref 39.0–52.0)
Hemoglobin: 8 g/dL — ABNORMAL LOW (ref 13.0–17.0)
MCH: 27.8 pg (ref 26.0–34.0)
MCHC: 32.5 g/dL (ref 30.0–36.0)
MCV: 85.4 fL (ref 80.0–100.0)
Platelets: 327 K/uL (ref 150–400)
RBC: 2.88 MIL/uL — ABNORMAL LOW (ref 4.22–5.81)
RDW: 17.2 % — ABNORMAL HIGH (ref 11.5–15.5)
WBC: 6.7 K/uL (ref 4.0–10.5)
nRBC: 0 % (ref 0.0–0.2)

## 2023-11-24 LAB — BASIC METABOLIC PANEL WITH GFR
Anion gap: 10 (ref 5–15)
BUN: 56 mg/dL — ABNORMAL HIGH (ref 8–23)
CO2: 21 mmol/L — ABNORMAL LOW (ref 22–32)
Calcium: 8.6 mg/dL — ABNORMAL LOW (ref 8.9–10.3)
Chloride: 97 mmol/L — ABNORMAL LOW (ref 98–111)
Creatinine, Ser: 2.42 mg/dL — ABNORMAL HIGH (ref 0.61–1.24)
GFR, Estimated: 29 mL/min — ABNORMAL LOW (ref >60)
Glucose, Bld: 165 mg/dL — ABNORMAL HIGH (ref 70–99)
Potassium: 5.5 mmol/L — ABNORMAL HIGH (ref 3.5–5.1)
Sodium: 128 mmol/L — ABNORMAL LOW (ref 135–145)

## 2023-11-24 MED ORDER — SODIUM CHLORIDE 0.9 % IV SOLN
1.0000 g | INTRAVENOUS | Status: AC
  Administered 2023-11-24 – 2023-11-26 (×3): 1 g via INTRAVENOUS
  Filled 2023-11-24 (×3): qty 10

## 2023-11-24 MED ORDER — SODIUM ZIRCONIUM CYCLOSILICATE 5 G PO PACK
5.0000 g | PACK | Freq: Every day | ORAL | Status: DC
  Filled 2023-11-24: qty 1

## 2023-11-24 MED ORDER — SODIUM ZIRCONIUM CYCLOSILICATE 10 G PO PACK
10.0000 g | PACK | Freq: Once | ORAL | Status: AC
  Administered 2023-11-24: 10 g via ORAL
  Filled 2023-11-24: qty 1

## 2023-11-24 MED ORDER — SODIUM BICARBONATE 650 MG PO TABS
1300.0000 mg | ORAL_TABLET | Freq: Three times a day (TID) | ORAL | Status: DC
  Administered 2023-11-24 – 2023-12-23 (×93): 1300 mg via ORAL
  Filled 2023-11-24 (×86): qty 2

## 2023-11-24 NOTE — Progress Notes (Addendum)
 HD#21 SUBJECTIVE:  Patient Summary: Derek Thompson is a 63 y.o. with a pertinent PMH of End-stage HFrEF, CKD, CVA, bladder outlet obstruction s/p coude foley catheter during last admission 10/2023, and chronic osteomyelitis , admitted hypovolemic hyponatremia and AKI with coude foley and on CTX, improving  Overnight Events: NAE  Interim History: Npt seen and examined at the bedside this morning. He was enthusiastic and speaking about being able to get out of the room yesterday with a wheelchair. He continues to have questions about transferring to SNF, but was able to discuss with the social worker yesterday. All questions and concerns were addressed at this time.   OBJECTIVE:  Vital Signs: Vitals:   11/23/23 1103 11/23/23 1246 11/23/23 1656 11/23/23 1957  BP: 103/64 98/67 118/76 112/69  Pulse: 92 94 (!) 108   Resp:   19 18  Temp:  98.4 F (36.9 C) 99 F (37.2 C) 98.9 F (37.2 C)  TempSrc:  Oral Oral   SpO2:  100% 100% 98%  Weight:      Height:       Supplemental O2: Room Air   Filed Weights   11/18/23 0500 11/19/23 0452 11/20/23 0436  Weight: 61.9 kg 62.5 kg 66.4 kg     Intake/Output Summary (Last 24 hours) at 11/24/2023 0759 Last data filed at 11/24/2023 0600 Gross per 24 hour  Intake 1160 ml  Output 3275 ml  Net -2115 ml   Net IO Since Admission: -18,984.19 mL [11/24/23 0759]  Physical Exam: Const: Awake, alert in NAD HENT: Normocephalic, atraumatic, Card: RRR, No pitting edema on LE's bilaterally  Resp: LCTAB, No increased work of breathing Abd: NTND, No suprapubic tenderness noted.  Extremities: RLE BKA with would vac and rigid BKA protector in place.   Patient Lines/Drains/Airways Status     Active Line/Drains/Airways     Name Placement date Placement time Site Days   Peripheral IV 11/03/23 22 G 1.75 Left;Proximal Forearm 11/03/23  0945  Forearm  1   Peripheral IV 11/04/23 20 G 1 Posterior;Right Forearm 11/04/23  0845  Forearm  less than 1    Urethral Catheter Mekides Nida, RN Coude 16 Fr. 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, reddened foot area with purulent drainage present 09/01/23  1721  Foot  64   Wound 11/02/23 1655 Vascular Ulcer Foot Anterior;Right 11/02/23  1655  Foot  2   Wound 11/02/23 1658 Vascular Ulcer Foot Right;Lateral 11/02/23  1658  Foot  2            Pertinent labs and imaging:      Latest Ref Rng & Units 11/24/2023    4:47 AM 11/23/2023    3:54 AM 11/22/2023    4:22 AM  CBC  WBC 4.0 - 10.5 K/uL 6.7  7.3  6.0   Hemoglobin 13.0 - 17.0 g/dL 8.0  8.0  8.9   Hematocrit 39.0 - 52.0 % 24.6  24.4  28.4   Platelets 150 - 400 K/uL 327  319  384        Latest Ref Rng & Units 11/24/2023    4:47 AM 11/23/2023    3:54 AM 11/22/2023    4:22 AM  CMP  Glucose 70 - 99 mg/dL 834  874  796   BUN 8 - 23 mg/dL 56  56  56   Creatinine 0.61 - 1.24 mg/dL 7.57  2.20  2.24   Sodium 135 - 145 mmol/L 128  127  129   Potassium 3.5 - 5.1 mmol/L 5.5  5.1  5.1   Chloride 98 - 111 mmol/L 97  100  98   CO2 22 - 32 mmol/L 21  20  20    Calcium  8.9 - 10.3 mg/dL 8.6  8.5  8.7    No results found.     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 Harden.  Denies pain at surgical site. -Wound vac in place until follow up with Dr. Harden outpatient.  Febrile  CAUTI -Resolved, Remains without leukocytosis -Coude catheter in place since 7/22. May keep in for 30 days per urology -Urine culture negative, however when looking at time of culture collection, it is documented after initiation of antibiotics. Due to original UA drawn from catheter, we will continue with rocephin  for 7 day course. Day 4/7  Elevated Protein gap  SPEP with Positive M-Spike SPEP with gamma globulin 2.1 and Mspike 1.1. Kappa/Lambda light chain ratio 0.58. IgG elevated at 2,664 with lamba specificity, UPEP bence jones protein positive.  -Beta-2 microglobulin 7.2 -Dr. Autumn with hematology consulted and following -Bone marrow biopsy 7/17-- Per addendum: CD56 appears to be positive in a subset of the aberrant plasma cells, suggestive of a neoplastic process -With biopsy and lab results, this is suggestive of multiple myeloma--will reach back out to oncology for further plans while pt is inpatient. -Discussed with patient today who verbalized understanding.  -Likely an explanation for many ongoing issues including poor renal function and electrolyte abnormalities.   Hyperkalemia -Pt intermittently hyperkalemic. Potentially due to decreased renal function and persistent acidosis -Today 5.5. Remains without EKG changes.  -Will initiate 5mg  lokelma  daily  Hyponatremia Likely multifactorial, including cardiorenal syndrome, AKI, bladder outlet obstruction, and hypovolemia. Pt continues to mildly improve with fluids, but this is not sustained.  -Could potentially be due to pseudohyponatremia due to presence of monoclonal gammopathy.   Debility -Pt currently on hospital day 22 with decreased mobility and the vast majority of time spent in the bed. Will continue to encourage working with PT/OT and out of bed throughout the day.  -POD 7 R BKA.  -PT/OT continue to recommend SNF at  discharge to which the patient is agreeable.   AKI Bladder outlet obstruction with BL hydroureter NAGMA Since admission, AKI has significantly improved. Developed post-ATN diuresis, Will keep coude catheter in place. Urine culture pending -Renal function likely impacted by monoclonal gammopathy of renal significance. Hematology following -Will increase Sodium Bicarb 1300 to TID for persistent NAGMA. Bicarb 20 today -Per urology, coude catheter to remain in place for 30 days and pt is to follow up outpatient.   Biventricular HFrEF EF 20-25%.  Persistent Tacchycardia Hx of nonobstructive CAD. Nonadherent to medications.  Not a candidate for advanced therapies. Suspicion of prior MI but none documented in the chart. Remains peripherally euvolemic. Pt has not had anymore episodes of tachypnea or orthopnea - Monitor electrolytes; goal Mg 2, K ~4. - Monitor urine output. - Continue metoprolol  25mg  BID  T2DM -BG 125-198 -Continue SSI and 15 semglee   Hx of LV thrombus (not visualized on TTE and MR 10/2023) New subscapular splenic infarct -Contine Eliquis  5mg  BID  Acute on chronic anemia -Low sat ratios during last hospitalization.  -S/p IV Iron  7/8. Will continue daily CBC's. Transfuse if Hbg <7  Encephalopathy Delirium Cefepime  neurotoxicity Resolved.   Sepsis Possible PNA -Resolved.   Multisystem organ failure Goals of care Palliative Consult Severe protein calorie malnutrition -Discussed multisystem organ failure and recurrent hospitalizations -Palliative care saw pt and pt expressed desire to continue to be full code, full scope.  -At this time, he and his family wish to continue aggressive care. -Per PT eval, Pt to d/c to SNF  Best Practice: Diet: Renal diet IVF: Fluids: none VTE: Lovenox  Code: Full  Disposition planning: Therapy Recs: Pending, DME: other pending DISPO: Anticipated discharge pending PT eval pending clinical improvement and R  BKA.  Signature:  Schuyler Novak, DO [facility]  7:59 AM, 11/24/2023  On Call pager [phone]

## 2023-11-24 NOTE — TOC Progression Note (Signed)
 Transition of Care [facility]) - Progression Note    Patient Details  Name: Derek Thompson MRN: [id number] Date of Birth: 10-18-1960  [facility] CM/SW Contact  [name] [place], [name] Phone Number: 11/24/2023, 3:32 PM  Clinical Narrative:     CSW notified that provider has completed disability paperwork. CSW received form titled Short Term Disability Claim Form from pt and emailed it to pt's son at [email or web address].   Expected Discharge Plan: Skilled Nursing Facility Barriers to Discharge: Inadequate or no insurance               Expected Discharge Plan and Services In-house Referral: Clinical Social Work   Post Acute Care Choice: Skilled Nursing Facility Living arrangements for the past 2 months: Homeless Shelter                                       Social Drivers of Health (SDOH) Interventions SDOH Screenings   Food Insecurity: No Food Insecurity (11/02/2023)  Housing: High Risk (11/02/2023)  Transportation Needs: Unmet Transportation Needs (11/02/2023)  Utilities: Not At Risk (11/02/2023)  Alcohol Screen: Low Risk  (08/19/2022)  Depression (PHQ2-9): Low Risk  (10/11/2023)  Financial Resource Strain: Low Risk  (08/19/2022)  Tobacco Use: Medium Risk (11/17/2023)    Readmission Risk Interventions     No data to display

## 2023-11-24 NOTE — Progress Notes (Signed)
 Physical Therapy Treatment Patient Details Name: Derek Thompson MRN: [id number] DOB: 02/25/1961 Today's Date: 11/24/2023   History of Present Illness Pt is a 63 y/o M presenting to ED on 11/02/23 with lethargy. Pt found to be tachycardic, hyperkalemic, and hyponatremic. 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    PT Comments  Patient is motivated to participate with out of bed activity but does not like sitting up in the chair. Progressed ambulation distance this session, however patient fatigues quickly with limited standing tolerance. Frequent rest breaks required during mobility. Recommend to continue PT to maximize independence. Rehabilitation < 3 hours/day recommended after this hospital stay.    If plan is discharge home, recommend the following: A lot of help with bathing/dressing/bathroom;Supervision due to cognitive status;Help with stairs or ramp for entrance;A lot of help with walking and/or transfers;Assistance with cooking/housework;Assist for transportation   Can travel by private vehicle     No  Equipment Recommendations  Rolling walker (2 wheels);BSC/3in1;Wheelchair (measurements PT)    Recommendations for Other Services       Precautions / Restrictions Precautions Precautions: Fall;Other (comment) Recall of Precautions/Restrictions: Impaired Precaution/Restrictions Comments: R limb guard protector, wound vac Restrictions Weight Bearing Restrictions Per Provider Order: Yes RLE Weight Bearing Per Provider Order: Non weight bearing     Mobility  Bed Mobility Overal bed mobility: Needs Assistance Bed Mobility: Supine to Sit, Sit to Supine     Supine to sit: Contact guard Sit to supine: Min assist    General bed mobility comments: increased time required. assistance for LE support to return to bed. cues for initiation and sequencing    Transfers Overall transfer level: Needs assistance Equipment used: Rolling walker (2 wheels) Transfers: Sit to/from Stand, Bed to chair/wheelchair/BSC Sit to Stand: Min assist, From elevated surface Stand pivot transfers: Min assist         General transfer comment: cues for technique, hand placement for safety. lifting assistance required for standing from various surfaces including bed and bed side commode    Ambulation/Gait Ambulation/Gait assistance: Min assist Gait Distance (Feet): 6 Feet Assistive device: Rolling walker (2 wheels) Gait Pattern/deviations:  (hop step) Gait velocity: decreased     General Gait Details: cues for technique using rolling walker for hop steps. steadying assistance provided. may benefit from chair follow for longer distance ambulation due to quick fatigue and limited standing tolerance   Stairs             Wheelchair Mobility     Tilt Bed    Modified Rankin (Stroke Patients Only)       Balance Overall balance assessment: Needs assistance Sitting-balance support: No upper extremity supported Sitting balance-Leahy Scale: Good     Standing balance support: Bilateral upper extremity supported, Reliant on assistive device for balance Standing balance-Leahy Scale: Poor Standing balance comment: heavy relaince on rolling walker. increased assistance for dynamic activity                            Communication Communication Communication: No apparent difficulties  Cognition Arousal: Alert Behavior During Therapy: WFL for tasks assessed/performed   PT - Cognitive impairments: Initiation, Sequencing  PT - Cognition Comments: decreased awareness of deficits and need for assistance. Following commands: Impaired Following commands impaired: Follows one  step commands with increased time    Cueing Cueing Techniques: Verbal cues, Tactile cues  Exercises      General Comments General comments (skin integrity, edema, etc.): patient needing to have bowel movement with initial stand. maximal assistance for pericare following bowel movement      Pertinent Vitals/Pain Pain Assessment Pain Assessment: Faces Faces Pain Scale: Hurts a little bit Pain Location: lower back    Home Living                          Prior Function            PT Goals (current goals can now be found in the care plan section) Acute Rehab PT Goals Patient Stated Goal: to be more independent PT Goal Formulation: With patient Time For Goal Achievement: 12/02/23 Potential to Achieve Goals: Good Progress towards PT goals: Progressing toward goals    Frequency    Min 2X/week      PT Plan      Co-evaluation              AM-PAC PT 6 Clicks Mobility   Outcome Measure  Help needed turning from your back to your side while in a flat bed without using bedrails?: A Little Help needed moving from lying on your back to sitting on the side of a flat bed without using bedrails?: A Little Help needed moving to and from a bed to a chair (including a wheelchair)?: A Little Help needed standing up from a chair using your arms (e.g., wheelchair or bedside chair)?: A Little Help needed to walk in hospital room?: A Lot Help needed climbing 3-5 steps with a railing? : Total 6 Click Score: 15    End of Session Equipment Utilized During Treatment: Gait belt Activity Tolerance: Patient tolerated treatment well Patient left: in bed;with call bell/phone within reach;with bed alarm set Nurse Communication: Mobility status PT Visit Diagnosis: Unsteadiness on feet (R26.81);Difficulty in walking, not elsewhere classified (R26.2);Other abnormalities of gait and mobility (R26.89)     Time: 9182-9164 PT Time Calculation (min) (ACUTE ONLY): 18  min  Charges:    $Therapeutic Activity: 8-22 mins PT General Charges $$ ACUTE PT VISIT: 1 Visit                     [name], PT, MPT    [name] 11/24/2023, 11:08 AM

## 2023-11-25 DIAGNOSIS — N179 Acute kidney failure, unspecified: Secondary | ICD-10-CM | POA: Diagnosis not present

## 2023-11-25 DIAGNOSIS — M86171 Other acute osteomyelitis, right ankle and foot: Secondary | ICD-10-CM | POA: Diagnosis not present

## 2023-11-25 DIAGNOSIS — E875 Hyperkalemia: Secondary | ICD-10-CM | POA: Diagnosis not present

## 2023-11-25 DIAGNOSIS — D649 Anemia, unspecified: Secondary | ICD-10-CM | POA: Diagnosis not present

## 2023-11-25 LAB — RENAL FUNCTION PANEL
Albumin: 1.9 g/dL — ABNORMAL LOW (ref 3.5–5.0)
Anion gap: 11 (ref 5–15)
BUN: 55 mg/dL — ABNORMAL HIGH (ref 8–23)
CO2: 21 mmol/L — ABNORMAL LOW (ref 22–32)
Calcium: 8.6 mg/dL — ABNORMAL LOW (ref 8.9–10.3)
Chloride: 98 mmol/L (ref 98–111)
Creatinine, Ser: 2.34 mg/dL — ABNORMAL HIGH (ref 0.61–1.24)
GFR, Estimated: 30 mL/min — ABNORMAL LOW (ref >60)
Glucose, Bld: 73 mg/dL (ref 70–99)
Phosphorus: 3.4 mg/dL (ref 2.5–4.6)
Potassium: 5.7 mmol/L — ABNORMAL HIGH (ref 3.5–5.1)
Sodium: 130 mmol/L — ABNORMAL LOW (ref 135–145)

## 2023-11-25 LAB — CBC
HCT: 23.6 % — ABNORMAL LOW (ref 39.0–52.0)
Hemoglobin: 7.5 g/dL — ABNORMAL LOW (ref 13.0–17.0)
MCH: 27.6 pg (ref 26.0–34.0)
MCHC: 31.8 g/dL (ref 30.0–36.0)
MCV: 86.8 fL (ref 80.0–100.0)
Platelets: 314 K/uL (ref 150–400)
RBC: 2.72 MIL/uL — ABNORMAL LOW (ref 4.22–5.81)
RDW: 17.6 % — ABNORMAL HIGH (ref 11.5–15.5)
WBC: 6.7 K/uL (ref 4.0–10.5)
nRBC: 0 % (ref 0.0–0.2)

## 2023-11-25 LAB — GLUCOSE, CAPILLARY
Glucose-Capillary: 74 mg/dL (ref 70–99)
Glucose-Capillary: 274 mg/dL — ABNORMAL HIGH (ref 70–99)
Glucose-Capillary: 85 mg/dL (ref 70–99)
Glucose-Capillary: 117 mg/dL — ABNORMAL HIGH (ref 70–99)

## 2023-11-25 MED ORDER — SODIUM ZIRCONIUM CYCLOSILICATE 10 G PO PACK
10.0000 g | PACK | Freq: Once | ORAL | Status: AC
  Administered 2023-11-25: 10 g via ORAL
  Filled 2023-11-25: qty 1

## 2023-11-25 NOTE — Progress Notes (Signed)
 HD#22 SUBJECTIVE:  Patient Summary: Derek Thompson is a 63 y.o. 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 Declines chest pain, shortness of breath.  Good appetite. Foley bag with clear urine today. Main question today was around his discharge planning, which, unfortunately, depends on short disability insurance  OBJECTIVE:  Vital Signs: Vitals:   11/24/23 2005 11/25/23 0508 11/25/23 0632 11/25/23 0722  BP: 105/69 93/63  105/69  Pulse: (!) 106 92  91  Resp: 18 18  18   Temp: 98.7 F (37.1 C) 98.4 F (36.9 C)  (!) 97.5 F (36.4 C)  TempSrc: Oral Oral  Oral  SpO2: 99% 99%  100%  Weight:   59.5 kg   Height:       Supplemental O2: Room Air   Filed Weights   11/19/23 0452 11/20/23 0436 11/25/23 0632  Weight: 62.5 kg 66.4 kg 59.5 kg     Intake/Output Summary (Last 24 hours) at 11/25/2023 1147 Last data filed at 11/25/2023 0900 Gross per 24 hour  Intake 800 ml  Output 1740 ml  Net -940 ml   Net IO Since Admission: -19,804.19 mL [11/25/23 1147]  Physical Exam: Const: Chronically ill appearing man in NAD Card: RRR, no murmurs Resp: Normal work of breathing, no wheezing or rales Abd: Soft, non tender Extremities: RLE BKA with would vac without tender  Patient Lines/Drains/Airways Status     Active Line/Drains/Airways     Name Placement date Placement time Site Days   Peripheral IV 11/03/23 22 G 1.75 Left;Proximal Forearm 11/03/23  0945  Forearm  1   Peripheral IV 11/04/23 20 G 1 Posterior;Right Forearm 11/04/23  0845  Forearm  less than 1   Urethral Catheter Mekides Nida, RN Coude 16 Fr. 11/03/23  1034  Coude  1   Wound / Incision (Open  or Dehisced) 09/01/23 Non-pressure wound;Amputation Foot Anterior;Right amputation of right big toe in february, reddened foot area with purulent drainage present 09/01/23  1721  Foot  63   Wound 11/02/23 1655 Vascular Ulcer Foot Anterior;Right 11/02/23  1655  Foot  2   Wound 11/02/23 1658 Vascular Ulcer Foot Right;Lateral 11/02/23  1658  Foot  2            Pertinent labs and imaging:      Latest Ref Rng & Units 11/25/2023    4:13 AM 11/24/2023    4:47 AM 11/23/2023    3:54 AM  CBC  WBC 4.0 - 10.5 K/uL 6.7  6.7  7.3   Hemoglobin 13.0 - 17.0 g/dL 7.5  8.0  8.0   Hematocrit 39.0 - 52.0 % 23.6  24.6  24.4   Platelets 150 - 400 K/uL 314  327  319        Latest Ref Rng & Units 11/25/2023    4:13 AM 11/24/2023    4:47 AM 11/23/2023    3:54 AM  CMP  Glucose 70 - 99 mg/dL 73  834  874   BUN 8 - 23 mg/dL 55  56  56   Creatinine 0.61 - 1.24 mg/dL 7.65  7.57  7.79   Sodium 135 - 145 mmol/L 130  128  127  Potassium 3.5 - 5.1 mmol/L 5.7  5.5  5.1   Chloride 98 - 111 mmol/L 98  97  100   CO2 22 - 32 mmol/L 21  21  20    Calcium  8.9 - 10.3 mg/dL 8.6  8.6  8.5    No results found.  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 May keep in for 30 days per urology -Urine culture negative,  mismatched culture collection - Finish 7 day CTX tx, day 5/7 - Continue Tamsulosin  - Per urology, coude catheter to remain in place for 30 days and pt is to follow up outpatient.   Hyperkalemia Unclear etiology. 5.7 today. No EKG changes - Increase Lokelma  to 10 mg   Chronic osteomyelitis R foot PAD S/P BKA 11/17/23 -POD 7 R BKA with Dr. Harden. Denies pain at surgical site. -Wound vac in place until follow up with Dr. Harden outpatient.  Elevated Protein gap  SPEP with Positive M-Spike Suspicious for multiple myeloma SPEP with gamma globulin 2.1 and Mspike 1.1. Kappa/Lambda light chain ratio 0.58. IgG elevated at 2,664 with lamba specificity, UPEP bence jones protein positive.  -Beta-2 microglobulin 7.2 -Bone marrow biopsy 7/17: CD56 positive in a subset of the aberrant plasma cells, suggestive of a neoplastic process, suggestive of multiple myeloma -Dr. Autumn with hematology consulted; will follow results outpatient  Hyponatremia Likely multifactorial, including cardiorenal syndrome, AKI, bladder outlet obstruction, and hypovolemia. Pt continues to mildly improve with fluids, but this is not sustained.  -Could potentially be due to pseudohyponatremia due to presence of monoclonal gammopathy.   Functional deconditioning -Continue PT/OT until able to transition to SNF  AKI on CKD3 NAGMA Currently stably elevated. -Renal function likely impacted by monoclonal gammopathy of renal significance.  -On Sodium Bicarb 1300 to TID for persistent NAGMA.   Biventricular HFrEF EF 20-25%.  Persistent Tacchycardia Hx of nonobstructive CAD. Nonadherent to medications PTA. Not a candidate for advanced therapies. Euvolemic.  - Monitor electrolytes; goal Mg 2, K ~4. - Monitor urine output. - Continue metoprolol  25mg  BID  T2DM -BG 125-198 -Continue SSI and 15 semglee   Hx of LV thrombus (not visualized on TTE and MR 10/2023) New subscapular splenic infarct -Contine Eliquis  5mg  BID  Acute on  chronic anemia -Low sat ratios during last hospitalization.  -S/p IV Iron  7/8.  - Hgb 7.5 today -Transfuse if Hbg <7  Best Practice: Diet: Renal diet IVF: Fluids: none VTE: Lovenox  Code: Full  Disposition planning: Therapy Recs: Pending, DME: other pending DISPO: Anticipated discharge SNF when able to obtain insurance. He is medically stable for discharge  Signature:  Hadassah Ala, MD [facility]  11:47 AM, 11/25/2023  On Call pager [phone]

## 2023-11-25 NOTE — Progress Notes (Signed)
 2331: Alerted that patient had a fall.  Nurse notified us  that patient was at bedside commode for BM, and afterwards was standing at his walker after and getting cleaned up when he fell down directly on his bottom. Nurse and nurse aid both present and attending patient at the time of fall, but were not able to catch him as he went directly down. Nurse confirmed wound vac and foley appropriately in place. VSS, no focal neuro deficits. Visited patient at bedside who endorsed the same history, that he went directly down on his bottom and did not hit his head. He denied dizziness at or after the time of his fall. Denies midline spinal tenderness to palpation. Denies any pain elsewhere. Patient is currently on DOAC Eliquis  and aspirin , but do not think it is necessary at this time to obtain any imaging. Discussed with patient to let us  know if anything changes and he develops pain or other symptoms. Will plan for q2 hr vitals for 24 hrs following fall incident per [facility] guidelines. - q2 hr vitals for 24 hrs post fall - CTM    Derek Miyamoto, MD [facility]  PGY-1  November 25, 2023, 11:57 PM

## 2023-11-26 LAB — MAGNESIUM: Magnesium: 1.7 mg/dL (ref 1.7–2.4)

## 2023-11-26 LAB — CBC
HCT: 25.7 % — ABNORMAL LOW (ref 39.0–52.0)
Hemoglobin: 7.9 g/dL — ABNORMAL LOW (ref 13.0–17.0)
MCH: 27.5 pg (ref 26.0–34.0)
MCHC: 30.7 g/dL (ref 30.0–36.0)
MCV: 89.5 fL (ref 80.0–100.0)
Platelets: 343 K/uL (ref 150–400)
RBC: 2.87 MIL/uL — ABNORMAL LOW (ref 4.22–5.81)
RDW: 17.3 % — ABNORMAL HIGH (ref 11.5–15.5)
WBC: 5.7 K/uL (ref 4.0–10.5)
nRBC: 0 % (ref 0.0–0.2)

## 2023-11-26 LAB — RENAL FUNCTION PANEL
Albumin: 2.1 g/dL — ABNORMAL LOW (ref 3.5–5.0)
Anion gap: 11 (ref 5–15)
BUN: 61 mg/dL — ABNORMAL HIGH (ref 8–23)
CO2: 21 mmol/L — ABNORMAL LOW (ref 22–32)
Calcium: 8.8 mg/dL — ABNORMAL LOW (ref 8.9–10.3)
Chloride: 97 mmol/L — ABNORMAL LOW (ref 98–111)
Creatinine, Ser: 2.51 mg/dL — ABNORMAL HIGH (ref 0.61–1.24)
GFR, Estimated: 28 mL/min — ABNORMAL LOW (ref >60)
Glucose, Bld: 238 mg/dL — ABNORMAL HIGH (ref 70–99)
Phosphorus: 3.5 mg/dL (ref 2.5–4.6)
Potassium: 5.6 mmol/L — ABNORMAL HIGH (ref 3.5–5.1)
Sodium: 129 mmol/L — ABNORMAL LOW (ref 135–145)

## 2023-11-26 LAB — CULTURE, BLOOD (ROUTINE X 2)
Culture: NO GROWTH
Culture: NO GROWTH
Special Requests: ADEQUATE
Special Requests: ADEQUATE

## 2023-11-26 LAB — LACTIC ACID, PLASMA: Lactic Acid, Venous: 1.4 mmol/L (ref 0.5–1.9)

## 2023-11-26 LAB — GLUCOSE, CAPILLARY
Glucose-Capillary: 206 mg/dL — ABNORMAL HIGH (ref 70–99)
Glucose-Capillary: 204 mg/dL — ABNORMAL HIGH (ref 70–99)
Glucose-Capillary: 161 mg/dL — ABNORMAL HIGH (ref 70–99)
Glucose-Capillary: 182 mg/dL — ABNORMAL HIGH (ref 70–99)

## 2023-11-26 MED ORDER — SODIUM ZIRCONIUM CYCLOSILICATE 10 G PO PACK
10.0000 g | PACK | Freq: Every day | ORAL | Status: DC
  Administered 2023-11-26 – 2023-11-29 (×3): 10 g via ORAL
  Filled 2023-11-26 (×4): qty 1

## 2023-11-26 NOTE — Progress Notes (Addendum)
 HD#23 SUBJECTIVE:  Patient Summary: Derek Thompson is a 63 y.o. with a pertinent PMH of End-stage HFrEF, CKD, CVA, bladder outlet obstruction s/p coude foley catheter during last admission 10/2023, and chronic osteomyelitis , admitted hypovolemic hyponatremia and AKI with coude foley and on CTX, improving  Overnight Events: Pt experienced assisted fall to the ground while using the bedside commode. Per report, no concern for head injury and pt denying symptoms. He also became febrile again last night with hypotension.   Interim History: Pt seen and examined at the bedside this morning. He described his fall last night stating that he lost his balanced and he should've zigged when he zagged causing him to fall on his bum. He also described often waking in bed unknowingly having soiled himself. This has been an issue for multiple years.   OBJECTIVE:  Vital Signs: Vitals:   11/26/23 0247 11/26/23 0411 11/26/23 0514 11/26/23 0720  BP: 108/72 (!) 87/60 (!) 87/50 107/65  Pulse: (!) 116 (!) 101 96 89  Resp: 19 18 18 18   Temp: (!) 101.6 F (38.7 C) 99 F (37.2 C)  98.5 F (36.9 C)  TempSrc: Oral Oral Oral Oral  SpO2: 97% 97% 99% 100%  Weight:   61 kg   Height:       Supplemental O2: Room Air   Filed Weights   11/20/23 0436 11/25/23 0632 11/26/23 0514  Weight: 66.4 kg 59.5 kg 61 kg     Intake/Output Summary (Last 24 hours) at 11/26/2023 0733 Last data filed at 11/26/2023 0413 Gross per 24 hour  Intake 1120 ml  Output 840 ml  Net 280 ml   Net IO Since Admission: -19,524.19 mL [11/26/23 0733]  Physical Exam: Const: Awake, alert in NAD HENT: Normocephalic, atraumatic, Card: RRR, No pitting edema on LE's bilaterally  Resp: LCTAB, No increased work of breathing Abd: NTND, No suprapubic tenderness noted.  Extremities: RLE BKA with would vac and rigid BKA protector in place.   Patient Lines/Drains/Airways Status     Active Line/Drains/Airways     Name Placement date  Placement time Site Days   Peripheral IV 11/03/23 22 G 1.75 Left;Proximal Forearm 11/03/23  0945  Forearm  1   Peripheral IV 11/04/23 20 G 1 Posterior;Right Forearm 11/04/23  0845  Forearm  less than 1   Urethral Catheter Derek Nida, RN Coude 16 Fr. 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, reddened foot area with purulent drainage present 09/01/23  1721  Foot  64   Wound 11/02/23 1655 Vascular Ulcer Foot Anterior;Right 11/02/23  1655  Foot  2   Wound 11/02/23 1658 Vascular Ulcer Foot Right;Lateral 11/02/23  1658  Foot  2            Pertinent labs and imaging:      Latest Ref Rng & Units 11/26/2023   10:24 AM 11/25/2023    4:13 AM 11/24/2023    4:47 AM  CBC  WBC 4.0 - 10.5 K/uL 5.7  6.7  6.7   Hemoglobin 13.0 - 17.0 g/dL 7.9  7.5  8.0   Hematocrit 39.0 - 52.0 % 25.7  23.6  24.6   Platelets 150 - 400 K/uL 343  314  327        Latest Ref Rng & Units 11/26/2023   10:24 AM 11/25/2023    4:13 AM 11/24/2023    4:47 AM  CMP  Glucose 70 - 99 mg/dL 761  73  834   BUN 8 - 23 mg/dL 61  55  56   Creatinine 0.61 - 1.24 mg/dL 7.48  7.65  7.57   Sodium 135 - 145 mmol/L 129  130  128   Potassium 3.5 - 5.1 mmol/L 5.6  5.7  5.5   Chloride 98 - 111 mmol/L 97  98  97   CO2 22 - 32 mmol/L 21  21  21    Calcium  8.9 - 10.3 mg/dL 8.8  8.6  8.6    No results found.     ASSESSMENT/PLAN:  Assessment: Principal Problem:   Osteomyelitis of right foot (HCC) Active Problems:   CAD (coronary artery disease)   Hyperkalemia   AKI (acute kidney injury) (HCC)   Anemia   LV (left ventricular) mural thrombus without MI (HCC)   Gangrene of right foot (HCC)   Hyponatremia   HFrEF (heart failure with reduced ejection fraction) (HCC)   Chronic osteomyelitis involving lower leg, right (HCC)   Frailty   Urinary retention   Generalized weakness   Sinus tachycardia   Chronic ulcer  of right foot limited to breakdown of skin (HCC)   Chronic anemia   Hypoalbuminemia   Monoclonal gammopathy   Plan: Febrile SIRS -Overnight, pt became febrile yet again with associated hypotension at 87/50.  -Currently meeting SIRS criteria.  -Remains on Ceftriaxone  through 7/29 for CAUTI coverage -Will obtain blood cultures this morning  CAUTI -Urine culture negative, however when looking at time of culture collection, it is documented after initiation of antibiotics. Due to original UA drawn from catheter, we will continue with rocephin  for 7 day course. Day 5/7 -Coude catheter in place since 7/22. May keep in for 30 days per urology -Pyuria noted in urine bag today, will attempt to flush and ensure it is draining appropriately.  Bowel Incontinence S/p prostatic EBRT -Today on exam, the patient described frequently waking in the morning having soiled himself. This is a chronic issue that he has experienced since around the time he went through cancer treatment. After reviewing urology documentation, pt did undergo External Bean Radiation Therapy in 2022. Suspect this is likely the culprit of persistent incontinence. Low suspicion for neurogenic causes at this time.    Chronic osteomyelitis R foot PAD S/P BKA 11/17/23 -POD 9 R BKA with Dr. Harden. Denies pain at surgical site. -Wound vac in place until follow up with Dr. Harden outpatient.  Elevated Protein gap  SPEP with Positive M-Spike SPEP with gamma globulin 2.1 and Mspike 1.1. Kappa/Lambda light chain ratio 0.58. IgG elevated at 2,664 with lamba specificity, UPEP bence jones protein positive. Beta-2 microglobulin 7.2 -Bone marrow biopsy 7/17-- Per addendum: CD56 appears to be positive in a subset of the aberrant plasma cells, suggestive of a neoplastic process -With biopsy and lab results, this is suggestive of multiple myeloma -Dr. Autumn with hematology consulted and will follow outpatient -Likely an explanation for many ongoing  issues including poor renal function and electrolyte abnormalities.   Hyperkalemia -Pt intermittently hyperkalemic. Potentially due to decreased renal function and persistent acidosis -Today 5.5. Remains without EKG changes.  -10mg  Lokelma  daily  Hyponatremia Likely multifactorial, including cardiorenal syndrome, AKI, bladder outlet obstruction, and hypovolemia.  -High suspicion for pseudohyponatremia due to presence of monoclonal gammopathy.   Debility Functional Deconditioning -Pt currently on hospital day 24 with decreased mobility and the vast majority of time spent in the bed.  -POD 9 R BKA.  -Continue  PT/OT -Pt experienced a GLF last night with walker. No injury obtained, will continue to monitor closely for development of symtomps including pain, neurologic changes, or hematoma formation.    AKI Bladder outlet obstruction with BL hydroureter NAGMA Since admission, AKI has significantly improved. Developed post-ATN diuresis, Will keep coude catheter in place. Per urology, needs to be replaced every 30 days.  -Renal function likely impacted by monoclonal gammopathy of renal significance. Hematology following -Sodium Bicarb 1300 to TID for persistent NAGMA. Bicarb 20 today  Biventricular HFrEF EF 20-25%.  Persistent Tacchycardia Hx of nonobstructive CAD. Nonadherent to medications. Not a candidate for advanced therapies. Suspicion of prior MI but none documented in the chart. Remains peripherally euvolemic.  - Monitor electrolytes; goal Mg 2, K ~4. - Monitor urine output. - Continue metoprolol  25mg  BID  T2DM -BG 73-274 -Continue SSI and 15 semglee   Hx of LV thrombus (not visualized on TTE and MR 10/2023) New subscapular splenic infarct -Contine Eliquis  5mg  BID  Acute on chronic anemia -Low sat ratios during last hospitalization.  -S/p IV Iron  7/8. Will continue daily CBC's. Transfuse if Hbg <7  Encephalopathy Delirium Cefepime  neurotoxicity Resolved.    Sepsis Possible PNA -Resolved.   Multisystem organ failure Goals of care Palliative Consult Severe protein calorie malnutrition -Discussed multisystem organ failure and recurrent hospitalizations -Palliative care saw pt and pt expressed desire to continue to be full code, full scope.  -At this time, he and his family wish to continue aggressive care. -Per PT eval, Pt to d/c to SNF  Best Practice: Diet: Renal diet IVF: Fluids: none VTE: Lovenox  Code: Full  Disposition planning: Therapy Recs: Pending, DME: other pending DISPO: Anticipated discharge SNF pending placement and clinical improvement.  Signature:  Schuyler Novak, DO [facility]  7:33 AM, 11/26/2023  On Call pager [phone]

## 2023-11-26 NOTE — Progress Notes (Signed)
 MEWS Progress Note  Patient Details Name: Derek Thompson MRN: [id number] DOB: 01-Mar-1961 Today's Date: 11/26/2023   MEWS Flowsheet Documentation:  Assess: MEWS Score Temp: 98.1 F (36.7 C) BP: (!) 87/50 MAP (mmHg): (!) 61 Pulse Rate: 96 ECG Heart Rate: (!) 106 Resp: 18 Level of Consciousness: Alert SpO2: 99 % O2 Device: Room Air Patient Activity (if Appropriate): In bed O2 Flow Rate (L/min): 5 L/min Assess: MEWS Score MEWS Temp: 0 MEWS Systolic: 1 MEWS Pulse: 0 MEWS RR: 0 MEWS LOC: 0 MEWS Score: 1 MEWS Score Color: Green Assess: SIRS CRITERIA SIRS Temperature : 0 SIRS Respirations : 0 SIRS Pulse: 1 SIRS WBC: 0 SIRS Score Sum : 1 Assess: if the MEWS score is Yellow or Red Were vital signs accurate and taken at a resting state?: Yes Does the patient meet 2 or more of the SIRS criteria?: Yes Does the patient have a confirmed or suspected source of infection?: Yes MEWS guidelines implemented : Yes, red Treat MEWS Interventions: Considered administering scheduled or prn medications/treatments as ordered Take Vital Signs Increase Vital Sign Frequency : Red: Q1hr x2, continue Q4hrs until patient remains green for 12hrs Escalate MEWS: Escalate: Red: Discuss with charge nurse and notify provider. Consider notifying RRT. If remains red for 2 hours consider need for higher level of care   Patient had witnessed fall @ 2210.  During the post fall protocol VS, patient's VS began to decline.  At 0247, his temperature was 101.6 and HR 116.  Patient had no c/o pain at this time.  MD made aware and came to bedside.  Tylenol  given @ 0317.  At 0411, temperature was 99.0, HR 101 - however, b/p now 87/60.  Patient had minimal c/o pain.  MD made aware and came to bedside to assess patient.  Per MD, gave patient H20 to drink.  Recheck of B/P @ 0514 was 87/50 (61).  MD made aware.  RED MEWS Protocol implemented.  Will continue to monitor patient.  Suzen Ice RN     Derek Thompson 11/26/2023, 5:17 AM

## 2023-11-26 NOTE — Progress Notes (Signed)
   11/26/23 0100  Provider Notification  Provider Name/Title Dr. Ingold/Dr. Nyugen  Date Provider Notified 11/25/23  Time Provider Notified 2315  Method of Notification Page  Notification Reason Fall  Provider response At bedside  Date of Provider Response 11/25/23  Follow Up  Family notified No - patient refusal  Additional tests No  Progress note created (see row info) Yes  Adult Fall Risk Assessment  Age 63  Fall History: Fall within 6 months prior to admission 0  Elimination; Bowel and/or Urine Incontinence 2  Elimination; Bowel and/or Urine Urgency/Frequency 0  Medications: includes PCA/Opiates, Anti-convulsants, Anti-hypertensives, Diuretics, Hypnotics, Laxatives, Sedatives, and Psychotropics 5  Patient Care Equipment 3 (Walker/Wound VAC/Coude Catheter)  Mobility-Assistance 2  Mobility-Gait 2  Mobility-Sensory Deficit 0  Altered awareness of immediate physical environment 0  Impulsiveness 0  Lack of understanding of one's physical/cognitive limitations 0  Total Score 15  Adult Fall Risk Interventions  Additional Interventions PT/OT need assessed if change in mobility from baseline;Use of appropriate toileting equipment (bedpan, BSC, etc.)  Screening for Fall Injury Risk (To be completed on HIGH fall risk patients) - Assessing Need for Floor Mats  Risk For Fall Injury- Criteria for Floor Mats Previous fall this admission;Bleeding risk-anticoagulation (not prophylaxis)  Neurological  Neuro (WDL) X  Level of Consciousness Alert  Orientation Level Oriented X4  Cognition Poor attention/concentration  Speech Clear  Musculoskeletal  Musculoskeletal (WDL) X  Assistive Device Front wheel walker  Generalized Weakness Yes  Weight Bearing Restrictions Per Provider Order Yes  RLE Weight Bearing Per Provider Order NWB  Musculoskeletal Details  RLE BKA  Integumentary  Integumentary (WDL) X  Skin Color Appropriate for ethnicity  Skin Condition Dry;Flaky  Skin Integrity Other  (Comment) (Right BKA)  Skin Turgor Non-tenting   Patient had called RN to room - he was requesting pain medication for back pain.  His scheduled HS night medications including metoprolol  and 1 mg PO hydromorphone  were given @ 2156.  Immediately afterward, patient requested to be put on the [facility].  Due to his wound vac and Coude Catheter and new right BKA, I asked if he wanted to use the bed pan.  He refused and began to sit up on side of bed.  I requested help from the NT.  With the help of the NT and a front wheel walker, the patient was assisted onto the [facility]  He had already had an incontinent stool in the bed.  He stood from the [facility] with me in front for the NT to clean him.  At the end, he stepped forward to look at himself in the mirror.  He then took a step back but lost his balance.  I was unable to catch him d/t the wound vac and walker being in the way.  The NT was unable to assist patient to the floor.  He landed on his sacrum.  He did not hit his head.  He denied any pain except slightly to sacrum.  He was able to move all extremities.  He was assisted back to bed with the help of additional staff.  VS taken at that time were stable.  Dr. Nyugen and Dr. Marylu made aware.  MDs came to bedside to assess patient and at that time no additional orders were made.  Post Fall Protocol was implemented.

## 2023-11-27 DIAGNOSIS — Z89511 Acquired absence of right leg below knee: Secondary | ICD-10-CM

## 2023-11-27 DIAGNOSIS — E1151 Type 2 diabetes mellitus with diabetic peripheral angiopathy without gangrene: Secondary | ICD-10-CM | POA: Diagnosis not present

## 2023-11-27 DIAGNOSIS — M86171 Other acute osteomyelitis, right ankle and foot: Secondary | ICD-10-CM | POA: Diagnosis not present

## 2023-11-27 DIAGNOSIS — E440 Moderate protein-calorie malnutrition: Secondary | ICD-10-CM

## 2023-11-27 DIAGNOSIS — E1169 Type 2 diabetes mellitus with other specified complication: Secondary | ICD-10-CM | POA: Diagnosis not present

## 2023-11-27 DIAGNOSIS — R000 Tachycardia, unspecified: Secondary | ICD-10-CM | POA: Diagnosis not present

## 2023-11-27 LAB — BASIC METABOLIC PANEL WITH GFR
Anion gap: 9 (ref 5–15)
BUN: 56 mg/dL — ABNORMAL HIGH (ref 8–23)
CO2: 24 mmol/L (ref 22–32)
Calcium: 8.6 mg/dL — ABNORMAL LOW (ref 8.9–10.3)
Chloride: 96 mmol/L — ABNORMAL LOW (ref 98–111)
Creatinine, Ser: 2.36 mg/dL — ABNORMAL HIGH (ref 0.61–1.24)
GFR, Estimated: 30 mL/min — ABNORMAL LOW (ref >60)
Glucose, Bld: 91 mg/dL (ref 70–99)
Potassium: 4.9 mmol/L (ref 3.5–5.1)
Sodium: 129 mmol/L — ABNORMAL LOW (ref 135–145)

## 2023-11-27 LAB — CBC
HCT: 22.3 % — ABNORMAL LOW (ref 39.0–52.0)
Hemoglobin: 7.2 g/dL — ABNORMAL LOW (ref 13.0–17.0)
MCH: 28.1 pg (ref 26.0–34.0)
MCHC: 32.3 g/dL (ref 30.0–36.0)
MCV: 87.1 fL (ref 80.0–100.0)
Platelets: 306 K/uL (ref 150–400)
RBC: 2.56 MIL/uL — ABNORMAL LOW (ref 4.22–5.81)
RDW: 17.6 % — ABNORMAL HIGH (ref 11.5–15.5)
WBC: 6.6 K/uL (ref 4.0–10.5)
nRBC: 0 % (ref 0.0–0.2)

## 2023-11-27 LAB — GLUCOSE, CAPILLARY
Glucose-Capillary: 102 mg/dL — ABNORMAL HIGH (ref 70–99)
Glucose-Capillary: 206 mg/dL — ABNORMAL HIGH (ref 70–99)
Glucose-Capillary: 156 mg/dL — ABNORMAL HIGH (ref 70–99)
Glucose-Capillary: 65 mg/dL — ABNORMAL LOW (ref 70–99)
Glucose-Capillary: 251 mg/dL — ABNORMAL HIGH (ref 70–99)

## 2023-11-27 NOTE — Progress Notes (Signed)
 IVT consult placed for new PIV. Patient requested IVT return later for placement - stated he didn't know why he needed one. Explained that he is receiving IV abx however he stated he wanted team to return later today. Primary RN notified and requested to place new consult closer to medication admin time. RN confirmed - will follow up as needed.   Yosgart Pavey R Mikayla Chiusano, RN

## 2023-11-27 NOTE — Progress Notes (Signed)
 HD#24 SUBJECTIVE:  Patient Summary: Derek Thompson is a 63 y.o. with a pertinent PMH of.   Overnight Events: ***  Interim History: ***  OBJECTIVE:  Vital Signs: Vitals:   11/27/23 0519 11/27/23 0758 11/27/23 1718 11/27/23 2000  BP: (!) 87/55 93/60 117/75 106/69  Pulse: 78 77 (!) 111 (!) 112  Resp: 18 19 19 18   Temp: (!) 97.5 F (36.4 C) 98.1 F (36.7 C) 98.3 F (36.8 C) 99.6 F (37.6 C)  TempSrc: Oral Oral  Oral  SpO2: 100% 100% 100% 99%  Weight: 59.4 kg     Height:       Supplemental O2: {NAMES:3044014::Room Air,Nasal Cannula,Simple Face Mask,Partial Rebreather,HFNC,Non Rebreather,Venturi Mask,Bag Valve Mask} SpO2: 99 % O2 Flow Rate (L/min): 5 L/min  Filed Weights   11/25/23 0632 11/26/23 0514 11/27/23 0519  Weight: 59.5 kg 61 kg 59.4 kg     Intake/Output Summary (Last 24 hours) at 11/27/2023 2147 Last data filed at 11/27/2023 2040 Gross per 24 hour  Intake 840 ml  Output 2525 ml  Net -1685 ml   Net IO Since Admission: -22,019.19 mL [11/27/23 2147]  Physical Exam: Physical Exam  Patient Lines/Drains/Airways Status     Active Line/Drains/Airways     Name Placement date Placement time Site Days   Negative Pressure Wound Therapy Knee Right 11/17/23  1024  --  10   Urethral Catheter Mekides Nida, RN Coude 16 Fr. 11/21/23  1522  Coude  6   Wound 11/16/23 0826 Surgical  Sacrum Left 11/16/23  0826  Sacrum  11   Wound 11/17/23 1030 Surgical Closed Surgical Incision Knee Right 11/17/23  1030  Knee  10            Pertinent labs and imaging:  ***    Latest Ref Rng & Units 11/27/2023    6:05 AM 11/26/2023   10:24 AM 11/25/2023    4:13 AM  CBC  WBC 4.0 - 10.5 K/uL 6.6  5.7  6.7   Hemoglobin 13.0 - 17.0 g/dL 7.2  7.9  7.5   Hematocrit 39.0 - 52.0 % 22.3  25.7  23.6   Platelets 150 - 400 K/uL 306  343  314        Latest Ref Rng & Units 11/27/2023    6:05 AM 11/26/2023   10:24 AM 11/25/2023    4:13 AM  CMP  Glucose 70 - 99 mg/dL 91   761  73   BUN 8 - 23 mg/dL 56  61  55   Creatinine 0.61 - 1.24 mg/dL 7.63  7.48  7.65   Sodium 135 - 145 mmol/L 129  129  130   Potassium 3.5 - 5.1 mmol/L 4.9  5.6  5.7   Chloride 98 - 111 mmol/L 96  97  98   CO2 22 - 32 mmol/L 24  21  21    Calcium  8.9 - 10.3 mg/dL 8.6  8.8  8.6     No results found.  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 Discussed with the resident and agree with the resident's findings and plan of care as documented in the resident's note. Pt doing well post R BKA 7-19. WIll have PT see pt as his placement in SNF/Rehab may take some time. He'll get outpt f/u for his gammopathy.    Eben Reyes BROCKS, MD                   HD#24 SUBJECTIVE:  Patient Summary: Derek Thompson is a 62 y.o. with a pertinent PMH of t PMH of End-stage HFrEF, CKD, CVA, bladder outlet obstruction s/p coude foley catheter during last admission 10/2023, and chronic osteomyelitis , admitted hypovolemic hyponatremia and AKI with coude foley and on CTX, improving.   Overnight Events: N/A   Interim History: he was doing great, just had concern for his insurance, no pain on amputaed site or back pain,no dysuria, last day of     OBJECTIVE:  Vital Signs:       Vitals:    11/26/23 1948 11/27/23 0209 11/27/23 0519 11/27/23 0758  BP: 105/63 (!) 98/58 (!) 87/55 93/60  Pulse: 94 95 78 77  Resp: 18 17 18 19   Temp: 98.7 F (37.1 C) 98.6 F (37 C) (!) 97.5 F (36.4 C) 98.1 F (36.7 C)  TempSrc: Oral Oral Oral Oral  SpO2: 100% 99% 100% 100%  Weight:     59.4 kg    Height:            Supplemental O2: Room Air SpO2: 100 % O2 Flow Rate (L/min): 5 L/min        Filed Weights    11/25/23 0632 11/26/23 0514 11/27/23 0519  Weight: 59.5 kg 61 kg 59.4 kg        Intake/Output Summary (Last 24 hours) at 11/27/2023 1542 Last data filed at 11/27/2023 1507    Gross per 24 hour  Intake 680 ml  Output 2075 ml  Net -1395 ml    Net IO Since Admission: -21,569.19 mL [11/27/23 1542]   Physical Exam: Physical Exam   Patient Lines/Drains/Airways Status       Active Line/Drains/Airways       Name Placement date Placement time Site Days    Peripheral IV 11/22/23 Left;Posterior Forearm 11/22/23  1657  Forearm  5    Negative Pressure Wound Therapy Knee Right 11/17/23  1024  --  10    Urethral Catheter Mekides Nida, RN Coude 16 Fr. 11/21/23  1522  Coude  6    Wound 11/16/23 0826 Surgical  Sacrum Left 11/16/23  0826  Sacrum  11    Wound 11/17/23 1030 Surgical Closed Surgical Incision Knee Right 11/17/23  1030  Knee  10                  Pertinent labs and imaging:        Latest Ref Rng & Units 11/27/2023    6:05 AM 11/26/2023   10:24 AM 11/25/2023    4:13 AM  CBC  WBC 4.0 - 10.5 K/uL 6.6  5.7  6.7   Hemoglobin 13.0 - 17.0 g/dL 7.2  7.9  7.5   Hematocrit 39.0 - 52.0 % 22.3  25.7  23.6   Platelets 150 - 400 K/uL 306  343  314           Latest Ref Rng & Units 11/27/2023    6:05 AM 11/26/2023   10:24 AM 11/25/2023    4:13 AM  CMP  Glucose 70 - 99 mg/dL 91  761  73   BUN 8 - 23 mg/dL 56  61  55   Creatinine 0.61 - 1.24 mg/dL 7.63  7.48  7.65   Sodium 135 - 145 mmol/L 129  129  130   Potassium 3.5 - 5.1 mmol/L  4.9  5.6  5.7   Chloride 98 - 111 mmol/L 96  97  98   CO2 22 - 32 mmol/L 24  21  21    Calcium  8.9 - 10.3 mg/dL 8.6  8.8  8.6       No results found.   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 with a pertinent PMH of End-stage HFrEF, CKD, CVA, bladder outlet obstruction s/p coude foley catheter during last admission 10/2023, and chronic osteomyelitis , admitted hypovolemic hyponatremia and AKI with coude foley and on CTX, improving   Overnight Events: Pt experienced assisted fall to the ground while using the bedside commode. Per report, no concern for head injury and pt denying symptoms. He also became febrile again last night with hypotension.    Interim History: Pt seen and examined at the bedside this morning. He described his fall last night stating that he lost his balanced and he should've zigged when he zagged causing him to fall on his bum. He also described often waking in bed unknowingly having soiled himself. This has been an issue for multiple years.    OBJECTIVE:  Vital Signs:            Vitals:    11/26/23 0247 11/26/23 0411 11/26/23 0514 11/26/23 0720  BP: 108/72 (!) 87/60 (!) 87/50 107/65  Pulse: (!) 116 (!) 101 96 89  Resp: 19 18 18 18   Temp: (!) 101.6 F (38.7 C) 99 F (37.2 C)    98.5 F (36.9 C)  TempSrc: Oral Oral Oral Oral  SpO2: 97% 97% 99% 100%  Weight:     61 kg    Height:            Supplemental O2: Room Air              Filed Weights    11/20/23 0436 11/25/23 0632 11/26/23 0514  Weight: 66.4 kg 59.5 kg 61 kg        Intake/Output Summary (Last 24 hours) at 11/26/2023 0733 Last data filed at 11/26/2023 0413  Gross per 24 hour  Intake 1120 ml  Output 840 ml  Net 280 ml    Net IO Since Admission: -19,524.19 mL [11/26/23 0733]   Physical Exam: Const: Awake, alert in NAD HENT: Normocephalic, atraumatic, Card: RRR, No pitting edema on LE's bilaterally  Resp: LCTAB, No increased work of breathing Abd: NTND, No suprapubic tenderness noted.  Extremities: RLE BKA with would vac and rigid BKA protector in place.    Patient Lines/Drains/Airways Status       Active Line/Drains/Airways       Name Placement date Placement time Site Days    Peripheral IV 11/03/23 22 G 1.75 Left;Proximal Forearm 11/03/23  0945  Forearm  1    Peripheral IV 11/04/23 20 G 1 Posterior;Right Forearm 11/04/23  0845  Forearm  less than 1    Urethral Catheter Mekides Nida, RN Coude 16 Fr. 11/03/23  1034  Coude  1    Wound / Incision (Open or Dehisced) 09/01/23 Non-pressure wound;Amputation Foot Anterior;Right amputation of right big toe in february, reddened foot area with purulent drainage present 09/01/23  1721  Foot  64    Wound 11/02/23 1655 Vascular Ulcer Foot Anterior;Right 11/02/23  1655  Foot  2    Wound 11/02/23 1658 Vascular Ulcer Foot Right;Lateral 11/02/23  1658  Foot  2                  Pertinent labs and imaging:        Latest Ref Rng & Units 11/26/2023   10:24 AM 11/25/2023    4:13 AM 11/24/2023    4:47 AM  CBC  WBC 4.0 - 10.5 K/uL 5.7  6.7  6.7   Hemoglobin 13.0 - 17.0 g/dL 7.9  7.5  8.0   Hematocrit 39.0 - 52.0 % 25.7  23.6  24.6   Platelets 150 - 400 K/uL 343  314  327           Latest Ref Rng & Units 11/26/2023   10:24 AM 11/25/2023     4:13 AM 11/24/2023    4:47 AM  CMP  Glucose 70 - 99 mg/dL 761  73  834   BUN 8 - 23 mg/dL 61  55  56   Creatinine 0.61 - 1.24 mg/dL 7.48  7.65  7.57   Sodium 135 - 145 mmol/L 129  130  128   Potassium 3.5 - 5.1 mmol/L 5.6  5.7  5.5   Chloride 98 - 111 mmol/L 97  98  97   CO2 22 - 32 mmol/L 21  21  21    Calcium  8.9 - 10.3 mg/dL 8.8  8.6  8.6     No results found.         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 Urine culture returned  negative but was collected after antibiotics were started. Original urinalysis was obtained from a catheterized sample. A Coude catheter has been in place since 7/22. No pyuria observed in the urine bag.  Plan: Last day of Ceftriaxone  to complete 7-day course  Maintain Coude catheter; may remain for up to 30 days per urology guidance Monitor for signs of infection or clinical changes Follow-up urine studies if clinically indicated        Bowel Incontinence S/p prostatic EBRT   Patient reports frequent morning episodes of incontinence, a chronic issue likely linked to recurrent UTIs. he was on prophylactic nitrofurantoin during cancer treatment, including external beam radiation therapy in 2022. Nitrofurantoin use may contribute to persistent incontinence. Neurogenic causes are currently unlikely.   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 Harden. Denies pain at surgical site. -Wound vac in place until follow up with Dr. Harden outpatient.   Elevated Protein gap  SPEP with Positive M-Spike SPEP shows gamma globulin of 2.1 and an M-spike of 1.1. The kappa/lambda light chain ratio is 0.58. IgG is elevated at 2,664 with lambda specificity. UPEP reveals positive Bence Jones protein. Beta-2 microglobulin is elevated at 7.2. Bone marrow biopsy from 7/17 indicates CD56 positivity in a subset of aberrant plasma cells, consistent with a neoplastic process. These findings are suggestive of multiple myeloma. Hematology (Dr. Autumn) has been consulted and will manage the patient on an outpatient basis. This diagnosis likely explains the patient's ongoing issues, including renal dysfunction and electrolyte abnormalities.  Plan:  -Continue outpatient follow-up with hematology (Dr. Autumn) -Monitor renal function and electrolytes closely -Supportive care as needed for renal impairment -Educate patient and family regarding diagnosis and treatment plan -Arrange appropriate referrals and diagnostic work-up per hematology recommendations       Hyponatremia Sodium is 129 Likely multifactorial, including cardiorenal syndrome, AKI, bladder outlet obstruction, and hypovolemia.  -High suspicion for pseudohyponatremia due to presence of monoclonal gammopathy.    Debility Functional Deconditioning Patient is currently hospitalized day 25 with significantly reduced mobility, spending most of the time in bed.  -Postoperative day 9 following right below-knee amputation (BKA). -Continue physical and occupational therapy. -Patient had a ground-level fall last night while using a walker; no injuries noted. -Will monitor closely for symptoms such as pain, neurological changes, or hematoma formation. AKI Bladder outlet obstruction with BL hydroureter NAGMA Since admission, AKI has  significantly improved. Developed post-ATN diuresis, Will keep coude catheter in place. Per urology, needs to be replaced every 30 days.  -Renal function likely impacted by monoclonal gammopathy of renal significance. Hematology following -Sodium Bicarb 1300 to TID for persistent NAGMA. Bicarb 21.1 today   Biventricular HFrEF EF 20-25%.  Persistent Tacchycardia Hx of nonobstructive CAD. Nonadherent to medications. Not a candidate for advanced therapies. Suspicion of prior MI but none documented in the chart. Remains peripherally euvolemic.  - Monitor electrolytes; goal Mg 2, K ~4. - Monitor urine output. - Continue metoprolol  25mg  BID   T2DM -BG 73-274 -Continue SSI and 15 semglee    Hx of LV thrombus (not visualized on TTE and MR 10/2023) New subscapular splenic infarct -Contine Eliquis  5mg  BID   Acute on chronic anemia -Low sat ratios during last hospitalization.  -S/p IV Iron  7/8. Will continue daily CBC's. Transfuse if Hbg <7     Multisystem organ failure Goals of care Palliative Consult Severe protein calorie malnutrition -Discussed multisystem organ failure and recurrent hospitalizations -Palliative care saw pt and pt expressed desire to continue  to be full code, full scope.  -At this time, he and his family wish to continue aggressive care. -Per PT eval, Pt to d/c to SNF, waiting for insurance         Best Practice: Diet: Renal diet IVF: None VTE: Eliquis  5 mg BID Code: Full  Disposition planning: Therapy Recs: {NAMES:3044014::None,Pending,CIR,SNF,ALF,LTAC,Home Health}, DME: {Assistive Devices IFZ:80464} Family Contact: ***, {Family:[id number]::to be notified.} DISPO: Anticipated discharge in 4 days to Skilled nursing facility pending wait for insurance.  Signature:  Derek [facility]  9:47 PM, 11/27/2023  On Call pager [phone]

## 2023-11-27 NOTE — Progress Notes (Signed)
 Physical Therapy Treatment Patient Details Name: Derek Thompson MRN: [id number] DOB: 1961/03/25 Today's Date: 11/27/2023   History of Present Illness Pt is a 63 y/o M presenting to ED on 11/02/23 with lethargy. Pt found to be tachycardic, hyperkalemic, and hyponatremic. 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    PT Comments  Continuing work on functional mobility and activity tolerance;  session focused on continuing gait efforts, and therex aimed at hip and knee extension range and strength in prep for getting a prosthesis; Good participation, and pt is motivated to get a prosthesis; needs continuing work on strengthening and gait; Seems to like to stay laying in bed much of the time; will need to encourage pt to start spending more time upright and OOB    If plan is discharge home, recommend the following: A lot of help with bathing/dressing/bathroom;Supervision due to cognitive status;Help with stairs or ramp for entrance;A lot of help with walking and/or transfers;Assistance with cooking/housework;Assist for transportation   Can travel by private vehicle     No  Equipment Recommendations  Rolling walker (2 wheels);BSC/3in1;Wheelchair (measurements PT)    Recommendations for Other Services       Precautions / Restrictions Precautions Precautions: Fall;Other (comment) Recall of Precautions/Restrictions: Impaired Precaution/Restrictions Comments: R limb guard protector, wound vac Restrictions RLE Weight Bearing Per Provider Order: Non weight bearing Other Position/Activity Restrictions: has a post op shoe for R foot     Mobility  Bed Mobility Overal bed mobility: Needs Assistance Bed Mobility: Supine to Sit, Sit  to Supine Rolling: Supervision   Supine to sit: Contact guard Sit to supine: Supervision   General bed mobility comments: Slow-moving, cues to sit fully upright and to square off hips at EOB    Transfers Overall transfer level: Needs assistance Equipment used: Rolling walker (2 wheels) Transfers: Sit to/from Stand Sit to Stand: Min assist, From elevated surface           General transfer comment: cues for technique, hand placement for safety. lifting assistance required, as well as assist for steadying the RW    Ambulation/Gait Ambulation/Gait assistance: Min assist Gait Distance (Feet): 6 Feet Assistive device: Rolling walker (2 wheels) Gait Pattern/deviations: Decreased step length - left (Hop-to pattern) Gait velocity: decreased     General Gait Details: shows good support for lLE advancement with bil UEs on RW, and does not need to depend on pushing off with LLE for hopping; Still becomes very fatigued after about 5-6 steps, and needs to have a plan for sitting; will consider chair follow next session   Stairs             Wheelchair Mobility     Tilt Bed    Modified Rankin (Stroke Patients Only)       Balance     Sitting balance-Leahy Scale: Good       Standing balance-Leahy Scale: Poor Standing balance comment: heavy relaince on rolling walker. increased assistance for dynamic activity                            Communication Communication Communication: No apparent difficulties  Cognition Arousal: Alert Behavior During Therapy: [facility] for tasks assessed/performed  PT - Cognition Comments: decreased awareness of deficits and need for assistance. Following commands: Intact      Cueing Cueing Techniques: Verbal cues, Tactile cues  Exercises Amputee Exercises Quad Sets: AROM, Right, 10 reps Other Exercises Other Exercises: Bolsterd bridging RLE x10 reps Other Exercises: standing hip extension  with RW, 10 reps Other Exercises: R hip extension in L sidelying 5 reps    General Comments General comments (skin integrity, edema, etc.): Tells me he is from a shelter, and stated that they do not have steps to enter      Pertinent Vitals/Pain Pain Assessment Pain Assessment: Faces Faces Pain Scale: Hurts a little bit Pain Location: lower back; R calf Pain Descriptors / Indicators: Discomfort, Grimacing, Guarding, Burning Pain Intervention(s): Monitored during session    Home Living                          Prior Function            PT Goals (current goals can now be found in the care plan section) Acute Rehab PT Goals Patient Stated Goal: to be more independent PT Goal Formulation: With patient Time For Goal Achievement: 12/02/23 Potential to Achieve Goals: Good Progress towards PT goals: Progressing toward goals    Frequency    Min 2X/week      PT Plan      Co-evaluation              AM-PAC PT 6 Clicks Mobility   Outcome Measure  Help needed turning from your back to your side while in a flat bed without using bedrails?: A Little Help needed moving from lying on your back to sitting on the side of a flat bed without using bedrails?: A Little Help needed moving to and from a bed to a chair (including a wheelchair)?: A Little Help needed standing up from a chair using your arms (e.g., wheelchair or bedside chair)?: A Little Help needed to walk in hospital room?: A Lot Help needed climbing 3-5 steps with a railing? : Total 6 Click Score: 15    End of Session Equipment Utilized During Treatment: Gait belt Activity Tolerance: Patient tolerated treatment well Patient left: in bed;with call bell/phone within reach;with bed alarm set Nurse Communication: Mobility status PT Visit Diagnosis: Unsteadiness on feet (R26.81);Difficulty in walking, not elsewhere classified (R26.2);Other abnormalities of gait and mobility (R26.89) Pain - Right/Left:  Right Pain - part of body: Ankle and joints of foot     Time: 1224-1253 PT Time Calculation (min) (ACUTE ONLY): 29 min  Charges:    $Gait Training: 8-22 mins $Therapeutic Exercise: 8-22 mins PT General Charges $$ ACUTE PT VISIT: 1 Visit                     [name], PT  Acute Rehabilitation Services Office [phone] Secure Chat welcomed    [name] 11/27/2023, 3:15 PM

## 2023-11-27 NOTE — Progress Notes (Addendum)
 HD#24 SUBJECTIVE:  Patient Summary: Derek Thompson is a 63 y.o. with a pertinent PMH of t PMH of End-stage HFrEF, CKD, CVA, bladder outlet obstruction s/p coude foley catheter during last admission 10/2023, and chronic osteomyelitis , admitted hypovolemic hyponatremia and AKI with coude foley and on CTX, improving.  Overnight Events: N/A  Interim History: he was doing great, just had concern for his insurance, no pain on amputaed site or back pain,no dysuria, last day of   OBJECTIVE:  Vital Signs: Vitals:   11/26/23 1948 11/27/23 0209 11/27/23 0519 11/27/23 0758  BP: 105/63 (!) 98/58 (!) 87/55 93/60  Pulse: 94 95 78 77  Resp: 18 17 18 19   Temp: 98.7 F (37.1 C) 98.6 F (37 C) (!) 97.5 F (36.4 C) 98.1 F (36.7 C)  TempSrc: Oral Oral Oral Oral  SpO2: 100% 99% 100% 100%  Weight:   59.4 kg   Height:       Supplemental O2: Room Air SpO2: 100 % O2 Flow Rate (L/min): 5 L/min  Filed Weights   11/25/23 0632 11/26/23 0514 11/27/23 0519  Weight: 59.5 kg 61 kg 59.4 kg     Intake/Output Summary (Last 24 hours) at 11/27/2023 1542 Last data filed at 11/27/2023 1507 Gross per 24 hour  Intake 680 ml  Output 2075 ml  Net -1395 ml   Net IO Since Admission: -21,569.19 mL [11/27/23 1542]  Physical Exam: Physical Exam  Patient Lines/Drains/Airways Status     Active Line/Drains/Airways     Name Placement date Placement time Site Days   Peripheral IV 11/22/23 Left;Posterior Forearm 11/22/23  1657  Forearm  5   Negative Pressure Wound Therapy Knee Right 11/17/23  1024  --  10   Urethral Catheter Mekides Nida, RN Coude 16 Fr. 11/21/23  1522  Coude  6   Wound 11/16/23 0826 Surgical  Sacrum Left 11/16/23  0826  Sacrum  11   Wound 11/17/23 1030 Surgical Closed Surgical Incision Knee Right 11/17/23  1030  Knee  10            Pertinent labs and imaging:      Latest Ref Rng & Units 11/27/2023    6:05 AM 11/26/2023   10:24 AM 11/25/2023    4:13 AM  CBC  WBC 4.0 - 10.5 K/uL  6.6  5.7  6.7   Hemoglobin 13.0 - 17.0 g/dL 7.2  7.9  7.5   Hematocrit 39.0 - 52.0 % 22.3  25.7  23.6   Platelets 150 - 400 K/uL 306  343  314        Latest Ref Rng & Units 11/27/2023    6:05 AM 11/26/2023   10:24 AM 11/25/2023    4:13 AM  CMP  Glucose 70 - 99 mg/dL 91  761  73   BUN 8 - 23 mg/dL 56  61  55   Creatinine 0.61 - 1.24 mg/dL 7.63  7.48  7.65   Sodium 135 - 145 mmol/L 129  129  130   Potassium 3.5 - 5.1 mmol/L 4.9  5.6  5.7   Chloride 98 - 111 mmol/L 96  97  98   CO2 22 - 32 mmol/L 24  21  21    Calcium  8.9 - 10.3 mg/dL 8.6  8.8  8.6     No results found.  ASSESSMENT/PLAN:  Assessment: Principal Problem:   Osteomyelitis of right foot (HCC) Active Problems:   CAD (coronary artery disease)   Hyperkalemia   AKI (acute kidney  injury) (HCC)   Anemia   LV (left ventricular) mural thrombus without MI (HCC)   Gangrene of right foot (HCC)   Hyponatremia   HFrEF (heart failure with reduced ejection fraction) (HCC)   Chronic osteomyelitis involving lower leg, right (HCC)   Frailty   Urinary retention   Generalized weakness   Sinus tachycardia   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 (HCC)   Chronic anemia   Hypoalbuminemia   Monoclonal gammopathy   Hx of right BKA (HCC)   Plan:     HD#23 SUBJECTIVE:  Patient Summary: Derek Thompson is a 63 y.o. with a pertinent PMH of End-stage HFrEF, CKD, CVA, bladder outlet obstruction s/p coude foley catheter during last admission 10/2023, and chronic osteomyelitis , admitted hypovolemic hyponatremia and AKI with coude foley and on CTX, improving   Overnight Events: Pt experienced assisted fall to the ground while using the bedside commode. Per report, no concern for head injury and pt denying symptoms. He also became febrile again last night with hypotension.    Interim History: Pt seen and examined at the bedside this morning. He described his fall last night stating that he lost his balanced and he  should've zigged when he zagged causing him to fall on his bum. He also described often waking in bed unknowingly having soiled himself. This has been an issue for multiple years.    OBJECTIVE:  Vital Signs:       Vitals:    11/26/23 0247 11/26/23 0411 11/26/23 0514 11/26/23 0720  BP: 108/72 (!) 87/60 (!) 87/50 107/65  Pulse: (!) 116 (!) 101 96 89  Resp: 19 18 18 18   Temp: (!) 101.6 F (38.7 C) 99 F (37.2 C)   98.5 F (36.9 C)  TempSrc: Oral Oral Oral Oral  SpO2: 97% 97% 99% 100%  Weight:     61 kg    Height:            Supplemental O2: Room Air          Filed Weights    11/20/23 0436 11/25/23 0632 11/26/23 0514  Weight: 66.4 kg 59.5 kg 61 kg        Intake/Output Summary (Last 24 hours) at 11/26/2023 0733 Last data filed at 11/26/2023 0413    Gross per 24 hour  Intake 1120 ml  Output 840 ml  Net 280 ml    Net IO Since Admission: -19,524.19 mL [11/26/23 0733]   Physical Exam: Const: Awake, alert in NAD HENT: Normocephalic, atraumatic, Card: RRR, No pitting edema on LE's bilaterally  Resp: LCTAB, No increased work of breathing Abd: NTND, No suprapubic tenderness noted.  Extremities: RLE BKA with would vac and rigid BKA protector in place.    Patient Lines/Drains/Airways Status       Active Line/Drains/Airways       Name Placement date Placement time Site Days    Peripheral IV 11/03/23 22 G 1.75 Left;Proximal Forearm 11/03/23  0945  Forearm  1    Peripheral IV 11/04/23 20 G 1 Posterior;Right Forearm 11/04/23  0845  Forearm  less than 1    Urethral Catheter Mekides Nida, RN Coude 16 Fr. 11/03/23  1034  Coude  1    Wound / Incision (Open or Dehisced) 09/01/23 Non-pressure wound;Amputation Foot Anterior;Right amputation of right big toe in february, reddened foot area with purulent drainage present 09/01/23  1721  Foot  64    Wound 11/02/23 1655 Vascular Ulcer Foot Anterior;Right 11/02/23  1655  Foot  2    Wound 11/02/23 1658 Vascular Ulcer Foot Right;Lateral  11/02/23  1658  Foot  2                  Pertinent labs and imaging:        Latest Ref Rng & Units 11/26/2023   10:24 AM 11/25/2023    4:13 AM 11/24/2023    4:47 AM  CBC  WBC 4.0 - 10.5 K/uL 5.7  6.7  6.7   Hemoglobin 13.0 - 17.0 g/dL 7.9  7.5  8.0   Hematocrit 39.0 - 52.0 % 25.7  23.6  24.6   Platelets 150 - 400 K/uL 343  314  327           Latest Ref Rng & Units 11/26/2023   10:24 AM 11/25/2023    4:13 AM 11/24/2023    4:47 AM  CMP  Glucose 70 - 99 mg/dL 761  73  834   BUN 8 - 23 mg/dL 61  55  56   Creatinine 0.61 - 1.24 mg/dL 7.48  7.65  7.57   Sodium 135 - 145 mmol/L 129  130  128   Potassium 3.5 - 5.1 mmol/L 5.6  5.7  5.5   Chloride 98 - 111 mmol/L 97  98  97   CO2 22 - 32 mmol/L 21  21  21    Calcium  8.9 - 10.3 mg/dL 8.8  8.6  8.6     No results found.         ASSESSMENT/PLAN:  Assessment: Principal Problem:   Osteomyelitis of right foot (HCC) Active Problems:   CAD (coronary artery disease)   Hyperkalemia   AKI (acute kidney injury) (HCC)   Anemia   LV (left ventricular) mural thrombus without MI (HCC)   Gangrene of right foot (HCC)   Hyponatremia   HFrEF (heart failure with reduced ejection fraction) (HCC)   Chronic osteomyelitis involving lower leg, right (HCC)   Frailty   Urinary retention   Generalized weakness   Sinus tachycardia   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 (HCC)   Chronic anemia   Hypoalbuminemia   Monoclonal gammopathy     Plan:   CAUTI Patient reports no dysuria or fever. Urine culture returned negative but was collected after antibiotics were started. Original urinalysis was obtained from a catheterized sample. A Coude catheter has been in place since 7/22. No pyuria observed in the urine bag.  Plan: Last day of Ceftriaxone  to complete 7-day course  Maintain Coude catheter; may remain for up to 30 days per urology guidance Monitor for signs of infection or clinical changes Follow-up urine studies if  clinically indicated      Bowel Incontinence S/p prostatic EBRT  Patient reports frequent morning episodes of incontinence, a chronic issue likely linked to recurrent UTIs. he was on prophylactic nitrofurantoin during cancer treatment, including external beam radiation therapy in 2022. Nitrofurantoin use may contribute to persistent incontinence. Neurogenic causes are currently unlikely.   Chronic osteomyelitis R foot PAD S/P BKA 11/17/23 -POD 10 R BKA with Dr. Harden. Denies pain at surgical site. -Wound vac in place until follow up with Dr. Harden outpatient.   Elevated Protein gap  SPEP with Positive M-Spike SPEP shows gamma globulin of 2.1 and an M-spike of 1.1. The kappa/lambda light chain ratio is 0.58. IgG is elevated at 2,664 with lambda specificity. UPEP reveals positive Bence Jones protein. Beta-2 microglobulin is elevated at 7.2. Bone  marrow biopsy from 7/17 indicates CD56 positivity in a subset of aberrant plasma cells, consistent with a neoplastic process. These findings are suggestive of multiple myeloma. Hematology (Dr. Autumn) has been consulted and will manage the patient on an outpatient basis. This diagnosis likely explains the patient's ongoing issues, including renal dysfunction and electrolyte abnormalities.  Plan:  -Continue outpatient follow-up with hematology (Dr. Autumn) -Monitor renal function and electrolytes closely -Supportive care as needed for renal impairment -Educate patient and family regarding diagnosis and treatment plan -Arrange appropriate referrals and diagnostic work-up per hematology recommendations      Hyponatremia Sodium is 129 Likely multifactorial, including cardiorenal syndrome, AKI, bladder outlet obstruction, and hypovolemia.  -High suspicion for pseudohyponatremia due to presence of monoclonal gammopathy.    Debility Functional Deconditioning Patient is currently hospitalized day 25 with significantly reduced mobility, spending most of  the time in bed.  -Postoperative day 9 following right below-knee amputation (BKA). -Continue physical and occupational therapy. -Patient had a ground-level fall last night while using a walker; no injuries noted. -Will monitor closely for symptoms such as pain, neurological changes, or hematoma formation. AKI Bladder outlet obstruction with BL hydroureter NAGMA Since admission, AKI has significantly improved. Developed post-ATN diuresis, Will keep coude catheter in place. Per urology, needs to be replaced every 30 days.  -Renal function likely impacted by monoclonal gammopathy of renal significance. Hematology following -Sodium Bicarb 1300 to TID for persistent NAGMA. Bicarb 21.1 today   Biventricular HFrEF EF 20-25%.  Persistent Tacchycardia Hx of nonobstructive CAD. Nonadherent to medications. Not a candidate for advanced therapies. Suspicion of prior MI but none documented in the chart. Remains peripherally euvolemic.  - Monitor electrolytes; goal Mg 2, K ~4. - Monitor urine output. - Continue metoprolol  25mg  BID   T2DM -BG 73-274 -Continue SSI and 15 semglee    Hx of LV thrombus (not visualized on TTE and MR 10/2023) New subscapular splenic infarct -Contine Eliquis  5mg  BID   Acute on chronic anemia -Low sat ratios during last hospitalization.  -S/p IV Iron  7/8. Will continue daily CBC's. Transfuse if Hbg <7     Multisystem organ failure Goals of care Palliative Consult Severe protein calorie malnutrition -Discussed multisystem organ failure and recurrent hospitalizations -Palliative care saw pt and pt expressed desire to continue to be full code, full scope.  -At this time, he and his family wish to continue aggressive care. -Per PT eval, Pt to d/c to SNF, waiting for insurance     Best Practice: Diet: Renal diet IVF: None VTE: [place] Code: Full  Disposition planning: DISPO: Anticipated discharge in 5 days to Skilled nursing facility pending    Signature:  Sheriann Newmann [facility] [ 3:42 PM, 11/27/2023  On Call pager [phone]

## 2023-11-27 NOTE — Progress Notes (Signed)
Hypoglycemic Event  CBG: ***  Treatment: {Hypoglycemic Treatment (must also place order and document administration):3049002}  Symptoms: {Hypoglycemic Symptoms:3049003}  Follow-up CBG: Time:*** CBG Result:***  Possible Reasons for Event: {Possible Reasons for Event:3049004}  Comments/MD notified:***    Charelle Petrakis Joselita

## 2023-11-28 DIAGNOSIS — M86171 Other acute osteomyelitis, right ankle and foot: Secondary | ICD-10-CM | POA: Diagnosis not present

## 2023-11-28 DIAGNOSIS — R000 Tachycardia, unspecified: Secondary | ICD-10-CM | POA: Diagnosis not present

## 2023-11-28 DIAGNOSIS — E1169 Type 2 diabetes mellitus with other specified complication: Secondary | ICD-10-CM | POA: Diagnosis not present

## 2023-11-28 DIAGNOSIS — E1151 Type 2 diabetes mellitus with diabetic peripheral angiopathy without gangrene: Secondary | ICD-10-CM | POA: Diagnosis not present

## 2023-11-28 LAB — BASIC METABOLIC PANEL WITH GFR
Anion gap: 8 (ref 5–15)
BUN: 57 mg/dL — ABNORMAL HIGH (ref 8–23)
CO2: 24 mmol/L (ref 22–32)
Calcium: 8.5 mg/dL — ABNORMAL LOW (ref 8.9–10.3)
Chloride: 97 mmol/L — ABNORMAL LOW (ref 98–111)
Creatinine, Ser: 2.27 mg/dL — ABNORMAL HIGH (ref 0.61–1.24)
GFR, Estimated: 31 mL/min — ABNORMAL LOW (ref >60)
Glucose, Bld: 115 mg/dL — ABNORMAL HIGH (ref 70–99)
Potassium: 5.3 mmol/L — ABNORMAL HIGH (ref 3.5–5.1)
Sodium: 129 mmol/L — ABNORMAL LOW (ref 135–145)

## 2023-11-28 LAB — CBC
HCT: 23.3 % — ABNORMAL LOW (ref 39.0–52.0)
Hemoglobin: 7.3 g/dL — ABNORMAL LOW (ref 13.0–17.0)
MCH: 27.2 pg (ref 26.0–34.0)
MCHC: 31.3 g/dL (ref 30.0–36.0)
MCV: 86.9 fL (ref 80.0–100.0)
Platelets: 306 K/uL (ref 150–400)
RBC: 2.68 MIL/uL — ABNORMAL LOW (ref 4.22–5.81)
RDW: 17.6 % — ABNORMAL HIGH (ref 11.5–15.5)
WBC: 6.2 K/uL (ref 4.0–10.5)
nRBC: 0 % (ref 0.0–0.2)

## 2023-11-28 LAB — GLUCOSE, CAPILLARY
Glucose-Capillary: 86 mg/dL (ref 70–99)
Glucose-Capillary: 184 mg/dL — ABNORMAL HIGH (ref 70–99)
Glucose-Capillary: 141 mg/dL — ABNORMAL HIGH (ref 70–99)
Glucose-Capillary: 174 mg/dL — ABNORMAL HIGH (ref 70–99)

## 2023-11-28 MED ORDER — SODIUM ZIRCONIUM CYCLOSILICATE 10 G PO PACK
10.0000 g | PACK | Freq: Once | ORAL | Status: AC
  Administered 2023-11-28: 10 g via ORAL
  Filled 2023-11-28: qty 1

## 2023-11-28 MED ORDER — INSULIN ASPART 100 UNIT/ML IJ SOLN
0.0000 [IU] | Freq: Three times a day (TID) | INTRAMUSCULAR | Status: DC
  Administered 2023-11-29 (×2): 1 [IU] via SUBCUTANEOUS
  Administered 2023-11-30 (×2): 2 [IU] via SUBCUTANEOUS
  Administered 2023-12-01: 1 [IU] via SUBCUTANEOUS
  Administered 2023-12-01: 7 [IU] via SUBCUTANEOUS
  Administered 2023-12-01 – 2023-12-02 (×3): 2 [IU] via SUBCUTANEOUS
  Administered 2023-12-03 (×2): 3 [IU] via SUBCUTANEOUS
  Administered 2023-12-03 – 2023-12-06 (×4): 1 [IU] via SUBCUTANEOUS
  Administered 2023-12-06: 2 [IU] via SUBCUTANEOUS
  Administered 2023-12-06 – 2023-12-07 (×2): 1 [IU] via SUBCUTANEOUS
  Administered 2023-12-08 (×2): 2 [IU] via SUBCUTANEOUS
  Administered 2023-12-09 (×2): 1 [IU] via SUBCUTANEOUS
  Administered 2023-12-10 (×2): 2 [IU] via SUBCUTANEOUS
  Administered 2023-12-12 (×4): 1 [IU] via SUBCUTANEOUS
  Administered 2023-12-12 (×2): 2 [IU] via SUBCUTANEOUS
  Administered 2023-12-13: 1 [IU] via SUBCUTANEOUS
  Administered 2023-12-13: 2 [IU] via SUBCUTANEOUS
  Administered 2023-12-13 (×2): 1 [IU] via SUBCUTANEOUS
  Administered 2023-12-13: 2 [IU] via SUBCUTANEOUS
  Administered 2023-12-13 – 2023-12-14 (×2): 1 [IU] via SUBCUTANEOUS
  Administered 2023-12-14: 2 [IU] via SUBCUTANEOUS
  Administered 2023-12-14: 5 [IU] via SUBCUTANEOUS
  Administered 2023-12-15: 7 [IU] via SUBCUTANEOUS
  Administered 2023-12-15 – 2023-12-16 (×3): 2 [IU] via SUBCUTANEOUS
  Administered 2023-12-16 (×2): 3 [IU] via SUBCUTANEOUS
  Administered 2023-12-17: 5 [IU] via SUBCUTANEOUS
  Administered 2023-12-17: 7 [IU] via SUBCUTANEOUS
  Administered 2023-12-18: 3 [IU] via SUBCUTANEOUS
  Administered 2023-12-18: 7 [IU] via SUBCUTANEOUS
  Administered 2023-12-19: 1 [IU] via SUBCUTANEOUS
  Administered 2023-12-19: 2 [IU] via SUBCUTANEOUS
  Administered 2023-12-20 (×2): 3 [IU] via SUBCUTANEOUS
  Administered 2023-12-21: 2 [IU] via SUBCUTANEOUS

## 2023-11-28 MED ORDER — INSULIN ASPART 100 UNIT/ML IJ SOLN
0.0000 [IU] | Freq: Every day | INTRAMUSCULAR | Status: DC
  Administered 2023-12-03: 3 [IU] via SUBCUTANEOUS
  Administered 2023-12-16: 2 [IU] via SUBCUTANEOUS
  Administered 2023-12-17 – 2023-12-18 (×2): 3 [IU] via SUBCUTANEOUS
  Administered 2023-12-23: 2 [IU] via SUBCUTANEOUS

## 2023-11-28 NOTE — TOC Progression Note (Signed)
 Transition of Care [facility]) - Progression Note    Patient Details  Name: Derek Thompson MRN: [id number] Date of Birth: 1961/01/06  Transition of Care [facility]) CM/SW Contact  Airika Alkhatib SHAUNNA [place], [name] Phone Number: 11/28/2023, 12:39 PM  Clinical Narrative:     Pt's son confirmed that they have provided Short Term disability Claim paperwork to pt's union rep. Though do not currently have an update on the status.   CSW sent referral to financial navigators to screen pt for medicaid and social security disability.   Expected Discharge Plan: Skilled Nursing Facility Barriers to Discharge: Inadequate or no insurance               Expected Discharge Plan and Services In-house Referral: Clinical Social Work   Post Acute Care Choice: Skilled Nursing Facility Living arrangements for the past 2 months: Homeless Shelter                                       Social Drivers of Health (SDOH) Interventions SDOH Screenings   Food Insecurity: No Food Insecurity (11/02/2023)  Housing: High Risk (11/02/2023)  Transportation Needs: Unmet Transportation Needs (11/02/2023)  Utilities: Not At Risk (11/02/2023)  Alcohol Screen: Low Risk  (08/19/2022)  Depression (PHQ2-9): Low Risk  (10/11/2023)  Financial Resource Strain: Low Risk  (08/19/2022)  Tobacco Use: Medium Risk (11/17/2023)    Readmission Risk Interventions     No data to display

## 2023-11-28 NOTE — Progress Notes (Signed)
 Physical Therapy Treatment Patient Details Name: Derek Thompson MRN: [id number] DOB: [birth year] Today's Date: 11/28/2023   History of Present Illness Pt is a 63 y/o M presenting to ED on 11/02/23 with lethargy. Pt found to be tachycardic, hyperkalemic, and hyponatremic. 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    PT Comments  Continuing work on functional mobility and activity tolerance;  Pt asleep upon PT arrival, but willing to get up and work; Performed multiple sit to stand transfers, as well as 2 step pivot transfers with RW and min assist (second person for safety); incontinent during session, and PT and tech assisted with hygiene;    Physical deconditioning in the setting of what is becoming a long acute hospital stay is a real hinderance to his progress, even as he is consistently particpating in therpay sessions; he clearly states that he doesn't like the recliner, and consistently refuses to spend any time longer than 10 minutes in the recliner; Discussed with RN, and we will obtain a geomat pressure redistribution cushion for the recliner seat; He is at a point where spending more time OOB during the day is critical for keeping effects of bedrest at bay;   Complicated dispo as well; continue to recommend post-acute rehab at SNF level -- consistent rehab follow up will most optimally build up strength and endurance, optimize functional mobility, maximize independence and safety with mobility and ADLs, and prepare him for getting a prosthesis; My understanding is that his insurance situation is potentially making going to SNF more difficult; If he had a place to go that is wheelchair accessible, that would be an option -- but  those options don't seem avaialble to him; Will consider Mr. Kille for the [facility] program     If plan is discharge home, recommend the following: A lot of help with bathing/dressing/bathroom;Supervision due to cognitive status;Help with stairs or ramp for entrance;A lot of help with walking and/or transfers;Assistance with cooking/housework;Assist for transportation   Can travel by private vehicle     No  Equipment Recommendations  Rolling walker (2 wheels);BSC/3in1;Wheelchair (measurements PT);Wheelchair cushion (measurements PT)    Recommendations for Other Services       Precautions / Restrictions Precautions Precautions: Fall;Other (comment) Recall of Precautions/Restrictions: Impaired Precaution/Restrictions Comments: R limb guard protector, wound vac Required Braces or Orthoses: Other Brace Other Brace: rt post op shoe Restrictions RLE Weight Bearing Per Provider Order: Non weight bearing Other Position/Activity Restrictions: has a post op shoe for R foot     Mobility  Bed Mobility Overal bed mobility: Needs Assistance Bed Mobility: Supine to Sit, Sit to Supine   Sidelying to sit: Min assist Supine to sit: Contact guard     General bed mobility comments: Slow-moving, cues to sit fully upright and to square off hips at EOB    Transfers Overall transfer level: Needs assistance Equipment used: Rolling walker (2 wheels) Transfers: Sit to/from Stand Sit to Stand: Min assist, From elevated surface   Step pivot transfers: Min assist, +2 safety/equipment       General transfer comment: cues for technique, hand placement for safety. lifting assistance required, as well as assist for steadying the RW; close guard with hop-steps to [facility]    Ambulation/Gait  Stairs             Wheelchair Mobility     Tilt Bed    Modified Rankin (Stroke Patients Only)       Balance     Sitting balance-Leahy Scale: Good       Standing  balance-Leahy Scale: Poor Standing balance comment: heavy relaince on rolling walker. increased assistance for dynamic activity                            Communication Communication Communication: No apparent difficulties  Cognition Arousal: Alert Behavior During Therapy: WFL for tasks assessed/performed   PT - Cognitive impairments: Initiation, Sequencing                       PT - Cognition Comments: decreased awareness of deficits and need for assistance. Following commands: Intact Following commands impaired: Follows one step commands with increased time    Cueing Cueing Techniques: Verbal cues, Tactile cues  Exercises Other Exercises Other Exercises: Bolsterd bridging RLE x10 reps    General Comments General comments (skin integrity, edema, etc.): Stooling during session; stood multiple times for hygeine; incontinent of bowel      Pertinent Vitals/Pain Pain Assessment Pain Assessment: Faces Faces Pain Scale: Hurts a little bit Pain Location: Did not specify; grimace may be related to effort Pain Descriptors / Indicators: Discomfort, Grimacing, Guarding, Burning Pain Intervention(s): Monitored during session    Home Living                          Prior Function            PT Goals (current goals can now be found in the care plan section) Acute Rehab PT Goals Patient Stated Goal: to be more independent PT Goal Formulation: With patient Time For Goal Achievement: 12/02/23 Potential to Achieve Goals: Good Progress towards PT goals: Progressing toward goals    Frequency    Min 2X/week      PT Plan      Co-evaluation              AM-PAC PT 6 Clicks Mobility   Outcome Measure  Help needed turning from your back to your side while in a flat bed without using bedrails?: A Little Help needed moving from lying on your back to sitting on the side of a flat bed without using bedrails?: A Little Help needed moving to and  from a bed to a chair (including a wheelchair)?: A Little Help needed standing up from a chair using your arms (e.g., wheelchair or bedside chair)?: A Little Help needed to walk in hospital room?: A Lot Help needed climbing 3-5 steps with a railing? : Total 6 Click Score: 15    End of Session Equipment Utilized During Treatment: Gait belt Activity Tolerance: Patient tolerated treatment well Patient left: in bed;with call bell/phone within reach;with bed alarm set Nurse Communication: Mobility status PT Visit Diagnosis: Unsteadiness on feet (R26.81);Difficulty in walking, not elsewhere classified (R26.2);Other abnormalities of gait and mobility (R26.89) Pain - Right/Left: Right Pain - part of body: Ankle and joints of foot     Time: 8675-8640 PT Time Calculation (min) (ACUTE ONLY): 35 min  Charges:    $Therapeutic Activity: 23-37 mins PT General Charges $$ ACUTE PT VISIT: 1 Visit  [name], PT  Acute Rehabilitation Services Office [phone] Secure Chat welcomed    [name] 11/28/2023, 2:40 PM

## 2023-11-28 NOTE — Progress Notes (Signed)
 K 5.3 this AM on scheduled Lokelma . D/w Team, we will give 1 extra dose of Lokelma  today per Dr Tobie.   Sergio Batch, PharmD, BCIDP, AAHIVP, CPP Infectious Disease Pharmacist 11/28/2023 12:35 PM

## 2023-11-28 NOTE — Plan of Care (Signed)
  Problem: Clinical Measurements: Goal: Will remain free from infection Outcome: Progressing Goal: Diagnostic test results will improve Outcome: Completed/Met Goal: Respiratory complications will improve Outcome: Completed/Met Goal: Cardiovascular complication will be avoided Outcome: Completed/Met

## 2023-11-29 DIAGNOSIS — M86171 Other acute osteomyelitis, right ankle and foot: Secondary | ICD-10-CM | POA: Diagnosis not present

## 2023-11-29 DIAGNOSIS — E1169 Type 2 diabetes mellitus with other specified complication: Secondary | ICD-10-CM | POA: Diagnosis not present

## 2023-11-29 DIAGNOSIS — R000 Tachycardia, unspecified: Secondary | ICD-10-CM | POA: Diagnosis not present

## 2023-11-29 DIAGNOSIS — E1151 Type 2 diabetes mellitus with diabetic peripheral angiopathy without gangrene: Secondary | ICD-10-CM | POA: Diagnosis not present

## 2023-11-29 LAB — BASIC METABOLIC PANEL WITH GFR
Anion gap: 8 (ref 5–15)
BUN: 57 mg/dL — ABNORMAL HIGH (ref 8–23)
CO2: 24 mmol/L (ref 22–32)
Calcium: 8.6 mg/dL — ABNORMAL LOW (ref 8.9–10.3)
Chloride: 96 mmol/L — ABNORMAL LOW (ref 98–111)
Creatinine, Ser: 2.27 mg/dL — ABNORMAL HIGH (ref 0.61–1.24)
GFR, Estimated: 31 mL/min — ABNORMAL LOW (ref >60)
Glucose, Bld: 97 mg/dL (ref 70–99)
Potassium: 5.2 mmol/L — ABNORMAL HIGH (ref 3.5–5.1)
Sodium: 128 mmol/L — ABNORMAL LOW (ref 135–145)

## 2023-11-29 LAB — CBC
HCT: 22.3 % — ABNORMAL LOW (ref 39.0–52.0)
Hemoglobin: 7.3 g/dL — ABNORMAL LOW (ref 13.0–17.0)
MCH: 28 pg (ref 26.0–34.0)
MCHC: 32.7 g/dL (ref 30.0–36.0)
MCV: 85.4 fL (ref 80.0–100.0)
Platelets: 306 K/uL (ref 150–400)
RBC: 2.61 MIL/uL — ABNORMAL LOW (ref 4.22–5.81)
RDW: 17.5 % — ABNORMAL HIGH (ref 11.5–15.5)
WBC: 6.7 K/uL (ref 4.0–10.5)
nRBC: 0 % (ref 0.0–0.2)

## 2023-11-29 LAB — GLUCOSE, CAPILLARY
Glucose-Capillary: 83 mg/dL (ref 70–99)
Glucose-Capillary: 128 mg/dL — ABNORMAL HIGH (ref 70–99)
Glucose-Capillary: 131 mg/dL — ABNORMAL HIGH (ref 70–99)
Glucose-Capillary: 158 mg/dL — ABNORMAL HIGH (ref 70–99)

## 2023-11-29 MED ORDER — SODIUM ZIRCONIUM CYCLOSILICATE 10 G PO PACK
10.0000 g | PACK | Freq: Once | ORAL | Status: AC
  Administered 2023-11-29: 10 g via ORAL
  Filled 2023-11-29: qty 1

## 2023-11-29 MED ORDER — LACTATED RINGERS IV BOLUS
250.0000 mL | Freq: Once | INTRAVENOUS | Status: AC
  Administered 2023-11-29: 250 mL via INTRAVENOUS

## 2023-11-29 NOTE — Progress Notes (Signed)
 HD#26 SUBJECTIVE:  Patient Summary: Derek Thompson is a 63 y.o. with a pertinent PMH of t PMH of End-stage HFrEF, CKD, CVA, bladder outlet obstruction s/p coude foley catheter during last admission 10/2023, and chronic osteomyelitis, admitted hypovolemic hyponatremia and AKI with coude foley and on CTX, resolved. Waiting for SNF.    Overnight Events: N/A  Interim History:    Patient reports he slept okay and is drinking well. He has no concerns, denying palpitations, shortness of breath, or chest pain. He mentioned that work forms have been sent but he has not yet contacted his employer about insurance. Physical therapy session went well. On exam, heart rate was tachycardic, possibly due to being awakened, will reassess. Lung exam normal. Patient denies cough.    OBJECTIVE:  Vital Signs: Vitals:   11/28/23 2112 11/29/23 0420 11/29/23 0745 11/29/23 1346  BP: 112/73 100/64 105/66 (!) 91/56  Pulse: (!) 124 98 91 80  Resp: 19 17 18    Temp: 99.9 F (37.7 C) 99.4 F (37.4 C) 98.5 F (36.9 C)   TempSrc: Oral Oral Oral   SpO2: 100% 94% 100% 100%  Weight:  60.2 kg    Height:       Supplemental O2: Room Air SpO2: 100 % O2 Flow Rate (L/min): 5 L/min  Filed Weights   11/26/23 0514 11/27/23 0519 11/29/23 0420  Weight: 61 kg 59.4 kg 60.2 kg     Intake/Output Summary (Last 24 hours) at 11/29/2023 1839 Last data filed at 11/29/2023 1816 Gross per 24 hour  Intake 360 ml  Output 1850 ml  Net -1490 ml   Net IO Since Admission: -75,067.19 mL [11/29/23 1839]  Physical Exam: Physical Exam  Const: Awake, alert in NAD HENT: Normocephalic, atraumatic, Card: tachycardic, No pitting edema on LE's bilaterally  Resp: LCTAB, No increased work of breathing Abd: NTND, No suprapubic tenderness noted.  Extremities: RLE BKA no discharge, no swelling, without tenderness   Patient Lines/Drains/Airways Status     Active Line/Drains/Airways     Name Placement date Placement time Site  Days   Peripheral IV 11/29/23 20 G 1 Left;Posterior Forearm 11/29/23  1231  Forearm  less than 1   Urethral Catheter Mekides Nida, RN Coude 16 Fr. 11/21/23  1522  Coude  8   Wound 11/16/23 0826 Surgical  Sacrum Left 11/16/23  0826  Sacrum  13   Wound 11/17/23 1030 Surgical Closed Surgical Incision Knee Right 11/17/23  1030  Knee  12            Pertinent labs and imaging:      Latest Ref Rng & Units 11/29/2023    4:54 AM 11/28/2023    5:09 AM 11/27/2023    6:05 AM  CBC  WBC 4.0 - 10.5 K/uL 6.7  6.2  6.6   Hemoglobin 13.0 - 17.0 g/dL 7.3  7.3  7.2   Hematocrit 39.0 - 52.0 % 22.3  23.3  22.3   Platelets 150 - 400 K/uL 306  306  306        Latest Ref Rng & Units 11/29/2023    4:54 AM 11/28/2023    5:09 AM 11/27/2023    6:05 AM  CMP  Glucose 70 - 99 mg/dL 97  884  91   BUN 8 - 23 mg/dL 57  57  56   Creatinine 0.61 - 1.24 mg/dL 7.72  7.72  7.63   Sodium 135 - 145 mmol/L 128  129  129   Potassium 3.5 -  5.1 mmol/L 5.2  5.3  4.9   Chloride 98 - 111 mmol/L 96  97  96   CO2 22 - 32 mmol/L 24  24  24    Calcium  8.9 - 10.3 mg/dL 8.6  8.5  8.6     No results found.  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 Patient had a heart rate of 85 bpm and denied palpitations or discomfort. No dizziness, chest pain, nausea, vomiting, or sweating  reported.   CAUTI Resolved, Patient reports no dysuria or fever. 7 day Ceftriaxone  completed. Plan: Maintain Coude catheter; may remain for up to 30 days per urology guidance Monitor for signs of infection or clinical changes Follow-up urine studies if clinically indicated     S/p prostatic EBRT Patient reports frequent morning episodes of incontinence. Expressed concern about night shift staff, feeling they are less skilled and worried about falling again.  -Reassured him and told him a note would be added.    Chronic osteomyelitis R foot PAD S/P BKA 11/17/23 -POD 10 R BKA with Dr. Harden. Denies pain at surgical site. Wound was clen without redness, swelling or discharge. -Dry dressing dry   Elevated Protein gap  SPEP with Positive M-Spike SPEP shows gamma globulin of 2.1 and an M-spike of 1.1. The kappa/lambda light chain ratio is 0.58. IgG is elevated at 2,664 with lambda specificity. UPEP reveals positive Bence Jones protein. Beta-2 microglobulin is elevated at 7.2. Bone marrow biopsy from 7/17 indicates CD56 positivity in a subset of aberrant plasma cells, consistent with a neoplastic process. These findings are suggestive of multiple myeloma. Hematology (Dr. Autumn) has been consulted and will manage the patient on an outpatient basis. This diagnosis likely explains the patient's ongoing issues, including renal dysfunction and electrolyte abnormalities.  Plan: -Continue outpatient follow-up with hematology (Dr. Autumn) -Monitor renal function and electrolytes closely -Supportive care as needed for renal impairment - Lokelma  Bid for hyperkalemia    Hyponatremia Sodium is 130 Likely multifactorial, including cardiorenal syndrome, AKI, bladder outlet obstruction, and hypovolemia.  -High suspicion for pseudohyponatremia due to presence of monoclonal gammopathy.    Debility Functional Deconditioning Patient is currently hospitalized day 29 with significantly reduced mobility,  spending most of the time in bed.  -Postoperative following right below-knee amputation (BKA). -Continue physical and occupational therapy.     AKI Bladder outlet obstruction with BL hydroureter NAGMA Since admission, AKI has significantly improved. Developed post-ATN diuresis, Will keep coude catheter in place. Per urology, needs to be replaced every 30 days.  -Renal function likely impacted by monoclonal gammopathy of renal significance. Hematology following -Sodium Bicarb 1300 to TID for persistent NAGMA.      Biventricular HFrEF EF 20-25%.  Persistent Tacchycardia Hx of nonobstructive CAD. Nonadherent to medications. Not a candidate for advanced therapies. Suspicion of prior  MI but none documented in the chart. Remains peripherally euvolemic.  - Monitor electrolytes; goal Mg 2, K ~4. - Monitor urine output. - Continue metoprolol  25mg  BID   T2DM -Continue SSI with sensitive (0-9 units) and 15 semglee       Hx of LV thrombus (not visualized on TTE and MR 10/2023) New subscapular splenic infarct -Contine Eliquis  5mg  BID   Acute on chronic anemia -Low sat ratios during last hospitalization.  -S/p IV Iron  7/8. Will continue daily CBC's. Transfuse if Hbg <7   Multisystem organ failure Goals of care Palliative Consult Severe protein calorie malnutrition -Discussed multisystem organ failure and recurrent hospitalizations -Palliative care saw pt and pt expressed desire to continue to be full code, full scope.  -At this time, he and his family wish to continue aggressive care. -Per PT eval, Pt to d/c to SNF, waiting for insurance      Best Practice: Diet: Renal diet IVF: None VTE: Eliquis  5 mg BID Code: Full   Disposition planning: Family Contact: children, called and notified. DISPO: Anticipated discharge in 4 days to Skilled nursing facility pending for insurance. Signature:  May Ozment [facility]  6:39 PM, 11/29/2023  On Call pager  [phone]

## 2023-11-29 NOTE — Progress Notes (Signed)
 Physical Therapy Treatment Patient Details Name: Derek Thompson MRN: [id number] DOB: 07/04/1960 Today's Date: 11/29/2023   History of Present Illness Pt is a 63 y/o M presenting to ED on 11/02/23 with lethargy. Pt found to be tachycardic, hyperkalemic, and hyponatremic. 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    PT Comments  Continuing work on functional mobility and activity tolerance;  Initial plan for session was for further transfer training -- lateral scooting, basic pivoting; However, pt with soft BPs and demonstrated a drop in BP while sitting EOB; See vitals flow sheets for details; Opted to lay back down (IV Team to re-start IV);     11/29/23 1142  Vital Signs  Patient Position (if appropriate) Orthostatic Vitals  Orthostatic Lying   BP- Lying 90/59 (MAP 69)  Pulse- Lying 86  Orthostatic Sitting  BP- Sitting (!) 85/68 (MAP 68)  Pulse- Sitting 91   Pt did not report dizziness;   Obtained geomat pressure redistribution cushion for his recliner and a drop-arm BSC     If plan is discharge home, recommend the following: A lot of help with bathing/dressing/bathroom;Supervision due to cognitive status;Help with stairs or ramp for entrance;A lot of help with walking and/or transfers;Assistance with cooking/housework;Assist for transportation   Can travel by private vehicle     No  Equipment Recommendations  Rolling walker (2 wheels);BSC/3in1;Wheelchair (measurements PT);Wheelchair cushion (measurements PT)    Recommendations for Other Services       Precautions / Restrictions Precautions Precautions: Fall;Other (comment) Recall of Precautions/Restrictions: Impaired Precaution/Restrictions Comments: R limb  guard protector, wound vac Restrictions RLE Weight Bearing Per Provider Order: Non weight bearing     Mobility  Bed Mobility Overal bed mobility: Needs Assistance Bed Mobility: Supine to Sit, Sit to Supine Rolling: Supervision, Used rails Sidelying to sit: Min assist Supine to sit: Supervision     General bed mobility comments: Slow-moving, min handheld assist to pull to sit; cues to sit fully upright and to square off hips at EOB    Transfers                   General transfer comment: BP drop sitting EOB, so held transfers this session    Ambulation/Gait                   Stairs             Wheelchair Mobility     Tilt Bed    Modified Rankin (Stroke Patients Only)       Balance     Sitting balance-Leahy Scale: Good                                      Communication Communication Communication: No apparent difficulties  Cognition Arousal: Alert Behavior During Therapy: WFL for tasks assessed/performed, Flat affect   PT - Cognitive impairments: Initiation, Sequencing                       PT - Cognition Comments: decreased awareness of deficits and need for assistance; incr time and tactile cues for initiation Following commands: Intact Following commands impaired: Follows one step commands with  increased time    Cueing Cueing Techniques: Verbal cues, Tactile cues  Exercises      General Comments General comments (skin integrity, edema, etc.): Obtained a drop-arm BSC and placed in pt's room      Pertinent Vitals/Pain Pain Assessment Pain Assessment: Faces Faces Pain Scale: No hurt Pain Intervention(s): Monitored during session    Home Living                          Prior Function            PT Goals (current goals can now be found in the care plan section) Acute Rehab PT Goals Patient Stated Goal: No goal stated today; let's just do what we have to do PT Goal Formulation: With  patient Time For Goal Achievement: 12/02/23 Potential to Achieve Goals: Good Progress towards PT goals: Progressing toward goals (Slowly, limited by BP drop in sitting)    Frequency    Min 2X/week      PT Plan      Co-evaluation              AM-PAC PT 6 Clicks Mobility   Outcome Measure  Help needed turning from your back to your side while in a flat bed without using bedrails?: A Little Help needed moving from lying on your back to sitting on the side of a flat bed without using bedrails?: A Little Help needed moving to and from a bed to a chair (including a wheelchair)?: A Little Help needed standing up from a chair using your arms (e.g., wheelchair or bedside chair)?: A Little Help needed to walk in hospital room?: Total Help needed climbing 3-5 steps with a railing? : Total 6 Click Score: 14    End of Session   Activity Tolerance: Other (comment) (BP drop in sitting) Patient left: in bed;with call bell/phone within reach;with bed alarm set Nurse Communication: Mobility status PT Visit Diagnosis: Unsteadiness on feet (R26.81);Difficulty in walking, not elsewhere classified (R26.2);Other abnormalities of gait and mobility (R26.89) Pain - Right/Left: Right Pain - part of body:  (Phantom pain/sensation)     Time: 8879-8861 PT Time Calculation (min) (ACUTE ONLY): 18 min  Charges:    $Therapeutic Activity: 8-22 mins PT General Charges $$ ACUTE PT VISIT: 1 Visit                     [name], PT  Acute Rehabilitation Services Office [phone] Secure Chat welcomed    [name] 11/29/2023, 11:59 AM

## 2023-11-29 NOTE — Progress Notes (Signed)
 HD#26 SUBJECTIVE:  Patient Summary: Derek Thompson is a 63 y.o. with a pertinent PMH of t PMH of End-stage HFrEF, CKD, CVA, bladder outlet obstruction s/p coude foley catheter during last admission 10/2023, and chronic osteomyelitis, admitted hypovolemic hyponatremia and AKI with coude foley and on CTX, resolved. Waiting for SNF.  Overnight Events: N/A  Interim History:   No concerns at this time. The patient is sleeping and eating well. During the cardiac exam, palpitations were detected, but the patient denies chest pain, dizziness, or any discomfort.  OBJECTIVE:  Vital Signs: Vitals:   11/28/23 2112 11/29/23 0420 11/29/23 0745 11/29/23 1346  BP: 112/73 100/64 105/66 (!) 91/56  Pulse: (!) 124 98 91 80  Resp: 19 17 18    Temp: 99.9 F (37.7 C) 99.4 F (37.4 C) 98.5 F (36.9 C)   TempSrc: Oral Oral Oral   SpO2: 100% 94% 100% 100%  Weight:  60.2 kg    Height:       Supplemental O2:  SpO2: 100 % O2 Flow Rate (L/min): 5 L/min  Filed Weights   11/26/23 0514 11/27/23 0519 11/29/23 0420  Weight: 61 kg 59.4 kg 60.2 kg     Intake/Output Summary (Last 24 hours) at 11/29/2023 1434 Last data filed at 11/29/2023 1307 Gross per 24 hour  Intake 360 ml  Output 1400 ml  Net -1040 ml   Net IO Since Admission: -23,932.19 mL [11/29/23 1434]  Physical Exam: Physical Exam Const: Awake, alert in NAD HENT: Normocephalic, atraumatic, Card: tachycardic, No pitting edema on LE's bilaterally  Resp: LCTAB, No increased work of breathing Abd: NTND, No suprapubic tenderness noted.  Extremities: RLE BKA with would vac and rigid BKA protector in place Patient Lines/Drains/Airways Status     Active Line/Drains/Airways     Name Placement date Placement time Site Days   Peripheral IV 11/29/23 20 G 1 Left;Posterior Forearm 11/29/23  1231  Forearm  less than 1   Urethral Catheter Mekides Nida, RN Coude 16 Fr. 11/21/23  1522  Coude  8   Wound 11/16/23 0826 Surgical  Sacrum Left 11/16/23   0826  Sacrum  13   Wound 11/17/23 1030 Surgical Closed Surgical Incision Knee Right 11/17/23  1030  Knee  12            Pertinent labs and imaging:      Latest Ref Rng & Units 11/29/2023    4:54 AM 11/28/2023    5:09 AM 11/27/2023    6:05 AM  CBC  WBC 4.0 - 10.5 K/uL 6.7  6.2  6.6   Hemoglobin 13.0 - 17.0 g/dL 7.3  7.3  7.2   Hematocrit 39.0 - 52.0 % 22.3  23.3  22.3   Platelets 150 - 400 K/uL 306  306  306        Latest Ref Rng & Units 11/29/2023    4:54 AM 11/28/2023    5:09 AM 11/27/2023    6:05 AM  CMP  Glucose 70 - 99 mg/dL 97  884  91   BUN 8 - 23 mg/dL 57  57  56   Creatinine 0.61 - 1.24 mg/dL 7.72  7.72  7.63   Sodium 135 - 145 mmol/L 128  129  129   Potassium 3.5 - 5.1 mmol/L 5.2  5.3  4.9   Chloride 98 - 111 mmol/L 96  97  96   CO2 22 - 32 mmol/L 24  24  24    Calcium  8.9 - 10.3 mg/dL 8.6  8.5  8.6     No results found.  ASSESSMENT/PLAN:  Assessment: Principal Problem:   Osteomyelitis of right foot (HCC) Active Problems:   CAD (coronary artery disease)   Hyperkalemia   AKI (acute kidney injury) (HCC)   Anemia   LV (left ventricular) mural thrombus without MI (HCC)   Gangrene of right foot (HCC)   Hyponatremia   HFrEF (heart failure with reduced ejection fraction) (HCC)   Chronic osteomyelitis involving lower leg, right (HCC)   Frailty   Urinary retention   Generalized weakness   Sinus tachycardia   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 (HCC)   Chronic anemia   Hypoalbuminemia   Monoclonal gammopathy   Hx of right BKA (HCC)   Plan:   CAUTI Resolved, Patient reports no dysuria or fever. 7 day Ceftriaxone  completed. Plan: Maintain Coude catheter; may remain for up to 30 days per urology guidance Monitor for signs of infection or clinical changes Follow-up urine studies if clinically indicated   Palpitation:  The patient exhibited an elevated heart rate of 130 bpm during the cardiac examination but denied any discomfort  or palpitations. The tachycardia may be related to inadequate hydration. The patient reported episodes of watery bowel incontinence but denied dizziness, chest pain, nausea, vomiting, or sweating.  -ECG showed sinus tachycardia with no new changes compared to prior tracings. -Administered 250 mL of lactated Ringer 's solution slowly. -Heart rate returned promptly to 90 bpm upon leaving the room, as noted on telemetry monitoring.    S/p prostatic EBRT Patient reports frequent morning episodes of incontinence. Expressed concern about night shift staff, feeling they are less skilled and worried about falling again.  -Reassured him and told him a note would be added.    Chronic osteomyelitis R foot PAD S/P BKA 11/17/23 -POD 10 R BKA with Dr. Harden. Denies pain at surgical site. Wound was clen without redness, swelling or discharge. -Wound vac removed. -Dry dressing dry   Elevated Protein gap  SPEP with Positive M-Spike SPEP shows gamma globulin of 2.1 and an M-spike of 1.1. The kappa/lambda light chain ratio is 0.58. IgG is elevated at 2,664 with lambda specificity. UPEP reveals positive Bence Jones protein. Beta-2 microglobulin is elevated at 7.2. Bone marrow biopsy from 7/17 indicates CD56 positivity in a subset of aberrant plasma cells, consistent with a neoplastic process. These findings are suggestive of multiple myeloma. Hematology (Dr. Autumn) has been consulted and will manage the patient on an outpatient basis. This diagnosis likely explains the patient's ongoing issues, including renal dysfunction and electrolyte abnormalities.  Plan: -Continue outpatient follow-up with hematology (Dr. Autumn) -Monitor renal function and electrolytes closely -Supportive care as needed for renal impairment   Hyponatremia Sodium is 128 Likely multifactorial, including cardiorenal syndrome, AKI, bladder outlet obstruction, and hypovolemia.  -High suspicion for pseudohyponatremia due to presence of  monoclonal gammopathy.    Debility Functional Deconditioning Patient is currently hospitalized day 25 with significantly reduced mobility, spending most of the time in bed.  -Postoperative day 12 following right below-knee amputation (BKA). -Continue physical and occupational therapy.     AKI Bladder outlet obstruction with BL hydroureter NAGMA Since admission, AKI has significantly improved. Developed post-ATN diuresis, Will keep coude catheter in place. Per urology, needs to be replaced every 30 days.  -Renal function likely impacted by monoclonal gammopathy of renal significance. Hematology following -Sodium Bicarb 1300 to TID for persistent NAGMA.      Biventricular HFrEF EF 20-25%.  Persistent Tacchycardia Hx  of nonobstructive CAD. Nonadherent to medications. Not a candidate for advanced therapies. Suspicion of prior MI but none documented in the chart. Remains peripherally euvolemic.  - Monitor electrolytes; goal Mg 2, K ~4. - Monitor urine output. - Continue metoprolol  25mg  BID   T2DM -Continue SSI with sensitive (0-9 units) and 15 semglee       Hx of LV thrombus (not visualized on TTE and MR 10/2023) New subscapular splenic infarct -Contine Eliquis  5mg  BID   Acute on chronic anemia -Low sat ratios during last hospitalization.  -S/p IV Iron  7/8. Will continue daily CBC's. Transfuse if Hbg <7   Multisystem organ failure Goals of care Palliative Consult Severe protein calorie malnutrition -Discussed multisystem organ failure and recurrent hospitalizations -Palliative care saw pt and pt expressed desire to continue to be full code, full scope.  -At this time, he and his family wish to continue aggressive care. -Per PT eval, Pt to d/c to SNF, waiting for insurance      Best Practice: Diet: Renal diet IVF: None VTE: Eliquis  5 mg BID Code: Full   Disposition planning: Family Contact: children, called and notified. DISPO: Anticipated discharge in 4 days to  Skilled nursing facility pending for insurance.   Signature:  Gwynn Crossley [facility]  2:34 PM, 11/29/2023  On Call pager [phone]

## 2023-11-29 NOTE — Progress Notes (Signed)
 Occupational Therapy Treatment Patient Details Name: Derek Thompson MRN: [id number] DOB: 1961-03-04 Today's Date: 11/29/2023   History of present illness Pt is a 63 y/o M presenting to ED on 11/02/23 with lethargy. Pt found to be tachycardic, hyperkalemic, and hyponatremic. 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   OT comments  Pt receiving IV fluids following orthostatic hypotension with PT earlier today. Pt on bed pan upon arrival and had forgotten. Pt rolled mod I for removal of bed pan and pericare. Transitioned to EOB with supervision and stood x 1 with min assist with plan to transfer to chair. Pt with bowel incontinence. Returned to supine and back onto bed pan. No report of s/s of orthostatic hypotension. Pt's chair set up with geomat for later transfer with nursing staff. Patient will benefit from continued inpatient follow up therapy, <3 hours/day       If plan is discharge home, recommend the following:  A lot of help with bathing/dressing/bathroom;Assistance with cooking/housework;Direct supervision/assist for medications management;Direct supervision/assist for financial management;Assist for transportation;Help with stairs or ramp for entrance;Two people to help with walking and/or transfers   Equipment Recommendations  Other (comment) (defer)    Recommendations for Other Services      Precautions / Restrictions Precautions Precautions: Fall;Other (comment) Recall of Precautions/Restrictions: Impaired Precaution/Restrictions Comments: R limb guard protector Restrictions Weight Bearing Restrictions Per Provider Order: Yes RLE Weight Bearing Per Provider Order: Non weight bearing       Mobility Bed  Mobility Overal bed mobility: Needs Assistance Bed Mobility: Supine to Sit, Sit to Supine Rolling: Modified independent (Device/Increase time), Used rails   Supine to sit: Supervision, HOB elevated Sit to supine: Supervision   General bed mobility comments: no assist using features of bed for supine to sit, rolls mod I with rail for bed pan and pericare    Transfers Overall transfer level: Needs assistance Equipment used: Rolling walker (2 wheels) Transfers: Sit to/from Stand Sit to Stand: Min assist, From elevated surface           General transfer comment: pt receiving fluids due to earlier hypotension at EOB, no s/s this visit, but upon standing, pt stating he needed to return to the bed pan to continue having BM     Balance Overall balance assessment: Needs assistance   Sitting balance-Leahy Scale: Good       Standing balance-Leahy Scale: Poor                             ADL either performed or assessed with clinical judgement   ADL Overall ADL's : Needs assistance/impaired                     Lower Body Dressing: Total assistance;Bed level       Toileting- Clothing Manipulation and Hygiene: Total assistance;Bed level              Extremity/Trunk Assessment              Vision       Perception     Praxis     Communication Communication Communication: No apparent difficulties   Cognition Arousal: Alert Behavior During Therapy: WFL for tasks assessed/performed, Flat affect  Cognition: Cognition impaired       Memory impairment (select all impairments): Short-term memory   Executive functioning impairment (select all impairments): Problem solving OT - Cognition Comments: pt on bedpan upon arrival and had forgotten                 Following commands: Intact Following commands impaired: Follows one step commands with increased time      Cueing   Cueing Techniques: Verbal cues, Tactile cues  Exercises       Shoulder Instructions       General Comments Obtained a drop-arm BSC and placed in pt's room    Pertinent Vitals/ Pain       Pain Assessment Pain Assessment: No/denies pain  Home Living                                          Prior Functioning/Environment              Frequency  Min 2X/week        Progress Toward Goals  OT Goals(current goals can now be found in the care plan section)  Progress towards OT goals: Progressing toward goals  Acute Rehab OT Goals OT Goal Formulation: With patient Time For Goal Achievement: 12/04/23 Potential to Achieve Goals: Good  Plan      Co-evaluation                 AM-PAC OT 6 Clicks Daily Activity     Outcome Measure   Help from another person eating meals?: None Help from another person taking care of personal grooming?: A Little Help from another person toileting, which includes using toliet, bedpan, or urinal?: Total Help from another person bathing (including washing, rinsing, drying)?: A Lot Help from another person to put on and taking off regular upper body clothing?: A Little Help from another person to put on and taking off regular lower body clothing?: A Lot 6 Click Score: 15    End of Session Equipment Utilized During Treatment: Gait belt;Rolling walker (2 wheels)  OT Visit Diagnosis: Unsteadiness on feet (R26.81);Other abnormalities of gait and mobility (R26.89);Muscle weakness (generalized) (M62.81);Other symptoms and signs involving cognitive function   Activity Tolerance Other (comment) (limited by bowel incontinence)   Patient Left in bed;with call bell/phone within reach;with bed alarm set   Nurse Communication          Time: 8642-8587 OT Time Calculation (min): 15 min  Charges: OT General Charges $OT Visit: 1 Visit OT Treatments $Self Care/Home Management : 8-22 mins [name], OTR/L Acute Rehabilitation Services Office: [phone]   [name] 11/29/2023, 2:42 PM

## 2023-11-30 DIAGNOSIS — M86171 Other acute osteomyelitis, right ankle and foot: Secondary | ICD-10-CM | POA: Diagnosis not present

## 2023-11-30 DIAGNOSIS — E1151 Type 2 diabetes mellitus with diabetic peripheral angiopathy without gangrene: Secondary | ICD-10-CM | POA: Diagnosis not present

## 2023-11-30 DIAGNOSIS — E1169 Type 2 diabetes mellitus with other specified complication: Secondary | ICD-10-CM | POA: Diagnosis not present

## 2023-11-30 DIAGNOSIS — R000 Tachycardia, unspecified: Secondary | ICD-10-CM | POA: Diagnosis not present

## 2023-11-30 LAB — CBC
HCT: 23.3 % — ABNORMAL LOW (ref 39.0–52.0)
Hemoglobin: 7.4 g/dL — ABNORMAL LOW (ref 13.0–17.0)
MCH: 27.4 pg (ref 26.0–34.0)
MCHC: 31.8 g/dL (ref 30.0–36.0)
MCV: 86.3 fL (ref 80.0–100.0)
Platelets: 299 K/uL (ref 150–400)
RBC: 2.7 MIL/uL — ABNORMAL LOW (ref 4.22–5.81)
RDW: 17.9 % — ABNORMAL HIGH (ref 11.5–15.5)
WBC: 6.5 K/uL (ref 4.0–10.5)
nRBC: 0 % (ref 0.0–0.2)

## 2023-11-30 LAB — BASIC METABOLIC PANEL WITH GFR
Anion gap: 12 (ref 5–15)
BUN: 53 mg/dL — ABNORMAL HIGH (ref 8–23)
CO2: 22 mmol/L (ref 22–32)
Calcium: 8.2 mg/dL — ABNORMAL LOW (ref 8.9–10.3)
Chloride: 96 mmol/L — ABNORMAL LOW (ref 98–111)
Creatinine, Ser: 2.43 mg/dL — ABNORMAL HIGH (ref 0.61–1.24)
GFR, Estimated: 29 mL/min — ABNORMAL LOW (ref >60)
Glucose, Bld: 154 mg/dL — ABNORMAL HIGH (ref 70–99)
Potassium: 4.9 mmol/L (ref 3.5–5.1)
Sodium: 130 mmol/L — ABNORMAL LOW (ref 135–145)

## 2023-11-30 LAB — GLUCOSE, CAPILLARY
Glucose-Capillary: 175 mg/dL — ABNORMAL HIGH (ref 70–99)
Glucose-Capillary: 182 mg/dL — ABNORMAL HIGH (ref 70–99)
Glucose-Capillary: 99 mg/dL (ref 70–99)
Glucose-Capillary: 152 mg/dL — ABNORMAL HIGH (ref 70–99)

## 2023-11-30 MED ORDER — LACTATED RINGERS IV BOLUS
250.0000 mL | Freq: Once | INTRAVENOUS | Status: AC
  Administered 2023-11-30: 250 mL via INTRAVENOUS

## 2023-11-30 MED ORDER — SODIUM ZIRCONIUM CYCLOSILICATE 10 G PO PACK
10.0000 g | PACK | Freq: Two times a day (BID) | ORAL | Status: DC
  Administered 2023-11-30 – 2023-12-04 (×9): 10 g via ORAL
  Filled 2023-11-30 (×9): qty 1

## 2023-11-30 MED ORDER — ATORVASTATIN CALCIUM 80 MG PO TABS
80.0000 mg | ORAL_TABLET | Freq: Every day | ORAL | Status: DC
  Administered 2023-11-30 – 2024-01-08 (×43): 80 mg via ORAL
  Filled 2023-11-30 (×5): qty 1
  Filled 2023-11-30: qty 2
  Filled 2023-11-30 (×12): qty 1
  Filled 2023-11-30: qty 2
  Filled 2023-11-30 (×11): qty 1
  Filled 2023-11-30: qty 2
  Filled 2023-11-30 (×10): qty 1

## 2023-11-30 NOTE — Progress Notes (Signed)
 Occupational Therapy Treatment Patient Details Name: Derek Thompson MRN: [id number] DOB: 08/26/1960 Today's Date: 11/30/2023   History of present illness Pt is a 63 y/o M presenting to ED on 11/02/23 with lethargy. Pt found to be tachycardic, hyperkalemic, and hyponatremic. 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   OT comments  Pt feeling much better than initial eval, able to fully participate, A/Ox4. Pt eager to perform OOB activity but limited due to dizziness and low BP. Pt unable to tolerate more than 20 seconds of standing with min A to power STS. After lying down for a few minutes BP 78/53, feeling fatigued, deferred OOB activities. Pt assisted with bed level ADLs, supervision for bed mobility, moving well. Pt sat in chair position in bed as he declined getting to recliner.  Will conitnue to follow acutely to progress as able, DC to postacute rehab still recommended.       If plan is discharge home, recommend the following:  A lot of help with bathing/dressing/bathroom;Assistance with cooking/housework;Direct supervision/assist for medications management;Direct supervision/assist for financial management;Assist for transportation;Help with stairs or ramp for entrance;Two people to help with walking and/or transfers   Equipment Recommendations  Other (comment) (defer)    Recommendations for Other Services      Precautions / Restrictions Precautions Precautions: Fall;Other (comment) Recall of Precautions/Restrictions: Impaired Precaution/Restrictions Comments: R limb guard protector Required Braces or Orthoses: Other Brace Restrictions Weight Bearing Restrictions Per Provider Order: Yes RLE Weight Bearing Per  Provider Order: Non weight bearing       Mobility Bed Mobility Overal bed mobility: Needs Assistance Bed Mobility: Supine to Sit, Sit to Supine Rolling: Modified independent (Device/Increase time)   Supine to sit: Supervision Sit to supine: Supervision   General bed mobility comments: supervision for safety    Transfers Overall transfer level: Needs assistance Equipment used: Rolling walker (2 wheels) Transfers: Sit to/from Stand, Bed to chair/wheelchair/BSC Sit to Stand: Min assist Stand pivot transfers: Contact guard assist         General transfer comment: min A to power STS, CGA for stand pivot with RW, positive orthostatics limit tolerance     Balance Overall balance assessment: Needs assistance Sitting-balance support: No upper extremity supported, Feet supported Sitting balance-Leahy Scale: Good     Standing balance support: Bilateral upper extremity supported, During functional activity, Reliant on assistive device for balance Standing balance-Leahy Scale: Poor Standing balance comment: heavy relaince on rolling walker. increased assistance for dynamic activity                           ADL either performed or assessed with clinical judgement   ADL Overall ADL's : Needs assistance/impaired Eating/Feeding: Independent   Grooming: Set up;Sitting;Bed level   Upper Body Bathing: Set up;Sitting       Upper Body Dressing : Contact guard assist       Toilet Transfer: Contact guard assist;Rolling walker (2 wheels);BSC/3in1   Toileting- Clothing Manipulation and Hygiene: Maximal assistance;Bed level         General ADL Comments: Pt is physically able to stand and transfer with CGA but due to postive orthostatics Pt has poor standind tolerance limiting functional participation in ADLs.    Extremity/Trunk Assessment Upper  Extremity Assessment Upper Extremity Assessment: Overall WFL for tasks assessed            Vision       Perception      Praxis     Communication Communication Communication: No apparent difficulties   Cognition Arousal: Alert Behavior During Therapy: WFL for tasks assessed/performed, Flat affect Cognition: Cognition impaired             OT - Cognition Comments: Pt A/O today, feeling better, not formally assessed but able to fully participate and therapy and make needs known.                 Following commands: Intact        Cueing   Cueing Techniques: Verbal cues  Exercises      Shoulder Instructions       General Comments      Pertinent Vitals/ Pain       Pain Assessment Pain Assessment: Faces Faces Pain Scale: Hurts a little bit Pain Location: generalized Pain Descriptors / Indicators: Discomfort, Grimacing, Guarding, Burning Pain Intervention(s): Monitored during session  Home Living Family/patient expects to be discharged to:: Skilled nursing facility                                 Additional Comments: Pt A/O today      Prior Functioning/Environment              Frequency  Min 2X/week        Progress Toward Goals  OT Goals(current goals can now be found in the care plan section)     Acute Rehab OT Goals Patient Stated Goal: to improve activity tolerance OT Goal Formulation: With patient Time For Goal Achievement: 12/04/23 Potential to Achieve Goals: Good  Plan      Co-evaluation                 AM-PAC OT 6 Clicks Daily Activity     Outcome Measure   Help from another person eating meals?: None Help from another person taking care of personal grooming?: A Little Help from another person toileting, which includes using toliet, bedpan, or urinal?: A Lot Help from another person bathing (including washing, rinsing, drying)?: A Lot Help from another person to put on and taking off regular upper body clothing?: A Little Help from another person to put on and taking off regular lower body clothing?: A Lot 6 Click  Score: 16    End of Session Equipment Utilized During Treatment: Gait belt;Rolling walker (2 wheels)  OT Visit Diagnosis: Unsteadiness on feet (R26.81);Other abnormalities of gait and mobility (R26.89);Muscle weakness (generalized) (M62.81);Other symptoms and signs involving cognitive function Pain - Right/Left: Right Pain - part of body: Leg   Activity Tolerance Patient tolerated treatment well   Patient Left in bed;with call bell/phone within reach   Nurse Communication Mobility status        Time: 8784-8744 OT Time Calculation (min): 40 min  Charges: OT General Charges $OT Visit: 1 Visit OT Treatments $Self Care/Home Management : 23-37 mins $Therapeutic Activity: 8-22 mins  Saxon Crosby, OTR/L   Jru Pense R Kenyen Candy 11/30/2023, 1:18 PM

## 2023-11-30 NOTE — TOC Progression Note (Addendum)
 Transition of Care [facility]) - Progression Note    Patient Details  Name: Derek Thompson MRN: [id number] Date of Birth: 1960-09-19  Transition of Care [facility]) CM/SW Contact  Olivine Hiers SHAUNNA [place], [name] Phone Number: 11/30/2023, 10:52 AM  Clinical Narrative:     Received email from financial navigator stating the following:  I submitted a disability referral for [name] when he was here a couple months ago and was advised from [facility] that he is not eligible for disability due to his age. He would need to apply for early retirement on his own because they would not be able to take care of that, and the representative reached out to the patient's mom to advise as well.  I also submitted a Medicaid application for him and his social worker at [employer] closed it out on 06/11 because he did not provide the paperwork they requested in order to further process his application.  I will reach out to DSS to see if he can still provide the paperwork and they reopen what he had or see if another application needs to be submitted to start over and go from there.  1315: CSW met with pt and provided update. Pt states he spoke with his union rep who informed him that his short term disability paperwork had been submitted to HR and that it's in their hands. He requests CSW call union rep, [name] Meier, [phone].   CSW called Bryant Meier who states that pt's short term issability was submitted to HR and they have forwarded it to [employer] for processing. She states CSW can call back later for update.   1430: Received call from [name] stating that one more form is needed to be completed with a continuation date of 08/21/23. She emailed CSW form.  1510: CSW attempted to complete form though form did not have a spot for continuation date. CSW called [name]; she will call [employer] to clarify how/which form needs to be completed  Expected Discharge Plan: Skilled Nursing Facility Barriers to  Discharge: Inadequate or no insurance               Expected Discharge Plan and Services In-house Referral: Clinical Social Work   Post Acute Care Choice: Skilled Nursing Facility Living arrangements for the past 2 months: Homeless Shelter                                       Social Drivers of Health (SDOH) Interventions SDOH Screenings   Food Insecurity: No Food Insecurity (11/02/2023)  Housing: High Risk (11/02/2023)  Transportation Needs: Unmet Transportation Needs (11/02/2023)  Utilities: Not At Risk (11/02/2023)  Alcohol Screen: Low Risk  (08/19/2022)  Depression (PHQ2-9): Low Risk  (10/11/2023)  Financial Resource Strain: Low Risk  (08/19/2022)  Tobacco Use: Medium Risk (11/17/2023)    Readmission Risk Interventions     No data to display

## 2023-11-30 NOTE — Plan of Care (Signed)
   Problem: Health Behavior/Discharge Planning: Goal: Ability to manage health-related needs will improve Outcome: Completed/Met

## 2023-11-30 NOTE — Progress Notes (Signed)
 Ok to resume home Lipitor  80mg  PO qday per Dr. Azadegan.   Sergio Batch, PharmD, BCIDP, AAHIVP, CPP Infectious Disease Pharmacist 11/30/2023 10:58 AM

## 2023-11-30 NOTE — Plan of Care (Signed)
  Problem: Coping: Goal: Level of anxiety will decrease Outcome: Progressing   Problem: Elimination: Goal: Will not experience complications related to bowel motility Outcome: Progressing Goal: Will not experience complications related to urinary retention Outcome: Progressing   Problem: Pain Managment: Goal: General experience of comfort will improve and/or be controlled Outcome: Progressing

## 2023-12-01 LAB — CULTURE, BLOOD (ROUTINE X 2)
Culture: NO GROWTH
Culture: NO GROWTH

## 2023-12-01 LAB — GLUCOSE, CAPILLARY
Glucose-Capillary: 301 mg/dL — ABNORMAL HIGH (ref 70–99)
Glucose-Capillary: 147 mg/dL — ABNORMAL HIGH (ref 70–99)
Glucose-Capillary: 137 mg/dL — ABNORMAL HIGH (ref 70–99)

## 2023-12-01 LAB — CBC
HCT: 25.1 % — ABNORMAL LOW (ref 39.0–52.0)
Hemoglobin: 7.9 g/dL — ABNORMAL LOW (ref 13.0–17.0)
MCH: 27 pg (ref 26.0–34.0)
MCHC: 31.5 g/dL (ref 30.0–36.0)
MCV: 85.7 fL (ref 80.0–100.0)
Platelets: 361 K/uL (ref 150–400)
RBC: 2.93 MIL/uL — ABNORMAL LOW (ref 4.22–5.81)
RDW: 17.6 % — ABNORMAL HIGH (ref 11.5–15.5)
WBC: 6.7 K/uL (ref 4.0–10.5)
nRBC: 0 % (ref 0.0–0.2)

## 2023-12-01 LAB — BASIC METABOLIC PANEL WITH GFR
Anion gap: 9 (ref 5–15)
BUN: 56 mg/dL — ABNORMAL HIGH (ref 8–23)
CO2: 26 mmol/L (ref 22–32)
Calcium: 8.4 mg/dL — ABNORMAL LOW (ref 8.9–10.3)
Chloride: 96 mmol/L — ABNORMAL LOW (ref 98–111)
Creatinine, Ser: 2.32 mg/dL — ABNORMAL HIGH (ref 0.61–1.24)
GFR, Estimated: 31 mL/min — ABNORMAL LOW (ref >60)
Glucose, Bld: 187 mg/dL — ABNORMAL HIGH (ref 70–99)
Potassium: 4.7 mmol/L (ref 3.5–5.1)
Sodium: 131 mmol/L — ABNORMAL LOW (ref 135–145)
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
## 2023-12-01 NOTE — TOC Progression Note (Signed)
 Transition of Care [facility]) - Progression Note    Patient Details  Name: Derek Thompson MRN: [id number] Date of Birth: April 19, 1961  Transition of Care [facility]) CM/SW Contact  [name] [place], [name] Phone Number: 12/01/2023, 4:04 PM  Clinical Narrative:     [name], union rep, emailed CSW Short term disability continuation form. Provider and patient completed their portions of the form. Form emailed back  to [place].   Expected Discharge Plan: Skilled Nursing Facility Barriers to Discharge: Inadequate or no insurance               Expected Discharge Plan and Services In-house Referral: Clinical Social Work   Post Acute Care Choice: Skilled Nursing Facility Living arrangements for the past 2 months: Homeless Shelter                                       Social Drivers of Health (SDOH) Interventions SDOH Screenings   Food Insecurity: No Food Insecurity (11/02/2023)  Housing: High Risk (11/02/2023)  Transportation Needs: Unmet Transportation Needs (11/02/2023)  Utilities: Not At Risk (11/02/2023)  Alcohol Screen: Low Risk  (08/19/2022)  Depression (PHQ2-9): Low Risk  (10/11/2023)  Financial Resource Strain: Low Risk  (08/19/2022)  Tobacco Use: Medium Risk (11/17/2023)    Readmission Risk Interventions     No data to display

## 2023-12-01 NOTE — Progress Notes (Signed)
 Occupational Therapy Treatment Patient Details Name: Derek Thompson MRN: [id number] DOB: 02-Mar-1961 Today's Date: 12/01/2023   History of present illness Pt is a 63 y/o M presenting to ED on 11/02/23 with lethargy. Pt found to be tachycardic, hyperkalemic, and hyponatremic. 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   OT comments  Pt initially agreeable to practicing lateral scoot transfer to drop arm recliner. Upon sitting EOB with supervision, pt declined, opting for standing with RW which he completed x 3 from elevated bed with min assist. Demonstrates poor insight into steps needed for him to function mod I while awaiting a prosthetic. Appears to think he should already be being fitted for prothesis. Educated that residual limb must be completely healed for weightbearing. Patient will benefit from continued inpatient follow up therapy, <3 hours/day.      If plan is discharge home, recommend the following:  A lot of help with bathing/dressing/bathroom;Assistance with cooking/housework;Direct supervision/assist for medications management;Direct supervision/assist for financial management;Assist for transportation;Help with stairs or ramp for entrance;A little help with walking and/or transfers   Equipment Recommendations   (drop arm BSC)    Recommendations for Other Services      Precautions / Restrictions Precautions Precautions: Fall;Other (comment) Precaution/Restrictions Comments: R limb guard protector Restrictions Weight Bearing Restrictions Per Provider Order: Yes RLE Weight Bearing Per Provider Order: Non weight bearing       Mobility Bed Mobility Overal bed mobility: Needs Assistance Bed Mobility: Supine to  Sit, Sit to Supine     Supine to sit: Supervision Sit to supine: Supervision   General bed mobility comments: increased time. no physical assistance.    Transfers Overall transfer level: Needs assistance Equipment used: Rolling walker (2 wheels) Transfers: Sit to/from Stand Sit to Stand: Min assist           General transfer comment: stood from elevated bed with assist to rise and steady and cues for hand placement     Balance Overall balance assessment: Needs assistance   Sitting balance-Leahy Scale: Good       Standing balance-Leahy Scale: Poor                             ADL either performed or assessed with clinical judgement   ADL                                       Functional mobility during ADLs: Minimal assistance;Rolling walker (2 wheels) General ADL Comments: Educated in option of lateral scoot transfer, pt agreed to attempt. Upon sitting EOB he declined stating it was more important than he continue standing to prepare for getting his prosthesis. Pt educated that he could do both. Unwilling to remain in chair even with geomat cushion.    Extremity/Trunk Assessment              Vision       Restaurant manager, fast food Communication: No apparent difficulties   Cognition Arousal: Alert Behavior During Therapy: WFL for tasks assessed/performed, Flat affect Cognition: Cognition impaired     Awareness: Intellectual awareness intact,  Online awareness impaired Memory impairment (select all impairments): Short-term memory   Executive functioning impairment (select all impairments): Problem solving OT - Cognition Comments: poor insight in importance of participating in OOB and ADLs to progress to independence                 Following commands: Impaired Following commands impaired: Follows one step commands with increased time      Cueing   Cueing Techniques: Verbal cues   Exercises      Shoulder Instructions       General Comments      Pertinent Vitals/ Pain       Pain Assessment Pain Assessment: No/denies pain  Home Living                                          Prior Functioning/Environment              Frequency  Min 2X/week        Progress Toward Goals  OT Goals(current goals can now be found in the care plan section)  Progress towards OT goals: Not progressing toward goals - comment  Acute Rehab OT Goals OT Goal Formulation: With patient Time For Goal Achievement: 12/04/23 Potential to Achieve Goals: Good  Plan      Co-evaluation                 AM-PAC OT 6 Clicks Daily Activity     Outcome Measure   Help from another person eating meals?: None Help from another person taking care of personal grooming?: A Little Help from another person toileting, which includes using toliet, bedpan, or urinal?: A Lot Help from another person bathing (including washing, rinsing, drying)?: A Lot Help from another person to put on and taking off regular upper body clothing?: A Little Help from another person to put on and taking off regular lower body clothing?: A Lot 6 Click Score: 16    End of Session Equipment Utilized During Treatment: Gait belt;Rolling walker (2 wheels)  OT Visit Diagnosis: Unsteadiness on feet (R26.81);Other abnormalities of gait and mobility (R26.89);Muscle weakness (generalized) (M62.81);Other symptoms and signs involving cognitive function   Activity Tolerance Patient tolerated treatment well   Patient Left in bed;with call bell/phone within reach;with chair alarm set   Nurse Communication          Time: 1545-1600 OT Time Calculation (min): 15 min  Charges: OT General Charges $OT Visit: 1 Visit OT Treatments $Therapeutic Activity: 8-22 mins  [name], OTR/L Acute Rehabilitation Services Office: [phone]   [name] 12/01/2023, 4:09 PM

## 2023-12-01 NOTE — Progress Notes (Signed)
 HD#[id number] SUBJECTIVE:  Patient Summary: Derek Thompson is a 63 y.o. with a pertinent PMH of t PMH of End-stage HFrEF, CKD, CVA, bladder outlet obstruction s/p coude foley catheter during last admission 10/2023, and chronic osteomyelitis, admitted hypovolemic hyponatremia and AKI with coude foley and on CTX, resolved. Waiting for SNF.   Overnight Events: N/A  Interim History: No concerns at this time. The patient is sleeping and eating well.   OBJECTIVE:  Vital Signs: Vitals:   11/30/23 1812 11/30/23 2006 12/01/23 0413 12/01/23 0813  BP: 101/65 (!) 97/57 123/74 109/65  Pulse: 83 85 (!) 108 (!) 102  Resp: 20 18 18 18   Temp:  99 F (37.2 C) 98.7 F (37.1 C) 99.8 F (37.7 C)  TempSrc:  Oral Oral Oral  SpO2:  100% 97% 99%  Weight:      Height:       Supplemental O2: Room Air SpO2: 99 % O2 Flow Rate (L/min): 5 L/min  Filed Weights   11/26/23 0514 11/27/23 0519 11/29/23 0420  Weight: 61 kg 59.4 kg 60.2 kg     Intake/Output Summary (Last 24 hours) at 12/01/2023 1513 Last data filed at 12/01/2023 1155 Gross per 24 hour  Intake 640 ml  Output 1500 ml  Net -860 ml   Net IO Since Admission: -24,252.81 mL [12/01/23 1513]  Physical Exam: Physical Exam Const: Awake, alert in NAD HENT: Normocephalic, atraumatic, Card: tachycardic, No pitting edema on LE's bilaterally  Resp: LCTAB, No increased work of breathing Abd: NTND, No suprapubic tenderness noted.  Extremities: RLE BKA with would vac and rigid BKA protector in place Patient Lines/Drains/Airways Status     Active Line/Drains/Airways     Name Placement date Placement time Site Days   Peripheral IV 11/29/23 20 G 1 Left;Posterior Forearm 11/29/23  1231  Forearm  2   Urethral Catheter Mekides Nida, RN Coude 16 Fr. 11/21/23  1522  Coude  10   Wound 11/16/23 0826 Surgical  Sacrum Left 11/16/23  0826  Sacrum  15   Wound 11/17/23 1030 Surgical Closed Surgical Incision Knee Right 11/17/23  1030  Knee  14             Pertinent labs and imaging:      Latest Ref Rng & Units 12/01/2023    6:00 AM 11/30/2023    5:29 AM 11/29/2023    4:54 AM  CBC  WBC 4.0 - 10.5 K/uL 6.7  6.5  6.7   Hemoglobin 13.0 - 17.0 g/dL 7.9  7.4  7.3   Hematocrit 39.0 - 52.0 % 25.1  23.3  22.3   Platelets 150 - 400 K/uL 361  299  306        Latest Ref Rng & Units 12/01/2023    6:00 AM 11/30/2023    5:29 AM 11/29/2023    4:54 AM  CMP  Glucose 70 - 99 mg/dL 812  845  97   BUN 8 - 23 mg/dL 56  53  57   Creatinine 0.61 - 1.24 mg/dL 7.67  7.56  7.72   Sodium 135 - 145 mmol/L 131  130  128   Potassium 3.5 - 5.1 mmol/L 4.7  4.9  5.2   Chloride 98 - 111 mmol/L 96  96  96   CO2 22 - 32 mmol/L 26  22  24    Calcium  8.9 - 10.3 mg/dL 8.4  8.2  8.6     No results found.  ASSESSMENT/PLAN:  Assessment: Principal Problem:  Osteomyelitis of right foot (HCC) Active Problems:   CAD (coronary artery disease)   Hyperkalemia   AKI (acute kidney injury) (HCC)   Anemia   LV (left ventricular) mural thrombus without MI (HCC)   Gangrene of right foot (HCC)   Hyponatremia   HFrEF (heart failure with reduced ejection fraction) (HCC)   Chronic osteomyelitis involving lower leg, right (HCC)   Frailty   Urinary retention   Generalized weakness   Sinus tachycardia   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 (HCC)   Chronic anemia   Hypoalbuminemia   Monoclonal gammopathy   Hx of right BKA (HCC)   Plan: Patient is medically stable and cleared for discharge by internal medicine. Currently uninsured and awaiting insurance approval for transfer to a skilled nursing facility (SNF).   CAUTI Resolved, Patient reports no dysuria or fever. 7 day Ceftriaxone  completed. Plan: Maintain Coude catheter; may remain for up to 30 days per urology guidance Monitor for signs of infection or clinical changes Follow-up urine studies if clinically indicated   Palpitation: Resolved.   Chronic osteomyelitis R foot PAD S/P BKA  11/17/23 -POD 10 R BKA with Dr. Harden. Denies pain at surgical site. Wound was clen without redness, swelling or discharge. -Dry dressing    Elevated Protein gap  SPEP with Positive M-Spike SPEP shows gamma globulin of 2.1 and an M-spike of 1.1. The kappa/lambda light chain ratio is 0.58. IgG is elevated at 2,664 with lambda specificity. UPEP reveals positive Bence Jones protein. Beta-2 microglobulin is elevated at 7.2. Bone marrow biopsy from 7/17 indicates CD56 positivity in a subset of aberrant plasma cells, consistent with a neoplastic process. These findings are suggestive of multiple myeloma. Hematology (Dr. Autumn) has been consulted and will manage the patient on an outpatient basis. This diagnosis likely explains the patient's ongoing issues, including renal dysfunction and electrolyte abnormalities.  Plan: -Continue outpatient follow-up with hematology (Dr. Autumn) -Monitor renal function and electrolytes closely -Supportive care as needed for renal impairment   Hyponatremia Sodium is 131 Likely multifactorial, including MM, cardiorenal syndrome, AKI, bladder outlet obstruction, and hypovolemia.  -High suspicion for pseudohyponatremia due to presence of monoclonal gammopathy.    Debility Functional Deconditioning Patient is currently hospitalized day 30 with significantly reduced mobility, spending most of the time in bed. -Continue physical and occupational therapy.   AKI Bladder outlet obstruction with BL hydroureter NAGMA Since admission, AKI has significantly improved. Developed post-ATN diuresis, Will keep coude catheter in place. Per urology, needs to be replaced every 30 days.  -Renal function likely impacted by monoclonal gammopathy of renal significance. Hematology following -Sodium Bicarb 1300 to TID for persistent NAGMA.      Biventricular HFrEF EF 20-25%.  Persistent Tacchycardia Hx of nonobstructive CAD. Nonadherent to medications. Not a candidate for advanced  therapies. Suspicion of prior MI but none documented in the chart. Remains peripherally euvolemic.  - Monitor electrolytes; goal Mg 2, K ~4. - Monitor urine output. - Continue metoprolol  25mg  BID   T2DM -Continue SSI with sensitive (0-9 units) and 15 semglee     Hx of LV thrombus (not visualized on TTE and MR 10/2023) New subscapular splenic infarct -Contine Eliquis  5mg  BID   Acute on chronic anemia -Hb:7.9 -Low sat ratios during last hospitalization.  -S/p IV Iron  7/8. Will continue daily CBC's. Transfuse if Hbg <7   Multisystem organ failure Goals of care Palliative Consult Severe protein calorie malnutrition -Discussed multisystem organ failure and recurrent hospitalizations -Palliative care saw pt and  pt expressed desire to continue to be full code, full scope.  -At this time, he and his family wish to continue aggressive care. -Per PT eval, Pt to d/c to SNF, waiting for insurance    Best Practice: Diet: Renal diet IVF: None VTE: Eliquis  5 mg BID Code: Full   Disposition planning: Family Contact: children, called and notified. DISPO: Anticipated discharge in 4 days to Skilled nursing facility pending for insurance.   Signature:  Gabriellia Rempel [facility]  3:13 PM, 12/01/2023  On Call pager [phone]

## 2023-12-01 NOTE — Progress Notes (Signed)
 Physical Therapy Treatment Patient Details Name: Derek Thompson MRN: [id number] DOB: 1960/11/11 Today's Date: 12/01/2023   History of Present Illness Pt is a 63 y/o M presenting to ED on 11/02/23 with lethargy. Pt found to be tachycardic, hyperkalemic, and hyponatremic. 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    PT Comments  Patient is agreeable to PT session. He continues to require assistance for short distance ambulation using rolling walker. No dizziness is reported but patient is fatigued with minimal activity. Patient continues to decline sitting up in the chair. Recommend to continue PT to maximize independence and decrease caregiver burden. Rehabilitation < 3 hours/day recommended after this hospital stay.    If plan is discharge home, recommend the following: A lot of help with bathing/dressing/bathroom;Supervision due to cognitive status;Help with stairs or ramp for entrance;A lot of help with walking and/or transfers;Assistance with cooking/housework;Assist for transportation   Can travel by private vehicle     No  Equipment Recommendations  Rolling walker (2 wheels);BSC/3in1;Wheelchair (measurements PT);Wheelchair cushion (measurements PT)    Recommendations for Other Services       Precautions / Restrictions Precautions Precautions: Fall;Other (comment) Recall of Precautions/Restrictions: Impaired Precaution/Restrictions Comments: R limb guard protector Restrictions Weight Bearing Restrictions Per Provider Order: Yes RLE Weight Bearing Per Provider Order: Non weight bearing     Mobility  Bed Mobility Overal bed mobility: Needs Assistance Bed Mobility: Supine to Sit, Sit to Supine Rolling: Modified  independent (Device/Increase time)     Sit to supine: Supervision   General bed mobility comments: increased time. no physical assistance.    Transfers Overall transfer level: Needs assistance Equipment used: Rolling walker (2 wheels) Transfers: Sit to/from Stand Sit to Stand: Min assist           General transfer comment: lifting assistance required for standing. cues for hand placement with sitting and standing. limb protector donned with mobility with maximal assistance from therapist    Ambulation/Gait Ambulation/Gait assistance: Min assist Gait Distance (Feet): 8 Feet Assistive device: Rolling walker (2 wheels) Gait Pattern/deviations:  (hop to pattern) Gait velocity: decreased     General Gait Details: cues for technique using rolling walker and hop pattern. steadying assistance required especially with turning rolling walker. patient fatigues quickly and may benefit from chair follow for further distance ambulation for safety   Stairs             Wheelchair Mobility     Tilt Bed    Modified Rankin (Stroke Patients Only)       Balance Overall balance assessment: Needs assistance Sitting-balance support: No upper extremity supported, Feet supported Sitting balance-Leahy Scale: Good     Standing balance support: Bilateral upper extremity supported, During functional activity, Reliant on assistive device for balance Standing balance-Leahy Scale: Poor Standing balance comment: heavy relaince on rolling walker. increased assistance for dynamic activity                            Communication Communication Communication: No apparent difficulties  Cognition Arousal: Alert Behavior During Therapy: WFL for tasks assessed/performed, Flat affect   PT - Cognitive impairments: Initiation, Sequencing  Following commands: Impaired (mild delay) Following commands impaired: Follows one step commands with  increased time    Cueing Cueing Techniques: Verbal cues  Exercises      General Comments        Pertinent Vitals/Pain Pain Assessment Pain Assessment: No/denies pain    Home Living                          Prior Function            PT Goals (current goals can now be found in the care plan section) Acute Rehab PT Goals Patient Stated Goal: to get to rehab PT Goal Formulation: With patient Time For Goal Achievement: 12/15/23 Potential to Achieve Goals: Good Progress towards PT goals: Progressing toward goals (care plan extended x 2 weeks)    Frequency    Min 2X/week      PT Plan      Co-evaluation              AM-PAC PT 6 Clicks Mobility   Outcome Measure  Help needed turning from your back to your side while in a flat bed without using bedrails?: A Little Help needed moving from lying on your back to sitting on the side of a flat bed without using bedrails?: A Little Help needed moving to and from a bed to a chair (including a wheelchair)?: A Little Help needed standing up from a chair using your arms (e.g., wheelchair or bedside chair)?: A Little Help needed to walk in hospital room?: Total Help needed climbing 3-5 steps with a railing? : Total 6 Click Score: 14    End of Session   Activity Tolerance: Patient tolerated treatment well Patient left: in bed;with call bell/phone within reach;with bed alarm set Nurse Communication: Mobility status PT Visit Diagnosis: Unsteadiness on feet (R26.81);Difficulty in walking, not elsewhere classified (R26.2);Other abnormalities of gait and mobility (R26.89)     Time: 1038-1050 PT Time Calculation (min) (ACUTE ONLY): 12 min  Charges:    $Therapeutic Activity: 8-22 mins PT General Charges $$ ACUTE PT VISIT: 1 Visit                    Derek Thompson, PT, MPT    Derek LULLA Thompson 12/01/2023, 11:33 AM

## 2023-12-02 DIAGNOSIS — E440 Moderate protein-calorie malnutrition: Secondary | ICD-10-CM

## 2023-12-02 DIAGNOSIS — E1169 Type 2 diabetes mellitus with other specified complication: Secondary | ICD-10-CM | POA: Diagnosis not present

## 2023-12-02 DIAGNOSIS — M86171 Other acute osteomyelitis, right ankle and foot: Secondary | ICD-10-CM | POA: Diagnosis not present

## 2023-12-02 DIAGNOSIS — R000 Tachycardia, unspecified: Secondary | ICD-10-CM | POA: Diagnosis not present

## 2023-12-02 DIAGNOSIS — E1151 Type 2 diabetes mellitus with diabetic peripheral angiopathy without gangrene: Secondary | ICD-10-CM | POA: Diagnosis not present

## 2023-12-02 LAB — GLUCOSE, CAPILLARY
Glucose-Capillary: 99 mg/dL (ref 70–99)
Glucose-Capillary: 166 mg/dL — ABNORMAL HIGH (ref 70–99)
Glucose-Capillary: 192 mg/dL — ABNORMAL HIGH (ref 70–99)
Glucose-Capillary: 148 mg/dL — ABNORMAL HIGH (ref 70–99)

## 2023-12-02 NOTE — Progress Notes (Signed)
 HD#29 Subjective:   Summary: This is a 63 year old male with past medical history of HFrEF, urinary retention with chronic Foley, chronic osteomyelitis of right foot who presented with concerns of lethargy found to have chronic osteo.  He is now status post right BKA and now medically ready for discharge awaiting SNF placement.  Overnight Events: No acute events overnight  Patient evaluated at bedside this morning.  Discussed his social situation with him.  We were able to identify why he lost his insurance.  Apparently he was to renew his short-term disability, but did not and it expired in April 2025.  He understood this, and is now working on getting his short-term disability insurance back.  He denies any chest pain or shortness of breath.  He states he is doing well this morning.  Objective:  Vital signs in last 24 hours: Vitals:   12/01/23 0813 12/01/23 1625 12/01/23 2023 12/02/23 0437  BP: 109/65 (!) 96/58 109/71 105/64  Pulse: (!) 102 81 90 93  Resp: 18 19 18 18   Temp: 99.8 F (37.7 C) 98.6 F (37 C) 98.4 F (36.9 C) 99.3 F (37.4 C)  TempSrc: Oral Oral    SpO2: 99%  100% 98%  Weight:      Height:       Supplemental O2: Room Air  Physical Exam:  Constitutional: well-appearing in bed, no acute distress Cardiovascular: Tachycardic rate, regular rhythm, no murmurs rubs or gallops Pulmonary/Chest: normal work of breathing on room air, lungs clear to auscultation bilaterally Abdominal: soft, non-tender, non-distended MSK: Right BKA appreciated, staples healing  Filed Weights   11/26/23 0514 11/27/23 0519 11/29/23 0420  Weight: 61 kg 59.4 kg 60.2 kg     Intake/Output Summary (Last 24 hours) at 12/02/2023 0624 Last data filed at 12/02/2023 0453 Gross per 24 hour  Intake 1120 ml  Output 2100 ml  Net -980 ml   Net IO Since Admission: -25,272.81 mL [12/02/23 0624]  Pertinent Labs:    Latest Ref Rng & Units 12/01/2023    6:00 AM 11/30/2023    5:29 AM 11/29/2023     4:54 AM  CBC  WBC 4.0 - 10.5 K/uL 6.7  6.5  6.7   Hemoglobin 13.0 - 17.0 g/dL 7.9  7.4  7.3   Hematocrit 39.0 - 52.0 % 25.1  23.3  22.3   Platelets 150 - 400 K/uL 361  299  306        Latest Ref Rng & Units 12/01/2023    6:00 AM 11/30/2023    5:29 AM 11/29/2023    4:54 AM  CMP  Glucose 70 - 99 mg/dL 812  845  97   BUN 8 - 23 mg/dL 56  53  57   Creatinine 0.61 - 1.24 mg/dL 7.67  7.56  7.72   Sodium 135 - 145 mmol/L 131  130  128   Potassium 3.5 - 5.1 mmol/L 4.7  4.9  5.2   Chloride 98 - 111 mmol/L 96  96  96   CO2 22 - 32 mmol/L 26  22  24    Calcium  8.9 - 10.3 mg/dL 8.4  8.2  8.6     Imaging: No results found.  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 male with past medical history of HFrEF, urinary retention with chronic Foley, chronic osteomyelitis of right foot who presented with concerns of lethargy found to have chronic osteo.  He is now status post right BKA and now medically ready for discharge awaiting SNF placement.  #Status post BKA of right foot #Chronic osteomyelitis of right foot, resolved Patient is postop day 11.  Doing well.  Wound VAC is off.  No concerns for drainage, swelling, discharge.  Will contact nurse to see if we can get staples removed. - Dry dressing Remove staples - PT/OT following, recommending  SNF  #CAUTI Patient had UTI during hospitalization.  Changed out catheter.  Last catheter change was 07/22/ 2025.  No acute concerns anymore.  Completed treatment with ceftriaxone . - Can follow-up outpatient for catheter management  #Concern for multiple myeloma Patient had SPEP with positive M spike as well as elevated protein gap.  Bone marrow biopsy finding concerns of multiple myeloma.  Can follow-up outpatient. - Consulted oncology who will follow-up with patient outpatient - Monitor renal function and support electrolyte status  #Hyponatremia Chronically low sodium, likely in setting of multiple myeloma with gammopathy.  No acute concerns. - Lab holiday today - Follow-up BMP tomorrow  #AKI on suspected CKD #Hyperkalemia Creatinine yesterday at 2.32.  Seems to be at baseline.  I do wonder if he has a component of CKD.  Will continue to trend kidney function. - Can follow kidney function tomorrow - Patient does have hyperkalemia at times, on Lokelma   #HFrEF Echo 10/12/2023 showed EF of 20 to 25%.  Patient seems  euvolemic on exam.  No concern for acute exacerbation.  Patient is on metoprolol  to tartrate 25 mg twice daily, however we should probably transition to 50 mg daily as succinate as shown to improve mortality morbidity in HFrEF patients. - Transition to metoprolol  succinate 50 mg daily -If renal function does start to improve, can potentially start SGLT2 and spironolactone  or Entresto   #History of LV thrombus #New subscapular splenic infarct - No acute concerns at this time - Continue Eliquis  5 mg twice daily  #Type 2 diabetes mellitus - Medications at this time currently include Semglee  15 units daily, NovoLog  3 units 3 times daily with meals, as well as sliding scale.  Sugars have been measuring well. Plan continue carb modified diet - Continue current insulin  regimen - Follow-up A1c outpatient when patient is due    Diet: Normal IVF: None,None VTE: DOAC Code:  Full PT/OT recs: SNF for Subacute PT TOC recs: Getting paperwork completed for short term disability  Family Update:   Dispo: Anticipated discharge to Skilled nursing facility in 5 days pending placement.   Libby Blanch DO Internal Medicine Resident PGY-3 Please contact the on call pager after 5 pm and on weekends at [phone].

## 2023-12-03 DIAGNOSIS — R000 Tachycardia, unspecified: Secondary | ICD-10-CM | POA: Diagnosis not present

## 2023-12-03 DIAGNOSIS — E1169 Type 2 diabetes mellitus with other specified complication: Secondary | ICD-10-CM | POA: Diagnosis not present

## 2023-12-03 DIAGNOSIS — M86171 Other acute osteomyelitis, right ankle and foot: Secondary | ICD-10-CM | POA: Diagnosis not present

## 2023-12-03 DIAGNOSIS — E1151 Type 2 diabetes mellitus with diabetic peripheral angiopathy without gangrene: Secondary | ICD-10-CM | POA: Diagnosis not present

## 2023-12-03 LAB — CBC
HCT: 24.3 % — ABNORMAL LOW (ref 39.0–52.0)
Hemoglobin: 7.6 g/dL — ABNORMAL LOW (ref 13.0–17.0)
MCH: 27.4 pg (ref 26.0–34.0)
MCHC: 31.3 g/dL (ref 30.0–36.0)
MCV: 87.7 fL (ref 80.0–100.0)
Platelets: 342 K/uL (ref 150–400)
RBC: 2.77 MIL/uL — ABNORMAL LOW (ref 4.22–5.81)
RDW: 17.3 % — ABNORMAL HIGH (ref 11.5–15.5)
WBC: 6.3 K/uL (ref 4.0–10.5)
nRBC: 0 % (ref 0.0–0.2)

## 2023-12-03 LAB — BASIC METABOLIC PANEL WITH GFR
Anion gap: 10 (ref 5–15)
BUN: 53 mg/dL — ABNORMAL HIGH (ref 8–23)
CO2: 26 mmol/L (ref 22–32)
Calcium: 8.3 mg/dL — ABNORMAL LOW (ref 8.9–10.3)
Chloride: 96 mmol/L — ABNORMAL LOW (ref 98–111)
Creatinine, Ser: 2.16 mg/dL — ABNORMAL HIGH (ref 0.61–1.24)
GFR, Estimated: 33 mL/min — ABNORMAL LOW (ref >60)
Glucose, Bld: 133 mg/dL — ABNORMAL HIGH (ref 70–99)
Potassium: 4.2 mmol/L (ref 3.5–5.1)
Sodium: 132 mmol/L — ABNORMAL LOW (ref 135–145)

## 2023-12-03 LAB — GLUCOSE, CAPILLARY
Glucose-Capillary: 144 mg/dL — ABNORMAL HIGH (ref 70–99)
Glucose-Capillary: 247 mg/dL — ABNORMAL HIGH (ref 70–99)
Glucose-Capillary: 205 mg/dL — ABNORMAL HIGH (ref 70–99)
Glucose-Capillary: 251 mg/dL — ABNORMAL HIGH (ref 70–99)

## 2023-12-03 MED ORDER — HYDROXYZINE HCL 10 MG PO TABS
10.0000 mg | ORAL_TABLET | Freq: Three times a day (TID) | ORAL | Status: DC | PRN
  Administered 2023-12-03 – 2023-12-29 (×13): 10 mg via ORAL
  Filled 2023-12-03 (×13): qty 1

## 2023-12-03 MED ORDER — METOPROLOL SUCCINATE ER 50 MG PO TB24
50.0000 mg | ORAL_TABLET | Freq: Every day | ORAL | Status: DC
  Administered 2023-12-04 – 2023-12-29 (×27): 50 mg via ORAL
  Filled 2023-12-03 (×26): qty 1

## 2023-12-03 MED ORDER — METOPROLOL TARTRATE 25 MG PO TABS
25.0000 mg | ORAL_TABLET | Freq: Two times a day (BID) | ORAL | Status: AC
  Administered 2023-12-03: 25 mg via ORAL
  Filled 2023-12-03: qty 1

## 2023-12-03 NOTE — Progress Notes (Signed)
 HD#30 Subjective:   Summary: This is a 63 year old male with past medical history of HFrEF, urinary retention with chronic Foley, chronic osteomyelitis of right foot who presented with concerns of lethargy found to have chronic osteo.  He is now status post right BKA and now medically ready for discharge awaiting SNF placement.  Overnight Events: No acute events overnight  Patient evaluated at bedside this morning.  He denies any chest pain, shortness of breath.  He states his pain is well-managed.  He has had a bowel movement.  He has no concerns morning.  Objective:  Vital signs in last 24 hours: Vitals:   12/02/23 0800 12/02/23 1642 12/02/23 2032 12/03/23 0517  BP: 113/72 108/66 104/62 (!) 98/59  Pulse: (!) 102 85 90 87  Resp: 18 19 20 18   Temp: 98 F (36.7 C) 98.2 F (36.8 C) 99 F (37.2 C) 98.5 F (36.9 C)  TempSrc:   Oral Oral  SpO2: 100% 99% 99% 99%  Weight:      Height:       Supplemental O2: Room Air  Physical Exam:  Constitutional: well-appearing in bed, no acute distress Cardiovascular: Regular rate and rhythm, no murmurs, rubs, or gallops Pulmonary/Chest: normal work of breathing on room air, lungs clear to auscultation bilaterally Abdominal: soft, non-tender, non-distended MSK: Right BKA appreciated, staples in place and surgical site healing well  Filed Weights   11/26/23 0514 11/27/23 0519 11/29/23 0420  Weight: 61 kg 59.4 kg 60.2 kg     Intake/Output Summary (Last 24 hours) at 12/03/2023 0611 Last data filed at 12/03/2023 0137 Gross per 24 hour  Intake 1200 ml  Output 2900 ml  Net -1700 ml   Net IO Since Admission: -73,027.81 mL [12/03/23 0611]  Pertinent Labs:    Latest Ref Rng & Units 12/03/2023    4:54 AM 12/01/2023    6:00 AM 11/30/2023    5:29 AM  CBC  WBC 4.0 - 10.5 K/uL 6.3  6.7  6.5   Hemoglobin 13.0 - 17.0 g/dL 7.6  7.9  7.4   Hematocrit 39.0 - 52.0 % 24.3  25.1  23.3   Platelets 150 - 400 K/uL 342  361  299        Latest Ref Rng &  Units 12/01/2023    6:00 AM 11/30/2023    5:29 AM 11/29/2023    4:54 AM  CMP  Glucose 70 - 99 mg/dL 812  845  97   BUN 8 - 23 mg/dL 56  53  57   Creatinine 0.61 - 1.24 mg/dL 7.67  7.56  7.72   Sodium 135 - 145 mmol/L 131  130  128   Potassium 3.5 - 5.1 mmol/L 4.7  4.9  5.2   Chloride 98 - 111 mmol/L 96  96  96   CO2 22 - 32 mmol/L 26  22  24    Calcium  8.9 - 10.3 mg/dL 8.4  8.2  8.6     Imaging: No results found.  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 male with past medical history of HFrEF, urinary retention with chronic Foley, chronic osteomyelitis of right foot who presented with concerns of lethargy found to have chronic osteo.  He is now status post right BKA and now medically ready for discharge awaiting SNF placement.  #Status post BKA of right foot #Chronic osteomyelitis of right foot, resolved Patient is postop day 11.  He is doing well.  Will try to get staples out today.  If nurses unable to do so, will see if orthopedics can get staples out tomorrow.  - Dry dressing - Remove staples - PT/OT following, recommending SNF  #CAUTI Patient had UTI during hospitalization.  Changed out catheter.  Last catheter change was 07/22/ 2025.  No acute concerns anymore.  Completed treatment with  ceftriaxone . - Can follow-up outpatient for catheter management  #Concern for multiple myeloma Patient had SPEP with positive M spike as well as elevated protein gap.  Bone marrow biopsy finding concerns of multiple myeloma.  Can follow-up outpatient. - Consulted oncology who will follow-up with patient outpatient - Monitor renal function and support electrolyte status  #Hyponatremia Chronically low sodium, likely in setting of multiple myeloma with gammopathy.  No acute concerns.  Sodium today is 132. - Continue to monitor BMP -Lab holiday tomorrow  #AKI on suspected CKD #Hyperkalemia Creatinine today at 2.16.  Seems to be at baseline.  I do wonder if he has a component of CKD.  Will continue to trend kidney function.  Potassium 4.2.  Continue monitoring electrolytes and creatinine. - Can follow kidney function tomorrow - Patient does have hyperkalemia at times, on Lokelma   #HFrEF Echo 10/12/2023 showed EF of 20 to 25%.  Patient seems  euvolemic on exam.  No concern for acute exacerbation.  Patient is on metoprolol  tartrate 25 mg twice daily, however we should probably transition to 50 mg daily as succinate as shown to improve mortality morbidity in HFrEF patients. - Transition to metoprolol  succinate 50 mg daily starting tomorrow - If renal function does start to improve, can potentially start SGLT2 and spironolactone  or Entresto   #History of LV thrombus #New subscapular splenic infarct - No acute concerns at this time - Continue Eliquis  5 mg twice daily  #Type 2 diabetes mellitus - Medications at this time currently include Semglee  15 units daily, NovoLog  3 units 3 times daily with meals, as well as sliding scale.  Sugars have been measuring well. Plan continue carb modified diet - Continue current insulin  regimen - Follow-up A1c outpatient when patient is due  #Social situation Tough social  situation.  Patient is still working on getting short-term disability and insurance to  get approved for SNF.  Will continue to follow for progression.  He is medically ready for discharge.  Awaiting safe dispo plan.  Diet: Normal IVF: None,None VTE: DOAC Code: Full PT/OT recs: SNF for Subacute PT TOC recs: Getting paperwork completed for short term disability  Family Update:   Dispo: Anticipated discharge to Skilled nursing facility in 5 days pending placement.   Libby Blanch DO Internal Medicine Resident PGY-3 Please contact the on call pager after 5 pm and on weekends at [phone].

## 2023-12-04 LAB — GLUCOSE, CAPILLARY
Glucose-Capillary: 82 mg/dL (ref 70–99)
Glucose-Capillary: 142 mg/dL — ABNORMAL HIGH (ref 70–99)
Glucose-Capillary: 58 mg/dL — ABNORMAL LOW (ref 70–99)
Glucose-Capillary: 97 mg/dL (ref 70–99)
Glucose-Capillary: 139 mg/dL — ABNORMAL HIGH (ref 70–99)

## 2023-12-04 MED ORDER — METFORMIN HCL 500 MG PO TABS: 1000.0000 mg | ORAL_TABLET | Freq: Every day | ORAL | Status: DC

## 2023-12-04 MED ORDER — DAPAGLIFLOZIN PROPANEDIOL 10 MG PO TABS
10.0000 mg | ORAL_TABLET | Freq: Every day | ORAL | Status: DC
  Administered 2023-12-05 – 2023-12-26 (×25): 10 mg via ORAL
  Filled 2023-12-04 (×22): qty 1

## 2023-12-04 MED ORDER — METFORMIN HCL ER 500 MG PO TB24
1000.0000 mg | ORAL_TABLET | Freq: Every day | ORAL | Status: DC
  Administered 2023-12-05 – 2023-12-15 (×14): 1000 mg via ORAL
  Filled 2023-12-04 (×13): qty 2

## 2023-12-04 NOTE — Progress Notes (Signed)
 HD#31 SUBJECTIVE:  Patient Summary: This is a 63 year old male with past medical history of HFrEF, urinary retention with chronic Foley, chronic osteomyelitis of right foot who presented with concerns of lethargy found to have chronic osteo.  He is now status post right BKA and now medically ready for discharge awaiting SNF placement.    Overnight Events: N/A  Interim History: Patient evaluated at bedside this morning. He denies any chest pain, shortness of breath. He states his pain is well-managed. He has had a bowel movement. He has no concerns morning just wanted to discharge from hospital sooner.  OBJECTIVE:  Vital Signs: Vitals:   12/03/23 1737 12/03/23 2031 12/04/23 0509 12/04/23 0803  BP: 118/73 110/63 (!) 101/52 110/68  Pulse: 90 87 (!) 110 (!) 108  Resp: 19  18 18   Temp: 98.2 F (36.8 C) 98.8 F (37.1 C) 98.7 F (37.1 C) 98.5 F (36.9 C)  TempSrc:  Oral Oral   SpO2: 100% 98% 94% 96%  Weight:      Height:       Supplemental O2: Room Air SpO2: 96 % O2 Flow Rate (L/min): 5 L/min  Filed Weights   11/26/23 0514 11/27/23 0519 11/29/23 0420  Weight: 61 kg 59.4 kg 60.2 kg     Intake/Output Summary (Last 24 hours) at 12/04/2023 1409 Last data filed at 12/04/2023 0800 Gross per 24 hour  Intake 240 ml  Output 1000 ml  Net -760 ml   Net IO Since Admission: -28,182.81 mL [12/04/23 1409]  Physical Exam: Physical Exam Constitutional: well-appearing in bed, no acute distress Cardiovascular: Regular rate and rhythm, no murmurs, rubs, or gallops Pulmonary/Chest: normal work of breathing on room air, lungs clear to auscultation bilaterally Abdominal: soft, non-tender, non-distended MSK: Right BKA appreciated, staples in place and surgical site healing well   Patient Lines/Drains/Airways Status     Active Line/Drains/Airways     Name Placement date Placement time Site Days   Peripheral IV 11/29/23 20 G 1 Left;Posterior Forearm 11/29/23  1231  Forearm  5   Urethral  Catheter Mekides Nida, RN Coude 16 Fr. 11/21/23  1522  Coude  13   Wound 11/16/23 0826 Surgical  Sacrum Left 11/16/23  0826  Sacrum  18   Wound 11/17/23 1030 Surgical Closed Surgical Incision Knee Right 11/17/23  1030  Knee  17            Pertinent labs and imaging:      Latest Ref Rng & Units 12/03/2023    4:54 AM 12/01/2023    6:00 AM 11/30/2023    5:29 AM  CBC  WBC 4.0 - 10.5 K/uL 6.3  6.7  6.5   Hemoglobin 13.0 - 17.0 g/dL 7.6  7.9  7.4   Hematocrit 39.0 - 52.0 % 24.3  25.1  23.3   Platelets 150 - 400 K/uL 342  361  299        Latest Ref Rng & Units 12/03/2023    4:54 AM 12/01/2023    6:00 AM 11/30/2023    5:29 AM  CMP  Glucose 70 - 99 mg/dL 866  812  845   BUN 8 - 23 mg/dL 53  56  53   Creatinine 0.61 - 1.24 mg/dL 7.83  7.67  7.56   Sodium 135 - 145 mmol/L 132  131  130   Potassium 3.5 - 5.1 mmol/L 4.2  4.7  4.9   Chloride 98 - 111 mmol/L 96  96  96   CO2 22 -  32 mmol/L 26  26  22    Calcium  8.9 - 10.3 mg/dL 8.3  8.4  8.2     No results found.  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 male with past medical history of HFrEF, urinary retention with chronic Foley, chronic osteomyelitis of right foot who presented with concerns of lethargy found to have chronic osteo.  He is now status post  right BKA and now medically ready for discharge awaiting SNF placement. Union rep submitted disability paperwork on Saturday. We got informed it may take ~9 days to process. Will monitor for updates.    #Status post BKA of right foot #Chronic osteomyelitis of right foot, resolved Patient is postop day 17, He is doing well.  Still has staples out today, need to get removed,  If nurses unable to do so, will see if orthopedics can get staples out tomorrow.  - Dry dressing - remove Staples - PT/OT following, recommending SNF   #CAUTI Resolved. Patient had UTI during hospitalization.  Changed out catheter.  Last catheter change was 07/22/ 2025.  No acute concerns anymore.  Completed treatment with ceftriaxone . - Can follow-up outpatient for catheter management   #Concern for multiple myeloma Patient had SPEP with positive M spike as well as elevated protein gap.  Bone marrow biopsy finding concerns of multiple myeloma.  Can follow-up outpatient. - Consulted oncology who will follow-up with patient outpatient - Monitor renal function and support electrolyte status   #Hyponatremia Chronically low sodium, likely in setting of multiple myeloma with gammopathy.  No acute concerns.  Sodium today is 132. - Continue to monitor BMP    #AKI on suspected CKD #Hyperkalemia Creatinine today at 2.16.  Seems to be at baseline. Will continue to trend kidney function.  Potassium 4.2.  Continue monitoring electrolytes and creatinine. - Can follow kidney function tomorrow - DC lokelma  BID   #HFrEF Echo 10/12/2023 showed EF of 20 to 25%.  Patient seems  euvolemic on exam.  No concern for acute exacerbation.  Patient is on metoprolol  tartrate 25 mg twice daily, however we should probably transition to 50 mg daily as succinate as shown to improve mortality morbidity in HFrEF patients. - metoprolol  succinate 50 mg daily starting tomorrow - If renal function does start to improve, can potentially start SGLT2 and  spironolactone  or Entresto    #History of LV thrombus #New subscapular splenic infarct - No acute concerns at this time - Continue Eliquis  5 mg twice daily   #Type 2 diabetes mellitus - Medications at this time currently include Semglee  15 units daily, NovoLog  3 units 3 times daily with meals, as well as sliding scale.  Sugars have been measuring well. Plan continue carb modified diet - Continue current insulin  regimen - Restart metformin  1000  mg daily - Restart Farxiga  10 mg daily - Follow-up A1c outpatient when patient is due   #Social situation Tough social  situation.  Patient is still working on getting short-term disability and insurance to get approved for SNF.  Will continue to follow for progression.  He is medically ready for discharge.  Awaiting safe dispo plan.   Diet: Normal IVF: None,None VTE: DOAC Code: Full PT/OT recs: SNF for Subacute PT TOC recs: Getting paperwork completed for short term disability  Family Update:     Dispo: Anticipated discharge to Skilled nursing facility in 5 days pending placement.  Signature:  Savio Albrecht Bernadine [facility] Internal Medicine Residency  2:09 PM, 12/04/2023  On Call pager [phone]

## 2023-12-04 NOTE — TOC Progression Note (Signed)
 Transition of Care [facility]) - Progression Note    Patient Details  Name: Derek Thompson MRN: [id number] Date of Birth: 08/17/1960  [facility] CM/SW Contact  [name] [place], [name] Phone Number: 12/04/2023, 10:36 AM  Clinical Narrative:     Received email from union rep, [name], stating the following:  All the documents have now been submitted to team care. It is up to a 9-day process.  TOC will continue to follow.  Expected Discharge Plan: Skilled Nursing Facility Barriers to Discharge: Inadequate or no insurance               Expected Discharge Plan and Services In-house Referral: Clinical Social Work   Post Acute Care Choice: Skilled Nursing Facility Living arrangements for the past 2 months: Homeless Shelter                                       Social Drivers of Health (SDOH) Interventions SDOH Screenings   Food Insecurity: No Food Insecurity (11/02/2023)  Housing: High Risk (11/02/2023)  Transportation Needs: Unmet Transportation Needs (11/02/2023)  Utilities: Not At Risk (11/02/2023)  Alcohol Screen: Low Risk  (08/19/2022)  Depression (PHQ2-9): Low Risk  (10/11/2023)  Financial Resource Strain: Low Risk  (08/19/2022)  Tobacco Use: Medium Risk (11/17/2023)    Readmission Risk Interventions     No data to display

## 2023-12-04 NOTE — Progress Notes (Signed)
 Physical Therapy Treatment Patient Details Name: Derek Thompson MRN: [id number] DOB: 05/02/1961 Today's Date: 12/04/2023   History of Present Illness Pt is a 63 y/o M presenting to ED on 11/02/23 with lethargy. Pt found to be tachycardic, hyperkalemic, and hyponatremic. 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    PT Comments  Continuing work on functional mobility and activity tolerance;  Seemed to be in better spirits, and looks forward to getting to rehab; Session focused on progressive amb, and pt demonstrates much improved activity tolerance, with no notable dyspnea with standing, walking trials; walked with RW in the hallway (with chair follow for safety), and one seated rest break; I wonder if his fatigue with less upright activity was more related to orthostasis; will consider getting orthostatics next session in the hopes of ruling it out;   Plan to pull together an exercise program aimed at strengthening hip and knee extensors, and stretching knee and hip flexors to prepare for his prosthesis, and will see if Mobility Team can help with exercises as well.   If plan is discharge home, recommend the following: A lot of help with bathing/dressing/bathroom;Supervision due to cognitive status;Help with stairs or ramp for entrance;A lot of help with walking and/or transfers;Assistance with cooking/housework;Assist for transportation   Can travel by private vehicle     No  Equipment Recommendations  Rolling walker (2 wheels);BSC/3in1;Wheelchair (measurements PT);Wheelchair cushion (measurements PT)    Recommendations for Other Services       Precautions / Restrictions Precautions Precautions: Fall;Other (comment) Recall  of Precautions/Restrictions: Impaired Precaution/Restrictions Comments: R limb guard protector Restrictions RLE Weight Bearing Per Provider Order: Non weight bearing     Mobility  Bed Mobility Overal bed mobility: Needs Assistance Bed Mobility: Supine to Sit     Supine to sit: Supervision     General bed mobility comments: increased time. no physical assistance; cues to scoot closer to EOB and to square off hips    Transfers Overall transfer level: Needs assistance Equipment used: Rolling walker (2 wheels) Transfers: Sit to/from Stand Sit to Stand: Min assist           General transfer comment: stood from elevated bed with assist to rise and steady and cues for hand placement, tends to pull up on RW with 2 hands; stood from lower recliner seat, using armrests to push up; cues for hand placement to control descent to sit    Ambulation/Gait Ambulation/Gait assistance: Min assist, +2 safety/equipment (chair follow) Gait Distance (Feet): 10 Feet (x2) Assistive device: Rolling walker (2 wheels) Gait Pattern/deviations: Decreased step length - left (hop-to)       General Gait Details: Walked into the hallway, with chair follow for safety; occasionally stepping L foot even with or slightly past front wheels, puttin gpt at risk of falling backwards; slight correction with cues, anticipate will need reinforcement; one seated rest break   Stairs             Wheelchair Mobility     Tilt Bed    Modified Rankin (Stroke Patients Only)       Balance     Sitting balance-Leahy Scale: Good       Standing balance-Leahy Scale: Poor  Communication Communication Communication: No apparent difficulties  Cognition Arousal: Alert Behavior During Therapy: WFL for tasks assessed/performed, Flat affect                           PT - Cognition Comments: noteworthy improvements in initiation Following commands:  Intact      Cueing Cueing Techniques: Verbal cues  Exercises      General Comments General comments (skin integrity, edema, etc.): Noteworthy that pt showed no dyspnea with increased amb distance today      Pertinent Vitals/Pain Pain Assessment Pain Assessment: Faces Faces Pain Scale: Hurts a little bit Pain Location: generalized Pain Descriptors / Indicators: Grimacing Pain Intervention(s): Monitored during session    Home Living                          Prior Function            PT Goals (current goals can now be found in the care plan section) Acute Rehab PT Goals Patient Stated Goal: to get to rehab PT Goal Formulation: With patient Time For Goal Achievement: 12/15/23 Potential to Achieve Goals: Good Progress towards PT goals: Progressing toward goals    Frequency    Min 2X/week      PT Plan      Co-evaluation              AM-PAC PT 6 Clicks Mobility   Outcome Measure  Help needed turning from your back to your side while in a flat bed without using bedrails?: A Little Help needed moving from lying on your back to sitting on the side of a flat bed without using bedrails?: A Little Help needed moving to and from a bed to a chair (including a wheelchair)?: A Little Help needed standing up from a chair using your arms (e.g., wheelchair or bedside chair)?: A Little Help needed to walk in hospital room?: A Lot Help needed climbing 3-5 steps with a railing? : Total 6 Click Score: 15    End of Session Equipment Utilized During Treatment: Gait belt Activity Tolerance: Patient tolerated treatment well Patient left: in chair;with call bell/phone within reach;with chair alarm set (pressure redistribution cushion in seat) Nurse Communication: Mobility status PT Visit Diagnosis: Unsteadiness on feet (R26.81);Difficulty in walking, not elsewhere classified (R26.2);Other abnormalities of gait and mobility (R26.89) Pain - Right/Left: Right Pain -  part of body:  (Phantom pain/sensation)     Time: 9064-8994 PT Time Calculation (min) (ACUTE ONLY): 30 min  Charges:    $Gait Training: 23-37 mins PT General Charges $$ ACUTE PT VISIT: 1 Visit                     [name], PT  Acute Rehabilitation Services Office [phone] Secure Chat welcomed    [name] 12/04/2023, 1:03 PM

## 2023-12-04 NOTE — Progress Notes (Signed)
 25 staples removed from right stump incision with no complications. Incision clean, dry and intact.

## 2023-12-04 NOTE — Progress Notes (Incomplete)
 HD#31 SUBJECTIVE:  Patient Summary: This is a 63 year old male with past medical history of HFrEF, urinary retention with chronic Foley, chronic osteomyelitis of right foot who presented with concerns of lethargy found to have chronic osteo. He is now status post right BKA and now medically ready for discharge awaiting SNF placement.  Overnight Events:  Had anxiety early in the morning.  Interim History:  Patient reports feeling anxious and having difficulty sleeping. He requested that the blinds be opened. He is hopeful that he will sleep better tonight, as no labs are scheduled for tomorrow. He also asked for follow-up regarding his paperwork.  OBJECTIVE:  Vital Signs: Vitals:   12/03/23 0758 12/03/23 1737 12/03/23 2031 12/04/23 0509  BP: (!) 104/58 118/73 110/63 (!) 101/52  Pulse: 82 90 87 (!) 110  Resp: 19 19  18   Temp: 98.3 F (36.8 C) 98.2 F (36.8 C) 98.8 F (37.1 C) 98.7 F (37.1 C)  TempSrc:   Oral Oral  SpO2: 100% 100% 98% 94%  Weight:      Height:       Supplemental O2: Room Air SpO2: 94 % O2 Flow Rate (L/min): 5 L/min  Filed Weights   11/26/23 0514 11/27/23 0519 11/29/23 0420  Weight: 61 kg 59.4 kg 60.2 kg     Intake/Output Summary (Last 24 hours) at 12/04/2023 9378 Last data filed at 12/04/2023 0600 Gross per 24 hour  Intake 600 ml  Output 1600 ml  Net -1000 ml   Net IO Since Admission: -28,422.81 mL [12/04/23 0621]  Physical Exam: Physical Exam Constitutional: well-appearing in bed, no acute distress Cardiovascular: Regular rate and rhythm, no murmurs, rubs, or gallops Pulmonary/Chest: normal work of breathing on room air, lungs clear to auscultation bilaterally Abdominal: soft, non-tender, non-distended MSK: Right BKA appreciated, staples removed and surgical site healing well Patient Lines/Drains/Airways Status     Active Line/Drains/Airways     Name Placement date Placement time Site Days   Peripheral IV 11/29/23 20 G 1 Left;Posterior Forearm  11/29/23  1231  Forearm  5   Urethral Catheter Mekides Nida, RN Coude 16 Fr. 11/21/23  1522  Coude  13   Wound 11/16/23 0826 Surgical  Sacrum Left 11/16/23  0826  Sacrum  18   Wound 11/17/23 1030 Surgical Closed Surgical Incision Knee Right 11/17/23  1030  Knee  17            Pertinent labs and imaging:      Latest Ref Rng & Units 12/03/2023    4:54 AM 12/01/2023    6:00 AM 11/30/2023    5:29 AM  CBC  WBC 4.0 - 10.5 K/uL 6.3  6.7  6.5   Hemoglobin 13.0 - 17.0 g/dL 7.6  7.9  7.4   Hematocrit 39.0 - 52.0 % 24.3  25.1  23.3   Platelets 150 - 400 K/uL 342  361  299        Latest Ref Rng & Units 12/03/2023    4:54 AM 12/01/2023    6:00 AM 11/30/2023    5:29 AM  CMP  Glucose 70 - 99 mg/dL 866  812  845   BUN 8 - 23 mg/dL 53  56  53   Creatinine 0.61 - 1.24 mg/dL 7.83  7.67  7.56   Sodium 135 - 145 mmol/L 132  131  130   Potassium 3.5 - 5.1 mmol/L 4.2  4.7  4.9   Chloride 98 - 111 mmol/L 96  96  96  CO2 22 - 32 mmol/L 26  26  22    Calcium  8.9 - 10.3 mg/dL 8.3  8.4  8.2     No results found.  ASSESSMENT/PLAN:  Assessment: Principal Problem:   Osteomyelitis of right foot (HCC) Active Problems:   CAD (coronary artery disease)   Hyperkalemia   AKI (acute kidney injury) (HCC)   Anemia   LV (left ventricular) mural thrombus without MI (HCC)   Gangrene of right foot (HCC)   Hyponatremia   HFrEF (heart failure with reduced ejection fraction) (HCC)   Chronic osteomyelitis involving lower leg, right (HCC)   Frailty   Urinary retention   Generalized weakness   Sinus tachycardia   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 (HCC)   Chronic anemia   Hypoalbuminemia   Monoclonal gammopathy   Hx of right BKA (HCC)   Moderate protein-calorie malnutrition (HCC)   Plan:  Derek Thompson is a 63 y.o. male with past medical history of HFrEF, urinary retention with chronic Foley, chronic osteomyelitis of right foot who presented with concerns of lethargy found to  have chronic osteo.  He is now status post right BKA and now medically ready for discharge awaiting SNF placement.  #Fatigue and Anxiety Patient appeared alert and pleasant during the visit but expressed feeling tired from being in the hospital and not sleeping well at night due to frequent staff interruptions. No labs are scheduled tomorrow due to the lab holiday, which may allow for better rest. Plan to start melatonin to help with sleep.   #Status post BKA of right foot #Chronic osteomyelitis of right foot, resolved Patient is postop day 11.  He is doing well.  Will try to get staples out today.  If nurses unable to do so, will see if orthopedics can get staples out tomorrow.  - Dry dressing - staples removed - PT/OT following, recommending SNF   #CAUTI Resolved. Patient had UTI during hospitalization.  Changed out catheter.  Last catheter change was 07/22/ 2025.  No acute concerns anymore.  Completed treatment with ceftriaxone . - Can follow-up outpatient for catheter management   #Concern for multiple myeloma Patient had SPEP with positive M spike as well as elevated protein gap.  Bone marrow biopsy finding concerns of multiple myeloma.  Can follow-up outpatient. - Consulted oncology who will follow-up with patient outpatient - Monitor renal function and support electrolyte status   #Hyponatremia Chronically low sodium, likely in setting of multiple myeloma with gammopathy.  No acute concerns.  Sodium today is 131. - Continue to monitor BMP -Lab holiday tomorrow   #AKI on suspected CKD #Hyperkalemia Creatinine today at 2.56.  Seems to be at baseline.  I do wonder if he has a component of CKD.  Will continue to trend kidney function.  Potassium 4.9.  Continue monitoring electrolytes and creatinine. - Can follow kidney function tomorrow - Patient does have hyperkalemia at times, on Lokelma    #HFrEF Echo 10/12/2023 showed EF of 20 to 25%.  Patient seems  euvolemic on exam.  No  concern for acute exacerbation.  Patient is on metoprolol  tartrate 25 mg twice daily, however we should probably transition to 50 mg daily as succinate as shown to improve mortality morbidity in HFrEF patients. - Transition to metoprolol  succinate 50 mg daily starting tomorrow - If renal function does start to improve, can potentially start SGLT2 and spironolactone  or Entresto    #History of LV thrombus #New subscapular splenic infarct - No acute concerns at this  time - Continue Eliquis  5 mg twice daily   #Type 2 diabetes mellitus - Medications at this time currently include Semglee  15 units daily, NovoLog  3 units 3 times daily with meals, as well as sliding scale.  Sugars have been measuring well. Plan continue carb modified diet - Continue current insulin  regimen - Follow-up A1c outpatient when patient is due   #Social situation Tough social  situation.  Patient is still working on getting short-term disability and insurance to get approved for SNF.  Will continue to follow for progression.  He is medically ready for discharge.  Awaiting safe dispo plan.   Diet: Normal IVF: None,None VTE: DOAC Code: Full PT/OT recs: SNF for Subacute PT TOC recs: Getting paperwork completed for short term disability  Family Update:     Dispo: Anticipated discharge to Skilled nursing facility in 5 days pending placement.  Signature:  Carlie Corpus [facility]  6:21 AM, 12/04/2023  On Call pager [phone]

## 2023-12-05 DIAGNOSIS — E1169 Type 2 diabetes mellitus with other specified complication: Secondary | ICD-10-CM | POA: Diagnosis not present

## 2023-12-05 DIAGNOSIS — M86171 Other acute osteomyelitis, right ankle and foot: Secondary | ICD-10-CM | POA: Diagnosis not present

## 2023-12-05 DIAGNOSIS — E1151 Type 2 diabetes mellitus with diabetic peripheral angiopathy without gangrene: Secondary | ICD-10-CM | POA: Diagnosis not present

## 2023-12-05 DIAGNOSIS — R000 Tachycardia, unspecified: Secondary | ICD-10-CM | POA: Diagnosis not present

## 2023-12-05 LAB — CBC
HCT: 23.4 % — ABNORMAL LOW (ref 39.0–52.0)
Hemoglobin: 7.5 g/dL — ABNORMAL LOW (ref 13.0–17.0)
MCH: 27.7 pg (ref 26.0–34.0)
MCHC: 32.1 g/dL (ref 30.0–36.0)
MCV: 86.3 fL (ref 80.0–100.0)
Platelets: 361 K/uL (ref 150–400)
RBC: 2.71 MIL/uL — ABNORMAL LOW (ref 4.22–5.81)
RDW: 17.9 % — ABNORMAL HIGH (ref 11.5–15.5)
WBC: 8.6 K/uL (ref 4.0–10.5)
nRBC: 0 % (ref 0.0–0.2)

## 2023-12-05 LAB — GLUCOSE, CAPILLARY
Glucose-Capillary: 90 mg/dL (ref 70–99)
Glucose-Capillary: 132 mg/dL — ABNORMAL HIGH (ref 70–99)
Glucose-Capillary: 96 mg/dL (ref 70–99)
Glucose-Capillary: 106 mg/dL — ABNORMAL HIGH (ref 70–99)

## 2023-12-05 LAB — BASIC METABOLIC PANEL WITH GFR
Anion gap: 9 (ref 5–15)
BUN: 60 mg/dL — ABNORMAL HIGH (ref 8–23)
CO2: 26 mmol/L (ref 22–32)
Calcium: 8.5 mg/dL — ABNORMAL LOW (ref 8.9–10.3)
Chloride: 96 mmol/L — ABNORMAL LOW (ref 98–111)
Creatinine, Ser: 2.56 mg/dL — ABNORMAL HIGH (ref 0.61–1.24)
GFR, Estimated: 27 mL/min — ABNORMAL LOW (ref >60)
Glucose, Bld: 104 mg/dL — ABNORMAL HIGH (ref 70–99)
Potassium: 4.9 mmol/L (ref 3.5–5.1)
Sodium: 131 mmol/L — ABNORMAL LOW (ref 135–145)

## 2023-12-05 MED ORDER — MELATONIN 3 MG PO TABS
3.0000 mg | ORAL_TABLET | Freq: Every day | ORAL | Status: DC
  Administered 2023-12-05 – 2023-12-28 (×27): 3 mg via ORAL
  Filled 2023-12-05 (×24): qty 1

## 2023-12-05 NOTE — Progress Notes (Signed)
 Physical Therapy Treatment Patient Details Name: Derek Thompson MRN: [id number] DOB: 03/13/1961 Today's Date: 12/05/2023   History of Present Illness Pt is a 63 y/o M presenting to ED on 11/02/23 with lethargy. Pt found to be tachycardic, hyperkalemic, and hyponatremic. 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    PT Comments  Pt supine in bed on arrival this session.  He appears in low spirits and reports motivation can be hard to find.  He responded well to PT session and appears motivated during session despite his reports.  Pt continues to benefit from aggressive rehab in a post acute session.      If plan is discharge home, recommend the following: A lot of help with bathing/dressing/bathroom;Supervision due to cognitive status;Help with stairs or ramp for entrance;A lot of help with walking and/or transfers;Assistance with cooking/housework;Assist for transportation   Can travel by private vehicle     No  Equipment Recommendations  Rolling walker (2 wheels);BSC/3in1;Wheelchair (measurements PT);Wheelchair cushion (measurements PT)    Recommendations for Other Services       Precautions / Restrictions Precautions Precautions: Fall;Other (comment) Recall of Precautions/Restrictions: Impaired Precaution/Restrictions Comments: R BKA- no pillows under knee Required Braces or Orthoses: Other Brace Other Brace: R limb guard protector Restrictions Weight Bearing Restrictions Per Provider Order: Yes RLE Weight Bearing Per Provider Order: Non weight bearing Other Position/Activity Restrictions: Educated in wearing limb guard when OOB and during sleeping to encourage R knee extension.     Mobility  Bed Mobility Overal  bed mobility: Needs Assistance Bed Mobility: Supine to Sit, Sit to Supine     Supine to sit: Modified independent (Device/Increase time) Sit to supine: Modified independent (Device/Increase time)        Transfers Overall transfer level: Needs assistance Equipment used: Rolling walker (2 wheels) Transfers: Sit to/from Stand Sit to Stand: Min assist           General transfer comment: Cues for hand placement and scooting to edge of bed pre transfer.  Total assistance to donn RLE limb protector.    Ambulation/Gait Ambulation/Gait assistance: Min assist, Mod assist Gait Distance (Feet): 20 Feet Assistive device: Rolling walker (2 wheels) Gait Pattern/deviations: Decreased step length - left, Trunk flexed (hop to) Gait velocity: decreased Gait velocity interpretation: <1.8 ft/sec, indicate of risk for recurrent falls   General Gait Details: Pt performed 10 ft with smooth gt quality and good posture.  Increased assistance needed for turns and backing.  Pt fatigues quickly,  attempted to rest on B elbows of RW hand grips and required cues to maintain stance and rest in standing before continuing.   Stairs             Wheelchair Mobility     Tilt Bed    Modified Rankin (Stroke Patients Only)       Balance Overall balance assessment: Needs assistance Sitting-balance support: No upper extremity supported, Feet supported Sitting balance-Leahy Scale: Good       Standing balance-Leahy Scale: Poor Standing balance comment: heavy reliance on rolling walker. increased assistance for dynamic activity                            Communication Communication Communication: No apparent difficulties  Cognition Arousal: Alert Behavior During Therapy: WFL for tasks assessed/performed, Flat affect   PT - Cognitive impairments: Initiation, Sequencing                       PT - Cognition Comments: noteworthy improvements in initiation Following  commands: Intact Following commands impaired: Follows one step commands with increased time    Cueing Cueing Techniques: Verbal cues  Exercises Amputee Exercises Quad Sets: AROM, Right, 10 reps, Supine Hip Extension: AROM, Right, 10 reps, Sidelying Hip ABduction/ADduction: AROM, Right, 10 reps, Sidelying Straight Leg Raises: AROM, Right, 10 reps, Supine    General Comments        Pertinent Vitals/Pain Pain Assessment Pain Assessment: 0-10 Faces Pain Scale: Hurts even more Pain Location: generalized R hip and knee with stretching Pain Descriptors / Indicators: Grimacing Pain Intervention(s): Monitored during session, Repositioned (put R LE in supported knee extension)    Home Living                          Prior Function            PT Goals (current goals can now be found in the care plan section) Acute Rehab PT Goals Patient Stated Goal: to get to rehab Potential to Achieve Goals: Good Additional Goals Additional Goal #1: Pt will propel w/c with BUE's x 150 ft with supervision Progress towards PT goals: Progressing toward goals    Frequency    Min 2X/week      PT Plan      Co-evaluation              AM-PAC PT 6 Clicks Mobility   Outcome Measure  Help needed turning from your back to your side while in a flat bed without using bedrails?: A Little Help needed moving from lying on your back to sitting on the side of a flat bed without using bedrails?: A Little Help needed moving to and from a bed to a chair (including a wheelchair)?: A Little Help needed standing up from a chair using your arms (e.g., wheelchair or bedside chair)?: A Little Help needed to walk in hospital room?: A Lot Help needed climbing 3-5 steps with a railing? : Total 6 Click Score: 15    End of Session Equipment Utilized During Treatment: Gait belt Activity Tolerance: Patient tolerated treatment well Patient left: in bed;with call bell/phone within reach;with bed  alarm set Nurse Communication: Mobility status PT Visit Diagnosis: Unsteadiness on feet (R26.81);Difficulty in walking, not elsewhere classified (R26.2);Other abnormalities of gait and mobility (R26.89) Pain - Right/Left: Right Pain - part of body:  (R phantom pain)     Time: 8486-8453 PT Time Calculation (min) (ACUTE ONLY): 33 min  Charges:    $Gait Training: 8-22 mins $Therapeutic Exercise: 8-22 mins PT General Charges $$ ACUTE PT VISIT: 1 Visit                     Toya HAMS , PTA Acute Rehabilitation Services Office [phone]    Toya JINNY Gosling 12/05/2023, 3:59 PM

## 2023-12-05 NOTE — Inpatient Diabetes Management (Addendum)
 Inpatient Diabetes Program Recommendations  AACE/ADA: New Consensus Statement on Inpatient Glycemic Control (2015)  Target Ranges:  Prepandial:   less than 140 mg/dL      Peak postprandial:   less than 180 mg/dL (1-2 hours)      Critically ill patients:  140 - 180 mg/dL   Lab Results  Component Value Date   GLUCAP 90 12/05/2023   HGBA1C 8.7 (H) 09/02/2023    Review of Glycemic Control  Latest Reference Range & Units 12/04/23 07:46 12/04/23 11:12 12/04/23 17:01 12/04/23 17:51 12/04/23 22:07 12/05/23 07:35  Glucose-Capillary 70 - 99 mg/dL 82 857 (H) 58 (L) 97 860 (H) 90   Diabetes history: DM 2 Outpatient Diabetes medications: Farxiga  10 mg Daily, metformin  1000 mg Daily Current orders for Inpatient glycemic control:  Farxiga  10 mg Daily Novolog  0-9 units tid + hs Semglee  15 units Daily Novolog  3 units tid meal coverage  Boost Breeze (54 grams of carbohydrate) tid between meals  Inpatient Diabetes Program Recommendations:    -  pt sensitive to insulin  due to renal function yet also has a high carb supplement. May want to reduce correction scale to very sensitive (0-6 units tid) and.... NURSING staff make sure pt consumes at least 50% of meals/supplements before giving meal coverage.   Thanks, Clotilda Bull RN, MSN, BC-ADM Inpatient Diabetes Coordinator Team Pager [phone] (8a-5p)

## 2023-12-05 NOTE — Progress Notes (Signed)
 Mobility Specialist Progress Note:    12/05/23 1152  Mobility  Activity  (dangled EOB)  Level of Assistance Minimal assist, patient does 75% or more  Assistive Device Other (Comment) (HHA)  RLE Weight Bearing Per Provider Order NWB  Activity Response Tolerated fair  Mobility Referral Yes  Mobility visit 1 Mobility  Mobility Specialist Start Time (ACUTE ONLY) [id number]  Mobility Specialist Stop Time (ACUTE ONLY) 0909  Mobility Specialist Time Calculation (min) (ACUTE ONLY) 17 min   Pt received in bed hesitant but agreeable to mobility. MS assisted in putting on RLE limb protector. Pt required MinA to get to EOB. Was able to sit EOB for ~3 mins. Attempted to stand at bedside pt unable to, states he is too weak. Pt impulsively lays back down unable to continue. Call bell and personal belongings in reach. All needs met.  Thersia Minder Mobility Specialist  Please contact vis Secure Chat or  Rehab Office [phone]

## 2023-12-06 LAB — GLUCOSE, CAPILLARY
Glucose-Capillary: 137 mg/dL — ABNORMAL HIGH (ref 70–99)
Glucose-Capillary: 121 mg/dL — ABNORMAL HIGH (ref 70–99)
Glucose-Capillary: 146 mg/dL — ABNORMAL HIGH (ref 70–99)
Glucose-Capillary: 151 mg/dL — ABNORMAL HIGH (ref 70–99)
Glucose-Capillary: 94 mg/dL (ref 70–99)

## 2023-12-06 NOTE — TOC Progression Note (Signed)
 Transition of Care [facility]) - Progression Note    Patient Details  Name: Derek Thompson MRN: [id number] Date of Birth: 04/10/1961  [facility]) CM/SW Contact  [name] [place], [name] Phone Number: 12/06/2023, 11:48 AM  Clinical Narrative:     The following was documented by financial navigator:  Received call back from [employer] case worker. She stated the Medicaid case for Mr. Jorgensen was opened for 90 days, she had numerous conversation with the patients mom (POA) and even with the patients brother and requested pay stubs, proof of income and a copy of tax value for the patients property, and nothing was provided.  Unclear if pt can still submit those records or if a new application would need to be submitted. CSW requested clarification from financial navigator.   Pt's short term disability through his work benefits is still pending.   Expected Discharge Plan: Skilled Nursing Facility Barriers to Discharge: Inadequate or no insurance               Expected Discharge Plan and Services In-house Referral: Clinical Social Work   Post Acute Care Choice: Skilled Nursing Facility Living arrangements for the past 2 months: Homeless Shelter                                       Social Drivers of Health (SDOH) Interventions SDOH Screenings   Food Insecurity: No Food Insecurity (11/02/2023)  Housing: High Risk (11/02/2023)  Transportation Needs: Unmet Transportation Needs (11/02/2023)  Utilities: Not At Risk (11/02/2023)  Alcohol Screen: Low Risk  (08/19/2022)  Depression (PHQ2-9): Low Risk  (10/11/2023)  Financial Resource Strain: Low Risk  (08/19/2022)  Tobacco Use: Medium Risk (11/17/2023)    Readmission Risk Interventions     No data to display

## 2023-12-06 NOTE — Progress Notes (Signed)
 HD#[id number] SUBJECTIVE:  Patient Summary: This is a 63 year old male with past medical history of HFrEF, urinary retention with chronic Foley, chronic osteomyelitis of right foot who presented with concerns of lethargy found to have chronic osteo. He is now status post right BKA and now medically ready for discharge awaiting SNF placement.  Overnight Events: N/A  Interim History:  The patient reports no pain or redness at the IV site on his left arm, no dysuria, no abdominal pain. The wound on his right leg is healing nicely without issues. He slept better last night. He states that his physical therapy is going well and plans to increase the intensity of sessions. The patient is having regular bowel movements. He will remain hospitalized for another week, and a ground physical therapy order will be placed accordingly.  OBJECTIVE:  Vital Signs: Vitals:   12/06/23 0427 12/06/23 0443 12/06/23 0556 12/06/23 0804  BP: (!) 85/53 101/66  103/64  Pulse: (!) 113 (!) 114  95  Resp: 17 19  18   Temp: (!) 100.8 F (38.2 C) (!) 100.8 F (38.2 C)  97.9 F (36.6 C)  TempSrc: Oral Oral  Oral  SpO2: 98% 92%  100%  Weight:   59.7 kg   Height:       Supplemental O2: Room Air SpO2: 100 % O2 Flow Rate (L/min): 5 L/min  Filed Weights   11/27/23 0519 11/29/23 0420 12/06/23 0556  Weight: 59.4 kg 60.2 kg 59.7 kg     Intake/Output Summary (Last 24 hours) at 12/06/2023 1444 Last data filed at 12/06/2023 1100 Gross per 24 hour  Intake 260 ml  Output 2200 ml  Net -1940 ml   Net IO Since Admission: -31,022.81 mL [12/06/23 1444]  Physical Exam: Physical Exam Constitutional: well-appearing in bed, no acute distress Cardiovascular: Regular rate and rhythm, no murmurs, rubs, or gallops Pulmonary/Chest: normal work of breathing on room air, lungs clear to auscultation bilaterally Abdominal: soft, non-tender, non-distended MSK: Right BKA appreciated, staples removed and surgical site healing well Patient  Lines/Drains/Airways Status     Active Line/Drains/Airways     Name Placement date Placement time Site Days   Peripheral IV 11/29/23 20 G 1 Left;Posterior Forearm 11/29/23  1231  Forearm  7   Urethral Catheter Mekides Nida, RN Coude 16 Fr. 11/21/23  1522  Coude  15   Wound 11/16/23 0826 Surgical  Sacrum Left 11/16/23  0826  Sacrum  20   Wound 11/17/23 1030 Surgical Closed Surgical Incision Knee Right 11/17/23  1030  Knee  19            Pertinent labs and imaging:      Latest Ref Rng & Units 12/05/2023    5:29 AM 12/03/2023    4:54 AM 12/01/2023    6:00 AM  CBC  WBC 4.0 - 10.5 K/uL 8.6  6.3  6.7   Hemoglobin 13.0 - 17.0 g/dL 7.5  7.6  7.9   Hematocrit 39.0 - 52.0 % 23.4  24.3  25.1   Platelets 150 - 400 K/uL 361  342  361        Latest Ref Rng & Units 12/05/2023    5:29 AM 12/03/2023    4:54 AM 12/01/2023    6:00 AM  CMP  Glucose 70 - 99 mg/dL 895  866  812   BUN 8 - 23 mg/dL 60  53  56   Creatinine 0.61 - 1.24 mg/dL 7.43  7.83  7.67   Sodium 135 - 145  mmol/L 131  132  131   Potassium 3.5 - 5.1 mmol/L 4.9  4.2  4.7   Chloride 98 - 111 mmol/L 96  96  96   CO2 22 - 32 mmol/L 26  26  26    Calcium  8.9 - 10.3 mg/dL 8.5  8.3  8.4     No results found.  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  male with past medical history of HFrEF, urinary retention with chronic Foley, chronic osteomyelitis of right foot who presented with concerns of lethargy found to have chronic osteo.  He is now status post right BKA and now medically ready for discharge awaiting SNF placement.  #Fever One episode of fever at 100.45F noted. No dysuria, diarrhea or abdominal pain. No cold or flu symptoms reported. IV site is clean, without redness or swelling. Surgical site appears normal, with no discharge, redness, or swelling. No signs of deep vein thrombosis observed. - urine culture and blood cultures #Fatigue and Anxiety Patient appeared alert and pleasant during the visit but expressed feeling tired from being in the hospital and not sleeping well at night due to frequent staff interruptions. No labs are scheduled tomorrow due to the lab holiday, which may allow for better rest. Started melatonin to help with sleep. #Status post BKA of right foot #Chronic osteomyelitis of right foot, resolved Patient is postop day 11.  He is doing well.  Will try to get staples out today.  If nurses unable to do so, will see if orthopedics can get staples out tomorrow.  - Dry dressing - staples removed - PT/OT following, recommending SNF #CAUTI Resolved. Patient had UTI during hospitalization.  Changed out catheter.  Last catheter change was 07/22/ 2025.  No acute concerns anymore.  Completed treatment with ceftriaxone . - Can follow-up outpatient for catheter management #Concern for multiple myeloma Patient had SPEP with positive M spike as well as elevated protein gap.  Bone marrow biopsy finding concerns of multiple myeloma.  Can follow-up outpatient. 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  No acute concerns.   - Continue to monitor BMP - Lab  holiday tomorrow #AKI on suspected CKD #Hyperkalemia Last creatinine at 2.56.  Seems to be at baseline.  I do wonder if he has a component of CKD.  Will continue to trend kidney function. last Potassium 4.9.  Continue monitoring electrolytes and creatinine. - Can follow kidney function tomorrow #HFrEF Echo 10/12/2023 showed EF of 20 to 25%.  Patient seems  euvolemic on exam.  No concern for acute exacerbation.  Patient is on metoprolol  tartrate 25 mg twice daily, however we should probably transition  to 50 mg daily as succinate as shown to improve mortality morbidity in HFrEF patients. - Transition to metoprolol  succinate 50 mg daily starting tomorrow - If renal function does start to improve, can potentially start SGLT2 and spironolactone  or Entresto  #History of LV thrombus #New subscapular splenic infarct - No acute concerns at this time - Continue Eliquis  5 mg twice daily #Type 2 diabetes mellitus - Medications at this time currently include Semglee  15 units daily, NovoLog  3 units 3 times daily with meals, as well as sliding scale.  Sugars have been measuring well. Plan continue carb modified diet - Continue current insulin  regimen - Follow-up A1c outpatient when patient is due #Social situation Tough social situation.  Patient is still working on getting short-term disability and insurance to get approved for SNF.  Will continue to follow for progression.  He is medically ready for discharge.  Awaiting safe dispo plan.   Diet: Normal IVF: None,None VTE: DOAC Code: Full PT/OT recs: SNF for Subacute PT TOC recs: Getting paperwork completed for short term disability  Family Update:     Dispo: Anticipated discharge to Skilled nursing facility in 5 days pending placement. Signature:  Halena Mohar [facility]  2:44 PM, 12/06/2023  On Call pager [phone]

## 2023-12-06 NOTE — Progress Notes (Signed)
 Physical Therapy Treatment Patient Details Name: Derek Thompson MRN: [id number] DOB: 26-Jan-1961 Today's Date: 12/06/2023   History of Present Illness Pt is a 63 y/o M presenting to ED on 11/02/23 with lethargy. Pt found to be tachycardic, hyperkalemic, and hyponatremic. 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    PT Comments  Continuing work on functional mobility and activity tolerance;  Pt reports upset that paperwork is holding up his ability to get to rehab; Needed multiple attemtps for session and lots of encouragement to partiicpate today; Emphasiszed to him that the work we are doing here with PT is aimed at preparing for his stated goal: to get a prosthesis and walk again; Ultimately, gave him the choice of exercise or walking or both, but must do at least one of these activities, and pt chose to walk; He performed 2 bouts of amb, 6-7 ft each; tremulous in standing, and we took BPs in sitting and standing; BPs were soft, but no appreciable change for m sitting to standing; See vitals flow sheet.  Venetia, Mobility Specialist, joined me for session; Would like to get a consistent exercise program going for Derek Thompson aimed at hip and knee flexor stretching and knee and hip extensor strengthening in prep for prosthesis; Also there's a benefit for UE and LE general strengthening exercises for general conditioning; Mobility Team can assist with exercise programs in addition to PT/OT interventions;   Derek Thompson has also said he does not like to stay in the recliner in his room; recommend continuing education re: the need to be OOB and the detrimental effects of bedrest, and consider switching out recliner with perhaps a tan one   If plan is  discharge home, recommend the following: A lot of help with bathing/dressing/bathroom;Supervision due to cognitive status;Help with stairs or ramp for entrance;A lot of help with walking and/or transfers;Assistance with cooking/housework;Assist for transportation   Can travel by private vehicle     No  Equipment Recommendations  Rolling walker (2 wheels);BSC/3in1;Wheelchair (measurements PT);Wheelchair cushion (measurements PT)    Recommendations for Other Services       Precautions / Restrictions Precautions Precautions: Fall;Other (comment) Recall of Precautions/Restrictions: Impaired Precaution/Restrictions Comments: R BKA- no pillows under knee Required Braces or Orthoses: Other Brace Other Brace: R limb guard protector Restrictions RLE Weight Bearing Per Provider Order: Non weight bearing Other Position/Activity Restrictions: Educated in wearing limb guard when OOB and during sleeping to encourage R knee extension.     Mobility  Bed Mobility Overal bed mobility: Needs Assistance Bed Mobility: Supine to Sit, Sit to Supine     Supine to sit: Modified independent (Device/Increase time) Sit to supine: Modified independent (Device/Increase time)   General bed mobility comments: Lots of cues to initiate    Transfers Overall transfer level: Needs assistance Equipment used: Rolling walker (2 wheels) Transfers: Sit to/from Stand Sit to Stand: Mod assist, Min assist     Squat pivot transfers: Mod assist     General transfer comment: Solid Mod assist to stand from EOB; min assist to stand from recliner; once back in room, pt needed light mod assist for recliner to bed transfer    Ambulation/Gait Ambulation/Gait assistance: Min assist, Mod assist Gait Distance (Feet): 15 Feet (with one seted rest break) Assistive  device: Rolling walker (2 wheels) Gait Pattern/deviations: Decreased step length - left, Trunk flexed (hop to)       General Gait Details: More tired today  and performed 2 bouts of gait, 5-7 feet each; noting tremulous in standing, pt attributes to being cold; Bil shoulders aching post amb   Stairs             Wheelchair Mobility     Tilt Bed    Modified Rankin (Stroke Patients Only)       Balance     Sitting balance-Leahy Scale: Good Sitting balance - Comments: CGA for safety     Standing balance-Leahy Scale: Poor Standing balance comment: heavy reliance on rolling walker. increased assistance for dynamic activity                            Communication Communication Communication: No apparent difficulties Factors Affecting Communication: Difficulty expressing self  Cognition Arousal: Alert Behavior During Therapy: Flat affect   PT - Cognitive impairments: Initiation, Sequencing                         Following commands: Intact Following commands impaired: Follows one step commands with increased time    Cueing Cueing Techniques: Verbal cues  Exercises      General Comments General comments (skin integrity, edema, etc.): Tremulous in standing; took BPs      Pertinent Vitals/Pain Pain Assessment Pain Assessment: Faces Faces Pain Scale: Hurts little more Pain Location: Bil shoulder aches Pain Descriptors / Indicators: Grimacing Pain Intervention(s): Monitored during session, Premedicated before session    Home Living                          Prior Function            PT Goals (current goals can now be found in the care plan section) Acute Rehab PT Goals Patient Stated Goal: to get to rehab PT Goal Formulation: With patient Time For Goal Achievement: 12/15/23 Potential to Achieve Goals: Good Progress towards PT goals: Progressing toward goals (slowly)    Frequency    Min 2X/week      PT Plan      Co-evaluation              AM-PAC PT 6 Clicks Mobility   Outcome Measure  Help needed turning from your back to your side while in a flat bed  without using bedrails?: A Little Help needed moving from lying on your back to sitting on the side of a flat bed without using bedrails?: A Little Help needed moving to and from a bed to a chair (including a wheelchair)?: A Little Help needed standing up from a chair using your arms (e.g., wheelchair or bedside chair)?: A Little Help needed to walk in hospital room?: A Lot Help needed climbing 3-5 steps with a railing? : Total 6 Click Score: 15    End of Session Equipment Utilized During Treatment: Gait belt Activity Tolerance: Patient tolerated treatment well Patient left: in bed;with call bell/phone within reach;with bed alarm set Nurse Communication: Mobility status PT Visit Diagnosis: Unsteadiness on feet (R26.81);Difficulty in walking, not elsewhere classified (R26.2);Other abnormalities of gait and mobility (R26.89) Pain - Right/Left: Right Pain - part of body: Ankle and joints of foot     Time: 1203-1233 PT Time Calculation (min) (ACUTE ONLY): 30 min  Charges:    $  Gait Training: 23-37 mins PT General Charges $$ ACUTE PT VISIT: 1 Visit                     [name], PT  Acute Rehabilitation Services Office [phone] Secure Chat welcomed    [name] 12/06/2023, 1:58 PM

## 2023-12-06 NOTE — Progress Notes (Signed)
 Occupational Therapy Treatment Patient Details Name: Derek Thompson MRN: [id number] DOB: August 23, 1960 Today's Date: 12/06/2023   History of present illness Pt is a 63 y/o M presenting to ED on 11/02/23 with lethargy. Pt found to be tachycardic, hyperkalemic, and hyponatremic. 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   OT comments  Attempted to elicit pt establishing his own new goals toward ADL independence. Needed maximum verbal cues, eventually agreed to avoid bed pan use with all staff and work toward walking to bathroom for toileting with BSC over toilet and standing at sink for grooming. Pt came to EOB mod I after donning his sock at bed level with set up. Completed 2 grooming activities and changed gown with set up at EOB. Pt declined work on sit to stand or OOB to chair. Educated pt that he must demonstrate progress when in rehab at SNF to continue to stay for rehab. Pt stated, I understand. Patient will benefit from continued inpatient follow up therapy, <3 hours/day.      If plan is discharge home, recommend the following:  A little help with walking and/or transfers;A little help with bathing/dressing/bathroom;Assistance with cooking/housework;Direct supervision/assist for medications management;Direct supervision/assist for financial management;Assist for transportation;Help with stairs or ramp for entrance   Equipment Recommendations  BSC/3in1    Recommendations for Other Services      Precautions / Restrictions Precautions Precautions: Fall;Other (comment) Required Braces or Orthoses: Other Brace Other Brace: R limb guard protector Restrictions Weight Bearing Restrictions Per Provider Order: Yes RLE Weight  Bearing Per Provider Order: Non weight bearing       Mobility Bed Mobility Overal bed mobility: Modified Independent             General bed mobility comments: no assist    Transfers                   General transfer comment: declined OOB to chair, agreed to stop using bed pan and only use BSC, has a foley     Balance Overall balance assessment: Needs assistance   Sitting balance-Leahy Scale: Good                                     ADL either performed or assessed with clinical judgement   ADL Overall ADL's : Needs assistance/impaired     Grooming: Wash/dry hands;Wash/dry face;Oral care;Sitting;Set up Grooming Details (indicate cue type and reason): decreased thoroughness         Upper Body Dressing : Set up;Sitting   Lower Body Dressing: Set up;Bed level Lower Body Dressing Details (indicate cue type and reason): donned sock                    Extremity/Trunk Assessment              Vision       Perception     Praxis     Communication Communication Communication: No apparent difficulties   Cognition Arousal: Alert Behavior During Therapy: Flat affect Cognition: Cognition impaired     Awareness: Intellectual awareness intact, Online awareness impaired Memory impairment (select all impairments): Short-term memory   Executive functioning impairment (select all impairments):  Problem solving, Reasoning OT - Cognition Comments: poor insight in importance of participating in OOB and ADLs to progress to independence                 Following commands: Intact Following commands impaired: Follows one step commands with increased time      Cueing   Cueing Techniques: Verbal cues  Exercises      Shoulder Instructions       General Comments      Pertinent Vitals/ Pain       Pain Assessment Pain Assessment: Faces Faces Pain Scale: No hurt  Home Living                                           Prior Functioning/Environment              Frequency  Min 2X/week        Progress Toward Goals  OT Goals(current goals can now be found in the care plan section)  Progress towards OT goals: Not progressing toward goals - comment  Acute Rehab OT Goals OT Goal Formulation: With patient Time For Goal Achievement: 12/18/23 Potential to Achieve Goals: Fair ADL Goals Pt Will Perform Grooming: with supervision;standing Pt Will Perform Lower Body Bathing: with set-up;sitting/lateral leans Pt Will Perform Lower Body Dressing: with set-up;sitting/lateral leans Pt Will Transfer to Toilet: with supervision;ambulating;bedside commode Pt Will Perform Toileting - Clothing Manipulation and hygiene: with set-up;sitting/lateral leans  Plan      Co-evaluation                 AM-PAC OT 6 Clicks Daily Activity     Outcome Measure   Help from another person eating meals?: None Help from another person taking care of personal grooming?: A Little Help from another person toileting, which includes using toliet, bedpan, or urinal?: A Little Help from another person bathing (including washing, rinsing, drying)?: A Little Help from another person to put on and taking off regular upper body clothing?: A Little Help from another person to put on and taking off regular lower body clothing?: A Little 6 Click Score: 19    End of Session    OT Visit Diagnosis: Unsteadiness on feet (R26.81);Other abnormalities of gait and mobility (R26.89);Muscle weakness (generalized) (M62.81);Other symptoms and signs involving cognitive function   Activity Tolerance Patient tolerated treatment well;Other (comment) (self limiting)   Patient Left in bed;with call bell/phone within reach;with chair alarm set   Nurse Communication Other (comment) (NT to continue encouraging use of [facility])        Time: 9152-9091 OT Time Calculation (min): 21 min  Charges: OT General Charges $OT Visit: 1  Visit OT Treatments $Self Care/Home Management : 8-22 mins  [name], OTR/L Acute Rehabilitation Services Office: [phone]   [name] 12/06/2023, 9:09 AM

## 2023-12-07 LAB — GLUCOSE, CAPILLARY
Glucose-Capillary: 137 mg/dL — ABNORMAL HIGH (ref 70–99)
Glucose-Capillary: 77 mg/dL (ref 70–99)
Glucose-Capillary: 136 mg/dL — ABNORMAL HIGH (ref 70–99)
Glucose-Capillary: 135 mg/dL — ABNORMAL HIGH (ref 70–99)

## 2023-12-07 LAB — HEMOGLOBIN AND HEMATOCRIT, BLOOD
HCT: 28.6 % — ABNORMAL LOW (ref 39.0–52.0)
Hemoglobin: 8.3 g/dL — ABNORMAL LOW (ref 13.0–17.0)

## 2023-12-07 LAB — BASIC METABOLIC PANEL WITH GFR
Anion gap: 10 (ref 5–15)
BUN: 71 mg/dL — ABNORMAL HIGH (ref 8–23)
CO2: 27 mmol/L (ref 22–32)
Calcium: 8.7 mg/dL — ABNORMAL LOW (ref 8.9–10.3)
Chloride: 95 mmol/L — ABNORMAL LOW (ref 98–111)
Creatinine, Ser: 3.13 mg/dL — ABNORMAL HIGH (ref 0.61–1.24)
GFR, Estimated: 21 mL/min — ABNORMAL LOW (ref >60)
Glucose, Bld: 90 mg/dL (ref 70–99)
Potassium: 5 mmol/L (ref 3.5–5.1)
Sodium: 132 mmol/L — ABNORMAL LOW (ref 135–145)

## 2023-12-07 LAB — CBC
HCT: 22 % — ABNORMAL LOW (ref 39.0–52.0)
Hemoglobin: 7 g/dL — ABNORMAL LOW (ref 13.0–17.0)
MCH: 27.5 pg (ref 26.0–34.0)
MCHC: 31.8 g/dL (ref 30.0–36.0)
MCV: 86.3 fL (ref 80.0–100.0)
Platelets: 376 K/uL (ref 150–400)
RBC: 2.55 MIL/uL — ABNORMAL LOW (ref 4.22–5.81)
RDW: 18 % — ABNORMAL HIGH (ref 11.5–15.5)
WBC: 6.3 K/uL (ref 4.0–10.5)
nRBC: 0 % (ref 0.0–0.2)

## 2023-12-07 NOTE — Plan of Care (Signed)
   Problem: Activity: Goal: Risk for activity intolerance will decrease Outcome: Progressing   Problem: Nutrition: Goal: Adequate nutrition will be maintained Outcome: Progressing   Problem: Coping: Goal: Level of anxiety will decrease Outcome: Progressing

## 2023-12-07 NOTE — Plan of Care (Signed)
  Problem: Clinical Measurements: Goal: Ability to maintain clinical measurements within normal limits will improve Outcome: Progressing Goal: Will remain free from infection Outcome: Progressing   Problem: Clinical Measurements: Goal: Will remain free from infection Outcome: Progressing   Problem: Activity: Goal: Risk for activity intolerance will decrease Outcome: Progressing   Problem: Nutrition: Goal: Adequate nutrition will be maintained Outcome: Progressing

## 2023-12-07 NOTE — Progress Notes (Incomplete)
 Internal Medicine Attending:  I personally saw and examined the patient. Discussed with the resident and agree with the resident's findings and plan of care as documented in the resident's note. No new acute events.  Will continue to trend his hgb and transfuse as needed. Due to MGUS? ESRD? Eben Reyes BROCKS, MD

## 2023-12-07 NOTE — Progress Notes (Signed)
 HD#34 SUBJECTIVE:  Patient Summary: This is a 63 year old male with past medical history of HFrEF, urinary retention with chronic Foley, chronic osteomyelitis of right foot who presented with concerns of lethargy found to have chronic osteo. He is now status post right BKA and now medically ready for discharge awaiting SNF placement.   Overnight Events: N/A  Interim History:  Patient is doing better just waiting for SNF to get a place. OBJECTIVE:  Vital Signs: Vitals:   12/07/23 0448 12/07/23 0500 12/07/23 0817 12/07/23 1642  BP: 97/64  96/63 95/67  Pulse:   97 89  Resp: 18  18 18   Temp: 98.9 F (37.2 C)  98.3 F (36.8 C) 98.2 F (36.8 C)  TempSrc:      SpO2: 96%  95% 100%  Weight:  62.4 kg    Height:       Supplemental O2: Room Air SpO2: 100 % O2 Flow Rate (L/min): 5 L/min  Filed Weights   11/29/23 0420 12/06/23 0556 12/07/23 0500  Weight: 60.2 kg 59.7 kg 62.4 kg     Intake/Output Summary (Last 24 hours) at 12/07/2023 1728 Last data filed at 12/07/2023 0600 Gross per 24 hour  Intake 180 ml  Output 1000 ml  Net -820 ml   Net IO Since Admission: -32,102.81 mL [12/07/23 1728]  Physical Exam: Physical Exam Physical Exam Constitutional: well-appearing in bed, no acute distress Cardiovascular: Regular rate and rhythm, no murmurs, rubs, or gallops Pulmonary/Chest: normal work of breathing on room air, lungs clear to auscultation bilaterally Abdominal: soft, non-tender, non-distended MSK: Right BKA appreciated, staples removed and surgical site healing well Patient Lines/Drains/Airways Status     Active Line/Drains/Airways     Name Placement date Placement time Site Days   Peripheral IV 11/29/23 20 G 1 Left;Posterior Forearm 11/29/23  1231  Forearm  8   Urethral Catheter Mekides Nida, RN Coude 16 Fr. 11/21/23  1522  Coude  16   Wound 11/16/23 0826 Surgical  Sacrum Left 11/16/23  0826  Sacrum  21   Wound 11/17/23 1030 Surgical Closed Surgical Incision Knee Right  11/17/23  1030  Knee  20            Pertinent labs and imaging:      Latest Ref Rng & Units 12/07/2023    5:07 AM 12/05/2023    5:29 AM 12/03/2023    4:54 AM  CBC  WBC 4.0 - 10.5 K/uL 6.3  8.6  6.3   Hemoglobin 13.0 - 17.0 g/dL 7.0  7.5  7.6   Hematocrit 39.0 - 52.0 % 22.0  23.4  24.3   Platelets 150 - 400 K/uL 376  361  342        Latest Ref Rng & Units 12/07/2023    5:07 AM 12/05/2023    5:29 AM 12/03/2023    4:54 AM  CMP  Glucose 70 - 99 mg/dL 90  895  866   BUN 8 - 23 mg/dL 71  60  53   Creatinine 0.61 - 1.24 mg/dL 6.86  7.43  7.83   Sodium 135 - 145 mmol/L 132  131  132   Potassium 3.5 - 5.1 mmol/L 5.0  4.9  4.2   Chloride 98 - 111 mmol/L 95  96  96   CO2 22 - 32 mmol/L 27  26  26    Calcium  8.9 - 10.3 mg/dL 8.7  8.5  8.3     No results found.  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 male with past medical history of HFrEF, urinary retention with chronic Foley, chronic osteomyelitis of right foot who presented with concerns of lethargy found to have chronic osteo.  He is now status post right BKA and now medically ready for discharge awaiting SNF placement.  #Fever Resolved.  -blood culture and urine culture pending #Fatigue and Anxiety Improved. patient appeared alert and pleasant  during the visit but expressed feeling tired from being in the hospital and not sleeping well at night due to frequent staff interruptions. Started melatonin to help with sleep. #Status post BKA of right foot #Chronic osteomyelitis of right foot, resolved Patient is postop day 19.  - staples removed - PT/OT following, recommending SNF #CAUTI Resolved. Patient had UTI during hospitalization.  Changed out catheter.  Last catheter change was 07/22/ 2025.  No acute concerns anymore.  Completed treatment with ceftriaxone . - Can follow-up outpatient for catheter management #Concern for multiple myeloma Patient had SPEP with positive M spike as well as elevated protein gap.  Bone marrow biopsy finding concerns of multiple myeloma.  Can follow-up outpatient. 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  No acute concerns.   - Continue to monitor BMP #AKI on suspected CKD #Hyperkalemia Last creatinine at 3.13.  Seems to be at baseline.  I do wonder if he has a component of CKD.  Will continue to trend kidney function. last Potassium 5.  Continue monitoring electrolytes and creatinine. - Can follow kidney function tomorrow - Patient to drink good amount of water  #HFrEF Echo 10/12/2023 showed EF of 20 to 25%.  Patient seems  euvolemic on exam.  No concern for acute exacerbation.  Patient is on metoprolol  tartrate 25 mg twice daily, however we should probably transition to 50 mg daily as succinate as shown to improve mortality morbidity in HFrEF patients. - Transition to metoprolol  succinate 50 mg daily starting tomorrow - If renal function does start to improve, can potentially start SGLT2 and spironolactone  or Entresto  #History of LV thrombus #New subscapular splenic infarct - No acute concerns at this time -  Continue Eliquis  5 mg twice daily #Type 2 diabetes mellitus - Medications at this time currently include Semglee  15 units daily, NovoLog  3 units 3 times daily with meals, as well as sliding scale.  Sugars have been measuring well. Plan continue carb modified diet - Continue current insulin  regimen - Follow-up A1c outpatient when patient is due #Social situation Tough social situation.  Patient is still working on getting short-term disability and insurance to get approved for SNF.  Will continue to follow for progression.  He is medically ready for  discharge.  Awaiting safe dispo plan.   Diet: Normal IVF: None,None VTE: DOAC Code: Full PT/OT recs: SNF for Subacute PT TOC recs: Getting paperwork completed for short term disability  Family Update:     Dispo: Anticipated discharge to Skilled nursing facility in 5 days pending placement.  Signature:  Delbert Darley [facility]  5:28 PM, 12/07/2023  On Call pager [phone]

## 2023-12-07 NOTE — TOC Progression Note (Signed)
 Transition of Care [facility]) - Progression Note    Patient Details  Name: Kingdom Vanzanten MRN: [id number] Date of Birth: [birth year]  Transition of Care [facility]) CM/SW Contact  [name] [place], [name] Phone Number: 12/07/2023, 2:52 PM  Clinical Narrative:     CSW emailed with pt's union rep. STD extension still pending at this time.   Expected Discharge Plan: Skilled Nursing Facility Barriers to Discharge: Inadequate or no insurance               Expected Discharge Plan and Services In-house Referral: Clinical Social Work   Post Acute Care Choice: Skilled Nursing Facility Living arrangements for the past 2 months: Homeless Shelter                                       Social Drivers of Health (SDOH) Interventions SDOH Screenings   Food Insecurity: No Food Insecurity (11/02/2023)  Housing: High Risk (11/02/2023)  Transportation Needs: Unmet Transportation Needs (11/02/2023)  Utilities: Not At Risk (11/02/2023)  Alcohol Screen: Low Risk  (08/19/2022)  Depression (PHQ2-9): Low Risk  (10/11/2023)  Financial Resource Strain: Low Risk  (08/19/2022)  Tobacco Use: Medium Risk (11/17/2023)    Readmission Risk Interventions     No data to display

## 2023-12-07 NOTE — Progress Notes (Signed)
 Mobility Specialist Progress Note:    12/07/23 1148  Mobility  Activity Pivoted/transferred to/from [facility];Ambulated with assistance  Level of Assistance Minimal assist, patient does 75% or more (+2)  Assistive Device Front wheel walker  Distance Ambulated (ft) 5 ft  RLE Weight Bearing Per Provider Order NWB  Activity Response Tolerated well  Mobility Referral Yes  Mobility visit 1 Mobility  Mobility Specialist Start Time (ACUTE ONLY) 1016  Mobility Specialist Stop Time (ACUTE ONLY) 1030  Mobility Specialist Time Calculation (min) (ACUTE ONLY) 14 min   Pt received in bed agreeable to mobility. Was able to get to EOB w/ no physical assistance required MinA+2 to stand. Upon standing pt had a BM in the bed, assisted w/ pericare and linen change. Once finished w/ BSC, assisted pt over to the chair. Call bell and personal belongings in reach. All needs met. NT aware.  Thersia Minder Mobility Specialist  Please contact vis Secure Chat or  Rehab Office [phone]

## 2023-12-08 DIAGNOSIS — M86661 Other chronic osteomyelitis, right tibia and fibula: Secondary | ICD-10-CM | POA: Diagnosis not present

## 2023-12-08 LAB — BASIC METABOLIC PANEL WITH GFR
Anion gap: 12 (ref 5–15)
BUN: 75 mg/dL — ABNORMAL HIGH (ref 8–23)
CO2: 26 mmol/L (ref 22–32)
Calcium: 8.5 mg/dL — ABNORMAL LOW (ref 8.9–10.3)
Chloride: 93 mmol/L — ABNORMAL LOW (ref 98–111)
Creatinine, Ser: 3.15 mg/dL — ABNORMAL HIGH (ref 0.61–1.24)
GFR, Estimated: 21 mL/min — ABNORMAL LOW (ref >60)
Glucose, Bld: 164 mg/dL — ABNORMAL HIGH (ref 70–99)
Potassium: 5.2 mmol/L — ABNORMAL HIGH (ref 3.5–5.1)
Sodium: 131 mmol/L — ABNORMAL LOW (ref 135–145)

## 2023-12-08 LAB — GLUCOSE, CAPILLARY
Glucose-Capillary: 169 mg/dL — ABNORMAL HIGH (ref 70–99)
Glucose-Capillary: 164 mg/dL — ABNORMAL HIGH (ref 70–99)
Glucose-Capillary: 51 mg/dL — ABNORMAL LOW (ref 70–99)
Glucose-Capillary: 130 mg/dL — ABNORMAL HIGH (ref 70–99)

## 2023-12-08 LAB — CBC
HCT: 22.8 % — ABNORMAL LOW (ref 39.0–52.0)
Hemoglobin: 7 g/dL — ABNORMAL LOW (ref 13.0–17.0)
MCH: 26.9 pg (ref 26.0–34.0)
MCHC: 30.7 g/dL (ref 30.0–36.0)
MCV: 87.7 fL (ref 80.0–100.0)
Platelets: 379 K/uL (ref 150–400)
RBC: 2.6 MIL/uL — ABNORMAL LOW (ref 4.22–5.81)
RDW: 17.9 % — ABNORMAL HIGH (ref 11.5–15.5)
WBC: 5 K/uL (ref 4.0–10.5)
nRBC: 0 % (ref 0.0–0.2)

## 2023-12-08 MED ORDER — DEXTROSE 50 % IV SOLN
INTRAVENOUS | Status: AC
  Filled 2023-12-08: qty 50

## 2023-12-08 MED ORDER — LIDOCAINE HCL URETHRAL/MUCOSAL 2 % EX GEL
1.0000 | Freq: Once | CUTANEOUS | Status: DC
  Filled 2023-12-08: qty 6

## 2023-12-08 MED ORDER — DEXTROSE 50 % IV SOLN
1.0000 | Freq: Once | INTRAVENOUS | Status: AC
  Administered 2023-12-08: 50 mL via INTRAVENOUS

## 2023-12-08 MED ORDER — SODIUM ZIRCONIUM CYCLOSILICATE 10 G PO PACK
10.0000 g | PACK | Freq: Once | ORAL | Status: AC
  Administered 2023-12-08: 10 g via ORAL
  Filled 2023-12-08: qty 1

## 2023-12-08 NOTE — Progress Notes (Signed)
 Foley catheter removed and replaced

## 2023-12-08 NOTE — TOC Progression Note (Addendum)
 Transition of Care [facility]) - Progression Note    Patient Details  Name: Derek Thompson MRN: [id number] Date of Birth: 04-Sep-1960  [facility]) CM/SW Contact  [name] [place], [name] Phone Number: 12/08/2023, 12:53 PM  Clinical Narrative:     Pt's short term dissability still pending.   Financial Navigator notified CSW hat pt's previous medicaid application was closed out and a new one would need to be opened. CSW met with pt and updated him; explained that previously pay stubs and bank statements were not provided to DSS. Pt states he would be able to provide those to DSS now and pointed to a sealed folder next to him and states they are bank statements. CSW contacted financial counseling and requested submitting new medicaid application.   Expected Discharge Plan: Skilled Nursing Facility Barriers to Discharge: Inadequate or no insurance               Expected Discharge Plan and Services In-house Referral: Clinical Social Work   Post Acute Care Choice: Skilled Nursing Facility Living arrangements for the past 2 months: Homeless Shelter                                       Social Drivers of Health (SDOH) Interventions SDOH Screenings   Food Insecurity: No Food Insecurity (11/02/2023)  Housing: High Risk (11/02/2023)  Transportation Needs: Unmet Transportation Needs (11/02/2023)  Utilities: Not At Risk (11/02/2023)  Alcohol Screen: Low Risk  (08/19/2022)  Depression (PHQ2-9): Low Risk  (10/11/2023)  Financial Resource Strain: Low Risk  (08/19/2022)  Tobacco Use: Medium Risk (11/17/2023)    Readmission Risk Interventions     No data to display

## 2023-12-08 NOTE — Progress Notes (Signed)
 Patient glucose 51. Patient asymptomatic. MD notified. D50 ordered

## 2023-12-08 NOTE — Plan of Care (Signed)
   Problem: Nutrition: Goal: Adequate nutrition will be maintained Outcome: Progressing   Problem: Coping: Goal: Level of anxiety will decrease Outcome: Progressing   Problem: Elimination: Goal: Will not experience complications related to bowel motility Outcome: Progressing

## 2023-12-08 NOTE — Progress Notes (Signed)
 Physical Therapy Treatment Patient Details Name: Derek Thompson MRN: [id number] DOB: 11/24/1960 Today's Date: 12/08/2023   History of Present Illness Pt is a 63 y/o M presenting to ED on 11/02/23 with lethargy. Pt found to be tachycardic, hyperkalemic, and hyponatremic. 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    PT Comments  Pt seen for PT tx with pt agreeable. Pt is able to complete bed mobility with mod I, sit>stand from EOB, recliner, BSC with CGA increasing to min assist, assistance to power up to standing. Pt requires min<>mod assist for stand/step pivot transfers with cuing re: technique & assistance for RW management. Pt with multiple incontinent BMs & pt unaware. Pt assisted on BSC during session, requires total assist for peri hygiene & assistance with changing into a clean gown. After multiple transfers & toileting pt declines ambulation attempts 2/2 fatigue. Will continue to follow pt acutely to progress mobility as able. Recommend post acute rehab <3 hours therapy/day upon d/c.    If plan is discharge home, recommend the following: A lot of help with bathing/dressing/bathroom;Supervision due to cognitive status;Help with stairs or ramp for entrance;A lot of help with walking and/or transfers;Assistance with cooking/housework;Assist for transportation   Can travel by private vehicle     No  Equipment Recommendations  Rolling walker (2 wheels);BSC/3in1;Wheelchair (measurements PT);Wheelchair cushion (measurements PT)    Recommendations for Other Services       Precautions / Restrictions Precautions Precautions: Fall;Other (comment) Precaution/Restrictions Comments: R BKA- no pillows under knee Required Braces or  Orthoses: Other Brace Other Brace: R limb guard protector Restrictions Weight Bearing Restrictions Per Provider Order: Yes RLE Weight Bearing Per Provider Order: Non weight bearing Other Position/Activity Restrictions: Educated in wearing limb guard when OOB and during sleeping to encourage R knee extension.     Mobility  Bed Mobility   Bed Mobility: Supine to Sit     Supine to sit: Modified independent (Device/Increase time), Used rails, HOB elevated (exits R side of bed)          Transfers Overall transfer level: Needs assistance Equipment used: Rolling walker (2 wheels) Transfers: Sit to/from Stand Sit to Stand: Min assist   Step pivot transfers: Min assist, Mod assist (mod assist on first attempt, min assist on others, assistance for RW management to turn, cuing for sequencing)       General transfer comment: extra time to power up to standing, cuing re: hand placement during stand>sit but fair return demo    Ambulation/Gait                   Stairs             Wheelchair Mobility     Tilt Bed    Modified Rankin (Stroke Patients Only)       Balance Overall balance assessment: Needs assistance Sitting-balance support: No upper extremity supported, Feet supported Sitting balance-Leahy Scale: Good Sitting balance - Comments: supervision sitting EOB & on BSC   Standing balance support: Bilateral upper extremity supported, Reliant on assistive device for balance, During functional activity Standing balance-Leahy Scale: Poor  Communication Communication Communication: Impaired Factors Affecting Communication: Difficulty expressing self (speaks at low volume)  Cognition Arousal: Alert Behavior During Therapy: Flat affect   PT - Cognitive impairments: Awareness, Safety/Judgement                       PT - Cognition Comments: Pt elevates foot rest, knocking over RW. Incontinent BM & pt  unaware.   Following commands impaired: Follows one step commands with increased time    Cueing Cueing Techniques: Verbal cues  Exercises Other Exercises Other Exercises: Pt able to assist with positioning RLE limb guard but PT assists with securing it.    General Comments General comments (skin integrity, edema, etc.): multiple incontinent BMs, total assist for peri hygiene & changing into clean gown.      Pertinent Vitals/Pain Pain Assessment Pain Assessment: Faces Faces Pain Scale: No hurt    Home Living                          Prior Function            PT Goals (current goals can now be found in the care plan section) Acute Rehab PT Goals Patient Stated Goal: to get to rehab PT Goal Formulation: With patient Time For Goal Achievement: 12/15/23 Potential to Achieve Goals: Good Additional Goals Additional Goal #1: Pt will propel w/c with BUE's x 150 ft with supervision Progress towards PT goals: Progressing toward goals    Frequency    Min 2X/week      PT Plan      Co-evaluation              AM-PAC PT 6 Clicks Mobility   Outcome Measure  Help needed turning from your back to your side while in a flat bed without using bedrails?: None Help needed moving from lying on your back to sitting on the side of a flat bed without using bedrails?: A Little Help needed moving to and from a bed to a chair (including a wheelchair)?: A Little Help needed standing up from a chair using your arms (e.g., wheelchair or bedside chair)?: A Little Help needed to walk in hospital room?: A Lot Help needed climbing 3-5 steps with a railing? : Total 6 Click Score: 16    End of Session Equipment Utilized During Treatment: Gait belt Activity Tolerance: Patient tolerated treatment well;Patient limited by fatigue Patient left: in chair;with chair alarm set;with call bell/phone within reach (RLE limb guard donned)   PT Visit Diagnosis: Unsteadiness on feet  (R26.81);Difficulty in walking, not elsewhere classified (R26.2);Other abnormalities of gait and mobility (R26.89);Muscle weakness (generalized) (M62.81)     Time: 8686-8656 PT Time Calculation (min) (ACUTE ONLY): 30 min  Charges:    $Therapeutic Activity: 23-37 mins PT General Charges $$ ACUTE PT VISIT: 1 Visit                     Richerd Pinal, PT, DPT 12/08/23, 1:51 PM   Richerd CHRISTELLA Pinal 12/08/2023, 1:49 PM

## 2023-12-08 NOTE — Progress Notes (Addendum)
 HD#35 SUBJECTIVE:  Patient Summary:Derek Thompson is a 63 y.o. male with past medical history of HFrEF, urinary retention with chronic Foley, chronic osteomyelitis of right foot who presented with lethargy. He is now status post right BKA and now medically ready for discharge awaiting SNF placement.    Overnight Events: N/A  Interim History:  Patient is doing good just waiting for SNF to get a place.  OBJECTIVE:  Vital Signs: Vitals:   12/07/23 1642 12/07/23 2030 12/08/23 0644 12/08/23 0835  BP: 95/67 102/64 98/63 99/64   Pulse: 89 99 88 100  Resp: 18 18 18 19   Temp: 98.2 F (36.8 C) 98.2 F (36.8 C) 98.9 F (37.2 C) 98.1 F (36.7 C)  TempSrc:   Oral   SpO2: 100% 96% 98% 98%  Weight:      Height:       Supplemental O2: Room Air SpO2: 98 % O2 Flow Rate (L/min): 5 L/min  Filed Weights   11/29/23 0420 12/06/23 0556 12/07/23 0500  Weight: 60.2 kg 59.7 kg 62.4 kg     Intake/Output Summary (Last 24 hours) at 12/08/2023 1151 Last data filed at 12/08/2023 0900 Gross per 24 hour  Intake 640 ml  Output 2600 ml  Net -1960 ml   Net IO Since Admission: -34,062.81 mL [12/08/23 1151]  Physical Exam: Physical Exam Constitutional: well-appearing in bed, no acute distress Cardiovascular: Regular rate and rhythm, no murmurs, rubs, or gallops Pulmonary/Chest: normal work of breathing on room air, lungs clear to auscultation bilaterally Abdominal: soft, non-tender, non-distended MSK: Right BKA appreciated, staples removed and surgical site healing well Patient Lines/Drains/Airways Status     Active Line/Drains/Airways     Name Placement date Placement time Site Days   Peripheral IV 11/29/23 20 G 1 Left;Posterior Forearm 11/29/23  1231  Forearm  9   Urethral Catheter Mekides Nida, RN Coude 16 Fr. 11/21/23  1522  Coude  17   Wound 11/16/23 0826 Surgical  Sacrum Left 11/16/23  0826  Sacrum  22   Wound 11/17/23 1030 Surgical Closed Surgical Incision Knee Right 11/17/23  1030   Knee  21            Pertinent labs and imaging:      Latest Ref Rng & Units 12/08/2023    8:48 AM 12/07/2023    5:32 PM 12/07/2023    5:07 AM  CBC  WBC 4.0 - 10.5 K/uL 5.0   6.3   Hemoglobin 13.0 - 17.0 g/dL 7.0  8.3  7.0   Hematocrit 39.0 - 52.0 % 22.8  28.6  22.0   Platelets 150 - 400 K/uL 379   376        Latest Ref Rng & Units 12/08/2023    8:48 AM 12/07/2023    5:07 AM 12/05/2023    5:29 AM  CMP  Glucose 70 - 99 mg/dL 835  90  895   BUN 8 - 23 mg/dL 75  71  60   Creatinine 0.61 - 1.24 mg/dL 6.84  6.86  7.43   Sodium 135 - 145 mmol/L 131  132  131   Potassium 3.5 - 5.1 mmol/L 5.2  5.0  4.9   Chloride 98 - 111 mmol/L 93  95  96   CO2 22 - 32 mmol/L 26  27  26    Calcium  8.9 - 10.3 mg/dL 8.5  8.7  8.5     No results found.  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 male with past medical history of HFrEF, urinary retention with chronic Foley, chronic osteomyelitis of right foot who presented with concerns of lethargy found to have chronic osteo.  He is now status post right BKA and now medically ready for discharge awaiting SNF placement.  #Fever Resolved.  -blood culture and urine culture negative #Fatigue and Anxiety Improved. patient appeared alert and pleasant during the visit but  expressed feeling tired from being in the hospital and not sleeping well at night due to frequent staff interruptions. Started melatonin to help with sleep. -Ground privilege #Status post BKA of right foot #Chronic osteomyelitis of right foot, resolved Patient is postop day 20.  - staples removed - PT/OT following, recommending SNF #CAUTI Resolved. Patient had UTI during hospitalization.  Changed out catheter.  Last catheter change was 07/22/ 2025.  No acute concerns anymore.  Completed treatment with ceftriaxone . - Can follow-up outpatient for catheter management #Concern for multiple myeloma Patient had SPEP with positive M spike as well as elevated protein gap.  Bone marrow biopsy finding concerns of multiple myeloma.  Can follow-up outpatient. HB: 8.3 - Consulted oncology who will follow-up with patient outpatient - Monitor renal function and support electrolyte status - Check H&H #Hyponatremia Chronically low sodium, likely in setting of multiple myeloma with gammopathy.  No acute concerns.   - Continue to monitor BMP #AKI on suspected CKD #Hyperkalemia Last creatinine at 3.15.  Seems to be at baseline.  I do wonder if he has a component of CKD.  Will continue to trend kidney function. last Potassium 5.2.  Continue monitoring electrolytes and creatinine. - Can follow kidney function tomorrow - Patient to drink good amount of water  -lokelma  10 gr once #HFrEF Echo 10/12/2023 showed EF of 20 to 25%.  Patient seems  euvolemic on exam.  No concern for acute exacerbation.  Patient is on metoprolol  tartrate 25 mg twice daily, however we should probably transition to 50 mg daily as succinate as shown to improve mortality morbidity in HFrEF patients. - Transition to metoprolol  succinate 50 mg daily starting tomorrow - If renal function does start to improve, can potentially start SGLT2 and spironolactone  or Entresto  #History of LV thrombus #New subscapular splenic infarct - No acute  concerns at this time - Continue Eliquis  5 mg twice daily #Type 2 diabetes mellitus - Medications at this time currently include Semglee  15 units daily, NovoLog  3 units 3 times daily with meals, as well as sliding scale.  Sugars have been measuring well. Plan continue carb modified diet - Continue current insulin  regimen - Follow-up A1c outpatient when patient is due #Social situation Tough social situation.  Patient is still working on getting short-term disability and insurance to get approved for SNF.  Will continue to follow for progression.  He is medically  ready for discharge.  Awaiting safe dispo plan.   Diet: Normal IVF: None,None VTE: DOAC Code: Full PT/OT recs: SNF for Subacute PT TOC recs: Getting paperwork completed for short term disability  Family Update:     Dispo: Anticipated discharge to Skilled nursing facility in 5 days pending placement. Signature:  Jenaro Souder [facility]  11:51 AM, 12/08/2023  On Call pager [phone]

## 2023-12-08 NOTE — Progress Notes (Signed)
 OT Cancellation Note  Patient Details Name: Rashard Ryle MRN: [id number] DOB: May 06, 1960   Cancelled Treatment:    Reason Eval/Treat Not Completed:  (pt outdoors with staff, left green theraband in room for next visit to establish UE HEP)  [name] 12/08/2023, 3:36 PM

## 2023-12-09 DIAGNOSIS — M86171 Other acute osteomyelitis, right ankle and foot: Secondary | ICD-10-CM | POA: Diagnosis not present

## 2023-12-09 DIAGNOSIS — E1169 Type 2 diabetes mellitus with other specified complication: Secondary | ICD-10-CM | POA: Diagnosis not present

## 2023-12-09 DIAGNOSIS — N179 Acute kidney failure, unspecified: Secondary | ICD-10-CM | POA: Diagnosis not present

## 2023-12-09 DIAGNOSIS — E1165 Type 2 diabetes mellitus with hyperglycemia: Secondary | ICD-10-CM | POA: Diagnosis not present

## 2023-12-09 LAB — CBC
HCT: 24.7 % — ABNORMAL LOW (ref 39.0–52.0)
Hemoglobin: 7.8 g/dL — ABNORMAL LOW (ref 13.0–17.0)
MCH: 27.4 pg (ref 26.0–34.0)
MCHC: 31.6 g/dL (ref 30.0–36.0)
MCV: 86.7 fL (ref 80.0–100.0)
Platelets: 415 K/uL — ABNORMAL HIGH (ref 150–400)
RBC: 2.85 MIL/uL — ABNORMAL LOW (ref 4.22–5.81)
RDW: 17.8 % — ABNORMAL HIGH (ref 11.5–15.5)
WBC: 5.6 K/uL (ref 4.0–10.5)
nRBC: 0 % (ref 0.0–0.2)

## 2023-12-09 LAB — BASIC METABOLIC PANEL WITH GFR
Anion gap: 10 (ref 5–15)
BUN: 78 mg/dL — ABNORMAL HIGH (ref 8–23)
CO2: 29 mmol/L (ref 22–32)
Calcium: 8.8 mg/dL — ABNORMAL LOW (ref 8.9–10.3)
Chloride: 94 mmol/L — ABNORMAL LOW (ref 98–111)
Creatinine, Ser: 3.14 mg/dL — ABNORMAL HIGH (ref 0.61–1.24)
GFR, Estimated: 21 mL/min — ABNORMAL LOW (ref >60)
Glucose, Bld: 82 mg/dL (ref 70–99)
Potassium: 5.2 mmol/L — ABNORMAL HIGH (ref 3.5–5.1)
Sodium: 133 mmol/L — ABNORMAL LOW (ref 135–145)

## 2023-12-09 LAB — GLUCOSE, CAPILLARY
Glucose-Capillary: 73 mg/dL (ref 70–99)
Glucose-Capillary: 88 mg/dL (ref 70–99)
Glucose-Capillary: 129 mg/dL — ABNORMAL HIGH (ref 70–99)
Glucose-Capillary: 141 mg/dL — ABNORMAL HIGH (ref 70–99)
Glucose-Capillary: 151 mg/dL — ABNORMAL HIGH (ref 70–99)
Glucose-Capillary: 37 mg/dL — CL (ref 70–99)

## 2023-12-09 MED ORDER — DEXTROSE 50 % IV SOLN
INTRAVENOUS | Status: AC
  Administered 2023-12-09: 50 mL
  Filled 2023-12-09: qty 50

## 2023-12-09 MED ORDER — INSULIN GLARGINE-YFGN 100 UNIT/ML ~~LOC~~ SOLN
8.0000 [IU] | Freq: Every day | SUBCUTANEOUS | Status: DC
  Filled 2023-12-09: qty 0.08

## 2023-12-09 MED ORDER — SODIUM ZIRCONIUM CYCLOSILICATE 10 G PO PACK
10.0000 g | PACK | Freq: Once | ORAL | Status: AC
  Administered 2023-12-09: 10 g via ORAL
  Filled 2023-12-09: qty 1

## 2023-12-09 MED ORDER — LACTATED RINGERS IV SOLN: INTRAVENOUS | Status: AC

## 2023-12-09 MED ORDER — DEXTROSE 50 % IV SOLN: 25.0000 g | INTRAVENOUS | Status: AC

## 2023-12-09 NOTE — Plan of Care (Signed)
  Problem: Clinical Measurements: Goal: Ability to maintain clinical measurements within normal limits will improve Outcome: Progressing Goal: Will remain free from infection Outcome: Progressing   Problem: Activity: Goal: Risk for activity intolerance will decrease Outcome: Progressing   Problem: Nutrition: Goal: Adequate nutrition will be maintained Outcome: Progressing   Problem: Coping: Goal: Level of anxiety will decrease Outcome: Progressing   Problem: Elimination: Goal: Will not experience complications related to bowel motility Outcome: Progressing Goal: Will not experience complications related to urinary retention Outcome: Progressing   Problem: Pain Managment: Goal: General experience of comfort will improve and/or be controlled Outcome: Progressing   Problem: Safety: Goal: Ability to remain free from injury will improve Outcome: Progressing   Problem: Skin Integrity: Goal: Risk for impaired skin integrity will decrease Outcome: Progressing   Problem: Education: Goal: Ability to describe self-care measures that may prevent or decrease complications (Diabetes Survival Skills Education) will improve Outcome: Progressing Goal: Individualized Educational Video(s) Outcome: Progressing   Problem: Coping: Goal: Ability to adjust to condition or change in health will improve Outcome: Progressing   Problem: Fluid Volume: Goal: Ability to maintain a balanced intake and output will improve Outcome: Progressing   Problem: Health Behavior/Discharge Planning: Goal: Ability to identify and utilize available resources and services will improve Outcome: Progressing Goal: Ability to manage health-related needs will improve Outcome: Progressing   Problem: Metabolic: Goal: Ability to maintain appropriate glucose levels will improve Outcome: Progressing   Problem: Nutritional: Goal: Maintenance of adequate nutrition will improve Outcome: Progressing Goal: Progress  toward achieving an optimal weight will improve Outcome: Progressing   Problem: Skin Integrity: Goal: Risk for impaired skin integrity will decrease Outcome: Progressing   Problem: Tissue Perfusion: Goal: Adequacy of tissue perfusion will improve Outcome: Progressing

## 2023-12-09 NOTE — Progress Notes (Signed)
 HD#36 SUBJECTIVE:  Patient Summary: Derek Thompson is a 63 y.o. male with past medical history of HFrEF, urinary retention with chronic Foley, chronic osteomyelitis of right foot who presented with lethargy. He is now status post right BKA and now medically ready for discharge awaiting SNF placement.      Overnight Events: N/A  Interim History:  Patient is doing good just waiting for SNF to get a place.    OBJECTIVE:  Vital Signs: Vitals:   12/08/23 1623 12/08/23 2236 12/09/23 0751 12/09/23 0855  BP: 98/63 (!) 92/52 (!) 95/59 (!) 102/54  Pulse: 92 89 (!) 103 (!) 103  Resp: 18 18 18 18   Temp: 98.3 F (36.8 C) 98 F (36.7 C) 98.5 F (36.9 C) 98.2 F (36.8 C)  TempSrc: Oral Oral Oral Oral  SpO2: 99% 97% 96% 97%  Weight:      Height:       Supplemental O2: Room Air SpO2: 97 % O2 Flow Rate (L/min): 5 L/min  Filed Weights   11/29/23 0420 12/06/23 0556 12/07/23 0500  Weight: 60.2 kg 59.7 kg 62.4 kg     Intake/Output Summary (Last 24 hours) at 12/09/2023 1100 Last data filed at 12/09/2023 0528 Gross per 24 hour  Intake 640 ml  Output 1950 ml  Net -1310 ml   Net IO Since Admission: -35,372.81 mL [12/09/23 1100]  Physical Exam: Constitutional: well-appearing in bed, no acute distress Cardiovascular: Regular rate and rhythm, no murmurs, rubs, or gallops Pulmonary/Chest: normal work of breathing on room air, lungs clear to auscultation bilaterally Abdominal: soft, non-tender, non-distended MSK: Right BKA appreciated, staples removed and surgical site healing well  Patient Lines/Drains/Airways Status     Active Line/Drains/Airways     Name Placement date Placement time Site Days   Peripheral IV 11/29/23 20 G 1 Left;Posterior Forearm 11/29/23  1231  Forearm  10   Urethral Catheter Mekides Nida, RN Coude 16 Fr. 12/08/23  1826  Coude  1   Wound 11/16/23 0826 Surgical  Sacrum Left 11/16/23  0826  Sacrum  23   Wound 11/17/23 1030 Surgical Closed Surgical Incision  Knee Right 11/17/23  1030  Knee  22            Pertinent labs and imaging:      Latest Ref Rng & Units 12/09/2023    4:34 AM 12/08/2023    8:48 AM 12/07/2023    5:32 PM  CBC  WBC 4.0 - 10.5 K/uL 5.6  5.0    Hemoglobin 13.0 - 17.0 g/dL 7.8  7.0  8.3   Hematocrit 39.0 - 52.0 % 24.7  22.8  28.6   Platelets 150 - 400 K/uL 415  379         Latest Ref Rng & Units 12/09/2023    4:34 AM 12/08/2023    8:48 AM 12/07/2023    5:07 AM  CMP  Glucose 70 - 99 mg/dL 82  835  90   BUN 8 - 23 mg/dL 78  75  71   Creatinine 0.61 - 1.24 mg/dL 6.85  6.84  6.86   Sodium 135 - 145 mmol/L 133  131  132   Potassium 3.5 - 5.1 mmol/L 5.2  5.2  5.0   Chloride 98 - 111 mmol/L 94  93  95   CO2 22 - 32 mmol/L 29  26  27    Calcium  8.9 - 10.3 mg/dL 8.8  8.5  8.7     No results found.  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 male with past medical history of HFrEF, urinary retention with chronic Foley, chronic osteomyelitis of right foot who presented with concerns of lethargy found to have chronic osteo.  He is now status post right BKA and now medically ready for discharge awaiting SNF placement.   #AKI on suspected CKD #Hyperkalemia Last creatinine at 3.3. last Potassium 5.2.  Continue monitoring electrolytes and  creatinine. - IVF LR 100 cc/hr - drink good amount of water  - lokelma  10 gr once  #Fever Resolved.  -blood culture and urine culture negative #Fatigue and Anxiety Resolved. patient appeared alert and pleasant during the visit but expressed feeling tired from being in the hospital and not sleeping well at night due to frequent staff interruptions. Started melatonin to help with sleep. -Ground privilege #Status post BKA of right foot #Chronic osteomyelitis of right foot, resolved Patient is postop day 22.  - staples removed - PT/OT following, recommending SNF #CAUTI Resolved. Patient had UTI during hospitalization.  Changed out catheter.  Last catheter change was 07/22/ 2025.  No acute concerns anymore.  Completed treatment with ceftriaxone . - Can follow-up outpatient for catheter management #Concern for multiple myeloma Patient had SPEP with positive M spike as well as elevated protein gap.  Bone marrow biopsy finding concerns of multiple myeloma.  Can follow-up outpatient. HB: 8.3 - Consulted oncology who will follow-up with patient outpatient - Monitor renal function and support electrolyte status - Check H&H #Hyponatremia Chronically low sodium, likely in setting of multiple myeloma with gammopathy.  No acute concerns.   - Continue to monitor BMP #HFrEF Echo 10/12/2023 showed EF of 20 to 25%.  Patient seems  euvolemic on exam.  No concern for acute exacerbation.  Patient is on metoprolol  tartrate 25 mg twice daily, however we should probably transition to 50 mg daily as succinate as shown to improve mortality morbidity in HFrEF patients. - Transition to metoprolol  succinate 50 mg daily starting tomorrow - If renal function does start to improve, can potentially start SGLT2 and spironolactone  or Entresto  #History of LV thrombus #New subscapular splenic infarct - No acute concerns at this time - Continue Eliquis  5 mg twice daily #Type 2 diabetes mellitus - Medications at this time  currently include Semglee  15 units daily, NovoLog  3 units 3 times daily with meals, as well as sliding scale.  Sugars have been measuring well. Plan continue carb modified diet - Continue current insulin  regimen - Follow-up A1c outpatient when patient is due #Social situation Tough social situation.  Patient is still working on getting short-term disability and insurance to get approved for SNF.  Will continue to follow for progression.  He is medically ready for discharge.  Awaiting safe dispo plan.   Diet: Normal IVF: None,None VTE:  DOAC Code: Full PT/OT recs: SNF for Subacute PT TOC recs: Getting paperwork completed for short term disability  Family Update:     Dispo: Anticipated discharge to Skilled nursing facility in 5 days pending placement. Signature:  Pelagia Iacobucci [facility]  11:00 AM, 12/09/2023  On Call pager [phone]

## 2023-12-10 DIAGNOSIS — Z86718 Personal history of other venous thrombosis and embolism: Secondary | ICD-10-CM

## 2023-12-10 DIAGNOSIS — E11628 Type 2 diabetes mellitus with other skin complications: Secondary | ICD-10-CM

## 2023-12-10 DIAGNOSIS — E1169 Type 2 diabetes mellitus with other specified complication: Secondary | ICD-10-CM | POA: Diagnosis not present

## 2023-12-10 DIAGNOSIS — Z7901 Long term (current) use of anticoagulants: Secondary | ICD-10-CM

## 2023-12-10 DIAGNOSIS — M86671 Other chronic osteomyelitis, right ankle and foot: Secondary | ICD-10-CM | POA: Diagnosis not present

## 2023-12-10 DIAGNOSIS — Z89511 Acquired absence of right leg below knee: Secondary | ICD-10-CM | POA: Diagnosis not present

## 2023-12-10 LAB — BASIC METABOLIC PANEL WITH GFR
Anion gap: 11 (ref 5–15)
BUN: 67 mg/dL — ABNORMAL HIGH (ref 8–23)
CO2: 27 mmol/L (ref 22–32)
Calcium: 8.9 mg/dL (ref 8.9–10.3)
Chloride: 95 mmol/L — ABNORMAL LOW (ref 98–111)
Creatinine, Ser: 2.93 mg/dL — ABNORMAL HIGH (ref 0.61–1.24)
GFR, Estimated: 23 mL/min — ABNORMAL LOW (ref >60)
Glucose, Bld: 103 mg/dL — ABNORMAL HIGH (ref 70–99)
Potassium: 4.8 mmol/L (ref 3.5–5.1)
Sodium: 133 mmol/L — ABNORMAL LOW (ref 135–145)

## 2023-12-10 LAB — CBC
HCT: 26.6 % — ABNORMAL LOW (ref 39.0–52.0)
Hemoglobin: 8.3 g/dL — ABNORMAL LOW (ref 13.0–17.0)
MCH: 26.9 pg (ref 26.0–34.0)
MCHC: 31.2 g/dL (ref 30.0–36.0)
MCV: 86.1 fL (ref 80.0–100.0)
Platelets: 435 K/uL — ABNORMAL HIGH (ref 150–400)
RBC: 3.09 MIL/uL — ABNORMAL LOW (ref 4.22–5.81)
RDW: 17.8 % — ABNORMAL HIGH (ref 11.5–15.5)
WBC: 5.9 K/uL (ref 4.0–10.5)
nRBC: 0 % (ref 0.0–0.2)

## 2023-12-10 LAB — GLUCOSE, CAPILLARY
Glucose-Capillary: 104 mg/dL — ABNORMAL HIGH (ref 70–99)
Glucose-Capillary: 167 mg/dL — ABNORMAL HIGH (ref 70–99)
Glucose-Capillary: 161 mg/dL — ABNORMAL HIGH (ref 70–99)
Glucose-Capillary: 56 mg/dL — ABNORMAL LOW (ref 70–99)
Glucose-Capillary: 60 mg/dL — ABNORMAL LOW (ref 70–99)
Glucose-Capillary: 157 mg/dL — ABNORMAL HIGH (ref 70–99)

## 2023-12-10 NOTE — Progress Notes (Addendum)
 HD#37 Subjective:   Summary: This is a 63 year old male with past medical history of HFrEF, urinary retention with chronic Foley, chronic osteomyelitis of right foot who presented with concerns of lethargy found to have chronic osteo.  He is now status post right BKA and now medically ready for discharge awaiting SNF placement.  Overnight Events: Had decreased blood sugar at 37  Patient evaluated at bedside this morning.  Patient evaluated bedside this morning.  He states he is doing well.  He was wondering about his insurance process.  I told him it is still in process.  He denies any pain.  He states he is using the bathroom well.  He has no other concerns.  Objective:  Vital signs in last 24 hours: Vitals:   12/09/23 1640 12/09/23 1943 12/10/23 0450 12/10/23 0500  BP: (!) 96/59 106/64 112/73   Pulse: 87 100 92   Resp: 18 17 17    Temp: 98.6 F (37 C) 98.7 F (37.1 C) 97.9 F (36.6 C)   TempSrc:  Oral Oral   SpO2: 98% 98% 99%   Weight:    62.2 kg  Height:       Supplemental O2: Room Air  Physical Exam:  Constitutional: well-appearing in bed, no acute distress Cardiovascular: Regular rate and rhythm, no murmurs, rubs, or gallops Pulmonary/Chest: normal work of breathing on room air, lungs clear to auscultation bilaterally Abdominal: Soft, nontender, nondistended, normoactive bowel sounds MSK: Right BKA appreciated, healing well, staples have been removed, no signs of infection, drainage, or fluid collection  Filed Weights   12/06/23 0556 12/07/23 0500 12/10/23 0500  Weight: 59.7 kg 62.4 kg 62.2 kg     Intake/Output Summary (Last 24 hours) at 12/10/2023 0614 Last data filed at 12/10/2023 0329 Gross per 24 hour  Intake 1801.31 ml  Output 1150 ml  Net 651.31 ml   Net IO Since Admission: -34,721.5 mL [12/10/23 0614]  Pertinent Labs:    Latest Ref Rng & Units 12/09/2023    4:34 AM 12/08/2023    8:48 AM 12/07/2023    5:32 PM  CBC  WBC 4.0 - 10.5 K/uL 5.6  5.0     Hemoglobin 13.0 - 17.0 g/dL 7.8  7.0  8.3   Hematocrit 39.0 - 52.0 % 24.7  22.8  28.6   Platelets 150 - 400 K/uL 415  379         Latest Ref Rng & Units 12/09/2023    4:34 AM 12/08/2023    8:48 AM 12/07/2023    5:07 AM  CMP  Glucose 70 - 99 mg/dL 82  835  90   BUN 8 - 23 mg/dL 78  75  71   Creatinine 0.61 - 1.24 mg/dL 6.85  6.84  6.86   Sodium 135 - 145 mmol/L 133  131  132   Potassium 3.5 - 5.1 mmol/L 5.2  5.2  5.0   Chloride 98 - 111 mmol/L 94  93  95   CO2 22 - 32 mmol/L 29  26  27    Calcium  8.9 - 10.3 mg/dL 8.8  8.5  8.7     Imaging: No results found.  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 male with past medical history of HFrEF, urinary retention with chronic Foley, chronic osteomyelitis of right foot who presented with concerns of lethargy found to have chronic osteo.  He is now status post right BKA and now medically ready for discharge awaiting SNF placement.  #Status post BKA of right foot #Chronic osteomyelitis of right foot, resolved Patient doing well postop.  No signs of further infection or complications.  He has been working with PT/OT in progressing well.  Patient is currently medically discharged and awaiting SNF placement.  -Monitor for signs of infection - PT/OT following -  Pending SNF placement  #AKI on suspected CKD #Hyponatremia #Hyperkalemia Creatinine has slowly risen over the past few days.  Most recent creatinine at 3.14 up from 2.16, 7 days ago.  Seems to stay around 3.1 for the past few days.  Did give gentle fluids.  Sodium seems to be stable.  Patient did have a little bit of elevated potassium yesterday at 5.2.  Creatinine down to 2.93 this morning. - 1 dose Lokelma  given - Monitor sodium - Gentle fluids - Monitor renal function  #Type 2 diabetes mellitus Patient is currently on Semglee  8 units daily, NovoLog  3 units with meals plus sliding scale.  Patient has had low blood sugars with one measured down at 37 overnight.  Will discontinue long-acting insulin  for now. - Stop Semglee  8 units nightly - Continue NovoLog  3 units 3 times daily with meals - Decrease intensity of sliding scale to sensitive - Follow-up A1c outpatient  #CAUTI, resolved Most recent catheter change on 12/08/2023.  No acute concerns at this time as infection has resolved. -Continue routine catheter care  #Concern for multiple myeloma Patient had SPEP with positive M spike as well as elevated protein gap.  Bone marrow biopsy finding concerns of multiple myeloma.  Can follow-up outpatient. - Consulted oncology during hospital stay who will follow-up with patient outpatient - Monitor renal function and support electrolyte status  #HFrEF Echo 10/12/2023 showed EF of 20 to 25%.  Patient seems euvolemic on exam.  Patient is now on metoprolol  succinate 50 mg daily.  No acute concern for exacerbation at this time.  -Continue metoprolol  succinate 50 mg daily - Renal function will not allow for spironolactone , Entresto , or SGLT2 at this time  #History of LV thrombus #New subscapular splenic infarct - No acute concerns at this time - Continue Eliquis  5 mg twice daily  Diet: Normal IVF: None,None VTE: DOAC Code: Full PT/OT recs: SNF for Subacute PT TOC recs: Getting  paperwork completed for short term disability   Dispo: Anticipated discharge to Skilled nursing facility in 5 days pending placement.   Libby Blanch DO Internal Medicine Resident PGY-3 Please contact the on call pager after 5 pm and on weekends at [phone].

## 2023-12-10 NOTE — Plan of Care (Signed)
   Problem: Activity: Goal: Risk for activity intolerance will decrease Outcome: Progressing   Problem: Nutrition: Goal: Adequate nutrition will be maintained Outcome: Progressing   Problem: Coping: Goal: Level of anxiety will decrease Outcome: Progressing   Problem: Safety: Goal: Ability to remain free from injury will improve Outcome: Progressing

## 2023-12-11 DIAGNOSIS — E1165 Type 2 diabetes mellitus with hyperglycemia: Secondary | ICD-10-CM | POA: Diagnosis not present

## 2023-12-11 DIAGNOSIS — N179 Acute kidney failure, unspecified: Secondary | ICD-10-CM | POA: Diagnosis not present

## 2023-12-11 DIAGNOSIS — M86171 Other acute osteomyelitis, right ankle and foot: Secondary | ICD-10-CM | POA: Diagnosis not present

## 2023-12-11 DIAGNOSIS — E1169 Type 2 diabetes mellitus with other specified complication: Secondary | ICD-10-CM | POA: Diagnosis not present

## 2023-12-11 LAB — GLUCOSE, CAPILLARY
Glucose-Capillary: 199 mg/dL — ABNORMAL HIGH (ref 70–99)
Glucose-Capillary: 127 mg/dL — ABNORMAL HIGH (ref 70–99)
Glucose-Capillary: 161 mg/dL — ABNORMAL HIGH (ref 70–99)
Glucose-Capillary: 199 mg/dL — ABNORMAL HIGH (ref 70–99)
Glucose-Capillary: 211 mg/dL — ABNORMAL HIGH (ref 70–99)
Glucose-Capillary: 102 mg/dL — ABNORMAL HIGH (ref 70–99)
Glucose-Capillary: 94 mg/dL (ref 70–99)

## 2023-12-11 LAB — CULTURE, BLOOD (ROUTINE X 2)
Culture: NO GROWTH
Culture: NO GROWTH

## 2023-12-11 NOTE — Progress Notes (Signed)
 Physical Therapy Treatment Patient Details Name: Derek Thompson MRN: [id number] DOB: 18-Sep-1960 Today's Date: 12/11/2023   History of Present Illness Pt is a 63 y/o M presenting to ED on 11/02/23 with lethargy. Pt found to be tachycardic, hyperkalemic, and hyponatremic. 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    PT Comments  Continuing work on functional mobility and activity tolerance;  session focused on funtional transfers and gait bouts; Also assistd pt with donning shorts and shirt for going outside, which he seems to look forward to doing; Overall he is showing more stability with amb with RW; Patient will benefit from continued inpatient follow up therapy, <3 hours/day     If plan is discharge home, recommend the following: A lot of help with bathing/dressing/bathroom;Supervision due to cognitive status;Help with stairs or ramp for entrance;A lot of help with walking and/or transfers;Assistance with cooking/housework;Assist for transportation   Can travel by private vehicle     No  Equipment Recommendations  Rolling walker (2 wheels);BSC/3in1;Wheelchair (measurements PT);Wheelchair cushion (measurements PT)    Recommendations for Other Services       Precautions / Restrictions Precautions Precautions: Fall;Other (comment) Recall of Precautions/Restrictions: Impaired Precaution/Restrictions Comments: R BKA- no pillows under knee Required Braces or Orthoses: Other Brace Other Brace: R limb guard protector Restrictions RLE Weight Bearing Per Provider Order: Non weight bearing Other Position/Activity Restrictions: Educated in wearing limb guard when OOB and during sleeping to encourage R knee extension.      Mobility  Bed Mobility Overal bed mobility: Needs Assistance Bed Mobility: Supine to Sit     Supine to sit: Modified independent (Device/Increase time), Used rails, HOB elevated          Transfers Overall transfer level: Needs assistance Equipment used: Rolling walker (2 wheels) Transfers: Sit to/from Stand Sit to Stand: Min assist           General transfer comment: extra time to power up to standing, cuing re: hand placement during stand>sit but fair return demo    Ambulation/Gait Ambulation/Gait assistance: Min assist, +2 safety/equipment Gait Distance (Feet): 25 Feet (10+15) Assistive device: Rolling walker (2 wheels) Gait Pattern/deviations: Decreased step length - left, Trunk flexed       General Gait Details: Cues for RW advancement, and for saafe step length; tends to step L foot in front of wheels   Stairs             Wheelchair Mobility     Tilt Bed    Modified Rankin (Stroke Patients Only)       Balance     Sitting balance-Leahy Scale: Good       Standing balance-Leahy Scale: Poor                              Communication Communication Communication: Impaired Factors Affecting Communication: Difficulty expressing self  Cognition Arousal: Alert Behavior During Therapy: Flat affect   PT - Cognitive impairments: Awareness, Safety/Judgement                         Following commands: Intact Following commands impaired: Follows one step commands with increased time    Cueing Cueing Techniques: Verbal cues  Exercises Other Exercises Other Exercises: Bolsterd bridging x10    General Comments General comments (skin integrity, edema, etc.): Assited pt in putting on shorts and shirt for going outside later      Pertinent Vitals/Pain Pain Assessment Pain Assessment: Faces Faces Pain Scale: No hurt Pain Intervention(s): Monitored during session    Home Living                           Prior Function            PT Goals (current goals can now be found in the care plan section) Acute Rehab PT Goals Patient Stated Goal: to get to rehab PT Goal Formulation: With patient Time For Goal Achievement: 12/15/23 Potential to Achieve Goals: Good Progress towards PT goals: Progressing toward goals    Frequency    Min 2X/week      PT Plan      Co-evaluation              AM-PAC PT 6 Clicks Mobility   Outcome Measure  Help needed turning from your back to your side while in a flat bed without using bedrails?: None Help needed moving from lying on your back to sitting on the side of a flat bed without using bedrails?: A Little Help needed moving to and from a bed to a chair (including a wheelchair)?: A Little Help needed standing up from a chair using your arms (e.g., wheelchair or bedside chair)?: A Little Help needed to walk in hospital room?: A Lot Help needed climbing 3-5 steps with a railing? : Total 6 Click Score: 16    End of Session Equipment Utilized During Treatment: Gait belt Activity Tolerance: Patient tolerated treatment well;Patient limited by fatigue Patient left: in chair;with call bell/phone within reach;with chair alarm set Nurse Communication: Mobility status PT Visit Diagnosis: Unsteadiness on feet (R26.81);Difficulty in walking, not elsewhere classified (R26.2);Other abnormalities of gait and mobility (R26.89);Muscle weakness (generalized) (M62.81) Pain - Right/Left: Right Pain - part of body: Ankle and joints of foot     Time: 8880-8844 PT Time Calculation (min) (ACUTE ONLY): 36 min  Charges:    $Gait Training: 8-22 mins $Therapeutic Exercise: 8-22 mins PT General Charges $$ ACUTE PT VISIT: 1 Visit                     [name], PT  Acute Rehabilitation Services Office [phone] Secure Chat welcomed    [name] 12/11/2023, 5:02 PM

## 2023-12-11 NOTE — Plan of Care (Signed)
  Problem: Safety: Goal: Ability to remain free from injury will improve Outcome: Progressing   Problem: Coping: Goal: Ability to adjust to condition or change in health will improve Outcome: Progressing   

## 2023-12-11 NOTE — Plan of Care (Signed)
   Problem: Clinical Measurements: Goal: Will remain free from infection Outcome: Completed/Met

## 2023-12-11 NOTE — TOC Progression Note (Signed)
 Transition of Care [facility]) - Progression Note    Patient Details  Name: Derek Thompson MRN: [id number] Date of Birth: 09-02-1960  [facility]) CM/SW Contact  [name] [place], [name] Phone Number: 12/11/2023, 11:44 AM  Clinical Narrative:     Medicaid and short term disability insurance still pending. TOC will continue to follow.     Expected Discharge Plan: Skilled Nursing Facility Barriers to Discharge: Inadequate or no insurance               Expected Discharge Plan and Services In-house Referral: Clinical Social Work   Post Acute Care Choice: Skilled Nursing Facility Living arrangements for the past 2 months: Homeless Shelter                                       Social Drivers of Health (SDOH) Interventions SDOH Screenings   Food Insecurity: No Food Insecurity (11/02/2023)  Housing: High Risk (11/02/2023)  Transportation Needs: Unmet Transportation Needs (11/02/2023)  Utilities: Not At Risk (11/02/2023)  Alcohol Screen: Low Risk  (08/19/2022)  Depression (PHQ2-9): Low Risk  (10/11/2023)  Financial Resource Strain: Low Risk  (08/19/2022)  Tobacco Use: Medium Risk (11/17/2023)    Readmission Risk Interventions     No data to display

## 2023-12-11 NOTE — Progress Notes (Signed)
 HD#[id number] SUBJECTIVE:  Patient Summary: Derek Thompson is a 63 y.o. male with past medical history of HFrEF, urinary retention with chronic Foley, chronic osteomyelitis of right foot who presented with lethargy. He is now status post right BKA and now medically ready for discharge awaiting SNF placement   Overnight Events: N/A  Interim History: Patient is doing good just waiting for SNF to get a place.   OBJECTIVE:  Vital Signs: Vitals:   12/10/23 1943 12/11/23 0453 12/11/23 0500 12/11/23 0923  BP: 103/66 108/69  107/72  Pulse: 93 94  (!) 102  Resp: 16 16  19   Temp: 98.6 F (37 C) 99.4 F (37.4 C)  98.3 F (36.8 C)  TempSrc:      SpO2: 100% 99%  99%  Weight:   62.1 kg   Height:       Supplemental O2: Room Air SpO2: 99 % O2 Flow Rate (L/min): 5 L/min  Filed Weights   12/07/23 0500 12/10/23 0500 12/11/23 0500  Weight: 62.4 kg 62.2 kg 62.1 kg     Intake/Output Summary (Last 24 hours) at 12/11/2023 1723 Last data filed at 12/11/2023 1500 Gross per 24 hour  Intake 700 ml  Output 1350 ml  Net -650 ml   Net IO Since Admission: -36,571.5 mL [12/11/23 1723]  Physical Exam: Constitutional: well-appearing in bed, no acute distress Cardiovascular: Regular rate and rhythm, no murmurs, rubs, or gallops Pulmonary/Chest: normal work of breathing on room air, lungs clear to auscultation bilaterally Abdominal: soft, non-tender, non-distended MSK: Right BKA appreciated, staples removed and surgical site healing well  Patient Lines/Drains/Airways Status     Active Line/Drains/Airways     Name Placement date Placement time Site Days   Urethral Catheter Geralynn Earl, RN Coude 16 Fr. 12/08/23  1826  Coude  3   Wound 11/16/23 0826 Surgical  Sacrum Left 11/16/23  0826  Sacrum  25   Wound 11/17/23 1030 Surgical Closed Surgical Incision Knee Right 11/17/23  1030  Knee  24            Pertinent labs and imaging:      Latest Ref Rng & Units 12/10/2023    7:33 AM 12/09/2023     4:34 AM 12/08/2023    8:48 AM  CBC  WBC 4.0 - 10.5 K/uL 5.9  5.6  5.0   Hemoglobin 13.0 - 17.0 g/dL 8.3  7.8  7.0   Hematocrit 39.0 - 52.0 % 26.6  24.7  22.8   Platelets 150 - 400 K/uL 435  415  379        Latest Ref Rng & Units 12/10/2023    7:33 AM 12/09/2023    4:34 AM 12/08/2023    8:48 AM  CMP  Glucose 70 - 99 mg/dL 896  82  835   BUN 8 - 23 mg/dL 67  78  75   Creatinine 0.61 - 1.24 mg/dL 7.06  6.85  6.84   Sodium 135 - 145 mmol/L 133  133  131   Potassium 3.5 - 5.1 mmol/L 4.8  5.2  5.2   Chloride 98 - 111 mmol/L 95  94  93   CO2 22 - 32 mmol/L 27  29  26    Calcium  8.9 - 10.3 mg/dL 8.9  8.8  8.5     No results found.  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 male with past medical history of HFrEF, urinary retention with chronic Foley, chronic osteomyelitis of right foot who presented with concerns of lethargy found to have chronic osteo.  He is now status post right BKA and now medically ready for discharge awaiting SNF placement.   #Status post BKA of right foot #Chronic osteomyelitis of right foot, resolved Patient doing well postop.  No signs of further infection or complications.  He has been working with PT/OT in progressing well.  Patient is currently medically discharged and awaiting SNF  placement.  -Monitor for signs of infection - PT/OT following - Pending SNF placement   #AKI on suspected CKD #Hyponatremia #Hyperkalemia Creatinine has slowly risen over the past few days.  Most recent creatinine at 2.93.  Sodium seems to be stable.  Last potassium at 4.8.   - Monitor sodium - Monitor renal function   #Type 2 diabetes mellitus Patient is currently on NovoLog  3 units with meals plus sliding scale.  Patient has had low blood sugars with one measured down at 56,60 overnight.  Will discontinue short-acting insulin  for now. - Stop NovoLog  3 units 3 times daily with meals - Follow-up A1c outpatient   #CAUTI, resolved Most recent catheter change on 12/08/2023.  No acute concerns at this time as infection has resolved. -Continue routine catheter care   #Concern for multiple myeloma Patient had SPEP with positive M spike as well as elevated protein gap.  Bone marrow biopsy finding concerns of multiple myeloma.  Can follow-up outpatient. - Consulted oncology during hospital stay who will follow-up with patient outpatient - Monitor renal function and support electrolyte status   #HFrEF Echo 10/12/2023 showed EF of 20 to 25%.  Patient seems euvolemic on exam.  Patient is now on metoprolol  succinate 50 mg daily.  No acute concern for exacerbation at this time.  -Continue metoprolol  succinate 50 mg daily - Renal function will not allow for spironolactone , Entresto , or SGLT2 at this time   #History of LV thrombus #New subscapular splenic infarct - No acute concerns at this time - Continue Eliquis  5 mg twice daily   Diet: Normal IVF: None,None VTE: DOAC Code: Full PT/OT recs: SNF for Subacute PT TOC recs: Getting paperwork completed for short term disability    Dispo: Anticipated discharge to Skilled nursing facility in 5 days pending placement.   Signature: Rogue Pautler [facility]  5:23 PM, 12/11/2023  On Call pager [phone]

## 2023-12-11 NOTE — Progress Notes (Signed)
 Hypoglycemic Event  CBG: 56   Treatment: of Ensure   Symptoms: None  Follow-up CBG: Time:2104 CBG Result:60 Treatment: 8 oz of apple juice    Follow Up CBG: Time:2223    CBG Result: 157   Possible Reasons for Event: Inadequate meal intake     Concettina Leth R Nymir Ringler

## 2023-12-12 DIAGNOSIS — M86171 Other acute osteomyelitis, right ankle and foot: Secondary | ICD-10-CM | POA: Diagnosis not present

## 2023-12-12 DIAGNOSIS — N179 Acute kidney failure, unspecified: Secondary | ICD-10-CM | POA: Diagnosis not present

## 2023-12-12 DIAGNOSIS — E1169 Type 2 diabetes mellitus with other specified complication: Secondary | ICD-10-CM | POA: Diagnosis not present

## 2023-12-12 DIAGNOSIS — E1165 Type 2 diabetes mellitus with hyperglycemia: Secondary | ICD-10-CM | POA: Diagnosis not present

## 2023-12-12 LAB — GLUCOSE, CAPILLARY
Glucose-Capillary: 123 mg/dL — ABNORMAL HIGH (ref 70–99)
Glucose-Capillary: 194 mg/dL — ABNORMAL HIGH (ref 70–99)
Glucose-Capillary: 129 mg/dL — ABNORMAL HIGH (ref 70–99)
Glucose-Capillary: 123 mg/dL — ABNORMAL HIGH (ref 70–99)

## 2023-12-12 LAB — BASIC METABOLIC PANEL WITH GFR
Anion gap: 11 (ref 5–15)
BUN: 63 mg/dL — ABNORMAL HIGH (ref 8–23)
CO2: 28 mmol/L (ref 22–32)
Calcium: 8.5 mg/dL — ABNORMAL LOW (ref 8.9–10.3)
Chloride: 92 mmol/L — ABNORMAL LOW (ref 98–111)
Creatinine, Ser: 2.88 mg/dL — ABNORMAL HIGH (ref 0.61–1.24)
GFR, Estimated: 24 mL/min — ABNORMAL LOW (ref >60)
Glucose, Bld: 102 mg/dL — ABNORMAL HIGH (ref 70–99)
Potassium: 4.9 mmol/L (ref 3.5–5.1)
Sodium: 131 mmol/L — ABNORMAL LOW (ref 135–145)

## 2023-12-12 NOTE — Progress Notes (Signed)
 HD#39 SUBJECTIVE:  Patient Summary:  Derek Thompson is a 63 y.o. male with past medical history of HFrEF, urinary retention with chronic Foley, chronic osteomyelitis of right foot who presented with lethargy. He is now status post right BKA and now medically ready for discharge awaiting SNF placement   Overnight Events: N/A  Interim History:  Patient is doing good just waiting for SNF to get a place.   OBJECTIVE:  Vital Signs: Vitals:   12/11/23 1750 12/11/23 2115 12/12/23 0522 12/12/23 0742  BP: 108/74 106/67 109/68 104/63  Pulse: 100 99 90 95  Resp: 19 18 18 19   Temp: 98.2 F (36.8 C) 98.8 F (37.1 C) 98.7 F (37.1 C) 98.1 F (36.7 C)  TempSrc:      SpO2: 100% 100% 99% 98%  Weight:      Height:       Supplemental O2: Room Air SpO2: 98 % O2 Flow Rate (L/min): 5 L/min  Filed Weights   12/07/23 0500 12/10/23 0500 12/11/23 0500  Weight: 62.4 kg 62.2 kg 62.1 kg     Intake/Output Summary (Last 24 hours) at 12/12/2023 1155 Last data filed at 12/12/2023 0800 Gross per 24 hour  Intake 1220 ml  Output 1550 ml  Net -330 ml   Net IO Since Admission: -36,801.5 mL [12/12/23 1155]  Physical Exam: Physical Exam Constitutional: well-appearing in bed, no acute distress Cardiovascular: Regular rate and rhythm, no murmurs, rubs, or gallops Pulmonary/Chest: normal work of breathing on room air, lungs clear to auscultation bilaterally Abdominal: soft, non-tender, non-distended MSK: Right BKA appreciated, staples removed and surgical site healing well Patient Lines/Drains/Airways Status     Active Line/Drains/Airways     Name Placement date Placement time Site Days   Urethral Catheter Geralynn Earl, RN Coude 16 Fr. 12/08/23  1826  Coude  4   Wound 11/16/23 0826 Surgical  Sacrum Left 11/16/23  0826  Sacrum  26   Wound 11/17/23 1030 Surgical Closed Surgical Incision Knee Right 11/17/23  1030  Knee  25            Pertinent labs and imaging:      Latest Ref Rng &  Units 12/10/2023    7:33 AM 12/09/2023    4:34 AM 12/08/2023    8:48 AM  CBC  WBC 4.0 - 10.5 K/uL 5.9  5.6  5.0   Hemoglobin 13.0 - 17.0 g/dL 8.3  7.8  7.0   Hematocrit 39.0 - 52.0 % 26.6  24.7  22.8   Platelets 150 - 400 K/uL 435  415  379        Latest Ref Rng & Units 12/12/2023    5:05 AM 12/10/2023    7:33 AM 12/09/2023    4:34 AM  CMP  Glucose 70 - 99 mg/dL 897  896  82   BUN 8 - 23 mg/dL 63  67  78   Creatinine 0.61 - 1.24 mg/dL 7.11  7.06  6.85   Sodium 135 - 145 mmol/L 131  133  133   Potassium 3.5 - 5.1 mmol/L 4.9  4.8  5.2   Chloride 98 - 111 mmol/L 92  95  94   CO2 22 - 32 mmol/L 28  27  29    Calcium  8.9 - 10.3 mg/dL 8.5  8.9  8.8     No results found.  ASSESSMENT/PLAN:  Assessment: Principal Problem:   Osteomyelitis of right foot (HCC) Active Problems:   CAD (coronary artery disease)   Hyperkalemia  AKI (acute kidney injury) (HCC)   Anemia   LV (left ventricular) mural thrombus without MI (HCC)   Gangrene of right foot (HCC)   Hyponatremia   HFrEF (heart failure with reduced ejection fraction) (HCC)   Chronic osteomyelitis involving lower leg, right (HCC)   Frailty   Urinary retention   Generalized weakness   Sinus tachycardia   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 (HCC)   Chronic anemia   Hypoalbuminemia   Monoclonal gammopathy   Hx of right BKA (HCC)   Moderate protein-calorie malnutrition (HCC)   Plan: Derek Thompson is a 63 y.o. male with past medical history of HFrEF, urinary retention with chronic Foley, chronic osteomyelitis of right foot who presented with concerns of lethargy found to have chronic osteo.  He is now status post right BKA and now medically ready for discharge awaiting SNF placement.   #Status post BKA of right foot #Chronic osteomyelitis of right foot, resolved Patient doing well postop.  No signs of further infection or complications.  He has been working with PT/OT in progressing well.  Patient is  currently medically discharged and awaiting SNF placement.  -Monitor for signs of infection - PT/OT following - Pending SNF placement   #AKI on suspected CKD #Hyponatremia #Hyperkalemia Creatinine has slowly risen over the past few days.  Most recent creatinine at 2.88.  Sodium seems to be stable.  Last potassium at 4.9.   - Monitor sodium - Monitor renal function   #Type 2 diabetes mellitus Last BG was 102, no hypoglycemic even - Continue holding NovoLog   - Follow-up A1c outpatient   #CAUTI, resolved Most recent catheter change on 12/08/2023.  No acute concerns at this time as infection has resolved. -Continue routine catheter care   #Concern for multiple myeloma Patient had SPEP with positive M spike as well as elevated protein gap.  Bone marrow biopsy finding concerns of multiple myeloma.  Can follow-up outpatient. - Consulted oncology during hospital stay who will follow-up with patient outpatient - Monitor renal function and support electrolyte status   #HFrEF Echo 10/12/2023 showed EF of 20 to 25%.  Patient seems euvolemic on exam.  Patient is now on metoprolol  succinate 50 mg daily.  No acute concern for exacerbation at this time.  -Continue metoprolol  succinate 50 mg daily - Renal function will not allow for spironolactone , Entresto , or SGLT2 at this time   #History of LV thrombus #New subscapular splenic infarct - No acute concerns at this time - Continue Eliquis  5 mg twice daily   Diet: Normal IVF: None,None VTE: Eliquise Code: Full PT/OT recs: SNF for Subacute PT TOC recs: Getting paperwork completed for short term disability    Dispo: Anticipated discharge to Skilled nursing facility in 5 days pending placement.  Signature:  Gaurav Baldree [facility]  11:55 AM, 12/12/2023  On Call pager [phone]

## 2023-12-12 NOTE — Progress Notes (Signed)
 Occupational Therapy Treatment Patient Details Name: Derek Thompson MRN: [id number] DOB: July 01, 1960 Today's Date: 12/12/2023   History of present illness Pt is a 63 y/o M presenting to ED on 11/02/23 with lethargy. Pt found to be tachycardic, hyperkalemic, and hyponatremic. 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   OT comments  Pt resting in bed comfortably, agreeable to session, fatigues quickly. Pt mod I for bed mobility, increased time/effort but no physical assist. When attempted to prepare to stand Pt verbalized need for limb protector, showing fair safety awareness for OOB activities. Pt able to don/doff as needed, was not able to fully extend RLE, instructed Pt to keep R knee in extended position and complete exercises/stretches to reduce risk of contracture. Pt stood with CGA/min A x2 at EOB, too fatigued to continue, lied back down. Pt instructed on BUE exercsies with green theraband, given handout, Pt with good follow through of exercises.  Pt would benefit from continued acute OT to maximize activity tolerance, DC to postacute rehab <3hrs/day still recommended.       If plan is discharge home, recommend the following:  A little help with walking and/or transfers;A little help with bathing/dressing/bathroom;Assistance with cooking/housework;Direct supervision/assist for medications management;Direct supervision/assist for financial management;Assist for transportation;Help with stairs or ramp for entrance   Equipment Recommendations  Other (comment) (defer)    Recommendations for Other Services      Precautions / Restrictions Precautions Precautions: Fall;Other (comment) Recall of Precautions/Restrictions:  Impaired Precaution/Restrictions Comments: R BKA- no pillows under knee Required Braces or Orthoses: Other Brace Other Brace: R limb guard protector Restrictions Weight Bearing Restrictions Per Provider Order: Yes RLE Weight Bearing Per Provider Order: Non weight bearing       Mobility Bed Mobility Overal bed mobility: Modified Independent                  Transfers Overall transfer level: Needs assistance Equipment used: Rolling walker (2 wheels) Transfers: Sit to/from Stand, Bed to chair/wheelchair/BSC Sit to Stand: Contact guard assist, Min assist Stand pivot transfers: Contact guard assist         General transfer comment: CGA with RW     Balance Overall balance assessment: Needs assistance Sitting-balance support: No upper extremity supported, Feet supported Sitting balance-Leahy Scale: Good     Standing balance support: Bilateral upper extremity supported, Reliant on assistive device for balance, During functional activity Standing balance-Leahy Scale: Poor Standing balance comment: reliant on RW for support                           ADL either performed or assessed with clinical judgement   ADL Overall ADL's : Needs assistance/impaired                     Lower Body Dressing: Set up;Sitting/lateral leans   Toilet Transfer: Contact guard assist;Rolling walker (2 wheels);BSC/3in1             General ADL Comments: doing well with stand pivot transfer, able to don/doff limb protector well    Extremity/Trunk Assessment Upper Extremity Assessment Upper Extremity Assessment: Generalized weakness            Vision       Perception  Praxis     Communication Communication Communication: Impaired Factors Affecting Communication: Difficulty expressing self   Cognition Arousal: Alert Behavior During Therapy: Flat affect Cognition: Cognition impaired   Orientation impairments: Time Awareness: Intellectual awareness  intact, Online awareness impaired       OT - Cognition Comments: Pt with decreased awareness of time, poor insight to impairments                 Following commands: Intact Following commands impaired: Follows one step commands with increased time      Cueing   Cueing Techniques: Verbal cues  Exercises Other Exercises Other Exercises: BUE theraband exercises, HEP given    Shoulder Instructions       General Comments      Pertinent Vitals/ Pain       Pain Assessment Pain Assessment: No/denies pain  Home Living                                          Prior Functioning/Environment              Frequency  Min 2X/week        Progress Toward Goals  OT Goals(current goals can now be found in the care plan section)  Progress towards OT goals: Progressing toward goals  Acute Rehab OT Goals Patient Stated Goal: to improve activity tolerance OT Goal Formulation: With patient Time For Goal Achievement: 12/18/23 Potential to Achieve Goals: Fair ADL Goals Pt Will Perform Grooming: with supervision;standing Pt Will Perform Lower Body Bathing: with set-up;sitting/lateral leans Pt Will Perform Upper Body Dressing: with set-up;sitting Pt Will Perform Lower Body Dressing: with set-up;sitting/lateral leans Pt Will Transfer to Toilet: with supervision;ambulating;bedside commode Pt Will Perform Toileting - Clothing Manipulation and hygiene: with set-up;sitting/lateral leans Pt/caregiver will Perform Home Exercise Program: Increased strength;Both right and left upper extremity;With theraband;With theraputty;Independently;With written HEP provided Additional ADL Goal #1: Pt will be able to follow 1 step commands with >50% accuracy to maximize participation in ADLs  Plan      Co-evaluation                 AM-PAC OT 6 Clicks Daily Activity     Outcome Measure   Help from another person eating meals?: None Help from another person taking  care of personal grooming?: A Little Help from another person toileting, which includes using toliet, bedpan, or urinal?: A Little Help from another person bathing (including washing, rinsing, drying)?: A Little Help from another person to put on and taking off regular upper body clothing?: A Little Help from another person to put on and taking off regular lower body clothing?: A Little 6 Click Score: 19    End of Session Equipment Utilized During Treatment: Gait belt;Rolling walker (2 wheels)  OT Visit Diagnosis: Unsteadiness on feet (R26.81);Other abnormalities of gait and mobility (R26.89);Muscle weakness (generalized) (M62.81);Other symptoms and signs involving cognitive function Pain - Right/Left: Right Pain - part of body: Leg   Activity Tolerance Patient limited by fatigue   Patient Left in bed;with call bell/phone within reach   Nurse Communication Mobility status        Time: 1450-1506 OT Time Calculation (min): 16 min  Charges: OT General Charges $OT Visit: 1 Visit OT Treatments $Therapeutic Activity: 8-22 mins  [place], OTR/L   [name] 12/12/2023, 3:16 PM

## 2023-12-12 NOTE — Plan of Care (Signed)
  Problem: Clinical Measurements: Goal: Ability to maintain clinical measurements within normal limits will improve Outcome: Progressing   Problem: Nutrition: Goal: Adequate nutrition will be maintained Outcome: Progressing   Problem: Coping: Goal: Level of anxiety will decrease Outcome: Progressing   

## 2023-12-13 LAB — GLUCOSE, CAPILLARY
Glucose-Capillary: 146 mg/dL — ABNORMAL HIGH (ref 70–99)
Glucose-Capillary: 141 mg/dL — ABNORMAL HIGH (ref 70–99)
Glucose-Capillary: 159 mg/dL — ABNORMAL HIGH (ref 70–99)
Glucose-Capillary: 147 mg/dL — ABNORMAL HIGH (ref 70–99)

## 2023-12-13 NOTE — TOC Progression Note (Addendum)
 Transition of Care [facility]) - Progression Note    Patient Details  Name: Derek Thompson MRN: [id number] Date of Birth: Mar 05, 1961  Transition of Care [facility]) CM/SW Contact  Derek Thompson, [name] Phone Number: 12/13/2023, 8:56 AM  Clinical Narrative:     Pt's union rep. Reported that [employer] approved pt's short term disability. When CSW inquired about details she directed CSW to [employer].   CSW accessed pt's medical benefits eligibility in [employer] portal and it still shows that pt's eligibility ended on 08/27/23 and that pt is not currently eligible for medical benefits.   CSW called [employer] though they are closed currently. Will try back later this morning.   1000: CSW called [employer]. They explained they are unable to discuss pt's disability status and that they can only tell CSW that he is not currently eligible for medical benefits.   CSW emailed pt's Union Rep, [name], and requested that she obtain details of pt's medical benefits eligibility.   1400: Spoke with [name] who states she received notification of pt's STD approval from [place] who works for [name] [phone]   CSW called [name] and left voicemail requesting return call.   1500: CSW spoke with [name] over the phone. She confirmed that pt was approved for short term disability and should have medical benefits. She provides number for [facility] other inquiries [phone] CSW notified [name] rep. And requested they try to verify benefits and initiate SNF auth.   Expected Discharge Plan: Skilled Nursing Facility Barriers to Discharge: Inadequate or no insurance               Expected Discharge Plan and Services In-house Referral: Clinical Social Work   Post Acute Care Choice: Skilled Nursing Facility Living arrangements for the past 2 months: Homeless Shelter                                       Social Drivers of Health (SDOH)  Interventions SDOH Screenings   Food Insecurity: No Food Insecurity (11/02/2023)  Housing: High Risk (11/02/2023)  Transportation Needs: Unmet Transportation Needs (11/02/2023)  Utilities: Not At Risk (11/02/2023)  Alcohol Screen: Low Risk  (08/19/2022)  Depression (PHQ2-9): Low Risk  (10/11/2023)  Financial Resource Strain: Low Risk  (08/19/2022)  Tobacco Use: Medium Risk (11/17/2023)    Readmission Risk Interventions     No data to display

## 2023-12-13 NOTE — Progress Notes (Signed)
 HD#40 SUBJECTIVE:  Patient Summary: Derek Thompson is a 63 y.o. male with past medical history of HFrEF, urinary retention with chronic Foley, chronic osteomyelitis of right foot who presented with concerns of lethargy found to have chronic osteo. He is now status post right BKA and now medically ready for discharge awaiting SNF placement.   Overnight Events: N/A  Interim History: Patient is doing good just waiting for SNF to get a place.   OBJECTIVE:  Vital Signs: Vitals:   12/12/23 1711 12/12/23 2006 12/13/23 0533 12/13/23 0822  BP: 102/64 (!) 95/55 112/68 104/62  Pulse: 89 92 94 94  Resp: 19 18 18 18   Temp: 98.4 F (36.9 C) 98.1 F (36.7 C) 98.9 F (37.2 C) 98.6 F (37 C)  TempSrc:      SpO2: 100% 100% 95% 99%  Weight:      Height:       Supplemental O2: Room Air SpO2: 99 % O2 Flow Rate (L/min): 5 L/min  Filed Weights   12/07/23 0500 12/10/23 0500 12/11/23 0500  Weight: 62.4 kg 62.2 kg 62.1 kg     Intake/Output Summary (Last 24 hours) at 12/13/2023 1605 Last data filed at 12/13/2023 1300 Gross per 24 hour  Intake 1460 ml  Output 700 ml  Net 760 ml   Net IO Since Admission: -36,541.5 mL [12/13/23 1605]  Physical Exam: Physical Exam     HD#39 SUBJECTIVE:  Patient Summary:  Derek Thompson is a 63 y.o. male with past medical history of HFrEF, urinary retention with chronic Foley, chronic osteomyelitis of right foot who presented with lethargy. He is now status post right BKA and now medically ready for discharge awaiting SNF placement    Overnight Events: N/A   Interim History:  Patient is doing good just waiting for SNF to get a place.    OBJECTIVE:  Vital Signs:       Vitals:    12/11/23 1750 12/11/23 2115 12/12/23 0522 12/12/23 0742  BP: 108/74 106/67 109/68 104/63  Pulse: 100 99 90 95  Resp: 19 18 18 19   Temp: 98.2 F (36.8 C) 98.8 F (37.1 C) 98.7 F (37.1 C) 98.1 F (36.7 C)  TempSrc:          SpO2: 100% 100% 99% 98%  Weight:           Height:            Supplemental O2: Room Air SpO2: 98 % O2 Flow Rate (L/min): 5 L/min        Filed Weights    12/07/23 0500 12/10/23 0500 12/11/23 0500  Weight: 62.4 kg 62.2 kg 62.1 kg        Intake/Output Summary (Last 24 hours) at 12/12/2023 1155 Last data filed at 12/12/2023 0800    Gross per 24 hour  Intake 1220 ml  Output 1550 ml  Net -330 ml    Net IO Since Admission: -36,801.5 mL [12/12/23 1155]   Physical Exam: Physical Exam Constitutional: well-appearing in bed, no acute distress Cardiovascular: Regular rate and rhythm, no murmurs, rubs, or gallops Pulmonary/Chest: normal work of breathing on room air, lungs clear to auscultation bilaterally Abdominal: soft, non-tender, non-distended MSK: Right BKA appreciated, staples removed and surgical site healing well   Patient Lines/Drains/Airways Status     Active Line/Drains/Airways     Name Placement date Placement time Site Days   Urethral Catheter Geralynn Earl, RN Coude 16 Fr. 12/08/23  1826  Coude  5   Wound 11/16/23  9173 Surgical  Sacrum Left 11/16/23  0826  Sacrum  27   Wound 11/17/23 1030 Surgical Closed Surgical Incision Knee Right 11/17/23  1030  Knee  26            Pertinent labs and imaging:      Latest Ref Rng & Units 12/10/2023    7:33 AM 12/09/2023    4:34 AM 12/08/2023    8:48 AM  CBC  WBC 4.0 - 10.5 K/uL 5.9  5.6  5.0   Hemoglobin 13.0 - 17.0 g/dL 8.3  7.8  7.0   Hematocrit 39.0 - 52.0 % 26.6  24.7  22.8   Platelets 150 - 400 K/uL 435  415  379        Latest Ref Rng & Units 12/12/2023    5:05 AM 12/10/2023    7:33 AM 12/09/2023    4:34 AM  CMP  Glucose 70 - 99 mg/dL 897  896  82   BUN 8 - 23 mg/dL 63  67  78   Creatinine 0.61 - 1.24 mg/dL 7.11  7.06  6.85   Sodium 135 - 145 mmol/L 131  133  133   Potassium 3.5 - 5.1 mmol/L 4.9  4.8  5.2   Chloride 98 - 111 mmol/L 92  95  94   CO2 22 - 32 mmol/L 28  27  29    Calcium  8.9 - 10.3 mg/dL 8.5  8.9  8.8     No results  found.  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 male with past medical history of HFrEF, urinary retention with chronic Foley, chronic osteomyelitis of right foot who presented with concerns of lethargy found to have chronic osteo.  He is now status post right BKA and now medically ready for discharge awaiting SNF placement.   #Status post BKA of right foot #Chronic osteomyelitis of right foot, resolved Patient doing well postop.  No signs of further infection or complications.  He has been working with PT/OT in progressing well.  Patient is currently medically discharged and awaiting SNF placement.  -Monitor for signs of infection - PT/OT following - Pending SNF placement   #AKI on suspected CKD #Hyponatremia #Hyperkalemia Creatinine has slowly risen over the past few days.  Most recent creatinine at 2.88.  Sodium seems to be stable.  Last potassium at 4.9.   - Monitor sodium - Monitor renal function   #Type 2 diabetes mellitus Last BG was 102, no hypoglycemic even - Continue holding NovoLog   - Follow-up A1c outpatient   #CAUTI, resolved Most recent catheter change on 12/08/2023.  No acute concerns at this time as infection has resolved. -Continue routine catheter care   #Concern for multiple  myeloma Patient had SPEP with positive M spike  as well as elevated protein gap.  Bone marrow biopsy finding concerns of multiple myeloma.  Can follow-up outpatient. - Consulted oncology during hospital stay who will follow-up with patient outpatient - Monitor renal function and support electrolyte status   #HFrEF Echo 10/12/2023 showed EF of 20 to 25%.  Patient seems euvolemic on exam.  Patient is now on metoprolol  succinate 50 mg daily.  No acute concern for exacerbation at this time.  -Continue metoprolol  succinate 50 mg daily - Renal function will not allow for spironolactone , Entresto , or SGLT2 at this time   #History of LV thrombus #New subscapular splenic infarct - No acute concerns at this time - Continue Eliquis  5 mg twice daily   Diet: Normal IVF: None,None VTE: Eliquise Code: Full PT/OT recs: SNF for Subacute PT TOC recs: Getting paperwork completed for short term disability    Dispo: Anticipated discharge to Skilled nursing facility in 5 days pending placement.   Tiwanna Tuch Bernadine Jolynn [facility]  4:05 PM, 12/13/2023  On Call pager [phone]

## 2023-12-13 NOTE — Progress Notes (Signed)
 Physical Therapy Treatment Patient Details Name: Derek Thompson MRN: [id number] DOB: [birth year] Today's Date: 12/13/2023   History of Present Illness Pt is a 63 y/o M presenting to ED on 11/02/23 with lethargy. Pt found to be tachycardic, hyperkalemic, and hyponatremic. 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    PT Comments  Continuing work on functional mobility and activity tolerance;  Session focused on exercise program for hip/knee flexor stretching and hip/knee extensor strengthening; Performed well, and noteworthy that he was able to get into and tolerate prone positioning for hip extension long duration stretching; decr gluteal strength and muscle endurance with hip extension against gravity; Provided pt with written HEP packet and Medbridge QR code; he seemed in good spirits to get outside today    If plan is discharge home, recommend the following: A lot of help with bathing/dressing/bathroom;Supervision due to cognitive status;Help with stairs or ramp for entrance;A lot of help with walking and/or transfers;Assistance with cooking/housework;Assist for transportation   Can travel by private vehicle     No  Equipment Recommendations  Rolling walker (2 wheels);BSC/3in1;Wheelchair (measurements PT);Wheelchair cushion (measurements PT)    Recommendations for Other Services       Precautions / Restrictions Precautions Precautions: Fall;Other (comment) Recall of Precautions/Restrictions: Impaired Precaution/Restrictions Comments: R BKA- no pillows under knee Required Braces or Orthoses: Other Brace Other Brace: R limb guard protector Restrictions RLE Weight Bearing Per Provider Order: Non weight bearing Other  Position/Activity Restrictions: Educated in wearing limb guard when OOB and during sleeping to encourage R knee extension.     Mobility  Bed Mobility Overal bed mobility: Modified Independent, Needs Assistance Bed Mobility: Rolling Rolling: Modified independent (Device/Increase time), Supervision         General bed mobility comments: Rolled supine to sidelying (for R LE sidelying kick backs), then rolled to prone for hip flexor stretch and hip extension against gravity                                                                            Balance                                            Communication    Cognition Arousal: Alert Behavior During Therapy: Flat affect                             Following commands: Intact      Cueing Cueing Techniques: Verbal cues  Exercises Amputee Exercises Quad Sets: AROM, Right, 10 reps, Supine Hip Extension: Right, 20 reps, Prone, Sidelying, Other (comment) (10 reps prone hip extension and 10 reps sidelying hip extension) Other Exercises Other Exercises: Bolsterd bridging x10 Other Exercises: supine hamstring self-stretch, stabilizing distal femur with hip flexed and extending knee; 10 reps 5 second hold    General Comments General  comments (skin integrity, edema, etc.): Provided pt with Medbridge HEP      Pertinent Vitals/Pain Pain Assessment Pain Assessment: No/denies pain Pain Intervention(s): Monitored during session    Home Living                          Prior Function            PT Goals (current goals can now be found in the care plan section) Acute Rehab PT Goals Patient Stated Goal: to get to rehab PT Goal Formulation: With patient Time For Goal Achievement: 12/15/23 Potential to Achieve Goals: Good Progress towards PT goals: Progressing toward goals    Frequency    Min 2X/week      PT Plan      Co-evaluation               AM-PAC PT 6 Clicks Mobility   Outcome Measure  Help needed turning from your back to your side while in a flat bed without using bedrails?: None Help needed moving from lying on your back to sitting on the side of a flat bed without using bedrails?: None Help needed moving to and from a bed to a chair (including a wheelchair)?: A Little Help needed standing up from a chair using your arms (e.g., wheelchair or bedside chair)?: A Little Help needed to walk in hospital room?: A Lot Help needed climbing 3-5 steps with a railing? : Total 6 Click Score: 17    End of Session   Activity Tolerance: Patient tolerated treatment well;Patient limited by fatigue Patient left: in bed;with call bell/phone within reach;Other (comment) (continuing prone-lying stretch) Nurse Communication: Mobility status PT Visit Diagnosis: Unsteadiness on feet (R26.81);Difficulty in walking, not elsewhere classified (R26.2);Other abnormalities of gait and mobility (R26.89);Muscle weakness (generalized) (M62.81) Pain - Right/Left: Right Pain - part of body: Ankle and joints of foot     Time: 1427-1450 PT Time Calculation (min) (ACUTE ONLY): 23 min  Charges:    $Therapeutic Exercise: 23-37 mins PT General Charges $$ ACUTE PT VISIT: 1 Visit                     [name], PT  Acute Rehabilitation Services Office [phone] Secure Chat welcomed    [name] 12/13/2023, 3:38 PM

## 2023-12-14 DIAGNOSIS — E1169 Type 2 diabetes mellitus with other specified complication: Secondary | ICD-10-CM | POA: Diagnosis not present

## 2023-12-14 DIAGNOSIS — M86171 Other acute osteomyelitis, right ankle and foot: Secondary | ICD-10-CM | POA: Diagnosis not present

## 2023-12-14 DIAGNOSIS — E1165 Type 2 diabetes mellitus with hyperglycemia: Secondary | ICD-10-CM | POA: Diagnosis not present

## 2023-12-14 DIAGNOSIS — N179 Acute kidney failure, unspecified: Secondary | ICD-10-CM | POA: Diagnosis not present

## 2023-12-14 LAB — CBC
HCT: 27.6 % — ABNORMAL LOW (ref 39.0–52.0)
Hemoglobin: 8.5 g/dL — ABNORMAL LOW (ref 13.0–17.0)
MCH: 26.8 pg (ref 26.0–34.0)
MCHC: 30.8 g/dL (ref 30.0–36.0)
MCV: 87.1 fL (ref 80.0–100.0)
Platelets: 467 K/uL — ABNORMAL HIGH (ref 150–400)
RBC: 3.17 MIL/uL — ABNORMAL LOW (ref 4.22–5.81)
RDW: 17.6 % — ABNORMAL HIGH (ref 11.5–15.5)
WBC: 6.3 K/uL (ref 4.0–10.5)
nRBC: 0 % (ref 0.0–0.2)

## 2023-12-14 LAB — BASIC METABOLIC PANEL WITH GFR
Anion gap: 12 (ref 5–15)
BUN: 63 mg/dL — ABNORMAL HIGH (ref 8–23)
CO2: 25 mmol/L (ref 22–32)
Calcium: 8.6 mg/dL — ABNORMAL LOW (ref 8.9–10.3)
Chloride: 95 mmol/L — ABNORMAL LOW (ref 98–111)
Creatinine, Ser: 3.02 mg/dL — ABNORMAL HIGH (ref 0.61–1.24)
GFR, Estimated: 22 mL/min — ABNORMAL LOW (ref >60)
Glucose, Bld: 271 mg/dL — ABNORMAL HIGH (ref 70–99)
Potassium: 4.8 mmol/L (ref 3.5–5.1)
Sodium: 132 mmol/L — ABNORMAL LOW (ref 135–145)

## 2023-12-14 LAB — GLUCOSE, CAPILLARY
Glucose-Capillary: 266 mg/dL — ABNORMAL HIGH (ref 70–99)
Glucose-Capillary: 168 mg/dL — ABNORMAL HIGH (ref 70–99)
Glucose-Capillary: 139 mg/dL — ABNORMAL HIGH (ref 70–99)
Glucose-Capillary: 157 mg/dL — ABNORMAL HIGH (ref 70–99)

## 2023-12-14 MED ORDER — LACTATED RINGERS IV SOLN: INTRAVENOUS | Status: AC

## 2023-12-14 NOTE — Progress Notes (Signed)
 Occupational Therapy Treatment Patient Details Name: Courage Biglow MRN: [id number] DOB: August 20, 1960 Today's Date: 12/14/2023   History of present illness Pt is a 63 y/o M presenting to ED on 11/02/23 with lethargy. Pt found to be tachycardic, hyperkalemic, and hyponatremic. 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   OT comments  Pt eager to participate, increased time at beginning of session due to just waking up from nap. Able to don RLE limb protector with set up, set up for UB/LB dressing sitting at EOB. Pt needs assistance for perineal hygiene standing with RW for support, B hands on RW required to maintain balance. Pt transfers to w/c with CGA using RW, able to control sitting safely to w/c. Pt propels w/c roughly 15ft X2 needing rest breaks, tires quickly. Pt left with NT in w/c, planning to go outside for sunlight and fresh air. Pt would benefit from continued acute OT to maximize functional independence with goes of being mod I transfer to w/c and ADLs. DC plan remains appropriate, if continues to improve and participates consistently with therapy and attains mod I level, hopeful to DC with w/c and RW.       If plan is discharge home, recommend the following:  A little help with walking and/or transfers;A little help with bathing/dressing/bathroom;Assistance with cooking/housework;Direct supervision/assist for medications management;Direct supervision/assist for financial management;Assist for transportation;Help with stairs or ramp for entrance   Equipment Recommendations  Other (comment)    Recommendations for Other Services      Precautions / Restrictions Precautions Precautions: Fall;Other (comment) Recall of  Precautions/Restrictions: Impaired Precaution/Restrictions Comments: R BKA- no pillows under knee Required Braces or Orthoses: Other Brace Other Brace: R limb guard protector Restrictions Weight Bearing Restrictions Per Provider Order: Yes RLE Weight Bearing Per Provider Order: Non weight bearing       Mobility Bed Mobility Overal bed mobility: Modified Independent                  Transfers Overall transfer level: Needs assistance Equipment used: Rolling walker (2 wheels) Transfers: Sit to/from Stand, Bed to chair/wheelchair/BSC Sit to Stand: Contact guard assist     Step pivot transfers: Contact guard assist     General transfer comment: CGA for STS and transfer to w/c using RW     Balance Overall balance assessment: Needs assistance Sitting-balance support: No upper extremity supported, Feet supported Sitting balance-Leahy Scale: Good     Standing balance support: Bilateral upper extremity supported, During functional activity, Reliant on assistive device for balance Standing balance-Leahy Scale: Poor Standing balance comment: reliant on RW to maintain balance                           ADL either performed or assessed with clinical judgement   ADL Overall ADL's : Needs assistance/impaired                 Upper Body Dressing : Set up;Sitting   Lower Body Dressing: Set up;Sitting/lateral leans   Toilet Transfer: Contact guard assist;Rolling walker (2 wheels);BSC/3in1   Toileting- Clothing Manipulation and Hygiene: Maximal assistance;Sit to/from stand         General ADL Comments: Pt needs assistance for perineal hygiene standing at bedside, B  hands on RW to maintain balance. Pt completes UB/LB dressing with increased time    Extremity/Trunk Assessment Upper Extremity Assessment Upper Extremity Assessment: Generalized weakness            Vision       Perception     Praxis     Communication Communication Communication: No  apparent difficulties   Cognition Arousal: Alert Behavior During Therapy: Flat affect Cognition: Cognition impaired   Orientation impairments: Time         OT - Cognition Comments: Pt grossly oriented, not oriented to time, appears to have some STM deficits                 Following commands: Intact        Cueing   Cueing Techniques: Verbal cues  Exercises      Shoulder Instructions       General Comments      Pertinent Vitals/ Pain       Pain Assessment Pain Assessment: No/denies pain  Home Living                                          Prior Functioning/Environment              Frequency  Min 2X/week        Progress Toward Goals  OT Goals(current goals can now be found in the care plan section)  Progress towards OT goals: Progressing toward goals  Acute Rehab OT Goals Patient Stated Goal: to improve activity tolerance OT Goal Formulation: With patient Time For Goal Achievement: 12/18/23 Potential to Achieve Goals: Fair ADL Goals Pt Will Perform Grooming: with supervision;standing Pt Will Perform Lower Body Bathing: with set-up;sitting/lateral leans Pt Will Perform Upper Body Dressing: with set-up;sitting Pt Will Perform Lower Body Dressing: with set-up;sitting/lateral leans Pt Will Transfer to Toilet: with supervision;ambulating;bedside commode Pt Will Perform Toileting - Clothing Manipulation and hygiene: with set-up;sitting/lateral leans Pt/caregiver will Perform Home Exercise Program: Increased strength;Both right and left upper extremity;With theraband;With theraputty;Independently;With written HEP provided Additional ADL Goal #1: Pt will be able to follow 1 step commands with >50% accuracy to maximize participation in ADLs  Plan      Co-evaluation                 AM-PAC OT 6 Clicks Daily Activity     Outcome Measure   Help from another person eating meals?: None Help from another person taking care  of personal grooming?: A Little Help from another person toileting, which includes using toliet, bedpan, or urinal?: A Little Help from another person bathing (including washing, rinsing, drying)?: A Little Help from another person to put on and taking off regular upper body clothing?: A Little Help from another person to put on and taking off regular lower body clothing?: A Little 6 Click Score: 19    End of Session Equipment Utilized During Treatment: Gait belt;Rolling walker (2 wheels)  OT Visit Diagnosis: Unsteadiness on feet (R26.81);Other abnormalities of gait and mobility (R26.89);Muscle weakness (generalized) (M62.81);Other symptoms and signs involving cognitive function Pain - Right/Left: Right Pain - part of body: Leg   Activity Tolerance Patient tolerated treatment well   Patient Left Other (comment) (with NT in w/c, going outside)   Nurse Communication Mobility status        Time: 8477-8449 OT Time Calculation (min): 28 min  Charges: OT General Charges $  OT Visit: 1 Visit OT Treatments $Self Care/Home Management : 8-22 mins $Therapeutic Activity: 8-22 mins  Caretha Rumbaugh, OTR/L   [name] 12/14/2023, 3:57 PM

## 2023-12-14 NOTE — Plan of Care (Signed)
   Problem: Clinical Measurements: Goal: Ability to maintain clinical measurements within normal limits will improve Outcome: Progressing   Problem: Activity: Goal: Risk for activity intolerance will decrease Outcome: Progressing

## 2023-12-14 NOTE — Progress Notes (Signed)
 HD#41 SUBJECTIVE:  Patient Summary: Derek Thompson is a 63 y.o. male with past medical history of HFrEF, urinary retention with chronic Foley, chronic osteomyelitis of righ   Overnight Events: N/A  Interim History: Patient is doing good just waiting for SNF to get a place.   OBJECTIVE:  Vital Signs: Vitals:   12/13/23 1941 12/14/23 0445 12/14/23 0500 12/14/23 0810  BP: 117/70 122/69  101/66  Pulse: 95   (!) 102  Resp: 18 18  17   Temp: 98.8 F (37.1 C) 98.1 F (36.7 C)  98.4 F (36.9 C)  TempSrc:      SpO2: 97% 98%  96%  Weight:   61.7 kg   Height:       Supplemental O2: Room Air SpO2: 96 % O2 Flow Rate (L/min): 5 L/min  Filed Weights   12/10/23 0500 12/11/23 0500 12/14/23 0500  Weight: 62.2 kg 62.1 kg 61.7 kg     Intake/Output Summary (Last 24 hours) at 12/14/2023 1345 Last data filed at 12/14/2023 0700 Gross per 24 hour  Intake 400 ml  Output 400 ml  Net 0 ml   Net IO Since Admission: -36,541.5 mL [12/14/23 1345]  Physical Exam: Physical Exam Constitutional: well-appearing in bed, no acute distress Cardiovascular: Regular rate and rhythm, no murmurs, rubs, or gallops Pulmonary/Chest: normal work of breathing on room air, lungs clear to auscultation bilaterally Abdominal: soft, non-tender, non-distended MSK: Right BKA appreciated, staples removed and surgical site healing well Patient Lines/Drains/Airways Status     Active Line/Drains/Airways     Name Placement date Placement time Site Days   Peripheral IV 12/14/23 22 G 1 Left;Lateral;Posterior Forearm 12/14/23  0747  Forearm  less than 1   Urethral Catheter Mekides Nida, RN Coude 16 Fr. 12/08/23  1826  Coude  6   Wound 11/16/23 0826 Surgical  Sacrum Left 11/16/23  0826  Sacrum  28   Wound 11/17/23 1030 Surgical Closed Surgical Incision Knee Right 11/17/23  1030  Knee  27            Pertinent labs and imaging:      Latest Ref Rng & Units 12/14/2023    4:34 AM 12/10/2023    7:33 AM 12/09/2023     4:34 AM  CBC  WBC 4.0 - 10.5 K/uL 6.3  5.9  5.6   Hemoglobin 13.0 - 17.0 g/dL 8.5  8.3  7.8   Hematocrit 39.0 - 52.0 % 27.6  26.6  24.7   Platelets 150 - 400 K/uL 467  435  415        Latest Ref Rng & Units 12/14/2023    4:34 AM 12/12/2023    5:05 AM 12/10/2023    7:33 AM  CMP  Glucose 70 - 99 mg/dL 728  897  896   BUN 8 - 23 mg/dL 63  63  67   Creatinine 0.61 - 1.24 mg/dL 6.97  7.11  7.06   Sodium 135 - 145 mmol/L 132  131  133   Potassium 3.5 - 5.1 mmol/L 4.8  4.9  4.8   Chloride 98 - 111 mmol/L 95  92  95   CO2 22 - 32 mmol/L 25  28  27    Calcium  8.9 - 10.3 mg/dL 8.6  8.5  8.9     No results found.  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 male with past medical history of HFrEF, urinary retention with chronic Foley, chronic osteomyelitis of right foot who presented with concerns of lethargy found to have chronic osteo.  He is now status post right BKA and now medically ready for discharge awaiting SNF placement. (Short Term Disability approved)   #Status post BKA of right foot #Chronic osteomyelitis of right foot, resolved Patient doing well postop.  No signs of further infection or complications.  He has been working with PT/OT in progressing well.  Patient is currently medically  discharged and awaiting SNF placement.  -Monitor for signs of infection - PT/OT following - Pending SNF placement   #AKI on suspected CKD #Hyponatremia #Hyperkalemia Creatinine has slowly risen over the past few days.  Most recent creatinine at 3.02. Sodium seems to be stable.  Last potassium at 4.8.   -LR 50 cc/h for 10 hours - Monitor sodium - Monitor renal function   #Type 2 diabetes mellitus Last BG was 102, no hypoglycemic even - Continue holding NovoLog   - Follow-up A1c outpatient   #CAUTI, resolved Most recent catheter change on 12/08/2023.  No acute concerns at this time as infection has resolved. -Continue routine catheter care   #Concern for multiple myeloma Patient had SPEP with positive M spike as well as elevated protein gap.  Bone marrow biopsy finding concerns of multiple myeloma.  Can follow-up outpatient. - Consulted oncology during hospital stay who will follow-up with patient outpatient - Monitor renal function and support electrolyte status   #HFrEF Echo 10/12/2023 showed EF of 20 to 25%.  Patient seems euvolemic on exam.  Patient is now on metoprolol  succinate 50 mg daily.  No acute concern for exacerbation at this time.  -Continue metoprolol  succinate 50 mg daily - Renal function will not allow for spironolactone , Entresto , or SGLT2 at this time   #History of LV thrombus #New subscapular splenic infarct - No acute concerns at this time - Continue Eliquis  5 mg twice daily   Diet: Normal IVF: None,None VTE: Eliquise Code: Full PT/OT recs: SNF for Subacute PT TOC recs: Getting paperwork completed for short term disability    Dispo: Anticipated discharge to Skilled nursing facility in 5 days pending placement.  Signature:  Beda Dula [facility]ad[facility]  1:45 PM, 12/14/2023  On Call pager [phone]

## 2023-12-14 NOTE — Plan of Care (Signed)
     Brief palliative note:   Chart reviewed. Initial palliative consult completed 7/6.  Goals have been clear for full scope treatment and recovery to degree this is possible given patient's multiple comorbidities.  Disposition is pending.   PMT will sign off at this time. Please place a new consult if additional palliative needs should arise.    Recardo Loll, NP-C Palliative Medicine   Please call Palliative Medicine team phone with any questions [phone]. For individual providers please see AMION.   No charge

## 2023-12-14 NOTE — TOC Progression Note (Addendum)
 Transition of Care [facility]) - Progression Note    Patient Details  Name: Derek Thompson MRN: [id number] Date of Birth: 1960/08/13  [facility] CM/SW Contact  [name] [place], [name] Phone Number: 12/14/2023, 9:40 AM  Clinical Narrative:     Awaiting confirmation from [facility] if they were able to start insurance auth for SNF.   1055: CSW spoke with Angela(MFA hospital liaison) and is notified that when Editor, commission[employer] for [name] contacted pt's insurance they were informed that pt's benefit ends tomorrow and will no longer be covered. Regional director has decided that they can no longer offer a bed due to recurrent insurance issues and risk for him losing coverage again. CSW informed that this would apply for all MFA facilities( [facility], [facility], [facility], etc.). Jon also informed CSW that pt apparently exhausted his 58 allowed SNF days under his BCBS benefit so even if BCBS is active, pt no longer has any SNF coverage.   At this point if pt still needing SNF level of care, pt will need medicaid. May be possible to get an LOG but pt would need a medicaid Child psychotherapist assigned first. CSW contacted Physiological scientist and requested update on medicaid application.    Pt has no other SNF offers. CSW resent referrals in hub.   1145: CSW met with pt and updated him. Discussed options to wait for medicaid approval vs potentially try to rehab pt at hospital [facility]. Pt is open to rehabbing at hospital. [facility] and [facility] with Barnie. Informed that they would be fine with pt returning though he would have to be independent with ADL's and he would have to get approval through the [place] again.   CSW updated treatment team. CSW including TOC leadership to see if pt can participate in [facility] program.   Expected Discharge Plan: Skilled Nursing Facility Barriers to Discharge: Insurance  Authorization               Expected Discharge Plan and Services In-house Referral: Clinical Social Work   Post Acute Care Choice: Skilled Nursing Facility Living arrangements for the past 2 months: Homeless Shelter                                       Social Drivers of Health (SDOH) Interventions SDOH Screenings   Food Insecurity: No Food Insecurity (11/02/2023)  Housing: High Risk (11/02/2023)  Transportation Needs: Unmet Transportation Needs (11/02/2023)  Utilities: Not At Risk (11/02/2023)  Alcohol Screen: Low Risk  (08/19/2022)  Depression (PHQ2-9): Low Risk  (10/11/2023)  Financial Resource Strain: Low Risk  (08/19/2022)  Tobacco Use: Medium Risk (11/17/2023)    Readmission Risk Interventions     No data to display

## 2023-12-14 NOTE — Plan of Care (Signed)
 Patient has maintain skin integrity and perfusion. Patient is still unsure of his feelings towards his dispo and placement, but has been pleasant for this nurse.

## 2023-12-15 LAB — GLUCOSE, CAPILLARY
Glucose-Capillary: 161 mg/dL — ABNORMAL HIGH (ref 70–99)
Glucose-Capillary: 314 mg/dL — ABNORMAL HIGH (ref 70–99)
Glucose-Capillary: 181 mg/dL — ABNORMAL HIGH (ref 70–99)
Glucose-Capillary: 125 mg/dL — ABNORMAL HIGH (ref 70–99)

## 2023-12-15 NOTE — Progress Notes (Signed)
 HD#42 SUBJECTIVE:  Patient Summary:Derek Thompson is a 63 y.o. male with past medical history of HFrEF, urinary retention with chronic Foley, chronic osteomyelitis of right foot who presented with lethargy. He is now status post right BKA and now medically ready for discharge awaiting SNF placement  Overnight Events: N/A  Interim History:   OBJECTIVE:  Vital Signs: Vitals:   12/14/23 1652 12/14/23 1958 12/15/23 0419 12/15/23 0809  BP: (!) 97/56 (!) 99/54 115/69 113/66  Pulse: 95 95 96 88  Resp: 16 18 18 17   Temp: 98 F (36.7 C) 99.3 F (37.4 C) 99.3 F (37.4 C) 99.2 F (37.3 C)  TempSrc: Oral  Oral Oral  SpO2: 100% 100% 98% 99%  Weight:      Height:       Supplemental O2: Room Air SpO2: 99 % O2 Flow Rate (L/min): 5 L/min  Filed Weights   12/10/23 0500 12/11/23 0500 12/14/23 0500  Weight: 62.2 kg 62.1 kg 61.7 kg     Intake/Output Summary (Last 24 hours) at 12/15/2023 9182 Last data filed at 12/15/2023 9189 Gross per 24 hour  Intake 520 ml  Output 1150 ml  Net -630 ml   Net IO Since Admission: -37,171.5 mL [12/15/23 0817]  Physical Exam: Constitutional: well-appearing in bed, no acute distress Cardiovascular: Regular rate and rhythm, no murmurs, rubs, or gallops Pulmonary/Chest: normal work of breathing on room air, lungs clear to auscultation bilaterally Abdominal: soft, non-tender, non-distended MSK: Right BKA appreciated, staples removed and surgical site healing well  Patient Lines/Drains/Airways Status     Active Line/Drains/Airways     Name Placement date Placement time Site Days   Peripheral IV 12/14/23 22 G 1 Left;Lateral;Posterior Forearm 12/14/23  0747  Forearm  1   Urethral Catheter Mekides Nida, RN Coude 16 Fr. 12/08/23  1826  Coude  7   Wound 11/16/23 0826 Surgical  Sacrum Left 11/16/23  0826  Sacrum  29   Wound 11/17/23 1030 Surgical Closed Surgical Incision Knee Right 11/17/23  1030  Knee  28            Pertinent labs and imaging:       Latest Ref Rng & Units 12/14/2023    4:34 AM 12/10/2023    7:33 AM 12/09/2023    4:34 AM  CBC  WBC 4.0 - 10.5 K/uL 6.3  5.9  5.6   Hemoglobin 13.0 - 17.0 g/dL 8.5  8.3  7.8   Hematocrit 39.0 - 52.0 % 27.6  26.6  24.7   Platelets 150 - 400 K/uL 467  435  415        Latest Ref Rng & Units 12/14/2023    4:34 AM 12/12/2023    5:05 AM 12/10/2023    7:33 AM  CMP  Glucose 70 - 99 mg/dL 728  897  896   BUN 8 - 23 mg/dL 63  63  67   Creatinine 0.61 - 1.24 mg/dL 6.97  7.11  7.06   Sodium 135 - 145 mmol/L 132  131  133   Potassium 3.5 - 5.1 mmol/L 4.8  4.9  4.8   Chloride 98 - 111 mmol/L 95  92  95   CO2 22 - 32 mmol/L 25  28  27    Calcium  8.9 - 10.3 mg/dL 8.6  8.5  8.9     No results found.  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 male with past medical history of HFrEF, urinary retention with chronic Foley, chronic osteomyelitis of right foot who presented with concerns of lethargy found to have chronic osteo.  He is now status post right BKA and now medically ready for discharge awaiting placement. The patient's SNF benefits through BCBS have been exhausted, and MFA facilities (including [facility], [facility], and [facility]) are no longer able to offer placement due to recurrent insurance issues and  risk of future coverage loss. CSW reviewed this with the patient and discussed next steps, including waiting for Medicaid approval or attempting to rehab in the hospital with possible discharge back to the [facility]. The patient is open to hospital rehab, and the [facility] confirmed he may return if independent with ADLs and approved again by the [place].   #Status post BKA of right foot #Chronic osteomyelitis of right foot, resolved Patient doing well postop.  No signs of further infection or complications.  He has been working with PT/OT in progressing well.  Patient is currently medically discharged and awaiting SNF placement.  -Monitor for signs of infection - PT/OT following   #AKI on suspected CKD #Hyponatremia #Hyperkalemia  Most recent creatinine at 3.02. Sodium seems to be stable.  Last potassium at 4.8.   - Monitor BMP - hold metformin  (GFR:22)  #Type 2 diabetes mellitus Last BG was 102, no hypoglycemic even - Continue holding NovoLog   - hold metformin  (GFR:22) - Follow-up A1c outpatient   #CAUTI, resolved Most recent catheter change on 12/08/2023.  No acute concerns at this time as infection has resolved. -Continue routine catheter care   #Concern for multiple myeloma Patient had SPEP with positive M spike as well as elevated protein gap.  Bone marrow biopsy finding concerns of multiple myeloma.  Can follow-up outpatient. - Consulted oncology during hospital stay who will follow-up with patient outpatient - Monitor renal function and support electrolyte status   #HFrEF Echo 10/12/2023 showed EF of 20 to 25%.  Patient seems euvolemic on exam.  Patient is now on metoprolol  succinate 50 mg daily.  No acute concern for exacerbation at this time.  -Continue metoprolol  succinate 50 mg daily - Renal function will not allow for spironolactone , Entresto , or SGLT2 at this time   #History of LV thrombus #New subscapular splenic infarct - No acute concerns at this time -  Continue Eliquis  5 mg twice daily   Diet: Normal IVF: None,None VTE: Eliquise Code: Full TOC recs: The patient's SNF benefits through BCBS have been exhausted, and MFA facilities (including [place], [facility], and [facility]) are no longer able to offer placement due to recurrent insurance issues and risk of future coverage loss. CSW reviewed this with the patient and discussed next steps, including waiting for Medicaid approval or attempting to rehab in the hospital with possible discharge back to the [facility]. The patient is open  to hospital rehab, and the [facility] confirmed he may return if independent with ADLs and approved again by the [place]. Dispo: [facility]  Signature: Crystalynn Mcinerney Bernadine Jol[facility]  8:17 AM, 12/15/2023  On Call pager [phone]

## 2023-12-15 NOTE — Plan of Care (Signed)
  Problem: Clinical Measurements: Goal: Ability to maintain clinical measurements within normal limits will improve Outcome: Progressing   Problem: Activity: Goal: Risk for activity intolerance will decrease Outcome: Progressing   Problem: Nutrition: Goal: Adequate nutrition will be maintained Outcome: Progressing   Problem: Coping: Goal: Level of anxiety will decrease Outcome: Progressing   Problem: Elimination: Goal: Will not experience complications related to bowel motility Outcome: Progressing Goal: Will not experience complications related to urinary retention Outcome: Progressing   Problem: Education: Goal: Individualized Educational Video(s) Outcome: Progressing   Problem: Coping: Goal: Ability to adjust to condition or change in health will improve Outcome: Progressing   Problem: Fluid Volume: Goal: Ability to maintain a balanced intake and output will improve Outcome: Progressing   Problem: Health Behavior/Discharge Planning: Goal: Ability to identify and utilize available resources and services will improve Outcome: Progressing Goal: Ability to manage health-related needs will improve Outcome: Progressing   Problem: Metabolic: Goal: Ability to maintain appropriate glucose levels will improve Outcome: Progressing   Problem: Nutritional: Goal: Maintenance of adequate nutrition will improve Outcome: Progressing Goal: Progress toward achieving an optimal weight will improve Outcome: Progressing   Problem: Tissue Perfusion: Goal: Adequacy of tissue perfusion will improve Outcome: Progressing

## 2023-12-15 NOTE — Plan of Care (Signed)
   Problem: Nutrition: Goal: Adequate nutrition will be maintained Outcome: Progressing   Problem: Elimination: Goal: Will not experience complications related to bowel motility Outcome: Progressing

## 2023-12-15 NOTE — Progress Notes (Signed)
 Physical Therapy Treatment Patient Details Name: Derek Thompson MRN: [id number] DOB: December 16, 1960 Today's Date: 12/15/2023   History of Present Illness Pt is a 63 y/o M presenting to ED on 11/02/23 with lethargy. Pt found to be tachycardic, hyperkalemic, and hyponatremic. 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    PT Comments  Pt progressing well towards his physical therapy goals and is motivated to participate. Demonstrates improving activity tolerance. Session focused on reviewing bed level therapeutic exercises for R residual limb strengthening, transfer training, w/c propulsion and review of part management and gait training. Pt requiring up to min assist for certain transfers and is able to propel a w/c over level surfaces with supervision and cueing for obstacle negotiation. Goals updated.    If plan is discharge home, recommend the following: A lot of help with bathing/dressing/bathroom;Supervision due to cognitive status;Help with stairs or ramp for entrance;Assistance with cooking/housework;Assist for transportation;A little help with walking and/or transfers   Can travel by private vehicle     Yes  Equipment Recommendations  Rolling walker (2 wheels);BSC/3in1;Wheelchair (measurements PT);Wheelchair cushion (measurements PT)    Recommendations for Other Services       Precautions / Restrictions Precautions Precautions: Fall;Other (comment) Recall of Precautions/Restrictions: Impaired Precaution/Restrictions Comments: R BKA- no pillows under knee Required Braces or Orthoses: Other Brace Other Brace: R limb guard protector Restrictions Weight Bearing Restrictions Per Provider Order: Yes RLE Weight Bearing Per  Provider Order: Non weight bearing     Mobility  Bed Mobility Overal bed mobility: Modified Independent                  Transfers Overall transfer level: Needs assistance Equipment used: Rolling walker (2 wheels) Transfers: Sit to/from Stand, Bed to chair/wheelchair/BSC Sit to Stand: Min assist   Step pivot transfers: Contact guard assist      Lateral/Scoot Transfers: Contact guard assist General transfer comment: CGA for lateral scoot transfer towards left to w/c, with assist for w/c management i.e. elevating armrest and locking brakes. MinA to stand up/boost from w/c to RW, CGA for stand pivot back to bed    Ambulation/Gait Ambulation/Gait assistance: Contact guard assist Gait Distance (Feet): 10 Feet Assistive device: Rolling walker (2 wheels) Gait Pattern/deviations: Step-to pattern Gait velocity: decreased     General Gait Details: Hop to pattern with cues to not let L foot pass front of walker, w/c follow utilized   Psychologist, counselling mobility: Yes Wheelchair propulsion: Both upper extremities Wheelchair parts: Supervision/cueing Distance: 400 (100', 100', 200')   Tilt Bed    Modified Rankin (Stroke Patients Only)       Balance Overall balance assessment: Needs assistance Sitting-balance support: No upper extremity supported, Feet supported Sitting balance-Leahy Scale: Good     Standing balance support: Bilateral upper extremity supported, During functional activity, Reliant on assistive device for balance Standing balance-Leahy Scale: Poor Standing balance comment: reliant on RW to maintain balance                            Communication Communication Communication: No apparent difficulties  Cognition Arousal: Alert Behavior During Therapy: Flat affect  PT - Cognitive impairments: Awareness                         Following commands: Intact      Cueing  Cueing Techniques: Verbal cues  Exercises Amputee Exercises Hip Extension: AROM, Right, 10 reps, Sidelying Hip ABduction/ADduction: AROM, Right, 10 reps, Sidelying Other Exercises Other Exercises: Supine: modified bridge with towel roll underneath R residual limb x 10 Other Exercises: Supine: long sitting LLE hamstring stretch    General Comments        Pertinent Vitals/Pain Pain Assessment Pain Assessment: No/denies pain    Home Living                          Prior Function            PT Goals (current goals can now be found in the care plan section) Acute Rehab PT Goals Time For Goal Achievement: 12/29/23 Potential to Achieve Goals: Good Additional Goals Additional Goal #1: Pt will propel w/c community distances over level and unlevel surfaces with BUE's modI Additional Goal #2: Pt will be independent with w/c parts and management Progress towards PT goals: Progressing toward goals    Frequency    Min 2X/week      PT Plan      Co-evaluation              AM-PAC PT 6 Clicks Mobility   Outcome Measure  Help needed turning from your back to your side while in a flat bed without using bedrails?: None Help needed moving from lying on your back to sitting on the side of a flat bed without using bedrails?: None Help needed moving to and from a bed to a chair (including a wheelchair)?: A Little Help needed standing up from a chair using your arms (e.g., wheelchair or bedside chair)?: A Little Help needed to walk in hospital room?: A Little Help needed climbing 3-5 steps with a railing? : Total 6 Click Score: 18    End of Session Equipment Utilized During Treatment: Gait belt Activity Tolerance: Patient tolerated treatment well Patient left: in bed;with call bell/phone within reach Nurse Communication: Mobility status PT Visit Diagnosis: Unsteadiness on feet (R26.81);Difficulty in walking, not elsewhere classified (R26.2);Other abnormalities of  gait and mobility (R26.89);Muscle weakness (generalized) (M62.81) Pain - Right/Left: Right Pain - part of body: Ankle and joints of foot     Time: 9040-8959 PT Time Calculation (min) (ACUTE ONLY): 41 min  Charges:    $Therapeutic Exercise: 8-22 mins $Therapeutic Activity: 23-37 mins PT General Charges $$ ACUTE PT VISIT: 1 Visit                     Aleck Daring, PT, DPT Acute Rehabilitation Services Office [phone]    Alayne ONEIDA Daring 12/15/2023, 11:12 AM

## 2023-12-16 DIAGNOSIS — Z89511 Acquired absence of right leg below knee: Secondary | ICD-10-CM | POA: Diagnosis not present

## 2023-12-16 DIAGNOSIS — E11628 Type 2 diabetes mellitus with other skin complications: Secondary | ICD-10-CM | POA: Diagnosis not present

## 2023-12-16 DIAGNOSIS — M86671 Other chronic osteomyelitis, right ankle and foot: Secondary | ICD-10-CM | POA: Diagnosis not present

## 2023-12-16 DIAGNOSIS — E1169 Type 2 diabetes mellitus with other specified complication: Secondary | ICD-10-CM | POA: Diagnosis not present

## 2023-12-16 LAB — BASIC METABOLIC PANEL WITH GFR
Anion gap: 9 (ref 5–15)
BUN: 63 mg/dL — ABNORMAL HIGH (ref 8–23)
CO2: 28 mmol/L (ref 22–32)
Calcium: 8.6 mg/dL — ABNORMAL LOW (ref 8.9–10.3)
Chloride: 96 mmol/L — ABNORMAL LOW (ref 98–111)
Creatinine, Ser: 2.86 mg/dL — ABNORMAL HIGH (ref 0.61–1.24)
GFR, Estimated: 24 mL/min — ABNORMAL LOW (ref >60)
Glucose, Bld: 203 mg/dL — ABNORMAL HIGH (ref 70–99)
Potassium: 4.9 mmol/L (ref 3.5–5.1)
Sodium: 133 mmol/L — ABNORMAL LOW (ref 135–145)

## 2023-12-16 LAB — GLUCOSE, CAPILLARY
Glucose-Capillary: 179 mg/dL — ABNORMAL HIGH (ref 70–99)
Glucose-Capillary: 248 mg/dL — ABNORMAL HIGH (ref 70–99)
Glucose-Capillary: 298 mg/dL — ABNORMAL HIGH (ref 70–99)
Glucose-Capillary: 225 mg/dL — ABNORMAL HIGH (ref 70–99)

## 2023-12-16 NOTE — Progress Notes (Signed)
 HD#[id number] SUBJECTIVE:  Patient Summary: Derek Thompson is a 63 y.o. male with past medical history of HFrEF, urinary retention with chronic Foley, chronic osteomyelitis of right foot who presented with lethargy. He is now status post right BKA and now medically ready for discharge awaiting safe placement   Overnight Events: N/A  Interim History:  Patient is doing good just waiting for safe placement.  OBJECTIVE:  Vital Signs: Vitals:   12/15/23 1108 12/15/23 1645 12/15/23 1951 12/16/23 0405  BP: 113/66 104/64 (!) 97/52 119/72  Pulse: 88 89 93 100  Resp:  18 17 18   Temp:  98.9 F (37.2 C) 98.6 F (37 C) 98.5 F (36.9 C)  TempSrc:  Oral Oral Oral  SpO2:  100% 100% 100%  Weight:      Height:       Supplemental O2: Room Air SpO2: 100 % O2 Flow Rate (L/min): 5 L/min  Filed Weights   12/10/23 0500 12/11/23 0500 12/14/23 0500  Weight: 62.2 kg 62.1 kg 61.7 kg     Intake/Output Summary (Last 24 hours) at 12/16/2023 9292 Last data filed at 12/16/2023 0600 Gross per 24 hour  Intake 240 ml  Output 2425 ml  Net -2185 ml   Net IO Since Admission: -39,476.5 mL [12/16/23 0707]  Physical Exam: Constitutional: well-appearing in bed, no acute distress Cardiovascular: Regular rate and rhythm, no murmurs, rubs, or gallops Pulmonary/Chest: normal work of breathing on room air, lungs clear to auscultation bilaterally Abdominal: soft, non-tender, non-distended MSK: Right BKA appreciated, staples removed and surgical site healing well  Patient Lines/Drains/Airways Status     Active Line/Drains/Airways     Name Placement date Placement time Site Days   Peripheral IV 12/14/23 22 G 1 Left;Lateral;Posterior Forearm 12/14/23  0747  Forearm  2   Urethral Catheter Mekides Nida, RN Coude 16 Fr. 12/08/23  1826  Coude  8   Wound 11/16/23 0826 Surgical  Sacrum Left 11/16/23  0826  Sacrum  30   Wound 11/17/23 1030 Surgical Closed Surgical Incision Knee Right 11/17/23  1030  Knee  29             Pertinent labs and imaging:      Latest Ref Rng & Units 12/14/2023    4:34 AM 12/10/2023    7:33 AM 12/09/2023    4:34 AM  CBC  WBC 4.0 - 10.5 K/uL 6.3  5.9  5.6   Hemoglobin 13.0 - 17.0 g/dL 8.5  8.3  7.8   Hematocrit 39.0 - 52.0 % 27.6  26.6  24.7   Platelets 150 - 400 K/uL 467  435  415        Latest Ref Rng & Units 12/14/2023    4:34 AM 12/12/2023    5:05 AM 12/10/2023    7:33 AM  CMP  Glucose 70 - 99 mg/dL 728  897  896   BUN 8 - 23 mg/dL 63  63  67   Creatinine 0.61 - 1.24 mg/dL 6.97  7.11  7.06   Sodium 135 - 145 mmol/L 132  131  133   Potassium 3.5 - 5.1 mmol/L 4.8  4.9  4.8   Chloride 98 - 111 mmol/L 95  92  95   CO2 22 - 32 mmol/L 25  28  27    Calcium  8.9 - 10.3 mg/dL 8.6  8.5  8.9     No results found.  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 male with past medical history of HFrEF, urinary retention with chronic Foley, chronic osteomyelitis of right foot who presented with concerns of lethargy found to have chronic osteo.  He is now status post right BKA and now medically ready for discharge awaiting placement. The patient's SNF benefits through BCBS have been exhausted, and MFA facilities (including [facility], [facility], and [facility]) are no longer able to offer placement  due to recurrent insurance issues and risk of future coverage loss. CSW reviewed this with the patient and discussed next steps, including waiting for Medicaid approval or attempting to rehab in the hospital with possible discharge back to the [facility]. The patient is open to hospital rehab, and the [facility] confirmed he may return if independent with ADLs and approved again by the [place].   #Status post BKA of right foot #Chronic osteomyelitis of right foot, resolved Patient doing well postop.  No signs of further infection or complications.  He has been working with PT/OT in progressing well.  Patient is currently medically discharged and awaiting SNF placement.  -Monitor for signs of infection - PT/OT following   #AKI on suspected CKD #Hyponatremia #Hyperkalemia Improved. Most recent creatinine at 2.8. Sodium seems to be stable.  Last potassium at 4.8.   - Monitor BMP - hold metformin  (GFR:22)   #Type 2 diabetes mellitus Last BG was 102, no hypoglycemic even - Continue holding NovoLog   - hold metformin  (GFR:22) - Follow-up A1c outpatient   #CAUTI, resolved Most recent catheter change on 12/08/2023.  No acute concerns at this time as infection has resolved. -Continue routine catheter care   #Concern for multiple myeloma Patient had SPEP with positive M spike as well as elevated protein gap.  Bone marrow biopsy finding concerns of multiple myeloma.  Can follow-up outpatient. - Consulted oncology during hospital stay who will follow-up with patient outpatient - Monitor renal function and support electrolyte status   #HFrEF Echo 10/12/2023 showed EF of 20 to 25%.  Patient seems euvolemic on exam.  Patient is now on metoprolol  succinate 50 mg daily.  No acute concern for exacerbation at this time.  -Continue metoprolol  succinate 50 mg daily - Renal function will not allow for spironolactone , Entresto , or SGLT2 at this time   #History of LV thrombus #New subscapular splenic  infarct - No acute concerns at this time - Continue Eliquis  5 mg twice daily   Diet: Normal IVF: None,None VTE: Eliquise Code: Full TOC recs: The patient's SNF benefits through BCBS have been exhausted, and MFA facilities (including [facility], [facility], and [facility]) are no longer able to offer placement due to recurrent insurance issues and risk of future coverage loss. CSW reviewed this with the patient and discussed next steps, including waiting for Medicaid approval or attempting to rehab in the hospital with possible discharge back to  the [facility]. The patient is open to hospital rehab, and the [facility] confirmed he may return if independent with ADLs and approved again by the [place]. Dispo: STAR  Signature:  Aiden Helzer Bernadine [facility]  7:07 AM, 12/16/2023  On Call pager [phone]

## 2023-12-17 DIAGNOSIS — I5020 Unspecified systolic (congestive) heart failure: Secondary | ICD-10-CM | POA: Diagnosis not present

## 2023-12-17 DIAGNOSIS — E119 Type 2 diabetes mellitus without complications: Secondary | ICD-10-CM

## 2023-12-17 DIAGNOSIS — M86671 Other chronic osteomyelitis, right ankle and foot: Secondary | ICD-10-CM | POA: Diagnosis not present

## 2023-12-17 DIAGNOSIS — N179 Acute kidney failure, unspecified: Secondary | ICD-10-CM | POA: Diagnosis not present

## 2023-12-17 DIAGNOSIS — Z89511 Acquired absence of right leg below knee: Secondary | ICD-10-CM | POA: Diagnosis not present

## 2023-12-17 LAB — GLUCOSE, CAPILLARY
Glucose-Capillary: 262 mg/dL — ABNORMAL HIGH (ref 70–99)
Glucose-Capillary: 252 mg/dL — ABNORMAL HIGH (ref 70–99)
Glucose-Capillary: 303 mg/dL — ABNORMAL HIGH (ref 70–99)
Glucose-Capillary: 103 mg/dL — ABNORMAL HIGH (ref 70–99)
Glucose-Capillary: 292 mg/dL — ABNORMAL HIGH (ref 70–99)

## 2023-12-17 NOTE — Progress Notes (Signed)
 HD#44 SUBJECTIVE:  Patient Summary: Derek Thompson is a 63 y.o. male with past medical history of HFrEF, urinary retention with chronic Foley, chronic osteomyelitis of right foot who presented with lethargy. He is now status post right BKA and now medically ready for discharge awaiting safe placement   Overnight Events: N/A  Interim History: Patient is doing good just waiting for SNF to get a place.    OBJECTIVE:  Vital Signs: Vitals:   12/16/23 1048 12/16/23 1704 12/16/23 2035 12/17/23 0512  BP: 110/67 110/73 (!) 85/52 (!) 104/57  Pulse: 91 91 98 99  Resp:  18 19 17   Temp:  98.6 F (37 C) 98.6 F (37 C) 98.2 F (36.8 C)  TempSrc:   Oral Oral  SpO2:  98% 99% 97%  Weight:      Height:       Supplemental O2: Room Air SpO2: 97 % O2 Flow Rate (L/min): 5 L/min  Filed Weights   12/10/23 0500 12/11/23 0500 12/14/23 0500  Weight: 62.2 kg 62.1 kg 61.7 kg     Intake/Output Summary (Last 24 hours) at 12/17/2023 9341 Last data filed at 12/17/2023 0023 Gross per 24 hour  Intake --  Output 1425 ml  Net -1425 ml   Net IO Since Admission: -40,901.5 mL [12/17/23 0658]  Physical Exam: Constitutional: well-appearing in bed, no acute distress Cardiovascular: Regular rate and rhythm, no murmurs, rubs, or gallops Pulmonary/Chest: normal work of breathing on room air, lungs clear to auscultation bilaterally Abdominal: soft, non-tender, non-distended MSK: Right BKA appreciated, staples removed and surgical site healing well Patient Lines/Drains/Airways Status     Active Line/Drains/Airways     Name Placement date Placement time Site Days   Peripheral IV 12/14/23 22 G 1 Left;Lateral;Posterior Forearm 12/14/23  0747  Forearm  3   Urethral Catheter Derek Nida, RN Coude 16 Fr. 12/08/23  1826  Coude  9   Wound 11/17/23 1030 Surgical Closed Surgical Incision Knee Right 11/17/23  1030  Knee  30            Pertinent labs and imaging:      Latest Ref Rng & Units 12/14/2023     4:34 AM 12/10/2023    7:33 AM 12/09/2023    4:34 AM  CBC  WBC 4.0 - 10.5 K/uL 6.3  5.9  5.6   Hemoglobin 13.0 - 17.0 g/dL 8.5  8.3  7.8   Hematocrit 39.0 - 52.0 % 27.6  26.6  24.7   Platelets 150 - 400 K/uL 467  435  415        Latest Ref Rng & Units 12/16/2023    6:28 AM 12/14/2023    4:34 AM 12/12/2023    5:05 AM  CMP  Glucose 70 - 99 mg/dL 796  728  897   BUN 8 - 23 mg/dL 63  63  63   Creatinine 0.61 - 1.24 mg/dL 7.13  6.97  7.11   Sodium 135 - 145 mmol/L 133  132  131   Potassium 3.5 - 5.1 mmol/L 4.9  4.8  4.9   Chloride 98 - 111 mmol/L 96  95  92   CO2 22 - 32 mmol/L 28  25  28    Calcium  8.9 - 10.3 mg/dL 8.6  8.6  8.5     No results found.  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 male with past medical history of HFrEF, urinary retention with chronic Foley, chronic osteomyelitis of right foot who presented with concerns of lethargy found to have chronic osteo.  He is now status post right BKA and now medically ready for discharge awaiting placement. The patient's SNF benefits through BCBS have been exhausted, and MFA facilities (including [place], [facility], and [facility]) are no longer able to offer placement due to recurrent insurance issues and risk of future coverage loss. CSW  reviewed this with the patient and discussed next steps, including waiting for Medicaid approval or attempting to rehab in the hospital with possible discharge back to the [facility]. The patient is open to hospital rehab, and the [facility] confirmed he may return if independent with ADLs and approved again by the [place].   #Status post BKA of right foot #Chronic osteomyelitis of right foot, resolved Patient doing well postop.  No signs of further infection or complications.  He has been working with PT/OT in progressing well.  Patient is currently medically discharged and awaiting SNF placement.  -Monitor for signs of infection - PT/OT following   #AKI on suspected CKD #Hyponatremia #Hyperkalemia Improved. Most recent creatinine at 2.8. Sodium seems to be stable.  Last potassium at 4.8.   - Monitor BMP - hold metformin  (GFR:22)   #Type 2 diabetes mellitus Last BG was 102, no hypoglycemic even - Continue holding NovoLog   - hold metformin  (GFR:22) - Follow-up A1c outpatient   #CAUTI, resolved Most recent catheter change on 12/08/2023.  No acute concerns at this time as infection has resolved. -Continue routine catheter care   #Concern for multiple myeloma Patient had SPEP with positive M spike as well as elevated protein gap.  Bone marrow biopsy finding concerns of multiple myeloma.  Can follow-up outpatient. - Consulted oncology during hospital stay who will follow-up with patient outpatient - Monitor renal function and support electrolyte status   #HFrEF Echo 10/12/2023 showed EF of 20 to 25%.  Patient seems euvolemic on exam.  Patient is now on metoprolol  succinate 50 mg daily.  No acute concern for exacerbation at this time.  -Continue metoprolol  succinate 50 mg daily - Renal function will not allow for spironolactone , Entresto , or SGLT2 at this time   #History of LV thrombus #New subscapular splenic infarct - No acute concerns at this time - Continue Eliquis  5 mg twice  daily   Diet: Normal IVF: None,None VTE: Eliquise Code: Full TOC recs: The patient's SNF benefits through [employer] have been exhausted, and MFA facilities (including [place], [facility], and [facility]) are no longer able to offer placement due to recurrent insurance issues and risk of future coverage loss. CSW reviewed this with the patient and discussed next steps, including waiting for Medicaid approval or attempting to rehab in the hospital with possible discharge back to the [facility]. The patient is open to hospital rehab, and the  [facility] confirmed he may return if independent with ADLs and approved again by the [place]. Dispo: STAR   Signature:  Laquentin Loudermilk [facility]ad[facility]  6:58 AM, 12/17/2023  On Call pager [phone]

## 2023-12-18 ENCOUNTER — Other Ambulatory Visit (HOSPITAL_COMMUNITY): Payer: Self-pay

## 2023-12-18 ENCOUNTER — Telehealth (HOSPITAL_COMMUNITY): Payer: Self-pay | Admitting: Pharmacy Technician

## 2023-12-18 DIAGNOSIS — E1169 Type 2 diabetes mellitus with other specified complication: Secondary | ICD-10-CM | POA: Diagnosis not present

## 2023-12-18 DIAGNOSIS — E1165 Type 2 diabetes mellitus with hyperglycemia: Secondary | ICD-10-CM | POA: Diagnosis not present

## 2023-12-18 DIAGNOSIS — N179 Acute kidney failure, unspecified: Secondary | ICD-10-CM | POA: Diagnosis not present

## 2023-12-18 DIAGNOSIS — M86171 Other acute osteomyelitis, right ankle and foot: Secondary | ICD-10-CM | POA: Diagnosis not present

## 2023-12-18 LAB — GLUCOSE, CAPILLARY
Glucose-Capillary: 225 mg/dL — ABNORMAL HIGH (ref 70–99)
Glucose-Capillary: 168 mg/dL — ABNORMAL HIGH (ref 70–99)
Glucose-Capillary: 317 mg/dL — ABNORMAL HIGH (ref 70–99)
Glucose-Capillary: 258 mg/dL — ABNORMAL HIGH (ref 70–99)

## 2023-12-18 MED ORDER — INSULIN GLARGINE 100 UNIT/ML ~~LOC~~ SOLN
10.0000 [IU] | Freq: Every day | SUBCUTANEOUS | Status: DC
  Administered 2023-12-18 – 2023-12-20 (×3): 10 [IU] via SUBCUTANEOUS
  Filled 2023-12-18 (×4): qty 0.1

## 2023-12-18 MED ORDER — GLUCERNA SHAKE PO LIQD
237.0000 mL | Freq: Three times a day (TID) | ORAL | Status: DC
  Administered 2023-12-18 – 2023-12-29 (×27): 237 mL via ORAL

## 2023-12-18 MED ORDER — JUVEN PO PACK
1.0000 | PACK | Freq: Two times a day (BID) | ORAL | Status: DC
  Administered 2023-12-18 – 2023-12-29 (×23): 1 via ORAL
  Filled 2023-12-18 (×21): qty 1

## 2023-12-18 NOTE — Progress Notes (Signed)
 Physical Therapy Treatment Patient Details Name: Derek Thompson MRN: [id number] DOB: Apr 12, 1961 Today's Date: 12/18/2023   History of Present Illness Pt is a 63 y/o M presenting to ED on 11/02/23 with lethargy. Pt found to be tachycardic, hyperkalemic, and hyponatremic. 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    PT Comments  Patient is agreeable to PT session. He was seated in the recliner chair on arrival to room. Min A - CGA provided for transfers from various surfaces. Reinforcement of rolling walker positioning and hand placement for safety. Short distance ambulation performed with rolling walker with occasional steadying assistance provided. Patient is fatigued with activity. Recommend to continue PT to maximize independence and decrease caregiver burden.    If plan is discharge home, recommend the following: A lot of help with bathing/dressing/bathroom;Supervision due to cognitive status;Help with stairs or ramp for entrance;Assistance with cooking/housework;Assist for transportation;A little help with walking and/or transfers   Can travel by private vehicle     Yes  Equipment Recommendations  Rolling walker (2 wheels);BSC/3in1;Wheelchair (measurements PT);Wheelchair cushion (measurements PT)    Recommendations for Other Services       Precautions / Restrictions Precautions Precautions: Fall;Other (comment) Recall of Precautions/Restrictions: Impaired Required Braces or Orthoses: Other Brace Other Brace: R limb guard protector Restrictions Weight Bearing Restrictions Per Provider Order: Yes RLE Weight Bearing Per Provider Order: Non weight bearing     Mobility  Bed Mobility               General bed  mobility comments: not assessed as patient sitting up on arrival and post session    Transfers Overall transfer level: Needs assistance Equipment used: Rolling walker (2 wheels) Transfers: Sit to/from Stand, Bed to chair/wheelchair/BSC Sit to Stand: Min assist, Contact guard assist   Step pivot transfers: Min assist       General transfer comment: 4 transfers performed. patient required reinforcement of hand placement and positioning of rolling walker for safety. increased independence with standing from recliner chair versus bed side commode    Ambulation/Gait Ambulation/Gait assistance: Contact guard assist, Min assist Gait Distance (Feet): 10 Feet Assistive device: Rolling walker (2 wheels) Gait Pattern/deviations:  (hop to pattern) Gait velocity: decreased     General Gait Details: cues for sequencing and safety with turns. occasional steadying assistance required. activity tolerance limited by fatigue   Stairs             Wheelchair Mobility     Tilt Bed    Modified Rankin (Stroke Patients Only)       Balance Overall balance assessment: Needs assistance Sitting-balance support: No upper extremity supported, Feet supported Sitting balance-Leahy Scale: Good     Standing balance support: Bilateral upper extremity supported, During functional activity, Reliant on assistive device for balance Standing balance-Leahy Scale: Poor Standing balance comment: heavy reliance on rolling walker for support                            Communication Communication Communication: No apparent difficulties  Cognition Arousal: Alert Behavior During Therapy: Flat affect   PT - Cognitive impairments: Awareness  Following commands: Intact Following commands impaired: Follows one step commands with increased time    Cueing Cueing Techniques: Verbal cues  Exercises      General Comments        Pertinent Vitals/Pain Pain  Assessment Pain Assessment: No/denies pain    Home Living                          Prior Function            PT Goals (current goals can now be found in the care plan section) Acute Rehab PT Goals Patient Stated Goal: to get to rehab PT Goal Formulation: With patient Time For Goal Achievement: 12/29/23 Potential to Achieve Goals: Good Progress towards PT goals: Progressing toward goals    Frequency    Min 2X/week      PT Plan      Co-evaluation              AM-PAC PT 6 Clicks Mobility   Outcome Measure  Help needed turning from your back to your side while in a flat bed without using bedrails?: None Help needed moving from lying on your back to sitting on the side of a flat bed without using bedrails?: None Help needed moving to and from a bed to a chair (including a wheelchair)?: A Little Help needed standing up from a chair using your arms (e.g., wheelchair or bedside chair)?: A Little Help needed to walk in hospital room?: A Little Help needed climbing 3-5 steps with a railing? : Total 6 Click Score: 18    End of Session   Activity Tolerance: Patient tolerated treatment well Patient left: in chair;with call bell/phone within reach Nurse Communication: Mobility status PT Visit Diagnosis: Unsteadiness on feet (R26.81);Difficulty in walking, not elsewhere classified (R26.2);Other abnormalities of gait and mobility (R26.89);Muscle weakness (generalized) (M62.81)     Time: 8869-8852 PT Time Calculation (min) (ACUTE ONLY): 17 min  Charges:    $Therapeutic Activity: 8-22 mins PT General Charges $$ ACUTE PT VISIT: 1 Visit                     Derek Thompson, PT, MPT    Derek LULLA Thompson 12/18/2023, 12:58 PM

## 2023-12-18 NOTE — Plan of Care (Signed)
  Problem: Activity: Goal: Risk for activity intolerance will decrease Outcome: Progressing   Problem: Coping: Goal: Level of anxiety will decrease Outcome: Progressing   Problem: Elimination: Goal: Will not experience complications related to bowel motility Outcome: Progressing Goal: Will not experience complications related to urinary retention Outcome: Progressing   Problem: Education: Goal: Individualized Educational Video(s) Outcome: Progressing   Problem: Coping: Goal: Ability to adjust to condition or change in health will improve Outcome: Progressing   Problem: Fluid Volume: Goal: Ability to maintain a balanced intake and output will improve Outcome: Progressing   Problem: Health Behavior/Discharge Planning: Goal: Ability to identify and utilize available resources and services will improve Outcome: Progressing Goal: Ability to manage health-related needs will improve Outcome: Progressing   Problem: Metabolic: Goal: Ability to maintain appropriate glucose levels will improve Outcome: Progressing   Problem: Nutritional: Goal: Maintenance of adequate nutrition will improve Outcome: Progressing Goal: Progress toward achieving an optimal weight will improve Outcome: Progressing   Problem: Tissue Perfusion: Goal: Adequacy of tissue perfusion will improve Outcome: Progressing

## 2023-12-18 NOTE — Progress Notes (Signed)
 Occupational Therapy Treatment Patient Details Name: Derek Thompson MRN: [id number] DOB: 1960/10/19 Today's Date: 12/18/2023   History of present illness Pt is a 63 y/o M presenting to ED on 11/02/23 with lethargy. Pt found to be tachycardic, hyperkalemic, and hyponatremic. 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   OT comments  Patient received in supine and was provided education on [facility] program, expectations, and goals. Patient states he would like to participate in program. Patient able to donn limb protector while long sitting in bed and was min assist for transfer to wheelchair with RW. Patient performed wheelchair mobility in hallway and out side with supervision. Patient returned to room and was able to transfer to recliner with min assist. Patient will benefit from continued inpatient follow up therapy, <3 hours/day. Acute OT to continue to follow to address established goals to facilitate DC to next venue of care.        If plan is discharge home, recommend the following:  A little help with walking and/or transfers;A little help with bathing/dressing/bathroom;Assistance with cooking/housework;Direct supervision/assist for medications management;Direct supervision/assist for financial management;Assist for transportation;Help with stairs or ramp for entrance   Equipment Recommendations  Other (comment) (defer)    Recommendations for Other Services      Precautions / Restrictions Precautions Precautions: Fall;Other (comment) Recall of Precautions/Restrictions: Impaired Required Braces or Orthoses: Other Brace Other Brace: R limb guard protector Restrictions Weight Bearing Restrictions Per Provider Order: Yes RLE  Weight Bearing Per Provider Order: Non weight bearing       Mobility Bed Mobility Overal bed mobility: Modified Independent Bed Mobility: Supine to Sit     Supine to sit: Modified independent (Device/Increase time), Used rails, HOB elevated     General bed mobility comments: able to get to EOB without assistance    Transfers Overall transfer level: Needs assistance Equipment used: Rolling walker (2 wheels) Transfers: Sit to/from Stand, Bed to chair/wheelchair/BSC Sit to Stand: Min assist, Contact guard assist     Step pivot transfers: Min assist     General transfer comment: transfers from EOB to wheelchair and wheelchair to recliner with min assist for balance during transfer     Balance Overall balance assessment: Needs assistance Sitting-balance support: No upper extremity supported, Feet supported Sitting balance-Leahy Scale: Good     Standing balance support: Bilateral upper extremity supported, During functional activity, Reliant on assistive device for balance Standing balance-Leahy Scale: Poor Standing balance comment: reliant on BUE support when standing                           ADL either performed or assessed with clinical judgement   ADL Overall ADL's : Needs assistance/impaired                     Lower Body Dressing: Set up;Sitting/lateral leans Lower Body Dressing Details (indicate cue type and reason): able to donn limb protector while long sitting               General ADL Comments: focused on wheelchair mobility and transfers    Extremity/Trunk Assessment              Vision       Perception  Praxis     Communication Communication Communication: No apparent difficulties   Cognition Arousal: Alert Behavior During Therapy: Flat affect Cognition: Cognition impaired     Awareness: Intellectual awareness intact, Online awareness impaired       OT - Cognition Comments: patient stated he felt  discouraged because he hasn't been able to go outside much                 Following commands: Intact Following commands impaired: Follows one step commands with increased time      Cueing   Cueing Techniques: Verbal cues  Exercises Exercises: Other exercises Other Exercises Other Exercises: supervision for wheelchair mobility    Shoulder Instructions       General Comments Discussed [facility] program and expectations. Patient agreeable to work with program    Pertinent Vitals/ Pain       Pain Assessment Pain Assessment: No/denies pain Pain Intervention(s): Monitored during session  Home Living                                          Prior Functioning/Environment              Frequency  Min 2X/week        Progress Toward Goals  OT Goals(current goals can now be found in the care plan section)  Progress towards OT goals: Progressing toward goals  Acute Rehab OT Goals Patient Stated Goal: to get a prosthesis OT Goal Formulation: With patient Time For Goal Achievement: 12/20/23 Potential to Achieve Goals: Fair ADL Goals Pt Will Perform Grooming: with supervision;standing Pt Will Perform Lower Body Bathing: with set-up;sitting/lateral leans Pt Will Perform Upper Body Dressing: with set-up;sitting Pt Will Perform Lower Body Dressing: with set-up;sitting/lateral leans Pt Will Transfer to Toilet: with supervision;ambulating;bedside commode Pt Will Perform Toileting - Clothing Manipulation and hygiene: with set-up;sitting/lateral leans Pt/caregiver will Perform Home Exercise Program: Increased strength;Both right and left upper extremity;With theraband;With theraputty;Independently;With written HEP provided Additional ADL Goal #1: Pt will be able to follow 1 step commands with >50% accuracy to maximize participation in ADLs  Plan      Co-evaluation                 AM-PAC OT 6 Clicks Daily Activity     Outcome Measure   Help from  another person eating meals?: None Help from another person taking care of personal grooming?: A Little Help from another person toileting, which includes using toliet, bedpan, or urinal?: A Little Help from another person bathing (including washing, rinsing, drying)?: A Little Help from another person to put on and taking off regular upper body clothing?: A Little Help from another person to put on and taking off regular lower body clothing?: A Little 6 Click Score: 19    End of Session Equipment Utilized During Treatment: Gait belt;Rolling walker (2 wheels)  OT Visit Diagnosis: Unsteadiness on feet (R26.81);Other abnormalities of gait and mobility (R26.89);Muscle weakness (generalized) (M62.81);Other symptoms and signs involving cognitive function Pain - Right/Left: Right Pain - part of body: Leg   Activity Tolerance Patient tolerated treatment well   Patient Left in chair;with call bell/phone within reach   Nurse Communication Mobility status        Time: 9076-8988 OT Time Calculation (min): 48 min  Charges: OT General Charges $OT Visit: 1 Visit OT Treatments $Self Care/Home Management : 8-22 mins $Therapeutic Activity: 8-22  mins $Neuromuscular Re-education: 8-22 mins  Dick Laine, OTA Acute Rehabilitation Services  Office [phone]   Jeb LITTIE Laine 12/18/2023, 2:45 PM

## 2023-12-18 NOTE — Telephone Encounter (Signed)
 Patient Product/process development scientist completed.    The patient is insured through [facility]. Patient has [employer], may use a copay card, and/or apply for patient assistance if available.    Ran test claim for Lantus  Pen and the current 30 day co-pay is $23.38.   This test claim was processed through [facility]- copay amounts may vary at other pharmacies due to pharmacy/plan contracts, or as the patient moves through the different stages of their insurance plan.     Reyes Sharps, CPHT Pharmacy Technician III Certified Patient Advocate [facility] Patient Advocate Team Direct Number: [phone]  Fax: [phone]

## 2023-12-18 NOTE — Inpatient Diabetes Management (Signed)
 Inpatient Diabetes Program Recommendations  AACE/ADA: New Consensus Statement on Inpatient Glycemic Control (2015)  Target Ranges:  Prepandial:   less than 140 mg/dL      Peak postprandial:   less than 180 mg/dL (1-2 hours)      Critically ill patients:  140 - 180 mg/dL   Lab Results  Component Value Date   GLUCAP 168 (H) 12/18/2023   HGBA1C 8.7 (H) 09/02/2023    Review of Glycemic Control  Diabetes history: DM 2 Outpatient Diabetes medications: Farxiga  10 mg Daily, metformin  1000 mg Daily Current orders for Inpatient glycemic control:  Lantus  10 units Daily Farxiga  10 mg Daily Novolog  0-9 units tid + hs Glucerna tid between meals(16 grams carbohydrates)  A1c 8.7% on 5/3  Discharge Recommendations: Long acting recommendations: Insulin  Glargine (LANTUS ) Solostar Pen dose TBD at discharge  Supply/Referral recommendations: Pen needles - standard   Use Adult Diabetes Insulin  Treatment Post Discharge order set.  Spoke with pt at bedside regarding the possible need for insulin  and demonstrated the use of the insulin  pen. Informed pt on copay. Pt declined return demonstrating. I attached step by step instructions on the use of insulin  pen in addition to a QR code which pt states he is familiar with using.  Thanks, Clotilda Bull RN, MSN, BC-ADM Inpatient Diabetes Coordinator Team Pager [phone] (8a-5p)

## 2023-12-18 NOTE — Progress Notes (Signed)
 HD#45 SUBJECTIVE:  Patient Summary:  Derek Thompson is a 63 y.o. male with past medical history of HFrEF, urinary retention with chronic Foley, chronic osteomyelitis of right foot who presented with lethargy. He is now status post right BKA and now medically ready for discharge awaiting safe placement    Overnight Events: N/A  Interim History:  Patient is doing good just waiting for safe placement.    OBJECTIVE:  Vital Signs: Vitals:   12/17/23 0837 12/17/23 1614 12/17/23 2011 12/18/23 0412  BP: 115/72 104/64 107/66 103/61  Pulse: (!) 101 99 100 (!) 102  Resp: 18 18 18 18   Temp: 98.5 F (36.9 C) 98.6 F (37 C) 98.1 F (36.7 C) 99.3 F (37.4 C)  TempSrc:   Oral Oral  SpO2: 96% 99% 97% 98%  Weight:      Height:       Supplemental O2: Room Air SpO2: 98 % O2 Flow Rate (L/min): 5 L/min  Filed Weights   12/10/23 0500 12/11/23 0500 12/14/23 0500  Weight: 62.2 kg 62.1 kg 61.7 kg     Intake/Output Summary (Last 24 hours) at 12/18/2023 9355 Last data filed at 12/18/2023 0419 Gross per 24 hour  Intake 1530 ml  Output 1950 ml  Net -420 ml   Net IO Since Admission: -41,321.5 mL [12/18/23 0644]  Physical Exam: Physical Exam Constitutional: well-appearing in bed, no acute distress Cardiovascular: Regular rate and rhythm, no murmurs, rubs, or gallops Pulmonary/Chest: normal work of breathing on room air, lungs clear to auscultation bilaterally Abdominal: soft, non-tender, non-distended MSK: Right BKA appreciated, staples removed and surgical site healing well    Patient Lines/Drains/Airways Status     Active Line/Drains/Airways     Name Placement date Placement time Site Days   Peripheral IV 12/14/23 22 G 1 Left;Lateral;Posterior Forearm 12/14/23  0747  Forearm  4   Urethral Catheter Mekides Nida, RN Coude 16 Fr. 12/08/23  1826  Coude  10   Wound 11/17/23 1030 Surgical Closed Surgical Incision Knee Right 11/17/23  1030  Knee  31            Pertinent labs  and imaging:      Latest Ref Rng & Units 12/14/2023    4:34 AM 12/10/2023    7:33 AM 12/09/2023    4:34 AM  CBC  WBC 4.0 - 10.5 K/uL 6.3  5.9  5.6   Hemoglobin 13.0 - 17.0 g/dL 8.5  8.3  7.8   Hematocrit 39.0 - 52.0 % 27.6  26.6  24.7   Platelets 150 - 400 K/uL 467  435  415        Latest Ref Rng & Units 12/16/2023    6:28 AM 12/14/2023    4:34 AM 12/12/2023    5:05 AM  CMP  Glucose 70 - 99 mg/dL 796  728  897   BUN 8 - 23 mg/dL 63  63  63   Creatinine 0.61 - 1.24 mg/dL 7.13  6.97  7.11   Sodium 135 - 145 mmol/L 133  132  131   Potassium 3.5 - 5.1 mmol/L 4.9  4.8  4.9   Chloride 98 - 111 mmol/L 96  95  92   CO2 22 - 32 mmol/L 28  25  28    Calcium  8.9 - 10.3 mg/dL 8.6  8.6  8.5     No results found.  ASSESSMENT/PLAN:  Assessment: Principal Problem:   Osteomyelitis of right foot (HCC) Active Problems:   CAD (coronary artery  disease)   Hyperkalemia   AKI (acute kidney injury) (HCC)   Anemia   LV (left ventricular) mural thrombus without MI (HCC)   Gangrene of right foot (HCC)   Hyponatremia   HFrEF (heart failure with reduced ejection fraction) (HCC)   Chronic osteomyelitis involving lower leg, right (HCC)   Frailty   Urinary retention   Generalized weakness   Sinus tachycardia   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 (HCC)   Chronic anemia   Hypoalbuminemia   Monoclonal gammopathy   Hx of right BKA (HCC)   Moderate protein-calorie malnutrition (HCC)   Plan: Derek Thompson is a 63 y.o. male with past medical history of HFrEF, urinary retention with chronic Foley, chronic osteomyelitis of right foot who presented with concerns of lethargy found to have chronic osteo.  He is now status post right BKA and now medically ready for discharge awaiting placement. The patient's SNF benefits through BCBS have been exhausted, and MFA facilities (including , [facility], and [facility]) are no longer able to offer placement due to recurrent  insurance issues and risk of future coverage loss. CSW reviewed this with the patient and discussed next steps, including waiting for Medicaid approval or attempting to rehab in the hospital with possible discharge back to the [facility]. The patient is open to hospital rehab, and the [facility] confirmed he may return if independent with ADLs and approved again by the [place].   #Status post BKA of right foot #Chronic osteomyelitis of right foot, resolved Patient doing well postop.  No signs of further infection or complications.  He has been working with PT/OT in progressing well.  Patient is currently medically discharged and awaiting SNF placement.  -Monitor for signs of infection - PT/OT following - To be fit for a prosthesis, the residual limb must be fully healed, typically around 3 months post-op, usually as an outpatient. Prosthetic fitting and training depend on the Prosthetist and are rarely reimbursed under Medicare Part A.  #Type 2 diabetes mellitus The patient was previously on metformin , glargine, and short-acting insulin . Due to episodes of hypoglycemia, insulin  was discontinued. Metformin  was also stopped because of reduced kidney function and low GFR. However, blood sugars have been elevated (250-300) over the past 2-3 days. The plan is to start glargine 10 units daily, monitor blood sugars regularly, and maintain dietary management. The most recent blood glucose was 102, with no hypoglycemic episodes reported. -Continue holding NovoLog  -Follow up on A1c as outpatient  #AKI on suspected CKD #Hyponatremia #Hyperkalemia Improved. Most recent creatinine at 2.8. Sodium seems to be stable.  Last potassium at 4.8.   - Monitor BMP - hold metformin  (GFR:22)  #Concern for multiple myeloma Patient had SPEP with positive M spike as well as elevated protein gap.  Bone marrow biopsy finding concerns of multiple myeloma. Can follow-up outpatient. - Consulted oncology during hospital  stay who will follow-up with patient outpatient - Monitor renal function and support electrolyte status   #HFrEF Stable. echo 10/12/2023 showed EF of 20 to 25%.  Patient seems euvolemic on exam.  Patient is now on metoprolol  succinate 50 mg daily.  No acute concern for exacerbation at this time.  -Continue metoprolol  succinate 50 mg daily - Renal function will not allow for spironolactone , Entresto , or SGLT2 at this time  #History of LV thrombus #New subscapular splenic infarct - No acute concerns at this time - Continue Eliquis  5 mg twice daily  #CAUTI, resolved Most recent catheter change  on 12/08/2023.  No acute concerns at this time as infection has resolved. -Continue routine catheter care     Diet: Normal IVF: None,None VTE: Eliquise Code: Full TOC recs: The patient's SNF benefits through BCBS have been exhausted, and MFA facilities (including [facility], [facility], and [facility]) are no longer able to offer placement due to recurrent insurance issues and risk of future coverage loss. CSW reviewed this with the patient and discussed next steps, including waiting for Medicaid approval or attempting to rehab in the hospital with possible discharge back to the [facility]. The patient is open to hospital rehab, and the [facility] confirmed he may return if independent with ADLs and approved again by the [place]. Dispo: [facility] Signature:  Dynesha Woolen [facility]  6:44 AM, 12/18/2023  On Call pager [phone]

## 2023-12-19 DIAGNOSIS — N179 Acute kidney failure, unspecified: Secondary | ICD-10-CM | POA: Diagnosis not present

## 2023-12-19 DIAGNOSIS — M86171 Other acute osteomyelitis, right ankle and foot: Secondary | ICD-10-CM | POA: Diagnosis not present

## 2023-12-19 DIAGNOSIS — E1169 Type 2 diabetes mellitus with other specified complication: Secondary | ICD-10-CM | POA: Diagnosis not present

## 2023-12-19 DIAGNOSIS — E1165 Type 2 diabetes mellitus with hyperglycemia: Secondary | ICD-10-CM | POA: Diagnosis not present

## 2023-12-19 LAB — GLUCOSE, CAPILLARY
Glucose-Capillary: 134 mg/dL — ABNORMAL HIGH (ref 70–99)
Glucose-Capillary: 149 mg/dL — ABNORMAL HIGH (ref 70–99)
Glucose-Capillary: 176 mg/dL — ABNORMAL HIGH (ref 70–99)
Glucose-Capillary: 140 mg/dL — ABNORMAL HIGH (ref 70–99)

## 2023-12-19 NOTE — Plan of Care (Signed)
  Problem: Activity: Goal: Risk for activity intolerance will decrease Outcome: Progressing   Problem: Coping: Goal: Level of anxiety will decrease Outcome: Progressing   Problem: Elimination: Goal: Will not experience complications related to bowel motility Outcome: Progressing Goal: Will not experience complications related to urinary retention Outcome: Progressing   Problem: Education: Goal: Individualized Educational Video(s) Outcome: Progressing

## 2023-12-19 NOTE — Plan of Care (Signed)
   Problem: Coping: Goal: Ability to adjust to condition or change in health will improve Outcome: Progressing

## 2023-12-19 NOTE — Progress Notes (Signed)
 Physical Therapy Treatment Patient Details Name: Derek Thompson MRN: [id number] DOB: 1960/09/28 Today's Date: 12/19/2023   History of Present Illness Pt is a 63 y/o M presenting to ED on 11/02/23 with lethargy. Pt found to be tachycardic, hyperkalemic, and hyponatremic. 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    PT Comments  STAR PT/OT Session: Pt introduced to [facility] program and expectations. Pt would like to work on transfers to and from wheelchair and propelling wheelchair outside over uneven surfaces. Pt is limited in safe mobility by global weakness, in the presence of change in center of gravity impairing balance, and impaired cognition/decreased memory. Pt is mod I for bed mobility, and minAx2 for transfers to and from the wheelchair. Pt is able to propel himself with bilateral UE in wheelchair, but fatigues quickly. PT/OT will continue to work with patient on his independence for discharge.     If plan is discharge home, recommend the following: A lot of help with bathing/dressing/bathroom;Supervision due to cognitive status;Help with stairs or ramp for entrance;Assistance with cooking/housework;Assist for transportation;A little help with walking and/or transfers   Can travel by private vehicle     Yes  Equipment Recommendations  Rolling walker (2 wheels);BSC/3in1;Wheelchair (measurements PT);Wheelchair cushion (measurements PT)    Recommendations for Other Services       Precautions / Restrictions Precautions Precautions: Fall;Other (comment) Recall of Precautions/Restrictions: Impaired Required Braces or Orthoses: Other Brace Other Brace: R limb guard protector Restrictions Weight Bearing Restrictions Per  Provider Order: Yes RLE Weight Bearing Per Provider Order: Non weight bearing     Mobility  Bed Mobility Overal bed mobility: Modified Independent             General bed mobility comments: able to get to EOB and back to supine without assitance    Transfers Overall transfer level: Needs assistance Equipment used: Rolling walker (2 wheels) Transfers: Sit to/from Stand, Bed to chair/wheelchair/BSC Sit to Stand: Min assist, +2 safety/equipment, Contact guard assist Stand pivot transfers: Min assist         General transfer comment: pt able to come to standing with contact guard of 2 in RW, but upon trying to pivot from bed to recliner pt sits just barely on the EoB as he says he cant't make it to the chair, RW had become hung on the wheel of the wheelchair, pt sits back on the bed, PT/OT discuss rearrangement of DME to be successful. Pt relates his fear of falling    Ambulation/Gait Ambulation/Gait assistance: Contact guard assist   Assistive device: Rolling walker (2 wheels) Gait Pattern/deviations:  (hop to) Gait velocity: decreased Gait velocity interpretation: <1.31 ft/sec, indicative of household ambulator   General Gait Details: cues for sequencing and safety with turns. occasional steadying assistance required. activity tolerance limited by fatigue    [employer] officer mobility: Yes Wheelchair propulsion: Both upper extremities Wheelchair parts: Supervision/cueing Distance: 100 feet max, multiple bouts through out session Wheelchair Assistance Details (indicate cue type and reason): pt needs increased cuing for brake and foot rest management. pt was able to propel himself over uneven surfaces outside      Balance Overall balance assessment: Needs assistance Sitting-balance support: No upper extremity supported, Feet supported Sitting balance-Leahy Scale: Good  Standing balance support: Bilateral upper extremity  supported, During functional activity, Reliant on assistive device for balance Standing balance-Leahy Scale: Poor Standing balance comment: heavy reliance on rolling walker for support                            Communication Communication Communication: Impaired Factors Affecting Communication: Difficulty expressing self  Cognition Arousal: Alert Behavior During Therapy: Flat affect   PT - Cognitive impairments: Awareness                         Following commands: Intact Following commands impaired: Follows one step commands with increased time    Cueing Cueing Techniques: Verbal cues  Exercises Other Exercises Other Exercises: 5 chair push up    General Comments General comments (skin integrity, edema, etc.): pt with increased fear of falling and maneuvering in small spaces      Pertinent Vitals/Pain Pain Assessment Pain Assessment: No/denies pain           PT Goals (current goals can now be found in the care plan section) Acute Rehab PT Goals Patient Stated Goal: to get to rehab PT Goal Formulation: With patient Time For Goal Achievement: 12/29/23 Potential to Achieve Goals: Good Progress towards PT goals: Progressing toward goals    Frequency    Min 2X/week           Co-evaluation   Reason for Co-Treatment: To address functional/ADL transfers;Other (comment) (for initial STAR visit) PT goals addressed during session: Mobility/safety with mobility OT goals addressed during session: ADL's and self-care      AM-PAC PT 6 Clicks Mobility   Outcome Measure  Help needed turning from your back to your side while in a flat bed without using bedrails?: None Help needed moving from lying on your back to sitting on the side of a flat bed without using bedrails?: None Help needed moving to and from a bed to a chair (including a wheelchair)?: A Little Help needed standing up from a chair using your arms (e.g., wheelchair or bedside  chair)?: A Little Help needed to walk in hospital room?: A Lot Help needed climbing 3-5 steps with a railing? : Total 6 Click Score: 17    End of Session   Activity Tolerance: Patient tolerated treatment well Patient left: in chair;with call bell/phone within reach Nurse Communication: Mobility status PT Visit Diagnosis: Unsteadiness on feet (R26.81);Difficulty in walking, not elsewhere classified (R26.2);Other abnormalities of gait and mobility (R26.89);Muscle weakness (generalized) (M62.81)     Time: 1245-1340 PT Time Calculation (min) (ACUTE ONLY): 55 min  Charges:    $Therapeutic Activity: 8-22 mins $Wheel Chair Management: 23-37 mins PT General Charges $$ ACUTE PT VISIT: 1 Visit                     Rebekka Lobello B. [name] PT, DPT Acute Rehabilitation Services Please use secure chat or  Call Office [phone]    [name] Fleeta [facility] 12/19/2023, 4:13 PM

## 2023-12-19 NOTE — Progress Notes (Signed)
 HD#[id number] SUBJECTIVE:  Patient Summary:   Derek Thompson is a 63 y.o. male with past medical history of HFrEF, urinary retention with chronic Foley, chronic osteomyelitis of right foot who presented with lethargy. He is now status post right BKA and now medically ready for discharge awaiting safe placement   Overnight Events: N/A  Interim History:  Patient is doing good just waiting for SNF to get a place.   OBJECTIVE:  Vital Signs: Vitals:   12/19/23 0500 12/19/23 0528 12/19/23 0718 12/19/23 1021  BP:  106/68 110/66 110/66  Pulse:  99 93 93  Resp:   18   Temp:  98.1 F (36.7 C) 98.7 F (37.1 C)   TempSrc:  Oral Oral   SpO2:  97% 98%   Weight: 62.7 kg     Height:       Supplemental O2: Room Air SpO2: 98 % O2 Flow Rate (L/min): 5 L/min  Filed Weights   12/11/23 0500 12/14/23 0500 12/19/23 0500  Weight: 62.1 kg 61.7 kg 62.7 kg     Intake/Output Summary (Last 24 hours) at 12/19/2023 1431 Last data filed at 12/18/2023 2100 Gross per 24 hour  Intake --  Output 1325 ml  Net -1325 ml   Net IO Since Admission: -42,406.5 mL [12/19/23 1431]  Physical Exam: Physical Exam  Patient Lines/Drains/Airways Status     Active Line/Drains/Airways     Name Placement date Placement time Site Days   Peripheral IV 12/14/23 22 G 1 Left;Lateral;Posterior Forearm 12/14/23  0747  Forearm  5   Urethral Catheter Mekides Nida, RN Coude 16 Fr. 12/08/23  1826  Coude  11   Wound 11/17/23 1030 Surgical Closed Surgical Incision Knee Right 11/17/23  1030  Knee  32            Pertinent labs and imaging:      Latest Ref Rng & Units 12/14/2023    4:34 AM 12/10/2023    7:33 AM 12/09/2023    4:34 AM  CBC  WBC 4.0 - 10.5 K/uL 6.3  5.9  5.6   Hemoglobin 13.0 - 17.0 g/dL 8.5  8.3  7.8   Hematocrit 39.0 - 52.0 % 27.6  26.6  24.7   Platelets 150 - 400 K/uL 467  435  415        Latest Ref Rng & Units 12/16/2023    6:28 AM 12/14/2023    4:34 AM 12/12/2023    5:05 AM  CMP  Glucose 70 - 99  mg/dL 796  728  897   BUN 8 - 23 mg/dL 63  63  63   Creatinine 0.61 - 1.24 mg/dL 7.13  6.97  7.11   Sodium 135 - 145 mmol/L 133  132  131   Potassium 3.5 - 5.1 mmol/L 4.9  4.8  4.9   Chloride 98 - 111 mmol/L 96  95  92   CO2 22 - 32 mmol/L 28  25  28    Calcium  8.9 - 10.3 mg/dL 8.6  8.6  8.5     No results found.  ASSESSMENT/PLAN:  Assessment: Principal Problem:   Osteomyelitis of right foot (HCC) Active Problems:   CAD (coronary artery disease)   Hyperkalemia   AKI (acute kidney injury) (HCC)   Anemia   LV (left ventricular) mural thrombus without MI (HCC)   Gangrene of right foot (HCC)   Hyponatremia   HFrEF (heart failure with reduced ejection fraction) (HCC)   Chronic osteomyelitis involving lower leg, right (HCC)  Frailty   Urinary retention   Generalized weakness   Sinus tachycardia   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 (HCC)   Chronic anemia   Hypoalbuminemia   Monoclonal gammopathy   Hx of right BKA (HCC)   Moderate protein-calorie malnutrition (HCC)   Plan: Cosme Manolito Jurewicz is a 63 y.o. male with past medical history of HFrEF, urinary retention with chronic Foley, chronic osteomyelitis of right foot who presented with concerns of lethargy found to have chronic osteo.  He is now status post right BKA and now medically ready for discharge awaiting placement. The patient's SNF benefits through BCBS have been exhausted, and MFA facilities (including [place], [facility], and [facility]) are no longer able to offer placement due to recurrent insurance issues and risk of future coverage loss. CSW reviewed this with the patient and discussed next steps, including waiting for Medicaid approval or attempting to rehab in the hospital with possible discharge back to the [facility]. The patient is open to hospital rehab, and the [facility] confirmed he may return if independent with ADLs and approved again by the [place].    #Status post BKA of right  foot #Chronic osteomyelitis of right foot, resolved Patient doing well postop.  No signs of further infection or complications.  He has been working with PT/OT in progressing well.  Patient is currently medically discharged and awaiting SNF placement.  -Monitor for signs of infection - PT/OT following - To be fit for a prosthesis, the residual limb must be fully healed, typically around 3 months post-op, usually as an outpatient. Prosthetic fitting and training depend on the Prosthetist and are rarely reimbursed under Medicare Part A.   #Type 2 diabetes mellitus The patient was previously on metformin , glargine, and short-acting insulin . Due to episodes of hypoglycemia, insulin  was discontinued. Metformin  was also stopped because of reduced kidney function and low GFR. However, blood sugars have been elevated (250-300) over the past 2-3 days. The plan is to start glargine 10 units daily, monitor blood sugars regularly, and maintain dietary management. The most recent blood glucose was 102, with no hypoglycemic episodes reported. -Continue holding NovoLog  -Follow up on A1c as outpatient   #AKI on suspected CKD #Hyponatremia #Hyperkalemia Improved. Most recent creatinine at 2.8. Sodium seems to be stable.  Last potassium at 4.8.   - Monitor BMP - hold metformin  (GFR:22)   #Concern for multiple myeloma Patient had SPEP with positive M spike as well as elevated protein gap.  Bone marrow biopsy finding concerns of multiple myeloma. Can follow-up outpatient. - Consulted oncology during hospital stay who will follow-up with patient outpatient - Monitor renal function and support electrolyte status   #HFrEF Stable. echo 10/12/2023 showed EF of 20 to 25%.  Patient seems euvolemic on exam.  Patient is now on metoprolol  succinate 50 mg daily.  No acute concern for exacerbation at this time.  -Continue metoprolol  succinate 50 mg daily - Renal function will not allow for spironolactone , Entresto , or  SGLT2 at this time   #History of LV thrombus #New subscapular splenic infarct - No acute concerns at this time - Continue Eliquis  5 mg twice daily   #CAUTI, resolved Most recent catheter change on 12/08/2023.  No acute concerns at this time as infection has resolved. -Continue routine catheter care     Diet: Normal IVF: None,None VTE: Eliquise Code: Full TOC recs: The patient's SNF benefits through BCBS have been exhausted, and MFA facilities (including [place], [facility], and [facility]) are  no longer able to offer placement due to recurrent insurance issues and risk of future coverage loss. CSW reviewed this with the patient and discussed next steps, including waiting for Medicaid approval or attempting to rehab in the hospital with possible discharge back to the [facility]. The patient is open to hospital rehab, and the [facility] confirmed he may return if independent with ADLs and approved again by the [place]. Dispo: STAR Signature:  Naarah Borgerding Bernadine [facility]  2:31 PM, 12/19/2023  On Call pager [phone]

## 2023-12-19 NOTE — Progress Notes (Signed)
 Occupational Therapy Treatment Patient Details Name: Derek Thompson MRN: [id number] DOB: 1960-12-23 Today's Date: 12/19/2023   History of present illness Pt is a 63 y/o M presenting to ED on 11/02/23 with lethargy. Pt found to be tachycardic, hyperkalemic, and hyponatremic. 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   OT comments  STAR Patient OT/PT session: Patient seen with PT to further discuss STAR program, goals, and expectations. Patient was found to have soiled bed and required max assist to clean before getting to EOB. From EOB LB dressing performed with patient requiring assistance to thread legs into clothing due to limb protector and catheter and mod assist to pull up clothing while standing. Patient requiring increased assistance with sit to stands and transfers with patient indicating fear of falling. Patient to continue to be followed by acute OT with STAR program to increase independence with self care and transfers.       If plan is discharge home, recommend the following:  A little help with walking and/or transfers;A little help with bathing/dressing/bathroom;Assistance with cooking/housework;Direct supervision/assist for medications management;Direct supervision/assist for financial management;Assist for transportation;Help with stairs or ramp for entrance   Equipment Recommendations  Other (comment) (defer)    Recommendations for Other Services      Precautions / Restrictions Precautions Precautions: Fall;Other (comment) Recall of Precautions/Restrictions: Impaired Required Braces or Orthoses: Other Brace Other Brace: R limb guard protector Restrictions Weight Bearing Restrictions Per Provider Order:  Yes RLE Weight Bearing Per Provider Order: Non weight bearing       Mobility Bed Mobility Overal bed mobility: Modified Independent             General bed mobility comments: able to get to EOB and back to supine without assitance    Transfers Overall transfer level: Needs assistance Equipment used: Rolling walker (2 wheels) Transfers: Sit to/from Stand, Bed to chair/wheelchair/BSC Sit to Stand: Min assist, +2 safety/equipment, Contact guard assist Stand pivot transfers: Min assist         General transfer comment: pt able to come to standing with contact guard of 2 in RW, but upon trying to pivot from bed to recliner pt sits just barely on the EoB as he says he cant't make it to the chair, RW had become hung on the wheel of the wheelchair, pt with increased     Balance Overall balance assessment: Needs assistance Sitting-balance support: No upper extremity supported, Feet supported Sitting balance-Leahy Scale: Good     Standing balance support: Bilateral upper extremity supported, During functional activity, Reliant on assistive device for balance Standing balance-Leahy Scale: Poor Standing balance comment: heavy reliance on rolling walker for support                           ADL either performed or assessed with clinical judgement   ADL Overall ADL's : Needs assistance/impaired             Lower Body Bathing: Maximal assistance;Bed level Lower Body Bathing Details (indicate cue type and reason): patient required assistance with cleaning at bedlevel due to soiling bed     Lower Body Dressing: Moderate assistance;Sit to/from stand Lower Body Dressing Details (indicate cue type and reason): min assist to thread  legs into clothing and mod assist to pull up while standing               General ADL Comments: patient requiring assistance with balance when standing for pulling up shorts    Extremity/Trunk Assessment              Vision        Perception     Praxis     Communication Communication Communication: Impaired Factors Affecting Communication: Difficulty expressing self   Cognition Arousal: Alert Behavior During Therapy: Flat affect Cognition: Cognition impaired     Awareness: Intellectual awareness intact, Online awareness impaired     Executive functioning impairment (select all impairments): Problem solving OT - Cognition Comments: patient appeared fearful of falling when transferring to wheelchair and back to bed                 Following commands: Intact Following commands impaired: Follows one step commands with increased time      Cueing   Cueing Techniques: Verbal cues  Exercises      Shoulder Instructions       General Comments performed wheelchair mobility in hallway and outside.    Pertinent Vitals/ Pain       Pain Assessment Pain Assessment: No/denies pain  Home Living                                          Prior Functioning/Environment              Frequency  Min 2X/week        Progress Toward Goals  OT Goals(current goals can now be found in the care plan section)  Progress towards OT goals: Progressing toward goals  Acute Rehab OT Goals Patient Stated Goal: to increase independence OT Goal Formulation: With patient Time For Goal Achievement: 12/20/23 Potential to Achieve Goals: Fair ADL Goals Pt Will Perform Grooming: with supervision;standing Pt Will Perform Lower Body Bathing: with set-up;sitting/lateral leans Pt Will Perform Upper Body Dressing: with set-up;sitting Pt Will Perform Lower Body Dressing: with set-up;sitting/lateral leans Pt Will Transfer to Toilet: with supervision;ambulating;bedside commode Pt Will Perform Toileting - Clothing Manipulation and hygiene: with set-up;sitting/lateral leans Pt/caregiver will Perform Home Exercise Program: Increased strength;Both right and left upper extremity;With theraband;With  theraputty;Independently;With written HEP provided Additional ADL Goal #1: Pt will be able to follow 1 step commands with >50% accuracy to maximize participation in ADLs  Plan      Co-evaluation    PT/OT/SLP Co-Evaluation/Treatment: Yes Reason for Co-Treatment: To address functional/ADL transfers;Other (comment) (for initial STAR visit) PT goals addressed during session: Mobility/safety with mobility OT goals addressed during session: ADL's and self-care      AM-PAC OT 6 Clicks Daily Activity     Outcome Measure   Help from another person eating meals?: None Help from another person taking care of personal grooming?: A Little Help from another person toileting, which includes using toliet, bedpan, or urinal?: A Little Help from another person bathing (including washing, rinsing, drying)?: A Little Help from another person to put on and taking off regular upper body clothing?: A Little Help from another person to put on and taking off regular lower body clothing?: A Little 6 Click Score: 19    End of Session Equipment Utilized During Treatment: Gait belt;Rolling walker (2 wheels);Other (comment) (wheelchair)  OT Visit Diagnosis: Unsteadiness on feet (R26.81);Other  abnormalities of gait and mobility (R26.89);Muscle weakness (generalized) (M62.81);Other symptoms and signs involving cognitive function   Activity Tolerance Patient tolerated treatment well   Patient Left in bed;with call bell/phone within reach   Nurse Communication Mobility status        Time: 1230-1340 OT Time Calculation (min): 70 min  Charges: OT General Charges $OT Visit: 1 Visit OT Treatments $Self Care/Home Management : 23-37 mins  Dick Laine, OTA Acute Rehabilitation Services  Office [phone]   Jeb LITTIE Laine 12/19/2023, 2:45 PM

## 2023-12-20 LAB — GLUCOSE, CAPILLARY
Glucose-Capillary: 205 mg/dL — ABNORMAL HIGH (ref 70–99)
Glucose-Capillary: 214 mg/dL — ABNORMAL HIGH (ref 70–99)
Glucose-Capillary: 147 mg/dL — ABNORMAL HIGH (ref 70–99)
Glucose-Capillary: 181 mg/dL — ABNORMAL HIGH (ref 70–99)

## 2023-12-20 NOTE — TOC Progression Note (Signed)
 Transition of Care [facility]) - Progression Note    Patient Details  Name: Derek Thompson MRN: [id number] Date of Birth: February 17, 1961  [facility]) CM/SW Contact  Lendia Dais, [place] Phone Number: 12/20/2023, 4:13 PM  Clinical Narrative:  CSW spoke to patient about discharge plan upon complete the STAR program.   CSW asked if the plan was for the [facility] and the pt agreed. CSW mentioned that we would have to touch basis with the [facility] representative Wolm Needle so that [facility] could approve the [facility]'s stay at the [facility]. The pt mentioned that I may need to get in touch with Adrien the coordinator at the [facility].   CSW will continue to follow.     Expected Discharge Plan: Skilled Nursing Facility Barriers to Discharge: Insurance Authorization               Expected Discharge Plan and Services In-house Referral: Clinical Social Work   Post Acute Care Choice: Skilled Nursing Facility Living arrangements for the past 2 months: Homeless Shelter                                       Social Drivers of Health (SDOH) Interventions SDOH Screenings   Food Insecurity: No Food Insecurity (11/02/2023)  Housing: High Risk (11/02/2023)  Transportation Needs: Unmet Transportation Needs (11/02/2023)  Utilities: Not At Risk (11/02/2023)  Alcohol Screen: Low Risk  (08/19/2022)  Depression (PHQ2-9): Low Risk  (10/11/2023)  Financial Resource Strain: Low Risk  (08/19/2022)  Tobacco Use: Medium Risk (11/17/2023)    Readmission Risk Interventions     No data to display

## 2023-12-20 NOTE — TOC Progression Note (Signed)
 Transition of Care [facility]) - Progression Note    Patient Details  Name: Derek Thompson MRN: [id number] Date of Birth: [birth year]  Transition of Care [facility]) CM/SW Contact  Tom-Johnson, Livier Hendel Daphne, RN Phone Number: 12/20/2023, 1:43 PM  Clinical Narrative:     Wheelchair ordered from [employer], CM was informed by Arthea that patient does not want to pay his co-pay at this time, he will not receive wheelchair until its paid and won't be available to do therapy. CM spoke with patient and he confirmed he will not be able to make payments until after couple of days. PT and team notified.   TOC will continue to follow as patient progresses with care towards discharge.       Expected Discharge Plan: Skilled Nursing Facility Barriers to Discharge: Insurance Authorization               Expected Discharge Plan and Services In-house Referral: Clinical Social Work   Post Acute Care Choice: Skilled Nursing Facility Living arrangements for the past 2 months: Homeless Shelter                                       Social Drivers of Health (SDOH) Interventions SDOH Screenings   Food Insecurity: No Food Insecurity (11/02/2023)  Housing: High Risk (11/02/2023)  Transportation Needs: Unmet Transportation Needs (11/02/2023)  Utilities: Not At Risk (11/02/2023)  Alcohol Screen: Low Risk  (08/19/2022)  Depression (PHQ2-9): Low Risk  (10/11/2023)  Financial Resource Strain: Low Risk  (08/19/2022)  Tobacco Use: Medium Risk (11/17/2023)    Readmission Risk Interventions     No data to display

## 2023-12-20 NOTE — Progress Notes (Signed)
 Physical Therapy Treatment Patient Details Name: Derek Thompson MRN: [id number] DOB: 07-15-1960 Today's Date: 12/20/2023   History of Present Illness Pt is a 63 y/o M presenting to ED on 11/02/23 with lethargy. Pt found to be tachycardic, hyperkalemic, and hyponatremic. 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    PT Comments  STAR PT Session: Pt had just finished showering and dressing with OT and was up in wheelchair ready to go. Practiced utilizing L LE to aid in propulsion and steering. Pt only able to propel about 100 feet at a time due to increased fatigue. Distance is only about 40 feet a bout over uneven surfaces outside. PT has asked TOC to procure his wheelchair and to make sure that it fits his long legs so that he can more efficiently use his L LE for propulsion. Pt ultimately needs max A for squat pivot transfer back to bed.  PT/OT will also work on [employer] given his frequent fatigue.     If plan is discharge home, recommend the following: A lot of help with bathing/dressing/bathroom;Supervision due to cognitive status;Help with stairs or ramp for entrance;Assistance with cooking/housework;Assist for transportation;A little help with walking and/or transfers   Can travel by private vehicle     Yes  Equipment Recommendations  BSC/3in1;Wheelchair (measurements PT);Wheelchair cushion (measurements PT);Other (comment) (needs to be fitted for wheelchair given his height)       Precautions / Restrictions Precautions Precautions: Fall;Other (comment) Recall of Precautions/Restrictions: Impaired Required Braces or Orthoses: Other Brace Other Brace: R limb guard protector Restrictions Weight Bearing  Restrictions Per Provider Order: Yes RLE Weight Bearing Per Provider Order: Non weight bearing     Mobility  Bed Mobility Overal bed mobility: Modified Independent             General bed mobility comments: able to return to supine    Transfers Overall transfer level: Needs assistance Equipment used: Rolling walker (2 wheels) Transfers: Sit to/from Stand, Bed to chair/wheelchair/BSC       Squat pivot transfers: Max assist     General transfer comment: pt with increased fatigue at end of session and requires increased cuing and assistance for pivoting on L foot from wheelchair back to bed            Wheelchair Mobility Wheelchair Mobility Wheelchair mobility: Yes Wheelchair propulsion: Both upper extremities, Right lower extremity Wheelchair parts: Supervision/cueing Distance: able to propel a maximum of 100 feet at a time before requiring increased rest break, shorter distances with propulsion over uneven surfaces outside. Wheelchair Assistance Details (indicate cue type and reason): Acute Rehab department does not have a wheelchair suitable to fit pt height for good use of LLE         Balance Overall balance assessment: Needs assistance Sitting-balance support: No upper extremity supported, Feet supported Sitting balance-Leahy Scale: Good                                      Communication Communication Communication: Impaired Factors Affecting Communication: Difficulty expressing self  Cognition Arousal: Alert Behavior During Therapy: Flat affect   PT - Cognitive impairments: Awareness, Problem solving  PT - Cognition Comments: difficulty with carry over off tasks from prior session yesterday Following commands: Intact Following commands impaired: Follows one step commands with increased time    Cueing Cueing Techniques: Verbal cues  Exercises Other Exercises Other Exercises: 5 chair push up     General Comments General comments (skin integrity, edema, etc.): pt with increased fatigue today after taking shower and taking wheelchair outside      Pertinent Vitals/Pain Pain Assessment Pain Assessment: No/denies pain     PT Goals (current goals can now be found in the care plan section) Acute Rehab PT Goals PT Goal Formulation: With patient Time For Goal Achievement: 12/29/23 Potential to Achieve Goals: Good Progress towards PT goals: Progressing toward goals    Frequency    Min 2X/week       Co-evaluation   Reason for Co-Treatment: To address functional/ADL transfers;Other (comment) (for initial STAR visit) PT goals addressed during session: Mobility/safety with mobility OT goals addressed during session: ADL's and self-care      AM-PAC PT 6 Clicks Mobility   Outcome Measure  Help needed turning from your back to your side while in a flat bed without using bedrails?: None Help needed moving from lying on your back to sitting on the side of a flat bed without using bedrails?: None Help needed moving to and from a bed to a chair (including a wheelchair)?: A Lot Help needed standing up from a chair using your arms (e.g., wheelchair or bedside chair)?: Total Help needed to walk in hospital room?: A Lot Help needed climbing 3-5 steps with a railing? : Total 6 Click Score: 14    End of Session   Activity Tolerance: Patient tolerated treatment well;Patient limited by fatigue Patient left: in bed;with call bell/phone within reach;with bed alarm set Nurse Communication: Mobility status PT Visit Diagnosis: Unsteadiness on feet (R26.81);Difficulty in walking, not elsewhere classified (R26.2);Other abnormalities of gait and mobility (R26.89);Muscle weakness (generalized) (M62.81)     Time: 8969-8885 PT Time Calculation (min) (ACUTE ONLY): 44 min  Charges:    $Therapeutic Activity: 8-22 mins $Wheel Chair Management: 23-37 mins PT General Charges $$ ACUTE PT  VISIT: 1 Visit                     Cletus Paris B. [name] PT, DPT Acute Rehabilitation Services Please use secure chat or  Call Office [phone]    [name] Fleeta [facility] 12/20/2023, 11:42 AM

## 2023-12-20 NOTE — Progress Notes (Signed)
 Occupational Therapy Treatment Patient Details Name: Derek Thompson MRN: [id number] DOB: 06-06-1960 Today's Date: 12/20/2023   History of present illness Pt is a 63 y/o M presenting to ED on 11/02/23 with lethargy. Pt found to be tachycardic, hyperkalemic, and hyponatremic. 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   OT comments  STAR Program OT session: Patient educated on squat pivot transfer to wheelchair with max assist. Patient able to doff clothing seated in recliner and was max assist for squat pivot transfer to shower chair. Patient able to bathe self seated on shower chair but completed bottom with max assist standing after transfer to wheelchair. Patient would benefit from tub bench next time shower is addressed to allow more room for lateral leaning for cleaning. Patient performed dressing seated in wheelchair requiring max assist to pull up clothing while standing. Patient appeared fatigued following shower. Acute OT to continue to follow with STAR program to address established goals.       If plan is discharge home, recommend the following:  A little help with walking and/or transfers;A little help with bathing/dressing/bathroom;Assistance with cooking/housework;Direct supervision/assist for medications management;Direct supervision/assist for financial management;Assist for transportation;Help with stairs or ramp for entrance   Equipment Recommendations  Other (comment) (defer)    Recommendations for Other Services      Precautions / Restrictions Precautions Precautions: Fall;Other (comment) Recall of Precautions/Restrictions: Impaired Required Braces or Orthoses: Other Brace Other Brace: R limb guard  protector Restrictions Weight Bearing Restrictions Per Provider Order: Yes RLE Weight Bearing Per Provider Order: Non weight bearing       Mobility Bed Mobility Overal bed mobility: Modified Independent Bed Mobility: Supine to Sit     Supine to sit: Modified independent (Device/Increase time), Used rails, HOB elevated     General bed mobility comments: able toget to EOB without assistance    Transfers Overall transfer level: Needs assistance Equipment used: Rolling walker (2 wheels) Transfers: Sit to/from Stand, Bed to chair/wheelchair/BSC Sit to Stand: Min assist   Squat pivot transfers: Max assist       General transfer comment: squat pivot transfer to wheelchair and shower chair. Stood from wheelchair for Scientific laboratory technician Overall balance assessment: Needs assistance Sitting-balance support: No upper extremity supported, Feet supported Sitting balance-Leahy Scale: Good   Postural control: Posterior lean Standing balance support: Bilateral upper extremity supported, During functional activity, Reliant on assistive device for balance Standing balance-Leahy Scale: Poor Standing balance comment: heavy reliance on rolling walker for support                           ADL either performed or assessed with clinical judgement   ADL Overall ADL's : Needs assistance/impaired         Upper Body Bathing: Set up;Sitting Upper Body Bathing Details (indicate cue type and reason): in shower Lower Body Bathing: Moderate assistance;Sitting/lateral leans;Sit to/from stand Lower Body Bathing Details (indicate cue type and reason): able to bathe peri area front and LEs seated and max assist for peri area back when standing Upper Body Dressing : Set up;Sitting   Lower Body Dressing: Moderate assistance;Maximal assistance;Sit to/from stand Lower Body Dressing Details (indicate cue type and reason): able  to thread legs into clothing but requires  max assist to pull up when standing Toilet Transfer: Moderate assistance;Rolling walker (2 wheels)             General ADL Comments: Patient quickly fatigues with shower and dressing    Extremity/Trunk Assessment              Vision       Perception     Praxis     Communication Communication Communication: Impaired Factors Affecting Communication: Difficulty expressing self   Cognition Arousal: Alert Behavior During Therapy: Flat affect Cognition: Cognition impaired     Awareness: Intellectual awareness intact, Online awareness impaired     Executive functioning impairment (select all impairments): Problem solving                   Following commands: Intact Following commands impaired: Follows one step commands with increased time      Cueing   Cueing Techniques: Verbal cues  Exercises      Shoulder Instructions       General Comments pt with increased fatigue today after taking shower and taking wheelchair outside    Pertinent Vitals/ Pain       Pain Assessment Pain Assessment: No/denies pain  Home Living                                          Prior Functioning/Environment              Frequency  Min 2X/week (on STAR Program)        Progress Toward Goals  OT Goals(current goals can now be found in the care plan section)  Progress towards OT goals: Progressing toward goals  Acute Rehab OT Goals Patient Stated Goal: get more independent OT Goal Formulation: With patient Time For Goal Achievement: 01/03/24 Potential to Achieve Goals: Fair ADL Goals Pt Will Perform Grooming: with supervision;standing Pt Will Perform Lower Body Bathing: with set-up;sitting/lateral leans Pt Will Perform Upper Body Dressing: with set-up;sitting Pt Will Perform Lower Body Dressing: with set-up;sitting/lateral leans Pt Will Transfer to Toilet: with supervision;ambulating;bedside commode Pt Will Perform Toileting -  Clothing Manipulation and hygiene: with set-up;sitting/lateral leans Pt/caregiver will Perform Home Exercise Program: Increased strength;Both right and left upper extremity;With theraband;With theraputty;Independently;With written HEP provided Additional ADL Goal #1: Pt will be able to follow 1 step commands with >50% accuracy to maximize participation in ADLs  Plan      Co-evaluation      Reason for Co-Treatment: To address functional/ADL transfers;Other (comment) (for initial STAR visit) PT goals addressed during session: Mobility/safety with mobility OT goals addressed during session: ADL's and self-care      AM-PAC OT 6 Clicks Daily Activity     Outcome Measure   Help from another person eating meals?: None Help from another person taking care of personal grooming?: A Little Help from another person toileting, which includes using toliet, bedpan, or urinal?: A Little Help from another person bathing (including washing, rinsing, drying)?: A Little Help from another person to put on and taking off regular upper body clothing?: A Little Help from another person to put on and taking off regular lower body clothing?: A Little 6 Click Score: 19    End of Session Equipment Utilized During Treatment: Gait belt;Rolling walker (2 wheels);Other (comment) (wheelchair)  OT Visit Diagnosis: Unsteadiness on feet (R26.81);Other abnormalities of gait and  mobility (R26.89);Muscle weakness (generalized) (M62.81);Other symptoms and signs involving cognitive function   Activity Tolerance Patient tolerated treatment well   Patient Left in chair;with call bell/phone within reach;Other (comment) (left with PT)   Nurse Communication Mobility status        Time: 9069-8981 OT Time Calculation (min): 48 min  Charges: OT General Charges $OT Visit: 1 Visit OT Treatments $Self Care/Home Management : 38-52 mins  Dick Laine, OTA Acute Rehabilitation Services  Office [phone]   Jeb LITTIE Laine 12/20/2023, 1:40 PM

## 2023-12-20 NOTE — Inpatient Diabetes Management (Signed)
 Inpatient Diabetes Program Recommendations  AACE/ADA: New Consensus Statement on Inpatient Glycemic Control (2015)  Target Ranges:  Prepandial:   less than 140 mg/dL      Peak postprandial:   less than 180 mg/dL (1-2 hours)      Critically ill patients:  140 - 180 mg/dL    Latest Reference Range & Units 12/19/23 07:19 12/19/23 11:21 12/19/23 16:33 12/19/23 20:54  Glucose-Capillary 70 - 99 mg/dL 865 (H) 850 (H) 823 (H) 140 (H)  (H): Data is abnormally high  Latest Reference Range & Units 12/20/23 07:51 12/20/23 11:10  Glucose-Capillary 70 - 99 mg/dL 794 (H) 785 (H)  (H): Data is abnormally high   Home DM Meds: Farxiga  10 mg Daily Metformin  1000 mg Daily   Current Orders: Lantus  10 units daily Farxiga  10 mg daily Novolog  Sensitive Correction Scale/ SSI (0-9 units) TID AC + HS    MD- Note Lantus  started 08/18.  CBGs yesterday were OK.  CBGs >200 today.  May consider increasing the Lantus  slightly to 12 units daily    Discharge Recommendations: Long acting recommendations: Insulin  Glargine (LANTUS ) Solostar Pen dose TBD at discharge  Supply/Referral recommendations: Pen needles - standard   Use Adult Diabetes Insulin  Treatment Post Discharge order set.    --Will follow patient during hospitalization--  Adina Rudolpho Arrow RN, MSN, CDCES Diabetes Coordinator Inpatient Glycemic Control Team Team Pager: [phone] (8a-5p)

## 2023-12-20 NOTE — Plan of Care (Signed)
  Problem: Activity: Goal: Risk for activity intolerance will decrease Outcome: Progressing   Problem: Coping: Goal: Level of anxiety will decrease Outcome: Progressing   Problem: Elimination: Goal: Will not experience complications related to bowel motility Outcome: Progressing Goal: Will not experience complications related to urinary retention Outcome: Progressing   Problem: Education: Goal: Individualized Educational Video(s) Outcome: Progressing   Problem: Coping: Goal: Ability to adjust to condition or change in health will improve Outcome: Progressing

## 2023-12-20 NOTE — Progress Notes (Signed)
 HD#47 SUBJECTIVE:  Patient Summary: Derek Thompson is a 63 y.o. male with past medical history of HFrEF, urinary retention with chronic Foley, chronic osteomyelitis of right foot who presented with lethargy. He is now status post right BKA and now medically ready for discharge awaiting safe placement  Overnight Events: N/A  Interim History:   CSW spoke to patient about discharge plan upon complete the STAR program.  CSW asked if the plan was for the patient to go back to the [facility] center and the pt agreed. CSW mentioned that we would have to touch basis with the VA representative Derek Thompson so that VA could approve the pt's stay at the servant center. The pt mentioned that I may need to get in touch with Derek Thompson the coordinator at the [facility].  CSW will continue to follow.  OBJECTIVE:  Vital Signs: Vitals:   12/19/23 2055 12/20/23 0433 12/20/23 0750 12/20/23 1708  BP: (!) 97/54 111/65 108/69 (!) 112/55  Pulse: 100 98 99 92  Resp: 18 18 19 19   Temp: 98.3 F (36.8 C) 98.3 F (36.8 C) 98.3 F (36.8 C) 98.6 F (37 C)  TempSrc:      SpO2: 98% 97% 98% 100%  Weight:      Height:       Supplemental O2: Room Air SpO2: 100 % O2 Flow Rate (L/min): 5 L/min  Filed Weights   12/11/23 0500 12/14/23 0500 12/19/23 0500  Weight: 62.1 kg 61.7 kg 62.7 kg     Intake/Output Summary (Last 24 hours) at 12/20/2023 1731 Last data filed at 12/20/2023 1556 Gross per 24 hour  Intake 620 ml  Output 1475 ml  Net -855 ml   Net IO Since Admission: -44,711.5 mL [12/20/23 1731]  Physical Exam: Physical Exam Constitutional: well-appearing in bed, no acute distress Cardiovascular: Regular rate and rhythm, no murmurs, rubs, or gallops Pulmonary/Chest: normal work of breathing on room air, lungs clear to auscultation bilaterally Abdominal: soft, non-tender, non-distended MSK: Right BKA appreciated, staples removed and surgical site healing well Patient Lines/Drains/Airways Status      Active Line/Drains/Airways     Name Placement date Placement time Site Days   Peripheral IV 12/14/23 22 G 1 Left;Lateral;Posterior Forearm 12/14/23  0747  Forearm  6   Urethral Catheter Mekides Nida, RN Coude 16 Fr. 12/08/23  1826  Coude  12   Wound 11/17/23 1030 Surgical Closed Surgical Incision Knee Right 11/17/23  1030  Knee  33            Pertinent labs and imaging:      Latest Ref Rng & Units 12/14/2023    4:34 AM 12/10/2023    7:33 AM 12/09/2023    4:34 AM  CBC  WBC 4.0 - 10.5 K/uL 6.3  5.9  5.6   Hemoglobin 13.0 - 17.0 g/dL 8.5  8.3  7.8   Hematocrit 39.0 - 52.0 % 27.6  26.6  24.7   Platelets 150 - 400 K/uL 467  435  415        Latest Ref Rng & Units 12/16/2023    6:28 AM 12/14/2023    4:34 AM 12/12/2023    5:05 AM  CMP  Glucose 70 - 99 mg/dL 796  728  897   BUN 8 - 23 mg/dL 63  63  63   Creatinine 0.61 - 1.24 mg/dL 7.13  6.97  7.11   Sodium 135 - 145 mmol/L 133  132  131   Potassium 3.5 - 5.1  mmol/L 4.9  4.8  4.9   Chloride 98 - 111 mmol/L 96  95  92   CO2 22 - 32 mmol/L 28  25  28    Calcium  8.9 - 10.3 mg/dL 8.6  8.6  8.5     No results found.  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 male with past medical history of HFrEF, urinary retention with chronic Foley, chronic  osteomyelitis of right foot who presented with concerns of lethargy found to have chronic osteo.  He is now status post right BKA and now medically ready for discharge awaiting placement. The patient's SNF benefits through BCBS have been exhausted, and MFA facilities (including [facility], [facility], and [facility]) are no longer able to offer placement due to recurrent insurance issues and risk of future coverage loss. CSW reviewed this with the patient and discussed next steps, including waiting for Medicaid approval or attempting to rehab in the hospital with possible discharge back to the [facility]. The patient is open to hospital rehab, and the [facility] confirmed he may return if independent with ADLs and approved again by the [place].    #Status post BKA of right foot #Chronic osteomyelitis of right foot, resolved Patient doing well postop.  No signs of further infection or complications.  He has been working with PT/OT in progressing well.  Patient is currently medically discharged and awaiting SNF placement.  -Monitor for signs of infection - PT/OT following - To be fit for a prosthesis, the residual limb must be fully healed, typically around 3 months post-op, usually as an outpatient. Prosthetic fitting and training depend on the Prosthetist and are rarely reimbursed under Medicare Part A.   #Type 2 diabetes mellitus The patient was previously on metformin , glargine, and short-acting insulin . Due to episodes of hypoglycemia, insulin  was discontinued. Metformin  was also stopped because of reduced kidney function and low GFR. However, blood sugars have been elevated (250-300) over the past 2-3 days. The plan is to start glargine 10 units daily, monitor blood sugars regularly, and maintain dietary management. The most recent blood glucose was 102, with no hypoglycemic episodes reported. -Continue holding NovoLog  -Follow up on A1c as outpatient   #AKI on suspected  CKD #Hyponatremia #Hyperkalemia Improved. Most recent creatinine at 2.8. Sodium seems to be stable.  Last potassium at 4.8.   - Monitor BMP - hold metformin  (GFR:22)   #Concern for multiple myeloma Patient had SPEP with positive M spike as well as elevated protein gap.  Bone marrow biopsy finding concerns of multiple myeloma. Can follow-up outpatient. - Consulted oncology during hospital stay who will follow-up with patient outpatient - Monitor renal function and support electrolyte status   #HFrEF Stable. echo 10/12/2023 showed EF of 20 to 25%.  Patient seems  euvolemic on exam.  Patient is now on metoprolol  succinate 50 mg daily.  No acute concern for exacerbation at this time.  -Continue metoprolol  succinate 50 mg daily - Renal function will not allow for spironolactone , Entresto , or SGLT2 at this time   #History of LV thrombus #New subscapular splenic infarct - No acute concerns at this time - Continue Eliquis  5 mg twice daily   #CAUTI, resolved Most recent catheter change on 12/08/2023.  No acute concerns at this time as infection has resolved. -Continue routine catheter care     Diet: Normal IVF: None,None VTE: Eliquise Code: Full  TOC recs: The patient's SNF benefits through BCBS have been exhausted, and MFA facilities (including [place], [facility], and [facility]) are no longer able to offer placement due to recurrent insurance issues and risk of future coverage loss. CSW reviewed this with the patient and discussed next steps, including waiting for Medicaid approval or attempting to rehab in the hospital with possible discharge back to the [facility]. The patient is open to hospital rehab, and the [facility] confirmed he may return if independent with ADLs and approved again by the [place]. Dispo: STAR CSW spoke to patient about discharge plan upon complete the STAR program.  CSW asked if the plan was for the patient to go back to the servant center and the pt agreed.  CSW mentioned that we would have to touch basis with the VA representative Derek Thompson so that VA could approve the pt's stay at the servant center. The pt mentioned that I may need to get in touch with Derek Thompson the coordinator at the [facility].  CSW will continue to follow.  Signature:  Heike Pounds Bernadine [facility]  5:31 PM, 12/20/2023  On Call pager [phone]

## 2023-12-21 LAB — GLUCOSE, CAPILLARY
Glucose-Capillary: 171 mg/dL — ABNORMAL HIGH (ref 70–99)
Glucose-Capillary: 204 mg/dL — ABNORMAL HIGH (ref 70–99)
Glucose-Capillary: 80 mg/dL (ref 70–99)
Glucose-Capillary: 126 mg/dL — ABNORMAL HIGH (ref 70–99)

## 2023-12-21 MED ORDER — INSULIN ASPART 100 UNIT/ML IJ SOLN
0.0000 [IU] | Freq: Three times a day (TID) | INTRAMUSCULAR | Status: DC
  Administered 2023-12-21: 5 [IU] via SUBCUTANEOUS
  Administered 2023-12-22 – 2023-12-23 (×4): 3 [IU] via SUBCUTANEOUS
  Administered 2023-12-23 – 2023-12-24 (×2): 5 [IU] via SUBCUTANEOUS
  Administered 2023-12-24 (×2): 3 [IU] via SUBCUTANEOUS
  Administered 2023-12-25: 8 [IU] via SUBCUTANEOUS
  Administered 2023-12-25: 3 [IU] via SUBCUTANEOUS

## 2023-12-21 MED ORDER — INSULIN GLARGINE 100 UNIT/ML ~~LOC~~ SOLN
12.0000 [IU] | Freq: Every day | SUBCUTANEOUS | Status: DC
  Administered 2023-12-21 – 2023-12-22 (×2): 12 [IU] via SUBCUTANEOUS
  Filled 2023-12-21 (×2): qty 0.12

## 2023-12-21 NOTE — Progress Notes (Signed)
 Orthopedic Tech Progress Note Patient Details:  Derek Thompson 01-Sep-1960 [id number]  Order for a Vive Protocol BK (ampushield/shrinker/education) was communicated with Gl[place] from the [facility].  Patient ID: [name], male   DOB: Dec 09, 1960, 63 y.o.   MRN: [id number]  Tinnie [name] Brasil 12/21/2023, 2:16 PM

## 2023-12-21 NOTE — Progress Notes (Signed)
 HD#48 SUBJECTIVE:  Patient Summary:  Derek Thompson is a 63 y.o. male with past medical history of HFrEF, urinary retention with chronic Foley, chronic osteomyelitis of right foot who presented with lethargy. He is now status post right BKA and now medically ready for discharge awaiting safe placement. Overnight Events: N/A  Interim History: Patient was outside for physical therapy(STAR patient) OBJECTIVE:  Vital Signs: Vitals:   12/20/23 0750 12/20/23 1708 12/20/23 2041 12/21/23 0402  BP: 108/69 (!) 112/55 100/66 110/73  Pulse: 99 92 92 91  Resp: 19 19 17 16   Temp: 98.3 F (36.8 C) 98.6 F (37 C) 99.1 F (37.3 C) 97.7 F (36.5 C)  TempSrc:   Oral Oral  SpO2: 98% 100% 99% 97%  Weight:      Height:       Supplemental O2: Room Air SpO2: 97 % O2 Flow Rate (L/min): 5 L/min  Filed Weights   12/11/23 0500 12/14/23 0500 12/19/23 0500  Weight: 62.1 kg 61.7 kg 62.7 kg     Intake/Output Summary (Last 24 hours) at 12/21/2023 0731 Last data filed at 12/21/2023 0600 Gross per 24 hour  Intake 1174 ml  Output 1375 ml  Net -201 ml   Net IO Since Admission: -44,812.5 mL [12/21/23 0731]  Physical Exam: Constitutional: well-appearing in bed, no acute distress Cardiovascular: Regular rate and rhythm, no murmurs, rubs, or gallops Pulmonary/Chest: normal work of breathing on room air, lungs clear to auscultation bilaterally Abdominal: soft, non-tender, non-distended MSK: Right BKA appreciated, staples removed and surgical site healing well  Patient Lines/Drains/Airways Status     Active Line/Drains/Airways     Name Placement date Placement time Site Days   Peripheral IV 12/14/23 22 G 1 Left;Lateral;Posterior Forearm 12/14/23  0747  Forearm  7   Urethral Catheter Mekides Nida, RN Coude 16 Fr. 12/08/23  1826  Coude  13   Wound 11/17/23 1030 Surgical Closed Surgical Incision Knee Right 11/17/23  1030  Knee  34            Pertinent labs and imaging:      Latest Ref  Rng & Units 12/14/2023    4:34 AM 12/10/2023    7:33 AM 12/09/2023    4:34 AM  CBC  WBC 4.0 - 10.5 K/uL 6.3  5.9  5.6   Hemoglobin 13.0 - 17.0 g/dL 8.5  8.3  7.8   Hematocrit 39.0 - 52.0 % 27.6  26.6  24.7   Platelets 150 - 400 K/uL 467  435  415        Latest Ref Rng & Units 12/16/2023    6:28 AM 12/14/2023    4:34 AM 12/12/2023    5:05 AM  CMP  Glucose 70 - 99 mg/dL 796  728  897   BUN 8 - 23 mg/dL 63  63  63   Creatinine 0.61 - 1.24 mg/dL 7.13  6.97  7.11   Sodium 135 - 145 mmol/L 133  132  131   Potassium 3.5 - 5.1 mmol/L 4.9  4.8  4.9   Chloride 98 - 111 mmol/L 96  95  92   CO2 22 - 32 mmol/L 28  25  28    Calcium  8.9 - 10.3 mg/dL 8.6  8.6  8.5     No results found.  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 male with past medical history of HFrEF, urinary retention with chronic Foley, chronic osteomyelitis of right foot who presented with concerns of lethargy found to have chronic osteo.  He is now status post right BKA and now medically ready for discharge, STAR program patient. The patient's SNF benefits through BCBS have been exhausted, and MFA facilities (including [facility], [facility], and [facility]) are no longer able to offer placement due to recurrent insurance issues and risk of future  coverage loss. CSW reviewed this with the patient and discussed next steps, including waiting for Medicaid approval or attempting to rehab in the hospital with possible discharge back to the [facility]. The patient is open to hospital rehab, and the [facility] confirmed he may return if independent with ADLs and approved again by the [place].    #Status post BKA of right foot #Chronic osteomyelitis of right foot, resolved Patient doing well postop.  No signs of further infection or complications.  He has been working with PT/OT in progressing well.  Patient is currently medically discharged and awaiting SNF placement.  -Monitor for signs of infection - PT/OT following - To be fit for a prosthesis, the residual limb must be fully healed, typically around 3 months post-op, usually as an outpatient. Prosthetic fitting and training depend on the Prosthetist and are rarely reimbursed under Medicare Part A.   #Type 2 diabetes mellitus The patient was previously on metformin , glargine, and short-acting insulin . Due to episodes of hypoglycemia, insulin  was discontinued. Metformin  was also stopped because of reduced kidney function and low GFR. However, blood sugars have been elevated (250-300) over the past 2-3 days. The plan is to start glargine 10 units daily, monitor blood sugars regularly, and maintain dietary management. The most recent blood glucose was 102, with no hypoglycemic episodes reported. -Continue holding NovoLog  -Follow up on A1c as outpatient   #AKI on suspected CKD #Hyponatremia #Hyperkalemia Improved. Most recent creatinine at 2.8. Sodium seems to be stable.  Last potassium at 4.8.   - Monitor BMP - hold metformin  (GFR:22)   #Concern for multiple myeloma Patient had SPEP with positive M spike as well as elevated protein gap.  Bone marrow biopsy finding concerns of multiple myeloma. Can follow-up outpatient. - Consulted oncology during hospital stay who will follow-up with  patient outpatient - Monitor renal function and support electrolyte status   #HFrEF Stable. echo 10/12/2023 showed EF of 20 to 25%.  Patient seems euvolemic on exam.  Patient is now on metoprolol  succinate 50 mg daily.  No acute concern for exacerbation at this time.  -Continue metoprolol  succinate 50 mg daily - Renal function will not allow for spironolactone , Entresto , or SGLT2 at this time   #History of LV thrombus #New subscapular splenic infarct - No acute concerns at this time - Continue Eliquis  5 mg twice daily   #CAUTI, resolved Most recent catheter change on 12/08/2023.  No acute concerns at this time  as infection has resolved. -Continue routine catheter care Signature:  Nell Gales [facility]  7:31 AM, 12/21/2023  On Call pager [phone]

## 2023-12-21 NOTE — TOC Progression Note (Signed)
 Transition of Care [facility]) - Progression Note    Patient Details  Name: Derek Thompson MRN: [id number] Date of Birth: October 12, 1960  Transition of Care [facility]) CM/SW Contact  [name] [place], [name] Phone Number: 12/21/2023, 4:02 PM  Clinical Narrative:     CSW spoke with Asberry Day with [facility] Screening. She state pt is approved with a 32day [facility] SNF contracts. This contract would only be valid with 9 SNF's across the state. CSW sent referrals to the followwing SNF's  [facility]declined bed offer [facility]- declined bed offer [facility] and rehab- pending  [facility]- pending [facility]- pending [facility]- pending [facility] [place]-pending [facility] [place]-pending  Expected Discharge Plan: Skilled Nursing Facility Barriers to Discharge: Air traffic controller and Services In-house Referral: Clinical Social Work   Post Acute Care Choice: Skilled Nursing Facility Living arrangements for the past 2 months: Homeless Shelter                                       Social Drivers of Health (SDOH) Interventions SDOH Screenings   Food Insecurity: No Food Insecurity (11/02/2023)  Housing: High Risk (11/02/2023)  Transportation Needs: Unmet Transportation Needs (11/02/2023)  Utilities: Not At Risk (11/02/2023)  Alcohol Screen: Low Risk  (08/19/2022)  Depression (PHQ2-9): Low Risk  (10/11/2023)  Financial Resource Strain: Low Risk  (08/19/2022)  Tobacco Use: Medium Risk (11/17/2023)    Readmission Risk Interventions     No data to display

## 2023-12-21 NOTE — Progress Notes (Signed)
 Occupational Therapy Evaluation Patient Details Name: Derek Thompson MRN: [id number] DOB: 06/11/1960 Today's Date: 12/21/2023   History of Present Illness   Pt is a 63 y/o M presenting to ED on 11/02/23 with lethargy. Pt found to be tachycardic, hyperkalemic, and hyponatremic. 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     Clinical Impressions STAR OT/PT session: Patient educated on pivot transfer to [facility] over toilet with use of grab bars and mod assist. Patient performed toilet hygiene seated and required assistance to complete while standing. Patient able to thread legs into clothing while seated and required assistance to pull up while standing. Patient performed wheelchair mobility to increase activity tolerance. Discharge recommendations continue to be appropriate. Acute OT to continue to follow to address established goals.      If plan is discharge home, recommend the following:   A little help with walking and/or transfers;A little help with bathing/dressing/bathroom;Assistance with cooking/housework;Direct supervision/assist for medications management;Direct supervision/assist for financial management;Assist for transportation;Help with stairs or ramp for entrance     Functional Status Assessment         Equipment Recommendations   Other (comment) (defer)     Recommendations for Other Services         Precautions/Restrictions   Precautions Precautions: Fall;Other (comment) Recall of Precautions/Restrictions: Impaired Other Brace: Dr Harden discontinued limb protector Restrictions Weight Bearing Restrictions Per Provider Order: Yes RLE Weight Bearing Per Provider Order: Non weight bearing      Mobility Bed Mobility Overal bed mobility: Modified Independent             General bed mobility comments: slight use of bed rail to pull to EoB    Transfers Overall transfer level: Needs assistance Equipment used: 1 person hand held assist       Squat pivot transfers: Min assist, Mod assist       General transfer comment: education on wheelchair positioning and techniques for squat pivot trasnfer      Balance Overall balance assessment: Needs assistance Sitting-balance support: No upper extremity supported, Feet supported Sitting balance-Leahy Scale: Good     Standing balance support: Bilateral upper extremity supported, During functional activity, Reliant on assistive device for balance Standing balance-Leahy Scale: Poor Standing balance comment: reliant on external support for standing                           ADL either performed or assessed with clinical judgement   ADL Overall ADL's : Needs assistance/impaired     Grooming: Wash/dry hands;Set up;Sitting               Lower Body Dressing: Moderate assistance;Sit to/from stand Lower Body Dressing Details (indicate cue type and reason): patient able to thread legs into clothing and mod assist to pull up while standing Toilet Transfer: Moderate assistance;Regular Toilet;BSC/3in1;Stand-pivot;Grab bars Toilet Transfer Details (indicate cue type and reason): BSC over toilet with use of grab bars and mod assists to pivot with verbal cues throughout Toileting- Clothing Manipulation and Hygiene: Moderate assistance;Sit to/from stand Toileting - Clothing Manipulation Details (indicate cue type and reason): patient performed toilet hygiene seated and stood for assistance to complete             Vision  Perception         Praxis         Pertinent Vitals/Pain Pain Assessment Pain Assessment: No/denies pain     Extremity/Trunk Assessment             Communication  Communication Communication: Impaired Factors Affecting Communication: Difficulty expressing self   Cognition Arousal: Alert Behavior During Therapy: Flat affect       Awareness: Intellectual awareness intact, Online awareness impaired     Executive functioning impairment (select all impairments): Problem solving                   Following commands: Intact Following commands impaired: Follows one step commands with increased time     Cueing  General Comments   Cueing Techniques: Verbal cues  requires frequent rest breaks during self care and wheelchair mobility   Exercises Exercises: Other exercises Other Exercises Other Exercises: wheelchair mobility on different surfaces   Shoulder Instructions      Home Living Family/patient expects to be discharged to:: Skilled nursing facility                                        Prior Functioning/Environment                      OT Problem List:     OT Treatment/Interventions:        OT Goals(Current goals can be found in the care plan section)   Acute Rehab OT Goals Patient Stated Goal: to increase independence with self care OT Goal Formulation: With patient Time For Goal Achievement: 01/03/24 Potential to Achieve Goals: Fair ADL Goals Pt Will Perform Grooming: with supervision;standing Pt Will Perform Lower Body Bathing: with set-up;sitting/lateral leans Pt Will Perform Upper Body Dressing: with set-up;sitting Pt Will Perform Lower Body Dressing: with set-up;sitting/lateral leans Pt Will Transfer to Toilet: with supervision;ambulating;bedside commode Pt Will Perform Toileting - Clothing Manipulation and hygiene: with set-up;sitting/lateral leans Pt/caregiver will Perform Home Exercise Program: Increased strength;Both right and left upper extremity;With theraband;With theraputty;Independently;With written HEP provided Additional ADL Goal #1: Pt will be able to follow 1 step  commands with >50% accuracy to maximize participation in ADLs   OT Frequency:  Min 2X/week (on STAR Program)    Co-evaluation PT/OT/SLP Co-Evaluation/Treatment: Yes Reason for Co-Treatment: To address functional/ADL transfers;Other (comment) PT goals addressed during session: Mobility/safety with mobility OT goals addressed during session: ADL's and self-care      AM-PAC OT 6 Clicks Daily Activity     Outcome Measure Help from another person eating meals?: None Help from another person taking care of personal grooming?: A Little Help from another person toileting, which includes using toliet, bedpan, or urinal?: A Lot Help from another person bathing (including washing, rinsing, drying)?: A Little Help from another person to put on and taking off regular upper body clothing?: A Little Help from another person to put on and taking off regular lower body clothing?: A Lot 6 Click Score: 17   End of Session Equipment Utilized During Treatment: Gait belt;Other (comment) (wheelchair) Nurse Communication: Mobility status  Activity Tolerance: Patient tolerated treatment well Patient left: in chair;with call bell/phone within reach  OT Visit Diagnosis: Unsteadiness on feet (R26.81);Other abnormalities of gait and mobility (R26.89);Muscle weakness (generalized) (M62.81);Other symptoms and signs involving cognitive function  Time: 8952-8843 OT Time Calculation (min): 69 min Charges:  OT General Charges $OT Visit: 1 Visit OT Treatments $Self Care/Home Management : 23-37 mins  Dick Laine, OTA Acute Rehabilitation Services  Office [phone]   Jeb LITTIE Laine 12/21/2023, 3:02 PM

## 2023-12-21 NOTE — Progress Notes (Signed)
 Physical Therapy Treatment Patient Details Name: Derek Thompson MRN: [id number] DOB: Dec 16, 1960 Today's Date: 12/21/2023   History of Present Illness Pt is a 63 y/o M presenting to ED on 11/02/23 with lethargy. Pt found to be tachycardic, hyperkalemic, and hyponatremic. 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    PT Comments  STAR PT/OT Session: Focus of session dressing and self care in addition to wheelchair transfers, leg rest management and wheelchair propulsion. Pt is making progress, but continues to be limited in safe mobility by increased fatigue and decreased strength and endurance. Pt is min-modA for his transfers in and out of the wheelchair. Pt able to propel wheelchair with B UE and somewhat with L LE. Discussed efficiency of using LLE versus UE. Continue to work on getting pt his wheelchair for training. Have also asked for pt to get shrinkers to prepare R residual limb for prosthetic.    If plan is discharge home, recommend the following: A lot of help with bathing/dressing/bathroom;Supervision due to cognitive status;Help with stairs or ramp for entrance;Assistance with cooking/housework;Assist for transportation;A little help with walking and/or transfers   Can travel by private vehicle     Yes  Equipment Recommendations  BSC/3in1;Wheelchair (measurements PT);Wheelchair cushion (measurements PT);Other (comment) (needs to be fitted for wheelchair given his height)       Precautions / Restrictions Precautions Precautions: Fall;Other (comment) Recall of Precautions/Restrictions: Impaired Other Brace: Dr Harden discontinued limb protector Restrictions Weight Bearing Restrictions Per Provider Order: Yes RLE Weight  Bearing Per Provider Order: Non weight bearing     Mobility  Bed Mobility Overal bed mobility: Modified Independent             General bed mobility comments: slight use of bed rail to pull to EoB    Transfers Overall transfer level: Needs assistance Equipment used: 1 person hand held assist         Squat pivot transfers: Min assist, Mod assist     General transfer comment: pt able perform squat pivot transfers from bed to chair, chair <>BSC, wheelchair to recliner. Pt requires increased cuing for hand and foot placement for most efficient transfer in order to conserve energy      Corporate treasurer Wheelchair mobility: Yes Wheelchair propulsion: Both upper extremities, Right lower extremity Wheelchair parts: Needs assistance Distance: 30-40 feet at a time before needing assistance due to fatigue Wheelchair Assistance Details (indicate cue type and reason): pt able to propel himself short distances 30-40 feet over level ground with supervision but tires easily, pt educated on placement of L foot rest and reminders about brake application with all transfers and even with static sitting for safety. Pt would benefit from higher wheelchair to maximize use of L LE       Balance Overall balance assessment: Needs assistance Sitting-balance support: No upper extremity supported, Feet supported Sitting balance-Leahy Scale: Good                                      Communication Communication Communication: Impaired Factors Affecting Communication: Difficulty expressing self  Cognition Arousal: Alert Behavior During Therapy: Flat affect   PT - Cognitive impairments: Awareness, Problem solving  PT - Cognition Comments: difficulty Following commands: Intact Following commands impaired: Follows one step commands with increased time    Cueing Cueing Techniques: Verbal cues  Exercises Amputee  Exercises Knee Flexion: AROM, Right, 20 reps Knee Extension: AROM, Right, 20 reps    General Comments General comments (skin integrity, edema, etc.): pt continues to fatigue easily needing frequent rest breaks. therapy continues to educated on most efficent transfers to assist      Pertinent Vitals/Pain Pain Assessment Pain Assessment: No/denies pain    Home Living Family/patient expects to be discharged to:: Skilled nursing facility                            PT Goals (current goals can now be found in the care plan section) Acute Rehab PT Goals PT Goal Formulation: With patient Time For Goal Achievement: 12/29/23 Potential to Achieve Goals: Good Progress towards PT goals: Progressing toward goals    Frequency    Min 2X/week      PT Plan      Co-evaluation PT/OT/SLP Co-Evaluation/Treatment: Yes Reason for Co-Treatment: To address functional/ADL transfers;Other (comment) (for initial STAR visit) PT goals addressed during session: Mobility/safety with mobility OT goals addressed during session: ADL's and self-care      AM-PAC PT 6 Clicks Mobility   Outcome Measure  Help needed turning from your back to your side while in a flat bed without using bedrails?: None Help needed moving from lying on your back to sitting on the side of a flat bed without using bedrails?: None Help needed moving to and from a bed to a chair (including a wheelchair)?: A Lot Help needed standing up from a chair using your arms (e.g., wheelchair or bedside chair)?: A Lot Help needed to walk in hospital room?: A Lot Help needed climbing 3-5 steps with a railing? : Total 6 Click Score: 15    End of Session   Activity Tolerance: Patient tolerated treatment well;Patient limited by fatigue Patient left: with call bell/phone within reach;in chair;with chair alarm set Nurse Communication: Mobility status PT Visit Diagnosis: Unsteadiness on feet (R26.81);Difficulty in walking, not  elsewhere classified (R26.2);Other abnormalities of gait and mobility (R26.89);Muscle weakness (generalized) (M62.81)     Time: 8953-8843 PT Time Calculation (min) (ACUTE ONLY): 70 min  Charges:    $Therapeutic Activity: 8-22 mins $Wheel Chair Management: 23-37 mins PT General Charges $$ ACUTE PT VISIT: 1 Visit                     Dafna Romo B. [name] PT, DPT Acute Rehabilitation Services Please use secure chat or  Call Office [phone]    [name] Fleeta [facility] 12/21/2023, 1:39 PM

## 2023-12-22 DIAGNOSIS — N179 Acute kidney failure, unspecified: Secondary | ICD-10-CM | POA: Diagnosis not present

## 2023-12-22 DIAGNOSIS — E1165 Type 2 diabetes mellitus with hyperglycemia: Secondary | ICD-10-CM | POA: Diagnosis not present

## 2023-12-22 DIAGNOSIS — M86171 Other acute osteomyelitis, right ankle and foot: Secondary | ICD-10-CM | POA: Diagnosis not present

## 2023-12-22 DIAGNOSIS — E1169 Type 2 diabetes mellitus with other specified complication: Secondary | ICD-10-CM | POA: Diagnosis not present

## 2023-12-22 LAB — GLUCOSE, CAPILLARY
Glucose-Capillary: 83 mg/dL (ref 70–99)
Glucose-Capillary: 181 mg/dL — ABNORMAL HIGH (ref 70–99)
Glucose-Capillary: 153 mg/dL — ABNORMAL HIGH (ref 70–99)
Glucose-Capillary: 147 mg/dL — ABNORMAL HIGH (ref 70–99)

## 2023-12-22 MED ORDER — INSULIN GLARGINE 100 UNIT/ML ~~LOC~~ SOLN
10.0000 [IU] | Freq: Every day | SUBCUTANEOUS | Status: DC
  Administered 2023-12-23: 10 [IU] via SUBCUTANEOUS
  Filled 2023-12-22 (×2): qty 0.1

## 2023-12-22 MED ORDER — CHLORHEXIDINE GLUCONATE CLOTH 2 % EX PADS
6.0000 | MEDICATED_PAD | Freq: Every day | CUTANEOUS | Status: DC
  Administered 2023-12-22 – 2023-12-23 (×2): 6 via TOPICAL

## 2023-12-22 NOTE — TOC Progression Note (Addendum)
 Transition of Care [facility]) - Progression Note    Patient Details  Name: Derek Thompson MRN: [id number] Date of Birth: Dec 05, 1960  Transition of [facility] CM/SW Contact  Lendia Dais, [place] Phone Number: 12/22/2023, 10:39 AM  Clinical Narrative:   CSW contacted the following facilities:  [facility]: CSW called and reached the VM of [facility]. CSW left a vm.  [facility]: CSW called no answer was given and phone hung up  [facility]: CSW called and LVM for [facility]: Spoke to Jasmine and she stated the fax was not received. CSW will refax referral.  [facility]: CSW lvm for admissions office.  1548 - CSW emailed referral to [facility].        Expected Discharge Plan: Skilled Nursing Facility Barriers to Discharge: Insurance Authorization               Expected Discharge Plan and Services In-house Referral: Clinical Social Work   Post Acute Care Choice: Skilled Nursing Facility Living arrangements for the past 2 months: Homeless Shelter                                       Social Drivers of Health (SDOH) Interventions SDOH Screenings   Food Insecurity: No Food Insecurity (11/02/2023)  Housing: High Risk (11/02/2023)  Transportation Needs: Unmet Transportation Needs (11/02/2023)  Utilities: Not At Risk (11/02/2023)  Alcohol Screen: Low Risk  (08/19/2022)  Depression (PHQ2-9): Low Risk  (10/11/2023)  Financial Resource Strain: Low Risk  (08/19/2022)  Tobacco Use: Medium Risk (11/17/2023)    Readmission Risk Interventions     No data to display

## 2023-12-22 NOTE — Progress Notes (Signed)
 Occupational Therapy Treatment Patient Details Name: Derek Thompson MRN: [id number] DOB: 05/02/1961 Today's Date: 12/22/2023   History of present illness Pt is a 63 y/o M presenting to ED on 11/02/23 with lethargy. Pt found to be tachycardic, hyperkalemic, and hyponatremic. 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   OT comments  STAR OT session: Patient continues to make gains with OT treatment. Patient performed bathing seated on EOB with patient able to doff clothing while seated with cues for doffing brief. Patient stood for assist to complete cleaning bottom and pulling up brief with mod assist. Patient able to pull up shorts with going to supine. Patient instructed on wheelchair setup with difficulty recalling from previous session. Patient instructed on squat pivot transfer but performed lateral scoot. Wheelchair mobility performed in hallway with cues for energy conservation. Patient to continue to be followed by acute OT with STAR program to address established goals.       If plan is discharge home, recommend the following:  A little help with walking and/or transfers;A little help with bathing/dressing/bathroom;Assistance with cooking/housework;Direct supervision/assist for medications management;Direct supervision/assist for financial management;Assist for transportation;Help with stairs or ramp for entrance   Equipment Recommendations  Other (comment) (defer)    Recommendations for Other Services      Precautions / Restrictions Precautions Precautions: Fall;Other (comment) Recall of Precautions/Restrictions: Impaired Other Brace: Dr Harden discontinued limb protector Restrictions Weight Bearing Restrictions Per  Provider Order: Yes RLE Weight Bearing Per Provider Order: Non weight bearing       Mobility Bed Mobility Overal bed mobility: Modified Independent             General bed mobility comments: no physical assistance needed to get to EOB or go to supine    Transfers Overall transfer level: Needs assistance Equipment used: None Transfers: Bed to chair/wheelchair/BSC, Sit to/from Stand Sit to Stand: Contact guard assist, From elevated surface          Lateral/Scoot Transfers: Contact guard assist General transfer comment: patient stood for self care task with RW and CGA from raised bed. Patient instructed in squat pivot transfer but performed lateral scoot     Balance Overall balance assessment: Needs assistance Sitting-balance support: No upper extremity supported, Feet supported Sitting balance-Leahy Scale: Good Sitting balance - Comments: able to perform self care tasks seated on EOB without LOB   Standing balance support: Single extremity supported, Bilateral upper extremity supported, During functional activity Standing balance-Leahy Scale: Poor Standing balance comment: able to assist with pulling up clothing with one extremity assist                           ADL either performed or assessed with clinical judgement   ADL Overall ADL's : Needs assistance/impaired     Grooming: Wash/dry hands;Wash/dry face;Oral care;Applying deodorant;Set up;Sitting   Upper Body Bathing: Set up;Sitting Upper Body Bathing Details (indicate cue type and reason): on EOB Lower Body Bathing: Moderate assistance;Sitting/lateral leans;Sit to/from stand Lower Body Bathing Details (indicate cue type and reason): mod assist to complete cleaning bottom when standing Upper Body Dressing : Set up;Sitting   Lower Body Dressing: Moderate assistance;Sit to/from stand Lower Body Dressing Details (indicate cue type and reason): patient able  to thread legs into clothing and mod assist to  pull up while standing               General ADL Comments: performed bathing and dressing seated on EOB, patient stood to pull up brief and pulled up shorts with supervision by going to supine    Extremity/Trunk Assessment              Vision       Perception     Praxis     Communication Communication Communication: Impaired Factors Affecting Communication: Difficulty expressing self   Cognition Arousal: Alert Behavior During Therapy: Flat affect       Awareness: Intellectual awareness intact, Online awareness impaired Memory impairment (select all impairments): Short-term memory   Executive functioning impairment (select all impairments): Problem solving OT - Cognition Comments: Difficulty recalling wheelchair setup for transfers                 Following commands: Intact Following commands impaired: Follows one step commands with increased time      Cueing   Cueing Techniques: Verbal cues  Exercises      Shoulder Instructions       General Comments wheelchair mobility in hallway with cues for energy conservation    Pertinent Vitals/ Pain       Pain Assessment Pain Assessment: No/denies pain  Home Living                                          Prior Functioning/Environment              Frequency  Min 2X/week (on STAR Program)        Progress Toward Goals  OT Goals(current goals can now be found in the care plan section)  Progress towards OT goals: Progressing toward goals  Acute Rehab OT Goals Patient Stated Goal: to increase independence OT Goal Formulation: With patient Time For Goal Achievement: 01/03/24 Potential to Achieve Goals: Fair ADL Goals Pt Will Perform Grooming: with supervision;standing Pt Will Perform Lower Body Bathing: with set-up;sitting/lateral leans Pt Will Perform Upper Body Dressing: with set-up;sitting Pt Will Perform Lower Body Dressing: with set-up;sitting/lateral leans Pt  Will Transfer to Toilet: with supervision;ambulating;bedside commode Pt Will Perform Toileting - Clothing Manipulation and hygiene: with set-up;sitting/lateral leans Pt/caregiver will Perform Home Exercise Program: Increased strength;Both right and left upper extremity;With theraband;With theraputty;Independently;With written HEP provided Additional ADL Goal #1: Pt will be able to follow 1 step commands with >50% accuracy to maximize participation in ADLs  Plan      Co-evaluation                 AM-PAC OT 6 Clicks Daily Activity     Outcome Measure   Help from another person eating meals?: None Help from another person taking care of personal grooming?: A Little Help from another person toileting, which includes using toliet, bedpan, or urinal?: A Lot Help from another person bathing (including washing, rinsing, drying)?: A Little Help from another person to put on and taking off regular upper body clothing?: A Little Help from another person to put on and taking off regular lower body clothing?: A Lot 6 Click Score: 17    End of Session Equipment Utilized During Treatment: Gait belt;Rolling walker (2 wheels);Other (comment) (wheelchair)  OT Visit Diagnosis: Unsteadiness on feet (R26.81);Other abnormalities of gait and mobility (R26.89);Muscle weakness (  generalized) (M62.81);Other symptoms and signs involving cognitive function   Activity Tolerance Patient tolerated treatment well   Patient Left in chair;with call bell/phone within reach (in wheelchair)   Nurse Communication Mobility status        Time: [phone] OT Time Calculation (min): 46 min  Charges: OT General Charges $OT Visit: 1 Visit OT Treatments $Self Care/Home Management : 23-37 mins $Therapeutic Activity: 8-22 mins  Dick Laine, OTA Acute Rehabilitation Services  Office [phone]   Jeb LITTIE Laine 12/22/2023, 8:24 AM

## 2023-12-22 NOTE — Progress Notes (Signed)
 HD#49 SUBJECTIVE:  Patient Summary:  Derek Thompson is a 63 y.o. male with past medical history of HFrEF, urinary retention with chronic Foley, chronic osteomyelitis of right foot who presented with lethargy. He is now status post right BKA and now medically ready for discharge awaiting safe placement. Overnight Events: N/A  Interim History: Patient reports that he does get nervous to try to do things like getting out of bed and transitioning to a wheelchair but states that he feels that as time goes on and he practices more, he feels more comfortable. Patient was informed that offers have been sent for some nursing facilities for him to go to but until then he is willing to continue to work with [facility].  OBJECTIVE:  Vital Signs: Vitals:   12/21/23 0953 12/21/23 1722 12/21/23 2005 12/22/23 0436  BP: 104/63 100/66 104/67 (!) 95/58  Pulse: 88 84 92 88  Resp: 20 19 18 17   Temp: 98.3 F (36.8 C) 98.4 F (36.9 C) 98.3 F (36.8 C) 98.1 F (36.7 C)  TempSrc:   Oral Oral  SpO2: 98% 97% 99% 97%  Weight:      Height:       Supplemental O2: Room Air SpO2: 97 % O2 Flow Rate (L/min): 5 L/min  Filed Weights   12/11/23 0500 12/14/23 0500 12/19/23 0500  Weight: 62.1 kg 61.7 kg 62.7 kg     Intake/Output Summary (Last 24 hours) at 12/22/2023 9356 Last data filed at 12/22/2023 0600 Gross per 24 hour  Intake 1317 ml  Output 1525 ml  Net -208 ml   Net IO Since Admission: -45,020.5 mL [12/22/23 0643]  Physical Exam: Constitutional: well-appearing in bed, no acute distress Pulmonary/Chest: normal work of breathing on room air,  MSK: Right BKA appreciated, staples removed and surgical site healing well  Patient Lines/Drains/Airways Status     Active Line/Drains/Airways     Name Placement date Placement time Site Days   Peripheral IV 12/14/23 22 G 1 Left;Lateral;Posterior Forearm 12/14/23  0747  Forearm  7   Urethral Catheter Mekides Nida, RN Coude 16 Fr. 12/08/23  1826   Coude  13   Wound 11/17/23 1030 Surgical Closed Surgical Incision Knee Right 11/17/23  1030  Knee  34            Pertinent labs and imaging:      Latest Ref Rng & Units 12/14/2023    4:34 AM 12/10/2023    7:33 AM 12/09/2023    4:34 AM  CBC  WBC 4.0 - 10.5 K/uL 6.3  5.9  5.6   Hemoglobin 13.0 - 17.0 g/dL 8.5  8.3  7.8   Hematocrit 39.0 - 52.0 % 27.6  26.6  24.7   Platelets 150 - 400 K/uL 467  435  415        Latest Ref Rng & Units 12/16/2023    6:28 AM 12/14/2023    4:34 AM 12/12/2023    5:05 AM  CMP  Glucose 70 - 99 mg/dL 796  728  897   BUN 8 - 23 mg/dL 63  63  63   Creatinine 0.61 - 1.24 mg/dL 7.13  6.97  7.11   Sodium 135 - 145 mmol/L 133  132  131   Potassium 3.5 - 5.1 mmol/L 4.9  4.8  4.9   Chloride 98 - 111 mmol/L 96  95  92   CO2 22 - 32 mmol/L 28  25  28    Calcium  8.9 - 10.3 mg/dL  8.6  8.6  8.5     No results found.  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 male with past medical history of HFrEF, urinary retention with chronic Foley, chronic osteomyelitis of right foot who presented with concerns of lethargy found to have chronic osteomyelitis.  He is now status post right BKA and now medically ready for discharge, [facility] patient. Patient may have SNF options available to  him and offers have been sent to several facilities. In the meantime, patient will continue to be part of [facility] and rehabilitate this way.   #Status post BKA of right foot #Chronic osteomyelitis of right foot, resolved  No signs of further infection or complications.  He has been working with PT/OT in progressing well.  Patient is currently medically ready for discharge and awaiting SNF placement. In the meantime patient will continue working with [employer] program and patient says he is getting more comfortable with transitions as he practices.  - PT/OT following - To be fit for a prosthesis, the residual limb must be fully healed, typically around 3 months post-op, usually as an outpatient. May not reimbursed with Medicare Part A.Stump shrinker has been ordered for patient to help get him ready for prosthesis. Wheelchair copay will also be $120 and patient is informed of this cost.    #Type 2 diabetes mellitus The patient was previously on metformin , glargine, and short-acting insulin . Due to episodes of hypoglycemia, insulin  was discontinued. Metformin  was also stopped because of reduced kidney function and low GFR. Fasting blood sugar this morning was 83.  - Decreased insulin  glargine back to `10 units from 12 units due to low fasting blood sugar this morning.  - Continue to monitor blood sugars regularly, and maintain dietary management. -Continue moderate SSI -Follow up on A1c as outpatient   #AKI on suspected CKD #Hyponatremia #Hyperkalemia Improved. Most recent creatinine at 2.86. Sodium seems to be stable.  Last potassium at 4.8 and last sodium 133. - Monitor kidney fxn and electrolytes with BMP   - hold metformin  (GFR:22)   #Concern for multiple myeloma Patient had SPEP with positive M spike as well as elevated protein gap.  Bone marrow biopsy finding concerns of multiple myeloma. Can follow-up outpatient. - Consulted oncology during hospital stay who will follow-up with patient  outpatient - Monitor renal function and support electrolyte status   #HFrEF Stable. echo 10/12/2023 showed EF of 20 to 25%.  Patient seems euvolemic on exam.  Patient is now on metoprolol  succinate 50 mg daily.  No acute concern for exacerbation at this time.  -Continue metoprolol  succinate 50 mg daily - Renal function will not allow for spironolactone , Entresto , or SGLT2 at this time   #History of LV thrombus #New subscapular splenic infarct -  No acute concerns at this time - Continue Eliquis  5 mg twice daily   #CAUTI, resolved Most recent catheter change on 12/08/2023.  No acute concerns at this time as infection has resolved. -Continue routine catheter care Signature:  Peyson Postema [facility]  6:43 AM, 12/22/2023  On Call pager [phone]

## 2023-12-22 NOTE — Progress Notes (Signed)
 Physical Therapy Treatment Patient Details Name: Derek Thompson MRN: [id number] DOB: 1961-03-24 Today's Date: 12/22/2023   History of Present Illness Pt is a 63 y/o M presenting to ED on 11/02/23 with lethargy. Pt found to be tachycardic, hyperkalemic, and hyponatremic. 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    PT Comments  [facility] PT Session: Pt was in wheelchair in his room organizing his belongings. Pt agreeable to working on wheelchair mobility. Pt provided with a Croc for L foot to aid in propulsion, but still has difficulty with using his L LE due to low height of the wheelchair. Pt is able to propel himself with B UE but fatigues easily. Focus of session, navigating in tight spaces, propulsion on grade and uneven surfaces and transfer from wheelchair back to bed. Pt exhibiting increased proficiency with small spaces. Pt is having difficulty with carry over of safe set up of wheelchair for safe transfers, pt needs step by step verbal cues, frequently augmented with visual cues towards wheelchair parts (brakes, leg rests, drop arm, etc). Pt is eager to work with therapy and is disappointed that he fatigues so easily. Pt will benefit from continued inpatient follow up therapy, <3 hours/day to build strength and endurance before being on his own. PT will continue to follow acutely.    If plan is discharge home, recommend the following: A lot of help with bathing/dressing/bathroom;Supervision due to cognitive status;Help with stairs or ramp for entrance;Assistance with cooking/housework;Assist for transportation;A little help with walking and/or transfers   Can travel by private vehicle     Yes  Equipment Recommendations   BSC/3in1;Wheelchair (measurements PT);Wheelchair cushion (measurements PT);Other (comment)       Precautions / Restrictions Precautions Precautions: Fall;Other (comment) Recall of Precautions/Restrictions: Impaired Other Brace: Dr Harden discontinued limb protector 8/21 Restrictions Weight Bearing Restrictions Per Provider Order: Yes RLE Weight Bearing Per Provider Order: Non weight bearing     Mobility  Bed Mobility Overal bed mobility: Modified Independent             General bed mobility comments: pt with increased fatigue able to complete transfer to bed and immediately lays back down, is able to bring both LE back into bed without assist today.    Transfers Overall transfer level: Needs assistance Equipment used: None              Lateral/Scoot Transfers: Supervision General transfer comment: pt requires step by step commands for sequencing transfer from wheelchair back to bed, with proper set up pt able to transfer at a supervision level        Wheelchair Mobility Wheelchair Mobility Wheelchair mobility: Yes Wheelchair propulsion: Both upper extremities, Left lower extremity Wheelchair parts: Needs assistance Distance: Able to tolerate about 80 feet at a time. Wheelchair Assistance Details (indicate cue type and reason): pt continues to be limited by fatigue so had pt work on navigation in tight spaces, like getting around in the room and getting on and off the elevator. pt was able to perform some propulsion on gentle grade and over uneven surfaces.         Balance Overall balance assessment: Needs assistance Sitting-balance support: No upper extremity supported, Feet supported Sitting balance-Leahy Scale: Good Sitting balance - Comments: able to perform self  care tasks seated on EOB without LOB   Standing balance support: Single extremity supported, Bilateral upper extremity supported, During functional activity Standing balance-Leahy Scale:  Poor Standing balance comment: able to assist with pulling up clothing with one extremity assist                            Communication Communication Communication: Impaired Factors Affecting Communication: Difficulty expressing self  Cognition Arousal: Alert Behavior During Therapy: Flat affect   PT - Cognitive impairments: Problem solving, Memory                       PT - Cognition Comments: pt continues to struggle with remember sequencing for performing transfers, including brake engagement, dropping the arm of the wheelchair and positioning hips for efficient transfer Following commands: Intact Following commands impaired: Follows one step commands with increased time    Cueing Cueing Techniques: Verbal cues     General Comments General comments (skin integrity, edema, etc.): pt did work with OT immediately prior to PT session, continued to reitterate energy conservation      Pertinent Vitals/Pain Pain Assessment Pain Assessment: No/denies pain     PT Goals (current goals can now be found in the care plan section) Acute Rehab PT Goals PT Goal Formulation: With patient Time For Goal Achievement: 12/29/23 Potential to Achieve Goals: Good Progress towards PT goals: Progressing toward goals    Frequency    Min 2X/week       AM-PAC PT 6 Clicks Mobility   Outcome Measure  Help needed turning from your back to your side while in a flat bed without using bedrails?: None Help needed moving from lying on your back to sitting on the side of a flat bed without using bedrails?: None Help needed moving to and from a bed to a chair (including a wheelchair)?: A Little Help needed standing up from a chair using your arms (e.g., wheelchair or bedside chair)?: A Lot Help needed to walk in hospital room?: A Lot Help needed climbing 3-5 steps with a railing? : Total 6 Click Score: 16    End of Session   Activity Tolerance: Patient limited by  fatigue Patient left: with call bell/phone within reach;in chair;with chair alarm set Nurse Communication: Mobility status PT Visit Diagnosis: Unsteadiness on feet (R26.81);Difficulty in walking, not elsewhere classified (R26.2);Other abnormalities of gait and mobility (R26.89);Muscle weakness (generalized) (M62.81) Pain - Right/Left: Right Pain - part of body: Leg     Time: 9184-9142 PT Time Calculation (min) (ACUTE ONLY): 42 min  Charges:    $Therapeutic Activity: 8-22 mins $Wheel Chair Management: 23-37 mins PT General Charges $$ ACUTE PT VISIT: 1 Visit                     Bubba Vanbenschoten B. [name] PT, DPT Acute Rehabilitation Services Please use secure chat or  Call Office [phone]    [name] [name] 12/22/2023, 9:22 AM

## 2023-12-22 NOTE — Plan of Care (Signed)
  Problem: Activity: Goal: Risk for activity intolerance will decrease Outcome: Progressing   Problem: Coping: Goal: Level of anxiety will decrease Outcome: Progressing   Problem: Elimination: Goal: Will not experience complications related to bowel motility Outcome: Progressing Goal: Will not experience complications related to urinary retention Outcome: Progressing   Problem: Education: Goal: Individualized Educational Video(s) Outcome: Progressing   Problem: Coping: Goal: Ability to adjust to condition or change in health will improve Outcome: Progressing   Problem: Fluid Volume: Goal: Ability to maintain a balanced intake and output will improve Outcome: Progressing   Problem: Health Behavior/Discharge Planning: Goal: Ability to identify and utilize available resources and services will improve Outcome: Progressing Goal: Ability to manage health-related needs will improve Outcome: Progressing   Problem: Metabolic: Goal: Ability to maintain appropriate glucose levels will improve Outcome: Progressing   Problem: Nutritional: Goal: Maintenance of adequate nutrition will improve Outcome: Progressing Goal: Progress toward achieving an optimal weight will improve Outcome: Progressing   Problem: Tissue Perfusion: Goal: Adequacy of tissue perfusion will improve Outcome: Progressing

## 2023-12-23 DIAGNOSIS — M86661 Other chronic osteomyelitis, right tibia and fibula: Secondary | ICD-10-CM | POA: Diagnosis not present

## 2023-12-23 LAB — BASIC METABOLIC PANEL WITH GFR
Anion gap: 5 (ref 5–15)
BUN: 86 mg/dL — ABNORMAL HIGH (ref 8–23)
CO2: 26 mmol/L (ref 22–32)
Calcium: 8.4 mg/dL — ABNORMAL LOW (ref 8.9–10.3)
Chloride: 99 mmol/L (ref 98–111)
Creatinine, Ser: 3.2 mg/dL — ABNORMAL HIGH (ref 0.61–1.24)
GFR, Estimated: 21 mL/min — ABNORMAL LOW (ref >60)
Glucose, Bld: 119 mg/dL — ABNORMAL HIGH (ref 70–99)
Potassium: 4.8 mmol/L (ref 3.5–5.1)
Sodium: 130 mmol/L — ABNORMAL LOW (ref 135–145)

## 2023-12-23 LAB — CBC
HCT: 24.5 % — ABNORMAL LOW (ref 39.0–52.0)
Hemoglobin: 7.8 g/dL — ABNORMAL LOW (ref 13.0–17.0)
MCH: 27.3 pg (ref 26.0–34.0)
MCHC: 31.8 g/dL (ref 30.0–36.0)
MCV: 85.7 fL (ref 80.0–100.0)
Platelets: 344 K/uL (ref 150–400)
RBC: 2.86 MIL/uL — ABNORMAL LOW (ref 4.22–5.81)
RDW: 17.7 % — ABNORMAL HIGH (ref 11.5–15.5)
WBC: 6.6 K/uL (ref 4.0–10.5)
nRBC: 0 % (ref 0.0–0.2)

## 2023-12-23 LAB — GLUCOSE, CAPILLARY
Glucose-Capillary: 153 mg/dL — ABNORMAL HIGH (ref 70–99)
Glucose-Capillary: 161 mg/dL — ABNORMAL HIGH (ref 70–99)
Glucose-Capillary: 226 mg/dL — ABNORMAL HIGH (ref 70–99)
Glucose-Capillary: 215 mg/dL — ABNORMAL HIGH (ref 70–99)

## 2023-12-23 MED ORDER — SODIUM BICARBONATE 650 MG PO TABS
1300.0000 mg | ORAL_TABLET | Freq: Two times a day (BID) | ORAL | Status: DC
  Administered 2023-12-23 – 2023-12-25 (×5): 1300 mg via ORAL
  Filled 2023-12-23 (×7): qty 2

## 2023-12-23 NOTE — Plan of Care (Signed)
  Problem: Activity: Goal: Risk for activity intolerance will decrease Outcome: Progressing   Problem: Coping: Goal: Level of anxiety will decrease Outcome: Progressing   Problem: Elimination: Goal: Will not experience complications related to bowel motility Outcome: Progressing Goal: Will not experience complications related to urinary retention Outcome: Progressing   Problem: Education: Goal: Individualized Educational Video(s) Outcome: Progressing   Problem: Coping: Goal: Ability to adjust to condition or change in health will improve Outcome: Progressing   Problem: Fluid Volume: Goal: Ability to maintain a balanced intake and output will improve Outcome: Progressing   Problem: Health Behavior/Discharge Planning: Goal: Ability to identify and utilize available resources and services will improve Outcome: Progressing Goal: Ability to manage health-related needs will improve Outcome: Progressing   Problem: Metabolic: Goal: Ability to maintain appropriate glucose levels will improve Outcome: Progressing   Problem: Nutritional: Goal: Maintenance of adequate nutrition will improve Outcome: Progressing Goal: Progress toward achieving an optimal weight will improve Outcome: Progressing   Problem: Tissue Perfusion: Goal: Adequacy of tissue perfusion will improve Outcome: Progressing

## 2023-12-23 NOTE — Progress Notes (Signed)
 HD#50 Subjective:   Summary: This is a 63 year old male with past medical history of HFrEF, urinary retention with chronic Foley, chronic osteomyelitis of right foot who presented with concerns of lethargy found to have chronic osteo.  He is now status post right BKA and now medically ready for discharge awaiting SNF placement.  Overnight Events: No acute events overnight   Patient evaluated at bedside this AM.  He was getting ready to sit at the nurses desk.  He states he is doing well.  He reports she tolerated well.  He has no concerns.  Objective:  Vital signs in last 24 hours: Vitals:   12/22/23 0709 12/22/23 1759 12/22/23 2038 12/23/23 0404  BP: 110/67 105/67 98/61 (!) 93/56  Pulse: 89 92 93 90  Resp: 18 18 18 18   Temp: 98.4 F (36.9 C) 98.5 F (36.9 C) 98.2 F (36.8 C) 98 F (36.7 C)  TempSrc: Oral Oral Oral Oral  SpO2: 98% 99% 98% 98%  Weight:      Height:       Supplemental O2: Room Air  Physical Exam:  Constitutional: well-appearing in recliner  Pulmonary/Chest: normal work of breathing on room air MSK: Right BKA appreciated, healing well GU: Cloudy urine appreciated in the foley bag   Filed Weights   12/11/23 0500 12/14/23 0500 12/19/23 0500  Weight: 62.1 kg 61.7 kg 62.7 kg     Intake/Output Summary (Last 24 hours) at 12/23/2023 0619 Last data filed at 12/23/2023 0404 Gross per 24 hour  Intake --  Output 1000 ml  Net -1000 ml   Net IO Since Admission: -46,020.5 mL [12/23/23 0619]  Pertinent Labs:    Latest Ref Rng & Units 12/14/2023    4:34 AM 12/10/2023    7:33 AM 12/09/2023    4:34 AM  CBC  WBC 4.0 - 10.5 K/uL 6.3  5.9  5.6   Hemoglobin 13.0 - 17.0 g/dL 8.5  8.3  7.8   Hematocrit 39.0 - 52.0 % 27.6  26.6  24.7   Platelets 150 - 400 K/uL 467  435  415        Latest Ref Rng & Units 12/16/2023    6:28 AM 12/14/2023    4:34 AM 12/12/2023    5:05 AM  CMP  Glucose 70 - 99 mg/dL 796  728  897   BUN 8 - 23 mg/dL 63  63  63   Creatinine 0.61 -  1.24 mg/dL 7.13  6.97  7.11   Sodium 135 - 145 mmol/L 133  132  131   Potassium 3.5 - 5.1 mmol/L 4.9  4.8  4.9   Chloride 98 - 111 mmol/L 96  95  92   CO2 22 - 32 mmol/L 28  25  28    Calcium  8.9 - 10.3 mg/dL 8.6  8.6  8.5     Imaging: No results found.  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 male with past medical history of HFrEF, urinary retention with chronic Foley, chronic osteomyelitis of right foot who presented with concerns of lethargy found to have chronic osteo.  He is now status post right BKA and now medically ready for discharge awaiting SNF placement.  #Status post BKA of right foot #Chronic osteomyelitis of right foot, resolved Patient is now officially part of the [facility].  He is doing well.  There is some hope that he can get to a SNF.  He has [facility] benefits that may be able to get him to a SNF somewhere in the state.  Awaiting SNF placement.  Meanwhile he can continue on the [facility].   - Monitor for signs of infection - PT/OT following in the [facility]  - Pending SNF placement with [facility] benefits - Shrinkers ordered for right stump  #CKD stage IV #Hyponatremia #Hyperkalemia Creatinine at 3.2.  Electrolytes are stable.  Encourage patient to stay hydrated.  Can continue to  monitor weekly labs.  -Encouraged p.o. hydration -Decrease sodium bicarbonate  to 1300 mg twice daily as bicarb as increased well   #Type 2 diabetes mellitus During hospitalization, patient has had some low blood sugars.  He is now on 10 units of Lantus  with sliding scale.  No acute concerns at this time.  Blood glucoses measuring well - Continue Lantus  10 units daily - Continue sliding scale insulin   #CAUTI, resolved Most recent catheter change on 12/08/2023.  No acute concerns at this time as infection has resolved. -Continue routine catheter care  #Concern for multiple myeloma Patient had SPEP with positive M spike as well as elevated protein gap.  Bone marrow biopsy finding concerns of multiple myeloma.  Can follow-up outpatient. - Consulted oncology during hospital stay who will follow-up with patient outpatient - Monitor renal function and support electrolyte status  #HFrEF Echo 10/12/2023 showed EF of 20 to 25%.  Patient seems euvolemic on exam.  Patient is now on metoprolol  succinate 50 mg daily.  No acute concern for exacerbation at this time.  - Continue metoprolol  succinate 50 mg daily - Continue aspirin  81 mg daily - Continue Lipitor  80 mg daily - Continue Farxiga  10 mg daily, monitor renal function while patient is on this medication  - Continue Ezetimibe  10 mg daily   #History of LV thrombus #Subscapular splenic infarct - No acute concerns at this time - Continue Eliquis  5 mg twice daily  #Chronic anemia Likely anemia of chronic disease.  Hemoglobin 7.8.  No acute concerns. - Monitor CBC weekly  Diet: Normal IVF: None,None VTE: DOAC Code: Full PT/OT recs: SNF for Subacute PT TOC recs: Getting paperwork completed for short term disability   Dispo: Anticipated discharge to Skilled nursing facility in 5 days pending placement.   Libby Blanch DO Internal Medicine Resident PGY-3 Please contact the on call pager after 5 pm and on weekends at [phone].

## 2023-12-24 DIAGNOSIS — E1169 Type 2 diabetes mellitus with other specified complication: Secondary | ICD-10-CM | POA: Diagnosis not present

## 2023-12-24 DIAGNOSIS — Z89511 Acquired absence of right leg below knee: Secondary | ICD-10-CM | POA: Diagnosis not present

## 2023-12-24 DIAGNOSIS — E11628 Type 2 diabetes mellitus with other skin complications: Secondary | ICD-10-CM | POA: Diagnosis not present

## 2023-12-24 DIAGNOSIS — M86671 Other chronic osteomyelitis, right ankle and foot: Secondary | ICD-10-CM | POA: Diagnosis not present

## 2023-12-24 LAB — GLUCOSE, CAPILLARY
Glucose-Capillary: 154 mg/dL — ABNORMAL HIGH (ref 70–99)
Glucose-Capillary: 197 mg/dL — ABNORMAL HIGH (ref 70–99)
Glucose-Capillary: 238 mg/dL — ABNORMAL HIGH (ref 70–99)
Glucose-Capillary: 171 mg/dL — ABNORMAL HIGH (ref 70–99)

## 2023-12-24 MED ORDER — INSULIN GLARGINE 100 UNIT/ML ~~LOC~~ SOLN
12.0000 [IU] | Freq: Every day | SUBCUTANEOUS | Status: DC
  Administered 2023-12-24 – 2023-12-29 (×6): 12 [IU] via SUBCUTANEOUS
  Filled 2023-12-24 (×6): qty 0.12

## 2023-12-24 NOTE — Progress Notes (Signed)
 HD#51 SUBJECTIVE:  Patient Summary: Derek Thompson is a 63 y.o. male with past medical history of HFrEF, urinary retention with chronic Foley, chronic osteomyelitis of right foot who presented with lethargy. He is now status post right BKA and now medically ready for discharge awaiting safe placement.   Overnight Events: N/A  Interim History: He states he is doing well. He reports she tolerated well. He has no concerns.   OBJECTIVE:  Vital Signs: Vitals:   12/23/23 1743 12/23/23 1912 12/24/23 0403 12/24/23 0750  BP: 114/70 (!) 101/57 100/63 (!) 97/57  Pulse: 90 91 95 86  Resp: 18 18 20 18   Temp: 98.2 F (36.8 C) 98.5 F (36.9 C) 98.2 F (36.8 C) 98.1 F (36.7 C)  TempSrc: Axillary Oral    SpO2: 99% 98% 99% 100%  Weight:      Height:       Supplemental O2: Room Air SpO2: 100 % O2 Flow Rate (L/min): 5 L/min  Filed Weights   12/11/23 0500 12/14/23 0500 12/19/23 0500  Weight: 62.1 kg 61.7 kg 62.7 kg     Intake/Output Summary (Last 24 hours) at 12/24/2023 0859 Last data filed at 12/24/2023 0442 Gross per 24 hour  Intake --  Output 900 ml  Net -900 ml   Net IO Since Admission: -46,920.5 mL [12/24/23 0859]  Physical Exam: Physical Exam Constitutional: well-appearing in bed, no acute distress Pulmonary/Chest: normal work of breathing on room air,  MSK: Right BKA appreciated, staples removed and surgical site healing well Patient Lines/Drains/Airways Status     Active Line/Drains/Airways     Name Placement date Placement time Site Days   Peripheral IV 12/14/23 22 G 1 Left;Lateral;Posterior Forearm 12/14/23  0747  Forearm  10   Urethral Catheter Mekides Nida, RN Coude 16 Fr. 12/08/23  1826  Coude  16   Wound 11/17/23 1030 Surgical Closed Surgical Incision Knee Right 11/17/23  1030  Knee  37            Pertinent labs and imaging:      Latest Ref Rng & Units 12/23/2023    5:48 AM 12/14/2023    4:34 AM 12/10/2023    7:33 AM  CBC  WBC 4.0 - 10.5 K/uL 6.6   6.3  5.9   Hemoglobin 13.0 - 17.0 g/dL 7.8  8.5  8.3   Hematocrit 39.0 - 52.0 % 24.5  27.6  26.6   Platelets 150 - 400 K/uL 344  467  435        Latest Ref Rng & Units 12/23/2023    5:48 AM 12/16/2023    6:28 AM 12/14/2023    4:34 AM  CMP  Glucose 70 - 99 mg/dL 880  796  728   BUN 8 - 23 mg/dL 86  63  63   Creatinine 0.61 - 1.24 mg/dL 6.79  7.13  6.97   Sodium 135 - 145 mmol/L 130  133  132   Potassium 3.5 - 5.1 mmol/L 4.8  4.9  4.8   Chloride 98 - 111 mmol/L 99  96  95   CO2 22 - 32 mmol/L 26  28  25    Calcium  8.9 - 10.3 mg/dL 8.4  8.6  8.6     No results found.  ASSESSMENT/PLAN:  Assessment: Principal Problem:   Osteomyelitis of right foot (HCC) Active Problems:   CAD (coronary artery disease)   Hyperkalemia   AKI (acute kidney injury) (HCC)   Anemia   LV (left ventricular) mural  thrombus without MI (HCC)   Gangrene of right foot (HCC)   Hyponatremia   HFrEF (heart failure with reduced ejection fraction) (HCC)   Chronic osteomyelitis involving lower leg, right (HCC)   Frailty   Urinary retention   Generalized weakness   Sinus tachycardia   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 (HCC)   Chronic anemia   Hypoalbuminemia   Monoclonal gammopathy   Hx of right BKA (HCC)   Moderate protein-calorie malnutrition (HCC)   Plan: Derek Thompson is a 63 y.o. male with past medical history of HFrEF, urinary retention with chronic Foley, chronic osteomyelitis of right foot who presented with concerns of lethargy found to have chronic osteomyelitis.  He is now status post right BKA and now medically ready for discharge, [facility] program patient. Patient may have SNF options available to him and offers have been sent to several facilities. In the meantime, patient will continue to be part of [facility] program and rehabilitate this way.    #Status post BKA of right foot #Chronic osteomyelitis of right foot, resolved  No signs of further infection or complications.   He has been working with PT/OT in progressing well.  Patient is currently medically ready for discharge and awaiting SNF placement. In the meantime patient will continue working with [employer] program and patient says he is getting more comfortable with transitions as he practices.  - PT/OT following - To be fit for a prosthesis, the residual limb must be fully healed, typically around 3 months post-op, usually as an outpatient. May not reimbursed with Medicare Part A.Stump shrinker has been ordered for patient to help get him ready for prosthesis. Wheelchair copay will also be $120 and patient is informed of this cost.    #Type 2 diabetes mellitus The patient was previously on metformin , glargine, and short-acting insulin . Due to episodes of hypoglycemia, insulin  was discontinued. Metformin  was also stopped because of reduced kidney function and low GFR. Fasting blood sugar this morning was 154. -Continue glargine 10 units daily  -Continue to monitor blood sugars regularly, and maintain dietary management. -Continue moderate SSI -Follow up on A1c as outpatient   #AKI on suspected CKD #Hyponatremia #Hyperkalemia Improved. Most recent creatinine at 3.2. Sodium seems to be stable.  Last potassium at 4.8 and last sodium 133. - Monitor kidney fxn and electrolytes with BMP   - hold metformin  (GFR:21)   #Concern for multiple myeloma Patient had SPEP with positive M spike as well as elevated protein gap.  Bone marrow biopsy finding concerns of multiple myeloma. Can follow-up outpatient. - Consulted oncology during hospital stay who will follow-up with patient outpatient - Monitor renal function and support electrolyte status   #HFrEF Stable. echo 10/12/2023 showed EF of 20 to 25%.  Patient seems euvolemic on exam.  Patient is now on metoprolol  succinate 50 mg daily.  No acute concern for exacerbation at this time.  -Continue metoprolol  succinate 50 mg daily - Renal function will not allow for  spironolactone , Entresto , or SGLT2 at this time   #History of LV thrombus #New subscapular splenic infarct - No acute concerns at this time - Continue Eliquis  5 mg twice daily   #CAUTI, resolved Most recent catheter change on 12/08/2023.  No acute concerns at this time as infection has resolved. -Continue routine catheter care Signature:  Imre Vecchione [facility]  8:59 AM, 12/24/2023  On Call pager [phone]

## 2023-12-24 NOTE — Plan of Care (Signed)
  Problem: Activity: Goal: Risk for activity intolerance will decrease Outcome: Progressing   Problem: Coping: Goal: Level of anxiety will decrease Outcome: Progressing   Problem: Elimination: Goal: Will not experience complications related to bowel motility Outcome: Progressing Goal: Will not experience complications related to urinary retention Outcome: Progressing   Problem: Education: Goal: Individualized Educational Video(s) Outcome: Progressing   Problem: Coping: Goal: Ability to adjust to condition or change in health will improve Outcome: Progressing   Problem: Fluid Volume: Goal: Ability to maintain a balanced intake and output will improve Outcome: Progressing   Problem: Health Behavior/Discharge Planning: Goal: Ability to identify and utilize available resources and services will improve Outcome: Progressing Goal: Ability to manage health-related needs will improve Outcome: Progressing   Problem: Metabolic: Goal: Ability to maintain appropriate glucose levels will improve Outcome: Progressing   Problem: Nutritional: Goal: Maintenance of adequate nutrition will improve Outcome: Progressing Goal: Progress toward achieving an optimal weight will improve Outcome: Progressing   Problem: Tissue Perfusion: Goal: Adequacy of tissue perfusion will improve Outcome: Progressing

## 2023-12-25 DIAGNOSIS — I5020 Unspecified systolic (congestive) heart failure: Secondary | ICD-10-CM | POA: Diagnosis not present

## 2023-12-25 DIAGNOSIS — Z8619 Personal history of other infectious and parasitic diseases: Secondary | ICD-10-CM

## 2023-12-25 DIAGNOSIS — N179 Acute kidney failure, unspecified: Secondary | ICD-10-CM | POA: Diagnosis not present

## 2023-12-25 DIAGNOSIS — Z960 Presence of urogenital implants: Secondary | ICD-10-CM

## 2023-12-25 DIAGNOSIS — Z7984 Long term (current) use of oral hypoglycemic drugs: Secondary | ICD-10-CM

## 2023-12-25 DIAGNOSIS — Z89511 Acquired absence of right leg below knee: Secondary | ICD-10-CM | POA: Diagnosis not present

## 2023-12-25 DIAGNOSIS — M869 Osteomyelitis, unspecified: Secondary | ICD-10-CM | POA: Diagnosis not present

## 2023-12-25 DIAGNOSIS — Z751 Person awaiting admission to adequate facility elsewhere: Secondary | ICD-10-CM

## 2023-12-25 DIAGNOSIS — Z8546 Personal history of malignant neoplasm of prostate: Secondary | ICD-10-CM

## 2023-12-25 DIAGNOSIS — R338 Other retention of urine: Secondary | ICD-10-CM

## 2023-12-25 DIAGNOSIS — Z794 Long term (current) use of insulin: Secondary | ICD-10-CM

## 2023-12-25 LAB — CBC
HCT: 25.6 % — ABNORMAL LOW (ref 39.0–52.0)
Hemoglobin: 8 g/dL — ABNORMAL LOW (ref 13.0–17.0)
MCH: 26.9 pg (ref 26.0–34.0)
MCHC: 31.3 g/dL (ref 30.0–36.0)
MCV: 86.2 fL (ref 80.0–100.0)
Platelets: 330 K/uL (ref 150–400)
RBC: 2.97 MIL/uL — ABNORMAL LOW (ref 4.22–5.81)
RDW: 17.7 % — ABNORMAL HIGH (ref 11.5–15.5)
WBC: 5.5 K/uL (ref 4.0–10.5)
nRBC: 0 % (ref 0.0–0.2)

## 2023-12-25 LAB — RENAL FUNCTION PANEL
Albumin: 2 g/dL — ABNORMAL LOW (ref 3.5–5.0)
Anion gap: 8 (ref 5–15)
BUN: 88 mg/dL — ABNORMAL HIGH (ref 8–23)
CO2: 27 mmol/L (ref 22–32)
Calcium: 9 mg/dL (ref 8.9–10.3)
Chloride: 91 mmol/L — ABNORMAL LOW (ref 98–111)
Creatinine, Ser: 3.57 mg/dL — ABNORMAL HIGH (ref 0.61–1.24)
GFR, Estimated: 18 mL/min — ABNORMAL LOW (ref >60)
Glucose, Bld: 224 mg/dL — ABNORMAL HIGH (ref 70–99)
Phosphorus: 4.3 mg/dL (ref 2.5–4.6)
Potassium: 5.2 mmol/L — ABNORMAL HIGH (ref 3.5–5.1)
Sodium: 126 mmol/L — ABNORMAL LOW (ref 135–145)

## 2023-12-25 LAB — GLUCOSE, CAPILLARY
Glucose-Capillary: 264 mg/dL — ABNORMAL HIGH (ref 70–99)
Glucose-Capillary: 207 mg/dL — ABNORMAL HIGH (ref 70–99)
Glucose-Capillary: 197 mg/dL — ABNORMAL HIGH (ref 70–99)
Glucose-Capillary: 51 mg/dL — ABNORMAL LOW (ref 70–99)
Glucose-Capillary: 79 mg/dL (ref 70–99)
Glucose-Capillary: 106 mg/dL — ABNORMAL HIGH (ref 70–99)

## 2023-12-25 MED ORDER — SCOPOLAMINE 1 MG/3DAYS TD PT72
1.0000 | MEDICATED_PATCH | TRANSDERMAL | Status: DC | PRN
  Administered 2023-12-25: 1 mg via TRANSDERMAL
  Filled 2023-12-25 (×2): qty 1

## 2023-12-25 NOTE — Progress Notes (Signed)
 PT Cancellation Note  Patient Details Name: Derek Thompson MRN: [id number] DOB: 16-Aug-1960   Cancelled Treatment:    Reason Eval/Treat Not Completed: (P) Patient declined, no reason specified Pt sound asleep on entry. Rouses easily but reports he is too tired to participate in therapy today. PT will follow back tomorrow.  Linnae Rasool B. [name] PT, DPT Acute Rehabilitation Services Please use secure chat or  Call Office [phone]    [name] Fleeta [facility] 12/25/2023, 3:36 PM

## 2023-12-25 NOTE — Progress Notes (Signed)
 Patient had x2 episode of nausea/vomiting. He states he thinks what he had for dinner caused him to be sick. HR noted to be elevated in the 110's. No other symptoms verbalized. MD notified and received order for EKG and an antiemetic.

## 2023-12-25 NOTE — Progress Notes (Signed)
 Hypoglycemic Event  CBG: 51  Treatment: 8 oz juice/soda  Symptoms: Pale and Nervous/irritable  Follow-up CBG: Time:1636 CBG Result:79  Possible Reasons for Event: Unknown  Comments/MD notified:Dr. Shawn notified     Derek Thompson

## 2023-12-25 NOTE — TOC Progression Note (Signed)
 Transition of Care [facility]) - Progression Note    Patient Details  Name: Derek Thompson MRN: [id number] Date of Birth: August 25, 1960  Transition of Care [facility]) CM/SW Contact  Xavious Sharrar SHAUNNA [place], [name] Phone Number: 12/25/2023, 3:46 PM  Clinical Narrative:     CSW received email from pt's union rep stating that pt should still have insurance coverage. CSW has been unable to confirm with pt's insurance that he has an active plan. Previously informed by [name] that pt's insurance ended on 12/15/23 though now pt's union rep reporting he should still have an active plan. [facility] will no longer off a bed due to the insurance issues.  Pt does have a bed offer with [facility] as long as his insurance is active. [facility] will attempt to get SNF authorization.   Expected Discharge Plan: Skilled Nursing Facility Barriers to Discharge: Insurance Authorization               Expected Discharge Plan and Services In-house Referral: Clinical Social Work   Post Acute Care Choice: Skilled Nursing Facility Living arrangements for the past 2 months: Homeless Shelter                                       Social Drivers of Health (SDOH) Interventions SDOH Screenings   Food Insecurity: No Food Insecurity (11/02/2023)  Housing: High Risk (11/02/2023)  Transportation Needs: Unmet Transportation Needs (11/02/2023)  Utilities: Not At Risk (11/02/2023)  Alcohol Screen: Low Risk  (08/19/2022)  Depression (PHQ2-9): Low Risk  (10/11/2023)  Financial Resource Strain: Low Risk  (08/19/2022)  Tobacco Use: Medium Risk (11/17/2023)    Readmission Risk Interventions     No data to display

## 2023-12-25 NOTE — Progress Notes (Signed)
 HD#52 SUBJECTIVE:  Patient Summary: Derek Thompson is a 63 y.o. with a pertinent PMH of HFrEF, urinary retention s/p prostate cancer tx, chronic osteomyelitis of RLE, and CKD, who presented with weakness and admitted for hyponatremia on 7/3. Since admission, he has undergone SPEP work up for MM, R BKA, and medical optimization of his heart failure and AKI 2/2 retention. He is medically stable for discharge and awaiting SNF  Overnight Events: Pt reported nausea and was given scopolamine   Interim History: The patient was seen and examined at the bedside this morning. He was resting comfortable and in no distress. He reports that therapy is going well and he feels as if he is becoming more confident with his mobility. He has requested a LCSW to see him today.   OBJECTIVE:  Vital Signs: Vitals:   12/24/23 2013 12/24/23 2221 12/25/23 0217 12/25/23 0620  BP: 123/79 114/65 113/73 110/68  Pulse: (!) 116 (!) 112 (!) 107 (!) 110  Resp: 20  18 18   Temp: 100.1 F (37.8 C) 99.8 F (37.7 C) 99.5 F (37.5 C) 98.7 F (37.1 C)  TempSrc: Oral Oral Oral Oral  SpO2: 97% 98% 100% 100%  Weight:      Height:       Supplemental O2: Room Air SpO2: 100 % O2 Flow Rate (L/min): 5 L/min  Filed Weights   12/11/23 0500 12/14/23 0500 12/19/23 0500  Weight: 62.1 kg 61.7 kg 62.7 kg     Intake/Output Summary (Last 24 hours) at 12/25/2023 0823 Last data filed at 12/25/2023 9372 Gross per 24 hour  Intake 120 ml  Output 1150 ml  Net -1030 ml   Net IO Since Admission: -47,830.5 mL [12/25/23 0823]  Physical Exam: Const: Awake, alert in NAD HENT: Normocephalic, atraumatic, mucus membranes moist Card: Tachycardic, regular rhythm, No MRG, No pitting edema on LE's bilaterally  Resp: LCTAB, no increased work of breathing Abd: Soft, NTND, Bsx4 Extremities: Warm, dry. Incision site on Right BKA clean, without drainage or erythema.   Patient Lines/Drains/Airways Status     Active Line/Drains/Airways      Name Placement date Placement time Site Days   Peripheral IV 12/14/23 22 G 1 Left;Lateral;Posterior Forearm 12/14/23  0747  Forearm  11   Urethral Catheter Mekides Nida, RN Coude 16 Fr. 12/08/23  1826  Coude  17   Wound 11/17/23 1030 Surgical Closed Surgical Incision Knee Right 11/17/23  1030  Knee  38            Pertinent labs and imaging:     Latest Ref Rng & Units 12/23/2023    5:48 AM 12/14/2023    4:34 AM 12/10/2023    7:33 AM  CBC  WBC 4.0 - 10.5 K/uL 6.6  6.3  5.9   Hemoglobin 13.0 - 17.0 g/dL 7.8  8.5  8.3   Hematocrit 39.0 - 52.0 % 24.5  27.6  26.6   Platelets 150 - 400 K/uL 344  467  435        Latest Ref Rng & Units 12/23/2023    5:48 AM 12/16/2023    6:28 AM 12/14/2023    4:34 AM  CMP  Glucose 70 - 99 mg/dL 880  796  728   BUN 8 - 23 mg/dL 86  63  63   Creatinine 0.61 - 1.24 mg/dL 6.79  7.13  6.97   Sodium 135 - 145 mmol/L 130  133  132   Potassium 3.5 - 5.1 mmol/L 4.8  4.9  4.8   Chloride 98 - 111 mmol/L 99  96  95   CO2 22 - 32 mmol/L 26  28  25    Calcium  8.9 - 10.3 mg/dL 8.4  8.6  8.6     No results found.  ASSESSMENT/PLAN:  Assessment: Principal Problem:   Osteomyelitis of right foot (HCC) Active Problems:   CAD (coronary artery disease)   Hyperkalemia   AKI (acute kidney injury) (HCC)   Anemia   LV (left ventricular) mural thrombus without MI (HCC)   Gangrene of right foot (HCC)   Hyponatremia   HFrEF (heart failure with reduced ejection fraction) (HCC)   Chronic osteomyelitis involving lower leg, right (HCC)   Frailty   Urinary retention   Generalized weakness   Sinus tachycardia   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 (HCC)   Chronic anemia   Hypoalbuminemia   Monoclonal gammopathy   Hx of right BKA (HCC)   Moderate protein-calorie malnutrition (HCC)   Plan: #Debility  #s/p R BKA 2/2 chronic osteomyelitis #Need for rehabilitation Mr. Pressly was originally admitted 7/3 for weakness and hyponatremia. He has  been medically optimized from this standpoint and is stable for discharge to SNF/rehab.   -s/p R BKA 7/18 with clear margins -Currently on [facility] program -Will continue to work with LCSW on disposition  #AKI on CKD -Renal function continues to fluctuate, however seems to be new baseline -Likely affected by monoclonal gammopathy  -Pt maintains good PO intake and urine output -Weekly RFP  #Chronic Foley Catheter #Urinary Retention #Hx of CAUTI -Pt with chronic urinary retention 2/2 to prostate cancer treatment. Coude catheter in place.  -s/p treatment for CAUTI x2 this admission -Catheter last changed 8/8--per urology, must be changed every 30 days.  -Continue foley care and to monitor for signs of UTI  T2DM -Glargine adjusted to 12 units yesterday with SSI -Continue Jardiance  10mg  daily  #HFrEF -Last echo 10/12/23 with EF 20-25% with global hypokinesis of LV -Continue metoprolol  succinate 50mg  daily -Further GDMT held due to renal function -Currently without exacerbation  #Elevated Protein Gap #Concern for multiple myeloma -Pt has had extensive work up for elevated protein gap since admission including SPEP with M spike, positive bence jones proteins, and BM biopsy indicative of multiple myeloma -Heme consulted and pt will follow up outpatient -Suspect this is contributing to renal function and electrolyte abnormalities in the form of hyponatremia  Best Practice: Diet: Renal diet IVF: Fluids: none, Rate: None VTE: Eliquis   Code: Full  Disposition planning: Therapy Recs: SNF, DME: wheelchair Family Contact: [name], called and notified. DISPO: Anticipated discharge pending insurance approval to SNF   Signature:  Schuyler Novak, DO [facility]  8:23 AM, 12/25/2023  On Call pager [phone]

## 2023-12-25 NOTE — TOC Progression Note (Signed)
 Transition of Care [facility]) - Progression Note    Patient Details  Name: Derek Thompson MRN: [id number] Date of Birth: October 19, 1960  [facility]) CM/SW Contact  Lendia Dais, [place] Phone Number: 12/25/2023, 3:20 PM  Clinical Narrative:  CSW contacted the following Facilities:  [facility]: Spoke to [employer] in admissions, stated that she is waiting to hear from the director.  [facility]: CSW left a VM for Martin.  [facility]:      Expected Discharge Plan: Skilled Nursing Facility Barriers to Discharge: English as a second language teacher               Expected Discharge Plan and Services In-house Referral: Clinical Social Work   Post Acute Care Choice: Skilled Nursing Facility Living arrangements for the past 2 months: Homeless Shelter                                       Social Drivers of Health (SDOH) Interventions SDOH Screenings   Food Insecurity: No Food Insecurity (11/02/2023)  Housing: High Risk (11/02/2023)  Transportation Needs: Unmet Transportation Needs (11/02/2023)  Utilities: Not At Risk (11/02/2023)  Alcohol Screen: Low Risk  (08/19/2022)  Depression (PHQ2-9): Low Risk  (10/11/2023)  Financial Resource Strain: Low Risk  (08/19/2022)  Tobacco Use: Medium Risk (11/17/2023)    Readmission Risk Interventions     No data to display

## 2023-12-25 NOTE — Progress Notes (Signed)
 Occupational Therapy Treatment Patient Details Name: Derek Thompson MRN: [id number] DOB: 09/13/1960 Today's Date: 12/25/2023   History of present illness Pt is a 63 y/o M presenting to ED on 11/02/23 with lethargy. Pt found to be tachycardic, hyperkalemic, and hyponatremic. 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   OT comments  STAR Program OT session: Patient received in supine with complaints of fatigued due to difficulty sleeping last night but willing to participate with OT. Patient stating staff was assisting him with transfers but lifting him under his arm and would like to use RW for transfers with staff.  Information relayed to nursing. Patient performed transfer with RW and CGA to stand and min assist for transfers with cues for hand placement. Patient able to perform toilet hygiene seated and stood for peri area bathing at sink with assistance for balance. Patient also demonstrated improvement with pulling up clothing while standing.  Patient ask to return to bed at end of session due to continued fatigue.  Acute OT to continue to follow with STAR program to address established goals.       If plan is discharge home, recommend the following:  A little help with walking and/or transfers;A little help with bathing/dressing/bathroom;Assistance with cooking/housework;Direct supervision/assist for medications management;Direct supervision/assist for financial management;Assist for transportation;Help with stairs or ramp for entrance   Equipment Recommendations  Other (comment) (defer)    Recommendations for Other Services      Precautions / Restrictions Precautions Precautions: Fall;Other (comment) Recall of  Precautions/Restrictions: Impaired Precaution/Restrictions Comments: R BKA- no pillows under knee Other Brace: Dr Harden discontinued limb protector 8/21 Restrictions Weight Bearing Restrictions Per Provider Order: Yes RLE Weight Bearing Per Provider Order: Non weight bearing       Mobility Bed Mobility Overal bed mobility: Modified Independent                  Transfers Overall transfer level: Needs assistance Equipment used: Rolling walker (2 wheels) Transfers: Bed to chair/wheelchair/BSC, Sit to/from Stand Sit to Stand: Contact guard assist, From elevated surface Stand pivot transfers: Min assist         General transfer comment: transfer to BSC  from EOB, BSC to wheelchair, and wheelchair back to EOB.  Patient asked to use RW for transfers on this date instead of squat pivot transfers     Balance Overall balance assessment: Needs assistance Sitting-balance support: No upper extremity supported, Feet supported Sitting balance-Leahy Scale: Good     Standing balance support: Single extremity supported, Bilateral upper extremity supported, During functional activity Standing balance-Leahy Scale: Poor Standing balance comment: able to use one extremity to perform peri care and pull up clothing while standing                           ADL either performed or assessed with clinical judgement   ADL Overall ADL's : Needs assistance/impaired     Grooming: Wash/dry hands;Wash/dry face;Oral care;Applying deodorant;Set up;Sitting Grooming Details (indicate cue type and reason): at sink Upper Body Bathing: Set up;Sitting Upper Body Bathing Details (indicate cue type and reason): at sink Lower Body Bathing: Moderate assistance;Sitting/lateral leans;Sit to/from stand Lower Body Bathing Details (indicate cue type and reason): required assistance to complete  while standing Upper Body Dressing : Set up;Sitting   Lower Body Dressing: Moderate assistance;Sit to/from  stand Lower Body Dressing Details (indicate cue type and reason): assistance for balance when pulling up clothing Toilet Transfer: Minimal assistance;BSC/3in1;Rolling walker (2 wheels) Toilet Transfer Details (indicate cue type and reason): bed to [facility] transfer Toileting- Clothing Manipulation and Hygiene: Supervision/safety;Sitting/lateral lean Toileting - Clothing Manipulation Details (indicate cue type and reason): patient performed toilet hygiene with lateral leaning       General ADL Comments: patient taking multiple rest breaks during self care due to fatigue and asking to return to bed following    Extremity/Trunk Assessment              Vision       Perception     Praxis     Communication Communication Communication: Impaired Factors Affecting Communication: Difficulty expressing self   Cognition Arousal: Alert Behavior During Therapy: Flat affect Cognition: Cognition impaired     Awareness: Intellectual awareness intact, Online awareness impaired Memory impairment (select all impairments): Short-term memory   Executive functioning impairment (select all impairments): Problem solving OT - Cognition Comments: patient quickly fatigued this treatment session, patient stating he did not sleep well                 Following commands: Intact Following commands impaired: Follows one step commands with increased time      Cueing   Cueing Techniques: Verbal cues  Exercises      Shoulder Instructions       General Comments patient fatigued quickly this session    Pertinent Vitals/ Pain       Pain Assessment Pain Assessment: No/denies pain Pain Intervention(s): Monitored during session  Home Living                                          Prior Functioning/Environment              Frequency  Min 2X/week (on STAR Program)        Progress Toward Goals  OT Goals(current goals can now be found in the care plan  section)  Progress towards OT goals: Progressing toward goals  Acute Rehab OT Goals Patient Stated Goal: to feel better OT Goal Formulation: With patient Time For Goal Achievement: 01/03/24 Potential to Achieve Goals: Fair ADL Goals Pt Will Perform Grooming: with supervision;standing Pt Will Perform Lower Body Bathing: with set-up;sitting/lateral leans Pt Will Perform Upper Body Dressing: with set-up;sitting Pt Will Perform Lower Body Dressing: with set-up;sitting/lateral leans Pt Will Transfer to Toilet: with supervision;ambulating;bedside commode Pt Will Perform Toileting - Clothing Manipulation and hygiene: with set-up;sitting/lateral leans Pt/caregiver will Perform Home Exercise Program: Increased strength;Both right and left upper extremity;With theraband;With theraputty;Independently;With written HEP provided Additional ADL Goal #1: Pt will be able to follow 1 step commands with >50% accuracy to maximize participation in ADLs  Plan      Co-evaluation                 AM-PAC OT 6 Clicks Daily Activity     Outcome Measure   Help from another person eating meals?: None Help from another person taking care of personal grooming?: A Little Help from another person toileting, which includes using toliet, bedpan, or urinal?: A Lot Help from another person bathing (including washing, rinsing, drying)?: A Little Help from another person to put on and taking  off regular upper body clothing?: A Little Help from another person to put on and taking off regular lower body clothing?: A Lot 6 Click Score: 17    End of Session Equipment Utilized During Treatment: Gait belt;Rolling walker (2 wheels);Other (comment) (wheelchair)  OT Visit Diagnosis: Unsteadiness on feet (R26.81);Other abnormalities of gait and mobility (R26.89);Muscle weakness (generalized) (M62.81);Other symptoms and signs involving cognitive function   Activity Tolerance Patient limited by fatigue   Patient Left  in bed;with call bell/phone within reach;with bed alarm set   Nurse Communication Mobility status;Other (comment) (relayed patient's request to not be lifted up by under his arms for transfers)        Time: 0719-0800 OT Time Calculation (min): 41 min  Charges: OT General Charges $OT Visit: 1 Visit OT Treatments $Self Care/Home Management : 38-52 mins  Derek Thompson, OTA Acute Rehabilitation Services  Office [phone]   Derek Thompson 12/25/2023, 10:23 AM

## 2023-12-26 DIAGNOSIS — N179 Acute kidney failure, unspecified: Secondary | ICD-10-CM | POA: Diagnosis not present

## 2023-12-26 DIAGNOSIS — M869 Osteomyelitis, unspecified: Secondary | ICD-10-CM | POA: Diagnosis not present

## 2023-12-26 DIAGNOSIS — Z89511 Acquired absence of right leg below knee: Secondary | ICD-10-CM | POA: Diagnosis not present

## 2023-12-26 DIAGNOSIS — I5020 Unspecified systolic (congestive) heart failure: Secondary | ICD-10-CM | POA: Diagnosis not present

## 2023-12-26 LAB — GLUCOSE, CAPILLARY
Glucose-Capillary: 144 mg/dL — ABNORMAL HIGH (ref 70–99)
Glucose-Capillary: 115 mg/dL — ABNORMAL HIGH (ref 70–99)
Glucose-Capillary: 239 mg/dL — ABNORMAL HIGH (ref 70–99)
Glucose-Capillary: 216 mg/dL — ABNORMAL HIGH (ref 70–99)
Glucose-Capillary: 175 mg/dL — ABNORMAL HIGH (ref 70–99)

## 2023-12-26 LAB — RENAL FUNCTION PANEL
Albumin: 1.9 g/dL — ABNORMAL LOW (ref 3.5–5.0)
Anion gap: 8 (ref 5–15)
BUN: 91 mg/dL — ABNORMAL HIGH (ref 8–23)
CO2: 28 mmol/L (ref 22–32)
Calcium: 8.9 mg/dL (ref 8.9–10.3)
Chloride: 91 mmol/L — ABNORMAL LOW (ref 98–111)
Creatinine, Ser: 3.8 mg/dL — ABNORMAL HIGH (ref 0.61–1.24)
GFR, Estimated: 17 mL/min — ABNORMAL LOW (ref >60)
Glucose, Bld: 120 mg/dL — ABNORMAL HIGH (ref 70–99)
Phosphorus: 4.9 mg/dL — ABNORMAL HIGH (ref 2.5–4.6)
Potassium: 5.8 mmol/L — ABNORMAL HIGH (ref 3.5–5.1)
Sodium: 127 mmol/L — ABNORMAL LOW (ref 135–145)

## 2023-12-26 MED ORDER — SODIUM ZIRCONIUM CYCLOSILICATE 5 G PO PACK
5.0000 g | PACK | Freq: Every day | ORAL | Status: DC
  Administered 2023-12-28 – 2023-12-29 (×2): 5 g via ORAL
  Filled 2023-12-26 (×3): qty 1

## 2023-12-26 MED ORDER — INSULIN ASPART 100 UNIT/ML IJ SOLN
0.0000 [IU] | Freq: Three times a day (TID) | INTRAMUSCULAR | Status: DC
  Administered 2023-12-26: 3 [IU] via SUBCUTANEOUS
  Administered 2023-12-27 (×2): 2 [IU] via SUBCUTANEOUS
  Administered 2023-12-27: 3 [IU] via SUBCUTANEOUS
  Administered 2023-12-28: 2 [IU] via SUBCUTANEOUS
  Administered 2023-12-28: 3 [IU] via SUBCUTANEOUS
  Administered 2023-12-28: 1 [IU] via SUBCUTANEOUS
  Administered 2023-12-29: 2 [IU] via SUBCUTANEOUS
  Administered 2023-12-29: 3 [IU] via SUBCUTANEOUS

## 2023-12-26 MED ORDER — SODIUM BICARBONATE 650 MG PO TABS
650.0000 mg | ORAL_TABLET | Freq: Two times a day (BID) | ORAL | Status: DC
  Administered 2023-12-26 – 2023-12-29 (×7): 650 mg via ORAL
  Filled 2023-12-26 (×6): qty 1

## 2023-12-26 MED ORDER — CHLORHEXIDINE GLUCONATE CLOTH 2 % EX PADS
6.0000 | MEDICATED_PAD | Freq: Every day | CUTANEOUS | Status: DC
Start: 2023-12-26 — End: 2024-01-09
  Administered 2023-12-24 – 2024-01-09 (×14): 6 via TOPICAL

## 2023-12-26 MED ORDER — SODIUM ZIRCONIUM CYCLOSILICATE 10 G PO PACK
10.0000 g | PACK | Freq: Once | ORAL | Status: AC
  Administered 2023-12-26: 10 g via ORAL
  Filled 2023-12-26: qty 1

## 2023-12-26 NOTE — Progress Notes (Signed)
 Occupational Therapy Treatment Patient Details Name: Derek Thompson MRN: [id number] DOB: December 02, 1960 Today's Date: 12/26/2023   History of present illness Pt is a 63 y/o M presenting to ED on 11/02/23 with lethargy. Pt found to be tachycardic, hyperkalemic, and hyponatremic. 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   OT comments  STAR OT session: Patient stated he felt better today due to slept throughout night. Patient able to perform squat pivot transfer to wheelchair with education on setup and moving wheelchair arm. Patient performed lateral scoot transfer to tub bench and perform bathing in shower with min assist for LB bathing. Patient performed dressing with gains with manipulating catheter through clothing. Patient able to perform toilet transfer with Stand pivot transfer with RW and min assist and supervision for toilet hygiene. Patient continues to require mod assist for clothing management. Discharge recommendations continue to be appropriate.  Acute OT to continue to follow with STAR program to follow established goals.       If plan is discharge home, recommend the following:  A little help with walking and/or transfers;A little help with bathing/dressing/bathroom;Assistance with cooking/housework;Direct supervision/assist for medications management;Direct supervision/assist for financial management;Assist for transportation;Help with stairs or ramp for entrance   Equipment Recommendations  Other (comment) (defer)    Recommendations for Other Services      Precautions / Restrictions Precautions Precautions: Fall;Other (comment) Recall of Precautions/Restrictions: Impaired Precaution/Restrictions Comments: R BKA-  no pillows under knee Other Brace: Dr Harden discontinued limb protector 8/21 Restrictions Weight Bearing Restrictions Per Provider Order: Yes RLE Weight Bearing Per Provider Order: Non weight bearing       Mobility Bed Mobility Overal bed mobility: Modified Independent Bed Mobility: Supine to Sit           General bed mobility comments: able to get to EOB without assistance    Transfers Overall transfer level: Needs assistance Equipment used: Rolling walker (2 wheels), None Transfers: Sit to/from Stand, Bed to chair/wheelchair/BSC Sit to Stand: Contact guard assist, From elevated surface Stand pivot transfers: Min assist Squat pivot transfers: Min assist      Lateral/Scoot Transfers: Supervision General transfer comment: squat pivot transfer from EOB to wheelchair, lateral scoot transfer to shower with tub bench, and stand pivot transfer to [facility]     Balance Overall balance assessment: Needs assistance Sitting-balance support: No upper extremity supported, Feet supported Sitting balance-Leahy Scale: Good     Standing balance support: Single extremity supported, Bilateral upper extremity supported, During functional activity Standing balance-Leahy Scale: Poor Standing balance comment: min assist for balance with one extremity support                           ADL either performed or assessed with clinical judgement   ADL Overall ADL's : Needs assistance/impaired     Grooming: Wash/dry hands;Wash/dry face;Oral care;Applying deodorant;Set up;Sitting   Upper Body Bathing: Set up;Sitting Upper Body Bathing Details (indicate cue type and reason): in shower Lower Body Bathing: Minimal assistance;Sitting/lateral leans Lower Body Bathing Details (indicate cue type and reason): able to perform bathing seated with lateral leaning to get peri area Upper Body Dressing : Set up;Sitting   Lower Body Dressing: Moderate assistance;Sit to/from stand Lower Body Dressing  Details (  indicate cue type and reason): assistance mainly for pulling up clothing while standing Toilet Transfer: Minimal assistance;BSC/3in1;Rolling walker (2 wheels) Toilet Transfer Details (indicate cue type and reason): min assist to steady Toileting- Clothing Manipulation and Hygiene: Supervision/safety;Sitting/lateral lean Toileting - Clothing Manipulation Details (indicate cue type and reason): patient performed toilet hygiene with lateral leaning Tub/ Shower Transfer: Walk-in shower;Tub bench Tub/Shower Transfer Details (indicate cue type and reason): lateral scoot transfer to/from tub bench, from/to wheelchair with supervision   General ADL Comments: patient fatigue following shower and dressing    Extremity/Trunk Assessment              Vision       Perception     Praxis     Communication Communication Communication: Impaired Factors Affecting Communication: Difficulty expressing self   Cognition Arousal: Alert Behavior During Therapy: Flat affect Cognition: Cognition impaired       Memory impairment (select all impairments): Short-term memory   Executive functioning impairment (select all impairments): Problem solving                   Following commands: Intact Following commands impaired: Follows one step commands with increased time      Cueing   Cueing Techniques: Verbal cues  Exercises Exercises: Other exercises Other Exercises Other Exercises: wheelchair mobility in hallway    Shoulder Instructions       General Comments Patient stayed up in wheelchiar at end of session    Pertinent Vitals/ Pain       Pain Assessment Pain Assessment: No/denies pain Pain Intervention(s): Monitored during session  Home Living                                          Prior Functioning/Environment              Frequency  Min 2X/week (on STAR Program)        Progress Toward Goals  OT Goals(current goals can now be  found in the care plan section)  Progress towards OT goals: Progressing toward goals  Acute Rehab OT Goals Patient Stated Goal: to be more independent OT Goal Formulation: With patient Time For Goal Achievement: 01/03/24 Potential to Achieve Goals: Fair ADL Goals Pt Will Perform Grooming: with supervision;standing Pt Will Perform Lower Body Bathing: with set-up;sitting/lateral leans Pt Will Perform Upper Body Dressing: with set-up;sitting Pt Will Perform Lower Body Dressing: with set-up;sitting/lateral leans Pt Will Transfer to Toilet: with supervision;ambulating;bedside commode Pt Will Perform Toileting - Clothing Manipulation and hygiene: with set-up;sitting/lateral leans Pt/caregiver will Perform Home Exercise Program: Increased strength;Both right and left upper extremity;With theraband;With theraputty;Independently;With written HEP provided Additional ADL Goal #1: Pt will be able to follow 1 step commands with >50% accuracy to maximize participation in ADLs  Plan      Co-evaluation                 AM-PAC OT 6 Clicks Daily Activity     Outcome Measure   Help from another person eating meals?: None Help from another person taking care of personal grooming?: A Little Help from another person toileting, which includes using toliet, bedpan, or urinal?: A Lot Help from another person bathing (including washing, rinsing, drying)?: A Little Help from another person to put on and taking off regular upper body clothing?: A Little Help from another person to put on and taking off regular lower  body clothing?: A Lot 6 Click Score: 17    End of Session Equipment Utilized During Treatment: Gait belt;Rolling walker (2 wheels);Other (comment) (wheelchair)  OT Visit Diagnosis: Unsteadiness on feet (R26.81);Other abnormalities of gait and mobility (R26.89);Muscle weakness (generalized) (M62.81);Other symptoms and signs involving cognitive function   Activity Tolerance Patient  tolerated treatment well   Patient Left in chair;with call bell/phone within reach;Other (comment) (in wheelchair)   Nurse Communication Mobility status        Time: 0722-0820 OT Time Calculation (min): 58 min  Charges: OT General Charges $OT Visit: 1 Visit OT Treatments $Self Care/Home Management : 38-52 mins $Therapeutic Exercise: 8-22 mins  Dick Laine, OTA Acute Rehabilitation Services  Office [phone]   Jeb LITTIE Laine 12/26/2023, 10:51 AM

## 2023-12-26 NOTE — TOC Progression Note (Addendum)
 Transition of Care [facility]) - Progression Note    Patient Details  Name: Derek Thompson MRN: [id number] Date of Birth: 1960/12/29  Transition of Care [facility]) CM/SW Contact  [name] [place], [name] Phone Number: 12/26/2023, 2:50 PM  Clinical Narrative:     [facility] rescinded bed offer. Pt continues to have no bed offers through his [employer] as well as through is [facility] approved contract. Pt to continue working with therapies in  [place] program and [facility] .TOC will continue to follow.    Expected Discharge Plan: Skilled Nursing Facility Barriers to Discharge: Insurance Authorization               Expected Discharge Plan and Services In-house Referral: Clinical Social Work   Post Acute Care Choice: Skilled Nursing Facility Living arrangements for the past 2 months: Homeless Shelter                                       Social Drivers of Health (SDOH) Interventions SDOH Screenings   Food Insecurity: No Food Insecurity (11/02/2023)  Housing: High Risk (11/02/2023)  Transportation Needs: Unmet Transportation Needs (11/02/2023)  Utilities: Not At Risk (11/02/2023)  Alcohol Screen: Low Risk  (08/19/2022)  Depression (PHQ2-9): Low Risk  (10/11/2023)  Financial Resource Strain: Low Risk  (08/19/2022)  Tobacco Use: Medium Risk (11/17/2023)    Readmission Risk Interventions     No data to display

## 2023-12-26 NOTE — Progress Notes (Signed)
 HD#[id number] SUBJECTIVE:  Patient Summary: Derek Thompson is a 63 y.o. with a pertinent PMH of HFrEF, urinary retention s/p prostate cancer tx, chronic osteomyelitis of RLE, and CKD, who presented with weakness and admitted for hyponatremia on 7/3. Since admission, he has undergone SPEP work up for MM, R BKA, and medical optimization of his heart failure and AKI 2/2 retention. He is medically stable for discharge and awaiting SNF  Overnight Events: n/a  Interim History: The patient was seen and examined sitting up in the wheelchair this morning. He was feeling well this morning, however endorses not having much appetite.   OBJECTIVE:  Vital Signs: Vitals:   12/25/23 2029 12/26/23 0600 12/26/23 0615 12/26/23 0802  BP: 96/64 (!) 91/58 (!) 91/58 (!) 77/53  Pulse: 92 89 91 92  Resp: 18 18  18   Temp: 98.2 F (36.8 C) 98 F (36.7 C)  97.6 F (36.4 C)  TempSrc: Oral Axillary  Oral  SpO2: 98% 99% 99% 99%  Weight:      Height:       Supplemental O2: Room Air SpO2: 99 % O2 Flow Rate (L/min): 5 L/min  Filed Weights   12/11/23 0500 12/14/23 0500 12/19/23 0500  Weight: 62.1 kg 61.7 kg 62.7 kg     Intake/Output Summary (Last 24 hours) at 12/26/2023 0916 Last data filed at 12/25/2023 2029 Gross per 24 hour  Intake 0 ml  Output 850 ml  Net -850 ml   Net IO Since Admission: -48,440.5 mL [12/26/23 0916]  Physical Exam: Const: Awake, alert in NAD HENT: Normocephalic, atraumatic, mucus membranes moist Card: Regular rate and rhythm, No MRG, No pitting edema on LE's bilaterally  Resp: LCTAB, no increased work of breathing Abd: Soft, NTND, Bsx4 Extremities: Warm, dry. Incision site on Right BKA clean, without drainage or erythema. Skin tenting noted to the bilateral lower extremities.   Patient Lines/Drains/Airways Status     Active Line/Drains/Airways     Name Placement date Placement time Site Days   Peripheral IV 12/14/23 22 G 1 Left;Lateral;Posterior Forearm 12/14/23  0747   Forearm  11   Urethral Catheter Mekides Nida, RN Coude 16 Fr. 12/08/23  1826  Coude  17   Wound 11/17/23 1030 Surgical Closed Surgical Incision Knee Right 11/17/23  1030  Knee  38            Pertinent labs and imaging:     Latest Ref Rng & Units 12/25/2023   11:01 AM 12/23/2023    5:48 AM 12/14/2023    4:34 AM  CBC  WBC 4.0 - 10.5 K/uL 5.5  6.6  6.3   Hemoglobin 13.0 - 17.0 g/dL 8.0  7.8  8.5   Hematocrit 39.0 - 52.0 % 25.6  24.5  27.6   Platelets 150 - 400 K/uL 330  344  467        Latest Ref Rng & Units 12/26/2023    5:18 AM 12/25/2023   11:01 AM 12/23/2023    5:48 AM  CMP  Glucose 70 - 99 mg/dL 879  775  880   BUN 8 - 23 mg/dL 91  88  86   Creatinine 0.61 - 1.24 mg/dL 6.19  6.42  6.79   Sodium 135 - 145 mmol/L 127  126  130   Potassium 3.5 - 5.1 mmol/L 5.8  5.2  4.8   Chloride 98 - 111 mmol/L 91  91  99   CO2 22 - 32 mmol/L 28  27  26  Calcium  8.9 - 10.3 mg/dL 8.9  9.0  8.4     No results found.  ASSESSMENT/PLAN:  Assessment: Principal Problem:   Osteomyelitis of right foot (HCC) Active Problems:   CAD (coronary artery disease)   Hyperkalemia   AKI (acute kidney injury) (HCC)   Anemia   LV (left ventricular) mural thrombus without MI (HCC)   Gangrene of right foot (HCC)   Hyponatremia   HFrEF (heart failure with reduced ejection fraction) (HCC)   Chronic osteomyelitis involving lower leg, right (HCC)   Frailty   Urinary retention   Generalized weakness   Sinus tachycardia   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 (HCC)   Chronic anemia   Hypoalbuminemia   Monoclonal gammopathy   Hx of right BKA (HCC)   Moderate protein-calorie malnutrition (HCC)   Plan: #Debility  #s/p R BKA 2/2 chronic osteomyelitis #Need for rehabilitation [name] was originally admitted 7/3 for weakness and hyponatremia. He has been medically optimized from this standpoint and is stable for discharge to SNF/rehab.   -s/p R BKA 7/18 with clear  margins -Currently on STAR program -Will continue to work with LCSW on disposition  #AKI on CKD #Electrolyte disturbances -Renal function continues to fluctuate, however seems to be new baseline with creatinine ~2-3 -Likely affected by monoclonal gammopathy  -Over the last two days, creatinine continues to increase, today 3.8 with BUN 91. The patient has noted decreased oral intake due to decreased appetite and intermittent nausea over the past few days. -K 5.8 today -At this time, will stop farxiga   -Lokelma  10mg  today -Daily RFP's  #Chronic Foley Catheter #Urinary Retention #Hx of CAUTI -Pt with chronic urinary retention 2/2 to prostate cancer treatment. Coude catheter in place.  -s/p treatment for CAUTI x2 this admission -Catheter last changed 8/8--per urology, must be changed every 30 days.  -Continue foley care and to monitor for signs of UTI  #T2DM -Glargine adjusted to 12 units yesterday with SSI -With d/c farxiga  due to poor renal function -Pt became hypoglycemic yesterday, likely due to poor meal intake throughout the day. Encourage oral intake and monitor CBG closely  #HFrEF -Last echo 10/12/23 with EF 20-25% with global hypokinesis of LV -Continue metoprolol  succinate 50mg  daily -Further GDMT held due to renal function -Currently without exacerbation  #Elevated Protein Gap #Concern for multiple myeloma -Pt has had extensive work up for elevated protein gap since admission including SPEP with M spike, positive bence jones proteins, and BM biopsy indicative of multiple myeloma -Heme consulted and pt will follow up outpatient -Suspect this is contributing to renal function and electrolyte abnormalities in the form of hyponatremia  Best Practice: Diet: Renal diet IVF: Fluids: none, Rate: None VTE: Eliquis   Code: Full  Disposition planning: Therapy Recs: SNF, DME: wheelchair Family Contact: Derek Thompson, called and notified. DISPO: Anticipated discharge pending  insurance approval to SNF   Signature:  Schuyler Novak, DO [facility]  9:16 AM, 12/26/2023  On Call pager [phone]

## 2023-12-26 NOTE — Progress Notes (Signed)
 Physical Therapy Treatment Patient Details Name: Derek Thompson MRN: [id number] DOB: [birth year] Today's Date: 12/26/2023   History of Present Illness Pt is a 63 y/o M presenting to ED on 11/02/23 with lethargy. Pt found to be tachycardic, hyperkalemic, and hyponatremic. 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    PT Comments  STAR PT Session: Pt up in wheelchair on entry, able to lean outside BoS to look through his bags for warmer clothes to go outside. Pt is able to continue working on tight space wheelchair management, and propulsion across uneven surfaces and building endurance with short bouts of propulsion, with rest breaks. Pt with increased fatigue with return to room. PT asked pt to set up for transfer from wheelchair back to bed. Pt chooses to transfer to his R across his BKA. PT inquires as to whether he has done it that way before, and pt reports he has. PT provided very close supervision for transfer pt, with increased difficulty transferring to his R ultimately tipping over to his R, requiring min A for returning his L LE to bed. Discussed safety in transferring to his L. Pt with increased fatigue and falling asleep as PT provided education. Will reinforce in next session. D/c plans remain appropriate.     If plan is discharge home, recommend the following: A lot of help with bathing/dressing/bathroom;Supervision due to cognitive status;Help with stairs or ramp for entrance;Assistance with cooking/housework;Assist for transportation;A little help with walking and/or transfers   Can travel by private vehicle     Yes  Equipment Recommendations  BSC/3in1;Wheelchair (measurements PT);Wheelchair cushion (measurements  PT);Other (comment)       Precautions / Restrictions Precautions Precautions: Fall;Other (comment) Recall of Precautions/Restrictions: Impaired Precaution/Restrictions Comments: R BKA- no pillows under knee Other Brace: Dr Harden discontinued limb protector 8/21 Restrictions Weight Bearing Restrictions Per Provider Order: Yes RLE Weight Bearing Per Provider Order: Non weight bearing     Mobility  Bed Mobility Overal bed mobility: Needs Assistance         Sit to supine: Min assist   General bed mobility comments: pt up in wheelchair on entry, with return to bed pt immediately lays down but L foot gets tangled in chair due to increased fatigue with transfer, benefits from min A for doffing shoe and returning L LE to bed    Transfers Overall transfer level: Needs assistance Equipment used: None              Lateral/Scoot Transfers: Supervision General transfer comment: overall suprevision, asked pt about direction of transfer to R across R BKA, requires multple scoots and then leans over on R LE and leans himself into bed, discussed safety and more successful transfers in future. Pt completely fatigued on laying down, will need further education in next session to reinforce     Merchant [employer] officer mobility: Yes Wheelchair propulsion: Both upper extremities, Left lower extremity Wheelchair parts: Supervision/cueing Distance: 50 (at a time requiring rest breaks in between, total of ~300 ft during session) Wheelchair Assistance Details (indicate cue type and reason): continue to work on building endurance with wheelchair taking short bouts of propulsion and then rest breaks, with increased effort is able to propel himself on uneven surfaces  Balance Overall balance assessment: Needs assistance Sitting-balance support: No upper extremity supported, Feet supported Sitting balance-Leahy Scale: Good Sitting balance - Comments: able to  perform self care tasks seated on EOB without LOB                                    Communication Communication Communication: Impaired Factors Affecting Communication: Difficulty expressing self  Cognition Arousal: Alert Behavior During Therapy: Flat affect   PT - Cognitive impairments: Problem solving, Memory                       PT - Cognition Comments: pt transfers from chair to bed to his R. When asked if he had done that before he replies affirmatively, however, obviously has not done that before, over all successful but not efficient Following commands: Intact Following commands impaired: Follows one step commands with increased time    Cueing Cueing Techniques: Verbal cues  Exercises Amputee Exercises Knee Flexion: AROM, Right, 20 reps, Seated (focus on end range) Knee Extension: AROM, Right, 20 reps, Seated (focus on end range)    General Comments General comments (skin integrity, edema, etc.): pt continuest to be limited by increased fatigue, VSS, contacted [facility] to [facility] [employer] consult for pre prosthetic training      Pertinent Vitals/Pain Pain Assessment Pain Assessment: Faces Faces Pain Scale: Hurts a little bit Pain Location: Bil shoulder aches Pain Descriptors / Indicators: Grimacing, Tiring Pain Intervention(s): Limited activity within patient's tolerance, Monitored during session, Premedicated before session, Repositioned     PT Goals (current goals can now be found in the care plan section) Acute Rehab PT Goals PT Goal Formulation: With patient Time For Goal Achievement: 12/29/23 Potential to Achieve Goals: Good Progress towards PT goals: Progressing toward goals    Frequency    Min 2X/week       AM-PAC PT 6 Clicks Mobility   Outcome Measure  Help needed turning from your back to your side while in a flat bed without using bedrails?: None Help needed moving from lying on your back to sitting on the side of a  flat bed without using bedrails?: None Help needed moving to and from a bed to a chair (including a wheelchair)?: A Little Help needed standing up from a chair using your arms (e.g., wheelchair or bedside chair)?: A Lot Help needed to walk in hospital room?: A Lot Help needed climbing 3-5 steps with a railing? : Total 6 Click Score: 16    End of Session   Activity Tolerance: Patient limited by fatigue Patient left: with call bell/phone within reach;in chair;with chair alarm set Nurse Communication: Mobility status PT Visit Diagnosis: Unsteadiness on feet (R26.81);Difficulty in walking, not elsewhere classified (R26.2);Other abnormalities of gait and mobility (R26.89);Muscle weakness (generalized) (M62.81) Pain - Right/Left: Right Pain - part of body: Leg     Time: 9149-9054 PT Time Calculation (min) (ACUTE ONLY): 55 min  Charges:    $Therapeutic Activity: 23-37 mins $Wheel Chair Management: 23-37 mins PT General Charges $$ ACUTE PT VISIT: 1 Visit                     Jerelene Salaam B. [name] PT, DPT Acute Rehabilitation Services Please use secure chat or  Call Office [phone]    [name] Fleeta [facility] 12/26/2023, 10:42 AM

## 2023-12-26 NOTE — Plan of Care (Signed)
  Problem: Activity: Goal: Risk for activity intolerance will decrease Outcome: Progressing   Problem: Coping: Goal: Level of anxiety will decrease Outcome: Progressing   Problem: Elimination: Goal: Will not experience complications related to bowel motility Outcome: Progressing Goal: Will not experience complications related to urinary retention Outcome: Progressing   Problem: Coping: Goal: Ability to adjust to condition or change in health will improve Outcome: Progressing

## 2023-12-27 DIAGNOSIS — M869 Osteomyelitis, unspecified: Secondary | ICD-10-CM | POA: Diagnosis not present

## 2023-12-27 DIAGNOSIS — I5020 Unspecified systolic (congestive) heart failure: Secondary | ICD-10-CM | POA: Diagnosis not present

## 2023-12-27 DIAGNOSIS — N179 Acute kidney failure, unspecified: Secondary | ICD-10-CM | POA: Diagnosis not present

## 2023-12-27 DIAGNOSIS — Z89511 Acquired absence of right leg below knee: Secondary | ICD-10-CM | POA: Diagnosis not present

## 2023-12-27 LAB — URINALYSIS, ROUTINE W REFLEX MICROSCOPIC
Bilirubin Urine: NEGATIVE
Glucose, UA: >=500 mg/dL — AB
Ketones, ur: NEGATIVE mg/dL
Nitrite: NEGATIVE
Protein, ur: 100 mg/dL — AB
Specific Gravity, Urine: 1.006 (ref 1.005–1.030)
WBC, UA: >50 WBC/hpf (ref 0–5)
pH: 8 (ref 5.0–8.0)

## 2023-12-27 LAB — SODIUM, URINE, RANDOM: Sodium, Ur: 38 mmol/L

## 2023-12-27 LAB — RENAL FUNCTION PANEL
Albumin: 2.1 g/dL — ABNORMAL LOW (ref 3.5–5.0)
Anion gap: 13 (ref 5–15)
BUN: 109 mg/dL — ABNORMAL HIGH (ref 8–23)
CO2: 26 mmol/L (ref 22–32)
Calcium: 8.9 mg/dL (ref 8.9–10.3)
Chloride: 87 mmol/L — ABNORMAL LOW (ref 98–111)
Creatinine, Ser: 3.9 mg/dL — ABNORMAL HIGH (ref 0.61–1.24)
GFR, Estimated: 16 mL/min — ABNORMAL LOW (ref >60)
Glucose, Bld: 108 mg/dL — ABNORMAL HIGH (ref 70–99)
Phosphorus: 4.4 mg/dL (ref 2.5–4.6)
Potassium: 5.3 mmol/L — ABNORMAL HIGH (ref 3.5–5.1)
Sodium: 126 mmol/L — ABNORMAL LOW (ref 135–145)

## 2023-12-27 LAB — GLUCOSE, CAPILLARY
Glucose-Capillary: 196 mg/dL — ABNORMAL HIGH (ref 70–99)
Glucose-Capillary: 214 mg/dL — ABNORMAL HIGH (ref 70–99)
Glucose-Capillary: 176 mg/dL — ABNORMAL HIGH (ref 70–99)
Glucose-Capillary: 114 mg/dL — ABNORMAL HIGH (ref 70–99)

## 2023-12-27 LAB — OSMOLALITY: Osmolality: 321 mosm/kg (ref 275–295)

## 2023-12-27 LAB — CREATININE, URINE, RANDOM: Creatinine, Urine: 20 mg/dL

## 2023-12-27 LAB — OSMOLALITY, URINE: Osmolality, Ur: 253 mosm/kg — ABNORMAL LOW (ref 300–900)

## 2023-12-27 MED ORDER — LACTATED RINGERS IV BOLUS
1000.0000 mL | Freq: Once | INTRAVENOUS | Status: AC
  Administered 2023-12-27: 1000 mL via INTRAVENOUS

## 2023-12-27 MED ORDER — SODIUM ZIRCONIUM CYCLOSILICATE 10 G PO PACK
10.0000 g | PACK | Freq: Once | ORAL | Status: AC
  Administered 2023-12-27: 10 g via ORAL
  Filled 2023-12-27: qty 1

## 2023-12-27 NOTE — TOC Progression Note (Signed)
 Transition of Care [facility]) - Progression Note    Patient Details  Name: Derek Thompson MRN: [id number] Date of Birth: January 15, 1961  [facility] CM/SW Contact  Lendia Dais, [place] Phone Number: 12/27/2023, 4:12 PM  Clinical Narrative:  Barriers of placement still remain in place. Pt's medicaid is still pending. CSW emailed the financial navigator Phill Molt to inform CSW when the pt receives a DSS social worker, pt may receive a letter of guarantee to a SNF.   Jasmine Pearson of [facility] sent an email following up with CSW and declined a bed offer to the pt due to their status of homeless and not being able to discharge to a homeless shelter.  CSW refaxed referrals to [facility] SNF's.  CSW will follow up with referrals.    Expected Discharge Plan: Skilled Nursing Facility Barriers to Discharge: Insurance Authorization               Expected Discharge Plan and Services In-house Referral: Clinical Social Work   Post Acute Care Choice: Skilled Nursing Facility Living arrangements for the past 2 months: Homeless Shelter                                       Social Drivers of Health (SDOH) Interventions SDOH Screenings   Food Insecurity: No Food Insecurity (11/02/2023)  Housing: High Risk (11/02/2023)  Transportation Needs: Unmet Transportation Needs (11/02/2023)  Utilities: Not At Risk (11/02/2023)  Alcohol Screen: Low Risk  (08/19/2022)  Depression (PHQ2-9): Low Risk  (10/11/2023)  Financial Resource Strain: Low Risk  (08/19/2022)  Tobacco Use: Medium Risk (11/17/2023)    Readmission Risk Interventions     No data to display

## 2023-12-27 NOTE — Progress Notes (Signed)
 Physical Therapy Treatment Patient Details Name: Derek Thompson MRN: [id number] DOB: June 12, 1960 Today's Date: 12/27/2023   History of Present Illness Pt is a 63 y/o M presenting to ED on 11/02/23 with lethargy. Pt found to be tachycardic, hyperkalemic, and hyponatremic. 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    PT Comments  STAR PT/OT Session: Per LCSW notes, pt has not had any SNF bed offers and plan is still for discharge back to [facility] at an independent level. Pt continues to be limited in safe mobility by increased fatigue in presence of generalized weakness, decreased strength, difficulty understanding energy conservation and need for assist with managing wheelchair parts. Pt working with OT on entry on getting dressed. Afterward pt is able to make transfer from bed to wheelchair with RW at a contact guard level. Worked with pt on wheelchair management and energy conservation techniques during session. Today pt is able to make stand pivot transfers, and squat pivot transfers at a contact guard level. STAR team will continue to work towards independence, but has also reached out to [facility] about starting process for ALF placement.    If plan is discharge home, recommend the following: A lot of help with bathing/dressing/bathroom;Supervision due to cognitive status;Help with stairs or ramp for entrance;Assistance with cooking/housework;Assist for transportation;A little help with walking and/or transfers   Can travel by private vehicle     Yes  Equipment Recommendations  BSC/3in1;Wheelchair (measurements PT);Wheelchair cushion (measurements PT);Other (comment)       Precautions / Restrictions Precautions Precautions:  Fall;Other (comment) Recall of Precautions/Restrictions: Impaired Precaution/Restrictions Comments: R BKA- no pillows under knee Other Brace: Dr Harden discontinued limb protector 8/21 Restrictions Weight Bearing Restrictions Per Provider Order: Yes RLE Weight Bearing Per Provider Order: Non weight bearing     Mobility  Transfers Overall transfer level: Needs assistance Equipment used: None Transfers: Sit to/from Stand, Bed to chair/wheelchair/BSC Sit to Stand: Contact guard assist, From elevated surface   Step pivot transfers: Contact guard assist Squat pivot transfers: Contact guard assist     General transfer comment: pt with much stronger transfers today able to stand in RW withCGA for stand pivot to wheelchair and to recliner, practiced squat pivot transfer to his L today and was much more successful than yesterday only needing CGA            Wheelchair Mobility Wheelchair Mobility Wheelchair mobility: Yes Wheelchair propulsion: Both upper extremities, Left lower extremity Wheelchair parts: Supervision/cueing Distance: 80 (+50, +20) Wheelchair Assistance Details (indicate cue type and reason): pt limited in mobility by shoulder soreness. but is able to progress with rest breaks. worked on slowed descent down a grade. Cues for use of hands on wheels and foot to slow wheelchair movement. Pt pushed back up grade       Balance Overall balance assessment: Needs assistance Sitting-balance support: No upper extremity supported, Feet supported Sitting balance-Leahy Scale: Good Sitting balance - Comments: able to perform self care tasks seated on EOB without LOB   Standing balance support: Single extremity supported, Bilateral upper extremity supported, During functional activity Standing balance-Leahy Scale: Poor Standing balance comment: Contact guard for safety  Communication Communication Communication: Impaired Factors Affecting  Communication: Difficulty expressing self  Cognition Arousal: Alert Behavior During Therapy: Flat affect   PT - Cognitive impairments: Problem solving, Memory                       PT - Cognition Comments: pt continues to need step by step cuing for managing wheelchair and it's parts, with reminders for brake application especially with transfers. Following commands: Intact Following commands impaired: Follows one step commands with increased time    Cueing Cueing Techniques: Verbal cues     General Comments General comments (skin integrity, edema, etc.): Discussed getting leg bag to work with for self urine mangement, also discussed needs for discharge to [facility].      Pertinent Vitals/Pain Pain Assessment Pain Assessment: Faces Faces Pain Scale: Hurts a little bit Pain Location: Bil shoulder aches Pain Descriptors / Indicators: Grimacing, Tiring Pain Intervention(s): Limited activity within patient's tolerance, Monitored during session, Repositioned     PT Goals (current goals can now be found in the care plan section) Acute Rehab PT Goals PT Goal Formulation: With patient Time For Goal Achievement: 12/29/23 Potential to Achieve Goals: Good Progress towards PT goals: Progressing toward goals    Frequency    Min 2X/week       Co-evaluation PT/OT/SLP Co-Evaluation/Treatment: Yes Reason for Co-Treatment: To address functional/ADL transfers;Other (comment) PT goals addressed during session: Mobility/safety with mobility        AM-PAC PT 6 Clicks Mobility   Outcome Measure  Help needed turning from your back to your side while in a flat bed without using bedrails?: None Help needed moving from lying on your back to sitting on the side of a flat bed without using bedrails?: None Help needed moving to and from a bed to a chair (including a wheelchair)?: A Little Help needed standing up from a chair using your arms (e.g., wheelchair or bedside  chair)?: A Little Help needed to walk in hospital room?: A Lot Help needed climbing 3-5 steps with a railing? : Total 6 Click Score: 17    End of Session   Activity Tolerance: Patient limited by fatigue Patient left: with call bell/phone within reach;in chair;with chair alarm set Nurse Communication: Mobility status PT Visit Diagnosis: Unsteadiness on feet (R26.81);Difficulty in walking, not elsewhere classified (R26.2);Other abnormalities of gait and mobility (R26.89);Muscle weakness (generalized) (M62.81) Pain - Right/Left: Right Pain - part of body: Leg     Time: 0912-1007 PT Time Calculation (min) (ACUTE ONLY): 55 min  Charges:    $Wheel Chair Management: 23-37 mins PT General Charges $$ ACUTE PT VISIT: 1 Visit                     Lillyauna Jenkinson B. [name] PT, DPT Acute Rehabilitation Services Please use secure chat or  Call Office [phone]    [name] Fleeta [facility] 12/27/2023, 10:33 AM

## 2023-12-27 NOTE — Progress Notes (Signed)
 Occupational Therapy Treatment Patient Details Name: Derek Thompson MRN: [id number] DOB: [birth year] Today's Date: 12/27/2023   History of present illness Pt is a 63 y/o M presenting to ED on 11/02/23 with lethargy. Pt found to be tachycardic, hyperkalemic, and hyponatremic. 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   OT comments  STAR OT/PT session: Patient performed bathing and dressing seated on EOB and stood to pull up clothing with mod assist and mod assist to donn shoe. Discussed use of leg bag next session to decrease difficulty with LB dressing and increase safety with transfers. Patient demonstrated gains with step pivot transfer to wheelchair with mod assist. Patient performed wheelchair mobility with education on energy conservation.  Patient performed squat pivot transfer training once back in room for transfers to EOB and EOB to recliner. Acute OT to continue to follow with STAR program to address established goals.       If plan is discharge home, recommend the following:  A little help with walking and/or transfers;A little help with bathing/dressing/bathroom;Assistance with cooking/housework;Direct supervision/assist for medications management;Direct supervision/assist for financial management;Assist for transportation;Help with stairs or ramp for entrance   Equipment Recommendations  Other (comment) (defer)    Recommendations for Other Services      Precautions / Restrictions Precautions Precautions: Fall;Other (comment) Recall of Precautions/Restrictions: Impaired Precaution/Restrictions Comments: R BKA- no pillows under knee Other Brace: Dr Harden discontinued limb protector 8/21 Restrictions Weight Bearing  Restrictions Per Provider Order: Yes RLE Weight Bearing Per Provider Order: Non weight bearing       Mobility Bed Mobility Overal bed mobility: Modified Independent             General bed mobility comments: able to get to EOB without assistance    Transfers Overall transfer level: Needs assistance Equipment used: None, Rolling walker (2 wheels) Transfers: Sit to/from Stand, Bed to chair/wheelchair/BSC Sit to Stand: Contact guard assist, From elevated surface   Squat pivot transfers: Contact guard assist Step pivot transfers: Contact guard assist     General transfer comment: RW for transfer from EOB to wheelchair with CGA and verbal cues and squat pivot transfer to EOB and recliner     Balance Overall balance assessment: Needs assistance Sitting-balance support: No upper extremity supported, Feet supported Sitting balance-Leahy Scale: Good Sitting balance - Comments: able to perform self care tasks seated on EOB without LOB   Standing balance support: Single extremity supported, Bilateral upper extremity supported, During functional activity Standing balance-Leahy Scale: Poor Standing balance comment: Contact guard for safety                           ADL either performed or assessed with clinical judgement   ADL Overall ADL's : Needs assistance/impaired     Grooming: Wash/dry hands;Wash/dry face;Set up;Sitting Grooming Details (indicate cue type and reason): on EOB Upper Body Bathing: Set up;Sitting Upper Body Bathing Details (indicate cue type and reason): on EOB Lower Body Bathing: Minimal assistance;Sitting/lateral leans Lower Body Bathing Details (indicate cue type and reason): on EOB Upper Body Dressing : Set up;Sitting   Lower Body Dressing: Moderate assistance;Sit to/from stand Lower Body Dressing Details (indicate cue type and reason): assistance with pulling up shorts while standing and  for donning shoe on LLE               General  ADL Comments: leg bag catheter to be utilized next session to increase independence with LB dressing    Extremity/Trunk Assessment              Vision       Perception     Praxis     Communication Communication Communication: Impaired Factors Affecting Communication: Difficulty expressing self   Cognition Arousal: Alert Behavior During Therapy: Flat affect Cognition: Cognition impaired     Awareness: Intellectual awareness intact, Online awareness impaired Memory impairment (select all impairments): Short-term memory   Executive functioning impairment (select all impairments): Problem solving                   Following commands: Intact Following commands impaired: Follows one step commands with increased time      Cueing   Cueing Techniques: Verbal cues  Exercises Exercises: Other exercises Other Exercises Other Exercises: wheelchair mobility in hallway    Shoulder Instructions       General Comments patient continues to quickly fatigue following self care and wheelchair mobility    Pertinent Vitals/ Pain       Pain Assessment Pain Assessment: Faces Faces Pain Scale: Hurts a little bit Pain Location: Bil shoulder aches Pain Descriptors / Indicators: Grimacing, Tiring Pain Intervention(s): Limited activity within patient's tolerance, Monitored during session  Home Living                                          Prior Functioning/Environment              Frequency  Min 2X/week (on STAR Program)        Progress Toward Goals  OT Goals(current goals can now be found in the care plan section)  Progress towards OT goals: Progressing toward goals  Acute Rehab OT Goals Patient Stated Goal: to increase independence OT Goal Formulation: With patient Time For Goal Achievement: 01/03/24 Potential to Achieve Goals: Fair ADL Goals Pt Will Perform Grooming: with supervision;standing Pt Will Perform Lower Body Bathing:  with set-up;sitting/lateral leans Pt Will Perform Upper Body Dressing: with set-up;sitting Pt Will Perform Lower Body Dressing: with set-up;sitting/lateral leans Pt Will Transfer to Toilet: with supervision;ambulating;bedside commode Pt Will Perform Toileting - Clothing Manipulation and hygiene: with set-up;sitting/lateral leans Pt/caregiver will Perform Home Exercise Program: Increased strength;Both right and left upper extremity;With theraband;With theraputty;Independently;With written HEP provided Additional ADL Goal #1: Pt will be able to follow 1 step commands with >50% accuracy to maximize participation in ADLs  Plan      Co-evaluation    PT/OT/SLP Co-Evaluation/Treatment: Yes Reason for Co-Treatment: To address functional/ADL transfers;Other (comment) PT goals addressed during session: Mobility/safety with mobility OT goals addressed during session: ADL's and self-care      AM-PAC OT 6 Clicks Daily Activity     Outcome Measure   Help from another person eating meals?: None Help from another person taking care of personal grooming?: A Little Help from another person toileting, which includes using toliet, bedpan, or urinal?: A Lot Help from another person bathing (including washing, rinsing, drying)?: A Little Help from another person to put on and taking off regular upper body clothing?: A Little Help from another person to put on and taking off regular lower body clothing?: A Lot 6 Click Score:  17    End of Session Equipment Utilized During Treatment: Gait belt;Rolling walker (2 wheels);Other (comment) (wheelchair)  OT Visit Diagnosis: Unsteadiness on feet (R26.81);Other abnormalities of gait and mobility (R26.89);Muscle weakness (generalized) (M62.81);Other symptoms and signs involving cognitive function Pain - Right/Left:  (bilaterally) Pain - part of body: Shoulder   Activity Tolerance Patient tolerated treatment well   Patient Left in chair;with call bell/phone  within reach;with chair alarm set   Nurse Communication Mobility status        Time: 0905-1007 OT Time Calculation (min): 62 min  Charges: OT General Charges $OT Visit: 1 Visit OT Treatments $Self Care/Home Management : 23-37 mins  Dick Laine, OTA Acute Rehabilitation Services  Office [phone]   Jeb LITTIE Laine 12/27/2023, 12:14 PM

## 2023-12-27 NOTE — Progress Notes (Signed)
 HD#54 SUBJECTIVE:  Patient Summary: Derek Thompson is a 63 y.o. with a pertinent PMH of HFrEF, urinary retention s/p prostate cancer tx, chronic osteomyelitis of RLE, and CKD, who presented with weakness and admitted for hyponatremia on 7/3. Since admission, he has undergone SPEP work up for MM, R BKA, and medical optimization of his heart failure and AKI 2/2 retention. He is medically stable for discharge and awaiting SNF  Overnight Events: n/a  Interim History: The patient was seen and examined at the bedside this morning. He was resting comfortably in no acute distress. He reports that therapy is going well. All questions and concerns were addressed at this time.   OBJECTIVE:  Vital Signs: Vitals:   12/26/23 1611 12/26/23 2124 12/27/23 0514 12/27/23 0800  BP: (!) 93/56 (!) 93/57 (!) 99/58 107/67  Pulse: 91 94 99 96  Resp: 19 14 18 18   Temp: 98.6 F (37 C) 98.6 F (37 C) 98.5 F (36.9 C) 98.8 F (37.1 C)  TempSrc: Oral Oral Oral   SpO2: 99% 97% 100% 99%  Weight:      Height:       Supplemental O2: Room Air SpO2: 99 % O2 Flow Rate (L/min): 5 L/min  Filed Weights   12/11/23 0500 12/14/23 0500 12/19/23 0500  Weight: 62.1 kg 61.7 kg 62.7 kg     Intake/Output Summary (Last 24 hours) at 12/27/2023 0848 Last data filed at 12/27/2023 0510 Gross per 24 hour  Intake 577 ml  Output 500 ml  Net 77 ml   Net IO Since Admission: -48,123.5 mL [12/27/23 0848]  Physical Exam: Const: Awake, alert in NAD HENT: Normocephalic, atraumatic, mucus membranes moist Card: Regular rate and rhythm, No MRG, No pitting edema on LE's bilaterally  Resp: LCTAB, no increased work of breathing Abd: Soft, NTND Extremities: Warm, dry. Incision site on Right BKA clean, without drainage or erythema. Skin tenting noted to the bilateral lower extremities.   Patient Lines/Drains/Airways Status     Active Line/Drains/Airways     Name Placement date Placement time Site Days   Peripheral IV  12/14/23 22 G 1 Left;Lateral;Posterior Forearm 12/14/23  0747  Forearm  11   Urethral Catheter Derek Nida, RN Coude 16 Fr. 12/08/23  1826  Coude  17   Wound 11/17/23 1030 Surgical Closed Surgical Incision Knee Right 11/17/23  1030  Knee  38            Pertinent labs and imaging:     Latest Ref Rng & Units 12/25/2023   11:01 AM 12/23/2023    5:48 AM 12/14/2023    4:34 AM  CBC  WBC 4.0 - 10.5 K/uL 5.5  6.6  6.3   Hemoglobin 13.0 - 17.0 g/dL 8.0  7.8  8.5   Hematocrit 39.0 - 52.0 % 25.6  24.5  27.6   Platelets 150 - 400 K/uL 330  344  467        Latest Ref Rng & Units 12/27/2023    4:39 AM 12/26/2023    5:18 AM 12/25/2023   11:01 AM  CMP  Glucose 70 - 99 mg/dL 891  879  775   BUN 8 - 23 mg/dL 890  91  88   Creatinine 0.61 - 1.24 mg/dL 6.09  6.19  6.42   Sodium 135 - 145 mmol/L 126  127  126   Potassium 3.5 - 5.1 mmol/L 5.3  5.8  5.2   Chloride 98 - 111 mmol/L 87  91  91  CO2 22 - 32 mmol/L 26  28  27    Calcium  8.9 - 10.3 mg/dL 8.9  8.9  9.0     No results found.  ASSESSMENT/PLAN:  Assessment: Principal Problem:   Osteomyelitis of right foot (HCC) Active Problems:   CAD (coronary artery disease)   Hyperkalemia   AKI (acute kidney injury) (HCC)   Anemia   LV (left ventricular) mural thrombus without MI (HCC)   Gangrene of right foot (HCC)   Hyponatremia   HFrEF (heart failure with reduced ejection fraction) (HCC)   Chronic osteomyelitis involving lower leg, right (HCC)   Frailty   Urinary retention   Generalized weakness   Sinus tachycardia   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 (HCC)   Chronic anemia   Hypoalbuminemia   Monoclonal gammopathy   Hx of right BKA (HCC)   Moderate protein-calorie malnutrition (HCC)   Plan: #Debility  #s/p R BKA 2/2 chronic osteomyelitis #Need for rehabilitation [name] was originally admitted 7/3 for weakness and hyponatremia. He has been medically optimized from this standpoint and is stable for  discharge to SNF/rehab.   -s/p R BKA 7/18 with clear margins -Currently on [facility]AR program -Will continue to work with LCSW on disposition  #AKI on CKD #Electrolyte disturbances -Renal function continues to fluctuate, however seems to be new baseline with creatinine ~2-3. Likely affected by monoclonal gammopathy  -Creatinine continues to increase, today 3.9 with BUN 109. The patient reports that he is drinking water , however there were multiple full cups and jugs of water  on his bedside table. On exam, skin tenting was noticed bilaterally. Soft pressures and tachycardia also noted. Suspect that he is volume down. Will initiate slow fluid bolus today.  -Will also flush foley today and ensure good drainage.  -Due to worsening renal function, will obtain urine studies.  -K 5.3 today--continue lokelma  5mg  daily -Daily RFP's  #Chronic Foley Catheter #Urinary Retention #Hx of CAUTI -Pt with chronic urinary retention 2/2 to prostate cancer treatment. Coude catheter in place.  -s/p treatment for CAUTI x2 this admission -Catheter last changed 8/8--per urology, must be changed every 30 days.  -Continue foley care and to monitor for signs of UTI -Will flush foley today and ensure good drainage to rule out outlet obstruction due to renal function worsening  #T2DM -Continue 12 units lantus  daily -Farxiga  d/c due to worsening renal function  #HFrEF -Last echo 10/12/23 with EF 20-25% with global hypokinesis of LV -Continue metoprolol  succinate 50mg  daily -Further GDMT held due to renal function -Currently without exacerbation  #Elevated Protein Gap #Concern for multiple myeloma -Pt has had extensive work up for elevated protein gap since admission including SPEP with M spike, positive bence jones proteins, and BM biopsy indicative of multiple myeloma -Heme consulted and pt will follow up outpatient -Suspect this is contributing to renal function and electrolyte abnormalities in the form of  hyponatremia  Best Practice: Diet: Renal diet IVF: Fluids: none, Rate: None VTE: Eliquis   Code: Full  Disposition planning: Therapy Recs: SNF, DME: wheelchair Family Contact: Derek Thompson, called and notified. DISPO: Anticipated discharge pending insurance approval to SNF   Signature:  Schuyler Novak, DO [facility]  8:48 AM, 12/27/2023  On Call pager [phone]

## 2023-12-28 DIAGNOSIS — N179 Acute kidney failure, unspecified: Secondary | ICD-10-CM | POA: Diagnosis not present

## 2023-12-28 DIAGNOSIS — M869 Osteomyelitis, unspecified: Secondary | ICD-10-CM | POA: Diagnosis not present

## 2023-12-28 DIAGNOSIS — I5020 Unspecified systolic (congestive) heart failure: Secondary | ICD-10-CM | POA: Diagnosis not present

## 2023-12-28 DIAGNOSIS — E440 Moderate protein-calorie malnutrition: Secondary | ICD-10-CM | POA: Diagnosis not present

## 2023-12-28 LAB — RENAL FUNCTION PANEL
Albumin: 2.2 g/dL — ABNORMAL LOW (ref 3.5–5.0)
Anion gap: 12 (ref 5–15)
BUN: 116 mg/dL — ABNORMAL HIGH (ref 8–23)
CO2: 27 mmol/L (ref 22–32)
Calcium: 9.2 mg/dL (ref 8.9–10.3)
Chloride: 91 mmol/L — ABNORMAL LOW (ref 98–111)
Creatinine, Ser: 3.48 mg/dL — ABNORMAL HIGH (ref 0.61–1.24)
GFR, Estimated: 19 mL/min — ABNORMAL LOW (ref >60)
Glucose, Bld: 102 mg/dL — ABNORMAL HIGH (ref 70–99)
Phosphorus: 4.7 mg/dL — ABNORMAL HIGH (ref 2.5–4.6)
Potassium: 4.8 mmol/L (ref 3.5–5.1)
Sodium: 130 mmol/L — ABNORMAL LOW (ref 135–145)

## 2023-12-28 LAB — CBC
HCT: 25.4 % — ABNORMAL LOW (ref 39.0–52.0)
Hemoglobin: 8.2 g/dL — ABNORMAL LOW (ref 13.0–17.0)
MCH: 27.4 pg (ref 26.0–34.0)
MCHC: 32.3 g/dL (ref 30.0–36.0)
MCV: 84.9 fL (ref 80.0–100.0)
Platelets: 329 K/uL (ref 150–400)
RBC: 2.99 MIL/uL — ABNORMAL LOW (ref 4.22–5.81)
RDW: 17.6 % — ABNORMAL HIGH (ref 11.5–15.5)
WBC: 5.7 K/uL (ref 4.0–10.5)
nRBC: 0 % (ref 0.0–0.2)

## 2023-12-28 LAB — URINE CULTURE: Culture: NO GROWTH

## 2023-12-28 LAB — GLUCOSE, CAPILLARY
Glucose-Capillary: 145 mg/dL — ABNORMAL HIGH (ref 70–99)
Glucose-Capillary: 175 mg/dL — ABNORMAL HIGH (ref 70–99)
Glucose-Capillary: 242 mg/dL — ABNORMAL HIGH (ref 70–99)
Glucose-Capillary: 124 mg/dL — ABNORMAL HIGH (ref 70–99)

## 2023-12-28 LAB — UREA NITROGEN, URINE: Urea Nitrogen, Ur: 382 mg/dL

## 2023-12-28 MED ORDER — MIRTAZAPINE 15 MG PO TABS
15.0000 mg | ORAL_TABLET | Freq: Every day | ORAL | Status: DC
  Administered 2023-12-28: 15 mg via ORAL
  Filled 2023-12-28: qty 1

## 2023-12-28 MED ORDER — POLYETHYLENE GLYCOL 3350 17 G PO PACK
17.0000 g | PACK | Freq: Every day | ORAL | Status: DC | PRN
  Administered 2023-12-29: 17 g via ORAL
  Filled 2023-12-28: qty 1

## 2023-12-28 NOTE — Plan of Care (Signed)
   Problem: Coping: Goal: Level of anxiety will decrease Outcome: Completed/Met

## 2023-12-28 NOTE — Progress Notes (Signed)
 HD#55 SUBJECTIVE:  Patient Summary: Derek Thompson is a 63 y.o. with a pertinent PMH of HFrEF, urinary retention s/p prostate cancer tx, chronic osteomyelitis of RLE, and CKD, who presented with weakness and admitted for hyponatremia on 7/3. Since admission, he has undergone SPEP work up for MM, R BKA, and medical optimization of his heart failure and AKI 2/2 retention. He is medically stable for discharge and awaiting SNF  Overnight Events: n/a  Interim History: The patient was seen and examined at the bedside this morning. He reports that he is feeling much better overall today and feels like he has more energy. He reports that he has always had difficulty eating large meals, so he nibbles throughout the day, but that this is not new to this admission.   OBJECTIVE:  Vital Signs: Vitals:   12/27/23 0800 12/27/23 2036 12/28/23 0500 12/28/23 0557  BP: 107/67 111/70  (P) 135/74  Pulse: 96 96  (P) 87  Resp: 18 18  (P) 18  Temp: 98.8 F (37.1 C) 98.3 F (36.8 C) (P) 98.4 F (36.9 C) (P) 98.4 F (36.9 C)  TempSrc:  Oral (P) Oral (P) Oral  SpO2: 99% 100%    Weight:      Height:       Supplemental O2: Room Air   Filed Weights   12/11/23 0500 12/14/23 0500 12/19/23 0500  Weight: 62.1 kg 61.7 kg 62.7 kg     Intake/Output Summary (Last 24 hours) at 12/28/2023 0854 Last data filed at 12/27/2023 2135 Gross per 24 hour  Intake --  Output 1200 ml  Net -1200 ml   Net IO Since Admission: -49,323.5 mL [12/28/23 0854]  Physical Exam: Const: Awake, alert in NAD HENT: Normocephalic, atraumatic, mucus membranes moist Card: Regular rate and rhythm, No MRG, No pitting edema on LE's bilaterally  Resp: No increased work of breathing, mild basilar crackles present in LLL Abd: Soft, NTND Extremities: Warm, dry. Incision site on Right BKA clean, without drainage or erythema. Skin tenting improved from yesterday, but still noted to the bilateral lower extremities.   Patient  Lines/Drains/Airways Status     Active Line/Drains/Airways     Name Placement date Placement time Site Days   Peripheral IV 12/14/23 22 G 1 Left;Lateral;Posterior Forearm 12/14/23  0747  Forearm  11   Urethral Catheter Mekides Nida, RN Coude 16 Fr. 12/08/23  1826  Coude  17   Wound 11/17/23 1030 Surgical Closed Surgical Incision Knee Right 11/17/23  1030  Knee  38            Pertinent labs and imaging:     Latest Ref Rng & Units 12/28/2023    5:31 AM 12/25/2023   11:01 AM 12/23/2023    5:48 AM  CBC  WBC 4.0 - 10.5 K/uL 5.7  5.5  6.6   Hemoglobin 13.0 - 17.0 g/dL 8.2  8.0  7.8   Hematocrit 39.0 - 52.0 % 25.4  25.6  24.5   Platelets 150 - 400 K/uL 329  330  344        Latest Ref Rng & Units 12/28/2023    5:31 AM 12/27/2023    4:39 AM 12/26/2023    5:18 AM  CMP  Glucose 70 - 99 mg/dL 897  891  879   BUN 8 - 23 mg/dL 883  890  91   Creatinine 0.61 - 1.24 mg/dL 6.51  6.09  6.19   Sodium 135 - 145 mmol/L 130  126  127  Potassium 3.5 - 5.1 mmol/L 4.8  5.3  5.8   Chloride 98 - 111 mmol/L 91  87  91   CO2 22 - 32 mmol/L 27  26  28    Calcium  8.9 - 10.3 mg/dL 9.2  8.9  8.9     No results found.  ASSESSMENT/PLAN:  Assessment: Principal Problem:   Osteomyelitis of right foot (HCC) Active Problems:   CAD (coronary artery disease)   Hyperkalemia   AKI (acute kidney injury) (HCC)   Anemia   LV (left ventricular) mural thrombus without MI (HCC)   Gangrene of right foot (HCC)   Hyponatremia   HFrEF (heart failure with reduced ejection fraction) (HCC)   Chronic osteomyelitis involving lower leg, right (HCC)   Frailty   Urinary retention   Generalized weakness   Sinus tachycardia   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 (HCC)   Chronic anemia   Hypoalbuminemia   Monoclonal gammopathy   Hx of right BKA (HCC)   Moderate protein-calorie malnutrition (HCC)   Plan: #AKI on CKD #Electrolyte disturbances--hyponatremia, hyperkalemia -Renal function  continues to fluctuate, however seems to be new baseline with creatinine ~2-3. Likely affected by monoclonal gammopathy  -Creatinine mildly improved today after fluids at 3.48 with concomitant improvement in Na at 130. BUN 116 today. Suspect his decreased renal function over the past few weeks was due to dehydration and poor renal perfusion. On exam today, mild basilar crackles noted in the LLL, will hold off on IV fluids and push oral rehydration today. -Foley flushed and irrigated yesterday, so do not suspect obstruction -Urine studies resulted with signs of dehydration and poor urine concentration due to decreased solutes, likely from poor appetite and oral intake -K improved today at 4.8 -Suspect hyponatremia is exacerbated by monoclonal gammopathy in the form of pseudohyponatremia -Daily RFP's  #Debility  #s/p R BKA 2/2 chronic osteomyelitis #Need for rehabilitation -s/p R BKA 7/18 with clear margins -Currently on [facility] program and participating well with therapy.  -Will continue to work with LCSW on disposition  #Chronic Foley Catheter #Urinary Retention #Hx of CAUTI -Pt with chronic urinary retention 2/2 to prostate cancer treatment. Coude catheter in place.  -s/p treatment for CAUTI x2 this admission -Catheter last changed 8/8--per urology, must be changed every 30 days.  -Continue foley care and to monitor for signs of UTI -Foley flushed and irrigated yesterday.   #T2DM -Continue 12 units lantus  daily -Farxiga  d/c due to worsening renal function  #HFrEF -Last echo 10/12/23 with EF 20-25% with global hypokinesis of LV -Continue metoprolol  succinate 50mg  daily -Further GDMT held due to renal function -Currently without exacerbation  #Elevated Protein Gap #Concern for multiple myeloma -Pt has had extensive work up for elevated protein gap since admission including SPEP with M spike, positive bence jones proteins, and BM biopsy indicative of multiple myeloma -Heme consulted  and pt will follow up outpatient -Suspect this is contributing to renal function and electrolyte abnormalities in the form of hyponatremia  Best Practice: Diet: Renal diet IVF: Fluids: none, Rate: None VTE: Eliquis   Code: Full  Disposition planning: Therapy Recs: SNF, DME: wheelchair Family Contact: [name] Kitty, called and notified. DISPO: Anticipated discharge pending insurance approval to SNF   Signature:  Schuyler Novak, DO [facility]  8:54 AM, 12/28/2023  On Call pager [phone]

## 2023-12-28 NOTE — Progress Notes (Signed)
 Physical Therapy Treatment Patient Details Name: Derek Thompson MRN: [id number] DOB: 1960/09/05 Today's Date: 12/28/2023   History of Present Illness Pt is a 63 y/o M presenting to ED on 11/02/23 with lethargy. Pt found to be tachycardic, hyperkalemic, and hyponatremic. 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    PT Comments  STAR PT/OT Session: Pt up with OT finishing getting dressed. Pt using urinary leg bag today to maximize mobility. Focus of session on use of parallel bars to progress mobility and strength utilizing UE. Pt is able to perform multiple bouts of hopping with attention to not landing on L LE with knee in locked position. Pt is able to absorb shock with eccentric muscle activation. Pt also worked on core strengthening at [employer] And on wheelchair mobility on the ramp. Continue to progress mobility in standing and with wheelchair. D/c plans remain appropriate until pt can be independent with mobility and self care.     If plan is discharge home, recommend the following: A lot of help with bathing/dressing/bathroom;Supervision due to cognitive status;Help with stairs or ramp for entrance;Assistance with cooking/housework;Assist for transportation;A little help with walking and/or transfers   Can travel by private vehicle     Yes  Equipment Recommendations  BSC/3in1;Wheelchair (measurements PT);Wheelchair cushion (measurements PT);Other (comment)       Precautions / Restrictions Precautions Precautions: Fall;Other (comment) Recall of Precautions/Restrictions: Impaired Precaution/Restrictions Comments: R BKA- no pillows under knee Other Brace: prosthetist request use of limb protector in bed to keep knee  extension at night Restrictions Weight Bearing Restrictions Per Provider Order: Yes RLE Weight Bearing Per Provider Order: Non weight bearing Other Position/Activity Restrictions: shrinker     Mobility  Bed Mobility Overal bed mobility: Modified Independent             General bed mobility comments: able to get to EOB without assistance    Transfers Overall transfer level: Needs assistance Equipment used: None Transfers: Sit to/from Stand, Bed to chair/wheelchair/BSC Sit to Stand: Contact guard assist, From elevated surface     Squat pivot transfers: Contact guard assist     General transfer comment: squat pivot transfers performed from different surfaces    Ambulation/Gait Ambulation/Gait assistance: Min assist, +2 safety/equipment Gait Distance (Feet): 8 Feet (x10) Assistive device:  (parallel bars) Gait Pattern/deviations:  (hop to pattern) Gait velocity: decreased Gait velocity interpretation: <1.31 ft/sec, indicative of household ambulator   General Gait Details: utilized parallel bars to progress hopping mobility, vc for decreased knee lock out for stability to protect knee joint and use LE muscles to absorb shock     [employer]chant [employer] officer mobility: Yes Wheelchair propulsion: Both upper extremities, Left lower extremity Wheelchair parts: Needs assistance Distance: 12 (x4) Wheelchair Assistance Details (indicate cue type and reason): practiced going up and down ramp safely, using hands on wheel to decrease speed in descent         Balance Overall balance assessment: Needs assistance Sitting-balance support: No upper extremity supported, Feet supported Sitting balance-Leahy Scale: Good     Standing balance support: Single extremity supported, Bilateral upper extremity supported, During functional activity Standing balance-Leahy Scale: Poor Standing balance comment: one extremity support during transfers. performed  mobility in parallel bars with BUE support  Communication Communication Communication: Impaired Factors Affecting Communication: Difficulty expressing self  Cognition Arousal: Alert Behavior During Therapy: Flat affect   PT - Cognitive impairments: Problem solving                       PT - Cognition Comments: pt notes during session today that his vision is bad and that he has not been able to get a new glass prescription which makes it hard to do things like put the foot rests on the wheelchair Following commands: Intact Following commands impaired: Follows one step commands with increased time    Cueing Cueing Techniques: Verbal cues  Exercises Other Exercises Other Exercises: 10x ball rebounder to work on core strength and leaning outside [facility] Comments General comments (skin integrity, edema, etc.): VSS on RA      Pertinent Vitals/Pain Pain Assessment Pain Assessment: Faces Faces Pain Scale: Hurts a little bit Pain Location: Bil shoulder aches Pain Descriptors / Indicators: Grimacing, Tiring Pain Intervention(s): Limited activity within patient's tolerance, Monitored during session, Repositioned     PT Goals (current goals can now be found in the care plan section) Acute Rehab PT Goals PT Goal Formulation: With patient Time For Goal Achievement: 12/29/23 Potential to Achieve Goals: Good Progress towards PT goals: Progressing toward goals    Frequency    Min 2X/week           Co-evaluation PT/OT/SLP Co-Evaluation/Treatment: Yes Reason for Co-Treatment: To address functional/ADL transfers;Other (comment) (decreased activity tolerance) PT goals addressed during session: Mobility/safety with mobility OT goals addressed during session: ADL's and self-care      AM-PAC PT 6 Clicks Mobility   Outcome Measure  Help needed turning from your back to your side while in a flat bed without using bedrails?:  None Help needed moving from lying on your back to sitting on the side of a flat bed without using bedrails?: None Help needed moving to and from a bed to a chair (including a wheelchair)?: A Little Help needed standing up from a chair using your arms (e.g., wheelchair or bedside chair)?: A Little Help needed to walk in hospital room?: A Lot Help needed climbing 3-5 steps with a railing? : Total 6 Click Score: 17    End of Session Equipment Utilized During Treatment: Gait belt Activity Tolerance: Patient limited by fatigue Patient left: with call bell/phone within reach;in chair;with chair alarm set Nurse Communication: Mobility status PT Visit Diagnosis: Unsteadiness on feet (R26.81);Difficulty in walking, not elsewhere classified (R26.2);Other abnormalities of gait and mobility (R26.89);Muscle weakness (generalized) (M62.81) Pain - Right/Left: Right Pain - part of body: Leg     Time: 1206-1258 PT Time Calculation (min) (ACUTE ONLY): 52 min  Charges:    $Gait Training: 8-22 mins $Therapeutic Activity: 8-22 mins $Wheel Chair Management: 8-22 mins PT General Charges $$ ACUTE PT VISIT: 1 Visit                     Leina Babe B. [name] PT, DPT Acute Rehabilitation Services Please use secure chat or  Call Office [phone]    [name] Fleeta [facility] 12/28/2023, 1:55 PM

## 2023-12-28 NOTE — TOC Progression Note (Signed)
 Transition of Care [facility]) - Progression Note    Patient Details  Name: Jaelyn Cloninger MRN: [id number] Date of Birth: 17-Aug-1960  [facility]) CM/SW Contact  Lendia Dais, [place] Phone Number: 12/28/2023, 8:37 AM  Clinical Narrative:  CSW contacted the following facilities  [facility]: Spoke to [place] and stated the facility has no bed available.  [facility]: Transferred to admissions no answer was received. No option to leave VM.  [facility]: Admissions Interior and spatial designer was not in yet, CSW left a message with the receptionist.  [facility] - Left a VM for admissions director  [facility] - LVM for the admissions coordinator  [facility] [employer]. Bed offer was declined due to the facility not being able to discharge the patient to a homeless center.  [facility]: Left a VM with admissions Interior and spatial designer.      Expected Discharge Plan: Skilled Nursing Facility Barriers to Discharge: Insurance Authorization               Expected Discharge Plan and Services In-house Referral: Clinical Social Work   Post Acute Care Choice: Skilled Nursing Facility Living arrangements for the past 2 months: Homeless Shelter                                       Social Drivers of Health (SDOH) Interventions SDOH Screenings   Food Insecurity: No Food Insecurity (11/02/2023)  Housing: High Risk (11/02/2023)  Transportation Needs: Unmet Transportation Needs (11/02/2023)  Utilities: Not At Risk (11/02/2023)  Alcohol Screen: Low Risk  (08/19/2022)  Depression (PHQ2-9): Low Risk  (10/11/2023)  Financial Resource Strain: Low Risk  (08/19/2022)  Tobacco Use: Medium Risk (11/17/2023)    Readmission Risk Interventions     No data to display

## 2023-12-28 NOTE — Progress Notes (Signed)
 Occupational Therapy Treatment Patient Details Name: Derek Thompson MRN: [id number] DOB: 02/29/1961 Today's Date: 12/28/2023   History of present illness Pt is a 63 y/o M presenting to ED on 11/02/23 with lethargy. Pt found to be tachycardic, hyperkalemic, and hyponatremic. 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   OT comments  [facility] OT/PT session:  Leg bag placed on patient for catheter with education on how to change for patient education.  Patient able to donn shorts with less difficulty with leg bag. Elastic shoelaces added to shoes with patient able to donn with min assist. Patient participated in wheelchair mobility on ramp and transfer training in parallel bars to increase independence with wheelchair mobility and transfers and increase activity tolerance.  Patient will benefit from continued inpatient follow up therapy, <3 hours/day.  Acute OT to continue to follow to address established goals to facilitate DC to next venue of care.        If plan is discharge home, recommend the following:  A little help with walking and/or transfers;A little help with bathing/dressing/bathroom;Assistance with cooking/housework;Direct supervision/assist for medications management;Direct supervision/assist for financial management;Assist for transportation;Help with stairs or ramp for entrance   Equipment Recommendations  Other (comment) (defer)    Recommendations for Other Services      Precautions / Restrictions Precautions Precautions: Fall;Other (comment) Recall of Precautions/Restrictions: Impaired Precaution/Restrictions Comments: R BKA- no pillows under knee Other Brace: Dr Harden discontinued limb protector  8/21 Restrictions Weight Bearing Restrictions Per Provider Order: Yes RLE Weight Bearing Per Provider Order: Non weight bearing Other Position/Activity Restrictions: shrinker       Mobility Bed Mobility Overal bed mobility: Modified Independent             General bed mobility comments: able to get to EOB without assistance    Transfers Overall transfer level: Needs assistance Equipment used: None Transfers: Sit to/from Stand, Bed to chair/wheelchair/BSC Sit to Stand: Contact guard assist, From elevated surface   Squat pivot transfers: Contact guard assist       General transfer comment: squat pivot transfers performed from different surfaces     Balance Overall balance assessment: Needs assistance Sitting-balance support: No upper extremity supported, Feet supported Sitting balance-Leahy Scale: Good     Standing balance support: Single extremity supported, Bilateral upper extremity supported, During functional activity Standing balance-Leahy Scale: Poor Standing balance comment: one extremity support during transfers. performed mobility in parallel bars with BUE support                           ADL either performed or assessed with clinical judgement   ADL Overall ADL's : Needs assistance/impaired                 Upper Body Dressing : Set up;Sitting   Lower Body Dressing: Minimal assistance;Sitting/lateral leans Lower Body Dressing Details (indicate cue type and reason): able to donn shorts seated on EOB and leaning side to side to pull up. Able to donn shoes with elastic shoelaces               General ADL Comments: catheter leg bag used today to assist with increasing independence with LB dressing    Extremity/Trunk Assessment  Vision       Perception     Praxis     Communication Communication Communication: Impaired Factors Affecting Communication: Difficulty expressing self   Cognition Arousal:  Alert Behavior During Therapy: Flat affect Cognition: Cognition impaired     Awareness: Intellectual awareness intact, Online awareness impaired Memory impairment (select all impairments): Short-term memory   Executive functioning impairment (select all impairments): Problem solving                   Following commands: Intact Following commands impaired: Follows one step commands with increased time      Cueing   Cueing Techniques: Verbal cues  Exercises      Shoulder Instructions       General Comments education provided on donn leg bag for catheter and how to drain    Pertinent Vitals/ Pain       Pain Assessment Pain Assessment: Faces Faces Pain Scale: Hurts a little bit Pain Location: Bil shoulder aches Pain Descriptors / Indicators: Grimacing, Tiring Pain Intervention(s): Limited activity within patient's tolerance, Monitored during session, Repositioned  Home Living                                          Prior Functioning/Environment              Frequency  Min 2X/week (on [facility] Program)        Progress Toward Goals  OT Goals(current goals can now be found in the care plan section)  Progress towards OT goals: Progressing toward goals  Acute Rehab OT Goals Patient Stated Goal: to get stronger OT Goal Formulation: With patient Time For Goal Achievement: 01/03/24 Potential to Achieve Goals: Fair ADL Goals Pt Will Perform Grooming: with supervision;standing Pt Will Perform Lower Body Bathing: with set-up;sitting/lateral leans Pt Will Perform Upper Body Dressing: with set-up;sitting Pt Will Perform Lower Body Dressing: with set-up;sitting/lateral leans Pt Will Transfer to Toilet: with supervision;ambulating;bedside commode Pt Will Perform Toileting - Clothing Manipulation and hygiene: with set-up;sitting/lateral leans Pt/caregiver will Perform Home Exercise Program: Increased strength;Both right and left upper  extremity;With theraband;With theraputty;Independently;With written HEP provided Additional ADL Goal #1: Pt will be able to follow 1 step commands with >50% accuracy to maximize participation in ADLs  Plan      Co-evaluation    PT/OT/SLP Co-Evaluation/Treatment: Yes Reason for Co-Treatment: To address functional/ADL transfers;Other (comment) (decreased activity tolerance) PT goals addressed during session: Mobility/safety with mobility OT goals addressed during session: ADL's and self-care      AM-PAC OT 6 Clicks Daily Activity     Outcome Measure   Help from another person eating meals?: None Help from another person taking care of personal grooming?: A Little Help from another person toileting, which includes using toliet, bedpan, or urinal?: A Lot Help from another person bathing (including washing, rinsing, drying)?: A Little Help from another person to put on and taking off regular upper body clothing?: A Little Help from another person to put on and taking off regular lower body clothing?: A Lot 6 Click Score: 17    End of Session Equipment Utilized During Treatment: Gait belt;Other (comment) (wheelchair, parallel bars)  OT Visit Diagnosis: Unsteadiness on feet (R26.81);Other abnormalities of gait and mobility (R26.89);Muscle weakness (generalized) (M62.81);Other symptoms and signs involving cognitive function Pain - Right/Left:  (bilateral) Pain - part of body: Shoulder   Activity Tolerance Patient tolerated treatment  well   Patient Left in chair;with call bell/phone within reach;with chair alarm set   Nurse Communication Mobility status        Time: 8843-8741 OT Time Calculation (min): 62 min  Charges: OT General Charges $OT Visit: 1 Visit OT Treatments $Self Care/Home Management : 8-22 mins $Therapeutic Activity: 8-22 mins  Dick Laine, OTA Acute Rehabilitation Services  Office [phone]   Jeb LITTIE Laine 12/28/2023, 1:38 PM

## 2023-12-28 NOTE — Plan of Care (Signed)
  Problem: Activity: Goal: Risk for activity intolerance will decrease Outcome: Progressing   Problem: Coping: Goal: Level of anxiety will decrease Outcome: Progressing   Problem: Elimination: Goal: Will not experience complications related to bowel motility Outcome: Progressing Goal: Will not experience complications related to urinary retention Outcome: Progressing   Problem: Education: Goal: Individualized Educational Video(s) Outcome: Progressing   Problem: Fluid Volume: Goal: Ability to maintain a balanced intake and output will improve Outcome: Progressing   Problem: Health Behavior/Discharge Planning: Goal: Ability to identify and utilize available resources and services will improve Outcome: Progressing Goal: Ability to manage health-related needs will improve Outcome: Progressing   Problem: Metabolic: Goal: Ability to maintain appropriate glucose levels will improve Outcome: Progressing

## 2023-12-29 ENCOUNTER — Inpatient Hospital Stay (HOSPITAL_COMMUNITY): Payer: MEDICAID

## 2023-12-29 DIAGNOSIS — R570 Cardiogenic shock: Secondary | ICD-10-CM | POA: Diagnosis not present

## 2023-12-29 DIAGNOSIS — R319 Hematuria, unspecified: Secondary | ICD-10-CM

## 2023-12-29 DIAGNOSIS — R569 Unspecified convulsions: Secondary | ICD-10-CM | POA: Diagnosis not present

## 2023-12-29 DIAGNOSIS — R4182 Altered mental status, unspecified: Secondary | ICD-10-CM

## 2023-12-29 DIAGNOSIS — R578 Other shock: Secondary | ICD-10-CM

## 2023-12-29 DIAGNOSIS — G9341 Metabolic encephalopathy: Secondary | ICD-10-CM

## 2023-12-29 DIAGNOSIS — N179 Acute kidney failure, unspecified: Secondary | ICD-10-CM | POA: Diagnosis not present

## 2023-12-29 DIAGNOSIS — N1832 Chronic kidney disease, stage 3b: Secondary | ICD-10-CM | POA: Diagnosis not present

## 2023-12-29 DIAGNOSIS — R579 Shock, unspecified: Secondary | ICD-10-CM | POA: Diagnosis not present

## 2023-12-29 DIAGNOSIS — M869 Osteomyelitis, unspecified: Secondary | ICD-10-CM | POA: Diagnosis not present

## 2023-12-29 LAB — BASIC METABOLIC PANEL WITH GFR
Anion gap: 14 (ref 5–15)
Anion gap: 12 (ref 5–15)
BUN: 123 mg/dL — ABNORMAL HIGH (ref 8–23)
BUN: 125 mg/dL — ABNORMAL HIGH (ref 8–23)
CO2: 21 mmol/L — ABNORMAL LOW (ref 22–32)
CO2: 21 mmol/L — ABNORMAL LOW (ref 22–32)
Calcium: 8 mg/dL — ABNORMAL LOW (ref 8.9–10.3)
Calcium: 7.7 mg/dL — ABNORMAL LOW (ref 8.9–10.3)
Chloride: 87 mmol/L — ABNORMAL LOW (ref 98–111)
Chloride: 88 mmol/L — ABNORMAL LOW (ref 98–111)
Creatinine, Ser: 4.07 mg/dL — ABNORMAL HIGH (ref 0.61–1.24)
Creatinine, Ser: 4.5 mg/dL — ABNORMAL HIGH (ref 0.61–1.24)
GFR, Estimated: 16 mL/min — ABNORMAL LOW (ref >60)
GFR, Estimated: 14 mL/min — ABNORMAL LOW (ref >60)
Glucose, Bld: 159 mg/dL — ABNORMAL HIGH (ref 70–99)
Glucose, Bld: 185 mg/dL — ABNORMAL HIGH (ref 70–99)
Potassium: 5.2 mmol/L — ABNORMAL HIGH (ref 3.5–5.1)
Potassium: 5.3 mmol/L — ABNORMAL HIGH (ref 3.5–5.1)
Sodium: 122 mmol/L — ABNORMAL LOW (ref 135–145)
Sodium: 121 mmol/L — ABNORMAL LOW (ref 135–145)

## 2023-12-29 LAB — ECHOCARDIOGRAM COMPLETE
Est EF: <20
Height: 72 in
MV M vel: 3.66 m/s
MV Peak grad: 53.6 mmHg
S' Lateral: 5 cm
Weight: 2211.65 [oz_av]

## 2023-12-29 LAB — CBC
HCT: 20.7 % — ABNORMAL LOW (ref 39.0–52.0)
Hemoglobin: 6.7 g/dL — CL (ref 13.0–17.0)
MCH: 27.5 pg (ref 26.0–34.0)
MCHC: 32.4 g/dL (ref 30.0–36.0)
MCV: 84.8 fL (ref 80.0–100.0)
Platelets: 308 K/uL (ref 150–400)
RBC: 2.44 MIL/uL — ABNORMAL LOW (ref 4.22–5.81)
RDW: 18.1 % — ABNORMAL HIGH (ref 11.5–15.5)
WBC: 22.5 K/uL — ABNORMAL HIGH (ref 4.0–10.5)
nRBC: 0 % (ref 0.0–0.2)

## 2023-12-29 LAB — CBC WITH DIFFERENTIAL/PLATELET
Abs Immature Granulocytes: 0.1 K/uL — ABNORMAL HIGH (ref 0.00–0.07)
Basophils Absolute: 0 K/uL (ref 0.0–0.1)
Basophils Relative: 0 %
Eosinophils Absolute: 0 K/uL (ref 0.0–0.5)
Eosinophils Relative: 0 %
HCT: 24.5 % — ABNORMAL LOW (ref 39.0–52.0)
Hemoglobin: 8 g/dL — ABNORMAL LOW (ref 13.0–17.0)
Immature Granulocytes: 1 %
Lymphocytes Relative: 2 %
Lymphs Abs: 0.4 K/uL — ABNORMAL LOW (ref 0.7–4.0)
MCH: 27.5 pg (ref 26.0–34.0)
MCHC: 32.7 g/dL (ref 30.0–36.0)
MCV: 84.2 fL (ref 80.0–100.0)
Monocytes Absolute: 0.9 K/uL (ref 0.1–1.0)
Monocytes Relative: 5 %
Neutro Abs: 16.7 K/uL — ABNORMAL HIGH (ref 1.7–7.7)
Neutrophils Relative %: 92 %
Platelets: 292 K/uL (ref 150–400)
RBC: 2.91 MIL/uL — ABNORMAL LOW (ref 4.22–5.81)
RDW: 17.9 % — ABNORMAL HIGH (ref 11.5–15.5)
WBC: 18 K/uL — ABNORMAL HIGH (ref 4.0–10.5)
nRBC: 0 % (ref 0.0–0.2)

## 2023-12-29 LAB — RESPIRATORY PANEL BY PCR

## 2023-12-29 LAB — COOXEMETRY PANEL
Carboxyhemoglobin: 1.7 % — ABNORMAL HIGH (ref 0.5–1.5)
Carboxyhemoglobin: <0.3 % — ABNORMAL LOW (ref 0.5–1.5)
Carboxyhemoglobin: 1.7 % — ABNORMAL HIGH (ref 0.5–1.5)
Methemoglobin: <0.7 % (ref 0.0–1.5)
Methemoglobin: <0.7 % (ref 0.0–1.5)
Methemoglobin: <0.7 % (ref 0.0–1.5)
O2 Saturation: 44.6 %
O2 Saturation: 26.3 %
O2 Saturation: 55.7 %
Total hemoglobin: <6.9 g/dL — CL (ref 12.0–16.0)
Total hemoglobin: <6.9 g/dL — CL (ref 12.0–16.0)
Total hemoglobin: 7.7 g/dL — ABNORMAL LOW (ref 12.0–16.0)

## 2023-12-29 LAB — POCT I-STAT 7, (LYTES, BLD GAS, ICA,H+H)
Acid-base deficit: 7 mmol/L — ABNORMAL HIGH (ref 0.0–2.0)
Bicarbonate: 15.9 mmol/L — ABNORMAL LOW (ref 20.0–28.0)
Calcium, Ion: 1.08 mmol/L — ABNORMAL LOW (ref 1.15–1.40)
HCT: 22 % — ABNORMAL LOW (ref 39.0–52.0)
Hemoglobin: 7.5 g/dL — ABNORMAL LOW (ref 13.0–17.0)
O2 Saturation: 98 %
Patient temperature: 98.3
Potassium: 6.2 mmol/L — ABNORMAL HIGH (ref 3.5–5.1)
Sodium: 121 mmol/L — ABNORMAL LOW (ref 135–145)
TCO2: 17 mmol/L — ABNORMAL LOW (ref 22–32)
pCO2 arterial: 20.9 mmHg — ABNORMAL LOW (ref 32–48)
pH, Arterial: 7.488 — ABNORMAL HIGH (ref 7.35–7.45)
pO2, Arterial: 98 mmHg (ref 83–108)

## 2023-12-29 LAB — HEPATIC FUNCTION PANEL
ALT: 23 U/L (ref 0–44)
AST: 28 U/L (ref 15–41)
Albumin: 2.3 g/dL — ABNORMAL LOW (ref 3.5–5.0)
Alkaline Phosphatase: 51 U/L (ref 38–126)
Bilirubin, Direct: 0.1 mg/dL (ref 0.0–0.2)
Indirect Bilirubin: 0.7 mg/dL (ref 0.3–0.9)
Total Bilirubin: 0.8 mg/dL (ref 0.0–1.2)
Total Protein: 8.3 g/dL — ABNORMAL HIGH (ref 6.5–8.1)

## 2023-12-29 LAB — GLUCOSE, CAPILLARY
Glucose-Capillary: 219 mg/dL — ABNORMAL HIGH (ref 70–99)
Glucose-Capillary: 153 mg/dL — ABNORMAL HIGH (ref 70–99)
Glucose-Capillary: 167 mg/dL — ABNORMAL HIGH (ref 70–99)
Glucose-Capillary: 181 mg/dL — ABNORMAL HIGH (ref 70–99)
Glucose-Capillary: 161 mg/dL — ABNORMAL HIGH (ref 70–99)

## 2023-12-29 LAB — RENAL FUNCTION PANEL
Albumin: 2.3 g/dL — ABNORMAL LOW (ref 3.5–5.0)
Anion gap: 17 — ABNORMAL HIGH (ref 5–15)
BUN: 126 mg/dL — ABNORMAL HIGH (ref 8–23)
CO2: 20 mmol/L — ABNORMAL LOW (ref 22–32)
Calcium: 8.8 mg/dL — ABNORMAL LOW (ref 8.9–10.3)
Chloride: 84 mmol/L — ABNORMAL LOW (ref 98–111)
Creatinine, Ser: 3.96 mg/dL — ABNORMAL HIGH (ref 0.61–1.24)
GFR, Estimated: 16 mL/min — ABNORMAL LOW (ref >60)
Glucose, Bld: 183 mg/dL — ABNORMAL HIGH (ref 70–99)
Phosphorus: 3.5 mg/dL (ref 2.5–4.6)
Potassium: 5.1 mmol/L (ref 3.5–5.1)
Sodium: 121 mmol/L — ABNORMAL LOW (ref 135–145)

## 2023-12-29 LAB — LACTIC ACID, PLASMA
Lactic Acid, Venous: 2.7 mmol/L (ref 0.5–1.9)
Lactic Acid, Venous: 3.5 mmol/L (ref 0.5–1.9)
Lactic Acid, Venous: 6.6 mmol/L (ref 0.5–1.9)
Lactic Acid, Venous: 1.7 mmol/L (ref 0.5–1.9)

## 2023-12-29 LAB — CK TOTAL AND CKMB (NOT AT ARMC)
CK, MB: 27.4 ng/mL — ABNORMAL HIGH (ref 0.5–5.0)
Total CK: 257 U/L (ref 49–397)

## 2023-12-29 LAB — MRSA NEXT GEN BY PCR, NASAL: MRSA by PCR Next Gen: NOT DETECTED

## 2023-12-29 LAB — OSMOLALITY: Osmolality: 310 mosm/kg — ABNORMAL HIGH (ref 275–295)

## 2023-12-29 LAB — RESP PANEL BY RT-PCR (RSV, FLU A&B, COVID)  RVPGX2
Influenza A by PCR: NEGATIVE
Influenza B by PCR: NEGATIVE
Resp Syncytial Virus by PCR: NEGATIVE
SARS Coronavirus 2 by RT PCR: NEGATIVE

## 2023-12-29 LAB — PREPARE RBC (CROSSMATCH)

## 2023-12-29 LAB — PROCALCITONIN: Procalcitonin: 0.86 ng/mL

## 2023-12-29 MED ORDER — DOBUTAMINE-DEXTROSE 4-5 MG/ML-% IV SOLN: 2.5000 ug/kg/min | INTRAVENOUS | Status: DC

## 2023-12-29 MED ORDER — EPINEPHRINE HCL 5 MG/250ML IV SOLN IN NS: 0.5000 ug/min | INTRAVENOUS | Status: DC

## 2023-12-29 MED ORDER — NOREPINEPHRINE 16 MG/250ML-% IV SOLN
0.0000 ug/min | INTRAVENOUS | Status: DC
  Administered 2023-12-29: 8 ug/min via INTRAVENOUS
  Administered 2023-12-31: 1 ug/min via INTRAVENOUS
  Filled 2023-12-29 (×2): qty 250

## 2023-12-29 MED ORDER — SODIUM ZIRCONIUM CYCLOSILICATE 10 G PO PACK
10.0000 g | PACK | Freq: Once | ORAL | Status: DC
  Filled 2023-12-29: qty 1

## 2023-12-29 MED ORDER — LINEZOLID 600 MG/300ML IV SOLN
600.0000 mg | Freq: Two times a day (BID) | INTRAVENOUS | Status: AC
  Administered 2023-12-29 – 2023-12-31 (×6): 600 mg via INTRAVENOUS
  Filled 2023-12-29 (×8): qty 300

## 2023-12-29 MED ORDER — PIPERACILLIN-TAZOBACTAM IN DEX 2-0.25 GM/50ML IV SOLN
2.2500 g | Freq: Three times a day (TID) | INTRAVENOUS | Status: DC
  Administered 2023-12-29 – 2024-01-02 (×12): 2.25 g via INTRAVENOUS
  Filled 2023-12-29 (×17): qty 50

## 2023-12-29 MED ORDER — SODIUM CHLORIDE 0.9% IV SOLUTION: Freq: Once | INTRAVENOUS | Status: DC

## 2023-12-29 MED ORDER — SODIUM BICARBONATE 650 MG PO TABS: 1300.0000 mg | ORAL_TABLET | Freq: Two times a day (BID) | ORAL | Status: DC

## 2023-12-29 MED ORDER — INSULIN GLARGINE 100 UNIT/ML ~~LOC~~ SOLN
5.0000 [IU] | Freq: Every day | SUBCUTANEOUS | Status: DC
  Administered 2023-12-30 – 2024-01-02 (×4): 5 [IU] via SUBCUTANEOUS
  Filled 2023-12-29 (×5): qty 0.05

## 2023-12-29 MED ORDER — FUROSEMIDE 10 MG/ML IJ SOLN
80.0000 mg | Freq: Once | INTRAMUSCULAR | Status: AC
  Administered 2023-12-29: 80 mg via INTRAVENOUS
  Filled 2023-12-29: qty 8

## 2023-12-29 MED ORDER — DOBUTAMINE-DEXTROSE 4-5 MG/ML-% IV SOLN
INTRAVENOUS | Status: AC
  Administered 2023-12-29: 2.5 ug/kg/min via INTRAVENOUS
  Filled 2023-12-29: qty 250

## 2023-12-29 MED ORDER — IOHEXOL 350 MG/ML SOLN
75.0000 mL | Freq: Once | INTRAVENOUS | Status: AC | PRN
  Administered 2023-12-29: 75 mL via INTRAVENOUS

## 2023-12-29 MED ORDER — PIPERACILLIN-TAZOBACTAM 3.375 G IVPB: 3.3750 g | Freq: Three times a day (TID) | INTRAVENOUS | Status: DC

## 2023-12-29 MED ORDER — SODIUM CHLORIDE 0.9 % IV SOLN: INTRAVENOUS | Status: AC | PRN

## 2023-12-29 MED ORDER — LIDOCAINE HCL URETHRAL/MUCOSAL 2 % EX GEL
1.0000 | Freq: Once | CUTANEOUS | Status: DC
  Filled 2023-12-29: qty 6

## 2023-12-29 MED ORDER — MUPIROCIN 2 % EX OINT
TOPICAL_OINTMENT | Freq: Two times a day (BID) | CUTANEOUS | Status: DC
  Administered 2024-01-04 – 2024-01-06 (×2): 1 via NASAL
  Filled 2023-12-29 (×6): qty 22

## 2023-12-29 MED ORDER — SODIUM CHLORIDE 0.9 % IV BOLUS
1000.0000 mL | Freq: Once | INTRAVENOUS | Status: AC
  Administered 2023-12-29: 1000 mL via INTRAVENOUS

## 2023-12-29 MED ORDER — INSULIN ASPART 100 UNIT/ML IJ SOLN
0.0000 [IU] | INTRAMUSCULAR | Status: DC
  Administered 2023-12-29 – 2023-12-30 (×4): 2 [IU] via SUBCUTANEOUS
  Administered 2023-12-30 (×3): 1 [IU] via SUBCUTANEOUS
  Administered 2023-12-31 – 2024-01-01 (×5): 2 [IU] via SUBCUTANEOUS
  Administered 2024-01-01: 1 [IU] via SUBCUTANEOUS

## 2023-12-29 MED ORDER — NOREPINEPHRINE 4 MG/250ML-% IV SOLN
0.0000 ug/min | INTRAVENOUS | Status: DC
  Administered 2023-12-29: 2 ug/min via INTRAVENOUS
  Filled 2023-12-29: qty 250

## 2023-12-29 MED ORDER — LIDOCAINE 4 % EX CREA
TOPICAL_CREAM | Freq: Once | CUTANEOUS | Status: AC
  Administered 2023-12-29: 1 via TOPICAL
  Filled 2023-12-29: qty 5

## 2023-12-29 NOTE — Progress Notes (Signed)
 This RN assume care of this patient this a.m. Upon assessment of patient @07 :30. Vitals taken, Patient has a temp of 100.4. Patient denies pain but C/O feeling restless. BP=106/69 NA=121, HR=99.Dr. JOHNITA So made aware, MD saw patient in room, examined patient.  Orders received and followed. Tynelol 650 mg given, NS IV bolus 1000ml given, lab specimen and blood cultures drawn by lab Tech. Foley Cath changed by Certified RN's. Urine specimen sent to lab, and respiratory panel done. Patient is having loose stool. Will continue to monitor.

## 2023-12-29 NOTE — Progress Notes (Signed)
 Patient was transferred to the ICU for mixed picture of septic and cardiogenic shock. Working diagnosis is source being from urinary tract given onset of hematuria. 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

## 2023-12-29 NOTE — Progress Notes (Signed)
 Pharmacy Antibiotic Note  Derek Thompson is a 63 y.o. male admitted on 11/02/2023 with sepsis.  Pharmacy has been consulted for Zosyn  and Vancomycin  dosing. Discussed with CCM team - changing Vancomycin  to Linezolid  in setting of AKI. Holding Remeron  for drug interaction while on Linezolid .   SCr trending up 3.96/CrCl ~16.7 ml/min Lactic acid trend up 3.5 WBC up 18 Tm 100.4  Plan: After discussion with Jeralyn Banner, NP CCM, Vancomycin  changed to Linezolid  600 IV q12.  Zosyn  2.25g IV every 8 hours.  Pharmacy will sign of dosing consults and dose per CCM renal dosing protocol   Height: 6' (182.9 cm) Weight: 62.7 kg (138 lb 3.7 oz) IBW/kg (Calculated) : 77.6  Temp (24hrs), Avg:98.7 F (37.1 C), Min:98.1 F (36.7 C), Max:100.4 F (38 C)  Recent Labs  Lab 12/23/23 0548 12/25/23 1101 12/26/23 0518 12/27/23 0439 12/28/23 0531 12/29/23 0422 12/29/23 0926 12/29/23 1033  WBC 6.6 5.5  --   --  5.7  --  18.0*  --   CREATININE 3.20* 3.57* 3.80* 3.90* 3.48* 3.96*  --   --   LATICACIDVEN  --   --   --   --   --   --  2.7* 3.5*    Estimated Creatinine Clearance: 16.7 mL/min (A) (by C-G formula based on SCr of 3.96 mg/dL (H)).    Allergies  Allergen Reactions   Cefepime  Other (See Comments)    Cefepime  neurotoxicity 11/07/23    Antimicrobials this admission: 7/4 CTX>>7/8 LZD 7/5 >>7/11 CFP 7/5 >>7/8 Azith 7/7 >> 7/8 Augmentin  7/8>> 7/19 Doxycycline  7/11>>7/19 CRO 7/22 >>7/28 Vancomycin  8/29 >> Zosyn  8/29 >>  Dose adjustments this admission:  Microbiology results: 7/4 ucx- unremarkableF 7/4 bcx ngtdF 7/7 4-plex PCR not detected  7/7 MRSA PCR pos 7/7 resp panel unremarkable 7/22 blood>>ngF 7/22 urine>>ngF 7/27 blood>>ngtd4 8/29 blood >>  Thank you for allowing pharmacy to be a part of this patient's care.  Harlene Boga, PharmD, BCPS, BCCCP Clinical Pharmacist Please refer to [facility] for [facility] numbers 12/29/2023 11:48 AM

## 2023-12-29 NOTE — Consult Note (Addendum)
NAME:  Derek Thompson, MRN:  [id number], DOB:  1961/01/12, LOS: 56 ADMISSION DATE:  11/02/2023, CONSULTATION DATE:  829 REFERRING MD:  Shawn, CHIEF COMPLAINT:  shock   History of Present Illness:  63 year old male resided at homeless shelter prior to admission presented initially back on 7/4 with acute mental status change, lethargy, hyponatremia, AKI, and hyperkalemia.  Was tachycardic on initial evaluation, and treated volume resuscitation which resulted in improved sodium up to 126, he had significant urinary retention and a Foley catheter was placed, and he was empirically started on antibiotics for presumed urinary tract infection .  His hospital course has been prolonged, clinically had improved over the first 2 weeks of therapy.  Orthopedic surgery was consulted, who also recommended BKA of the right extremity.  Early in the hospital admission was noted to have a fairly significant protein gap with a serum protein level of 7.6 and an albumin of 2.6 triggering further investigation yielding abnormal proteins SPEP with gammaglobulin 2.1 and M spike of 1.1 for which oncology was consulted.  Ultimately underwent bone marrow biopsy with findings consistent with multiple myeloma, plan was to follow-up with oncology.  He underwent BKA on 7/18. Overall it appears as though his hospital course had been improving and he had actually been cleared for hospital discharge in early August, he had been working with hospital physical therapy, and enrolled in the [facility] program. On 8/25 chart review reports patient reporting some nausea,  Still working with physical therapy.  Last Foley catheter changed on 8/8 with coud 8/26 poor appetite 8/27 function a little worse, baseline felt to be somewhere in the range of 2-3 rising up to 3.9 with BUN up to 109 Farxiga  was stopped got some Lokelma  for hyperkalemia.  Clinically appeared volume depleted encouraged to increase oral intake, and administered crystalloid.   Renal function improved a little bit down to 3.48 8/28 held off on further hydration.  The hyponatremia and renal dysfunction was also felt to be at least in part due to the amount of clonal gammopathy with pseudohyponatremia. On 8/29 patient's sodium noted to be down to 121, at that time his heart rate was 121 blood pressure 99/66 however bedside evaluation found him to be awake oriented without any focal deficits no headaches or dizziness.  Later in the morning around 7:30 AM the patient was found confused and febrile.  He was administered Tylenol , 1 L of saline was administered, blood cultures sent, and started on broad-spectrum antibiotics.  Later that morning around 1036 heart rate up in the 140s, new hematuria (this has been a intermittent problem during the hospitalization) patient more confused, blood pressure in the 60s systolically still receiving the crystalloid previously ordered stat echocardiogram obtained, rapid response called, critical care asked to evaluate at bedside.  Stat echocardiogram result showed LVEF less than 20% with a large amount of soft thrombus/slow flow at the LV apex there is moderate RV systolic dysfunction with concern for cardiogenic shock.  He was moved to the intensive care, initially there was plan for CT abdomen pelvis to rule out possible sepsis source superimposed on cardiogenic shock in the context of his delirium, fever, and hematuria however there was insufficient IV access to obtain that.  His care was assumed by critical care services as of 8/29 with a working diagnosis of undifferentiated shock, acute on chronic renal failure, acute on chronic hyponatremia, and acute metabolic encephalopathy.   Pertinent  Medical History  Heart failure with reduced EF, baseline in the  20 range, urinary retention secondary to history of prostate cancer with chronic Foley, chronic right lower extremity osteomyelitis, CKD stage IIIb.  Significant Hospital Events: Including  procedures, antibiotic start and stop dates in addition to other pertinent events   Admitted 7/4 Bone marrow biopsy 7/17 7/18 right BKA 7/27 through 7/28 rising serum creatinine and BUN 7/29 new fever, hematuria, acute mental status change, acute hyponatremia.  New shock.  Required norepinephrine . Stat echocardiogram result showed LVEF less than 20% with a large amount of soft thrombus/slow flow at the LV apex there is moderate RV systolic dysfunction with concern for cardiogenic shock.  Central line placed, Co. oximetry obtained with SpO2 in the 40s continued on norepinephrine , checking stat CBC needs to be above 7, if CBC dropping further may need to consider transfusion  Interim History / Subjective:  No distress  Objective    Blood pressure 92/68, pulse (!) 118, temperature 98.1 F (36.7 C), resp. rate 20, height 6' (1.829 m), weight 62.7 kg, SpO2 96%.        Intake/Output Summary (Last 24 hours) at 12/29/2023 1140 Last data filed at 12/29/2023 1116 Gross per 24 hour  Intake 2342.13 ml  Output 1075 ml  Net 1267.13 ml   Filed Weights   12/11/23 0500 12/14/23 0500 12/19/23 0500  Weight: 62.1 kg 61.7 kg 62.7 kg    Examination: General: 63 year old male patient lying in bed no acute distress currently, however is requiring norepinephrine .  Intermittently impulsive HENT: Mucous membranes are moist, does have some mild neck vein distention Lungs: Clear diminished bases no accessory use Cardiovascular: Holosystolic heart murmur, currently sinus tachycardic Point-of-care echocardiogram LV with severe dysfunction.  I do not appreciate any significant respiratory variation with IVC Abdomen: Soft not tender Extremities: The right BKA stump is clean dry and intact there is no drainage or warmth.  Extremity check notable for cool left foot however warm down to ankle level pulses weak Neuro: Awake, oriented x 3 but affect flat intermittently confused and impulsive moves all extremities with  equal strength GU: Dark bloody urine output no Foley catheter just changed  Resolved problem list   Assessment and Plan   Cardiogenic and septic shock with lactic acidosis Has known history of heart failure with reduced EF, typically in the 20-25 range now down to about 13%.  Suspect source of sepsis is his chronic Foley especially with onset of new hematuria, but now biventricular heart failure a significant contributing factor Plan Stopping beta-blockade Will KVO IV fluids now, I do not think he needs further volume resuscitation Central venous catheter has been placed we will check CVP and Co. oximetry Continue to titrate norepinephrine  for mean arterial pressure greater than 65  Assuming he is euvolemic if Co. oximetry less than 70 we will add 0.25 mics per minute dobutamine  Follow serial CVP's and Co. oximetry's Will likely need diuretics, will wait initial CVP and ensure we can maintain his mean arterial pressure Cultures have been sent, will follow Broad-spectrum antibiotics (zyvox  and zosyn )  Trend BNP and PCTs as well as lactic acid Will ask Adv HF team to see Have ordered CT abdomen pelvis looking for potential infectious source Not candidate for GDMT currently Holding asa Cont statin  Known LV thrombus Was on Eliquis  Plan Holding Eliquis  for now given the marked hematuria Will start IV heparin  without bolus as urine output starts to clear a little more  Acute on chronic renal failure.  Baseline creatinine felt to be in the mid 2-3 range, with  CKD stage IIIb at baseline History of urinary retention and prostate cancer with chronic Foley catheter. His underlying CKD felt to be due to monoclonal gammopathy  Plan Keep euvolemic Ensure mean arterial pressure greater than 65 Renal dose medications Foley catheter just replaced on 8/29 Strict intake output Follow-up CT abdomen and pelvis, he has a history of bilateral hydronephrosis.  If things look worse in regards to  this may necessitate urology consultation sooner  Hematuria.  Apparently this has been on and off through the hospitalization.  Noted once again earlier this morning, a little worse after coud catheter replaced on 8/29 Plan Holding anticoagulation Strict intake output IV heparin  instead of Eliquis  once we resume anticoagulation Follow-up CBC If not clearing in the next 24 hours he is going to need urology consult  Acute metabolic encephalopathy.  Suspect multifactorial secondary to a) Sepsis B) uremia c) possibly symptomatic hyponatremia also consider postictal state Rapid response nurse did report some abnormal motor activity when she first arrived  Plan Supportive care Hold any sedating medications Follow-up sodium, if worse following volume resuscitation I think we are compelled to administer at least 1 dose of hypertonic saline Avoid sedating medications Will get a spot EEG  Acute on chronic hyponatremia. We had been pushing fluid intake over the last 48 hours.  Sudden drop of sodium following these measures.  He does not appear volume depleted at this point if anything potentially volume up Plan Checking central venous pressure Ensure adequate cardiac output Check cortisol Check serum osmolality Check urine osmolality Follow-up stat sodium, if sodium has fallen after having received close to a liter of saline I think we will be compelled to administer at least 1 bolus of hypertonic saline given his new onset of delirium Serial chemistries  Hyperkalemia Plan Follow-up chemistry  Chronic anemia Looks like baseline hanging around 8 With new onset of hematuria and Co. 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  Completed on 7/18.  Currently receiving physical therapy for significant deconditioning Plan Continue PT OT as tolerated  Elevated protein gap with monoclonal gammopathy and suspected multiple myeloma from biopsy on 7/17 Plan Supportive care Follow-up with heme/Ong.  CODE STATUS Plan Full code   Labs   CBC: Recent Labs  Lab 12/23/23 0548 12/25/23 1101 12/28/23 0531 12/29/23 0926  WBC 6.6 5.5 5.7 18.0*  NEUTROABS  --   --   --  16.7*  HGB 7.8* 8.0* 8.2* 8.0*  HCT 24.5* 25.6* 25.4* 24.5*  MCV 85.7 86.2 84.9 84.2  PLT 344 330 329 292    Basic Metabolic Panel: Recent Labs  Lab 12/25/23 1101 12/26/23 0518 12/27/23 0439 12/28/23 0531 12/29/23 0422  NA 126* 127* 126* 130* 121*  K 5.2* 5.8* 5.3* 4.8 5.1  CL 91* 91* 87* 91* 84*  CO2 27 28 26 27  20*  GLUCOSE 224* 120* 108* 102* 183*  BUN 88* 91* 109* 116* 126*  CREATININE 3.57* 3.80* 3.90* 3.48* 3.96*  CALCIUM  9.0 8.9 8.9 9.2 8.8*  PHOS 4.3 4.9* 4.4 4.7* 3.5   GFR: Estimated Creatinine Clearance: 16.7 mL/min (A) (by C-G formula based on SCr of 3.96 mg/dL (H)). Recent Labs  Lab 12/23/23 [phone] 12/25/23 1101 12/28/23 0531 12/29/23 0926 12/29/23  1033  WBC 6.6 5.5 5.7 18.0*  --   LATICACIDVEN  --   --   --  2.7* 3.5*    Liver Function Tests: Recent Labs  Lab 12/26/23 0518 12/27/23 0439 12/28/23 0531 12/29/23 0422 12/29/23 0926  AST  --   --   --   --  28  ALT  --   --   --   --  23  ALKPHOS  --   --   --   --  51  BILITOT  --   --   --   --  0.8  PROT  --   --   --   --  8.3*  ALBUMIN 1.9* 2.1* 2.2* 2.3* 2.3*   No results for input(s): LIPASE, AMYLASE in the last 168 hours. No results for input(s): AMMONIA in the last 168 hours.  ABG    Component Value Date/Time   PHART 7.392 10/14/2022 0804   PCO2ART 33.8 10/14/2022 0804   PO2ART 96 10/14/2022 0804   HCO3 21.1 06/13/2023 1958   TCO2 20 (L) 06/13/2023 1959   ACIDBASEDEF 2.0 06/13/2023 1958    O2SAT 66.2 06/14/2023 1250     Coagulation Profile: No results for input(s): INR, PROTIME in the last 168 hours.  Cardiac Enzymes: No results for input(s): CKTOTAL, CKMB, CKMBINDEX, TROPONINI in the last 168 hours.  HbA1C: Hgb A1c MFr Bld  Date/Time Value Ref Range Status  09/02/2023 06:15 AM 8.7 (H) 4.8 - 5.6 % Final    Comment:    (NOTE) Pre diabetes:          5.7%-6.4%  Diabetes:              >6.4%  Glycemic control for   <7.0% adults with diabetes   06/13/2023 07:40 PM 13.3 (H) 4.8 - 5.6 % Final    Comment:    (NOTE) Pre diabetes:          5.7%-6.4%  Diabetes:              >6.4%  Glycemic control for   <7.0% adults with diabetes     CBG: Recent Labs  Lab 12/28/23 0930 12/28/23 1144 12/28/23 1617 12/28/23 2113 12/29/23 0811  GLUCAP 145* 175* 242* 124* 219*    Review of Systems:   Not able   Past Medical History:  He,  has a past medical history of Anemia, Diabetes mellitus without complication (HCC) (1987), Family history of breast cancer, Hypertension, Myocardial infarction (HCC), and Prostate cancer (HCC).   Surgical History:   Past Surgical History:  Procedure Laterality Date   ABDOMINAL AORTOGRAM W/LOWER EXTREMITY N/A 06/16/2023   Procedure: ABDOMINAL AORTOGRAM W/LOWER EXTREMITY;  Surgeon: Magda Debby SAILOR, MD;  Location: [facility];  Service: Cardiovascular;  Laterality: N/A;   AMPUTATION Right 06/21/2023   Procedure: RIGHT 2ND RAY AMPUTATION;  Surgeon: Harden Jerona GAILS, MD;  Location: [facility] OR;  Service: Orthopedics;  Laterality: Right;   AMPUTATION Right 11/17/2023   Procedure: AMPUTATION BELOW KNEE;  Surgeon: Harden Jerona GAILS, MD;  Location: [facility] OR;  Service: Orthopedics;  Laterality: Right;   APPLICATION OF WOUND VAC Right 11/17/2023   Procedure: APPLICATION, WOUND VAC;  Surgeon: Harden Jerona GAILS, MD;  Location: [facility] OR;  Service: Orthopedics;  Laterality: Right;   DENTAL SURGERY     IR BONE MARROW BIOPSY & ASPIRATION  11/16/2023    RADIOACTIVE SEED IMPLANT N/A 02/04/2021   Procedure: RADIOACTIVE SEED IMPLANT/BRACHYTHERAPY IMPLANT;  Surgeon: Selma Donnice SAUNDERS, MD;  Location:  [facility] ORS;  Service: Urology;  Laterality: N/A;   RIGHT/LEFT HEART CATH AND CORONARY ANGIOGRAPHY N/A 05/25/2020   Procedure: RIGHT/LEFT HEART CATH AND CORONARY ANGIOGRAPHY;  Surgeon: [place], Jonnae Fonseca M, MD;  Location: [facility] INVASIVE CV LAB;  Service: Cardiovascular;  Laterality: N/A;   RIGHT/LEFT HEART CATH AND CORONARY ANGIOGRAPHY N/A 10/14/2022   Procedure: RIGHT/LEFT HEART CATH AND CORONARY ANGIOGRAPHY;  Surgeon: Cherrie Toribio SAUNDERS, MD;  Location: [facility] CV [facility];  Service: Cardiovascular;  Laterality: N/A;   SPACE OAR INSTILLATION N/A 02/04/2021   Procedure: SPACE OAR INSTILLATION;  Surgeon: Selma Donnice SAUNDERS, MD;  Location: [facility] ORS;  Service: Urology;  Laterality: N/A;     Social History:   reports that he quit smoking about 6 years ago. His smoking use included cigarettes. He started smoking about 49 years ago. He has a 43 pack-year smoking history. He has never used smokeless tobacco. He reports that he does not currently use alcohol. He reports that he does not currently use drugs.   Family History:  His family history includes Breast cancer in his mother; Cancer in his maternal grandfather.   Allergies Allergies  Allergen Reactions   Cefepime  Other (See Comments)    Cefepime  neurotoxicity 11/07/23     Home Medications  Prior to Admission medications   Medication Sig Start Date End Date Taking? Authorizing Provider  apixaban  (ELIQUIS ) 5 MG TABS tablet Take 1 tablet (5 mg total) by mouth 2 (two) times daily. Patient taking differently: Take 5 mg by mouth daily. 10/16/23 01/25/2024 Yes Bender, Damien, DO  aspirin  EC 81 MG tablet Take 1 tablet (81 mg total) by mouth daily. Swallow whole. 10/16/23  Yes Bender, Damien, DO  atorvastatin  (LIPITOR ) 80 MG tablet Take 1 tablet (80 mg total) by mouth daily. 10/16/23 01/29/2024 Yes Bender, Damien, DO  dapagliflozin  propanediol  (FARXIGA ) 10 MG TABS tablet Take 1 tablet (10 mg total) by mouth daily before breakfast. 10/16/23  Yes Bender, Damien, DO  ezetimibe  (ZETIA ) 10 MG tablet Take 1 tablet (10 mg total) by mouth daily. 10/16/23  Yes Bender, Damien, DO  ferrous sulfate  325 (65 FE) MG tablet Take 1 tablet (325 mg total) by mouth daily. 10/16/23  Yes Kandis Damien, DO  furosemide  (LASIX ) 20 MG tablet Take 1 tablet (20 mg total) by mouth daily as needed for fluid or edema. Please take on tablet if your have shortness of breath, leg swelling, gain more than 3 pounds within one day or gain more than 5 pounds within one week. 10/16/23 10/15/24 Yes Bender, Damien, DO  metFORMIN  (GLUCOPHAGE ) 500 MG tablet Take 2 tablets (1,000 mg total) by mouth 2 (two) times daily with a meal. Patient taking differently: Take 1,000 mg by mouth daily. 09/11/23 11/02/23 Yes Tobie Gaines, DO  metoprolol  succinate (TOPROL -XL) 25 MG 24 hr tablet TAKE 1 TABLET BY MOUTH DAILY 10/16/23  Yes Kandis Damien, DO  Multiple Vitamin (MULTIVITAMIN) tablet Take 1 tablet by mouth daily.   Yes [provider]  spironolactone  (ALDACTONE ) 25 MG tablet Take 1 tablet (25 mg total) by mouth daily. 10/16/23 10/15/24 Yes Bender, Damien, DO  tamsulosin  (FLOMAX ) 0.4 MG CAPS capsule Take 1 capsule (0.4 mg total) by mouth daily after supper. 10/16/23  Yes Kandis Damien, DO     Critical care time: 80 minutes

## 2023-12-29 NOTE — Progress Notes (Signed)
 RT X3 were unable to obtain Arterial Line. Pt is refusing 3rd attempt at this time.

## 2023-12-29 NOTE — Consult Note (Addendum)
Advanced Heart Failure Team Consult Note   Primary Physician: Elicia Sharper, DO Cardiologist:  Vinie JAYSON Maxcy, MD AHF: Dr. Bensimhon   Reason for Consultation: acute on chronic systolic heart failure w/ cardiogenic shock   HPI:    Derek Thompson is seen today for evaluation of acute on chronic systolic heart failure w/ cardiogenic shock at the request of Dr. Annella, PCCM.    63 y/o AAM w/ h/o chronic systolic heart failure, felt primarily due to NICM, CAD, Prostate Cancer treated w/ radiation in 2022, urinary incontinence, HTN, Type 2DM and h/o poor compliance, citing previous inability to afford meds.    Echo 1/22 EF 25-30%, RV normal. Subsequent cath showed single vessel obstructive CAD involving a ramus intermediate branch that was 80% stenosed, 35% ostial-mid LAD, 50% distal LAD, 60% prox-mid RCA, 1st diag, 50% ost-prox LCx 50% distal RCA, 30%. LV dysfunction was out of proportion to the degree of CAD. Medical management of CAD was recommended. RHC demonstrated normal filling pressures and preserved output (mRA 2, PAP 16/4, mPCW 5, CO 5.42, CI 2.79).  Repeat echo 10/22, EF improved to 45-50%, RV normal.    Also w/ carotid artery stenosis. Dopplers followed annually by Dr. Court. Most recent study 3/24 showed 40-59% Rt ICA stenosis, 1-30% Lt ICA stenosis.    Admitted 4/24 for a/c CHF. Reported being off all HF meds x 4 months, citing inability to afford refills. He was hypertensive on admission. Echo showed EF back down again, 25%, RV mildly reduced, mild MR. He was diuresed w/ IV lasix  and restarted on GDMT. Labs were also c/w IDA. Tsat 12%, Ferritin 94, advised to start PO iron  at d/c.    Seen in [facility] and concern that persistent LV dysfunction may be due to progressive CAD. Set up for repeat R/L cath. R/L cath 10/14/22 Showed stable 3v non-obstructive CAD. There is 70% lesion in proximal to mid Lcx which does not appear flow-limiting. LVEF appears reduced out of  proportion to CAD. RHC demonstrated well compensated hemodynamics (RA 1, PA 30/7, PCW 15, CO 5.9, PA sat 68%).   Echo 6/25 EF 20-25%, w/ large LV thrombus, GIIDD, RV mildly reduced.   cMRI 6/25 showed severe systolic dysfunction. Global hypokinesis, with septal dyskinesis consistent with LBBB, Elevated ECV (40%) and diffusely elevated T2 values, Basal septal midwall LGE, RV insertion site LGE, Subendocardial LGE in apical inferolateral wall, LV EF: 24% (Normal 49-79%), RVEF 35%, + Large LV thrombus seen on prior echo 06/2023 appears resolved. _________________________________  AHF team asked to see today for cardiogenic shock. Pt has had a prolonged hospitalization. This his hospital day 92. He was admitted on 11/02/23. Had been living in a homeless shelter. He initially presented w/ increased lethargy and fatigue. He was noted to have urinary retention and UTI treated w/ abx. 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 Both his initial Blood Cx and UCx were all negative. Also diagnosed w/ Multiple myeloma. He was noted to have a fairly significant protein gap prompting further investigation. Found to have abnormal SPEP and + M spike protein. Underwent subsequent bone marrow biopsy which confirmed multiple myeloma. Plan was made to have outpatient f/u w/ oncology once discharged.   Per notes, yesterday pt was felt clinically improved and medically stable for d/c to SNF. Was awaiting bed placement. However, earlier today, pt noted to be tachycardic and hypotensive. W/ new leukocytosis, WBC increased from 5.7>>18K . SCr also worse from  3.48>>3.96. LA 2.7 >>3.5.   He required initiation of NE and transferred to the MICU. He is being treated for suspected sepsis. Now on linezolid  and Zosyn . BCx pending.   Also felt to have CS. Co-ox 46%. Echo done today shows LV EF severely reduced < 20%, RV mod reduced. There is heavy smoke noted in the LV apex suggestive of soft thrombus  and very low flow.   He has been started on DBA, currently at 2.5.   He is not on heparin . He has foley w/ evidence of hematuria. Early AM CBC Hgb was 8.0. Hgb on Co-ox panel was 6.9. Repeat CBC in progress.   Also getting EEG due to questionable seizure like activity when rapid response assessed earlier + acute hyponatremia w/ drop in Na from 130 day prior to 121 today.   On my assessment, pt is alert and oriented. He is shivering w/ rigors. Denies dyspnea. No CP.  CVP 14. On NE 8 + DBA 2.5. Cuff MAP 69. No current a-line.      Echo 4/24 1. Left ventricular ejection fraction, by estimation, is 25%. The left  ventricle has severely decreased function. The left ventricle demonstrates  global hypokinesis. Left ventricular diastolic parameters are consistent  with Grade II diastolic  dysfunction (pseudonormalization).   2. Right ventricular systolic function is mildly reduced. The right  ventricular size is normal. Tricuspid regurgitation signal is inadequate  for assessing PA pressure.   3. Left atrial size was mildly dilated.   4. The mitral valve is normal in structure. Mild mitral valve  regurgitation. No evidence of mitral stenosis.   5. The aortic valve is tricuspid. Aortic valve regurgitation is not  visualized. No aortic stenosis is present.   6. The inferior vena cava is normal in size with greater than 50%  respiratory variability, suggesting right atrial pressure of 3 mmHg.     Echo Today    1. Heavy smoke noted in the LV apex which is suggestive of soft thrombus  and very low flow (image 52). Paired LV mid-ventricular false tendons  (normal variant). Left ventricular ejection fraction, by estimation, is  <20%. Left ventricular ejection  fraction by PLAX is 13 %. The left ventricle has severely decreased  function. The left ventricle demonstrates global hypokinesis. Left  ventricular diastolic parameters are indeterminate.   2. Right ventricular systolic function is  moderately reduced. The right  ventricular size is normal. There is normal pulmonary artery systolic  pressure. The estimated right ventricular systolic pressure is 29.0 mmHg.   3. Left atrial size was moderately dilated.   4. The mitral valve is grossly normal. Mild mitral valve regurgitation.   5. The aortic valve is tricuspid. Aortic valve regurgitation is not  visualized. No aortic stenosis is present.   Comparison(s): Changes from prior study are noted. 10/12/2023: LVEF 20-25%,  no LV apical thrombus.    Home Medications Prior to Admission medications   Medication Sig Start Date End Date Taking? Authorizing Provider  apixaban  (ELIQUIS ) 5 MG TABS tablet Take 1 tablet (5 mg total) by mouth 2 (two) times daily. Patient taking differently: Take 5 mg by mouth daily. 10/16/23 01/01/2024 Yes Bender, Damien, DO  aspirin  EC 81 MG tablet Take 1 tablet (81 mg total) by mouth daily. Swallow whole. 10/16/23  Yes Kandis Damien, DO  atorvastatin  (LIPITOR ) 80 MG tablet Take 1 tablet (80 mg total) by mouth daily. 10/16/23 01/26/2024 Yes Bender, Damien, DO  dapagliflozin  propanediol (FARXIGA ) 10 MG TABS tablet Take  1 tablet (10 mg total) by mouth daily before breakfast. 10/16/23  Yes Bender, Damien, DO  ezetimibe  (ZETIA ) 10 MG tablet Take 1 tablet (10 mg total) by mouth daily. 10/16/23  Yes Kandis Damien, DO  ferrous sulfate  325 (65 FE) MG tablet Take 1 tablet (325 mg total) by mouth daily. 10/16/23  Yes Kandis Damien, DO  furosemide  (LASIX ) 20 MG tablet Take 1 tablet (20 mg total) by mouth daily as needed for fluid or edema. Please take on tablet if your have shortness of breath, leg swelling, gain more than 3 pounds within one day or gain more than 5 pounds within one week. 10/16/23 10/15/24 Yes Bender, Damien, DO  metFORMIN  (GLUCOPHAGE ) 500 MG tablet Take 2 tablets (1,000 mg total) by mouth 2 (two) times daily with a meal. Patient taking differently: Take 1,000 mg by mouth daily. 09/11/23 11/02/23 Yes Tobie Gaines, DO   metoprolol  succinate (TOPROL -XL) 25 MG 24 hr tablet TAKE 1 TABLET BY MOUTH DAILY 10/16/23  Yes Kandis Damien, DO  Multiple Vitamin (MULTIVITAMIN) tablet Take 1 tablet by mouth daily.   Yes [provider]  spironolactone  (ALDACTONE ) 25 MG tablet Take 1 tablet (25 mg total) by mouth daily. 10/16/23 10/15/24 Yes Bender, Damien, DO  tamsulosin  (FLOMAX ) 0.4 MG CAPS capsule Take 1 capsule (0.4 mg total) by mouth daily after supper. 10/16/23  Yes Kandis Damien, DO    Past Medical History: Past Medical History:  Diagnosis Date   Anemia    Diabetes mellitus without complication (HCC) 1987   Family history of breast cancer    Hypertension    Myocardial infarction [facility])    Prostate cancer [facility])     Past Surgical History: Past Surgical History:  Procedure Laterality Date   ABDOMINAL AORTOGRAM W/LOWER EXTREMITY N/A 06/16/2023   Procedure: ABDOMINAL AORTOGRAM W/LOWER EXTREMITY;  Surgeon: Magda Debby SAILOR, MD;  Location: [facility] INVASIVE CV LAB;  Service: Cardiovascular;  Laterality: N/A;   AMPUTATION Right 06/21/2023   Procedure: RIGHT 2ND RAY AMPUTATION;  Surgeon: Harden Jerona GAILS, MD;  Location: [facility] OR;  Service: Orthopedics;  Laterality: Right;   AMPUTATION Right 11/17/2023   Procedure: AMPUTATION BELOW KNEE;  Surgeon: Harden Jerona GAILS, MD;  Location: [facility] OR;  Service: Orthopedics;  Laterality: Right;   APPLICATION OF WOUND VAC Right 11/17/2023   Procedure: APPLICATION, WOUND VAC;  Surgeon: Harden Jerona GAILS, MD;  Location: [facility] OR;  Service: Orthopedics;  Laterality: Right;   DENTAL SURGERY     IR BONE MARROW BIOPSY & ASPIRATION  11/16/2023   RADIOACTIVE SEED IMPLANT N/A 02/04/2021   Procedure: RADIOACTIVE SEED IMPLANT/BRACHYTHERAPY IMPLANT;  Surgeon: Selma Donnice SAUNDERS, MD;  Location: [facility] ORS;  Service: Urology;  Laterality: N/A;   RIGHT/LEFT HEART CATH AND CORONARY ANGIOGRAPHY N/A 05/25/2020   Procedure: RIGHT/LEFT HEART CATH AND CORONARY ANGIOGRAPHY;  Surgeon: Swaz[place], Peter M, MD;  Location: [facility] INVASIVE CV  LAB;  Service: Cardiovascular;  Laterality: N/A;   RIGHT/LEFT HEART CATH AND CORONARY ANGIOGRAPHY N/A 10/14/2022   Procedure: RIGHT/LEFT HEART CATH AND CORONARY ANGIOGRAPHY;  Surgeon: Cherrie Toribio SAUNDERS, MD;  Location: [facility] INVASIVE CV LAB;  Service: Cardiovascular;  Laterality: N/A;   SPACE OAR INSTILLATION N/A 02/04/2021   Procedure: SPACE OAR INSTILLATION;  Surgeon: Selma Donnice SAUNDERS, MD;  Location: [facility] ORS;  Service: Urology;  Laterality: N/A;    Family History: Family History  Problem Relation Age of Onset   Breast cancer Mother        dx 30s/40s, twice   Cancer Maternal Grandfather  unknown type, d. >50    Social History: Social History   Socioeconomic History   Marital status: Divorced    Spouse name: Not on file   Number of children: 3   Years of education: Not on file   Highest education level: Associate degree: academic program  Occupational History   Occupation: Training and development officer  Tobacco Use   Smoking status: Former    Current packs/day: 0.00    Average packs/day: 1 pack/day for 43.0 years (43.0 ttl pk-yrs)    Types: Cigarettes    Start date: 05/22/1974    Quit date: 05/22/2017    Years since quitting: 6.6   Smokeless tobacco: Never   Tobacco comments:    Quit 2019 about 1PPD  Vaping Use   Vaping status: Never Used  Substance and Sexual Activity   Alcohol use: Not Currently    Comment: last drink 2015   Drug use: Not Currently    Comment: none in 7 years   Sexual activity: Not Currently  Other Topics Concern   Not on file  Social History Narrative   3 sons   Social Drivers of Corporate investment banker Strain: Low Risk  (08/19/2022)   Overall Financial Resource Strain (CARDIA)    Difficulty of Paying Living Expenses: Not very hard  Food Insecurity: No Food Insecurity (11/02/2023)   Hunger Vital Sign    Worried About Running Out of Food in the Last Year: Never true    Ran Out of Food in the Last Year: Never true  Transportation Needs: Unmet Transportation  Needs (11/02/2023)   PRAPARE - Administrator, Civil Service (Medical): Yes    Lack of Transportation (Non-Medical): Yes  Physical Activity: Not on file  Stress: Not on file  Social Connections: Not on file    Allergies:  Allergies  Allergen Reactions   Cefepime  Other (See Comments)    Cefepime  neurotoxicity 11/07/23    Objective:    Vital Signs:   Temp:  [97.8 F (36.6 C)-100.4 F (38 C)] 97.8 F (36.6 C) (08/29 1215) Pulse Rate:  [86-143] 111 (08/29 1405) Resp:  [18-22] 22 (08/29 1405) BP: (65-109)/(43-69) 100/65 (08/29 1215) SpO2:  [96 %-100 %] 100 % (08/29 1405) Weight:  [40.1 kg] 40.1 kg (08/29 1215) Last BM Date : 12/29/23  Weight change: Filed Weights   12/14/23 0500 12/19/23 0500 12/29/23 1215  Weight: 61.7 kg 62.7 kg 40.1 kg    Intake/Output:   Intake/Output Summary (Last 24 hours) at 12/29/2023 1419 Last data filed at 12/29/2023 1200 Gross per 24 hour  Intake 1742.13 ml  Output 1275 ml  Net 467.13 ml      Physical Exam   GENERAL: chronically ill/ frail/ fatigued appearing male. Shivering w/ rigors. No respiratory difficulty  Lungs- diminished at the bases  CARDIAC:  JVP: 12 cm         Tachy rate, normal regular rhythm. No LEE ABDOMEN: Soft, non-tender, non-distended.  EXTREMITIES: s/p RBKA, stump healing well.  GU: +foley w/ gross hematuria   Telemetry   Sinus tach 110s   EKG    Sinus tach, LVH, 102 bpm   Labs   Basic Metabolic Panel: Recent Labs  Lab 12/25/23 1101 12/26/23 0518 12/27/23 0439 12/28/23 0531 12/29/23 0422  NA 126* 127* 126* 130* 121*  K 5.2* 5.8* 5.3* 4.8 5.1  CL 91* 91* 87* 91* 84*  CO2 27 28 26 27  20*  GLUCOSE 224* 120* 108* 102* 183*  BUN 88* 91* 109* 116*  126*  CREATININE 3.57* 3.80* 3.90* 3.48* 3.96*  CALCIUM  9.0 8.9 8.9 9.2 8.8*  PHOS 4.3 4.9* 4.4 4.7* 3.5    Liver Function Tests: Recent Labs  Lab 12/26/23 0518 12/27/23 0439 12/28/23 0531 12/29/23 0422 12/29/23 0926  AST  --   --   --    --  28  ALT  --   --   --   --  23  ALKPHOS  --   --   --   --  51  BILITOT  --   --   --   --  0.8  PROT  --   --   --   --  8.3*  ALBUMIN 1.9* 2.1* 2.2* 2.3* 2.3*   No results for input(s): LIPASE, AMYLASE in the last 168 hours. No results for input(s): AMMONIA in the last 168 hours.  CBC: Recent Labs  Lab 12/23/23 0548 12/25/23 1101 12/28/23 0531 12/29/23 0926  WBC 6.6 5.5 5.7 18.0*  NEUTROABS  --   --   --  16.7*  HGB 7.8* 8.0* 8.2* 8.0*  HCT 24.5* 25.6* 25.4* 24.5*  MCV 85.7 86.2 84.9 84.2  PLT 344 330 329 292    Cardiac Enzymes: No results for input(s): CKTOTAL, CKMB, CKMBINDEX, TROPONINI in the last 168 hours.  BNP: BNP (last 3 results) Recent Labs    06/29/23 0447 11/02/23 1552 11/03/23 0824  BNP 3,373.6* 4,136.5* 3,980.4*    ProBNP (last 3 results) No results for input(s): PROBNP in the last 8760 hours.   CBG: Recent Labs  Lab 12/28/23 1144 12/28/23 1617 12/28/23 2113 12/29/23 0811 12/29/23 1211  GLUCAP 175* 242* 124* 219* 153*    Coagulation Studies: No results for input(s): LABPROT, INR in the last 72 hours.   Imaging   DG Chest Port 1 View Result Date: 12/29/2023 CLINICAL DATA:  Central line placement. EXAM: PORTABLE CHEST 1 VIEW COMPARISON:  Radiograph of same day. FINDINGS: Interval placement of right internal jugular catheter is noted with distal tip in expected position of the SVC. No pneumothorax. IMPRESSION: Interval placement of right internal jugular catheter with distal tip in expected position of the SVC. Electronically Signed   By: Lynwood Landy Raddle M.D.   On: 12/29/2023 13:26   ECHOCARDIOGRAM COMPLETE Result Date: 12/29/2023    ECHOCARDIOGRAM REPORT   Patient Name:   Derek Thompson Date of Exam: 12/29/2023 Medical Rec #:  [id number]           Height:       72.0 in Accession #:    [id number]          Weight:       138.2 lb Date of Birth:  02/17/1961           BSA:          1.821 m Patient Age:    63 years             BP:           87/63 mmHg Patient Gender: M                   HR:           107 bpm. Exam Location:  Inpatient Procedure: 2D Echo (Both Spectral and Color Flow Doppler were utilized during            procedure). STAT ECHO Indications:    shock  History:        Patient has prior history of Echocardiogram  examinations, most                 recent 10/12/2023. History of LV thrombus, CAD; Risk                 Factors:Diabetes and Hypertension.  Sonographer:    Tinnie Barefoot RDCS Referring Phys: [id number] PHOEBE CHUN IMPRESSIONS  1. Heavy smoke noted in the LV apex which is suggestive of soft thrombus and very low flow (image 52). Paired LV mid-ventricular false tendons (normal variant). Left ventricular ejection fraction, by estimation, is <20%. Left ventricular ejection fraction by PLAX is 13 %. The left ventricle has severely decreased function. The left ventricle demonstrates global hypokinesis. Left ventricular diastolic parameters are indeterminate.  2. Right ventricular systolic function is moderately reduced. The right ventricular size is normal. There is normal pulmonary artery systolic pressure. The estimated right ventricular systolic pressure is 29.0 mmHg.  3. Left atrial size was moderately dilated.  4. The mitral valve is grossly normal. Mild mitral valve regurgitation.  5. The aortic valve is tricuspid. Aortic valve regurgitation is not visualized. No aortic stenosis is present. Comparison(s): Changes from prior study are noted. 10/12/2023: LVEF 20-25%, no LV apical thrombus. Conclusion(s)/Recommendation(s): Critical findings reported to Dr. Myrna and acknowledged at 12/29/2023 at 11:53 am. FINDINGS  Left Ventricle: Heavy smoke noted in the LV apex which is suggestive of soft thrombus and very low flow (image 52). Paired LV mid-ventricular false tendons (normal variant). Left ventricular ejection fraction, by estimation, is <20%. Left ventricular ejection fraction by PLAX is 13 %. The left ventricle has  severely decreased function. The left ventricle demonstrates global hypokinesis. The left ventricular internal cavity size was normal in size. There is no left ventricular hypertrophy. Left ventricular diastolic parameters are indeterminate. Right Ventricle: The right ventricular size is normal. No increase in right ventricular wall thickness. Right ventricular systolic function is moderately reduced. There is normal pulmonary artery systolic pressure. The tricuspid regurgitant velocity is 2.29 m/s, and with an assumed right atrial pressure of 8 mmHg, the estimated right ventricular systolic pressure is 29.0 mmHg. Left Atrium: Left atrial size was moderately dilated. Right Atrium: Right atrial size was normal in size. Pericardium: There is no evidence of pericardial effusion. Mitral Valve: The mitral valve is grossly normal. Mild mitral valve regurgitation. Tricuspid Valve: The tricuspid valve is grossly normal. Tricuspid valve regurgitation is mild. Aortic Valve: The aortic valve is tricuspid. Aortic valve regurgitation is not visualized. No aortic stenosis is present. Pulmonic Valve: The pulmonic valve was normal in structure. Pulmonic valve regurgitation is not visualized. Aorta: The aortic root and ascending aorta are structurally normal, with no evidence of dilitation. IAS/Shunts: No atrial level shunt detected by color flow Doppler.  LEFT VENTRICLE PLAX 2D LV EF:         Left            Diastology                ventricular     LV e' medial:  3.15 cm/s                ejection        LV e' lateral: 3.37 cm/s                fraction by                PLAX is 13                %. LVIDd:  5.30 cm LVIDs:         5.00 cm LV PW:         0.90 cm LV IVS:        0.90 cm LVOT diam:     2.10 cm LV SV:         29 LV SV Index:   16 LVOT Area:     3.46 cm  RIGHT VENTRICLE            IVC RV Basal diam:  2.70 cm    IVC diam: 2.30 cm RV S prime:     7.18 cm/s TAPSE (M-mode): 1.3 cm LEFT ATRIUM             Index LA diam:         3.80 cm 2.09 cm/m LA Vol (A2C):   97.2 ml 53.38 ml/m LA Vol (A4C):   59.3 ml 32.56 ml/m LA Biplane Vol: 76.6 ml 42.06 ml/m  AORTIC VALVE LVOT Vmax:   56.80 cm/s LVOT Vmean:  37.200 cm/s LVOT VTI:    0.084 m  AORTA Ao Root diam: 3.10 cm MR Peak grad: 53.6 mmHg   TRICUSPID VALVE MR Mean grad: 35.0 mmHg   TR Peak grad:   21.0 mmHg MR Vmax:      366.00 cm/s TR Vmax:        229.00 cm/s MR Vmean:     286.0 cm/s                           SHUNTS                           Systemic VTI:  0.08 m                           Systemic Diam: 2.10 cm Vinie Maxcy MD Electronically signed by Vinie Maxcy MD Signature Date/Time: 12/29/2023/12:00:56 PM    Final    DG CHEST PORT 1 VIEW Result Date: 12/29/2023 CLINICAL DATA:  Fever. EXAM: PORTABLE CHEST 1 VIEW COMPARISON:  11/05/2023 FINDINGS: Stable enlarged cardiac silhouette. Tortuous and partially calcified thoracic aorta. Clear lungs with normal vascularity. Mild diffuse chronic interstitial prominence with resolved superimposed interstitial pulmonary edema. Unremarkable bones. IMPRESSION: 1. No acute abnormality. 2. Stable cardiomegaly. 3. Mild chronic interstitial lung disease. Electronically Signed   By: Elspeth Bathe M.D.   On: 12/29/2023 09:59     Medications:     Current Medications:  atorvastatin   80 mg Oral Daily   Chlorhexidine  Gluconate Cloth  6 each Topical Daily   ezetimibe   10 mg Oral Daily   insulin  aspart  0-9 Units Subcutaneous Q4H   [START ON 12/30/2023] insulin  glargine  5 Units Subcutaneous Daily   lidocaine   1 Application Urethral Once   sodium bicarbonate   1,300 mg Oral BID   sodium zirconium cyclosilicate   5 g Oral Daily    Infusions:  sodium chloride      DOBUTamine  2.5 mcg/kg/min (12/29/23 1415)   linezolid  (ZYVOX ) IV     norepinephrine  (LEVOPHED ) Adult infusion 8 mcg/min (12/29/23 1335)   piperacillin -tazobactam (ZOSYN )  IV       Assessment/Plan   1. Mixed Septic and Cardiogenic Shock  - WBC 5>>22K. Rigors on exam.  Hypotensive w/ LA 3.5. Co-ox 46%. Echo EF < 20%, RV mildly reduced - BCx collected - IV abx per CCM, on linezolid  + Zosyn   -  continue NE support to maintain MAP > 65 - continue DBA 2.5 for inotropic support. Follow co-ox  - follow culture data and follow Lactate for clearance   2. Acute on Chronic Systolic Heart Failure - Diagnosed 2022. Echo 1/22 EF 25-30%, RV normal. Subsequent cath showed single vessel obstructive CAD involving a ramus intermediate branch that was 80% stenosed, 35% ostial-mid LAD, 50% distal LAD, 60% prox-mid RCA, 1st diag, 50% ost-prox LCx 50% distal RCA, 30%. LV dysfunction was out of proportion to the degree of CAD. RHC demonstrated normal filling pressures and preserved output (mRA 2, PAP 16/4, mPCW 5, CO 5.42, CI 2.79). - Repeat Echo 10/22 EF improved to 45-50%, RV normal. - Echo 4/24 EF 25%, RV mildly reduced, mild MR (also off meds x 4 months)  - R/l Cath 6/24: stable non-obstructive CAD. Well-compensated hemodynamics - cMRI 6/25 LVEF 24%, RVEF 35%, septal dyskinesis consistent with LBBB. Mixed cardiomyopathy, with evidence of ischemic and nonischemic etiologies. +basal septal midwall LGE, which is a scar pattern seen in nonischemic cardiomyopathies and associated with worse prognosis. There is also subendocardial LGE in apical inferolateral wall, consistent with prior infarct. In addition, there is RV insertion site LGE, which is often seen in setting of elevated pulmonary pressures. Elevated ECV (40%) and diffusely elevated T2 values  - With confirmed diagnosis of Multiple Myeloma, high concern for Cardiac Amyloidosis  - He is now critically ill w/ mixed septic and CS. Co-ox 46%. Hypotension and renal failure limits GDMT. Also w/ other multiple comorbidites. Overall prognosis is poor. For now, agree w/ continuing DBA and NE support, but would recommend palliative care consult for GOC discussion.   - CVP 14. D/w CCM. He will get a unit of blood for anemia. Will give  dose of IV Lasix  80 mg x 1 w/ transfusion    3. ABLA - CBC 6.7 - suspect due to hematuria - transfuse 1u RBCs per CCM   4. ? Seizures/Hyponatremia - EEG ordered by CCM   5. Multiple Myeloma + bone marrow biopsy this admit  - plan outpatient oncology f/u   6. AKI on CKD IIIb - previous b/l SCr ~1.5 - SCr thought this admission, 2-3.9 range - multifactorial, UTI, hypotension/shock, ? Myeloma kidney  - continue current support per above  - if plan is to remain aggressive w/ care, would recommend nephrology consult, though suspect he would be a poor HD candidate   7. RLE Osteomyelitis - s/p BKA this admission, intital BCx were negative. Stump looks ok  - repeat BCx given new sepsis   CRITICAL CARE Performed by: Caffie Shed   Total critical care time: 20 minutes  Critical care time was exclusive of separately billable procedures and treating other patients.  Critical care was necessary to treat or prevent imminent or life-threatening deterioration.  Critical care was time spent personally by me on the following activities: development of treatment plan with patient and/or surrogate as well as nursing, discussions with consultants, evaluation of patient's response to treatment, examination of patient, obtaining history from patient or surrogate, ordering and performing treatments and interventions, ordering and review of laboratory studies, ordering and review of radiographic studies, pulse oximetry and re-evaluation of patient's condition.   Length of Stay: [address], PA-C  12/29/2023, 2:19 PM    Advanced Heart Failure Team Pager [phone] (M-F; 7a - 5p)  Please contact [facility] for night-coverage after hours (4p -7a ) and weekends on [email or web address]  Patient seen with PA, I formulated the plan  and agree with the above note.   Patient has had a prolonged hospitalization, initially admitted with UTI and right foot gangrene.  Now s/p right BKA.  He was  also diagnosed with multiple myeloma this admission.  He was planned for discharge to SNF this week, but developed gross hematuria over the last day with hypotension, hgb 6.7, lactate 3.5, WBCs 22.5.  Creatinine up to 3.96 with BUN 126, Na 121.   Co-ox 45% but this corresponds to CI 2.5 for hgb 6.7.  He is now on dobutamine  2.5 + NE 8.  CVP 9.   General: Tachypneic Neck: JVP 8 cm, no thyromegaly or thyroid nodule.  Lungs: Clear to auscultation bilaterally with normal respiratory effort. CV: Nondisplaced PMI.  Heart regular S1/S2, no S3/S4, no murmur.  No peripheral edema.  No carotid bruit. Unable to palpate left pedal pulses.  Abdomen: Soft, nontender, no hepatosplenomegaly, no distention.  Skin: Intact without lesions or rashes.  Neurologic: Alert and oriented x 3.  Psych: Normal affect. Extremities: No clubbing or cyanosis. S/p right BKA.  HEENT: Normal.   1. Shock: Suspect mixed septic/cardiogenic.  Echo with EF < 20%, LV thrombus, moderate RV dysfunction.  Now on NE 8 + dobutamine  2.5 with co-ox 45% but CI 2.5 by Fick calculation given hgb 6.7. CVP currently 9.  - Not markedly volume overloaded and BUN/creatinine very high, would give Lasix  with blood but then follow.  - Continue dobutamine  2.5.  - Wean NE as tolerated.  - Broad spectrum abx with linezolid /Zosyn .  2. Acute on chronic systolic CHF: Echo with EF < 20%, LV thrombus, moderate RV dysfunction.  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  Last cath in 6/24 showed 70% proximal LCx stenosis. Now with shock, see plan above.  3.  Gross hematuria: Developed over the last day.  Hgb down to 6.7.   - Transfusing 1 unit PRBCs.  - No anticoagulation for now.  - Will need urology consult if not clearing up tomorrow. 4. 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  Unfortunately, holding anticoagulation right now with gross hematuria and marked anemia.  5. Multiple myeloma: New diagnosis this admission.  6. 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 7. ID: Suspect septic shock as above, may be from UTI/urosepsis.  - Continue linezolid /Zosyn .  8. AKI on CKD stage 3: Baseline creatinine 1.5, up to 3.96 today.  BUN 126.  Suspect due to hypotension/shock.  - Follow closely, may require CVVH.  9. Hyponatremia: Na most recently 122.   - Fluid restrict for now, may need CVVH.   CRITICAL CARE Performed by: Ezra Shuck  Total critical care time: 70 minutes  Critical care time was exclusive of separately billable procedures and treating other patients.  Critical care was necessary to treat or prevent imminent or life-threatening deterioration.  Critical care was time spent personally by me on the following activities: development of treatment plan with patient and/or surrogate as well as nursing, discussions with consultants, evaluation of patient's response to treatment, examination of patient, obtaining history from patient or surrogate, ordering and performing treatments and interventions, ordering and review of laboratory studies, ordering and review of radiographic studies, pulse oximetry and re-evaluation of patient's condition.  Ezra Shuck 12/29/2023 4:56 PM

## 2023-12-29 NOTE — Progress Notes (Signed)
 Labs reviewed Mixed resp alk and metabolic acidosis by ABG Started on dobutamine  2.5 mcg/kg/min  Co-ox reported now 26 (wonder about accuracy) No sig change in hemodynamic  Norepi dose coming down Getting blood Will repeat co-ox after blood  Na 122 by chem (121 by ISTAT) but certainly not worse  serum osmo is 310, glucose 150s so would not think glucose contributing. Bicarb replacement was added today. This would not contribute to his Na being low but could affect tonicity - Getting 1x dose of lasix  w/ blood. Want to keep euvolemic Will cont to trend chemistries Dc bicarb (not really sure there is an indication here)  Repeat am osmo Hold off on HT saline (for his hyponatremia) he's already hyperosmolar...  suspect some of the hyponatremia is d/t the underlying Multiple myeloma and protein gap  Cont to support hemodynamics w/ NE and dobut Keep euvolemic Cont abx  Hold ac w/ hematuria If clears can try IV heparin  If does NOT clear may need to have urology see   Maude FORBES Banner ACNP-BC [facility] Pulmonary/Critical Care Pager # [phone] OR # [phone] if no answer

## 2023-12-29 NOTE — Progress Notes (Signed)
 IMTS Cross Cover Note  Patient had an acute drop in sodium from 130 yesterday to 121 this morning. Creatinine worse at 3.96 from 3.48. Noted to be tachycardic and hypotensive on chart review. Patient evaluated at bedside. He endorsed no changes in his symptoms from yesterday. The patient denied headache, dizziness, confusion, nausea, vomiting, shortness of breath, and changes in vision. The patient was appropriately conversant and said that he felt fine.   Exam: Vitals:   12/29/23 0348 12/29/23 0509  BP: (!) 90/57 99/66  Pulse: (!) 118 (!) 121  Resp: 20 20  Temp: 98.4 F (36.9 C) 98.6 F (37 C)  SpO2: 100% 99%    Latest Reference Range & Units 12/28/23 05:31 12/29/23 04:22  Sodium 135 - 145 mmol/L 130 (L) 121 (L)  Potassium 3.5 - 5.1 mmol/L 4.8 5.1  Chloride 98 - 111 mmol/L 91 (L) 84 (L)  CO2 22 - 32 mmol/L 27 20 (L)  Glucose 70 - 99 mg/dL 897 (H) 816 (H)  BUN 8 - 23 mg/dL 883 (H) 873 (H)  Creatinine 0.61 - 1.24 mg/dL 6.51 (H) 6.03 (H)  Calcium  8.9 - 10.3 mg/dL 9.2 8.8 (L)  Anion gap 5 - 15  12 17  (H)  Phosphorus 2.5 - 4.6 mg/dL 4.7 (H) 3.5  Albumin 3.5 - 5.0 g/dL 2.2 (L) 2.3 (L)  GFR, Estimated >60 mL/min 19 (L) 16 (L)  (L): Data is abnormally low (H): Data is abnormally high  General: Lying in bed, comfortable, no acute distress Cardio: Tachycardic with regular rhythm. Eleveated JVD to 4 cm under the mandible. No peripheral edema Pulm: No increased work of breathing. Lateral lung fields clear to auscultation bilaterally Neuro: Oriented to self, place, and time. Pupils equal and reactive to light  Plan: Patient without symptoms of hyponatremia. Exam shows JVD but no peripheral edema or lung crackles. Patient did have over 2L of oral intake yesterday. Unclear cause of his hyponatremia at this time but patient is stable. Will continue to monitor his vital signs for worsening tachycardia or hypotension.  Melvenia Morrison Internal Medicine Resident PGY-1 Please contact the  on-call pager after 5 pm and on weekends at [phone]\

## 2023-12-29 NOTE — Discharge Instructions (Addendum)
 [employer] assistance programs Crisis assistance programs  -[facility] Homelessness Arts development officer. If you are experiencing homelessness in [place], [place] , your first point of contact should be Pensions consultant. You can reach Coordinated Entry by calling ([phone]) [phone] or by emailing [email or web address] [email or web address].  Community access points: [employer] [id number], [place]) every Tuesday from 9am-10am. [facility] ([address] [place]. [address], [place]) every Wednesday from 8am-9am.   -[place] Coordinated Re-entry Daniel [place]cal[place]: Dial 211 and request. Offers referrals to homeless shelters in the area.    -The [employer] 208-079-2338) offers several services to local families, as funding allows. The Emergency Assistance Program (EAP), which they administer, provides household goods, free food, clothing, and financial aid to people in need in the [place]  area. The EAP program does have some qualification, and counselors will interview clients for financial assistance by written referral only. Referrals need to be made by the Department of Social Services or by other EAP approved human services agencies or charities in the area.  -Open [facility] Ministries of [employer], which can be reached at ([phone], offers emergency assistance programs for those in need of help, such as food, rent assistance, a soup kitchen, shelter, and clothing. They are based in [facility]  but provide a number of services to those that qualify for assistance.   [facility] Department of Social Services may be able to offer temporary financial assistance and cash grants for paying rent and utilities, Help may be provided for local county residents who may be experiencing personal crisis when other resources, including government programs, are not available. Call [phone]  -High [employer] is a [employer] agency, The organization can offer emergency assistance for paying rent, [employer], utilities, food, household products and furniture. They offer extensive emergency and transitional housing for families, children and single women, and also run a Boy's and D[employer] Food. Thrift Shops, Secondary school teacher, and other aid offered too. [address] [place], [place]  [address], ([phone]  -[place] Low Income Energy Assistance Program -- This is offered for [facility] families. The federal government created [employer] Program provides a one-time cash grant payment to help eligible low-income families pay their electric and heating bills. [address], [place], [place]  [address], ([phone]  -[place] Emergency Assistance -- A program offers emergency utility and rent funds for greater [employer] area residents. The program can also provide counseling and referrals to charities and government programs. Also provides food and a free meal program that serves lunch Mondays - Saturdays and dinner seven days per week to individuals in the community. [address] [place], [place]  [address], [phone]  -[place] [employer] - Offers affordable apartment and housing communities across      [place] and [place]. The low income and seniors can access public housing, rental assistance to qualified applicants, and apply for the section 8 rent subsidy program. Other programs include Chiropractor and Engineer, maintenance. [address], [place],   [address], dial [phone].  -The [facility] provides transitional housing to veterans and the disabled. Clients will also access other services too, including assistance in applying for Disability, life skills classes, case management, and assistance in finding permanent housing. [address] [place], [place]  [place] [address], call [phone]  -[place] Transitional Housing through [facility] is for people who were just  evicted or that are formerly homeless. The non-profit will also help then gain self-sufficiency, find a home or apartment to live in, and also provides information on rent assistance when needed. Phone [phone]  -The [place] [facility] [facility] helps low income, elderly, or disabled residents in seven counties in the [place] ([place], [place], [place], [place], [place], [place], [place], and [place]) save energy and reduce their utility bills by improving energy efficiency. Phone [phone].  -[employer] is located in the [place] [facility] in the [employer], [address], [place], [name] [address]. Parking is in the rear of the building. Phone: [phone]   General Email: Hali ann  GHC provides free housing counseling assistance in locating affordable rental housing or housing with support services for families and individuals in crisis and the chronically homeless. We provide potential resources for other housing needs like utilities. Our trained counselors also work with clients on budgeting and financial literacy in effort to empower them to take control of their financial situations. [employer] collaborates with homeless service providers and other stakeholders as part of the [employer] COC (Continuum of Care). The (COC) is a regional/local planning body that coordinates housing and services funding for homeless families and individuals. The role of [facility]HC in the COC is through housing counseling to work with people we serve on diversion strategies for those that are at imminent risk of becoming homeless. We also work with the Coordinated Assessment/Entry Specialist who attempts to find temporary solutions and/or connects the people  to Housing First, Rapid Re-housing or transitional housing programs. Our Homelessness Prevention Housing Counselors meet with clients on business days (Monday-Fridays, except scheduled holidays) from 8:30 am to 4:30 pm.  Legal assistance for evictions, foreclosure, and more -If you need free legal advice on civil issues, such as foreclosures, evictions, Electronics engineer, government programs, domestic issues and more, Armed forces operational officer Aid of [place]  [facility]) is a Associate Professor firm that provides free legal services and counsel to lower income people, seniors, disabled, and others, The goal is to ensure everyone has access to justice and fair representation. Call them at [phone].  [facility] for [facility] and Community Studies can provide info about obtaining legal assistance with evictions. Phone ([phone].  Data processing manager  The [employer], [employer]. offers job and Dispensing optician. Resources are focused on helping students obtain the skills and experiences that are necessary to compete in today's challenging and tight job market. The non-profit faith-based community action agency offers internship trainings as well as classroom instruction. Classes are tailored to meet the needs of people in the [facility] region. [place], [name] [address], [phone]  Foreclosure prevention/Debt Services Family Services of the [employer] Credit Counseling Service inludes debt and foreclosure prevention programs for local families. This includes money management, financial advice, budget review and development of a written action plan with a Pensions consultant to help solve specific individual financial problems. In addition, housing and mortgage counselors can also provide pre- and post-purchase homeownership counseling, default resolution counseling (to prevent foreclosure) and reverse mortgage counseling. A Debt Management Program allows  people and families with a high level of credit card or medical debt to consolidate and repay consumer debt and loans to creditors and rebuild positive credit ratings and scores. Contact (336) D7650557.  Community clinics in [place] -Health Department [facility]: [address] [place], [place], [address]. [phone].  -Health Department [place] [facility]: [phone]  [address], [facility], [address]. [phone].  -[facility] offers medical care through a group of doctors, pharmacies and other healthcare related agencies that offer services for low income, uninsured adults in [place]. Also offers adult Dental care and assistance with applying for an [employer]. Call [phone].   [facility]. This center provides low-cost health care to those without health insurance. Services offered include an onsite pharmacy. Phone [phone]. [address], [address], [place].  -Medication Assistance Program serves as a link between pharmaceutical companies and patients to provide low cost or free prescription medications. This service is available for residents who meet certain income restrictions and have no insurance coverage. PLEASE CALL ([phone] [place]) OR [phone] ([place])  -One Step Further: Materials engineer, The [employer] Support & Nutrition Program, [employer]. Call [phone]/ [phone].  Food pantry and assistance -[facility]Food Bank: [address], [place] [address]. Phone [phone]  -[facility]less[facility] Food Pantry: [address] [place], [name] [address]. [phone].  -Missionary Ministry: has the purpose of visiting the sick and shut-ins and provide for needs in the surrounding communities. Call 2497102802. Email: [email or web address] .[email or web address] This program provides: Food box for seniors, Financial assistance, Food to meet basic  nutritional needs.  -Meals on Wheels with Senior Resources: [facility] residents age 57 and over who are homebound and unable to obtain and prepare a nutritious meal for themselves are eligible for this service. There may be a waiting list in certain parts of [facility] if the route in that area is full. If you are in [facility] and [place] call [phone] to register. For all other areas call ([phone] to register.  -Greater Dietitian: [email or web address]://findfood.[email or web address]  TRANSPORTATION: -[employer] Department of Health: Call [facility] and [employer] at [phone] for details. AttractionGuides.es  -Access [place]: Access [place] is the [employer] Agency's shared-ride transportation service for eligible riders who have a disability that prevents them from riding the fixed route bus. Call [phone]. Access [place] riders must pay a fare of $1.50 per trip, or may purchase a 10-ride punch card for $14.00 ($1.40 per ride) or a 40-ride punch card for $48.00 ($1.20 per ride).  -The Shepherd's WHEELS rideshare transportation service is provided for senior citizens (60+) who live independently within [place] city limits and are unable to drive or have limited access to transportation. Call [phone] to schedule an appointment.  -[place] Transportation: For Medicare or Medicaid recipients call [phone]?[facility] Ambulance, wheelchair fleeta, and ambulatory quotes available.   FLEEING VIOLENCE: -Family Services of the [place]- 24/7 Crisis line 7140862283) -[facility] Justice Centers: (336) [phone]-[phone] 8105193580)  [place] 2-1-1 is another useful way to locate resources in the community. Visit ShedSizes.ch to find service information online. If you need additional assistance, 2-1-1 Referral Specialists are available 24 hours a day, every day by dialing  2-1-1 or (647) [phone] from any phone. The call is free, confidential, and available in any language.  Affordable Housing Search http://www.nchousingsearch.[facility])   M-F 8a-3p [address]  [place], [name] [address] [phone] Services include: laundry, barbering, support groups, case management, phone & computer access, showers, AA/NA mtgs, mental health/substance abuse nurse, job skills class, disability information, VA assistance, spiritual classes, etc. Winter Shelter available when temperatures are less than 32 degrees.   HOMELESS SHELTERS [facility] Night [facility] at [facility]- Call ([phone] ext. 347  or ext. 336. Located at [address], [place], [name] [address]  [facility] Mens Shelter- Call [phone]. Located at [address] [place] [address].  [facility] [employer]. Call [phone]. Office located at [address], [place] [address].  [facility] Family Housing through [place] ([phone].  [facility] Family Shelter- Call [phone]. Located at [address] [place], [place], [name] [address].  Room at the Inn-For Pregnant mothers. Call [phone]. Located at [address] [place], [address].  [facility]-For men in [place]. Call [phone]. [facility]ter in [place]. Call [phone].  Home of [employer] for [employer] ([phone]. Office located at [address] [place], [address].  [employer] be agreeable to help with chores. Call [phone] ext. 5000.  Men's: [address] [place], [place] [address]. Women's: [facility]  [address] [place], [name] [address]  Crisis Services [facility] Mobile Crisis Management- [phone]  [facility] [address], [place], [name] [address]. Phone: [phone]  Homeless Shelter  List:  Health and safety inspector [facility]) [address] [place], [name]  Phone: [phone]  [facility] Men[facility] [address], [place], [name] [address] Phone: [phone]  [facility] (Women only) [address]SABRA Isadora [place], [name] [address] Phone: [phone]  [facility] Network [address] [place], [name] [address] Phone: [phone]  [facility] of [facility]: [phone]. [address] [place], [name] [address] Phone: [phone]  [facility] Shel[facility]  [address], [place], [name] [address] (Check in at 6:00PM for placement at a local shelter) Phone: [phone]

## 2023-12-29 NOTE — Procedures (Signed)
 Patient Name: Derek Thompson  MRN: [id number]  Epilepsy Attending: Arlin MALVA Krebs  Referring Physician/Provider: Jenna Maude BRAVO, NP  Date: 12/29/2023 Duration: 22.26 mins  Patient history: 63yo M with ams. EEG to evaluate for seizure  Level of alertness: awake/lethargic   AEDs during EEG study: None  Technical aspects: This EEG study was done with scalp electrodes positioned according to the 10-20 International system of electrode placement. Electrical activity was reviewed with band pass filter of 1-70Hz , sensitivity of 7 uV/mm, display speed of 34mm/sec with a 60Hz  notched filter applied as appropriate. EEG data were recorded continuously and digitally stored.  Video monitoring was available and reviewed as appropriate.  Description: EEG showed continuous generalized 3 to 6 Hz theta-delta slowing. Hyperventilation and photic stimulation were not performed.     ABNORMALITY - Continuous slow, generalized  IMPRESSION: This study is suggestive of moderate to severe diffuse encephalopathy. No seizures or epileptiform discharges were seen throughout the recording.  Meital Riehl O Crayton Savarese

## 2023-12-29 NOTE — Progress Notes (Signed)
 Evaluated at bedside for hypotension.  On exam he is intermittently agitated and somnolent.  Worsening hematuria and hyponatremia over the last 24 hours.  Denies new pain.  Blood pressure around 70/40, heart rate 130s to 140s.  Extremities are warm, peripheral pulses are weak.  No apparent JVD.  Right lower quadrant abdominal tenderness present.  Infectious workup started earlier for fever to 100.4 F this morning.  Hypotension is refractory to IV fluid bolus.  Impression is for mixed septic and cardiogenic shock.  CXR looks okay.  Start broad-spectrum IV antibiotic.  Start norepinephrine .  Stat echocardiogram looks qualitatively similar to last echocardiogram in June, in terms of LV function.  Stat CT abdomen/pelvis with contrast for RLQ tenderness.  Requesting PCCM to see in consult for shock requiring vasopressors.

## 2023-12-29 NOTE — Progress Notes (Signed)
 PT Cancellation Note  Patient Details Name: Derek Thompson MRN: [id number] DOB: 1960/05/31   Cancelled Treatment:    Reason Eval/Treat Not Completed: (P) Medical issues which prohibited therapy Pt transferred to ICU for septic shock. PT will follow back for treatment next week.   [name] B. [name] PT, DPT Acute Rehabilitation Services Please use secure chat or  Call Office [phone]    [name] [name] 12/29/2023, 1:06 PM

## 2023-12-29 NOTE — NC FL2 (Signed)
 [place]  MEDICAID FL2 LEVEL OF CARE FORM     IDENTIFICATION  Patient Name: Derek Thompson Birthdate: 31-May-1960 Sex: male Admission Date (Current Location): 11/02/2023  [facility] and [place] Number:  Producer, television/film/video and Address:  [place], [address], [place], [name] [address]      Provider Number: [id number]  Attending Physician Name and Address:  Shawn Sick, MD  Relative Name and Phone Number:  Kendricks Reap; Mother; [phone]    Current Level of Care: Hospital Recommended Level of Care: Skilled Nursing Facility Prior Approval Number:    Date Approved/Denied:   PASRR Number: [id number]  Discharge Plan: SNF    Current Diagnoses: 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   Monoclonal gammopathy 11/12/2023   Hypoalbuminemia 11/08/2023   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 (HCC) 11/04/2023   Chronic anemia 11/04/2023   Generalized weakness 11/03/2023   Sinus tachycardia 11/03/2023   HFrEF (heart failure with reduced ejection fraction) (HCC) 11/02/2023   Chronic osteomyelitis involving lower leg, right (HCC) 11/02/2023   Frailty 11/02/2023   Urinary retention 11/02/2023   Pyelonephritis and Cystitis 10/14/2023   Hyponatremia 10/12/2023   Hyperglycemia 10/12/2023   Tachycardia 10/11/2023   Screening for lung cancer 09/11/2023   Housing instability 09/11/2023   Gangrene of right foot (HCC) 09/02/2023   LV (left ventricular) mural thrombus without MI (HCC) 06/19/2023   NSVT (nonsustained ventricular tachycardia) (HCC) 06/19/2023   Mixed hyperlipidemia 06/19/2023   Osteomyelitis of right foot (HCC) 06/15/2023   AKI (acute kidney injury) (HCC) 06/14/2023   Anemia 06/14/2023   Hyperkalemia 11/22/2022   Iron  deficiency anemia 08/27/2022   MDD (major depressive disorder) 08/27/2022   Malignant neoplasm  of prostate (HCC)    CAD (coronary artery disease) 06/10/2020   Hypertension 06/10/2020   DM2 (diabetes mellitus, type 2) (HCC) 06/10/2020   Chronic HFrEF (heart failure with reduced ejection fraction) (HCC)     Orientation RESPIRATION BLADDER Height & Weight     Self, Time, Situation, Place  Normal (Room Air) Continent, Indwelling catheter Weight: 88 lb 6.5 oz (40.1 kg) Height:  6' (182.9 cm)  BEHAVIORAL SYMPTOMS/MOOD NEUROLOGICAL BOWEL NUTRITION STATUS      Incontinent Diet (Please see discharge summary)  AMBULATORY STATUS COMMUNICATION OF NEEDS Skin   Extensive Assist Verbally Surgical wounds (Closed Surgical Incision Knee Right)                       Personal Care Assistance Level of Assistance  Bathing, Feeding, Dressing Bathing Assistance: Maximum assistance Feeding assistance: Maximum assistance Dressing Assistance: Maximum assistance     Functional Limitations Info  Sight Sight Info: Impaired (R and L (Eyeglasses)) Hearing Info: Adequate Speech Info: Adequate    SPECIAL CARE FACTORS FREQUENCY  PT (By licensed PT), OT (By licensed OT)     PT Frequency: 5x OT Frequency: 5x            Contractures Contractures Info: Not present    Additional Factors Info  Code Status, Allergies, Insulin  Sliding Scale, Isolation Precautions Code Status Info: Full Code Allergies Info: Cefepime    Insulin  Sliding Scale Info: Please see discharge summary Isolation Precautions Info: Air/Contact Precautions (MRSA)     Current Medications (12/29/2023):  This is the current hospital active medication list Current Facility-Administered Medications  Medication Dose Route Frequency Provider Last Rate Last  Admin   acetaminophen  (TYLENOL ) tablet 650 mg  650 mg Oral Q6H PRN Gerome Maurilio HERO, PA-C   650 mg at 12/29/23 9185   aspirin  EC tablet 81 mg  81 mg Oral Daily Gerome Maurilio M, PA-C   81 mg at 12/29/23 0825   atorvastatin  (LIPITOR ) tablet 80 mg  80 mg Oral Daily Pham, Minh Q,  RPH-CPP   80 mg at 12/29/23 0825   Chlorhexidine  Gluconate Cloth 2 % PADS 6 each  6 each Topical Daily Shawn Sick, MD   6 each at 12/29/23 1015   ezetimibe  (ZETIA ) tablet 10 mg  10 mg Oral Daily Gerome Maurilio M, PA-C   10 mg at 12/29/23 9173   feeding supplement (GLUCERNA SHAKE) (GLUCERNA SHAKE) liquid 237 mL  237 mL Oral TID BM Azadegan, Maryam, MD   237 mL at 12/29/23 0837   hydrOXYzine  (ATARAX ) tablet 10 mg  10 mg Oral TID PRN Tan, Dawson, MD   10 mg at 12/29/23 0048   insulin  aspart (novoLOG ) injection 0-9 Units  0-9 Units Subcutaneous TID WC McLendon, Michael, MD   3 Units at 12/29/23 9173   insulin  glargine (LANTUS ) injection 12 Units  12 Units Subcutaneous Daily Tobie Gaines, DO   12 Units at 12/29/23 9144   lidocaine  (XYLOCAINE ) 2 % jelly 1 Application  1 Application Urethral Once McLendon, Michael, MD       linezolid  (ZYVOX ) IVPB 600 mg  600 mg Intravenous Q12H Babcock, Peter E, NP       melatonin tablet 3 mg  3 mg Oral QHS Azadegan, Maryam, MD   3 mg at 12/28/23 2133   norepinephrine  (LEVOPHED ) 4mg  in (0.016 mg/mL) premix infusion  0-10 mcg/min Intravenous Titrated Shawn Sick, MD 37.5 mL/hr at 12/29/23 1114 10 mcg/min at 12/29/23 1114   nutrition supplement (JUVEN) (JUVEN) powder packet 1 packet  1 packet Oral BID BM Azadegan, Maryam, MD   1 packet at 12/29/23 9144   Oral care mouth rinse  15 mL Mouth Rinse PRN Gerome Maurilio M, PA-C       piperacillin -tazobactam (ZOSYN ) IVPB 2.25 g  2.25 g Intravenous Q8H Millen, Jessica B, RPH       polyethylene glycol (MIRALAX  / GLYCOLAX ) packet 17 g  17 g Oral Daily PRN Smucker, Nathanael, MD   17 g at 12/29/23 0000   scopolamine  (TRANSDERM-SCOP) 1 MG/3DAYS 1 mg  1 patch Transdermal Q72H PRN Smucker, Melvenia, MD   1 mg at 12/25/23 0216   sodium bicarbonate  tablet 1,300 mg  1,300 mg Oral BID King, Olivia, DO       sodium zirconium cyclosilicate  (LOKELMA ) packet 5 g  5 g Oral Daily Myrna, Olivia, DO   5 g at 12/29/23 9145     Discharge  Medications: Please see discharge summary for a list of discharge medications.  Relevant Imaging Results:  Relevant Lab Results:   Additional Information [id number]  Lauraine FORBES Saa, LCSWA

## 2023-12-29 NOTE — Progress Notes (Addendum)
 HD#[id number] SUBJECTIVE:  Patient Summary: Derek Thompson is a 63 y.o. with a pertinent PMH of HFrEF, urinary retention s/p prostate cancer tx, chronic osteomyelitis of RLE, and CKD, who presented with weakness and admitted for hyponatremia on 7/3. Since admission, he has undergone SPEP work up for MM, R BKA, and medical optimization of his heart failure and AKI 2/2 retention. He is medically stable for discharge and awaiting SNF  Overnight Events: n/a  Interim History: The patient was seen and examined at the bedside this morning. He was more somnolent this morning but would answer orientation questions.   10:36am--called to the bedside this morning for BP of 62/42 and HR 140's. Pt confused and attempting to get out of bed upon evaluation. Rapid response team at bedside. NS bolus initiated with no improvement. Levophed  initiated. PPCM called to evaluate.   OBJECTIVE:  Vital Signs: Vitals:   12/28/23 2337 12/29/23 0348 12/29/23 0509 12/29/23 0700  BP: 108/64 (!) 90/57 99/66 106/69  Pulse: (!) 110 (!) 118 (!) 121 99  Resp: 20 20 20 18   Temp: 98.5 F (36.9 C) 98.4 F (36.9 C) 98.6 F (37 C) (!) 100.4 F (38 C)  TempSrc: Oral Oral Oral Oral  SpO2:  100% 99% 99%  Weight:      Height:       Supplemental O2: Room Air   Filed Weights   12/11/23 0500 12/14/23 0500 12/19/23 0500  Weight: 62.1 kg 61.7 kg 62.7 kg     Intake/Output Summary (Last 24 hours) at 12/29/2023 1026 Last data filed at 12/29/2023 0700 Gross per 24 hour  Intake 2790 ml  Output 875 ml  Net 1915 ml   Net IO Since Admission: -47,308.5 mL [12/29/23 1026]  Physical Exam: Const: Awake, alert in NAD HENT: Normocephalic, atraumatic, mucus membranes moist Card: Regular rate and rhythm, No MRG, No pitting edema on LE's bilaterally  Resp: tachypneic with episodes of apnea Abd: Soft, mild tenderness to palpation in RLQ Extremities: Warm, dry. Incision site on Right BKA clean, without drainage or erythema. Skin  tenting improved from yesterday, but still noted to the bilateral lower extremities.  Psych: Confused. Oriented to self and place.   Patient Lines/Drains/Airways Status     Active Line/Drains/Airways     Name Placement date Placement time Site Days   Peripheral IV 12/14/23 22 G 1 Left;Lateral;Posterior Forearm 12/14/23  0747  Forearm  11   Urethral Catheter Mekides Nida, RN Coude 16 Fr. 12/08/23  1826  Coude  17   Wound 11/17/23 1030 Surgical Closed Surgical Incision Knee Right 11/17/23  1030  Knee  38            Pertinent labs and imaging:     Latest Ref Rng & Units 12/28/2023    5:31 AM 12/25/2023   11:01 AM 12/23/2023    5:48 AM  CBC  WBC 4.0 - 10.5 K/uL 5.7  5.5  6.6   Hemoglobin 13.0 - 17.0 g/dL 8.2  8.0  7.8   Hematocrit 39.0 - 52.0 % 25.4  25.6  24.5   Platelets 150 - 400 K/uL 329  330  344        Latest Ref Rng & Units 12/29/2023    4:22 AM 12/28/2023    5:31 AM 12/27/2023    4:39 AM  CMP  Glucose 70 - 99 mg/dL 816  897  891   BUN 8 - 23 mg/dL 873  883  890   Creatinine 0.61 - 1.24 mg/dL  3.96  3.48  3.90   Sodium 135 - 145 mmol/L 121  130  126   Potassium 3.5 - 5.1 mmol/L 5.1  4.8  5.3   Chloride 98 - 111 mmol/L 84  91  87   CO2 22 - 32 mmol/L 20  27  26    Calcium  8.9 - 10.3 mg/dL 8.8  9.2  8.9     DG CHEST PORT 1 VIEW Result Date: 12/29/2023 CLINICAL DATA:  Fever. EXAM: PORTABLE CHEST 1 VIEW COMPARISON:  11/05/2023 FINDINGS: Stable enlarged cardiac silhouette. Tortuous and partially calcified thoracic aorta. Clear lungs with normal vascularity. Mild diffuse chronic interstitial prominence with resolved superimposed interstitial pulmonary edema. Unremarkable bones. IMPRESSION: 1. No acute abnormality. 2. Stable cardiomegaly. 3. Mild chronic interstitial lung disease. Electronically Signed   By: Elspeth Bathe M.D.   On: 12/29/2023 09:59    ASSESSMENT/PLAN:  Assessment: Principal Problem:   Osteomyelitis of right foot (HCC) Active Problems:   CAD (coronary  artery disease)   Hyperkalemia   AKI (acute kidney injury) (HCC)   Anemia   LV (left ventricular) mural thrombus without MI (HCC)   Gangrene of right foot (HCC)   Hyponatremia   HFrEF (heart failure with reduced ejection fraction) (HCC)   Chronic osteomyelitis involving lower leg, right (HCC)   Frailty   Urinary retention   Generalized weakness   Sinus tachycardia   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 (HCC)   Chronic anemia   Hypoalbuminemia   Monoclonal gammopathy   Hx of right BKA (HCC)   Moderate protein-calorie malnutrition (HCC)   Plan: #Suspected Shock -Pt with fever early this morning. 10:36 pt becoming increasingly confused and IMTS called to bedside. Pt hypotensive with no improvement after fluid bolus. -Levophed  initiated to maintain map >65 -PCCM called to bedside for evaluation -Pt to transfer to ICU -Sepsis workup pending to look for infectious source -Pt with Hx of HFrEF LVEF 20-25%--stat echo ordered with similar EF from previous scan.  -Pt warm and dry on exam.  -Pt's emergency contact called and notified.  -Foley exchanged and culture obtained.  -Gross hematuria noted--CTAP pending -Vanc and Zosyn  initiated  #AKI on CKD #Electrolyte disturbances--hyponatremia, hyperkalemia -Renal function continues to fluctuate, however seems to be new baseline with creatinine ~2-3. Likely affected by monoclonal gammopathy  -Suspect his decreased renal function over the past few weeks was due to dehydration and poor renal perfusion.  -Suspect hyponatremia is exacerbated by monoclonal gammopathy in the form of pseudohyponatremia -Foley exchanged today -Urine studies resulted with signs of dehydration and poor urine concentration due to decreased solutes, likely from poor appetite and oral intake -Na 121 overnight--per cross coverage note, pt was asymptomatic at evaluation. -K 5.1 -Daily RFP's  #Debility  #s/p R BKA 2/2 chronic osteomyelitis #Need for  rehabilitation -s/p R BKA 7/18 with clear margins -Currently on [facility] program and participating well with therapy.  -Will continue to work with LCSW on disposition  #Chronic Foley Catheter #Urinary Retention #Hx of CAUTI -Pt with chronic urinary retention 2/2 to prostate cancer treatment. Coude catheter in place.  -s/p treatment for CAUTI x2 this admission -Coude changed today with new culture sent -Gross hematuria noted--CTAP pending  #T2DM -Continue 12 units lantus  daily -Farxiga  d/c due to worsening renal function  #HFrEF -Last echo 10/12/23 with EF 20-25% with global hypokinesis of LV -Continue metoprolol  succinate 50mg  daily -Further GDMT held due to renal function -Currently without exacerbation -Echo obtained today  #Elevated Protein Gap #Concern  for multiple myeloma -Pt has had extensive work up for elevated protein gap since admission including SPEP with M spike, positive bence jones proteins, and BM biopsy indicative of multiple myeloma -Heme consulted and pt will follow up outpatient -Suspect this is contributing to renal function and electrolyte abnormalities in the form of hyponatremia  Best Practice: Diet: Renal diet IVF: Fluids: none, Rate: None VTE: Eliquis   Code: Full  Disposition planning: Therapy Recs: SNF, DME: wheelchair Family Contact: [name] Kitty, called and notified. DISPO: Anticipated discharge pending insurance approval to SNF   Signature:  Schuyler Novak, DO [facility]  10:26 AM, 12/29/2023  On Call pager [phone]

## 2023-12-29 NOTE — Progress Notes (Signed)
 OT Cancellation Note  Patient Details Name: Derek Thompson MRN: [id number] DOB: [birth year]   Cancelled Treatment:    Reason Eval/Treat Not Completed: Medical issues which prohibited therapy (patient transferred off unit to ICU.  OT to address patient on later date.)  Jeb LITTIE Laine 12/29/2023, 12:54 PM  Dick Laine, OTA Acute Rehabilitation Services  Office [phone]

## 2023-12-29 NOTE — Progress Notes (Signed)
  Echocardiogram 2D Echocardiogram has been performed.  LAMON MAXWELL 12/29/2023, 11:47 AM

## 2023-12-29 NOTE — Progress Notes (Signed)
 Routine EEG completed, results pending Neurology review and interpretation

## 2023-12-29 NOTE — Significant Event (Addendum)
 Rapid Response Event Note   Reason for Call :  Hypotension, 65/46 (53)  Initial Focused Assessment:  Upon my arrival, patient appeared to have a vagal response while lying in bed. Very brief episode of suddenly gazed off to the right and twitching was noted in all extremities. BP 65/46. He responded to noxious stimuli and answered orientation questions appropriately. Skin is pale, warm, dry. Extremities cool. Clear breath sounds. No distress. Denies pain, lightheadedness, or dizziness. Oriented x4, however intermittently becomes restless and needs redirection.   VS: T 98.71F, BP 76/51, HR 115, RR 18, SpO2 100% on room air  Interventions:  -IVF bolus increased to a rate of 999 mL/hr for the last 500cc. Second 1L IVF bolus started at 999 mL/hr, patient complained of feeling short of breath. Rate decreased to 560mL/hr. Clear breath sounds, no increase in work of breathing noted.  -IV team consulted for 2nd PIV -Levophed  gtt initiated  Plan of Care:  -transfer to ICU  Event Summary:  MD Notified: Dr. Norrine Call Time: 1030 Arrival Time: 1035 End Time: 1215  Derek LITTIE [name], RN

## 2023-12-29 NOTE — TOC Progression Note (Addendum)
 Transition of Care [facility]) - Progression Note    Patient Details  Name: Derek Thompson MRN: [id number] Date of Birth: May 23, 1960  [facility] CM/SW Contact  Lauraine FORBES Saa, LCSWA Phone Number: 12/29/2023, 12:38 PM  Clinical Narrative:     12:38 PM Patient was transferred to ICU today. CSW updated patient's FL2 and sent FL2 to additional SNFs. CSW expanded SNF search in efforts to obtain a SNF bed. CSW provided SDOH (housing, transportation) resources.  Expected Discharge Plan: Skilled Nursing Facility Barriers to Discharge: Insurance Authorization               Expected Discharge Plan and Services In-house Referral: Clinical Social Work   Post Acute Care Choice: Skilled Nursing Facility Living arrangements for the past 2 months: Homeless Shelter                                       Social Drivers of Health (SDOH) Interventions SDOH Screenings   Food Insecurity: No Food Insecurity (11/02/2023)  Housing: High Risk (11/02/2023)  Transportation Needs: Unmet Transportation Needs (11/02/2023)  Utilities: Not At Risk (11/02/2023)  Alcohol Screen: Low Risk  (08/19/2022)  Depression (PHQ2-9): Low Risk  (10/11/2023)  Financial Resource Strain: Low Risk  (08/19/2022)  Tobacco Use: Medium Risk (11/17/2023)    Readmission Risk Interventions     No data to display

## 2023-12-29 NOTE — Plan of Care (Signed)

## 2023-12-29 NOTE — Progress Notes (Signed)
 MD on call made aware of patient requesting medication for constipation and blood clots noted on the catheter. Encouraged patient to drink more fluids.

## 2023-12-30 ENCOUNTER — Inpatient Hospital Stay (HOSPITAL_COMMUNITY): Payer: MEDICAID

## 2023-12-30 DIAGNOSIS — N179 Acute kidney failure, unspecified: Secondary | ICD-10-CM | POA: Diagnosis not present

## 2023-12-30 DIAGNOSIS — A419 Sepsis, unspecified organism: Secondary | ICD-10-CM | POA: Diagnosis not present

## 2023-12-30 DIAGNOSIS — R579 Shock, unspecified: Secondary | ICD-10-CM | POA: Diagnosis not present

## 2023-12-30 DIAGNOSIS — R570 Cardiogenic shock: Secondary | ICD-10-CM | POA: Diagnosis not present

## 2023-12-30 DIAGNOSIS — E871 Hypo-osmolality and hyponatremia: Secondary | ICD-10-CM | POA: Diagnosis not present

## 2023-12-30 LAB — COOXEMETRY PANEL
Carboxyhemoglobin: 1.4 % (ref 0.5–1.5)
Carboxyhemoglobin: 1.2 % (ref 0.5–1.5)
Methemoglobin: <0.7 % (ref 0.0–1.5)
Methemoglobin: 0.8 % (ref 0.0–1.5)
O2 Saturation: 54.8 %
O2 Saturation: 68.4 %
Total hemoglobin: 7.8 g/dL — ABNORMAL LOW (ref 12.0–16.0)
Total hemoglobin: 7.6 g/dL — ABNORMAL LOW (ref 12.0–16.0)

## 2023-12-30 LAB — BLOOD CULTURE ID PANEL (REFLEXED) - BCID2

## 2023-12-30 LAB — BASIC METABOLIC PANEL WITH GFR
Anion gap: 13 (ref 5–15)
Anion gap: 14 (ref 5–15)
Anion gap: 11 (ref 5–15)
BUN: 128 mg/dL — ABNORMAL HIGH (ref 8–23)
BUN: 127 mg/dL — ABNORMAL HIGH (ref 8–23)
BUN: 130 mg/dL — ABNORMAL HIGH (ref 8–23)
CO2: 21 mmol/L — ABNORMAL LOW (ref 22–32)
CO2: 21 mmol/L — ABNORMAL LOW (ref 22–32)
CO2: 22 mmol/L (ref 22–32)
Calcium: 8 mg/dL — ABNORMAL LOW (ref 8.9–10.3)
Calcium: 8.2 mg/dL — ABNORMAL LOW (ref 8.9–10.3)
Calcium: 8 mg/dL — ABNORMAL LOW (ref 8.9–10.3)
Chloride: 87 mmol/L — ABNORMAL LOW (ref 98–111)
Chloride: 87 mmol/L — ABNORMAL LOW (ref 98–111)
Chloride: 87 mmol/L — ABNORMAL LOW (ref 98–111)
Creatinine, Ser: 4.68 mg/dL — ABNORMAL HIGH (ref 0.61–1.24)
Creatinine, Ser: 4.81 mg/dL — ABNORMAL HIGH (ref 0.61–1.24)
Creatinine, Ser: 5 mg/dL — ABNORMAL HIGH (ref 0.61–1.24)
GFR, Estimated: 13 mL/min — ABNORMAL LOW (ref >60)
GFR, Estimated: 13 mL/min — ABNORMAL LOW (ref >60)
GFR, Estimated: 12 mL/min — ABNORMAL LOW (ref >60)
Glucose, Bld: 156 mg/dL — ABNORMAL HIGH (ref 70–99)
Glucose, Bld: 116 mg/dL — ABNORMAL HIGH (ref 70–99)
Glucose, Bld: 124 mg/dL — ABNORMAL HIGH (ref 70–99)
Potassium: 5 mmol/L (ref 3.5–5.1)
Potassium: 4.5 mmol/L (ref 3.5–5.1)
Potassium: 4.3 mmol/L (ref 3.5–5.1)
Sodium: 121 mmol/L — ABNORMAL LOW (ref 135–145)
Sodium: 122 mmol/L — ABNORMAL LOW (ref 135–145)
Sodium: 120 mmol/L — ABNORMAL LOW (ref 135–145)

## 2023-12-30 LAB — CBC
HCT: 22.3 % — ABNORMAL LOW (ref 39.0–52.0)
HCT: 22.6 % — ABNORMAL LOW (ref 39.0–52.0)
HCT: 21.3 % — ABNORMAL LOW (ref 39.0–52.0)
Hemoglobin: 7.3 g/dL — ABNORMAL LOW (ref 13.0–17.0)
Hemoglobin: 7.4 g/dL — ABNORMAL LOW (ref 13.0–17.0)
Hemoglobin: 7.1 g/dL — ABNORMAL LOW (ref 13.0–17.0)
MCH: 27.7 pg (ref 26.0–34.0)
MCH: 27.7 pg (ref 26.0–34.0)
MCH: 28.1 pg (ref 26.0–34.0)
MCHC: 32.7 g/dL (ref 30.0–36.0)
MCHC: 32.7 g/dL (ref 30.0–36.0)
MCHC: 33.3 g/dL (ref 30.0–36.0)
MCV: 84.5 fL (ref 80.0–100.0)
MCV: 84.6 fL (ref 80.0–100.0)
MCV: 84.2 fL (ref 80.0–100.0)
Platelets: 250 K/uL (ref 150–400)
Platelets: 237 K/uL (ref 150–400)
Platelets: 230 K/uL (ref 150–400)
RBC: 2.64 MIL/uL — ABNORMAL LOW (ref 4.22–5.81)
RBC: 2.67 MIL/uL — ABNORMAL LOW (ref 4.22–5.81)
RBC: 2.53 MIL/uL — ABNORMAL LOW (ref 4.22–5.81)
RDW: 17.7 % — ABNORMAL HIGH (ref 11.5–15.5)
RDW: 17.8 % — ABNORMAL HIGH (ref 11.5–15.5)
RDW: 17.8 % — ABNORMAL HIGH (ref 11.5–15.5)
WBC: 17.9 K/uL — ABNORMAL HIGH (ref 4.0–10.5)
WBC: 14.9 K/uL — ABNORMAL HIGH (ref 4.0–10.5)
WBC: 14.4 K/uL — ABNORMAL HIGH (ref 4.0–10.5)
nRBC: 0 % (ref 0.0–0.2)
nRBC: 0 % (ref 0.0–0.2)
nRBC: 0 % (ref 0.0–0.2)

## 2023-12-30 LAB — URINE CULTURE: Culture: NO GROWTH

## 2023-12-30 LAB — GLUCOSE, CAPILLARY
Glucose-Capillary: 164 mg/dL — ABNORMAL HIGH (ref 70–99)
Glucose-Capillary: 103 mg/dL — ABNORMAL HIGH (ref 70–99)
Glucose-Capillary: 130 mg/dL — ABNORMAL HIGH (ref 70–99)
Glucose-Capillary: 91 mg/dL (ref 70–99)
Glucose-Capillary: 147 mg/dL — ABNORMAL HIGH (ref 70–99)
Glucose-Capillary: 131 mg/dL — ABNORMAL HIGH (ref 70–99)

## 2023-12-30 LAB — LACTIC ACID, PLASMA: Lactic Acid, Venous: 1.4 mmol/L (ref 0.5–1.9)

## 2023-12-30 LAB — HEPARIN LEVEL (UNFRACTIONATED): Heparin Unfractionated: >1.1 [IU]/mL — ABNORMAL HIGH (ref 0.30–0.70)

## 2023-12-30 LAB — APTT: aPTT: 50 s — ABNORMAL HIGH (ref 24–36)

## 2023-12-30 MED ORDER — HEPARIN (PORCINE) 25000 UT/250ML-% IV SOLN
1250.0000 [IU]/h | INTRAVENOUS | Status: DC
  Administered 2023-12-30: 550 [IU]/h via INTRAVENOUS
  Administered 2024-01-01: 750 [IU]/h via INTRAVENOUS
  Administered 2024-01-02: 1100 [IU]/h via INTRAVENOUS
  Administered 2024-01-03: 1150 [IU]/h via INTRAVENOUS
  Administered 2024-01-03: 1200 [IU]/h via INTRAVENOUS
  Administered 2024-01-04: 1150 [IU]/h via INTRAVENOUS
  Administered 2024-01-05 – 2024-01-06 (×2): 1100 [IU]/h via INTRAVENOUS
  Administered 2024-01-07: 1200 [IU]/h via INTRAVENOUS
  Administered 2024-01-08 – 2024-01-09 (×2): 1250 [IU]/h via INTRAVENOUS
  Filled 2023-12-30 (×11): qty 250

## 2023-12-30 MED ORDER — ENSURE PLUS HIGH PROTEIN PO LIQD
237.0000 mL | Freq: Two times a day (BID) | ORAL | Status: DC
  Administered 2024-01-03 – 2024-01-04 (×3): 237 mL via ORAL

## 2023-12-30 NOTE — Progress Notes (Signed)
 Patient ID: Derek Thompson, male   DOB: 09-07-1960, 63 y.o.   MRN: [id number]     Advanced Heart Failure Rounding Note  Cardiologist: Vinie JAYSON Maxcy, MD  Chief Complaint: Shock Subjective:    More awake this morning, alert/conversant.   Continues dobutamine  2.5, NE 8.  Co-ox 55% this morning, CVP 4-5.  MAP stable. NSR 100s. Lactate 1.7.   BUN/creatinine remain elevated 128/4.68, Na stable 121.    WBCs 17.9, Tm 100.1. He is on linezolid /Zosyn .   Hematuria improving, less dense.  Hgb 7.3 today.    Objective:   Weight Range: 40.1 kg Body mass index is 11.99 kg/m.   Vital Signs:   Temp:  [97.8 F (36.6 C)-102 F (38.9 C)] 100.1 F (37.8 C) (08/30 0716) Pulse Rate:  [97-143] 104 (08/30 0900) Resp:  [9-35] 25 (08/30 0900) BP: (65-131)/(43-116) 112/63 (08/30 0900) SpO2:  [93 %-100 %] 100 % (08/30 0900) Weight:  [40.1 kg] 40.1 kg (08/29 1215) Last BM Date : 12/29/23  Weight change: Filed Weights   12/14/23 0500 12/19/23 0500 12/29/23 1215  Weight: 61.7 kg 62.7 kg 40.1 kg    Intake/Output:   Intake/Output Summary (Last 24 hours) at 12/30/2023 1008 Last data filed at 12/30/2023 0800 Gross per 24 hour  Intake 963.14 ml  Output 1075 ml  Net -111.86 ml      Physical Exam    General:  Well appearing. No resp difficulty HEENT: Normal Neck: Supple. JVP not elevated. Carotids 2+ bilat; no bruits. No lymphadenopathy or thyromegaly appreciated. Cor: PMI lateral. Regular rate & rhythm. No rubs, gallops or murmurs. Lungs: Clear Abdomen: Soft, nontender, nondistended. No hepatosplenomegaly. No bruits or masses. Good bowel sounds. Extremities: No cyanosis, clubbing, rash, edema. Right BKA.  Neuro: Alert & orientedx3, cranial nerves grossly intact. moves all 4 extremities w/o difficulty. Affect pleasant   Telemetry   NSR 100s (personally reviewed)  Labs    CBC Recent Labs    12/29/23 0926 12/29/23 1323 12/29/23 1620 12/30/23 0332  WBC 18.0* 22.5*  --  17.9*   NEUTROABS 16.7*  --   --   --   HGB 8.0* 6.7* 7.5* 7.3*  HCT 24.5* 20.7* 22.0* 22.3*  MCV 84.2 84.8  --  84.5  PLT 292 308  --  250   Basic Metabolic Panel Recent Labs    91/71/74 0531 12/29/23 0422 12/29/23 1200 12/29/23 2135 12/30/23 0332  NA 130* 121*   < > 121* 121*  K 4.8 5.1   < > 5.3* 5.0  CL 91* 84*   < > 88* 87*  CO2 27 20*   < > 21* 21*  GLUCOSE 102* 183*   < > 185* 156*  BUN 116* 126*   < > 125* 128*  CREATININE 3.48* 3.96*   < > 4.50* 4.68*  CALCIUM  9.2 8.8*   < > 7.7* 8.0*  PHOS 4.7* 3.5  --   --   --    < > = values in this interval not displayed.   Liver Function Tests Recent Labs    12/29/23 0422 12/29/23 0926  AST  --  28  ALT  --  23  ALKPHOS  --  51  BILITOT  --  0.8  PROT  --  8.3*  ALBUMIN 2.3* 2.3*   No results for input(s): LIPASE, AMYLASE in the last 72 hours. Cardiac Enzymes Recent Labs    12/29/23 1200  CKTOTAL 257  CKMB 27.4*    BNP: BNP (last 3 results)  Recent Labs    06/29/23 0447 11/02/23 1552 11/03/23 0824  BNP 3,373.6* 4,136.5* 3,980.4*    ProBNP (last 3 results) No results for input(s): PROBNP in the last 8760 hours.   D-Dimer No results for input(s): DDIMER in the last 72 hours. Hemoglobin A1C No results for input(s): HGBA1C in the last 72 hours. Fasting Lipid Panel No results for input(s): CHOL, HDL, LDLCALC, TRIG, CHOLHDL, LDLDIRECT in the last 72 hours. Thyroid Function Tests No results for input(s): TSH, T4TOTAL, T3FREE, THYROIDAB in the last 72 hours.  Invalid input(s): FREET3  Other results:   Imaging    DG Chest Port 1 View Result Date: 12/30/2023 EXAM: 1 VIEW(S) XRAY OF THE CHEST 12/30/2023 06:13:00 AM COMPARISON: 12/29/2023 CLINICAL HISTORY: Dyspnea; Hx of: myocardial infarction and hypertension FINDINGS: LUNGS AND PLEURA: Increased interstitial pulmonary edema. No pleural effusion. No pneumothorax. HEART AND MEDIASTINUM: Right IJ CVC in place with tip terminating  over the SVC. No acute abnormality of the cardiac and mediastinal silhouettes. BONES AND SOFT TISSUES: Aortic arch calcifications. No acute osseous abnormality. IMPRESSION: 1. Increased interstitial pulmonary edema. 2. Right IJ CVC in place with tip terminating over the SVC. Electronically signed by: Norman Gatlin MD 12/30/2023 09:59 AM EDT RP Workstation: [id number]   CT ABDOMEN PELVIS W CONTRAST Result Date: 12/29/2023 CLINICAL DATA:  Acute nonlocalized abdominal pain, hematuria EXAM: CT ABDOMEN AND PELVIS WITH CONTRAST TECHNIQUE: Multidetector CT imaging of the abdomen and pelvis was performed using the standard protocol following bolus administration of intravenous contrast. RADIATION DOSE REDUCTION: This exam was performed according to the departmental dose-optimization program which includes automated exposure control, adjustment of the mA and/or kV according to patient size and/or use of iterative reconstruction technique. CONTRAST:  75mL OMNIPAQUE  IOHEXOL  350 MG/ML SOLN COMPARISON:  11/03/2023 FINDINGS: Lower chest: Interval development of mild interstitial pulmonary edema within the visualized lung bases. Cardiac size is mildly enlarged with left ventricular dilation and mural thickening involving the apex suggesting prior myocardial infarction. A 18 mm rounded polypoid mass is seen within the dilated ventricular apex likely representing a polypoid thrombus, new since MRI examination of 10/13/2023 Hepatobiliary: Moderate peribiliary/periportal edema. Marked thickening of the gallbladder wall likely reflects a component of periportal edema. No enhancing intrahepatic mass. No intra or extrahepatic biliary ductal dilation. Pancreas: Unremarkable Spleen: Multiple wedge like filling defects within the spleen in keeping with multiple splenic infarcts. Scattered benign calcified granuloma noted throughout the spleen. Adrenals/Urinary Tract: The adrenal glands are unremarkable. The kidneys are normal in size  and position. Mild perinephric inflammatory stranding. Wedge like areas of cortical hypoenhancement within the interpolar region of the left kidney. Moderate bilateral hydronephrosis and hydroureter. Marked urothelial enhancement bilaterally in keeping with urothelial inflammation. Foley catheter balloon is seen within a decompressed bladder lumen. There is, however, heterogeneously hyperdense material within the decompressed bladder lumen likely representing hemorrhagic or proteinaceous debris with admixed punctate foci of gas suggesting a sub solid consistency. There is superimposed marked circumferential bladder wall thickening and perivesicular inflammatory stranding suggesting a superimposed cystitis. Together, the findings suggest changes of an infectious cystitis with bilateral ascending urinary tract infection and at least left pyelonephritis. Stomach/Bowel: Stomach, small bowel, and large bowel unremarkable. Appendix normal. Mild ascites. No free intraperitoneal gas. Vascular/Lymphatic: Moderate aortoiliac atherosclerotic calcification. 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 No pathologic adenopathy within the abdomen and pelvis. Reproductive: Brachytherapy seeds are seen within the prostate gland Other: No abdominal wall hernia Musculoskeletal: No acute bone abnormality. No lytic or  blastic bone lesion. Osseous structures are age appropriate. IMPRESSION: 1. Interval development of mild interstitial pulmonary edema within the visualized lung bases. 2. Mild cardiomegaly with left ventricular dilation and mural thickening involving the apex suggesting prior myocardial infarction. 3. 18 mm rounded polypoid mass within the dilated ventricular apex likely representing a polypoid thrombus, new since MRI examination of 10/13/2023. 4. Multiple splenic infarcts. 5. Moderate bilateral hydronephrosis and hydroureter with marked urothelial enhancement bilaterally in keeping  with urothelial inflammation. Wedge like areas of cortical hypoenhancement within the interpolar region of the left kidney. Together, the findings suggest changes of an infectious cystitis with bilateral ascending urinary tract infection and at least left pyelonephritis. 6. Moderate peribiliary/periportal edema. Marked thickening of the gallbladder wall likely reflects a component of periportal edema. 7. Mild ascites. 8. 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 9. Aortic atherosclerosis. These results will be called to the ordering clinician or representative by the Radiologist Assistant, and communication documented in the PACS or [employer]. Aortic Atherosclerosis (ICD10-I70.0). Electronically Signed   By: Dorethia Molt M.D.   On: 12/29/2023 21:23   EEG adult Result Date: 12/29/2023 Shelton Arlin KIDD, MD     12/29/2023  4:44 PM Patient Name: Derek Thompson MRN: [id number] Epilepsy Attending: Arlin KIDD Shelton Referring Physician/Provider: Jenna Maude BRAVO, NP Date: 12/29/2023 Duration: 22.26 mins Patient history: 63yo M with ams. EEG to evaluate for seizure Level of alertness: awake/lethargic AEDs during EEG study: None Technical aspects: This EEG study was done with scalp electrodes positioned according to the 10-20 International system of electrode placement. Electrical activity was reviewed with band pass filter of 1-70Hz , sensitivity of 7 uV/mm, display speed of 6mm/sec with a 60Hz  notched filter applied as appropriate. EEG data were recorded continuously and digitally stored.  Video monitoring was available and reviewed as appropriate. Description: EEG showed continuous generalized 3 to 6 Hz theta-delta slowing. Hyperventilation and photic stimulation were not performed.   ABNORMALITY - Continuous slow, generalized IMPRESSION: This study is suggestive of moderate to severe diffuse encephalopathy. No seizures or epileptiform discharges were seen throughout  the recording. Arlin KIDD Shelton   DG Chest Port 1 View Result Date: 12/29/2023 CLINICAL DATA:  Central line placement. EXAM: PORTABLE CHEST 1 VIEW COMPARISON:  Radiograph of same day. FINDINGS: Interval placement of right internal jugular catheter is noted with distal tip in expected position of the SVC. No pneumothorax. IMPRESSION: Interval placement of right internal jugular catheter with distal tip in expected position of the SVC. Electronically Signed   By: Lynwood Landy Raddle M.D.   On: 12/29/2023 13:26   ECHOCARDIOGRAM COMPLETE Result Date: 12/29/2023    ECHOCARDIOGRAM REPORT   Patient Name:   Derek Thompson Date of Exam: 12/29/2023 Medical Rec #:  [id number]           Height:       72.0 in Accession #:    [id number]          Weight:       138.2 lb Date of Birth:  October 23, 1960           BSA:          1.821 m Patient Age:    63 years            BP:           87/63 mmHg Patient Gender: M  HR:           107 bpm. Exam Location:  Inpatient Procedure: 2D Echo (Both Spectral and Color Flow Doppler were utilized during            procedure). STAT ECHO Indications:    shock  History:        Patient has prior history of Echocardiogram examinations, most                 recent 10/12/2023. History of LV thrombus, CAD; Risk                 Factors:Diabetes and Hypertension.  Sonographer:    Tinnie Barefoot RDCS Referring Phys: [id number] PHOEBE CHUN IMPRESSIONS  1. Heavy smoke noted in the LV apex which is suggestive of soft thrombus and very low flow (image 52). Paired LV mid-ventricular false tendons (normal variant). Left ventricular ejection fraction, by estimation, is <20%. Left ventricular ejection fraction by PLAX is 13 %. The left ventricle has severely decreased function. The left ventricle demonstrates global hypokinesis. Left ventricular diastolic parameters are indeterminate.  2. Right ventricular systolic function is moderately reduced. The right ventricular size is normal. There is normal  pulmonary artery systolic pressure. The estimated right ventricular systolic pressure is 29.0 mmHg.  3. Left atrial size was moderately dilated.  4. The mitral valve is grossly normal. Mild mitral valve regurgitation.  5. The aortic valve is tricuspid. Aortic valve regurgitation is not visualized. No aortic stenosis is present. Comparison(s): Changes from prior study are noted. 10/12/2023: LVEF 20-25%, no LV apical thrombus. Conclusion(s)/Recommendation(s): Critical findings reported to Dr. Myrna and acknowledged at 12/29/2023 at 11:53 am. FINDINGS  Left Ventricle: Heavy smoke noted in the LV apex which is suggestive of soft thrombus and very low flow (image 52). Paired LV mid-ventricular false tendons (normal variant). Left ventricular ejection fraction, by estimation, is <20%. Left ventricular ejection fraction by PLAX is 13 %. The left ventricle has severely decreased function. The left ventricle demonstrates global hypokinesis. The left ventricular internal cavity size was normal in size. There is no left ventricular hypertrophy. Left ventricular diastolic parameters are indeterminate. Right Ventricle: The right ventricular size is normal. No increase in right ventricular wall thickness. Right ventricular systolic function is moderately reduced. There is normal pulmonary artery systolic pressure. The tricuspid regurgitant velocity is 2.29 m/s, and with an assumed right atrial pressure of 8 mmHg, the estimated right ventricular systolic pressure is 29.0 mmHg. Left Atrium: Left atrial size was moderately dilated. Right Atrium: Right atrial size was normal in size. Pericardium: There is no evidence of pericardial effusion. Mitral Valve: The mitral valve is grossly normal. Mild mitral valve regurgitation. Tricuspid Valve: The tricuspid valve is grossly normal. Tricuspid valve regurgitation is mild. Aortic Valve: The aortic valve is tricuspid. Aortic valve regurgitation is not visualized. No aortic stenosis is present.  Pulmonic Valve: The pulmonic valve was normal in structure. Pulmonic valve regurgitation is not visualized. Aorta: The aortic root and ascending aorta are structurally normal, with no evidence of dilitation. IAS/Shunts: No atrial level shunt detected by color flow Doppler.  LEFT VENTRICLE PLAX 2D LV EF:         Left            Diastology                ventricular     LV e' medial:  3.15 cm/s                ejection  LV e' lateral: 3.37 cm/s                fraction by                PLAX is 13                %. LVIDd:         5.30 cm LVIDs:         5.00 cm LV PW:         0.90 cm LV IVS:        0.90 cm LVOT diam:     2.10 cm LV SV:         29 LV SV Index:   16 LVOT Area:     3.46 cm  RIGHT VENTRICLE            IVC RV Basal diam:  2.70 cm    IVC diam: 2.30 cm RV S prime:     7.18 cm/s TAPSE (M-mode): 1.3 cm LEFT ATRIUM             Index LA diam:        3.80 cm 2.09 cm/m LA Vol (A2C):   97.2 ml 53.38 ml/m LA Vol (A4C):   59.3 ml 32.56 ml/m LA Biplane Vol: 76.6 ml 42.06 ml/m  AORTIC VALVE LVOT Vmax:   56.80 cm/s LVOT Vmean:  37.200 cm/s LVOT VTI:    0.084 m  AORTA Ao Root diam: 3.10 cm MR Peak grad: 53.6 mmHg   TRICUSPID VALVE MR Mean grad: 35.0 mmHg   TR Peak grad:   21.0 mmHg MR Vmax:      366.00 cm/s TR Vmax:        229.00 cm/s MR Vmean:     286.0 cm/s                           SHUNTS                           Systemic VTI:  0.08 m                           Systemic Diam: 2.10 cm Vinie Maxcy MD Electronically signed by Vinie Maxcy MD Signature Date/Time: 12/29/2023/12:00:56 PM    Final      Medications:     Scheduled Medications:  sodium chloride    Intravenous Once   atorvastatin   80 mg Oral Daily   Chlorhexidine  Gluconate Cloth  6 each Topical Daily   ezetimibe   10 mg Oral Daily   insulin  aspart  0-9 Units Subcutaneous Q4H   insulin  glargine  5 Units Subcutaneous Daily   lidocaine   1 Application Urethral Once   mupirocin  ointment   Nasal BID    Infusions:  sodium chloride       DOBUTamine  2.5 mcg/kg/min (12/30/23 0800)   linezolid  (ZYVOX ) IV 600 mg (12/30/23 1005)   norepinephrine  (LEVOPHED ) Adult infusion 8 mcg/min (12/30/23 0800)   piperacillin -tazobactam (ZOSYN )  IV 2.25 g (12/30/23 0714)    PRN Medications: Place/Maintain arterial line **AND** sodium chloride , acetaminophen , mouth rinse, polyethylene glycol    Assessment/Plan   1. Shock: Suspect mixed septic/cardiogenic.  Echo with EF < 20%, LV thrombus, moderate RV dysfunction.  Now on NE 8 + dobutamine  2.5 with co-ox 55% this morning, CVP 4 on my read.    - Not markedly volume  overloaded and BUN/creatinine very high, no Lasix  at this time.   - Continue dobutamine  2.5.  - Wean NE as tolerated.  - Broad spectrum abx with linezolid /Zosyn .  2. Acute on chronic systolic CHF: Echo with EF < 20%, LV thrombus, moderate RV dysfunction.  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  Last cath in 6/24 showed 70% proximal LCx stenosis. Now with shock, see plan above.  3.  Gross hematuria: Seems to be improving. Hgb 6.7 => 7.3 after 1 unit PRBCs.   - Repeat CBC, can start heparin  w/o bolus if stable hgb.  - Suspect due to chronic foley + UTI.  4. 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  Unfortunately, holding anticoagulation right now with gross hematuria and marked anemia. - Repeat CBC as above, start heparin  gtt without bolus as long as hgb stable.   5. Multiple myeloma: New diagnosis this admission.  6. 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 7. ID: Suspect septic shock as above, probably from UTI/urosepsis.  - Continue linezolid /Zosyn .  8. AKI on CKD stage 3: Baseline creatinine 1.5, up to 4.68 today.  BUN 128.  Suspect due to hypotension/shock.  - Follow closely, may require RRT but no urgent need at this time.  9. Hyponatremia: Na stable at 121.  He is mentally clear.   - Fluid restrict  - With AKI and CVP 4, would avoid tolvaptan.  - Would hold off on 3% saline unless sodium is  lower on repeat BMET given lack of confusion or evidence for symptomatic hyponatremia.   Discussed with Dr. Shelah.   CRITICAL CARE Performed by: Ezra Shuck  Total critical care time: 40 minutes  Critical care time was exclusive of separately billable procedures and treating other patients.  Critical care was necessary to treat or prevent imminent or life-threatening deterioration.  Critical care was time spent personally by me on the following activities: development of treatment plan with patient and/or surrogate as well as nursing, discussions with consultants, evaluation of patient's response to treatment, examination of patient, obtaining history from patient or surrogate, ordering and performing treatments and interventions, ordering and review of laboratory studies, ordering and review of radiographic studies, pulse oximetry and re-evaluation of patient's condition.    Length of Stay: 29  Ezra Shuck, MD  12/30/2023, 10:08 AM  Advanced Heart Failure Team Pager [phone] (M-F; 7a - 5p)  Please contact [facility] for night-coverage after hours (5p -7a ) and weekends on [email or web address]

## 2023-12-30 NOTE — Consult Note (Signed)
 NAME:  Derek Thompson, MRN:  [id number], DOB:  [birth year], LOS: 57 ADMISSION DATE:  11/02/2023, CONSULTATION DATE:  829 REFERRING MD:  Shawn, CHIEF COMPLAINT:  shock   History of Present Illness:  63 year old male resided at homeless shelter prior to admission presented initially back on 7/4 with acute mental status change, lethargy, hyponatremia, AKI, and hyperkalemia.  Was tachycardic on initial evaluation, and treated volume resuscitation which resulted in improved sodium up to 126, he had significant urinary retention and a Foley catheter was placed, and he was empirically started on antibiotics for presumed urinary tract infection .  His hospital course has been prolonged, clinically had improved over the first 2 weeks of therapy.  Orthopedic surgery was consulted, who also recommended BKA of the right extremity.  Early in the hospital admission was noted to have a fairly significant protein gap with a serum protein level of 7.6 and an albumin of 2.6 triggering further investigation yielding abnormal proteins SPEP with gammaglobulin 2.1 and M spike of 1.1 for which oncology was consulted.  Ultimately underwent bone marrow biopsy with findings consistent with multiple myeloma, plan was to follow-up with oncology.  He underwent BKA on 7/18. Overall it appears as though his hospital course had been improving and he had actually been cleared for hospital discharge in early August, he had been working with hospital physical therapy, and enrolled in the [facility] program. On 8/25 chart review reports patient reporting some nausea,  Still working with physical therapy.  Last Foley catheter changed on 8/8 with coud 8/26 poor appetite 8/27 function a little worse, baseline felt to be somewhere in the range of 2-3 rising up to 3.9 with BUN up to 109 Farxiga  was stopped got some Lokelma  for hyperkalemia.  Clinically appeared volume depleted encouraged to increase oral intake, and administered crystalloid.   Renal function improved a little bit down to 3.48 8/28 held off on further hydration.  The hyponatremia and renal dysfunction was also felt to be at least in part due to the amount of clonal gammopathy with pseudohyponatremia. On 8/29 patient's sodium noted to be down to 121, at that time his heart rate was 121 blood pressure 99/66 however bedside evaluation found him to be awake oriented without any focal deficits no headaches or dizziness.  Later in the morning around 7:30 AM the patient was found confused and febrile.  He was administered Tylenol , 1 L of saline was administered, blood cultures sent, and started on broad-spectrum antibiotics.  Later that morning around 1036 heart rate up in the 140s, new hematuria (this has been a intermittent problem during the hospitalization) patient more confused, blood pressure in the 60s systolically still receiving the crystalloid previously ordered stat echocardiogram obtained, rapid response called, critical care asked to evaluate at bedside.  Stat echocardiogram result showed LVEF less than 20% with a large amount of soft thrombus/slow flow at the LV apex there is moderate RV systolic dysfunction with concern for cardiogenic shock.  He was moved to the intensive care, initially there was plan for CT abdomen pelvis to rule out possible sepsis source superimposed on cardiogenic shock in the context of his delirium, fever, and hematuria however there was insufficient IV access to obtain that.  His care was assumed by critical care services as of 8/29 with a working diagnosis of undifferentiated shock, acute on chronic renal failure, acute on chronic hyponatremia, and acute metabolic encephalopathy.   Pertinent  Medical History  Heart failure with reduced EF, baseline in the  20 range, urinary retention secondary to history of prostate cancer with chronic Foley, chronic right lower extremity osteomyelitis, CKD stage IIIb.  Significant Hospital Events: Including  procedures, antibiotic start and stop dates in addition to other pertinent events   Admitted 7/4 Bone marrow biopsy 7/17 7/18 right BKA 8/27 through 8/28 rising serum creatinine and BUN 8/29 new fever, hematuria, acute mental status change, acute hyponatremia.  New shock.  Required norepinephrine . Stat echocardiogram result showed LVEF less than 20% with a large amount of soft thrombus/slow flow at the LV apex there is moderate RV systolic dysfunction with concern for cardiogenic shock.  Central line placed, Co. oximetry obtained with SpO2 in the 40s continued on norepinephrine , checking stat CBC needs to be above 7, if CBC dropping further may need to consider transfusion 8/29 CT abdomen and pelvis > LV thrombus noted, multiple splenic infarcts, moderate bilateral hydronephrosis and hydroureter with urothelial enhancement consistent with infectious cystitis and probable left pyelonephritis.  Gallbladder wall thickening, consistent with gallbladder wall edema.  Mild ascites.  Interim History / Subjective:   -Denies any discomfort -I/O- 47.5 L for the hospitalization, UOP 1275/24 hours - SCr 4.07 > 4.5 > 4.68 - CO2 21 -Persistent leukocytosis -Co. oximetry 55% on dobutamine  2.5, norepinephrine  8 - CVP 6 -CT abdomen as above  Chest x-ray 8/30 reviewed > very mild interstitial prominence, mostly clear  Objective    Blood pressure 111/67, pulse (!) 106, temperature 100.1 F (37.8 C), temperature source Oral, resp. rate 16, height 6' (1.829 m), weight 40.1 kg, SpO2 97%. CVP:  [0 mmHg-25 mmHg] 1 mmHg      Intake/Output Summary (Last 24 hours) at 12/30/2023 0805 Last data filed at 12/30/2023 0700 Gross per 24 hour  Intake 1054.13 ml  Output 1275 ml  Net -220.87 ml   Filed Weights   12/14/23 0500 12/19/23 0500 12/29/23 1215  Weight: 61.7 kg 62.7 kg 40.1 kg    Examination: General: Comfortable HENT: Oropharynx clear, moist, no secretions, no stridor Lungs: Decreased to both bases,  otherwise clear Cardiovascular: Regular, borderline tachycardic, holosystolic murmur Abdomen: Nondistended, positive bowel sounds Extremities: Right BKA looks good, no purulence, well-healed.  No edema on the left Neuro: He is awake and oriented, interacts appropriately.  Follows commands GU: Blood-tinged urine  Resolved problem list   Assessment and Plan   Cardiogenic and septic shock with lactic acidosis Has known history of heart failure with reduced EF, typically in the 20-25 range now down to about 13%.  Suspect source of sepsis is his chronic Foley especially with onset of new hematuria, but now biventricular heart failure a significant contributing factor Plan - Beta-blockade held - Dobutamine  2.5, titrating norepinephrine  for MAP 65 and following oximetry (55%) - CVP 6, suspect euvolemic.  May be a small amount of room to give volume, goal CVP ~10.  Would defer diuresis for now although he may require depending on fluid shifts - Follow blood cultures - Continue broad-spectrum antibiotics linezolid  and Zosyn  - Lactic acid has cleared as of p.m. 8/29 - Appreciate advanced heart failure team consultation - Remains on statin, ASA held  Known LV thrombus Was on Eliquis  Plan -Eliquis  held due to his acute hematuria - Question whether we can start heparin  without a bolus.  Acute on chronic renal failure.  Baseline creatinine felt to be in the mid 2-3 range, with CKD stage IIIb at baseline History of urinary retention and prostate cancer with chronic Foley catheter. His underlying CKD felt to be due to monoclonal  gammopathy  Plan -Maintain euvolemia, CVP currently 6, could allow it to drift up to 10 in the setting of sepsis - MAP 65 - Follow urine output, BMP closely.  May require HD depending on trend - Chronic hydro noted on CT abdomen, evidence for cystitis and left pyelonephritis.  If hematuria persists then will consult with urology  Hematuria.  Apparently this has been on  and off through the hospitalization.  Noted once again earlier this morning, a little worse after coud catheter replaced on 8/29 Plan -Eliquis  held, consider initiation IV heparin  - Daily catheters in place - Following CBC - Follow for clearance of his hematuria  Acute metabolic encephalopathy.  Suspect multifactorial secondary to a) Sepsis B) uremia c) possibly symptomatic hyponatremia also consider postictal state Rapid response nurse did report some abnormal motor activity when she first arrived  Plan -Supportive care and work to reverse underlying contributors - Hold any sedating medications - Follow sodium closely, remains 121.  Strongly consider hypertonic saline - EEG on 8/29 did not show any evidence of seizure activity   Acute on chronic hyponatremia. We had been pushing fluid intake over the last 48 hours.  Sudden drop of sodium following these measures.  He does not appear volume depleted at this point if anything potentially volume up. He is not obtunded Plan - Hold off on crystalloid administration given trend - Supportive hemodynamics - consider hypertonic saline but he is not obtunded. If his Na drops further then will initiate   Hyperkalemia Plan Following BMP  Chronic anemia Looks like baseline hanging around 8 With new onset of hematuria and Co. 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  Completed on 7/18.  Currently receiving physical therapy for significant deconditioning Plan -PT/OT as he can tolerate  Elevated protein gap with monoclonal gammopathy and suspected multiple myeloma from biopsy on 7/17 Plan -Supportive  care - Note potential impact on his osmolality, sodium  CODE STATUS Plan -Full code   Labs   CBC: Recent Labs  Lab 12/25/23 1101 12/28/23 0531 12/29/23 0926 12/29/23 1323 12/29/23 1620 12/30/23 0332  WBC 5.5 5.7 18.0* 22.5*  --  17.9*  NEUTROABS  --   --  16.7*  --   --   --   HGB 8.0* 8.2* 8.0* 6.7* 7.5* 7.3*  HCT 25.6* 25.4* 24.5* 20.7* 22.0* 22.3*  MCV 86.2 84.9 84.2 84.8  --  84.5  PLT 330 329 292 308  --  250    Basic Metabolic Panel: Recent Labs  Lab 12/25/23 1101 12/26/23 0518 12/27/23 0439 12/28/23 0531 12/29/23 0422 12/29/23 1200 12/29/23 1620 12/29/23 2135 12/30/23 0332  NA 126* 127* 126* 130* 121* 122* 121* 121* 121*  K 5.2* 5.8* 5.3* 4.8 5.1 5.2* 6.2* 5.3* 5.0  CL 91* 91* 87* 91* 84* 87*  --  88* 87*  CO2 27 28 26 27  20* 21*  --  21* 21*  GLUCOSE 224* 120* 108* 102* 183* 159*  --  185* 156*  BUN 88* 91* 109* 116* 126* 123*  --  125* 128*  CREATININE 3.57* 3.80* 3.90* 3.48* 3.96* 4.07*  --  4.50* 4.68*  CALCIUM  9.0 8.9 8.9 9.2 8.8* 8.0*  --  7.7* 8.0*  PHOS 4.3 4.9* 4.4 4.7* 3.5  --   --   --   --    GFR: Estimated Creatinine Clearance: 9 mL/min (A) (by C-G formula based on SCr of 4.68 mg/dL (H)). Recent Labs  Lab 12/28/23 0531 12/29/23 0926 12/29/23 1033 12/29/23 1231 12/29/23 1323 12/29/23 1549 12/29/23 2135 12/30/23 0332  PROCALCITON  --   --   --  0.86  --   --   --   --   WBC 5.7 18.0*  --   --  22.5*  --   --  17.9*  LATICACIDVEN  --  2.7* 3.5*  --   --  6.6* 1.7  --     Liver Function Tests: Recent Labs  Lab 12/26/23 0518 12/27/23 0439 12/28/23 0531 12/29/23 0422 12/29/23 0926  AST  --   --   --   --  28  ALT  --   --   --   --  23  ALKPHOS  --   --   --   --  51  BILITOT  --   --   --   --  0.8  PROT  --   --   --   --  8.3*  ALBUMIN 1.9* 2.1* 2.2* 2.3* 2.3*   No results for input(s): LIPASE, AMYLASE in the last 168 hours. No results for input(s): AMMONIA in the last 168 hours.  ABG    Component Value  Date/Time   PHART 7.488 (H) 12/29/2023 1620   PCO2ART 20.9 (L) 12/29/2023 1620   PO2ART 98 12/29/2023 1620   HCO3 15.9 (L) 12/29/2023 1620   TCO2 17 (L) 12/29/2023 1620   ACIDBASEDEF 7.0 (H) 12/29/2023 1620   O2SAT 54.8 12/30/2023 0332     Coagulation Profile: No results for input(s): INR, PROTIME in the last 168 hours.  Cardiac Enzymes: Recent Labs  Lab 12/29/23 1200  CKTOTAL 257  CKMB 27.4*    HbA1C: Hgb A1c MFr Bld  Date/Time Value Ref Range Status  09/02/2023 06:15 AM 8.7 (H) 4.8 - 5.6 % Final    Comment:    (NOTE) Pre diabetes:          5.7%-6.4%  Diabetes:              >6.4%  Glycemic control for   <7.0% adults with diabetes   06/13/2023 07:40 PM 13.3 (H) 4.8 - 5.6 % Final    Comment:    (NOTE) Pre diabetes:          5.7%-6.4%  Diabetes:              >6.4%  Glycemic control for   <7.0% adults with diabetes     CBG: Recent Labs  Lab 12/29/23 1508 12/29/23 2102 12/29/23 2315 12/30/23 0319 12/30/23 0717  GLUCAP 167* 181* 161* 164* 103*     Critical care time: 33 minutes     Lamar Chris, MD, PhD 12/30/2023, 8:05 AM  Pulmonary and Critical Care [phone] or if no answer before 7:00PM call [phone] For any issues after 7:00PM please call eLink [phone]

## 2023-12-30 NOTE — Plan of Care (Signed)
  Problem: Activity: Goal: Risk for activity intolerance will decrease Outcome: Progressing   Problem: Education: Goal: Individualized Educational Video(s) Outcome: Progressing   Problem: Coping: Goal: Ability to adjust to condition or change in health will improve Outcome: Progressing   Problem: Fluid Volume: Goal: Ability to maintain a balanced intake and output will improve Outcome: Progressing   Problem: Health Behavior/Discharge Planning: Goal: Ability to identify and utilize available resources and services will improve Outcome: Progressing Goal: Ability to manage health-related needs will improve Outcome: Progressing   Problem: Metabolic: Goal: Ability to maintain appropriate glucose levels will improve Outcome: Progressing   Problem: Nutritional: Goal: Maintenance of adequate nutrition will improve Outcome: Progressing Goal: Progress toward achieving an optimal weight will improve Outcome: Progressing   Problem: Tissue Perfusion: Goal: Adequacy of tissue perfusion will improve Outcome: Progressing

## 2023-12-30 NOTE — Progress Notes (Signed)
 PHARMACY - ANTICOAGULATION CONSULT NOTE  Pharmacy Consult:  Heparin  Indication: LV thrombus  Allergies  Allergen Reactions   Cefepime  Other (See Comments)    Cefepime  neurotoxicity 11/07/23    Patient Measurements: Height: 6' (182.9 cm) Weight: 42.1 kg (92 lb 13 oz) IBW/kg (Calculated) : 77.6 HEPARIN  DW (KG): 42.1  Vital Signs: Temp: 99.5 F (37.5 C) (08/30 1121) Temp Source: Oral (08/30 1121) BP: 94/60 (08/30 1230) Pulse Rate: 102 (08/30 1230)  Labs: Recent Labs    12/29/23 1200 12/29/23 1323 12/29/23 1620 12/29/23 2135 12/30/23 0332 12/30/23 1011  HGB  --  6.7* 7.5*  --  7.3* 7.4*  HCT  --  20.7* 22.0*  --  22.3* 22.6*  PLT  --  308  --   --  250 237  CREATININE 4.07*  --   --  4.50* 4.68* 4.81*  CKTOTAL 257  --   --   --   --   --   CKMB 27.4*  --   --   --   --   --     Estimated Creatinine Clearance: 8.8 mL/min (A) (by C-G formula based on SCr of 4.81 mg/dL (H)).   Assessment: 61 YOM with  LV thrombus off Eliquis  due to hematuria, last dose 8/29 AM.  CBC stable and Pharmacy consulted to dose IV heparin .  CT also shows new polypoid thrombus and multiple splenic infarcts.  Hgb 7.4, plts WNL, worsening renal function.  Confirmed weight of ~42 kg.  Goal of Therapy:  Heparin  level 0.3-0.5 units/ml d/t bleeding risk Monitor platelets by anticoagulation protocol: Yes   Plan:  No bolus per MD Heparin  gtt at 550 units/hr Check 8 hr heparin  level Daily heparin  level and CBC Monitor closely for bleeding/bleeding resolution   Cy Bresee D. Lendell, PharmD, BCPS, BCCCP 12/30/2023, 1:43 PM

## 2023-12-31 DIAGNOSIS — R579 Shock, unspecified: Secondary | ICD-10-CM | POA: Diagnosis not present

## 2023-12-31 DIAGNOSIS — N179 Acute kidney failure, unspecified: Secondary | ICD-10-CM | POA: Diagnosis not present

## 2023-12-31 DIAGNOSIS — E871 Hypo-osmolality and hyponatremia: Secondary | ICD-10-CM | POA: Diagnosis not present

## 2023-12-31 DIAGNOSIS — A419 Sepsis, unspecified organism: Secondary | ICD-10-CM | POA: Diagnosis not present

## 2023-12-31 DIAGNOSIS — R570 Cardiogenic shock: Secondary | ICD-10-CM | POA: Diagnosis not present

## 2023-12-31 DIAGNOSIS — D631 Anemia in chronic kidney disease: Secondary | ICD-10-CM

## 2023-12-31 LAB — BASIC METABOLIC PANEL WITH GFR
Anion gap: 12 (ref 5–15)
BUN: 127 mg/dL — ABNORMAL HIGH (ref 8–23)
CO2: 21 mmol/L — ABNORMAL LOW (ref 22–32)
Calcium: 8.1 mg/dL — ABNORMAL LOW (ref 8.9–10.3)
Chloride: 90 mmol/L — ABNORMAL LOW (ref 98–111)
Creatinine, Ser: 5.03 mg/dL — ABNORMAL HIGH (ref 0.61–1.24)
GFR, Estimated: 12 mL/min — ABNORMAL LOW (ref >60)
Glucose, Bld: 88 mg/dL (ref 70–99)
Potassium: 4.1 mmol/L (ref 3.5–5.1)
Sodium: 123 mmol/L — ABNORMAL LOW (ref 135–145)

## 2023-12-31 LAB — CBC
HCT: 20.7 % — ABNORMAL LOW (ref 39.0–52.0)
HCT: 19.2 % — ABNORMAL LOW (ref 39.0–52.0)
Hemoglobin: 6.8 g/dL — CL (ref 13.0–17.0)
Hemoglobin: 6.2 g/dL — CL (ref 13.0–17.0)
MCH: 27.9 pg (ref 26.0–34.0)
MCH: 28.4 pg (ref 26.0–34.0)
MCHC: 32.9 g/dL (ref 30.0–36.0)
MCHC: 32.3 g/dL (ref 30.0–36.0)
MCV: 84.8 fL (ref 80.0–100.0)
MCV: 88.1 fL (ref 80.0–100.0)
Platelets: 226 K/uL (ref 150–400)
Platelets: 177 K/uL (ref 150–400)
RBC: 2.44 MIL/uL — ABNORMAL LOW (ref 4.22–5.81)
RBC: 2.18 MIL/uL — ABNORMAL LOW (ref 4.22–5.81)
RDW: 17.8 % — ABNORMAL HIGH (ref 11.5–15.5)
RDW: 17.4 % — ABNORMAL HIGH (ref 11.5–15.5)
WBC: 9.6 K/uL (ref 4.0–10.5)
WBC: 6.1 K/uL (ref 4.0–10.5)
nRBC: 0 % (ref 0.0–0.2)
nRBC: 0 % (ref 0.0–0.2)

## 2023-12-31 LAB — COOXEMETRY PANEL
Carboxyhemoglobin: 1.5 % (ref 0.5–1.5)
Methemoglobin: <0.7 % (ref 0.0–1.5)
O2 Saturation: 62.1 %
Total hemoglobin: 9.2 g/dL — ABNORMAL LOW (ref 12.0–16.0)

## 2023-12-31 LAB — GLUCOSE, CAPILLARY
Glucose-Capillary: 153 mg/dL — ABNORMAL HIGH (ref 70–99)
Glucose-Capillary: 160 mg/dL — ABNORMAL HIGH (ref 70–99)
Glucose-Capillary: 161 mg/dL — ABNORMAL HIGH (ref 70–99)
Glucose-Capillary: 164 mg/dL — ABNORMAL HIGH (ref 70–99)
Glucose-Capillary: 87 mg/dL (ref 70–99)
Glucose-Capillary: 90 mg/dL (ref 70–99)
Glucose-Capillary: 94 mg/dL (ref 70–99)

## 2023-12-31 LAB — PREPARE RBC (CROSSMATCH)

## 2023-12-31 LAB — APTT: aPTT: 46 s — ABNORMAL HIGH (ref 24–36)

## 2023-12-31 MED ORDER — METOPROLOL TARTRATE 5 MG/5ML IV SOLN: 5.0000 mg | INTRAVENOUS | Status: AC

## 2023-12-31 MED ORDER — SODIUM CHLORIDE 0.9% IV SOLUTION: Freq: Once | INTRAVENOUS | Status: AC

## 2023-12-31 MED ORDER — METOPROLOL TARTRATE 5 MG/5ML IV SOLN
INTRAVENOUS | Status: AC
  Administered 2023-12-31: 5 mg via INTRAVENOUS
  Filled 2023-12-31: qty 5

## 2023-12-31 NOTE — Progress Notes (Signed)
 eLink Physician-Brief Progress Note Patient Name: Derek Thompson DOB: 1960-05-25 MRN: [id number]   Date of Service  12/31/2023  HPI/Events of Note  HGB 6.2, received 1 unit PRBC this morning, known ongoing hematuria.  Needs systemic anticoagulation for LV thrombus-currently held Combined vasodilatory versus cardiogenic shock  eICU Interventions  Transfuse 1 unit PRBC     Intervention Category Intermediate Interventions: Bleeding - evaluation and treatment with blood products  Lucine Bilski 12/31/2023, 7:26 PM

## 2023-12-31 NOTE — Progress Notes (Incomplete)
 Pt found to be tachycardia(160)

## 2023-12-31 NOTE — Progress Notes (Signed)
 Patient ID: Derek Thompson, male   DOB: [birth year], 63 y.o.   MRN: [id number]     Advanced Heart Failure Rounding Note  Cardiologist: Vinie JAYSON Maxcy, MD  Chief Complaint: Shock Subjective:    No complaints this morning, no dyspnea.  Awake/alert.   Continues dobutamine  2.5, NE 4.  Last co-ox 68% yesterday, CVP 9.  MAP stable. NSR 90s.   BUN/creatinine remain elevated but stable, 127/5.03, Na 121 => 123.    WBCs 17.9 => 9.6, Afebrile. He is on linezolid /Zosyn . Suspect urinary source for septic shock.   Still with hematuria, hgb 6.8.  Back on heparin  gtt.     Objective:   Weight Range: 42.1 kg Body mass index is 12.59 kg/m.   Vital Signs:   Temp:  [97.8 F (36.6 C)-99.5 F (37.5 C)] 97.9 F (36.6 C) (08/31 0952) Pulse Rate:  [96-113] 98 (08/31 1045) Resp:  [8-25] 17 (08/31 1045) BP: (86-123)/(53-87) 123/87 (08/31 1045) SpO2:  [93 %-100 %] 100 % (08/31 1045) Weight:  [42.1 kg] 42.1 kg (08/30 1335) Last BM Date : 12/30/23  Weight change: Filed Weights   12/19/23 0500 12/29/23 1215 12/30/23 1335  Weight: 62.7 kg 40.1 kg 42.1 kg    Intake/Output:   Intake/Output Summary (Last 24 hours) at 12/31/2023 1102 Last data filed at 12/31/2023 0953 Gross per 24 hour  Intake 1278.33 ml  Output 1225 ml  Net 53.33 ml      Physical Exam    General: NAD Neck: No JVD, no thyromegaly or thyroid nodule.  Lungs: Clear to auscultation bilaterally with normal respiratory effort. CV: Lateral PMI.  Heart regular S1/S2, no S3/S4, no murmur.  No peripheral edema.   Abdomen: Soft, nontender, no hepatosplenomegaly, no distention.  Skin: Intact without lesions or rashes.  Neurologic: Alert and oriented x 3.  Psych: Normal affect. Extremities: No clubbing or cyanosis. Right BKA.  HEENT: Normal.  GU: Hematuria  Telemetry   NSR 90s (personally reviewed)  Labs    CBC Recent Labs    12/29/23 0926 12/29/23 1323 12/30/23 1601 12/31/23 0400  WBC 18.0*   < > 14.4* 9.6   NEUTROABS 16.7*  --   --   --   HGB 8.0*   < > 7.1* 6.8*  HCT 24.5*   < > 21.3* 20.7*  MCV 84.2   < > 84.2 84.8  PLT 292   < > 230 226   < > = values in this interval not displayed.   Basic Metabolic Panel Recent Labs    91/70/74 0422 12/29/23 1200 12/30/23 2009 12/31/23 0400  NA 121*   < > 120* 123*  K 5.1   < > 4.3 4.1  CL 84*   < > 87* 90*  CO2 20*   < > 22 21*  GLUCOSE 183*   < > 124* 88  BUN 126*   < > 130* 127*  CREATININE 3.96*   < > 5.00* 5.03*  CALCIUM  8.8*   < > 8.0* 8.1*  PHOS 3.5  --   --   --    < > = values in this interval not displayed.   Liver Function Tests Recent Labs    12/29/23 0422 12/29/23 0926  AST  --  28  ALT  --  23  ALKPHOS  --  51  BILITOT  --  0.8  PROT  --  8.3*  ALBUMIN 2.3* 2.3*   No results for input(s): LIPASE, AMYLASE in the last 72 hours. Cardiac Enzymes  Recent Labs    12/29/23 1200  CKTOTAL 257  CKMB 27.4*    BNP: BNP (last 3 results) Recent Labs    06/29/23 0447 11/02/23 1552 11/03/23 0824  BNP 3,373.6* 4,136.5* 3,980.4*    ProBNP (last 3 results) No results for input(s): PROBNP in the last 8760 hours.   D-Dimer No results for input(s): DDIMER in the last 72 hours. Hemoglobin A1C No results for input(s): HGBA1C in the last 72 hours. Fasting Lipid Panel No results for input(s): CHOL, HDL, LDLCALC, TRIG, CHOLHDL, LDLDIRECT in the last 72 hours. Thyroid Function Tests No results for input(s): TSH, T4TOTAL, T3FREE, THYROIDAB in the last 72 hours.  Invalid input(s): FREET3  Other results:   Imaging    No results found.    Medications:     Scheduled Medications:  sodium chloride    Intravenous Once   atorvastatin   80 mg Oral Daily   Chlorhexidine  Gluconate Cloth  6 each Topical Daily   ezetimibe   10 mg Oral Daily   feeding supplement  237 mL Oral BID BM   insulin  aspart  0-9 Units Subcutaneous Q4H   insulin  glargine  5 Units Subcutaneous Daily   lidocaine   1  Application Urethral Once   mupirocin  ointment   Nasal BID    Infusions:  DOBUTamine  2.5 mcg/kg/min (12/31/23 0600)   heparin  650 Units/hr (12/31/23 0600)   linezolid  (ZYVOX ) IV 600 mg (12/31/23 0933)   norepinephrine  (LEVOPHED ) Adult infusion 8 mcg/min (12/31/23 0600)   piperacillin -tazobactam (ZOSYN )  IV 2.25 g (12/31/23 0535)    PRN Medications: acetaminophen , mouth rinse, polyethylene glycol    Assessment/Plan   1. Shock: Suspect mixed septic/cardiogenic.  Echo with EF < 20%, LV thrombus, moderate RV dysfunction.  Now on NE 4 + dobutamine  2.5 with last co-ox yesterday 68%.  CVP 9 on my read.    - Will get co-ox today.  - Not markedly volume overloaded and BUN/creatinine very high, no Lasix  at this time.  Will probably need some diuretic tomorrow.    - Continue dobutamine  2.5.  - Wean NE as tolerated, MAP stable and this is trending down.  - Broad spectrum abx with linezolid /Zosyn .  2. Acute on chronic systolic CHF: Echo with EF < 20%, LV thrombus, moderate RV dysfunction.  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  Last cath in 6/24 showed 70% proximal LCx stenosis. Now with shock, see plan above.  3.  Gross hematuria: Hgb 6.8, to get 1 unit PRBCs.  Suspect due to chronic foley + UTI.  - Continue heparin  for now with LV thrombus.   4. 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   - Heparin  restarted yesterday.    5. Multiple myeloma: New diagnosis this admission.  6. 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 7. ID: Suspect septic shock as above, probably from UTI/urosepsis.  - Continue linezolid /Zosyn .  8. AKI on CKD stage 3: Baseline creatinine 1.5. BUN/creatinine stabilized today at 127/5.  Suspect due to hypotension/shock.  - Follow closely, may require RRT but no urgent need at this time.  9. Hyponatremia: Na stable at 121 => 123.  He is mentally clear.   - Fluid restrict  - With AKI and CVP not significantly elevated, would avoid tolvaptan.  -  Would hold off on 3% saline unless sodium goes < 120 given lack of confusion or evidence for symptomatic hyponatremia.   Discussed with Dr. Shelah.   CRITICAL CARE Performed by: Ezra Shuck  Total critical care time: 40 minutes  Critical care time was exclusive of separately billable procedures and treating other patients.  Critical care was necessary to treat or prevent imminent or life-threatening deterioration.  Critical care was time spent personally by me on the following activities: development of treatment plan with patient and/or surrogate as well as nursing, discussions with consultants, evaluation of patient's response to treatment, examination of patient, obtaining history from patient or surrogate, ordering and performing treatments and interventions, ordering and review of laboratory studies, ordering and review of radiographic studies, pulse oximetry and re-evaluation of patient's condition.    Length of Stay: 62  Ezra Shuck, MD  12/31/2023, 11:02 AM  Advanced Heart Failure Team Pager [phone] (M-F; 7a - 5p)  Please contact [facility] for night-coverage after hours (5p -7a ) and weekends on [email or web address]

## 2023-12-31 NOTE — Progress Notes (Signed)
 PHARMACY - ANTICOAGULATION CONSULT NOTE  Pharmacy Consult for heparin  Indication: LV thrombus and splenic infarcts  Labs: Recent Labs    12/29/23 1200 12/29/23 1323 12/30/23 0332 12/30/23 1011 12/30/23 1601 12/30/23 2009 12/30/23 2241  HGB  --    < > 7.3* 7.4* 7.1*  --   --   HCT  --    < > 22.3* 22.6* 21.3*  --   --   PLT  --    < > 250 237 230  --   --   APTT  --   --   --   --   --   --  50*  HEPARINUNFRC  --   --   --   --   --   --  >1.10*  CREATININE 4.07*   < > 4.68* 4.81*  --  5.00*  --   CKTOTAL 257  --   --   --   --   --   --   CKMB 27.4*  --   --   --   --   --   --    < > = values in this interval not displayed.   Assessment: 63yo male subtherapeutic on heparin  with initial dosing while DOAC on hold; no infusion issues per RN but she does note that urine is red, though this was present prior to starting heparin  infusion.  Goal of Therapy:  aPTT 66-85 seconds   Plan:  Increase heparin  infusion by 2-3 units/kg/hr to 650 units/hr. Check PTT in 8 hours.   Marvetta Dauphin, PharmD, BCPS 12/31/2023 1:01 AM

## 2023-12-31 NOTE — Progress Notes (Signed)
 PHARMACY - ANTICOAGULATION CONSULT NOTE  Pharmacy Consult:  Heparin  Indication: LV thrombus  Allergies  Allergen Reactions   Cefepime  Other (See Comments)    Cefepime  neurotoxicity 11/07/23    Patient Measurements: Height: 6' (182.9 cm) Weight: 42.1 kg (92 lb 13 oz) IBW/kg (Calculated) : 77.6 HEPARIN  DW (KG): 42.1  Vital Signs: Temp: 97.9 F (36.6 C) (08/31 0952) Temp Source: Axillary (08/31 0952) BP: 123/87 (08/31 1045) Pulse Rate: 98 (08/31 1045)  Labs: Recent Labs    12/29/23 1200 12/29/23 1323 12/30/23 1011 12/30/23 1601 12/30/23 2009 12/30/23 2241 12/31/23 0400 12/31/23 1200  HGB  --    < > 7.4* 7.1*  --   --  6.8*  --   HCT  --    < > 22.6* 21.3*  --   --  20.7*  --   PLT  --    < > 237 230  --   --  226  --   APTT  --   --   --   --   --  50*  --  46*  HEPARINUNFRC  --   --   --   --   --  >1.10*  --   --   CREATININE 4.07*   < > 4.81*  --  5.00*  --  5.03*  --   CKTOTAL 257  --   --   --   --   --   --   --   CKMB 27.4*  --   --   --   --   --   --   --    < > = values in this interval not displayed.    Estimated Creatinine Clearance: 8.8 mL/min (A) (by C-G formula based on SCr of 5.03 mg/dL (H)).   Assessment: 50 YOM with  LV thrombus off Eliquis  due to hematuria, last dose 8/29 AM.  CBC stable and Pharmacy consulted to dose IV heparin .  CT also shows new polypoid thrombus and multiple splenic infarcts.  Confirmed weight of ~42 kg.   aPTT sub-therapeutic at 46 sec.  Hemoglobin trended down and patient is s/p transfusion.  Hematuria stable.  Goal of Therapy:  Heparin  level 0.3-0.5 units/ml aPTT 66-85 seconds d/t bleeding risk Monitor platelets by anticoagulation protocol: Yes   Plan:  No bolus per MD Increase heparin  gtt to 750 units/hr Check 8 hr aPTT Daily heparin  level, aPTT and CBC Monitor for hematuria resolution  Arsalan Brisbin D. Lendell, PharmD, BCPS, BCCCP 12/31/2023, 1:29 PM

## 2023-12-31 NOTE — Progress Notes (Signed)
 NAME:  Derek Thompson, MRN:  [id number], DOB:  12-22-1960, LOS: 58 ADMISSION DATE:  11/02/2023, CONSULTATION DATE:  829 REFERRING MD:  Shawn, CHIEF COMPLAINT:  shock   History of Present Illness:  63 year old male resided at homeless shelter prior to admission presented initially back on 7/4 with acute mental status change, lethargy, hyponatremia, AKI, and hyperkalemia.  Was tachycardic on initial evaluation, and treated volume resuscitation which resulted in improved sodium up to 126, he had significant urinary retention and a Foley catheter was placed, and he was empirically started on antibiotics for presumed urinary tract infection .  His hospital course has been prolonged, clinically had improved over the first 2 weeks of therapy.  Orthopedic surgery was consulted, who also recommended BKA of the right extremity.  Early in the hospital admission was noted to have a fairly significant protein gap with a serum protein level of 7.6 and an albumin of 2.6 triggering further investigation yielding abnormal proteins SPEP with gammaglobulin 2.1 and M spike of 1.1 for which oncology was consulted.  Ultimately underwent bone marrow biopsy with findings consistent with multiple myeloma, plan was to follow-up with oncology.  He underwent BKA on 7/18. Overall it appears as though his hospital course had been improving and he had actually been cleared for hospital discharge in early August, he had been working with hospital physical therapy, and enrolled in the [place] program. On 8/25 chart review reports patient reporting some nausea,  Still working with physical therapy.  Last Foley catheter changed on 8/8 with coud 8/26 poor appetite 8/27 function a little worse, baseline felt to be somewhere in the range of 2-3 rising up to 3.9 with BUN up to 109 Farxiga  was stopped got some Lokelma  for hyperkalemia.  Clinically appeared volume depleted encouraged to increase oral intake, and administered crystalloid.   Renal function improved a little bit down to 3.48 8/28 held off on further hydration.  The hyponatremia and renal dysfunction was also felt to be at least in part due to the amount of clonal gammopathy with pseudohyponatremia. On 8/29 patient's sodium noted to be down to 121, at that time his heart rate was 121 blood pressure 99/66 however bedside evaluation found him to be awake oriented without any focal deficits no headaches or dizziness.  Later in the morning around 7:30 AM the patient was found confused and febrile.  He was administered Tylenol , 1 L of saline was administered, blood cultures sent, and started on broad-spectrum antibiotics.  Later that morning around 1036 heart rate up in the 140s, new hematuria (this has been a intermittent problem during the hospitalization) patient more confused, blood pressure in the 60s systolically still receiving the crystalloid previously ordered stat echocardiogram obtained, rapid response called, critical care asked to evaluate at bedside.  Stat echocardiogram result showed LVEF less than 20% with a large amount of soft thrombus/slow flow at the LV apex there is moderate RV systolic dysfunction with concern for cardiogenic shock.  He was moved to the intensive care, initially there was plan for CT abdomen pelvis to rule out possible sepsis source superimposed on cardiogenic shock in the context of his delirium, fever, and hematuria however there was insufficient IV access to obtain that.  His care was assumed by critical care services as of 8/29 with a working diagnosis of undifferentiated shock, acute on chronic renal failure, acute on chronic hyponatremia, and acute metabolic encephalopathy.   Pertinent  Medical History  Heart failure with reduced EF, baseline in the  20 range, urinary retention secondary to history of prostate cancer with chronic Foley, chronic right lower extremity osteomyelitis, CKD stage IIIb.  Significant Hospital Events: Including  procedures, antibiotic start and stop dates in addition to other pertinent events   Admitted 7/4 Bone marrow biopsy 7/17 7/18 right BKA 8/27 through 8/28 rising serum creatinine and BUN 8/29 new fever, hematuria, acute mental status change, acute hyponatremia.  New shock.  Required norepinephrine . Stat echocardiogram result showed LVEF less than 20% with a large amount of soft thrombus/slow flow at the LV apex there is moderate RV systolic dysfunction with concern for cardiogenic shock.  Central line placed, Co. oximetry obtained with SpO2 in the 40s continued on norepinephrine , checking stat CBC needs to be above 7, if CBC dropping further may need to consider transfusion 8/29 CT abdomen and pelvis > LV thrombus noted, multiple splenic infarcts, moderate bilateral hydronephrosis and hydroureter with urothelial enhancement consistent with infectious cystitis and probable left pyelonephritis.  Gallbladder wall thickening, consistent with gallbladder wall edema.  Mild ascites.  Interim History / Subjective:  - SVT this morning when he has been turned to be cleaned, responded to metoprolol  5 mg - Remains on dobutamine  2.5, norepinephrine  8 - On room air - Heparin  reinitiated 8/30, hemoglobin 6.8 with some hematuria present.  1 unit PRBC ordered -Urine output 1575 cc / 24 hours.  Serum creatinine plateauing at 5.0. - CVP pending, Co. oximetry 8/30 at goal 68%  Chest x-ray 8/30 reviewed > very mild interstitial prominence, mostly clear  Objective    Blood pressure (!) 86/70, pulse 98, temperature 98.3 F (36.8 C), temperature source Axillary, resp. rate 20, height 6' (1.829 m), weight 42.1 kg, SpO2 100%. CVP:  [2 mmHg-5 mmHg] 2 mmHg      Intake/Output Summary (Last 24 hours) at 12/31/2023 0842 Last data filed at 12/31/2023 0600 Gross per 24 hour  Intake 1087.73 ml  Output 1575 ml  Net -487.27 ml   Filed Weights   12/19/23 0500 12/29/23 1215 12/30/23 1335  Weight: 62.7 kg 40.1 kg 42.1 kg     Examination: General: Thin, comfortable ill-appearing man HENT: Oropharynx clear, strong voice, no stridor, no secretions Lungs: Few scattered bilateral crackles, decreased to both bases, otherwise clear Cardiovascular: Regular, heart rate 90s, holosystolic murmur Abdomen: Nondistended with positive bowel sounds Extremities: No edema.  Right BKA Neuro: Awake and oriented, follows commands and interacts appropriately GU: Blood-tinged urine in Foley catheter bag  Resolved problem list   Assessment and Plan   Cardiogenic and septic shock with lactic acidosis Has known history of heart failure with reduced EF, typically in the 20-25 range now down to about 13%.  Suspect source of sepsis is his chronic Foley especially with onset of new hematuria, but now biventricular heart failure a significant contributing factor Plan - Beta-blockade held - Appears to be euvolemic, CVP at goal 8/30, recheck today.  Would hold off on diuresis given mixed shock picture, goal CVP~10 - Dobutamine  2.5, norepinephrine  8 with improvement in co-oximetry to 68% and stabilization of renal function.  Will continue same. -Continue his broad-spectrum antibiotics: Zosyn , plan to stop linezolid  after dosed 8/31 as we believe this is urinary source - Follow culture data and tailor antibiotics appropriately - Appreciate advanced heart failure team assistance - Remains on statin, ASA held with hematuria but would like to restart soon  Known LV thrombus Was on Eliquis  Plan - Eliquis  held, restarted heparin  on 8/30 with some persistent hematuria and now requiring transfusion 8/31.  Hopefully  will be able to continue.  Follow CBC and hematuria to determine  Acute on chronic renal failure.  Baseline creatinine felt to be in the mid 2-3 range, with CKD stage IIIb at baseline History of urinary retention and prostate cancer with chronic Foley catheter. 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  Hopefully this means he will be able to avoid hemodialysis.  Hold off on HD catheter placement for now - Maintain adequate renal perfusion with treatment for his sepsis and management of his acute on chronic systolic dysfunction  Hematuria.  Apparently this has been on and off through the hospitalization.  Was slightly better off anticoagulation, now present again Plan - Goal continue IV heparin  - 1 unit PRBC given 8/31, follow CBC - Coud catheter is in place  Acute metabolic encephalopathy.  Suspect multifactorial secondary to a) Sepsis B) uremia c) possibly symptomatic hyponatremia also consider postictal state Rapid response nurse did report some abnormal motor activity when she first arrived  Plan -Supportive care were to reverse underlying contributors - Hold any sedating medications - Following sodium as below - EEG was reassuring  Acute on chronic hyponatremia. We had been pushing fluid intake over the last 48 hours.  Sudden drop of sodium following these measures.  He does not appear volume depleted at this point if anything potentially volume up. He is not obtunded Plan - Holding off on crystalloid administration and following sodium, beginning to drift back up on 8/31 - Support hemodynamics with dobutamine /norepi without volume resuscitation - Would consider hypertonic saline if he becomes obtunded or if his sodium drops below 120  Hyperkalemia, resolved Plan -Continue to follow BMP  Acute on chronic anemia, component of acute blood loss from hematuria Plan - Continue heparin  and follow CBC closely  Type 2 diabetes with hyperglycemia Plan -Sliding scale insulin  as ordered - Lantus   Hypoalbuminemia, and protein calorie malnutrition Plan -Supportive care, nutritional support  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  Completed on 7/18.  Currently receiving physical therapy  for significant deconditioning Plan -PT/OT as he can tolerate  Elevated protein gap with monoclonal gammopathy and suspected multiple myeloma from biopsy on 7/17 Plan -Supportive care  - Note potential impact on his osmolality, sodium  CODE STATUS Plan -Full code   Labs   CBC: Recent Labs  Lab 12/29/23 0926 12/29/23 1323 12/29/23 1620 12/30/23 0332 12/30/23 1011 12/30/23 1601 12/31/23 0400  WBC 18.0* 22.5*  --  17.9* 14.9* 14.4* 9.6  NEUTROABS 16.7*  --   --   --   --   --   --   HGB 8.0* 6.7* 7.5* 7.3* 7.4* 7.1* 6.8*  HCT 24.5* 20.7* 22.0* 22.3* 22.6* 21.3* 20.7*  MCV 84.2 84.8  --  84.5 84.6 84.2 84.8  PLT 292 308  --  250 237 230 226    Basic Metabolic Panel: Recent Labs  Lab 12/25/23 1101 12/26/23 0518 12/27/23 0439 12/28/23 0531 12/29/23 0422 12/29/23 1200 12/29/23 2135 12/30/23 0332 12/30/23 1011 12/30/23 2009 12/31/23 0400  NA 126* 127* 126* 130* 121*   < > 121* 121* 122* 120* 123*  K 5.2* 5.8* 5.3* 4.8 5.1   < > 5.3* 5.0 4.5 4.3 4.1  CL 91* 91* 87* 91* 84*   < > 88* 87* 87* 87* 90*  CO2 27 28 26 27  20*   < > 21* 21*  21* 22 21*  GLUCOSE 224* 120* 108* 102* 183*   < > 185* 156* 116* 124* 88  BUN 88* 91* 109* 116* 126*   < > 125* 128* 127* 130* 127*  CREATININE 3.57* 3.80* 3.90* 3.48* 3.96*   < > 4.50* 4.68* 4.81* 5.00* 5.03*  CALCIUM  9.0 8.9 8.9 9.2 8.8*   < > 7.7* 8.0* 8.2* 8.0* 8.1*  PHOS 4.3 4.9* 4.4 4.7* 3.5  --   --   --   --   --   --    < > = values in this interval not displayed.   GFR: Estimated Creatinine Clearance: 8.8 mL/min (A) (by C-G formula based on SCr of 5.03 mg/dL (H)). Recent Labs  Lab 12/29/23 1033 12/29/23 1231 12/29/23 1323 12/29/23 1549 12/29/23 2135 12/30/23 0332 12/30/23 1011 12/30/23 1047 12/30/23 1601 12/31/23 0400  PROCALCITON  --  0.86  --   --   --   --   --   --   --   --   WBC  --   --    < >  --   --  17.9* 14.9*  --  14.4* 9.6  LATICACIDVEN 3.5*  --   --  6.6* 1.7  --   --  1.4  --   --    < > = values  in this interval not displayed.    Liver Function Tests: Recent Labs  Lab 12/26/23 0518 12/27/23 0439 12/28/23 0531 12/29/23 0422 12/29/23 0926  AST  --   --   --   --  28  ALT  --   --   --   --  23  ALKPHOS  --   --   --   --  51  BILITOT  --   --   --   --  0.8  PROT  --   --   --   --  8.3*  ALBUMIN 1.9* 2.1* 2.2* 2.3* 2.3*   No results for input(s): LIPASE, AMYLASE in the last 168 hours. No results for input(s): AMMONIA in the last 168 hours.  ABG    Component Value Date/Time   PHART 7.488 (H) 12/29/2023 1620   PCO2ART 20.9 (L) 12/29/2023 1620   PO2ART 98 12/29/2023 1620   HCO3 15.9 (L) 12/29/2023 1620   TCO2 17 (L) 12/29/2023 1620   ACIDBASEDEF 7.0 (H) 12/29/2023 1620   O2SAT 68.4 12/30/2023 1601     Coagulation Profile: No results for input(s): INR, PROTIME in the last 168 hours.  Cardiac Enzymes: Recent Labs  Lab 12/29/23 1200  CKTOTAL 257  CKMB 27.4*    HbA1C: Hgb A1c MFr Bld  Date/Time Value Ref Range Status  09/02/2023 06:15 AM 8.7 (H) 4.8 - 5.6 % Final    Comment:    (NOTE) Pre diabetes:          5.7%-6.4%  Diabetes:              >6.4%  Glycemic control for   <7.0% adults with diabetes   06/13/2023 07:40 PM 13.3 (H) 4.8 - 5.6 % Final    Comment:    (NOTE) Pre diabetes:          5.7%-6.4%  Diabetes:              >6.4%  Glycemic control for   <7.0% adults with diabetes     CBG: Recent Labs  Lab 12/30/23 1930 12/30/23 2310 12/31/23 0332 12/31/23 0653 12/31/23 0751  GLUCAP 147*  131* 90 87 94     Critical care time: 32 minutes     Lamar Chris, MD, PhD 12/31/2023, 8:42 AM [facility] [phone] or if no answer before 7:00PM call [phone] For any issues after 7:00PM please call eLink [phone]

## 2023-12-31 NOTE — TOC Progression Note (Signed)
 Transition of Care [facility]) - Progression Note    Patient Details  Name: Derek Thompson MRN: [id number] Date of Birth: 31-Jul-1960  [facility] CM/SW Contact  Isaiah Public, LCSWA Phone Number: 12/31/2023, 3:49 PM  Clinical Narrative:     No current SNF bed offers. ICM will continue to follow.  Expected Discharge Plan: Skilled Nursing Facility Barriers to Discharge: Insurance Authorization               Expected Discharge Plan and Services In-house Referral: Clinical Social Work   Post Acute Care Choice: Skilled Nursing Facility Living arrangements for the past 2 months: Homeless Shelter                                       Social Drivers of Health (SDOH) Interventions SDOH Screenings   Food Insecurity: No Food Insecurity (11/02/2023)  Housing: High Risk (11/02/2023)  Transportation Needs: Unmet Transportation Needs (11/02/2023)  Utilities: Not At Risk (11/02/2023)  Alcohol Screen: Low Risk  (08/19/2022)  Depression (PHQ2-9): Low Risk  (10/11/2023)  Financial Resource Strain: Low Risk  (08/19/2022)  Tobacco Use: Medium Risk (11/17/2023)    Readmission Risk Interventions     No data to display

## 2023-12-31 NOTE — Progress Notes (Signed)
 PHARMACY - PHYSICIAN COMMUNICATION CRITICAL VALUE ALERT - BLOOD CULTURE IDENTIFICATION (BCID)  Derek Thompson is an 63 y.o. male who presented to [facility] on 11/02/2023 with a chief complaint of lethargy.  Assessment:  Pt has had prolonged hospital stay, ABX were started 8/29 for possible sepsis, now growing MRSE in 1/4 blood cx bottles, likely contaminant but is currently covered.  Name of physician contacted: APaliwal MD  Current antibiotics: linezolid  and Zosyn   Changes to prescribed antibiotics recommended:  Patient is on recommended antibiotics - No changes needed  Results for orders placed or performed during the hospital encounter of 11/02/23  Blood Culture ID Panel (Reflexed) (Collected: 12/29/2023  9:40 AM)  Result Value Ref Range   Enterococcus faecalis NOT DETECTED NOT DETECTED   Enterococcus Faecium NOT DETECTED NOT DETECTED   Listeria monocytogenes NOT DETECTED NOT DETECTED   Staphylococcus species DETECTED (A) NOT DETECTED   Staphylococcus aureus (BCID) NOT DETECTED NOT DETECTED   Staphylococcus epidermidis DETECTED (A) NOT DETECTED   Staphylococcus lugdunensis NOT DETECTED NOT DETECTED   Streptococcus species NOT DETECTED NOT DETECTED   Streptococcus agalactiae NOT DETECTED NOT DETECTED   Streptococcus pneumoniae NOT DETECTED NOT DETECTED   Streptococcus pyogenes NOT DETECTED NOT DETECTED   A.calcoaceticus-baumannii NOT DETECTED NOT DETECTED   Bacteroides fragilis NOT DETECTED NOT DETECTED   Enterobacterales NOT DETECTED NOT DETECTED   Enterobacter cloacae complex NOT DETECTED NOT DETECTED   Escherichia coli NOT DETECTED NOT DETECTED   Klebsiella aerogenes NOT DETECTED NOT DETECTED   Klebsiella oxytoca NOT DETECTED NOT DETECTED   Klebsiella pneumoniae NOT DETECTED NOT DETECTED   Proteus species NOT DETECTED NOT DETECTED   Salmonella species NOT DETECTED NOT DETECTED   Serratia marcescens NOT DETECTED NOT DETECTED   Haemophilus influenzae NOT DETECTED NOT  DETECTED   Neisseria meningitidis NOT DETECTED NOT DETECTED   Pseudomonas aeruginosa NOT DETECTED NOT DETECTED   Stenotrophomonas maltophilia NOT DETECTED NOT DETECTED   Candida albicans NOT DETECTED NOT DETECTED   Candida auris NOT DETECTED NOT DETECTED   Candida glabrata NOT DETECTED NOT DETECTED   Candida krusei NOT DETECTED NOT DETECTED   Candida parapsilosis NOT DETECTED NOT DETECTED   Candida tropicalis NOT DETECTED NOT DETECTED   Cryptococcus neoformans/gattii NOT DETECTED NOT DETECTED   Methicillin resistance mecA/C DETECTED (A) NOT DETECTED    Marvetta Dauphin, PharmD, BCPS  12/31/2023  1:03 AM

## 2023-12-31 NOTE — Plan of Care (Signed)
  Problem: Activity: Goal: Risk for activity intolerance will decrease Outcome: Progressing   Problem: Education: Goal: Individualized Educational Video(s) Outcome: Progressing   Problem: Coping: Goal: Ability to adjust to condition or change in health will improve Outcome: Progressing   Problem: Fluid Volume: Goal: Ability to maintain a balanced intake and output will improve Outcome: Progressing   Problem: Health Behavior/Discharge Planning: Goal: Ability to identify and utilize available resources and services will improve Outcome: Progressing Goal: Ability to manage health-related needs will improve Outcome: Progressing   Problem: Metabolic: Goal: Ability to maintain appropriate glucose levels will improve Outcome: Progressing   Problem: Nutritional: Goal: Maintenance of adequate nutrition will improve Outcome: Progressing Goal: Progress toward achieving an optimal weight will improve Outcome: Progressing   Problem: Tissue Perfusion: Goal: Adequacy of tissue perfusion will improve Outcome: Progressing

## 2023-12-31 NOTE — Progress Notes (Addendum)
 eLink Physician-Brief Progress Note Patient Name: Derek Thompson DOB: [birth year] MRN: [id number]   Date of Service  12/31/2023  HPI/Events of Note  Anemic to hemoglobin 6.8, and shock with norepinephrine  at 8 mcg, dobutamine  ongoing, no obvious signs of bleeding  eICU Interventions  Transfuse 1 unit PRBC   0715 -slow to recover blood pressures, norepinephrine  as needed  Intervention Category Intermediate Interventions: Hypotension - evaluation and management  Leala Bryand 12/31/2023, 5:28 AM

## 2024-01-01 DIAGNOSIS — A419 Sepsis, unspecified organism: Secondary | ICD-10-CM | POA: Diagnosis not present

## 2024-01-01 DIAGNOSIS — E871 Hypo-osmolality and hyponatremia: Secondary | ICD-10-CM | POA: Diagnosis not present

## 2024-01-01 DIAGNOSIS — R579 Shock, unspecified: Secondary | ICD-10-CM | POA: Diagnosis not present

## 2024-01-01 DIAGNOSIS — R570 Cardiogenic shock: Secondary | ICD-10-CM | POA: Diagnosis not present

## 2024-01-01 DIAGNOSIS — N179 Acute kidney failure, unspecified: Secondary | ICD-10-CM | POA: Diagnosis not present

## 2024-01-01 LAB — PHOSPHORUS: Phosphorus: 5.1 mg/dL — ABNORMAL HIGH (ref 2.5–4.6)

## 2024-01-01 LAB — TYPE AND SCREEN
ABO/RH(D): B POS
Antibody Screen: NEGATIVE
Unit division: 0
Unit division: 0
Unit division: 0

## 2024-01-01 LAB — CBC
HCT: 28.9 % — ABNORMAL LOW (ref 39.0–52.0)
Hemoglobin: 9.8 g/dL — ABNORMAL LOW (ref 13.0–17.0)
MCH: 28.3 pg (ref 26.0–34.0)
MCHC: 33.9 g/dL (ref 30.0–36.0)
MCV: 83.5 fL (ref 80.0–100.0)
Platelets: 236 K/uL (ref 150–400)
RBC: 3.46 MIL/uL — ABNORMAL LOW (ref 4.22–5.81)
RDW: 16.7 % — ABNORMAL HIGH (ref 11.5–15.5)
WBC: 9 K/uL (ref 4.0–10.5)
nRBC: 0 % (ref 0.0–0.2)

## 2024-01-01 LAB — CULTURE, BLOOD (ROUTINE X 2): Special Requests: ADEQUATE

## 2024-01-01 LAB — COOXEMETRY PANEL
Carboxyhemoglobin: 1.1 % (ref 0.5–1.5)
Methemoglobin: <0.7 % (ref 0.0–1.5)
O2 Saturation: 73.3 %
Total hemoglobin: 10.2 g/dL — ABNORMAL LOW (ref 12.0–16.0)

## 2024-01-01 LAB — BASIC METABOLIC PANEL WITH GFR
Anion gap: 13 (ref 5–15)
BUN: 112 mg/dL — ABNORMAL HIGH (ref 8–23)
CO2: 22 mmol/L (ref 22–32)
Calcium: 8.3 mg/dL — ABNORMAL LOW (ref 8.9–10.3)
Chloride: 91 mmol/L — ABNORMAL LOW (ref 98–111)
Creatinine, Ser: 5.02 mg/dL — ABNORMAL HIGH (ref 0.61–1.24)
GFR, Estimated: 12 mL/min — ABNORMAL LOW (ref >60)
Glucose, Bld: 129 mg/dL — ABNORMAL HIGH (ref 70–99)
Potassium: 4.1 mmol/L (ref 3.5–5.1)
Sodium: 126 mmol/L — ABNORMAL LOW (ref 135–145)

## 2024-01-01 LAB — APTT
aPTT: 53 s — ABNORMAL HIGH (ref 24–36)
aPTT: 55 s — ABNORMAL HIGH (ref 24–36)
aPTT: 51 s — ABNORMAL HIGH (ref 24–36)

## 2024-01-01 LAB — BPAM RBC
Blood Product Expiration Date: 202509212359
Blood Product Expiration Date: 202509292359
Blood Product Expiration Date: 202509292359
ISSUE DATE / TIME: 202508291749
ISSUE DATE / TIME: 202508310552
ISSUE DATE / TIME: 202508312036
Unit Type and Rh: 7300
Unit Type and Rh: 7300
Unit Type and Rh: 7300

## 2024-01-01 LAB — HEMOGLOBIN AND HEMATOCRIT, BLOOD
HCT: 28.5 % — ABNORMAL LOW (ref 39.0–52.0)
HCT: 28.6 % — ABNORMAL LOW (ref 39.0–52.0)
Hemoglobin: 9.4 g/dL — ABNORMAL LOW (ref 13.0–17.0)
Hemoglobin: 9.6 g/dL — ABNORMAL LOW (ref 13.0–17.0)

## 2024-01-01 LAB — GLUCOSE, CAPILLARY
Glucose-Capillary: 101 mg/dL — ABNORMAL HIGH (ref 70–99)
Glucose-Capillary: 141 mg/dL — ABNORMAL HIGH (ref 70–99)
Glucose-Capillary: 165 mg/dL — ABNORMAL HIGH (ref 70–99)
Glucose-Capillary: 80 mg/dL (ref 70–99)
Glucose-Capillary: 82 mg/dL (ref 70–99)
Glucose-Capillary: 88 mg/dL (ref 70–99)

## 2024-01-01 LAB — HEPARIN LEVEL (UNFRACTIONATED): Heparin Unfractionated: >1.1 [IU]/mL — ABNORMAL HIGH (ref 0.30–0.70)

## 2024-01-01 LAB — MAGNESIUM: Magnesium: 2.2 mg/dL (ref 1.7–2.4)

## 2024-01-01 MED ORDER — ASPIRIN 81 MG PO CHEW
81.0000 mg | CHEWABLE_TABLET | Freq: Every day | ORAL | Status: DC
  Administered 2024-01-01 – 2024-01-02 (×2): 81 mg via ORAL
  Filled 2024-01-01 (×2): qty 1

## 2024-01-01 NOTE — Plan of Care (Signed)
  Problem: Activity: Goal: Risk for activity intolerance will decrease Outcome: Progressing   Problem: Education: Goal: Individualized Educational Video(s) Outcome: Progressing   Problem: Coping: Goal: Ability to adjust to condition or change in health will improve Outcome: Progressing   Problem: Fluid Volume: Goal: Ability to maintain a balanced intake and output will improve Outcome: Progressing   Problem: Health Behavior/Discharge Planning: Goal: Ability to identify and utilize available resources and services will improve Outcome: Progressing Goal: Ability to manage health-related needs will improve Outcome: Progressing   Problem: Metabolic: Goal: Ability to maintain appropriate glucose levels will improve Outcome: Progressing   Problem: Nutritional: Goal: Maintenance of adequate nutrition will improve Outcome: Progressing Goal: Progress toward achieving an optimal weight will improve Outcome: Progressing   Problem: Tissue Perfusion: Goal: Adequacy of tissue perfusion will improve Outcome: Progressing

## 2024-01-01 NOTE — Progress Notes (Signed)
 PHARMACY - ANTICOAGULATION CONSULT NOTE  Pharmacy Consult for heparin  Indication: LV thrombus and splenic infarcts  Labs: Recent Labs    12/29/23 1200 12/29/23 1323 12/30/23 1011 12/30/23 1601 12/30/23 2009 12/30/23 2241 12/31/23 0400 12/31/23 1200 12/31/23 1400 01/01/24 0142  HGB  --    < > 7.4* 7.1*  --   --  6.8*  --  6.2* 9.4*  HCT  --    < > 22.6* 21.3*  --   --  20.7*  --  19.2* 28.5*  PLT  --    < > 237 230  --   --  226  --  177  --   APTT  --   --   --   --   --  50*  --  46*  --  53*  HEPARINUNFRC  --   --   --   --   --  >1.10*  --   --   --  >1.10*  CREATININE 4.07*   < > 4.81*  --  5.00*  --  5.03*  --   --   --   CKTOTAL 257  --   --   --   --   --   --   --   --   --   CKMB 27.4*  --   --   --   --   --   --   --   --   --    < > = values in this interval not displayed.   Assessment: 63yo male remains subtherapeutic on heparin  after rate change; no infusion issues per RN and she notes that the color of the urine has gotten lighter since she first had a pt three shifts ago (hopefully implying improving hematuria); Hgb improved after transfusion.  Goal of Therapy:  aPTT 66-85 seconds   Plan:  Increase heparin  infusion by 2-3 units/kg/hr to 850 units/hr. Check PTT in 8 hours.   Marvetta Dauphin, PharmD, BCPS 01/01/2024 2:18 AM

## 2024-01-01 NOTE — Progress Notes (Signed)
 Occupational Therapy Treatment Patient Details Name: Derek Thompson MRN: [id number] DOB: Jun 30, 1960 Today's Date: 01/01/2024   History of present illness Pt is a 63 y/o M presenting to ED on 11/02/23 with lethargy. Pt found to be tachycardic, hyperkalemic, and hyponatremic. 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 Pt transferred to icu on 8/29. 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   OT comments  [facility] OT/PT session:  Patient requiring more assistance with bed mobility, sit to stands, transfers, and self care since moving to ICU on 8/29.  Patient with diarrhea on this session requiring increased assistance for cleaning and transfers to [facility].  Patient to continue to be followed by acute OT with [facility] program to address established goals.       If plan is discharge home, recommend the following:  A little help with walking and/or transfers;A little help with bathing/dressing/bathroom;Assistance with cooking/housework;Direct supervision/assist for medications management;Direct supervision/assist for financial management;Assist for transportation;Help with stairs or ramp for entrance   Equipment Recommendations  Other (comment) (defer)    Recommendations for Other Services      Precautions / Restrictions Precautions Precautions: Fall;Other (comment) Recall of Precautions/Restrictions: Impaired Precaution/Restrictions Comments: R BKA- no pillows under knee Other Brace: prosthetist request use of limb protector in bed to keep knee extension at night Restrictions Weight Bearing Restrictions Per Provider Order: No RLE Weight Bearing Per Provider Order: Non weight bearing Other Position/Activity Restrictions: shrinker       Mobility Bed Mobility Overal bed  mobility: Needs Assistance Bed Mobility: Supine to Sit     Supine to sit: Used rails, HOB elevated, Mod assist     General bed mobility comments: increased cues and asssitance needed to get to EOB    Transfers Overall transfer level: Needs assistance Equipment used: Rolling walker (2 wheels) Transfers: Sit to/from Stand, Bed to chair/wheelchair/BSC Sit to Stand: From elevated surface, Mod assist, +2 physical assistance Stand pivot transfers: Mod assist, +2 physical assistance, +2 safety/equipment, From elevated surface         General transfer comment: performed transfers to [facility] and recliner with mod assist +2 for step pivot transfer and mod assist for balance     Balance Overall balance assessment: Needs assistance Sitting-balance support: No upper extremity supported, Feet supported Sitting balance-Leahy Scale: Good Sitting balance - Comments: able to perform self care tasks seated on EOB without LOB   Standing balance support: Bilateral upper extremity supported, During functional activity Standing balance-Leahy Scale: Poor Standing balance comment: reliant on BUE support on this date with patient unable to stand with one extremity support for LB ADLs                           ADL either performed or assessed with clinical judgement   ADL Overall ADL's : Needs assistance/impaired     Grooming: Wash/dry hands;Wash/dry face;Set up;Sitting       Lower Body Bathing: Maximal assistance;Sit to/from stand Lower Body Bathing Details (indicate cue type and reason): stood at EOB with max assist for peri area bathing Upper Body Dressing : Minimal assistance;Sitting Upper Body Dressing Details (indicate cue type and reason): due to lines Lower Body Dressing: Maximal assistance;Sit to/from stand Lower Body Dressing Details (indicate cue type and reason): donned brief and  sock on LLE Toilet Transfer: Moderate assistance;+2 for physical assistance;BSC/3in1;Rolling  walker (2 wheels) Toilet Transfer Details (indicate cue type and reason): mod assist to steady and pivot Toileting- Clothing Manipulation and Hygiene: Total assistance;Sit to/from stand Toileting - Clothing Manipulation Details (indicate cue type and reason): patient reliant on BUE support while standing       General ADL Comments: increased assistance with self care due to lines and increased weakness    Extremity/Trunk Assessment              Vision       Perception     Praxis     Communication Communication Communication: Impaired Factors Affecting Communication: Difficulty expressing self   Cognition Arousal: Alert Behavior During Therapy: Flat affect Cognition: Cognition impaired       Memory impairment (select all impairments): Short-term memory   Executive functioning impairment (select all impairments): Problem solving                   Following commands: Impaired Following commands impaired: Follows one step commands with increased time      Cueing   Cueing Techniques: Verbal cues, Gestural cues, Tactile cues  Exercises      Shoulder Instructions       General Comments HR inreased to 120s    Pertinent Vitals/ Pain       Pain Assessment Pain Assessment: Faces Faces Pain Scale: Hurts a little bit Pain Location: abdominal pains from gas Pain Descriptors / Indicators: Grimacing, Tiring Pain Intervention(s): Limited activity within patient's tolerance, Monitored during session, Repositioned  Home Living                                          Prior Functioning/Environment              Frequency  Min 2X/week (on [facility] Program)        Progress Toward Goals  OT Goals(current goals can now be found in the care plan section)  Progress towards OT goals: Progressing toward goals  Acute Rehab OT Goals Patient Stated Goal: feel better OT Goal Formulation: With patient Time For Goal Achievement:  01/03/24 Potential to Achieve Goals: Fair ADL Goals Pt Will Perform Grooming: with supervision;standing Pt Will Perform Lower Body Bathing: with set-up;sitting/lateral leans Pt Will Perform Upper Body Dressing: with set-up;sitting Pt Will Perform Lower Body Dressing: with set-up;sitting/lateral leans Pt Will Transfer to Toilet: with supervision;ambulating;bedside commode Pt Will Perform Toileting - Clothing Manipulation and hygiene: with set-up;sitting/lateral leans Pt/caregiver will Perform Home Exercise Program: Increased strength;Both right and left upper extremity;With theraband;With theraputty;Independently;With written HEP provided Additional ADL Goal #1: Pt will be able to follow 1 step commands with >50% accuracy to maximize participation in ADLs  Plan      Co-evaluation    PT/OT/SLP Co-Evaluation/Treatment: Yes Reason for Co-Treatment: To address functional/ADL transfers (decreased activity tolerance) PT goals addressed during session: Mobility/safety with mobility OT goals addressed during session: ADL's and self-care      AM-PAC OT 6 Clicks Daily Activity     Outcome Measure   Help from another person eating meals?: None Help from another person taking care of personal grooming?: A Little Help from another person toileting, which includes using toliet, bedpan, or urinal?: A Lot Help from another person bathing (including washing, rinsing, drying)?: A Little Help from another person to put on and taking off regular upper  body clothing?: A Little Help from another person to put on and taking off regular lower body clothing?: A Lot 6 Click Score: 17    End of Session Equipment Utilized During Treatment: Gait belt;Rolling walker (2 wheels)  OT Visit Diagnosis: Unsteadiness on feet (R26.81);Other abnormalities of gait and mobility (R26.89);Muscle weakness (generalized) (M62.81);Other symptoms and signs involving cognitive function Pain - part of body:  (abdomen)    Activity Tolerance Patient tolerated treatment well   Patient Left in chair;with call bell/phone within reach;with chair alarm set   Nurse Communication Mobility status        Time: 9165-9087 OT Time Calculation (min): 38 min  Charges: OT General Charges $OT Visit: 1 Visit OT Treatments $Self Care/Home Management : 23-37 mins  Dick Laine, OTA Acute Rehabilitation Services  Office [phone]   Jeb LITTIE Laine 01/01/2024, 2:03 PM

## 2024-01-01 NOTE — Progress Notes (Signed)
 NAME:  Derek Thompson, MRN:  [id number], DOB:  1961-03-29, LOS: 59 ADMISSION DATE:  11/02/2023, CONSULTATION DATE:  829 REFERRING MD:  Shawn, CHIEF COMPLAINT:  shock   History of Present Illness:  63 year old male resided at homeless shelter prior to admission presented initially back on 7/4 with acute mental status change, lethargy, hyponatremia, AKI, and hyperkalemia.  Was tachycardic on initial evaluation, and treated volume resuscitation which resulted in improved sodium up to 126, he had significant urinary retention and a Foley catheter was placed, and he was empirically started on antibiotics for presumed urinary tract infection .  His hospital course has been prolonged, clinically had improved over the first 2 weeks of therapy.  Orthopedic surgery was consulted, who also recommended BKA of the right extremity.  Early in the hospital admission was noted to have a fairly significant protein gap with a serum protein level of 7.6 and an albumin of 2.6 triggering further investigation yielding abnormal proteins SPEP with gammaglobulin 2.1 and M spike of 1.1 for which oncology was consulted.  Ultimately underwent bone marrow biopsy with findings consistent with multiple myeloma, plan was to follow-up with oncology.  He underwent BKA on 7/18. Overall it appears as though his hospital course had been improving and he had actually been cleared for hospital discharge in early August, he had been working with hospital physical therapy, and enrolled in the [place] program. On 8/25 chart review reports patient reporting some nausea,  Still working with physical therapy.  Last Foley catheter changed on 8/8 with coud 8/26 poor appetite 8/27 function a little worse, baseline felt to be somewhere in the range of 2-3 rising up to 3.9 with BUN up to 109 Farxiga  was stopped got some Lokelma  for hyperkalemia.  Clinically appeared volume depleted encouraged to increase oral intake, and administered crystalloid.   Renal function improved a little bit down to 3.48 8/28 held off on further hydration.  The hyponatremia and renal dysfunction was also felt to be at least in part due to the amount of clonal gammopathy with pseudohyponatremia. On 8/29 patient's sodium noted to be down to 121, at that time his heart rate was 121 blood pressure 99/66 however bedside evaluation found him to be awake oriented without any focal deficits no headaches or dizziness.  Later in the morning around 7:30 AM the patient was found confused and febrile.  He was administered Tylenol , 1 L of saline was administered, blood cultures sent, and started on broad-spectrum antibiotics.  Later that morning around 1036 heart rate up in the 140s, new hematuria (this has been a intermittent problem during the hospitalization) patient more confused, blood pressure in the 60s systolically still receiving the crystalloid previously ordered stat echocardiogram obtained, rapid response called, critical care asked to evaluate at bedside.  Stat echocardiogram result showed LVEF less than 20% with a large amount of soft thrombus/slow flow at the LV apex there is moderate RV systolic dysfunction with concern for cardiogenic shock.  He was moved to the intensive care, initially there was plan for CT abdomen pelvis to rule out possible sepsis source superimposed on cardiogenic shock in the context of his delirium, fever, and hematuria however there was insufficient IV access to obtain that.  His care was assumed by critical care services as of 8/29 with a working diagnosis of undifferentiated shock, acute on chronic renal failure, acute on chronic hyponatremia, and acute metabolic encephalopathy.   Pertinent  Medical History  Heart failure with reduced EF, baseline in the  20 range, urinary retention secondary to history of prostate cancer with chronic Foley, chronic right lower extremity osteomyelitis, CKD stage IIIb.  Significant Hospital Events: Including  procedures, antibiotic start and stop dates in addition to other pertinent events   Admitted 7/4 Bone marrow biopsy 7/17 7/18 right BKA 8/27 through 8/28 rising serum creatinine and BUN 8/29 new fever, hematuria, acute mental status change, acute hyponatremia.  New shock.  Required norepinephrine . Stat echocardiogram result showed LVEF less than 20% with a large amount of soft thrombus/slow flow at the LV apex there is moderate RV systolic dysfunction with concern for cardiogenic shock.  Central line placed, Co. oximetry obtained with SpO2 in the 40s continued on norepinephrine , checking stat CBC needs to be above 7, if CBC dropping further may need to consider transfusion 8/29 CT abdomen and pelvis > LV thrombus noted, multiple splenic infarcts, moderate bilateral hydronephrosis and hydroureter with urothelial enhancement consistent with infectious cystitis and probable left pyelonephritis.  Gallbladder wall thickening, consistent with gallbladder wall edema.  Mild ascites.  Interim History / Subjective:  -Comfortable - No further SVT - Norepinephrine  has been weaned off, remains on dobutamine  2.5 - Co. oximetry 73% - Room air - Heparin  drip, hemoglobin 9.8 - I/O- 47.6 L total, urine output 1950 cc / 24 hours - SCr remains 5.0 -1 of 2 bottles Staph epidermidis, MRSE (question contaminant)   Objective    Blood pressure 107/69, pulse (!) 115, temperature 98.7 F (37.1 C), temperature source Oral, resp. rate 10, height 6' (1.829 m), weight 42.7 kg, SpO2 100%. CVP:  [4 mmHg-67 mmHg] 4 mmHg      Intake/Output Summary (Last 24 hours) at 01/01/2024 0828 Last data filed at 01/01/2024 0800 Gross per 24 hour  Intake 2189.12 ml  Output 1950 ml  Net 239.12 ml   Filed Weights   12/29/23 1215 12/30/23 1335 01/01/24 0600  Weight: 40.1 kg 42.1 kg 42.7 kg    Examination: General: Thin, comfortable, no distress HENT: Oropharynx clear, strong voice, no secretions Lungs: Few scattered bibasilar  crackles, otherwise clear Cardiovascular: Regular, 90s, holosystolic murmur Abdomen: Nondistended with positive bowel sounds Extremities: Right BKA, no edema Neuro: Awake, alert, interacting appropriately, follows commands GU: Urine remains blood-tinged  Resolved problem list   Assessment and Plan   Cardiogenic and septic shock with lactic acidosis.  Presumed source urinary tract MRSE in 1 of 2 blood culture bottles, suspect contaminant Has known history of heart failure with reduced EF, typically in the 20-25 range now down to about 13%.  Suspect source of sepsis is his chronic Foley especially with onset of new hematuria, but now biventricular heart failure a significant contributing factor Plan -Continue hold beta-blocker - Appears to be euvolemic, recheck CVP today 9/1.  Suspect we can hold off on diuresis and follow renal improvement - Discussed case with Dr. Rolan, plan to leave him on dobutamine  2.5 for another 1-2 days and follow CVP, Co-oximetry - Continue Zosyn  for presumed urinary source.  Hold off on adding back gram-positive coverage, suspect MRSE is a contaminant.  Will follow other cultures to ensure they are not confirmatory - Appreciate advanced heart failure team management - Remains on statin - Plan to restart low-dose aspirin  9/1  Known LV thrombus Was on Eliquis  Plan -Continue heparin  drip and followed CBC, hematuria  - Eliquis  held, restarted heparin  on 8/30 with some persistent hematuria and now requiring transfusion 8/31.  Hopefully will be able to continue.  Follow CBC and hematuria to determine  Acute on  chronic renal failure.  Baseline creatinine felt to be in the mid 2-3 range, with CKD stage IIIb at baseline History of urinary retention and prostate cancer with chronic Foley catheter. His underlying CKD felt to be due to monoclonal gammopathy  Plan -Good urine output and serum creatinine has plateaued at 5, without any clear indication for HD.   Hopefully with adequate renal perfusion will improve further.  CVP is 5 and he tolerates volume shifts poorly.  No plans for IVF bolus or diuresis today  Hematuria.  Apparently this has been on and off through the hospitalization.  Was slightly better off anticoagulation, now present again Plan -Continue IV heparin  and add back low-dose aspirin  9/1 - Follow CBC - Coud catheter is in place  Acute metabolic encephalopathy.  Improved Plan - Supportive care, reverse underlying conditions - Holding sedating medications - Following sodium as below - EEG was reassuring  Acute on chronic hyponatremia. Complicated clinical picture, already hyperosmolar and then received IV fluid resuscitation due to sepsis, acute drop in sodium.  Does not appear volume overloaded and has improved without 3% saline Plan -For crystalloid administration, follow sodium.  Drifting back up on its own now that he has adequate urine output - Continue support hemodynamics - Would only consider hypertonic saline if he became obtunded or sodium drop below 120  Hyperkalemia, resolved Plan -Follow intermittent BMP - Replete if indicated  Acute on chronic anemia, component of acute blood loss from hematuria Plan -Continue to follow hemoglobin - Hemoglobin goal 7.0  Type 2 diabetes with hyperglycemia Plan -Sliding scale insulin  as ordered - Lantus   Hypoalbuminemia, and protein calorie malnutrition Plan -Supportive care, nutritional support  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  Completed on 7/18.  Currently receiving physical therapy for significant deconditioning Plan -PT/OT as he can tolerate  Elevated protein gap with monoclonal gammopathy and suspected multiple myeloma from biopsy on 7/17 Plan -Supportive care - Note possible impact on his osmolality, sodium  CODE STATUS Plan -Full code  Disposition: - To remain stable then can probably go back out to progressive bed, 3 [address]  to complete dobutamine    Labs   CBC: Recent Labs  Lab 12/29/23 0926 12/29/23 1323 12/30/23 1011 12/30/23 1601 12/31/23 0400 12/31/23 1400 01/01/24 0142 01/01/24 0457  WBC 18.0*   < > 14.9* 14.4* 9.6 6.1  --  9.0  NEUTROABS 16.7*  --   --   --   --   --   --   --   HGB 8.0*   < > 7.4* 7.1* 6.8* 6.2* 9.4* 9.8*  HCT 24.5*   < > 22.6* 21.3* 20.7* 19.2* 28.5* 28.9*  MCV 84.2   < > 84.6 84.2 84.8 88.1  --  83.5  PLT 292   < > 237 230 226 177  --  236   < > = values in this interval not displayed.    Basic Metabolic Panel: Recent Labs  Lab 12/26/23 0518 12/27/23 0439 12/28/23 0531 12/29/23 0422 12/29/23 1200 12/30/23 0332 12/30/23 1011 12/30/23 2009 12/31/23 0400 01/01/24 0457  NA 127* 126* 130* 121*   < > 121* 122* 120* 123* 126*  K 5.8* 5.3* 4.8 5.1   < > 5.0 4.5 4.3 4.1 4.1  CL 91* 87* 91* 84*   < > 87* 87* 87* 90* 91*  CO2 28 26 27  20*   < > 21* 21* 22 21* 22  GLUCOSE 120* 108* 102* 183*   < > 156* 116*  124* 88 129*  BUN 91* 109* 116* 126*   < > 128* 127* 130* 127* 112*  CREATININE 3.80* 3.90* 3.48* 3.96*   < > 4.68* 4.81* 5.00* 5.03* 5.02*  CALCIUM  8.9 8.9 9.2 8.8*   < > 8.0* 8.2* 8.0* 8.1* 8.3*  MG  --   --   --   --   --   --   --   --   --  2.2  PHOS 4.9* 4.4 4.7* 3.5  --   --   --   --   --  5.1*   < > = values in this interval not displayed.   GFR: Estimated Creatinine Clearance: 9 mL/min (A) (by C-G formula based on SCr of 5.02 mg/dL (H)). Recent Labs  Lab 12/29/23 1033 12/29/23 1231 12/29/23 1323 12/29/23 1549 12/29/23 2135 12/30/23 0332 12/30/23 1047 12/30/23 1601 12/31/23 0400 12/31/23 1400 01/01/24 0457  PROCALCITON  --  0.86  --   --   --   --   --   --   --   --   --   WBC  --   --    < >  --   --    < >  --  14.4* 9.6 6.1 9.0  LATICACIDVEN 3.5*  --   --  6.6* 1.7  --  1.4  --   --   --   --    < > = values in this interval not displayed.    Liver Function Tests: Recent Labs  Lab 12/26/23 0518 12/27/23 0439 12/28/23 0531  12/29/23 0422 12/29/23 0926  AST  --   --   --   --  28  ALT  --   --   --   --  23  ALKPHOS  --   --   --   --  51  BILITOT  --   --   --   --  0.8  PROT  --   --   --   --  8.3*  ALBUMIN 1.9* 2.1* 2.2* 2.3* 2.3*   No results for input(s): LIPASE, AMYLASE in the last 168 hours. No results for input(s): AMMONIA in the last 168 hours.  ABG    Component Value Date/Time   PHART 7.488 (H) 12/29/2023 1620   PCO2ART 20.9 (L) 12/29/2023 1620   PO2ART 98 12/29/2023 1620   HCO3 15.9 (L) 12/29/2023 1620   TCO2 17 (L) 12/29/2023 1620   ACIDBASEDEF 7.0 (H) 12/29/2023 1620   O2SAT 73.3 01/01/2024 0500     Coagulation Profile: No results for input(s): INR, PROTIME in the last 168 hours.  Cardiac Enzymes: Recent Labs  Lab 12/29/23 1200  CKTOTAL 257  CKMB 27.4*    HbA1C: Hgb A1c MFr Bld  Date/Time Value Ref Range Status  09/02/2023 06:15 AM 8.7 (H) 4.8 - 5.6 % Final    Comment:    (NOTE) Pre diabetes:          5.7%-6.4%  Diabetes:              >6.4%  Glycemic control for   <7.0% adults with diabetes   06/13/2023 07:40 PM 13.3 (H) 4.8 - 5.6 % Final    Comment:    (NOTE) Pre diabetes:          5.7%-6.4%  Diabetes:              >6.4%  Glycemic control for   <7.0% adults with diabetes  CBG: Recent Labs  Lab 12/31/23 1635 12/31/23 1926 12/31/23 2330 01/01/24 0329 01/01/24 0755  GLUCAP 160* 153* 161* 82 165*     Critical care time: 33 minutes     Lamar Chris, MD, PhD 01/01/2024, 8:28 AM [facility] [phone] or if no answer before 7:00PM call [phone] For any issues after 7:00PM please call eLink [phone]

## 2024-01-01 NOTE — Progress Notes (Signed)
 Physical Therapy RE-EVALUATION  Patient Details Name: Derek Thompson MRN: [id number] DOB: 10-20-1960 Today's Date: 01/01/2024   History of Present Illness Pt is a 63 y/o M presenting to ED on 11/02/23 with lethargy. Pt found to be tachycardic, hyperkalemic, and hyponatremic. 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 Pt transferred to icu on 8/29. 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    PT Comments  Pt transferred back to ICU on 8/29. Pt now presenting with functional decline due to weakness, freq diarrhea, and poor activity tolerance. Pt now requiring mod/maxA for safe sit to stand and step pvt transfer to recliner. Acute PT to cont to follow.   If plan is discharge home, recommend the following: A lot of help with bathing/dressing/bathroom;Supervision due to cognitive status;Help with stairs or ramp for entrance;Assistance with cooking/housework;Assist for transportation;A little help with walking and/or transfers   Can travel by private vehicle     Yes  Equipment Recommendations  BSC/3in1;Wheelchair (measurements PT);Wheelchair cushion (measurements PT);Other (comment)    Recommendations for Other Services       Precautions / Restrictions Precautions Precautions: Fall;Other (comment) Recall of Precautions/Restrictions: Impaired Precaution/Restrictions Comments: R BKA- no pillows under knee Required Braces or Orthoses: Other Brace Other Brace: prosthetist request use of limb protector in bed to keep knee extension at night Restrictions Weight Bearing Restrictions Per Provider Order: No RLE Weight Bearing Per Provider Order: Non weight bearing Other Position/Activity Restrictions: shrinker     Mobility  Bed Mobility Overal bed mobility: Needs  Assistance Bed Mobility: Supine to Sit     Supine to sit: Used rails, HOB elevated, Mod assist     General bed mobility comments: max directional verbal cues, delayed response time, modA to scoot to EOB and trunk elevation    Transfers Overall transfer level: Needs assistance Equipment used: Rolling walker (2 wheels) Transfers: Sit to/from Stand, Bed to chair/wheelchair/BSC Sit to Stand: From elevated surface, Mod assist, +2 physical assistance Stand pivot transfers: Mod assist, +2 physical assistance, +2 safety/equipment, From elevated surface         General transfer comment: pt unsteady requiring modA to maintain balance, 3 sit to stands completed, modA to maintain standing for hygiene s/p BM, pt with total of 3 episodes of diarrhea, dependent for hygiene, pt requiring maxAx2 for step pvt to chair via hopping, pt unsteady requiring assist for walker management and stability    Ambulation/Gait               General Gait Details: unable this date due to diarrhea   Stairs             Wheelchair Mobility     Tilt Bed    Modified Rankin (Stroke Patients Only)       Balance Overall balance assessment: Needs assistance Sitting-balance support: No upper extremity supported, Feet supported Sitting balance-Leahy Scale: Good Sitting balance - Comments: able to perform self care tasks seated on EOB without LOB   Standing balance support: Bilateral upper extremity supported, During functional activity Standing balance-Leahy Scale: Poor Standing balance comment: pt unsteady this date                            Communication Communication Communication: Impaired Factors Affecting Communication: Difficulty expressing self  Cognition Arousal: Alert Behavior During Therapy: Flat affect   PT - Cognitive impairments: Problem solving                       PT - Cognition Comments: despite recent medical changes and delayed response time pt  oriented x4. Pt with delayed response time and cues for sequencing, unaware of diarrhea Following commands: Impaired Following commands impaired: Follows one step commands with increased time    Cueing Cueing Techniques: Verbal cues, Gestural cues, Tactile cues  Exercises      General Comments General comments (skin integrity, edema, etc.): HR 120s during session      Pertinent Vitals/Pain Pain Assessment Pain Assessment: Faces Faces Pain Scale: Hurts a little bit Pain Location: reports of gas pains Pain Descriptors / Indicators: Grimacing, Tiring    Home Living                          Prior Function            PT Goals (current goals can now be found in the care plan section) Acute Rehab PT Goals Patient Stated Goal: to get to rehab PT Goal Formulation: With patient Time For Goal Achievement: 01/15/24 Potential to Achieve Goals: Good Progress towards PT goals: Not progressing toward goals - comment (medical set back)    Frequency    Min 2X/week      PT Plan      Co-evaluation PT/OT/SLP Co-Evaluation/Treatment: Yes Reason for Co-Treatment: To address functional/ADL transfers (decreased activity tolerance) PT goals addressed during session: Mobility/safety with mobility        AM-PAC PT 6 Clicks Mobility   Outcome Measure  Help needed turning from your back to your side while in a flat bed without using bedrails?: A Lot Help needed moving from lying on your back to sitting on the side of a flat bed without using bedrails?: A Lot Help needed moving to and from a bed to a chair (including a wheelchair)?: A Lot Help needed standing up from a chair using your arms (e.g., wheelchair or bedside chair)?: A Lot Help needed to walk in hospital room?: A Lot Help needed climbing 3-5 steps with a railing? : Total 6 Click Score: 11    End of Session Equipment Utilized During Treatment: Gait belt Activity Tolerance: Patient limited by fatigue Patient  left: with call bell/phone within reach;in chair;with chair alarm set Nurse Communication: Mobility status PT Visit Diagnosis: Unsteadiness on feet (R26.81);Difficulty in walking, not elsewhere classified (R26.2);Other abnormalities of gait and mobility (R26.89);Muscle weakness (generalized) (M62.81) Pain - Right/Left: Right Pain - part of body: Leg     Time: 9165-9087 PT Time Calculation (min) (ACUTE ONLY): 38 min  Charges:      PT General Charges $$ ACUTE PT VISIT: 1 Visit                     Norene Ames, PT, DPT Acute Rehabilitation Services Secure chat preferred Office #: [phone]    Norene CHRISTELLA Ames 01/01/2024, 11:54 AM

## 2024-01-01 NOTE — Progress Notes (Addendum)
 Patient ID: Derek Thompson, male   DOB: Jun 20, 1960, 63 y.o.   MRN: [id number]     Advanced Heart Failure Rounding Note  Cardiologist: Vinie JAYSON Maxcy, MD  Chief Complaint: Shock Subjective:    No complaints this morning, no dyspnea.  Awake/alert.   Continues dobutamine  2.5, NE 1.  Co-ox 73%, CVP 4.  I/Os even with good UOP.  MAP stable. NSR 90s.   BUN/creatinine remain elevated but stable, 127/5.03 => 112/5.02, Na 121 => 123 => 126.    WBCs 17.9 => 9.6 => 9, Afebrile. He is on Zosyn . Suspect urinary source for septic shock.   Still has hematuria, hgb 9.8 after 1 unit PRBCs yesterday.  Back on heparin  gtt.     Objective:   Weight Range: 42.7 kg Body mass index is 12.77 kg/m.   Vital Signs:   Temp:  [97.6 F (36.4 C)-100.2 F (37.9 C)] 98.7 F (37.1 C) (09/01 0757) Pulse Rate:  [94-119] 115 (09/01 0815) Resp:  [9-23] 10 (09/01 0815) BP: (87-123)/(49-87) 107/69 (09/01 0815) SpO2:  [91 %-100 %] 100 % (09/01 0815) Weight:  [42.7 kg] 42.7 kg (09/01 0600) Last BM Date : 12/31/23  Weight change: Filed Weights   12/29/23 1215 12/30/23 1335 01/01/24 0600  Weight: 40.1 kg 42.1 kg 42.7 kg    Intake/Output:   Intake/Output Summary (Last 24 hours) at 01/01/2024 0835 Last data filed at 01/01/2024 0800 Gross per 24 hour  Intake 2189.12 ml  Output 1950 ml  Net 239.12 ml      Physical Exam    General: NAD Neck: No JVD, no thyromegaly or thyroid nodule.  Lungs: Clear to auscultation bilaterally with normal respiratory effort. CV: Nondisplaced PMI.  Heart regular S1/S2, no S3/S4, no murmur.  No peripheral edema.    Abdomen: Soft, nontender, no hepatosplenomegaly, no distention.  Skin: Intact without lesions or rashes.  Neurologic: Alert and oriented x 3.  Psych: Normal affect. Extremities: No clubbing or cyanosis. Right BKA.  HEENT: Normal.   Telemetry   NSR 90s (personally reviewed)  Labs    CBC Recent Labs    12/29/23 0926 12/29/23 1323 12/31/23 1400  01/01/24 0142 01/01/24 0457  WBC 18.0*   < > 6.1  --  9.0  NEUTROABS 16.7*  --   --   --   --   HGB 8.0*   < > 6.2* 9.4* 9.8*  HCT 24.5*   < > 19.2* 28.5* 28.9*  MCV 84.2   < > 88.1  --  83.5  PLT 292   < > 177  --  236   < > = values in this interval not displayed.   Basic Metabolic Panel Recent Labs    91/68/74 0400 01/01/24 0457  NA 123* 126*  K 4.1 4.1  CL 90* 91*  CO2 21* 22  GLUCOSE 88 129*  BUN 127* 112*  CREATININE 5.03* 5.02*  CALCIUM  8.1* 8.3*  MG  --  2.2  PHOS  --  5.1*   Liver Function Tests Recent Labs    12/29/23 0926  AST 28  ALT 23  ALKPHOS 51  BILITOT 0.8  PROT 8.3*  ALBUMIN 2.3*   No results for input(s): LIPASE, AMYLASE in the last 72 hours. Cardiac Enzymes Recent Labs    12/29/23 1200  CKTOTAL 257  CKMB 27.4*    BNP: BNP (last 3 results) Recent Labs    06/29/23 0447 11/02/23 1552 11/03/23 0824  BNP 3,373.6* 4,136.5* 3,980.4*    ProBNP (last 3  results) No results for input(s): PROBNP in the last 8760 hours.   D-Dimer No results for input(s): DDIMER in the last 72 hours. Hemoglobin A1C No results for input(s): HGBA1C in the last 72 hours. Fasting Lipid Panel No results for input(s): CHOL, HDL, LDLCALC, TRIG, CHOLHDL, LDLDIRECT in the last 72 hours. Thyroid Function Tests No results for input(s): TSH, T4TOTAL, T3FREE, THYROIDAB in the last 72 hours.  Invalid input(s): FREET3  Other results:   Imaging    No results found.    Medications:     Scheduled Medications:  sodium chloride    Intravenous Once   atorvastatin   80 mg Oral Daily   Chlorhexidine  Gluconate Cloth  6 each Topical Daily   ezetimibe   10 mg Oral Daily   feeding supplement  237 mL Oral BID BM   insulin  aspart  0-9 Units Subcutaneous Q4H   insulin  glargine  5 Units Subcutaneous Daily   lidocaine   1 Application Urethral Once   mupirocin  ointment   Nasal BID    Infusions:  DOBUTamine  2.5 mcg/kg/min (01/01/24  0800)   heparin  850 Units/hr (01/01/24 0800)   norepinephrine  (LEVOPHED ) Adult infusion Stopped (01/01/24 0548)   piperacillin -tazobactam (ZOSYN )  IV Stopped (01/01/24 0513)    PRN Medications: acetaminophen , mouth rinse, polyethylene glycol    Assessment/Plan   1. Shock: Suspect mixed septic/cardiogenic.  Echo with EF < 20%, LV thrombus, moderate RV dysfunction.  Now on NE 1 + dobutamine  2.5 with co-ox 73%.  CVP 4 on my read.    - Not markedly volume overloaded and BUN/creatinine very high, no Lasix  at this time.   - Continue dobutamine  2.5.  - Stop NE today.   - Broad spectrum abx with Zosyn .  2. Acute on chronic systolic CHF: Echo with EF < 20%, LV thrombus, moderate RV dysfunction.  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  Last cath in 6/24 showed 70% proximal LCx stenosis. Now with shock, see plan above.  3.  Gross hematuria: Suspect due to chronic foley + UTI. Improving, hgb 9.8 after 1 unit PRBCs yesterday.  - Continue heparin  for now with LV thrombus.   4. 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   - Heparin  restarted, eventual Eliquis .     5. Multiple myeloma: New diagnosis this admission.  6. 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 7. ID: Suspect septic shock as above, probably from UTI/urosepsis.  - Continue Zosyn  per CCM.  8. AKI on CKD stage 3: Baseline creatinine 1.5. BUN/creatinine stabilized today at 127/5.03 => 112/5.02.  Suspect due to hypotension/shock.  - Follow closely, hopefully will improve over time.  - Would continue dobutamine  for now until we see a downtrend in creatinine.  9. Hyponatremia: Na stable at 121 => 123 => 126.  He is mentally clear.   - Fluid restrict  - With AKI and CVP not significantly elevated, would avoid tolvaptan.  - Would hold off on 3% saline unless sodium goes < 120 given lack of confusion or evidence for symptomatic hyponatremia.   Discussed with Dr. Shelah.   CRITICAL CARE Performed by: Ezra Shuck  Total critical care time: 35 minutes  Critical care time was exclusive of separately billable procedures and treating other patients.  Critical care was necessary to treat or prevent imminent or life-threatening deterioration.  Critical care was time spent personally by me on the following activities: development of treatment plan with patient and/or surrogate as well as nursing, discussions with consultants, evaluation of patient's response to treatment,  examination of patient, obtaining history from patient or surrogate, ordering and performing treatments and interventions, ordering and review of laboratory studies, ordering and review of radiographic studies, pulse oximetry and re-evaluation of patient's condition.    Length of Stay: 55  Ezra Shuck, MD  01/01/2024, 8:35 AM  Advanced Heart Failure Team Pager [phone] (M-F; 7a - 5p)  Please contact [facility] for night-coverage after hours (5p -7a ) and weekends on [email or web address]

## 2024-01-01 NOTE — Progress Notes (Signed)
 PHARMACY - ANTICOAGULATION CONSULT NOTE  Pharmacy Consult:  Heparin  Indication: LV thrombus  Allergies  Allergen Reactions   Cefepime  Other (See Comments)    Cefepime  neurotoxicity 11/07/23    Patient Measurements: Height: 6' (182.9 cm) Weight: 42.7 kg (94 lb 2.2 oz) IBW/kg (Calculated) : 77.6 HEPARIN  DW (KG): 42.1  Vital Signs: Temp: 98.3 F (36.8 C) (09/01 1119) Temp Source: Oral (09/01 1119) BP: 107/69 (09/01 0815) Pulse Rate: 115 (09/01 0815)  Labs: Recent Labs    12/29/23 1200 12/29/23 1323 12/30/23 1601 12/30/23 2009 12/30/23 2241 12/31/23 0400 12/31/23 1200 12/31/23 1400 01/01/24 0142 01/01/24 0457 01/01/24 1000  HGB  --    < >  --   --   --  6.8*  --  6.2* 9.4* 9.8*  --   HCT  --    < >  --   --   --  20.7*  --  19.2* 28.5* 28.9*  --   PLT  --    < >  --   --   --  226  --  177  --  236  --   APTT  --   --    < >  --  50*  --  46*  --  53*  --  55*  HEPARINUNFRC  --   --   --   --  >1.10*  --   --   --  >1.10*  --   --   CREATININE 4.07*   < >  --  5.00*  --  5.03*  --   --   --  5.02*  --   CKTOTAL 257  --   --   --   --   --   --   --   --   --   --   CKMB 27.4*  --   --   --   --   --   --   --   --   --   --    < > = values in this interval not displayed.    Estimated Creatinine Clearance: 9 mL/min (A) (by C-G formula based on SCr of 5.02 mg/dL (H)).   Assessment: 32 YOM with  LV thrombus off Eliquis  due to hematuria, last dose 8/29 AM.  CBC stable and Pharmacy consulted to dose IV heparin .  CT also shows new polypoid thrombus and multiple splenic infarcts.  Confirmed weight of ~42 kg.   aPTT sub-therapeutic at 55 sec.  Heparin  level remains elevated due to Eliquis ' effect.  Hemoglobin trended down and patient is s/p transfusion.  Hematuria lighter per RN.  Goal of Therapy:  Heparin  level 0.3-0.5 units/ml aPTT 66-85 seconds d/t bleeding risk Monitor platelets by anticoagulation protocol: Yes   Plan:  No bolus per MD Increase heparin  gtt to  1000 units/hr Check 8 hr aPTT Daily heparin  level, aPTT and CBC Monitor for hematuria resolution  Aliahna Statzer D. Lendell, PharmD, BCPS, BCCCP 01/01/2024, 11:21 AM

## 2024-01-01 NOTE — Progress Notes (Signed)
 PHARMACY - ANTICOAGULATION CONSULT NOTE  Pharmacy Consult:  Heparin  Indication: LV thrombus  Allergies  Allergen Reactions   Cefepime  Other (See Comments)    Cefepime  neurotoxicity 11/07/23    Patient Measurements: Height: 6' (182.9 cm) Weight: 42.7 kg (94 lb 2.2 oz) IBW/kg (Calculated) : 77.6 HEPARIN  DW (KG): 42.1  Vital Signs: Temp: 98.5 F (36.9 C) (09/01 2019) Temp Source: Oral (09/01 2019) BP: 106/77 (09/01 2019) Pulse Rate: 98 (09/01 1615)  Labs: Recent Labs    12/30/23 2009 12/30/23 2241 12/31/23 0400 12/31/23 1200 12/31/23 1400 01/01/24 0142 01/01/24 0457 01/01/24 1000 01/01/24 1328 01/01/24 2153  HGB  --   --  6.8*  --  6.2* 9.4* 9.8*  --  9.6*  --   HCT  --   --  20.7*  --  19.2* 28.5* 28.9*  --  28.6*  --   PLT  --   --  226  --  177  --  236  --   --   --   APTT  --  50*  --    < >  --  53*  --  55*  --  51*  HEPARINUNFRC  --  >1.10*  --   --   --  >1.10*  --   --   --   --   CREATININE 5.00*  --  5.03*  --   --   --  5.02*  --   --   --    < > = values in this interval not displayed.    Estimated Creatinine Clearance: 9 mL/min (A) (by C-G formula based on SCr of 5.02 mg/dL (H)).   Assessment: 24 YOM with  LV thrombus off Eliquis  due to hematuria, last dose 8/29 AM.  CBC stable and Pharmacy consulted to dose IV heparin .  CT also shows new polypoid thrombus and multiple splenic infarcts.  Confirmed weight of ~42 kg.   aPTT sub-therapeutic at 51 sec.  Heparin  level remains elevated due to Eliquis ' effect.  Hemoglobin trended down and patient is s/p transfusion.  Hematuria lighter per RN.  Goal of Therapy:  Heparin  level 0.3-0.5 units/ml aPTT 66-85 seconds d/t bleeding risk Monitor platelets by anticoagulation protocol: Yes   Plan:  Increase heparin  gtt to 1100 units/hr Check 8 hr aPTT Daily heparin  level, aPTT and CBC Monitor for hematuria resolution  Harlene Barlow, [employer], [facility] Clinical Pharmacist  01/01/2024 10:22 PM   [facility] pharmacy  phone numbers are listed on [email or web address]

## 2024-01-01 NOTE — Progress Notes (Signed)
 Patient is medically ready to leave the ICU, IMTS will resume care starting tomorrow, 9/2. Team notified.

## 2024-01-02 ENCOUNTER — Inpatient Hospital Stay (HOSPITAL_COMMUNITY): Payer: MEDICAID

## 2024-01-02 DIAGNOSIS — R579 Shock, unspecified: Secondary | ICD-10-CM | POA: Diagnosis not present

## 2024-01-02 DIAGNOSIS — N179 Acute kidney failure, unspecified: Secondary | ICD-10-CM | POA: Diagnosis not present

## 2024-01-02 DIAGNOSIS — I5043 Acute on chronic combined systolic (congestive) and diastolic (congestive) heart failure: Secondary | ICD-10-CM

## 2024-01-02 DIAGNOSIS — I5023 Acute on chronic systolic (congestive) heart failure: Secondary | ICD-10-CM | POA: Diagnosis not present

## 2024-01-02 DIAGNOSIS — N1832 Chronic kidney disease, stage 3b: Secondary | ICD-10-CM | POA: Diagnosis not present

## 2024-01-02 DIAGNOSIS — A419 Sepsis, unspecified organism: Secondary | ICD-10-CM | POA: Diagnosis not present

## 2024-01-02 DIAGNOSIS — R6521 Severe sepsis with septic shock: Secondary | ICD-10-CM | POA: Diagnosis not present

## 2024-01-02 LAB — BASIC METABOLIC PANEL WITH GFR
Anion gap: 16 — ABNORMAL HIGH (ref 5–15)
BUN: 100 mg/dL — ABNORMAL HIGH (ref 8–23)
CO2: 21 mmol/L — ABNORMAL LOW (ref 22–32)
Calcium: 8.3 mg/dL — ABNORMAL LOW (ref 8.9–10.3)
Chloride: 92 mmol/L — ABNORMAL LOW (ref 98–111)
Creatinine, Ser: 4.88 mg/dL — ABNORMAL HIGH (ref 0.61–1.24)
GFR, Estimated: 13 mL/min — ABNORMAL LOW (ref >60)
Glucose, Bld: 110 mg/dL — ABNORMAL HIGH (ref 70–99)
Potassium: 4.3 mmol/L (ref 3.5–5.1)
Sodium: 129 mmol/L — ABNORMAL LOW (ref 135–145)

## 2024-01-02 LAB — COMPREHENSIVE METABOLIC PANEL WITH GFR
ALT: 372 U/L — ABNORMAL HIGH (ref 0–44)
AST: 178 U/L — ABNORMAL HIGH (ref 15–41)
Albumin: 1.8 g/dL — ABNORMAL LOW (ref 3.5–5.0)
Alkaline Phosphatase: 43 U/L (ref 38–126)
Anion gap: 15 (ref 5–15)
BUN: 109 mg/dL — ABNORMAL HIGH (ref 8–23)
CO2: 19 mmol/L — ABNORMAL LOW (ref 22–32)
Calcium: 8 mg/dL — ABNORMAL LOW (ref 8.9–10.3)
Chloride: 93 mmol/L — ABNORMAL LOW (ref 98–111)
Creatinine, Ser: 5.05 mg/dL — ABNORMAL HIGH (ref 0.61–1.24)
GFR, Estimated: 12 mL/min — ABNORMAL LOW (ref >60)
Glucose, Bld: 107 mg/dL — ABNORMAL HIGH (ref 70–99)
Potassium: 4.2 mmol/L (ref 3.5–5.1)
Sodium: 127 mmol/L — ABNORMAL LOW (ref 135–145)
Total Bilirubin: 0.6 mg/dL (ref 0.0–1.2)
Total Protein: 7.4 g/dL (ref 6.5–8.1)

## 2024-01-02 LAB — COOXEMETRY PANEL
Carboxyhemoglobin: 1.4 % (ref 0.5–1.5)
Carboxyhemoglobin: 1.2 % (ref 0.5–1.5)
Methemoglobin: <0.7 % (ref 0.0–1.5)
Methemoglobin: <0.7 % (ref 0.0–1.5)
O2 Saturation: 88 %
O2 Saturation: 64.8 %
Total hemoglobin: 10.5 g/dL — ABNORMAL LOW (ref 12.0–16.0)
Total hemoglobin: 11 g/dL — ABNORMAL LOW (ref 12.0–16.0)

## 2024-01-02 LAB — CBC WITH DIFFERENTIAL/PLATELET
Abs Immature Granulocytes: 0.1 K/uL — ABNORMAL HIGH (ref 0.00–0.07)
Basophils Absolute: 0 K/uL (ref 0.0–0.1)
Basophils Relative: 0 %
Eosinophils Absolute: 0 K/uL (ref 0.0–0.5)
Eosinophils Relative: 0 %
HCT: 31 % — ABNORMAL LOW (ref 39.0–52.0)
Hemoglobin: 10.2 g/dL — ABNORMAL LOW (ref 13.0–17.0)
Immature Granulocytes: 1 %
Lymphocytes Relative: 2 %
Lymphs Abs: 0.4 K/uL — ABNORMAL LOW (ref 0.7–4.0)
MCH: 27.9 pg (ref 26.0–34.0)
MCHC: 32.9 g/dL (ref 30.0–36.0)
MCV: 84.9 fL (ref 80.0–100.0)
Monocytes Absolute: 0.6 K/uL (ref 0.1–1.0)
Monocytes Relative: 4 %
Neutro Abs: 13.5 K/uL — ABNORMAL HIGH (ref 1.7–7.7)
Neutrophils Relative %: 93 %
Platelets: 251 K/uL (ref 150–400)
RBC: 3.65 MIL/uL — ABNORMAL LOW (ref 4.22–5.81)
RDW: 16.8 % — ABNORMAL HIGH (ref 11.5–15.5)
WBC: 14.6 K/uL — ABNORMAL HIGH (ref 4.0–10.5)
nRBC: 0 % (ref 0.0–0.2)

## 2024-01-02 LAB — HEPARIN LEVEL (UNFRACTIONATED): Heparin Unfractionated: 0.74 [IU]/mL — ABNORMAL HIGH (ref 0.30–0.70)

## 2024-01-02 LAB — GLUCOSE, CAPILLARY
Glucose-Capillary: 60 mg/dL — ABNORMAL LOW (ref 70–99)
Glucose-Capillary: 87 mg/dL (ref 70–99)
Glucose-Capillary: 110 mg/dL — ABNORMAL HIGH (ref 70–99)
Glucose-Capillary: 114 mg/dL — ABNORMAL HIGH (ref 70–99)
Glucose-Capillary: 102 mg/dL — ABNORMAL HIGH (ref 70–99)
Glucose-Capillary: 96 mg/dL (ref 70–99)
Glucose-Capillary: 90 mg/dL (ref 70–99)

## 2024-01-02 LAB — CBC
HCT: 30.1 % — ABNORMAL LOW (ref 39.0–52.0)
Hemoglobin: 10.1 g/dL — ABNORMAL LOW (ref 13.0–17.0)
MCH: 28.1 pg (ref 26.0–34.0)
MCHC: 33.6 g/dL (ref 30.0–36.0)
MCV: 83.8 fL (ref 80.0–100.0)
Platelets: 231 K/uL (ref 150–400)
RBC: 3.59 MIL/uL — ABNORMAL LOW (ref 4.22–5.81)
RDW: 16.9 % — ABNORMAL HIGH (ref 11.5–15.5)
WBC: 8.3 K/uL (ref 4.0–10.5)
nRBC: 0 % (ref 0.0–0.2)

## 2024-01-02 LAB — PROCALCITONIN: Procalcitonin: 3.29 ng/mL

## 2024-01-02 LAB — APTT
aPTT: 57 s — ABNORMAL HIGH (ref 24–36)
aPTT: 82 s — ABNORMAL HIGH (ref 24–36)

## 2024-01-02 LAB — MAGNESIUM
Magnesium: 2.4 mg/dL (ref 1.7–2.4)
Magnesium: 2.2 mg/dL (ref 1.7–2.4)

## 2024-01-02 LAB — LACTIC ACID, PLASMA: Lactic Acid, Venous: 0.9 mmol/L (ref 0.5–1.9)

## 2024-01-02 LAB — PHOSPHORUS: Phosphorus: 4.6 mg/dL (ref 2.5–4.6)

## 2024-01-02 MED ORDER — SODIUM CHLORIDE 0.9 % IV SOLN
2.0000 g | INTRAVENOUS | Status: DC
  Administered 2024-01-02: 2 g via INTRAVENOUS
  Filled 2024-01-02: qty 20

## 2024-01-02 MED ORDER — PIPERACILLIN-TAZOBACTAM IN DEX 2-0.25 GM/50ML IV SOLN
2.2500 g | Freq: Three times a day (TID) | INTRAVENOUS | Status: DC
  Administered 2024-01-02 – 2024-01-03 (×3): 2.25 g via INTRAVENOUS
  Filled 2024-01-02 (×6): qty 50

## 2024-01-02 MED ORDER — ADENOSINE 6 MG/2ML IV SOLN
INTRAVENOUS | Status: AC
  Filled 2024-01-02: qty 4

## 2024-01-02 MED ORDER — LINEZOLID 600 MG/300ML IV SOLN
600.0000 mg | Freq: Two times a day (BID) | INTRAVENOUS | Status: DC
  Filled 2024-01-02: qty 300

## 2024-01-02 MED ORDER — ADENOSINE 6 MG/2ML IV SOLN: 6.0000 mg | Freq: Once | INTRAVENOUS | Status: AC

## 2024-01-02 MED ORDER — ACETAMINOPHEN 500 MG PO TABS
1000.0000 mg | ORAL_TABLET | Freq: Four times a day (QID) | ORAL | Status: DC
  Administered 2024-01-02 – 2024-01-08 (×22): 1000 mg via ORAL
  Filled 2024-01-02 (×22): qty 2

## 2024-01-02 MED ORDER — AMIODARONE HCL IN DEXTROSE 360-4.14 MG/200ML-% IV SOLN
INTRAVENOUS | Status: AC
  Administered 2024-01-02: 150 mg via INTRAVENOUS
  Filled 2024-01-02: qty 200

## 2024-01-02 MED ORDER — AMIODARONE HCL IN DEXTROSE 360-4.14 MG/200ML-% IV SOLN
60.0000 mg/h | INTRAVENOUS | Status: DC
  Administered 2024-01-02: 60 mg/h via INTRAVENOUS

## 2024-01-02 MED ORDER — AMIODARONE HCL IN DEXTROSE 360-4.14 MG/200ML-% IV SOLN: 30.0000 mg/h | INTRAVENOUS | Status: DC

## 2024-01-02 MED ORDER — OXYCODONE HCL 5 MG PO TABS
5.0000 mg | ORAL_TABLET | Freq: Once | ORAL | Status: AC
  Administered 2024-01-02: 5 mg via ORAL
  Filled 2024-01-02: qty 1

## 2024-01-02 MED ORDER — NOREPINEPHRINE 4 MG/250ML-% IV SOLN
0.0000 ug/min | INTRAVENOUS | Status: DC
  Filled 2024-01-02: qty 250

## 2024-01-02 MED ORDER — ACETAMINOPHEN 500 MG PO TABS
ORAL_TABLET | ORAL | Status: AC
  Administered 2024-01-02: 1000 mg via ORAL
  Filled 2024-01-02: qty 2

## 2024-01-02 MED ORDER — SODIUM CHLORIDE 0.9 % IV BOLUS
500.0000 mL | Freq: Once | INTRAVENOUS | Status: AC
  Administered 2024-01-02: 500 mL via INTRAVENOUS

## 2024-01-02 MED ORDER — ADENOSINE 6 MG/2ML IV SOLN
INTRAVENOUS | Status: AC
  Administered 2024-01-02: 6 mg via INTRAVENOUS
  Filled 2024-01-02: qty 2

## 2024-01-02 MED ORDER — AMIODARONE LOAD VIA INFUSION
150.0000 mg | Freq: Once | INTRAVENOUS | Status: AC
  Filled 2024-01-02: qty 83.34

## 2024-01-02 NOTE — Progress Notes (Signed)
 PHARMACY - ANTICOAGULATION CONSULT NOTE  Pharmacy Consult:  Heparin  Indication: LV thrombus  Allergies  Allergen Reactions   Cefepime  Other (See Comments)    Cefepime  neurotoxicity 11/07/23    Patient Measurements: Height: 6' (182.9 cm) Weight: 59 kg (130 lb 1.1 oz) IBW/kg (Calculated) : 77.6 HEPARIN  DW (KG): 42.1  Vital Signs: Temp: 98.3 F (36.8 C) (09/02 2000) Temp Source: Oral (09/02 2000) BP: 113/75 (09/02 2015) Pulse Rate: 117 (09/02 2030)  Labs: Recent Labs    12/30/23 2241 12/31/23 0400 01/01/24 0142 01/01/24 0457 01/01/24 1000 01/01/24 1328 01/01/24 2153 01/02/24 9367 01/02/24 0644 01/02/24 0645 01/02/24 1509 01/02/24 1849  HGB  --    < > 9.4* 9.8*  --  9.6*  --  10.1*  --   --  10.2*  --   HCT  --    < > 28.5* 28.9*  --  28.6*  --  30.1*  --   --  31.0*  --   PLT  --    < >  --  236  --   --   --  231  --   --  251  --   APTT 50*   < > 53*  --    < >  --  51*  --  57*  --   --  82*  HEPARINUNFRC >1.10*  --  >1.10*  --   --   --   --   --   --  0.74*  --   --   CREATININE  --    < >  --  5.02*  --   --   --  4.88*  --   --  5.05*  --    < > = values in this interval not displayed.    Estimated Creatinine Clearance: 12.3 mL/min (A) (by C-G formula based on SCr of 5.05 mg/dL (H)).   Assessment: 35 YOM with  LV thrombus off Eliquis  due to hematuria, last dose 8/29 AM.  CBC stable and Pharmacy consulted to dose IV heparin .  CT also shows new polypoid thrombus and multiple splenic infarcts.  Confirmed weight of ~42 kg.   Heparin  drip 1200 uts/hr with aptt 82sec -at goal.   Heparin  level remains elevated due to Eliquis ' effect.  Hgb 10.1 s/p transfusions (last 9/1).  Hematuria improving per RN.  Goal of Therapy:  Heparin  level 0.3-0.5 units/ml aPTT 66-85 seconds d/t bleeding risk Monitor platelets by anticoagulation protocol: Yes   Plan:  Continue heparin  gtt 1200 units/hr Daily heparin  level, aPTT and CBC Monitor for hematuria  resolution    Olam Chalk Pharm.D. CPP, BCPS Clinical Pharmacist [phone] 01/02/2024 8:38 PM

## 2024-01-02 NOTE — Progress Notes (Signed)
 HD#[id number] SUBJECTIVE:  Patient Summary: Derek Thompson is a 63 y.o. 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 Since admission he underwent right BKA on 11/17/2023, treated for suspected catheter associated UTI from 7/22 - 7/27, treated for hypovolemic hyponatremia, had on and off issues with low blood pressures requiring intermittent IV fluid boluses and continuous IV fluids, and developed an AKI on CKD stage IIIa with further workup showing multiple myeloma positive on bone marrow biopsy.  After being medically stable for discharge in early August he was unable to find an accepting skilled nursing facility and did not have a safe discharge due to being unhoused.  He was [facility] in the [facility].  On 8/29 he developed new fever, hematuria, decreased consciousness, acute hyponatremia, and septic versus cardiogenic shock.  He was transferred to the ICU after being placed on vasopressors, broad-spectrum antibiotics, and dobutamine .  Vasopressors stopped at 5 AM on 9/1.  Remains on dobutamine  and Zosyn  and was transferred out of the ICU on 9/2.   Overnight Events: The patient was transferred out of the ICU and back to IMTS service  Interim History: The patient was seen and evaluated at the bedside this morning. He reports that he is feeling better, but the RN was at the bedside reporting that he had suprapubic pain. Mr Tavis elaborated stating that he felt the urge to urinate but was unable to   OBJECTIVE:  Vital Signs: Vitals:   01/01/24 1615 01/01/24 2019 01/01/24 2331 01/02/24 0349  BP: 114/70 106/77 115/70 129/80  Pulse: 98  (!) 123   Resp: 14 19 16 13   Temp:  98.5 F (36.9 C) 98.8 F (37.1 C) 99.5 F  (37.5 C)  TempSrc:  Oral Oral Oral  SpO2: 100% 100% 100% 98%  Weight:    59 kg  Height:       Supplemental O2: Room Air SpO2: 98 % O2 Flow Rate (L/min): 2 L/min  Filed Weights   12/30/23 1335 01/01/24 0600 01/02/24 0349  Weight: 42.1 kg 42.7 kg 59 kg     Intake/Output Summary (Last 24 hours) at 01/02/2024 0753 Last data filed at 01/02/2024 0433 Gross per 24 hour  Intake 180.78 ml  Output 2050 ml  Net -1869.22 ml   Net IO Since Admission: -49,600.85 mL [01/02/24 0753]  Physical Exam: Const:: Awake, alert in NAD HENT: Normocephalic, atraumatic, mucus membranes moist Card: tachycardic with regular rhythm, No MRG, No pitting edema on LE's bilaterally  Resp: LCTAB, no increased work of breathing Abd: Soft, NTND, Bsx4 Extremities: Warm, pink   Patient Lines/Drains/Airways Status     Active Line/Drains/Airways     Name Placement date Placement time Site Days   Peripheral IV 12/14/23 22 G 1 Left;Lateral;Posterior Forearm 12/14/23  0747  Forearm  19   CVC Triple Lumen 12/29/23 Right Internal jugular 12/29/23  1300  -- 4   Urethral Catheter Barnie Ronde, RN Coude 16 Fr. 12/29/23  0925  Coude  4   Wound 11/17/23 1030 Surgical Closed Surgical Incision Knee Right 11/17/23  1030  Knee  46            Pertinent labs and imaging:      Latest Ref Rng & Units 01/02/2024    6:32  AM 01/01/2024    1:28 PM 01/01/2024    4:57 AM  CBC  WBC 4.0 - 10.5 K/uL 8.3   9.0   Hemoglobin 13.0 - 17.0 g/dL 89.8  9.6  9.8   Hematocrit 39.0 - 52.0 % 30.1  28.6  28.9   Platelets 150 - 400 K/uL 231   236        Latest Ref Rng & Units 01/02/2024    6:32 AM 01/01/2024    4:57 AM 12/31/2023    4:00 AM  CMP  Glucose 70 - 99 mg/dL 889  870  88   BUN 8 - 23 mg/dL 899  887  872   Creatinine 0.61 - 1.24 mg/dL 5.11  4.97  4.96   Sodium 135 - 145 mmol/L 129  126  123   Potassium 3.5 - 5.1 mmol/L 4.3  4.1  4.1   Chloride 98 - 111 mmol/L 92  91  90   CO2 22 - 32 mmol/L 21  22  21    Calcium  8.9 - 10.3  mg/dL 8.3  8.3  8.1     No results found.  ASSESSMENT/PLAN:  Assessment: Principal Problem:   Osteomyelitis of right foot (HCC) Active Problems:   CAD (coronary artery disease)   Hyperkalemia   AKI (acute kidney injury) (HCC)   Anemia   LV (left ventricular) mural thrombus without MI (HCC)   Gangrene of right foot (HCC)   Hyponatremia   HFrEF (heart failure with reduced ejection fraction) (HCC)   Chronic osteomyelitis involving lower leg, right (HCC)   Frailty   Urinary retention   Generalized weakness   Sinus tachycardia   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 (HCC)   Chronic anemia   Hypoalbuminemia   Monoclonal gammopathy   Hx of right BKA (HCC)   Moderate protein-calorie malnutrition (HCC)   Plan: #Mixed septic and cardiogenic shock #Suspected UTI -The patient was transferred to the ICU on 8/29 after being profoundly hypotensive and tachycardic -Suspected mixed septic and cardiogenic shock. Now resolved -Suspect urosepsis due to chronic indwelling catheter, however urine culture x2 are negative.  -Pt was initiated on zosyn  and linezolid --> this was titrated down to zosyn  with negative blood cultures (MRSE in 1 tube, likely contaminant) -Will titrate abx to rocephin  today to continue 7 day course (ending 9/4) due to negative culture and no known history of resistant organisms.   #HFrEF #Biventricular heart failure #Acute on Chronic Systolic HF #LV Thrombus -Echo on 8/29 with EF ~13% and noted large LV thrombus -Contributing to cardiogenic shock -Heart Failure team following, appreciate their recommendations -Continue dobutamine  2.5mcg until creatinine begins significantly trending down -Continue heparin  gtt with plan to eventually switch back to eliquis .  -On exam, pt not overtly volume overloaded. Continue fluid restriction.   #Hematuria -Hematuria intermittent throughout hospitalization, however persistent since Friday afternoon  (8/29) -Greatly improved -Hgb 10.1 today, improved from yesterday -Continue heparin  gtt -Pt did note suprapubic pain and the feeling of needing to urinate without being able to today. Foley flushed and irrigated by RN with small clot removed. Foley now draining properly. Will continue to keep close watch   #Hyponatremia -Hyponatremia persistent since admission. Likely multifactorial including pseudohyponatremia in setting of monoclonal gammopathy.  -Continue fluid restriction  -Improved today  #AKI on CKD -Worsening Scr over the past weeks. Peaked at 5.03 in the ICU. -Today beginning to trend down at 4.88. Suspect resolving shock and improved perfusion. -Daily RFP's  -Continue dobutamine  until  significant downtrend in Scr per cardiology.   #Monoclonal Gammopathy -New this admission. Likely multiple myeloma per BM biopsy -Pt to follow up OP  #Chronic Osteomyelitis #S/p R BKA 7/18 -Pt with hx of chronic osteomyelitis and underwent BKA with Dr. Harden on 7/18.  -Incisional site well healed without erythema or drainage.  -Continue to work with PT as able.    Best Practice: Diet: Cardiac diet IVF: Fluids: None, Rate: None VTE: Heparin  gtt Code: Full  Disposition planning: Therapy Recs: SNF, DME: walker and wheelchair DISPO: Anticipated discharge pending to Skilled nursing facility pending clinical improvement.  Signature:  Schuyler Novak, DO [facility]  7:53 AM, 01/02/2024  On Call pager [phone]

## 2024-01-02 NOTE — Progress Notes (Signed)
 Called to the bedside due to pt HR in the 150's sustained. Upon arrival, RN at bedside obtaining EKG and noted increasing temperature. Pt A&Ox4  Rapid response called. Adenosine  6mg  given with no change in rate or rhythm. Amio 150mg  bolus given with mild decrease in rate. Pt with fever of 102 axillary and rates in low 130's. Per cardiology, dobutamine  stopped during episode. Will repeat amiodarone  bolus if pt's rate increases yet again.   Will repeat blood cultures, urine cultures, and broaden antibiotics to linezolid  and zosyn .

## 2024-01-02 NOTE — Progress Notes (Signed)
 Physical Therapy Treatment Patient Details Name: Derek Thompson MRN: [id number] DOB: Nov 05, 1960 Today's Date: 01/02/2024   History of Present Illness Pt is a 63 y/o M presenting to ED on 11/02/23 with lethargy. Pt found to be tachycardic, hyperkalemic, and hyponatremic. 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 Pt transferred to icu on 8/29. 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    PT Comments  [facility] PT/OT Session: Pt has been transferred back to progressive care unit, and is reporting increased fatigue from not being able to get any sleep. Pt reports being agreeable to do whatever therapy wants to do but request to return to bed afterwards. Pt found to have leaking catheter, with coming to EoB. Agreeable to have him stand for cleaning and change of bed pad. Pt requires modAx2 for standing and is able to stand with modA for ~3 min for pericare. Pt sits on EoB looking exhausted. PT/OT provided totalAx2 pad scoot towards HoB and pt returned to supine. D/c plans remain appropriate. PT will continue to follow acutely.    If plan is discharge home, recommend the following: A lot of help with bathing/dressing/bathroom;Supervision due to cognitive status;Help with stairs or ramp for entrance;Assistance with cooking/housework;Assist for transportation;A little help with walking and/or transfers   Can travel by private vehicle     Yes  Equipment Recommendations  BSC/3in1;Wheelchair (measurements PT);Wheelchair cushion (measurements PT);Other (comment)       Precautions / Restrictions Precautions Precautions: Fall;Other (comment) Recall of Precautions/Restrictions: Impaired Precaution/Restrictions Comments: R BKA- no pillows under knee Required Braces or Orthoses: Other  Brace Other Brace: prosthetist request use of limb protector in bed to keep knee extension at night Restrictions Weight Bearing Restrictions Per Provider Order: No RLE Weight Bearing Per Provider Order: Non weight bearing Other Position/Activity Restrictions: shrinker     Mobility  Bed Mobility Overal bed mobility: Needs Assistance Bed Mobility: Supine to Sit     Supine to sit: Used rails, HOB elevated, Mod assist     General bed mobility comments: max directional verbal cues, delayed response time, modA to scoot to EOB and trunk elevation    Transfers Overall transfer level: Needs assistance Equipment used: Rolling walker (2 wheels) Transfers: Sit to/from Stand, Bed to chair/wheelchair/BSC Sit to Stand: From elevated surface, Mod assist, +2 physical assistance Stand pivot transfers: Mod assist, +2 physical assistance, +2 safety/equipment, From elevated surface        Lateral/Scoot Transfers: Total assist General transfer comment: modAx2 for coming to standing for pericare, total Ax2 for pad scoot of pt towards HoB    Ambulation/Gait               General Gait Details: unable this date due to diarrhea         Balance Overall balance assessment: Needs assistance Sitting-balance support: No upper extremity supported, Feet supported Sitting balance-Leahy Scale: Good Sitting balance - Comments: able to perform self care tasks seated on EOB without LOB   Standing balance support: Bilateral upper extremity supported, During functional activity Standing balance-Leahy Scale: Poor Standing balance comment: pt able to stand with outside assist ~3 min for pericare                            Communication Communication Communication:  Impaired Factors Affecting Communication: Difficulty expressing self  Cognition Arousal: Alert, Lethargic Behavior During Therapy: Flat affect   PT - Cognitive impairments: Problem solving                        PT - Cognition Comments: slowed responses, reports increased fatigue Following commands: Impaired Following commands impaired: Follows one step commands with increased time    Cueing Cueing Techniques: Verbal cues, Gestural cues, Tactile cues     General Comments General comments (skin integrity, edema, etc.): HR in 120 at rest, low 130s with standing      Pertinent Vitals/Pain Pain Assessment Pain Assessment: Faces Faces Pain Scale: Hurts little more Pain Location: bladder pain, greater in standing Pain Descriptors / Indicators: Grimacing, Tiring Pain Intervention(s): Limited activity within patient's tolerance, Monitored during session, Repositioned     PT Goals (current goals can now be found in the care plan section) Acute Rehab PT Goals Patient Stated Goal: to get to rehab PT Goal Formulation: With patient Time For Goal Achievement: 01/30/24 Potential to Achieve Goals: Good Progress towards PT goals: Not progressing toward goals - comment (limited by fatigue)    Frequency    Min 2X/week           Co-evaluation PT/OT/SLP Co-Evaluation/Treatment: Yes Reason for Co-Treatment: To address functional/ADL transfers (decreased activity tolerance) PT goals addressed during session: Mobility/safety with mobility        AM-PAC PT 6 Clicks Mobility   Outcome Measure  Help needed turning from your back to your side while in a flat bed without using bedrails?: A Lot Help needed moving from lying on your back to sitting on the side of a flat bed without using bedrails?: A Lot Help needed moving to and from a bed to a chair (including a wheelchair)?: A Lot Help needed standing up from a chair using your arms (e.g., wheelchair or bedside chair)?: A Lot Help needed to walk in hospital room?: A Lot Help needed climbing 3-5 steps with a railing? : Total 6 Click Score: 11    End of Session Equipment Utilized During Treatment: Gait belt Activity Tolerance: Patient limited  by fatigue Patient left: with call bell/phone within reach;in chair;with chair alarm set Nurse Communication: Mobility status PT Visit Diagnosis: Unsteadiness on feet (R26.81);Difficulty in walking, not elsewhere classified (R26.2);Other abnormalities of gait and mobility (R26.89);Muscle weakness (generalized) (M62.81) Pain - Right/Left: Right Pain - part of body: Leg     Time: 8893-8870 PT Time Calculation (min) (ACUTE ONLY): 23 min  Charges:    $Therapeutic Activity: 8-22 mins PT General Charges $$ ACUTE PT VISIT: 1 Visit                     Jeremaine Maraj B. [name] PT, DPT Acute Rehabilitation Services Please use secure chat or  Call Office [phone]    [name] Fleeta [facility] 01/02/2024, 12:13 PM

## 2024-01-02 NOTE — Progress Notes (Addendum)
 Advanced Heart Failure Progress Update  Notified by Dr. Rolan that patient had gone into SVT with heart rate in the 150-160s. Had previously been in ST in the 120-130s throughout the morning. EKG this am performed showing ST 122 bpm with LBBB. On arrival to patients room, primary team at bedside. BP stable. Patient was febrile with chills, temp 102.70F. EKG at bedside showed wide QRS tachycardia, difficult to discern SVT from ST with previously low voltage P waves, LBBB, and patient shivering. V rate never went above 160.   Dobutamine  stopped. Zoll and pads placed, adenosine  6 mg given without change in rhythm or rate. Given amio bolus and started on amio drip, would not drive HR down too low with concern that this may be compensatory. Given IV tylenol . Discussed with primary team that based on clinical picture, as well as co-ox from this morning, patient is progressing back into sepsis, antibiotic coverage may need to be adjusted. Labs repeated. Temp rechecked 103.20F axillary. Critical care team called and made aware that patient is not doing well and will likely need ICU level of care if he does not improve.   [place] Branden Shallenberger, NP 01/02/24, 5:03 PM  Advanced Heart Failure Team Pager [phone] (M-F; 7a - 5p)  Please contact [facility] for night-coverage after hours (4p -7a ) and weekends on [email or web address]

## 2024-01-02 NOTE — Progress Notes (Addendum)
 PT triggering YELLOW MEWS driven by tachycardia 110-120s.  Pt did have suprapubic pain early for which pain meds and foley flushing did help relieve it. Primary team did order 12 lead ECG and reviewed it at bedside. No new orders.     01/02/24 0838  Assess: MEWS Score  Temp 98.2 F (36.8 C)  BP (!) 126/93  MAP (mmHg) 103  Pulse Rate (!) 122  ECG Heart Rate (!) 124  Resp 19  Level of Consciousness Alert  SpO2 100 %  Patient Activity (if Appropriate) In bed  Assess: MEWS Score  MEWS Temp 0  MEWS Systolic 0  MEWS Pulse 2  MEWS RR 0  MEWS LOC 0  MEWS Score 2  MEWS Score Color Yellow  Assess: if the MEWS score is Yellow or Red  Were vital signs accurate and taken at a resting state? Yes  Does the patient meet 2 or more of the SIRS criteria? No  Does the patient have a confirmed or suspected source of infection? No  MEWS guidelines implemented  No, previously yellow, continue vital signs every 4 hours  Assess: SIRS CRITERIA  SIRS Temperature  0  SIRS Respirations  0  SIRS Pulse 1  SIRS WBC 0  SIRS Score Sum  1

## 2024-01-02 NOTE — Progress Notes (Addendum)
 Patient ID: Derek Thompson, male   DOB: November 14, 1960, 63 y.o.   MRN: [id number]     Advanced Heart Failure Rounding Note  Cardiologist: Vinie JAYSON Maxcy, MD  Chief Complaint: Shock Subjective:    Pain around foley this morning.   Continues dobutamine  2.5.  Co-ox 73%, CVP 4.  I/Os even with good UOP.  MAP stable. NSR 90s.   BUN/creatinine remain elevated but stable, 127/5.03 => 112/5.02 => 100/4.88, Na 121 => 123 => 126 => 129.    WBCs 17.9 => 9.6 => 9 => 6.3. He is on Zosyn . Suspect urinary source for septic shock. Low grade fever to 99.5, PCT 3.29 though WBCs lower.   Still has hematuria but has improved some, hgb 10.1.  Back on heparin  gtt.     Objective:   Weight Range: 59 kg Body mass index is 17.64 kg/m.   Vital Signs:   Temp:  [97.5 F (36.4 C)-99.5 F (37.5 C)] 99.5 F (37.5 C) (09/02 0349) Pulse Rate:  [98-123] 123 (09/01 2331) Resp:  [10-23] 13 (09/02 0349) BP: (103-129)/(65-88) 129/80 (09/02 0349) SpO2:  [91 %-100 %] 98 % (09/02 0349) Weight:  [59 kg] 59 kg (09/02 0349) Last BM Date : 01/01/24  Weight change: Filed Weights   12/30/23 1335 01/01/24 0600 01/02/24 0349  Weight: 42.1 kg 42.7 kg 59 kg    Intake/Output:   Intake/Output Summary (Last 24 hours) at 01/02/2024 0759 Last data filed at 01/02/2024 0433 Gross per 24 hour  Intake 180.78 ml  Output 2050 ml  Net -1869.22 ml      Physical Exam    General: Mild distress with foley pain Neck: No JVD, no thyromegaly or thyroid nodule.  Lungs: Clear to auscultation bilaterally with normal respiratory effort. CV: Lateral PMI.  Heart mildly tachy regular S1/S2, no S3/S4, no murmur.  No peripheral edema.   Abdomen: Soft, nontender, no hepatosplenomegaly, no distention.  Skin: Intact without lesions or rashes.  Neurologic: Alert and oriented x 3.  Psych: Normal affect. Extremities: No clubbing or cyanosis. Right BKA HEENT: Normal.   Telemetry   NSR 110s (personally reviewed)  Labs    CBC Recent  Labs    01/01/24 0457 01/01/24 1328 01/02/24 0632  WBC 9.0  --  8.3  HGB 9.8* 9.6* 10.1*  HCT 28.9* 28.6* 30.1*  MCV 83.5  --  83.8  PLT 236  --  231   Basic Metabolic Panel Recent Labs    90/98/74 0457 01/02/24 0632  NA 126* 129*  K 4.1 4.3  CL 91* 92*  CO2 22 21*  GLUCOSE 129* 110*  BUN 112* 100*  CREATININE 5.02* 4.88*  CALCIUM  8.3* 8.3*  MG 2.2 2.4  PHOS 5.1* 4.6   Liver Function Tests No results for input(s): AST, ALT, ALKPHOS, BILITOT, PROT, ALBUMIN in the last 72 hours.  No results for input(s): LIPASE, AMYLASE in the last 72 hours. Cardiac Enzymes No results for input(s): CKTOTAL, CKMB, CKMBINDEX, TROPONINI in the last 72 hours.   BNP: BNP (last 3 results) Recent Labs    06/29/23 0447 11/02/23 1552 11/03/23 0824  BNP 3,373.6* 4,136.5* 3,980.4*    ProBNP (last 3 results) No results for input(s): PROBNP in the last 8760 hours.   D-Dimer No results for input(s): DDIMER in the last 72 hours. Hemoglobin A1C No results for input(s): HGBA1C in the last 72 hours. Fasting Lipid Panel No results for input(s): CHOL, HDL, LDLCALC, TRIG, CHOLHDL, LDLDIRECT in the last 72 hours. Thyroid Function Tests No  results for input(s): TSH, T4TOTAL, T3FREE, THYROIDAB in the last 72 hours.  Invalid input(s): FREET3  Other results:   Imaging    No results found.    Medications:     Scheduled Medications:  sodium chloride    Intravenous Once   aspirin   81 mg Oral Daily   atorvastatin   80 mg Oral Daily   Chlorhexidine  Gluconate Cloth  6 each Topical Daily   ezetimibe   10 mg Oral Daily   feeding supplement  237 mL Oral BID BM   insulin  aspart  0-9 Units Subcutaneous Q4H   insulin  glargine  5 Units Subcutaneous Daily   lidocaine   1 Application Urethral Once   mupirocin  ointment   Nasal BID   oxyCODONE   5 mg Oral Once    Infusions:  DOBUTamine  2.5 mcg/kg/min (01/01/24 1600)   heparin  1,100 Units/hr  (01/02/24 0417)   norepinephrine  (LEVOPHED ) Adult infusion Stopped (01/01/24 0548)   piperacillin -tazobactam (ZOSYN )  IV 2.25 g (01/02/24 0624)    PRN Medications: acetaminophen , mouth rinse, polyethylene glycol    Assessment/Plan   1. Shock: Suspect mixed septic/cardiogenic.  Echo with EF < 20%, LV thrombus, moderate RV dysfunction.  Now on dobutamine  2.5 with co-ox ?88%.  CVP is not set up this morning but not volume overloaded on exam. Good UOP.  - Follow CVP.  - Not markedly volume overloaded and BUN/creatinine very high, no Lasix  at this time.   - Continue dobutamine  2.5 as we allow creatinine to trend down. .  - Broad spectrum abx with Zosyn .  2. Acute on chronic systolic CHF: Echo with EF < 20%, LV thrombus, moderate RV dysfunction.  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  Last cath in 6/24 showed 70% proximal LCx stenosis. Developed shock, now resolved, see plan above.  3.  Gross hematuria: Suspect due to chronic foley + UTI. Improving, hgb 10.1 today.  Has pain around foley today.  - Flush foley.  - Will give dose of oxycodone  for pain control.  - Continue heparin  for now with LV thrombus.   4. 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   - Heparin  restarted, eventual Eliquis .     5. Multiple myeloma: New diagnosis this admission.  6. 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 7. ID: Suspect septic shock as above, probably from UTI/urosepsis. Low grade fever to 99.5, PCT 3.29 though WBCs lower. Hard to know what to make of PCT with renal failure but it is higher than 4 days ago.  Co-ox also high at 88%.  - Continue Zosyn  per primary team but would reculture and follow closely.  8. AKI on CKD stage 3: Baseline creatinine 1.5. BUN/creatinine stabilized today at 127/5.03 => 112/5.02 => 100/4.88.  Suspect due to hypotension/shock.  - Follow closely, gradually improving.  - Would continue dobutamine  for now until we see further downtrend in creatinine.  9.  Hyponatremia: Na rising 121 => 123 => 126 => 129.     - Fluid restrict   Length of Stay: 60  Ezra Shuck, MD  01/02/2024, 7:59 AM  Advanced Heart Failure Team Pager [phone] (M-F; 7a - 5p)  Please contact [facility] for night-coverage after hours (5p -7a ) and weekends on [email or web address]

## 2024-01-02 NOTE — Progress Notes (Signed)
 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 His course has been significant for suspected cathter associated UTI s/p IV antibiotics from 7/22-7/27. He also developed AKI on CKD and further work-up revealed multiple myeloma on bone marrow biopsy. In early August he was cleared for hospital discharge but was unable to find an accepting facility and did not have a safe discharge dispo due to homelessness. He was enrolled in the [facility]. On 8/29 he was transferred to the ICU for somnolence, hypotension and new fever with concern for mixed septic and cardiogenic shock. He was briefly vasopressors, started on dobutamine  and resumed on IV Zosyn  with presumed urinary source of infection. He was transferred to the floor on 9/1 and IMTS resumed care 9/2. He subsequently developed new fever, tachycardia and hypotension prompting PCCM consult and return to ICU.   Pertinent  Medical History  Heart failure with reduced EF, baseline in the 20 range, urinary retention secondary to history of prostate cancer with chronic Foley, chronic right lower extremity osteomyelitis, CKD stage IIIb.  Significant Hospital Events: Including procedures, antibiotic start and stop dates in addition to other pertinent events   7/4 Admitted 7/17 Bone marrow biopsy 7/18 s/p R BKA 8/27-8/28 rising BUN and Cr 8/28 new fever, hematuria, AMS and shock requiring NE, echocardiogram showing EF<20% and moderate RV dysfunction, started on  dobutamine   8/29   Interim History / Subjective:  Frustrated with multiple set backs. No complaint of lightheadedness or dizziness. Denies chills, nausea, vomiting or abdominal pain Developed wide complex tachyarrhythmia and received 6 mg adenosine  and amiodarone  bolus and drip  Objective    Blood pressure 112/78, pulse (!) 131, temperature (!) 103.6 F (39.8 C), temperature source Axillary, resp. rate (!) 25, height 6' (1.829 m), weight 59 kg, SpO2 99%. CVP:  [4 mmHg-5 mmHg] 4 mmHg      Intake/Output Summary (Last 24 hours) at 01/02/2024 1629 Last data filed at 01/02/2024 0433 Gross per 24 hour  Intake --  Output 1250 ml  Net -1250 ml   Filed Weights   12/30/23 1335 01/01/24 0600 01/02/24 0349  Weight: 42.1 kg 42.7 kg 59 kg    Examination: General: thin male, no acute distress HENT: strong voice, no secretions Lungs: clear to auscultation bilaterally Cardiovascular: tachycardic, normal S1 and S2  Abdomen: soft, non-tender, non-distended Extremities: s/p right BKA incision well healing without erythema or drainage, no edema Neuro: awake, alert and oriented, non-focal GU: blood tinged urine and clots in foley   Resolved problem list   Assessment and Plan   Mixed septic and cardiogenic shock MRSE in 1 of 2 blood culture bottles, suspect contaminant Acute on chronic systolic heart failure Current decompensation most likely septic shock given new fever 101.3, PCT increased to 3.29 from 0.86. Most likely source would be urinary, recent CT AP on 8/29 demonstrated bilateral hydronephrosis and hydroureter concerning for infectious cystitis with bilateral ascending urinary tract infection. Possible cardiogenic component given EF ~13% and moderate RV on most recent Echo.  Was on dobutamine  which was stopped this afternoon in the setting of wide QRS tachycardia. Co-ox this afternoon 64.8. Tachyarrhythmia may also be contributing to current hypotension.  - Levophed  as needed to maintain  MAP>65 - Check CVP and consider gentle IV fluid resuscitation - Continue to trend Co-ox - Check lactic acid - Follow up urine culture and blood cultures - Stop linezolid  and continue zosyn , low threshold to broaden to meropenem  - Consider exchanging foley catheter although it was just placed on 8/29 - Continue to hold BB in the setting of acute shock - Hold ASA in the setting of hematuria - Hold statin given transaminitis  - Advanced heart failure team following, appreciate recommendations - Patient has prolonged hospital course and significant comorbidity, will re-engage palliative care   Known LV thrombus Previously on Eliquis .  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, due to monoclonal gammopathy  History of urinary retention and prostate cancer with chronic foley Baseline creatinine felt to be in the mid 2-3 range. Cr had been improving on dobutamine  drip. Appears dry on exam.  - Check CVP and gentle fluid resuscitation as indicated by hemodynamic markers of perfusion  - Check CMP now and continue to trend - Avoid nephrotoxins, renally dose medications and ensure adequate renal perfusion  Hematuria Acute on chronic anemia Apparently this has been on and off through the hospitalization.  Was slightly better off anticoagulation, now present again also with clots, difficult to flush and leaking around foley. - Will consult urology - Check H/H and if dropping consider stopping heparin  drip, hold ASA per above  Acute on chronic hyponatremia, improving Likely multifactorial including pseuohyponatremia in the setting of monoclonal gammopathy.  - For crystalloid administration, follow sodium. Drifting back up on its own now that he has adequate urine output - Continue to support hemodynamics - Would only consider hypertonic saline if he became obtunded or sodium dropped below 120  Type 2 diabetes with hyperglycemia - Continue SSI  and Lantus   Hypoalbuminemia Protein calorie malnutrition  - Supportive care, nutritional support  Chronic osteomyelitis s/p R BKA (7/18)  Deconditioning - PT/OT when appropriate   Elevated protein gap with monoclonal gammopathy and suspected multiple myeloma from biopsy on 7/17 - Plan for outpatient follow-up   Labs   CBC: Recent Labs  Lab 12/29/23 0926 12/29/23 1323 12/30/23 1601 12/31/23 0400 12/31/23 1400 01/01/24 0142 01/01/24 0457 01/01/24 1328 01/02/24 0632  WBC 18.0*   < > 14.4* 9.6 6.1  --  9.0  --  8.3  NEUTROABS 16.7*  --   --   --   --   --   --   --   --   HGB 8.0*   < > 7.1* 6.8* 6.2* 9.4* 9.8* 9.6* 10.1*  HCT 24.5*   < > 21.3* 20.7* 19.2* 28.5* 28.9* 28.6* 30.1*  MCV 84.2   < > 84.2 84.8 88.1  --  83.5  --  83.8  PLT 292   < > 230 226 177  --  236  --  231   < > = values in this interval not displayed.    Basic Metabolic Panel: Recent Labs  Lab 12/27/23 0439 12/28/23 0531 12/29/23 0422 12/29/23 1200 12/30/23 1011 12/30/23 2009 12/31/23 0400 01/01/24 0457 01/02/24 0632  NA 126* 130* 121*   < > 122* 120* 123* 126* 129*  K 5.3* 4.8 5.1   < > 4.5 4.3 4.1 4.1 4.3  CL 87* 91* 84*   < > 87* 87* 90* 91* 92*  CO2 26 27 20*   < > 21* 22 21* 22 21*  GLUCOSE 108* 102* 183*   < > 116* 124* 88 129* 110*  BUN 109* 116* 126*   < > 127* 130* 127* 112* 100*  CREATININE 3.90* 3.48* 3.96*   < > 4.81* 5.00* 5.03* 5.02* 4.88*  CALCIUM  8.9 9.2 8.8*   < > 8.2* 8.0* 8.1* 8.3* 8.3*  MG  --   --   --   --   --   --   --  2.2 2.4  PHOS 4.4 4.7* 3.5  --   --   --   --  5.1* 4.6   < > = values in this interval not displayed.   GFR: Estimated Creatinine Clearance: 12.8 mL/min (A) (by C-G formula based on SCr of 4.88 mg/dL (H)). Recent Labs  Lab 12/29/23 1033 12/29/23 1231 12/29/23 1323 12/29/23 1549 12/29/23 2135 12/30/23 0332 12/30/23 1047 12/30/23 1601 12/31/23 0400 12/31/23 1400 01/01/24 0457 01/02/24 9367  PROCALCITON  --  0.86  --   --   --   --   --    --   --   --   --  3.29  WBC  --   --    < >  --   --    < >  --    < > 9.6 6.1 9.0 8.3  LATICACIDVEN 3.5*  --   --  6.6* 1.7  --  1.4  --   --   --   --   --    < > = values in this interval not displayed.    Liver Function Tests: Recent Labs  Lab 12/27/23 0439 12/28/23 0531 12/29/23 0422 12/29/23 0926  AST  --   --   --  28  ALT  --   --   --  23  ALKPHOS  --   --   --  51  BILITOT  --   --   --  0.8  PROT  --   --   --  8.3*  ALBUMIN 2.1* 2.2* 2.3* 2.3*   No results for input(s): LIPASE, AMYLASE in the last 168 hours. No results for input(s): AMMONIA in the last 168 hours.  ABG    Component Value Date/Time   PHART 7.488 (H) 12/29/2023 1620   PCO2ART 20.9 (L) 12/29/2023 1620   PO2ART 98 12/29/2023 1620   HCO3 15.9 (L) 12/29/2023 1620   TCO2 17 (L) 12/29/2023 1620   ACIDBASEDEF 7.0 (H) 12/29/2023 1620   O2SAT 64.8 01/02/2024 1533     Coagulation Profile: No results for input(s): INR, PROTIME in the last 168 hours.  Cardiac Enzymes: Recent Labs  Lab 12/29/23 1200  CKTOTAL 257  CKMB 27.4*    HbA1C: Hgb A1c MFr Bld  Date/Time Value Ref Range Status  09/02/2023 06:15 AM 8.7 (H) 4.8 - 5.6 % Final    Comment:    (NOTE) Pre diabetes:          5.7%-6.4%  Diabetes:              >6.4%  Glycemic control for   <7.0% adults with diabetes   06/13/2023 07:40 PM 13.3 (H) 4.8 - 5.6 % Final    Comment:    (NOTE) Pre diabetes:          5.7%-6.4%  Diabetes:              >  6.4%  Glycemic control for   <7.0% adults with diabetes     CBG: Recent Labs  Lab 01/01/24 2334 01/02/24 0346 01/02/24 0416 01/02/24 0903 01/02/24 1106  GLUCAP 88 60* 87 110* 114*        The patient is critically ill with multiple organ system failure and requires high complexity decision making for assessment and support, frequent evaluation and titration of therapies, advanced monitoring, review of radiographic studies and interpretation of complex data.   Critical Care  Time devoted to patient care services, exclusive of separately billable procedures, described in this note is 35 minutes.  Rexene LOISE Blush, PA-C [facility] 01/02/24 5:44 PM  Please see [email or web address] for pager details.  From 7A-7P if no response, please call [phone] After hours, please call ELink [phone]

## 2024-01-02 NOTE — Inpatient Diabetes Management (Signed)
 Inpatient Diabetes Program Recommendations  AACE/ADA: New Consensus Statement on Inpatient Glycemic Control (2015)  Target Ranges:  Prepandial:   less than 140 mg/dL      Peak postprandial:   less than 180 mg/dL (1-2 hours)      Critically ill patients:  140 - 180 mg/dL   Lab Results  Component Value Date   GLUCAP 114 (H) 01/02/2024   HGBA1C 8.7 (H) 09/02/2023    Review of Glycemic Control  Latest Reference Range & Units 01/02/24 03:46 01/02/24 04:16 01/02/24 09:03 01/02/24 11:06  Glucose-Capillary 70 - 99 mg/dL 60 (L) 87 889 (H) 885 (H)  (L): Data is abnormally low (H): Data is abnormally high  Please consider decreasing correction to Novolog  0-6 units Q4H.      Discharge Recommendations: Long acting recommendations: Insulin  Glargine (LANTUS ) Solostar Pen dose TBD at discharge  Supply/Referral recommendations: Pen needles - standard   Use Adult Diabetes Insulin  Treatment Post Discharge order set.  Thank you, Wyvonna Pinal, MSN, CDCES Diabetes Coordinator Inpatient Diabetes Program [phone] (team pager from 8a-5p)

## 2024-01-02 NOTE — Consult Note (Signed)
 I have been asked to see the patient by Dr. Sammi Fredericks, for evaluation and management of gross hematuria.  History of present illness: 63 year old African-American male with a history of grade group 5 prostate cancer status post radiation and hormonal therapy in 2022.  He was subsequently lost to follow-up.  He then represented with an urinary retention with bilateral hydronephrosis.  Ultimately, his catheter was removed in our clinic, but he redeveloped urinary retention.  He is now being managed with a chronic Foley catheter.  During this admission the patient had a amputation of his right foot.  He is been in and out of the ICU.  He also has severe heart failure.  He is currently anticoagulated.  He recently started to develop gross hematuria and now is passing clots.  As a result, I have been consulted by the managing team for further evaluation and management of his gross hematuria.  Patient did have a CAT scan 4 days prior that demonstrated some thickening and enhancement of the urothelium as well as area within the left kidney that is consistent with infection versus infarct.  The patient is homeless.  Review of systems: A 12 point comprehensive review of systems was obtained and is negative unless otherwise stated in the history of present illness.  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   Monoclonal gammopathy 11/12/2023   Hypoalbuminemia 11/08/2023   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 (HCC) 11/04/2023   Chronic anemia 11/04/2023   Generalized weakness 11/03/2023   Sinus tachycardia 11/03/2023   HFrEF (heart failure with reduced ejection fraction) (HCC) 11/02/2023   Chronic osteomyelitis involving lower leg, right (HCC) 11/02/2023   Frailty 11/02/2023   Urinary retention 11/02/2023   Pyelonephritis and Cystitis 10/14/2023   Hyponatremia 10/12/2023    Hyperglycemia 10/12/2023   Tachycardia 10/11/2023   Screening for lung cancer 09/11/2023   Housing instability 09/11/2023   Gangrene of right foot (HCC) 09/02/2023   LV (left ventricular) mural thrombus without MI (HCC) 06/19/2023   NSVT (nonsustained ventricular tachycardia) (HCC) 06/19/2023   Mixed hyperlipidemia 06/19/2023   Osteomyelitis of right foot (HCC) 06/15/2023   AKI (acute kidney injury) (HCC) 06/14/2023   Anemia 06/14/2023   Hyperkalemia 11/22/2022   Iron  deficiency anemia 08/27/2022   MDD (major depressive disorder) 08/27/2022   Malignant neoplasm of prostate (HCC)    CAD (coronary artery disease) 06/10/2020   Hypertension 06/10/2020   DM2 (diabetes mellitus, type 2) (HCC) 06/10/2020   Chronic HFrEF (heart failure with reduced ejection fraction) (HCC)     No current facility-administered medications on file prior to encounter.   Current Outpatient Medications on File Prior to Encounter  Medication Sig Dispense Refill   apixaban  (ELIQUIS ) 5 MG TABS tablet Take 1 tablet (5 mg total) by mouth 2 (two) times daily. (Patient taking differently: Take 5 mg by mouth daily.) 60 tablet 0   aspirin  EC 81 MG tablet Take 1 tablet (81 mg total) by mouth daily. Swallow whole. 30 tablet 0   atorvastatin  (LIPITOR ) 80 MG tablet Take 1 tablet (80 mg total) by mouth daily. 30 tablet 0   dapagliflozin  propanediol (FARXIGA ) 10 MG TABS tablet Take 1 tablet (10 mg total) by mouth daily before breakfast. 30 tablet 0   ezetimibe  (ZETIA ) 10 MG tablet Take 1 tablet (10 mg total) by mouth daily. 30 tablet 0   ferrous sulfate   325 (65 FE) MG tablet Take 1 tablet (325 mg total) by mouth daily. 30 tablet 0   furosemide  (LASIX ) 20 MG tablet Take 1 tablet (20 mg total) by mouth daily as needed for fluid or edema. Please take on tablet if your have shortness of breath, leg swelling, gain more than 3 pounds within one day or gain more than 5 pounds within one week. 30 tablet 11   metFORMIN  (GLUCOPHAGE )  500 MG tablet Take 2 tablets (1,000 mg total) by mouth 2 (two) times daily with a meal. (Patient taking differently: Take 1,000 mg by mouth daily.) 120 tablet 11   metoprolol  succinate (TOPROL -XL) 25 MG 24 hr tablet TAKE 1 TABLET BY MOUTH DAILY 30 tablet 0   Multiple Vitamin (MULTIVITAMIN) tablet Take 1 tablet by mouth daily.     spironolactone  (ALDACTONE ) 25 MG tablet Take 1 tablet (25 mg total) by mouth daily. 30 tablet 0   tamsulosin  (FLOMAX ) 0.4 MG CAPS capsule Take 1 capsule (0.4 mg total) by mouth daily after supper. 30 capsule 0    Past Medical History:  Diagnosis Date   Anemia    Diabetes mellitus without complication (HCC) 1987   Family history of breast cancer    Hypertension    Myocardial infarction [facility])    Prostate cancer [facility])     Past Surgical History:  Procedure Laterality Date   ABDOMINAL AORTOGRAM W/LOWER EXTREMITY N/A 06/16/2023   Procedure: ABDOMINAL AORTOGRAM W/LOWER EXTREMITY;  Surgeon: Magda Debby SAILOR, MD;  Location: [facility] INVASIVE CV LAB;  Service: Cardiovascular;  Laterality: N/A;   AMPUTATION Right 06/21/2023   Procedure: RIGHT 2ND RAY AMPUTATION;  Surgeon: Harden Jerona GAILS, MD;  Location: [facility] OR;  Service: Orthopedics;  Laterality: Right;   AMPUTATION Right 11/17/2023   Procedure: AMPUTATION BELOW KNEE;  Surgeon: Harden Jerona GAILS, MD;  Location: [facility] OR;  Service: Orthopedics;  Laterality: Right;   APPLICATION OF WOUND VAC Right 11/17/2023   Procedure: APPLICATION, WOUND VAC;  Surgeon: Harden Jerona GAILS, MD;  Location: [facility] OR;  Service: Orthopedics;  Laterality: Right;   DENTAL SURGERY     IR BONE MARROW BIOPSY & ASPIRATION  11/16/2023   RADIOACTIVE SEED IMPLANT N/A 02/04/2021   Procedure: RADIOACTIVE SEED IMPLANT/BRACHYTHERAPY IMPLANT;  Surgeon: Selma Donnice SAUNDERS, MD;  Location: [facility] ORS;  Service: Urology;  Laterality: N/A;   RIGHT/LEFT HEART CATH AND CORONARY ANGIOGRAPHY N/A 05/25/2020   Procedure: RIGHT/LEFT HEART CATH AND CORONARY ANGIOGRAPHY;  Surgeon: Swazil[place], Peter M, MD;   Location: [facility] INVASIVE CV LAB;  Service: Cardiovascular;  Laterality: N/A;   RIGHT/LEFT HEART CATH AND CORONARY ANGIOGRAPHY N/A 10/14/2022   Procedure: RIGHT/LEFT HEART CATH AND CORONARY ANGIOGRAPHY;  Surgeon: Cherrie Toribio SAUNDERS, MD;  Location: [facility] INVASIVE CV LAB;  Service: Cardiovascular;  Laterality: N/A;   SPACE OAR INSTILLATION N/A 02/04/2021   Procedure: SPACE OAR INSTILLATION;  Surgeon: Selma Donnice SAUNDERS, MD;  Location: [facility] ORS;  Service: Urology;  Laterality: N/A;    Social History   Tobacco Use   Smoking status: Former    Current packs/day: 0.00    Average packs/day: 1 pack/day for 43.0 years (43.0 ttl pk-yrs)    Types: Cigarettes    Start date: 05/22/1974    Quit date: 05/22/2017    Years since quitting: 6.6   Smokeless tobacco: Never   Tobacco comments:    Quit 2019 about 1PPD  Vaping Use   Vaping status: Never Used  Substance Use Topics   Alcohol use: Not Currently    Comment: last  drink 2015   Drug use: Not Currently    Comment: none in 7 years    Family History  Problem Relation Age of Onset   Breast cancer Mother        dx 30s/40s, twice   Cancer Maternal Grandfather        unknown type, d. >50    PE: Vitals:   01/02/24 1555 01/02/24 1600 01/02/24 1700 01/02/24 1800  BP:  112/78  92/64  Pulse: (!) 127 (!) 131  (!) 107  Resp: 12 (!) 25 17 17   Temp: (!) 103.6 F (39.8 C)  (!) 101.4 F (38.6 C)   TempSrc: Axillary  Axillary   SpO2: 99% 99%  96%  Weight:      Height:       Patient appears to be in no acute distress  patient is alert and oriented x3 Atraumatic normocephalic head No cervical or supraclavicular lymphadenopathy appreciated No increased work of breathing, no audible wheezes/rhonchi Regular sinus rhythm/rate Abdomen is soft, nontender, nondistended, no CVA or suprapubic tenderness Normal male genitals Left leg without significant edema or abnormality, right leg with a BKA Grossly neurologically intact No identifiable skin lesions  Recent  Labs    01/01/24 0457 01/01/24 1328 01/02/24 0632 01/02/24 1509  WBC 9.0  --  8.3 14.6*  HGB 9.8* 9.6* 10.1* 10.2*  HCT 28.9* 28.6* 30.1* 31.0*   Recent Labs    01/01/24 0457 01/02/24 0632 01/02/24 1509  NA 126* 129* 127*  K 4.1 4.3 4.2  CL 91* 92* 93*  CO2 22 21* 19*  GLUCOSE 129* 110* 107*  BUN 112* 100* 109*  CREATININE 5.02* 4.88* 5.05*  CALCIUM  8.3* 8.3* 8.0*   No results for input(s): LABPT, INR in the last 72 hours. No results for input(s): LABURIN in the last 72 hours. Results for orders placed or performed during the hospital encounter of 11/02/23  Culture, OB Urine     Status: None   Collection Time: 11/03/23  8:11 AM   Specimen: Urine, Random  Result Value Ref Range Status   Specimen Description URINE, RANDOM  Final   Special Requests NONE  Final   Culture   Final    NO GROWTH NO GROUP B STREP (S.AGALACTIAE) ISOLATED Performed at [facility] Lab, [address] [place], [name] [address]    Report Status 11/04/2023 FINAL  Final  Culture, blood (Routine X 2) w Reflex to ID Panel     Status: None   Collection Time: 11/03/23 10:23 PM   Specimen: BLOOD RIGHT HAND  Result Value Ref Range Status   Specimen Description BLOOD RIGHT HAND  Final   Special Requests   Final    BOTTLES DRAWN AEROBIC AND ANAEROBIC Blood Culture adequate volume   Culture   Final    NO GROWTH 5 DAYS Performed at [facility], [address], [place], [name] [address]    Report Status 11/08/2023 FINAL  Final  Culture, blood (Routine X 2) w Reflex to ID Panel     Status: None   Collection Time: 11/03/23 10:25 PM   Specimen: BLOOD RIGHT HAND  Result Value Ref Range Status   Specimen Description BLOOD RIGHT HAND  Final   Special Requests   Final    BOTTLES DRAWN AEROBIC AND ANAEROBIC Blood Culture adequate volume   Culture   Final    NO GROWTH 5 DAYS Performed at [facility] Lab, [address], [place], [name] [address]  Report Status 11/08/2023 FINAL  Final   Resp panel by RT-PCR (RSV, Flu A&B, Covid) Anterior Nasal Swab     Status: None   Collection Time: 11/06/23  7:13 AM   Specimen: Anterior Nasal Swab  Result Value Ref Range Status   SARS Coronavirus 2 by RT PCR NEGATIVE NEGATIVE Final   Influenza A by PCR NEGATIVE NEGATIVE Final   Influenza B by PCR NEGATIVE NEGATIVE Final    Comment: (NOTE) The Xpert Xpress SARS-CoV-2/FLU/RSV plus assay is intended as an aid in the diagnosis of influenza from Nasopharyngeal swab specimens and should not be used as a sole basis for treatment. Nasal washings and aspirates are unacceptable for Xpert Xpress SARS-CoV-2/FLU/RSV testing.  Fact Sheet for Patients: BloggerCourse.com  Fact Sheet for Healthcare Providers: SeriousBroker.it  This test is not yet approved or cleared by the [place]  FDA and has been authorized for detection and/or diagnosis of SARS-CoV-2 by FDA under an Emergency Use Authorization (EUA). This EUA will remain in effect (meaning this test can be used) for the duration of the COVID-19 declaration under Section 564(b)(1) of the Act, 21 U.S.C. section 360bbb-3(b)(1), unless the authorization is terminated or revoked.     Resp Syncytial Virus by PCR NEGATIVE NEGATIVE Final    Comment: (NOTE) Fact Sheet for Patients: BloggerCourse.com  Fact Sheet for Healthcare Providers: SeriousBroker.it  This test is not yet approved or cleared by the [place]  FDA and has been authorized for detection and/or diagnosis of SARS-CoV-2 by FDA under an Emergency Use Authorization (EUA). This EUA will remain in effect (meaning this test can be used) for the duration of the COVID-19 declaration under Section 564(b)(1) of the Act, 21 U.S.C. section 360bbb-3(b)(1), unless the authorization is terminated or revoked.  Performed at [facility] Lab, [address], [place],  [name] [address]   Respiratory (~20 pathogens) panel by PCR     Status: None   Collection Time: 11/06/23  7:13 AM   Specimen: Nasopharyngeal Swab; Respiratory  Result Value Ref Range Status   Adenovirus NOT DETECTED NOT DETECTED Final   Coronavirus 229E NOT DETECTED NOT DETECTED Final    Comment: (NOTE) The Coronavirus on the Respiratory Panel, DOES NOT test for the novel  Coronavirus (2019 nCoV)    Coronavirus HKU1 NOT DETECTED NOT DETECTED Final   Coronavirus NL63 NOT DETECTED NOT DETECTED Final   Coronavirus OC43 NOT DETECTED NOT DETECTED Final   Metapneumovirus NOT DETECTED NOT DETECTED Final   Rhinovirus / Enterovirus NOT DETECTED NOT DETECTED Final   Influenza A NOT DETECTED NOT DETECTED Final   Influenza B NOT DETECTED NOT DETECTED Final   Parainfluenza Virus 1 NOT DETECTED NOT DETECTED Final   Parainfluenza Virus 2 NOT DETECTED NOT DETECTED Final   Parainfluenza Virus 3 NOT DETECTED NOT DETECTED Final   Parainfluenza Virus 4 NOT DETECTED NOT DETECTED Final   Respiratory Syncytial Virus NOT DETECTED NOT DETECTED Final   Bordetella pertussis NOT DETECTED NOT DETECTED Final   Bordetella Parapertussis NOT DETECTED NOT DETECTED Final   Chlamydophila pneumoniae NOT DETECTED NOT DETECTED Final   Mycoplasma pneumoniae NOT DETECTED NOT DETECTED Final    Comment: Performed at [facility] Lab, [address], [place], [name] [address]  MRSA Next Gen by PCR, Nasal     Status: Abnormal   Collection Time: 11/06/23  8:53 AM   Specimen: Nasal Mucosa; Nasal Swab  Result Value Ref Range Status   MRSA by PCR Next Gen DETECTED (A) NOT DETECTED Final  Comment: RESULT CALLED TO, READ BACK BY AND VERIFIED WITH: RN DEE FRANCKS ON 11/06/23 @ 1220 BY DRT (NOTE) The GeneXpert MRSA Assay (FDA approved for NASAL specimens only), is one component of a comprehensive MRSA colonization surveillance program. It is not intended to diagnose MRSA infection nor to guide or monitor treatment for MRSA  infections. Test performance is not FDA approved in patients less than 67 years old. Performed at [facility] Lab, [address], [place], [name] [address]   Culture, blood (Routine X 2) w Reflex to ID Panel     Status: None   Collection Time: 11/21/23 12:35 PM   Specimen: BLOOD  Result Value Ref Range Status   Specimen Description BLOOD SITE NOT SPECIFIED  Final   Special Requests   Final    BOTTLES DRAWN AEROBIC AND ANAEROBIC Blood Culture adequate volume   Culture   Final    NO GROWTH 5 DAYS Performed at [facility] Lab, [address] [place], [name] [address]    Report Status 11/26/2023 FINAL  Final  Culture, blood (Routine X 2) w Reflex to ID Panel     Status: None   Collection Time: 11/21/23 12:45 PM   Specimen: BLOOD  Result Value Ref Range Status   Specimen Description BLOOD SITE NOT SPECIFIED  Final   Special Requests   Final    BOTTLES DRAWN AEROBIC AND ANAEROBIC Blood Culture adequate volume   Culture   Final    NO GROWTH 5 DAYS Performed at [facility], [address], [place], [name] [address]    Report Status 11/26/2023 FINAL  Final  Urine Culture (for pregnant, neutropenic or urologic patients or patients with an indwelling urinary catheter)     Status: None   Collection Time: 11/21/23 11:25 PM   Specimen: Urine, Catheterized  Result Value Ref Range Status   Specimen Description URINE, CATHETERIZED  Final   Special Requests NONE  Final   Culture   Final    NO GROWTH Performed at [facility] Lab, [address] [place], [name] [address]    Report Status 11/23/2023 FINAL  Final  Culture, blood (Routine X 2) w Reflex to ID Panel     Status: None   Collection Time: 11/26/23 10:24 AM   Specimen: BLOOD LEFT ARM  Result Value Ref Range Status   Specimen Description BLOOD LEFT ARM  Final   Special Requests   Final    BOTTLES DRAWN AEROBIC ONLY Blood Culture results may not be optimal due to an inadequate volume of blood received in culture  bottles   Culture   Final    NO GROWTH 5 DAYS Performed at [facility], [address] [place], [name] [address]    Report Status 12/01/2023 FINAL  Final  Culture, blood (Routine X 2) w Reflex to ID Panel     Status: None   Collection Time: 11/26/23 10:24 AM   Specimen: BLOOD LEFT HAND  Result Value Ref Range Status   Specimen Description BLOOD LEFT HAND  Final   Special Requests   Final    BOTTLES DRAWN AEROBIC ONLY Blood Culture results may not be optimal due to an inadequate volume of blood received in culture bottles   Culture   Final    NO GROWTH 5 DAYS Performed at [facility], [address] [place], [name] [address]    Report Status 12/01/2023 FINAL  Final  Culture, blood (Routine X 2)  w Reflex to ID Panel     Status: None   Collection Time: 12/06/23 11:36 AM   Specimen: BLOOD RIGHT ARM  Result Value Ref Range Status   Specimen Description BLOOD RIGHT ARM  Final   Special Requests   Final    BOTTLES DRAWN AEROBIC ONLY Blood Culture results may not be optimal due to an inadequate volume of blood received in culture bottles   Culture   Final    NO GROWTH 5 DAYS Performed at [facility], [address], [place], [name] [address]    Report Status 12/11/2023 FINAL  Final  Culture, blood (Routine X 2) w Reflex to ID Panel     Status: None   Collection Time: 12/06/23 11:37 AM   Specimen: BLOOD LEFT ARM  Result Value Ref Range Status   Specimen Description BLOOD LEFT ARM  Final   Special Requests   Final    BOTTLES DRAWN AEROBIC ONLY Blood Culture results may not be optimal due to an inadequate volume of blood received in culture bottles   Culture   Final    NO GROWTH 5 DAYS Performed at [facility], [address] [place], [name] [address]    Report Status 12/11/2023 FINAL  Final  Culture, Urine (Do not remove urinary catheter, catheter placed by urology or difficult to place)     Status: None   Collection Time: 12/27/23 10:05 AM    Specimen: Urine, Catheterized  Result Value Ref Range Status   Specimen Description URINE, CATHETERIZED  Final   Special Requests NONE  Final   Culture   Final    NO GROWTH Performed at [facility], [address] [place], [name] [address]    Report Status 12/28/2023 FINAL  Final  Remove and replace urinary cath (placed > 5 days) then obtain urine culture from new indwelling urinary catheter.     Status: None   Collection Time: 12/29/23  8:17 AM   Specimen: Urine, Catheterized  Result Value Ref Range Status   Specimen Description URINE, CATHETERIZED  Final   Special Requests NONE  Final   Culture   Final    NO GROWTH Performed at [facility], [address], [place], [name] [address]    Report Status 12/30/2023 FINAL  Final  Respiratory (~20 pathogens) panel by PCR     Status: None   Collection Time: 12/29/23  9:21 AM   Specimen: Nasopharyngeal Swab; Respiratory  Result Value Ref Range Status   Adenovirus NOT DETECTED NOT DETECTED Final   Coronavirus 229E NOT DETECTED NOT DETECTED Final    Comment: (NOTE) The Coronavirus on the Respiratory Panel, DOES NOT test for the novel  Coronavirus (2019 nCoV)    Coronavirus HKU1 NOT DETECTED NOT DETECTED Final   Coronavirus NL63 NOT DETECTED NOT DETECTED Final   Coronavirus OC43 NOT DETECTED NOT DETECTED Final   Metapneumovirus NOT DETECTED NOT DETECTED Final   Rhinovirus / Enterovirus NOT DETECTED NOT DETECTED Final   Influenza A NOT DETECTED NOT DETECTED Final   Influenza B NOT DETECTED NOT DETECTED Final   Parainfluenza Virus 1 NOT DETECTED NOT DETECTED Final   Parainfluenza Virus 2 NOT DETECTED NOT DETECTED Final   Parainfluenza Virus 3 NOT DETECTED NOT DETECTED Final   Parainfluenza Virus 4 NOT DETECTED NOT DETECTED Final   Respiratory Syncytial Virus NOT DETECTED NOT DETECTED Final   Bordetella pertussis NOT DETECTED NOT DETECTED Final   Bordetella Parapertussis NOT DETECTED NOT  DETECTED Final   Chlamydophila  pneumoniae NOT DETECTED NOT DETECTED Final   Mycoplasma pneumoniae NOT DETECTED NOT DETECTED Final    Comment: Performed at [facility] Lab, [address] [place], [name] [address]  Resp panel by RT-PCR (RSV, Flu A&B, Covid) Anterior Nasal Swab     Status: None   Collection Time: 12/29/23  9:21 AM   Specimen: Anterior Nasal Swab  Result Value Ref Range Status   SARS Coronavirus 2 by RT PCR NEGATIVE NEGATIVE Final   Influenza A by PCR NEGATIVE NEGATIVE Final   Influenza B by PCR NEGATIVE NEGATIVE Final    Comment: (NOTE) The Xpert Xpress SARS-CoV-2/FLU/RSV plus assay is intended as an aid in the diagnosis of influenza from Nasopharyngeal swab specimens and should not be used as a sole basis for treatment. Nasal washings and aspirates are unacceptable for Xpert Xpress SARS-CoV-2/FLU/RSV testing.  Fact Sheet for Patients: BloggerCourse.com  Fact Sheet for Healthcare Providers: SeriousBroker.it  This test is not yet approved or cleared by the [place]  FDA and has been authorized for detection and/or diagnosis of SARS-CoV-2 by FDA under an Emergency Use Authorization (EUA). This EUA will remain in effect (meaning this test can be used) for the duration of the COVID-19 declaration under Section 564(b)(1) of the Act, 21 U.S.C. section 360bbb-3(b)(1), unless the authorization is terminated or revoked.     Resp Syncytial Virus by PCR NEGATIVE NEGATIVE Final    Comment: (NOTE) Fact Sheet for Patients: BloggerCourse.com  Fact Sheet for Healthcare Providers: SeriousBroker.it  This test is not yet approved or cleared by the [place]  FDA and has been authorized for detection and/or diagnosis of SARS-CoV-2 by FDA under an Emergency Use Authorization (EUA). This EUA will remain in effect (meaning this test can be used) for the duration of the COVID-19 declaration under Section  564(b)(1) of the Act, 21 U.S.C. section 360bbb-3(b)(1), unless the authorization is terminated or revoked.  Performed at [facility] Lab, [address], [place], [name] [address]   Culture, blood (Routine X 2) w Reflex to ID Panel     Status: None (Preliminary result)   Collection Time: 12/29/23  9:26 AM   Specimen: BLOOD RIGHT HAND  Result Value Ref Range Status   Specimen Description BLOOD RIGHT HAND  Final   Special Requests   Final    BOTTLES DRAWN AEROBIC AND ANAEROBIC Blood Culture adequate volume   Culture   Final    NO GROWTH 4 DAYS Performed at [facility], [address], [place], [name] [address]    Report Status PENDING  Incomplete  Culture, blood (Routine X 2) w Reflex to ID Panel     Status: Abnormal   Collection Time: 12/29/23  9:40 AM   Specimen: BLOOD LEFT HAND  Result Value Ref Range Status   Specimen Description BLOOD LEFT HAND  Final   Special Requests   Final    BOTTLES DRAWN AEROBIC AND ANAEROBIC Blood Culture adequate volume   Culture  Setup Time   Final    GRAM POSITIVE COCCI AEROBIC BOTTLE ONLY CRITICAL RESULT CALLED TO, READ BACK BY AND VERIFIED WITH: PHARMD V BRYK 12/30/2023 @ 2330 BY AB    Culture (A)  Final    STAPHYLOCOCCUS EPIDERMIDIS THE SIGNIFICANCE OF ISOLATING THIS ORGANISM FROM A SINGLE SET OF BLOOD CULTURES WHEN MULTIPLE SETS ARE DRAWN IS UNCERTAIN. PLEASE NOTIFY THE MICROBIOLOGY DEPARTMENT WITHIN ONE WEEK IF SPECIATION AND SENSITIVITIES ARE REQUIRED. Performed at [facility],  [address] [place], [name] [address]    Report Status 01/01/2024 FINAL  Final  Blood Culture ID Panel (Reflexed)     Status: Abnormal   Collection Time: 12/29/23  9:40 AM  Result Value Ref Range Status   Enterococcus faecalis NOT DETECTED NOT DETECTED Final   Enterococcus Faecium NOT DETECTED NOT DETECTED Final   Listeria monocytogenes NOT DETECTED NOT DETECTED Final   Staphylococcus species DETECTED (A) NOT DETECTED Final    Comment:  CRITICAL RESULT CALLED TO, READ BACK BY AND VERIFIED WITH: PHARMD V BRYK 12/30/2023 @ 2330 BY AB    Staphylococcus aureus (BCID) NOT DETECTED NOT DETECTED Final   Staphylococcus epidermidis DETECTED (A) NOT DETECTED Final    Comment: Methicillin (oxacillin) resistant coagulase negative staphylococcus. Possible blood culture contaminant (unless isolated from more than one blood culture draw or clinical case suggests pathogenicity). No antibiotic treatment is indicated for blood  culture contaminants. CRITICAL RESULT CALLED TO, READ BACK BY AND VERIFIED WITH: PHARMD V BRYK 12/30/2023 @ 2330 BY AB    Staphylococcus lugdunensis NOT DETECTED NOT DETECTED Final   Streptococcus species NOT DETECTED NOT DETECTED Final   Streptococcus agalactiae NOT DETECTED NOT DETECTED Final   Streptococcus pneumoniae NOT DETECTED NOT DETECTED Final   Streptococcus pyogenes NOT DETECTED NOT DETECTED Final   A.calcoaceticus-baumannii NOT DETECTED NOT DETECTED Final   Bacteroides fragilis NOT DETECTED NOT DETECTED Final   Enterobacterales NOT DETECTED NOT DETECTED Final   Enterobacter cloacae complex NOT DETECTED NOT DETECTED Final   Escherichia coli NOT DETECTED NOT DETECTED Final   Klebsiella aerogenes NOT DETECTED NOT DETECTED Final   Klebsiella oxytoca NOT DETECTED NOT DETECTED Final   Klebsiella pneumoniae NOT DETECTED NOT DETECTED Final   Proteus species NOT DETECTED NOT DETECTED Final   Salmonella species NOT DETECTED NOT DETECTED Final   Serratia marcescens NOT DETECTED NOT DETECTED Final   Haemophilus influenzae NOT DETECTED NOT DETECTED Final   Neisseria meningitidis NOT DETECTED NOT DETECTED Final   Pseudomonas aeruginosa NOT DETECTED NOT DETECTED Final   Stenotrophomonas maltophilia NOT DETECTED NOT DETECTED Final   Candida albicans NOT DETECTED NOT DETECTED Final   Candida auris NOT DETECTED NOT DETECTED Final   Candida glabrata NOT DETECTED NOT DETECTED Final   Candida krusei NOT DETECTED NOT  DETECTED Final   Candida parapsilosis NOT DETECTED NOT DETECTED Final   Candida tropicalis NOT DETECTED NOT DETECTED Final   Cryptococcus neoformans/gattii NOT DETECTED NOT DETECTED Final   Methicillin resistance mecA/C DETECTED (A) NOT DETECTED Final    Comment: CRITICAL RESULT CALLED TO, READ BACK BY AND VERIFIED WITH: PHARMD V BRYK 12/30/2023 @ 2330 BY AB Performed at [facility], [address] [place], [name] [address]   MRSA Next Gen by PCR, Nasal     Status: None   Collection Time: 12/29/23 12:45 PM   Specimen: Nasal Mucosa; Nasal Swab  Result Value Ref Range Status   MRSA by PCR Next Gen NOT DETECTED NOT DETECTED Final    Comment: (NOTE) The GeneXpert MRSA Assay (FDA approved for NASAL specimens only), is one component of a comprehensive MRSA colonization surveillance program. It is not intended to diagnose MRSA infection nor to guide or monitor treatment for MRSA infections. Test performance is not FDA approved in patients less than 21 years old. Performed at [facility], [address], [place], [name] [address]   Culture, blood (Routine X 2) w Reflex to ID Panel     Status: None (Preliminary result)  Collection Time: 12/31/23  2:17 AM   Specimen: BLOOD RIGHT ARM  Result Value Ref Range Status   Specimen Description BLOOD RIGHT ARM  Final   Special Requests   Final    BOTTLES DRAWN AEROBIC AND ANAEROBIC Blood Culture adequate volume   Culture   Final    NO GROWTH 2 DAYS Performed at [facility], [address], [place], [name] [address]    Report Status PENDING  Incomplete  Culture, blood (Routine X 2) w Reflex to ID Panel     Status: None (Preliminary result)   Collection Time: 12/31/23  2:17 AM   Specimen: BLOOD RIGHT HAND  Result Value Ref Range Status   Specimen Description BLOOD RIGHT HAND  Final   Special Requests   Final    BOTTLES DRAWN AEROBIC AND ANAEROBIC Blood Culture adequate volume   Culture   Final    NO GROWTH 2  DAYS Performed at [facility], [address] [place], [name] [address]    Report Status PENDING  Incomplete    Imaging: none  Imp:  Patient has gross hematuria unclear etiology.  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  This is likely exacerbated by his anticoagulation.  Further, this is all likely on top of him having a urinary tract infection which is consistent with his CT scan findings and the urothelial enhancement with bilateral hydroureteronephrosis.  He is catheter dependent as of now, having failed several voiding trials recently.   Recommendations:  A 24 French three-way Foley catheter was placed and hand irrigated.  He was then placed on continuous bladder irrigation.  Will wean the continuous bladder irrigation off as able.  At some point the patient will need close follow-up with urology so that we can perform cystoscopy for further evaluation.  If the patient continues to have signs and symptoms of infection despite aggressive IV antibiotics would recommend repeating a CT scan to ensure that the area in his kidney has not developed into an abscess which would need to be drained before it will get better.  He will continue to follow. Morene LELON Salines

## 2024-01-02 NOTE — Progress Notes (Signed)
 PHARMACY - ANTICOAGULATION CONSULT NOTE  Pharmacy Consult:  Heparin  Indication: LV thrombus  Allergies  Allergen Reactions   Cefepime  Other (See Comments)    Cefepime  neurotoxicity 11/07/23    Patient Measurements: Height: 6' (182.9 cm) Weight: 59 kg (130 lb 1.1 oz) IBW/kg (Calculated) : 77.6 HEPARIN  DW (KG): 42.1  Vital Signs: Temp: 98.2 F (36.8 C) (09/02 0838) Temp Source: Oral (09/02 0838) BP: 126/93 (09/02 0838) Pulse Rate: 122 (09/02 0838)  Labs: Recent Labs    12/30/23 2241 12/31/23 0400 12/31/23 1200 12/31/23 1400 01/01/24 0142 01/01/24 0457 01/01/24 1000 01/01/24 1328 01/01/24 2153 01/02/24 0632 01/02/24 0644 01/02/24 0645  HGB  --  6.8*  --  6.2* 9.4* 9.8*  --  9.6*  --  10.1*  --   --   HCT  --  20.7*  --  19.2* 28.5* 28.9*  --  28.6*  --  30.1*  --   --   PLT  --  226  --  177  --  236  --   --   --  231  --   --   APTT 50*  --    < >  --  53*  --  55*  --  51*  --  57*  --   HEPARINUNFRC >1.10*  --   --   --  >1.10*  --   --   --   --   --   --  0.74*  CREATININE  --  5.03*  --   --   --  5.02*  --   --   --  4.88*  --   --    < > = values in this interval not displayed.    Estimated Creatinine Clearance: 12.8 mL/min (A) (by C-G formula based on SCr of 4.88 mg/dL (H)).   Assessment: 60 YOM with  LV thrombus off Eliquis  due to hematuria, last dose 8/29 AM.  CBC stable and Pharmacy consulted to dose IV heparin .  CT also shows new polypoid thrombus and multiple splenic infarcts.  Confirmed weight of ~42 kg.   aPTT sub-therapeutic at 57 sec.  Heparin  level remains elevated due to Eliquis ' effect.  Hgb 10.1 s/p transfusions (last 9/1).  Hematuria improving per RN.  Goal of Therapy:  Heparin  level 0.3-0.5 units/ml aPTT 66-85 seconds d/t bleeding risk Monitor platelets by anticoagulation protocol: Yes   Plan:  Increase heparin  gtt to 1200 units/hr Check 8 hr aPTT Daily heparin  level, aPTT and CBC Monitor for hematuria resolution  Maurilio Fila,  PharmD Clinical Pharmacist 01/02/2024  10:02 AM

## 2024-01-02 NOTE — Progress Notes (Signed)
 Occupational Therapy Treatment Patient Details Name: Derek Thompson MRN: [id number] DOB: 03/25/1961 Today's Date: 01/02/2024   History of present illness Pt is a 63 y/o M presenting to ED on 11/02/23 with lethargy. Pt found to be tachycardic, hyperkalemic, and hyponatremic. 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 Pt transferred to icu on 8/29. 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   OT comments  [facility] OT/PT session: Patient on progressive unit from ICU and stated he was fatigued but willing to participate with therapy. Once on EOB patient asked to not get up in chair and to return to supine at end of session due to increased fatigue from lack of sleep. Patient participated in peri area bathing and gown change from EOB with patient requiring mod assist +2 to stand and max assist for peri area bathing while standing. Patient returned to supine at end of session and will continue to be followed by acute OT with [facility] program.       If plan is discharge home, recommend the following:  A little help with walking and/or transfers;A little help with bathing/dressing/bathroom;Assistance with cooking/housework;Direct supervision/assist for medications management;Direct supervision/assist for financial management;Assist for transportation;Help with stairs or ramp for entrance   Equipment Recommendations  Other (comment) (defer)    Recommendations for Other Services      Precautions / Restrictions Precautions Precautions: Fall;Other (comment) Recall of Precautions/Restrictions: Impaired Precaution/Restrictions Comments: R BKA- no pillows under knee Required Braces or Orthoses: Other Brace Other Brace: prosthetist request use of limb protector in bed to keep knee extension  at night Restrictions Weight Bearing Restrictions Per Provider Order: No RLE Weight Bearing Per Provider Order: Non weight bearing Other Position/Activity Restrictions: shrinker       Mobility Bed Mobility Overal bed mobility: Needs Assistance Bed Mobility: Supine to Sit, Sit to Supine     Supine to sit: Used rails, HOB elevated, Mod assist Sit to supine: Min assist   General bed mobility comments: max directional verbal cues, delayed response time, modA to scoot to EOB and trunk elevation    Transfers Overall transfer level: Needs assistance Equipment used: Rolling walker (2 wheels) Transfers: Sit to/from Stand, Bed to chair/wheelchair/BSC Sit to Stand: From elevated surface, Mod assist, +2 physical assistance           General transfer comment: patient performed standing from EOB to allow for peri area bathing with mod assist +2     Balance Overall balance assessment: Needs assistance Sitting-balance support: No upper extremity supported, Feet supported Sitting balance-Leahy Scale: Good Sitting balance - Comments: able to perform self care tasks seated on EOB without LOB   Standing balance support: Bilateral upper extremity supported, During functional activity Standing balance-Leahy Scale: Poor Standing balance comment: pt able to stand with outside assist ~3 min for pericare                           ADL either performed or assessed with clinical judgement   ADL Overall ADL's : Needs assistance/impaired             Lower Body Bathing: Maximal assistance;Sit to/from stand Lower Body Bathing Details (indicate cue type and reason): stood at EOB with max assist for peri area bathing Upper Body Dressing : Minimal assistance;Sitting  Upper Body Dressing Details (indicate cue type and reason): due to lines Lower Body Dressing: Maximal assistance;Bed level Lower Body Dressing Details (indicate cue type and reason): to donn sock                General ADL Comments: patient complaining of fatigue and requiring increased assistance with self care tasks    Extremity/Trunk Assessment              Vision       Perception     Praxis     Communication Communication Communication: Impaired Factors Affecting Communication: Difficulty expressing self   Cognition Arousal: Alert, Lethargic Behavior During Therapy: Flat affect Cognition: Cognition impaired           Executive functioning impairment (select all impairments): Problem solving                   Following commands: Impaired Following commands impaired: Follows one step commands with increased time      Cueing   Cueing Techniques: Verbal cues, Gestural cues, Tactile cues  Exercises      Shoulder Instructions       General Comments HR in 120 at rest, low 130s with standing    Pertinent Vitals/ Pain       Pain Assessment Pain Assessment: Faces Faces Pain Scale: Hurts little more Pain Location: bladder pain, greater in standing Pain Descriptors / Indicators: Grimacing, Tiring Pain Intervention(s): Limited activity within patient's tolerance, Monitored during session, Repositioned  Home Living                                          Prior Functioning/Environment              Frequency  Min 2X/week (on [facility] Program)        Progress Toward Goals  OT Goals(current goals can now be found in the care plan section)  Progress towards OT goals: Progressing toward goals (slow)  Acute Rehab OT Goals Patient Stated Goal: feel better OT Goal Formulation: With patient Time For Goal Achievement: 01/03/24 Potential to Achieve Goals: Fair ADL Goals Pt Will Perform Grooming: with supervision;standing Pt Will Perform Lower Body Bathing: with set-up;sitting/lateral leans Pt Will Perform Upper Body Dressing: with set-up;sitting Pt Will Perform Lower Body Dressing: with set-up;sitting/lateral leans Pt Will Transfer to  Toilet: with supervision;ambulating;bedside commode Pt Will Perform Toileting - Clothing Manipulation and hygiene: with set-up;sitting/lateral leans Pt/caregiver will Perform Home Exercise Program: Increased strength;Both right and left upper extremity;With theraband;With theraputty;Independently;With written HEP provided Additional ADL Goal #1: Pt will be able to follow 1 step commands with >50% accuracy to maximize participation in ADLs  Plan      Co-evaluation    PT/OT/SLP Co-Evaluation/Treatment: Yes Reason for Co-Treatment: To address functional/ADL transfers (decreased activity tolerance) PT goals addressed during session: Mobility/safety with mobility OT goals addressed during session: ADL's and self-care      AM-PAC OT 6 Clicks Daily Activity     Outcome Measure   Help from another person eating meals?: None Help from another person taking care of personal grooming?: A Little Help from another person toileting, which includes using toliet, bedpan, or urinal?: A Lot Help from another person bathing (including washing, rinsing, drying)?: A Little Help from another person to put on and taking off regular upper body clothing?: A Little Help from another person to put on and  taking off regular lower body clothing?: A Lot 6 Click Score: 17    End of Session Equipment Utilized During Treatment: Gait belt;Rolling walker (2 wheels)  OT Visit Diagnosis: Unsteadiness on feet (R26.81);Other abnormalities of gait and mobility (R26.89);Muscle weakness (generalized) (M62.81);Other symptoms and signs involving cognitive function Pain - part of body:  (bladder)   Activity Tolerance Patient tolerated treatment well   Patient Left in bed;with call bell/phone within reach;with bed alarm set   Nurse Communication Mobility status        Time: 8890-8867 OT Time Calculation (min): 23 min  Charges: OT General Charges $OT Visit: 1 Visit OT Treatments $Self Care/Home Management : 8-22  mins  Dick Laine, OTA Acute Rehabilitation Services  Office [phone]   Jeb LITTIE Laine 01/02/2024, 1:47 PM

## 2024-01-02 NOTE — TOC Progression Note (Addendum)
 Transition of Care [facility]) - Progression Note    Patient Details  Name: Derek Thompson MRN: [id number] Date of Birth: 09-03-1960  [facility] CM/SW Contact  [name], [place] Phone Number: 01/02/2024, 8:49 AM  Clinical Narrative:   Barriers remain - No bed offers for SNF. Pt to continue working with therapies in  [facility] program and [facility]   TOC will continue to follow.   Expected Discharge Plan: Skilled Nursing Facility Barriers to Discharge: Insurance Authorization               Expected Discharge Plan and Services In-house Referral: Clinical Social Work   Post Acute Care Choice: Skilled Nursing Facility Living arrangements for the past 2 months: Homeless Shelter                                       Social Drivers of Health (SDOH) Interventions SDOH Screenings   Food Insecurity: No Food Insecurity (11/02/2023)  Housing: High Risk (11/02/2023)  Transportation Needs: Unmet Transportation Needs (11/02/2023)  Utilities: Not At Risk (11/02/2023)  Alcohol Screen: Low Risk  (08/19/2022)  Depression (PHQ2-9): Low Risk  (10/11/2023)  Financial Resource Strain: Low Risk  (08/19/2022)  Tobacco Use: Medium Risk (11/17/2023)    Readmission Risk Interventions     No data to display

## 2024-01-03 ENCOUNTER — Inpatient Hospital Stay (HOSPITAL_COMMUNITY): Payer: MEDICAID

## 2024-01-03 DIAGNOSIS — I5023 Acute on chronic systolic (congestive) heart failure: Secondary | ICD-10-CM

## 2024-01-03 DIAGNOSIS — I5021 Acute systolic (congestive) heart failure: Secondary | ICD-10-CM | POA: Diagnosis not present

## 2024-01-03 DIAGNOSIS — R579 Shock, unspecified: Secondary | ICD-10-CM

## 2024-01-03 DIAGNOSIS — A419 Sepsis, unspecified organism: Secondary | ICD-10-CM | POA: Diagnosis not present

## 2024-01-03 DIAGNOSIS — N179 Acute kidney failure, unspecified: Secondary | ICD-10-CM | POA: Diagnosis not present

## 2024-01-03 DIAGNOSIS — Z515 Encounter for palliative care: Secondary | ICD-10-CM | POA: Diagnosis not present

## 2024-01-03 DIAGNOSIS — N1832 Chronic kidney disease, stage 3b: Secondary | ICD-10-CM | POA: Diagnosis not present

## 2024-01-03 DIAGNOSIS — Z7189 Other specified counseling: Secondary | ICD-10-CM | POA: Diagnosis not present

## 2024-01-03 DIAGNOSIS — M869 Osteomyelitis, unspecified: Secondary | ICD-10-CM | POA: Diagnosis not present

## 2024-01-03 LAB — GLUCOSE, CAPILLARY
Glucose-Capillary: 79 mg/dL (ref 70–99)
Glucose-Capillary: 90 mg/dL (ref 70–99)
Glucose-Capillary: 102 mg/dL — ABNORMAL HIGH (ref 70–99)
Glucose-Capillary: 162 mg/dL — ABNORMAL HIGH (ref 70–99)
Glucose-Capillary: 192 mg/dL — ABNORMAL HIGH (ref 70–99)

## 2024-01-03 LAB — CBC
HCT: 29.8 % — ABNORMAL LOW (ref 39.0–52.0)
HCT: 28.4 % — ABNORMAL LOW (ref 39.0–52.0)
Hemoglobin: 9.8 g/dL — ABNORMAL LOW (ref 13.0–17.0)
Hemoglobin: 9.4 g/dL — ABNORMAL LOW (ref 13.0–17.0)
MCH: 28.2 pg (ref 26.0–34.0)
MCH: 28.5 pg (ref 26.0–34.0)
MCHC: 32.9 g/dL (ref 30.0–36.0)
MCHC: 33.1 g/dL (ref 30.0–36.0)
MCV: 85.9 fL (ref 80.0–100.0)
MCV: 86.1 fL (ref 80.0–100.0)
Platelets: 209 K/uL (ref 150–400)
Platelets: 201 K/uL (ref 150–400)
RBC: 3.47 MIL/uL — ABNORMAL LOW (ref 4.22–5.81)
RBC: 3.3 MIL/uL — ABNORMAL LOW (ref 4.22–5.81)
RDW: 17.1 % — ABNORMAL HIGH (ref 11.5–15.5)
RDW: 17 % — ABNORMAL HIGH (ref 11.5–15.5)
WBC: 18.4 K/uL — ABNORMAL HIGH (ref 4.0–10.5)
WBC: 13.6 K/uL — ABNORMAL HIGH (ref 4.0–10.5)
nRBC: 0 % (ref 0.0–0.2)
nRBC: 0 % (ref 0.0–0.2)

## 2024-01-03 LAB — COOXEMETRY PANEL
Carboxyhemoglobin: 1.4 % (ref 0.5–1.5)
Methemoglobin: <0.7 % (ref 0.0–1.5)
O2 Saturation: 69.1 %
Total hemoglobin: 10.3 g/dL — ABNORMAL LOW (ref 12.0–16.0)

## 2024-01-03 LAB — BASIC METABOLIC PANEL WITH GFR
Anion gap: 15 (ref 5–15)
BUN: 106 mg/dL — ABNORMAL HIGH (ref 8–23)
CO2: 17 mmol/L — ABNORMAL LOW (ref 22–32)
Calcium: 8.1 mg/dL — ABNORMAL LOW (ref 8.9–10.3)
Chloride: 93 mmol/L — ABNORMAL LOW (ref 98–111)
Creatinine, Ser: 5.6 mg/dL — ABNORMAL HIGH (ref 0.61–1.24)
GFR, Estimated: 11 mL/min — ABNORMAL LOW (ref >60)
Glucose, Bld: 81 mg/dL (ref 70–99)
Potassium: 4.5 mmol/L (ref 3.5–5.1)
Sodium: 125 mmol/L — ABNORMAL LOW (ref 135–145)

## 2024-01-03 LAB — APTT: aPTT: 72 s — ABNORMAL HIGH (ref 24–36)

## 2024-01-03 LAB — CULTURE, BLOOD (ROUTINE X 2)
Culture: NO GROWTH
Special Requests: ADEQUATE

## 2024-01-03 LAB — HEPARIN LEVEL (UNFRACTIONATED)
Heparin Unfractionated: 0.47 [IU]/mL (ref 0.30–0.70)
Heparin Unfractionated: 0.37 [IU]/mL (ref 0.30–0.70)

## 2024-01-03 MED ORDER — INSULIN ASPART 100 UNIT/ML IJ SOLN
0.0000 [IU] | INTRAMUSCULAR | Status: DC
  Administered 2024-01-03 (×2): 1 [IU] via SUBCUTANEOUS
  Administered 2024-01-04: 3 [IU] via SUBCUTANEOUS
  Administered 2024-01-04: 2 [IU] via SUBCUTANEOUS
  Administered 2024-01-04: 1 [IU] via SUBCUTANEOUS
  Administered 2024-01-04 (×2): 5 [IU] via SUBCUTANEOUS
  Administered 2024-01-04: 1 [IU] via SUBCUTANEOUS

## 2024-01-03 MED ORDER — DEXAMETHASONE SODIUM PHOSPHATE 10 MG/ML IJ SOLN
20.0000 mg | INTRAMUSCULAR | Status: DC
  Administered 2024-01-03: 20 mg via INTRAVENOUS
  Filled 2024-01-03: qty 2

## 2024-01-03 MED ORDER — SODIUM BICARBONATE 8.4 % IV SOLN
100.0000 meq | Freq: Once | INTRAVENOUS | Status: AC
  Administered 2024-01-03: 100 meq via INTRAVENOUS
  Filled 2024-01-03: qty 100

## 2024-01-03 MED ORDER — SODIUM CHLORIDE 0.9 % IR SOLN
3000.0000 mL | Status: DC
  Administered 2024-01-03 – 2024-01-08 (×19): 3000 mL

## 2024-01-03 MED ORDER — GERHARDT'S BUTT CREAM
TOPICAL_CREAM | Freq: Two times a day (BID) | CUTANEOUS | Status: DC
  Administered 2024-01-04 – 2024-01-08 (×2): 1 via TOPICAL
  Filled 2024-01-03: qty 60

## 2024-01-03 MED ORDER — SODIUM CHLORIDE 0.9 % IV SOLN
1.0000 g | Freq: Two times a day (BID) | INTRAVENOUS | Status: DC
  Administered 2024-01-03 – 2024-01-04 (×3): 1 g via INTRAVENOUS
  Filled 2024-01-03 (×3): qty 20

## 2024-01-03 MED ORDER — LOPERAMIDE HCL 2 MG PO CAPS
4.0000 mg | ORAL_CAPSULE | Freq: Once | ORAL | Status: AC
  Administered 2024-01-03: 4 mg via ORAL
  Filled 2024-01-03: qty 2

## 2024-01-03 NOTE — Plan of Care (Signed)
  Problem: Activity: Goal: Risk for activity intolerance will decrease Outcome: Progressing   Problem: Education: Goal: Individualized Educational Video(s) Outcome: Progressing   Problem: Coping: Goal: Ability to adjust to condition or change in health will improve Outcome: Progressing   Problem: Fluid Volume: Goal: Ability to maintain a balanced intake and output will improve Outcome: Progressing   Problem: Health Behavior/Discharge Planning: Goal: Ability to identify and utilize available resources and services will improve Outcome: Progressing Goal: Ability to manage health-related needs will improve Outcome: Progressing   Problem: Metabolic: Goal: Ability to maintain appropriate glucose levels will improve Outcome: Progressing   Problem: Nutritional: Goal: Maintenance of adequate nutrition will improve Outcome: Progressing Goal: Progress toward achieving an optimal weight will improve Outcome: Progressing   Problem: Tissue Perfusion: Goal: Adequacy of tissue perfusion will improve Outcome: Progressing

## 2024-01-03 NOTE — Consult Note (Signed)
 Nephrology Consult   Requesting provider: Dr. Harold Service requesting consult: PCCM Reason for consult: AKI  Assessment/Recommendations:   AKI on CKD -suspect ATN at this point. Causes are likely related to recent shock/decrease renal perfusion, MGRS, obstruction. Received contrast on 8/29. Will repeat renal ultrasound to see if he still has hydronephrosis -no current indications for renal replacement therapy at this junction but seems that he is heading down this route. Discussed in detail with patient today. I shared  my concerns in regards to doing dialysis in him particularly his HFrEF, already low BP, low functional status (BKA). I did inform him that it is unclear if dialysis would be permament vs temporary but with his other current comorbidities it does seem that it would be long term. He understands the process and concerns. He would like to give it a day to think about it in regards to pursuing dialysis (if it were to come to that). -at this junction, I recommend continuing supportive care -Avoid nephrotoxic medications including NSAIDs and iodinated intravenous contrast exposure unless the latter is absolutely indicated.  Preferred narcotic agents for pain control are hydromorphone , fentanyl , and methadone. Morphine  should not be used. Avoid Baclofen and avoid oral sodium phosphate  and magnesium  citrate based laxatives / bowel preps. Continue strict Input and Output monitoring. Will monitor the patient closely with you and intervene or adjust therapy as indicated by changes in clinical status/labs   Septic shock -likely urinary source -abx per primary service -off pressors now. Keep MAP >65 to ensure adequate renal perfusion  Urinary retention Hematuria -urology following, undergoing CBI   LV thrombus -on heparin  drip  Multiple Myeloma -diagnosed via bone marrow biopsy. Plan to follow up with oncology. Given his acute/current issues along with functional status, I do not  foresee him undergoing chemo at this time. 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 Volume status:   Acidosis -secondary to AKI, trend for now  Chronic osteomyelitis s/p right BKA -per primary service  Anemia -transfuse prn for Hgb <7  Recommendations conveyed to primary service.  Guarded prognosis.  Ephriam Stank [facility] 01/03/2024 10:49 AM   _____________________________________________________________________________________   History of Present Illness: Derek Thompson is a/an 63 y.o. homeless male with a past medical history of HFrEF, LV thrombus, DM2 with neuropathy, history of CVA, CKD 3A, anemia, prostate cancer status post radiation and ADT 2022, BPH who presents initially on 7/3 with progressive fatigue.  Has had a prolonged hospitalization. Admitted for sepsis secondary to right foot osteomyelitis send presumed UTI.  Ultimately, underwent right BKA on 7/18.  Course complicated by suspected catheter associated UTI.  Also developed AKI on CKD and workup revealed multiple myeloma on bone marrow biopsy.  In early August, was cleared for discharge however unable to find the facility and did not have a safe disposition.  On 8/29, transferred to ICU for somnolence, shock, fever.  Concern for mixed septic/cardiogenic shock and required pressors.  Was transferred out on 9/1 however readmitted back to the ICU for new fever, tachycardia, hypotension.  His course is further complicated by hematuria now requiring CBI. Patient seen and examined bedside. Seems tired. He otherwise reports feeling okay, does report some nausea but denies any chest pain, SOB, loss of appetite, dysgeusia.   Medications:  Current Facility-Administered Medications  Medication Dose Route Frequency Provider Last Rate Last Admin   acetaminophen   (TYLENOL )  tablet 1,000 mg  1,000 mg Oral Q6H Norrine Sharper, MD   1,000 mg at 01/03/24 0601   atorvastatin  (LIPITOR ) tablet 80 mg  80 mg Oral Daily Byrum, Robert S, MD   80 mg at 01/03/24 1003   Chlorhexidine  Gluconate Cloth 2 % PADS 6 each  6 each Topical Daily Shelah Lamar RAMAN, MD   6 each at 01/02/24 9094   ezetimibe  (ZETIA ) tablet 10 mg  10 mg Oral Daily Shelah Lamar RAMAN, MD   10 mg at 01/03/24 1003   feeding supplement (ENSURE PLUS HIGH PROTEIN) liquid 237 mL  237 mL Oral BID BM Shelah Lamar RAMAN, MD   237 mL at 01/03/24 1004   Gerhardt's butt cream   Topical BID Harold Scholz, MD       heparin  ADULT infusion 100 units/mL (25000 units/250mL)  1,150 Units/hr Intravenous Continuous Paytes, Maurilio PENNER, RPH 11.5 mL/hr at 01/03/24 1000 1,150 Units/hr at 01/03/24 1000   insulin  aspart (novoLOG ) injection 0-9 Units  0-9 Units Subcutaneous Q4H Shelah Lamar RAMAN, MD   1 Units at 01/01/24 1207   loperamide  (IMODIUM ) capsule 4 mg  4 mg Oral Once Gleason, Leita SAUNDERS, PA-C       meropenem  (MERREM ) 1 g in sodium chloride  0.9 % 100 mL IVPB  1 g Intravenous Q12H Harold Scholz, MD 200 mL/hr at 01/03/24 1003 1 g at 01/03/24 1003   mupirocin  ointment (BACTROBAN ) 2 %   Nasal BID Shelah Lamar RAMAN, MD   Given at 01/03/24 1003   Oral care mouth rinse  15 mL Mouth Rinse PRN Shelah Lamar RAMAN, MD       polyethylene glycol (MIRALAX  / GLYCOLAX ) packet 17 g  17 g Oral Daily PRN Shelah Lamar RAMAN, MD   17 g at 12/29/23 0000   sodium bicarbonate  injection 100 mEq  100 mEq Intravenous Once Chand, Sudham, MD         ALLERGIES Cefepime   MEDICAL HISTORY Past Medical History:  Diagnosis Date   Anemia    Diabetes mellitus without complication (HCC) 1987   Family history of breast cancer    Hypertension    Myocardial infarction [facility])    Prostate cancer [facility])      SOCIAL HISTORY Social History   Socioeconomic History   Marital status: Divorced    Spouse name: Not on file   Number of children: 3   Years of education: Not on  file   Highest education level: Associate degree: academic program  Occupational History   Occupation: Training and development officer  Tobacco Use   Smoking status: Former    Current packs/day: 0.00    Average packs/day: 1 pack/day for 43.0 years (43.0 ttl pk-yrs)    Types: Cigarettes    Start date: 05/22/1974    Quit date: 05/22/2017    Years since quitting: 6.6   Smokeless tobacco: Never   Tobacco comments:    Quit 2019 about 1PPD  Vaping Use   Vaping status: Never Used  Substance and Sexual Activity   Alcohol use: Not Currently    Comment: last drink 2015   Drug use: Not Currently    Comment: none in 7 years   Sexual activity: Not Currently  Other Topics Concern   Not on file  Social History Narrative   3 sons   Social Drivers of Health   Financial Resource Strain: Low Risk  (08/19/2022)   Overall Financial Resource Strain (CARDIA)    Difficulty of Paying Living Expenses: Not very hard  Food Insecurity: No  Food Insecurity (11/02/2023)   Hunger Vital Sign    Worried About Running Out of Food in the Last Year: Never true    Ran Out of Food in the Last Year: Never true  Transportation Needs: Unmet Transportation Needs (11/02/2023)   PRAPARE - Administrator, Civil Service (Medical): Yes    Lack of Transportation (Non-Medical): Yes  Physical Activity: Not on file  Stress: Not on file  Social Connections: Not on file  Intimate Partner Violence: Not At Risk (11/02/2023)   Humiliation, Afraid, Rape, and Kick questionnaire    Fear of Current or Ex-Partner: No    Emotionally Abused: No    Physically Abused: No    Sexually Abused: No     FAMILY HISTORY Family History  Problem Relation Age of Onset   Breast cancer Mother        dx 30s/40s, twice   Cancer Maternal Grandfather        unknown type, d. >50     No family history of kidney disease  Review of Systems: 12 systems reviewed Otherwise as per HPI, all other systems reviewed and negative  Physical Exam: Vitals:   01/03/24  0900 01/03/24 1000  BP: (!) 103/54   Pulse: (!) 104 96  Resp: 20 15  Temp:    SpO2: 98% 97%   Total I/O In: 3085.6 [I.V.:35.6; Other:3000; IV Piggyback:50] Out: 2400 [Urine:2400]  Intake/Output Summary (Last 24 hours) at 01/03/2024 1049 Last data filed at 01/03/2024 1038 Gross per 24 hour  Intake 12851.93 ml  Output 87649 ml  Net 501.93 ml   General: tired-appearing, no acute distress HEENT: anicteric sclera, oropharynx clear without lesions CV: regular rate, normal rhythm, no murmurs, no gallops, no rubs, no peripheral edema Lungs: clear to auscultation bilaterally, normal work of breathing/unlabored Abd: soft, non-tender, non-distended Skin: no visible lesions or rashes Psych: alert, engaged, appropriate mood and affect Musculoskeletal: right BKA, no LLE edema Neuro: normal speech, no gross focal deficits   Test Results Reviewed Lab Results  Component Value Date   NA 125 (L) 01/03/2024   K 4.5 01/03/2024   CL 93 (L) 01/03/2024   CO2 17 (L) 01/03/2024   BUN 106 (H) 01/03/2024   CREATININE 5.60 (H) 01/03/2024   CALCIUM  8.1 (L) 01/03/2024   ALBUMIN 1.8 (L) 01/02/2024   PHOS 4.6 01/02/2024     I have reviewed all relevant outside healthcare records related to the patient's kidney injury.

## 2024-01-03 NOTE — Progress Notes (Signed)
 Physical Therapy Treatment Patient Details Name: Donald Memoli MRN: [id number] DOB: 1960/10/20 Today's Date: 01/03/2024   History of Present Illness Pt is a 63 y/o M presenting to ED on 11/02/23 with lethargy. Pt found to be tachycardic, hyperkalemic, and hyponatremic. 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 Pt transferred to icu on 8/29. Transferred back to progressive floor 9/1 and back to ICU 9/2 for treatment of tacycardia, left ventricular thrombus and septic shock PMH: urinary retention, HTN, DM2, CVA in February 2025, anemia, prostate cancer with chronic incontinence following radiation therapy, recent osteomyelitis of the right foot and LV thrombus; s/p R foot 1st and 2nd ray amputation, HFrEF (EF 20-25%).    PT Comments  [facility] PT Treatment Session: Pt supine in bed on entry, leaning his head against the bed rail, looking completely exhausted. Pt agreeable to limited mobility. Pt found to be incontinent of stool, so initiated pericare. Pt able to roll L and R with use of bed rail and contact guard assist. Pt with phone call from loved one, so once pt cleaned and pulled up in bed left him supine in bed. PT will follow back tomorrow for more extensive session.     If plan is discharge home, recommend the following: A lot of help with bathing/dressing/bathroom;Supervision due to cognitive status;Help with stairs or ramp for entrance;Assistance with cooking/housework;Assist for transportation;A little help with walking and/or transfers   Can travel by private vehicle     Yes  Equipment Recommendations  BSC/3in1;Wheelchair (measurements PT);Wheelchair cushion (measurements PT);Other (comment)       Precautions / Restrictions Precautions Precautions: Fall;Other (comment) Recall of Precautions/Restrictions: Impaired Precaution/Restrictions Comments: R BKA- no pillows under knee Required Braces or Orthoses: Other Brace Other Brace: prosthetist request use of limb  protector in bed to keep knee extension at night Restrictions Weight Bearing Restrictions Per Provider Order: No RLE Weight Bearing Per Provider Order: Non weight bearing Other Position/Activity Restrictions: shrinker     Mobility  Bed Mobility Overal bed mobility: Needs Assistance       Supine to sit: Used rails, Contact guard     General bed mobility comments: increased effort able to roll onto L and R side for pericare    Transfers                   General transfer comment: unable this date    Ambulation/Gait               General Gait Details: unable this date due to diarrhea         Communication Communication Communication: Impaired Factors Affecting Communication: Difficulty expressing self  Cognition Arousal: Alert, Lethargic Behavior During Therapy: Flat affect   PT - Cognitive impairments: Problem solving                       PT - Cognition Comments: slowed responses, reports increased fatigue Following commands: Impaired Following commands impaired: Follows one step commands with increased time    Cueing Cueing Techniques: Verbal cues, Gestural cues, Tactile cues     General Comments General comments (skin integrity, edema, etc.): HR stabilized in the 90s      Pertinent Vitals/Pain Pain Assessment Pain Assessment: Faces Faces Pain Scale: Hurts little more Pain Location: generalized Pain Descriptors / Indicators: Grimacing, Tiring Pain Intervention(s): Limited activity within patient's tolerance, Monitored during session, Repositioned    Home Living Family/patient expects to be discharged to:: Skilled nursing facility  PT Goals (current goals can now be found in the care plan section) Acute Rehab PT Goals Patient Stated Goal: to get to rehab PT Goal Formulation: With patient Time For Goal Achievement: 01/30/24 Potential to Achieve Goals: Good Progress towards PT goals: Not  progressing toward goals - comment (transferred to ICU)    Frequency    Min 2X/week       Co-evaluation PT/OT/SLP Co-Evaluation/Treatment: Yes Reason for Co-Treatment: To address functional/ADL transfers (decreased functional ability) PT goals addressed during session: Mobility/safety with mobility        AM-PAC PT 6 Clicks Mobility   Outcome Measure  Help needed turning from your back to your side while in a flat bed without using bedrails?: A Lot Help needed moving from lying on your back to sitting on the side of a flat bed without using bedrails?: A Lot Help needed moving to and from a bed to a chair (including a wheelchair)?: Total Help needed standing up from a chair using your arms (e.g., wheelchair or bedside chair)?: Total Help needed to walk in hospital room?: Total   6 Click Score: 7    End of Session   Activity Tolerance: Patient limited by fatigue Patient left: with call bell/phone within reach;in chair;with chair alarm set Nurse Communication: Mobility status PT Visit Diagnosis: Unsteadiness on feet (R26.81);Difficulty in walking, not elsewhere classified (R26.2);Other abnormalities of gait and mobility (R26.89);Muscle weakness (generalized) (M62.81) Pain - Right/Left: Right Pain - part of body: Leg     Time: 1129-1150 PT Time Calculation (min) (ACUTE ONLY): 21 min  Charges:    $Therapeutic Activity: 8-22 mins PT General Charges $$ ACUTE PT VISIT: 1 Visit                     Devonte Migues B. [name] PT, DPT Acute Rehabilitation Services Please use secure chat or  Call Office [phone]    [name] [name] 01/03/2024, 12:11 PM

## 2024-01-03 NOTE — Progress Notes (Signed)
 Occupational Therapy Treatment Patient Details Name: Derek Thompson MRN: [id number] DOB: 1960-11-21 Today's Date: 01/03/2024   History of present illness Pt is a 63 y/o M presenting to ED on 11/02/23 with lethargy. Pt found to be tachycardic, hyperkalemic, and hyponatremic. 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 Pt transferred to icu on 8/29. Transferred back to progressive floor 9/1 and back to ICU 9/2 for treatment of tacycardia, left ventricular thrombus and septic shock PMH: urinary retention, HTN, DM2, CVA in February 2025, anemia, prostate cancer with chronic incontinence following radiation therapy, recent osteomyelitis of the right foot and LV thrombus; s/p R foot 1st and 2nd ray amputation, HFrEF (EF 20-25%).   OT comments  Pt seen after transfer to CVICU. Appearing weak with decreased activity tolerance. Min assist to fully roll for pericare, linen change and replaced soiled sacral pad. Pt appears aware of his guarded prognosis, communicating this to family member by phone. Goals downgraded to reflect pt's decline in functional status. Noted palliative care referral. Will continue to follow.       If plan is discharge home, recommend the following:  A lot of help with walking and/or transfers;A lot of help with bathing/dressing/bathroom;Assistance with cooking/housework;Direct supervision/assist for medications management;Direct supervision/assist for financial management;Assist for transportation;Help with stairs or ramp for entrance   Equipment Recommendations  Other (comment) (defer)    Recommendations for Other Services      Precautions / Restrictions Precautions Precautions: Fall;Other (comment) Recall of Precautions/Restrictions: Impaired Precaution/Restrictions Comments: R BKA- no pillows under knee Required Braces or Orthoses: Other Brace Other Brace: prosthetist request use of limb protector in bed to keep knee extension at night        Mobility Bed Mobility Overal bed mobility: Needs Assistance Bed Mobility: Rolling Rolling: Min assist         General bed mobility comments: increased effort able to roll onto L and R side for pericare, assist to completely roll for pericare and placement of sacral pad    Transfers                   General transfer comment: unable this date     Balance                                           ADL either performed or assessed with clinical judgement   ADL Overall ADL's : Needs assistance/impaired                             Toileting- Clothing Manipulation and Hygiene: Total assistance;Bed level;+2 for physical assistance              Extremity/Trunk Assessment              Vision       Perception     Praxis     Communication Communication Communication: Impaired Factors Affecting Communication: Difficulty expressing self   Cognition Arousal: Alert Behavior During Therapy: Flat affect Cognition: Cognition impaired             OT - Cognition Comments: pt aware of the gravity of his medical situation, communicated this to family member by phone                 Following commands: Impaired Following commands impaired: Follows one step commands with  increased time      Cueing   Cueing Techniques: Verbal cues, Gestural cues, Tactile cues  Exercises      Shoulder Instructions       General Comments HR stabilized in the 90s    Pertinent Vitals/ Pain       Pain Assessment Pain Assessment: Faces Faces Pain Scale: Hurts little more Pain Location: generalized Pain Descriptors / Indicators: Grimacing, Tiring Pain Intervention(s): Monitored during session, Repositioned  Home Living Family/patient expects to be discharged to:: Skilled nursing facility                                        Prior Functioning/Environment              Frequency  Min 5X/week         Progress Toward Goals  OT Goals(current goals can now be found in the care plan section)  Progress towards OT goals: Goals drowngraded-see care plan  Acute Rehab OT Goals OT Goal Formulation: With patient Time For Goal Achievement: 02/13/24 Potential to Achieve Goals: Fair ADL Goals Pt Will Perform Grooming: with set-up;sitting Pt Will Perform Lower Body Bathing: with min assist;sitting/lateral leans Pt Will Perform Lower Body Dressing: with min assist;sitting/lateral leans Pt Will Transfer to Toilet: with mod assist;stand pivot transfer;bedside commode Pt Will Perform Toileting - Clothing Manipulation and hygiene: with min assist;sitting/lateral leans  Plan      Co-evaluation    PT/OT/SLP Co-Evaluation/Treatment: Yes Reason for Co-Treatment: For patient/therapist safety;To address functional/ADL transfers PT goals addressed during session: Mobility/safety with mobility OT goals addressed during session: ADL's and self-care      AM-PAC OT 6 Clicks Daily Activity     Outcome Measure   Help from another person eating meals?: None Help from another person taking care of personal grooming?: A Little Help from another person toileting, which includes using toliet, bedpan, or urinal?: Total Help from another person bathing (including washing, rinsing, drying)?: A Lot Help from another person to put on and taking off regular upper body clothing?: A Little Help from another person to put on and taking off regular lower body clothing?: Total 6 Click Score: 14    End of Session    OT Visit Diagnosis: Unsteadiness on feet (R26.81);Other abnormalities of gait and mobility (R26.89);Muscle weakness (generalized) (M62.81);Other symptoms and signs involving cognitive function   Activity Tolerance Patient limited by fatigue   Patient Left in bed;with call bell/phone within reach;with bed alarm set   Nurse Communication          Time: 1129-1150 OT Time Calculation (min): 21  min  Charges: OT General Charges $OT Visit: 1 Visit  [name], OTR/L Acute Rehabilitation Services Office: [phone]   [name] 01/03/2024, 1:24 PM

## 2024-01-03 NOTE — Progress Notes (Addendum)
 PHARMACY - ANTICOAGULATION CONSULT NOTE  Pharmacy Consult:  Heparin  Indication: LV thrombus  Allergies  Allergen Reactions   Cefepime  Other (See Comments)    Cefepime  neurotoxicity 11/07/23    Patient Measurements: Height: 6' (182.9 cm) Weight: 58.1 kg (128 lb 1.4 oz) IBW/kg (Calculated) : 77.6 HEPARIN  DW (KG): 42.1  Vital Signs: Temp: 99 F (37.2 C) (09/03 0758) Temp Source: Axillary (09/03 0758) BP: 103/67 (09/03 0700) Pulse Rate: 123 (09/03 0700)  Labs: Recent Labs    01/01/24 0142 01/01/24 0457 01/02/24 9367 01/02/24 0644 01/02/24 0645 01/02/24 1509 01/02/24 1849 01/03/24 0500  HGB 9.4*   < > 10.1*  --   --  10.2*  --  9.8*  HCT 28.5*   < > 30.1*  --   --  31.0*  --  29.8*  PLT  --    < > 231  --   --  251  --  209  APTT 53*   < >  --  57*  --   --  82* 72*  HEPARINUNFRC >1.10*  --   --   --  0.74*  --   --  0.47  CREATININE  --    < > 4.88*  --   --  5.05*  --  5.60*   < > = values in this interval not displayed.    Estimated Creatinine Clearance: 11 mL/min (A) (by C-G formula based on SCr of 5.6 mg/dL (H)).   Assessment: 47 YOM with  LV thrombus off Eliquis  due to hematuria, last dose 8/29 AM.  CBC stable and Pharmacy consulted to dose IV heparin .  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  Confirmed weight of ~42 kg.   aPTT 72 sec therapeutic on 1200 units/hr.  Heparin  level 0.47 correlating.  Hgb 9.8 (down slightly) s/p transfusions (last 9/1), pltc 209.  Now with frank hematuria and bloody clots.  No issues with heparin  gtt.   Goal of Therapy:  Heparin  level 0.3-0.5 units/ml aPTT 66-85 seconds d/t bleeding risk Monitor platelets by anticoagulation protocol: Yes   Plan:  Decrease heparin  gtt slightly to 1200 units/hr to target lower end of range Confirmatory 8h heparin  level/CBC Daily heparin  level and CBC Monitor for hematuria resolution  Maurilio Fila,  PharmD Clinical Pharmacist 01/03/2024  8:19 AM

## 2024-01-03 NOTE — Progress Notes (Signed)
 NAME:  Derek Thompson, MRN:  [id number], DOB:  26-Jul-1960, LOS: 61 ADMISSION DATE:  11/02/2023, CONSULTATION DATE:  829 REFERRING MD:  Shawn, CHIEF COMPLAINT:  shock   History of Present Illness:   Derek Thompson is a 63 year old homeless male with PMH of HFrEF, LV thrombus, T2DM with neuropathy, prior CVA 06/2023, CKD stage IIIa, iron  deficiency anemia, and BPH who presented on 11/02/2023 with progressive fatigue and was admitted for sepsis secondary to progressive right foot osteomyelitis and presumed UTI. 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 His course has been significant for suspected cathter associated UTI s/p IV antibiotics from 7/22-7/27. He also developed AKI on CKD and further work-up revealed multiple myeloma on bone marrow biopsy. In early August he was cleared for hospital discharge but was unable to find an accepting facility and did not have a safe discharge dispo due to homelessness. [facility] in the [facility]. On 8/29 he was transferred to the ICU for somnolence, hypotension and new fever with concern for mixed septic and cardiogenic shock. He was briefly vasopressors, started on dobutamine  and resumed on IV Zosyn  with presumed urinary source of infection. He was transferred to the floor on 9/1 and IMTS resumed care 9/2. He subsequently developed new fever, tachycardia and hypotension prompting PCCM consult and return to ICU.   Pertinent  Medical History  Heart failure with reduced EF, baseline in the 20 range, urinary retention secondary to history of prostate cancer with chronic Foley, chronic right lower extremity osteomyelitis, CKD stage IIIb.  Significant Hospital Events: Including procedures, antibiotic start and stop dates in addition to other pertinent events   7/4 Admitted 7/17 Bone marrow biopsy 7/18 s/p R BKA 8/27-8/28 rising BUN and Cr 8/28 new fever, hematuria, AMS and shock requiring NE, echocardiogram showing EF<20% and moderate RV dysfunction, started on  dobutamine   9/3 off pressors and dobutamine , undergoing CBI  Interim History / Subjective:  No further arrhythmia overnight  Off pressors and dobutamine  Creatinine continues to slowly climb, foley obstructed this AM but seen by urology and cleared, continued CBI for hematuria on heparin   Feels generally unwell, though without specific complaints  Coox 64% and CVP low 1-2  Objective    Blood pressure 103/67, pulse (!) 123, temperature 99 F (37.2 C), temperature source Axillary, resp. rate (!) 21, height 6' (1.829 m), weight 58.1 kg, SpO2 95%. CVP:  [0 mmHg-16 mmHg] 6 mmHg      Intake/Output Summary (Last 24 hours) at 01/03/2024 1003 Last data filed at 01/03/2024 0900 Gross per 24 hour  Intake 9828.32 ml  Output 89199 ml  Net -971.68 ml   Filed Weights   01/01/24 0600 01/02/24 0349 01/03/24 0600  Weight: 42.7 kg 59 kg 58.1 kg     General:  thin, chronically ill appearing M resting in bed in NAD HEENT: MM pink/moist Neuro: alert and oriented, mo focal deficits  CV: s1s2 rrr, no m/r/g PULM:  clear bilaterally on RA GI/GU: soft, non-tender, CBI ongoing, no noted clots in hematuria  Extremities: warm/dry, s/p R BKA, no edema     Resolved problem list   Assessment and Plan   Shock, likely septic  Acute on chronic systolic heart failure Still suspecting septic shock most likely urinary  CT AP on 8/29 demonstrated bilateral hydronephrosis and hydroureter concerning for infectious cystitis with bilateral ascending urinary tract infection.  Less suspicious for cardiogenic component given off pressors and dobutamine  and coox is 64% -broaden abx to meropenem , repeat MRSA  screen and follow blood cultures drawn yesterday - Continue to trend Co-ox - Lactic cleared  - Follow up urine culture and blood cultures - Foley placed 8/29 - Continue to hold BB in the setting of acute shock - Hold ASA in the setting of hematuria - Hold statin given transaminitis  - Advanced heart failure  team following, appreciate recommendations - Palliative care consulted    Known LV thrombus Previously on Eliquis .  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, due to monoclonal gammopathy  History of urinary retention and prostate cancer and radiation therapy with chronic foley Hematuria  Acute on chronic anemia Baseline creatinine felt to be in the mid 2-3 range, has been slowly up-trending  -consult nephrology to evaluated whether patient would be a candidate for RRT  - Avoid nephrotoxins, renally dose medications and ensure adequate renal perfusion -appreciate urology recs, continue CBI for several days, not a good candidate for anesthesia/cystoscopy currently  -transfuse for Hgb <7    Acute on chronic hyponatremia Likely multifactorial including pseuohyponatremia in the setting of monoclonal gammopathy and heart failure  -continue 1200cc fluid restriction  Type 2 diabetes with hyperglycemia - Continue SSI and Lantus   Hypoalbuminemia Protein calorie malnutrition  - Supportive care, nutritional support  Chronic osteomyelitis s/p R BKA (7/18)  Deconditioning - PT/OT when appropriate   Elevated protein gap with monoclonal gammopathy  and suspected multiple myeloma from bone marrow biopsy on 7/17 -seen by oncology  - Plan for outpatient follow-up   Labs   CBC: Recent Labs  Lab 12/29/23 0926 12/29/23 1323 12/31/23 1400 01/01/24 0142 01/01/24 0457 01/01/24 1328 01/02/24 0632 01/02/24 1509 01/03/24 0500  WBC 18.0*   < > 6.1  --  9.0  --  8.3 14.6* 18.4*  NEUTROABS 16.7*  --   --   --   --   --   --  13.5*  --   HGB 8.0*   < > 6.2*   < > 9.8* 9.6* 10.1* 10.2* 9.8*  HCT 24.5*   < > 19.2*   < > 28.9* 28.6* 30.1* 31.0* 29.8*  MCV 84.2   < > 88.1  --  83.5  --  83.8 84.9 85.9  PLT 292   < > 177  --  236  --  231 251 209   < > = values in this interval not displayed.    Basic Metabolic Panel: Recent Labs  Lab  12/28/23 0531 12/29/23 0422 12/29/23 1200 12/31/23 0400 01/01/24 0457 01/02/24 0632 01/02/24 1509 01/02/24 1849 01/03/24 0500  NA 130* 121*   < > 123* 126* 129* 127*  --  125*  K 4.8 5.1   < > 4.1 4.1 4.3 4.2  --  4.5  CL 91* 84*   < > 90* 91* 92* 93*  --  93*  CO2 27 20*   < > 21* 22 21* 19*  --  17*  GLUCOSE 102* 183*   < > 88 129* 110* 107*  --  81  BUN 116* 126*   < > 127* 112* 100* 109*  --  106*  CREATININE 3.48* 3.96*   < > 5.03* 5.02* 4.88* 5.05*  --  5.60*  CALCIUM  9.2 8.8*   < > 8.1* 8.3* 8.3* 8.0*  --  8.1*  MG  --   --   --   --  2.2 2.4  --  2.2  --   PHOS 4.7* 3.5  --   --  5.1* 4.6  --   --   --    < > = values in this interval not displayed.   GFR: Estimated Creatinine Clearance: 11 mL/min (A) (by C-G formula based on SCr of 5.6 mg/dL (H)). Recent Labs  Lab 12/29/23 1231 12/29/23 1323 12/29/23 1549 12/29/23 2135 12/30/23 0332 12/30/23 1047 12/30/23 1601 01/01/24 0457 01/02/24 0632 01/02/24 1509 01/02/24 1639 01/03/24 0500  PROCALCITON 0.86  --   --   --   --   --   --   --  3.29  --   --   --   WBC  --    < >  --   --    < >  --    < > 9.0 8.3 14.6*  --  18.4*  LATICACIDVEN  --   --  6.6* 1.7  --  1.4  --   --   --   --  0.9  --    < > = values in this interval not displayed.    Liver Function Tests: Recent Labs  Lab 12/28/23 0531 12/29/23 0422 12/29/23 0926 01/02/24 1509  AST  --   --  28 178*  ALT  --   --  23 372*  ALKPHOS  --   --  51 43  BILITOT  --   --  0.8 0.6  PROT  --   --  8.3* 7.4  ALBUMIN 2.2* 2.3* 2.3* 1.8*   No results for input(s): LIPASE, AMYLASE in the last 168 hours. No results for input(s): AMMONIA in the last 168 hours.  ABG    Component Value Date/Time   PHART 7.488 (H) 12/29/2023 1620   PCO2ART 20.9 (L) 12/29/2023 1620   PO2ART 98 12/29/2023 1620   HCO3 15.9 (L) 12/29/2023 1620   TCO2 17 (L) 12/29/2023 1620   ACIDBASEDEF 7.0 (H) 12/29/2023 1620   O2SAT 69.1 01/03/2024 0500     Coagulation  Profile: No results for input(s): INR, PROTIME in the last 168 hours.  Cardiac Enzymes: Recent Labs  Lab 12/29/23 1200  CKTOTAL 257  CKMB 27.4*    HbA1C: Hgb A1c MFr Bld  Date/Time Value Ref Range Status  09/02/2023 06:15 AM 8.7 (H) 4.8 - 5.6 % Final    Comment:    (NOTE) Pre diabetes:          5.7%-6.4%  Diabetes:              >6.4%  Glycemic control for   <7.0% adults with diabetes   06/13/2023 07:40 PM 13.3 (H) 4.8 - 5.6 % Final    Comment:    (NOTE) Pre diabetes:          5.7%-6.4%  Diabetes:              >6.4%  Glycemic control for   <7.0% adults with diabetes     CBG: Recent Labs  Lab 01/02/24 1630 01/02/24 2054 01/02/24 2323 01/03/24 0323 01/03/24 0756  GLUCAP 102* 96 90 79 90       CRITICAL CARE Performed by: Leita SAUNDERS Dickie Cloe   Total critical care time: 40 minutes  Critical care time was exclusive of separately billable procedures and treating other patients.  Critical care was necessary to treat or prevent imminent or life-threatening deterioration.  Critical care was time spent personally by me on the following activities: development of treatment plan with patient and/or surrogate as well as nursing, discussions with consultants, evaluation of patient's response to treatment, examination  of patient, obtaining history from patient or surrogate, ordering and performing treatments and interventions, ordering and review of laboratory studies, ordering and review of radiographic studies, pulse oximetry and re-evaluation of patient's condition.    Leita SAUNDERS Dwanda Tufano, PA-C [facility] See Amion for pager If no response to pager , please call [phone] until 7pm After 7:00 pm call Elink  [phone]

## 2024-01-03 NOTE — Progress Notes (Addendum)
 Patient ID: Derek Thompson, male   DOB: 1960/12/28, 63 y.o.   MRN: [id number]     Advanced Heart Failure Rounding Note  Cardiologist: Vinie JAYSON Maxcy, MD   Chief Complaint: Shock  Subjective:    Transferred back to the ICU 09/2 d/t concern for septic shock. New fever, hypotension and worsening leukocytosis. Given adenosine  followed by IV amiodarone  for wide QRS tachycardia (SVT vs ST).  He is off NE and dobutamine . CO-OX 69%.   T 102.9 F this am. WBCs 18K. Abx switched to meropenem  by CCM.   Currently on CBI for hematuria.  No complaints. He is wondering how he is doing overall.   Objective:   Weight Range: 58.1 kg Body mass index is 17.37 kg/m.   Vital Signs:   Temp:  [98 F (36.7 C)-103.6 F (39.8 C)] 99 F (37.2 C) (09/03 0758) Pulse Rate:  [96-152] 96 (09/03 1000) Resp:  [6-37] 15 (09/03 1000) BP: (92-122)/(54-84) 103/54 (09/03 0900) SpO2:  [92 %-100 %] 97 % (09/03 1000) Weight:  [58.1 kg] 58.1 kg (09/03 0600) Last BM Date : 01/03/24  Weight change: Filed Weights   01/01/24 0600 01/02/24 0349 01/03/24 0600  Weight: 42.7 kg 59 kg 58.1 kg    Intake/Output:   Intake/Output Summary (Last 24 hours) at 01/03/2024 1104 Last data filed at 01/03/2024 1038 Gross per 24 hour  Intake 12851.93 ml  Output 87649 ml  Net 501.93 ml      Physical Exam    General:  Chronically ill appearing Cor: JVP flat. Regular rate & rhythm, tachy. No murmurs. Lungs: breathing nonlabored GU: Bright red urine in foley bag Extremities: R BKA, no edema Neuro: alert & orientedx3. Affect flat   Telemetry   NSR/ST 90s-100s  Labs    CBC Recent Labs    01/02/24 1509 01/03/24 0500  WBC 14.6* 18.4*  NEUTROABS 13.5*  --   HGB 10.2* 9.8*  HCT 31.0* 29.8*  MCV 84.9 85.9  PLT 251 209   Basic Metabolic Panel Recent Labs    90/98/74 0457 01/02/24 0632 01/02/24 1509 01/02/24 1849 01/03/24 0500  NA 126* 129* 127*  --  125*  K 4.1 4.3 4.2  --  4.5  CL 91* 92* 93*  --   93*  CO2 22 21* 19*  --  17*  GLUCOSE 129* 110* 107*  --  81  BUN 112* 100* 109*  --  106*  CREATININE 5.02* 4.88* 5.05*  --  5.60*  CALCIUM  8.3* 8.3* 8.0*  --  8.1*  MG 2.2 2.4  --  2.2  --   PHOS 5.1* 4.6  --   --   --    Liver Function Tests Recent Labs    01/02/24 1509  AST 178*  ALT 372*  ALKPHOS 43  BILITOT 0.6  PROT 7.4  ALBUMIN 1.8*    No results for input(s): LIPASE, AMYLASE in the last 72 hours. Cardiac Enzymes No results for input(s): CKTOTAL, CKMB, CKMBINDEX, TROPONINI in the last 72 hours.   BNP: BNP (last 3 results) Recent Labs    06/29/23 0447 11/02/23 1552 11/03/23 0824  BNP 3,373.6* 4,136.5* 3,980.4*    ProBNP (last 3 results) No results for input(s): PROBNP in the last 8760 hours.   D-Dimer No results for input(s): DDIMER in the last 72 hours. Hemoglobin A1C No results for input(s): HGBA1C in the last 72 hours. Fasting Lipid Panel No results for input(s): CHOL, HDL, LDLCALC, TRIG, CHOLHDL, LDLDIRECT in the last 72 hours. Thyroid  Function Tests No results for input(s): TSH, T4TOTAL, T3FREE, THYROIDAB in the last 72 hours.  Invalid input(s): FREET3  Other results:   Imaging    DG Chest Port 1 View Result Date: 01/02/2024 CLINICAL DATA:  [id number] Sepsis [facility]) [id number] EXAM: PORTABLE CHEST 1 VIEW COMPARISON:  December 30, 2023 FINDINGS: The cardiomediastinal silhouette is unchanged in contour.RIGHT neck CVC with tip terminating over the expected area of the superior cavoatrial junction. Atherosclerotic calcifications. No pleural effusion. No pneumothorax. No acute pleuroparenchymal abnormality. IMPRESSION: No acute cardiopulmonary abnormality. Electronically Signed   By: Corean Salter M.D.   On: 01/02/2024 17:09      Medications:     Scheduled Medications:  acetaminophen   1,000 mg Oral Q6H   atorvastatin   80 mg Oral Daily   Chlorhexidine  Gluconate Cloth  6 each Topical Daily   ezetimibe   10 mg  Oral Daily   feeding supplement  237 mL Oral BID BM   Gerhardt's butt cream   Topical BID   insulin  aspart  0-9 Units Subcutaneous Q4H   loperamide   4 mg Oral Once   mupirocin  ointment   Nasal BID   sodium bicarbonate   100 mEq Intravenous Once    Infusions:  heparin  1,150 Units/hr (01/03/24 1000)   meropenem  (MERREM ) IV 1 g (01/03/24 1003)    PRN Medications: mouth rinse, polyethylene glycol    Assessment/Plan   1. Shock: Suspect mixed septic/cardiogenic shock, transferred to ICU 08/29. Resumed Zosyn  for urinary source.   - CT AP on 8/29 demonstrated bilateral hydronephrosis and hydroureter concerning for infectious cystitis with bilateral ascending urinary tract infection.  - Echo with EF < 20%, LV thrombus, moderate RV dysfunction.   - Initially got better then worse on 09/02 w/ fever, hypotension and tachycardia. DBA stopped 09/02 with concern for developing septic shock and briefly required NE. Abx broadened to meropenem  by CCM this am. - CO-OX stable today. Lactic acid 0.9 on 09/2.  Volume status low on exam. No diuretics today.   2. Acute on chronic systolic CHF: Echo with EF < 20%, LV thrombus, moderate RV dysfunction.  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  Last cath in 6/24 showed 70% proximal LCx stenosis. Developed shock, now resolved, see plan above.   3.  Gross hematuria: Suspect due to chronic foley + UTI.  - Now on continuous CBI per Urology  4. 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   - Continue heparin  gtt > eventually eliquis .      5. Multiple myeloma: New diagnosis this admission.   6. 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  7. AKI on CKD stage 3: Baseline creatinine 1.5.  -Scr has been 4.5-5 over the last week -Suspect due to hypotension/shock.  -Scr up further to 5.6 today. CO2 17 and BUN > 100. Nephrology consulted. Not sure he would be able to tolerate HD  8. Acute on chronic hyponatremia:  - Na 125 today - In  setting of monoclonal gammopathy (?pseudohyponatremia) and CHF   Prognosis is guarded. Has significant MSOF.  Length of Stay: 83  FINCH, Derek N, PA-C  01/03/2024, 11:04 AM  Advanced Heart Failure Team Pager [phone] (M-F; 7a - 5p)  Please contact [facility] for night-coverage after hours (5p -7a ) and weekends on [email or web address]   Patient seen and examined with the above-signed Advanced Practice Provider and/or Housestaff. I personally reviewed laboratory data, imaging studies and relevant notes. I independently examined the patient and formulated the important aspects of the plan. I  have edited the note to reflect any of my changes or salient points. I have personally discussed the plan with the patient and/or family.  He was transferred back to the ICU overnight due to hypotension from septic physiology.  He was on pressors overnight but these have been weaned off.  White count has gone from 8000-18,000.  Blood cultures have been drawn and antibiotics have not been changed.  He denies chest pain or shortness of breath.  Creatinine is 5.6 today BUN of greater than 100.  He also developed hematuria and now has a three-way Foley in place.  Sitting up in bed chronically ill-appearing no acute distress. Neck is supple with no JVD. Cardiac is regular rate and rhythm Lungs are clear. Abdomen soft nontender nondistended. Extremities are warm and well-perfused.  Has post right BKA three-way Foley catheter in place with bladder irrigation Neuro alert and orient x 4 cranial nerves intact nonfocal  Suspect he developed recurrent septic physiology overnight.  Transiently treated with norepinephrine  and dobutamine .  These are now off.  Co. ox is 69% consistent with normal cardiac output despite his severe LV dysfunction.  I have discussed the case with CCM and our heart failure Pharm.D.  Will broaden his antibiotics to meropenem .  Await culture results.  GDMT for his heart failure is limited by  recent hypotension CKD 5.  Will follow as a distance as we have little else to offer him at this time.  Derek Fuel, MD  11:32 AM

## 2024-01-03 NOTE — Progress Notes (Addendum)
 PHARMACY - ANTICOAGULATION CONSULT NOTE  Pharmacy Consult:  Heparin  Indication: LV thrombus  Allergies  Allergen Reactions   Cefepime  Other (See Comments)    Cefepime  neurotoxicity 11/07/23    Patient Measurements: Height: 6' (182.9 cm) Weight: 58.1 kg (128 lb 1.4 oz) IBW/kg (Calculated) : 77.6 HEPARIN  DW (KG): 42.1  Vital Signs: Temp: 98.7 F (37.1 C) (09/03 1125) Temp Source: Oral (09/03 1125) BP: 127/79 (09/03 1800) Pulse Rate: 119 (09/03 1800)  Labs: Recent Labs    01/02/24 9367 01/02/24 0644 01/02/24 0645 01/02/24 1509 01/02/24 1849 01/03/24 0500 01/03/24 1700  HGB 10.1*  --   --  10.2*  --  9.8* 9.4*  HCT 30.1*  --   --  31.0*  --  29.8* 28.4*  PLT 231  --   --  251  --  209 201  APTT  --  57*  --   --  82* 72*  --   HEPARINUNFRC  --   --  0.74*  --   --  0.47 0.37  CREATININE 4.88*  --   --  5.05*  --  5.60*  --     Estimated Creatinine Clearance: 11 mL/min (A) (by C-G formula based on SCr of 5.6 mg/dL (H)).   Assessment: 9 YOM with  LV thrombus off Eliquis  due to hematuria, last dose 8/29 AM.  CBC stable and Pharmacy consulted to dose IV heparin .  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  Confirmed weight of ~42 kg.   aPTT 72 sec therapeutic on 1200 units/hr.  Heparin  level 0.47 correlating.  Hgb 9.8 (down slightly) s/p transfusions (last 9/1), pltc 209.  Now with frank hematuria and bloody clots.  No issues with heparin  gtt.   Heparin  level came back in range again this PM. Hgb stable at 9.4. Still with ongoing hematuria issue. We will continue at the current rate and monitor.   Goal of Therapy:  Heparin  level 0.3-0.5 units/ml aPTT 66-85 seconds d/t bleeding risk Monitor platelets by anticoagulation protocol: Yes   Plan:  Cont heparin  gtt 1150 units/hr to target lower end of range Daily heparin  level and CBC Monitor for hematuria resolution  Sergio Batch, PharmD, BCIDP, AAHIVP, CPP Infectious Disease Pharmacist 01/03/2024 6:57 PM

## 2024-01-03 NOTE — TOC Progression Note (Signed)
 Transition of Care [facility]) - Progression Note    Patient Details  Name: Derek Thompson MRN: [id number] Date of Birth: 1961/02/24  [facility] CM/SW Contact  Isaiah Public, LCSWA Phone Number: 01/03/2024, 3:28 PM  Clinical Narrative:     [facility] working with [facility]. [facility]. TOC will continue to follow.  Expected Discharge Plan: Skilled Nursing Facility Barriers to Discharge: Insurance Authorization               Expected Discharge Plan and Services In-house Referral: Clinical Social Work   Post Acute Care Choice: Skilled Nursing Facility Living arrangements for the past 2 months: Homeless Shelter                                       Social Drivers of Health (SDOH) Interventions SDOH Screenings   Food Insecurity: No Food Insecurity (11/02/2023)  Housing: High Risk (11/02/2023)  Transportation Needs: Unmet Transportation Needs (11/02/2023)  Utilities: Not At Risk (11/02/2023)  Alcohol Screen: Low Risk  (08/19/2022)  Depression (PHQ2-9): Low Risk  (10/11/2023)  Financial Resource Strain: Low Risk  (08/19/2022)  Tobacco Use: Medium Risk (11/17/2023)    Readmission Risk Interventions     No data to display

## 2024-01-03 NOTE — Progress Notes (Incomplete)
 Palliative Medicine Progress Note   Patient Name: Derek Thompson       Date: 01/03/2024 DOB: 1961/03/13  Age: 63 y.o. MRN#: [id number] Attending Physician: Harold Scholz, MD Primary Care Physician: Elicia Sharper, DO Admit Date: 11/02/2023  Reason for Consultation/Follow-up: {Reason for Consult:23484}  HPI/Patient Profile: 63 y.o. 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  Palliative Medicine has been consulted for goals of care discussions.   Subjective: ***  Objective:  Physical Exam Vitals reviewed.  Constitutional:      General: He is not in acute distress.    Appearance: He is ill-appearing.  Pulmonary:     Effort: Pulmonary effort is normal.  Neurological:     Mental Status: He is alert and oriented to person, place, and time.             Vital Signs: BP 127/79   Pulse (!) 119   Temp 98.7 F (37.1 C) (Oral)   Resp (!) 22   Ht 6' (1.829 m)   Wt 58.1 kg   SpO2 100%   BMI 17.37 kg/m  SpO2: SpO2: 100 % O2 Device: O2 Device: Room Air O2 Flow Rate: O2 Flow Rate (L/min): 2 L/min  Intake/output summary:  Intake/Output Summary (Last 24 hours) at 01/03/2024 1805 Last data filed at 01/03/2024 1800 Gross per 24 hour  Intake 24814.03 ml  Output 79499 ml  Net 4314.03 ml    LBM: Last BM Date : 01/03/24     Palliative Assessment/Data: ***     Palliative Medicine Assessment & Plan   Assessment: Principal Problem:   Osteomyelitis of right foot (HCC) Active Problems:   Acute on chronic systolic heart failure  (HCC)   CAD (coronary artery disease)   Hyperkalemia   AKI (acute kidney injury) (HCC)   Anemia   LV (left ventricular) mural thrombus without MI (HCC)   Gangrene of right foot (HCC)   Hyponatremia   HFrEF (heart failure with reduced ejection fraction) (HCC)   Chronic osteomyelitis involving lower leg, right (HCC)   Frailty   Urinary retention   Generalized weakness  Sinus tachycardia   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 (HCC)   Chronic anemia   Hypoalbuminemia   Monoclonal gammopathy   Hx of right BKA (HCC)   Moderate protein-calorie malnutrition (HCC)    Recommendations/Plan: ***  Goals of Care and Additional Recommendations: Limitations on Scope of Treatment: {Recommended Scope and Preferences:21019}  Code Status:   Prognosis:  {Palliative Care Prognosis:23504}  Discharge Planning: {Palliative dispostion:23505}  Care plan was discussed with ***  Thank you for allowing the Palliative Medicine Team to assist in the care of this patient.   ***   Recardo KATHEE Loll, NP   Please contact Palliative Medicine Team phone at [phone] for questions and concerns.  For individual providers, please see AMION.

## 2024-01-03 NOTE — Progress Notes (Signed)
   47 Days Post-Op Subjective: First time meeting patient.  He was alert, oriented, and in no distress.  He remains in critical condition in CVICU.  CBI was clotted on arrival  Objective: Vital signs in last 24 hours: Temp:  [98 F (36.7 C)-103.6 F (39.8 C)] 99 F (37.2 C) (09/03 0758) Pulse Rate:  [96-152] 96 (09/03 1000) Resp:  [6-37] 15 (09/03 1000) BP: (92-122)/(54-84) 103/54 (09/03 0900) SpO2:  [92 %-100 %] 97 % (09/03 1000) Weight:  [58.1 kg] 58.1 kg (09/03 0600)  Assessment/Plan: 63 year old male with Hx of grade 5 prostate cancer-s/p radiation and ADT in 2022, subsequently lost to follow-up.  EF below 20% with gangrene of left leg, s/p BKA.  # radiation cystitis # Hemorrhagic cystitis? # Gross hematuria # Clot obstruction of urine  Hand irrigated a small amount of clot material and was able to get catheter flowing without too much difficulty.  I does feel that it may be a larger clot that the catheter is sticking on intermittently.  Unfortunately patient's condition has been significantly complicated by his left ventricular thrombus, requiring a heparin  drip.  He will likely continue to have hematuria for the foreseeable future.  His anticoagulation will make it extremely difficult for the CBI to make a lasting difference, with active LV thrombus he is not a candidate for TXA, and with ejection fraction estimated to be as low as 13%, he is not a candidate for cystoscopy with clot evacuation and fulguration.    Continue serial H&H.  Transfuse for Hgb < 8.0.  Hand irrigate as needed  Urology will follow  Intake/Output from previous day: 09/02 0701 - 09/03 0700 In: 9766.3 [I.V.:556.5; IV Piggyback:209.9] Out: 9950 [Urine:9950]  Intake/Output this shift: Total I/O In: 62 [I.V.:12; IV Piggyback:50] Out: 2400 [Urine:2400]  Physical Exam:  General: Alert and oriented CV: No cyanosis Lungs: equal chest rise Abdomen: Soft, NTND, no rebound or guarding Gu: Three-way  hematuria catheter in place with CBI on low gtt.  Lab Results: Recent Labs    01/02/24 0632 01/02/24 1509 01/03/24 0500  HGB 10.1* 10.2* 9.8*  HCT 30.1* 31.0* 29.8*   BMET Recent Labs    01/02/24 1509 01/03/24 0500  NA 127* 125*  K 4.2 4.5  CL 93* 93*  CO2 19* 17*  GLUCOSE 107* 81  BUN 109* 106*  CREATININE 5.05* 5.60*  CALCIUM  8.0* 8.1*  HGB 10.2* 9.8*  WBC 14.6* 18.4*     Studies/Results: DG Chest Port 1 View Result Date: 01/02/2024 CLINICAL DATA:  [id number] Sepsis [facility]) [id number] EXAM: PORTABLE CHEST 1 VIEW COMPARISON:  December 30, 2023 FINDINGS: The cardiomediastinal silhouette is unchanged in contour.RIGHT neck CVC with tip terminating over the expected area of the superior cavoatrial junction. Atherosclerotic calcifications. No pleural effusion. No pneumothorax. No acute pleuroparenchymal abnormality. IMPRESSION: No acute cardiopulmonary abnormality. Electronically Signed   By: Corean Salter M.D.   On: 01/02/2024 17:09      LOS: 61 days   Ole Bourdon, NP [facility] Pager: [phone]  01/03/2024, 10:24 AM

## 2024-01-04 DIAGNOSIS — I240 Acute coronary thrombosis not resulting in myocardial infarction: Secondary | ICD-10-CM

## 2024-01-04 DIAGNOSIS — R6521 Severe sepsis with septic shock: Secondary | ICD-10-CM | POA: Diagnosis not present

## 2024-01-04 DIAGNOSIS — N3001 Acute cystitis with hematuria: Secondary | ICD-10-CM

## 2024-01-04 DIAGNOSIS — E43 Unspecified severe protein-calorie malnutrition: Secondary | ICD-10-CM | POA: Insufficient documentation

## 2024-01-04 DIAGNOSIS — I5022 Chronic systolic (congestive) heart failure: Secondary | ICD-10-CM | POA: Diagnosis not present

## 2024-01-04 DIAGNOSIS — D472 Monoclonal gammopathy: Secondary | ICD-10-CM | POA: Diagnosis not present

## 2024-01-04 DIAGNOSIS — I236 Thrombosis of atrium, auricular appendage, and ventricle as current complications following acute myocardial infarction: Secondary | ICD-10-CM

## 2024-01-04 DIAGNOSIS — N179 Acute kidney failure, unspecified: Secondary | ICD-10-CM | POA: Diagnosis not present

## 2024-01-04 DIAGNOSIS — A419 Sepsis, unspecified organism: Secondary | ICD-10-CM | POA: Diagnosis not present

## 2024-01-04 DIAGNOSIS — R579 Shock, unspecified: Secondary | ICD-10-CM | POA: Diagnosis not present

## 2024-01-04 DIAGNOSIS — B377 Candidal sepsis: Secondary | ICD-10-CM | POA: Diagnosis not present

## 2024-01-04 DIAGNOSIS — N1832 Chronic kidney disease, stage 3b: Secondary | ICD-10-CM | POA: Diagnosis not present

## 2024-01-04 LAB — GLUCOSE, CAPILLARY
Glucose-Capillary: 185 mg/dL — ABNORMAL HIGH (ref 70–99)
Glucose-Capillary: 192 mg/dL — ABNORMAL HIGH (ref 70–99)
Glucose-Capillary: 200 mg/dL — ABNORMAL HIGH (ref 70–99)
Glucose-Capillary: 219 mg/dL — ABNORMAL HIGH (ref 70–99)
Glucose-Capillary: 293 mg/dL — ABNORMAL HIGH (ref 70–99)
Glucose-Capillary: 341 mg/dL — ABNORMAL HIGH (ref 70–99)
Glucose-Capillary: 360 mg/dL — ABNORMAL HIGH (ref 70–99)
Glucose-Capillary: 375 mg/dL — ABNORMAL HIGH (ref 70–99)
Glucose-Capillary: 416 mg/dL — ABNORMAL HIGH (ref 70–99)

## 2024-01-04 LAB — BLOOD CULTURE ID PANEL (REFLEXED) - BCID2

## 2024-01-04 LAB — BASIC METABOLIC PANEL WITH GFR
Anion gap: 18 — ABNORMAL HIGH (ref 5–15)
BUN: 114 mg/dL — ABNORMAL HIGH (ref 8–23)
CO2: 18 mmol/L — ABNORMAL LOW (ref 22–32)
Calcium: 8.1 mg/dL — ABNORMAL LOW (ref 8.9–10.3)
Chloride: 93 mmol/L — ABNORMAL LOW (ref 98–111)
Creatinine, Ser: 6.03 mg/dL — ABNORMAL HIGH (ref 0.61–1.24)
GFR, Estimated: 10 mL/min — ABNORMAL LOW (ref >60)
Glucose, Bld: 197 mg/dL — ABNORMAL HIGH (ref 70–99)
Potassium: 4.5 mmol/L (ref 3.5–5.1)
Sodium: 129 mmol/L — ABNORMAL LOW (ref 135–145)

## 2024-01-04 LAB — COOXEMETRY PANEL
Carboxyhemoglobin: 1.2 % (ref 0.5–1.5)
Methemoglobin: <0.7 % (ref 0.0–1.5)
O2 Saturation: 61 %
Total hemoglobin: 10.5 g/dL — ABNORMAL LOW (ref 12.0–16.0)

## 2024-01-04 LAB — CBC
HCT: 31.9 % — ABNORMAL LOW (ref 39.0–52.0)
Hemoglobin: 10.5 g/dL — ABNORMAL LOW (ref 13.0–17.0)
MCH: 28.5 pg (ref 26.0–34.0)
MCHC: 32.9 g/dL (ref 30.0–36.0)
MCV: 86.4 fL (ref 80.0–100.0)
Platelets: 222 K/uL (ref 150–400)
RBC: 3.69 MIL/uL — ABNORMAL LOW (ref 4.22–5.81)
RDW: 17 % — ABNORMAL HIGH (ref 11.5–15.5)
WBC: 9.6 K/uL (ref 4.0–10.5)
nRBC: 0 % (ref 0.0–0.2)

## 2024-01-04 LAB — LACTATE DEHYDROGENASE: LDH: 275 U/L — ABNORMAL HIGH (ref 98–192)

## 2024-01-04 LAB — HEPARIN LEVEL (UNFRACTIONATED): Heparin Unfractionated: 0.39 [IU]/mL (ref 0.30–0.70)

## 2024-01-04 LAB — MAGNESIUM: Magnesium: 2.3 mg/dL (ref 1.7–2.4)

## 2024-01-04 LAB — URIC ACID: Uric Acid, Serum: 6.8 mg/dL (ref 3.7–8.6)

## 2024-01-04 MED ORDER — SODIUM CHLORIDE 0.9 % IV SOLN
2.0000 g | INTRAVENOUS | Status: DC
  Administered 2024-01-04 – 2024-01-08 (×5): 2 g via INTRAVENOUS
  Filled 2024-01-04 (×5): qty 20

## 2024-01-04 MED ORDER — INSULIN ASPART 100 UNIT/ML IJ SOLN
0.0000 [IU] | INTRAMUSCULAR | Status: DC
  Administered 2024-01-05 (×2): 8 [IU] via SUBCUTANEOUS
  Administered 2024-01-05: 2 [IU] via SUBCUTANEOUS
  Administered 2024-01-05 – 2024-01-06 (×3): 5 [IU] via SUBCUTANEOUS
  Administered 2024-01-06: 3 [IU] via SUBCUTANEOUS
  Administered 2024-01-06: 5 [IU] via SUBCUTANEOUS
  Administered 2024-01-06: 3 [IU] via SUBCUTANEOUS
  Administered 2024-01-07 (×2): 2 [IU] via SUBCUTANEOUS
  Administered 2024-01-07: 3 [IU] via SUBCUTANEOUS
  Administered 2024-01-08: 2 [IU] via SUBCUTANEOUS

## 2024-01-04 MED ORDER — SODIUM CHLORIDE 0.9 % IV SOLN
100.0000 mg | Freq: Every day | INTRAVENOUS | Status: DC
  Administered 2024-01-04 – 2024-01-09 (×6): 100 mg via INTRAVENOUS
  Filled 2024-01-04 (×6): qty 5

## 2024-01-04 MED ORDER — INSULIN ASPART 100 UNIT/ML IJ SOLN
15.0000 [IU] | Freq: Once | INTRAMUSCULAR | Status: AC
  Administered 2024-01-04: 15 [IU] via SUBCUTANEOUS

## 2024-01-04 MED ORDER — CLONAZEPAM 0.5 MG PO TABS
0.5000 mg | ORAL_TABLET | Freq: Once | ORAL | Status: AC
  Administered 2024-01-04: 0.5 mg via ORAL
  Filled 2024-01-04: qty 1

## 2024-01-04 MED ORDER — SCOPOLAMINE 1 MG/3DAYS TD PT72
1.0000 | MEDICATED_PATCH | TRANSDERMAL | Status: DC | PRN
  Administered 2024-01-05 – 2024-01-09 (×2): 1 mg via TRANSDERMAL
  Filled 2024-01-04 (×4): qty 1

## 2024-01-04 MED ORDER — ENSURE PLUS HIGH PROTEIN PO LIQD
237.0000 mL | Freq: Three times a day (TID) | ORAL | Status: DC
  Administered 2024-01-04 – 2024-01-09 (×11): 237 mL via ORAL

## 2024-01-04 MED ORDER — THIAMINE MONONITRATE 100 MG PO TABS
100.0000 mg | ORAL_TABLET | Freq: Every day | ORAL | Status: DC
  Administered 2024-01-04 – 2024-01-09 (×6): 100 mg via ORAL
  Filled 2024-01-04 (×6): qty 1

## 2024-01-04 NOTE — Progress Notes (Signed)
 eLink Physician-Brief Progress Note Patient Name: Derek Thompson DOB: 1960-09-29 MRN: [id number]   Date of Service  01/04/2024  HPI/Events of Note  Patient is started on regular diet earlier today and is currently on dexamethasone .  Started developing hyperglycemia with CBG 416.  eICU Interventions  Maintain every 4 hour glucose checks, changed to moderate SSI, one-time 15 units aspart     Intervention Category Intermediate Interventions: Hyperglycemia - evaluation and treatment  Marletta Bousquet 01/04/2024, 11:35 PM

## 2024-01-04 NOTE — Progress Notes (Addendum)
 48 Days Post-Op Subjective: Complains of positional catheter drainage and clot obstruction overnight.  Objective: Vital signs in last 24 hours: Temp:  [97.6 F (36.4 C)] 97.6 F (36.4 C) (09/03 2000) Pulse Rate:  [83-119] 94 (09/04 1500) Resp:  [7-29] 12 (09/04 1500) BP: (74-127)/(54-81) 107/72 (09/04 1500) SpO2:  [82 %-100 %] 100 % (09/04 1500)  Assessment/Plan: 63 year old male with Hx of grade 5 prostate cancer-s/p radiation and ADT in 2022, subsequently lost to follow-up.  EF below 20% with gangrene of left leg, s/p BKA.  # radiation cystitis # Hemorrhagic cystitis? # Gross hematuria # Clot obstruction of urine #AKI  Based on the positional nature of the catheter, it is likely the patient has a consolidated clot in his bladder that cannot be surgically treated.  I attempted to hand irrigate with diluted peroxide.  While catheter did flow better afterwards, I did not successfully break up any clot material if it were present.  He is stuck between a rock and a hard place.  Having radiation cystitis and requiring anticoagulation means a little to no chance of stopping his bleeding.  His cardiac function is so compromised that he may not survive anesthesia/surgical intervention.  For now we will continue bladder irrigation, hand irrigating as needed, serial H&H, and as needed transfusion.  Guarded prognosis.  Renal function continues to worsen.  Not a candidate for dialysis.  Likely multifactorial between prerenal damage caused by his ejection fraction below 15 and new diagnosis of multiple myeloma.  Continuing IV ABX and antifungals.    Continue serial H&H.  Transfuse for Hgb < 8.0.  Hand irrigate as needed  Urology will follow  Intake/Output from previous day: 09/03 0701 - 09/04 0700 In: 15849.7 [P.O.:330; I.V.:139.7; IV Piggyback:150] Out: 61479 [Urine:38520]  Intake/Output this shift: Total I/O In: 15774.1 [P.O.:240; I.V.:229.1; Other:15000; IV Piggyback:305] Out:  76499 [Urine:23500]  Physical Exam:  General: Alert and oriented CV: No cyanosis Lungs: equal chest rise Abdomen: Soft, NTND, no rebound or guarding Gu: Three-way hematuria catheter in place with CBI on low gtt.  Lab Results: Recent Labs    01/03/24 0500 01/03/24 1700 01/04/24 0224  HGB 9.8* 9.4* 10.5*  HCT 29.8* 28.4* 31.9*   BMET Recent Labs    01/03/24 0500 01/03/24 1700 01/04/24 0224  NA 125*  --  129*  K 4.5  --  4.5  CL 93*  --  93*  CO2 17*  --  18*  GLUCOSE 81  --  197*  BUN 106*  --  114*  CREATININE 5.60*  --  6.03*  CALCIUM  8.1*  --  8.1*  HGB 9.8* 9.4* 10.5*  WBC 18.4* 13.6* 9.6     Studies/Results: US  RENAL Result Date: 01/03/2024 CLINICAL DATA:  Hydronephrosis. EXAM: RENAL / URINARY TRACT ULTRASOUND COMPLETE COMPARISON:  Ultrasound dated 10/20/2023 and CT abdomen pelvis dated 12/29/2023. FINDINGS: Right Kidney: Renal measurements: 11.6 x 5.7 x 6.7 cm = volume: 230 mL. Diffuse increased renal parenchymal echogenicity. There is moderate right hydronephrosis. No shadowing stone. There is dilatation of the visualized proximal right ureter. Left Kidney: Renal measurements: 11.7 x 5.1 x 5.5 cm = volume: 170 mL. Increased renal parenchymal echogenicity. Moderate left hydronephrosis. No shadowing stone. Faint echogenic content within the renal collecting system likely proteinaceous debris. Bladder: The urinary bladder is decompressed around a Foley catheter. There is diffuse sequential thickening of the bladder wall concerning for cystitis. Echogenic debris surrounds the Foley catheter within the bladder lumen may represent proteinaceous content or blood clot.  Other: None. IMPRESSION: 1. Moderate bilateral hydronephrosis.  No shadowing stone. 2. Debris within the left renal collecting system likely proteinaceous content and suggestive of pyelonephritis. Correlation with urinalysis recommended. 3. Thickened bladder wall. Complex debris within the bladder likely  proteinaceous or hemorrhagic content. Clinical correlation is recommended. Electronically Signed   By: Vanetta Chou M.D.   On: 01/03/2024 19:52   DG Chest Port 1 View Result Date: 01/02/2024 CLINICAL DATA:  [id number] Sepsis [facility]) [id number] EXAM: PORTABLE CHEST 1 VIEW COMPARISON:  December 30, 2023 FINDINGS: The cardiomediastinal silhouette is unchanged in contour.RIGHT neck CVC with tip terminating over the expected area of the superior cavoatrial junction. Atherosclerotic calcifications. No pleural effusion. No pneumothorax. No acute pleuroparenchymal abnormality. IMPRESSION: No acute cardiopulmonary abnormality. Electronically Signed   By: Corean Salter M.D.   On: 01/02/2024 17:09      LOS: 62 days   Ole Bourdon, NP [facility] Pager: [phone]  01/04/2024, 3:20 PM   I have seen and examined the patient and agree with the above assessment and plan.  Evaluated patient this afternoon and catheter is flowing much better. Clear on medium drip. Wean as able. Hand irrigate every 4 hours and prn for decreased clots or drainage. As above, not a candidate for surgical intervention presently. Continue CBI.  Matt R. Tosh Glaze MD [facility]  Pager: [phone]

## 2024-01-04 NOTE — Progress Notes (Signed)
 Physical Therapy Treatment Patient Details Name: Derek Thompson MRN: [id number] DOB: 01-Nov-1960 Today's Date: 01/04/2024   History of Present Illness Pt is a 63 y/o M presenting to ED on 11/02/23 with lethargy. Pt found to be tachycardic, hyperkalemic, and hyponatremic. 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 Pt transferred to icu on 8/29. 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    PT Comments  PT/OT STAR Session. Pt ate good breakfast this morning and is agreeable to working with therapy. Pt continues to have increased clotting in bladder causing increased abdominal pain. RN has just flushed, and pt with decreased pain prior to getting up to EoB. Pt provided more assist today with use of bed pad to reduce tension on catheter with scooting. Pt modAx2 for coming to EoB. Pt is able to perform 2x sit<>stand with modAx2, balance improved on second attempt. Nephrologist in room at end of session. Pt returned to supine with total A. PT will continue to follow acutely   If plan is discharge home, recommend the following: A lot of help with bathing/dressing/bathroom;Supervision due to cognitive status;Help with stairs or ramp for entrance;Assistance with cooking/housework;Assist for transportation;A little help with walking and/or transfers   Can travel by private vehicle     Yes  Equipment Recommendations  BSC/3in1;Wheelchair (measurements PT);Wheelchair cushion (measurements PT);Other (comment)       Precautions / Restrictions Precautions Precautions: Fall;Other (comment) Recall of Precautions/Restrictions:  Impaired Precaution/Restrictions Comments: NG Tube, Bladder irrigation, central line R BKA- no pillows under knee Required Braces or Orthoses: Other Brace Other Brace: prosthetist request use of limb protector in bed to keep knee extension at night Restrictions Weight Bearing Restrictions Per Provider Order: No RLE Weight Bearing Per Provider Order: Non weight bearing Other Position/Activity Restrictions: shrinker     Mobility  Bed Mobility Overal bed mobility: Needs Assistance       Supine to sit: Used rails Sit to supine: Mod assist, +2 for physical assistance Sit to sidelying: +2 for physical assistance, Total assist, +2 for safety/equipment General bed mobility comments: modAx2 for posterior support, with pad scoot of hips to EoB, Therapy provided increased assist to decrease friction on catheter with scooting. Pt requires totalAx2 for return to sidelying and for scooting up in bed    Transfers Overall transfer level: Needs assistance Equipment used: Rolling walker (2 wheels) Transfers: Sit to/from Stand, Bed to chair/wheelchair/BSC Sit to Stand: From elevated surface, Mod assist, +2 safety/equipment           General transfer comment: modAx2 for power up x2 for pericare, initial stand pt reports not feeling balanced increased L lateral lean, while in standing pt found to be incontinent of stool, on second attempt pt able to find better balance and stand for pericare. pt quickly fatigues and asks to sit.    Ambulation/Gait               General Gait Details: unable this date due to diarrhea   Stairs             Wheelchair Mobility     Tilt Bed    Modified Rankin (Stroke Patients Only)  Balance Overall balance assessment: Needs assistance Sitting-balance support: Bilateral upper extremity supported, Feet supported, Feet unsupported Sitting balance-Leahy Scale: Fair     Standing balance support: Bilateral upper extremity supported, During  functional activity Standing balance-Leahy Scale: Poor Standing balance comment: able to stand with moderate outside support                            Communication Communication Communication: Impaired Factors Affecting Communication: Difficulty expressing self  Cognition Arousal: Alert, Lethargic Behavior During Therapy: Flat affect   PT - Cognitive impairments: Problem solving                       PT - Cognition Comments: slowed responses, wants to participate Following commands: Impaired Following commands impaired: Follows one step commands with increased time    Cueing Cueing Techniques: Verbal cues, Gestural cues, Tactile cues  Exercises General Exercises - Lower Extremity Ankle Circles/Pumps: Strengthening, Left, 5 reps, Seated Quad Sets: AROM, Both, 5 reps, Seated Long Arc Quad: Strengthening, Both, 5 reps, Seated    General Comments General comments (skin integrity, edema, etc.): BP in supine 110/66 with sitting up BP 107/96 with return to supine BP 86/68 HR in 100s      Pertinent Vitals/Pain Pain Assessment Pain Assessment: Faces Faces Pain Scale: Hurts even more Pain Location: bladder with standing Pain Descriptors / Indicators: Grimacing, Tiring Pain Intervention(s): Limited activity within patient's tolerance, Monitored during session, Repositioned    Home Living Family/patient expects to be discharged to:: Skilled nursing facility                        Prior Function            PT Goals (current goals can now be found in the care plan section) Acute Rehab PT Goals Patient Stated Goal: to get to rehab PT Goal Formulation: With patient Time For Goal Achievement: 01/30/24 Potential to Achieve Goals: Good Progress towards PT goals: Progressing toward goals    Frequency    Min 2X/week      PT Plan      Co-evaluation PT/OT/SLP Co-Evaluation/Treatment: Yes Reason for Co-Treatment: To address functional/ADL  transfers (decreased functional ability) PT goals addressed during session: Mobility/safety with mobility OT goals addressed during session: ADL's and self-care      AM-PAC PT 6 Clicks Mobility   Outcome Measure  Help needed turning from your back to your side while in a flat bed without using bedrails?: A Lot Help needed moving from lying on your back to sitting on the side of a flat bed without using bedrails?: A Lot Help needed moving to and from a bed to a chair (including a wheelchair)?: Total Help needed standing up from a chair using your arms (e.g., wheelchair or bedside chair)?: Total Help needed to walk in hospital room?: Total Help needed climbing 3-5 steps with a railing? : Total 6 Click Score: 8    End of Session   Activity Tolerance: Patient limited by fatigue Patient left: with call bell/phone within reach;in chair;with chair alarm set Nurse Communication: Mobility status PT Visit Diagnosis: Unsteadiness on feet (R26.81);Difficulty in walking, not elsewhere classified (R26.2);Other abnormalities of gait and mobility (R26.89);Muscle weakness (generalized) (M62.81) Pain - Right/Left: Right Pain - part of body: Leg     Time: 9051-8993 PT Time Calculation (min) (ACUTE ONLY): 18 min  Charges:    $Therapeutic Activity: 8-22 mins PT  General Charges $$ ACUTE PT VISIT: 1 Visit                     Elisse Pennick B. [name] PT, DPT Acute Rehabilitation Services Please use secure chat or  Call Office [phone]    [name] [name] 01/04/2024, 11:12 AM

## 2024-01-04 NOTE — Progress Notes (Signed)
 Derek Thompson   DOB:1960/07/20   FM#:[id number]      ASSESSMENT & PLAN:  Derek Thompson is a 63 year old male patient who was admitted on 11/02/2023 from a [facility] due to lethargy. Workup showed abnormal proteins with M spike 1.1, elevated IgG level, and UPEP Bence-Jones protein positive.  Oncology/Dr. Autumn following.  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 Kappa/Lambda light chain ratio 0.58. IgG elevated at 2,664 with lamba specificity, UPEP bence jones protein positive. - Bone marrow biopsy  done 11/16/23 showed approximately 20% plasma cells. When there is no other explanation for renal dysfunction, it would be one of the criteria to diagnose someone with multiple myeloma.  -- Patient with long admission due to multiple co-morbidities that has delayed further oncologic evaluation.   -- Recommend dexamethasone  20 mg po or IV once/week, last given 9/3. -- Pending multiple myeloma panel drawn today 9/4, will follow results - Dr. Pasam/medical oncology following and will make further recommendations.  LV Thrombus --Per imaging done 8/29, new thrombus within ventricular apex, multiple splenic infarcts.  -- Currently on IV heparin , continue per protocol   History of prostate cancer, [id number]  - Diagnosed 2022 - Status post brachytherapy in 2022 - Continue follow-up with urology per previous instructions  Anemia, normocytic - Hemoglobin 10.5 today.  Baseline appears to be in the 9-10 range - Transfuse PRBC for Hgb <7.0.  -- No transfusional intervention required at this time. 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  Wound healed well with no dressing noted.  -- Continue care per Medicine     Code Status Full   Subjective:  Patient seen awake and alert laying in bed, on his phone.  CBI ongoing with frank bloody fluid. IV heparin  infusing well.  Denies acute pain, chest pain, nausea, vomiting or other GI symptoms.  No acute complaints offered.   Objective:   Intake/Output Summary (Last 24 hours) at 01/04/2024 1324 Last data filed at 01/04/2024 1314 Gross per 24 hour  Intake 16038.98 ml  Output 51679 ml  Net -32281.02 ml     PHYSICAL EXAMINATION: ECOG PERFORMANCE STATUS: 3 - Symptomatic, >50% confined to bed  Vitals:   01/04/24 1100 01/04/24 1200  BP: (!) 99/56 105/65  Pulse: 90 94  Resp: 12 14  Temp:    SpO2: 98% 100%   Filed Weights   01/01/24 0600 01/02/24 0349 01/03/24 0600  Weight: 94 lb 2.2 oz (42.7 kg) 130 lb 1.1 oz (59 kg) 128 lb 1.4 oz (58.1 kg)    GENERAL: alert, no distress and comfortable +ill-appearing +CBI ongoing SKIN: skin color, texture, turgor are normal, no rashes or significant lesions EYES: normal, conjunctiva are pink and non-injected, sclera clear OROPHARYNX: no exudate, no erythema and lips, buccal mucosa, and tongue normal  NECK: supple, thyroid normal size, non-tender, without nodularity LYMPH: no palpable lymphadenopathy in the cervical, axillary or inguinal LUNGS:  clear to auscultation and percussion with normal breathing effort HEART: regular rate & rhythm and no murmurs and no lower extremity edema ABDOMEN: abdomen soft, non-tender and normal bowel sounds MUSCULOSKELETAL:+R BKA PSYCH: alert & oriented x 3 with fluent speech NEURO: no  focal motor/sensory deficits   All questions were answered. The patient knows to call the clinic with any problems, questions or concerns.   The total time spent in the appointment was 40 minutes encounter with patient including review of chart and various tests results, discussions about plan of care and coordination of care plan  Olam JINNY Brunner, NP 01/04/2024 1:24 PM    Labs Reviewed:  Lab Results  Component Value Date   WBC 9.6 01/04/2024   HGB 10.5 (L) 01/04/2024   HCT 31.9 (L) 01/04/2024   MCV 86.4 01/04/2024   PLT 222 01/04/2024   Recent Labs    11/05/23 0350 11/05/23 0352 11/20/23 0541 11/20/23 1813 12/29/23 0422 12/29/23 0926 12/29/23 1200 01/02/24 1509 01/03/24 0500 01/04/24 0224  NA  --    < > 131*   < > 121*  --    < > 127* 125* 129*  K  --    < > 5.6*   < > 5.1  --    < > 4.2 4.5 4.5  CL  --    < > 101   < > 84*  --    < > 93* 93* 93*  CO2  --    < > 20*   < > 20*  --    < > 19* 17* 18*  GLUCOSE  --    < > 184*   < > 183*  --    < > 107* 81 197*  BUN  --    < > 59*   < > 126*  --    < > 109* 106* 114*  CREATININE  --    < > 2.30*   < > 3.96*  --    < > 5.05* 5.60* 6.03*  CALCIUM   --    < > 8.6*   < > 8.8*  --    < > 8.0* 8.1* 8.1*  GFRNONAA  --    < > 31*   < > 16*  --    < > 12* 11* 10*  PROT 7.1   < > 7.2  --   --  8.3*  --  7.4  --   --   ALBUMIN 1.8*   < > 2.1*  2.1*   < > 2.3* 2.3*  --  1.8*  --   --   AST 28   < > 18  --   --  28  --  178*  --   --   ALT 28   < > 18  --   --  23  --  372*  --   --   ALKPHOS 49   < > 40  --   --  51  --  43  --   --   BILITOT 0.6   < > 0.8  --   --  0.8  --  0.6  --   --   BILIDIR 0.2  --  <0.1  --   --  0.1  --   --   --   --   IBILI 0.4  --  NOT CALCULATED  --   --  0.7  --   --   --   --    < > =  values in this interval not displayed.    Studies Reviewed:  US  RENAL Result Date: 01/03/2024 CLINICAL DATA:  Hydronephrosis. EXAM: RENAL / URINARY TRACT ULTRASOUND COMPLETE COMPARISON:  Ultrasound dated 10/20/2023 and  CT abdomen pelvis dated 12/29/2023. FINDINGS: Right Kidney: Renal measurements: 11.6 x 5.7 x 6.7 cm = volume: 230 mL. Diffuse increased renal parenchymal echogenicity. There is moderate right hydronephrosis. No shadowing stone. There is dilatation of the visualized proximal right ureter. Left Kidney: Renal measurements: 11.7 x 5.1 x 5.5 cm = volume: 170 mL. Increased renal parenchymal echogenicity. Moderate left hydronephrosis. No shadowing stone. Faint echogenic content within the renal collecting system likely proteinaceous debris. Bladder: The urinary bladder is decompressed around a Foley catheter. There is diffuse sequential thickening of the bladder wall concerning for cystitis. Echogenic debris surrounds the Foley catheter within the bladder lumen may represent proteinaceous content or blood clot. Other: None. IMPRESSION: 1. Moderate bilateral hydronephrosis.  No shadowing stone. 2. Debris within the left renal collecting system likely proteinaceous content and suggestive of pyelonephritis. Correlation with urinalysis recommended. 3. Thickened bladder wall. Complex debris within the bladder likely proteinaceous or hemorrhagic content. Clinical correlation is recommended. Electronically Signed   By: Vanetta Chou M.D.   On: 01/03/2024 19:52   DG Chest Port 1 View Result Date: 01/02/2024 CLINICAL DATA:  [id number] Sepsis [facility]) [id number] EXAM: PORTABLE CHEST 1 VIEW COMPARISON:  December 30, 2023 FINDINGS: The cardiomediastinal silhouette is unchanged in contour.RIGHT neck CVC with tip terminating over the expected area of the superior cavoatrial junction. Atherosclerotic calcifications. No pleural effusion. No pneumothorax. No acute pleuroparenchymal abnormality. IMPRESSION: No acute cardiopulmonary abnormality. Electronically Signed   By: Corean Salter M.D.   On: 01/02/2024 17:09   DG Chest Port 1 View Result Date: 12/30/2023 EXAM: 1 VIEW(S) XRAY OF THE CHEST 12/30/2023 06:13:00 AM COMPARISON: 12/29/2023  CLINICAL HISTORY: Dyspnea; Hx of: myocardial infarction and hypertension FINDINGS: LUNGS AND PLEURA: Increased interstitial pulmonary edema. No pleural effusion. No pneumothorax. HEART AND MEDIASTINUM: Right IJ CVC in place with tip terminating over the SVC. No acute abnormality of the cardiac and mediastinal silhouettes. BONES AND SOFT TISSUES: Aortic arch calcifications. No acute osseous abnormality. IMPRESSION: 1. Increased interstitial pulmonary edema. 2. Right IJ CVC in place with tip terminating over the SVC. Electronically signed by: Norman Gatlin MD 12/30/2023 09:59 AM EDT RP Workstation: [id number]   CT ABDOMEN PELVIS W CONTRAST Result Date: 12/29/2023 CLINICAL DATA:  Acute nonlocalized abdominal pain, hematuria EXAM: CT ABDOMEN AND PELVIS WITH CONTRAST TECHNIQUE: Multidetector CT imaging of the abdomen and pelvis was performed using the standard protocol following bolus administration of intravenous contrast. RADIATION DOSE REDUCTION: This exam was performed according to the departmental dose-optimization program which includes automated exposure control, adjustment of the mA and/or kV according to patient size and/or use of iterative reconstruction technique. CONTRAST:  75mL OMNIPAQUE  IOHEXOL  350 MG/ML SOLN COMPARISON:  11/03/2023 FINDINGS: Lower chest: Interval development of mild interstitial pulmonary edema within the visualized lung bases. Cardiac size is mildly enlarged with left ventricular dilation and mural thickening involving the apex suggesting prior myocardial infarction. A 18 mm rounded polypoid mass is seen within the dilated ventricular apex likely representing a polypoid thrombus, new since MRI examination of 10/13/2023 Hepatobiliary: Moderate peribiliary/periportal edema. Marked thickening of the gallbladder wall likely reflects a component of periportal edema. No enhancing intrahepatic mass. No intra or extrahepatic biliary ductal dilation. Pancreas: Unremarkable Spleen: Multiple  wedge like filling defects within the spleen in keeping with multiple splenic infarcts. Scattered benign calcified  granuloma noted throughout the spleen. Adrenals/Urinary Tract: The adrenal glands are unremarkable. The kidneys are normal in size and position. Mild perinephric inflammatory stranding. Wedge like areas of cortical hypoenhancement within the interpolar region of the left kidney. Moderate bilateral hydronephrosis and hydroureter. Marked urothelial enhancement bilaterally in keeping with urothelial inflammation. Foley catheter balloon is seen within a decompressed bladder lumen. There is, however, heterogeneously hyperdense material within the decompressed bladder lumen likely representing hemorrhagic or proteinaceous debris with admixed punctate foci of gas suggesting a sub solid consistency. There is superimposed marked circumferential bladder wall thickening and perivesicular inflammatory stranding suggesting a superimposed cystitis. Together, the findings suggest changes of an infectious cystitis with bilateral ascending urinary tract infection and at least left pyelonephritis. Stomach/Bowel: Stomach, small bowel, and large bowel unremarkable. Appendix normal. Mild ascites. No free intraperitoneal gas. Vascular/Lymphatic: Moderate aortoiliac atherosclerotic calcification. 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 No pathologic adenopathy within the abdomen and pelvis. Reproductive: Brachytherapy seeds are seen within the prostate gland Other: No abdominal wall hernia Musculoskeletal: No acute bone abnormality. No lytic or blastic bone lesion. Osseous structures are age appropriate. IMPRESSION: 1. Interval development of mild interstitial pulmonary edema within the visualized lung bases. 2. Mild cardiomegaly with left ventricular dilation and mural thickening involving the apex suggesting prior myocardial infarction. 3. 18 mm rounded polypoid mass within the  dilated ventricular apex likely representing a polypoid thrombus, new since MRI examination of 10/13/2023. 4. Multiple splenic infarcts. 5. Moderate bilateral hydronephrosis and hydroureter with marked urothelial enhancement bilaterally in keeping with urothelial inflammation. Wedge like areas of cortical hypoenhancement within the interpolar region of the left kidney. Together, the findings suggest changes of an infectious cystitis with bilateral ascending urinary tract infection and at least left pyelonephritis. 6. Moderate peribiliary/periportal edema. Marked thickening of the gallbladder wall likely reflects a component of periportal edema. 7. Mild ascites. 8. 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 9. Aortic atherosclerosis. These results will be called to the ordering clinician or representative by the Radiologist Assistant, and communication documented in the PACS or [employer]. Aortic Atherosclerosis (ICD10-I70.0). Electronically Signed   By: Dorethia Molt M.D.   On: 12/29/2023 21:23   EEG adult Result Date: 12/29/2023 Shelton Arlin KIDD, MD     12/29/2023  4:44 PM Patient Name: Derek Thompson MRN: [id number] Epilepsy Attending: Arlin KIDD Shelton Referring Physician/Provider: Jenna Maude BRAVO, NP Date: 12/29/2023 Duration: 22.26 mins Patient history: 63yo M with ams. EEG to evaluate for seizure Level of alertness: awake/lethargic AEDs during EEG study: None Technical aspects: This EEG study was done with scalp electrodes positioned according to the 10-20 International system of electrode placement. Electrical activity was reviewed with band pass filter of 1-70Hz , sensitivity of 7 uV/mm, display speed of 6mm/sec with a 60Hz  notched filter applied as appropriate. EEG data were recorded continuously and digitally stored.  Video monitoring was available and reviewed as appropriate. Description: EEG showed continuous generalized 3 to 6 Hz theta-delta slowing.  Hyperventilation and photic stimulation were not performed.   ABNORMALITY - Continuous slow, generalized IMPRESSION: This study is suggestive of moderate to severe diffuse encephalopathy. No seizures or epileptiform discharges were seen throughout the recording. Arlin KIDD Shelton   DG Chest Port 1 View Result Date: 12/29/2023 CLINICAL DATA:  Central line placement. EXAM: PORTABLE CHEST 1 VIEW COMPARISON:  Radiograph of same day. FINDINGS: Interval placement of right internal jugular catheter is noted with distal tip in expected position  of the SVC. No pneumothorax. IMPRESSION: Interval placement of right internal jugular catheter with distal tip in expected position of the SVC. Electronically Signed   By: Lynwood Landy Raddle M.D.   On: 12/29/2023 13:26   ECHOCARDIOGRAM COMPLETE Result Date: 12/29/2023    ECHOCARDIOGRAM REPORT   Patient Name:   Derek Thompson Date of Exam: 12/29/2023 Medical Rec #:  [id number]           Height:       72.0 in Accession #:    [id number]          Weight:       138.2 lb Date of Birth:  1960-11-21           BSA:          1.821 m Patient Age:    63 years            BP:           87/63 mmHg Patient Gender: M                   HR:           107 bpm. Exam Location:  Inpatient Procedure: 2D Echo (Both Spectral and Color Flow Doppler were utilized during            procedure). STAT ECHO Indications:    shock  History:        Patient has prior history of Echocardiogram examinations, most                 recent 10/12/2023. History of LV thrombus, CAD; Risk                 Factors:Diabetes and Hypertension.  Sonographer:    Tinnie Barefoot RDCS Referring Phys: [id number] PHOEBE CHUN IMPRESSIONS  1. Heavy smoke noted in the LV apex which is suggestive of soft thrombus and very low flow (image 52). Paired LV mid-ventricular false tendons (normal variant). Left ventricular ejection fraction, by estimation, is <20%. Left ventricular ejection fraction by PLAX is 13 %. The left ventricle has severely  decreased function. The left ventricle demonstrates global hypokinesis. Left ventricular diastolic parameters are indeterminate.  2. Right ventricular systolic function is moderately reduced. The right ventricular size is normal. There is normal pulmonary artery systolic pressure. The estimated right ventricular systolic pressure is 29.0 mmHg.  3. Left atrial size was moderately dilated.  4. The mitral valve is grossly normal. Mild mitral valve regurgitation.  5. The aortic valve is tricuspid. Aortic valve regurgitation is not visualized. No aortic stenosis is present. Comparison(s): Changes from prior study are noted. 10/12/2023: LVEF 20-25%, no LV apical thrombus. Conclusion(s)/Recommendation(s): Critical findings reported to Dr. Myrna and acknowledged at 12/29/2023 at 11:53 am. FINDINGS  Left Ventricle: Heavy smoke noted in the LV apex which is suggestive of soft thrombus and very low flow (image 52). Paired LV mid-ventricular false tendons (normal variant). Left ventricular ejection fraction, by estimation, is <20%. Left ventricular ejection fraction by PLAX is 13 %. The left ventricle has severely decreased function. The left ventricle demonstrates global hypokinesis. The left ventricular internal cavity size was normal in size. There is no left ventricular hypertrophy. Left ventricular diastolic parameters are indeterminate. Right Ventricle: The right ventricular size is normal. No increase in right ventricular wall thickness. Right ventricular systolic function is moderately reduced. There is normal pulmonary artery systolic pressure. The tricuspid regurgitant velocity is 2.29 m/s, and with an assumed right atrial pressure of 8  mmHg, the estimated right ventricular systolic pressure is 29.0 mmHg. Left Atrium: Left atrial size was moderately dilated. Right Atrium: Right atrial size was normal in size. Pericardium: There is no evidence of pericardial effusion. Mitral Valve: The mitral valve is grossly normal. Mild  mitral valve regurgitation. Tricuspid Valve: The tricuspid valve is grossly normal. Tricuspid valve regurgitation is mild. Aortic Valve: The aortic valve is tricuspid. Aortic valve regurgitation is not visualized. No aortic stenosis is present. Pulmonic Valve: The pulmonic valve was normal in structure. Pulmonic valve regurgitation is not visualized. Aorta: The aortic root and ascending aorta are structurally normal, with no evidence of dilitation. IAS/Shunts: No atrial level shunt detected by color flow Doppler.  LEFT VENTRICLE PLAX 2D LV EF:         Left            Diastology                ventricular     LV e' medial:  3.15 cm/s                ejection        LV e' lateral: 3.37 cm/s                fraction by                PLAX is 13                %. LVIDd:         5.30 cm LVIDs:         5.00 cm LV PW:         0.90 cm LV IVS:        0.90 cm LVOT diam:     2.10 cm LV SV:         29 LV SV Index:   16 LVOT Area:     3.46 cm  RIGHT VENTRICLE            IVC RV Basal diam:  2.70 cm    IVC diam: 2.30 cm RV S prime:     7.18 cm/s TAPSE (M-mode): 1.3 cm LEFT ATRIUM             Index LA diam:        3.80 cm 2.09 cm/m LA Vol (A2C):   97.2 ml 53.38 ml/m LA Vol (A4C):   59.3 ml 32.56 ml/m LA Biplane Vol: 76.6 ml 42.06 ml/m  AORTIC VALVE LVOT Vmax:   56.80 cm/s LVOT Vmean:  37.200 cm/s LVOT VTI:    0.084 m  AORTA Ao Root diam: 3.10 cm MR Peak grad: 53.6 mmHg   TRICUSPID VALVE MR Mean grad: 35.0 mmHg   TR Peak grad:   21.0 mmHg MR Vmax:      366.00 cm/s TR Vmax:        229.00 cm/s MR Vmean:     286.0 cm/s                           SHUNTS                           Systemic VTI:  0.08 m                           Systemic Diam: 2.10 cm Vinie Maxcy MD Electronically signed by Vinie Maxcy MD  Signature Date/Time: 12/29/2023/12:00:56 PM    Final    DG CHEST PORT 1 VIEW Result Date: 12/29/2023 CLINICAL DATA:  Fever. EXAM: PORTABLE CHEST 1 VIEW COMPARISON:  11/05/2023 FINDINGS: Stable enlarged cardiac silhouette. Tortuous  and partially calcified thoracic aorta. Clear lungs with normal vascularity. Mild diffuse chronic interstitial prominence with resolved superimposed interstitial pulmonary edema. Unremarkable bones. IMPRESSION: 1. No acute abnormality. 2. Stable cardiomegaly. 3. Mild chronic interstitial lung disease. Electronically Signed   By: Elspeth Bathe M.D.   On: 12/29/2023 09:59

## 2024-01-04 NOTE — Progress Notes (Signed)
 Initial Nutrition Assessment  DOCUMENTATION CODES:   Severe malnutrition in context of chronic illness, Underweight  INTERVENTION:   Recommend Liberalizing diet to REGULAR, discussed with PCCM who agrees with diet liberalization Pt is a Pescetarian. Pt eats fish, eggs, dairy but no red meat, no poultry  Ensure Plus High Protein po TID, each supplement provides 350 kcal and 20 grams of protein. Encouraged opt to try and drink more than 1 Ensure each day  Add Thiamine  100 mg daily x 7 days Add Renal MVI daily  While on decadron , recommend considering addition of long acting/basal insulin   NUTRITION DIAGNOSIS:   Severe Malnutrition related to chronic illness, social / environmental circumstances as evidenced by moderate fat depletion, severe muscle depletion, percent weight loss, energy intake < or equal to 75% for > or equal to 1 month.  GOAL:   Patient will meet greater than or equal to 90% of their needs  MONITOR:   PO intake, Supplement acceptance, Labs, Weight trends, I & O's  REASON FOR ASSESSMENT:   Consult, LOS Assessment of nutrition requirement/status  ASSESSMENT:    63 yo male admitted with initial concern of lethargy and admitted with AKI with hyperkalemia, hyponatremia and acidosis; in addition pt with chronic osteomyelitis of right foot with gangrene on admission requiring R. BKA. 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 Pt was stable for discharge (but not discharged due to social situation, placement issues) but developed hypotension, delirium, worsening hematuria requiring admission to ICU. [facility] admission [facility] 11/03/23. PMH includes LV thrombus, HFrEF, urinary retention with hx of prostate cancer-radiation, chronic osteomyelitis of right foot, DM, CKD4. Pt denies EtOH or drug use.   7/04 Admitted 7/17 Bone Marrow Biopsy (newly  dagnosed multiple myeloma) 7/18 R BKA 2/2 chronic osteomyelitis 8/29 Advanced HF consulted. LV thrombus, multiple splenic infarcts, IV Heparin , CT A/P: bilateral hydronephrosis and hydroureter with concern for infectious cystitis, bilateral ascending UTI 9/02 Urology consulted for gross hematuria with clots, continuous bladder irrigation imitated 9/03 Nephrology consulted  LOS 63 days. Pt alert on visit today, speaks softly and slowly. Difficulty getting very good diet and weight history prior to admission given long length of stay. Pt believes he was eating well prior to admission and indicates wt loss but he believes this has been since admission.   +sepsis with fungemia and multifocal pneumonia  Pt awake, lunch tray in front of him on visit today. Pt ate sugar free jello and a few bites of the egg salad (just the egg) off of his sandwich. Pt drinking water . Pt reports appetite is fair.   Discussed sources of protein with pt, pt eats eggs, likes tuna fish, likes dried beans. Pt also drinks milk, other dairy. RD assisted pt with request for pinto beans with rice and gravy tonight for dinner, pt pleased with this. Pt also agreeable to Ensure  Pt reports he does not eat meat, even the smell of it makes him sick to his stomach. RD further clarified that pt does not eat red meat (beef or pork) and does not eat poultry (chicken or [place]) but does eat fish, eggs and dairy.   Weights are all over the place but overall pt with wt loss trend since admission. Admission wt 69.2 kg Current wt 58.1 kg.  Noted lost wt this admission, lowest wt 40.1 kg. Attributing some weight discrepancies  Previous wt encounters from  10/2022 to 10/2023 ranging from 70.5 kg tot 81.6 kg with most recent wt in June of 71.2 kg. Likely supporting true weight loss trend although some weights may be skewed from fluid status with hx of advanced HF  AKI with monoclonial gammopathy,  BOO, shock with likely CKD 4 at baseline; pt  currently with continuous bladder irrigation, output light red in color. Difficult to assess actual UOP secondary to this. Noted pt currently would pursue dialysis if offered; currently not indicated per Nephrology but pt also not good long term iHD candidate  Baseline Cr though to be around mid 2-3 range, current Creatinine 6.03  CBGs poorly controlled on decadron ; recommend considering addition of long acting/basal insulin   Labs: Sodium 129 (L) BUN 114 (H) Creatinine 6.03 (H) Potassium 4.5 (wdl)  Meds:  Decadron  IV heparin  SS novolog  Micafungin   NUTRITION - FOCUSED PHYSICAL EXAM:  Flowsheet Row Most Recent Value  Orbital Region Moderate depletion  Upper Arm Region Mild depletion  Thoracic and Lumbar Region Moderate depletion  Buccal Region Moderate depletion  Temple Region Moderate depletion  Clavicle Bone Region Severe depletion  Clavicle and Acromion Bone Region Severe depletion  Scapular Bone Region Severe depletion  Dorsal Hand Moderate depletion  Patellar Region Severe depletion  Anterior Thigh Region Severe depletion  Posterior Calf Region Severe depletion  Edema (RD Assessment) Mild    Diet Order:  HEART HEALTHY/CARB MODIFIED with 1200 mL fluid restriction  EDUCATION NEEDS:   Education needs have been addressed  Skin:  Skin Assessment: Skin Integrity Issues: Skin Integrity Issues:: Incisions Incisions: R BKA on 11/17/23  Last BM:  9/3  Height:   Ht Readings from Last 1 Encounters:  12/30/23 6' (1.829 m)    Weight:   Wt Readings from Last 1 Encounters:  01/05/24 59.8 kg    BMI:  Body mass index is 17.88 kg/m.  Estimated Nutritional Needs:   Kcal:  2000-2200 kcals  Protein:  100-120 g  Fluid:  1.8 L   Betsey Finger MS, RDN, LDN, CNSC Registered Dietitian 3 Clinical Nutrition RD Inpatient Contact Info in Amion

## 2024-01-04 NOTE — Progress Notes (Signed)
 Called for poorly draining catheter.  Assessed patient at bedside. In no distress or discomfort. Able to irrigate 60cc and aspirate without difficulty. However, CBI not flowing well. Removed foley catheter and small clot adherent to the tip. Replaced 3 way 24 Fr foley and easily able to irrigate. Irrigant running smoothly now and clear.  Discussed with patient. Likely has some clot within bladder. Discussed risks and benefits of cystoscopy with clot evacuation. Recent EF <15 and poor surgical candidate. On heparin  for LV thrombus. May ultimately require cysto clot evacuation however in setting of radiation cystitis and continued need for heparin , high risk for recurrent bleeding.  Discussed with RN to manually irrigate foley every four hours and as needed. Continue CBI. Wean as able.  Ole to check on patient in AM.  Matt R. Chanci Ojala MD [facility]  Pager: [phone]

## 2024-01-04 NOTE — Consult Note (Signed)
 [facility]    Date of Admission:  11/02/2023     Total days of antibiotics 7  Micafungin  9/3  Ceftriaxone  9/4  Meropenem  9/3 > 9/4  Zosyn  8/29 >> 9/3 linezolid  8/29 >> 8/31       Reason for Consult: Candidemia    Referring Provider: Angelyn  Primary Care Provider: Elicia Sharper, DO   Assessment: Derek Thompson is a 63 y.o. male admitted with:   Candidemia, Candida Glabratta -  CV Line in place -  LV Thrombus -  BCx (+) 9/02 in 1 of 4 bottles.  Yeast in urine, however large burden of blood in urine as well. He has a chronic LV thrombus - would have some degree of concern this is now infected, will see if we have any re-growth on repeat cultures. We asked PCCM team to consider removing CV line if they can afford that.  - continue micafungin  - remove CVC - If survives multiple challenging complex medical problems he may need a longer course to cover for consideration his LV thrombus is now infected.  - FU repeat blood cultures & susceptibles  - Non-urgent eye exam in time - no need to have opthal come here now  Urinary Tract Infection -  Prostate Cancer, Grade 5, S/P Radiation and ADT 2022 -  Hematuria / radiation cystitis -  Finish out 2 weeks of abx with Ceftriaxone , EOT   Multiple Myeloma -  New diagnosis - oncology started dexamethasone  20 mg PO/IV weekly (last 9/3).   AKI -  Nephrology following. With LVEF 13% dialysis was not recommended. He is considering options while CRRT is not immediately  indicated at the moment.  Lab Results  Component Value Date   CREATININE 6.03 (H) 01/04/2024   CREATININE 5.60 (H) 01/03/2024   CREATININE 5.05 (H) 01/02/2024   Transaminitis -  Lab Results  Component Value Date   ALT 372 (H) 01/02/2024   AST 178 (H) 01/02/2024   ALKPHOS 43 01/02/2024   BILITOT 0.6 01/02/2024     Plan: - continue micafungin  - remove CVC - If survives multiple challenging complex medical problems he may  need a longer course to cover for consideration his LV thrombus is now infected.  - FU repeat blood cultures & susceptibles  - Non-urgent eye exam in time - no need to have opthal come here now     Principal Problem:   Osteomyelitis of right foot (HCC) Active Problems:   Chronic systolic heart failure (HCC)   CAD (coronary artery disease)   Hyperkalemia   AKI (acute kidney injury) (HCC)   Anemia   Acute thrombus of left ventricle (HCC)   Gangrene of right foot (HCC)   Hyponatremia   HFrEF (heart failure with reduced ejection fraction) (HCC)   Chronic osteomyelitis involving lower leg, right (HCC)   Frailty   Urinary retention   Generalized weakness   Sinus tachycardia   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 (HCC)   Chronic anemia   Hypoalbuminemia   Monoclonal gammopathy   Hx of right BKA (HCC)   Moderate protein-calorie malnutrition (HCC)    acetaminophen   1,000 mg Oral Q6H   atorvastatin   80 mg Oral Daily   Chlorhexidine  Gluconate Cloth  6 each Topical Daily   dexamethasone  (DECADRON ) injection  20 mg Intravenous Weekly   ezetimibe   10 mg Oral Daily   feeding supplement  237 mL Oral TID BM  Gerhardt's butt cream   Topical BID   insulin  aspart  0-6 Units Subcutaneous Q4H   mupirocin  ointment   Nasal BID   thiamine   100 mg Oral Daily    HPI: Derek Thompson is a 63 y.o. male admitted to [facility] hospital for 2 months now with Candidemia.   Admitted back on 7/03 for concerns over Rt necrotic foot wound and lethargy from homeless shelter.  Seen by Dr. Harden on 7/17 and went with transtibial amputation 7/18.  Oncology has been involved during this stay for abnormal SPEP and M spike 1.1 - BM Bx revealed new multiple myeloma diagnosis.  Urology involved as well with catheter management for radiation cystitis - coude placed 8/8. Currently undergoing CBT d/t hematuria. H/O prostate Ca grade 5. 8/26 started feeling badly - 8/29 he was hyponatremic 121,  tachycardic and SBP < 100. Became more confused later that day and febrile. Moved to ICU and started on bsABX. STAT echo shoed depressed EF < 20% with large soft thrombus LV Apex AKI with SCr peak 5.03.   Very anxious at the time of visit having PIV placed under ultrasound guidance.  No acute vision changes   Review of Systems: Review of Systems  Constitutional:  Positive for chills and fever.    Past Medical History:  Diagnosis Date   Anemia    Diabetes mellitus without complication (HCC) 1987   Family history of breast cancer    Hypertension    Myocardial infarction (HCC)    Prostate cancer (HCC)     Social History   Tobacco Use   Smoking status: Former    Current packs/day: 0.00    Average packs/day: 1 pack/day for 43.0 years (43.0 ttl pk-yrs)    Types: Cigarettes    Start date: 05/22/1974    Quit date: 05/22/2017    Years since quitting: 6.6   Smokeless tobacco: Never   Tobacco comments:    Quit 2019 about 1PPD  Vaping Use   Vaping status: Never Used  Substance Use Topics   Alcohol use: Not Currently    Comment: last drink 2015   Drug use: Not Currently    Comment: none in 7 years    Family History  Problem Relation Age of Onset   Breast cancer Mother        dx 30s/40s, twice   Cancer Maternal Grandfather        unknown type, d. >50   Allergies  Allergen Reactions   Cefepime  Other (See Comments)    Cefepime  neurotoxicity 11/07/23    OBJECTIVE: Blood pressure 107/72, pulse 94, temperature 97.6 F (36.4 C), temperature source Axillary, resp. rate 12, height 6' (1.829 m), weight 58.1 kg, SpO2 100%.  Physical Exam Constitutional:      Appearance: He is ill-appearing.  Cardiovascular:     Rate and Rhythm: Regular rhythm. Tachycardia present.  Pulmonary:     Effort: Pulmonary effort is normal.     Breath sounds: Normal breath sounds.  Abdominal:     General: There is no distension.     Palpations: Abdomen is soft.  Skin:    General: Skin is warm and  dry.  Neurological:     Mental Status: He is alert and oriented to person, place, and time.     Lab Results Lab Results  Component Value Date   WBC 9.6 01/04/2024   HGB 10.5 (L) 01/04/2024   HCT 31.9 (L) 01/04/2024   MCV 86.4 01/04/2024   PLT 222 01/04/2024  Lab Results  Component Value Date   CREATININE 6.03 (H) 01/04/2024   BUN 114 (H) 01/04/2024   NA 129 (L) 01/04/2024   K 4.5 01/04/2024   CL 93 (L) 01/04/2024   CO2 18 (L) 01/04/2024    Lab Results  Component Value Date   ALT 372 (H) 01/02/2024   AST 178 (H) 01/02/2024   ALKPHOS 43 01/02/2024   BILITOT 0.6 01/02/2024     Microbiology: Recent Results (from the past 240 hours)  Culture, Urine (Do not remove urinary catheter, catheter placed by urology or difficult to place)     Status: None   Collection Time: 12/27/23 10:05 AM   Specimen: Urine, Catheterized  Result Value Ref Range Status   Specimen Description URINE, CATHETERIZED  Final   Special Requests NONE  Final   Culture   Final    NO GROWTH Performed at [facility], [address] [place], [name] [address]    Report Status 12/28/2023 FINAL  Final  Remove and replace urinary cath (placed > 5 days) then obtain urine culture from new indwelling urinary catheter.     Status: None   Collection Time: 12/29/23  8:17 AM   Specimen: Urine, Catheterized  Result Value Ref Range Status   Specimen Description URINE, CATHETERIZED  Final   Special Requests NONE  Final   Culture   Final    NO GROWTH Performed at [facility], [address], [place], [name] [address]    Report Status 12/30/2023 FINAL  Final  Respiratory (~20 pathogens) panel by PCR     Status: None   Collection Time: 12/29/23  9:21 AM   Specimen: Nasopharyngeal Swab; Respiratory  Result Value Ref Range Status   Adenovirus NOT DETECTED NOT DETECTED Final   Coronavirus 229E NOT DETECTED NOT DETECTED Final    Comment: (NOTE) The Coronavirus on the Respiratory Panel, DOES NOT test  for the novel  Coronavirus (2019 nCoV)    Coronavirus HKU1 NOT DETECTED NOT DETECTED Final   Coronavirus NL63 NOT DETECTED NOT DETECTED Final   Coronavirus OC43 NOT DETECTED NOT DETECTED Final   Metapneumovirus NOT DETECTED NOT DETECTED Final   Rhinovirus / Enterovirus NOT DETECTED NOT DETECTED Final   Influenza A NOT DETECTED NOT DETECTED Final   Influenza B NOT DETECTED NOT DETECTED Final   Parainfluenza Virus 1 NOT DETECTED NOT DETECTED Final   Parainfluenza Virus 2 NOT DETECTED NOT DETECTED Final   Parainfluenza Virus 3 NOT DETECTED NOT DETECTED Final   Parainfluenza Virus 4 NOT DETECTED NOT DETECTED Final   Respiratory Syncytial Virus NOT DETECTED NOT DETECTED Final   Bordetella pertussis NOT DETECTED NOT DETECTED Final   Bordetella Parapertussis NOT DETECTED NOT DETECTED Final   Chlamydophila pneumoniae NOT DETECTED NOT DETECTED Final   Mycoplasma pneumoniae NOT DETECTED NOT DETECTED Final    Comment: Performed at [facility], [address] [place], [name] [address]  Resp panel by RT-PCR (RSV, Flu A&B, Covid) Anterior Nasal Swab     Status: None   Collection Time: 12/29/23  9:21 AM   Specimen: Anterior Nasal Swab  Result Value Ref Range Status   SARS Coronavirus 2 by RT PCR NEGATIVE NEGATIVE Final   Influenza A by PCR NEGATIVE NEGATIVE Final   Influenza B by PCR NEGATIVE NEGATIVE Final    Comment: (NOTE) The Xpert Xpress SARS-CoV-2/FLU/RSV plus assay is intended as an aid in the diagnosis of influenza from Nasopharyngeal swab specimens and should not be used  as a sole basis for treatment. Nasal washings and aspirates are unacceptable for Xpert Xpress SARS-CoV-2/FLU/RSV testing.  Fact Sheet for Patients: BloggerCourse.com  Fact Sheet for Healthcare Providers: SeriousBroker.it  This test is not yet approved or cleared by the [place]  FDA and has been authorized for detection and/or diagnosis of SARS-CoV-2  by FDA under an Emergency Use Authorization (EUA). This EUA will remain in effect (meaning this test can be used) for the duration of the COVID-19 declaration under Section 564(b)(1) of the Act, 21 U.S.C. section 360bbb-3(b)(1), unless the authorization is terminated or revoked.     Resp Syncytial Virus by PCR NEGATIVE NEGATIVE Final    Comment: (NOTE) Fact Sheet for Patients: BloggerCourse.com  Fact Sheet for Healthcare Providers: SeriousBroker.it  This test is not yet approved or cleared by the [place]  FDA and has been authorized for detection and/or diagnosis of SARS-CoV-2 by FDA under an Emergency Use Authorization (EUA). This EUA will remain in effect (meaning this test can be used) for the duration of the COVID-19 declaration under Section 564(b)(1) of the Act, 21 U.S.C. section 360bbb-3(b)(1), unless the authorization is terminated or revoked.  Performed at [facility], [address] [place], [name] [address]   Culture, blood (Routine X 2) w Reflex to ID Panel     Status: None   Collection Time: 12/29/23  9:26 AM   Specimen: BLOOD RIGHT HAND  Result Value Ref Range Status   Specimen Description BLOOD RIGHT HAND  Final   Special Requests   Final    BOTTLES DRAWN AEROBIC AND ANAEROBIC Blood Culture adequate volume   Culture   Final    NO GROWTH 5 DAYS Performed at [facility], [address] [place], [name] [address]    Report Status 01/03/2024 FINAL  Final  Culture, blood (Routine X 2) w Reflex to ID Panel     Status: Abnormal   Collection Time: 12/29/23  9:40 AM   Specimen: BLOOD LEFT HAND  Result Value Ref Range Status   Specimen Description BLOOD LEFT HAND  Final   Special Requests   Final    BOTTLES DRAWN AEROBIC AND ANAEROBIC Blood Culture adequate volume   Culture  Setup Time   Final    GRAM POSITIVE COCCI AEROBIC BOTTLE ONLY CRITICAL RESULT CALLED TO, READ BACK BY AND VERIFIED  WITH: PHARMD V BRYK 12/30/2023 @ 2330 BY AB    Culture (A)  Final    STAPHYLOCOCCUS EPIDERMIDIS THE SIGNIFICANCE OF ISOLATING THIS ORGANISM FROM A SINGLE SET OF BLOOD CULTURES WHEN MULTIPLE SETS ARE DRAWN IS UNCERTAIN. PLEASE NOTIFY THE MICROBIOLOGY DEPARTMENT WITHIN ONE WEEK IF SPECIATION AND SENSITIVITIES ARE REQUIRED. Performed at [facility], [address], [place], [name] [address]    Report Status 01/01/2024 FINAL  Final  Blood Culture ID Panel (Reflexed)     Status: Abnormal   Collection Time: 12/29/23  9:40 AM  Result Value Ref Range Status   Enterococcus faecalis NOT DETECTED NOT DETECTED Final   Enterococcus Faecium NOT DETECTED NOT DETECTED Final   Listeria monocytogenes NOT DETECTED NOT DETECTED Final   Staphylococcus species DETECTED (A) NOT DETECTED Final    Comment: CRITICAL RESULT CALLED TO, READ BACK BY AND VERIFIED WITH: PHARMD V BRYK 12/30/2023 @ 2330 BY AB    Staphylococcus aureus (BCID) NOT DETECTED NOT DETECTED Final   Staphylococcus epidermidis DETECTED (A) NOT DETECTED Final    Comment: Methicillin (oxacillin) resistant coagulase negative staphylococcus. Possible blood culture contaminant (unless  isolated from more than one blood culture draw or clinical case suggests pathogenicity). No antibiotic treatment is indicated for blood  culture contaminants. CRITICAL RESULT CALLED TO, READ BACK BY AND VERIFIED WITH: PHARMD V BRYK 12/30/2023 @ 2330 BY AB    Staphylococcus lugdunensis NOT DETECTED NOT DETECTED Final   Streptococcus species NOT DETECTED NOT DETECTED Final   Streptococcus agalactiae NOT DETECTED NOT DETECTED Final   Streptococcus pneumoniae NOT DETECTED NOT DETECTED Final   Streptococcus pyogenes NOT DETECTED NOT DETECTED Final   A.calcoaceticus-baumannii NOT DETECTED NOT DETECTED Final   Bacteroides fragilis NOT DETECTED NOT DETECTED Final   Enterobacterales NOT DETECTED NOT DETECTED Final   Enterobacter cloacae complex NOT DETECTED NOT DETECTED  Final   Escherichia coli NOT DETECTED NOT DETECTED Final   Klebsiella aerogenes NOT DETECTED NOT DETECTED Final   Klebsiella oxytoca NOT DETECTED NOT DETECTED Final   Klebsiella pneumoniae NOT DETECTED NOT DETECTED Final   Proteus species NOT DETECTED NOT DETECTED Final   Salmonella species NOT DETECTED NOT DETECTED Final   Serratia marcescens NOT DETECTED NOT DETECTED Final   Haemophilus influenzae NOT DETECTED NOT DETECTED Final   Neisseria meningitidis NOT DETECTED NOT DETECTED Final   Pseudomonas aeruginosa NOT DETECTED NOT DETECTED Final   Stenotrophomonas maltophilia NOT DETECTED NOT DETECTED Final   Candida albicans NOT DETECTED NOT DETECTED Final   Candida auris NOT DETECTED NOT DETECTED Final   Candida glabrata NOT DETECTED NOT DETECTED Final   Candida krusei NOT DETECTED NOT DETECTED Final   Candida parapsilosis NOT DETECTED NOT DETECTED Final   Candida tropicalis NOT DETECTED NOT DETECTED Final   Cryptococcus neoformans/gattii NOT DETECTED NOT DETECTED Final   Methicillin resistance mecA/C DETECTED (A) NOT DETECTED Final    Comment: CRITICAL RESULT CALLED TO, READ BACK BY AND VERIFIED WITH: PHARMD V BRYK 12/30/2023 @ 2330 BY AB Performed at [facility], [address], [place], [name] [address]   MRSA Next Gen by PCR, Nasal     Status: None   Collection Time: 12/29/23 12:45 PM   Specimen: Nasal Mucosa; Nasal Swab  Result Value Ref Range Status   MRSA by PCR Next Gen NOT DETECTED NOT DETECTED Final    Comment: (NOTE) The GeneXpert MRSA Assay (FDA approved for NASAL specimens only), is one component of a comprehensive MRSA colonization surveillance program. It is not intended to diagnose MRSA infection nor to guide or monitor treatment for MRSA infections. Test performance is not FDA approved in patients less than 57 years old. Performed at [facility], [address], [place], [name] [address]   Culture, blood (Routine X 2) w Reflex to ID Panel      Status: None (Preliminary result)   Collection Time: 12/31/23  2:17 AM   Specimen: BLOOD RIGHT ARM  Result Value Ref Range Status   Specimen Description BLOOD RIGHT ARM  Final   Special Requests   Final    BOTTLES DRAWN AEROBIC AND ANAEROBIC Blood Culture adequate volume   Culture   Final    NO GROWTH 4 DAYS Performed at [facility], [address] [place], [name] [address]    Report Status PENDING  Incomplete  Culture, blood (Routine X 2) w Reflex to ID Panel     Status: None (Preliminary result)   Collection Time: 12/31/23  2:17 AM   Specimen: BLOOD RIGHT HAND  Result Value Ref Range Status   Specimen Description BLOOD RIGHT HAND  Final   Special Requests  Final    BOTTLES DRAWN AEROBIC AND ANAEROBIC Blood Culture adequate volume   Culture   Final    NO GROWTH 4 DAYS Performed at [facility], [address], [place], [name] [address]    Report Status PENDING  Incomplete  Culture, blood (Routine X 2) w Reflex to ID Panel     Status: None (Preliminary result)   Collection Time: 01/02/24  9:34 AM   Specimen: BLOOD  Result Value Ref Range Status   Specimen Description BLOOD SITE NOT SPECIFIED  Final   Special Requests   Final    BOTTLES DRAWN AEROBIC AND ANAEROBIC Blood Culture adequate volume   Culture   Final    NO GROWTH 2 DAYS Performed at [facility], [address] [place], [name] [address]    Report Status PENDING  Incomplete  Culture, blood (Routine X 2) w Reflex to ID Panel     Status: None (Preliminary result)   Collection Time: 01/02/24  9:34 AM   Specimen: BLOOD  Result Value Ref Range Status   Specimen Description BLOOD SITE NOT SPECIFIED  Final   Special Requests   Final    BOTTLES DRAWN AEROBIC AND ANAEROBIC Blood Culture adequate volume   Culture  Setup Time   Final    BUDDING YEAST SEEN ANAEROBIC BOTTLE ONLY CRITICAL RESULT CALLED TO, READ BACK BY AND VERIFIED WITH: PHARMD E.SINCLAIR AT 0831 ON 01/04/2024 BY T.SAAD. Performed at  [facility], [address], [place], [name] [address]    Culture YEAST  Final   Report Status PENDING  Incomplete  Blood Culture ID Panel (Reflexed)     Status: Abnormal   Collection Time: 01/02/24  9:34 AM  Result Value Ref Range Status   Enterococcus faecalis NOT DETECTED NOT DETECTED Final   Enterococcus Faecium NOT DETECTED NOT DETECTED Final   Listeria monocytogenes NOT DETECTED NOT DETECTED Final   Staphylococcus species NOT DETECTED NOT DETECTED Final   Staphylococcus aureus (BCID) NOT DETECTED NOT DETECTED Final   Staphylococcus epidermidis NOT DETECTED NOT DETECTED Final   Staphylococcus lugdunensis NOT DETECTED NOT DETECTED Final   Streptococcus species NOT DETECTED NOT DETECTED Final   Streptococcus agalactiae NOT DETECTED NOT DETECTED Final   Streptococcus pneumoniae NOT DETECTED NOT DETECTED Final   Streptococcus pyogenes NOT DETECTED NOT DETECTED Final   A.calcoaceticus-baumannii NOT DETECTED NOT DETECTED Final   Bacteroides fragilis NOT DETECTED NOT DETECTED Final   Enterobacterales NOT DETECTED NOT DETECTED Final   Enterobacter cloacae complex NOT DETECTED NOT DETECTED Final   Escherichia coli NOT DETECTED NOT DETECTED Final   Klebsiella aerogenes NOT DETECTED NOT DETECTED Final   Klebsiella oxytoca NOT DETECTED NOT DETECTED Final   Klebsiella pneumoniae NOT DETECTED NOT DETECTED Final   Proteus species NOT DETECTED NOT DETECTED Final   Salmonella species NOT DETECTED NOT DETECTED Final   Serratia marcescens NOT DETECTED NOT DETECTED Final   Haemophilus influenzae NOT DETECTED NOT DETECTED Final   Neisseria meningitidis NOT DETECTED NOT DETECTED Final   Pseudomonas aeruginosa NOT DETECTED NOT DETECTED Final   Stenotrophomonas maltophilia NOT DETECTED NOT DETECTED Final   Candida albicans NOT DETECTED NOT DETECTED Final   Candida auris NOT DETECTED NOT DETECTED Final   Candida glabrata DETECTED (A) NOT DETECTED Final    Comment: CRITICAL RESULT CALLED TO,  READ BACK BY AND VERIFIED WITH: PHARMD E.SINCLAIR AT 0831 ON 01/04/2024 BY T.SAAD.    Candida krusei NOT DETECTED NOT DETECTED Final   Candida  parapsilosis NOT DETECTED NOT DETECTED Final   Candida tropicalis NOT DETECTED NOT DETECTED Final   Cryptococcus neoformans/gattii NOT DETECTED NOT DETECTED Final    Comment: Performed at [facility], [address] [place], [name] [address]    Corean Fireman, MSN, NP-C [facility] Pager: [phone]  01/04/2024 4:09 PM    Total Encounter Time: 30 m

## 2024-01-04 NOTE — Progress Notes (Signed)
 eLink Physician-Brief Progress Note Patient Name: Derek Thompson DOB: [birth year] MRN: [id number]   Date of Service  01/04/2024  HPI/Events of Note  Seems to be having recurrent runs of NSVT and ectopy.  Seems consistent with his underlying history of end-stage heart failure, CKD and admission for AKI.    eICU Interventions  Electrolytes are appropriate, no intervention really indicated at this point.  Note full CODE STATUS  Of note, catheter seems to be obstructing intermittently, fixed with repositioning.     Intervention Category Intermediate Interventions: Arrhythmia - evaluation and management  Tremeka Helbling 01/04/2024, 4:15 AM

## 2024-01-04 NOTE — Progress Notes (Signed)
 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 His course has been significant for suspected cathter associated UTI s/p IV antibiotics from 7/22-7/27. He also developed AKI on CKD and further work-up revealed multiple myeloma on bone marrow biopsy. In early August he was cleared for hospital discharge but was unable to find an accepting facility and did not have a safe discharge dispo due to homelessness. He was enrolled in the [facility] program. On 8/29 he was transferred to the ICU for somnolence, hypotension and new fever with concern for mixed septic and cardiogenic shock. He was briefly vasopressors, started on dobutamine  and resumed on IV Zosyn  with presumed urinary source of infection. He was transferred to the floor on 9/1 and IMTS resumed care 9/2. He subsequently developed new fever, tachycardia and hypotension prompting PCCM consult and return to ICU.   Pertinent  Medical History  Heart failure with reduced EF, baseline in the 20 range, urinary retention secondary to history of prostate cancer with chronic Foley, chronic right lower extremity osteomyelitis, CKD stage IIIb.  Significant Hospital Events: Including procedures, antibiotic start and stop dates in addition to other pertinent events   7/4 Admitted 7/17 Bone marrow biopsy 7/18 s/p R BKA 8/27-8/28 rising BUN and Cr 8/28 new fever, hematuria, AMS and shock requiring NE, echocardiogram showing EF<20% and moderate RV dysfunction, started on  dobutamine   9/3 off pressors and dobutamine , undergoing CBI 9/4 runs of NSVT overnight, stable without pressors, BC + for candida, ID consult and micafungin    Interim History / Subjective:  runs of NSVT overnight, stable without pressors, BC + for candida, ID consult and micafungin  Steroids started per oncology-Decadron  20mg  weekly    Objective    Blood pressure (!) 84/62, pulse 83, temperature 97.6 F (36.4 C), temperature source Axillary, resp. rate 13, height 6' (1.829 m), weight 58.1 kg, SpO2 100%. CVP:  [0 mmHg-3 mmHg] 0 mmHg      Intake/Output Summary (Last 24 hours) at 01/04/2024 0801 Last data filed at 01/04/2024 0758 Gross per 24 hour  Intake 15787.65 ml  Output 60079 ml  Net -24132.35 ml   Filed Weights   01/01/24 0600 01/02/24 0349 01/03/24 0600  Weight: 42.7 kg 59 kg 58.1 kg     General:  thin, chronically ill appearing M resting in bed in NAD HEENT: MM pink/moist Neuro: alert and oriented, mo focal deficits  CV: s1s2 rrr, no m/r/g PULM:  clear bilaterally on RA GI/GU: soft, non-tender, CBI ongoing, no noted clots in hematuria  Extremities: warm/dry, s/p R BKA, no edema     Resolved problem list   Assessment and Plan   Septic shock Improved  Acute on chronic systolic heart failure Candida Fungemia  Shock physiology improved but now growing candida glabrata in 1/2 BC  CT AP on 8/29 demonstrated bilateral hydronephrosis and hydroureter concerning for infectious cystitis with bilateral ascending urinary tract infection.  Less suspicious for cardiogenic component given off pressors and dobutamine  and coox >60% -add Micafungin  and ID will follow -  continue empiric meropenem  in the setting of hemorrhagic cystitis  - Continue to trend Co-ox - Foley placed 8/29 - Continue to hold BB in the setting of acute shock - Hold ASA in the setting of hematuria - Hold statin given transaminitis  - Advanced heart failure team following, appreciate recommendations -  Palliative care consulted, pt tells me that he has a son in jail who is innocent and wants to keep pushing to live for him    Known LV thrombus Previously on Eliquis .  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, due to monoclonal gammopathy  History of urinary retention and prostate cancer and radiation therapy with chronic foley Hematuria  Acute on chronic anemia Baseline creatinine felt to be in the mid 2-3 range, has been slowly up-trending, now over 6, unclear UOP given CBI -appreciate nephology following, pt says he is amenable to dialysis even if this is a long term situatiuon - Avoid nephrotoxins, renally dose medications and ensure adequate renal perfusion -appreciate urology recs, continue CBI for several days, not a good candidate for anesthesia/cystoscopy currently  -transfuse for Hgb <7, currently 10.5    Acute on chronic hyponatremia Likely multifactorial including pseuohyponatremia in the setting of monoclonal gammopathy and heart failure  -improved slightly to 129 -continue 1200cc fluid restriction  Type 2 diabetes with hyperglycemia - Continue SSI and Lantus   Hypoalbuminemia Protein calorie malnutrition  - Supportive care, nutritional support  Chronic osteomyelitis s/p R BKA (7/18)  Deconditioning - PT/OT when appropriate   Elevated protein gap with monoclonal gammopathy  and suspected multiple myeloma from bone marrow biopsy on 7/17 -seen by oncology  - Spoke with Dr. Autumn with Oncology yesterday, recommended Dexamathasone 20mg  po or IV q weekly, given 9/3   Labs   CBC: Recent Labs  Lab 12/29/23 0926 12/29/23 1323 01/02/24 0632 01/02/24 1509 01/03/24 0500 01/03/24 1700 01/04/24 0224  WBC 18.0*   < > 8.3 14.6* 18.4* 13.6* 9.6  NEUTROABS 16.7*  --   --  13.5*  --   --   --   HGB 8.0*   < > 10.1* 10.2* 9.8* 9.4* 10.5*  HCT 24.5*   < > 30.1* 31.0* 29.8* 28.4* 31.9*  MCV 84.2   < > 83.8 84.9 85.9  86.1 86.4  PLT 292   < > 231 251 209 201 222   < > = values in this interval not displayed.    Basic Metabolic Panel: Recent Labs  Lab 12/29/23 0422 12/29/23 1200 01/01/24 0457 01/02/24 0632 01/02/24 1509 01/02/24 1849 01/03/24 0500 01/04/24 0224  NA 121*   < > 126* 129* 127*  --  125* 129*  K 5.1   < > 4.1 4.3 4.2  --  4.5 4.5  CL 84*   < > 91* 92* 93*  --  93* 93*  CO2 20*   < > 22 21* 19*  --  17* 18*  GLUCOSE 183*   < > 129* 110* 107*  --  81 197*  BUN 126*   < > 112* 100* 109*  --  106* 114*  CREATININE 3.96*   < > 5.02* 4.88* 5.05*  --  5.60* 6.03*  CALCIUM  8.8*   < > 8.3* 8.3* 8.0*  --  8.1* 8.1*  MG  --   --  2.2 2.4  --  2.2  --  2.3  PHOS 3.5  --  5.1* 4.6  --   --   --   --    < > =  values in this interval not displayed.   GFR: Estimated Creatinine Clearance: 10.2 mL/min (A) (by C-G formula based on SCr of 6.03 mg/dL (H)). Recent Labs  Lab 12/29/23 1231 12/29/23 1323 12/29/23 1549 12/29/23 2135 12/30/23 0332 12/30/23 1047 12/30/23 1601 01/02/24 0632 01/02/24 1509 01/02/24 1639 01/03/24 0500 01/03/24 1700 01/04/24 0224  PROCALCITON 0.86  --   --   --   --   --   --  3.29  --   --   --   --   --   WBC  --    < >  --   --    < >  --    < > 8.3 14.6*  --  18.4* 13.6* 9.6  LATICACIDVEN  --   --  6.6* 1.7  --  1.4  --   --   --  0.9  --   --   --    < > = values in this interval not displayed.    Liver Function Tests: Recent Labs  Lab 12/29/23 0422 12/29/23 0926 01/02/24 1509  AST  --  28 178*  ALT  --  23 372*  ALKPHOS  --  51 43  BILITOT  --  0.8 0.6  PROT  --  8.3* 7.4  ALBUMIN 2.3* 2.3* 1.8*   No results for input(s): LIPASE, AMYLASE in the last 168 hours. No results for input(s): AMMONIA in the last 168 hours.  ABG    Component Value Date/Time   PHART 7.488 (H) 12/29/2023 1620   PCO2ART 20.9 (L) 12/29/2023 1620   PO2ART 98 12/29/2023 1620   HCO3 15.9 (L) 12/29/2023 1620   TCO2 17 (L) 12/29/2023 1620   ACIDBASEDEF 7.0 (H)  12/29/2023 1620   O2SAT 61 01/04/2024 0544     Coagulation Profile: No results for input(s): INR, PROTIME in the last 168 hours.  Cardiac Enzymes: Recent Labs  Lab 12/29/23 1200  CKTOTAL 257  CKMB 27.4*    HbA1C: Hgb A1c MFr Bld  Date/Time Value Ref Range Status  09/02/2023 06:15 AM 8.7 (H) 4.8 - 5.6 % Final    Comment:    (NOTE) Pre diabetes:          5.7%-6.4%  Diabetes:              >6.4%  Glycemic control for   <7.0% adults with diabetes   06/13/2023 07:40 PM 13.3 (H) 4.8 - 5.6 % Final    Comment:    (NOTE) Pre diabetes:          5.7%-6.4%  Diabetes:              >6.4%  Glycemic control for   <7.0% adults with diabetes     CBG: Recent Labs  Lab 01/03/24 2009 01/04/24 0004 01/04/24 0135 01/04/24 0403 01/04/24 0732  GLUCAP 192* 185* 192* 200* 219*       CRITICAL CARE Performed by: Leita SAUNDERS Markiesha Delia   Total critical care time: 42 minutes  Critical care time was exclusive of separately billable procedures and treating other patients.  Critical care was necessary to treat or prevent imminent or life-threatening deterioration.  Critical care was time spent personally by me on the following activities: development of treatment plan with patient and/or surrogate as well as nursing, discussions with consultants, evaluation of patient's response to treatment, examination of patient, obtaining history from patient or surrogate, ordering and performing treatments and interventions, ordering and review of laboratory studies, ordering and review of radiographic  studies, pulse oximetry and re-evaluation of patient's condition.    Leita SAUNDERS Kashari Chalmers, PA-C  Pulmonary & Critical care See Amion for pager If no response to pager , please call [phone] until 7pm After 7:00 pm call Elink  [phone]

## 2024-01-04 NOTE — Progress Notes (Signed)
 PHARMACY - PHYSICIAN COMMUNICATION CRITICAL VALUE ALERT - BLOOD CULTURE IDENTIFICATION (BCID)  Derek Thompson is an 63 y.o. male who presented to [facility] on 11/02/2023 with a chief complaint of malaise and fatigue.   Assessment:  63 year old male with prolonged hospital stay now re-admitted to ICU due to hypotension/septic shock. Now with candida glabrata in 1/4 blood cultures from 9/2. Notably patient has CVC.   Name of physician (or Provider) Contacted: ID team/CCM rounding team/HF rounding team  Current antibiotics: Meropenem    Changes to prescribed antibiotics recommended:  Add micafungin  100 mg every 24 hours   Results for orders placed or performed during the hospital encounter of 11/02/23  Blood Culture ID Panel (Reflexed) (Collected: 01/02/2024  9:34 AM)  Result Value Ref Range   Enterococcus faecalis NOT DETECTED NOT DETECTED   Enterococcus Faecium NOT DETECTED NOT DETECTED   Listeria monocytogenes NOT DETECTED NOT DETECTED   Staphylococcus species NOT DETECTED NOT DETECTED   Staphylococcus aureus (BCID) NOT DETECTED NOT DETECTED   Staphylococcus epidermidis NOT DETECTED NOT DETECTED   Staphylococcus lugdunensis NOT DETECTED NOT DETECTED   Streptococcus species NOT DETECTED NOT DETECTED   Streptococcus agalactiae NOT DETECTED NOT DETECTED   Streptococcus pneumoniae NOT DETECTED NOT DETECTED   Streptococcus pyogenes NOT DETECTED NOT DETECTED   A.calcoaceticus-baumannii NOT DETECTED NOT DETECTED   Bacteroides fragilis NOT DETECTED NOT DETECTED   Enterobacterales NOT DETECTED NOT DETECTED   Enterobacter cloacae complex NOT DETECTED NOT DETECTED   Escherichia coli NOT DETECTED NOT DETECTED   Klebsiella aerogenes NOT DETECTED NOT DETECTED   Klebsiella oxytoca NOT DETECTED NOT DETECTED   Klebsiella pneumoniae NOT DETECTED NOT DETECTED   Proteus species NOT DETECTED NOT DETECTED   Salmonella species NOT DETECTED NOT DETECTED   Serratia marcescens NOT DETECTED NOT DETECTED    Haemophilus influenzae NOT DETECTED NOT DETECTED   Neisseria meningitidis NOT DETECTED NOT DETECTED   Pseudomonas aeruginosa NOT DETECTED NOT DETECTED   Stenotrophomonas maltophilia NOT DETECTED NOT DETECTED   Candida albicans NOT DETECTED NOT DETECTED   Candida auris NOT DETECTED NOT DETECTED   Candida glabrata DETECTED (A) NOT DETECTED   Candida krusei NOT DETECTED NOT DETECTED   Candida parapsilosis NOT DETECTED NOT DETECTED   Candida tropicalis NOT DETECTED NOT DETECTED   Cryptococcus neoformans/gattii NOT DETECTED NOT DETECTED    Damien Quiet, PharmD, BCPS, BCIDP Infectious Diseases Clinical Pharmacist Phone: [phone] 01/04/2024  8:33 AM

## 2024-01-04 NOTE — Progress Notes (Signed)
 Occupational Therapy Treatment Patient Details Name: Derek Thompson MRN: [id number] DOB: Sep 24, 1960 Today's Date: 01/04/2024   History of present illness Pt is a 63 y/o M presenting to ED on 11/02/23 with lethargy. Pt found to be tachycardic, hyperkalemic, and hyponatremic. 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 Pt transferred to icu on 8/29. 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   OT comments  OT/PT [facility] session: Patient in good spirits and states an improved appetite. Patient requiring increased assistance to get to EOB due to weakness and lines. Patient performed standing from EOB to allow for peri area bathing and performed gown change seated on EOB. Patient requiring increased assistance to return to supine due to fatigue. Acute OT to continue to follow with [facility] program to address established goals.       If plan is discharge home, recommend the following:  A lot of help with walking and/or transfers;A lot of help with bathing/dressing/bathroom;Assistance with cooking/housework;Direct supervision/assist for medications management;Direct supervision/assist for financial management;Assist for transportation;Help with stairs or ramp for entrance   Equipment Recommendations  Other (comment) (defer)    Recommendations for Other Services      Precautions / Restrictions Precautions Precautions: Fall;Other (comment) Recall of Precautions/Restrictions: Impaired Precaution/Restrictions Comments: NG Tube, Bladder irrigation, central line R BKA- no pillows under knee Required Braces or Orthoses: Other  Brace Other Brace: prosthetist request use of limb protector in bed to keep knee extension at night Restrictions Weight Bearing Restrictions Per Provider Order: No RLE Weight Bearing Per Provider Order: Non weight bearing Other Position/Activity Restrictions: shrinker       Mobility Bed Mobility Overal bed mobility: Needs Assistance Bed Mobility: Supine to Sit, Sit to Supine     Supine to sit: Mod assist, +2 for physical assistance Sit to supine: Total assist, +2 for physical assistance   General bed mobility comments: increased time due to lines and use of bed pad to assist with getting to EOB    Transfers Overall transfer level: Needs assistance Equipment used: Rolling walker (2 wheels) Transfers: Sit to/from Stand Sit to Stand: From elevated surface, Mod assist, +2 safety/equipment           General transfer comment: stood x2 from EOB to allow for peri area bathing wtih mod assist +2 to stand and support while standing due to weakness     Balance Overall balance assessment: Needs assistance Sitting-balance support: Bilateral upper extremity supported, Feet supported, Feet unsupported Sitting balance-Leahy Scale: Fair Sitting balance - Comments: EOB   Standing balance support: Bilateral upper extremity supported, During functional activity Standing balance-Leahy Scale: Poor Standing balance comment: able to stand with moderate outside support                           ADL either performed or assessed with clinical judgement   ADL Overall ADL's : Needs assistance/impaired             Lower Body Bathing: Maximal assistance;Sit to/from stand Lower Body Bathing Details (indicate cue type and reason): stood to allow for peri area bathing Upper Body Dressing : Minimal assistance;Sitting  Upper Body Dressing Details (indicate cue type and reason): performed gown change with assistance due to lines Lower Body Dressing: Maximal assistance;Bed level Lower  Body Dressing Details (indicate cue type and reason): to donn sock                    Extremity/Trunk Assessment              Vision       Perception     Praxis     Communication Communication Communication: Impaired Factors Affecting Communication: Difficulty expressing self   Cognition Arousal: Alert, Lethargic Behavior During Therapy: Flat affect Cognition: Cognition impaired                               Following commands: Impaired Following commands impaired: Follows one step commands with increased time      Cueing   Cueing Techniques: Verbal cues, Gestural cues, Tactile cues  Exercises      Shoulder Instructions       General Comments BP in supine 110/66 with sitting up BP 107/96 with return to supine BP 86/68 HR in 100s    Pertinent Vitals/ Pain       Pain Assessment Pain Assessment: Faces Faces Pain Scale: Hurts even more Pain Location: bladder with standing Pain Descriptors / Indicators: Grimacing, Tiring Pain Intervention(s): Limited activity within patient's tolerance, Monitored during session, Repositioned  Home Living Family/patient expects to be discharged to:: Skilled nursing facility                                        Prior Functioning/Environment              Frequency  Min 5X/week        Progress Toward Goals  OT Goals(current goals can now be found in the care plan section)  Progress towards OT goals: Progressing toward goals  Acute Rehab OT Goals Patient Stated Goal: to geel better OT Goal Formulation: With patient Time For Goal Achievement: 02/13/24 Potential to Achieve Goals: Fair ADL Goals Pt Will Perform Grooming: with set-up;sitting Pt Will Perform Lower Body Bathing: with min assist;sitting/lateral leans Pt Will Perform Upper Body Dressing: with set-up;sitting Pt Will Perform Lower Body Dressing: with min assist;sitting/lateral leans Pt Will Transfer to Toilet: with  mod assist;stand pivot transfer;bedside commode Pt Will Perform Toileting - Clothing Manipulation and hygiene: with min assist;sitting/lateral leans Pt/caregiver will Perform Home Exercise Program: Increased strength;Both right and left upper extremity;With theraband;With theraputty;Independently;With written HEP provided Additional ADL Goal #1: Pt will be able to follow 1 step commands with >50% accuracy to maximize participation in ADLs  Plan      Co-evaluation    PT/OT/SLP Co-Evaluation/Treatment: Yes Reason for Co-Treatment: To address functional/ADL transfers (decreased functional ability) PT goals addressed during session: Mobility/safety with mobility OT goals addressed during session: ADL's and self-care      AM-PAC OT 6 Clicks Daily Activity     Outcome Measure   Help from another person eating meals?: None Help from another person taking care of personal grooming?: A Little Help from another person toileting, which includes using toliet, bedpan, or urinal?: Total Help from another person bathing (including washing, rinsing, drying)?: A Lot Help from another person to put on and taking off regular upper body clothing?: A Little Help from another person to put  on and taking off regular lower body clothing?: Total 6 Click Score: 14    End of Session Equipment Utilized During Treatment: Rolling walker (2 wheels)  OT Visit Diagnosis: Unsteadiness on feet (R26.81);Other abnormalities of gait and mobility (R26.89);Muscle weakness (generalized) (M62.81);Other symptoms and signs involving cognitive function Pain - part of body:  (bladder pain)   Activity Tolerance Patient limited by fatigue   Patient Left in bed;with call bell/phone within reach;with bed alarm set   Nurse Communication Mobility status        Time: 9066-8994 OT Time Calculation (min): 32 min  Charges: OT General Charges $OT Visit: 1 Visit OT Treatments $Self Care/Home Management : 8-22 mins  Derek Thompson, OTA Acute Rehabilitation Services  Office [phone]   Derek Thompson 01/04/2024, 1:58 PM

## 2024-01-04 NOTE — Progress Notes (Signed)
 PHARMACY - ANTICOAGULATION CONSULT NOTE  Pharmacy Consult:  Heparin  Indication: LV thrombus  Allergies  Allergen Reactions   Cefepime  Other (See Comments)    Cefepime  neurotoxicity 11/07/23    Patient Measurements: Height: 6' (182.9 cm) Weight: 58.1 kg (128 lb 1.4 oz) IBW/kg (Calculated) : 77.6 HEPARIN  DW (KG): 42.1  Vital Signs: BP: 84/62 (09/04 0600) Pulse Rate: 83 (09/04 0600)  Labs: Recent Labs    01/02/24 0644 01/02/24 0645 01/02/24 1509 01/02/24 1849 01/03/24 0500 01/03/24 1700 01/04/24 0224 01/04/24 0544  HGB  --   --  10.2*  --  9.8* 9.4* 10.5*  --   HCT  --   --  31.0*  --  29.8* 28.4* 31.9*  --   PLT  --   --  251  --  209 201 222  --   APTT 57*  --   --  82* 72*  --   --   --   HEPARINUNFRC  --    < >  --   --  0.47 0.37  --  0.39  CREATININE  --   --  5.05*  --  5.60*  --  6.03*  --    < > = values in this interval not displayed.    Estimated Creatinine Clearance: 10.2 mL/min (A) (by C-G formula based on SCr of 6.03 mg/dL (H)).   Assessment: 66 YOM with  LV thrombus off Eliquis  due to hematuria, last dose 8/29 AM.  CBC stable and Pharmacy consulted to dose IV heparin .  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  Confirmed weight of ~42 kg.   01/04/24: Heparin  level 0.39, therapeutic on heparin  1150 units/hr. Frank hematuria and bloody clots still remain. No issues with infusion running per RN. Discussed with Urology MD, will continue with the heparin  infusion today. CBC remains stable (Hgb 10.5, PLT 222). Last transfusion on 01/01/24.   Goal of Therapy:  Heparin  level 0.3-0.5 units/ml d/t bleeding risk Monitor platelets by anticoagulation protocol: Yes   Plan:  Cont heparin  gtt 1150 units/hr to target lower end of range Daily heparin  level and CBC Monitor for hematuria resolution  Morna Breach, PharmD PGY2 Cardiology Pharmacy Resident 01/04/2024 9:36 AM

## 2024-01-04 NOTE — Progress Notes (Signed)
 [facility] Progress Note    Assessment/ Plan:   AKI on CKD -suspect ATN at this point. Causes are likely related to recent shock/decrease renal perfusion, MGRS, obstruction. Received contrast on 8/29. repeat renal ultrasound still with hydronephrosis-urology following -Cr continues to rise, up to 6.03-yet to peak -no current indications for renal replacement therapy at this junction but seems that he is heading down this route. Discussed in detail with patient today. I shared  my concerns in regards to doing dialysis in him particularly his HFrEF, already low BP, low functional status (BKA) and now fungemic. I recommended against dialysis. He will still think about this some more. -at this junction, I recommend continuing supportive care -Avoid nephrotoxic medications including NSAIDs and iodinated intravenous contrast exposure unless the latter is absolutely indicated.  Preferred narcotic agents for pain control are hydromorphone , fentanyl , and methadone. Morphine  should not be used. Avoid Baclofen and avoid oral sodium phosphate  and magnesium  citrate based laxatives / bowel preps. Continue strict Input and Output monitoring. Will monitor the patient closely with you and intervene or adjust therapy as indicated by changes in clinical status/labs    Septic shock Fungemia Multifocal pneumonia -abx/micofungin per primary service -off pressors now. Keep MAP >65 to ensure adequate renal perfusion -ID following   Urinary retention Hematuria -urology following, undergoing CBI    LV thrombus -on heparin  drip   Multiple Myeloma -diagnosed via bone marrow biopsy. Plan to follow up with oncology. Given his acute/current issues along with functional status, I do not foresee him undergoing chemo at this time given his current comorbidities   Hyponatremia -likely secondary to pseudohyponatremia secondary to monoclonal gammopathy and AKI. Na up to 129   Acidosis -secondary to  AKI, trend for now, stable   Chronic osteomyelitis s/p right BKA -per primary service   Anemia -transfuse prn for Hgb <7 -hgb stable 10.5   Recommendations conveyed to primary service.  Guarded prognosis.    Subjective:   Patient seen and examined. Feels well. Denies any N/V/loss of appetite, dysgeusia, CP, SOB. He did contemplate doing dialysis and he is amenable for it. Wants to live as long as possible to see his grandkids grow up. Now fungemic.   Objective:   BP (!) 86/68 (BP Location: Right Arm)   Pulse (!) 111   Temp 97.6 F (36.4 C) (Axillary)   Resp (!) 26   Ht 6' (1.829 m)   Wt 58.1 kg   SpO2 (!) 82%   BMI 17.37 kg/m   Intake/Output Summary (Last 24 hours) at 01/04/2024 1115 Last data filed at 01/04/2024 1041 Gross per 24 hour  Intake 16024.23 ml  Output 60579 ml  Net -23395.77 ml   Weight change:   Physical Exam: Gen: NAD, chronically ill appearing CVS: rrr Resp: cta bl Abd: soft, nt/nd Ext: no edema, right BKA Neuro: awake, alert, no tremors  Imaging: US  RENAL Result Date: 01/03/2024 CLINICAL DATA:  Hydronephrosis. EXAM: RENAL / URINARY TRACT ULTRASOUND COMPLETE COMPARISON:  Ultrasound dated 10/20/2023 and CT abdomen pelvis dated 12/29/2023. FINDINGS: Right Kidney: Renal measurements: 11.6 x 5.7 x 6.7 cm = volume: 230 mL. Diffuse increased renal parenchymal echogenicity. There is moderate right hydronephrosis. No shadowing stone. There is dilatation of the visualized proximal right ureter. Left Kidney: Renal measurements: 11.7 x 5.1 x 5.5 cm = volume: 170 mL. Increased renal parenchymal echogenicity. Moderate left hydronephrosis. No shadowing stone. Faint echogenic content within the renal collecting system likely proteinaceous debris. Bladder: The urinary bladder is decompressed around  a Foley catheter. There is diffuse sequential thickening of the bladder wall concerning for cystitis. Echogenic debris surrounds the Foley catheter within the bladder lumen may  represent proteinaceous content or blood clot. Other: None. IMPRESSION: 1. Moderate bilateral hydronephrosis.  No shadowing stone. 2. Debris within the left renal collecting system likely proteinaceous content and suggestive of pyelonephritis. Correlation with urinalysis recommended. 3. Thickened bladder wall. Complex debris within the bladder likely proteinaceous or hemorrhagic content. Clinical correlation is recommended. Electronically Signed   By: Vanetta Chou M.D.   On: 01/03/2024 19:52   DG Chest Port 1 View Result Date: 01/02/2024 CLINICAL DATA:  [id number] Sepsis [facility]) [id number] EXAM: PORTABLE CHEST 1 VIEW COMPARISON:  December 30, 2023 FINDINGS: The cardiomediastinal silhouette is unchanged in contour.RIGHT neck CVC with tip terminating over the expected area of the superior cavoatrial junction. Atherosclerotic calcifications. No pleural effusion. No pneumothorax. No acute pleuroparenchymal abnormality. IMPRESSION: No acute cardiopulmonary abnormality. Electronically Signed   By: Corean Salter M.D.   On: 01/02/2024 17:09    Labs: BMET Recent Labs  Lab 12/29/23 0422 12/29/23 1200 12/30/23 2009 12/31/23 0400 01/01/24 0457 01/02/24 0632 01/02/24 1509 01/03/24 0500 01/04/24 0224  NA 121*   < > 120* 123* 126* 129* 127* 125* 129*  K 5.1   < > 4.3 4.1 4.1 4.3 4.2 4.5 4.5  CL 84*   < > 87* 90* 91* 92* 93* 93* 93*  CO2 20*   < > 22 21* 22 21* 19* 17* 18*  GLUCOSE 183*   < > 124* 88 129* 110* 107* 81 197*  BUN 126*   < > 130* 127* 112* 100* 109* 106* 114*  CREATININE 3.96*   < > 5.00* 5.03* 5.02* 4.88* 5.05* 5.60* 6.03*  CALCIUM  8.8*   < > 8.0* 8.1* 8.3* 8.3* 8.0* 8.1* 8.1*  PHOS 3.5  --   --   --  5.1* 4.6  --   --   --    < > = values in this interval not displayed.   CBC Recent Labs  Lab 12/29/23 0926 12/29/23 1323 01/02/24 1509 01/03/24 0500 01/03/24 1700 01/04/24 0224  WBC 18.0*   < > 14.6* 18.4* 13.6* 9.6  NEUTROABS 16.7*  --  13.5*  --   --   --   HGB 8.0*   < > 10.2*  9.8* 9.4* 10.5*  HCT 24.5*   < > 31.0* 29.8* 28.4* 31.9*  MCV 84.2   < > 84.9 85.9 86.1 86.4  PLT 292   < > 251 209 201 222   < > = values in this interval not displayed.    Medications:     acetaminophen   1,000 mg Oral Q6H   atorvastatin   80 mg Oral Daily   Chlorhexidine  Gluconate Cloth  6 each Topical Daily   dexamethasone  (DECADRON ) injection  20 mg Intravenous Weekly   ezetimibe   10 mg Oral Daily   feeding supplement  237 mL Oral BID BM   Gerhardt's butt cream   Topical BID   insulin  aspart  0-6 Units Subcutaneous Q4H   mupirocin  ointment   Nasal BID      Ephriam Stank, MD [facility] 01/04/2024, 11:15 AM

## 2024-01-05 ENCOUNTER — Inpatient Hospital Stay (HOSPITAL_COMMUNITY): Payer: MEDICAID

## 2024-01-05 DIAGNOSIS — I5022 Chronic systolic (congestive) heart failure: Secondary | ICD-10-CM | POA: Diagnosis not present

## 2024-01-05 DIAGNOSIS — R6521 Severe sepsis with septic shock: Secondary | ICD-10-CM

## 2024-01-05 DIAGNOSIS — B377 Candidal sepsis: Secondary | ICD-10-CM | POA: Diagnosis not present

## 2024-01-05 DIAGNOSIS — A419 Sepsis, unspecified organism: Secondary | ICD-10-CM | POA: Diagnosis not present

## 2024-01-05 DIAGNOSIS — N3001 Acute cystitis with hematuria: Secondary | ICD-10-CM | POA: Diagnosis not present

## 2024-01-05 DIAGNOSIS — Z7189 Other specified counseling: Secondary | ICD-10-CM | POA: Diagnosis not present

## 2024-01-05 DIAGNOSIS — I5023 Acute on chronic systolic (congestive) heart failure: Secondary | ICD-10-CM | POA: Diagnosis not present

## 2024-01-05 DIAGNOSIS — I5020 Unspecified systolic (congestive) heart failure: Secondary | ICD-10-CM | POA: Diagnosis not present

## 2024-01-05 DIAGNOSIS — N179 Acute kidney failure, unspecified: Secondary | ICD-10-CM | POA: Diagnosis not present

## 2024-01-05 DIAGNOSIS — B49 Unspecified mycosis: Secondary | ICD-10-CM

## 2024-01-05 DIAGNOSIS — C9000 Multiple myeloma not having achieved remission: Secondary | ICD-10-CM | POA: Diagnosis not present

## 2024-01-05 DIAGNOSIS — Z515 Encounter for palliative care: Secondary | ICD-10-CM | POA: Diagnosis not present

## 2024-01-05 LAB — CULTURE, BLOOD (ROUTINE X 2)
Culture: NO GROWTH
Culture: NO GROWTH
Special Requests: ADEQUATE
Special Requests: ADEQUATE

## 2024-01-05 LAB — CBC
HCT: 25.4 % — ABNORMAL LOW (ref 39.0–52.0)
HCT: 26.6 % — ABNORMAL LOW (ref 39.0–52.0)
HCT: 20.3 % — ABNORMAL LOW (ref 39.0–52.0)
Hemoglobin: 8.5 g/dL — ABNORMAL LOW (ref 13.0–17.0)
Hemoglobin: 8.7 g/dL — ABNORMAL LOW (ref 13.0–17.0)
Hemoglobin: 6.8 g/dL — CL (ref 13.0–17.0)
MCH: 28.3 pg (ref 26.0–34.0)
MCH: 28.4 pg (ref 26.0–34.0)
MCH: 28.6 pg (ref 26.0–34.0)
MCHC: 33.5 g/dL (ref 30.0–36.0)
MCHC: 32.7 g/dL (ref 30.0–36.0)
MCHC: 33.5 g/dL (ref 30.0–36.0)
MCV: 84.7 fL (ref 80.0–100.0)
MCV: 86.9 fL (ref 80.0–100.0)
MCV: 85.3 fL (ref 80.0–100.0)
Platelets: 198 K/uL (ref 150–400)
Platelets: 218 K/uL (ref 150–400)
Platelets: 168 K/uL (ref 150–400)
RBC: 3 MIL/uL — ABNORMAL LOW (ref 4.22–5.81)
RBC: 3.06 MIL/uL — ABNORMAL LOW (ref 4.22–5.81)
RBC: 2.38 MIL/uL — ABNORMAL LOW (ref 4.22–5.81)
RDW: 17.1 % — ABNORMAL HIGH (ref 11.5–15.5)
RDW: 17.3 % — ABNORMAL HIGH (ref 11.5–15.5)
RDW: 17.4 % — ABNORMAL HIGH (ref 11.5–15.5)
WBC: 17.1 K/uL — ABNORMAL HIGH (ref 4.0–10.5)
WBC: 32.3 K/uL — ABNORMAL HIGH (ref 4.0–10.5)
WBC: 26.9 K/uL — ABNORMAL HIGH (ref 4.0–10.5)
nRBC: 0 % (ref 0.0–0.2)
nRBC: 0 % (ref 0.0–0.2)
nRBC: 0 % (ref 0.0–0.2)

## 2024-01-05 LAB — BASIC METABOLIC PANEL WITH GFR
Anion gap: 15 (ref 5–15)
BUN: 130 mg/dL — ABNORMAL HIGH (ref 8–23)
CO2: 20 mmol/L — ABNORMAL LOW (ref 22–32)
Calcium: 8.2 mg/dL — ABNORMAL LOW (ref 8.9–10.3)
Chloride: 90 mmol/L — ABNORMAL LOW (ref 98–111)
Creatinine, Ser: 6.66 mg/dL — ABNORMAL HIGH (ref 0.61–1.24)
GFR, Estimated: 9 mL/min — ABNORMAL LOW (ref >60)
Glucose, Bld: 317 mg/dL — ABNORMAL HIGH (ref 70–99)
Potassium: 4.3 mmol/L (ref 3.5–5.1)
Sodium: 125 mmol/L — ABNORMAL LOW (ref 135–145)

## 2024-01-05 LAB — GLUCOSE, CAPILLARY
Glucose-Capillary: 273 mg/dL — ABNORMAL HIGH (ref 70–99)
Glucose-Capillary: 133 mg/dL — ABNORMAL HIGH (ref 70–99)
Glucose-Capillary: 82 mg/dL (ref 70–99)
Glucose-Capillary: 147 mg/dL — ABNORMAL HIGH (ref 70–99)
Glucose-Capillary: 203 mg/dL — ABNORMAL HIGH (ref 70–99)
Glucose-Capillary: 251 mg/dL — ABNORMAL HIGH (ref 70–99)
Glucose-Capillary: 220 mg/dL — ABNORMAL HIGH (ref 70–99)

## 2024-01-05 LAB — HEPARIN LEVEL (UNFRACTIONATED): Heparin Unfractionated: 0.49 [IU]/mL (ref 0.30–0.70)

## 2024-01-05 LAB — KAPPA/LAMBDA LIGHT CHAINS
Kappa free light chain: 111.7 mg/L — ABNORMAL HIGH (ref 3.3–19.4)
Kappa, lambda light chain ratio: 0.47 (ref 0.26–1.65)
Lambda free light chains: 237.5 mg/L — ABNORMAL HIGH (ref 5.7–26.3)

## 2024-01-05 LAB — PHOSPHORUS: Phosphorus: 6.1 mg/dL — ABNORMAL HIGH (ref 2.5–4.6)

## 2024-01-05 LAB — PREPARE RBC (CROSSMATCH)

## 2024-01-05 LAB — LIPASE, BLOOD: Lipase: 25 U/L (ref 11–51)

## 2024-01-05 LAB — MAGNESIUM: Magnesium: 2.3 mg/dL (ref 1.7–2.4)

## 2024-01-05 MED ORDER — LACTATED RINGERS IV BOLUS
250.0000 mL | Freq: Once | INTRAVENOUS | Status: AC
  Administered 2024-01-05: 250 mL via INTRAVENOUS

## 2024-01-05 MED ORDER — SODIUM CHLORIDE 0.9 % IV SOLN
250.0000 mL | INTRAVENOUS | Status: AC
  Administered 2024-01-05: 250 mL via INTRAVENOUS

## 2024-01-05 MED ORDER — NOREPINEPHRINE 4 MG/250ML-% IV SOLN
0.0000 ug/min | INTRAVENOUS | Status: DC
  Administered 2024-01-05: 2 ug/min via INTRAVENOUS
  Filled 2024-01-05 (×2): qty 250

## 2024-01-05 MED ORDER — OXYCODONE HCL 5 MG PO TABS
5.0000 mg | ORAL_TABLET | Freq: Once | ORAL | Status: AC
  Administered 2024-01-05: 5 mg via ORAL
  Filled 2024-01-05: qty 1

## 2024-01-05 MED ORDER — RENA-VITE PO TABS
1.0000 | ORAL_TABLET | Freq: Every day | ORAL | Status: DC
  Administered 2024-01-05 – 2024-01-08 (×4): 1 via ORAL
  Filled 2024-01-05 (×4): qty 1

## 2024-01-05 MED ORDER — SODIUM CHLORIDE 0.9% IV SOLUTION
Freq: Once | INTRAVENOUS | Status: DC
Start: 2024-01-05 — End: 2024-01-09

## 2024-01-05 NOTE — Progress Notes (Signed)
 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 His course has been significant for suspected cathter associated UTI s/p IV antibiotics from 7/22-7/27. He also developed AKI on CKD and further work-up revealed multiple myeloma on bone marrow biopsy. In early August he was cleared for hospital discharge but was unable to find an accepting facility and did not have a safe discharge dispo due to homelessness. He was enrolled in the [facility]. On 8/29 he was transferred to the ICU for somnolence, hypotension and new fever with concern for mixed septic and cardiogenic shock. He was briefly vasopressors, started on dobutamine  and resumed on IV Zosyn  with presumed urinary source of infection. He was transferred to the floor on 9/1 and IMTS resumed care 9/2. He subsequently developed new fever, tachycardia and hypotension prompting PCCM consult and return to ICU.   Pertinent  Medical History  Heart failure with reduced EF, baseline in the 20 range, urinary retention secondary to history of prostate cancer with chronic Foley, chronic right lower extremity osteomyelitis, CKD stage IIIb.  Significant Hospital Events: Including procedures, antibiotic start and stop dates in addition to other pertinent events   7/4 Admitted 7/17 Bone marrow biopsy 7/18 s/p R BKA 8/27-8/28 rising BUN and Cr 8/28 new fever, hematuria, AMS and shock requiring NE, echocardiogram showing EF<20% and moderate RV dysfunction, started on  dobutamine   9/3 off pressors and dobutamine , undergoing CBI 9/4 runs of NSVT overnight, stable without pressors, BC + for candida, ID consult and micafungin    Interim History / Subjective:  -14.9 L, net negative 41 L(however may be inaccurate with CBI) Afebrile Increasing tachycardia overnight, appears sinus on telemetry.  Foley replaced with 3 way by Urology with concern some clot within bladder.   Alert and interactive this morning, complaints of intermittent nausea.  Offered to call and update family, he would like to be the one to call and update family.   Objective    Blood pressure 121/72, pulse (!) 121, temperature (!) 97.4 F (36.3 C), temperature source Axillary, resp. rate (!) 22, height 6' (1.829 m), weight 59.8 kg, SpO2 100%. CVP:  [0 mmHg-11 mmHg] 11 mmHg      Intake/Output Summary (Last 24 hours) at 01/05/2024 0853 Last data filed at 01/05/2024 0846 Gross per 24 hour  Intake 32007.98 ml  Output 50049 ml  Net -17942.02 ml   Filed Weights   01/02/24 0349 01/03/24 0600 01/05/24 0318  Weight: 59 kg 58.1 kg 59.8 kg    General:  Thin adult male, resting in bed HEENT: MM pink/moist Neuro: alert and oriented, no focal deficits CV: s1s2 rrr, no m/r/g PULM:  clear bilaterally on RA GI/GU: soft, non-tender, CBI ongoing, hematuria noted without clots Extremities: warm/dry, s/p R BKA    Resolved problem list   Assessment and Plan   Septic shock Improved  Acute on chronic systolic heart failure Candida Fungemia  Shock physiology improved but now growing candida glabrata in 1/2 BC  CT AP on 8/29 demonstrated bilateral hydronephrosis  and hydroureter concerning for infectious cystitis with bilateral ascending urinary tract infection.  Less suspicious for cardiogenic component given off pressors and dobutamine  and coox >60% Plan: -ID following: Micafungin  and Rocephin  - Foley placed 8/29 - Elevated WBC this am likely related to steroids yesterday- remains afebrile -  GDMT for HF limited by recent shock state and AKI.   - Hold ASA in the setting of hematuria - Hold statin given transaminitis > repeat labs tomorrow (no abdominal pain) - Advanced heart failure team following, appreciate recommendations - Tachycardic overnight, appears sinus but will obtain EKG - Palliative care consulted, has a son in jail who is innocent and wants to keep pushing to live for him   Known LV thrombus Previously on Eliquis .  - Tolerating heparin  drip   Acute renal failure on CKD stage IIIb, due to monoclonal gammopathy  History of urinary retention and prostate cancer and radiation therapy with chronic foley Hematuria  Acute on chronic anemia Baseline creatinine felt to be in the mid 2-3 range, has been slowly up-trending, now over 6, unclear UOP given CBI -appreciate nephology following, pt says he is amenable to dialysis even if this is a long term situation. No acute indications - Avoid nephrotoxins, renally dose medications and ensure adequate renal perfusion -appreciate urology recs, continue CBI for several days, not a good candidate for anesthesia/cystoscopy currently  -transfuse for Hgb <7, Hgb 10.5>8.5 today. Repeat CBC this afternoon. Will order type and screen.   Acute on chronic hyponatremia Likely multifactorial including pseuohyponatremia in the setting of monoclonal gammopathy and heart failure  -improved slightly to 129 -continue 1200cc fluid restriction  Type 2 diabetes with hyperglycemia - Continue SSI q 4 hours,improved control this morning  Hypoalbuminemia Protein calorie malnutrition  - Supportive care, nutritional support  Chronic osteomyelitis s/p R BKA (7/18)  Deconditioning - PT/OT when appropriate   Elevated protein gap with monoclonal gammopathy  and suspected multiple myeloma from bone marrow biopsy on 7/17 - Oncology following, recommended Dexamathasone 20mg  po or IV q weekly, given 9/3  Foley: CBI CVC: none Diet: Regular  Diet DVT ppx: On systemic anticoagulation GI ppx: not applicable  Continue ICU care given decline in H/H with hematuria, tachycardia for close monitoring.   Labs   CBC: Recent Labs  Lab 12/29/23 0926 12/29/23 1323 01/02/24 1509 01/03/24 0500 01/03/24 1700 01/04/24 0224 01/05/24 0248  WBC 18.0*   < > 14.6* 18.4* 13.6* 9.6 17.1*  NEUTROABS 16.7*  --  13.5*  --   --   --   --   HGB 8.0*   < > 10.2* 9.8* 9.4* 10.5* 8.5*  HCT 24.5*   < > 31.0* 29.8* 28.4* 31.9* 25.4*  MCV 84.2   < > 84.9 85.9 86.1 86.4 84.7  PLT 292   < > 251 209 201 222 198   < > = values in this interval not displayed.    Basic Metabolic Panel: Recent Labs  Lab 01/01/24 0457 01/02/24 0632 01/02/24 1509 01/02/24 1849 01/03/24 0500 01/04/24 0224 01/05/24 0248  NA 126* 129* 127*  --  125* 129* 125*  K 4.1 4.3 4.2  --  4.5 4.5 4.3  CL 91* 92* 93*  --  93* 93* 90*  CO2 22 21* 19*  --  17* 18* 20*  GLUCOSE 129* 110* 107*  --  81 197* 317*  BUN 112* 100* 109*  --  106* 114* 130*  CREATININE 5.02* 4.88* 5.05*  --  5.60* 6.03* 6.66*  CALCIUM  8.3*  8.3* 8.0*  --  8.1* 8.1* 8.2*  MG 2.2 2.4  --  2.2  --  2.3 2.3  PHOS 5.1* 4.6  --   --   --   --  6.1*   GFR: Estimated Creatinine Clearance: 9.5 mL/min (A) (by C-G formula based on SCr of 6.66 mg/dL (H)). Recent Labs  Lab 12/29/23 1231 12/29/23 1323 12/29/23 1549 12/29/23 2135 12/30/23 0332 12/30/23 1047 12/30/23 1601 01/02/24 0632 01/02/24 1509 01/02/24 1639 01/03/24 0500 01/03/24 1700 01/04/24 0224 01/05/24 0248  PROCALCITON 0.86  --   --   --   --   --   --  3.29  --   --   --   --   --   --   WBC  --    < >  --   --    < >  --    < > 8.3   < >  --  18.4* 13.6* 9.6 17.1*  LATICACIDVEN  --   --  6.6* 1.7  --  1.4  --   --   --  0.9  --   --   --   --    < > = values in this interval not displayed.    Liver Function Tests: Recent Labs  Lab 12/29/23 0926 01/02/24 1509  AST 28 178*  ALT 23 372*  ALKPHOS 51 43  BILITOT 0.8 0.6  PROT 8.3*  7.4  ALBUMIN 2.3* 1.8*   No results for input(s): LIPASE, AMYLASE in the last 168 hours. No results for input(s): AMMONIA in the last 168 hours.  ABG    Component Value Date/Time   PHART 7.488 (H) 12/29/2023 1620   PCO2ART 20.9 (L) 12/29/2023 1620   PO2ART 98 12/29/2023 1620   HCO3 15.9 (L) 12/29/2023 1620   TCO2 17 (L) 12/29/2023 1620   ACIDBASEDEF 7.0 (H) 12/29/2023 1620   O2SAT 61 01/04/2024 0544     Coagulation Profile: No results for input(s): INR, PROTIME in the last 168 hours.  Cardiac Enzymes: Recent Labs  Lab 12/29/23 1200  CKTOTAL 257  CKMB 27.4*    HbA1C: Hgb A1c MFr Bld  Date/Time Value Ref Range Status  09/02/2023 06:15 AM 8.7 (H) 4.8 - 5.6 % Final    Comment:    (NOTE) Pre diabetes:          5.7%-6.4%  Diabetes:              >6.4%  Glycemic control for   <7.0% adults with diabetes   06/13/2023 07:40 PM 13.3 (H) 4.8 - 5.6 % Final    Comment:    (NOTE) Pre diabetes:          5.7%-6.4%  Diabetes:              >6.4%  Glycemic control for   <7.0% adults with diabetes     CBG: Recent Labs  Lab 01/04/24 1629 01/04/24 2012 01/04/24 2312 01/05/24 0326 01/05/24 0800  GLUCAP 360* 375* 416* 273* 133*       CRITICAL CARE Performed by: Lucie KATHEE Irving   Total critical care time: 30 minutes  Critical care time was exclusive of separately billable procedures and treating other patients.  Critical care was necessary to treat or prevent imminent or life-threatening deterioration.  Critical care was time spent personally by me on the following activities: development of treatment plan with patient and/or surrogate as well as nursing, discussions with consultants, evaluation of patient's response to treatment,  examination of patient, obtaining history from patient or surrogate, ordering and performing treatments and interventions, ordering and review of laboratory studies, ordering and review of radiographic studies, pulse oximetry and  re-evaluation of patient's condition.    Lucie Irving, ACNP [facility] If no response to haiku , please call [phone] until 7pm After 7:00 pm call Elink  [phone]

## 2024-01-05 NOTE — Progress Notes (Signed)
 [facility]  Date of Admission:  11/02/2023     Abx: 9/4-c micafungin  9/02; 9/4-c ceftriaxone     9/3-4 meropenem  8/29-9/3 piptazo 8/29-8/31 linezolid     A/p Hemorrhagic cystitis currently with cbi Candida glabrata blood stream infection Septic shock Chf Lv thrombus MM new diagnosis Aki (not dialysis candidate) Hx prostate cancer s/p local radiation   No osseous foci of concern based on history Eye involvement can be indolent and minimallin symptomatic in candida will need eye exam nonurgently    ---------------- 9/5 id assessment Right internal jugular cvc removed 9/4 Soft sbp and new abd pain today. Urology note 9/4 concerned for blood clot. I query if he could have more obstruction. Cr worsening  Bad EF too so could be gut ischemia in setting infection  Nephrology following -- not dialysis candidate  Prognosis is guarded   Nosocomial infection with clear recent onset and I do not feel we need repeat tte. 8/29 tte at onset of sepsis reviewed. Unless repeat blood cx continues to grow candida, 2 weeks treatment would be fine; taken in consideration of presence of chronic lv thrombus       -continue micafungin  for 2 weeks from last negative blood cx (repeat bcx pending for this afternoon -finish ceftriaxone  2 weeks from 8/29 (until 9/12) -outpatient eye exam -maintain standard isolation precaution -if abd pain persists and persistent hypotension consider abd/pelv ct -discuss with primary team  Principal Problem:   Osteomyelitis of right foot (HCC) Active Problems:   Chronic systolic heart failure (HCC)   CAD (coronary artery disease)   Hyperkalemia   AKI (acute kidney injury) (HCC)   Anemia   Acute thrombus of left ventricle (HCC)   Gangrene of right foot (HCC)   Hyponatremia   HFrEF (heart failure with reduced ejection fraction) (HCC)   Chronic osteomyelitis involving lower leg, right (HCC)   Frailty   Urinary  retention   Generalized weakness   Sinus tachycardia   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 (HCC)   Chronic anemia   Hypoalbuminemia   Monoclonal gammopathy   Hx of right BKA (HCC)   Moderate protein-calorie malnutrition (HCC)   Protein-calorie malnutrition, severe   Allergies  Allergen Reactions   Cefepime  Other (See Comments)    Cefepime  neurotoxicity 11/07/23    Scheduled Meds:  acetaminophen   1,000 mg Oral Q6H   atorvastatin   80 mg Oral Daily   Chlorhexidine  Gluconate Cloth  6 each Topical Daily   dexamethasone  (DECADRON ) injection  20 mg Intravenous Weekly   ezetimibe   10 mg Oral Daily   feeding supplement  237 mL Oral TID BM   Gerhardt's butt cream   Topical BID   insulin  aspart  0-15 Units Subcutaneous Q4H   multivitamin  1 tablet Oral QHS   mupirocin  ointment   Nasal BID   thiamine   100 mg Oral Daily   Continuous Infusions:  sodium chloride      cefTRIAXone  (ROCEPHIN )  IV Stopped (01/04/24 1557)   heparin  1,100 Units/hr (01/05/24 1514)   micafungin  (MYCAMINE ) 100 mg in sodium chloride  0.9 % 100 mL IVPB Stopped (01/05/24 1148)   norepinephrine  (LEVOPHED ) Adult infusion 4 mcg/min (01/05/24 1514)   sodium chloride  irrigation     PRN Meds:.mouth rinse, polyethylene glycol, scopolamine    SUBJECTIVE: Doesn't feel well today No specific complaint    Review of Systems: ROS All other ROS was negative, except mentioned above     OBJECTIVE:  Vitals:   01/05/24 1500 01/05/24 1503 01/05/24 1510 01/05/24 1512  BP: (!) 90/53 (!) 81/45 101/61 100/61  Pulse: (!) 125     Resp: 20     Temp:      TempSrc:      SpO2: 99%     Weight:      Height:       Body mass index is 17.88 kg/m.  Physical Exam General/constitutional: mild distress HEENT: Normocephalic, PER, Conj Clear, EOMI, Oropharynx clear Neck supple CV: rrr no mrg Lungs: clear to auscultation, normal respiratory effort Abd: Soft, mild-moderate tenderness lower abd Ext: no  edema Skin: No Rash Neuro: nonfocal MSK: right bka  Gu -- foley catheter in place   Lab Results Lab Results  Component Value Date   WBC 32.3 (H) 01/05/2024   HGB 8.7 (L) 01/05/2024   HCT 26.6 (L) 01/05/2024   MCV 86.9 01/05/2024   PLT 218 01/05/2024    Lab Results  Component Value Date   CREATININE 6.66 (H) 01/05/2024   BUN 130 (H) 01/05/2024   NA 125 (L) 01/05/2024   K 4.3 01/05/2024   CL 90 (L) 01/05/2024   CO2 20 (L) 01/05/2024    Lab Results  Component Value Date   ALT 372 (H) 01/02/2024   AST 178 (H) 01/02/2024   ALKPHOS 43 01/02/2024   BILITOT 0.6 01/02/2024      Microbiology: Recent Results (from the past 240 hours)  Culture, Urine (Do not remove urinary catheter, catheter placed by urology or difficult to place)     Status: None   Collection Time: 12/27/23 10:05 AM   Specimen: Urine, Catheterized  Result Value Ref Range Status   Specimen Description URINE, CATHETERIZED  Final   Special Requests NONE  Final   Culture   Final    NO GROWTH Performed at [facility], [address] [place], [name] [address]    Report Status 12/28/2023 FINAL  Final  Remove and replace urinary cath (placed > 5 days) then obtain urine culture from new indwelling urinary catheter.     Status: None   Collection Time: 12/29/23  8:17 AM   Specimen: Urine, Catheterized  Result Value Ref Range Status   Specimen Description URINE, CATHETERIZED  Final   Special Requests NONE  Final   Culture   Final    NO GROWTH Performed at [facility], [address] [place], [name] [address]    Report Status 12/30/2023 FINAL  Final  Respiratory (~20 pathogens) panel by PCR     Status: None   Collection Time: 12/29/23  9:21 AM   Specimen: Nasopharyngeal Swab; Respiratory  Result Value Ref Range Status   Adenovirus NOT DETECTED NOT DETECTED Final   Coronavirus 229E NOT DETECTED NOT DETECTED Final    Comment: (NOTE) The Coronavirus on the Respiratory Panel, DOES NOT test for  the novel  Coronavirus (2019 nCoV)    Coronavirus HKU1 NOT DETECTED NOT DETECTED Final   Coronavirus NL63 NOT DETECTED NOT DETECTED Final   Coronavirus OC43 NOT DETECTED NOT DETECTED Final   Metapneumovirus NOT DETECTED NOT DETECTED Final   Rhinovirus / Enterovirus NOT DETECTED NOT DETECTED Final   Influenza A NOT DETECTED NOT DETECTED Final   Influenza B NOT DETECTED NOT DETECTED Final   Parainfluenza Virus 1 NOT DETECTED NOT DETECTED Final   Parainfluenza Virus 2 NOT DETECTED NOT DETECTED Final   Parainfluenza Virus 3 NOT DETECTED NOT DETECTED Final   Parainfluenza Virus 4 NOT  DETECTED NOT DETECTED Final   Respiratory Syncytial Virus NOT DETECTED NOT DETECTED Final   Bordetella pertussis NOT DETECTED NOT DETECTED Final   Bordetella Parapertussis NOT DETECTED NOT DETECTED Final   Chlamydophila pneumoniae NOT DETECTED NOT DETECTED Final   Mycoplasma pneumoniae NOT DETECTED NOT DETECTED Final    Comment: Performed at [facility], [address] [place], [name] [address]  Resp panel by RT-PCR (RSV, Flu A&B, Covid) Anterior Nasal Swab     Status: None   Collection Time: 12/29/23  9:21 AM   Specimen: Anterior Nasal Swab  Result Value Ref Range Status   SARS Coronavirus 2 by RT PCR NEGATIVE NEGATIVE Final   Influenza A by PCR NEGATIVE NEGATIVE Final   Influenza B by PCR NEGATIVE NEGATIVE Final    Comment: (NOTE) The Xpert Xpress SARS-CoV-2/FLU/RSV plus assay is intended as an aid in the diagnosis of influenza from Nasopharyngeal swab specimens and should not be used as a sole basis for treatment. Nasal washings and aspirates are unacceptable for Xpert Xpress SARS-CoV-2/FLU/RSV testing.  Fact Sheet for Patients: BloggerCourse.com  Fact Sheet for Healthcare Providers: SeriousBroker.it  This test is not yet approved or cleared by the [place]  FDA and has been authorized for detection and/or diagnosis of SARS-CoV-2 by FDA  under an Emergency Use Authorization (EUA). This EUA will remain in effect (meaning this test can be used) for the duration of the COVID-19 declaration under Section 564(b)(1) of the Act, 21 U.S.C. section 360bbb-3(b)(1), unless the authorization is terminated or revoked.     Resp Syncytial Virus by PCR NEGATIVE NEGATIVE Final    Comment: (NOTE) Fact Sheet for Patients: BloggerCourse.com  Fact Sheet for Healthcare Providers: SeriousBroker.it  This test is not yet approved or cleared by the [place]  FDA and has been authorized for detection and/or diagnosis of SARS-CoV-2 by FDA under an Emergency Use Authorization (EUA). This EUA will remain in effect (meaning this test can be used) for the duration of the COVID-19 declaration under Section 564(b)(1) of the Act, 21 U.S.C. section 360bbb-3(b)(1), unless the authorization is terminated or revoked.  Performed at [facility], [address] [place], [name] [address]   Culture, blood (Routine X 2) w Reflex to ID Panel     Status: None   Collection Time: 12/29/23  9:26 AM   Specimen: BLOOD RIGHT HAND  Result Value Ref Range Status   Specimen Description BLOOD RIGHT HAND  Final   Special Requests   Final    BOTTLES DRAWN AEROBIC AND ANAEROBIC Blood Culture adequate volume   Culture   Final    NO GROWTH 5 DAYS Performed at [facility], [address], [place], [name] [address]    Report Status 01/03/2024 FINAL  Final  Culture, blood (Routine X 2) w Reflex to ID Panel     Status: Abnormal   Collection Time: 12/29/23  9:40 AM   Specimen: BLOOD LEFT HAND  Result Value Ref Range Status   Specimen Description BLOOD LEFT HAND  Final   Special Requests   Final    BOTTLES DRAWN AEROBIC AND ANAEROBIC Blood Culture adequate volume   Culture  Setup Time   Final    GRAM POSITIVE COCCI AEROBIC BOTTLE ONLY CRITICAL RESULT CALLED TO, READ BACK BY AND VERIFIED WITH: PHARMD  V BRYK 12/30/2023 @ 2330 BY AB    Culture (A)  Final    STAPHYLOCOCCUS EPIDERMIDIS THE SIGNIFICANCE OF ISOLATING THIS ORGANISM FROM A SINGLE SET OF  BLOOD CULTURES WHEN MULTIPLE SETS ARE DRAWN IS UNCERTAIN. PLEASE NOTIFY THE MICROBIOLOGY DEPARTMENT WITHIN ONE WEEK IF SPECIATION AND SENSITIVITIES ARE REQUIRED. Performed at [facility], [address] [place], [name] [address]    Report Status 01/01/2024 FINAL  Final  Blood Culture ID Panel (Reflexed)     Status: Abnormal   Collection Time: 12/29/23  9:40 AM  Result Value Ref Range Status   Enterococcus faecalis NOT DETECTED NOT DETECTED Final   Enterococcus Faecium NOT DETECTED NOT DETECTED Final   Listeria monocytogenes NOT DETECTED NOT DETECTED Final   Staphylococcus species DETECTED (A) NOT DETECTED Final    Comment: CRITICAL RESULT CALLED TO, READ BACK BY AND VERIFIED WITH: PHARMD V BRYK 12/30/2023 @ 2330 BY AB    Staphylococcus aureus (BCID) NOT DETECTED NOT DETECTED Final   Staphylococcus epidermidis DETECTED (A) NOT DETECTED Final    Comment: Methicillin (oxacillin) resistant coagulase negative staphylococcus. Possible blood culture contaminant (unless isolated from more than one blood culture draw or clinical case suggests pathogenicity). No antibiotic treatment is indicated for blood  culture contaminants. CRITICAL RESULT CALLED TO, READ BACK BY AND VERIFIED WITH: PHARMD V BRYK 12/30/2023 @ 2330 BY AB    Staphylococcus lugdunensis NOT DETECTED NOT DETECTED Final   Streptococcus species NOT DETECTED NOT DETECTED Final   Streptococcus agalactiae NOT DETECTED NOT DETECTED Final   Streptococcus pneumoniae NOT DETECTED NOT DETECTED Final   Streptococcus pyogenes NOT DETECTED NOT DETECTED Final   A.calcoaceticus-baumannii NOT DETECTED NOT DETECTED Final   Bacteroides fragilis NOT DETECTED NOT DETECTED Final   Enterobacterales NOT DETECTED NOT DETECTED Final   Enterobacter cloacae complex NOT DETECTED NOT DETECTED Final    Escherichia coli NOT DETECTED NOT DETECTED Final   Klebsiella aerogenes NOT DETECTED NOT DETECTED Final   Klebsiella oxytoca NOT DETECTED NOT DETECTED Final   Klebsiella pneumoniae NOT DETECTED NOT DETECTED Final   Proteus species NOT DETECTED NOT DETECTED Final   Salmonella species NOT DETECTED NOT DETECTED Final   Serratia marcescens NOT DETECTED NOT DETECTED Final   Haemophilus influenzae NOT DETECTED NOT DETECTED Final   Neisseria meningitidis NOT DETECTED NOT DETECTED Final   Pseudomonas aeruginosa NOT DETECTED NOT DETECTED Final   Stenotrophomonas maltophilia NOT DETECTED NOT DETECTED Final   Candida albicans NOT DETECTED NOT DETECTED Final   Candida auris NOT DETECTED NOT DETECTED Final   Candida glabrata NOT DETECTED NOT DETECTED Final   Candida krusei NOT DETECTED NOT DETECTED Final   Candida parapsilosis NOT DETECTED NOT DETECTED Final   Candida tropicalis NOT DETECTED NOT DETECTED Final   Cryptococcus neoformans/gattii NOT DETECTED NOT DETECTED Final   Methicillin resistance mecA/C DETECTED (A) NOT DETECTED Final    Comment: CRITICAL RESULT CALLED TO, READ BACK BY AND VERIFIED WITH: PHARMD V BRYK 12/30/2023 @ 2330 BY AB Performed at [facility] Lab, [address], [place], [name] [address]   MRSA Next Gen by PCR, Nasal     Status: None   Collection Time: 12/29/23 12:45 PM   Specimen: Nasal Mucosa; Nasal Swab  Result Value Ref Range Status   MRSA by PCR Next Gen NOT DETECTED NOT DETECTED Final    Comment: (NOTE) The GeneXpert MRSA Assay (FDA approved for NASAL specimens only), is one component of a comprehensive MRSA colonization surveillance program. It is not intended to diagnose MRSA infection nor to guide or monitor treatment for MRSA infections. Test performance is not FDA approved in patients less than 66 years old. Performed at [facility]  Lab, [address] [place], [name] [address]   Culture, blood (Routine X 2) w Reflex to ID Panel     Status: None    Collection Time: 12/31/23  2:17 AM   Specimen: BLOOD RIGHT ARM  Result Value Ref Range Status   Specimen Description BLOOD RIGHT ARM  Final   Special Requests   Final    BOTTLES DRAWN AEROBIC AND ANAEROBIC Blood Culture adequate volume   Culture   Final    NO GROWTH 5 DAYS Performed at [facility], [address] [place], [name] [address]    Report Status 01/05/2024 FINAL  Final  Culture, blood (Routine X 2) w Reflex to ID Panel     Status: None   Collection Time: 12/31/23  2:17 AM   Specimen: BLOOD RIGHT HAND  Result Value Ref Range Status   Specimen Description BLOOD RIGHT HAND  Final   Special Requests   Final    BOTTLES DRAWN AEROBIC AND ANAEROBIC Blood Culture adequate volume   Culture   Final    NO GROWTH 5 DAYS Performed at [facility], [address] [place], [name] [address]    Report Status 01/05/2024 FINAL  Final  Culture, blood (Routine X 2) w Reflex to ID Panel     Status: None (Preliminary result)   Collection Time: 01/02/24  9:34 AM   Specimen: BLOOD  Result Value Ref Range Status   Specimen Description BLOOD SITE NOT SPECIFIED  Final   Special Requests   Final    BOTTLES DRAWN AEROBIC AND ANAEROBIC Blood Culture adequate volume   Culture   Final    NO GROWTH 3 DAYS Performed at [facility], [address], [place], [name] [address]    Report Status PENDING  Incomplete  Culture, blood (Routine X 2) w Reflex to ID Panel     Status: Abnormal (Preliminary result)   Collection Time: 01/02/24  9:34 AM   Specimen: BLOOD  Result Value Ref Range Status   Specimen Description BLOOD SITE NOT SPECIFIED  Final   Special Requests   Final    BOTTLES DRAWN AEROBIC AND ANAEROBIC Blood Culture adequate volume   Culture  Setup Time   Final    BUDDING YEAST SEEN IN BOTH AEROBIC AND ANAEROBIC BOTTLES CRITICAL RESULT CALLED TO, READ BACK BY AND VERIFIED WITH: PHARMD E.SINCLAIR AT 0831 ON 01/04/2024 BY T.SAAD.    Culture (A)  Final    CANDIDA  GLABRATA CULTURE REINCUBATED FOR BETTER GROWTH Performed at [facility], [address], [place], [name] [address]    Report Status PENDING  Incomplete  Blood Culture ID Panel (Reflexed)     Status: Abnormal   Collection Time: 01/02/24  9:34 AM  Result Value Ref Range Status   Enterococcus faecalis NOT DETECTED NOT DETECTED Final   Enterococcus Faecium NOT DETECTED NOT DETECTED Final   Listeria monocytogenes NOT DETECTED NOT DETECTED Final   Staphylococcus species NOT DETECTED NOT DETECTED Final   Staphylococcus aureus (BCID) NOT DETECTED NOT DETECTED Final   Staphylococcus epidermidis NOT DETECTED NOT DETECTED Final   Staphylococcus lugdunensis NOT DETECTED NOT DETECTED Final   Streptococcus species NOT DETECTED NOT DETECTED Final   Streptococcus agalactiae NOT DETECTED NOT DETECTED Final   Streptococcus pneumoniae NOT DETECTED NOT DETECTED Final   Streptococcus pyogenes NOT DETECTED NOT DETECTED Final   A.calcoaceticus-baumannii NOT DETECTED NOT DETECTED Final   Bacteroides fragilis NOT DETECTED NOT DETECTED Final   Enterobacterales  NOT DETECTED NOT DETECTED Final   Enterobacter cloacae complex NOT DETECTED NOT DETECTED Final   Escherichia coli NOT DETECTED NOT DETECTED Final   Klebsiella aerogenes NOT DETECTED NOT DETECTED Final   Klebsiella oxytoca NOT DETECTED NOT DETECTED Final   Klebsiella pneumoniae NOT DETECTED NOT DETECTED Final   Proteus species NOT DETECTED NOT DETECTED Final   Salmonella species NOT DETECTED NOT DETECTED Final   Serratia marcescens NOT DETECTED NOT DETECTED Final   Haemophilus influenzae NOT DETECTED NOT DETECTED Final   Neisseria meningitidis NOT DETECTED NOT DETECTED Final   Pseudomonas aeruginosa NOT DETECTED NOT DETECTED Final   Stenotrophomonas maltophilia NOT DETECTED NOT DETECTED Final   Candida albicans NOT DETECTED NOT DETECTED Final   Candida auris NOT DETECTED NOT DETECTED Final   Candida glabrata DETECTED (A) NOT DETECTED Final     Comment: CRITICAL RESULT CALLED TO, READ BACK BY AND VERIFIED WITH: PHARMD E.SINCLAIR AT 0831 ON 01/04/2024 BY T.SAAD.    Candida krusei NOT DETECTED NOT DETECTED Final   Candida parapsilosis NOT DETECTED NOT DETECTED Final   Candida tropicalis NOT DETECTED NOT DETECTED Final   Cryptococcus neoformans/gattii NOT DETECTED NOT DETECTED Final    Comment: Performed at [facility], [address], [place], [name] [address]     Serology:   Imaging: If present, new imagings (plain films, ct scans, and mri) have been personally visualized and interpreted; radiology reports have been reviewed. Decision making incorporated into the Impression / Recommendations.  9/2 cxr FINDINGS: The cardiomediastinal silhouette is unchanged in contour.RIGHT neck CVC with tip terminating over the expected area of the superior cavoatrial junction. Atherosclerotic calcifications. No pleural effusion. No pneumothorax. No acute pleuroparenchymal abnormality.   IMPRESSION: No acute cardiopulmonary abnormality.   9/3 renal u/s 1. Moderate bilateral hydronephrosis.  No shadowing stone. 2. Debris within the left renal collecting system likely proteinaceous content and suggestive of pyelonephritis. Correlation with urinalysis recommended. 3. Thickened bladder wall. Complex debris within the bladder likely proteinaceous or hemorrhagic content. Clinical correlation is recommended.     8/29 tte 1. Heavy smoke noted in the LV apex which is suggestive of soft thrombus  and very low flow (image 52). Paired LV mid-ventricular false tendons  (normal variant). Left ventricular ejection fraction, by estimation, is  <20%. Left ventricular ejection  fraction by PLAX is 13 %. The left ventricle has severely decreased  function. The left ventricle demonstrates global hypokinesis. Left  ventricular diastolic parameters are indeterminate.   2. Right ventricular systolic function is moderately reduced. The right   ventricular size is normal. There is normal pulmonary artery systolic  pressure. The estimated right ventricular systolic pressure is 29.0 mmHg.   3. Left atrial size was moderately dilated.   4. The mitral valve is grossly normal. Mild mitral valve regurgitation.   5. The aortic valve is tricuspid. Aortic valve regurgitation is not  visualized. No aortic stenosis is present.    Constance ONEIDA Passer, MD [facility] [phone] pager    01/05/2024, 3:22 PM

## 2024-01-05 NOTE — Progress Notes (Signed)
 Notified patient hypotensive with SBP upper 80's, lower 90's.     Alert and oriented with complaints of persistent nausea.  Abdominal exam benign, soft/nontender/nondistended.    250 ml LR bolus given with no improvement.  Low dose peripheral levo started.  Recent CBC with stable hgb.  Will trend tonight KUB and lipase pending.  Continues to have output from [facility].

## 2024-01-05 NOTE — Progress Notes (Signed)
 PHARMACY - ANTICOAGULATION CONSULT NOTE  Pharmacy Consult:  Heparin  Indication: LV thrombus  Allergies  Allergen Reactions   Cefepime  Other (See Comments)    Cefepime  neurotoxicity 11/07/23    Patient Measurements: Height: 6' (182.9 cm) Weight: 59.8 kg (131 lb 13.4 oz) IBW/kg (Calculated) : 77.6 HEPARIN  DW (KG): 42.1  Vital Signs: Temp: 97.6 F (36.4 C) (09/04 2310) Temp Source: Axillary (09/04 2310) BP: 116/70 (09/05 0500) Pulse Rate: 106 (09/05 0500)  Labs: Recent Labs    01/02/24 1509 01/02/24 1849 01/03/24 0500 01/03/24 1700 01/04/24 0224 01/04/24 0544 01/05/24 0248  HGB  --   --  9.8* 9.4* 10.5*  --  8.5*  HCT  --   --  29.8* 28.4* 31.9*  --  25.4*  PLT  --   --  209 201 222  --  198  APTT  --  82* 72*  --   --   --   --   HEPARINUNFRC   < >  --  0.47 0.37  --  0.39 0.49  CREATININE  --   --  5.60*  --  6.03*  --  6.66*   < > = values in this interval not displayed.    Estimated Creatinine Clearance: 9.5 mL/min (A) (by C-G formula based on SCr of 6.66 mg/dL (H)).   Assessment: 20 YOM with  LV thrombus off Eliquis  due to hematuria, last dose 8/29 AM.  CBC stable and Pharmacy consulted to dose IV heparin .  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  Confirmed weight of ~42 kg.   01/05/24: Heparin  level 0.49, borderline supra-therapeutic on heparin  1150 units/hr. Less hematuria noted this morning. No issues with infusion running per RN. Continuing heparin  for treatment of LV thrombus. CBC remains stable (Hgb 10.5, PLT 222). Last transfusion on 01/01/24.   Goal of Therapy:  Heparin  level 0.3-0.5 units/ml d/t bleeding risk Monitor platelets by anticoagulation protocol: Yes   Plan:  Decrease heparin  gtt to 1100 units/hr to target lower end of range Daily heparin  level and CBC Monitor for hematuria resolution  Morna Breach, PharmD PGY2 Cardiology Pharmacy  Resident 01/05/2024 6:49 AM

## 2024-01-05 NOTE — Progress Notes (Signed)
 Daily Progress Note   Patient Name: Derek Thompson       Date: 01/05/2024 DOB: 24-Jul-1960  Age: 63 y.o. MRN#: [id number] Attending Physician: Harold Scholz, MD Primary Care Physician: Elicia Sharper, DO Admit Date: 11/02/2023  Reason for Consultation/Follow-up: Establishing goals of care  Length of Stay: 61  Current Medications: Scheduled Meds:   acetaminophen   1,000 mg Oral Q6H   atorvastatin   80 mg Oral Daily   Chlorhexidine  Gluconate Cloth  6 each Topical Daily   dexamethasone  (DECADRON ) injection  20 mg Intravenous Weekly   ezetimibe   10 mg Oral Daily   feeding supplement  237 mL Oral TID BM   Gerhardt's butt cream   Topical BID   insulin  aspart  0-15 Units Subcutaneous Q4H   multivitamin  1 tablet Oral QHS   mupirocin  ointment   Nasal BID   thiamine   100 mg Oral Daily    Continuous Infusions:  cefTRIAXone  (ROCEPHIN )  IV Stopped (01/04/24 1557)   heparin  1,100 Units/hr (01/05/24 0920)   micafungin  (MYCAMINE ) 100 mg in sodium chloride  0.9 % 100 mL IVPB Stopped (01/04/24 1141)   sodium chloride  irrigation      PRN Meds: mouth rinse, polyethylene glycol, scopolamine   Physical Exam Vitals reviewed.  Constitutional:      General: He is not in acute distress.    Appearance: He is ill-appearing.  HENT:     Head: Normocephalic and atraumatic.  Cardiovascular:     Rate and Rhythm: Tachycardia present.  Pulmonary:     Effort: Tachypnea present.  Skin:    General: Skin is warm and dry.  Neurological:     Mental Status: He is alert.  Psychiatric:        Mood and Affect: Affect is flat.             Vital Signs: BP 115/69   Pulse (!) 130   Temp (!) 97.4 F (36.3 C) (Axillary)   Resp (!) 21   Ht 6' (1.829 m)   Wt 59.8 kg   SpO2 (!) 89%   BMI 17.88 kg/m  SpO2:  SpO2: (!) 89 % O2 Device: O2 Device: Room Air    Patient Active Problem List   Diagnosis Date Noted   Protein-calorie malnutrition, severe 01/04/2024   Moderate protein-calorie malnutrition (HCC) 12/02/2023   Hx of  right BKA (HCC) 11/27/2023   Monoclonal gammopathy 11/12/2023   Hypoalbuminemia 11/08/2023   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 (HCC) 11/04/2023   Chronic anemia 11/04/2023   Generalized weakness 11/03/2023   Sinus tachycardia 11/03/2023   HFrEF (heart failure with reduced ejection fraction) (HCC) 11/02/2023   Chronic osteomyelitis involving lower leg, right (HCC) 11/02/2023   Frailty 11/02/2023   Urinary retention 11/02/2023   Pyelonephritis and Cystitis 10/14/2023   Hyponatremia 10/12/2023   Hyperglycemia 10/12/2023   Tachycardia 10/11/2023   Screening for lung cancer 09/11/2023   Housing instability 09/11/2023   Gangrene of right foot (HCC) 09/02/2023   Acute thrombus of left ventricle (HCC) 06/19/2023   NSVT (nonsustained ventricular tachycardia) (HCC) 06/19/2023   Mixed hyperlipidemia 06/19/2023   Osteomyelitis of right foot (HCC) 06/15/2023   AKI (acute kidney injury) (HCC) 06/14/2023   Anemia 06/14/2023   Hyperkalemia 11/22/2022   Iron  deficiency anemia 08/27/2022   MDD (major depressive disorder) 08/27/2022   Malignant neoplasm of prostate (HCC)    CAD (coronary artery disease) 06/10/2020   Hypertension 06/10/2020   DM2 (diabetes mellitus, type 2) (HCC) 06/10/2020   Chronic systolic heart failure [facility])     Palliative Care Assessment & Plan   Patient Profile: 63 y.o. male  with past medical history of end-stage HFrEF, CKD stage 3b, prior CVA, chronic osteomyelitis of the right foot, prior history of prostate cancer s/p radiation, and bladder outlet obstruction with chronic Foley who presented to the ED on 11/02/23 with generalized weakness and was admitted with sepsis secondary to progressive right foot osteomyelitis and presumed  UTI, also with AKI.  In early August, patient was medically stable for discharge but did not have a safe disposition due to homelessness.  He was enrolled in the [facility].   Palliative Medicine was re-consulted on 9/3 for goals of care discussions.   LOS is 64 days.    Significant Hospital events: 7/4  - admitted 7/6 - initial palliative consult, goals clear for full code/full scope 7/17 -  bone marrow biopsy concerning for multiple myeloma 7/18 - status post Right BKA 8/27 - worsening renal function 8/29 - new fever, hematuria, AMS, and shock requiring pressor support, echocardiogram showing EF less than 20% and moderate RV dysfunction, started on dobutamine , transferred to ICU 9/3 - off pressors and dobutamine , undergoing CBI 9/4 runs of NSVT overnight, stable without pressors, BC + for candida, ID consult and micafungin      Today's Discussion: Reviewed chart and received updates from nursing and attending team. Patient lying in bed listening to jazz. There was an emesis bag at bedside. Patient appeared uncomfortable and shared his is exhausted. He reported worsening blood in his catheter this morning which he reported to nursing. When we discuss his multiple acute and chronic medical conditions including AKI on CKD, heart failure, and recent multiple myeloma diagnosis he shared he understands things are getting worse.   I shared my concern that his health is fragile and he is at high risk for further decline despite aggressive treatment efforts. Encouraged patient to consider his goals of care. Discussed code status. Patient states he would like give resuscitation a try. Shared my worry that resuscitation may be unsuccessful or may put him in a worse condition. Encouraged him to consider if he would want to remain on life support. Encouraged him to discuss his wishes with his HCPOA/mother. Offered to be present/help with discussions but he declined. He plans to discuss with her  today.  Patient was having a difficult time with conversation. He is open to follow up discussion tomorrow.  Discussed the importance of continued conversation with family and the medical providers regarding overall plan of care and treatment options, ensuring decisions are within the context of the patient's values and GOCs.  Encouraged patient to call PMT with questions or needs. PMT will continue to follow.  Recommendations/Plan: Continue full scope care Patient remains hopeful for medical stabilization and recovery to the extent this is possible Patient plans to discuss his medical wishes/goals with his mother HCPOA Ongoing palliative support    Code Status:    Code Status Orders  (From admission, onward)           Start     Ordered   11/02/23 1439  Full code  Continuous       Question:  By:  Answer:  Consent: discussion documented in EHR   11/02/23 1440         Extensive chart review has been completed prior to seeing the patient including labs, vital signs, imaging, progress/consult notes, orders, medications, and available advance directive documents.  Care plan was discussed with bedside RN and Dr. Harold  Time spent: 35 minutes  Thank you for allowing the Palliative Medicine Team to assist in the care of this patient.    Stephane CHRISTELLA Palin, NP  Please contact Palliative Medicine Team phone at [phone] for questions and concerns.

## 2024-01-05 NOTE — TOC Progression Note (Signed)
 Transition of Care [facility]) - Progression Note    Patient Details  Name: Derek Thompson MRN: [id number] Date of Birth: 05-04-1960  Transition of Care [facility]) CM/SW Contact  Isaiah Public, LCSWA Phone Number: 01/05/2024, 2:52 PM  Clinical Narrative:     PT following. Patient in [facility] program. Palliative following. TOC will continue to follow.   Expected Discharge Plan: Skilled Nursing Facility Barriers to Discharge: Insurance Authorization               Expected Discharge Plan and Services In-house Referral: Clinical Social Work   Post Acute Care Choice: Skilled Nursing Facility Living arrangements for the past 2 months: Homeless Shelter                                       Social Drivers of Health (SDOH) Interventions SDOH Screenings   Food Insecurity: No Food Insecurity (11/02/2023)  Housing: High Risk (11/02/2023)  Transportation Needs: Unmet Transportation Needs (11/02/2023)  Utilities: Not At Risk (11/02/2023)  Alcohol Screen: Low Risk  (08/19/2022)  Depression (PHQ2-9): Low Risk  (10/11/2023)  Financial Resource Strain: Low Risk  (08/19/2022)  Tobacco Use: Medium Risk (11/17/2023)    Readmission Risk Interventions     No data to display

## 2024-01-05 NOTE — Progress Notes (Signed)
 64 Days Post-Op Subjective: Complains of positional catheter drainage and clot obstruction overnight.  Objective: Vital signs in last 24 hours: Temp:  [97.4 F (36.3 C)-97.6 F (36.4 C)] 97.4 F (36.3 C) (09/05 0700) Pulse Rate:  [87-130] 130 (09/05 0830) Resp:  [12-23] 21 (09/05 0900) BP: (84-126)/(51-80) 115/69 (09/05 0900) SpO2:  [89 %-100 %] 89 % (09/05 0830) Weight:  [59.8 kg] 59.8 kg (09/05 0318)  Assessment/Plan: 63 year old male with Hx of grade 5 prostate cancer-s/p radiation and ADT in 2022, subsequently lost to follow-up.  EF below 20% with gangrene of left leg, s/p BKA.  # radiation cystitis # Hemorrhagic cystitis? # Gross hematuria # Clot obstruction of urine #AKI  Catheter exchanged 9//25 for clot obstruction.  Flowing well this morning.  Clear irrigant with CBI on low gtt.  Reviewed plan, expectations, and troubleshooting with nursing.  Not a candidate for clot evacuation at this time. Ongoing bleeding perpetuated by anticoagulation necessary for LV thrombus.  For now we will Continue bladder irrigation, hand irrigating as needed, serial H&H, and as needed transfusion.  Guarded prognosis.  Renal function continues to worsen.  Not a candidate for dialysis.  Likely multifactorial between prerenal damage caused by his ejection fraction below 15 and new diagnosis of multiple myeloma.  Continuing IV ABX and antifungals.    Continue serial H&H.  Transfuse for Hgb < 8.0.  Hand irrigate as needed  Urology will follow  Intake/Output from previous day: 09/04 0701 - 09/05 0700 In: 35256.8 [P.O.:240; I.V.:411.9; IV Piggyback:405] Out: 50349 [Urine:49650]  Intake/Output this shift: Total I/O In: 26.7 [I.V.:26.7] Out: 2200 [Urine:2200]  Physical Exam:  General: Alert and oriented CV: No cyanosis Lungs: equal chest rise Abdomen: Soft, NTND, no rebound or guarding Gu: Three-way hematuria catheter in place with CBI on low gtt.  Lab Results: Recent Labs     01/03/24 1700 01/04/24 0224 01/05/24 0248  HGB 9.4* 10.5* 8.5*  HCT 28.4* 31.9* 25.4*   BMET Recent Labs    01/04/24 0224 01/05/24 0248  NA 129* 125*  K 4.5 4.3  CL 93* 90*  CO2 18* 20*  GLUCOSE 197* 317*  BUN 114* 130*  CREATININE 6.03* 6.66*  CALCIUM  8.1* 8.2*  HGB 10.5* 8.5*  WBC 9.6 17.1*     Studies/Results: US  RENAL Result Date: 01/03/2024 CLINICAL DATA:  Hydronephrosis. EXAM: RENAL / URINARY TRACT ULTRASOUND COMPLETE COMPARISON:  Ultrasound dated 10/20/2023 and CT abdomen pelvis dated 12/29/2023. FINDINGS: Right Kidney: Renal measurements: 11.6 x 5.7 x 6.7 cm = volume: 230 mL. Diffuse increased renal parenchymal echogenicity. There is moderate right hydronephrosis. No shadowing stone. There is dilatation of the visualized proximal right ureter. Left Kidney: Renal measurements: 11.7 x 5.1 x 5.5 cm = volume: 170 mL. Increased renal parenchymal echogenicity. Moderate left hydronephrosis. No shadowing stone. Faint echogenic content within the renal collecting system likely proteinaceous debris. Bladder: The urinary bladder is decompressed around a Foley catheter. There is diffuse sequential thickening of the bladder wall concerning for cystitis. Echogenic debris surrounds the Foley catheter within the bladder lumen may represent proteinaceous content or blood clot. Other: None. IMPRESSION: 1. Moderate bilateral hydronephrosis.  No shadowing stone. 2. Debris within the left renal collecting system likely proteinaceous content and suggestive of pyelonephritis. Correlation with urinalysis recommended. 3. Thickened bladder wall. Complex debris within the bladder likely proteinaceous or hemorrhagic content. Clinical correlation is recommended. Electronically Signed   By: Vanetta Chou M.D.   On: 01/03/2024 19:52      LOS: 63 days  Ole Bourdon, NP [facility] Pager: [phone]  01/05/2024, 10:37 AM

## 2024-01-05 NOTE — Plan of Care (Signed)
  Problem: Fluid Volume: Goal: Ability to maintain a balanced intake and output will improve Outcome: Progressing   Problem: Health Behavior/Discharge Planning: Goal: Ability to identify and utilize available resources and services will improve Outcome: Progressing   Problem: Tissue Perfusion: Goal: Adequacy of tissue perfusion will improve Outcome: Progressing

## 2024-01-05 NOTE — Progress Notes (Addendum)
 eLink Physician-Brief Progress Note Patient Name: Derek Thompson DOB: 24-Jun-1960 MRN: [id number]   Date of Service  01/05/2024  HPI/Events of Note  Critical hemoglobin 6.8,  started requiring vasopressors today.  Blood loss with CBI earlier   eICU Interventions  Transfuse 1 unit PRBC.  Trend hemoglobin every 6 hours   0525 -   Patient is mentating about the same, blood pressure is unstable, attempted vagal maneuvers with no benefit.  5 mg IV push metoprolol  x 1 with appropriate benefit.  Labs for this morning are pending.  Intervention Category Intermediate Interventions: Bleeding - evaluation and treatment with blood products  Dayani Winbush 01/05/2024, 9:00 PM

## 2024-01-05 NOTE — Progress Notes (Signed)
 [facility] Progress Note    Assessment/ Plan:   AKI on CKD -suspect ATN at this point. Causes are likely related to recent shock/decrease renal perfusion, MGRS, obstruction. Received contrast on 8/29. repeat renal ultrasound still with hydronephrosis-urology following -Cr continues to rise, up to 6.66-yet to peak -no current indications for renal replacement therapy at this junction but seems that he is heading down this route. Discussed in detail with patient today. I shared  my concerns in regards to doing dialysis in him particularly his HFrEF, already low BP, low functional status (BKA) and now fungemic. I recommended against dialysis given his guarded prognosis which he is aware of. He will still think about this some more -at this junction, I recommend continuing supportive care -Avoid nephrotoxic medications including NSAIDs and iodinated intravenous contrast exposure unless the latter is absolutely indicated.  Preferred narcotic agents for pain control are hydromorphone , fentanyl , and methadone. Morphine  should not be used. Avoid Baclofen and avoid oral sodium phosphate  and magnesium  citrate based laxatives / bowel preps. Continue strict Input and Output monitoring. Will monitor the patient closely with you and intervene or adjust therapy as indicated by changes in clinical status/labs    Septic shock Fungemia Multifocal pneumonia -abx/micofungin per primary service -off pressors now. Keep MAP >65 to ensure adequate renal perfusion -ID following   Urinary retention Hematuria -urology following, undergoing CBI    LV thrombus -on heparin  drip   Multiple Myeloma -diagnosed via bone marrow biopsy. Plan to follow up with oncology OP. Given his acute/current issues along with functional status, I do not foresee him undergoing chemo at this time given his current comorbidities. On dexamethasone  weekly   Hyponatremia -likely secondary to pseudohyponatremia secondary to  monoclonal gammopathy and AKI. Na 125 this AM   Acidosis -secondary to AKI, trend for now, stable   Chronic osteomyelitis s/p right BKA -per primary service   Anemia -transfuse prn for Hgb <7 -hgb stable 10.5  DM2 with hyperglycemia -per primary   Recommendations conveyed to primary service.  Guarded prognosis.    Subjective:   Patient seen and examined. Just waking up but denies any chest pain, SOB, N/V, dysgeusia, loss of appetite. Started on dexamethasone  therefore hyperglycemic. Foley changed.   Objective:   BP 121/72   Pulse (!) 121   Temp (!) 97.4 F (36.3 C) (Axillary)   Resp (!) 22   Ht 6' (1.829 m)   Wt 59.8 kg   SpO2 100%   BMI 17.88 kg/m   Intake/Output Summary (Last 24 hours) at 01/05/2024 0854 Last data filed at 01/05/2024 0846 Gross per 24 hour  Intake 32007.98 ml  Output 50049 ml  Net -17942.02 ml   Weight change:   Physical Exam: Gen: NAD, chronically ill appearing, laying flat in bed CVS: rrr Resp: cta bl Abd: soft, nt/nd Ext: no edema, right BKA Neuro: awake, alert, no tremors  Imaging: US  RENAL Result Date: 01/03/2024 CLINICAL DATA:  Hydronephrosis. EXAM: RENAL / URINARY TRACT ULTRASOUND COMPLETE COMPARISON:  Ultrasound dated 10/20/2023 and CT abdomen pelvis dated 12/29/2023. FINDINGS: Right Kidney: Renal measurements: 11.6 x 5.7 x 6.7 cm = volume: 230 mL. Diffuse increased renal parenchymal echogenicity. There is moderate right hydronephrosis. No shadowing stone. There is dilatation of the visualized proximal right ureter. Left Kidney: Renal measurements: 11.7 x 5.1 x 5.5 cm = volume: 170 mL. Increased renal parenchymal echogenicity. Moderate left hydronephrosis. No shadowing stone. Faint echogenic content within the renal collecting system likely proteinaceous debris. Bladder: The urinary bladder  is decompressed around a Foley catheter. There is diffuse sequential thickening of the bladder wall concerning for cystitis. Echogenic debris surrounds  the Foley catheter within the bladder lumen may represent proteinaceous content or blood clot. Other: None. IMPRESSION: 1. Moderate bilateral hydronephrosis.  No shadowing stone. 2. Debris within the left renal collecting system likely proteinaceous content and suggestive of pyelonephritis. Correlation with urinalysis recommended. 3. Thickened bladder wall. Complex debris within the bladder likely proteinaceous or hemorrhagic content. Clinical correlation is recommended. Electronically Signed   By: Vanetta Chou M.D.   On: 01/03/2024 19:52    Labs: BMET Recent Labs  Lab 12/31/23 0400 01/01/24 0457 01/02/24 0632 01/02/24 1509 01/03/24 0500 01/04/24 0224 01/05/24 0248  NA 123* 126* 129* 127* 125* 129* 125*  K 4.1 4.1 4.3 4.2 4.5 4.5 4.3  CL 90* 91* 92* 93* 93* 93* 90*  CO2 21* 22 21* 19* 17* 18* 20*  GLUCOSE 88 129* 110* 107* 81 197* 317*  BUN 127* 112* 100* 109* 106* 114* 130*  CREATININE 5.03* 5.02* 4.88* 5.05* 5.60* 6.03* 6.66*  CALCIUM  8.1* 8.3* 8.3* 8.0* 8.1* 8.1* 8.2*  PHOS  --  5.1* 4.6  --   --   --  6.1*   CBC Recent Labs  Lab 12/29/23 0926 12/29/23 1323 01/02/24 1509 01/03/24 0500 01/03/24 1700 01/04/24 0224 01/05/24 0248  WBC 18.0*   < > 14.6* 18.4* 13.6* 9.6 17.1*  NEUTROABS 16.7*  --  13.5*  --   --   --   --   HGB 8.0*   < > 10.2* 9.8* 9.4* 10.5* 8.5*  HCT 24.5*   < > 31.0* 29.8* 28.4* 31.9* 25.4*  MCV 84.2   < > 84.9 85.9 86.1 86.4 84.7  PLT 292   < > 251 209 201 222 198   < > = values in this interval not displayed.    Medications:     acetaminophen   1,000 mg Oral Q6H   atorvastatin   80 mg Oral Daily   Chlorhexidine  Gluconate Cloth  6 each Topical Daily   dexamethasone  (DECADRON ) injection  20 mg Intravenous Weekly   ezetimibe   10 mg Oral Daily   feeding supplement  237 mL Oral TID BM   Gerhardt's butt cream   Topical BID   insulin  aspart  0-15 Units Subcutaneous Q4H   mupirocin  ointment   Nasal BID   thiamine   100 mg Oral Daily      Ephriam Stank, MD [facility] 01/05/2024, 8:54 AM

## 2024-01-05 NOTE — Plan of Care (Signed)
  Problem: Activity: Goal: Risk for activity intolerance will decrease Outcome: Not Progressing   Problem: Education: Goal: Individualized Educational Video(s) Outcome: Not Progressing   Problem: Coping: Goal: Ability to adjust to condition or change in health will improve Outcome: Not Progressing   Problem: Fluid Volume: Goal: Ability to maintain a balanced intake and output will improve Outcome: Not Progressing   Problem: Health Behavior/Discharge Planning: Goal: Ability to identify and utilize available resources and services will improve Outcome: Not Progressing Goal: Ability to manage health-related needs will improve Outcome: Not Progressing   Problem: Metabolic: Goal: Ability to maintain appropriate glucose levels will improve Outcome: Progressing   Problem: Nutritional: Goal: Maintenance of adequate nutrition will improve Outcome: Not Progressing Goal: Progress toward achieving an optimal weight will improve Outcome: Not Progressing   Problem: Tissue Perfusion: Goal: Adequacy of tissue perfusion will improve Outcome: Not Progressing

## 2024-01-05 NOTE — Progress Notes (Signed)
 Occupational Therapy Treatment Patient Details Name: Derek Thompson MRN: [id number] DOB: 21-Oct-1960 Today's Date: 01/05/2024   History of present illness Pt is a 63 y/o M presenting to ED on 11/02/23 with lethargy. Pt found to be tachycardic, hyperkalemic, and hyponatremic. 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 Pt transferred to icu on 8/29. 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   OT comments  Patient received in supine and declined EOB due to fatigue but agreeable to bed level UE exercises. Patient attempted elbow flexion with level 2 therapy band but was too difficulty. Patient able to perform elbow flexion/extension exercises with level one therapy band with increased time between reps and AROM to BUE before patient asked to end session. Patient to continue to be followed by acute OT with [facility] program to address established goals.       If plan is discharge home, recommend the following:  A lot of help with walking and/or transfers;A lot of help with bathing/dressing/bathroom;Assistance with cooking/housework;Direct supervision/assist for medications management;Direct supervision/assist for financial management;Assist for transportation;Help with stairs or ramp for entrance   Equipment Recommendations  Other (comment) (defer)    Recommendations for Other Services      Precautions / Restrictions Precautions Precautions: Fall;Other (comment) Recall of Precautions/Restrictions: Impaired Precaution/Restrictions Comments: NG Tube, Bladder irrigation, central line R BKA- no pillows under  knee Required Braces or Orthoses: Other Brace Other Brace: prosthetist request use of limb protector in bed to keep knee extension at night Restrictions Weight Bearing Restrictions Per Provider Order: No RLE Weight Bearing Per Provider Order: Non weight bearing Other Position/Activity Restrictions: shrinker       Mobility Bed Mobility Overal bed mobility: Needs Assistance             General bed mobility comments: not addressed    Transfers                   General transfer comment: Patient declined EOB     Balance                                           ADL either performed or assessed with clinical judgement   ADL Overall ADL's : Needs assistance/impaired                                       General ADL Comments: Patient declined self care tasks    Extremity/Trunk Assessment              Vision       Perception     Praxis     Communication Communication Communication: Impaired Factors Affecting Communication: Difficulty expressing self   Cognition Arousal: Lethargic Behavior During Therapy: Flat affect Cognition: Cognition impaired             OT - Cognition Comments: increased fatigue with tasks and very flat  Following commands: Impaired Following commands impaired: Follows one step commands with increased time      Cueing   Cueing Techniques: Verbal cues, Gestural cues, Tactile cues  Exercises Exercises: General Upper Extremity General Exercises - Upper Extremity Shoulder Flexion: AROM, Both, 5 reps Elbow Flexion: Strengthening, Both, 10 reps, Supine, Theraband Theraband Level (Elbow Flexion): Level 1 (Yellow) Elbow Extension: Strengthening, Both, 5 reps, Supine, Theraband Theraband Level (Elbow Extension): Level 1 (Yellow)    Shoulder Instructions       General Comments Patient HR increased to 131 during UE exercises    Pertinent Vitals/ Pain       Pain  Assessment Pain Assessment: Faces Faces Pain Scale: Hurts even more Pain Location: bladder Pain Descriptors / Indicators: Grimacing, Discomfort Pain Intervention(s): Limited activity within patient's tolerance, Monitored during session  Home Living                                          Prior Functioning/Environment              Frequency  Min 5X/week        Progress Toward Goals  OT Goals(current goals can now be found in the care plan section)  Progress towards OT goals: Progressing toward goals (limited progress)  Acute Rehab OT Goals Patient Stated Goal: to feel better OT Goal Formulation: With patient Time For Goal Achievement: 02/13/24 Potential to Achieve Goals: Fair ADL Goals Pt Will Perform Grooming: with set-up;sitting Pt Will Perform Lower Body Bathing: with min assist;sitting/lateral leans Pt Will Perform Upper Body Dressing: with set-up;sitting Pt Will Perform Lower Body Dressing: with min assist;sitting/lateral leans Pt Will Transfer to Toilet: with mod assist;stand pivot transfer;bedside commode Pt Will Perform Toileting - Clothing Manipulation and hygiene: with min assist;sitting/lateral leans Pt/caregiver will Perform Home Exercise Program: Increased strength;Both right and left upper extremity;With theraband;With theraputty;Independently;With written HEP provided Additional ADL Goal #1: Pt will be able to follow 1 step commands with >50% accuracy to maximize participation in ADLs  Plan      Co-evaluation                 AM-PAC OT 6 Clicks Daily Activity     Outcome Measure   Help from another person eating meals?: None Help from another person taking care of personal grooming?: A Little Help from another person toileting, which includes using toliet, bedpan, or urinal?: Total Help from another person bathing (including washing, rinsing, drying)?: A Lot Help from another person to put on and taking off regular upper  body clothing?: A Little Help from another person to put on and taking off regular lower body clothing?: Total 6 Click Score: 14    End of Session    OT Visit Diagnosis: Unsteadiness on feet (R26.81);Other abnormalities of gait and mobility (R26.89);Muscle weakness (generalized) (M62.81);Other symptoms and signs involving cognitive function   Activity Tolerance Patient limited by fatigue   Patient Left in bed;with call bell/phone within reach;with bed alarm set   Nurse Communication Mobility status        Time: 1140-1155 OT Time Calculation (min): 15 min  Charges: OT General Charges $OT Visit: 1 Visit OT Treatments $Therapeutic Exercise: 8-22 mins  Dick Laine, OTA Acute Rehabilitation Services  Office [phone]   Jeb LITTIE Laine 01/05/2024, 1:00 PM

## 2024-01-06 ENCOUNTER — Inpatient Hospital Stay (HOSPITAL_COMMUNITY): Payer: MEDICAID

## 2024-01-06 DIAGNOSIS — Z7189 Other specified counseling: Secondary | ICD-10-CM | POA: Diagnosis not present

## 2024-01-06 DIAGNOSIS — E43 Unspecified severe protein-calorie malnutrition: Secondary | ICD-10-CM

## 2024-01-06 DIAGNOSIS — I5023 Acute on chronic systolic (congestive) heart failure: Secondary | ICD-10-CM | POA: Diagnosis not present

## 2024-01-06 DIAGNOSIS — A419 Sepsis, unspecified organism: Secondary | ICD-10-CM | POA: Diagnosis not present

## 2024-01-06 DIAGNOSIS — J189 Pneumonia, unspecified organism: Secondary | ICD-10-CM

## 2024-01-06 DIAGNOSIS — D62 Acute posthemorrhagic anemia: Secondary | ICD-10-CM

## 2024-01-06 DIAGNOSIS — E8721 Acute metabolic acidosis: Secondary | ICD-10-CM

## 2024-01-06 DIAGNOSIS — Z515 Encounter for palliative care: Secondary | ICD-10-CM | POA: Diagnosis not present

## 2024-01-06 DIAGNOSIS — B377 Candidal sepsis: Secondary | ICD-10-CM | POA: Diagnosis not present

## 2024-01-06 DIAGNOSIS — R6521 Severe sepsis with septic shock: Secondary | ICD-10-CM | POA: Diagnosis not present

## 2024-01-06 DIAGNOSIS — B49 Unspecified mycosis: Secondary | ICD-10-CM | POA: Diagnosis not present

## 2024-01-06 LAB — HEPATIC FUNCTION PANEL
ALT: 114 U/L — ABNORMAL HIGH (ref 0–44)
AST: 27 U/L (ref 15–41)
Albumin: 1.8 g/dL — ABNORMAL LOW (ref 3.5–5.0)
Alkaline Phosphatase: 63 U/L (ref 38–126)
Bilirubin, Direct: <0.1 mg/dL (ref 0.0–0.2)
Total Bilirubin: 0.8 mg/dL (ref 0.0–1.2)
Total Protein: 6.8 g/dL (ref 6.5–8.1)

## 2024-01-06 LAB — BASIC METABOLIC PANEL WITH GFR
Anion gap: 17 — ABNORMAL HIGH (ref 5–15)
BUN: 139 mg/dL — ABNORMAL HIGH (ref 8–23)
CO2: 21 mmol/L — ABNORMAL LOW (ref 22–32)
Calcium: 8.3 mg/dL — ABNORMAL LOW (ref 8.9–10.3)
Chloride: 86 mmol/L — ABNORMAL LOW (ref 98–111)
Creatinine, Ser: 7.4 mg/dL — ABNORMAL HIGH (ref 0.61–1.24)
GFR, Estimated: 8 mL/min — ABNORMAL LOW (ref >60)
Glucose, Bld: 171 mg/dL — ABNORMAL HIGH (ref 70–99)
Potassium: 5.1 mmol/L (ref 3.5–5.1)
Sodium: 124 mmol/L — ABNORMAL LOW (ref 135–145)

## 2024-01-06 LAB — GLUCOSE, CAPILLARY
Glucose-Capillary: 182 mg/dL — ABNORMAL HIGH (ref 70–99)
Glucose-Capillary: 197 mg/dL — ABNORMAL HIGH (ref 70–99)
Glucose-Capillary: 221 mg/dL — ABNORMAL HIGH (ref 70–99)
Glucose-Capillary: 185 mg/dL — ABNORMAL HIGH (ref 70–99)
Glucose-Capillary: 231 mg/dL — ABNORMAL HIGH (ref 70–99)
Glucose-Capillary: 76 mg/dL (ref 70–99)

## 2024-01-06 LAB — CBC
HCT: 26 % — ABNORMAL LOW (ref 39.0–52.0)
Hemoglobin: 9 g/dL — ABNORMAL LOW (ref 13.0–17.0)
MCH: 29.7 pg (ref 26.0–34.0)
MCHC: 34.6 g/dL (ref 30.0–36.0)
MCV: 85.8 fL (ref 80.0–100.0)
Platelets: 177 K/uL (ref 150–400)
RBC: 3.03 MIL/uL — ABNORMAL LOW (ref 4.22–5.81)
RDW: 16.8 % — ABNORMAL HIGH (ref 11.5–15.5)
WBC: 17.8 K/uL — ABNORMAL HIGH (ref 4.0–10.5)
nRBC: 0 % (ref 0.0–0.2)

## 2024-01-06 LAB — HEPARIN LEVEL (UNFRACTIONATED): Heparin Unfractionated: 0.32 [IU]/mL (ref 0.30–0.70)

## 2024-01-06 LAB — HEMOGLOBIN AND HEMATOCRIT, BLOOD
HCT: 21.4 % — ABNORMAL LOW (ref 39.0–52.0)
HCT: 21.9 % — ABNORMAL LOW (ref 39.0–52.0)
HCT: 23.2 % — ABNORMAL LOW (ref 39.0–52.0)
Hemoglobin: 7.2 g/dL — ABNORMAL LOW (ref 13.0–17.0)
Hemoglobin: 7.5 g/dL — ABNORMAL LOW (ref 13.0–17.0)
Hemoglobin: 7.9 g/dL — ABNORMAL LOW (ref 13.0–17.0)

## 2024-01-06 MED ORDER — METOCLOPRAMIDE HCL 5 MG/ML IJ SOLN
5.0000 mg | Freq: Four times a day (QID) | INTRAMUSCULAR | Status: DC
  Administered 2024-01-06 – 2024-01-07 (×4): 5 mg via INTRAVENOUS
  Filled 2024-01-06 (×4): qty 2

## 2024-01-06 MED ORDER — METOPROLOL TARTRATE 5 MG/5ML IV SOLN
5.0000 mg | INTRAVENOUS | Status: DC | PRN
  Administered 2024-01-06: 5 mg via INTRAVENOUS

## 2024-01-06 MED ORDER — METOPROLOL TARTRATE 5 MG/5ML IV SOLN
INTRAVENOUS | Status: AC
  Filled 2024-01-06: qty 5

## 2024-01-06 MED ORDER — PHENYLEPHRINE 80 MCG/ML (10ML) SYRINGE FOR IV PUSH (FOR BLOOD PRESSURE SUPPORT)
PREFILLED_SYRINGE | INTRAVENOUS | Status: AC
  Filled 2024-01-06: qty 10

## 2024-01-06 MED ORDER — LACTATED RINGERS IV BOLUS: 250.0000 mL | Freq: Once | INTRAVENOUS | Status: DC

## 2024-01-06 NOTE — Progress Notes (Signed)
 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 Patient is without complaint.  He was standing up with nursing on rounds.  CBI at a slow to moderate drip light pink.  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)   Chronic anemia   Hypoalbuminemia   Monoclonal gammopathy   Hx of right BKA (HCC)   Moderate protein-calorie malnutrition (HCC)   Protein-calorie malnutrition, severe   Fungemia  Current Facility-Administered Medications  Medication Dose Route Frequency Provider Last Rate Last Admin   0.9 %  sodium chloride  infusion (Manually program via Guardrails IV Fluids)   Intravenous Once Paliwal, Aditya, MD       acetaminophen  (TYLENOL ) tablet 1,000 mg  1,000 mg Oral Q6H McLendon, Michael, MD   1,000 mg at 01/06/24 1518   atorvastatin  (LIPITOR ) tablet 80 mg  80 mg Oral Daily Byrum, Robert S, MD   80 mg at 01/06/24 0904   cefTRIAXone  (ROCEPHIN ) 2 g in sodium chloride  0.9 % 100 mL IVPB  2 g Intravenous Q24H Vu, Trung T, MD 200 mL/hr at 01/06/24 1521 2 g at  01/06/24 1521   Chlorhexidine  Gluconate Cloth 2 % PADS 6 each  6 each Topical Daily Shelah Lamar RAMAN, MD   6 each at 01/06/24 9096   dexamethasone  (DECADRON ) injection 20 mg  20 mg Intravenous Weekly Gleason, Leita SAUNDERS, PA-C   20 mg at 01/03/24 1807   ezetimibe  (ZETIA ) tablet 10 mg  10 mg Oral Daily Byrum, Robert S, MD   10 mg at 01/06/24 0904   feeding supplement (ENSURE PLUS HIGH PROTEIN) liquid 237 mL  237 mL Oral TID BM Harold Scholz, MD   237 mL at 01/06/24 9094   Gerhardt's butt cream   Topical BID Harold Scholz, MD   Given at 01/06/24 0904   heparin  ADULT infusion 100 units/mL (25000 units/250mL)  1,100 Units/hr Intravenous Continuous Serena Morna SAUNDERS, RPH 11 mL/hr at 01/06/24 1523 1,100 Units/hr at 01/06/24 1523   insulin  aspart (novoLOG ) injection 0-15 Units  0-15 Units Subcutaneous Q4H Paliwal, Aditya, MD   5 Units at 01/06/24 1245   metoCLOPramide  (REGLAN ) injection 5 mg  5 mg Intravenous Q6H Chand, Sudham, MD   5 mg at 01/06/24 1231   metoprolol  tartrate (LOPRESSOR ) 5 MG/5ML injection            micafungin  (MYCAMINE ) 100 mg in sodium chloride  0.9 % 100 mL IVPB  100 mg Intravenous Daily Gleason, Leita SAUNDERS, PA-C 105 mL/hr at 01/06/24 1000 Infusion Verify at 01/06/24 1000   multivitamin (RENA-VIT) tablet 1 tablet  1 tablet Oral QHS Harold Scholz, MD  1 tablet at 01/05/24 2009   mupirocin  ointment (BACTROBAN ) 2 %   Nasal BID Shelah Lamar RAMAN, MD   1 Application at 01/06/24 9094   norepinephrine  (LEVOPHED ) 4mg  in (0.016 mg/mL) premix infusion  0-10 mcg/min Intravenous Titrated Hicks, Samantha B, NP   Stopped at 01/06/24 9295   Oral care mouth rinse  15 mL Mouth Rinse PRN Byrum, Robert S, MD       polyethylene glycol (MIRALAX  / GLYCOLAX ) packet 17 g  17 g Oral Daily PRN Shelah Lamar RAMAN, MD   17 g at 12/29/23 0000   scopolamine  (TRANSDERM-SCOP) 1 MG/3DAYS 1 mg  1 patch Transdermal Q72H PRN Gleason, Laura R, PA-C   1 mg at 01/05/24 0350   sodium chloride  irrigation 0.9 % 3,000 mL  3,000 mL  Irrigation Continuous Sattenfield, Cameron W, NP   3,000 mL at 01/06/24 1145   thiamine  (VITAMIN B1) tablet 100 mg  100 mg Oral Daily Harold Scholz, MD   100 mg at 01/06/24 0904     Objective: Vital: Vitals:   01/06/24 1201 01/06/24 1300 01/06/24 1400 01/06/24 1515  BP:  108/65 95/67 99/77   Pulse:  (!) 115 (!) 107 (!) 109  Resp:  (!) 22 (!) 9 16  Temp: 97.8 F (36.6 C)     TempSrc: Oral     SpO2:  99% 99%   Weight:      Height:       I/Os: I/O last 3 completed shifts: In: 21380.6 [I.V.:530.8; Blood:426; Other:19200; IV Piggyback:1223.8] Out: 74764 [Urine:25235]  Physical Exam:  General: Patient is in no apparent distress Lungs: Normal respiratory effort, chest expands symmetrically. GI: The abdomen is soft and nontender without mass. Foley: Three-way Foley catheter with slow to moderate CBI draining light pink Ext: lower extremities symmetric  Lab Results: Recent Labs    01/05/24 1044 01/05/24 1841 01/06/24 0504 01/06/24 1330  WBC 32.3* 26.9* 17.8*  --   HGB 8.7* 6.8* 9.0* 7.2*  HCT 26.6* 20.3* 26.0* 21.4*   Recent Labs    01/04/24 0224 01/05/24 0248 01/06/24 0504  NA 129* 125* 124*  K 4.5 4.3 5.1  CL 93* 90* 86*  CO2 18* 20* 21*  GLUCOSE 197* 317* 171*  BUN 114* 130* 139*  CREATININE 6.03* 6.66* 7.40*  CALCIUM  8.1* 8.2* 8.3*   No results for input(s): LABPT, INR in the last 72 hours. No results for input(s): LABURIN in the last 72 hours. Results for orders placed or performed during the hospital encounter of 11/02/23  Culture, OB Urine     Status: None   Collection Time: 11/03/23  8:11 AM   Specimen: Urine, Random  Result Value Ref Range Status   Specimen Description URINE, RANDOM  Final   Special Requests NONE  Final   Culture   Final    NO GROWTH NO GROUP B STREP (S.AGALACTIAE) ISOLATED Performed at [facility], [address], [place], [name] [address]    Report Status 11/04/2023 FINAL  Final  Culture, blood (Routine X 2) w  Reflex to ID Panel     Status: None   Collection Time: 11/03/23 10:23 PM   Specimen: BLOOD RIGHT HAND  Result Value Ref Range Status   Specimen Description BLOOD RIGHT HAND  Final   Special Requests   Final    BOTTLES DRAWN AEROBIC AND ANAEROBIC Blood Culture adequate volume   Culture   Final    NO GROWTH 5 DAYS Performed at [facility], [address] [place], [name] [address]    Report Status 11/08/2023 FINAL  Final  Culture, blood (Routine X 2) w Reflex to ID Panel     Status: None   Collection Time: 11/03/23 10:25 PM   Specimen: BLOOD RIGHT HAND  Result Value Ref Range Status   Specimen Description BLOOD RIGHT HAND  Final   Special Requests   Final    BOTTLES DRAWN AEROBIC AND ANAEROBIC Blood Culture adequate volume   Culture   Final    NO GROWTH 5 DAYS Performed at [facility], [address], [place], [name] [address]    Report Status 11/08/2023 FINAL  Final  Resp panel by RT-PCR (RSV, Flu A&B, Covid) Anterior Nasal Swab     Status: None   Collection Time: 11/06/23  7:13 AM   Specimen: Anterior Nasal Swab  Result Value Ref Range Status   SARS Coronavirus 2 by RT PCR NEGATIVE NEGATIVE Final   Influenza A by PCR NEGATIVE NEGATIVE Final   Influenza B by PCR NEGATIVE NEGATIVE Final    Comment: (NOTE) The Xpert Xpress SARS-CoV-2/FLU/RSV plus assay is intended as an aid in the diagnosis of influenza from Nasopharyngeal swab specimens and should not be used as a sole basis for treatment. Nasal washings and aspirates are unacceptable for Xpert Xpress SARS-CoV-2/FLU/RSV testing.  Fact Sheet for Patients: BloggerCourse.com  Fact Sheet for Healthcare Providers: SeriousBroker.it  This test is not yet approved or cleared by the [place]  FDA and has been authorized for detection and/or diagnosis of SARS-CoV-2 by FDA under an Emergency Use Authorization (EUA). This EUA will remain in effect (meaning this test  can be used) for the duration of the COVID-19 declaration under Section 564(b)(1) of the Act, 21 U.S.C. section 360bbb-3(b)(1), unless the authorization is terminated or revoked.     Resp Syncytial Virus by PCR NEGATIVE NEGATIVE Final    Comment: (NOTE) Fact Sheet for Patients: BloggerCourse.com  Fact Sheet for Healthcare Providers: SeriousBroker.it  This test is not yet approved or cleared by the [place]  FDA and has been authorized for detection and/or diagnosis of SARS-CoV-2 by FDA under an Emergency Use Authorization (EUA). This EUA will remain in effect (meaning this test can be used) for the duration of the COVID-19 declaration under Section 564(b)(1) of the Act, 21 U.S.C. section 360bbb-3(b)(1), unless the authorization is terminated or revoked.  Performed at [facility], [address], [place], [name] [address]   Respiratory (~20 pathogens) panel by PCR     Status: None   Collection Time: 11/06/23  7:13 AM   Specimen: Nasopharyngeal Swab; Respiratory  Result Value Ref Range Status   Adenovirus NOT DETECTED NOT DETECTED Final   Coronavirus 229E NOT DETECTED NOT DETECTED Final    Comment: (NOTE) The Coronavirus on the Respiratory Panel, DOES NOT test for the novel  Coronavirus (2019 nCoV)    Coronavirus HKU1 NOT DETECTED NOT DETECTED Final   Coronavirus NL63 NOT DETECTED NOT DETECTED Final   Coronavirus OC43 NOT DETECTED NOT DETECTED Final   Metapneumovirus NOT DETECTED NOT DETECTED Final   Rhinovirus / Enterovirus NOT DETECTED NOT DETECTED Final   Influenza A NOT DETECTED NOT DETECTED Final   Influenza B NOT DETECTED NOT DETECTED Final   Parainfluenza Virus 1 NOT DETECTED NOT DETECTED Final   Parainfluenza Virus 2 NOT DETECTED NOT DETECTED Final   Parainfluenza Virus 3 NOT DETECTED NOT DETECTED Final   Parainfluenza Virus 4 NOT DETECTED NOT DETECTED Final   Respiratory  Syncytial Virus NOT DETECTED NOT  DETECTED Final   Bordetella pertussis NOT DETECTED NOT DETECTED Final   Bordetella Parapertussis NOT DETECTED NOT DETECTED Final   Chlamydophila pneumoniae NOT DETECTED NOT DETECTED Final   Mycoplasma pneumoniae NOT DETECTED NOT DETECTED Final    Comment: Performed at [facility], [address], [place], [name] [address]  MRSA Next Gen by PCR, Nasal     Status: Abnormal   Collection Time: 11/06/23  8:53 AM   Specimen: Nasal Mucosa; Nasal Swab  Result Value Ref Range Status   MRSA by PCR Next Gen DETECTED (A) NOT DETECTED Final    Comment: RESULT CALLED TO, READ BACK BY AND VERIFIED WITH: RN DEE FRANCKS ON 11/06/23 @ 1220 BY DRT (NOTE) The GeneXpert MRSA Assay (FDA approved for NASAL specimens only), is one component of a comprehensive MRSA colonization surveillance program. It is not intended to diagnose MRSA infection nor to guide or monitor treatment for MRSA infections. Test performance is not FDA approved in patients less than 98 years old. Performed at [facility], [address], [place], [name] [address]   Culture, blood (Routine X 2) w Reflex to ID Panel     Status: None   Collection Time: 11/21/23 12:35 PM   Specimen: BLOOD  Result Value Ref Range Status   Specimen Description BLOOD SITE NOT SPECIFIED  Final   Special Requests   Final    BOTTLES DRAWN AEROBIC AND ANAEROBIC Blood Culture adequate volume   Culture   Final    NO GROWTH 5 DAYS Performed at [facility], [address], [place], [name] [address]    Report Status 11/26/2023 FINAL  Final  Culture, blood (Routine X 2) w Reflex to ID Panel     Status: None   Collection Time: 11/21/23 12:45 PM   Specimen: BLOOD  Result Value Ref Range Status   Specimen Description BLOOD SITE NOT SPECIFIED  Final   Special Requests   Final    BOTTLES DRAWN AEROBIC AND ANAEROBIC Blood Culture adequate volume   Culture   Final    NO GROWTH 5 DAYS Performed at [facility], [address]  [place], [name] [address]    Report Status 11/26/2023 FINAL  Final  Urine Culture (for pregnant, neutropenic or urologic patients or patients with an indwelling urinary catheter)     Status: None   Collection Time: 11/21/23 11:25 PM   Specimen: Urine, Catheterized  Result Value Ref Range Status   Specimen Description URINE, CATHETERIZED  Final   Special Requests NONE  Final   Culture   Final    NO GROWTH Performed at [facility], [address], [place], [name] [address]    Report Status 11/23/2023 FINAL  Final  Culture, blood (Routine X 2) w Reflex to ID Panel     Status: None   Collection Time: 11/26/23 10:24 AM   Specimen: BLOOD LEFT ARM  Result Value Ref Range Status   Specimen Description BLOOD LEFT ARM  Final   Special Requests   Final    BOTTLES DRAWN AEROBIC ONLY Blood Culture results may not be optimal due to an inadequate volume of blood received in culture bottles   Culture   Final    NO GROWTH 5 DAYS Performed at [facility], [address] [place], [name] [address]    Report Status 12/01/2023 FINAL  Final  Culture, blood (Routine X 2) w Reflex to  ID Panel     Status: None   Collection Time: 11/26/23 10:24 AM   Specimen: BLOOD LEFT HAND  Result Value Ref Range Status   Specimen Description BLOOD LEFT HAND  Final   Special Requests   Final    BOTTLES DRAWN AEROBIC ONLY Blood Culture results may not be optimal due to an inadequate volume of blood received in culture bottles   Culture   Final    NO GROWTH 5 DAYS Performed at [facility], [address], [place], [name] [address]    Report Status 12/01/2023 FINAL  Final  Culture, blood (Routine X 2) w Reflex to ID Panel     Status: None   Collection Time: 12/06/23 11:36 AM   Specimen: BLOOD RIGHT ARM  Result Value Ref Range Status   Specimen Description BLOOD RIGHT ARM  Final   Special Requests   Final    BOTTLES DRAWN AEROBIC ONLY Blood Culture results may not be optimal due to an inadequate  volume of blood received in culture bottles   Culture   Final    NO GROWTH 5 DAYS Performed at [facility], [address], [place], [name] [address]    Report Status 12/11/2023 FINAL  Final  Culture, blood (Routine X 2) w Reflex to ID Panel     Status: None   Collection Time: 12/06/23 11:37 AM   Specimen: BLOOD LEFT ARM  Result Value Ref Range Status   Specimen Description BLOOD LEFT ARM  Final   Special Requests   Final    BOTTLES DRAWN AEROBIC ONLY Blood Culture results may not be optimal due to an inadequate volume of blood received in culture bottles   Culture   Final    NO GROWTH 5 DAYS Performed at [facility], [address], [place], [name] [address]    Report Status 12/11/2023 FINAL  Final  Culture, Urine (Do not remove urinary catheter, catheter placed by urology or difficult to place)     Status: None   Collection Time: 12/27/23 10:05 AM   Specimen: Urine, Catheterized  Result Value Ref Range Status   Specimen Description URINE, CATHETERIZED  Final   Special Requests NONE  Final   Culture   Final    NO GROWTH Performed at [facility], [address], [place], [name] [address]    Report Status 12/28/2023 FINAL  Final  Remove and replace urinary cath (placed > 5 days) then obtain urine culture from new indwelling urinary catheter.     Status: None   Collection Time: 12/29/23  8:17 AM   Specimen: Urine, Catheterized  Result Value Ref Range Status   Specimen Description URINE, CATHETERIZED  Final   Special Requests NONE  Final   Culture   Final    NO GROWTH Performed at [facility], [address] [place], [name] [address]    Report Status 12/30/2023 FINAL  Final  Respiratory (~20 pathogens) panel by PCR     Status: None   Collection Time: 12/29/23  9:21 AM   Specimen: Nasopharyngeal Swab; Respiratory  Result Value Ref Range Status   Adenovirus NOT DETECTED NOT DETECTED Final   Coronavirus 229E NOT DETECTED NOT DETECTED Final     Comment: (NOTE) The Coronavirus on the Respiratory Panel, DOES NOT test for the novel  Coronavirus (2019 nCoV)    Coronavirus HKU1 NOT DETECTED NOT DETECTED Final   Coronavirus NL63 NOT DETECTED NOT DETECTED Final  Coronavirus OC43 NOT DETECTED NOT DETECTED Final   Metapneumovirus NOT DETECTED NOT DETECTED Final   Rhinovirus / Enterovirus NOT DETECTED NOT DETECTED Final   Influenza A NOT DETECTED NOT DETECTED Final   Influenza B NOT DETECTED NOT DETECTED Final   Parainfluenza Virus 1 NOT DETECTED NOT DETECTED Final   Parainfluenza Virus 2 NOT DETECTED NOT DETECTED Final   Parainfluenza Virus 3 NOT DETECTED NOT DETECTED Final   Parainfluenza Virus 4 NOT DETECTED NOT DETECTED Final   Respiratory Syncytial Virus NOT DETECTED NOT DETECTED Final   Bordetella pertussis NOT DETECTED NOT DETECTED Final   Bordetella Parapertussis NOT DETECTED NOT DETECTED Final   Chlamydophila pneumoniae NOT DETECTED NOT DETECTED Final   Mycoplasma pneumoniae NOT DETECTED NOT DETECTED Final    Comment: Performed at [facility], [address] [place], [name] [address]  Resp panel by RT-PCR (RSV, Flu A&B, Covid) Anterior Nasal Swab     Status: None   Collection Time: 12/29/23  9:21 AM   Specimen: Anterior Nasal Swab  Result Value Ref Range Status   SARS Coronavirus 2 by RT PCR NEGATIVE NEGATIVE Final   Influenza A by PCR NEGATIVE NEGATIVE Final   Influenza B by PCR NEGATIVE NEGATIVE Final    Comment: (NOTE) The Xpert Xpress SARS-CoV-2/FLU/RSV plus assay is intended as an aid in the diagnosis of influenza from Nasopharyngeal swab specimens and should not be used as a sole basis for treatment. Nasal washings and aspirates are unacceptable for Xpert Xpress SARS-CoV-2/FLU/RSV testing.  Fact Sheet for Patients: BloggerCourse.com  Fact Sheet for Healthcare Providers: SeriousBroker.it  This test is not yet approved or cleared by the [place]  FDA  and has been authorized for detection and/or diagnosis of SARS-CoV-2 by FDA under an Emergency Use Authorization (EUA). This EUA will remain in effect (meaning this test can be used) for the duration of the COVID-19 declaration under Section 564(b)(1) of the Act, 21 U.S.C. section 360bbb-3(b)(1), unless the authorization is terminated or revoked.     Resp Syncytial Virus by PCR NEGATIVE NEGATIVE Final    Comment: (NOTE) Fact Sheet for Patients: BloggerCourse.com  Fact Sheet for Healthcare Providers: SeriousBroker.it  This test is not yet approved or cleared by the [place]  FDA and has been authorized for detection and/or diagnosis of SARS-CoV-2 by FDA under an Emergency Use Authorization (EUA). This EUA will remain in effect (meaning this test can be used) for the duration of the COVID-19 declaration under Section 564(b)(1) of the Act, 21 U.S.C. section 360bbb-3(b)(1), unless the authorization is terminated or revoked.  Performed at [facility], [address], [place], [name] [address]   Culture, blood (Routine X 2) w Reflex to ID Panel     Status: None   Collection Time: 12/29/23  9:26 AM   Specimen: BLOOD RIGHT HAND  Result Value Ref Range Status   Specimen Description BLOOD RIGHT HAND  Final   Special Requests   Final    BOTTLES DRAWN AEROBIC AND ANAEROBIC Blood Culture adequate volume   Culture   Final    NO GROWTH 5 DAYS Performed at [facility], [address] [place], [name] [address]    Report Status 01/03/2024 FINAL  Final  Culture, blood (Routine X 2) w Reflex to ID Panel     Status: Abnormal   Collection Time: 12/29/23  9:40 AM   Specimen: BLOOD LEFT HAND  Result Value Ref Range Status   Specimen Description BLOOD LEFT HAND  Final  Special Requests   Final    BOTTLES DRAWN AEROBIC AND ANAEROBIC Blood Culture adequate volume   Culture  Setup Time   Final    GRAM POSITIVE COCCI AEROBIC  BOTTLE ONLY CRITICAL RESULT CALLED TO, READ BACK BY AND VERIFIED WITH: PHARMD V BRYK 12/30/2023 @ 2330 BY AB    Culture (A)  Final    STAPHYLOCOCCUS EPIDERMIDIS THE SIGNIFICANCE OF ISOLATING THIS ORGANISM FROM A SINGLE SET OF BLOOD CULTURES WHEN MULTIPLE SETS ARE DRAWN IS UNCERTAIN. PLEASE NOTIFY THE MICROBIOLOGY DEPARTMENT WITHIN ONE WEEK IF SPECIATION AND SENSITIVITIES ARE REQUIRED. Performed at [facility], [address] [place], [name] [address]    Report Status 01/01/2024 FINAL  Final  Blood Culture ID Panel (Reflexed)     Status: Abnormal   Collection Time: 12/29/23  9:40 AM  Result Value Ref Range Status   Enterococcus faecalis NOT DETECTED NOT DETECTED Final   Enterococcus Faecium NOT DETECTED NOT DETECTED Final   Listeria monocytogenes NOT DETECTED NOT DETECTED Final   Staphylococcus species DETECTED (A) NOT DETECTED Final    Comment: CRITICAL RESULT CALLED TO, READ BACK BY AND VERIFIED WITH: PHARMD V BRYK 12/30/2023 @ 2330 BY AB    Staphylococcus aureus (BCID) NOT DETECTED NOT DETECTED Final   Staphylococcus epidermidis DETECTED (A) NOT DETECTED Final    Comment: Methicillin (oxacillin) resistant coagulase negative staphylococcus. Possible blood culture contaminant (unless isolated from more than one blood culture draw or clinical case suggests pathogenicity). No antibiotic treatment is indicated for blood  culture contaminants. CRITICAL RESULT CALLED TO, READ BACK BY AND VERIFIED WITH: PHARMD V BRYK 12/30/2023 @ 2330 BY AB    Staphylococcus lugdunensis NOT DETECTED NOT DETECTED Final   Streptococcus species NOT DETECTED NOT DETECTED Final   Streptococcus agalactiae NOT DETECTED NOT DETECTED Final   Streptococcus pneumoniae NOT DETECTED NOT DETECTED Final   Streptococcus pyogenes NOT DETECTED NOT DETECTED Final   A.calcoaceticus-baumannii NOT DETECTED NOT DETECTED Final   Bacteroides fragilis NOT DETECTED NOT DETECTED Final   Enterobacterales NOT DETECTED NOT  DETECTED Final   Enterobacter cloacae complex NOT DETECTED NOT DETECTED Final   Escherichia coli NOT DETECTED NOT DETECTED Final   Klebsiella aerogenes NOT DETECTED NOT DETECTED Final   Klebsiella oxytoca NOT DETECTED NOT DETECTED Final   Klebsiella pneumoniae NOT DETECTED NOT DETECTED Final   Proteus species NOT DETECTED NOT DETECTED Final   Salmonella species NOT DETECTED NOT DETECTED Final   Serratia marcescens NOT DETECTED NOT DETECTED Final   Haemophilus influenzae NOT DETECTED NOT DETECTED Final   Neisseria meningitidis NOT DETECTED NOT DETECTED Final   Pseudomonas aeruginosa NOT DETECTED NOT DETECTED Final   Stenotrophomonas maltophilia NOT DETECTED NOT DETECTED Final   Candida albicans NOT DETECTED NOT DETECTED Final   Candida auris NOT DETECTED NOT DETECTED Final   Candida glabrata NOT DETECTED NOT DETECTED Final   Candida krusei NOT DETECTED NOT DETECTED Final   Candida parapsilosis NOT DETECTED NOT DETECTED Final   Candida tropicalis NOT DETECTED NOT DETECTED Final   Cryptococcus neoformans/gattii NOT DETECTED NOT DETECTED Final   Methicillin resistance mecA/C DETECTED (A) NOT DETECTED Final    Comment: CRITICAL RESULT CALLED TO, READ BACK BY AND VERIFIED WITH: PHARMD V BRYK 12/30/2023 @ 2330 BY AB Performed at [facility], [name] [place], [name] [address]   MRSA Next Gen by PCR, Nasal     Status: None   Collection Time: 12/29/23 12:45 PM   Specimen: Nasal Mucosa; Nasal Swab  Result Value Ref Range Status   MRSA by PCR Next Gen NOT DETECTED NOT DETECTED Final    Comment: (NOTE) The GeneXpert MRSA Assay (FDA approved for NASAL specimens only), is one component of a comprehensive MRSA colonization surveillance program. It is not intended to diagnose MRSA infection nor to guide or monitor treatment for MRSA infections. Test performance is not FDA approved in patients less than 87 years old. Performed at [facility], [address], [place],  [name] [address]   Culture, blood (Routine X 2) w Reflex to ID Panel     Status: None   Collection Time: 12/31/23  2:17 AM   Specimen: BLOOD RIGHT ARM  Result Value Ref Range Status   Specimen Description BLOOD RIGHT ARM  Final   Special Requests   Final    BOTTLES DRAWN AEROBIC AND ANAEROBIC Blood Culture adequate volume   Culture   Final    NO GROWTH 5 DAYS Performed at [facility], [address], [place], [name] [address]    Report Status 01/05/2024 FINAL  Final  Culture, blood (Routine X 2) w Reflex to ID Panel     Status: None   Collection Time: 12/31/23  2:17 AM   Specimen: BLOOD RIGHT HAND  Result Value Ref Range Status   Specimen Description BLOOD RIGHT HAND  Final   Special Requests   Final    BOTTLES DRAWN AEROBIC AND ANAEROBIC Blood Culture adequate volume   Culture   Final    NO GROWTH 5 DAYS Performed at [facility], [address], [place], [name] [address]    Report Status 01/05/2024 FINAL  Final  Culture, blood (Routine X 2) w Reflex to ID Panel     Status: None (Preliminary result)   Collection Time: 01/02/24  9:34 AM   Specimen: BLOOD  Result Value Ref Range Status   Specimen Description BLOOD SITE NOT SPECIFIED  Final   Special Requests   Final    BOTTLES DRAWN AEROBIC AND ANAEROBIC Blood Culture adequate volume   Culture   Final    NO GROWTH 4 DAYS Performed at [facility], [address], [place], [name] [address]    Report Status PENDING  Incomplete  Culture, blood (Routine X 2) w Reflex to ID Panel     Status: Abnormal (Preliminary result)   Collection Time: 01/02/24  9:34 AM   Specimen: BLOOD  Result Value Ref Range Status   Specimen Description BLOOD SITE NOT SPECIFIED  Final   Special Requests   Final    BOTTLES DRAWN AEROBIC AND ANAEROBIC Blood Culture adequate volume   Culture  Setup Time   Final    BUDDING YEAST SEEN IN BOTH AEROBIC AND ANAEROBIC BOTTLES CRITICAL RESULT CALLED TO, READ BACK BY AND VERIFIED WITH: PHARMD  E.SINCLAIR AT 0831 ON 01/04/2024 BY T.SAAD.    Culture (A)  Final    CANDIDA GLABRATA Sent to [facility] for further susceptibility testing. Performed at [facility], [address], [place], [name] [address]    Report Status PENDING  Incomplete  Blood Culture ID Panel (Reflexed)     Status: Abnormal   Collection Time: 01/02/24  9:34 AM  Result Value Ref Range Status   Enterococcus faecalis NOT DETECTED NOT DETECTED Final   Enterococcus Faecium NOT DETECTED NOT DETECTED Final   Listeria monocytogenes NOT DETECTED NOT DETECTED Final   Staphylococcus species NOT DETECTED NOT DETECTED Final   Staphylococcus aureus (BCID)  NOT DETECTED NOT DETECTED Final   Staphylococcus epidermidis NOT DETECTED NOT DETECTED Final   Staphylococcus lugdunensis NOT DETECTED NOT DETECTED Final   Streptococcus species NOT DETECTED NOT DETECTED Final   Streptococcus agalactiae NOT DETECTED NOT DETECTED Final   Streptococcus pneumoniae NOT DETECTED NOT DETECTED Final   Streptococcus pyogenes NOT DETECTED NOT DETECTED Final   A.calcoaceticus-baumannii NOT DETECTED NOT DETECTED Final   Bacteroides fragilis NOT DETECTED NOT DETECTED Final   Enterobacterales NOT DETECTED NOT DETECTED Final   Enterobacter cloacae complex NOT DETECTED NOT DETECTED Final   Escherichia coli NOT DETECTED NOT DETECTED Final   Klebsiella aerogenes NOT DETECTED NOT DETECTED Final   Klebsiella oxytoca NOT DETECTED NOT DETECTED Final   Klebsiella pneumoniae NOT DETECTED NOT DETECTED Final   Proteus species NOT DETECTED NOT DETECTED Final   Salmonella species NOT DETECTED NOT DETECTED Final   Serratia marcescens NOT DETECTED NOT DETECTED Final   Haemophilus influenzae NOT DETECTED NOT DETECTED Final   Neisseria meningitidis NOT DETECTED NOT DETECTED Final   Pseudomonas aeruginosa NOT DETECTED NOT DETECTED Final   Stenotrophomonas maltophilia NOT DETECTED NOT DETECTED Final   Candida albicans NOT DETECTED NOT DETECTED Final   Candida  auris NOT DETECTED NOT DETECTED Final   Candida glabrata DETECTED (A) NOT DETECTED Final    Comment: CRITICAL RESULT CALLED TO, READ BACK BY AND VERIFIED WITH: PHARMD E.SINCLAIR AT 0831 ON 01/04/2024 BY T.SAAD.    Candida krusei NOT DETECTED NOT DETECTED Final   Candida parapsilosis NOT DETECTED NOT DETECTED Final   Candida tropicalis NOT DETECTED NOT DETECTED Final   Cryptococcus neoformans/gattii NOT DETECTED NOT DETECTED Final    Comment: Performed at [facility], [address] [place], [name] [address]  Culture, blood (Routine X 2) w Reflex to ID Panel     Status: None (Preliminary result)   Collection Time: 01/05/24  3:14 PM   Specimen: BLOOD RIGHT HAND  Result Value Ref Range Status   Specimen Description BLOOD RIGHT HAND  Final   Special Requests   Final    BOTTLES DRAWN AEROBIC ONLY Blood Culture results may not be optimal due to an inadequate volume of blood received in culture bottles   Culture   Final    NO GROWTH < 24 HOURS Performed at [facility], [address] [place], [name] [address]    Report Status PENDING  Incomplete  Culture, blood (Routine X 2) w Reflex to ID Panel     Status: None (Preliminary result)   Collection Time: 01/05/24  3:14 PM   Specimen: BLOOD LEFT HAND  Result Value Ref Range Status   Specimen Description BLOOD LEFT HAND  Final   Special Requests   Final    BOTTLES DRAWN AEROBIC ONLY Blood Culture results may not be optimal due to an inadequate volume of blood received in culture bottles   Culture   Final    NO GROWTH < 24 HOURS Performed at [facility], [address], [place], [name] [address]    Report Status PENDING  Incomplete    Studies/Results: DG CHEST PORT 1 VIEW Result Date: 01/06/2024 EXAM: 1 VIEW XRAY OF THE CHEST 01/06/2024 06:12:39 AM COMPARISON: AP radiograph of the chest dated 01/02/2024. CLINICAL HISTORY: Cardiac arrhythmia. FINDINGS: LUNGS AND PLEURA: Mildly prominent interstitial opacities, which  appear chronic. No focal pulmonary opacity. No pulmonary edema. No pleural effusion. No pneumothorax. HEART AND MEDIASTINUM: The heart is normal in size. Calcification within the aortic arch. No  acute abnormality of the cardiac and mediastinal silhouettes. BONES AND SOFT TISSUES: No acute osseous abnormality. IMPRESSION: 1. No acute findings. 2. Mildly prominent interstitial opacities, which appear chronic. Electronically signed by: Evalene Coho MD 01/06/2024 06:18 AM EDT RP Workstation: [id number]   DG Abd 1 View Result Date: 01/05/2024 CLINICAL DATA:  Abdominal pain.  Persistent nausea. EXAM: ABDOMEN - 1 VIEW COMPARISON:  CT 12/29/2023 FINDINGS: Scattered air throughout small bowel that is not abnormally dilated, no evidence of obstruction. Air within nondilated colon without significant formed colonic stool. No radiopaque calculi. Brachytherapy seeds in the prostate. The lung bases are clear. IMPRESSION: Nonobstructive bowel gas pattern. Electronically Signed   By: Andrea Gasman M.D.   On: 01/05/2024 16:52    Assessment: Gross hematuria  Procedure(s): AMPUTATION BELOW KNEE APPLICATION, WOUND VAC, 50 Days Post-Op  doing well.  Plan: Continue light drip CBI.  Likely be able to start weaning on Monday.  Looks like it is slowing down.  No indication for surgery.   Sherwood Edison, MD Urology 01/06/2024, 3:55 PM

## 2024-01-06 NOTE — Progress Notes (Signed)
 ID brief note WBC decreasing. No fevers FU bcx neg  Imp/ rec Candidemia  Unless repeat blood cx continues to grow candida, 2 weeks treatment would be fine; taken in consideration of presence of chronic lv thrombus -continue micafungin  for 2 weeks from last negative blood cx (repeat bcx pending for this afternoon -finish ceftriaxone  2 weeks from 8/29 (until 9/12) -outpatient eye exam -maintain standard isolation precaution -if abd pain persists and persistent hypotension consider abd/pelv ct

## 2024-01-06 NOTE — Progress Notes (Signed)
 NAME:  Derek Thompson, MRN:  [id number], DOB:  12-24-1960, LOS: 64 ADMISSION DATE:  11/02/2023, CONSULTATION DATE:  829 REFERRING MD:  Shawn, CHIEF COMPLAINT:  shock   History of Present Illness:   Mr. 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 His course has been significant for suspected cathter associated UTI s/p IV antibiotics from 7/22-7/27. He also developed AKI on CKD and further work-up revealed multiple myeloma on bone marrow biopsy. In early August he was cleared for hospital discharge but was unable to find an accepting facility and did not have a safe discharge dispo due to homelessness. He was enrolled in the [facility]. On 8/29 he was transferred to the ICU for somnolence, hypotension and new fever with concern for mixed septic and cardiogenic shock. He was briefly vasopressors, started on dobutamine  and resumed on IV Zosyn  with presumed urinary source of infection. He was transferred to the floor on 9/1 and IMTS resumed care 9/2. He subsequently developed new fever, tachycardia and hypotension prompting PCCM consult and return to ICU.   Pertinent  Medical History  Heart failure with reduced EF, baseline in the 20 range, urinary retention secondary to history of prostate cancer with chronic Foley, chronic right lower extremity osteomyelitis, CKD stage IIIb.  Significant Hospital Events: Including procedures, antibiotic start and stop dates in addition to other pertinent events   7/4 Admitted 7/17 Bone marrow biopsy 7/18 s/p R BKA 8/27-8/28 rising BUN and Cr 8/28 new fever, hematuria, AMS and shock requiring NE, echocardiogram showing EF<20% and moderate RV dysfunction, started on  dobutamine   9/3 off pressors and dobutamine , undergoing CBI 9/4 runs of NSVT overnight, stable without pressors, BC + for candida, ID consult and micafungin   9/5 BUN/creatinine continue to trend up, remained tachycardic  Interim History / Subjective:  Overnight patient went into SVT, became hypotensive, received IV metoprolol  with conversion to sinus tach Now heart rate is in 120s Remained afebrile Continue to complain of nausea and vomiting  Objective    Blood pressure 101/72, pulse (!) 124, temperature 97.6 F (36.4 C), temperature source Oral, resp. rate (!) 22, height 6' (1.829 m), weight 59.8 kg, SpO2 98%.        Intake/Output Summary (Last 24 hours) at 01/06/2024 0819 Last data filed at 01/06/2024 0800 Gross per 24 hour  Intake 9044.4 ml  Output 89764 ml  Net -1190.6 ml   Filed Weights   01/02/24 0349 01/03/24 0600 01/05/24 0318  Weight: 59 kg 58.1 kg 59.8 kg    Physical exam: General: Acute on chronically ill-appearing male, lying on the bed HEENT: [place]/AT, eyes anicteric.  moist mucus membranes Neuro: Alert, awake following commands Chest: Coarse breath sounds, no wheezes or rhonchi Heart: Tachycardic, regular rhythm, no murmurs or gallops Abdomen: Soft, nontender, nondistended, bowel sounds present Extremities: Status post right BKA  Labs and images reviewed  Patient Lines/Drains/Airways Status     Active Line/Drains/Airways     Name Placement date Placement time Site Days   Peripheral IV 12/14/23 22 G 1 Left;Lateral;Posterior Forearm 12/14/23  0747  Forearm  23   Peripheral IV 01/04/24 22 G 2.5 Anterior;Left Forearm 01/04/24  1132  Forearm  2   Peripheral IV 01/05/24 22 G  Left;Posterior Hand 01/05/24  1622  Hand  1   Urethral Catheter Anna Fimpong,RN  24 Fr. 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 Prior history of prostate cancer s/p radiation Acute blood loss anemia on anemia of chronic disease Acute on chronic hyponatremia in the setting of CBI and CHF Diabetes type 2 with steroid-induced hyperglycemia Chronic osteomyelitis of right lower extremity status post BKA Severe protein calorie malnutrition  Patient is requiring on and off vasopressor support due to combination of rhythm change and sepsis Appreciate infectious disease recommendations, continue ceftriaxone  and micafungin  Monitor intake and output GDMT is limited by shock Continue IV heparin  infusion Overnight went into SVT required metoprolol , now he is in sinus tach LFTs are improving Serum creatinine continue to rise, currently at 7.4 and BUN is 139 Nephrology is following, he may end up on CRRT but likely not a candidate for long-term dialysis Monitor BMP Received dexamethasone  20 mg 2 days ago, continue once a week Urology is following, he is on CBI, urine looks pink now compared to few days ago with gross hematuria Monitor H&H Received 1 unit PRBC overnight Closely monitor electrolytes Continue insulin  with CBG goal 140-180 Continue dietary supplements  Prognosis guarded, considering multisystem organ dysfunction Palliative care is following   Labs   CBC: Recent Labs  Lab 01/02/24 1509 01/03/24 0500 01/04/24 0224 01/05/24 0248 01/05/24 1044 01/05/24 1841 01/06/24 0504  WBC 14.6*   < > 9.6 17.1* 32.3* 26.9* 17.8*  NEUTROABS 13.5*  --   --   --   --   --   --   HGB 10.2*   < > 10.5* 8.5* 8.7* 6.8* 9.0*  HCT 31.0*   < >  31.9* 25.4* 26.6* 20.3* 26.0*  MCV 84.9   < > 86.4 84.7 86.9 85.3 85.8  PLT 251   < > 222 198 218 168 177   < > = values in this interval not displayed.    Basic Metabolic Panel: Recent Labs  Lab 01/01/24 0457 01/02/24 9367 01/02/24 1509 01/02/24 1849 01/03/24 0500 01/04/24 0224 01/05/24 0248 01/06/24 0504  NA 126* 129* 127*  --  125* 129* 125* 124*  K 4.1 4.3 4.2  --  4.5 4.5 4.3 5.1  CL 91* 92* 93*  --  93* 93* 90* 86*  CO2 22 21* 19*  --  17* 18* 20* 21*  GLUCOSE 129* 110* 107*  --  81 197* 317* 171*  BUN 112* 100* 109*  --  106* 114* 130* 139*  CREATININE 5.02* 4.88* 5.05*  --  5.60* 6.03* 6.66* 7.40*  CALCIUM  8.3* 8.3* 8.0*  --  8.1* 8.1* 8.2* 8.3*  MG 2.2 2.4  --  2.2  --  2.3 2.3  --   PHOS 5.1* 4.6  --   --   --   --  6.1*  --    GFR: Estimated Creatinine Clearance: 8.5 mL/min (A) (by C-G formula based on SCr  of 7.4 mg/dL (H)). Recent Labs  Lab 12/30/23 1047 12/30/23 1601 01/02/24 0632 01/02/24 1509 01/02/24 1639 01/03/24 0500 01/05/24 0248 01/05/24 1044 01/05/24 1841 01/06/24 0504  PROCALCITON  --   --  3.29  --   --   --   --   --   --   --   WBC  --    < > 8.3   < >  --    < > 17.1* 32.3* 26.9* 17.8*  LATICACIDVEN 1.4  --   --   --  0.9  --   --   --   --   --    < > = values in this interval not displayed.    Liver Function Tests: Recent Labs  Lab 01/02/24 1509 01/06/24 0504  AST 178* 27  ALT 372* 114*  ALKPHOS 43 63  BILITOT 0.6 0.8  PROT 7.4 6.8  ALBUMIN 1.8* 1.8*   Recent Labs  Lab 01/05/24 1841  LIPASE 25   No results for input(s): AMMONIA in the last 168 hours.  ABG    Component Value Date/Time   PHART 7.488 (H) 12/29/2023 1620   PCO2ART 20.9 (L) 12/29/2023 1620   PO2ART 98 12/29/2023 1620   HCO3 15.9 (L) 12/29/2023 1620   TCO2 17 (L) 12/29/2023 1620   ACIDBASEDEF 7.0 (H) 12/29/2023 1620   O2SAT 61 01/04/2024 0544     Coagulation Profile: No results for input(s): INR, PROTIME in the last 168 hours.  Cardiac  Enzymes: No results for input(s): CKTOTAL, CKMB, CKMBINDEX, TROPONINI in the last 168 hours.   HbA1C: Hgb A1c MFr Bld  Date/Time Value Ref Range Status  09/02/2023 06:15 AM 8.7 (H) 4.8 - 5.6 % Final    Comment:    (NOTE) Pre diabetes:          5.7%-6.4%  Diabetes:              >6.4%  Glycemic control for   <7.0% adults with diabetes   06/13/2023 07:40 PM 13.3 (H) 4.8 - 5.6 % Final    Comment:    (NOTE) Pre diabetes:          5.7%-6.4%  Diabetes:              >6.4%  Glycemic control for   <7.0% adults with diabetes     CBG: Recent Labs  Lab 01/05/24 1409 01/05/24 1556 01/05/24 2008 01/05/24 2321 01/06/24 0322  GLUCAP 147* 203* 251* 220* 182*     The patient is critically ill due to Shock/AKI/Sepsis.  Critical care was necessary to treat or prevent imminent or life-threatening deterioration.  Critical care was time spent personally by me on the following activities: development of treatment plan with patient and/or surrogate as well as nursing, discussions with consultants, evaluation of patient's response to treatment, examination of patient, obtaining history from patient or surrogate, ordering and performing treatments and interventions, ordering and review of laboratory studies, ordering and review of radiographic studies, pulse oximetry, re-evaluation of patient's condition and participation in multidisciplinary rounds.   During this encounter critical care time was devoted to patient care services described in this note for 40 minutes.     Valinda Novas, MD [facility] See Amion for pager If no response to pager, please call [phone] until 7pm After 7pm, Please call E-link [phone]

## 2024-01-06 NOTE — Progress Notes (Signed)
 [facility] Progress Note    Assessment/ Plan:   AKI on CKD -suspect ATN at this point. Causes are likely related to recent shock/decrease renal perfusion, MGRS, obstruction. Received contrast on 8/29. repeat renal ultrasound still with hydronephrosis-urology following -Cr continues to rise, up to 7.4 -still without current indications for renal replacement therapy at this junction but seems that he is heading down this route. Discussed in detail with patient today. I shared  my concerns in regards to doing dialysis in him particularly his HFrEF, already low BP, low functional status (BKA) and now fungemic. Long term HD will be problematic in the context of his HFrEF and I cannot foresee him being a candidate for PD in the future given unstable housing situation and given that he is fungemic. I shared these concerns again but ultimately the decision is up to him. -at this junction, I recommend continuing supportive care -Avoid nephrotoxic medications including NSAIDs and iodinated intravenous contrast exposure unless the latter is absolutely indicated.  Preferred narcotic agents for pain control are hydromorphone , fentanyl , and methadone. Morphine  should not be used. Avoid Baclofen and avoid oral sodium phosphate  and magnesium  citrate based laxatives / bowel preps. Continue strict Input and Output monitoring. Will monitor the patient closely with you and intervene or adjust therapy as indicated by changes in clinical status/labs    Septic shock Fungemia Multifocal pneumonia -abx/micofungin per primary service -off pressors now. Keep MAP >65 to ensure adequate renal perfusion -ID following   Urinary retention Hematuria -urology following, undergoing CBI    LV thrombus -on heparin  drip   Multiple Myeloma -diagnosed via bone marrow biopsy. Plan to follow up with oncology OP. Given his acute/current issues along with functional status, I do not foresee him undergoing chemo at  this time given his current comorbidities. 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 Na 124 this AM. Asymptomatic   Acidosis -secondary to AKI, trend for now, stable   Chronic osteomyelitis s/p right BKA -per primary service   Anemia -transfuse prn for Hgb <7 -hgb from 6.8 to 9 this am s/p 1u prbc  DM2 with hyperglycemia -per primary   Recommendations conveyed to primary service.  Guarded prognosis.    Subjective:   Patient seen and examined. He reports that insulin  makes him nauseated. But otherwise denies any chest pain, SOB, dysgeusia, loss of appetite, new shakes/tremors still. Events: required 1u prbc, briefly required peripheral levophed , tachycardia. No acute findings on CXR this AM.   Objective:   BP 101/72   Pulse (!) 124   Temp 97.6 F (36.4 C) (Oral)   Resp (!) 22   Ht 6' (1.829 m)   Wt 59.8 kg   SpO2 98%   BMI 17.88 kg/m   Intake/Output Summary (Last 24 hours) at 01/06/2024 9192 Last data filed at 01/06/2024 9295 Gross per 24 hour  Intake 8044.4 ml  Output 9135 ml  Net -1090.6 ml   Weight change:   Physical Exam: Gen: NAD, chronically ill appearing, laying flat in bed CVS: tachycardic Resp: cta bl Abd: soft, nt/nd Ext: no edema, right BKA Neuro: awake, alert, no tremors/myoclonic jerks observed  Imaging: DG CHEST PORT 1 VIEW Result Date: 01/06/2024 EXAM: 1 VIEW XRAY OF THE CHEST 01/06/2024 06:12:39 AM COMPARISON: AP radiograph of the chest dated 01/02/2024. CLINICAL HISTORY: Cardiac arrhythmia. FINDINGS: LUNGS AND PLEURA: Mildly prominent interstitial opacities, which appear chronic. No focal pulmonary opacity. No pulmonary edema. No pleural effusion.  No pneumothorax. HEART AND MEDIASTINUM: The heart is normal in size. Calcification within the aortic arch. No acute abnormality of the cardiac and mediastinal silhouettes. BONES AND SOFT TISSUES: No acute osseous  abnormality. IMPRESSION: 1. No acute findings. 2. Mildly prominent interstitial opacities, which appear chronic. Electronically signed by: Evalene Coho MD 01/06/2024 06:18 AM EDT RP Workstation: [id number]   DG Abd 1 View Result Date: 01/05/2024 CLINICAL DATA:  Abdominal pain.  Persistent nausea. EXAM: ABDOMEN - 1 VIEW COMPARISON:  CT 12/29/2023 FINDINGS: Scattered air throughout small bowel that is not abnormally dilated, no evidence of obstruction. Air within nondilated colon without significant formed colonic stool. No radiopaque calculi. Brachytherapy seeds in the prostate. The lung bases are clear. IMPRESSION: Nonobstructive bowel gas pattern. Electronically Signed   By: Andrea Gasman M.D.   On: 01/05/2024 16:52    Labs: BMET Recent Labs  Lab 01/01/24 0457 01/02/24 9367 01/02/24 1509 01/03/24 0500 01/04/24 0224 01/05/24 0248 01/06/24 0504  NA 126* 129* 127* 125* 129* 125* 124*  K 4.1 4.3 4.2 4.5 4.5 4.3 5.1  CL 91* 92* 93* 93* 93* 90* 86*  CO2 22 21* 19* 17* 18* 20* 21*  GLUCOSE 129* 110* 107* 81 197* 317* 171*  BUN 112* 100* 109* 106* 114* 130* 139*  CREATININE 5.02* 4.88* 5.05* 5.60* 6.03* 6.66* 7.40*  CALCIUM  8.3* 8.3* 8.0* 8.1* 8.1* 8.2* 8.3*  PHOS 5.1* 4.6  --   --   --  6.1*  --    CBC Recent Labs  Lab 01/02/24 1509 01/03/24 0500 01/05/24 0248 01/05/24 1044 01/05/24 1841 01/06/24 0504  WBC 14.6*   < > 17.1* 32.3* 26.9* 17.8*  NEUTROABS 13.5*  --   --   --   --   --   HGB 10.2*   < > 8.5* 8.7* 6.8* 9.0*  HCT 31.0*   < > 25.4* 26.6* 20.3* 26.0*  MCV 84.9   < > 84.7 86.9 85.3 85.8  PLT 251   < > 198 218 168 177   < > = values in this interval not displayed.    Medications:     sodium chloride    Intravenous Once   acetaminophen   1,000 mg Oral Q6H   atorvastatin   80 mg Oral Daily   Chlorhexidine  Gluconate Cloth  6 each Topical Daily   dexamethasone  (DECADRON ) injection  20 mg Intravenous Weekly   ezetimibe   10 mg Oral Daily   feeding supplement  237 mL  Oral TID BM   Gerhardt's butt cream   Topical BID   insulin  aspart  0-15 Units Subcutaneous Q4H   metoprolol  tartrate       multivitamin  1 tablet Oral QHS   mupirocin  ointment   Nasal BID   thiamine   100 mg Oral Daily      Ephriam Stank, MD [facility] 01/06/2024, 8:07 AM

## 2024-01-06 NOTE — Progress Notes (Signed)
 PHARMACY - ANTICOAGULATION CONSULT NOTE  Pharmacy Consult:  Heparin  Indication: LV thrombus  Allergies  Allergen Reactions   Cefepime  Other (See Comments)    Cefepime  neurotoxicity 11/07/23    Patient Measurements: Height: 6' (182.9 cm) Weight: 59.8 kg (131 lb 13.4 oz) IBW/kg (Calculated) : 77.6 HEPARIN  DW (KG): 42.1  Vital Signs: Temp: 97.8 F (36.6 C) (09/06 1201) Temp Source: Oral (09/06 1201) BP: 110/62 (09/06 1100) Pulse Rate: 120 (09/06 1100)  Labs: Recent Labs    01/04/24 0224 01/04/24 0544 01/05/24 0248 01/05/24 1044 01/05/24 1841 01/06/24 0504  HGB 10.5*  --  8.5* 8.7* 6.8* 9.0*  HCT 31.9*  --  25.4* 26.6* 20.3* 26.0*  PLT 222  --  198 218 168 177  HEPARINUNFRC  --  0.39 0.49  --   --  0.32  CREATININE 6.03*  --  6.66*  --   --  7.40*    Estimated Creatinine Clearance: 8.5 mL/min (A) (by C-G formula based on SCr of 7.4 mg/dL (H)).   Assessment: 63 YOM with  LV thrombus off Eliquis  due to hematuria, last dose 8/29 AM.  CBC stable and Pharmacy consulted to dose IV heparin .  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  Confirmed weight of ~42 kg.   01/06/24: Heparin  level 0.32, therapeutic on heparin  1100 units/hr. No issues with infusion running per RN. Continuing heparin  for treatment of LV thrombus. CBC remains stable (Hgb 9, PLT 177). Last transfusion on 01/01/24.   Goal of Therapy:  Heparin  level 0.3-0.5 units/ml d/t bleeding risk Monitor platelets by anticoagulation protocol: Yes   Plan:  Continue IV heparin  gtt at 1100 units/hr to target lower end of range Daily heparin  level and CBC Monitor for hematuria resolution  Harlene Barlow, Berdine [employer], [facility] Clinical Pharmacist  01/06/2024 1:02 PM   [facility] pharmacy phone numbers are listed on [email or web address]

## 2024-01-06 NOTE — Progress Notes (Signed)
 Daily Progress Note   Patient Name: Derek Thompson       Date: 01/06/2024 DOB: 18-Oct-1960  Age: 63 y.o. MRN#: [id number] Attending Physician: Harold Scholz, MD Primary Care Physician: Elicia Sharper, DO Admit Date: 11/02/2023  Reason for Consultation/Follow-up: Establishing goals of care  Length of Stay: 64  Current Medications: Scheduled Meds:   sodium chloride    Intravenous Once   acetaminophen   1,000 mg Oral Q6H   atorvastatin   80 mg Oral Daily   Chlorhexidine  Gluconate Cloth  6 each Topical Daily   dexamethasone  (DECADRON ) injection  20 mg Intravenous Weekly   ezetimibe   10 mg Oral Daily   feeding supplement  237 mL Oral TID BM   Gerhardt's butt cream   Topical BID   insulin  aspart  0-15 Units Subcutaneous Q4H   metoCLOPramide  (REGLAN ) injection  5 mg Intravenous Q6H   metoprolol  tartrate       multivitamin  1 tablet Oral QHS   mupirocin  ointment   Nasal BID   thiamine   100 mg Oral Daily    Continuous Infusions:  sodium chloride  Stopped (01/05/24 2051)   cefTRIAXone  (ROCEPHIN )  IV Stopped (01/05/24 1728)   heparin  1,100 Units/hr (01/06/24 1000)   micafungin  (MYCAMINE ) 100 mg in sodium chloride  0.9 % 100 mL IVPB 105 mL/hr at 01/06/24 1000   norepinephrine  (LEVOPHED ) Adult infusion Stopped (01/06/24 0704)   sodium chloride  irrigation      PRN Meds: metoprolol  tartrate, mouth rinse, polyethylene glycol, scopolamine   Physical Exam Vitals reviewed.  Constitutional:      General: He is not in acute distress.    Appearance: He is ill-appearing.  HENT:     Head: Normocephalic and atraumatic.  Cardiovascular:     Rate and Rhythm: Tachycardia present.  Pulmonary:     Effort: Tachypnea present.  Skin:    General: Skin is warm and dry.  Neurological:     Mental  Status: He is alert and oriented to person, place, and time.  Psychiatric:        Mood and Affect: Affect is flat.             Vital Signs: BP 110/62   Pulse (!) 120   Temp 98.2 F (36.8 C) (Oral)   Resp (!) 22   Ht 6' (1.829 m)   Wt 59.8  kg   SpO2 99%   BMI 17.88 kg/m  SpO2: SpO2: 99 % O2 Device: O2 Device: Room Air    Patient Active Problem List   Diagnosis Date Noted   Fungemia 01/05/2024   Protein-calorie malnutrition, severe 01/04/2024   Moderate protein-calorie malnutrition (HCC) 12/02/2023   Hx of right BKA (HCC) 11/27/2023   Monoclonal gammopathy 11/12/2023   Hypoalbuminemia 11/08/2023   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 (HCC) 11/04/2023   Chronic anemia 11/04/2023   Generalized weakness 11/03/2023   Sinus tachycardia 11/03/2023   HFrEF (heart failure with reduced ejection fraction) (HCC) 11/02/2023   Chronic osteomyelitis involving lower leg, right (HCC) 11/02/2023   Frailty 11/02/2023   Urinary retention 11/02/2023   Pyelonephritis and Cystitis 10/14/2023   Hyponatremia 10/12/2023   Hyperglycemia 10/12/2023   Tachycardia 10/11/2023   Screening for lung cancer 09/11/2023   Housing instability 09/11/2023   Gangrene of right foot (HCC) 09/02/2023   Acute thrombus of left ventricle (HCC) 06/19/2023   NSVT (nonsustained ventricular tachycardia) (HCC) 06/19/2023   Mixed hyperlipidemia 06/19/2023   Osteomyelitis of right foot (HCC) 06/15/2023   AKI (acute kidney injury) (HCC) 06/14/2023   Anemia 06/14/2023   Severe sepsis with septic shock (HCC) 06/14/2023   Hyperkalemia 11/22/2022   Iron  deficiency anemia 08/27/2022   MDD (major depressive disorder) 08/27/2022   Malignant neoplasm of prostate (HCC)    CAD (coronary artery disease) 06/10/2020   Hypertension 06/10/2020   DM2 (diabetes mellitus, type 2) (HCC) 06/10/2020   Chronic systolic heart failure [facility])     Palliative Care Assessment & Plan   Patient Profile: 63 y.o. male   with past medical history of end-stage HFrEF, CKD stage 3b, prior CVA, chronic osteomyelitis of the right foot, prior history of prostate cancer s/p radiation, and bladder outlet obstruction with chronic Foley who presented to the ED on 11/02/23 with generalized weakness and was admitted with sepsis secondary to progressive right foot osteomyelitis and presumed UTI, also with AKI.  In early August, patient was medically stable for discharge but did not have a safe disposition due to homelessness.  He was [facility] in the [facility].   Palliative Medicine was re-consulted on 9/3 for goals of care discussions.   LOS is 64 days.    Significant Hospital events: 7/4  - admitted 7/6 - initial palliative consult, goals clear for full code/full scope 7/17 -  bone marrow biopsy concerning for multiple myeloma 7/18 - status post Right BKA 8/27 - worsening renal function 8/29 - new fever, hematuria, AMS, and shock requiring pressor support, echocardiogram showing EF less than 20% and moderate RV dysfunction, started on dobutamine , transferred to ICU 9/3 - off pressors and dobutamine , undergoing CBI 9/4 runs of NSVT overnight, stable without pressors, BC + for candida, ID consult and micafungin      Today's Discussion: Reviewed chart and received updates from nursing and attending team. Patient lying in bed sleeping in no apparent distress. Awakens when I enter the room. Patient reports his nausea is currently improved and he feels much better. He reports having a bad night due to insulin  making him feel poorly. We discussed the importance of controlling glucose levels in critically ill patients.   We discussed the patient's multiple acute and chronic medical conditions including AKI on CKD, heart failure, and recent multiple myeloma diagnosis. I shared my concern that his health is fragile and he is at high risk for further decline despite aggressive treatment efforts.   Reinforced  the importance of the  patient considering his goals of care. Discussed code status. Patient shared he has provided CPR in the past. He understands how hard it can be on the body. Shared my worry that resuscitation may be unsuccessful or may put him in a worse condition. He remains a full code. Patient shared he would not want to remain on life support permanently. Asked patient if his mother knows his wishes regarding life support- he is unsure but believes so. Encouraged him to discuss his wishes with his HCPOA/mother. Patient did not discuss his goals of care with his HCPOA/mother. He agreed that I could call her and update her on his condition and discuss goals of care.   Discussed the importance of continued conversation with family and the medical providers regarding overall plan of care and treatment options, ensuring decisions are within the context of the patient's values and GOCs.  Encouraged patient to call PMT with questions or needs. PMT will continue to follow.  11:50 am: Left voicemail for patient's mother Derek Thompson requesting callback.  Recommendations/Plan: Continue full scope care Patient remains hopeful for medical stabilization and recovery to the extent this is possible Patient plans to discuss his medical wishes/goals with his mother HCPOA Ongoing palliative support    Code Status:    Code Status Orders  (From admission, onward)           Start     Ordered   11/02/23 1439  Full code  Continuous       Question:  By:  Answer:  Consent: discussion documented in EHR   11/02/23 1440         Extensive chart review has been completed prior to seeing the patient including labs, vital signs, imaging, progress/consult notes, orders, medications, and available advance directive documents.  Care plan was discussed with bedside RN and Dr. Harold  Time spent: 50 minutes  Thank you for allowing the Palliative Medicine Team to assist in the care of this patient.    Stephane CHRISTELLA Palin,  NP  Please contact Palliative Medicine Team phone at [phone] for questions and concerns.

## 2024-01-07 DIAGNOSIS — Z7189 Other specified counseling: Secondary | ICD-10-CM | POA: Diagnosis not present

## 2024-01-07 DIAGNOSIS — B49 Unspecified mycosis: Secondary | ICD-10-CM | POA: Diagnosis not present

## 2024-01-07 DIAGNOSIS — B377 Candidal sepsis: Secondary | ICD-10-CM | POA: Diagnosis not present

## 2024-01-07 DIAGNOSIS — R6521 Severe sepsis with septic shock: Secondary | ICD-10-CM | POA: Diagnosis not present

## 2024-01-07 DIAGNOSIS — A419 Sepsis, unspecified organism: Secondary | ICD-10-CM | POA: Diagnosis not present

## 2024-01-07 DIAGNOSIS — Z515 Encounter for palliative care: Secondary | ICD-10-CM | POA: Diagnosis not present

## 2024-01-07 DIAGNOSIS — I5023 Acute on chronic systolic (congestive) heart failure: Secondary | ICD-10-CM | POA: Diagnosis not present

## 2024-01-07 LAB — COMPREHENSIVE METABOLIC PANEL WITH GFR
ALT: 81 U/L — ABNORMAL HIGH (ref 0–44)
AST: 43 U/L — ABNORMAL HIGH (ref 15–41)
Albumin: 1.7 g/dL — ABNORMAL LOW (ref 3.5–5.0)
Alkaline Phosphatase: 57 U/L (ref 38–126)
Anion gap: 16 — ABNORMAL HIGH (ref 5–15)
BUN: 150 mg/dL — ABNORMAL HIGH (ref 8–23)
CO2: 18 mmol/L — ABNORMAL LOW (ref 22–32)
Calcium: 8.2 mg/dL — ABNORMAL LOW (ref 8.9–10.3)
Chloride: 86 mmol/L — ABNORMAL LOW (ref 98–111)
Creatinine, Ser: 8.47 mg/dL — ABNORMAL HIGH (ref 0.61–1.24)
GFR, Estimated: 6 mL/min — ABNORMAL LOW (ref >60)
Glucose, Bld: 166 mg/dL — ABNORMAL HIGH (ref 70–99)
Potassium: 5.4 mmol/L — ABNORMAL HIGH (ref 3.5–5.1)
Sodium: 120 mmol/L — ABNORMAL LOW (ref 135–145)
Total Bilirubin: 0.7 mg/dL (ref 0.0–1.2)
Total Protein: 6.3 g/dL — ABNORMAL LOW (ref 6.5–8.1)

## 2024-01-07 LAB — GLUCOSE, CAPILLARY
Glucose-Capillary: 101 mg/dL — ABNORMAL HIGH (ref 70–99)
Glucose-Capillary: 124 mg/dL — ABNORMAL HIGH (ref 70–99)
Glucose-Capillary: 133 mg/dL — ABNORMAL HIGH (ref 70–99)
Glucose-Capillary: 178 mg/dL — ABNORMAL HIGH (ref 70–99)
Glucose-Capillary: 63 mg/dL — ABNORMAL LOW (ref 70–99)
Glucose-Capillary: 73 mg/dL (ref 70–99)
Glucose-Capillary: 96 mg/dL (ref 70–99)

## 2024-01-07 LAB — HEMOGLOBIN AND HEMATOCRIT, BLOOD
HCT: 21.9 % — ABNORMAL LOW (ref 39.0–52.0)
Hemoglobin: 7.3 g/dL — ABNORMAL LOW (ref 13.0–17.0)

## 2024-01-07 LAB — BASIC METABOLIC PANEL WITH GFR
Anion gap: 17 — ABNORMAL HIGH (ref 5–15)
BUN: 144 mg/dL — ABNORMAL HIGH (ref 8–23)
CO2: 19 mmol/L — ABNORMAL LOW (ref 22–32)
Calcium: 8.5 mg/dL — ABNORMAL LOW (ref 8.9–10.3)
Chloride: 87 mmol/L — ABNORMAL LOW (ref 98–111)
Creatinine, Ser: 8.04 mg/dL — ABNORMAL HIGH (ref 0.61–1.24)
GFR, Estimated: 7 mL/min — ABNORMAL LOW (ref >60)
Glucose, Bld: 71 mg/dL (ref 70–99)
Potassium: 4.6 mmol/L (ref 3.5–5.1)
Sodium: 123 mmol/L — ABNORMAL LOW (ref 135–145)

## 2024-01-07 LAB — HEPARIN LEVEL (UNFRACTIONATED)
Heparin Unfractionated: 0.23 [IU]/mL — ABNORMAL LOW (ref 0.30–0.70)
Heparin Unfractionated: 0.25 [IU]/mL — ABNORMAL LOW (ref 0.30–0.70)
Heparin Unfractionated: 0.25 [IU]/mL — ABNORMAL LOW (ref 0.30–0.70)

## 2024-01-07 LAB — CULTURE, BLOOD (ROUTINE X 2)
Culture: NO GROWTH
Special Requests: ADEQUATE

## 2024-01-07 LAB — CBC
HCT: 20.9 % — ABNORMAL LOW (ref 39.0–52.0)
Hemoglobin: 7.2 g/dL — ABNORMAL LOW (ref 13.0–17.0)
MCH: 29.5 pg (ref 26.0–34.0)
MCHC: 34.4 g/dL (ref 30.0–36.0)
MCV: 85.7 fL (ref 80.0–100.0)
Platelets: 156 K/uL (ref 150–400)
RBC: 2.44 MIL/uL — ABNORMAL LOW (ref 4.22–5.81)
RDW: 17.3 % — ABNORMAL HIGH (ref 11.5–15.5)
WBC: 19.6 K/uL — ABNORMAL HIGH (ref 4.0–10.5)
nRBC: 0 % (ref 0.0–0.2)

## 2024-01-07 MED ORDER — SODIUM CHLORIDE 0.9 % IV SOLN: 250.0000 mL | INTRAVENOUS | Status: AC

## 2024-01-07 MED ORDER — METOCLOPRAMIDE HCL 5 MG/ML IJ SOLN: 5.0000 mg | Freq: Two times a day (BID) | INTRAMUSCULAR | Status: DC | PRN

## 2024-01-07 MED ORDER — DEXTROSE 50 % IV SOLN
INTRAVENOUS | Status: AC
  Administered 2024-01-07: 12.5 g via INTRAVENOUS
  Filled 2024-01-07: qty 50

## 2024-01-07 MED ORDER — DEXTROSE 50 % IV SOLN: 12.5000 g | INTRAVENOUS | Status: AC

## 2024-01-07 MED ORDER — NOREPINEPHRINE 4 MG/250ML-% IV SOLN
0.0000 ug/min | INTRAVENOUS | Status: DC
  Administered 2024-01-07: 2 ug/min via INTRAVENOUS
  Administered 2024-01-08: 10 ug/min via INTRAVENOUS
  Administered 2024-01-08: 9 ug/min via INTRAVENOUS
  Administered 2024-01-08: 10 ug/min via INTRAVENOUS
  Filled 2024-01-07 (×3): qty 250

## 2024-01-07 NOTE — Progress Notes (Signed)
 eLink Physician-Brief Progress Note Patient Name: Derek Thompson DOB: Mar 30, 1961 MRN: [id number]   Date of Service  01/07/2024  HPI/Events of Note  Patient hypotensive with bp 72/50.  Levophed  order stopped earlier today  eICU Interventions  Restart levophed  Monitor response     Intervention Category Major Interventions: Hypotension - evaluation and management  [name], P 01/07/2024, 9:13 PM

## 2024-01-07 NOTE — Progress Notes (Signed)
 PHARMACY - ANTICOAGULATION CONSULT NOTE  Pharmacy Consult:  Heparin  Indication: LV thrombus  Allergies  Allergen Reactions   Cefepime  Other (See Comments)    Cefepime  neurotoxicity 11/07/23    Patient Measurements: Height: 6' (182.9 cm) Weight: 59.8 kg (131 lb 13.4 oz) IBW/kg (Calculated) : 77.6 HEPARIN  DW (KG): 42.1  Vital Signs: Temp: 99.1 F (37.3 C) (09/07 1937) Temp Source: Axillary (09/07 1937) BP: 109/74 (09/07 1800) Pulse Rate: 106 (09/07 1800)  Labs: Recent Labs    01/05/24 1841 01/06/24 0504 01/06/24 1330 01/06/24 2226 01/07/24 0335 01/07/24 1113 01/07/24 1422 01/07/24 2002  HGB 6.8* 9.0*   < > 7.9* 7.2*  --  7.3*  --   HCT 20.3* 26.0*   < > 23.2* 20.9*  --  21.9*  --   PLT 168 177  --   --  156  --   --   --   HEPARINUNFRC  --  0.32  --   --  0.23* 0.25*  --  0.25*  CREATININE  --  7.40*  --   --  8.04*  --  8.47*  --    < > = values in this interval not displayed.    Estimated Creatinine Clearance: 7.5 mL/min (A) (by C-G formula based on SCr of 8.47 mg/dL (H)).   Assessment: 64 YOM with LV thrombus off Eliquis  due to hematuria, last dose 8/29 AM.  CBC stable and Pharmacy consulted to dose IV heparin .  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  Heparin  level remains subtherapeutic at 0.25, on 1150 units/hr. No infusion issues. Still with hematuria but not worsening. Of note, had been on 1150 units/hr in past with heparin  level on higher end of goal.   9/7 PM update - HL remains 0.25 despite small rate increase to 1200 units/hr.  Hematuria stable, no other issues/bleeding per RN.  Goal of Therapy:  Heparin  level 0.3-0.5 units/ml d/t bleeding risk Monitor platelets by anticoagulation protocol: Yes   Plan:  Increase heparin  slightly to 1250 units/hr to target lower end of range F/u 8 hr heparin  level Monitor daily heparin  level, CBC, signs/symptoms of  bleeding  Monitor for hematuria resolution  Thank you for allowing pharmacy to participate in this patient's care,  Maurilio Fila, PharmD Clinical Pharmacist 01/07/2024  8:33 PM  Please check AMION for all [facility] Pharmacy phone numbers After 10:00 PM, call Main Pharmacy [phone]

## 2024-01-07 NOTE — Progress Notes (Signed)
 Daily Progress Note   Patient Name: Derek Thompson       Date: 01/07/2024 DOB: 23-Nov-1960  Age: 63 y.o. MRN#: [id number] Attending Physician: Harold Scholz, MD Primary Care Physician: Elicia Sharper, DO Admit Date: 11/02/2023  Reason for Consultation/Follow-up: Establishing goals of care  Length of Stay: 42  Current Medications: Scheduled Meds:   sodium chloride    Intravenous Once   acetaminophen   1,000 mg Oral Q6H   atorvastatin   80 mg Oral Daily   Chlorhexidine  Gluconate Cloth  6 each Topical Daily   dexamethasone  (DECADRON ) injection  20 mg Intravenous Weekly   ezetimibe   10 mg Oral Daily   feeding supplement  237 mL Oral TID BM   Gerhardt's butt cream   Topical BID   insulin  aspart  0-15 Units Subcutaneous Q4H   multivitamin  1 tablet Oral QHS   mupirocin  ointment   Nasal BID   thiamine   100 mg Oral Daily    Continuous Infusions:  cefTRIAXone  (ROCEPHIN )  IV Stopped (01/06/24 1551)   heparin  1,200 Units/hr (01/07/24 1159)   micafungin  (MYCAMINE ) 100 mg in sodium chloride  0.9 % 100 mL IVPB Stopped (01/07/24 1045)   sodium chloride  irrigation      PRN Meds: metoCLOPramide  (REGLAN ) injection, mouth rinse, polyethylene glycol, scopolamine   Physical Exam Vitals reviewed.  Constitutional:      General: He is sleeping. He is not in acute distress.    Appearance: He is ill-appearing.  HENT:     Head: Normocephalic and atraumatic.  Cardiovascular:     Rate and Rhythm: Tachycardia present.  Pulmonary:     Effort: Tachypnea present.  Skin:    General: Skin is warm and dry.  Neurological:     Mental Status: He is oriented to person, place, and time and easily aroused.  Psychiatric:        Mood and Affect: Affect is flat.             Vital Signs: BP 108/66   Pulse  (!) 108   Temp (!) 97.2 F (36.2 C) (Axillary)   Resp (!) 21   Ht 6' (1.829 m)   Wt 59.8 kg   SpO2 96%   BMI 17.88 kg/m  SpO2: SpO2: 96 % O2 Device: O2 Device: Room Air    Patient Active Problem List  Diagnosis Date Noted   Fungemia 01/05/2024   Protein-calorie malnutrition, severe 01/04/2024   Moderate protein-calorie malnutrition (HCC) 12/02/2023   Hx of right BKA (HCC) 11/27/2023   Monoclonal gammopathy 11/12/2023   Hypoalbuminemia 11/08/2023   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 (HCC) 11/04/2023   Chronic anemia 11/04/2023   Generalized weakness 11/03/2023   Sinus tachycardia 11/03/2023   HFrEF (heart failure with reduced ejection fraction) (HCC) 11/02/2023   Chronic osteomyelitis involving lower leg, right (HCC) 11/02/2023   Frailty 11/02/2023   Urinary retention 11/02/2023   Pyelonephritis and Cystitis 10/14/2023   Hyponatremia 10/12/2023   Hyperglycemia 10/12/2023   Tachycardia 10/11/2023   Screening for lung cancer 09/11/2023   Housing instability 09/11/2023   Gangrene of right foot (HCC) 09/02/2023   Acute thrombus of left ventricle (HCC) 06/19/2023   NSVT (nonsustained ventricular tachycardia) (HCC) 06/19/2023   Mixed hyperlipidemia 06/19/2023   Osteomyelitis of right foot (HCC) 06/15/2023   AKI (acute kidney injury) (HCC) 06/14/2023   Anemia 06/14/2023   Severe sepsis with septic shock (HCC) 06/14/2023   Hyperkalemia 11/22/2022   Iron  deficiency anemia 08/27/2022   MDD (major depressive disorder) 08/27/2022   Malignant neoplasm of prostate (HCC)    CAD (coronary artery disease) 06/10/2020   Hypertension 06/10/2020   DM2 (diabetes mellitus, type 2) (HCC) 06/10/2020   Chronic systolic heart failure [facility])     Palliative Care Assessment & Plan   Patient Profile: 63 y.o. male  with past medical history of end-stage HFrEF, CKD stage 3b, prior CVA, chronic osteomyelitis of the right foot, prior history of prostate cancer s/p  radiation, and bladder outlet obstruction with chronic Foley who presented to the ED on 11/02/23 with generalized weakness and was admitted with sepsis secondary to progressive right foot osteomyelitis and presumed UTI, also with AKI.  In early August, patient was medically stable for discharge but did not have a safe disposition due to homelessness.  He was enrolled in the [facility].   Palliative Medicine was re-consulted on 9/3 for goals of care discussions.   LOS is 64 days.    Significant Hospital events: 7/4  - admitted 7/6 - initial palliative consult, goals clear for full code/full scope 7/17 -  bone marrow biopsy concerning for multiple myeloma 7/18 - status post Right BKA 8/27 - worsening renal function 8/29 - new fever, hematuria, AMS, and shock requiring pressor support, echocardiogram showing EF less than 20% and moderate RV dysfunction, started on dobutamine , transferred to ICU 9/3 - off pressors and dobutamine , undergoing CBI 9/4 runs of NSVT overnight, stable without pressors, BC + for candida, ID consult and micafungin      Today's Discussion: Reviewed chart and received updates from nursing. Plan is to potentially start CRRT tomorrow. Patient lying in bed sleeping in no apparent distress. Awakens after I call his name a few times. Reminded patient of palliative care's role.   Patient has not discussed his wishes with his mother. He shared he has not spoken to his mother in the last day or two. He agreed that I could call her and update her on his condition and discuss goals of care. I shared my concern that his health is fragile and he is at high risk for further decline despite aggressive treatment efforts.   Discussed the importance of continued conversation with family and the medical providers regarding overall plan of care and treatment options, ensuring decisions are within the context of the patient's values and GOCs.  Encouraged  patient to call PMT with questions or  needs. PMT will continue to follow.  2:15 pm: Spoke to patient's mother Seaborn Nakama by telephone. She is the patient's HCPOA. Updated her on patient's hospitalization.  She was unaware of his heart failure, the severity of his kidney disease, and of his new multiple myeloma diagnosis.  She said the patient has always been a very private person.  We discussed his multisystem organ dysfunction and his high risk for further decline despite aggressive treatment efforts.  [name] plans to come to [place]  tomorrow with her son Francis.  She will be available Tuesday morning for a goals of care meeting-Will have a PMT provider reach out tomorrow to schedule.  [name] shares she is going through cancer therapy herself and cannot stay in town for long.  She is grateful PMT has reached out to her.  3:45 pm: Told patient about the discussion with his mother. He is glad she will be coming tomorrow. We discussed the plans for a goals of care meeting.   Recommendations/Plan: Continue full scope care Patient remains hopeful for medical stabilization and recovery to the extent this is possible Continued discussion re: goals of care GOC meeting- Tuesday 9/9 time TBD Ongoing palliative support    Code Status:    Code Status Orders  (From admission, onward)           Start     Ordered   11/02/23 1439  Full code  Continuous       Question:  By:  Answer:  Consent: discussion documented in EHR   11/02/23 1440         Extensive chart review has been completed prior to seeing the patient including labs, vital signs, imaging, progress/consult notes, orders, medications, and available advance directive documents.  Care plan was discussed with bedside RN and Dr. Harold  Time spent: 70 minutes  Thank you for allowing the Palliative Medicine Team to assist in the care of this patient.    Stephane CHRISTELLA Palin, NP  Please contact Palliative Medicine Team phone at [phone] for questions and concerns.

## 2024-01-07 NOTE — Progress Notes (Signed)
 [facility] Progress Note    Assessment/ Plan:   AKI on CKD -suspect ATN at this point. Causes are likely related to recent shock/decrease renal perfusion, MGRS, obstruction. Received contrast on 8/29. repeat renal ultrasound still with hydronephrosis-urology following -Cr continues to rise, up to 8, BUN up to 144 -now with asterixis/early uremic symptoms. At this junction, we have agreed to wait until tomorrow to start HD (he is going to think about this more). Overall, not an ideal long term candidate for HD given his current clinical situation/comorbidities (HFrEF with hypotension, poor functional status-now with BKA, fungemia, untreated MM). I do not foresee him being a candidate for PD in the long run at this time given unstable housing situation. -anticipating HD catheter placement with PCCM tomorrow--appreciate assistance -Avoid nephrotoxic medications including NSAIDs and iodinated intravenous contrast exposure unless the latter is absolutely indicated.  Preferred narcotic agents for pain control are hydromorphone , fentanyl , and methadone. Morphine  should not be used. Avoid Baclofen and avoid oral sodium phosphate  and magnesium  citrate based laxatives / bowel preps. Continue strict Input and Output monitoring. Will monitor the patient closely with you and intervene or adjust therapy as indicated by changes in clinical status/labs    Septic shock Fungemia Multifocal pneumonia -abx/micofungin per primary service -off pressors now. Keep MAP >65 to ensure adequate renal perfusion -ID following   Urinary retention Hematuria -urology following, undergoing CBI    LV thrombus -on heparin  drip   Multiple Myeloma -diagnosed via bone marrow biopsy. Plan to follow up with oncology OP. Given his acute/current issues along with functional status, I do not foresee him undergoing chemo at this time given his current comorbidities. On dexamethasone  weekly now    Hyponatremia -likely secondary to pseudohyponatremia secondary to monoclonal gammopathy and AKI. Na down to 123--Asymptomatic   Acidosis -secondary to AKI, trend for now, stable   Chronic osteomyelitis s/p right BKA -per primary service   Anemia -transfuse prn for Hgb <7 -hgb 7.2 -iron  not indicated given sepsis, holding off on ESA for now  DM2 with hyperglycemia -per primary   Recommendations conveyed to primary service.  Guarded prognosis.    Subjective:   Patient seen and examined. No acute events. Nasuea/vomiting with insulin  is slightly better provided he gets antiemetics prior to insulin . Otherwise no complaints.    Objective:   BP 116/71   Pulse (!) 119   Temp (!) 97.4 F (36.3 C) (Axillary)   Resp 20   Ht 6' (1.829 m)   Wt 59.8 kg   SpO2 98%   BMI 17.88 kg/m   Intake/Output Summary (Last 24 hours) at 01/07/2024 9094 Last data filed at 01/07/2024 0800 Gross per 24 hour  Intake 87178 ml  Output 15600 ml  Net -2779 ml   Weight change:   Physical Exam: Gen: NAD, chronically ill appearing, laying flat in bed CVS: tachycardic Resp: cta bl Abd: soft, nt/nd Ext: no edema, right BKA Neuro: awake, alert, +asterixis  Imaging: DG CHEST PORT 1 VIEW Result Date: 01/06/2024 EXAM: 1 VIEW XRAY OF THE CHEST 01/06/2024 06:12:39 AM COMPARISON: AP radiograph of the chest dated 01/02/2024. CLINICAL HISTORY: Cardiac arrhythmia. FINDINGS: LUNGS AND PLEURA: Mildly prominent interstitial opacities, which appear chronic. No focal pulmonary opacity. No pulmonary edema. No pleural effusion. No pneumothorax. HEART AND MEDIASTINUM: The heart is normal in size. Calcification within the aortic arch. No acute abnormality of the cardiac and mediastinal silhouettes. BONES AND SOFT TISSUES: No acute osseous abnormality. IMPRESSION: 1. No acute findings. 2. Mildly prominent  interstitial opacities, which appear chronic. Electronically signed by: Evalene Coho MD 01/06/2024 06:18 AM EDT RP  Workstation: [id number]   DG Abd 1 View Result Date: 01/05/2024 CLINICAL DATA:  Abdominal pain.  Persistent nausea. EXAM: ABDOMEN - 1 VIEW COMPARISON:  CT 12/29/2023 FINDINGS: Scattered air throughout small bowel that is not abnormally dilated, no evidence of obstruction. Air within nondilated colon without significant formed colonic stool. No radiopaque calculi. Brachytherapy seeds in the prostate. The lung bases are clear. IMPRESSION: Nonobstructive bowel gas pattern. Electronically Signed   By: Andrea Gasman M.D.   On: 01/05/2024 16:52    Labs: BMET Recent Labs  Lab 01/01/24 0457 01/02/24 9367 01/02/24 1509 01/03/24 0500 01/04/24 0224 01/05/24 0248 01/06/24 0504 01/07/24 0335  NA 126* 129* 127* 125* 129* 125* 124* 123*  K 4.1 4.3 4.2 4.5 4.5 4.3 5.1 4.6  CL 91* 92* 93* 93* 93* 90* 86* 87*  CO2 22 21* 19* 17* 18* 20* 21* 19*  GLUCOSE 129* 110* 107* 81 197* 317* 171* 71  BUN 112* 100* 109* 106* 114* 130* 139* 144*  CREATININE 5.02* 4.88* 5.05* 5.60* 6.03* 6.66* 7.40* 8.04*  CALCIUM  8.3* 8.3* 8.0* 8.1* 8.1* 8.2* 8.3* 8.5*  PHOS 5.1* 4.6  --   --   --  6.1*  --   --    CBC Recent Labs  Lab 01/02/24 1509 01/03/24 0500 01/05/24 1044 01/05/24 1841 01/06/24 0504 01/06/24 1330 01/06/24 1624 01/06/24 2226 01/07/24 0335  WBC 14.6*   < > 32.3* 26.9* 17.8*  --   --   --  19.6*  NEUTROABS 13.5*  --   --   --   --   --   --   --   --   HGB 10.2*   < > 8.7* 6.8* 9.0* 7.2* 7.5* 7.9* 7.2*  HCT 31.0*   < > 26.6* 20.3* 26.0* 21.4* 21.9* 23.2* 20.9*  MCV 84.9   < > 86.9 85.3 85.8  --   --   --  85.7  PLT 251   < > 218 168 177  --   --   --  156   < > = values in this interval not displayed.    Medications:     sodium chloride    Intravenous Once   acetaminophen   1,000 mg Oral Q6H   atorvastatin   80 mg Oral Daily   Chlorhexidine  Gluconate Cloth  6 each Topical Daily   dexamethasone  (DECADRON ) injection  20 mg Intravenous Weekly   ezetimibe   10 mg Oral Daily   feeding supplement   237 mL Oral TID BM   Gerhardt's butt cream   Topical BID   insulin  aspart  0-15 Units Subcutaneous Q4H   multivitamin  1 tablet Oral QHS   mupirocin  ointment   Nasal BID   thiamine   100 mg Oral Daily      Ephriam Stank, MD [facility] 01/07/2024, 9:05 AM

## 2024-01-07 NOTE — Progress Notes (Signed)
 Hypoglycemic Event  CBG: 63  Treatment: D50 25 mL (12.5 gm)  Symptoms: None  Follow-up CBG: Time:2340 CBG Result:101  Possible Reasons for Event: Inadequate meal intake  Comments/MD notified: Discussed with E link RN.     Hobert DELENA Sink, RN

## 2024-01-07 NOTE — Progress Notes (Signed)
 PHARMACY - ANTICOAGULATION CONSULT NOTE  Pharmacy Consult:  Heparin  Indication: LV thrombus  Allergies  Allergen Reactions   Cefepime  Other (See Comments)    Cefepime  neurotoxicity 11/07/23    Patient Measurements: Height: 6' (182.9 cm) Weight: 59.8 kg (131 lb 13.4 oz) IBW/kg (Calculated) : 77.6 HEPARIN  DW (KG): 42.1  Vital Signs: Temp: 97.2 F (36.2 C) (09/07 1100) Temp Source: Axillary (09/07 1100) BP: 108/70 (09/07 1100) Pulse Rate: 111 (09/07 1100)  Labs: Recent Labs    01/05/24 0248 01/05/24 1044 01/05/24 1841 01/06/24 0504 01/06/24 1330 01/06/24 1624 01/06/24 2226 01/07/24 0335 01/07/24 1113  HGB 8.5*   < > 6.8* 9.0*   < > 7.5* 7.9* 7.2*  --   HCT 25.4*   < > 20.3* 26.0*   < > 21.9* 23.2* 20.9*  --   PLT 198   < > 168 177  --   --   --  156  --   HEPARINUNFRC 0.49  --   --  0.32  --   --   --  0.23* 0.25*  CREATININE 6.66*  --   --  7.40*  --   --   --  8.04*  --    < > = values in this interval not displayed.    Estimated Creatinine Clearance: 7.9 mL/min (A) (by C-G formula based on SCr of 8.04 mg/dL (H)).   Assessment: 49 YOM with LV thrombus off Eliquis  due to hematuria, last dose 8/29 AM.  CBC stable and Pharmacy consulted to dose IV heparin .  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  Heparin  level remains subtherapeutic at 0.25, on 1150 units/hr. No infusion issues. Still with hematuria but not worsening. Of note, had been on 1150 units/hr in past with heparin  level on higher end of goal.   Goal of Therapy:  Heparin  level 0.3-0.5 units/ml d/t bleeding risk Monitor platelets by anticoagulation protocol: Yes   Plan:  Increase heparin  1200 units/hr to target lower end of range F/u 8 hr heparin  level Monitor daily heparin  level, CBC, signs/symptoms of bleeding  Monitor for hematuria resolution  Thank you for allowing pharmacy to participate in this  patient's care,  Suzen Sour, PharmD, BCCCP Clinical Pharmacist  Phone: [phone] 01/07/2024 11:47 AM  Please check AMION for all [facility] phone numbers After 10:00 PM, call Main Pharmacy [phone]

## 2024-01-07 NOTE — Progress Notes (Signed)
 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 Patient is without complaint.  Continues with mild ongoing gross hematuria.  Hemoglobin 7.2.  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)   Chronic anemia   Hypoalbuminemia   Monoclonal gammopathy   Hx of right BKA (HCC)   Moderate protein-calorie malnutrition (HCC)   Protein-calorie malnutrition, severe   Fungemia  Current Facility-Administered Medications  Medication Dose Route Frequency Provider Last Rate Last Admin   0.9 %  sodium chloride  infusion (Manually program via Guardrails IV Fluids)   Intravenous Once Paliwal, Aditya, MD       acetaminophen  (TYLENOL ) tablet 1,000 mg  1,000 mg Oral Q6H McLendon, Michael, MD   1,000 mg at 01/07/24 0844   atorvastatin  (LIPITOR ) tablet 80 mg  80 mg Oral Daily Byrum, Robert S, MD   80 mg at 01/07/24 0844   cefTRIAXone  (ROCEPHIN ) 2 g in sodium chloride  0.9 % 100 mL IVPB  2 g Intravenous Q24H Vu, Constance DASEN, MD   Stopped at 01/06/24 1551   Chlorhexidine  Gluconate Cloth 2  % PADS 6 each  6 each Topical Daily Shelah Lamar RAMAN, MD   6 each at 01/07/24 9057   dexamethasone  (DECADRON ) injection 20 mg  20 mg Intravenous Weekly Gleason, Leita SAUNDERS, PA-C   20 mg at 01/03/24 1807   ezetimibe  (ZETIA ) tablet 10 mg  10 mg Oral Daily Byrum, Robert S, MD   10 mg at 01/07/24 0844   feeding supplement (ENSURE PLUS HIGH PROTEIN) liquid 237 mL  237 mL Oral TID BM Harold Scholz, MD   237 mL at 01/07/24 9057   Gerhardt's butt cream   Topical BID Harold Scholz, MD   Given at 01/07/24 0942   heparin  ADULT infusion 100 units/mL (25000 units/250mL)  1,200 Units/hr Intravenous Continuous Sherryll Suzen SQUIBB, RPH 12 mL/hr at 01/07/24 1159 1,200 Units/hr at 01/07/24 1159   insulin  aspart (novoLOG ) injection 0-15 Units  0-15 Units Subcutaneous Q4H Paliwal, Aditya, MD   2 Units at 01/07/24 1203   metoCLOPramide  (REGLAN ) injection 5 mg  5 mg Intravenous Q12H PRN Harold Scholz, MD       micafungin  (MYCAMINE ) 100 mg in sodium chloride  0.9 % 100 mL IVPB  100 mg Intravenous Daily Gleason, Leita SAUNDERS, PA-C   Stopped at 01/07/24 1045   multivitamin (RENA-VIT) tablet 1 tablet  1 tablet Oral QHS Harold Scholz, MD   1 tablet at 01/06/24 2147   mupirocin  ointment (BACTROBAN ) 2 %   Nasal BID Shelah Lamar RAMAN, MD   Given at 01/07/24 0848   Oral care mouth rinse  15 mL Mouth  Rinse PRN Byrum, Robert S, MD       polyethylene glycol (MIRALAX  / GLYCOLAX ) packet 17 g  17 g Oral Daily PRN Shelah Lamar RAMAN, MD   17 g at 12/29/23 0000   scopolamine  (TRANSDERM-SCOP) 1 MG/3DAYS 1 mg  1 patch Transdermal Q72H PRN Gleason, Laura R, PA-C   1 mg at 01/05/24 0350   sodium chloride  irrigation 0.9 % 3,000 mL  3,000 mL Irrigation Continuous Sattenfield, Cameron W, NP   3,000 mL at 01/07/24 0757   thiamine  (VITAMIN B1) tablet 100 mg  100 mg Oral Daily Harold Scholz, MD   100 mg at 01/07/24 0844     Objective: Vital: Vitals:   01/07/24 0900 01/07/24 1000 01/07/24 1100 01/07/24 1200  BP: (!) 95/58 102/64 108/70 108/66  Pulse: (!) 116  (!) 115 (!) 111 (!) 108  Resp: (!) 22 (!) 40 (!) 23 (!) 21  Temp:   (!) 97.2 F (36.2 C)   TempSrc:   Axillary   SpO2: 97% 96% 100% 96%  Weight:      Height:       I/Os: I/O last 3 completed shifts: In: 14483.3 [I.V.:476.5; Blood:426; Other:13350; IV Piggyback:230.8] Out: 81049 [Urine:18950]  Physical Exam:  General: Patient is in no apparent distress Lungs: Normal respiratory effort, chest expands symmetrically. GI: The abdomen is soft and nontender without mass. Foley: Three-way Foley catheter draining slightly blood-tinged on moderate drip CBI Ext: lower extremities symmetric  Lab Results: Recent Labs    01/05/24 1841 01/06/24 0504 01/06/24 1330 01/06/24 1624 01/06/24 2226 01/07/24 0335  WBC 26.9* 17.8*  --   --   --  19.6*  HGB 6.8* 9.0*   < > 7.5* 7.9* 7.2*  HCT 20.3* 26.0*   < > 21.9* 23.2* 20.9*   < > = values in this interval not displayed.   Recent Labs    01/05/24 0248 01/06/24 0504 01/07/24 0335  NA 125* 124* 123*  K 4.3 5.1 4.6  CL 90* 86* 87*  CO2 20* 21* 19*  GLUCOSE 317* 171* 71  BUN 130* 139* 144*  CREATININE 6.66* 7.40* 8.04*  CALCIUM  8.2* 8.3* 8.5*   No results for input(s): LABPT, INR in the last 72 hours. No results for input(s): LABURIN in the last 72 hours. Results for orders placed or performed during the hospital encounter of 11/02/23  Culture, OB Urine     Status: None   Collection Time: 11/03/23  8:11 AM   Specimen: Urine, Random  Result Value Ref Range Status   Specimen Description URINE, RANDOM  Final   Special Requests NONE  Final   Culture   Final    NO GROWTH NO GROUP B STREP (S.AGALACTIAE) ISOLATED Performed at [facility], [address], [place], [name] [address]    Report Status 11/04/2023 FINAL  Final  Culture, blood (Routine X 2) w Reflex to ID Panel     Status: None   Collection Time: 11/03/23 10:23 PM   Specimen: BLOOD RIGHT HAND  Result Value Ref Range Status   Specimen Description BLOOD RIGHT  HAND  Final   Special Requests   Final    BOTTLES DRAWN AEROBIC AND ANAEROBIC Blood Culture adequate volume   Culture   Final    NO GROWTH 5 DAYS Performed at [facility], [address], [place], [name] [address]    Report Status 11/08/2023 FINAL  Final  Culture, blood (Routine X 2) w Reflex to ID Panel  Status: None   Collection Time: 11/03/23 10:25 PM   Specimen: BLOOD RIGHT HAND  Result Value Ref Range Status   Specimen Description BLOOD RIGHT HAND  Final   Special Requests   Final    BOTTLES DRAWN AEROBIC AND ANAEROBIC Blood Culture adequate volume   Culture   Final    NO GROWTH 5 DAYS Performed at [facility], [address] [place], [name] [address]    Report Status 11/08/2023 FINAL  Final  Resp panel by RT-PCR (RSV, Flu A&B, Covid) Anterior Nasal Swab     Status: None   Collection Time: 11/06/23  7:13 AM   Specimen: Anterior Nasal Swab  Result Value Ref Range Status   SARS Coronavirus 2 by RT PCR NEGATIVE NEGATIVE Final   Influenza A by PCR NEGATIVE NEGATIVE Final   Influenza B by PCR NEGATIVE NEGATIVE Final    Comment: (NOTE) The Xpert Xpress SARS-CoV-2/FLU/RSV plus assay is intended as an aid in the diagnosis of influenza from Nasopharyngeal swab specimens and should not be used as a sole basis for treatment. Nasal washings and aspirates are unacceptable for Xpert Xpress SARS-CoV-2/FLU/RSV testing.  Fact Sheet for Patients: BloggerCourse.com  Fact Sheet for Healthcare Providers: SeriousBroker.it  This test is not yet approved or cleared by the [place]  FDA and has been authorized for detection and/or diagnosis of SARS-CoV-2 by FDA under an Emergency Use Authorization (EUA). This EUA will remain in effect (meaning this test can be used) for the duration of the COVID-19 declaration under Section 564(b)(1) of the Act, 21 U.S.C. section 360bbb-3(b)(1), unless the authorization is terminated  or revoked.     Resp Syncytial Virus by PCR NEGATIVE NEGATIVE Final    Comment: (NOTE) Fact Sheet for Patients: BloggerCourse.com  Fact Sheet for Healthcare Providers: SeriousBroker.it  This test is not yet approved or cleared by the [place]  FDA and has been authorized for detection and/or diagnosis of SARS-CoV-2 by FDA under an Emergency Use Authorization (EUA). This EUA will remain in effect (meaning this test can be used) for the duration of the COVID-19 declaration under Section 564(b)(1) of the Act, 21 U.S.C. section 360bbb-3(b)(1), unless the authorization is terminated or revoked.  Performed at [facility], [address] [place], [name] [address]   Respiratory (~20 pathogens) panel by PCR     Status: None   Collection Time: 11/06/23  7:13 AM   Specimen: Nasopharyngeal Swab; Respiratory  Result Value Ref Range Status   Adenovirus NOT DETECTED NOT DETECTED Final   Coronavirus 229E NOT DETECTED NOT DETECTED Final    Comment: (NOTE) The Coronavirus on the Respiratory Panel, DOES NOT test for the novel  Coronavirus (2019 nCoV)    Coronavirus HKU1 NOT DETECTED NOT DETECTED Final   Coronavirus NL63 NOT DETECTED NOT DETECTED Final   Coronavirus OC43 NOT DETECTED NOT DETECTED Final   Metapneumovirus NOT DETECTED NOT DETECTED Final   Rhinovirus / Enterovirus NOT DETECTED NOT DETECTED Final   Influenza A NOT DETECTED NOT DETECTED Final   Influenza B NOT DETECTED NOT DETECTED Final   Parainfluenza Virus 1 NOT DETECTED NOT DETECTED Final   Parainfluenza Virus 2 NOT DETECTED NOT DETECTED Final   Parainfluenza Virus 3 NOT DETECTED NOT DETECTED Final   Parainfluenza Virus 4 NOT DETECTED NOT DETECTED Final   Respiratory Syncytial Virus NOT DETECTED NOT DETECTED Final   Bordetella pertussis NOT DETECTED NOT DETECTED Final   Bordetella Parapertussis NOT DETECTED NOT DETECTED Final   Chlamydophila pneumoniae NOT  DETECTED NOT DETECTED Final   Mycoplasma pneumoniae NOT DETECTED NOT DETECTED Final    Comment: Performed at [facility] Lab, [address], [place], [name] [address]  MRSA Next Gen by PCR, Nasal     Status: Abnormal   Collection Time: 11/06/23  8:53 AM   Specimen: Nasal Mucosa; Nasal Swab  Result Value Ref Range Status   MRSA by PCR Next Gen DETECTED (A) NOT DETECTED Final    Comment: RESULT CALLED TO, READ BACK BY AND VERIFIED WITH: RN DEE FRANCKS ON 11/06/23 @ 1220 BY DRT (NOTE) The GeneXpert MRSA Assay (FDA approved for NASAL specimens only), is one component of a comprehensive MRSA colonization surveillance program. It is not intended to diagnose MRSA infection nor to guide or monitor treatment for MRSA infections. Test performance is not FDA approved in patients less than 72 years old. Performed at [facility] Lab, [address], [place], [name] [address]   Culture, blood (Routine X 2) w Reflex to ID Panel     Status: None   Collection Time: 11/21/23 12:35 PM   Specimen: BLOOD  Result Value Ref Range Status   Specimen Description BLOOD SITE NOT SPECIFIED  Final   Special Requests   Final    BOTTLES DRAWN AEROBIC AND ANAEROBIC Blood Culture adequate volume   Culture   Final    NO GROWTH 5 DAYS Performed at [facility], [address], [place], [name] [address]    Report Status 11/26/2023 FINAL  Final  Culture, blood (Routine X 2) w Reflex to ID Panel     Status: None   Collection Time: 11/21/23 12:45 PM   Specimen: BLOOD  Result Value Ref Range Status   Specimen Description BLOOD SITE NOT SPECIFIED  Final   Special Requests   Final    BOTTLES DRAWN AEROBIC AND ANAEROBIC Blood Culture adequate volume   Culture   Final    NO GROWTH 5 DAYS Performed at [facility], [address], [place], [name] [address]    Report Status 11/26/2023 FINAL  Final  Urine Culture (for pregnant, neutropenic or urologic patients or patients with an indwelling urinary  catheter)     Status: None   Collection Time: 11/21/23 11:25 PM   Specimen: Urine, Catheterized  Result Value Ref Range Status   Specimen Description URINE, CATHETERIZED  Final   Special Requests NONE  Final   Culture   Final    NO GROWTH Performed at [facility], [address], [place], [name] [address]    Report Status 11/23/2023 FINAL  Final  Culture, blood (Routine X 2) w Reflex to ID Panel     Status: None   Collection Time: 11/26/23 10:24 AM   Specimen: BLOOD LEFT ARM  Result Value Ref Range Status   Specimen Description BLOOD LEFT ARM  Final   Special Requests   Final    BOTTLES DRAWN AEROBIC ONLY Blood Culture results may not be optimal due to an inadequate volume of blood received in culture bottles   Culture   Final    NO GROWTH 5 DAYS Performed at [facility], [address] [place], [name] [address]    Report Status 12/01/2023 FINAL  Final  Culture, blood (Routine X 2) w Reflex to ID Panel     Status: None   Collection Time: 11/26/23 10:24 AM   Specimen: BLOOD LEFT HAND  Result Value Ref Range Status   Specimen Description  BLOOD LEFT HAND  Final   Special Requests   Final    BOTTLES DRAWN AEROBIC ONLY Blood Culture results may not be optimal due to an inadequate volume of blood received in culture bottles   Culture   Final    NO GROWTH 5 DAYS Performed at [facility], [address] [place], [name] [address]    Report Status 12/01/2023 FINAL  Final  Culture, blood (Routine X 2) w Reflex to ID Panel     Status: None   Collection Time: 12/06/23 11:36 AM   Specimen: BLOOD RIGHT ARM  Result Value Ref Range Status   Specimen Description BLOOD RIGHT ARM  Final   Special Requests   Final    BOTTLES DRAWN AEROBIC ONLY Blood Culture results may not be optimal due to an inadequate volume of blood received in culture bottles   Culture   Final    NO GROWTH 5 DAYS Performed at [facility], [address] [place], [name] [address]     Report Status 12/11/2023 FINAL  Final  Culture, blood (Routine X 2) w Reflex to ID Panel     Status: None   Collection Time: 12/06/23 11:37 AM   Specimen: BLOOD LEFT ARM  Result Value Ref Range Status   Specimen Description BLOOD LEFT ARM  Final   Special Requests   Final    BOTTLES DRAWN AEROBIC ONLY Blood Culture results may not be optimal due to an inadequate volume of blood received in culture bottles   Culture   Final    NO GROWTH 5 DAYS Performed at [facility], [address], [place], [name] [address]    Report Status 12/11/2023 FINAL  Final  Culture, Urine (Do not remove urinary catheter, catheter placed by urology or difficult to place)     Status: None   Collection Time: 12/27/23 10:05 AM   Specimen: Urine, Catheterized  Result Value Ref Range Status   Specimen Description URINE, CATHETERIZED  Final   Special Requests NONE  Final   Culture   Final    NO GROWTH Performed at [facility], [address], [place], [name] [address]    Report Status 12/28/2023 FINAL  Final  Remove and replace urinary cath (placed > 5 days) then obtain urine culture from new indwelling urinary catheter.     Status: None   Collection Time: 12/29/23  8:17 AM   Specimen: Urine, Catheterized  Result Value Ref Range Status   Specimen Description URINE, CATHETERIZED  Final   Special Requests NONE  Final   Culture   Final    NO GROWTH Performed at [facility], [address] [place], [name] [address]    Report Status 12/30/2023 FINAL  Final  Respiratory (~20 pathogens) panel by PCR     Status: None   Collection Time: 12/29/23  9:21 AM   Specimen: Nasopharyngeal Swab; Respiratory  Result Value Ref Range Status   Adenovirus NOT DETECTED NOT DETECTED Final   Coronavirus 229E NOT DETECTED NOT DETECTED Final    Comment: (NOTE) The Coronavirus on the Respiratory Panel, DOES NOT test for the novel  Coronavirus (2019 nCoV)    Coronavirus HKU1 NOT DETECTED NOT DETECTED Final    Coronavirus NL63 NOT DETECTED NOT DETECTED Final   Coronavirus OC43 NOT DETECTED NOT DETECTED Final   Metapneumovirus NOT DETECTED NOT DETECTED Final   Rhinovirus / Enterovirus NOT DETECTED NOT DETECTED Final   Influenza A NOT  DETECTED NOT DETECTED Final   Influenza B NOT DETECTED NOT DETECTED Final   Parainfluenza Virus 1 NOT DETECTED NOT DETECTED Final   Parainfluenza Virus 2 NOT DETECTED NOT DETECTED Final   Parainfluenza Virus 3 NOT DETECTED NOT DETECTED Final   Parainfluenza Virus 4 NOT DETECTED NOT DETECTED Final   Respiratory Syncytial Virus NOT DETECTED NOT DETECTED Final   Bordetella pertussis NOT DETECTED NOT DETECTED Final   Bordetella Parapertussis NOT DETECTED NOT DETECTED Final   Chlamydophila pneumoniae NOT DETECTED NOT DETECTED Final   Mycoplasma pneumoniae NOT DETECTED NOT DETECTED Final    Comment: Performed at [facility], [address] [place], [name] [address]  Resp panel by RT-PCR (RSV, Flu A&B, Covid) Anterior Nasal Swab     Status: None   Collection Time: 12/29/23  9:21 AM   Specimen: Anterior Nasal Swab  Result Value Ref Range Status   SARS Coronavirus 2 by RT PCR NEGATIVE NEGATIVE Final   Influenza A by PCR NEGATIVE NEGATIVE Final   Influenza B by PCR NEGATIVE NEGATIVE Final    Comment: (NOTE) The Xpert Xpress SARS-CoV-2/FLU/RSV plus assay is intended as an aid in the diagnosis of influenza from Nasopharyngeal swab specimens and should not be used as a sole basis for treatment. Nasal washings and aspirates are unacceptable for Xpert Xpress SARS-CoV-2/FLU/RSV testing.  Fact Sheet for Patients: BloggerCourse.com  Fact Sheet for Healthcare Providers: SeriousBroker.it  This test is not yet approved or cleared by the [place]  FDA and has been authorized for detection and/or diagnosis of SARS-CoV-2 by FDA under an Emergency Use Authorization (EUA). This EUA will remain in effect (meaning this  test can be used) for the duration of the COVID-19 declaration under Section 564(b)(1) of the Act, 21 U.S.C. section 360bbb-3(b)(1), unless the authorization is terminated or revoked.     Resp Syncytial Virus by PCR NEGATIVE NEGATIVE Final    Comment: (NOTE) Fact Sheet for Patients: BloggerCourse.com  Fact Sheet for Healthcare Providers: SeriousBroker.it  This test is not yet approved or cleared by the [place]  FDA and has been authorized for detection and/or diagnosis of SARS-CoV-2 by FDA under an Emergency Use Authorization (EUA). This EUA will remain in effect (meaning this test can be used) for the duration of the COVID-19 declaration under Section 564(b)(1) of the Act, 21 U.S.C. section 360bbb-3(b)(1), unless the authorization is terminated or revoked.  Performed at [name], [address], [place], [name] [address]   Culture, blood (Routine X 2) w Reflex to ID Panel     Status: None   Collection Time: 12/29/23  9:26 AM   Specimen: BLOOD RIGHT HAND  Result Value Ref Range Status   Specimen Description BLOOD RIGHT HAND  Final   Special Requests   Final    BOTTLES DRAWN AEROBIC AND ANAEROBIC Blood Culture adequate volume   Culture   Final    NO GROWTH 5 DAYS Performed at [facility], [address] [place], [name] [address]    Report Status 01/03/2024 FINAL  Final  Culture, blood (Routine X 2) w Reflex to ID Panel     Status: Abnormal   Collection Time: 12/29/23  9:40 AM   Specimen: BLOOD LEFT HAND  Result Value Ref Range Status   Specimen Description BLOOD LEFT HAND  Final   Special Requests   Final    BOTTLES DRAWN AEROBIC AND ANAEROBIC Blood Culture adequate volume   Culture  Setup Time   Final  GRAM POSITIVE COCCI AEROBIC BOTTLE ONLY CRITICAL RESULT CALLED TO, READ BACK BY AND VERIFIED WITH: PHARMD V BRYK 12/30/2023 @ 2330 BY AB    Culture (A)  Final    STAPHYLOCOCCUS EPIDERMIDIS THE  SIGNIFICANCE OF ISOLATING THIS ORGANISM FROM A SINGLE SET OF BLOOD CULTURES WHEN MULTIPLE SETS ARE DRAWN IS UNCERTAIN. PLEASE NOTIFY THE MICROBIOLOGY DEPARTMENT WITHIN ONE WEEK IF SPECIATION AND SENSITIVITIES ARE REQUIRED. Performed at [facility], [address], [place], [name] [address]    Report Status 01/01/2024 FINAL  Final  Blood Culture ID Panel (Reflexed)     Status: Abnormal   Collection Time: 12/29/23  9:40 AM  Result Value Ref Range Status   Enterococcus faecalis NOT DETECTED NOT DETECTED Final   Enterococcus Faecium NOT DETECTED NOT DETECTED Final   Listeria monocytogenes NOT DETECTED NOT DETECTED Final   Staphylococcus species DETECTED (A) NOT DETECTED Final    Comment: CRITICAL RESULT CALLED TO, READ BACK BY AND VERIFIED WITH: PHARMD V BRYK 12/30/2023 @ 2330 BY AB    Staphylococcus aureus (BCID) NOT DETECTED NOT DETECTED Final   Staphylococcus epidermidis DETECTED (A) NOT DETECTED Final    Comment: Methicillin (oxacillin) resistant coagulase negative staphylococcus. Possible blood culture contaminant (unless isolated from more than one blood culture draw or clinical case suggests pathogenicity). No antibiotic treatment is indicated for blood  culture contaminants. CRITICAL RESULT CALLED TO, READ BACK BY AND VERIFIED WITH: PHARMD V BRYK 12/30/2023 @ 2330 BY AB    Staphylococcus lugdunensis NOT DETECTED NOT DETECTED Final   Streptococcus species NOT DETECTED NOT DETECTED Final   Streptococcus agalactiae NOT DETECTED NOT DETECTED Final   Streptococcus pneumoniae NOT DETECTED NOT DETECTED Final   Streptococcus pyogenes NOT DETECTED NOT DETECTED Final   A.calcoaceticus-baumannii NOT DETECTED NOT DETECTED Final   Bacteroides fragilis NOT DETECTED NOT DETECTED Final   Enterobacterales NOT DETECTED NOT DETECTED Final   Enterobacter cloacae complex NOT DETECTED NOT DETECTED Final   Escherichia coli NOT DETECTED NOT DETECTED Final   Klebsiella aerogenes NOT DETECTED NOT  DETECTED Final   Klebsiella oxytoca NOT DETECTED NOT DETECTED Final   Klebsiella pneumoniae NOT DETECTED NOT DETECTED Final   Proteus species NOT DETECTED NOT DETECTED Final   Salmonella species NOT DETECTED NOT DETECTED Final   Serratia marcescens NOT DETECTED NOT DETECTED Final   Haemophilus influenzae NOT DETECTED NOT DETECTED Final   Neisseria meningitidis NOT DETECTED NOT DETECTED Final   Pseudomonas aeruginosa NOT DETECTED NOT DETECTED Final   Stenotrophomonas maltophilia NOT DETECTED NOT DETECTED Final   Candida albicans NOT DETECTED NOT DETECTED Final   Candida auris NOT DETECTED NOT DETECTED Final   Candida glabrata NOT DETECTED NOT DETECTED Final   Candida krusei NOT DETECTED NOT DETECTED Final   Candida parapsilosis NOT DETECTED NOT DETECTED Final   Candida tropicalis NOT DETECTED NOT DETECTED Final   Cryptococcus neoformans/gattii NOT DETECTED NOT DETECTED Final   Methicillin resistance mecA/C DETECTED (A) NOT DETECTED Final    Comment: CRITICAL RESULT CALLED TO, READ BACK BY AND VERIFIED WITH: PHARMD V BRYK 12/30/2023 @ 2330 BY AB Performed at [facility], [address], [place], [name] [address]   MRSA Next Gen by PCR, Nasal     Status: None   Collection Time: 12/29/23 12:45 PM   Specimen: Nasal Mucosa; Nasal Swab  Result Value Ref Range Status   MRSA by PCR Next Gen NOT DETECTED NOT DETECTED Final    Comment: (NOTE) The GeneXpert MRSA Assay (FDA approved for  NASAL specimens only), is one component of a comprehensive MRSA colonization surveillance program. It is not intended to diagnose MRSA infection nor to guide or monitor treatment for MRSA infections. Test performance is not FDA approved in patients less than 43 years old. Performed at [facility], [address] [place], [name] [address]   Culture, blood (Routine X 2) w Reflex to ID Panel     Status: None   Collection Time: 12/31/23  2:17 AM   Specimen: BLOOD RIGHT ARM  Result Value Ref Range  Status   Specimen Description BLOOD RIGHT ARM  Final   Special Requests   Final    BOTTLES DRAWN AEROBIC AND ANAEROBIC Blood Culture adequate volume   Culture   Final    NO GROWTH 5 DAYS Performed at [facility], [address], [place], [name] [address]    Report Status 01/05/2024 FINAL  Final  Culture, blood (Routine X 2) w Reflex to ID Panel     Status: None   Collection Time: 12/31/23  2:17 AM   Specimen: BLOOD RIGHT HAND  Result Value Ref Range Status   Specimen Description BLOOD RIGHT HAND  Final   Special Requests   Final    BOTTLES DRAWN AEROBIC AND ANAEROBIC Blood Culture adequate volume   Culture   Final    NO GROWTH 5 DAYS Performed at [facility], [address] [place], [name] [address]    Report Status 01/05/2024 FINAL  Final  Culture, blood (Routine X 2) w Reflex to ID Panel     Status: None   Collection Time: 01/02/24  9:34 AM   Specimen: BLOOD  Result Value Ref Range Status   Specimen Description BLOOD SITE NOT SPECIFIED  Final   Special Requests   Final    BOTTLES DRAWN AEROBIC AND ANAEROBIC Blood Culture adequate volume   Culture   Final    NO GROWTH 5 DAYS Performed at [facility], [address], [place], [name] [address]    Report Status 01/07/2024 FINAL  Final  Culture, blood (Routine X 2) w Reflex to ID Panel     Status: Abnormal (Preliminary result)   Collection Time: 01/02/24  9:34 AM   Specimen: BLOOD  Result Value Ref Range Status   Specimen Description BLOOD SITE NOT SPECIFIED  Final   Special Requests   Final    BOTTLES DRAWN AEROBIC AND ANAEROBIC Blood Culture adequate volume   Culture  Setup Time   Final    BUDDING YEAST SEEN IN BOTH AEROBIC AND ANAEROBIC BOTTLES CRITICAL RESULT CALLED TO, READ BACK BY AND VERIFIED WITH: PHARMD E.SINCLAIR AT 0831 ON 01/04/2024 BY T.SAAD.    Culture (A)  Final    CANDIDA GLABRATA Sent to [facility] for further susceptibility testing. Performed at [facility], [address], [place], [name] [address]    Report Status PENDING  Incomplete  Blood Culture ID Panel (Reflexed)     Status: Abnormal   Collection Time: 01/02/24  9:34 AM  Result Value Ref Range Status   Enterococcus faecalis NOT DETECTED NOT DETECTED Final   Enterococcus Faecium NOT DETECTED NOT DETECTED Final   Listeria monocytogenes NOT DETECTED NOT DETECTED Final   Staphylococcus species NOT DETECTED NOT DETECTED Final   Staphylococcus aureus (BCID) NOT DETECTED NOT DETECTED Final   Staphylococcus epidermidis NOT DETECTED NOT DETECTED Final   Staphylococcus lugdunensis NOT DETECTED NOT DETECTED Final   Streptococcus species NOT DETECTED NOT  DETECTED Final   Streptococcus agalactiae NOT DETECTED NOT DETECTED Final   Streptococcus pneumoniae NOT DETECTED NOT DETECTED Final   Streptococcus pyogenes NOT DETECTED NOT DETECTED Final   A.calcoaceticus-baumannii NOT DETECTED NOT DETECTED Final   Bacteroides fragilis NOT DETECTED NOT DETECTED Final   Enterobacterales NOT DETECTED NOT DETECTED Final   Enterobacter cloacae complex NOT DETECTED NOT DETECTED Final   Escherichia coli NOT DETECTED NOT DETECTED Final   Klebsiella aerogenes NOT DETECTED NOT DETECTED Final   Klebsiella oxytoca NOT DETECTED NOT DETECTED Final   Klebsiella pneumoniae NOT DETECTED NOT DETECTED Final   Proteus species NOT DETECTED NOT DETECTED Final   Salmonella species NOT DETECTED NOT DETECTED Final   Serratia marcescens NOT DETECTED NOT DETECTED Final   Haemophilus influenzae NOT DETECTED NOT DETECTED Final   Neisseria meningitidis NOT DETECTED NOT DETECTED Final   Pseudomonas aeruginosa NOT DETECTED NOT DETECTED Final   Stenotrophomonas maltophilia NOT DETECTED NOT DETECTED Final   Candida albicans NOT DETECTED NOT DETECTED Final   Candida auris NOT DETECTED NOT DETECTED Final   Candida glabrata DETECTED (A) NOT DETECTED Final    Comment: CRITICAL RESULT CALLED TO, READ BACK BY AND VERIFIED WITH: PHARMD E.SINCLAIR AT 0831 ON  01/04/2024 BY T.SAAD.    Candida krusei NOT DETECTED NOT DETECTED Final   Candida parapsilosis NOT DETECTED NOT DETECTED Final   Candida tropicalis NOT DETECTED NOT DETECTED Final   Cryptococcus neoformans/gattii NOT DETECTED NOT DETECTED Final    Comment: Performed at [facility], [address], [place], [name] [address]  Culture, blood (Routine X 2) w Reflex to ID Panel     Status: None (Preliminary result)   Collection Time: 01/05/24  3:14 PM   Specimen: BLOOD RIGHT HAND  Result Value Ref Range Status   Specimen Description BLOOD RIGHT HAND  Final   Special Requests   Final    BOTTLES DRAWN AEROBIC ONLY Blood Culture results may not be optimal due to an inadequate volume of blood received in culture bottles   Culture   Final    NO GROWTH 2 DAYS Performed at [facility], [address] [place], [name] [address]    Report Status PENDING  Incomplete  Culture, blood (Routine X 2) w Reflex to ID Panel     Status: None (Preliminary result)   Collection Time: 01/05/24  3:14 PM   Specimen: BLOOD LEFT HAND  Result Value Ref Range Status   Specimen Description BLOOD LEFT HAND  Final   Special Requests   Final    BOTTLES DRAWN AEROBIC ONLY Blood Culture results may not be optimal due to an inadequate volume of blood received in culture bottles   Culture   Final    NO GROWTH 2 DAYS Performed at [facility], [address], [place], [name] [address]    Report Status PENDING  Incomplete    Studies/Results: DG CHEST PORT 1 VIEW Result Date: 01/06/2024 EXAM: 1 VIEW XRAY OF THE CHEST 01/06/2024 06:12:39 AM COMPARISON: AP radiograph of the chest dated 01/02/2024. CLINICAL HISTORY: Cardiac arrhythmia. FINDINGS: LUNGS AND PLEURA: Mildly prominent interstitial opacities, which appear chronic. No focal pulmonary opacity. No pulmonary edema. No pleural effusion. No pneumothorax. HEART AND MEDIASTINUM: The heart is normal in size. Calcification within the aortic arch. No acute  abnormality of the cardiac and mediastinal silhouettes. BONES AND SOFT TISSUES: No acute osseous abnormality. IMPRESSION: 1. No acute findings. 2. Mildly prominent interstitial opacities, which appear chronic. Electronically signed by:  Evalene Coho MD 01/06/2024 06:18 AM EDT RP Workstation: [id number]   DG Abd 1 View Result Date: 01/05/2024 CLINICAL DATA:  Abdominal pain.  Persistent nausea. EXAM: ABDOMEN - 1 VIEW COMPARISON:  CT 12/29/2023 FINDINGS: Scattered air throughout small bowel that is not abnormally dilated, no evidence of obstruction. Air within nondilated colon without significant formed colonic stool. No radiopaque calculi. Brachytherapy seeds in the prostate. The lung bases are clear. IMPRESSION: Nonobstructive bowel gas pattern. Electronically Signed   By: Andrea Gasman M.D.   On: 01/05/2024 16:52   Procedure: I irrigated the catheter with normal saline with minimal return of clot.  I restarted the CBI  Assessment: Gross hematuria  Plan: Continue to wean CBI.  Unfortunately requires ongoing heparin  infusion   Sherwood Edison, MD Urology 01/07/2024, 2:41 PM

## 2024-01-07 NOTE — Progress Notes (Addendum)
 PHARMACY - ANTICOAGULATION CONSULT NOTE  Pharmacy Consult:  Heparin  Indication: LV thrombus  Allergies  Allergen Reactions   Cefepime  Other (See Comments)    Cefepime  neurotoxicity 11/07/23    Patient Measurements: Height: 6' (182.9 cm) Weight: 59.8 kg (131 lb 13.4 oz) IBW/kg (Calculated) : 77.6 HEPARIN  DW (KG): 42.1  Vital Signs: Temp: 97.7 F (36.5 C) (09/07 0316) Temp Source: Oral (09/07 0316) BP: 108/66 (09/07 0100) Pulse Rate: 119 (09/07 0200)  Labs: Recent Labs    01/05/24 0248 01/05/24 1044 01/05/24 1841 01/06/24 0504 01/06/24 1330 01/06/24 1624 01/06/24 2226 01/07/24 0335  HGB 8.5*   < > 6.8* 9.0*   < > 7.5* 7.9* 7.2*  HCT 25.4*   < > 20.3* 26.0*   < > 21.9* 23.2* 20.9*  PLT 198   < > 168 177  --   --   --  156  HEPARINUNFRC 0.49  --   --  0.32  --   --   --  0.23*  CREATININE 6.66*  --   --  7.40*  --   --   --  8.04*   < > = values in this interval not displayed.    Estimated Creatinine Clearance: 7.9 mL/min (A) (by C-G formula based on SCr of 8.04 mg/dL (H)).   Assessment: 76 YOM with LV thrombus off Eliquis  due to hematuria, last dose 8/29 AM.  CBC stable and Pharmacy consulted to dose IV heparin .  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  Heparin  level 0.23 is subtherapeutic on 1100 units/hr. No issues with infusion and hematuria is stable per RN.  Goal of Therapy:  Heparin  level 0.3-0.5 units/ml d/t bleeding risk Monitor platelets by anticoagulation protocol: Yes   Plan:  Increase heparin  1150 units/hr to target lower end of range F/u 6hr heparin  level Monitor daily heparin  level, CBC, signs/symptoms of bleeding  Monitor for hematuria resolution  Jinnie Door, PharmD, BCPS, BCCP Clinical Pharmacist  Please check AMION for all [facility] Pharmacy phone numbers After 10:00 PM, call Main Pharmacy [phone]]

## 2024-01-07 NOTE — Progress Notes (Signed)
 NAME:  Derek Thompson, MRN:  [id number], DOB:  04-Apr-1961, LOS: 65 ADMISSION DATE:  11/02/2023, CONSULTATION DATE:  829 REFERRING MD:  Shawn, CHIEF COMPLAINT:  shock   History of Present Illness:   Mr. 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 His course has been significant for suspected cathter associated UTI s/p IV antibiotics from 7/22-7/27. He also developed AKI on CKD and further work-up revealed multiple myeloma on bone marrow biopsy. In early August he was cleared for hospital discharge but was unable to find an accepting facility and did not have a safe discharge dispo due to homelessness. He was enrolled in the [facility]. On 8/29 he was transferred to the ICU for somnolence, hypotension and new fever with concern for mixed septic and cardiogenic shock. He was briefly vasopressors, started on dobutamine  and resumed on IV Zosyn  with presumed urinary source of infection. He was transferred to the floor on 9/1 and IMTS resumed care 9/2. He subsequently developed new fever, tachycardia and hypotension prompting PCCM consult and return to ICU.   Pertinent  Medical History  Heart failure with reduced EF, baseline in the 20 range, urinary retention secondary to history of prostate cancer with chronic Foley, chronic right lower extremity osteomyelitis, CKD stage IIIb.  Significant Hospital Events: Including procedures, antibiotic start and stop dates in addition to other pertinent events   7/4 Admitted 7/17 Bone marrow biopsy 7/18 s/p R BKA 8/27-8/28 rising BUN and Cr 8/28 new fever, hematuria, AMS and shock requiring NE, echocardiogram showing EF<20% and moderate RV dysfunction, started on  dobutamine   9/3 off pressors and dobutamine , undergoing CBI 9/4 runs of NSVT overnight, stable without pressors, BC + for candida, ID consult and micafungin   9/5 BUN/creatinine continue to trend up, remained tachycardic 9/6 22 SVT overnight, required 5 mg of IV metoprolol  and converted to sinus tach.  BUN/creatinine   Interim History / Subjective:  No overnight issues, remained afebrile BUN/creatinine continue to trend up Still on CBI with pinkish urine  Objective    Blood pressure 112/72, pulse (!) 120, temperature (!) 97.4 F (36.3 C), temperature source Axillary, resp. rate 18, height 6' (1.829 m), weight 59.8 kg, SpO2 97%.        Intake/Output Summary (Last 24 hours) at 01/07/2024 0825 Last data filed at 01/07/2024 0600 Gross per 24 hour  Intake 12786.66 ml  Output 84699 ml  Net -2513.34 ml   Filed Weights   01/02/24 0349 01/03/24 0600 01/05/24 0318  Weight: 59 kg 58.1 kg 59.8 kg    Physical exam: General: Acute on chronically ill-appearing male, lying on the bed HEENT: [place]/AT, eyes anicteric.  moist mucus membranes Neuro: Alert, awake following commands, moving all 4 extremities Chest: Coarse breath sounds, no wheezes or rhonchi Heart: Tachycardic, regular rhythm, no murmurs or gallops Abdomen: Soft, nontender, nondistended, bowel sounds present Extremities: Status post right BKA  Labs and images reviewed  Patient Lines/Drains/Airways Status     Active Line/Drains/Airways     Name Placement date Placement time Site Days   Peripheral IV 12/14/23 22 G 1 Left;Lateral;Posterior Forearm 12/14/23  0747  Forearm  24   Peripheral IV 01/04/24 22 G 2.5 Anterior;Left Forearm 01/04/24  1132  Forearm  3   Peripheral IV 01/05/24 22 G Left;Posterior Hand 01/05/24  1622  Hand  2   Urethral Catheter Anna Fimpong,RN  24 Fr. 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 Prior history of prostate cancer s/p radiation Acute blood loss anemia on anemia of chronic disease Acute on chronic hyponatremia in the setting of CBI and CHF Diabetes type 2 with steroid-induced hyperglycemia Chronic osteomyelitis of right lower extremity status post BKA Severe protein calorie malnutrition  Patient is requiring vasopressor support on and off, currently his blood pressure is stable not on vasopressor support Continue IV ceftriaxone  to complete 2 weeks therapy, ending date 9/12 Repeat blood cultures are negative, continue micafungin  with end date 9/19 ID is following Patient will need outpatient eye exam Continue IV heparin  infusion Advanced heart failure team is following No more episodes of SVT in last 24 hours, still in sinus tach LFTs have improved Serum creatinine continued to get worse with BUN in 140s, patient would likely end up on short-term hemodialysis in next few days Serum bicarbonate continue to trend down Denies nausea and vomiting  Change Reglan  to as needed Received dexamethasone  on Wednesday 20 mg, now will continue once a week Urology is following Plan to start tapering down on CBI H&H is slowly trending down, currently at 7.2, closely monitor and transfuse if less than 7 Serum sodium is trending down likely due to CBI and CHF Closely monitor electrolytes Patient blood sugars are in 70s now as he is not eating much, long-acting insulin  was discontinued Continue sliding scale insulin  Patient may need cortrak, will reevaluate  tomorrow  Prognosis guarded, considering multisystem organ dysfunction Palliative care is following   Labs   CBC: Recent Labs  Lab 01/02/24 1509 01/03/24 0500 01/05/24 0248 01/05/24 1044 01/05/24 1841 01/06/24 0504 01/06/24 1330 01/06/24 1624 01/06/24 2226 01/07/24 0335  WBC 14.6*   < > 17.1* 32.3* 26.9* 17.8*  --   --   --  19.6*  NEUTROABS 13.5*  --   --   --   --   --   --   --   --   --   HGB 10.2*   < > 8.5* 8.7* 6.8* 9.0* 7.2* 7.5* 7.9* 7.2*  HCT 31.0*   < > 25.4* 26.6* 20.3* 26.0* 21.4* 21.9* 23.2* 20.9*  MCV 84.9   < > 84.7 86.9 85.3 85.8  --   --   --  85.7  PLT 251   < > 198 218 168 177  --   --   --  156   < > = values in this interval not displayed.    Basic Metabolic Panel: Recent Labs  Lab 01/01/24 0457 01/02/24 9367 01/02/24 1509 01/02/24 1849 01/03/24 0500 01/04/24 0224 01/05/24 0248 01/06/24 0504 01/07/24 0335  NA 126* 129*   < >  --  125* 129* 125* 124* 123*  K 4.1 4.3   < >  --  4.5 4.5 4.3 5.1 4.6  CL 91* 92*   < >  --  93* 93* 90* 86* 87*  CO2 22 21*   < >  --  17* 18* 20* 21* 19*  GLUCOSE 129* 110*   < >  --  81 197* 317* 171* 71  BUN 112* 100*   < >  --  106* 114* 130* 139* 144*  CREATININE 5.02* 4.88*   < >  --  5.60* 6.03* 6.66* 7.40* 8.04*  CALCIUM  8.3* 8.3*   < >  --  8.1* 8.1* 8.2* 8.3* 8.5*  MG 2.2 2.4  --  2.2  --  2.3 2.3  --   --   PHOS 5.1* 4.6  --   --   --   --  6.1*  --   --    < > = values in this interval not displayed.   GFR: Estimated Creatinine Clearance: 7.9 mL/min (A) (by C-G formula based on SCr of 8.04 mg/dL (H)). Recent Labs  Lab 01/02/24 0632 01/02/24 1509 01/02/24 1639 01/03/24 0500 01/05/24 1044 01/05/24 1841 01/06/24 0504 01/07/24 0335  PROCALCITON 3.29  --   --   --   --   --   --   --   WBC 8.3   < >  --    < > 32.3* 26.9* 17.8* 19.6*  LATICACIDVEN  --   --  0.9  --   --   --   --   --    < > = values in this interval not displayed.    Liver Function Tests: Recent Labs  Lab 01/02/24 1509  01/06/24 0504  AST 178* 27  ALT 372* 114*  ALKPHOS 43 63  BILITOT 0.6 0.8  PROT 7.4 6.8  ALBUMIN 1.8* 1.8*   Recent Labs  Lab 01/05/24 1841  LIPASE 25   No results for input(s): AMMONIA in the last 168 hours.  ABG    Component Value Date/Time   PHART 7.488 (H) 12/29/2023 1620   PCO2ART 20.9 (L) 12/29/2023 1620   PO2ART 98 12/29/2023 1620   HCO3 15.9 (L) 12/29/2023 1620   TCO2 17 (L) 12/29/2023 1620   ACIDBASEDEF 7.0 (H) 12/29/2023 1620   O2SAT 61 01/04/2024 0544     Coagulation Profile: No results for input(s): INR, PROTIME in the last 168 hours.  Cardiac Enzymes: No results for input(s): CKTOTAL, CKMB, CKMBINDEX, TROPONINI in the last 168 hours.   HbA1C: Hgb A1c MFr Bld  Date/Time Value Ref Range Status  09/02/2023 06:15 AM 8.7 (H) 4.8 - 5.6 % Final    Comment:    (NOTE) Pre diabetes:          5.7%-6.4%  Diabetes:              >6.4%  Glycemic control for   <7.0% adults with diabetes   06/13/2023 07:40 PM 13.3 (H) 4.8 - 5.6 % Final    Comment:    (NOTE) Pre diabetes:          5.7%-6.4%  Diabetes:              >6.4%  Glycemic control for   <7.0% adults with diabetes     CBG: Recent Labs  Lab 01/06/24 1203 01/06/24 1635 01/06/24 1920 01/06/24 2314 01/07/24 0316  GLUCAP 221* 185* 231* 76 73     The patient is critically ill due to Shock/AKI/Sepsis.  Critical care was necessary to treat or prevent imminent or life-threatening deterioration.  Critical care was time spent personally by me on the following activities: development of treatment plan with patient and/or surrogate as well as nursing, discussions with consultants, evaluation of patient's response to treatment, examination of patient, obtaining history from patient or surrogate, ordering and performing treatments and interventions, ordering and review of laboratory studies, ordering and review of radiographic studies, pulse oximetry, re-evaluation of patient's condition and  participation in multidisciplinary rounds.   During this encounter critical care time was devoted to patient care services described in this note for 35 minutes.     Valinda Novas, MD [facility] See Amion for pager If no response to pager, please call [phone] until 7pm After 7pm, Please call E-link [phone]

## 2024-01-08 DIAGNOSIS — C9000 Multiple myeloma not having achieved remission: Secondary | ICD-10-CM

## 2024-01-08 DIAGNOSIS — B377 Candidal sepsis: Secondary | ICD-10-CM | POA: Diagnosis not present

## 2024-01-08 DIAGNOSIS — A419 Sepsis, unspecified organism: Secondary | ICD-10-CM | POA: Diagnosis not present

## 2024-01-08 DIAGNOSIS — I5022 Chronic systolic (congestive) heart failure: Secondary | ICD-10-CM

## 2024-01-08 DIAGNOSIS — N3001 Acute cystitis with hematuria: Secondary | ICD-10-CM | POA: Diagnosis not present

## 2024-01-08 DIAGNOSIS — Z89511 Acquired absence of right leg below knee: Secondary | ICD-10-CM | POA: Diagnosis not present

## 2024-01-08 DIAGNOSIS — E8809 Other disorders of plasma-protein metabolism, not elsewhere classified: Secondary | ICD-10-CM | POA: Diagnosis not present

## 2024-01-08 DIAGNOSIS — E162 Hypoglycemia, unspecified: Secondary | ICD-10-CM | POA: Diagnosis not present

## 2024-01-08 DIAGNOSIS — M869 Osteomyelitis, unspecified: Secondary | ICD-10-CM | POA: Diagnosis not present

## 2024-01-08 DIAGNOSIS — I5020 Unspecified systolic (congestive) heart failure: Secondary | ICD-10-CM | POA: Diagnosis not present

## 2024-01-08 DIAGNOSIS — D62 Acute posthemorrhagic anemia: Secondary | ICD-10-CM | POA: Diagnosis not present

## 2024-01-08 LAB — GLUCOSE, CAPILLARY
Glucose-Capillary: 81 mg/dL (ref 70–99)
Glucose-Capillary: 76 mg/dL (ref 70–99)
Glucose-Capillary: 88 mg/dL (ref 70–99)
Glucose-Capillary: 114 mg/dL — ABNORMAL HIGH (ref 70–99)
Glucose-Capillary: 148 mg/dL — ABNORMAL HIGH (ref 70–99)
Glucose-Capillary: 180 mg/dL — ABNORMAL HIGH (ref 70–99)
Glucose-Capillary: 233 mg/dL — ABNORMAL HIGH (ref 70–99)
Glucose-Capillary: 229 mg/dL — ABNORMAL HIGH (ref 70–99)

## 2024-01-08 LAB — COMPREHENSIVE METABOLIC PANEL WITH GFR
ALT: 95 U/L — ABNORMAL HIGH (ref 0–44)
AST: 102 U/L — ABNORMAL HIGH (ref 15–41)
Albumin: 1.6 g/dL — ABNORMAL LOW (ref 3.5–5.0)
Alkaline Phosphatase: 63 U/L (ref 38–126)
Anion gap: 22 — ABNORMAL HIGH (ref 5–15)
BUN: 158 mg/dL — ABNORMAL HIGH (ref 8–23)
CO2: 14 mmol/L — ABNORMAL LOW (ref 22–32)
Calcium: 8.2 mg/dL — ABNORMAL LOW (ref 8.9–10.3)
Chloride: 85 mmol/L — ABNORMAL LOW (ref 98–111)
Creatinine, Ser: 8.92 mg/dL — ABNORMAL HIGH (ref 0.61–1.24)
GFR, Estimated: 6 mL/min — ABNORMAL LOW (ref >60)
Glucose, Bld: 91 mg/dL (ref 70–99)
Potassium: 5.4 mmol/L — ABNORMAL HIGH (ref 3.5–5.1)
Sodium: 121 mmol/L — ABNORMAL LOW (ref 135–145)
Total Bilirubin: 0.9 mg/dL (ref 0.0–1.2)
Total Protein: 6.1 g/dL — ABNORMAL LOW (ref 6.5–8.1)

## 2024-01-08 LAB — CBC
HCT: 18.7 % — ABNORMAL LOW (ref 39.0–52.0)
HCT: 24 % — ABNORMAL LOW (ref 39.0–52.0)
Hemoglobin: 6.4 g/dL — CL (ref 13.0–17.0)
Hemoglobin: 8.1 g/dL — ABNORMAL LOW (ref 13.0–17.0)
MCH: 29.9 pg (ref 26.0–34.0)
MCH: 29.3 pg (ref 26.0–34.0)
MCHC: 34.2 g/dL (ref 30.0–36.0)
MCHC: 33.8 g/dL (ref 30.0–36.0)
MCV: 87.4 fL (ref 80.0–100.0)
MCV: 87 fL (ref 80.0–100.0)
Platelets: 177 K/uL (ref 150–400)
Platelets: 172 K/uL (ref 150–400)
RBC: 2.14 MIL/uL — ABNORMAL LOW (ref 4.22–5.81)
RBC: 2.76 MIL/uL — ABNORMAL LOW (ref 4.22–5.81)
RDW: 18 % — ABNORMAL HIGH (ref 11.5–15.5)
RDW: 17.3 % — ABNORMAL HIGH (ref 11.5–15.5)
WBC: 31.6 K/uL — ABNORMAL HIGH (ref 4.0–10.5)
WBC: 33 K/uL — ABNORMAL HIGH (ref 4.0–10.5)
nRBC: 0.1 % (ref 0.0–0.2)
nRBC: 0.1 % (ref 0.0–0.2)

## 2024-01-08 LAB — MULTIPLE MYELOMA PANEL, SERUM
Albumin SerPl Elph-Mcnc: 2.1 g/dL — ABNORMAL LOW (ref 2.9–4.4)
Albumin/Glob SerPl: 0.5 — ABNORMAL LOW (ref 0.7–1.7)
Alpha 1: 0.5 g/dL — ABNORMAL HIGH (ref 0.0–0.4)
Alpha2 Glob SerPl Elph-Mcnc: 0.9 g/dL (ref 0.4–1.0)
B-Globulin SerPl Elph-Mcnc: 0.7 g/dL (ref 0.7–1.3)
Gamma Glob SerPl Elph-Mcnc: 2.3 g/dL — ABNORMAL HIGH (ref 0.4–1.8)
Globulin, Total: 4.4 g/dL — ABNORMAL HIGH (ref 2.2–3.9)
IgA: 153 mg/dL (ref 61–437)
IgG (Immunoglobin G), Serum: 2652 mg/dL — ABNORMAL HIGH (ref 603–1613)
IgM (Immunoglobulin M), Srm: 40 mg/dL (ref 20–172)
M Protein SerPl Elph-Mcnc: 1.2 g/dL — ABNORMAL HIGH
Total Protein ELP: 6.5 g/dL (ref 6.0–8.5)

## 2024-01-08 LAB — RENAL FUNCTION PANEL
Albumin: 1.7 g/dL — ABNORMAL LOW (ref 3.5–5.0)
Anion gap: 23 — ABNORMAL HIGH (ref 5–15)
BUN: 154 mg/dL — ABNORMAL HIGH (ref 8–23)
CO2: 12 mmol/L — ABNORMAL LOW (ref 22–32)
Calcium: 7.7 mg/dL — ABNORMAL LOW (ref 8.9–10.3)
Chloride: 86 mmol/L — ABNORMAL LOW (ref 98–111)
Creatinine, Ser: 9.05 mg/dL — ABNORMAL HIGH (ref 0.61–1.24)
GFR, Estimated: 6 mL/min — ABNORMAL LOW (ref >60)
Glucose, Bld: 223 mg/dL — ABNORMAL HIGH (ref 70–99)
Phosphorus: 8.4 mg/dL — ABNORMAL HIGH (ref 2.5–4.6)
Potassium: 6.1 mmol/L — ABNORMAL HIGH (ref 3.5–5.1)
Sodium: 121 mmol/L — ABNORMAL LOW (ref 135–145)

## 2024-01-08 LAB — HEPARIN LEVEL (UNFRACTIONATED): Heparin Unfractionated: 0.27 [IU]/mL — ABNORMAL LOW (ref 0.30–0.70)

## 2024-01-08 LAB — PREPARE RBC (CROSSMATCH)

## 2024-01-08 MED ORDER — DEXTROSE-SODIUM CHLORIDE 5-0.9 % IV SOLN
INTRAVENOUS | Status: AC
Start: 2024-01-08 — End: 2024-01-08

## 2024-01-08 MED ORDER — SODIUM ZIRCONIUM CYCLOSILICATE 10 G PO PACK
10.0000 g | PACK | Freq: Once | ORAL | Status: DC
Start: 2024-01-08 — End: 2024-01-08

## 2024-01-08 MED ORDER — DEXTROSE 50 % IV SOLN
1.0000 | Freq: Once | INTRAVENOUS | Status: AC
  Administered 2024-01-08: 50 mL via INTRAVENOUS
  Filled 2024-01-08: qty 50

## 2024-01-08 MED ORDER — CALCIUM GLUCONATE-NACL 1-0.675 GM/50ML-% IV SOLN
1.0000 g | Freq: Once | INTRAVENOUS | Status: AC
Start: 2024-01-08 — End: 2024-01-09
  Administered 2024-01-08: 1000 mg via INTRAVENOUS
  Filled 2024-01-08: qty 50

## 2024-01-08 MED ORDER — SODIUM CHLORIDE 0.9% IV SOLUTION: Freq: Once | INTRAVENOUS | Status: AC

## 2024-01-08 MED ORDER — DEXTROSE-SODIUM CHLORIDE 5-0.9 % IV SOLN: INTRAVENOUS | Status: DC

## 2024-01-08 MED ORDER — INSULIN ASPART 100 UNIT/ML IV SOLN
10.0000 [IU] | Freq: Once | INTRAVENOUS | Status: AC
  Administered 2024-01-08: 10 [IU] via INTRAVENOUS

## 2024-01-08 MED ORDER — SODIUM BICARBONATE 8.4 % IV SOLN
50.0000 meq | Freq: Once | INTRAVENOUS | Status: AC
  Administered 2024-01-08: 50 meq via INTRAVENOUS
  Filled 2024-01-08: qty 50

## 2024-01-08 MED ORDER — SODIUM ZIRCONIUM CYCLOSILICATE 10 G PO PACK
10.0000 g | PACK | Freq: Two times a day (BID) | ORAL | Status: DC
  Administered 2024-01-08 – 2024-01-09 (×3): 10 g via ORAL
  Filled 2024-01-08 (×3): qty 1

## 2024-01-08 NOTE — Progress Notes (Signed)
 Patient ID: Machael Raine, male   DOB: [birth year], 63 y.o.   MRN: [id number]    Progress Note from the Palliative Medicine Team at [facility]   Patient Name: Kani Cannen Dupras        Date: 01/08/2024 DOB: [birth year]  Age: 63 y.o. MRN#: [id number] Attending Physician: Gretta Leita SQUIBB, DO Primary Care Physician: Elicia Sharper, DO Admit Date: 11/02/2023   Reason for Consultation/Follow-up   Establishing Goals of Care   HPI/ Brief Hospital Review  63 y.o. male  with past medical history of end-stage HFrEF, CKD stage 3b, prior CVA, chronic osteomyelitis of the right foot, prior history of prostate cancer s/p radiation, and bladder outlet obstruction with chronic Foley who presented to the ED on 11/02/23 with generalized weakness and was admitted with sepsis secondary to progressive right foot osteomyelitis and presumed UTI, also with AKI.  In early August, patient was medically stable for discharge but did not have a safe disposition due to homelessness.  He was enrolled in the [facility].   Palliative Medicine was re-consulted on 9/3 for goals of care discussions.    LOS is 67 days.   Significant Hospital events: 7/4  - admitted 7/6 - initial palliative consult, goals clear for full code/full scope 7/17 -  bone marrow biopsy concerning for multiple myeloma 7/18 - status post Right BKA 8/27 - worsening renal function 8/29 - new fever, hematuria, AMS, and shock requiring pressor support, echocardiogram showing EF less than 20% and moderate RV dysfunction, started on dobutamine , transferred to ICU 9/3 - off pressors and dobutamine , undergoing CBI 9/4 runs of NSVT overnight, stable without pressors, BC + for candida, ID consult and micafungin    Subjective  Extensive chart review has been completed prior to meeting with patient/family  including labs, vital signs, imaging, progress/consult notes, orders, medications and available advance directive documents.    This NP assessed  patient at the bedside as a follow up for palliative medicine needs and emotional supports  I spoke to patient's mother by telephone.  She tells me she and patient's brother are traveling and on their way to [place].  Mother has not seen patient since July    Questions and concerns addressed     Discussed with primary team and nursing staff   Time: 50  minutes  Detailed review of medical records ( labs, imaging, vital signs), medically appropriate exam ( MS, skin, cardiac,  resp)   discussed with treatment team, counseling and education to patient, family, staff, documenting clinical information, medication management, coordination of care    [name] Plants NP  Palliative Medicine Team Team Phone # [phone] Pager [phone]

## 2024-01-08 NOTE — Progress Notes (Signed)
 [facility]  Date of Admission:  11/02/2023      Total days of antibiotics   Micafungin  9/04 >> c  Ceftriaxone  9/04 >> c   Zosyn  8/29 > 9/03  Meropenem  9/03 > 9/04        ASSESSMENT: Derek Thompson is a 63 y.o. male admitted with:    Candidemia, Candida Glabratta -  CV Line in place -  LV Thrombus -  BCx (+) 9/02 in 1 of 4 bottles. BCx (-) 9/05 Yeast in urine, with large burden of blood in urine as well. He has a chronic LV thrombus - would have some degree of concern this is now infected, will see if we have any re-growth on repeat cultures. We asked PCCM team to consider removing CV line if they can afford that.  - continue micafungin , planning 2 week course for nosocomial onset and likely line related. - would prefer some surveillance cultures given the risk of chronic LV thrombus  - FU susceptibles  - Non-urgent eye exam in time - no need to have opthal come here now   Urinary Tract Infection -  Prostate Cancer, Grade 5, S/P Radiation and ADT 2022 -  Hematuria / radiation cystitis -  Finish out 2 weeks of abx with Ceftriaxone , EOT 9/12   Multiple Myeloma -  New diagnosis - oncology started dexamethasone  20 mg PO/IV weekly (last 9/3).  Would suspect this is contributing to leukocytosis    AKI -  [place] -  Nephrology following. Ongoing discussions about GOC with family coming in   Lab Results  Component Value Date   CREATININE 8.92 (H) 01/08/2024   CREATININE 8.47 (H) 01/07/2024   CREATININE 8.04 (H) 01/07/2024   CHF -  EF 13% now   PLAN: Continue micafungin  through 2 weeks from 9/5 through 9/19 Would do surveillance cultures 7-10 days after completing treatment  Continue ceftriaxone  through 9/12 Follow fever curve -  Leukocytosis expected as r/t steroids FU GOC    Principal Problem:   Osteomyelitis of right foot (HCC) Active Problems:   Chronic systolic heart failure (HCC)   CAD (coronary artery disease)    Hyperkalemia   AKI (acute kidney injury) (HCC)   Anemia   Severe sepsis with septic shock (HCC)   Acute thrombus of left ventricle (HCC)   Gangrene of right foot (HCC)   Hyponatremia   HFrEF (heart failure with reduced ejection fraction) (HCC)   Chronic osteomyelitis involving lower leg, right (HCC)   Frailty   Urinary retention   Generalized weakness   Sinus tachycardia   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 (HCC)   Chronic anemia   Hypoalbuminemia   Monoclonal gammopathy   Hx of right BKA (HCC)   Moderate protein-calorie malnutrition (HCC)   Protein-calorie malnutrition, severe   Fungemia    sodium chloride    Intravenous Once   acetaminophen   1,000 mg Oral Q6H   atorvastatin   80 mg Oral Daily   Chlorhexidine  Gluconate Cloth  6 each Topical Daily   dexamethasone  (DECADRON ) injection  20 mg Intravenous Weekly   ezetimibe   10 mg Oral Daily   feeding supplement  237 mL Oral TID BM   Gerhardt's butt cream   Topical BID   insulin  aspart  0-15 Units Subcutaneous Q4H   multivitamin  1 tablet Oral QHS   mupirocin  ointment   Nasal BID   sodium zirconium cyclosilicate   10 g Oral  BID   thiamine   100 mg Oral Daily    SUBJECTIVE: Feeling ok today. Looking forward to family coming in later.   Review of Systems: Review of Systems  Constitutional:  Negative for fever and malaise/fatigue.  Respiratory:  Negative for cough.   Cardiovascular:  Negative for chest pain.  Genitourinary:  Negative for dysuria.  Musculoskeletal:  Negative for back pain and joint pain.  Neurological:  Negative for dizziness.    Allergies  Allergen Reactions   Cefepime  Other (See Comments)    Cefepime  neurotoxicity 11/07/23    OBJECTIVE: Vitals:   01/08/24 0800 01/08/24 0815 01/08/24 0830 01/08/24 0845  BP: (!) 105/55 117/69 116/71 108/67  Pulse: (!) 114 (!) 113    Resp: (!) 23 14 (!) 6 16  Temp:      TempSrc:      SpO2: 96% 100%    Weight:      Height:       Body mass  index is 17.88 kg/m.  Physical Exam Constitutional:      Appearance: Normal appearance. He is not ill-appearing.  HENT:     Head: Normocephalic.     Mouth/Throat:     Mouth: Mucous membranes are moist.     Pharynx: Oropharynx is clear.  Eyes:     General: No scleral icterus. Cardiovascular:     Rate and Rhythm: Normal rate.  Pulmonary:     Effort: Pulmonary effort is normal.  Musculoskeletal:        General: Normal range of motion.     Cervical back: Normal range of motion.  Skin:    Coloration: Skin is not jaundiced or pale.  Neurological:     Mental Status: He is alert and oriented to person, place, and time.  Psychiatric:        Mood and Affect: Mood normal.        Judgment: Judgment normal.     Lab Results Lab Results  Component Value Date   WBC 33.0 (H) 01/08/2024   HGB 8.1 (L) 01/08/2024   HCT 24.0 (L) 01/08/2024   MCV 87.0 01/08/2024   PLT 172 01/08/2024    Lab Results  Component Value Date   CREATININE 8.92 (H) 01/08/2024   BUN 158 (H) 01/08/2024   NA 121 (L) 01/08/2024   K 5.4 (H) 01/08/2024   CL 85 (L) 01/08/2024   CO2 14 (L) 01/08/2024    Lab Results  Component Value Date   ALT 95 (H) 01/08/2024   AST 102 (H) 01/08/2024   ALKPHOS 63 01/08/2024   BILITOT 0.9 01/08/2024     Microbiology: Recent Results (from the past 240 hours)  MRSA Next Gen by PCR, Nasal     Status: None   Collection Time: 12/29/23 12:45 PM   Specimen: Nasal Mucosa; Nasal Swab  Result Value Ref Range Status   MRSA by PCR Next Gen NOT DETECTED NOT DETECTED Final    Comment: (NOTE) The GeneXpert MRSA Assay (FDA approved for NASAL specimens only), is one component of a comprehensive MRSA colonization surveillance program. It is not intended to diagnose MRSA infection nor to guide or monitor treatment for MRSA infections. Test performance is not FDA approved in patients less than 20 years old. Performed at [facility], [address], [place], [name] [address]    Culture, blood (Routine X 2) w Reflex to ID Panel     Status: None   Collection Time: 12/31/23  2:17 AM   Specimen: BLOOD RIGHT ARM  Result Value Ref Range Status   Specimen Description BLOOD RIGHT ARM  Final   Special Requests   Final    BOTTLES DRAWN AEROBIC AND ANAEROBIC Blood Culture adequate volume   Culture   Final    NO GROWTH 5 DAYS Performed at [facility], [address], [place], [name] [address]    Report Status 01/05/2024 FINAL  Final  Culture, blood (Routine X 2) w Reflex to ID Panel     Status: None   Collection Time: 12/31/23  2:17 AM   Specimen: BLOOD RIGHT HAND  Result Value Ref Range Status   Specimen Description BLOOD RIGHT HAND  Final   Special Requests   Final    BOTTLES DRAWN AEROBIC AND ANAEROBIC Blood Culture adequate volume   Culture   Final    NO GROWTH 5 DAYS Performed at [facility], [address], [place], [name] [address]    Report Status 01/05/2024 FINAL  Final  Culture, blood (Routine X 2) w Reflex to ID Panel     Status: None   Collection Time: 01/02/24  9:34 AM   Specimen: BLOOD  Result Value Ref Range Status   Specimen Description BLOOD SITE NOT SPECIFIED  Final   Special Requests   Final    BOTTLES DRAWN AEROBIC AND ANAEROBIC Blood Culture adequate volume   Culture   Final    NO GROWTH 5 DAYS Performed at [facility], [address] [place], [name] [address]    Report Status 01/07/2024 FINAL  Final  Culture, blood (Routine X 2) w Reflex to ID Panel     Status: Abnormal (Preliminary result)   Collection Time: 01/02/24  9:34 AM   Specimen: BLOOD  Result Value Ref Range Status   Specimen Description BLOOD SITE NOT SPECIFIED  Final   Special Requests   Final    BOTTLES DRAWN AEROBIC AND ANAEROBIC Blood Culture adequate volume   Culture  Setup Time   Final    BUDDING YEAST SEEN IN BOTH AEROBIC AND ANAEROBIC BOTTLES CRITICAL RESULT CALLED TO, READ BACK BY AND VERIFIED WITH: PHARMD E.SINCLAIR AT 0831 ON 01/04/2024  BY T.SAAD.    Culture (A)  Final    CANDIDA GLABRATA Sent to [facility] for further susceptibility testing. Performed at [facility], [address], [place], [name] [address]    Report Status PENDING  Incomplete  Blood Culture ID Panel (Reflexed)     Status: Abnormal   Collection Time: 01/02/24  9:34 AM  Result Value Ref Range Status   Enterococcus faecalis NOT DETECTED NOT DETECTED Final   Enterococcus Faecium NOT DETECTED NOT DETECTED Final   Listeria monocytogenes NOT DETECTED NOT DETECTED Final   Staphylococcus species NOT DETECTED NOT DETECTED Final   Staphylococcus aureus (BCID) NOT DETECTED NOT DETECTED Final   Staphylococcus epidermidis NOT DETECTED NOT DETECTED Final   Staphylococcus lugdunensis NOT DETECTED NOT DETECTED Final   Streptococcus species NOT DETECTED NOT DETECTED Final   Streptococcus agalactiae NOT DETECTED NOT DETECTED Final   Streptococcus pneumoniae NOT DETECTED NOT DETECTED Final   Streptococcus pyogenes NOT DETECTED NOT DETECTED Final   A.calcoaceticus-baumannii NOT DETECTED NOT DETECTED Final   Bacteroides fragilis NOT DETECTED NOT DETECTED Final   Enterobacterales NOT DETECTED NOT DETECTED Final   Enterobacter cloacae complex NOT DETECTED NOT DETECTED Final   Escherichia coli NOT DETECTED NOT DETECTED Final   Klebsiella aerogenes NOT DETECTED NOT DETECTED Final   Klebsiella oxytoca NOT DETECTED NOT DETECTED  Final   Klebsiella pneumoniae NOT DETECTED NOT DETECTED Final   Proteus species NOT DETECTED NOT DETECTED Final   Salmonella species NOT DETECTED NOT DETECTED Final   Serratia marcescens NOT DETECTED NOT DETECTED Final   Haemophilus influenzae NOT DETECTED NOT DETECTED Final   Neisseria meningitidis NOT DETECTED NOT DETECTED Final   Pseudomonas aeruginosa NOT DETECTED NOT DETECTED Final   Stenotrophomonas maltophilia NOT DETECTED NOT DETECTED Final   Candida albicans NOT DETECTED NOT DETECTED Final   Candida auris NOT DETECTED NOT DETECTED  Final   Candida glabrata DETECTED (A) NOT DETECTED Final    Comment: CRITICAL RESULT CALLED TO, READ BACK BY AND VERIFIED WITH: PHARMD E.SINCLAIR AT 0831 ON 01/04/2024 BY T.SAAD.    Candida krusei NOT DETECTED NOT DETECTED Final   Candida parapsilosis NOT DETECTED NOT DETECTED Final   Candida tropicalis NOT DETECTED NOT DETECTED Final   Cryptococcus neoformans/gattii NOT DETECTED NOT DETECTED Final    Comment: Performed at [name], [address], [place], [name] [address]  Culture, blood (Routine X 2) w Reflex to ID Panel     Status: None (Preliminary result)   Collection Time: 01/05/24  3:14 PM   Specimen: BLOOD RIGHT HAND  Result Value Ref Range Status   Specimen Description BLOOD RIGHT HAND  Final   Special Requests   Final    BOTTLES DRAWN AEROBIC ONLY Blood Culture results may not be optimal due to an inadequate volume of blood received in culture bottles   Culture   Final    NO GROWTH 3 DAYS Performed at [facility], [address], [place], [name] [address]    Report Status PENDING  Incomplete  Culture, blood (Routine X 2) w Reflex to ID Panel     Status: None (Preliminary result)   Collection Time: 01/05/24  3:14 PM   Specimen: BLOOD LEFT HAND  Result Value Ref Range Status   Specimen Description BLOOD LEFT HAND  Final   Special Requests   Final    BOTTLES DRAWN AEROBIC ONLY Blood Culture results may not be optimal due to an inadequate volume of blood received in culture bottles   Culture   Final    NO GROWTH 3 DAYS Performed at [facility], [address], [place], [name] [address]    Report Status PENDING  Incomplete    Corean Fireman, MSN, NP-C [facility]  [email or web address] Pager: [phone] Office: [phone] [facility] Main Line: [phone] *Secure Chat Communication Welcome  Total Encounter Time: 15 min

## 2024-01-08 NOTE — Progress Notes (Signed)
 eLink Physician-Brief Progress Note Patient Name: Derek Thompson DOB: [birth year] MRN: [id number]   Date of Service  01/08/2024  HPI/Events of Note  Potassium 6.1, bicarb 12 on labs resulted.   eICU Interventions  Placed orders for insulin +dextrose , bicarb, lokelma , calcium  gluconate to be given per hyperkalemia protocol.  Will continue to follow serial K.         Myrle Wanek M DELA CRUZ 01/08/2024, 10:34 PM

## 2024-01-08 NOTE — Progress Notes (Signed)
 eLink Physician-Brief Progress Note Patient Name: Derek Thompson DOB: 12/17/1960 MRN: [id number]   Date of Service  01/08/2024  HPI/Events of Note  Hgb 6.4  eICU Interventions  Transfuse 1 unit PRBC     Intervention Category Intermediate Interventions: Other:  [name], P 01/08/2024, 1:24 AM

## 2024-01-08 NOTE — Progress Notes (Addendum)
 NAME:  Derek Thompson, MRN:  [id number], DOB:  02-08-1961, LOS: 66 ADMISSION DATE:  11/02/2023, CONSULTATION DATE:  829 REFERRING MD:  Shawn, CHIEF COMPLAINT:  shock   History of Present Illness:   Mr. 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 His course has been significant for suspected cathter associated UTI s/p IV antibiotics from 7/22-7/27. He also developed AKI on CKD and further work-up revealed multiple myeloma on bone marrow biopsy. In early August he was cleared for hospital discharge but was unable to find an accepting facility and did not have a safe discharge dispo due to homelessness. He was enrolled in the [facility]. On 8/29 he was transferred to the ICU for somnolence, hypotension and new fever with concern for mixed septic and cardiogenic shock. He was briefly vasopressors, started on dobutamine  and resumed on IV Zosyn  with presumed urinary source of infection. He was transferred to the floor on 9/1 and IMTS resumed care 9/2. He subsequently developed new fever, tachycardia and hypotension prompting PCCM consult and return to ICU.   Pertinent  Medical History  Heart failure with reduced EF, baseline in the 20 range, urinary retention secondary to history of prostate cancer with chronic Foley, chronic right lower extremity osteomyelitis, CKD stage IIIb.  Significant Hospital Events: Including procedures, antibiotic start and stop dates in addition to other pertinent events   7/4 Admitted 7/17 Bone marrow biopsy 7/18 s/p R BKA 8/27-8/28 rising BUN and Cr 8/28 new fever, hematuria, AMS and shock requiring NE, echocardiogram showing EF<20% and moderate RV dysfunction, started on  dobutamine   9/3 off pressors and dobutamine , undergoing CBI 9/4 runs of NSVT overnight, stable without pressors, BC + for candida, ID consult and micafungin   9/5 BUN/creatinine continue to trend up, remained tachycardic 9/6 22 SVT overnight, required 5 mg of IV metoprolol  and converted to sinus tach.  BUN/creatinine  9/7 BUN/cr trending up, felt degree of ATN by nephro, starting to get uremic symptoms (asterixis). Off pressors Remained on CBI; for on-going hematuria, still had IV heparin  going for LV thrombus. Back on pressors PM hours for SBP drop to 70s,  9/8 Hgb down to 6.4 (xfuse 1 unit PRBC).  At point where he he would require dialysis, patient wants to discuss this with his family in light of his multiple medical issues.  Interim History / Subjective:  He denies shortness of breath, chest pain, nausea.  Objective    Blood pressure 112/70, pulse (!) 111, temperature (!) 97.4 F (36.3 C), temperature source Oral, resp. rate (!) 21, height 6' (1.829 m), weight 59.8 kg, SpO2 100%.        Intake/Output Summary (Last 24 hours) at 01/08/2024 0800 Last data filed at 01/08/2024 0700 Gross per 24 hour  Intake 3843.07 ml  Output 9350 ml  Net -5506.93 ml   Filed Weights   01/02/24 0349 01/03/24 0600 01/05/24 0318  Weight: 59 kg 58.1 kg 59.8 kg    Physical exam: General This is a pleasant 63 year old male patient currently sitting in bed he is in no acute distress this morning HEENT normocephalic atraumatic, he does have notable jugular venous distention sitting in the upright position Pulmonary clear to auscultation currently room air no accessory use  Cardiac tachycardic rhythm no audible murmur POCUS: The IVC is distended without respiratory variation Abdomen soft nontender Extremity left lower extremity warm dry brisk cap refill no edema, the postoperative stump on the right side is without redness, swelling, or concern for infection Neuro he is awake and oriented x 3, does have some  mild asterixis with bilateral arm raise. GU pink urine from CBI Resolved problem list  Concern for uremia as he is complaining of persistent nausea and vomiting Septic shock  Assessment and Plan    Shock. Initially recurrent sepsis from bilateral multifocal pneumonia, acute urinary tract infection and Candida glabrata fungemia. Suspect element of hypovolemia from on-going blood loss and his underlying biventricular HFrEF also playing a role now as well as progressive acidosis  Plan Ensure euvolemia Transfuse 1 unit, this is completed we will follow-up hemoglobin Ensure MAP > 65 titrate Norepi as needed Ck ionized Ca If we are going to continue full scope of care will need HD cath for CRRT Hold antihypertensives If CBI output worsens (increased bloody output we may need to consider holding his heparin ) Cont micafungin  for 2 weeks (stop date planned for 8/29); f/u blood cultures from 5t NGTD thus far Would need outpt optho eval  Cont CTX for 2 week course (9/12)   h/o prostate cancer s/p XRT (remote) w/ ongoing hematuria Felt 2/2 Radiation cystitis Plan Cont CBI Needs cysto at some point if we are going to continue care Trend CBCs Additional rec per urology Would love to stop heparin    LV thrombus (initial dx feb 2025) ECHO 8/29 still suggesting soft clot Plan Repeat TTE today if continuing aggressive care  If negative stop heparin   Paroxysmal SVT Plan Tele  Worsening AKI on CKD stage IIIb w/Metabolic acidosis and progressive uremia & hyperkalemia   Suspect mixed picture etiology initially mix of shock and post-obstruction now ATN Poor candidate for long term hd doubt ever PD candidate Told nephro wants to talk to mother prior to [facility] consideration Plan Await mothers arrival  Renal dose meds If pt decides he wants to proceed will need CRRT Lokelma  X 1 Q 5 and 5 renal profiles  Acute on chronic hyponatremia in the setting of his Multiple myeloma (so to some degree  pseudohyponatremia) c/b CBI, CHF and renal failure  Has been stable in 120-124 range Plan Keep euvolemic  Trend chems If decides to start CRRT this should improve.  Historically looks like baseline ~ 130 (? Accounting for the MM??)  Multiple myeloma (formal new dx per onc), started on weekly steroid Plan Last seen by onc 9/4 Steroids started 9/3 No plan for chemo at this time given fungemia and multiple organ failure   Acute blood loss anemia on anemia of chronic disease Plan Xfuse today  Trend cbc Trigger remains hgb < 7   Shock liver, also have to consider impact of antifungals Plan Am LFTs  Diabetes type 2 with steroid-induced hyperglycemia Plan Ssi  Goal 140-180  Chronic osteomyelitis of right lower extremity status post BKA Plan Routine wound care  Severe protein calorie malnutrition Plan Nutritional support  RD following  My critical care time is 38 minutes

## 2024-01-08 NOTE — Progress Notes (Addendum)
 [facility] NEPHROLOGY PROGRESS NOTE  Assessment/ Plan: Pt is a 63 y.o. yo male   # AKI on CKD -suspect ATN at this point. Causes are likely related to recent shock/decrease renal perfusion, MGRS, obstruction. Received contrast on 8/29. repeat renal ultrasound still with hydronephrosis-urology following -BUN and Cr continues to rise, now with asterixis/early uremic symptoms.  Also with multiple lab/electrolytes abnormalities and acidosis. - We discussed about starting dialysis after catheter placement however patient stated he would like to wait until his family member comes to the hospital before doing dialysis.  Noted palliative care is planning to meet with his mother tomorrow. Overall, not an ideal long term candidate for HD given his current clinical situation/comorbidities (HFrEF with hypotension, poor functional status-now with BKA, fungemia, untreated MM, bleeding). I do not foresee him being a candidate for PD in the long run at this time given unstable housing situation.  -anticipating HD catheter placement and starting dialysis in the next few days if patient and family desire to proceed with HD.  Discussed with ICU team. -Continue strict Input and Output monitoring. Will monitor the patient closely with you and intervene or adjust therapy as indicated by changes in clinical status/labs    # Septic shock Fungemia Multifocal pneumonia -abx/micofungin per primary service -off pressors now. Keep MAP >65 to ensure adequate renal perfusion -ID following   #Urinary retention Hematuria -urology following, undergoing CBI    #LV thrombus -on heparin  drip   #Multiple Myeloma -diagnosed via bone marrow biopsy. Plan to follow up with oncology OP. Given his acute/current issues along with functional status, I do not foresee him undergoing chemo at this time given his current comorbidities. On dexamethasone  weekly now   #Hyponatremia -likely secondary to pseudohyponatremia  secondary to monoclonal gammopathy and AKI.  Recommend fluid restriction for now.  Anticipate starting dialysis soon with low sodium.   #Acidosis -secondary to AKI, trend for now, stable   #Chronic osteomyelitis s/p right BKA -per primary service   #Anemia -transfuse prn for Hgb <7 -iron  not indicated given sepsis, holding off on ESA for now.  # Hyperkalemia: Order Lokelma  twice daily.  Discussed with ICU team.  Subjective: Seen and examined at bedside.  Patient is alert awake.  Today he denies nausea, vomiting, chest pain, shortness of breath.  He is not wanting to do dialysis today and stated he would like to wait until his family member comes to the hospital. Objective Vital signs in last 24 hours: Vitals:   01/08/24 0630 01/08/24 0645 01/08/24 0700 01/08/24 0751  BP: 109/66 106/67 112/70   Pulse:      Resp: 19 16 (!) 21   Temp:    (!) 97.4 F (36.3 C)  TempSrc:    Oral  SpO2:      Weight:      Height:       Weight change:   Intake/Output Summary (Last 24 hours) at 01/08/2024 0844 Last data filed at 01/08/2024 0700 Gross per 24 hour  Intake 3843.07 ml  Output 9350 ml  Net -5506.93 ml       Labs: RENAL PANEL Recent Labs  Lab 01/02/24 0632 01/02/24 1509 01/02/24 1849 01/03/24 0500 01/04/24 0224 01/05/24 0248 01/06/24 0504 01/07/24 0335 01/07/24 1422 01/08/24 0045  NA 129* 127*  --    < > 129* 125* 124* 123* 120* 121*  K 4.3 4.2  --    < > 4.5 4.3 5.1 4.6 5.4* 5.4*  CL 92* 93*  --    < >  93* 90* 86* 87* 86* 85*  CO2 21* 19*  --    < > 18* 20* 21* 19* 18* 14*  GLUCOSE 110* 107*  --    < > 197* 317* 171* 71 166* 91  BUN 100* 109*  --    < > 114* 130* 139* 144* 150* 158*  CREATININE 4.88* 5.05*  --    < > 6.03* 6.66* 7.40* 8.04* 8.47* 8.92*  CALCIUM  8.3* 8.0*  --    < > 8.1* 8.2* 8.3* 8.5* 8.2* 8.2*  MG 2.4  --  2.2  --  2.3 2.3  --   --   --   --   PHOS 4.6  --   --   --   --  6.1*  --   --   --   --   ALBUMIN  --  1.8*  --   --   --   --  1.8*  --  1.7*  1.6*   < > = values in this interval not displayed.    Liver Function Tests: Recent Labs  Lab 01/06/24 0504 01/07/24 1422 01/08/24 0045  AST 27 43* 102*  ALT 114* 81* 95*  ALKPHOS 63 57 63  BILITOT 0.8 0.7 0.9  PROT 6.8 6.3* 6.1*  ALBUMIN 1.8* 1.7* 1.6*   Recent Labs  Lab 01/05/24 1841  LIPASE 25   No results for input(s): AMMONIA in the last 168 hours. CBC: Recent Labs    06/14/23 0452 06/15/23 0414 06/16/23 0404 10/13/23 9385 10/14/23 9365 11/03/23 0423 11/04/23 0406 01/06/24 1624 01/06/24 2226 01/07/24 0335 01/07/24 1422 01/08/24 0045  HGB  --  8.1*   < > 7.3*   < > 8.1*   < > 7.5* 7.9* 7.2* 7.3* 6.4*  MCV  --  85.4   < > 89.8   < > 85.0   < >  --   --  85.7  --  87.4  VITAMINB12 908 871  --  459  --   --   --   --   --   --   --   --   FOLATE  --  7.2  --  8.5  --   --   --   --   --   --   --   --   FERRITIN 264 222  --   --   --  1,007*  --   --   --   --   --   --   TIBC 161* 150*  --  165*  --   --   --   --   --   --   --   --   IRON  22* 21*  --  18*  --   --   --   --   --   --   --   --   RETICCTPCT  --   --   --  1.3  --   --   --   --   --   --   --   --    < > = values in this interval not displayed.    Cardiac Enzymes: No results for input(s): CKTOTAL, CKMB, CKMBINDEX, TROPONINI in the last 168 hours. CBG: Recent Labs  Lab 01/07/24 2341 01/08/24 0118 01/08/24 0311 01/08/24 0508 01/08/24 0750  GLUCAP 101* 81 76 88 114*    Iron  Studies: No results for input(s): IRON , TIBC, TRANSFERRIN, FERRITIN in the last 72  hours. Studies/Results: No results found.  Medications: Infusions:  sodium chloride      cefTRIAXone  (ROCEPHIN )  IV Stopped (01/07/24 1540)   dextrose  5 % and 0.9 % NaCl 40 mL/hr at 01/08/24 0700   heparin  1,250 Units/hr (01/08/24 0700)   micafungin  (MYCAMINE ) 100 mg in sodium chloride  0.9 % 100 mL IVPB Stopped (01/07/24 1045)   norepinephrine  (LEVOPHED ) Adult infusion 10 mcg/min (01/08/24 0700)   sodium  chloride irrigation      Scheduled Medications:  sodium chloride    Intravenous Once   acetaminophen   1,000 mg Oral Q6H   atorvastatin   80 mg Oral Daily   Chlorhexidine  Gluconate Cloth  6 each Topical Daily   dexamethasone  (DECADRON ) injection  20 mg Intravenous Weekly   ezetimibe   10 mg Oral Daily   feeding supplement  237 mL Oral TID BM   Gerhardt's butt cream   Topical BID   insulin  aspart  0-15 Units Subcutaneous Q4H   multivitamin  1 tablet Oral QHS   mupirocin  ointment   Nasal BID   thiamine   100 mg Oral Daily    have reviewed scheduled and prn medications.  Physical Exam: General:NAD, comfortable Heart:RRR, s1s2 nl Lungs:clear b/l, no crackle Abdomen:soft, Non-tender, non-distended Extremities:No edema, right BKA Neurology: Alert awake and following commands  Derek Thompson 01/08/2024,8:44 AM  LOS: 66 days

## 2024-01-08 NOTE — Progress Notes (Signed)
 Physical Therapy Treatment Patient Details Name: Derek Thompson MRN: [id number] DOB: 02-19-1961 Today's Date: 01/08/2024   History of Present Illness Pt is a 63 y/o M presenting to ED on 11/02/23 with lethargy. Pt found to be tachycardic, hyperkalemic, and hyponatremic. 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 Pt transferred to icu on 8/29. 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    PT Comments  STAR PT/OT Session: Pt lying on his L side with head resting on bed rail on entry. Pt reports it doesn't put so much pressure on his bottom. Rouses with therapy entry, reporting he is agreeable to try to get to EoB. Pt requiring modAx2 for coming to EoB, once there reports increased scrotal and sacral pain in sitting. Pt is agreeable to stand to relieve pain. On first attempt to stand pt is unable to achieve fully upright, but is noted to have had small BM. On second attempt, pt is able to come to fully upright but is not able to stand long enough for pericare. Pt returned to sidelying with modAx2. Pericare completed and clean linens placed with prior to rolling pt over to his R side where he reports he would like to stay. Pt family to arrive tomorrow to discuss GOC. PT/OT will await results of meeting to inform further therapy sessions.     If plan is discharge home, recommend the following: A lot of help with bathing/dressing/bathroom;Supervision due to cognitive status;Help with stairs or ramp for entrance;Assistance with cooking/housework;Assist for transportation;A little help with walking and/or transfers   Can  travel by private vehicle     No  Equipment Recommendations  BSC/3in1;Wheelchair (measurements PT);Wheelchair cushion (measurements PT);Other (comment)       Precautions / Restrictions Precautions Precautions: Fall;Other (comment) Recall of Precautions/Restrictions: Impaired Precaution/Restrictions Comments: Bladder irrigation, central line R BKA- no pillows under knee Required Braces or Orthoses: Other Brace Other Brace: prosthetist request use of limb protector in bed to keep knee extension at night Restrictions Weight Bearing Restrictions Per Provider Order: No RLE Weight Bearing Per Provider Order: Non weight bearing     Mobility  Bed Mobility Overal bed mobility: Needs Assistance Bed Mobility: Rolling, Supine to Sit, Sit to Supine Rolling: Min assist   Supine to sit: Mod assist, +2 for physical assistance Sit to supine: Mod assist, +2 for physical assistance   General bed mobility comments: rolling to address cleaning bottom, required assistance with trunk and BLE to get to EOB and back to supine    Transfers Overall transfer level: Needs assistance Equipment used: Rolling walker (2 wheels) Transfers: Sit to/from Stand Sit to Stand: Max assist, +2 physical assistance           General transfer comment: limited standing tolerance on first attempt and asked to return to EOB, stood a second time with increased time but continues to be limited and unable to tolerate standing for cleaning peri area          Balance Overall balance assessment: Needs assistance Sitting-balance support: Bilateral upper extremity supported, Feet supported, Feet unsupported Sitting balance-Leahy Scale: Fair Sitting balance - Comments: EOB Postural control: Posterior lean Standing balance  support: Bilateral upper extremity supported, During functional activity Standing balance-Leahy Scale: Zero Standing balance comment: requires outside assist to maintain standing balance                             Communication Communication Communication: Impaired Factors Affecting Communication: Difficulty expressing self  Cognition Arousal: Alert Behavior During Therapy: Flat affect                             Following commands: Impaired Following commands impaired: Follows one step commands with increased time    Cueing Cueing Techniques: Verbal cues, Gestural cues, Tactile cues  Exercises Other Exercises Other Exercises: 2 x sit to stand    General Comments General comments (skin integrity, edema, etc.): HR in 120s with standing today      Pertinent Vitals/Pain Pain Assessment Pain Assessment: Faces Faces Pain Scale: Hurts little more Pain Location: scrotum, bottom, generalized Pain Descriptors / Indicators: Grimacing, Discomfort Pain Intervention(s): Limited activity within patient's tolerance, Monitored during session, Repositioned     PT Goals (current goals can now be found in the care plan section) Acute Rehab PT Goals PT Goal Formulation: With patient Time For Goal Achievement: 01/30/24 Potential to Achieve Goals: Fair Progress towards PT goals: Not progressing toward goals - comment    Frequency    Min 2X/week           Co-evaluation PT/OT/SLP Co-Evaluation/Treatment: Yes Reason for Co-Treatment: For patient/therapist safety;To address functional/ADL transfers (decreased functional ability) PT goals addressed during session: Mobility/safety with mobility OT goals addressed during session: ADL's and self-care      AM-PAC PT 6 Clicks Mobility   Outcome Measure  Help needed turning from your back to your side while in a flat bed without using bedrails?: A Little Help needed moving from lying on your back to sitting on the side of a flat bed without using bedrails?: Total Help needed moving to and from a bed to a chair (including a wheelchair)?: Total Help needed standing up from a chair using your arms (e.g.,  wheelchair or bedside chair)?: Total Help needed to walk in hospital room?: Total Help needed climbing 3-5 steps with a railing? : Total 6 Click Score: 8    End of Session   Activity Tolerance: Patient limited by fatigue;No increased pain;Treatment limited secondary to medical complications (Comment) (increased HR) Patient left: with call bell/phone within reach;in bed;with bed alarm set Nurse Communication: Mobility status PT Visit Diagnosis: Unsteadiness on feet (R26.81);Difficulty in walking, not elsewhere classified (R26.2);Other abnormalities of gait and mobility (R26.89);Muscle weakness (generalized) (M62.81) Pain - Right/Left: Right Pain - part of body: Leg     Time: 1209-1237 PT Time Calculation (min) (ACUTE ONLY): 28 min  Charges:    $Therapeutic Activity: 8-22 mins PT General Charges $$ ACUTE PT VISIT: 1 Visit                     Torian Quintero B. [name] PT, DPT Acute Rehabilitation Services Please use secure chat or  Call Office [phone]    [name] Fleeta [facility] 01/08/2024, 2:29 PM

## 2024-01-08 NOTE — Progress Notes (Addendum)
 PHARMACY - ANTICOAGULATION CONSULT NOTE  Pharmacy Consult:  Heparin  Indication: LV thrombus  Allergies  Allergen Reactions   Cefepime  Other (See Comments)    Cefepime  neurotoxicity 11/07/23    Patient Measurements: Height: 6' (182.9 cm) Weight: 59.8 kg (131 lb 13.4 oz) IBW/kg (Calculated) : 77.6 HEPARIN  DW (KG): 42.1  Vital Signs: Temp: 97.4 F (36.3 C) (09/08 0751) Temp Source: Oral (09/08 0751) BP: 108/67 (09/08 0845) Pulse Rate: 113 (09/08 0815)  Labs: Recent Labs    01/07/24 0335 01/07/24 1113 01/07/24 1422 01/07/24 2002 01/08/24 0045 01/08/24 0851  HGB 7.2*  --  7.3*  --  6.4* 8.1*  HCT 20.9*  --  21.9*  --  18.7* 24.0*  PLT 156  --   --   --  177 172  HEPARINUNFRC 0.23* 0.25*  --  0.25*  --  0.27*  CREATININE 8.04*  --  8.47*  --  8.92*  --     Estimated Creatinine Clearance: 7.1 mL/min (A) (by C-G formula based on SCr of 8.92 mg/dL (H)).   Assessment: 37 YOM with LV thrombus off Eliquis  due to hematuria, last dose 8/29 AM.  CBC stable and Pharmacy consulted to dose IV heparin .  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  01/08/24 AM: Heparin  level 0.27, remains subtherapeutic despite increasing to 1250 units/hr overnight. No issues with infusion running noted. Gross hematuria still present in urine. Hgb decreased to 6.4 g/dL requiring blood transfusion this morning. PLTs remain stable at 172. Of note, had been on 1150 units/hr in past with heparin  level on higher end of goal. Discussed with CCM, will not increase dose further at this time. Will target slightly lower goal of 0.2-0.5 units/mL.   Goal of Therapy:  Heparin  level 0.2-0.5 units/ml d/t bleeding risk Monitor platelets by anticoagulation protocol: Yes  Plan:  Continue heparin  at 1250 units/hr, target lower end of range Monitor daily heparin  level, CBC, signs/symptoms of bleeding  Monitor for hematuria  resolution  Thank you for allowing pharmacy to participate in this patient's care,  Morna Breach, PharmD PGY2 Cardiology Pharmacy Resident 01/08/2024 10:08 AM  Please check AMION for all [facility] Pharmacy phone numbers After 10:00 PM, call Main Pharmacy [phone]

## 2024-01-08 NOTE — TOC Progression Note (Signed)
 Transition of Care [facility] - Progression Note    Patient Details  Name: Derek Thompson MRN: [id number] Date of Birth: 02-Sep-1960  Transition of Care [facility]) CM/SW Contact  Isaiah Public, LCSWA Phone Number: 01/08/2024, 5:12 PM  Clinical Narrative:     PT following. Patient in star program. TOC will continue to follow.   Expected Discharge Plan: Skilled Nursing Facility Barriers to Discharge: Insurance Authorization               Expected Discharge Plan and Services In-house Referral: Clinical Social Work   Post Acute Care Choice: Skilled Nursing Facility Living arrangements for the past 2 months: Homeless Shelter                                       Social Drivers of Health (SDOH) Interventions SDOH Screenings   Food Insecurity: No Food Insecurity (11/02/2023)  Housing: High Risk (11/02/2023)  Transportation Needs: Unmet Transportation Needs (11/02/2023)  Utilities: Not At Risk (11/02/2023)  Alcohol Screen: Low Risk  (08/19/2022)  Depression (PHQ2-9): Low Risk  (10/11/2023)  Financial Resource Strain: Low Risk  (08/19/2022)  Tobacco Use: Medium Risk (11/17/2023)    Readmission Risk Interventions     No data to display

## 2024-01-08 NOTE — Progress Notes (Signed)
 Mother and brother not at bedside today. Planning on deferring family meeting with them until tomorrow as previously scheduled with Palliative. Afternoon RFP pending.  Leita SHAUNNA Gaskins, DO 01/08/24 6:52 PM [facility]  For contact information, see Amion. If no response to pager, please call PCCM consult pager. After hours, 7PM- 7AM, please call Elink.

## 2024-01-08 NOTE — Progress Notes (Signed)
 Occupational Therapy Treatment Patient Details Name: Derek Thompson MRN: [id number] DOB: Apr 12, 1961 Today's Date: 01/08/2024   History of present illness Pt is a 63 y/o M presenting to ED on 11/02/23 with lethargy. Pt found to be tachycardic, hyperkalemic, and hyponatremic. 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 Pt transferred to icu on 8/29. 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   OT comments  [facility] OT/PT session: Patient received in supine and appeared fatigued, leaning on left side with head resting on bed rail.  Patient agreeable to OT/PT session and required increased assistance to get to EOB with mod assist +2 and assistance to scoot to EOB. Patient agreeable to attempt standing from EOB to provide peri area cleaning. Patient unable to come to complete stand on first attempt and limited standing tolerance on 2nd attempt. Patient was assisted back to supine and performed rolling in bed to allow for cleaning and linen change.  Discharge recommendations continue to be appropriate.  Acute OT to continue to follow with [facility] program to address established goals.       If plan is discharge home, recommend the following:  A lot of help with walking and/or transfers;A lot of help with bathing/dressing/bathroom;Assistance with cooking/housework;Direct supervision/assist for medications management;Direct supervision/assist for financial management;Assist for transportation;Help with stairs or ramp for entrance   Equipment Recommendations  Other (comment) (defer)    Recommendations for Other Services       Precautions / Restrictions Precautions Precautions: Fall;Other (comment) Recall of Precautions/Restrictions: Impaired Precaution/Restrictions Comments: Bladder irrigation, central line R BKA- no pillows under knee Required Braces or Orthoses: Other Brace Other Brace: prosthetist request use of limb protector in bed to keep knee extension at night Restrictions Weight Bearing Restrictions Per Provider Order: No RLE Weight Bearing Per Provider Order: Non weight bearing Other Position/Activity Restrictions: shrinker       Mobility Bed Mobility Overal bed mobility: Needs Assistance Bed Mobility: Rolling, Supine to Sit, Sit to Supine Rolling: Min assist   Supine to sit: Mod assist, +2 for physical assistance Sit to supine: Mod assist, +2 for physical assistance   General bed mobility comments: rolling to address cleaning bottom, required assistance with trunk and BLE to get to EOB and back to supine    Transfers Overall transfer level: Needs assistance Equipment used: Rolling walker (2 wheels) Transfers: Sit to/from Stand Sit to Stand: Max assist, +2 physical assistance           General transfer comment: limited standing tolerance on first attempt and asked to return to EOB, stood a second time with increased time but continues to be limited and unable to tolerate standing for cleaning peri area     Balance                                           ADL either performed or assessed with clinical judgement   ADL Overall ADL's : Needs assistance/impaired             Lower Body Bathing: Total assistance;Sit to/from stand;Bed  level Lower Body Bathing Details (indicate cue type and reason): stood for cleaning but limited standing tolerance and returne to supine to complete                       General ADL Comments: limited due to fatigue    Extremity/Trunk Assessment              Vision       Perception     Praxis      Communication Communication Communication: Impaired Factors Affecting Communication: Difficulty expressing self   Cognition Arousal: Alert Behavior During Therapy: Flat affect Cognition: Cognition impaired           Executive functioning impairment (select all impairments): Problem solving OT - Cognition Comments: quickly fatigues                 Following commands: Impaired Following commands impaired: Follows one step commands with increased time      Cueing   Cueing Techniques: Verbal cues, Gestural cues, Tactile cues  Exercises      Shoulder Instructions       General Comments increased HR when standing BP at end of session 119/72 (84)    Pertinent Vitals/ Pain       Pain Assessment Pain Assessment: Faces Faces Pain Scale: Hurts little more Pain Location: scrotum, bottom, generalized Pain Descriptors / Indicators: Grimacing, Discomfort Pain Intervention(s): Limited activity within patient's tolerance, Monitored during session, Repositioned  Home Living                                          Prior Functioning/Environment              Frequency  Min 5X/week        Progress Toward Goals  OT Goals(current goals can now be found in the care plan section)  Progress towards OT goals: Not progressing toward goals - comment (limited by fatigue)  Acute Rehab OT Goals Patient Stated Goal: feel better OT Goal Formulation: With patient Time For Goal Achievement: 02/13/24 Potential to Achieve Goals: Fair ADL Goals Pt Will Perform Grooming: with set-up;sitting Pt Will Perform Lower Body Bathing: with min assist;sitting/lateral leans Pt Will Perform Upper Body Dressing: with set-up;sitting Pt Will Perform Lower Body Dressing: with min assist;sitting/lateral leans Pt Will Transfer to Toilet: with mod assist;stand pivot transfer;bedside commode Pt Will Perform Toileting - Clothing Manipulation and hygiene: with min  assist;sitting/lateral leans Pt/caregiver will Perform Home Exercise Program: Increased strength;Both right and left upper extremity;With theraband;With theraputty;Independently;With written HEP provided Additional ADL Goal #1: Pt will be able to follow 1 step commands with >50% accuracy to maximize participation in ADLs  Plan      Co-evaluation    PT/OT/SLP Co-Evaluation/Treatment: Yes Reason for Co-Treatment: For patient/therapist safety;To address functional/ADL transfers (decreased functional ability) PT goals addressed during session: Mobility/safety with mobility OT goals addressed during session: ADL's and self-care      AM-PAC OT 6 Clicks Daily Activity     Outcome Measure   Help from another person eating meals?: None Help from another person taking care of personal grooming?: A Little Help from another person toileting, which includes using toliet, bedpan, or urinal?: Total Help from another person bathing (including washing, rinsing, drying)?: A Lot Help from another person to put on and taking off regular upper body clothing?: A Little Help from another  person to put on and taking off regular lower body clothing?: Total 6 Click Score: 14    End of Session Equipment Utilized During Treatment: Rolling walker (2 wheels)  OT Visit Diagnosis: Unsteadiness on feet (R26.81);Other abnormalities of gait and mobility (R26.89);Muscle weakness (generalized) (M62.81);Other symptoms and signs involving cognitive function Pain - part of body:  (scrotum, bottom)   Activity Tolerance Patient limited by fatigue   Patient Left in bed;with call bell/phone within reach;with bed alarm set   Nurse Communication Mobility status        Time: 8790-8762 OT Time Calculation (min): 28 min  Charges: OT General Charges $OT Visit: 1 Visit OT Treatments $Self Care/Home Management : 8-22 mins  Dick Laine, OTA Acute Rehabilitation Services  Office [phone]   Jeb LITTIE Laine 01/08/2024, 1:38 PM

## 2024-01-08 NOTE — Progress Notes (Signed)
   52 Days Post-Op Subjective: Complains of positional catheter drainage and clot obstruction overnight.  Objective: Vital signs in last 24 hours: Temp:  [97.2 F (36.2 C)-100.6 F (38.1 C)] 97.4 F (36.3 C) (09/08 0751) Pulse Rate:  [89-119] 113 (09/08 0815) Resp:  [6-31] 16 (09/08 0845) BP: (72-127)/(47-89) 108/67 (09/08 0845) SpO2:  [91 %-100 %] 100 % (09/08 0815)  Assessment/Plan: 63 year old male with Hx of grade 5 prostate cancer-s/p radiation and ADT in 2022, subsequently lost to follow-up.  EF below 20% with gangrene of left leg, s/p BKA.  # radiation cystitis # Hemorrhagic cystitis? # Gross hematuria # Clot obstruction of urine #AKI  Catheter exchanged 9//25 for clot obstruction.  Flowing well this morning.  Clear irrigant with CBI on low gtt.  Reviewed plan, expectations, and troubleshooting with nursing.  Not a candidate for clot evacuation at this time.  Bleeding perpetuated by anticoagulation necessary for LV thrombus.  CBI on very slow gtt. Goals of care conversation with family coming in town tomorrow. For now we will Continue bladder irrigation, hand irrigating as needed, serial H&H, and as needed transfusion.  Guarded prognosis.  Renal function continues to worsen.  Not a candidate for dialysis.  Likely multifactorial between prerenal damage caused by his ejection fraction below 15 and new diagnosis of multiple myeloma.  Continuing IV ABX and antifungals.    Continue serial H&H.  Transfuse for Hgb < 8.0.  Hand irrigate as needed  Urology will follow  Intake/Output from previous day: 09/07 0701 - 09/08 0700 In: 3877.4 [I.V.:672.4; IV Piggyback:205] Out: 9650 [Urine:9650]  Intake/Output this shift: No intake/output data recorded.  Physical Exam:  General: Alert and oriented CV: No cyanosis Lungs: equal chest rise Abdomen: Soft, NTND, no rebound or guarding Gu: Three-way hematuria catheter in place with CBI on low gtt.  Lab Results: Recent Labs     01/07/24 1422 01/08/24 0045 01/08/24 0851  HGB 7.3* 6.4* 8.1*  HCT 21.9* 18.7* 24.0*   BMET Recent Labs    01/07/24 1422 01/08/24 0045 01/08/24 0851  NA 120* 121*  --   K 5.4* 5.4*  --   CL 86* 85*  --   CO2 18* 14*  --   GLUCOSE 166* 91  --   BUN 150* 158*  --   CREATININE 8.47* 8.92*  --   CALCIUM  8.2* 8.2*  --   HGB 7.3* 6.4* 8.1*  WBC  --  31.6* 33.0*     Studies/Results: No results found.     LOS: 66 days   Ole Bourdon, NP [facility] Pager: [phone]  01/08/2024, 10:35 AM

## 2024-01-09 DIAGNOSIS — I5020 Unspecified systolic (congestive) heart failure: Secondary | ICD-10-CM | POA: Diagnosis not present

## 2024-01-09 DIAGNOSIS — Z8546 Personal history of malignant neoplasm of prostate: Secondary | ICD-10-CM

## 2024-01-09 DIAGNOSIS — R531 Weakness: Secondary | ICD-10-CM | POA: Diagnosis not present

## 2024-01-09 DIAGNOSIS — M868X7 Other osteomyelitis, ankle and foot: Secondary | ICD-10-CM | POA: Diagnosis not present

## 2024-01-09 DIAGNOSIS — Z515 Encounter for palliative care: Secondary | ICD-10-CM | POA: Diagnosis not present

## 2024-01-09 DIAGNOSIS — R319 Hematuria, unspecified: Secondary | ICD-10-CM | POA: Insufficient documentation

## 2024-01-09 DIAGNOSIS — N184 Chronic kidney disease, stage 4 (severe): Secondary | ICD-10-CM

## 2024-01-09 DIAGNOSIS — D62 Acute posthemorrhagic anemia: Secondary | ICD-10-CM | POA: Diagnosis not present

## 2024-01-09 DIAGNOSIS — C9000 Multiple myeloma not having achieved remission: Secondary | ICD-10-CM | POA: Insufficient documentation

## 2024-01-09 DIAGNOSIS — A419 Sepsis, unspecified organism: Secondary | ICD-10-CM | POA: Diagnosis not present

## 2024-01-09 DIAGNOSIS — Z66 Do not resuscitate: Secondary | ICD-10-CM | POA: Insufficient documentation

## 2024-01-09 DIAGNOSIS — N390 Urinary tract infection, site not specified: Secondary | ICD-10-CM | POA: Insufficient documentation

## 2024-01-09 DIAGNOSIS — B377 Candidal sepsis: Secondary | ICD-10-CM | POA: Diagnosis not present

## 2024-01-09 DIAGNOSIS — N19 Unspecified kidney failure: Secondary | ICD-10-CM | POA: Insufficient documentation

## 2024-01-09 DIAGNOSIS — E162 Hypoglycemia, unspecified: Secondary | ICD-10-CM | POA: Diagnosis not present

## 2024-01-09 DIAGNOSIS — B49 Unspecified mycosis: Secondary | ICD-10-CM

## 2024-01-09 LAB — BPAM RBC
Blood Product Expiration Date: 202510022359
Blood Product Expiration Date: 202510032359
ISSUE DATE / TIME: 202509052115
ISSUE DATE / TIME: 202509080145
Unit Type and Rh: 7300
Unit Type and Rh: 7300

## 2024-01-09 LAB — TYPE AND SCREEN
ABO/RH(D): B POS
Antibody Screen: NEGATIVE
Unit division: 0
Unit division: 0

## 2024-01-09 LAB — OSMOLALITY, URINE: Osmolality, Ur: 296 mosm/kg — ABNORMAL LOW (ref 300–900)

## 2024-01-09 LAB — BASIC METABOLIC PANEL WITH GFR
Anion gap: 20 — ABNORMAL HIGH (ref 5–15)
BUN: 156 mg/dL — ABNORMAL HIGH (ref 8–23)
CO2: 14 mmol/L — ABNORMAL LOW (ref 22–32)
Calcium: 7.5 mg/dL — ABNORMAL LOW (ref 8.9–10.3)
Chloride: 84 mmol/L — ABNORMAL LOW (ref 98–111)
Creatinine, Ser: 8.97 mg/dL — ABNORMAL HIGH (ref 0.61–1.24)
GFR, Estimated: 6 mL/min — ABNORMAL LOW (ref >60)
Glucose, Bld: 205 mg/dL — ABNORMAL HIGH (ref 70–99)
Potassium: 5.4 mmol/L — ABNORMAL HIGH (ref 3.5–5.1)
Sodium: 118 mmol/L — CL (ref 135–145)

## 2024-01-09 LAB — RENAL FUNCTION PANEL
Albumin: 1.5 g/dL — ABNORMAL LOW (ref 3.5–5.0)
Anion gap: 20 — ABNORMAL HIGH (ref 5–15)
BUN: 155 mg/dL — ABNORMAL HIGH (ref 8–23)
CO2: 16 mmol/L — ABNORMAL LOW (ref 22–32)
Calcium: 7.4 mg/dL — ABNORMAL LOW (ref 8.9–10.3)
Chloride: 85 mmol/L — ABNORMAL LOW (ref 98–111)
Creatinine, Ser: 8.86 mg/dL — ABNORMAL HIGH (ref 0.61–1.24)
GFR, Estimated: 6 mL/min — ABNORMAL LOW (ref >60)
Glucose, Bld: 173 mg/dL — ABNORMAL HIGH (ref 70–99)
Phosphorus: 7.7 mg/dL — ABNORMAL HIGH (ref 2.5–4.6)
Potassium: 5.2 mmol/L — ABNORMAL HIGH (ref 3.5–5.1)
Sodium: 121 mmol/L — ABNORMAL LOW (ref 135–145)

## 2024-01-09 LAB — CBC
HCT: 20.7 % — ABNORMAL LOW (ref 39.0–52.0)
Hemoglobin: 7 g/dL — ABNORMAL LOW (ref 13.0–17.0)
MCH: 29.2 pg (ref 26.0–34.0)
MCHC: 33.8 g/dL (ref 30.0–36.0)
MCV: 86.3 fL (ref 80.0–100.0)
Platelets: 146 K/uL — ABNORMAL LOW (ref 150–400)
RBC: 2.4 MIL/uL — ABNORMAL LOW (ref 4.22–5.81)
RDW: 17.8 % — ABNORMAL HIGH (ref 11.5–15.5)
WBC: 20.8 K/uL — ABNORMAL HIGH (ref 4.0–10.5)
nRBC: 0 % (ref 0.0–0.2)

## 2024-01-09 LAB — CALCIUM, IONIZED: Calcium, Ionized, Serum: 3.7 mg/dL — ABNORMAL LOW (ref 4.5–5.6)

## 2024-01-09 LAB — HEPATIC FUNCTION PANEL
ALT: 209 U/L — ABNORMAL HIGH (ref 0–44)
AST: 328 U/L — ABNORMAL HIGH (ref 15–41)
Albumin: <1.5 g/dL — ABNORMAL LOW (ref 3.5–5.0)
Alkaline Phosphatase: 75 U/L (ref 38–126)
Bilirubin, Direct: 0.2 mg/dL (ref 0.0–0.2)
Indirect Bilirubin: 0.6 mg/dL (ref 0.3–0.9)
Total Bilirubin: 0.8 mg/dL (ref 0.0–1.2)
Total Protein: 5.7 g/dL — ABNORMAL LOW (ref 6.5–8.1)

## 2024-01-09 LAB — HEPARIN LEVEL (UNFRACTIONATED): Heparin Unfractionated: 0.28 [IU]/mL — ABNORMAL LOW (ref 0.30–0.70)

## 2024-01-09 LAB — GLUCOSE, CAPILLARY: Glucose-Capillary: 166 mg/dL — ABNORMAL HIGH (ref 70–99)

## 2024-01-09 LAB — SODIUM, URINE, RANDOM: Sodium, Ur: 128 mmol/L

## 2024-01-09 MED ORDER — MIDAZOLAM HCL 2 MG/2ML IJ SOLN
INTRAMUSCULAR | Status: AC
  Filled 2024-01-09: qty 2

## 2024-01-09 MED ORDER — METOCLOPRAMIDE HCL 5 MG/ML IJ SOLN: 5.0000 mg | Freq: Two times a day (BID) | INTRAMUSCULAR | Status: AC | PRN

## 2024-01-09 MED ORDER — FUROSEMIDE 10 MG/ML IJ SOLN
200.0000 mg | Freq: Once | INTRAVENOUS | Status: AC
  Administered 2024-01-09: 200 mg via INTRAVENOUS
  Filled 2024-01-09: qty 20

## 2024-01-09 MED ORDER — SODIUM CHLORIDE 0.9 % IV SOLN: INTRAVENOUS | Status: DC

## 2024-01-09 NOTE — Progress Notes (Signed)
 Nutrition Brief Note  Chart reviewed. Pt now transitioning to comfort care, plan to discharge  to hospice home.  No further nutrition interventions planned at this time.  Please re-consult as needed.   Betsey Finger MS, RDN, LDN, CNSC Registered Dietitian 3 Clinical Nutrition RD Inpatient Contact Info in Amion

## 2024-01-09 NOTE — Progress Notes (Signed)
 [facility] NEPHROLOGY PROGRESS NOTE  Assessment/ Plan: Pt is a 63 y.o. yo male   # AKI on CKD -suspect ATN at this point. Causes are likely related to recent shock/decrease renal perfusion, MGRS, obstruction. Received contrast on 8/29. repeat renal ultrasound still with hydronephrosis-urology following -BUN and Cr continues to rise, now with asterixis/early uremic symptoms.  Also with multiple lab/electrolytes abnormalities and acidosis. - We discussed about starting dialysis after catheter placement however patient stated he would like to wait until his family member comes to the hospital before doing dialysis.  Noted palliative care is planning to meet with his mother today. Overall, not an ideal long term candidate for HD given his current clinical situation/comorbidities (HFrEF with hypotension, poor functional status-now with BKA, fungemia, untreated MM, bleeding). I do not foresee him being a candidate for PD in the long run at this time given unstable housing situation.  -anticipating HD catheter placement and starting dialysis in the next few days if patient and family desire to proceed with HD.  Stop IV fluid and order a dose of diuretics.  Discussed with ICU team. -Continue strict Input and Output monitoring. Will monitor the patient closely with you and intervene or adjust therapy as indicated by changes in clinical status/labs    # Septic shock Fungemia Multifocal pneumonia -abx/micofungin per primary service -off pressors now. Keep MAP >65 to ensure adequate renal perfusion -ID following   #Urinary retention Hematuria -urology following, undergoing CBI    #LV thrombus -on heparin  drip   #Multiple Myeloma -diagnosed via bone marrow biopsy. Plan to follow up with oncology OP. Given his acute/current issues along with functional status, I do not foresee him undergoing chemo at this time given his current comorbidities. 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  Check urine sodium, urine osmolality.  Stop IV fluid which was started by CCM overnight.  Order Lasix .  Recommend fluid restriction for now.  He will need lowest sodium possible and a slow dialysis if will start renal replacement treatment.   #Acidosis -secondary to AKI, sodium bicarbonate  today, stable   #Chronic osteomyelitis s/p right BKA -per primary service   #Anemia -transfuse prn for Hgb <7 -iron  not indicated given sepsis, holding off on ESA for now.  # Hyperkalemia: Continue  Lokelma  twice daily.  Discussed with ICU team.  Subjective: Seen and examined at bedside.  Plan for family meeting.  He reports not feeling well.  Some nausea but no vomiting.  No chest pain or shortness of breath.  Objective Vital signs in last 24 hours: Vitals:   01/09/24 0530 01/09/24 0545 01/09/24 0600 01/09/24 0615  BP: (!) 91/58  (!) 85/49 103/61  Pulse: 94 96 92 96  Resp: (!) 24 (!) 25 (!) 22 14  Temp:      TempSrc:      SpO2: 98% 98% 98% 100%  Weight:    63.2 kg  Height:       Weight change:   Intake/Output Summary (Last 24 hours) at 01/09/2024 1026 Last data filed at 01/09/2024 0650 Gross per 24 hour  Intake 7618.85 ml  Output 5850 ml  Net 1768.85 ml       Labs: RENAL PANEL Recent Labs  Lab 01/02/24 1849 01/03/24 0500 01/04/24 0224 01/05/24 0248 01/06/24 0504 01/07/24 1422 01/08/24 0045 01/08/24 1741 01/09/24 0108 01/09/24 0836  NA  --    < > 129* 125*   < >  120* 121* 121* 118* 121*  K  --    < > 4.5 4.3   < > 5.4* 5.4* 6.1* 5.4* 5.2*  CL  --    < > 93* 90*   < > 86* 85* 86* 84* 85*  CO2  --    < > 18* 20*   < > 18* 14* 12* 14* 16*  GLUCOSE  --    < > 197* 317*   < > 166* 91 223* 205* 173*  BUN  --    < > 114* 130*   < > 150* 158* 154* 156* 155*  CREATININE  --    < > 6.03* 6.66*   < > 8.47* 8.92* 9.05* 8.97* 8.86*  CALCIUM   --    < > 8.1*  8.2*   < > 8.2* 8.2* 7.7* 7.5* 7.4*  MG 2.2  --  2.3 2.3  --   --   --   --   --   --   PHOS  --   --   --  6.1*  --   --   --  8.4*  --  7.7*  ALBUMIN  --   --   --   --    < > 1.7* 1.6* 1.7* <1.5* 1.5*   < > = values in this interval not displayed.    Liver Function Tests: Recent Labs  Lab 01/07/24 1422 01/08/24 0045 01/08/24 1741 01/09/24 0108 01/09/24 0836  AST 43* 102*  --  328*  --   ALT 81* 95*  --  209*  --   ALKPHOS 57 63  --  75  --   BILITOT 0.7 0.9  --  0.8  --   PROT 6.3* 6.1*  --  5.7*  --   ALBUMIN 1.7* 1.6* 1.7* <1.5* 1.5*   Recent Labs  Lab 01/05/24 1841  LIPASE 25   No results for input(s): AMMONIA in the last 168 hours. CBC: Recent Labs    06/14/23 0452 06/15/23 0414 06/16/23 0404 10/13/23 9385 10/14/23 9365 11/03/23 0423 11/04/23 0406 01/07/24 0335 01/07/24 1422 01/08/24 0045 01/08/24 0851 01/09/24 0108  HGB  --  8.1*   < > 7.3*   < > 8.1*   < > 7.2* 7.3* 6.4* 8.1* 7.0*  MCV  --  85.4   < > 89.8   < > 85.0   < > 85.7  --  87.4 87.0 86.3  VITAMINB12 908 871  --  459  --   --   --   --   --   --   --   --   FOLATE  --  7.2  --  8.5  --   --   --   --   --   --   --   --   FERRITIN 264 222  --   --   --  1,007*  --   --   --   --   --   --   TIBC 161* 150*  --  165*  --   --   --   --   --   --   --   --   IRON  22* 21*  --  18*  --   --   --   --   --   --   --   --   RETICCTPCT  --   --   --  1.3  --   --   --   --   --   --   --   --    < > =  values in this interval not displayed.    Cardiac Enzymes: No results for input(s): CKTOTAL, CKMB, CKMBINDEX, TROPONINI in the last 168 hours. CBG: Recent Labs  Lab 01/08/24 1143 01/08/24 1527 01/08/24 1926 01/08/24 2257 01/09/24 0752  GLUCAP 148* 180* 233* 229* 166*    Iron  Studies: No results for input(s): IRON , TIBC, TRANSFERRIN, FERRITIN in the last 72 hours. Studies/Results: No results found.  Medications: Infusions:  cefTRIAXone  (ROCEPHIN )  IV Stopped (01/08/24  1641)   furosemide      heparin  1,250 Units/hr (01/09/24 0609)   micafungin  (MYCAMINE ) 100 mg in sodium chloride  0.9 % 100 mL IVPB 100 mg (01/09/24 0956)   norepinephrine  (LEVOPHED ) Adult infusion 3 mcg/min (01/09/24 0600)   sodium chloride  irrigation      Scheduled Medications:  sodium chloride    Intravenous Once   Chlorhexidine  Gluconate Cloth  6 each Topical Daily   dexamethasone  (DECADRON ) injection  20 mg Intravenous Weekly   feeding supplement  237 mL Oral TID BM   Gerhardt's butt cream   Topical BID   multivitamin  1 tablet Oral QHS   mupirocin  ointment   Nasal BID   sodium zirconium cyclosilicate   10 g Oral BID   thiamine   100 mg Oral Daily    have reviewed scheduled and prn medications.  Physical Exam: General:NAD, comfortable Heart:RRR, s1s2 nl Lungs:clear b/l, no crackle Abdomen:soft, Non-tender, non-distended Extremities:No edema, right BKA Neurology: Alert awake and following commands  Abhiraj Dozal Prasad Julianah Marciel 01/09/2024,10:26 AM  LOS: 67 days

## 2024-01-09 NOTE — TOC Progression Note (Signed)
 Transition of Care [facility]) - Progression Note    Patient Details  Name: Derek Thompson MRN: [id number] Date of Birth: [birth year]  [facility] of [facility]) CM/SW Contact  Justina Delcia Czar, RN Phone Number: [phone] 01/09/2024, 2:45 PM  Clinical Narrative:    Received call back from [facility] Pt Transfer Coordinator, Leita Maes states pt is not service connected or have a PCP at [place]. States all [facility] patients are eligible for [employer]. Updated pt has been referred to [facility] at [facility]. Waiting confirmation from [facility].      Expected Discharge Plan: Skilled Nursing Facility Barriers to Discharge: Insurance Authorization    Expected Discharge Plan and Services In-house Referral: Clinical Social Work   Post Acute Care Choice: Skilled Nursing Facility Living arrangements for the past 2 months: Homeless Shelter     Social Drivers of Health (SDOH) Interventions SDOH Screenings   Food Insecurity: No Food Insecurity (11/02/2023)  Housing: High Risk (11/02/2023)  Transportation Needs: Unmet Transportation Needs (11/02/2023)  Utilities: Not At Risk (11/02/2023)  Alcohol Screen: Low Risk  (08/19/2022)  Depression (PHQ2-9): Low Risk  (10/11/2023)  Financial Resource Strain: Low Risk  (08/19/2022)  Tobacco Use: Medium Risk (11/17/2023)    Readmission Risk Interventions     No data to display

## 2024-01-09 NOTE — Progress Notes (Addendum)
 NAME:  Kinsler Soeder, MRN:  [id number], DOB:  1960/08/17, LOS: 67 ADMISSION DATE:  11/02/2023, CONSULTATION DATE:  829 REFERRING MD:  Shawn, CHIEF COMPLAINT:  shock   History of Present Illness:   Mr. 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 His course has been significant for suspected cathter associated UTI s/p IV antibiotics from 7/22-7/27. He also developed AKI on CKD and further work-up revealed multiple myeloma on bone marrow biopsy. In early August he was cleared for hospital discharge but was unable to find an accepting facility and did not have a safe discharge dispo due to homelessness. [facility] was [facility] in the [facility]. On 8/29 he was transferred to the ICU for somnolence, hypotension and new fever with concern for mixed septic and cardiogenic shock. He was briefly vasopressors, started on dobutamine  and resumed on IV Zosyn  with presumed urinary source of infection. He was transferred to the floor on 9/1 and IMTS resumed care 9/2. He subsequently developed new fever, tachycardia and hypotension prompting PCCM consult and return to ICU.   Pertinent  Medical History  Heart failure with reduced EF, baseline in the 20 range, urinary retention secondary to history of prostate cancer with chronic Foley, chronic right lower extremity osteomyelitis, CKD stage IIIb.  Significant Hospital Events: Including procedures, antibiotic start and stop dates in addition to other pertinent events   7/4 Admitted 7/17 Bone marrow biopsy 7/18 s/p R BKA 8/27-8/28 rising BUN and Cr 8/28 new fever, hematuria, AMS and shock requiring NE, echocardiogram showing EF<20% and moderate RV dysfunction, started on  dobutamine   9/3 off pressors and dobutamine , undergoing CBI 9/4 runs of NSVT overnight, stable without pressors, BC + for candida, ID consult and micafungin   9/5 BUN/creatinine continue to trend up, remained tachycardic 9/6 22 SVT overnight, required 5 mg of IV metoprolol  and converted to sinus tach.  BUN/creatinine  9/7 BUN/cr trending up, felt degree of ATN by nephro, starting to get uremic symptoms (asterixis). Off pressors Remained on CBI; for on-going hematuria, still had IV heparin  going for LV thrombus. Back on pressors PM hours for SBP drop to 70s,  9/8 Hgb down to 6.4 (xfuse 1 unit PRBC).  At point where he he would require dialysis, patient wants to discuss this with his family in light of his multiple medical issues. 9/9 Na drifting down. NaCl started by night team. This was dc'd on am rounds. About same exam-wise. Awaiting family for GOC discussion   Interim History / Subjective:  Fatigued. Not in distress  Objective    Blood pressure 103/61, pulse 96, temperature (!) 96.2 F (35.7 C), temperature source Axillary, resp. rate 14, height 6' (1.829 m), weight 63.2 kg, SpO2 100%.        Intake/Output Summary (Last 24 hours) at 01/09/2024 0919 Last data filed at 01/09/2024 0650 Gross per 24 hour  Intake 7618.85 ml  Output 5850 ml  Net 1768.85 ml   Filed Weights   01/03/24 0600 01/05/24 0318 01/09/24 0615  Weight: 58.1 kg 59.8 kg 63.2 kg    Physical exam: General This is a chronically ill appearing 63 year old male patient, currently lying in bed he is fatigued a little more withdrawn today HEENT temporal wasting mucous membranes  moist he does exhibit JVD Pulmonary diminished bases no current accessory use Cardiac regular rate and rhythm without murmur rub or gallop Abdomen soft nontender no organomegaly, his diet/appetite is decreasing GU he has continuous bladder irrigation still going.  Output from this is pink Neuro he is awake and oriented x 3, appropriate but a little  more withdrawn today.  Still exhibiting asterixis with upper extremities.  Generalized weakness Extremities are warm and well-perfused, his BKA does not appear infected  Resolved problem list  Concern for uremia as he is complaining of persistent nausea and vomiting Septic shock Shock resolved, off pressors Multifocal pneumonia Assessment and Plan   Sepsis secondary to acute urinary tract infection and Candida glabrata fungemia.  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  If the patient and the family want to continue with aggressive intervention we will obtain a repeat TTE If negative stop heparin   Paroxysmal SVT Plan Tele  Worsening AKI on CKD stage IIIb w/Metabolic acidosis and progressive uremia & hyperkalemia   Suspect mixed picture etiology initially mix of shock and post-obstruction now ATN Poor candidate for long term hd, with concern as to whether or not he could even tolerate intermittent dialysis with his cardiomyopathy, and hypotension.  Additionally his poor functional status, untreated multiple myeloma, ongoing  hematuria I will make him a poor candidate as well.  Would not be able to even consider tunneled catheterization until fungemia treatment completed.  Doubt ever PD candidate Plan Planning to meet with the mother and brother today with the patient Lasix  x 1 Renal dose meds If pt decides he wants to proceed will need CRRT Repeat Lokelma  Q 5 and 5 renal profiles  Acute on chronic hyponatremia in the setting of his Multiple myeloma (so to some degree pseudohyponatremia) c/b CBI, CHF and renal failure  Has been stable in 120-124 range, dropped to 118, more inclined to think this is from his renal failure Plan Lasix  x 1 Discontinue his sodium chloride  infusion Trend chems If decides to start CRRT sodium should improve, but again I really think he is a poor candidate for this Historically looks like baseline ~ 130 (? Accounting for the MM??) Urine studies are ordered, I am not sure how accurate these will be with CBI  Multiple myeloma (formal new dx per onc), started on weekly steroid Plan Last seen by onc 9/4 Steroids started 9/3 No plan for chemo at this time given fungemia and multiple organ failure   Acute blood loss anemia on anemia of chronic disease He received a unit of blood on 9/8.  Hemoglobin went from 6 4-8.1, now settled  at 7  plan We will repeat a CBC this afternoon if ongoing aggressive care desired Would transfuse for hemoglobin less than 7 if we are going to continue aggressive care Will hold off on transfusion currently as I suspect we are also seeing some degree of hemodilution  Shock liver, also have to consider impact of antifungals, as well as progressive venous congestion Plan Continue to trend Stop scheduled apap as well as statin   Diabetes type 2 with steroid-induced hyperglycemia Blood glucose starting to trend back up  plan Will cont cbgs over today  If stays elevated add very sensitive SSI  Chronic osteomyelitis of right lower extremity status post  BKA Plan Routine wound care  Severe protein calorie malnutrition Plan Nutritional support  RD following  My critical care time is 32 minutes     Attending attestation:  Mr. Dowis is a 63 y/o gentleman with a history of HFrEF, DM who presented in 10/2023 with diabetic LE infection, complicated by fungemia due to CVC. He developed AKI and was diagnosed with MM this admission. His family is coming in today for a family meeting. He understands he is in a chronically severely ill situation. Today he denies pain.  BP (!) 89/54   Pulse 100   Temp (!) 96.2 F (35.7 C) (Axillary) Comment: RN Notified. Warm blankets applied and room temp raised  Resp (!) 27   Ht 6' (1.829 m)   Wt 63.2 kg   SpO2 95%   BMI 18.90 kg/m  Chronically ill appearing man lying in bed in NAD [place]/AT, eyes anicteric Breathing comfortably on [place], mild tachypnea, faint rhales.  S1S2, RRR Abd soft, NT RLE BKA, minimal edema Awake, alert, answering questions appropriately. Globally weak but able to reposition in bed. Normal speech.  Seems to have insight into his severe medical situation.   Na+ 121 K+ 5.2 Bciarb 12 BUN 155 Cr 7.4 WBC 20.8 H/H 7/20.7 Platelets 146  Assessment & plan: Septic shock due to fungemia- Candida glabrata -con't micafungin  -wean pressors as able to maintain MAP >65  AKI, worseing azotemia Hyperkalemia Pseudohyponatremia, hypervolemia related hyponatremia -if he wants to continue aggressive care measures, would need CRRT vs iHD -long term has poor prognosis for renal recovery with baseline CKD, cardiorenal syndrome, myeloma kidney -high dose lasix  today -stopping IVF again -lokelma   MM Anemia Thrombocytopenia -weekly decadron  -transfuse for Hb <7 or hemodynamically significant bleeding -appreciate oncology's evaluation  Gross hematuria requiring CBI; precipitated by UTI, possibly related to previous prostate radiation -appreciate urology's management -CBI per  urology -con't heparin   Acute on chronic HFrEF LV thrombus -con't heparin  -GDMT limited by shock & renal failure -lasix  for volume management; would need RRT if wanting to continue aggressive care  Deconditioning -PT, OT -ancipitate he may need SNF after discharge for HD purposes   Recent BKA RLE due to diabetic infection -PT, OT -long-term would need better glucose control  DM; previously hypoglycemic> now hyperglycemic -hold on restarting insulin  for now given minimal PO intake -will need more insulin  with steroids for 1-2 days  Severe protein energy malnutrition -appreciate RD's management -encourage PO intake -ensure  I discussed with Mr. Pinn that we will meet with his family and discuss his complex situation. I briefly alluded to the fact that many of his issues lack good solutions.  He related that he is tired of being in bed and is tired of being in the hospital. Discussed plan with palliative medicine this morning-- meeting at 10.  This patient is  critically ill with multiple organ system failure which requires frequent high complexity decision making, assessment, support, evaluation, and titration of therapies. This was completed through the application of advanced monitoring technologies and extensive interpretation of multiple databases. During this encounter critical care time was devoted to patient care services described in this note for 41 minutes.  Leita SHAUNNA Gaskins, DO 01/09/24 11:18 AM [facility]  For contact information, see Amion. If no response to pager, please call PCCM consult pager. After hours, 7PM- 7AM, please call Elink.

## 2024-01-09 NOTE — Progress Notes (Addendum)
 PHARMACY - ANTICOAGULATION CONSULT NOTE  Pharmacy Consult:  Heparin  Indication: LV thrombus  Allergies  Allergen Reactions   Cefepime  Other (See Comments)    Cefepime  neurotoxicity 11/07/23    Patient Measurements: Height: 6' (182.9 cm) Weight: 59.8 kg (131 lb 13.4 oz) IBW/kg (Calculated) : 77.6 HEPARIN  DW (KG): 42.1  Vital Signs: Temp: 96.2 F (35.7 C) (09/09 0020) Temp Source: Axillary (09/09 0020) BP: 95/61 (09/09 0400) Pulse Rate: 93 (09/09 0400)  Labs: Recent Labs    01/07/24 2002 01/08/24 0045 01/08/24 0851 01/08/24 1741 01/09/24 0108  HGB  --  6.4* 8.1*  --  7.0*  HCT  --  18.7* 24.0*  --  20.7*  PLT  --  177 172  --  146*  HEPARINUNFRC 0.25*  --  0.27*  --  0.28*  CREATININE  --  8.92*  --  9.05* 8.97*    Estimated Creatinine Clearance: 7 mL/min (A) (by C-G formula based on SCr of 8.97 mg/dL (H)).   Assessment: 78 YOM with LV thrombus off Eliquis  due to hematuria, last dose 8/29 AM.  CBC stable and Pharmacy consulted to dose IV heparin .  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  01/08/24 AM: Heparin  level 0.27, remains subtherapeutic despite increasing to 1250 units/hr overnight. No issues with infusion running noted. Gross hematuria still present in urine. Hgb decreased to 6.4 g/dL requiring blood transfusion this morning. PLTs remain stable at 172. Of note, had been on 1150 units/hr in past with heparin  level on higher end of goal. Discussed with CCM, will not increase dose further at this time. Will target slightly lower goal of 0.2-0.5 units/mL.   9/9 am Heparin  level 0.28 is therapeutic on 1250 units/hr. Hgb dropped. Per RN, hematuria has increased a bit overnight and now is a bit darker with more clots.  Goal of Therapy:  Heparin  level 0.2-0.5 units/ml d/t bleeding risk Monitor platelets by anticoagulation protocol: Yes  Plan:  Continue heparin  at 1250  units/hr, target lower end of range Monitor daily heparin  level, CBC, signs/symptoms of bleeding  Monitor for hematuria resolution  Thank you for allowing pharmacy to participate in this patient's care,  Jinnie Door, PharmD, BCPS, [facility] Clinical Pharmacist  Please check AMION for all [facility] Pharmacy phone numbers After 10:00 PM, call Main Pharmacy [phone]

## 2024-01-09 NOTE — TOC Progression Note (Signed)
 Transition of Care [facility]) - Progression Note    Patient Details  Name: Derek Thompson MRN: [id number] Date of Birth: 01/13/1961  [facility] CM/SW Contact  Isaiah Public, LCSWA Phone Number: 01/09/2024, 11:54 AM  Clinical Narrative:     CSW received consult form mary NP with palliative for residential hospice placement for patient.[name] informed CSW that family interested in [facility] for patient. CSW spoke with [name] patients mother who gave CSW permission to make referral to [facility] for patient. CSW made referral to [facility] With [facility]. CSW will continue to follow.  Expected Discharge Plan: Skilled Nursing Facility Barriers to Discharge: Insurance Authorization               Expected Discharge Plan and Services In-house Referral: Clinical Social Work   Post Acute Care Choice: Skilled Nursing Facility Living arrangements for the past 2 months: Homeless Shelter                                       Social Drivers of Health (SDOH) Interventions SDOH Screenings   Food Insecurity: No Food Insecurity (11/02/2023)  Housing: High Risk (11/02/2023)  Transportation Needs: Unmet Transportation Needs (11/02/2023)  Utilities: Not At Risk (11/02/2023)  Alcohol Screen: Low Risk  (08/19/2022)  Depression (PHQ2-9): Low Risk  (10/11/2023)  Financial Resource Strain: Low Risk  (08/19/2022)  Tobacco Use: Medium Risk (11/17/2023)    Readmission Risk Interventions     No data to display

## 2024-01-09 NOTE — Progress Notes (Signed)
 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 Nephrology following Discussed with BSN     Intervention Category Intermediate Interventions: Electrolyte abnormality - evaluation and management  Damien ONEIDA Grout 01/09/2024, 3:45 AM

## 2024-01-09 NOTE — Discharge Summary (Signed)
 Physician Discharge Summary         Patient ID: Derek Thompson MRN: [id number] DOB/AGE: 64-Feb-1962 33 y.o.  Admit date: 11/02/2023 Discharge date: 01/09/2024  Discharge Diagnoses:    Active Hospital Problems   Diagnosis Date Noted   Hematuria 01/09/2024    Priority: 1.   Uremia 01/09/2024    Priority: 1.   Urinary retention 11/02/2023    Priority: 1.   Hyponatremia 10/12/2023    Priority: 1.   AKI (acute kidney injury) (HCC) 06/14/2023    Priority: 1.   Hyperkalemia 11/22/2022    Priority: 1.   HFrEF (heart failure with reduced ejection fraction) (HCC) 11/02/2023    Priority: 2.   Acute thrombus of left ventricle (HCC) 06/19/2023    Priority: 2.   Chronic systolic heart failure (HCC)     Priority: 2.   Urinary tract infection 01/09/2024    Priority: 3.   Fungemia 01/05/2024    Priority: 3.   Multiple myeloma (HCC) 01/09/2024    Priority: 4.   Monoclonal gammopathy 11/12/2023    Priority: 4.   DNR (do not resuscitate) 01/09/2024    Priority: 5.   History of prostate cancer 01/09/2024   Protein-calorie malnutrition, severe 01/04/2024   Moderate protein-calorie malnutrition (HCC) 12/02/2023   Hx of right BKA (HCC) 11/27/2023   Hypoalbuminemia 11/08/2023   Chronic anemia 11/04/2023   Generalized weakness 11/03/2023   Frailty 11/02/2023   Anemia 06/14/2023   CAD (coronary artery disease) 06/10/2020   DM2 (diabetes mellitus, type 2) (HCC) 06/10/2020    Resolved Hospital Problems   Diagnosis Date Noted Date Resolved   Osteomyelitis of right foot (HCC) 06/15/2023 01/09/2024   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 (HCC) 11/04/2023 01/09/2024   Sinus tachycardia 11/03/2023 01/09/2024   Chronic osteomyelitis involving lower leg, right (HCC) 11/02/2023 01/09/2024   Gangrene of right foot [facility]) 09/02/2023 01/09/2024   Severe sepsis with septic shock [facility]) 06/14/2023 01/09/2024      Discharge summary    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 His course has been significant for suspected cathter associated UTI s/p IV antibiotics from 7/22-7/27. He also developed AKI on CKD and further work-up revealed multiple myeloma on bone marrow biopsy. In early August he was cleared for hospital discharge but was unable to find an accepting facility and did not have a safe discharge dispo due to homelessness. He was [facility] in the [facility]. On 8/29 he was transferred to the ICU for somnolence, hypotension and new fever with concern for mixed septic and cardiogenic shock. He was briefly vasopressors, started on dobutamine  and resumed on IV Zosyn  with presumed urinary source of infection. He was transferred to the floor on 9/1 and IMTS resumed care 9/2. He subsequently developed new fever, tachycardia and hypotension prompting PCCM consult and return to ICU.   Hospital course  7/4 Admitted 7/17 Bone marrow biopsy 7/18 s/p R BKA 8/27-8/28 rising BUN and Cr 8/28 new fever, hematuria, AMS and shock requiring NE, echocardiogram showing EF<20% and moderate RV dysfunction, started on dobutamine   9/3 off pressors and dobutamine , undergoing CBI 9/4 runs of NSVT overnight, stable without pressors, BC + for candida, ID consult  and micafungin   9/5 BUN/creatinine continue to trend up, remained tachycardic 9/6 22 SVT overnight, required 5 mg of IV metoprolol  and converted to sinus tach.  BUN/creatinine  9/7 BUN/cr trending up, felt degree of ATN by nephro, starting to get uremic symptoms (asterixis). Off pressors Remained on CBI; for on-going hematuria, still had IV heparin  going for LV thrombus. Back on pressors PM hours for SBP drop to 70s,  9/8 Hgb down to  6.4 (xfuse 1 unit PRBC).  At point where he he would require dialysis, patient wants to discuss this with his family in light of his multiple medical issues. 9/9 Na drifting down. NaCl started by night team. This was dc'd on am rounds. About same exam-wise. Awaiting family for GOC discussion  9/10 family conference w/ palliative. Discussed his current decline. Need for dialysis but poor health and limited options. Ultimately patient decided he wanted to transition to comfort with his family in agreement. Antibiotic therapy, antifungal therapy, labs and CBI were discontinued.   Discharge Plan by Active Problems    Sepsis secondary to acute urinary tract infection and Candida glabrata fungemia.  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  He is now full DNR/comfort care Diet as tolerated. Continuous bladder irrigation stopped.. Will be discharged with the CBI foley capped. Can irrigate as needed 50-144ml NS via the irritation port as needed.   Discharge to [facility]    Discharge Exam: BP 91/65   Pulse 98   Temp (!) 96.2 F (35.7 C) (Axillary) Comment: RN Notified. Warm blankets applied and room temp raised  Resp 14   Ht 6' (1.829 m)   Wt 63.2 kg   SpO2 97%   BMI  18.90 kg/m  General This is a chronically ill appearing 63 year old male patient, currently lying in bed he is fatigued a little more withdrawn today HEENT temporal wasting mucous membranes moist he does exhibit JVD Pulmonary diminished bases no current accessory use Cardiac regular rate and rhythm without murmur rub or gallop Abdomen soft nontender no organomegaly, his diet/appetite is decreasing GU he has continuous bladder irrigation still going.  Output from this is pink Neuro he is awake and oriented x 3, appropriate but a little more withdrawn today.  Still exhibiting asterixis with upper extremities.  Generalized weakness Extremities are warm and well-perfused, his BKA does not appear infected  Labs at discharge   Lab Results  Component Value Date   CREATININE 8.86 (H) 01/09/2024   BUN 155 (H) 01/09/2024   NA 121 (L) 01/09/2024   K 5.2 (H) 01/09/2024   CL 85 (L) 01/09/2024   CO2 16 (L) 01/09/2024   Lab Results  Component Value Date   WBC 20.8 (H) 01/09/2024   HGB 7.0 (L) 01/09/2024   HCT 20.7 (L) 01/09/2024   MCV 86.3 01/09/2024   PLT 146 (L) 01/09/2024   Lab Results  Component Value Date   ALT 209 (H) 01/09/2024   AST 328 (H) 01/09/2024   ALKPHOS 75 01/09/2024   BILITOT 0.8 01/09/2024   Lab Results  Component Value Date   INR 1.7 (H) 06/14/2023   INR 1.0 02/04/2021   INR 1.0 01/12/2021    Current radiological studies    No results found.  Disposition:     Discharge disposition: 51-Hospice/Medical Facility       Discharge Instructions     Diet - low sodium heart healthy   Complete by: As directed    Discharge instructions   Complete by: As directed    May flush foley catheter via secondary capped port as needed with 50-100 ML saline for bladder spasms or urinary retention   Increase activity slowly   Complete by: As directed    No wound care   Complete by: As directed        Allergies as of 01/09/2024       Reactions   Cefepime  Other  (See Comments)   Cefepime  neurotoxicity 11/07/23        Medication List     STOP taking these medications    aspirin  EC 81 MG tablet   atorvastatin  80 MG tablet Commonly known as: LIPITOR    Eliquis  5 MG Tabs tablet Generic drug: apixaban    ezetimibe  10 MG tablet Commonly known as: ZETIA    Farxiga  10 MG Tabs tablet Generic drug: dapagliflozin  propanediol   FeroSul 325 (65 Fe) MG tablet Generic drug: ferrous sulfate    furosemide  20 MG tablet Commonly known as: Lasix    metFORMIN  500 MG tablet Commonly known as: GLUCOPHAGE    metoprolol  succinate 25 MG 24 hr tablet Commonly known as: TOPROL -XL   multivitamin tablet   spironolactone  25 MG tablet Commonly known as: Aldactone    tamsulosin  0.4 MG Caps capsule Commonly known as: FLOMAX        TAKE these medications    metoCLOPramide  5 MG/ML injection Commonly known as: REGLAN  Inject 1 mL (5 mg total) into the vein every 12 (twelve) hours as needed.          Follow-up appointment   NA Discharge Condition:    Terminally ill   Physician Statement:   The Patient was personally examined, the discharge assessment and plan has been personally reviewed and I agree with ACNP Nas Wafer's assessment and plan. 32  minutes of time have been dedicated to discharge assessment, planning and discharge instructions.   Signed: Jeralyn FORBES Banner 01/09/2024, 3:38 PM

## 2024-01-09 NOTE — Plan of Care (Signed)
 Considering ongoing bleeding and multisystem organ failure, shared decision has been made for patient to transition to hospice care.  On rounds, CBI was clamped, hematuria was mild, and no clot accumulation was noted.  Okay to discontinue CBI at this time and plug third port with catheter plug.  Patient would likely benefit from leaving catheter in place so that at least any new clots could be hand irrigated.  Urology will sign off at this time.  Please feel free to call with questions, concerns, or acute changes.

## 2024-01-09 NOTE — Progress Notes (Signed)
 Patient ID: Derek Thompson, male   DOB: 1960-05-26, 63 y.o.   MRN: [id number]    Progress Note from the Palliative Medicine Team at [facility]   Patient Name: Derek Thompson        Date: 01/09/2024 DOB: 08-Dec-1960  Age: 63 y.o. MRN#: [id number] Attending Physician: Derek Leita SQUIBB, DO Primary Care Physician: Derek Sharper, DO Admit Date: 11/02/2023   Reason for Consultation/Follow-up   Establishing Goals of Care   HPI/ Brief Hospital Review  63 y.o. male  with past medical history of end-stage HFrEF, CKD stage 3b, prior CVA, chronic osteomyelitis of the right foot, prior history of prostate cancer s/p radiation, and bladder outlet obstruction with chronic Foley who presented to the ED on 11/02/23 with generalized weakness and was admitted with sepsis secondary to progressive right foot osteomyelitis and presumed UTI, also with AKI.   In early August, patient was medically stable for discharge but did not have a safe disposition due to homelessness.  He was enrolled in the [facility].   Palliative Medicine was re-consulted on 9/3 for goals of care discussions.    Patient faces treatment options decisions, treatment options decisions and anticipatory care needs.  Significant Hospital events: 7/4  - admitted 7/6 - initial palliative consult, goals clear for full code/full scope 7/17 -  bone marrow biopsy concerning for multiple myeloma 7/18 - status post Right BKA 8/27 - worsening renal function 8/29 - new fever, hematuria, AMS, and shock requiring pressor support, echocardiogram showing EF less than 20% and moderate RV dysfunction, started on dobutamine , transferred to ICU 9/3 - off pressors and dobutamine , undergoing CBI 9/4 runs of NSVT overnight, stable without pressors, BC + for candida, ID consult and micafungin   9/5 BUN/creatinine continue to trend up, remained tachycardic 9/6 22 SVT overnight, required 5 mg of IV metoprolol  and converted to sinus tach.  BUN/creatinine   9/7 BUN/cr trending up, felt degree of ATN by nephro, starting to get uremic symptoms (asterixis). Off pressors Remained on CBI; for on-going hematuria, still had IV heparin  going for LV thrombus. Back on pressors PM hours for SBP drop to 70s,  9/8 Hgb down to 6.4 (xfuse 1 unit PRBC).  At point where he he would require dialysis, patient wants to discuss this with his family in light of his multiple medical issues. 9/9 Na drifting down. NaCl started by night team.  Subjective  Extensive chart review has been completed prior to meeting with patient/family  including labs, vital signs, imaging, progress/consult notes, orders, medications and available advance directive documents.     I met with Derek Thompson prior to family meeting, created space and opportunity for him to explore his thoughts and feelings on his current medical situation.  He is alert and oriented Patient verbalizes an understanding of the seriousness of the likely associated poor prognosis.  He is questioning if this is end-of-life  Plan is to return to room today to meet with family for ongoing goals of care discussion.  I met at bedside with family to include mother, brother/Derek Thompson and his aunt.  CCM updated patient and family on current medical situation.  Ongoing education offered on patient's current medical situation specific to serious cardiac disease and worsening renal function.  He has recent new diagnosis of multiple myeloma.  Education offered on the difference between a full medical support path attempting to prolong life versus a comfort path foregoing life-prolonging measures and allowing for natural death.  Education offered on hospice benefit; philosophy  and eligibility.  Specific education offered on inpatient hospice eligibility and facility.  Patient is able to verbalize to his family's understanding of his medical situation and his desire to forego life-prolonging measures, refusing initiation of dialysis  and allowing a natural death.   All family present at bedside support patient's decision  Plan of care - DNR/DNI -No initiation of dialysis -Patient is hopeful for inpatient facility/[facility] for end-of-life care, will place referral and discussed with TOC -Continue current medical interventions until discharge, once discharged to inpatient hospice      - No artificial feeding or hydration now or in the future      - No further diagnostics; lab draws, scans      - No IV antibiotics or other IV medications other than those to promote comfort       - Medication for symptom management   Education offered on the natural trajectory and expectations at end-of-life, prognosis is likely days to weeks.  On further exploration patient's has strong desire to speak with his son Derek Thompson who is incarcerated in a facility in [place], [place] .  I was able to speak to Derek Thompson/patient's ex-wife [phone]) who connected me with the facility and Derek Thompson [phone]).  The chaplain will facilitate direct phone call between father and son.  Questions and concerns addressed     Discussed with primary team and nursing staff   Time: 125  minutes  Detailed review of medical records ( labs, imaging, vital signs), medically appropriate exam ( MS, skin, cardiac,  resp)   discussed with treatment team, counseling and education to patient, family, staff, documenting clinical information, medication management, coordination of care    [name] Plants NP  Palliative Medicine Team Team Phone # [phone] Pager [phone]

## 2024-01-10 DIAGNOSIS — M869 Osteomyelitis, unspecified: Secondary | ICD-10-CM

## 2024-01-10 LAB — CULTURE, BLOOD (ROUTINE X 2)
Culture: NO GROWTH
Culture: NO GROWTH

## 2024-01-11 LAB — YEAST SUSCEPTIBILITIES
Amphotericin B MIC: 1
Caspofungin MIC: 1
Fluconazole Islt MIC: 8
ISAVUCONAZOLE MIC: 0.25
Itraconazole MIC: 0.5
Posaconazole MIC: 1
Voriconazole MIC: 0.5

## 2024-01-12 LAB — CULTURE, BLOOD (ROUTINE X 2): Special Requests: ADEQUATE

## 2024-01-15 LAB — FUNGUS CULTURE, BLOOD
Culture: NO GROWTH
Special Requests: ADEQUATE

## 2024-01-26 ENCOUNTER — Encounter: Admitting: Student

## 2024-01-26 NOTE — Progress Notes (Deleted)
 CC: ***  HPI: [name] is a 63 y.o. male living with a history stated below and presents today for ***. Please see problem based assessment and plan for additional details.  Past Medical History:  Diagnosis Date   Anemia    Diabetes mellitus without complication (HCC) 1987   Family history of breast cancer    Hypertension    Myocardial infarction [facility])    Prostate cancer [facility])     Current Outpatient Medications on File Prior to Visit  Medication Sig Dispense Refill   metoCLOPramide  (REGLAN ) 5 MG/ML injection Inject 1 mL (5 mg total) into the vein every 12 (twelve) hours as needed.     No current facility-administered medications on file prior to visit.    Family History  Problem Relation Age of Onset   Breast cancer Mother        dx 30s/40s, twice   Cancer Maternal Grandfather        unknown type, d. >50    Social History   Socioeconomic History   Marital status: Divorced    Spouse name: Not on file   Number of children: 3   Years of education: Not on file   Highest education level: Associate degree: academic program  Occupational History   Occupation: Training and development officer  Tobacco Use   Smoking status: Former    Current packs/day: 0.00    Average packs/day: 1 pack/day for 43.0 years (43.0 ttl pk-yrs)    Types: Cigarettes    Start date: 05/22/1974    Quit date: 05/22/2017    Years since quitting: 6.6   Smokeless tobacco: Never   Tobacco comments:    Quit 2019 about 1PPD  Vaping Use   Vaping status: Never Used  Substance and Sexual Activity   Alcohol use: Not Currently    Comment: last drink 2015   Drug use: Not Currently    Comment: none in 7 years   Sexual activity: Not Currently  Other Topics Concern   Not on file  Social History Narrative   3 sons   Social Drivers of Corporate investment banker Strain: Low Risk  (08/19/2022)   Overall Financial Resource Strain (CARDIA)    Difficulty of Paying Living Expenses: Not very hard  Food Insecurity:  No Food Insecurity (11/02/2023)   Hunger Vital Sign    Worried About Running Out of Food in the Last Year: Never true    Ran Out of Food in the Last Year: Never true  Transportation Needs: Unmet Transportation Needs (11/02/2023)   PRAPARE - Administrator, Civil Service (Medical): Yes    Lack of Transportation (Non-Medical): Yes  Physical Activity: Not on file  Stress: Not on file  Social Connections: Not on file  Intimate Partner Violence: Not At Risk (11/02/2023)   Humiliation, Afraid, Rape, and Kick questionnaire    Fear of Current or Ex-Partner: No    Emotionally Abused: No    Physically Abused: No    Sexually Abused: No    Review of Systems: ROS negative except for what is noted on the assessment and plan.  There were no vitals filed for this visit.  Physical Exam  Physical Exam: Constitutional: well-appearing *** sitting in ***, in no acute distress HENT: normocephalic atraumatic, mucous membranes moist Eyes: conjunctiva non-erythematous Neck: supple Cardiovascular: regular rate and rhythm, no m/r/g Pulmonary/Chest: normal work of breathing on room air, lungs clear to auscultation bilaterally Abdominal: soft, non-tender, non-distended MSK: *** Neurological: alert & oriented x  3, 5/5 strength in bilateral upper and lower extremities, normal gait Skin: warm and dry Psych: ***  Assessment & Plan:   Assessment & Plan   No orders of the defined types were placed in this encounter.  PMH: Right chronic osteomyelitis status post BKA, HFrEF, T2DM, LV thrombus  Discharged from hospital on 9/9 from PCCM in the setting of septic shock due to Candida glabrata fungemia with worsening azotemia with new diagnosis of multiple myeloma Discharged to [facility] per hospice with DNR form    No follow-ups on file.   Patient {[id number]::discussed with,seen with} Dr. {[id number]::Tpoopjfd,Z.  Hoffman,Winfrey,Narendra,Chun,Chambliss,Lau,Machen}  Derek Thompson, D.O. [facility] Internal Medicine, PGY-3 Phone: [phone] Date 01/26/2024 Time 8:20 AM

## 2024-01-31 DEATH — deceased

## 2024-03-04 ENCOUNTER — Encounter: Payer: Self-pay | Admitting: Radiology
